# Patient Record
Sex: Male | Born: 1935 | Race: Black or African American | Hispanic: No | Smoking: Former smoker
Health system: Southern US, Community
[De-identification: ages and names within clinical notes are randomized; demographics above are authoritative.]

## PROBLEM LIST (undated history)

## (undated) DIAGNOSIS — K219 Gastro-esophageal reflux disease without esophagitis: Secondary | ICD-10-CM

## (undated) DIAGNOSIS — M109 Gout, unspecified: Secondary | ICD-10-CM

## (undated) DIAGNOSIS — H409 Unspecified glaucoma: Secondary | ICD-10-CM

## (undated) DIAGNOSIS — R55 Syncope and collapse: Secondary | ICD-10-CM

## (undated) DIAGNOSIS — J82 Pulmonary eosinophilia, not elsewhere classified: Secondary | ICD-10-CM

## (undated) DIAGNOSIS — I69398 Other sequelae of cerebral infarction: Secondary | ICD-10-CM

## (undated) DIAGNOSIS — E785 Hyperlipidemia, unspecified: Secondary | ICD-10-CM

## (undated) DIAGNOSIS — I2510 Atherosclerotic heart disease of native coronary artery without angina pectoris: Secondary | ICD-10-CM

## (undated) DIAGNOSIS — R269 Unspecified abnormalities of gait and mobility: Secondary | ICD-10-CM

## (undated) DIAGNOSIS — G5732 Lesion of lateral popliteal nerve, left lower limb: Secondary | ICD-10-CM

## (undated) DIAGNOSIS — I472 Ventricular tachycardia: Secondary | ICD-10-CM

## (undated) DIAGNOSIS — G5702 Lesion of sciatic nerve, left lower limb: Secondary | ICD-10-CM

## (undated) DIAGNOSIS — I10 Essential (primary) hypertension: Secondary | ICD-10-CM

## (undated) DIAGNOSIS — I69359 Hemiplegia and hemiparesis following cerebral infarction affecting unspecified side: Secondary | ICD-10-CM

## (undated) DIAGNOSIS — I426 Alcoholic cardiomyopathy: Secondary | ICD-10-CM

## (undated) DIAGNOSIS — I4891 Unspecified atrial fibrillation: Secondary | ICD-10-CM

## (undated) DIAGNOSIS — J449 Chronic obstructive pulmonary disease, unspecified: Secondary | ICD-10-CM

## (undated) DIAGNOSIS — I4729 Other ventricular tachycardia: Secondary | ICD-10-CM

## (undated) DIAGNOSIS — R001 Bradycardia, unspecified: Secondary | ICD-10-CM

## (undated) DIAGNOSIS — I480 Paroxysmal atrial fibrillation: Secondary | ICD-10-CM

## (undated) DIAGNOSIS — I509 Heart failure, unspecified: Secondary | ICD-10-CM

## (undated) DIAGNOSIS — I4821 Permanent atrial fibrillation: Secondary | ICD-10-CM

## (undated) DIAGNOSIS — I639 Cerebral infarction, unspecified: Secondary | ICD-10-CM

## (undated) DIAGNOSIS — I5022 Chronic systolic (congestive) heart failure: Secondary | ICD-10-CM

## (undated) DIAGNOSIS — J8289 Other pulmonary eosinophilia, not elsewhere classified: Secondary | ICD-10-CM

## (undated) DIAGNOSIS — M21372 Foot drop, left foot: Principal | ICD-10-CM

## (undated) DIAGNOSIS — Z7901 Long term (current) use of anticoagulants: Secondary | ICD-10-CM

## (undated) HISTORY — DX: Foot drop, left foot: M21.372

## (undated) HISTORY — DX: Gastro-esophageal reflux disease without esophagitis: K21.9

## (undated) HISTORY — DX: Heart failure, unspecified: I50.9

## (undated) HISTORY — DX: Atherosclerotic heart disease of native coronary artery without angina pectoris: I25.10

## (undated) HISTORY — DX: Unspecified glaucoma: H40.9

## (undated) HISTORY — DX: Essential (primary) hypertension: I10

## (undated) HISTORY — DX: Bradycardia, unspecified: R00.1

## (undated) HISTORY — DX: Other ventricular tachycardia: I47.29

## (undated) HISTORY — DX: Permanent atrial fibrillation: I48.21

## (undated) HISTORY — DX: Syncope and collapse: R55

## (undated) HISTORY — DX: Unspecified atrial fibrillation: I48.91

## (undated) HISTORY — DX: Ventricular tachycardia: I47.2

## (undated) HISTORY — DX: Other pulmonary eosinophilia, not elsewhere classified: J82.89

## (undated) HISTORY — DX: Lesion of lateral popliteal nerve, left lower limb: G57.32

## (undated) HISTORY — DX: Lesion of sciatic nerve, left lower limb: G57.02

## (undated) HISTORY — PX: COLONOSCOPY: SHX174

## (undated) HISTORY — DX: Long term (current) use of anticoagulants: Z79.01

## (undated) HISTORY — DX: Other sequelae of cerebral infarction: I69.398

## (undated) HISTORY — DX: Chronic systolic (congestive) heart failure: I50.22

## (undated) HISTORY — DX: Chronic obstructive pulmonary disease, unspecified: J44.9

## (undated) HISTORY — DX: Alcoholic cardiomyopathy: I42.6

## (undated) HISTORY — PX: KNEE SURGERY: SHX244

## (undated) HISTORY — DX: Hemiplegia and hemiparesis following cerebral infarction affecting unspecified side: I69.359

## (undated) HISTORY — DX: Cerebral infarction, unspecified: I63.9

## (undated) HISTORY — DX: Pulmonary eosinophilia, not elsewhere classified: J82

## (undated) HISTORY — DX: Unspecified abnormalities of gait and mobility: R26.9

## (undated) HISTORY — DX: Hyperlipidemia, unspecified: E78.5

---
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
## 2001-09-27 ENCOUNTER — Encounter: Payer: Self-pay | Admitting: Pulmonary Disease

## 2001-09-27 ENCOUNTER — Encounter: Payer: Self-pay | Admitting: Emergency Medicine

## 2001-09-27 ENCOUNTER — Encounter: Payer: Self-pay | Admitting: Neurology

## 2001-09-27 ENCOUNTER — Inpatient Hospital Stay (HOSPITAL_COMMUNITY): Admission: EM | Admit: 2001-09-27 | Discharge: 2001-10-10 | Payer: Self-pay | Admitting: Emergency Medicine

## 2001-09-28 ENCOUNTER — Encounter: Payer: Self-pay | Admitting: Neurology

## 2001-10-02 ENCOUNTER — Encounter: Payer: Self-pay | Admitting: Neurology

## 2001-10-08 ENCOUNTER — Encounter: Payer: Self-pay | Admitting: Neurology

## 2001-10-09 ENCOUNTER — Encounter: Payer: Self-pay | Admitting: Neurology

## 2001-10-10 ENCOUNTER — Inpatient Hospital Stay (HOSPITAL_COMMUNITY)
Admission: RE | Admit: 2001-10-10 | Discharge: 2001-10-26 | Payer: Self-pay | Admitting: Physical Medicine & Rehabilitation

## 2001-10-18 ENCOUNTER — Encounter: Payer: Self-pay | Admitting: Physical Medicine & Rehabilitation

## 2001-10-31 ENCOUNTER — Encounter
Admission: RE | Admit: 2001-10-31 | Discharge: 2001-11-26 | Payer: Self-pay | Admitting: Physical Medicine & Rehabilitation

## 2001-11-21 ENCOUNTER — Encounter: Payer: Self-pay | Admitting: *Deleted

## 2001-11-21 ENCOUNTER — Inpatient Hospital Stay (HOSPITAL_COMMUNITY): Admission: EM | Admit: 2001-11-21 | Discharge: 2001-11-28 | Payer: Self-pay | Admitting: *Deleted

## 2002-02-12 ENCOUNTER — Encounter: Payer: Self-pay | Admitting: Emergency Medicine

## 2002-02-12 ENCOUNTER — Inpatient Hospital Stay (HOSPITAL_COMMUNITY): Admission: EM | Admit: 2002-02-12 | Discharge: 2002-02-17 | Payer: Self-pay | Admitting: Emergency Medicine

## 2002-03-06 ENCOUNTER — Emergency Department (HOSPITAL_COMMUNITY): Admission: EM | Admit: 2002-03-06 | Discharge: 2002-03-06 | Payer: Self-pay | Admitting: Emergency Medicine

## 2002-03-06 ENCOUNTER — Encounter: Payer: Self-pay | Admitting: Emergency Medicine

## 2002-12-28 ENCOUNTER — Encounter: Payer: Self-pay | Admitting: Emergency Medicine

## 2002-12-28 ENCOUNTER — Inpatient Hospital Stay (HOSPITAL_COMMUNITY): Admission: EM | Admit: 2002-12-28 | Discharge: 2003-01-03 | Payer: Self-pay | Admitting: Emergency Medicine

## 2003-02-20 ENCOUNTER — Emergency Department (HOSPITAL_COMMUNITY): Admission: EM | Admit: 2003-02-20 | Discharge: 2003-02-20 | Payer: Self-pay | Admitting: Emergency Medicine

## 2003-02-20 ENCOUNTER — Encounter: Payer: Self-pay | Admitting: Emergency Medicine

## 2003-11-18 ENCOUNTER — Encounter: Admission: RE | Admit: 2003-11-18 | Discharge: 2003-11-18 | Payer: Self-pay | Admitting: Family Medicine

## 2003-11-20 ENCOUNTER — Emergency Department (HOSPITAL_COMMUNITY): Admission: EM | Admit: 2003-11-20 | Discharge: 2003-11-20 | Payer: Self-pay | Admitting: Emergency Medicine

## 2003-11-28 ENCOUNTER — Encounter: Admission: RE | Admit: 2003-11-28 | Discharge: 2003-12-11 | Payer: Self-pay | Admitting: Family Medicine

## 2003-12-05 ENCOUNTER — Emergency Department (HOSPITAL_COMMUNITY): Admission: EM | Admit: 2003-12-05 | Discharge: 2003-12-06 | Payer: Self-pay | Admitting: *Deleted

## 2004-01-23 ENCOUNTER — Inpatient Hospital Stay (HOSPITAL_COMMUNITY): Admission: EM | Admit: 2004-01-23 | Discharge: 2004-01-25 | Payer: Self-pay | Admitting: Emergency Medicine

## 2004-05-24 ENCOUNTER — Emergency Department (HOSPITAL_COMMUNITY): Admission: EM | Admit: 2004-05-24 | Discharge: 2004-05-24 | Payer: Self-pay | Admitting: Emergency Medicine

## 2004-07-07 ENCOUNTER — Ambulatory Visit: Payer: Self-pay | Admitting: Cardiology

## 2004-07-21 ENCOUNTER — Ambulatory Visit: Payer: Self-pay | Admitting: Cardiology

## 2004-07-27 ENCOUNTER — Ambulatory Visit: Payer: Self-pay | Admitting: Cardiology

## 2004-08-04 ENCOUNTER — Ambulatory Visit: Payer: Self-pay | Admitting: *Deleted

## 2004-08-25 ENCOUNTER — Ambulatory Visit: Payer: Self-pay | Admitting: Internal Medicine

## 2004-09-15 ENCOUNTER — Ambulatory Visit: Payer: Self-pay | Admitting: *Deleted

## 2004-10-13 ENCOUNTER — Ambulatory Visit: Payer: Self-pay | Admitting: Cardiovascular Disease

## 2004-10-27 ENCOUNTER — Ambulatory Visit: Payer: Self-pay | Admitting: Cardiology

## 2004-11-10 ENCOUNTER — Ambulatory Visit: Payer: Self-pay | Admitting: *Deleted

## 2004-11-17 ENCOUNTER — Ambulatory Visit: Payer: Self-pay | Admitting: Cardiology

## 2004-12-01 ENCOUNTER — Ambulatory Visit: Payer: Self-pay | Admitting: Cardiology

## 2004-12-15 ENCOUNTER — Ambulatory Visit: Payer: Self-pay | Admitting: Internal Medicine

## 2005-01-05 ENCOUNTER — Ambulatory Visit: Payer: Self-pay | Admitting: Cardiology

## 2005-02-02 ENCOUNTER — Ambulatory Visit: Payer: Self-pay | Admitting: Cardiovascular Disease

## 2005-02-23 ENCOUNTER — Ambulatory Visit: Payer: Self-pay | Admitting: Cardiology

## 2005-03-13 ENCOUNTER — Inpatient Hospital Stay (HOSPITAL_COMMUNITY): Admission: EM | Admit: 2005-03-13 | Discharge: 2005-03-17 | Payer: Self-pay | Admitting: Emergency Medicine

## 2005-03-13 ENCOUNTER — Ambulatory Visit: Payer: Self-pay | Admitting: Cardiology

## 2005-03-22 ENCOUNTER — Ambulatory Visit: Payer: Self-pay | Admitting: Cardiology

## 2005-03-29 ENCOUNTER — Ambulatory Visit: Payer: Self-pay | Admitting: Internal Medicine

## 2005-03-30 ENCOUNTER — Ambulatory Visit: Payer: Self-pay | Admitting: Cardiology

## 2005-04-13 ENCOUNTER — Ambulatory Visit: Payer: Self-pay | Admitting: Cardiology

## 2005-04-27 ENCOUNTER — Ambulatory Visit: Payer: Self-pay | Admitting: *Deleted

## 2005-05-11 ENCOUNTER — Ambulatory Visit: Payer: Self-pay | Admitting: Cardiology

## 2005-05-26 ENCOUNTER — Ambulatory Visit: Payer: Self-pay | Admitting: Cardiology

## 2005-06-23 ENCOUNTER — Ambulatory Visit: Payer: Self-pay | Admitting: Internal Medicine

## 2005-07-06 ENCOUNTER — Ambulatory Visit: Payer: Self-pay | Admitting: Cardiology

## 2005-07-20 ENCOUNTER — Ambulatory Visit: Payer: Self-pay | Admitting: Cardiology

## 2005-08-04 ENCOUNTER — Ambulatory Visit: Payer: Self-pay | Admitting: Cardiology

## 2005-08-25 ENCOUNTER — Ambulatory Visit: Payer: Self-pay | Admitting: *Deleted

## 2005-09-23 ENCOUNTER — Ambulatory Visit: Payer: Self-pay | Admitting: Cardiology

## 2005-09-29 ENCOUNTER — Ambulatory Visit: Payer: Self-pay | Admitting: Cardiology

## 2005-10-13 ENCOUNTER — Ambulatory Visit: Payer: Self-pay | Admitting: Internal Medicine

## 2005-10-25 ENCOUNTER — Ambulatory Visit: Payer: Self-pay | Admitting: Cardiology

## 2005-10-27 ENCOUNTER — Ambulatory Visit: Payer: Self-pay | Admitting: Internal Medicine

## 2005-11-11 ENCOUNTER — Ambulatory Visit: Payer: Self-pay | Admitting: Internal Medicine

## 2005-11-25 ENCOUNTER — Ambulatory Visit: Payer: Self-pay | Admitting: Cardiology

## 2005-12-09 ENCOUNTER — Ambulatory Visit: Payer: Self-pay | Admitting: Internal Medicine

## 2005-12-30 ENCOUNTER — Ambulatory Visit: Payer: Self-pay | Admitting: Cardiology

## 2006-01-27 ENCOUNTER — Ambulatory Visit: Payer: Self-pay | Admitting: Cardiology

## 2006-02-23 ENCOUNTER — Ambulatory Visit: Payer: Self-pay | Admitting: Cardiology

## 2006-03-16 ENCOUNTER — Ambulatory Visit: Payer: Self-pay | Admitting: Cardiology

## 2006-03-29 ENCOUNTER — Ambulatory Visit: Payer: Self-pay | Admitting: Cardiology

## 2006-04-13 ENCOUNTER — Ambulatory Visit: Payer: Self-pay | Admitting: Cardiology

## 2006-04-27 ENCOUNTER — Ambulatory Visit: Payer: Self-pay | Admitting: Cardiology

## 2006-05-11 ENCOUNTER — Ambulatory Visit: Payer: Self-pay | Admitting: Cardiology

## 2006-05-25 ENCOUNTER — Ambulatory Visit: Payer: Self-pay | Admitting: Cardiology

## 2006-06-15 ENCOUNTER — Ambulatory Visit: Payer: Self-pay | Admitting: Internal Medicine

## 2006-06-22 ENCOUNTER — Ambulatory Visit: Payer: Self-pay | Admitting: Cardiology

## 2006-07-05 ENCOUNTER — Ambulatory Visit: Payer: Self-pay | Admitting: Cardiology

## 2006-07-19 ENCOUNTER — Ambulatory Visit: Payer: Self-pay | Admitting: Cardiology

## 2006-08-04 ENCOUNTER — Ambulatory Visit: Payer: Self-pay | Admitting: Cardiology

## 2006-08-08 ENCOUNTER — Emergency Department (HOSPITAL_COMMUNITY): Admission: EM | Admit: 2006-08-08 | Discharge: 2006-08-08 | Payer: Self-pay | Admitting: Emergency Medicine

## 2006-08-18 ENCOUNTER — Ambulatory Visit: Payer: Self-pay | Admitting: Cardiovascular Disease

## 2006-09-11 ENCOUNTER — Ambulatory Visit: Payer: Self-pay | Admitting: Internal Medicine

## 2006-10-06 ENCOUNTER — Emergency Department (HOSPITAL_COMMUNITY): Admission: EM | Admit: 2006-10-06 | Discharge: 2006-10-07 | Payer: Self-pay | Admitting: Emergency Medicine

## 2006-10-10 ENCOUNTER — Ambulatory Visit: Payer: Self-pay | Admitting: Cardiology

## 2006-10-13 ENCOUNTER — Emergency Department (HOSPITAL_COMMUNITY): Admission: EM | Admit: 2006-10-13 | Discharge: 2006-10-13 | Payer: Self-pay | Admitting: Emergency Medicine

## 2006-11-07 ENCOUNTER — Ambulatory Visit: Payer: Self-pay | Admitting: Cardiology

## 2006-11-09 ENCOUNTER — Encounter: Admission: RE | Admit: 2006-11-09 | Discharge: 2006-11-09 | Payer: Self-pay | Admitting: Family Medicine

## 2006-11-14 ENCOUNTER — Encounter: Admission: RE | Admit: 2006-11-14 | Discharge: 2006-11-14 | Payer: Self-pay | Admitting: Family Medicine

## 2006-11-16 ENCOUNTER — Ambulatory Visit: Payer: Self-pay | Admitting: Internal Medicine

## 2006-12-05 ENCOUNTER — Ambulatory Visit: Payer: Self-pay | Admitting: Cardiology

## 2006-12-20 ENCOUNTER — Ambulatory Visit: Payer: Self-pay | Admitting: Internal Medicine

## 2007-01-02 ENCOUNTER — Ambulatory Visit: Payer: Self-pay | Admitting: Cardiology

## 2007-01-26 ENCOUNTER — Ambulatory Visit: Payer: Self-pay | Admitting: Cardiology

## 2007-02-23 ENCOUNTER — Ambulatory Visit: Payer: Self-pay | Admitting: Cardiology

## 2007-03-06 ENCOUNTER — Ambulatory Visit: Payer: Self-pay | Admitting: Cardiology

## 2007-03-20 ENCOUNTER — Ambulatory Visit: Payer: Self-pay | Admitting: Internal Medicine

## 2007-03-30 ENCOUNTER — Ambulatory Visit: Payer: Self-pay | Admitting: Cardiology

## 2007-04-04 ENCOUNTER — Ambulatory Visit: Payer: Self-pay | Admitting: Cardiology

## 2007-04-18 ENCOUNTER — Ambulatory Visit: Payer: Self-pay | Admitting: Cardiology

## 2007-05-04 ENCOUNTER — Ambulatory Visit: Payer: Self-pay

## 2007-05-04 ENCOUNTER — Ambulatory Visit: Payer: Self-pay | Admitting: Cardiology

## 2007-05-04 ENCOUNTER — Encounter: Payer: Self-pay | Admitting: Cardiology

## 2007-05-04 LAB — CONVERTED CEMR LAB
ALT: 13 units/L (ref 0–53)
AST: 17 units/L (ref 0–37)
Albumin: 3.8 g/dL (ref 3.5–5.2)
Alkaline Phosphatase: 59 units/L (ref 39–117)
BUN: 12 mg/dL (ref 6–23)
Bilirubin, Direct: 0.1 mg/dL (ref 0.0–0.3)
CO2: 30 meq/L (ref 19–32)
Calcium: 8.8 mg/dL (ref 8.4–10.5)
Chloride: 103 meq/L (ref 96–112)
Cholesterol: 155 mg/dL (ref 0–200)
Creatinine, Ser: 1.1 mg/dL (ref 0.4–1.5)
GFR calc Af Amer: 85 mL/min
GFR calc non Af Amer: 71 mL/min
Glucose, Bld: 110 mg/dL — ABNORMAL HIGH (ref 70–99)
HDL: 48.5 mg/dL (ref >39.0)
LDL Cholesterol: 90 mg/dL (ref 0–99)
Potassium: 4.3 meq/L (ref 3.5–5.1)
Sodium: 138 meq/L (ref 135–145)
Total Bilirubin: 1.2 mg/dL (ref 0.3–1.2)
Total CHOL/HDL Ratio: 3.2
Total Protein: 6.9 g/dL (ref 6.0–8.3)
Triglycerides: 84 mg/dL (ref 0–149)
VLDL: 17 mg/dL (ref 0–40)

## 2007-05-09 ENCOUNTER — Ambulatory Visit: Payer: Self-pay | Admitting: Cardiology

## 2007-05-17 ENCOUNTER — Ambulatory Visit: Payer: Self-pay | Admitting: Cardiology

## 2007-05-17 ENCOUNTER — Observation Stay (HOSPITAL_COMMUNITY): Admission: EM | Admit: 2007-05-17 | Discharge: 2007-05-18 | Payer: Self-pay | Admitting: Emergency Medicine

## 2007-05-23 ENCOUNTER — Ambulatory Visit: Payer: Self-pay | Admitting: Internal Medicine

## 2007-05-23 ENCOUNTER — Ambulatory Visit: Payer: Self-pay | Admitting: Cardiology

## 2007-06-08 ENCOUNTER — Ambulatory Visit: Payer: Self-pay | Admitting: Cardiology

## 2007-07-04 ENCOUNTER — Ambulatory Visit: Payer: Self-pay | Admitting: Cardiology

## 2007-07-18 ENCOUNTER — Ambulatory Visit: Payer: Self-pay | Admitting: Cardiology

## 2007-08-14 ENCOUNTER — Ambulatory Visit: Payer: Self-pay | Admitting: Internal Medicine

## 2007-09-11 ENCOUNTER — Ambulatory Visit: Payer: Self-pay | Admitting: Cardiology

## 2007-09-30 HISTORY — PX: OTHER SURGICAL HISTORY: SHX169

## 2007-10-09 ENCOUNTER — Ambulatory Visit: Payer: Self-pay | Admitting: Internal Medicine

## 2007-10-17 ENCOUNTER — Ambulatory Visit: Payer: Self-pay | Admitting: Cardiology

## 2007-10-17 ENCOUNTER — Inpatient Hospital Stay (HOSPITAL_COMMUNITY): Admission: EM | Admit: 2007-10-17 | Discharge: 2007-10-19 | Payer: Self-pay | Admitting: Emergency Medicine

## 2007-10-18 ENCOUNTER — Ambulatory Visit: Payer: Self-pay | Admitting: Vascular Surgery

## 2007-10-18 ENCOUNTER — Encounter: Payer: Self-pay | Admitting: Cardiology

## 2007-11-05 ENCOUNTER — Ambulatory Visit: Payer: Self-pay

## 2007-11-06 ENCOUNTER — Ambulatory Visit: Payer: Self-pay | Admitting: Cardiovascular Disease

## 2007-11-23 ENCOUNTER — Ambulatory Visit: Payer: Self-pay | Admitting: Cardiology

## 2007-11-27 ENCOUNTER — Ambulatory Visit: Payer: Self-pay | Admitting: Cardiovascular Disease

## 2007-12-11 ENCOUNTER — Ambulatory Visit: Payer: Self-pay | Admitting: Cardiology

## 2007-12-20 ENCOUNTER — Ambulatory Visit: Payer: Self-pay | Admitting: Internal Medicine

## 2007-12-26 ENCOUNTER — Telehealth: Payer: Self-pay | Admitting: Internal Medicine

## 2007-12-27 DIAGNOSIS — R05 Cough: Secondary | ICD-10-CM

## 2007-12-27 DIAGNOSIS — I2510 Atherosclerotic heart disease of native coronary artery without angina pectoris: Secondary | ICD-10-CM | POA: Insufficient documentation

## 2007-12-27 DIAGNOSIS — E785 Hyperlipidemia, unspecified: Secondary | ICD-10-CM | POA: Insufficient documentation

## 2007-12-27 DIAGNOSIS — I6350 Cerebral infarction due to unspecified occlusion or stenosis of unspecified cerebral artery: Secondary | ICD-10-CM | POA: Insufficient documentation

## 2007-12-27 DIAGNOSIS — I10 Essential (primary) hypertension: Secondary | ICD-10-CM | POA: Insufficient documentation

## 2007-12-27 DIAGNOSIS — I4819 Other persistent atrial fibrillation: Secondary | ICD-10-CM | POA: Insufficient documentation

## 2007-12-27 DIAGNOSIS — R059 Cough, unspecified: Secondary | ICD-10-CM | POA: Insufficient documentation

## 2007-12-27 DIAGNOSIS — I4891 Unspecified atrial fibrillation: Secondary | ICD-10-CM | POA: Insufficient documentation

## 2007-12-27 DIAGNOSIS — I426 Alcoholic cardiomyopathy: Secondary | ICD-10-CM | POA: Insufficient documentation

## 2007-12-28 ENCOUNTER — Ambulatory Visit: Payer: Self-pay | Admitting: Internal Medicine

## 2007-12-28 DIAGNOSIS — J8289 Other pulmonary eosinophilia, not elsewhere classified: Secondary | ICD-10-CM | POA: Insufficient documentation

## 2007-12-28 DIAGNOSIS — J82 Pulmonary eosinophilia, not elsewhere classified: Secondary | ICD-10-CM

## 2008-01-10 ENCOUNTER — Ambulatory Visit: Payer: Self-pay | Admitting: Cardiology

## 2008-02-05 ENCOUNTER — Ambulatory Visit: Payer: Self-pay | Admitting: Internal Medicine

## 2008-02-07 ENCOUNTER — Ambulatory Visit: Payer: Self-pay | Admitting: Cardiology

## 2008-02-21 ENCOUNTER — Ambulatory Visit: Payer: Self-pay | Admitting: Cardiology

## 2008-02-25 ENCOUNTER — Ambulatory Visit: Payer: Self-pay | Admitting: Cardiology

## 2008-02-28 ENCOUNTER — Ambulatory Visit: Payer: Self-pay | Admitting: Cardiology

## 2008-03-20 ENCOUNTER — Ambulatory Visit: Payer: Self-pay | Admitting: Cardiology

## 2008-04-10 ENCOUNTER — Ambulatory Visit: Payer: Self-pay | Admitting: Cardiology

## 2008-04-15 ENCOUNTER — Inpatient Hospital Stay (HOSPITAL_COMMUNITY): Admission: EM | Admit: 2008-04-15 | Discharge: 2008-04-21 | Payer: Self-pay | Admitting: Emergency Medicine

## 2008-04-15 ENCOUNTER — Ambulatory Visit: Payer: Self-pay | Admitting: Internal Medicine

## 2008-04-18 ENCOUNTER — Ambulatory Visit: Payer: Self-pay | Admitting: Physical Medicine & Rehabilitation

## 2008-04-21 ENCOUNTER — Ambulatory Visit: Payer: Self-pay | Admitting: Physical Medicine & Rehabilitation

## 2008-04-21 ENCOUNTER — Inpatient Hospital Stay (HOSPITAL_COMMUNITY)
Admission: RE | Admit: 2008-04-21 | Discharge: 2008-04-30 | Payer: Self-pay | Admitting: Physical Medicine & Rehabilitation

## 2008-05-19 ENCOUNTER — Ambulatory Visit: Payer: Self-pay | Admitting: Cardiovascular Disease

## 2008-05-21 ENCOUNTER — Ambulatory Visit: Payer: Self-pay

## 2008-05-21 ENCOUNTER — Ambulatory Visit: Payer: Self-pay | Admitting: Cardiology

## 2008-05-26 ENCOUNTER — Ambulatory Visit: Payer: Self-pay | Admitting: Cardiology

## 2008-05-26 LAB — CONVERTED CEMR LAB
BUN: 10 mg/dL (ref 6–23)
CO2: 28 meq/L (ref 19–32)
Calcium: 8.9 mg/dL (ref 8.4–10.5)
Chloride: 102 meq/L (ref 96–112)
Creatinine, Ser: 1 mg/dL (ref 0.4–1.5)
GFR calc Af Amer: 95 mL/min
GFR calc non Af Amer: 79 mL/min
Glucose, Bld: 90 mg/dL (ref 70–99)
Magnesium: 2.2 mg/dL (ref 1.5–2.5)
Potassium: 4 meq/L (ref 3.5–5.1)
Sodium: 141 meq/L (ref 135–145)
TSH: 1.05 microintl units/mL (ref 0.35–5.50)

## 2008-06-10 ENCOUNTER — Ambulatory Visit (HOSPITAL_COMMUNITY): Admission: RE | Admit: 2008-06-10 | Discharge: 2008-06-10 | Payer: Self-pay | Admitting: Cardiovascular Disease

## 2008-06-10 ENCOUNTER — Ambulatory Visit: Payer: Self-pay | Admitting: Cardiovascular Disease

## 2008-06-16 ENCOUNTER — Ambulatory Visit: Payer: Self-pay | Admitting: Internal Medicine

## 2008-06-25 ENCOUNTER — Ambulatory Visit: Payer: Self-pay | Admitting: Internal Medicine

## 2008-07-15 ENCOUNTER — Ambulatory Visit: Payer: Self-pay | Admitting: Cardiology

## 2008-07-17 ENCOUNTER — Encounter: Admission: RE | Admit: 2008-07-17 | Discharge: 2008-08-14 | Payer: Self-pay | Admitting: Orthopedic Surgery

## 2008-08-05 ENCOUNTER — Ambulatory Visit: Payer: Self-pay | Admitting: Internal Medicine

## 2008-08-19 ENCOUNTER — Ambulatory Visit: Payer: Self-pay | Admitting: Internal Medicine

## 2008-09-09 ENCOUNTER — Ambulatory Visit: Payer: Self-pay | Admitting: Cardiovascular Disease

## 2008-09-19 ENCOUNTER — Ambulatory Visit: Payer: Self-pay | Admitting: Internal Medicine

## 2008-09-22 IMAGING — CT CT HEAD W/O CM
1 of 2 series · 13 of 30 positions shown, 17 images · IV contrast (agent unspecified)
Comparison: 01/23/04

CLINICAL DATA: Vertigo and unsteady gait.  History of prior right cerebral infarction.
 HEAD CT WITHOUT CONTRAST:
TECHNIQUE: Contiguous axial images were obtained from the base of the skull through the vertex according to standard protocol without contrast.

[Series 2: brain · axial · 0.47mm/px · z∈[+124,+250]mm · 13 of 28 slices shown, 17 images]
[im 2/28  brain]
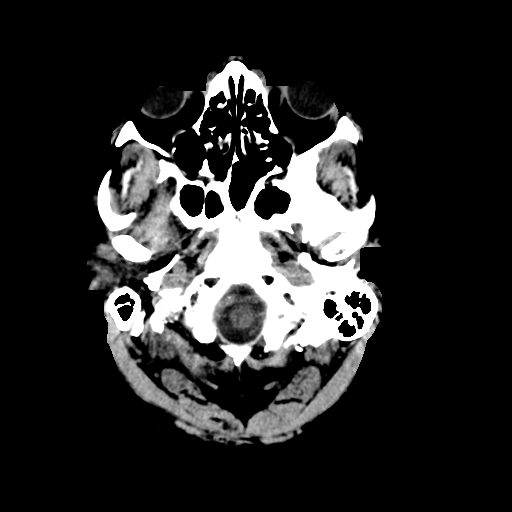
[im 2/28  bone]
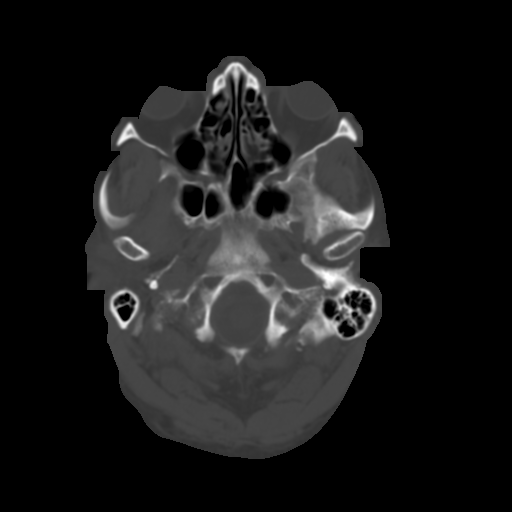
[im 4/28  brain]
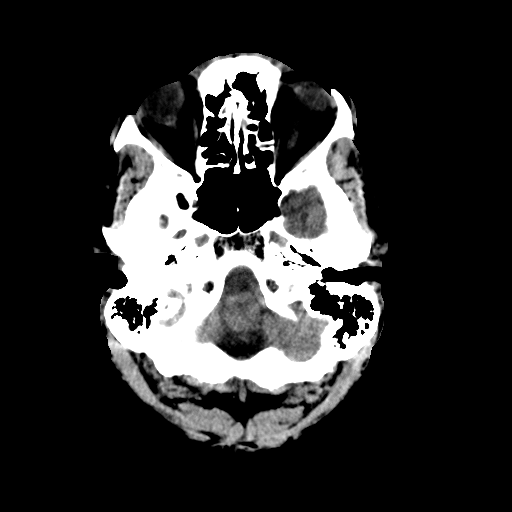
[im 6/28  brain]
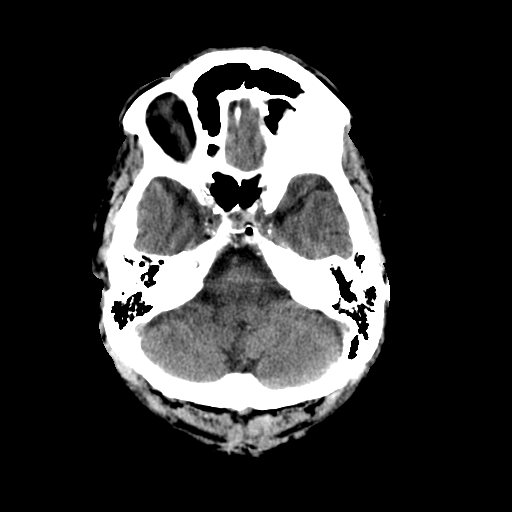
[im 8/28  brain]
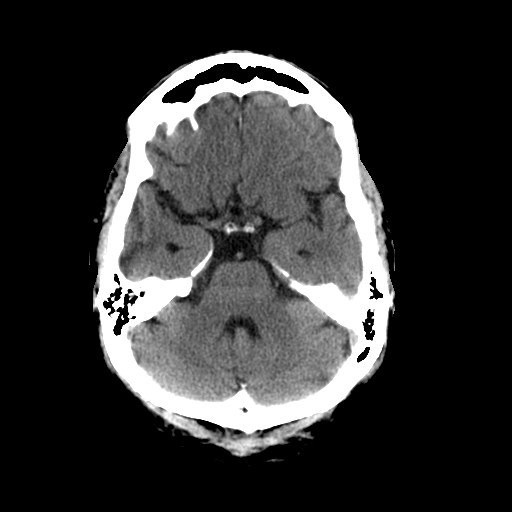
[im 10/28  brain]
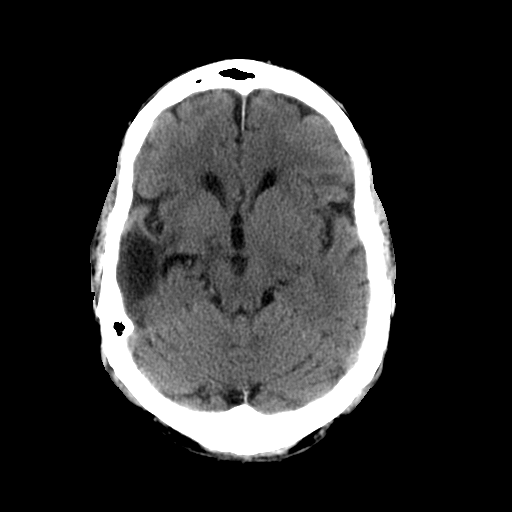
[im 10/28  bone]
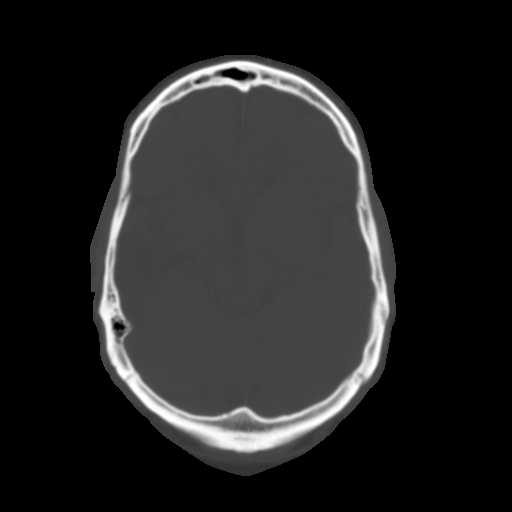
[im 12/28  brain]
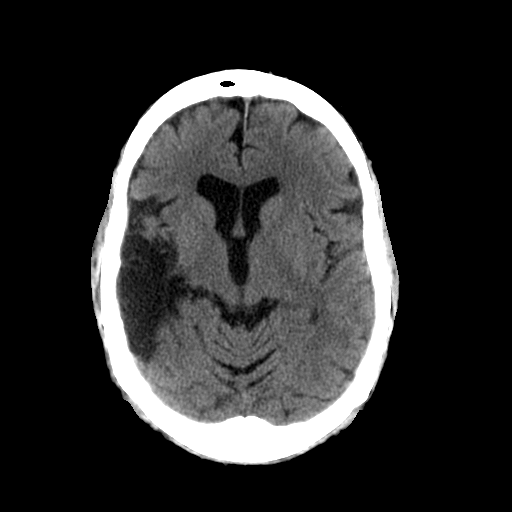
[im 14/28  brain]
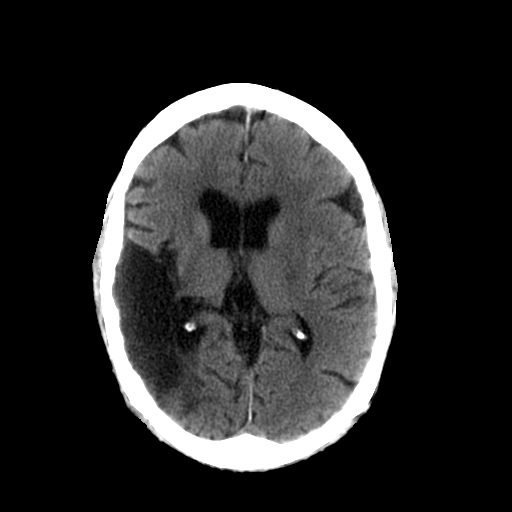
[im 16/28  brain]
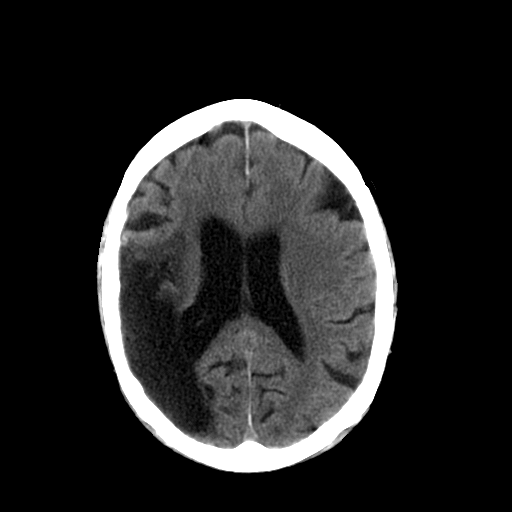
[im 18/28  brain]
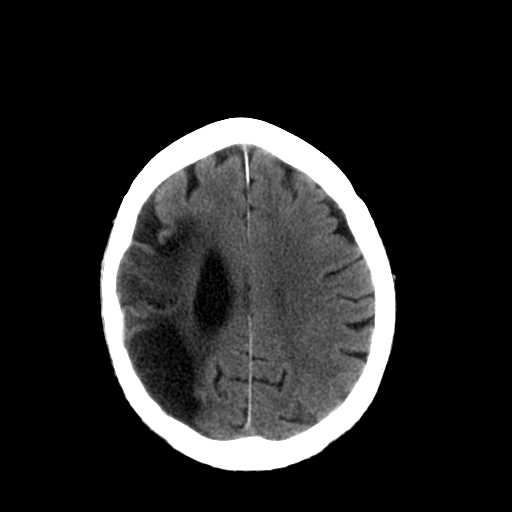
[im 18/28  bone]
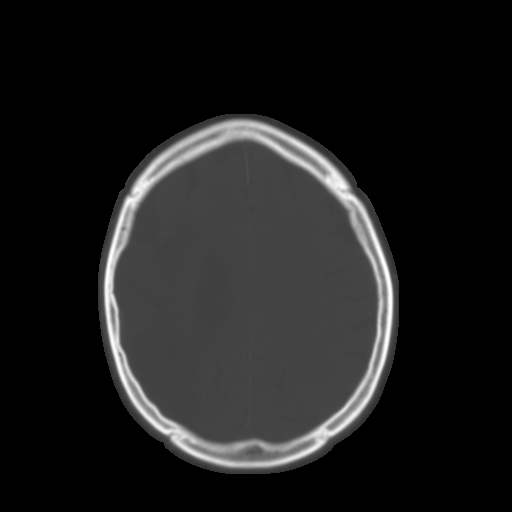
[im 20/28  brain]
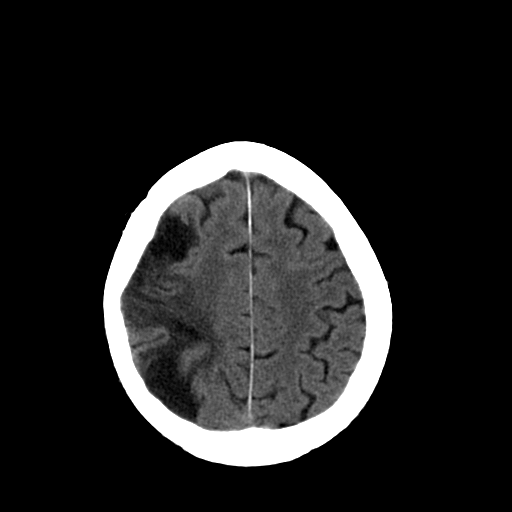
[im 22/28  brain]
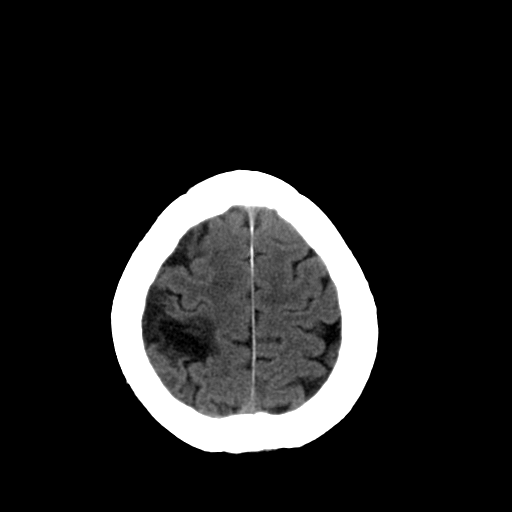
[im 24/28  brain]
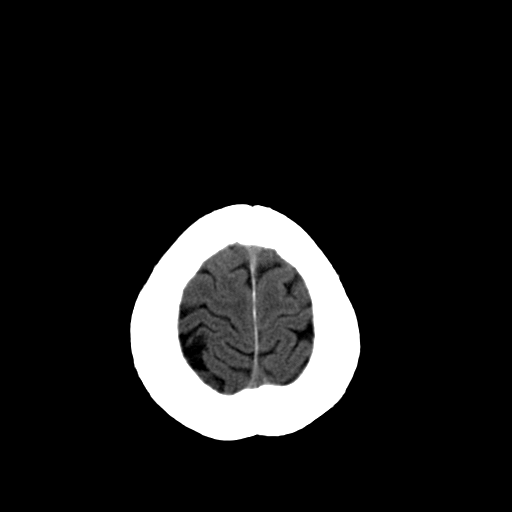
[im 26/28  brain]
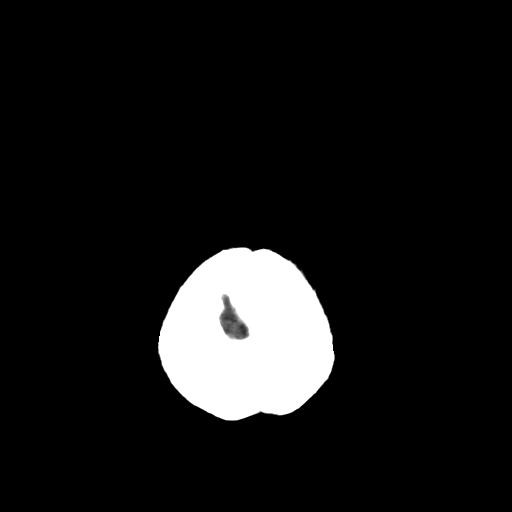
[im 26/28  bone]
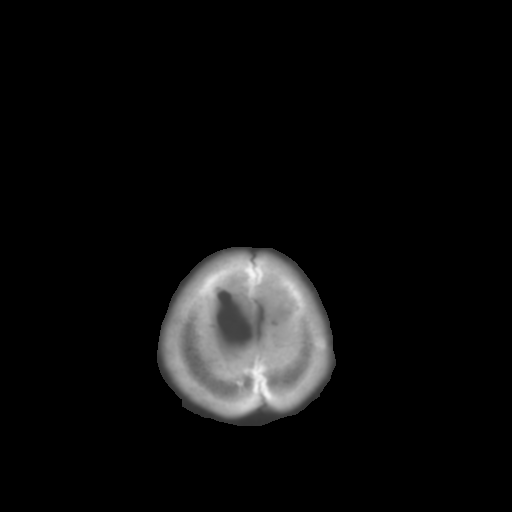

[13 of 30 positions shown; findings below may reference images not displayed]

FINDINGS: There is stable appearance to an old infarct with encephalomalacia seen throughout a large distribution of the right MCA territory involving frontal, parietal, and temporal cortex.  No new infarcts are seen.  No evidence of acute hemorrhage.  No mass effect.  No hydrocephalus.  No extra-axial fluid collections.  Bone windows show normal appearance to the skull.
IMPRESSION: Stable old large right MCA territory infarct.  No acute findings.

## 2008-09-29 ENCOUNTER — Ambulatory Visit: Payer: Self-pay | Admitting: Internal Medicine

## 2008-10-10 ENCOUNTER — Ambulatory Visit: Payer: Self-pay | Admitting: Internal Medicine

## 2008-10-31 ENCOUNTER — Ambulatory Visit: Payer: Self-pay | Admitting: Internal Medicine

## 2008-11-05 ENCOUNTER — Ambulatory Visit: Payer: Self-pay | Admitting: Cardiology

## 2008-11-19 ENCOUNTER — Ambulatory Visit: Payer: Self-pay | Admitting: Internal Medicine

## 2008-11-19 LAB — CONVERTED CEMR LAB
INR: 6.6 (ref 0.8–1.0)
Prothrombin Time: 64.5 s (ref 10.9–13.3)

## 2008-12-01 ENCOUNTER — Ambulatory Visit: Payer: Self-pay | Admitting: Cardiology

## 2008-12-09 ENCOUNTER — Encounter: Payer: Self-pay | Admitting: Cardiology

## 2008-12-09 ENCOUNTER — Ambulatory Visit: Payer: Self-pay | Admitting: Cardiology

## 2008-12-10 LAB — CONVERTED CEMR LAB
ALT: 16 units/L (ref 0–53)
AST: 22 units/L (ref 0–37)
Albumin: 4.1 g/dL (ref 3.5–5.2)
Alkaline Phosphatase: 70 units/L (ref 39–117)
BUN: 11 mg/dL (ref 6–23)
Bilirubin, Direct: 0.2 mg/dL (ref 0.0–0.3)
CO2: 30 meq/L (ref 19–32)
Calcium: 9 mg/dL (ref 8.4–10.5)
Chloride: 105 meq/L (ref 96–112)
Cholesterol: 183 mg/dL (ref 0–200)
Creatinine, Ser: 1.1 mg/dL (ref 0.4–1.5)
GFR calc non Af Amer: 84.92 mL/min (ref >60)
Glucose, Bld: 90 mg/dL (ref 70–99)
HDL: 52.4 mg/dL (ref >39.00)
LDL Cholesterol: 113 mg/dL — ABNORMAL HIGH (ref 0–99)
Potassium: 3.9 meq/L (ref 3.5–5.1)
Sodium: 141 meq/L (ref 135–145)
Total Bilirubin: 1.3 mg/dL — ABNORMAL HIGH (ref 0.3–1.2)
Total CHOL/HDL Ratio: 3
Total Protein: 7.4 g/dL (ref 6.0–8.3)
Triglycerides: 89 mg/dL (ref 0.0–149.0)
VLDL: 17.8 mg/dL (ref 0.0–40.0)

## 2008-12-30 ENCOUNTER — Ambulatory Visit: Payer: Self-pay | Admitting: Internal Medicine

## 2008-12-30 DIAGNOSIS — R55 Syncope and collapse: Secondary | ICD-10-CM | POA: Insufficient documentation

## 2008-12-30 DIAGNOSIS — I4729 Other ventricular tachycardia: Secondary | ICD-10-CM | POA: Insufficient documentation

## 2008-12-30 DIAGNOSIS — I472 Ventricular tachycardia: Secondary | ICD-10-CM | POA: Insufficient documentation

## 2009-01-20 ENCOUNTER — Ambulatory Visit: Payer: Self-pay | Admitting: Cardiology

## 2009-01-27 ENCOUNTER — Encounter: Payer: Self-pay | Admitting: *Deleted

## 2009-02-03 ENCOUNTER — Ambulatory Visit: Payer: Self-pay | Admitting: Cardiology

## 2009-02-03 LAB — CONVERTED CEMR LAB
POC INR: 3.1
Protime: 21.2

## 2009-02-17 ENCOUNTER — Ambulatory Visit: Payer: Self-pay | Admitting: Cardiovascular Disease

## 2009-02-17 LAB — CONVERTED CEMR LAB
POC INR: 3.1
Protime: 21.2

## 2009-03-04 ENCOUNTER — Encounter: Payer: Self-pay | Admitting: *Deleted

## 2009-03-10 ENCOUNTER — Encounter (INDEPENDENT_AMBULATORY_CARE_PROVIDER_SITE_OTHER): Payer: Self-pay | Admitting: Cardiology

## 2009-03-10 ENCOUNTER — Ambulatory Visit: Payer: Self-pay | Admitting: Cardiovascular Disease

## 2009-03-10 LAB — CONVERTED CEMR LAB
POC INR: 1.1
Prothrombin Time: 13.1 s

## 2009-03-19 ENCOUNTER — Ambulatory Visit: Payer: Self-pay | Admitting: Cardiovascular Disease

## 2009-03-19 LAB — CONVERTED CEMR LAB
POC INR: 1.2
Prothrombin Time: 13.8 s

## 2009-03-25 ENCOUNTER — Ambulatory Visit: Payer: Self-pay | Admitting: Cardiology

## 2009-03-25 LAB — CONVERTED CEMR LAB
POC INR: 2.3
Prothrombin Time: 18.5 s

## 2009-04-08 ENCOUNTER — Ambulatory Visit: Payer: Self-pay | Admitting: Cardiology

## 2009-04-08 LAB — CONVERTED CEMR LAB: POC INR: 3.2

## 2009-04-09 ENCOUNTER — Encounter (INDEPENDENT_AMBULATORY_CARE_PROVIDER_SITE_OTHER): Payer: Self-pay | Admitting: *Deleted

## 2009-04-22 ENCOUNTER — Ambulatory Visit: Payer: Self-pay | Admitting: Cardiovascular Disease

## 2009-05-01 ENCOUNTER — Telehealth (INDEPENDENT_AMBULATORY_CARE_PROVIDER_SITE_OTHER): Payer: Self-pay | Admitting: *Deleted

## 2009-05-13 ENCOUNTER — Ambulatory Visit: Payer: Self-pay | Admitting: Internal Medicine

## 2009-05-13 LAB — CONVERTED CEMR LAB: POC INR: 2.4

## 2009-06-09 ENCOUNTER — Ambulatory Visit: Payer: Self-pay | Admitting: Cardiology

## 2009-07-01 ENCOUNTER — Ambulatory Visit: Payer: Self-pay | Admitting: Cardiovascular Disease

## 2009-07-01 ENCOUNTER — Ambulatory Visit: Payer: Self-pay

## 2009-07-01 ENCOUNTER — Encounter: Payer: Self-pay | Admitting: Internal Medicine

## 2009-07-01 LAB — CONVERTED CEMR LAB: POC INR: 2.1

## 2009-07-22 ENCOUNTER — Ambulatory Visit: Payer: Self-pay | Admitting: Cardiology

## 2009-07-22 LAB — CONVERTED CEMR LAB: POC INR: 2.7

## 2009-08-18 ENCOUNTER — Ambulatory Visit: Payer: Self-pay | Admitting: Cardiology

## 2009-08-18 LAB — CONVERTED CEMR LAB: POC INR: 3.6

## 2009-09-02 ENCOUNTER — Ambulatory Visit: Payer: Self-pay | Admitting: Cardiovascular Disease

## 2009-09-02 LAB — CONVERTED CEMR LAB: POC INR: 1.7

## 2009-09-16 ENCOUNTER — Ambulatory Visit: Payer: Self-pay | Admitting: Cardiovascular Disease

## 2009-09-16 LAB — CONVERTED CEMR LAB: POC INR: 2.9

## 2009-10-06 ENCOUNTER — Ambulatory Visit: Payer: Self-pay | Admitting: Internal Medicine

## 2009-10-06 ENCOUNTER — Ambulatory Visit: Payer: Self-pay | Admitting: Cardiology

## 2009-10-06 LAB — CONVERTED CEMR LAB: POC INR: 2.3

## 2009-11-04 ENCOUNTER — Ambulatory Visit: Payer: Self-pay | Admitting: Cardiovascular Disease

## 2009-11-04 LAB — CONVERTED CEMR LAB: POC INR: 2.3

## 2009-11-27 ENCOUNTER — Telehealth: Payer: Self-pay | Admitting: Cardiology

## 2009-11-27 ENCOUNTER — Ambulatory Visit: Payer: Self-pay | Admitting: Cardiology

## 2009-12-02 ENCOUNTER — Ambulatory Visit: Payer: Self-pay | Admitting: Cardiology

## 2009-12-02 LAB — CONVERTED CEMR LAB: POC INR: 3.6

## 2009-12-08 ENCOUNTER — Encounter: Payer: Self-pay | Admitting: Internal Medicine

## 2009-12-08 LAB — CONVERTED CEMR LAB
ALT: 15 units/L (ref 0–53)
AST: 16 units/L (ref 0–37)
Albumin: 3.8 g/dL (ref 3.5–5.2)
Alkaline Phosphatase: 60 units/L (ref 39–117)
Bilirubin, Direct: 0 mg/dL (ref 0.0–0.3)
Cholesterol: 173 mg/dL (ref 0–200)
HDL: 51.7 mg/dL (ref >39.00)
LDL Cholesterol: 90 mg/dL (ref 0–99)
Total Bilirubin: 0.8 mg/dL (ref 0.3–1.2)
Total CHOL/HDL Ratio: 3
Total Protein: 7 g/dL (ref 6.0–8.3)
Triglycerides: 158 mg/dL — ABNORMAL HIGH (ref 0.0–149.0)
VLDL: 31.6 mg/dL (ref 0.0–40.0)

## 2009-12-15 ENCOUNTER — Ambulatory Visit: Payer: Self-pay | Admitting: Cardiovascular Disease

## 2009-12-17 ENCOUNTER — Telehealth (INDEPENDENT_AMBULATORY_CARE_PROVIDER_SITE_OTHER): Payer: Self-pay | Admitting: *Deleted

## 2009-12-28 ENCOUNTER — Ambulatory Visit: Payer: Self-pay

## 2009-12-28 ENCOUNTER — Encounter: Payer: Self-pay | Admitting: Internal Medicine

## 2010-01-12 ENCOUNTER — Ambulatory Visit: Payer: Self-pay | Admitting: Cardiology

## 2010-01-12 LAB — CONVERTED CEMR LAB: POC INR: 1.8

## 2010-01-20 ENCOUNTER — Telehealth: Payer: Self-pay | Admitting: Cardiology

## 2010-02-09 ENCOUNTER — Ambulatory Visit: Payer: Self-pay | Admitting: Internal Medicine

## 2010-02-09 LAB — CONVERTED CEMR LAB: POC INR: 2

## 2010-03-02 ENCOUNTER — Ambulatory Visit: Payer: Self-pay | Admitting: Cardiology

## 2010-03-02 ENCOUNTER — Ambulatory Visit: Payer: Self-pay | Admitting: Internal Medicine

## 2010-03-02 LAB — CONVERTED CEMR LAB: POC INR: 2.6

## 2010-03-05 ENCOUNTER — Ambulatory Visit: Payer: Self-pay | Admitting: Cardiology

## 2010-03-11 LAB — CONVERTED CEMR LAB
ALT: 12 units/L (ref 0–53)
AST: 17 units/L (ref 0–37)
Albumin: 4 g/dL (ref 3.5–5.2)
Alkaline Phosphatase: 61 units/L (ref 39–117)
Bilirubin, Direct: 0.2 mg/dL (ref 0.0–0.3)
Cholesterol: 199 mg/dL (ref 0–200)
HDL: 46.4 mg/dL (ref >39.00)
LDL Cholesterol: 130 mg/dL — ABNORMAL HIGH (ref 0–99)
Total Bilirubin: 1 mg/dL (ref 0.3–1.2)
Total CHOL/HDL Ratio: 4
Total Protein: 7.4 g/dL (ref 6.0–8.3)
Triglycerides: 113 mg/dL (ref 0.0–149.0)
VLDL: 22.6 mg/dL (ref 0.0–40.0)

## 2010-03-31 ENCOUNTER — Encounter: Payer: Self-pay | Admitting: Internal Medicine

## 2010-03-31 ENCOUNTER — Ambulatory Visit: Payer: Self-pay

## 2010-03-31 ENCOUNTER — Ambulatory Visit: Payer: Self-pay | Admitting: Cardiovascular Disease

## 2010-03-31 LAB — CONVERTED CEMR LAB: POC INR: 2.3

## 2010-04-13 ENCOUNTER — Ambulatory Visit: Payer: Self-pay | Admitting: Internal Medicine

## 2010-04-28 ENCOUNTER — Ambulatory Visit: Payer: Self-pay | Admitting: Cardiovascular Disease

## 2010-04-28 LAB — CONVERTED CEMR LAB: POC INR: 3.5

## 2010-04-29 ENCOUNTER — Telehealth: Payer: Self-pay | Admitting: Internal Medicine

## 2010-05-12 ENCOUNTER — Ambulatory Visit: Payer: Self-pay | Admitting: Internal Medicine

## 2010-05-12 LAB — CONVERTED CEMR LAB: POC INR: 2.3

## 2010-06-09 ENCOUNTER — Ambulatory Visit: Payer: Self-pay | Admitting: Cardiovascular Disease

## 2010-06-09 ENCOUNTER — Ambulatory Visit: Payer: Self-pay | Admitting: Cardiology

## 2010-06-09 DIAGNOSIS — I498 Other specified cardiac arrhythmias: Secondary | ICD-10-CM | POA: Insufficient documentation

## 2010-06-09 LAB — CONVERTED CEMR LAB: POC INR: 3.1

## 2010-06-30 ENCOUNTER — Ambulatory Visit: Payer: Self-pay | Admitting: Internal Medicine

## 2010-07-07 ENCOUNTER — Ambulatory Visit: Payer: Self-pay | Admitting: Cardiovascular Disease

## 2010-07-07 LAB — CONVERTED CEMR LAB: POC INR: 3.5

## 2010-07-26 ENCOUNTER — Ambulatory Visit: Payer: Self-pay | Admitting: Cardiology

## 2010-07-26 LAB — CONVERTED CEMR LAB: POC INR: 1.3

## 2010-08-02 ENCOUNTER — Ambulatory Visit: Payer: Self-pay | Admitting: Cardiovascular Disease

## 2010-08-02 LAB — CONVERTED CEMR LAB: POC INR: 2.5

## 2010-08-05 ENCOUNTER — Encounter (INDEPENDENT_AMBULATORY_CARE_PROVIDER_SITE_OTHER): Payer: Self-pay | Admitting: *Deleted

## 2010-08-17 ENCOUNTER — Ambulatory Visit: Payer: Self-pay | Admitting: Cardiology

## 2010-08-17 LAB — CONVERTED CEMR LAB: POC INR: 3

## 2010-09-13 ENCOUNTER — Ambulatory Visit: Admission: RE | Admit: 2010-09-13 | Discharge: 2010-09-13 | Payer: Self-pay | Source: Home / Self Care

## 2010-09-13 LAB — CONVERTED CEMR LAB: POC INR: 2.6

## 2010-09-17 ENCOUNTER — Encounter: Payer: Self-pay | Admitting: Cardiology

## 2010-09-17 ENCOUNTER — Ambulatory Visit
Admission: RE | Admit: 2010-09-17 | Discharge: 2010-09-17 | Payer: Self-pay | Source: Home / Self Care | Attending: Gastroenterology | Admitting: Gastroenterology

## 2010-09-17 ENCOUNTER — Encounter: Payer: Self-pay | Admitting: Gastroenterology

## 2010-09-17 DIAGNOSIS — H409 Unspecified glaucoma: Secondary | ICD-10-CM | POA: Insufficient documentation

## 2010-09-17 DIAGNOSIS — K625 Hemorrhage of anus and rectum: Secondary | ICD-10-CM | POA: Insufficient documentation

## 2010-09-17 DIAGNOSIS — K219 Gastro-esophageal reflux disease without esophagitis: Secondary | ICD-10-CM | POA: Insufficient documentation

## 2010-09-21 ENCOUNTER — Encounter: Payer: Self-pay | Admitting: Gastroenterology

## 2010-09-26 LAB — CONVERTED CEMR LAB: POC INR: 3.2

## 2010-09-28 NOTE — Progress Notes (Signed)
Summary: med question  lm to cb/pt rtn call  Phone Note Call from Patient Call back at Home Phone [phone]   Caller: Patient Reason for Call: Talk to Nurse Summary of Call: request call back about cholesterol meds Initial call taken by: Migdalia Dk,  Jan 20, 2010 3:56 PM  Follow-up for Phone Call        left message to call back  Sander Nephew, RN  pt rtn call from last week. pls call [phone] this is the number he will be at today Omer Jack  Jan 26, 2010 1:07 PM   Additional Follow-up for Phone Call Additional follow up Details #1::        talked with pt by telephone--he states he has started Simvastatin 01-19-10 and is calling to schedule fasting lab 6 weeks after starting Simvastatin--I will order fasting lipid/liver profile 03-02-10 per notes in EMR    New/Updated Medications: SIMVASTATIN 40 MG TABS (SIMVASTATIN) one in the evening

## 2010-09-28 NOTE — Procedures (Signed)
Summary: device check   Current Medications (verified): 1)  Cvs Loratadine 10 Mg  Tabs (Loratadine) .... Once Daily 2)  Pepcid 20 Mg  Tabs (Famotidine) .... Two Times A Day 3)  Adult Aspirin Low Strength 81 Mg  Tbdp (Aspirin) .... Once Daily 4)  Spironolactone 25 Mg  Tabs (Spironolactone) .... Once Daily 5)  Micardis 40 Mg  Tabs (Telmisartan) .... Once Daily 6)  Warfarin Sodium 2 Mg Tabs (Warfarin Sodium) .... Use As Directed By Anticoagulation Clinic 7)  Meclizine Hcl 25 Mg  Tabs (Meclizine Hcl) .... As Needed 8)  Nitrolingual 0.4 Mg/spray Soln (Nitroglycerin) .... One Spray Under Tongue Every 5 Minutes As Needed For Chest Pain---May Repeat Times Three 9)  Lumigan 0.03 % Soln (Bimatoprost) .Marland Kitchen.. 1 Drop Ou Every Evening 10)  Alphagan P 0.1 % Soln (Brimonidine Tartrate) .... Use 1 Drop in Right Eye Twice Daily. 11)  Simvastatin 40 Mg Tabs (Simvastatin) .... One in The Evening  Allergies (verified): No Known Drug Allergies   ILR Following MD Sherryl Manges, MD DOI:  10/19/2007 Vendor:  [employer] Jude     Model Number:  [id number]     Serial Number [id number]       Tachy Episodes:  31     Brady Episodes:  21 ILR Next Due 06/29/2010  Tech Comments:  31 TACHY EPISODES--ALL NOISE.  21 BRADY EPISODES--MOST EPISODES IN THE AM.  PT GETS UP AROUND 5AM MOST DAYS.  PT CAN FEEL SLOW HR SOME DAYS.  LOWEST HR 38bpm WITH THE LONGEST EPISODE BEING 1 MIN 9 SECONDS.  ROV IN 3 MTHS W/CLINIC. Vella Kohler  March 31, 2010 10:00 AM

## 2010-09-28 NOTE — Cardiovascular Report (Signed)
Summary: Office Visit   Office Visit   Imported By: Roderic Ovens 07/08/2010 14:34:20  _____________________________________________________________________  External Attachment:    Type:   Image     Comment:   External Document

## 2010-09-28 NOTE — Medication Information (Signed)
Summary: rov/cb  Anticoagulant Therapy  Managed by: Elaina Pattee, PharmD Referring MD: Valera Castle MD PCP: Dr. Reinaldo Raddle MD: Eden Emms MD, Theron Arista Indication 1: atrial fibrillation?? 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 no    Recent/future dental: no  Any missed doses?: no       Is patient compliant with meds? yes       Allergies: No Known Drug Allergies  Anticoagulation Management History:      The patient is taking warfarin and comes in today for a routine follow up visit.  Positive risk factors for bleeding include an age of 73 years or older and history of CVA/TIA.  The bleeding index is 'intermediate risk'.  Positive CHADS2 values include History of HTN and Prior Stroke/CVA/TIA.  Negative CHADS2 values include Age > 5 years old.  The start date was 10/22/2001.  His last INR was 6.6 ratio.  Anticoagulation responsible provider: Eden Emms MD, Theron Arista.  Cuvette Lot#: [id number].  Exp: 12/2010.    Anticoagulation Management Assessment/Plan:      The patient's current anticoagulation dose is Warfarin sodium 2 mg tabs: Use as directed by Anticoagulation Clinic.  The target INR is 2.0-3.0.  The next INR is due 01/12/2010.  Anticoagulation instructions were given to patient/sister.  Results were reviewed/authorized by Elaina Pattee, PharmD.  He was notified by Elaina Pattee, PharmD.         Prior Anticoagulation Instructions: INR 3.6. Hold Coumadin today, then continue to take 2 tablets daily except 1 tablet on Saturdays.  Recheck in 2 weeks.  Current Anticoagulation Instructions: INR 2.7. Take 2 tablets daily except 1 tablet on Saturdays.

## 2010-09-28 NOTE — Medication Information (Signed)
Summary: rov/ewj  Anticoagulant Therapy  Managed by: Bethena Midget, RN, BSN Referring MD: Valera Castle MD PCP: Dr. Reinaldo Raddle MD: Eden Emms MD, Theron Arista Indication 1: atrial fibrillation?? 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 no    Recent/future dental: no  Any missed doses?: no       Is patient compliant with meds? yes       Allergies: No Known Drug Allergies  Anticoagulation Management History:      The patient is taking warfarin and comes in today for a routine follow up visit.  Positive risk factors for bleeding include an age of 75 years or older and history of CVA/TIA.  The bleeding index is 'intermediate risk'.  Positive CHADS2 values include History of HTN and Prior Stroke/CVA/TIA.  Negative CHADS2 values include Age > 73 years old.  The start date was 10/22/2001.  His last INR was 6.6 ratio.  Anticoagulation responsible provider: Eden Emms MD, Theron Arista.  INR POC: 2.9.  Cuvette Lot#: [id number].  Exp: 11/2010.    Anticoagulation Management Assessment/Plan:      The patient's current anticoagulation dose is Warfarin sodium 2 mg tabs: Use as directed by Anticoagulation Clinic.  The target INR is 2.0-3.0.  The next INR is due 10/06/2009.  Anticoagulation instructions were given to patient/sister.  Results were reviewed/authorized by Bethena Midget, RN, BSN.  He was notified by Bethena Midget, RN, BSN.         Prior Anticoagulation Instructions: INR 1.7  Take 2 tablets today then resume same dosage 2 tablets daily except 1 tablet on Wednesdays.  Recheck in 2 weeks.    Current Anticoagulation Instructions: INR 2.9 Continue 4mg s everyday except 2mg s on Wednesdays. Recheck in 3 weeks.
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
## 2010-09-28 NOTE — Cardiovascular Report (Signed)
Summary: Office Visit   Office Visit   Imported By: Roderic Ovens 04/14/2010 11:33:47  _____________________________________________________________________  External Attachment:    Type:   Image     Comment:   External Document

## 2010-09-28 NOTE — Medication Information (Signed)
Summary: rov/tm  Anticoagulant Therapy  Managed by: Cloyde Reams, RN, BSN Referring MD: Valera Castle MD PCP: Dr. Reinaldo Raddle MD: Clifton James MD, Cristal Deer Indication 1: atrial fibrillation?? 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    Health status changes: no    Bleeding/hemorrhagic complications: no    Recent/future hospitalizations: no    Any changes in medication regimen? no    Recent/future dental: no  Any missed doses?: yes     Details: May have missed some coumadin, but tried to make up doses.    Is patient compliant with meds? yes       Allergies (verified): No Known Drug Allergies  Anticoagulation Management History:      The patient is taking warfarin and comes in today for a routine follow up visit.  Positive risk factors for bleeding include an age of 73 years or older and history of CVA/TIA.  The bleeding index is 'intermediate risk'.  Positive CHADS2 values include History of HTN and Prior Stroke/CVA/TIA.  Negative CHADS2 values include Age > 77 years old.  The start date was 10/22/2001.  His last INR was 6.6 ratio.  Anticoagulation responsible provider: Clifton James MD, Cristal Deer.  INR POC: 1.7.  Cuvette Lot#: [id number].  Exp: 09/2010.    Anticoagulation Management Assessment/Plan:      The patient's current anticoagulation dose is Warfarin sodium 2 mg tabs: Use as directed by Anticoagulation Clinic.  The target INR is 2.0-3.0.  The next INR is due 09/16/2009.  Anticoagulation instructions were given to patient.  Results were reviewed/authorized by Cloyde Reams, RN, BSN.  He was notified by Cloyde Reams RN.         Prior Anticoagulation Instructions: INR 3.6 Skip today then resume 4mg s daily except 2mg s on Wednesdays. Recheck in 2 weeks.   Current Anticoagulation Instructions: INR 1.7  Take 2 tablets  today then resume same dosage 2 tablets daily except 1 tablet on Wednesdays.  Recheck in 2 weeks.

## 2010-09-28 NOTE — Medication Information (Signed)
Summary: rov/sp  Anticoagulant Therapy  Managed by: Bethena Midget, RN, BSN Referring MD: Valera Castle MD PCP: Dr. Reinaldo Raddle MD: Gala Romney MD, Reuel Boom Indication 1: atrial fibrillation?? 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 no    Recent/future dental: no  Any missed doses?: no       Is patient compliant with meds? yes       Allergies: No Known Drug Allergies  Anticoagulation Management History:      The patient is taking warfarin and comes in today for a routine follow up visit.  Positive risk factors for bleeding include an age of 74 years or older and history of CVA/TIA.  The bleeding index is 'intermediate risk'.  Positive CHADS2 values include History of HTN and Prior Stroke/CVA/TIA.  Negative CHADS2 values include Age > 43 years old.  The start date was 10/22/2001.  His last INR was 6.6 ratio.  Anticoagulation responsible provider: Emelie Newsom MD, Reuel Boom.  INR POC: 2.6.  Cuvette Lot#: [id number].  Exp: 04/2011.    Anticoagulation Management Assessment/Plan:      The patient's current anticoagulation dose is Warfarin sodium 2 mg tabs: Use as directed by Anticoagulation Clinic.  The target INR is 2.0-3.0.  The next INR is due 03/31/2010.  Anticoagulation instructions were given to patient/sister.  Results were reviewed/authorized by Bethena Midget, RN, BSN.  He was notified by Bethena Midget, RN, BSN.         Prior Anticoagulation Instructions: INR 2.0  Continue same dose of 2 tablets every day except 1 tablet on Saturday.   Current Anticoagulation Instructions: INR 2.6 Continue 2 pills everyday except 1 pill on Saturdays. Recheck in 4 weeks.

## 2010-09-28 NOTE — Assessment & Plan Note (Signed)
Summary: per pt call/cb  Medications Added COUMADIN 4 MG  TABS (WARFARIN SODIUM) as directed MECLIZINE HCL 25 MG  TABS (MECLIZINE HCL) as needed        Visit Type:  Follow-up  Chief Complaint:  Pt here for followup.  Needs new rx for micardis.  He denies any complaints today.Marland Kitchen  History of Present Illness:  HISTORY:  This is a delightful 75 year old black male who states that he is much better since his previous visit when he was seen for an abnormal CT scan, which was done in turn for the evaluation of cough.  The cough is totally resolved off of ACE inhibitor and on Micardis 40 mg daily.  He denies any pleuritic or exertional chest pain, dyspnea, cough, fevers, chills, sweats, dyspnea, leg swelling, or unintended weight loss.     Current Allergies: No known allergies   Past Medical History:    Reviewed history from 12/27/2007 and no changes required:       Hyperlipidemia       Hypertension      Vital Signs:  Patient Profile:   75 Years Old Male Weight:      192 pounds O2 Sat:      97 % O2 treatment:    Room Air Temp:     98.3 degrees F rectal Pulse rate:   62 / minute BP sitting:   126 / 66  (left arm)  Vitals Entered By: Vernie Murders (Dec 28, 2007 4:18 PM)                 Physical Exam    Ambulatory healthy appearing in no acute distress. Afeb with normal vital signs HEENT: nl dentition, turbinates, and orophanx. Nl external ear canals without cough reflex Neck without JVD/Nodes/TM Lungs clear to A and P bilaterally without cough on insp or exp maneuvers RRR no s3 or murmur or increase in P2 Abd soft and benign with nl excursion in the supine position. No bruits or organomegaly Ext warm without calf tenderness, cyanosis clubbing or edema Skin warm and dry without lesions       Impression & Recommendations:  Problem # 1:  COUGH (ICD-786.2) Resolved off ace, continue micardis, refill per [facility]  Problem # 2:  PULMONARY INFILTRATE  INCLUDES (EOSINOPHILIA) (ICD-518.3) Cxr is nl, no further pulmonary fu needed Orders: Est. Patient Level III (16109)   Medications Added to Medication List This Visit: 1)  Coumadin 4 Mg Tabs (Warfarin sodium) .... As directed 2)  Meclizine Hcl 25 Mg Tabs (Meclizine hcl) .... As needed  Other Orders: T-2 View CXR, Same Day (71020.5TC)   Patient Instructions: 1)  Please schedule a follow-up appointment as needed - Dr Daleen Squibb or [name] can refill your Micardis and monitor your kidney function    ]

## 2010-09-28 NOTE — Medication Information (Signed)
Summary: rov/sl  Anticoagulant Therapy  Managed by: Weston Brass, PharmD Referring MD: Valera Castle MD PCP: Dr. Reinaldo Raddle MD: Tenny Craw MD, Gunnar Fusi Indication 1: atrial fibrillation?? 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 no    Recent/future dental: no  Any missed doses?: yes     Details: May have missed a few doses this past week  Is patient compliant with meds? yes       Allergies: No Known Drug Allergies  Anticoagulation Management History:      The patient is taking warfarin and comes in today for a routine follow up visit.  Positive risk factors for bleeding include an age of 23 years or older and history of CVA/TIA.  The bleeding index is 'intermediate risk'.  Positive CHADS2 values include History of HTN and Prior Stroke/CVA/TIA.  Negative CHADS2 values include Age > 76 years old.  The start date was 10/22/2001.  His last INR was 6.6 ratio.  Anticoagulation responsible provider: Tenny Craw MD, Gunnar Fusi.  INR POC: 1.3.  Cuvette Lot#: [id number].  Exp: 07/2011.    Anticoagulation Management Assessment/Plan:      The patient's current anticoagulation dose is Warfarin sodium 2 mg tabs: Use as directed by Anticoagulation Clinic.  The target INR is 2.0-3.0.  The next INR is due 08/02/2010.  Anticoagulation instructions were given to patient/sister.  Results were reviewed/authorized by Weston Brass, PharmD.  He was notified by Hoy Register, PharmD Candidate.         Prior Anticoagulation Instructions: INR 3.5  Take Coumadin 2 tabs (4 mg) on all days except Coumadin 1 tab (2 mg) on Wednesdays and Saturdays. Return to clinic in 2 weeks.    Current Anticoagulation Instructions: INR 1.3 Take 3 tablets today  and tomorrow then resume previous dose of 2 tablets everyday except 1 tablet on Wednesday and Saturday Recheck INR in 1 week

## 2010-09-28 NOTE — Cardiovascular Report (Signed)
Summary: Office Visit   Office Visit   Imported By: Roderic Ovens 01/12/2010 11:57:28  _____________________________________________________________________  External Attachment:    Type:   Image     Comment:   External Document

## 2010-09-28 NOTE — Assessment & Plan Note (Signed)
Summary: 6 mo f/u ./cy   Visit Type:  6 mo f/u Primary Provider:  Dr. [name]  CC:  chest pain...pt states he fell yesterday due to a dizzy spell said he chest started hurting  after the fal...no other complaints today.  History of Present Illness: [name] comes in today for evaluation of his coronary artery disease, history of atrial for relation, anticoagulation.  Yesterday morning, he got up and went to the bathroom to urinate. On the way back he got lightheaded and fell. He notes he has to strain with urination. He became nauseated and sweaty but denied any chest pain. He did not lose consciousness. This was not witnessed.  He did not see medical attention because he had an appointment today.  He's been feeling well otherwise. he specifically denies any chest discomfort consistent with angina or other ischemic equivalent symptoms. He denies any blood loss or melena. His appetite and weight have been stable.  He did not hit his head.  Current Medications (verified): 1)  Cvs Loratadine 10 Mg  Tabs (Loratadine) .... Once Daily 2)  Pepcid 20 Mg  Tabs (Famotidine) .... Two Times A Day 3)  Adult Aspirin Low Strength 81 Mg  Tbdp (Aspirin) .... Once Daily 4)  Spironolactone 25 Mg  Tabs (Spironolactone) .... Once Daily 5)  Micardis 40 Mg  Tabs (Telmisartan) .... Once Daily 6)  Warfarin Sodium 2 Mg Tabs (Warfarin Sodium) .... Use As Directed By Anticoagulation Clinic 7)  Meclizine Hcl 25 Mg  Tabs (Meclizine Hcl) .... As Needed 8)  Nitrolingual 0.4 Mg/spray Soln (Nitroglycerin) .... One Spray Under Tongue Every 5 Minutes As Needed For Chest Pain---May Repeat Times Three 9)  Lumigan 0.03 % Soln (Bimatoprost) .Marland Kitchen.. 1 Drop Ou Every Evening 10)  Alphagan P 0.1 % Soln (Brimonidine Tartrate) .... Use 1 Drop in Right Eye Twice Daily. 11)  Simvastatin 40 Mg Tabs (Simvastatin) .... One in The Evening 12)  Sudafed 30 Mg Tabs (Pseudoephedrine Hcl) .... As Needed  Allergies (verified): No Known Drug  Allergies  Past History:  Past Surgical History: Last updated: 12/02/2008 Drug-eluting stent May 2004 Loop recorder..10/19/07  Review of Systems       negative other than history of present illness  Vital Signs:  Patient profile:   75 year old male Height:      69 inches Weight:      191.12 pounds BMI:     28.33 Pulse rate:   56 / minute Pulse (ortho):   81 / minute Pulse rhythm:   irregular BP sitting:   110 / 60  (left arm) BP standing:   115 / 69 Cuff size:   large  Vitals Entered By: Danielle [name], CMA (June 09, 2010 10:14 AM)  Serial Vital Signs/Assessments:  Time      Position  BP       Pulse  Resp  Temp     By 10:58 AM  Lying LA  139/73   [address], [place] [address]      Sitting   119/72   71                    Lisabeth Devoid RN 105       Standing  115/69   81                    Lisabeth Devoid  RN 1058      Standing  131/71   86                    Lisabeth Devoid RN  Comments: 10:58 AM no sxms By: Danielle [name], CMA  1051 no complaints By: Lisabeth Devoid RN  [phone] c/o a little dizziness By: Lisabeth Devoid RN  [phone] c/o mild dizziness By: Lisabeth Devoid RN    Physical Exam  General:  obese.  no acute distress Head:  normocephalic and atraumatic Eyes:  PERRLA/EOM intact; conjunctiva and lids normal. Neck:  Neck supple, no JVD. No masses, thyromegaly or abnormal cervical nodes. Chest Wall:  no deformities or breast masses noted Lungs:  Clear bilaterally to auscultation and percussion. Heart:  PMI nondisplaced, regular rate and rhythm, normal S1-S2, no gallop or murmur. Carotids equal bilaterally without bruits. Abdomen:  soft, positive bowel sounds, no obvious tenderness Msk:  no apparent injury Pulses:  pulses normal in all 4 extremities Extremities:  trace left pedal edema and trace right pedal edema.   Neurologic:  Alert and oriented x 3. Skin:  Intact without lesions or rashes. Psych:  Normal affect.   EKG  Procedure date:  06/09/2010  Findings:       sinus bradycardia, T wave inversion inferiorly and laterally, no acute changes.   ILR Following MD Sherryl Manges, MD DOI:  10/19/2007 Vendor:  [employer]     Model Number:  [id number]     Serial Number [id number]        Impression & Recommendations:  Problem # 1:  CAD (ICD-414.00) Assessment Unchanged  His updated medication list for this problem includes:    Adult Aspirin Low Strength 81 Mg Tbdp (Aspirin) ..... Once daily    Warfarin Sodium 2 Mg Tabs (Warfarin sodium) ..... Use as directed by anticoagulation clinic    Nitrolingual 0.4 Mg/spray Soln (Nitroglycerin) ..... One spray under tongue every 5 minutes as needed for chest pain---may repeat times three  Problem # 2:  PAROXYSMAL ATRIAL FIBRILLATION (ICD-427.31) Assessment: Improved  His updated medication list for this problem includes:    Adult Aspirin Low Strength 81 Mg Tbdp (Aspirin) ..... Once daily    Warfarin Sodium 2 Mg Tabs (Warfarin sodium) ..... Use as directed by anticoagulation clinic  Problem # 3:  HYPERLIPIDEMIA (ICD-272.4) Assessment: Deteriorated Will continue his simvastatin per his request. He prefers generic. His updated medication list for this problem includes:    Simvastatin 40 Mg Tabs (Simvastatin) ..... One in the evening  Problem # 4:  HYPERTENSION (ICD-401.9) Assessment: Improved  His updated medication list for this problem includes:    Adult Aspirin Low Strength 81 Mg Tbdp (Aspirin) ..... Once daily    Spironolactone 25 Mg Tabs (Spironolactone) ..... Once daily    Micardis 40 Mg Tabs (Telmisartan) ..... Once daily  Problem # 5:  ALCOHOLIC CARDIOMYOPATHY (ICD-425.5) Assessment: Improved  His updated medication list for this problem includes:    Adult Aspirin Low Strength 81 Mg Tbdp (Aspirin) ..... Once daily    Spironolactone 25 Mg Tabs (Spironolactone) ..... Once daily    Micardis 40 Mg Tabs (Telmisartan) ..... Once daily    Warfarin Sodium 2 Mg Tabs (Warfarin sodium) ..... Use as directed by  anticoagulation clinic    Nitrolingual 0.4 Mg/spray Soln (Nitroglycerin) ..... One spray under tongue every 5 minutes as needed for chest pain---may repeat times three  Problem # 6:  COUMADIN THERAPY (ICD-V58.61) Assessment: Unchanged  Problem # 7:  CVA (ICD-434.91) Assessment:  Unchanged  His updated medication list for this problem includes:    Adult Aspirin Low Strength 81 Mg Tbdp (Aspirin) ..... Once daily    Warfarin Sodium 2 Mg Tabs (Warfarin sodium) ..... Use as directed by anticoagulation clinic  Problem # 8:  SYNCOPE (ICD-780.2) Assessment: New He is clearly orthostatic. He drops his blood pressure but increases his heart rate. He's had bradycardia in the past which was not exactly clear if this was symptomatic or not. He is not on AV nodal blocking drugs. I encouraged him to push fluids and have gone over orthostatic precautions. He's been advised to seek primary care about his straining with urination. His updated medication list for this problem includes:    Adult Aspirin Low Strength 81 Mg Tbdp (Aspirin) ..... Once daily    Warfarin Sodium 2 Mg Tabs (Warfarin sodium) ..... Use as directed by anticoagulation clinic    Nitrolingual 0.4 Mg/spray Soln (Nitroglycerin) ..... One spray under tongue every 5 minutes as needed for chest pain---may repeat times three  Problem # 9:  BRADYCARDIA (ICD-427.89) Assessment: Unchanged  His updated medication list for this problem includes:    Adult Aspirin Low Strength 81 Mg Tbdp (Aspirin) ..... Once daily    Warfarin Sodium 2 Mg Tabs (Warfarin sodium) ..... Use as directed by anticoagulation clinic    Nitrolingual 0.4 Mg/spray Soln (Nitroglycerin) ..... One spray under tongue every 5 minutes as needed for chest pain---may repeat times three  Clinical Reports Reviewed:  CXR:  04/16/2008: CXR Results:  Clinical Data: Follow up multiple fractures.    PORTABLE CHEST - 1 VIEW    Comparison: 1 day prior    Findings: Midline trachea.  Heart size upper limits of normal   accentuated by low lung volumes.  Tiny left-sided pleural effusion   cannot be excluded layering posteriorly. No pneumothorax.  Low lung   volumes with resultant pulmonary interstitial prominence.  Mild   left base atelectasis.    IMPRESSION:    1.  Development of left base atelectasis with possible layering   small left pleural effusion.    Read By:  Consuello Bossier,  M.D.   Released By:  Consuello Bossier,  M.D.  04/15/2008: CXR Results:     Clinical Data: Preop for facial fractures.    PORTABLE CHEST - 1 VIEW    Comparison: CT of  earlier today    Findings: Midline trachea. Normal heart size for level of   inspiration.  No pleural effusion or pneumothorax. Low lung volumes   with resultant pulmonary interstitial prominence.  No free   intraperitoneal air.  Sternal fracture is not apparent on frontal   view.    IMPRESSION:    1. No acute cardiopulmonary disease.   2.  The sternal fracture described on CT is not apparent on frontal   view.    Read By:  Consuello Bossier,  M.D.   Released By:  Consuello Bossier,  M.D.  Cardiac Cath:  03/14/2005: Cardiac Cath Findings:   CONCLUSION:  1.  Nonobstructive coronary artery disease with 40% narrowing of the      proximal left anterior descending artery, 40% narrowing in the distal      circumflex artery, 0% stenosis at the stent site in the mid-right      coronary artery, and 40% narrowing in the proximal to mid-right coronary      artery.  2.  Normal left ventricular function.   RECOMMENDATIONS:  The patient has only nonobstructive  coronary disease, and  his left ventricular function has normalized since 2004.  In retrospect, his  previous cardiomyopathy may have been a rate-related cardiomyopathy related  to his atrial fibrillation, although I do not have documentation of the past  rate.  I will plan to continue medical therapy and continue workup of his  fever.   BB/MEDQ  D:  03/14/2005  T:   03/15/2005  Job:  [id number]   cc:   Dr. Leanor Rubenstein C. Wall, M.D.  01/01/2003: Cardiac Cath Findings:   CONCLUSION:  1. Coronary artery disease with no significant obstruction in the left     anterior descending artery, 70% narrowing in the distal circumflex artery     and 90% narrowing in the mid right coronary artery.  2. Nonischemic cardiomyopathy with ejection fraction of 15-20% by prior     echocardiography.  3. Successful stenting of the lesion in the mid right coronary artery with a     drug-eluting stent with improvement in the percent diameter narrowing     from 90% to 0%.   DISPOSITION:  The patient was returned to the postangioplasty unit for  further observation.                                             Charlies Constable, M.D.   BB/MEDQ  D:  01/01/2003  T:  01/02/2003  Job:  [id number]   cc:   [name], M.D.   Patient Instructions: 1)  Your physician recommends that you schedule a follow-up appointment in: 6 months with Dr. Daleen Squibb 2)  Please remember to drink 6 glasses of water a day to keep yourself well hydrated.  3)  Follow up with your primary care doctor for urination problems. 4)  Remember when going from a lying position to get up slowly and let your body adjust.  Also when going from a sitting position to standing position take your time standing.  Do not just suddenly get up as this may increase your dizziness/lightheadedness.  Take your time.

## 2010-09-28 NOTE — Medication Information (Signed)
Summary: rov/tm  Anticoagulant Therapy  Managed by: Reina Fuse, PharmD Referring MD: Valera Castle MD PCP: Dr. Reinaldo Raddle MD: Eden Emms MD, Theron Arista Indication 1: atrial fibrillation?? (ICD-427.31) Indication 2: stroke-embolic (ICD-436.0) Lab Used: [facility] Site: [facility] INR POC 3.5 INR RANGE 2 - 3  Dietary changes: no    Health status changes: no    Bleeding/hemorrhagic complications: no    Recent/future hospitalizations: no    Any changes in medication regimen? no    Recent/future dental: no  Any missed doses?: no       Is patient compliant with meds? yes       Allergies: No Known Drug Allergies  Anticoagulation Management History:      The patient is taking warfarin and comes in today for a routine follow up visit.  Positive risk factors for bleeding include an age of 73 years or older and history of CVA/TIA.  The bleeding index is 'intermediate risk'.  Positive CHADS2 values include History of HTN and Prior Stroke/CVA/TIA.  Negative CHADS2 values include Age > 1 years old.  The start date was 10/22/2001.  His last INR was 6.6 ratio.  Anticoagulation responsible provider: Eden Emms MD, Theron Arista.  INR POC: 3.5.  Cuvette Lot#: [id number].  Exp: 06/2011.    Anticoagulation Management Assessment/Plan:      The patient's current anticoagulation dose is Warfarin sodium 2 mg tabs: Use as directed by Anticoagulation Clinic.  The target INR is 2.0-3.0.  The next INR is due 07/28/2010.  Anticoagulation instructions were given to patient/sister.  Results were reviewed/authorized by Reina Fuse, PharmD.  He was notified by Reina Fuse, PharmD.         Prior Anticoagulation Instructions: INR 3.1 Today take 1 tablet then resume 2 tablets everyday except 1 tablet on Saturdays. Recheck in 4 weeks.   Current Anticoagulation Instructions: INR 3.5  Take Coumadin 2 tabs (4 mg) on all days except Coumadin 1 tab (2 mg) on Wednesdays and Saturdays. Return to clinic in 2 weeks.

## 2010-09-28 NOTE — Letter (Signed)
Summary: New Patient letter  [facility]  [address] [place], [place] [address]   Phone: [phone]  Fax: [phone]       08/05/2010 MRN: [id number]    Vincent [name] [address] [place], [place]  [address]  Dear Mr. BLANSETT,  Welcome to the Gastroenterology Division at [facility].    You are scheduled to see Dr. Arlyce Dice on September 17, 2010 at 10:00 a.m. on the 3rd floor at [facility], [address]  We ask that you try to arrive at our office 15 minutes prior to your appointment time to allow for check-in.  We would like you to complete the enclosed self-administered evaluation form prior to your visit and bring it with you on the day of your appointment.  We will review it with you.  Also, please bring a complete list of all your medications or, if you prefer, bring the medication bottles and we will list them.  Please bring your insurance card so that we may make a copy of it.  If your insurance requires a referral to see a specialist, please bring your referral form from your primary care physician.  Co-payments are due at the time of your visit and may be paid by cash, check or credit card.     Your office visit will consist of a consult with your physician (includes a physical exam), any laboratory testing he/she may order, scheduling of any necessary diagnostic testing (e.g. x-ray, ultrasound, CT-scan), and scheduling of a procedure (e.g. Endoscopy, Colonoscopy) if required.  Please allow enough time on your schedule to allow for any/all of these possibilities.    If you cannot keep your appointment, please call [phone] to cancel or reschedule prior to your appointment date.  This allows [place] the opportunity to schedule an appointment for another patient in need of care.  If you do not cancel or reschedule by 5 p.m. the business day prior to your appointment date, you will be charged a $50.00 late cancellation/no-show fee.    Thank you  for choosing [facility] for your medical needs.  We appreciate the opportunity to care for you.  Please visit [place] at our website  to learn more about our practice.                     Sincerely,                                                             The Gastroenterology Division

## 2010-09-28 NOTE — Letter (Signed)
Summary: Custom - Lipid  [facility], Main Office  [address]   [place], [place] [address]   Phone: [phone]  Fax: [phone]     December 08, 2009 MRN: [id number]   Vincent [name] [address] [place], [place]  [address]   Dear [name],  We have reviewed your cholesterol results.  They are as follows:     Total Cholesterol:    173 (Desirable: less than 200)       HDL  Cholesterol:     51.70  (Desirable: greater than 40 for men and 50 for women)       LDL Cholesterol:       90  (Desirable: less than 100 for low risk and less than 70 for moderate to high risk)       Triglycerides:       158.0  (Desirable: less than 150)  Our recommendations include:   Call our office at the number listed above if you have any questions.  Lowering your LDL cholesterol is important, but it is only one of a large number of "risk factors" that may indicate that you are at risk for heart disease, stroke or other complications of hardening of the arteries.  Other risk factors include:   A.  Cigarette Smoking* B.  High Blood Pressure* C.  Obesity* D.   Low HDL Cholesterol (see yours above)* E.   Diabetes Mellitus (higher risk if your is uncontrolled) F.  Family history of premature heart disease G.  Previous history of stroke or cardiovascular disease    *These are risk factors YOU HAVE CONTROL OVER.  For more information, visit .  There is now evidence that lowering the TOTAL CHOLESTEROL AND LDL CHOLESTEROL can reduce the risk of heart disease.  The American Heart Association recommends the following guidelines for the treatment of elevated cholesterol:  1.  If there is now current heart disease and less than two risk factors, TOTAL CHOLESTEROL should be less than 200 and LDL CHOLESTEROL should be less than 100. 2.  If there is current heart disease or two or more risk factors, TOTAL CHOLESTEROL should be less than 200 and LDL CHOLESTEROL should be less than 70.  A  diet low in cholesterol, saturated fat, and calories is the cornerstone of treatment for elevated cholesterol.  Cessation of smoking and exercise are also important in the management of elevated cholesterol and preventing vascular disease.  Studies have shown that 30 to 60 minutes of physical activity most days can help lower blood pressure, lower cholesterol, and keep your weight at a healthy level.  Drug therapy is used when cholesterol levels do not respond to therapeutic lifestyle changes (smoking cessation, diet, and exercise) and remains unacceptably high.  If medication is started, it is important to have you levels checked periodically to evaluate the need for further treatment options.  Thank you,    [facility] Team

## 2010-09-28 NOTE — Medication Information (Signed)
Summary: rov/nb  Anticoagulant Therapy  Managed by: Weston Brass, PharmD Referring MD: Valera Castle MD PCP: Dr. Reinaldo Raddle MD: Eden Emms MD, Theron Arista Indication 1: atrial fibrillation?? 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 no    Recent/future dental: no  Any missed doses?: no       Is patient compliant with meds? yes       Allergies: No Known Drug Allergies  Anticoagulation Management History:      The patient is taking warfarin and comes in today for a routine follow up visit.  Positive risk factors for bleeding include an age of 73 years or older and history of CVA/TIA.  The bleeding index is 'intermediate risk'.  Positive CHADS2 values include History of HTN and Prior Stroke/CVA/TIA.  Negative CHADS2 values include Age > 73 years old.  The start date was 10/22/2001.  His last INR was 6.6 ratio.  Anticoagulation responsible Gladiola Madore: Eden Emms MD, Theron Arista.  INR POC: 2.5.  Cuvette Lot#: [id number].  Exp: 07/2011.    Anticoagulation Management Assessment/Plan:      The patient's current anticoagulation dose is Warfarin sodium 2 mg tabs: Use as directed by Anticoagulation Clinic.  The target INR is 2.0-3.0.  The next INR is due 08/17/2010.  Anticoagulation instructions were given to patient/sister.  Results were reviewed/authorized by Weston Brass, PharmD.  He was notified by Weston Brass PharmD.         Prior Anticoagulation Instructions: INR 1.3 Take 3 tablets today and tomorrow then resume previous dose of 2 tablets everyday except 1 tablet on Wednesday and Saturday Recheck INR in 1 week   Current Anticoagulation Instructions: INR 2.5  Continue same dose of 2 tablets every day except 1 tablet on Wednesday and Saturday.  Recheck  INR in 2 weeks.   Prescriptions: WARFARIN SODIUM 2 MG TABS (WARFARIN SODIUM) Use as directed by Anticoagulation Clinic  #180 x 0   Entered by:   Weston Brass PharmD   Authorized by:   Gaylord Shih, MD, [facility]   Signed by:   Weston Brass PharmD on 08/02/2010   Method used:   Electronically to        [facility] [id number] (retail)       [address] [place], [place]  [address]       Ph: [phone]       Fax: [phone]   RxID:   [id number]

## 2010-09-28 NOTE — Assessment & Plan Note (Signed)
Summary: per check out/sf   Visit Type:  Follow-up Primary Provider:  Dr. [name]  CC:  no complaints.  History of Present Illness: Mr Budai returns today for evaluation management of his multiple cardiac and vascular issues.  He has no angina, no ischemic symptoms, no orthopnea PND or peripheral edema. He's had no palpitations or syncope. He denies any bleeding.  He seems very compliant with his medications. His insurance is refusing to pay for Vytorin. We spent much of the visit today talking about switching him to simvastatin. I would only feel comfortable prescribing simvastatin 40 mg with his history of alcoholism.  Current Medications (verified): 1)  Cvs Loratadine 10 Mg  Tabs (Loratadine) .... Once Daily 2)  Pepcid 20 Mg  Tabs (Famotidine) .... Two Times A Day 3)  Adult Aspirin Low Strength 81 Mg  Tbdp (Aspirin) .... Once Daily 4)  Vytorin 10-40 Mg  Tabs (Ezetimibe-Simvastatin) .... Once Daily 5)  Spironolactone 25 Mg  Tabs (Spironolactone) .... Once Daily 6)  Micardis 40 Mg  Tabs (Telmisartan) .... Once Daily 7)  Warfarin Sodium 2 Mg Tabs (Warfarin Sodium) .... Use As Directed By Anticoagulation Clinic 8)  Meclizine Hcl 25 Mg  Tabs (Meclizine Hcl) .... As Needed 9)  Nitrolingual 0.4 Mg/spray Soln (Nitroglycerin) .... One Spray Under Tongue Every 5 Minutes As Needed For Chest Pain---May Repeat Times Three 10)  Lumigan 0.03 % Soln (Bimatoprost) .Marland Kitchen.. 1 Drop Ou Every Evening 11)  Alphagan P 0.1 % Soln (Brimonidine Tartrate) .... Use 1 Drop in Right Eye Twice Daily.  Allergies (verified): No Known Drug Allergies  Review of Systems       negative other than history of present illness  Vital Signs:  Patient profile:   75 year old male Height:      69 inches Weight:      201 pounds BMI:     29.79 Pulse rate:   65 / minute BP sitting:   136 / 70  (left arm) Cuff size:   regular  Vitals Entered By: Hardin Negus, RMA (November 27, 2009 9:48 AM)  Physical Exam  General:   Well developed, well nourished, in no acute distress. Head:  normocephalic and atraumatic Eyes:  PERRLA/EOM intact; conjunctiva and lids normal. Neck:  Neck supple, no JVD. No masses, thyromegaly or abnormal cervical nodes. Chest Soren Lazarz:  no deformities or breast masses noted Lungs:  Clear bilaterally to auscultation and percussion. Heart:  PMI displaced inferolaterally. Regular rate and rhythm normal S1-S2 no Msk:  Back normal, normal gait. Muscle strength and tone normal. Pulses:  pulses normal in all 4 extremities Extremities:  No clubbing or cyanosis. Neurologic:  Alert and oriented x 3. Skin:  Intact without lesions or rashes. Psych:  Normal affect.    ILR Following MD Sherryl Manges, MD Vendor:  [facility] Number:  [phone]     Serial Number [id number]        Impression & Recommendations:  Problem # 1:  CAD (ICD-414.00) Assessment Unchanged  His updated medication list for this problem includes:    Adult Aspirin Low Strength 81 Mg Tbdp (Aspirin) ..... Once daily    Warfarin Sodium 2 Mg Tabs (Warfarin sodium) ..... Use as directed by anticoagulation clinic    Nitrolingual 0.4 Mg/spray Soln (Nitroglycerin) ..... One spray under tongue every 5 minutes as needed for chest pain---may repeat times three  Problem # 2:  PAROXYSMAL ATRIAL FIBRILLATION (ICD-427.31) Assessment: Unchanged  His updated medication list for this  problem includes:    Adult Aspirin Low Strength 81 Mg Tbdp (Aspirin) ..... Once daily    Warfarin Sodium 2 Mg Tabs (Warfarin sodium) ..... Use as directed by anticoagulation clinic  Problem # 3:  HYPERLIPIDEMIA (ICD-272.4) See . There was side they were switched to just simvastatin. He'll need a lipid panel in 6 weeks regardless. His updated medication list for this problem includes:    Simvastatin 40 Mg Tabs (Simvastatin) .Marland Kitchen... 1 once daily  Problem # 4:  VENTRICULAR TACHYCARDIA (ICD-427.1) Assessment: Unchanged  His updated medication list for this  problem includes:    Adult Aspirin Low Strength 81 Mg Tbdp (Aspirin) ..... Once daily    Warfarin Sodium 2 Mg Tabs (Warfarin sodium) ..... Use as directed by anticoagulation clinic    Nitrolingual 0.4 Mg/spray Soln (Nitroglycerin) ..... One spray under tongue every 5 minutes as needed for chest pain---may repeat times three  Problem # 5:  HYPERTENSION (ICD-401.9) Assessment: Improved  His updated medication list for this problem includes:    Adult Aspirin Low Strength 81 Mg Tbdp (Aspirin) ..... Once daily    Spironolactone 25 Mg Tabs (Spironolactone) ..... Once daily    Micardis 40 Mg Tabs (Telmisartan) ..... Once daily  Problem # 6:  COUMADIN THERAPY (ICD-V58.61) Assessment: Unchanged  Problem # 7:  CVA (ICD-434.91) Assessment: Unchanged  His updated medication list for this problem includes:    Adult Aspirin Low Strength 81 Mg Tbdp (Aspirin) ..... Once daily    Warfarin Sodium 2 Mg Tabs (Warfarin sodium) ..... Use as directed by anticoagulation clinic  Problem # 8:  ALCOHOLIC CARDIOMYOPATHY (ICD-425.5) Assessment: Improved  His updated medication list for this problem includes:    Adult Aspirin Low Strength 81 Mg Tbdp (Aspirin) ..... Once daily    Spironolactone 25 Mg Tabs (Spironolactone) ..... Once daily    Micardis 40 Mg Tabs (Telmisartan) ..... Once daily    Warfarin Sodium 2 Mg Tabs (Warfarin sodium) ..... Use as directed by anticoagulation clinic    Nitrolingual 0.4 Mg/spray Soln (Nitroglycerin) ..... One spray under tongue every 5 minutes as needed for chest pain---may repeat times three  Patient Instructions: 1)  Your physician recommends that you schedule a follow-up appointment in: 6 MONTHS WITH DR Eames Dibiasio 2)  Your physician has recommended you make the following change in your medication: stop vytorin and start simvastatin 40 mg every day 3)  Your physician recommends that you return for lab work in:6 weeks fasting Prescriptions: SIMVASTATIN 40 MG TABS (SIMVASTATIN) 1  once daily  #30 x 11   Entered by:   Scherrie Bateman, LPN   Authorized by:   Gaylord Shih, MD, [facility]   Signed by:   Scherrie Bateman, LPN on 57/84/6962   Method used:   Electronically to        [facility] [id number] (retail)       [address] [place], [place]  [address]       Ph: [phone]       Fax: [phone]   RxID:   [id number]

## 2010-09-28 NOTE — Medication Information (Signed)
Summary: ccr/ gd  Anticoagulant Therapy  Managed by: Eda Keys, PharmD Referring MD: Valera Castle MD PCP: Dr. Reinaldo Raddle MD: Eden Emms MD, Theron Arista Indication 1: atrial fibrillation?? 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 no    Recent/future dental: no  Any missed doses?: no       Is patient compliant with meds? yes       Current Medications (verified): 1)  Cvs Loratadine 10 Mg  Tabs (Loratadine) .... Once Daily 2)  Pepcid 20 Mg  Tabs (Famotidine) .... Two Times A Day 3)  Adult Aspirin Low Strength 81 Mg  Tbdp (Aspirin) .... Once Daily 4)  Vytorin 10-40 Mg  Tabs (Ezetimibe-Simvastatin) .... Once Daily 5)  Spironolactone 25 Mg  Tabs (Spironolactone) .... Once Daily 6)  Micardis 40 Mg  Tabs (Telmisartan) .... Once Daily 7)  Warfarin Sodium 2 Mg Tabs (Warfarin Sodium) .... Use As Directed By Anticoagulation Clinic 8)  Meclizine Hcl 25 Mg  Tabs (Meclizine Hcl) .... As Needed 9)  Nitrolingual 0.4 Mg/spray Soln (Nitroglycerin) .... One Spray Under Tongue Every 5 Minutes As Needed For Chest Pain---May Repeat Times Three 10)  Lumigan 0.03 % Soln (Bimatoprost) .Marland Kitchen.. 1 Drop Ou Every Evening 11)  Alphagan P 0.1 % Soln (Brimonidine Tartrate) .... Use 1 Drop in Right Eye Twice Daily.  Allergies (verified): No Known Drug Allergies  Anticoagulation Management History:      The patient is taking warfarin and comes in today for a routine follow up visit.  Positive risk factors for bleeding include an age of 73 years or older and history of CVA/TIA.  The bleeding index is 'intermediate risk'.  Positive CHADS2 values include History of HTN and Prior Stroke/CVA/TIA.  Negative CHADS2 values include Age > 99 years old.  The start date  was 10/22/2001.  His last INR was 6.6 ratio.  Anticoagulation responsible provider: Eden Emms MD, Theron Arista.  INR POC: 2.3.  Cuvette Lot#: [id number].  Exp: 12/2010.    Anticoagulation Management Assessment/Plan:      The patient's current anticoagulation dose is Warfarin sodium 2 mg tabs: Use as directed by Anticoagulation Clinic.  The target INR is 2.0-3.0.  The next INR is due 12/02/2009.  Anticoagulation instructions were given to patient/sister.  Results were reviewed/authorized by Eda Keys, PharmD.  He was notified by Eda Keys.         Prior Anticoagulation Instructions: INR 2.3 Continue 4mg s everyday except 2mg s on Wednesdays. Recheck in 4 weeks.   Current Anticoagulation Instructions: INR 2.3  Continue taking 1 tablet on Wednesday and 2 tablets all other days.  Return to clinic in 4 weeks.

## 2010-09-28 NOTE — Medication Information (Signed)
Summary: rov/eac  Anticoagulant Therapy  Managed by: Weston Brass, PharmD Referring MD: Valera Castle MD PCP: Dr. Reinaldo Raddle MD: Gala Romney MD, Reuel Boom Indication 1: atrial fibrillation?? 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 no    Recent/future dental: no  Any missed doses?: yes     Details: missed 1 dose over weekend but doubled up the next day to make up for it  Is patient compliant with meds? yes       Allergies: No Known Drug Allergies  Anticoagulation Management History:      The patient is taking warfarin and comes in today for a routine follow up visit.  Positive risk factors for bleeding include an age of 75 years or older and history of CVA/TIA.  The bleeding index is 'intermediate risk'.  Positive CHADS2 values include History of HTN and Prior Stroke/CVA/TIA.  Negative CHADS2 values include Age > 73 years old.  The start date was 10/22/2001.  His last INR was 6.6 ratio.  Anticoagulation responsible provider: Jayven Naill MD, Reuel Boom.  INR POC: 2.0.  Cuvette Lot#: [id number].  Exp: 03/2011.    Anticoagulation Management Assessment/Plan:      The patient's current anticoagulation dose is Warfarin sodium 2 mg tabs: Use as directed by Anticoagulation Clinic.  The target INR is 2.0-3.0.  The next INR is due 03/02/2010.  Anticoagulation instructions were given to patient/sister.  Results were reviewed/authorized by Weston Brass, PharmD.  He was notified by Weston Brass PharmD.         Prior Anticoagulation Instructions: INR 1.8  Take 3 tablets today.  Then return to normal dosing schedule of 1 tablet on Saturday and 2 tablets all other days.  Return to clinic in 4 weeks.    Current Anticoagulation Instructions: INR  2.0  Continue same dose of 2 tablets every day except 1 tablet on Saturday.

## 2010-09-28 NOTE — Medication Information (Signed)
Summary: rov/sl  Anticoagulant Therapy  Managed by: Lyna Poser, PharmD Referring MD: Valera Castle MD PCP: Dr. Reinaldo Raddle MD: Allred MD,James Indication 1: atrial fibrillation?? 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 no    Recent/future dental: no  Any missed doses?: no       Is patient compliant with meds? yes       Allergies: No Known Drug Allergies  Anticoagulation Management History:      The patient is taking warfarin and comes in today for a routine follow up visit.  Positive risk factors for bleeding include an age of 75 years or older and history of CVA/TIA.  The bleeding index is 'intermediate risk'.  Positive CHADS2 values include History of HTN and Prior Stroke/CVA/TIA.  Negative CHADS2 values include Age > 32 years old.  The start date was 10/22/2001.  His last INR was 6.6 ratio.  Anticoagulation responsible Sion Thane: Allred MD,James.  INR POC: 2.3.  Cuvette Lot#: [id number].  Exp: 05/2011.    Anticoagulation Management Assessment/Plan:      The patient's current anticoagulation dose is Warfarin sodium 2 mg tabs: Use as directed by Anticoagulation Clinic.  The target INR is 2.0-3.0.  The next INR is due 06/09/2010.  Anticoagulation instructions were given to patient/sister.  Results were reviewed/authorized by Lyna Poser, PharmD.         Prior Anticoagulation Instructions: INR 3.5  Today, Wednesday, August 31st, do not take Coumadin Then, continue taking Coumadin 2 tabs (4 mg) all days except Coumadin 1 tab (2 mg) on Saturdays.  Return to clinic in 2 weeks.     Current Anticoagulation Instructions: INR 2.3 Continue taking 2 tablets everyday except take 1 tablet on saturday. See you in 4 weeks.

## 2010-09-28 NOTE — Medication Information (Signed)
Summary: rov/mw  Anticoagulant Therapy  Managed by: Bethena Midget, RN, BSN Referring MD: Valera Castle MD PCP: Dr. Reinaldo Raddle MD: Eden Emms MD, Theron Arista Indication 1: atrial fibrillation?? 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 no    Recent/future dental: no  Any missed doses?: no       Is patient compliant with meds? yes       Allergies: No Known Drug Allergies  Anticoagulation Management History:      The patient is taking warfarin and comes in today for a routine follow up visit.  Positive risk factors for bleeding include an age of 75 years or older and history of CVA/TIA.  The bleeding index is 'intermediate risk'.  Positive CHADS2 values include History of HTN and Prior Stroke/CVA/TIA.  Negative CHADS2 values include Age > 73 years old.  The start date was 10/22/2001.  His last INR was 6.6 ratio.  Anticoagulation responsible provider: Eden Emms MD, Theron Arista.  INR POC: 3.1.  Cuvette Lot#: [id number].  Exp: 06/2011.    Anticoagulation Management Assessment/Plan:      The patient's current anticoagulation dose is Warfarin sodium 2 mg tabs: Use as directed by Anticoagulation Clinic.  The target INR is 2.0-3.0.  The next INR is due 07/07/2010.  Anticoagulation instructions were given to patient/sister.  Results were reviewed/authorized by Bethena Midget, RN, BSN.  He was notified by Bethena Midget, RN, BSN.         Prior Anticoagulation Instructions: INR 2.3 Continue taking 2 tablets everyday except take 1 tablet on saturday. See you in 4 weeks.  Current Anticoagulation Instructions: INR 3.1 Today take 1 tablet then resume 2 tablets everyday except 1 tablet on Saturdays. Recheck in 4 weeks.

## 2010-09-28 NOTE — Medication Information (Signed)
Summary: rov/tm  Anticoagulant Therapy  Managed by: Bethena Midget, RN, BSN Referring MD: Valera Castle MD PCP: Dr. Reinaldo Raddle MD: Riley Kill MD, Maisie Fus Indication 1: atrial fibrillation?? 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 no    Recent/future dental: no  Any missed doses?: no       Is patient compliant with meds? yes       Allergies: No Known Drug Allergies  Anticoagulation Management History:      The patient is taking warfarin and comes in today for a routine follow up visit.  Positive risk factors for bleeding include an age of 75 years or older and history of CVA/TIA.  The bleeding index is 'intermediate risk'.  Positive CHADS2 values include History of HTN and Prior Stroke/CVA/TIA.  Negative CHADS2 values include Age > 40 years old.  The start date was 10/22/2001.  His last INR was 6.6 ratio.  Anticoagulation responsible provider: Riley Kill MD, Maisie Fus.  INR POC: 2.3.  Cuvette Lot#: [id number].  Exp: 11/2010.    Anticoagulation Management Assessment/Plan:      The patient's current anticoagulation dose is Warfarin sodium 2 mg tabs: Use as directed by Anticoagulation Clinic.  The target INR is 2.0-3.0.  The next INR is due 11/03/2009.  Anticoagulation instructions were given to patient/sister.  Results were reviewed/authorized by Bethena Midget, RN, BSN.  He was notified by Bethena Midget, RN, BSN.         Prior Anticoagulation Instructions: INR 2.9 Continue 4mg s everyday except 2mg s on Wednesdays. Recheck in 3 weeks.   Current Anticoagulation Instructions: INR 2.3 Continue 4mg s everyday except 2mg s on Wednesdays. Recheck in 4 weeks.

## 2010-09-28 NOTE — Procedures (Signed)
Summary: device/saf   Current Medications (verified): 1)  Cvs Loratadine 10 Mg  Tabs (Loratadine) .... Once Daily 2)  Pepcid 20 Mg  Tabs (Famotidine) .... Two Times A Day 3)  Adult Aspirin Low Strength 81 Mg  Tbdp (Aspirin) .... Once Daily 4)  Spironolactone 25 Mg  Tabs (Spironolactone) .... Once Daily 5)  Micardis 40 Mg  Tabs (Telmisartan) .... Once Daily 6)  Warfarin Sodium 2 Mg Tabs (Warfarin Sodium) .... Use As Directed By Anticoagulation Clinic 7)  Meclizine Hcl 25 Mg  Tabs (Meclizine Hcl) .... As Needed 8)  Nitrolingual 0.4 Mg/spray Soln (Nitroglycerin) .... One Spray Under Tongue Every 5 Minutes As Needed For Chest Pain---May Repeat Times Three 9)  Lumigan 0.03 % Soln (Bimatoprost) .Marland Kitchen.. 1 Drop Ou Every Evening 10)  Alphagan P 0.1 % Soln (Brimonidine Tartrate) .... Use 1 Drop in Right Eye Twice Daily. 11)  Vytorin 10-80 Mg Tabs (Ezetimibe-Simvastatin) .... One By Mouth Daily  Allergies (verified): No Known Drug Allergies   ILR Following MD Sherryl Manges, MD Vendor:  [employer]     Model Number:  [id number]     Serial Number [id number]       Tachy Episodes:  17     Brady Episodes:  1 ILR Next Due 03/29/2010  Tech Comments:  All tachy episodes were noise.  1 brady episode rate 38bpm@ 5:29am, the patient was asymptomatic.  ROV 3 months clinic. Altha Harm, LPN  Dec 28, 1189 9:40 AM    Patient Instructions: 1)  Follow up with the device clinic in 3 months. 2)  Altha Harm, LPN  Dec 28, 4780 9:41 AM

## 2010-09-28 NOTE — Progress Notes (Signed)
  Phone Note Outgoing Call   Call placed by: Scherrie Bateman, LPN,  December 17, 2009 11:15 AM Call placed to: Patient Summary of Call: SPOKE WITH PT HAS NOT STARTED SIMVASTATIN  AS OF YET. CONT TO TAKE  VYTORIN ONCE DONE WILL START SIMVASTATIN .INSTRUCTED TO CALL OFICE ONCE STARTED SO WE COULD SCHEDULE LAB WORK IN 6 WEEKS VERBALIZED UNDERSTANDING. Initial call taken by: Scherrie Bateman, LPN,  December 17, 2009 11:16 AM

## 2010-09-28 NOTE — Progress Notes (Signed)
Summary: med  Phone Note Call from Patient Call back at [phone]   Caller: SISTER Reason for Call: Talk to Nurse Summary of Call: has decided to continue taking vytorin Initial call taken by: Migdalia Dk,  November 27, 2009 2:34 PM  Follow-up for Phone Call        PT AFTER MUCH THOUGHT DECIDED TO CONT TO TAKE VYTORIN 10/40 MG once daily.LABS SCHEDULED FOR 12/02/09 AT 8:30 AM Follow-up by: Scherrie Bateman, LPN,  November 27, 2009 2:44 PM

## 2010-09-28 NOTE — Medication Information (Signed)
Summary: rov/cb  Anticoagulant Therapy  Managed by: Eda Keys, PharmD Referring MD: Valera Castle MD PCP: Dr. Reinaldo Raddle MD: Shirlee Latch MD, Freida Busman Indication 1: atrial fibrillation?? 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 no    Recent/future dental: no  Any missed doses?: no       Is patient compliant with meds? yes       Allergies: No Known Drug Allergies  Anticoagulation Management History:      The patient is taking warfarin and comes in today for a routine follow up visit.  Positive risk factors for bleeding include an age of 75 years or older and history of CVA/TIA.  The bleeding index is 'intermediate risk'.  Positive CHADS2 values include History of HTN and Prior Stroke/CVA/TIA.  Negative CHADS2 values include Age > 66 years old.  The start date was 10/22/2001.  His last INR was 6.6 ratio.  Anticoagulation responsible provider: Shirlee Latch MD, Emmett Arntz.  INR POC: 1.8.  Cuvette Lot#: [id number].  Exp: 03/2011.    Anticoagulation Management Assessment/Plan:      The patient's current anticoagulation dose is Warfarin sodium 2 mg tabs: Use as directed by Anticoagulation Clinic.  The target INR is 2.0-3.0.  The next INR is due 02/09/2010.  Anticoagulation instructions were given to patient/sister.  Results were reviewed/authorized by Eda Keys, PharmD.  He was notified by Eda Keys.         Prior Anticoagulation Instructions: INR 2.7. Take 2 tablets daily except 1 tablet on Saturdays.  Current Anticoagulation Instructions: INR 1.8  Take 3 tablets today.  Then return to normal dosing schedule of 1 tablet on Saturday and 2 tablets all other days.  Return to clinic in 4 weeks.

## 2010-09-28 NOTE — Progress Notes (Signed)
Summary: re device  Phone Note Call from Patient   Caller: Patient Reason for Call: Talk to Nurse Summary of Call: pt wants to talk to someone in the device clinic-battery needed replaced and got a new device-pls call [phone] Initial call taken by: Glynda Jaeger,  April 29, 2010 3:43 PM  Follow-up for Phone Call        Left message for patient to return call. Follow-up by: Altha Harm, LPN,  April 29, 2010 4:53 PM  Additional Follow-up for Phone Call Additional follow up Details #1::        Left message for patient to return call. Additional Follow-up by: Altha Harm, LPN,  April 30, 2010 4:14 PM    Additional Follow-up for Phone Call Additional follow up Details #2::    Left message for patient to rerutn call. Follow-up by: Altha Harm, LPN,  May 04, 2010 5:06 PM  Additional Follow-up for Phone Call Additional follow up Details #3:: Details for Additional Follow-up Action Taken: we need to send a letter to clarify the phone number Additional Follow-up by: Nathen May, MD, [facility],  May 04, 2010 6:45 PM

## 2010-09-28 NOTE — Medication Information (Signed)
Summary: rov/eac  Anticoagulant Therapy  Managed by: Elaina Pattee, PharmD Referring MD: Valera Castle MD PCP: Dr. Reinaldo Raddle MD: Gala Romney MD, Reuel Boom Indication 1: atrial fibrillation?? (ICD-427.31) Indication 2: stroke-embolic (ICD-436.0) Lab Used: [facility] Site: [facility] INR POC 3.6 INR RANGE 2 - 3  Dietary changes: yes       Details: May have eaten fewer greens.  Health status changes: no    Bleeding/hemorrhagic complications: no    Recent/future hospitalizations: no    Any changes in medication regimen? no    Recent/future dental: no  Any missed doses?: no       Is patient compliant with meds? yes       Allergies: No Known Drug Allergies  Anticoagulation Management History:      The patient is taking warfarin and comes in today for a routine follow up visit.  Positive risk factors for bleeding include an age of 73 years or older and history of CVA/TIA.  The bleeding index is 'intermediate risk'.  Positive CHADS2 values include History of HTN and Prior Stroke/CVA/TIA.  Negative CHADS2 values include Age > 18 years old.  The start date was 10/22/2001.  His last INR was 6.6 ratio.  Anticoagulation responsible provider: Bensimhon MD, Reuel Boom.  INR POC: 3.6.  Cuvette Lot#: [id number].  Exp: 12/2010.    Anticoagulation Management Assessment/Plan:      The patient's current anticoagulation dose is Warfarin sodium 2 mg tabs: Use as directed by Anticoagulation Clinic.  The target INR is 2.0-3.0.  The next INR is due 12/15/2009.  Anticoagulation instructions were given to patient/sister.  Results were reviewed/authorized by Elaina Pattee, PharmD.  He was notified by Elaina Pattee, PharmD.         Prior Anticoagulation Instructions: INR 2.3  Continue taking 1 tablet on Wednesday and 2 tablets all other days.  Return to clinic in 4 weeks.   Current Anticoagulation Instructions: INR 3.6. Hold Coumadin today, then continue to take 2 tablets daily except 1 tablet on  Saturdays.  Recheck in 2 weeks.

## 2010-09-28 NOTE — Assessment & Plan Note (Signed)
Summary: pc2 check st jude/sl   Primary Provider:  Dr. [name]   History of Present Illness:  [name] is seen in followup for a loop recorder implanted for recurrent presyncope.  he has had previously asymptomatic ventricular tachycardia identified on his monitor.  He has had no recurrent symptoms.  he denies chest pain shortness of breath or peripheral edema.  echo 2009 demonstrated normal left ventricular function MRI last year demonstrated no scar or hyperenhancement.  Current Medications (verified): 1)  Cvs Loratadine 10 Mg  Tabs (Loratadine) .... Once Daily 2)  Pepcid 20 Mg  Tabs (Famotidine) .... Two Times A Day 3)  Adult Aspirin Low Strength 81 Mg  Tbdp (Aspirin) .... Once Daily 4)  Vytorin 10-40 Mg  Tabs (Ezetimibe-Simvastatin) .... Once Daily 5)  Spironolactone 25 Mg  Tabs (Spironolactone) .... Once Daily 6)  Micardis 40 Mg  Tabs (Telmisartan) .... Once Daily 7)  Warfarin Sodium 2 Mg Tabs (Warfarin Sodium) .... Use As Directed By Anticoagulation Clinic 8)  Meclizine Hcl 25 Mg  Tabs (Meclizine Hcl) .... As Needed 9)  Nitrolingual 0.4 Mg/spray Soln (Nitroglycerin) .... One Spray Under Tongue Every 5 Minutes As Needed For Chest Pain---May Repeat Times Three 10)  Lumigan 0.03 % Soln (Bimatoprost) .Marland Kitchen.. 1 Drop Ou Every Evening  Allergies (verified): No Known Drug Allergies  Past History:  Past Medical History: Last updated: 12/02/2008 CAD (ICD-414.00) PAROXYSMAL ATRIAL FIBRILLATION (ICD-427.31) HYPERLIPIDEMIA (ICD-272.4) HYPERTENSION (ICD-401.9) ALCOHOLIC CARDIOMYOPATHY (ICD-425.5) COUMADIN THERAPY (ICD-V58.61) CVA (ICD-434.91) PULMONARY INFILTRATE INCLUDES (EOSINOPHILIA) (ICD-518.3) COUGH (ICD-786.2)  Past Surgical History: Last updated: 12/02/2008 Drug-eluting stent May 2004 Loop recorder..10/19/07  Family History: Last updated: 12/02/2008 Family History of CVA or Stroke:  Fx of MI  Social History: Last updated: 12/02/2008 Single  Tobacco Use - Former.   quit 14 yrs ago Alcohol Use - yes .Marland KitchenMarland KitchenHx of alcohol abuse, quit in 2003  Vital Signs:  Patient profile:   75 year old male Height:      69 inches Weight:      200 pounds BMI:     29.64 Pulse rate:   68 / minute Pulse rhythm:   regular BP sitting:   149 / 75  (left arm) Cuff size:   large  Vitals Entered By: Judithe Modest CMA (October 06, 2009 9:28 AM)  Physical Exam  General:  /The patient was alert and oriented in no acute distress. HEENT Normal.  Neck veins were flat, carotids were brisk. His glasses were askew Lungs were clear.  Heart sounds were regular without murmurs or gallops.  Abdomen was soft with active bowel sounds. There is no clubbing cyanosis or edema. Skin Warm and dry     ILR Following MD Sherryl Manges, MD Vendor:  [facility] Jude     Model Number:  [id number]     Serial Number [id number]       Tachy Episodes:  9     Brady Episodes:  13  Tech Comments:  Tachy episodes 75bpm.  Brady episodes lowest rate 35bpm all during sleeping hours.  The patient has been asymptomatic.  ROV 3 months clinic.  Checked by [employer]. Altha Harm, LPN  October 06, 2009 9:41 AM    Impression & Recommendations:  Problem # 1:  SYNCOPE (ICD-780.2) He's had no recurrent episodes. His updated medication list for this problem includes:    Adult Aspirin Low Strength 81 Mg Tbdp (Aspirin) ..... Once daily    Warfarin Sodium 2 Mg Tabs (Warfarin sodium) ..... Use as directed by anticoagulation  clinic    Nitrolingual 0.4 Mg/spray Soln (Nitroglycerin) ..... One spray under tongue every 5 minutes as needed for chest pain---may repeat times three  Problem # 2:  CAD (ICD-414.00) No chest pain; I will defer to Dr. wall the discontinuation of his aspirin as stated his ongoing use of Coumadin His updated medication list for this problem includes:    Adult Aspirin Low Strength 81 Mg Tbdp (Aspirin) ..... Once daily    Warfarin Sodium 2 Mg Tabs (Warfarin sodium) ..... Use as directed by anticoagulation  clinic    Nitrolingual 0.4 Mg/spray Soln (Nitroglycerin) ..... One spray under tongue every 5 minutes as needed for chest pain---may repeat times three  Problem # 3:  PAROXYSMAL ATRIAL FIBRILLATION (ICD-427.31) On Coumadin patient to stop his aspirin His updated medication list for this problem includes:    Adult Aspirin Low Strength 81 Mg Tbdp (Aspirin) ..... Once daily    Warfarin Sodium 2 Mg Tabs (Warfarin sodium) ..... Use as directed by anticoagulation clinic  Problem # 4:  LOOP RECORDER (ICD-V45.00) some nocturnal bradycardia was identified. This is likely nonclinical. He has had no symptoms and no further nonsustained ventricular tachycardia  Patient Instructions: 1)  Your physician recommends that you schedule a follow-up appointment in: 3 months with device clinic and 6 months with Dr. Graciela Husbands

## 2010-09-28 NOTE — Medication Information (Signed)
Summary: rov/tm  Anticoagulant Therapy  Managed by: Reina Fuse, PharmD Referring MD: Valera Castle MD PCP: Dr. Reinaldo Raddle MD: Eden Emms MD, Theron Arista Indication 1: atrial fibrillation?? 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 no    Recent/future dental: no  Any missed doses?: no       Is patient compliant with meds? yes       Allergies: No Known Drug Allergies  Anticoagulation Management History:      The patient is taking warfarin and comes in today for a routine follow up visit.  Positive risk factors for bleeding include an age of 30 years or older and history of CVA/TIA.  The bleeding index is 'intermediate risk'.  Positive CHADS2 values include History of HTN and Prior Stroke/CVA/TIA.  Negative CHADS2 values include Age > 2 years old.  The start date was 10/22/2001.  His last INR was 6.6 ratio.  Anticoagulation responsible provider: Eden Emms MD, Theron Arista.  INR POC: 3.5.  Cuvette Lot#: [id number].  Exp: 05/2011.    Anticoagulation Management Assessment/Plan:      The patient's current anticoagulation dose is Warfarin sodium 2 mg tabs: Use as directed by Anticoagulation Clinic.  The target INR is 2.0-3.0.  The next INR is due 05/12/2010.  Anticoagulation instructions were given to patient/sister.  Results were reviewed/authorized by Reina Fuse, PharmD.  He was notified by Reina Fuse, PharmD.         Prior Anticoagulation Instructions: INR 2.3 Continue 4mg s everyday except 2mg s on Saturday. Recheck in 4 weeks.   Current Anticoagulation Instructions: INR 3.5  Today, Wednesday, August 31st, do not take Coumadin Then, continue taking Coumadin 2 tabs (4 mg) all days except Coumadin 1 tab (2 mg) on Saturdays.  Return to clinic in 2  weeks.

## 2010-09-28 NOTE — Miscellaneous (Signed)
  Clinical Lists Changes  Observations: Added new observation of PI CARDIO: Your physician recommends that you schedule a follow-up appointment in: 3 MONTHS WITH  KRISTIN AND PAULA Your physician recommends that you continue on your current medications as directed. Please refer to the Current Medication list given to you today. (04/13/2010 12:20)      Patient Instructions: 1)  Your physician recommends that you schedule a follow-up appointment in: 3 MONTHS WITH  KRISTIN AND PAULA 2)  Your physician recommends that you continue on your current medications as directed. Please refer to the Current Medication list given to you today.

## 2010-09-28 NOTE — Cardiovascular Report (Signed)
Summary: Office Visit   Office Visit   Imported By: Roderic Ovens 04/14/2010 11:36:09  _____________________________________________________________________  External Attachment:    Type:   Image     Comment:   External Document

## 2010-09-28 NOTE — Medication Information (Signed)
Summary: rov/tm  Anticoagulant Therapy  Managed by: Bethena Midget, RN, BSN Referring MD: Valera Castle MD PCP: Dr. Reinaldo Raddle MD: Gala Romney MD, Reuel Boom Indication 1: atrial fibrillation?? 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 no    Recent/future dental: no  Any missed doses?: no       Is patient compliant with meds? yes       Allergies: No Known Drug Allergies  Anticoagulation Management History:      The patient is taking warfarin and comes in today for a routine follow up visit.  Positive risk factors for bleeding include an age of 73 years or older and history of CVA/TIA.  The bleeding index is 'intermediate risk'.  Positive CHADS2 values include History of HTN and Prior Stroke/CVA/TIA.  Negative CHADS2 values include Age > 80 years old.  The start date was 10/22/2001.  His last INR was 6.6 ratio.  Anticoagulation responsible Jessicamarie Amiri: Bensimhon MD, Reuel Boom.  INR POC: 2.3.  Cuvette Lot#: [id number].  Exp: 05/2011.    Anticoagulation Management Assessment/Plan:      The patient's current anticoagulation dose is Warfarin sodium 2 mg tabs: Use as directed by Anticoagulation Clinic.  The target INR is 2.0-3.0.  The next INR is due 04/28/2010.  Anticoagulation instructions were given to patient/sister.  Results were reviewed/authorized by Bethena Midget, RN, BSN.  He was notified by Bethena Midget, RN, BSN.         Prior Anticoagulation Instructions: INR 2.6 Continue 2 pills everyday except 1 pill on Saturdays. Recheck in 4 weeks.   Current Anticoagulation Instructions: INR 2.3 Continue 4mg s everyday except 2mg s on Saturday. Recheck in 4 weeks.

## 2010-09-28 NOTE — Cardiovascular Report (Signed)
Summary: Office Visit   Office Visit   Imported By: Roderic Ovens 10/16/2009 13:02:44  _____________________________________________________________________  External Attachment:    Type:   Image     Comment:   External Document

## 2010-09-28 NOTE — Procedures (Signed)
Summary: ilr/st. jude   Current Medications (verified): 1)  Cvs Loratadine 10 Mg  Tabs (Loratadine) .... Once Daily 2)  Pepcid 20 Mg  Tabs (Famotidine) .... Two Times A Day 3)  Adult Aspirin Low Strength 81 Mg  Tbdp (Aspirin) .... Once Daily 4)  Spironolactone 25 Mg  Tabs (Spironolactone) .... Once Daily 5)  Micardis 40 Mg  Tabs (Telmisartan) .... Once Daily 6)  Warfarin Sodium 2 Mg Tabs (Warfarin Sodium) .... Use As Directed By Anticoagulation Clinic 7)  Meclizine Hcl 25 Mg  Tabs (Meclizine Hcl) .... As Needed 8)  Nitrolingual 0.4 Mg/spray Soln (Nitroglycerin) .... One Spray Under Tongue Every 5 Minutes As Needed For Chest Pain---May Repeat Times Three 9)  Lumigan 0.03 % Soln (Bimatoprost) .Marland Kitchen.. 1 Drop Ou Every Evening 10)  Alphagan P 0.1 % Soln (Brimonidine Tartrate) .... Use 1 Drop in Right Eye Twice Daily. 11)  Simvastatin 40 Mg Tabs (Simvastatin) .... One in The Evening 12)  Sudafed 30 Mg Tabs (Pseudoephedrine Hcl) .... As Needed  Allergies (verified): No Known Drug Allergies   ILR Following MD Sherryl Manges, MD DOI:  10/19/2007 Vendor:  [employer] Jude     Model Number:  [id number]     Serial Number [id number]       Tachy Episodes:  4     Brady Episodes:  36 ILR Next Due 09/29/2010  Tech Comments:  4 tachy episodes--all noise.  36 brady episodes--longest was 59 seconds.  HR's in 40s.  Pt had no symptoms since last check. ROV IN 3 mths w/device clinic. Vella Kohler  June 30, 2010 4:45 PM

## 2010-09-30 ENCOUNTER — Encounter (INDEPENDENT_AMBULATORY_CARE_PROVIDER_SITE_OTHER): Payer: Medicare Other

## 2010-09-30 ENCOUNTER — Ambulatory Visit: Admit: 2010-09-30 | Payer: Self-pay

## 2010-09-30 ENCOUNTER — Encounter: Payer: Self-pay | Admitting: Internal Medicine

## 2010-09-30 DIAGNOSIS — R55 Syncope and collapse: Secondary | ICD-10-CM

## 2010-09-30 NOTE — Letter (Signed)
Summary: [facility] Instructions  [facility]  [address] [place], [place] [address]   Phone: [phone]  Fax: [phone]       Vincent [name]    01-Dec-1935    MRN: [id number]        Procedure Day /Date:TUESDAY 10/05/2010     Arrival Time:1:30PM      Procedure Time:2:30PM     Location of Procedure:                    X   [facility] (4th Floor)                        PREPARATION FOR COLONOSCOPY WITH MOVIPREP   Starting 5 days prior to your procedure 09/30/2010 do not eat nuts, seeds, popcorn, corn, beans, peas,  salads, or any raw vegetables.  Do not take any fiber supplements (e.g. Metamucil, Citrucel, and Benefiber).  THE DAY BEFORE YOUR PROCEDURE         DATE: 10/04/2010  DAY: MONDAY  1.  Drink clear liquids the entire day-NO SOLID FOOD  2.  Do not drink anything colored red or purple.  Avoid juices with pulp.  No orange juice.  3.  Drink at least 64 oz. (8 glasses) of fluid/clear liquids during the day to prevent dehydration and help the prep work efficiently.  CLEAR LIQUIDS INCLUDE: Water Jello Ice Popsicles Tea (sugar ok, no milk/cream) Powdered fruit flavored drinks Coffee (sugar ok, no milk/cream) Gatorade Juice: apple, white grape, white cranberry  Lemonade Clear bullion, consomm, broth Carbonated beverages (any kind) Strained chicken noodle soup Hard Candy                             4.  In the morning, mix first dose of MoviPrep solution:    Empty 1 Pouch A and 1 Pouch B into the disposable container    Add lukewarm drinking water to the top line of the container. Mix to dissolve    Refrigerate (mixed solution should be used within 24 hrs)  5.  Begin drinking the prep at 5:00 p.m. The MoviPrep container is divided by 4 marks.   Every 15 minutes drink the solution down to the next mark (approximately 8 oz) until the full liter is complete.   6.  Follow completed prep with 16 oz of clear liquid of your choice (Nothing red  or purple).  Continue to drink clear liquids until bedtime.  7.  Before going to bed, mix second dose of MoviPrep solution:    Empty 1 Pouch A and 1 Pouch B into the disposable container    Add lukewarm drinking water to the top line of the container. Mix to dissolve    Refrigerate  THE DAY OF YOUR PROCEDURE      DATE: 10/05/2010 DAY: TUES  Beginning at 9:30 a.m. (5 hours before procedure):         1. Every 15 minutes, drink the solution down to the next mark (approx 8 oz) until the full liter is complete.  2. Follow completed prep with 16 oz. of clear liquid of your choice.    3. You may drink clear liquids until 12:30 PM  (2 HOURS BEFORE PROCEDURE).   MEDICATION INSTRUCTIONS  Unless otherwise instructed, you should take regular prescription medications with a small sip of water   as early as possible the morning of your procedure.  Stop taking Coumadin on  _ _  (5 days before procedure). PENDING     You will be contaced by our office prior to your procedure for directions on holding your Coumadin/Warfarin.  If you do not hear from our office 1 week prior to your scheduled procedure, please call [phone] to discuss.    Additional medication instructions: _         OTHER INSTRUCTIONS  You will need a responsible adult at least 73 years of age to accompany you and drive you home.   This person must remain in the waiting room during your procedure.  Wear loose fitting clothing that is easily removed.  Leave jewelry and other valuables at home.  However, you may wish to bring a book to read or  an iPod/MP3 player to listen to music as you wait for your procedure to start.  Remove all body piercing jewelry and leave at home.  Total time from sign-in until discharge is approximately 2-3 hours.  You should go home directly after your procedure and rest.  You can resume normal activities the  day after your procedure.  The day of your procedure you should not:    Drive   Make legal decisions   Operate machinery   Drink alcohol   Return to work  You will receive specific instructions about eating, activities and medications before you leave.    The above instructions have been reviewed and explained to me by   _______________________    I fully understand and can verbalize these instructions _____________________________ Date _________

## 2010-09-30 NOTE — Assessment & Plan Note (Signed)
Summary: NEEDS COLON...LSW.   History of Present Illness Visit Type: Initial Consult Primary GI MD: Melvia Heaps MD [facility] Primary Provider: Amalia Hailey, MD  Requesting Provider: Amalia Hailey, MD  Chief Complaint: Consult colon. Pt denies any GI complaints  History of Present Illness:   [name] is a 75 year old Afro-American male referred at the request of Dr. [name] for evaluation of rectal bleeding.  The patient takes Coumadin because of paroxysmal atrial fibrillation.  On multiple occasions he has seen small amounts of blood mixed with the stools and discoloring the toilet water.  He denies rectal or abdominal pain.  Family history is pertinent for his mother who had colon cancer at about age 59.  The patient has coronary artery disease, alcoholic cardiomyopathy and history of CVA.   GI Review of Systems      Denies abdominal pain, acid reflux, belching, bloating, chest pain, dysphagia with liquids, dysphagia with solids, heartburn, loss of appetite, nausea, vomiting, vomiting blood, weight loss, and  weight gain.        Denies anal fissure, black tarry stools, change in bowel habit, constipation, diarrhea, diverticulosis, fecal incontinence, heme positive stool, hemorrhoids, irritable bowel syndrome, jaundice, light color stool, liver problems, rectal bleeding, and  rectal pain.    Current Medications (verified): 1)  Cvs Loratadine 10 Mg  Tabs (Loratadine) .... Once Daily 2)  Pepcid 20 Mg  Tabs (Famotidine) .... Two Times A Day 3)  Adult Aspirin Low Strength 81 Mg  Tbdp (Aspirin) .... Once Daily 4)  Spironolactone 25 Mg  Tabs (Spironolactone) .... Once Daily 5)  Micardis 40 Mg  Tabs (Telmisartan) .... Once Daily 6)  Warfarin Sodium 2 Mg Tabs (Warfarin Sodium) .... Use As Directed By Anticoagulation Clinic 7)  Meclizine Hcl 25 Mg  Tabs (Meclizine Hcl) .... As Needed 8)  Nitrolingual 0.4 Mg/spray Soln (Nitroglycerin) .... One Spray Under Tongue Every 5 Minutes As Needed For  Chest Pain---May Repeat Times Three 9)  Lumigan 0.03 % Soln (Bimatoprost) .Marland Kitchen.. 1 Drop Ou Every Evening 10)  Alphagan P 0.1 % Soln (Brimonidine Tartrate) .... Use 1 Drop in Right Eye Twice Daily. 11)  Simvastatin 40 Mg Tabs (Simvastatin) .... One in The Evening 12)  Sudafed 30 Mg Tabs (Pseudoephedrine Hcl) .... As Needed  Allergies (verified): No Known Drug Allergies  Past History:  Past Medical History: GLAUCOMA (ICD-365.9) GERD (ICD-530.81) BRADYCARDIA (ICD-427.89) ENCOUNTER FOR LONG-TERM USE OF OTHER MEDICATIONS (ICD-V58.69) LOOP RECORDER (ICD-V45.00) SYNCOPE (ICD-780.2) VENTRICULAR TACHYCARDIA (ICD-427.1) CAD (ICD-414.00) PAROXYSMAL ATRIAL FIBRILLATION (ICD-427.31) HYPERLIPIDEMIA (ICD-272.4) HYPERTENSION (ICD-401.9) ALCOHOLIC CARDIOMYOPATHY (ICD-425.5) COUMADIN THERAPY (ICD-V58.61) CVA (ICD-434.91) PULMONARY INFILTRATE INCLUDES (EOSINOPHILIA) (ICD-518.3) COUGH (ICD-786.2)  Past Surgical History: Drug-eluting stent May 2004 Loop recorder..10/19/07 Knee Surgery--Right   Family History: Family History of CVA or Stroke:  Fx of MI No FH of Colon Cancer: ? Mother   Social History: Retired Divorced No childern Tobacco Use - Former.  quit 14 yrs ago Alcohol Use - yes .Marland KitchenMarland KitchenHx of alcohol abuse, quit in 2003 Daily Caffeine Use: 2 daily  Illicit Drug Use - no Drug Use:  no  Review of Systems       The patient complains of allergy/sinus and arthritis/joint pain.  The patient denies anemia, anxiety-new, back pain, blood in urine, breast changes/lumps, change in vision, confusion, cough, coughing up blood, depression-new, fainting, fatigue, fever, headaches-new, hearing problems, heart murmur, heart rhythm changes, itching, menstrual pain, muscle pains/cramps, night sweats, nosebleeds, pregnancy symptoms, shortness of breath, skin rash, sleeping problems, sore throat, swelling of feet/legs,  swollen lymph glands, thirst - excessive , urination - excessive , urination changes/pain,  urine leakage, vision changes, and voice change.         All other systems were reviewed and were negative   Vital Signs:  Patient profile:   75 year old male Height:      69 inches Weight:      197 pounds BMI:     29.20 BSA:     2.05 Pulse rate:   58 / minute Pulse rhythm:   regular BP sitting:   132 / 60  (left arm) Cuff size:   regular  Vitals Entered By: Ok Anis CMA (September 17, 2010 10:08 AM)  Physical Exam  Additional Exam:  On physical exam he is a well-developed alert male  skin: anicteric HEENT: normocephalic; PEERLA; no nasal or pharyngeal abnormalities neck: supple nodes: no cervical lymphadenopathy chest: clear to ausculatation and percussion heart: no murmurs, gallops, or rubs abd: soft, nontender; BS normoactive; no abdominal masses, tenderness, organomegaly rectal: deferred ext: no cynanosis, clubbing, edema skeletal: no deformities neuro: oriented x 3; no focal abnormalities    Impression & Recommendations:  Problem # 1:  FM HX MALIGNANT NEOPLASM GASTROINTESTINAL TRACT (ICD-V16.0)  Plan screening colonoscopy.  Coumadin will be held in advance of the procedure provided this is approved by cardiology.  Risks, alternatives, and complications of the procedure, including bleeding, perforation, and possible need for surgery, were explained to the patient.  Patient's questions were answered.  Orders: Colonoscopy (Colon)  Problem # 2:  HEMORRHAGE OF RECTUM AND ANUS (ICD-569.3)  This likely is due to hemorrhoidal bleeding.  The more proximal colonic bleeding source will be ruled out by colonoscopy.  Orders: Colonoscopy (Colon)  Problem # 3:  COUMADIN THERAPY (ICD-V58.61) Assessment: Comment Only  Problem # 4:  GLAUCOMA (ICD-365.9) Assessment: Comment Only  Problem # 5:  PAROXYSMAL ATRIAL FIBRILLATION (ICD-427.31) Assessment: Comment Only  Problem # 6:  CAD (ICD-414.00) Assessment: Comment Only  Patient Instructions: 1)  Copy sent to :  Amalia Hailey, MD 2)  Your colonoscopy is scheduled on 10/05/2010 at 2:30pm 3)  You can pick up your MoviPrep today 4)  Colonoscopy and Flexible Sigmoidoscopy brochure given.  5)  Conscious Sedation brochure given.  6)  The medication list was reviewed and reconciled.  All changed / newly prescribed medications were explained.  A complete medication list was provided to the patient / caregiver. Prescriptions: MOVIPREP 100 GM  SOLR (PEG-KCL-NACL-NASULF-NA ASC-C) As per prep instructions.  #1 x 0   Entered by:   Merri Ray CMA (AAMA)   Authorized by:   Louis Meckel MD   Signed by:   Merri Ray CMA (AAMA) on 09/17/2010   Method used:   Electronically to        [facility] [id number] (retail)       [address] [place], [place]  [address]       Ph: [phone]       Fax: [phone]   RxID:   [id number]

## 2010-09-30 NOTE — Letter (Signed)
Summary: Results Letter  [facility]  [address] [place], [place] [address]   Phone: [phone]  Fax: [phone]        September 17, 2010 MRN: [id number]    Vincent [name] [address] [place], [place]  [address]    Dear [name],  It is my pleasure to have treated you recently as a new patient in my office. I appreciate your confidence and the opportunity to participate in your care.  Since I do have a busy inpatient endoscopy schedule and office schedule, my office hours vary weekly. I am, however, available for emergency calls everyday through my office. If I am not available for an urgent office appointment, another one of our gastroenterologist will be able to assist you.  My well-trained staff are prepared to help you at all times. For emergencies after office hours, a physician from our Gastroenterology section is always available through my 24 hour answering service  Once again I welcome you as a new patient and I look forward to a happy and healthy relationship             Sincerely,  Louis Meckel MD  This letter has been electronically signed by your physician.  Appended Document: Results Letter letter mailed

## 2010-09-30 NOTE — Letter (Addendum)
Summary: Anticoagulation Modification Letter [place] TO [facility]  [address] [place], [place] [address]   Phone: [phone]  Fax: [phone]    September 17, 2010  Re:    Vincent [name] DOB:    [birth year] MRN:    [id number]    Dear Vincent [name]  We have scheduled the above patient for an endoscopic procedure. Our records show that  he/she is on anticoagulation therapy. Please advise as to how long the patient may come off their therapy of Coumadin  prior to the scheduled procedure(s) on 10/05/2010.   Please fax back/or route the completed form to Vincent [name]  at [phone] .  Thank you for your help with this matter.  Sincerely,  Merri Ray CMA Duncan Dull)   Physician Recommendation:  Hold Plavix 7 days prior ________________  Hold Coumadin 5 days prior ____________  Other ______________________________     Appended Document: Anticoagulation Modification Letter PT APPROVED TO COME OFF COUMADIN 5 DAYS PRIOR, SENDING LETTER TO BE SCANNED IN. PT NOTIFIED

## 2010-09-30 NOTE — Medication Information (Signed)
Summary: rov/sp  Anticoagulant Therapy  Managed by: Weston Brass, PharmD Referring MD: Valera Castle MD PCP: Dr. Reinaldo Raddle MD: Gala Romney MD, Reuel Boom Indication 1: atrial fibrillation?? 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 no    Recent/future dental: no  Any missed doses?: no       Is patient compliant with meds? yes       Allergies: No Known Drug Allergies  Anticoagulation Management History:      The patient is taking warfarin and comes in today for a routine follow up visit.  Positive risk factors for bleeding include an age of 45 years or older and history of CVA/TIA.  The bleeding index is 'intermediate risk'.  Positive CHADS2 values include History of HTN and Prior Stroke/CVA/TIA.  Negative CHADS2 values include Age > 46 years old.  The start date was 10/22/2001.  His last INR was 6.6 ratio.  Anticoagulation responsible provider: Sheba Whaling MD, Reuel Boom.  INR POC: 2.6.  Cuvette Lot#: [id number].  Exp: 08/2011.    Anticoagulation Management Assessment/Plan:      The patient's current anticoagulation dose is Warfarin sodium 2 mg tabs: Use as directed by Anticoagulation Clinic.  The target INR is 2.0-3.0.  The next INR is due 10/12/2010.  Anticoagulation instructions were given to patient/sister.  Results were reviewed/authorized by Weston Brass, PharmD.  He was notified by Linward Headland, PharmD candidate.         Prior Anticoagulation Instructions: INR 3.0  Continue same dose of 2 tablets every day except 1 tablet on Wednesday and Saturday.  Recheck INR in 4 weeks.   Current Anticoagulation Instructions: INR 2.6 (goal INR: 2-3)  Take 2 tablets everyday except 1 tablet on Wednesdays and Saturdays.

## 2010-09-30 NOTE — Medication Information (Signed)
Summary: rov/sp  Anticoagulant Therapy  Managed by: Weston Brass, PharmD Referring MD: Valera Castle MD PCP: Dr. Reinaldo Raddle MD: Daleen Squibb MD, Maisie Fus Indication 1: atrial fibrillation?? 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 no    Recent/future dental: no  Any missed doses?: no       Is patient compliant with meds? yes       Allergies: No Known Drug Allergies  Anticoagulation Management History:      The patient is taking warfarin and comes in today for a routine follow up visit.  Positive risk factors for bleeding include an age of 30 years or older and history of CVA/TIA.  The bleeding index is 'intermediate risk'.  Positive CHADS2 values include History of HTN and Prior Stroke/CVA/TIA.  Negative CHADS2 values include Age > 72 years old.  The start date was 10/22/2001.  His last INR was 6.6 ratio.  Anticoagulation responsible Patton Rabinovich: Daleen Squibb MD, Maisie Fus.  INR POC: 3.0.  Cuvette Lot#: [id number].  Exp: 08/2011.    Anticoagulation Management Assessment/Plan:      The patient's current anticoagulation dose is Warfarin sodium 2 mg tabs: Use as directed by Anticoagulation Clinic.  The target INR is 2.0-3.0.  The next INR is due 09/14/2010.  Anticoagulation instructions were given to patient/sister.  Results were reviewed/authorized by Weston Brass, PharmD.  He was notified by Weston Brass PharmD.         Prior Anticoagulation Instructions: INR 2.5  Continue same dose of 2 tablets every day except 1 tablet on Wednesday and Saturday.  Recheck INR in 2 weeks.    Current Anticoagulation Instructions: INR 3.0  Continue same dose of 2 tablets every day except 1 tablet on Wednesday and Saturday.  Recheck INR in 4 weeks.

## 2010-10-05 ENCOUNTER — Other Ambulatory Visit (AMBULATORY_SURGERY_CENTER): Payer: Medicare Other | Admitting: Gastroenterology

## 2010-10-05 ENCOUNTER — Other Ambulatory Visit: Payer: Self-pay | Admitting: Gastroenterology

## 2010-10-05 DIAGNOSIS — D126 Benign neoplasm of colon, unspecified: Secondary | ICD-10-CM

## 2010-10-05 DIAGNOSIS — K5730 Diverticulosis of large intestine without perforation or abscess without bleeding: Secondary | ICD-10-CM

## 2010-10-05 DIAGNOSIS — Z1211 Encounter for screening for malignant neoplasm of colon: Secondary | ICD-10-CM

## 2010-10-06 NOTE — Letter (Signed)
Summary: Anticoagulation/[facility]  Anticoagulation/[facility]   Imported By: Sherian Rein 09/27/2010 12:09:21  _____________________________________________________________________  External Attachment:    Type:   Image     Comment:   External Document

## 2010-10-12 ENCOUNTER — Encounter: Payer: Self-pay | Admitting: Gastroenterology

## 2010-10-12 ENCOUNTER — Encounter: Payer: Self-pay | Admitting: Cardiology

## 2010-10-12 ENCOUNTER — Encounter (INDEPENDENT_AMBULATORY_CARE_PROVIDER_SITE_OTHER): Payer: Medicare Other

## 2010-10-12 DIAGNOSIS — Z7901 Long term (current) use of anticoagulants: Secondary | ICD-10-CM

## 2010-10-12 DIAGNOSIS — I4891 Unspecified atrial fibrillation: Secondary | ICD-10-CM

## 2010-10-12 DIAGNOSIS — I6350 Cerebral infarction due to unspecified occlusion or stenosis of unspecified cerebral artery: Secondary | ICD-10-CM

## 2010-10-12 LAB — CONVERTED CEMR LAB: POC INR: 1.4

## 2010-10-14 NOTE — Procedures (Signed)
Summary: Cardiology Device Clinic   Current Medications (verified): 1)  Cvs Loratadine 10 Mg  Tabs (Loratadine) .... Once Daily 2)  Pepcid 20 Mg  Tabs (Famotidine) .... Two Times A Day 3)  Adult Aspirin Low Strength 81 Mg  Tbdp (Aspirin) .... Once Daily 4)  Spironolactone 25 Mg  Tabs (Spironolactone) .... Once Daily 5)  Micardis 40 Mg  Tabs (Telmisartan) .... Once Daily 6)  Warfarin Sodium 2 Mg Tabs (Warfarin Sodium) .... Use As Directed By Anticoagulation Clinic 7)  Meclizine Hcl 25 Mg  Tabs (Meclizine Hcl) .... As Needed 8)  Nitrolingual 0.4 Mg/spray Soln (Nitroglycerin) .... One Spray Under Tongue Every 5 Minutes As Needed For Chest Pain---May Repeat Times Three 9)  Lumigan 0.03 % Soln (Bimatoprost) .Marland Kitchen.. 1 Drop Ou Every Evening 10)  Alphagan P 0.1 % Soln (Brimonidine Tartrate) .... Use 1 Drop in Right Eye Twice Daily. 11)  Simvastatin 40 Mg Tabs (Simvastatin) .... One in The Evening 12)  Sudafed 30 Mg Tabs (Pseudoephedrine Hcl) .... As Needed 13)  Moviprep 100 Gm  Solr (Peg-Kcl-Nacl-Nasulf-Na Asc-C) .... As Per Prep Instructions.  Allergies (verified): No Known Drug Allergies   ILR Following MD Sherryl Manges, MD DOI:  10/19/2007 Vendor:  [employer]     Model Number:  [id number]     Serial Number [id number]       Tachy Episodes:  66     Brady Episodes:  7 ILR Next Due 12/28/2010  Tech Comments:  66 TACHY EPISODES---NOISE AND 7 BRADY EPISODES--UNDERSENSING.  BATTERY LONGEVITY 12 MTHS. ROV IN 3 MTHS W/DEVICE CLINIC. Vella Kohler  October 03, 2010 12:32 PM

## 2010-10-14 NOTE — Cardiovascular Report (Signed)
Summary: Office Visit   Office Visit   Imported By: Roderic Ovens 10/05/2010 15:58:33  _____________________________________________________________________  External Attachment:    Type:   Image     Comment:   External Document

## 2010-10-20 NOTE — Medication Information (Signed)
Summary: Coumadin Clinic  Anticoagulant Therapy  Managed by: Cloyde Reams, RN, BSN Referring MD: Valera Castle MD PCP: Amalia Hailey, MD  Supervising MD: Jens Som MD, Arlys John Indication 1: atrial fibrillation?? 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 no    Recent/future dental: no  Any missed doses?: yes     Details: Held Coumadin prior to colonoscopy, resumed x 1 week ago.   Is patient compliant with meds? yes       Allergies: No Known Drug Allergies  Anticoagulation Management History:      The patient is taking warfarin and comes in today for a routine follow up visit.  Positive risk factors for bleeding include an age of 73 years or older and history of CVA/TIA.  The bleeding index is 'intermediate risk'.  Positive CHADS2 values include History of HTN and Prior Stroke/CVA/TIA.  Negative CHADS2 values include Age > 66 years old.  The start date was 10/22/2001.  His last INR was 6.6 ratio.  Anticoagulation responsible provider: Jens Som MD, Arlys John.  INR POC: 1.4.  Cuvette Lot#: [id number].  Exp: 08/2011.    Anticoagulation Management Assessment/Plan:      The patient's current anticoagulation dose is Warfarin sodium 2 mg tabs: Use as directed by Anticoagulation Clinic.  The target INR is 2.0-3.0.  The next INR is due 10/26/2010.  Anticoagulation instructions were given to patient/sister.  Results were reviewed/authorized by Cloyde Reams, RN, BSN.  He was notified by Cloyde Reams RN.         Prior Anticoagulation Instructions: INR 2.6 (goal INR: 2-3)  Take 2 tablets everyday except 1 tablet on Wednesdays and Saturdays.    Current Anticoagulation Instructions: INR 1.4  Take 3 tablets today, then resume 2 tablets daily except  1 tablet on Wednesdays and Saturdays.  Recheck in 2 weeks.

## 2010-10-20 NOTE — Procedures (Signed)
Summary: Colonoscopy   Colonoscopy  Procedure date:  10/05/2010  Findings:      Location:  [facility].   COLONOSCOPY PROCEDURE REPORT  PATIENT:  Vincent [name], Vincent [name]  MR#:  [id number] BIRTHDATE:   07-06-1936, 75 yrs. old   GENDER:   male  ENDOSCOPIST:   Barbette Hair. Arlyce Dice, MD Referred by: Clyda Greener, M.D.  PROCEDURE DATE:  10/05/2010 PROCEDURE:  Colonoscopy with snare polypectomy ASA CLASS:   Class II INDICATIONS: 1) Elevated Risk Screening  2) family history of colon cancer Mother age 95  MEDICATIONS:    Fentanyl 50 mcg IV, Versed 5 mg IV  DESCRIPTION OF PROCEDURE:   After the risks benefits and alternatives of the procedure were thoroughly explained, informed consent was obtained.  Digital rectal exam was performed and revealed no abnormalities.   The LB 180AL [id number] endoscope was introduced through the anus and advanced to the cecum, which was identified by both the appendix and ileocecal valve, without limitations.  The quality of the prep was excellent, using MoviPrep.  The instrument was then slowly withdrawn as the colon was fully examined. <<PROCEDUREIMAGES>>                  <<OLD IMAGES>>  FINDINGS:  A pedunculated polyp was found in the cecum. It was 1 cm in size. Polyp was snared, then cauterized with monopolar cautery. Retrieval was successful (see image4). snare polyp  A sessile polyp was found in the ascending colon. It was 3 mm in size. Polyp was snared without cautery. Retrieval was successful (see image6). snare polyp  A sessile polyp was found in the descending colon.. It was 10 mm in size. Polyp was snared without cautery. Retrieval was successful (see image9). snare polyp  A sessile polyp was found in the sigmoid.. It was 8 mm in size. It was found 20 cm from the point of entry. Polyp was snared without cautery. Retrieval was successful (see image10). snare polyp  Mild diverticulosis was found in the sigmoid to descending colon segments (see image12).  This  was otherwise a normal examination of the colon (see image1, image7, image8, image14, and image16).   Retroflexed views in the rectum revealed no abnormalities.    The time to cecum =  2.75  minutes. The scope was then withdrawn (time =  13.10  min) from the patient and the procedure completed.  COMPLICATIONS:   None  ENDOSCOPIC IMPRESSION:  1) 1 cm pedunculated polyp in the cecum  2) 3 mm sessile polyp in the ascending colon  3) 10 mm sessile polyp  4) 8 mm sessile polyp  5) Mild diverticulosis in the sigmoid to descending colon segments  6) Otherwise normal examination RECOMMENDATIONS:  1) Given your significant family history of colon cancer, you should have a repeat colonoscopy in 5 years 2) resume coumadin in 1 day  REPEAT EXAM:    5 year(s)   _______________________________ Barbette Hair. Arlyce Dice, MD    CC:     Appended Document: Colonoscopy     Procedures Next Due Date:    Colonoscopy: 09/2015

## 2010-10-20 NOTE — Letter (Signed)
Summary: Patient Notice- Polyp Results  [facility]  [address] [place], [place] [address]   Phone: [phone]  Fax: [phone]        October 12, 2010 MRN: [id number]    Vincent [name] [address] [place], [place]  [address]    Dear [name],  I am pleased to inform you that the colon polyp(s) removed during your recent colonoscopy was (were) found to be benign (no cancer detected) upon pathologic examination.  I recommend you have a repeat colonoscopy examination in 5_ years to look for recurrent polyps, as having colon polyps increases your risk for having recurrent polyps or even colon cancer in the future.  Should you develop new or worsening symptoms of abdominal pain, bowel habit changes or bleeding from the rectum or bowels, please schedule an evaluation with either your primary care physician or with me.  Additional information/recommendations:  __ No further action with gastroenterology is needed at this time. Please      follow-up with your primary care physician for your other healthcare      needs.  __ Please call [phone] to schedule a return visit to review your      situation.  __ Please keep your follow-up visit as already scheduled.  _x_ Continue treatment plan as outlined the day of your exam.  Please call us if you are having persistent problems or have questions about your condition that have not been fully answered at this time.  Sincerely,  Louis Meckel MD  This letter has been electronically signed by your physician.  Appended Document: Patient Notice- Polyp Results letter mailed

## 2010-10-21 ENCOUNTER — Emergency Department (HOSPITAL_COMMUNITY)
Admission: EM | Admit: 2010-10-21 | Discharge: 2010-10-22 | Disposition: A | Discharge status: Home / Self Care | Payer: Medicare Other | Attending: Emergency Medicine | Admitting: Emergency Medicine

## 2010-10-21 DIAGNOSIS — I509 Heart failure, unspecified: Secondary | ICD-10-CM | POA: Insufficient documentation

## 2010-10-21 DIAGNOSIS — I10 Essential (primary) hypertension: Secondary | ICD-10-CM | POA: Insufficient documentation

## 2010-10-21 DIAGNOSIS — L299 Pruritus, unspecified: Secondary | ICD-10-CM | POA: Insufficient documentation

## 2010-10-21 DIAGNOSIS — K219 Gastro-esophageal reflux disease without esophagitis: Secondary | ICD-10-CM | POA: Insufficient documentation

## 2010-10-21 DIAGNOSIS — Z7901 Long term (current) use of anticoagulants: Secondary | ICD-10-CM | POA: Insufficient documentation

## 2010-10-21 DIAGNOSIS — Z79899 Other long term (current) drug therapy: Secondary | ICD-10-CM | POA: Insufficient documentation

## 2010-10-21 DIAGNOSIS — Z8673 Personal history of transient ischemic attack (TIA), and cerebral infarction without residual deficits: Secondary | ICD-10-CM | POA: Insufficient documentation

## 2010-10-21 DIAGNOSIS — I2510 Atherosclerotic heart disease of native coronary artery without angina pectoris: Secondary | ICD-10-CM | POA: Insufficient documentation

## 2010-10-21 DIAGNOSIS — L259 Unspecified contact dermatitis, unspecified cause: Secondary | ICD-10-CM | POA: Insufficient documentation

## 2010-10-26 ENCOUNTER — Encounter: Payer: Self-pay | Admitting: Cardiology

## 2010-10-26 ENCOUNTER — Encounter (INDEPENDENT_AMBULATORY_CARE_PROVIDER_SITE_OTHER): Payer: Medicare Other

## 2010-10-26 DIAGNOSIS — Z7901 Long term (current) use of anticoagulants: Secondary | ICD-10-CM

## 2010-10-26 DIAGNOSIS — I4891 Unspecified atrial fibrillation: Secondary | ICD-10-CM

## 2010-10-26 LAB — CONVERTED CEMR LAB: POC INR: 3.3

## 2010-11-04 NOTE — Medication Information (Signed)
Summary: rov/kh  Anticoagulant Therapy  Managed by: Tammy Sours, PharmD  Referring MD: Valera Castle MD PCP: Amalia Hailey, MD  Supervising MD: Shirlee Latch MD, Tristyn Demarest Indication 1: atrial fibrillation?? 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 no    Recent/future dental: no  Any missed doses?: no       Is patient compliant with meds? yes       Allergies: No Known Drug Allergies  Anticoagulation Management History:      The patient is taking warfarin and comes in today for a routine follow up visit.  Positive risk factors for bleeding include an age of 73 years or older and history of CVA/TIA.  The bleeding index is 'intermediate risk'.  Positive CHADS2 values include History of HTN and Prior Stroke/CVA/TIA.  Negative CHADS2 values include Age > 73 years old.  The start date was 10/22/2001.  His last INR was 6.6 ratio.  Anticoagulation responsible provider: Shirlee Latch MD, Philopater Mucha.  INR POC: 3.3.  Cuvette Lot#: [id number].  Exp: 08/2011.    Anticoagulation Management Assessment/Plan:      The patient's current anticoagulation dose is Warfarin sodium 2 mg tabs: Use as directed by Anticoagulation Clinic.  The target INR is 2.0-3.0.  The next INR is due 11/09/2010.  Anticoagulation instructions were given to patient/sister.  Results were reviewed/authorized by Tammy Sours, PharmD .  He was notified by Tammy Sours PharmD.         Prior Anticoagulation Instructions: INR 1.4  Take 3 tablets today, then resume 2 tablets daily except 1 tablet on Wednesdays and Saturdays.  Recheck in 2 weeks.   Current Anticoagulation Instructions: INR 3.3  Take only 1 tablet today. Then resume taking 2 tablets daily except 1 tablet on Wednesdays and  Saturdays. Recheck INR in 2 weeks.

## 2010-11-04 NOTE — Procedures (Signed)
Summary: [facility]: Physician Recommendation  [facility]: Physician Recommendation   Imported By: Earl Many 10/28/2010 16:09:44  _____________________________________________________________________  External Attachment:    Type:   Image     Comment:   External Document

## 2010-11-09 ENCOUNTER — Encounter: Payer: Self-pay | Admitting: Cardiovascular Disease

## 2010-11-09 ENCOUNTER — Encounter (INDEPENDENT_AMBULATORY_CARE_PROVIDER_SITE_OTHER): Payer: Medicare Other

## 2010-11-09 DIAGNOSIS — I6789 Other cerebrovascular disease: Secondary | ICD-10-CM

## 2010-11-09 DIAGNOSIS — I4891 Unspecified atrial fibrillation: Secondary | ICD-10-CM

## 2010-11-09 DIAGNOSIS — Z7901 Long term (current) use of anticoagulants: Secondary | ICD-10-CM

## 2010-11-09 LAB — CONVERTED CEMR LAB: POC INR: 1.4

## 2010-11-16 NOTE — Medication Information (Signed)
Summary: rov/kh  Anticoagulant Therapy  Managed by: Bethena Midget, RN, BSN Referring MD: Valera Castle MD PCP: Amalia Hailey, MD  Supervising MD: Clifton James MD, Cristal Deer Indication 1: atrial fibrillation?? (ICD-427.31) Indication 2: stroke-embolic (ICD-436.0) Lab Used: [facility] Site: [facility] INR POC 1.4 INR RANGE 2 - 3  Dietary changes: yes       Details: Did eat some extra leafy veggies  Health status changes: no    Bleeding/hemorrhagic complications: yes       Details: Pt states he did have some blood in stool prior to his colonoscopy in Feb. but blood still remains and it was heavy last week, encouraged pt to call Dr Nita Sells office to make them aware.   Recent/future hospitalizations: no    Any changes in medication regimen? no    Recent/future dental: no  Any missed doses?: yes     Details: Pt states he did miss 2 doses last week.   Is patient compliant with meds? yes       Allergies: No Known Drug Allergies  Anticoagulation Management History:      The patient is taking warfarin and comes in today for a routine follow up visit.  Positive risk factors for bleeding include an age of 73 years or older and history of CVA/TIA.  The bleeding index is 'intermediate risk'.  Positive CHADS2 values include History of HTN and Prior Stroke/CVA/TIA.  Negative CHADS2 values include Age > 73 years old.  The start date was 10/22/2001.  His last INR was 6.6 ratio.  Anticoagulation responsible provider: Clifton James MD, Cristal Deer.  INR POC: 1.4.  Cuvette Lot#: [id number].  Exp: 09/2011.    Anticoagulation Management Assessment/Plan:      The patient's current anticoagulation dose is Warfarin sodium 2 mg tabs: Use as directed by Anticoagulation Clinic.  The target INR is 2.0-3.0.  The next INR is due 11/23/2010.  Anticoagulation instructions were given to patient/sister.  Results were reviewed/authorized by Bethena Midget, RN, BSN.  He was notified by Cloyde Reams RN.         Prior  Anticoagulation Instructions: INR 3.3  Take only 1 tablet today. Then resume taking 2 tablets daily except 1 tablet on Wednesdays and Saturdays. Recheck INR in 2 weeks.   Current Anticoagulation Instructions: INR 1.4 Today take 3 tablets then resume 2 tablets everyday except 1 tablet on Wednesdays and Saturdays. Recheck in 2 weeks.

## 2010-11-22 ENCOUNTER — Other Ambulatory Visit: Payer: Self-pay | Admitting: Cardiology

## 2010-11-23 ENCOUNTER — Ambulatory Visit (INDEPENDENT_AMBULATORY_CARE_PROVIDER_SITE_OTHER): Payer: Medicare Other | Admitting: *Deleted

## 2010-11-23 DIAGNOSIS — I4891 Unspecified atrial fibrillation: Secondary | ICD-10-CM

## 2010-11-23 DIAGNOSIS — I6350 Cerebral infarction due to unspecified occlusion or stenosis of unspecified cerebral artery: Secondary | ICD-10-CM

## 2010-11-23 DIAGNOSIS — Z7901 Long term (current) use of anticoagulants: Secondary | ICD-10-CM

## 2010-11-23 LAB — POCT INR: INR: 2.5

## 2010-11-23 NOTE — Patient Instructions (Signed)
INR 1.7 Continue 2 pills everyday except 1 pill on Wednesdays and Saturdays. Recheck on 12/14/10

## 2010-11-24 ENCOUNTER — Other Ambulatory Visit: Payer: Self-pay | Admitting: *Deleted

## 2010-11-24 MED ORDER — WARFARIN SODIUM 2 MG PO TABS: 2.0000 mg | ORAL_TABLET | ORAL | Status: DC

## 2010-11-25 NOTE — Telephone Encounter (Signed)
[address]

## 2010-11-26 NOTE — Telephone Encounter (Signed)
Pt already called in as per pharmacy today

## 2010-11-29 ENCOUNTER — Other Ambulatory Visit: Payer: Self-pay | Admitting: Cardiology

## 2010-12-14 ENCOUNTER — Ambulatory Visit (INDEPENDENT_AMBULATORY_CARE_PROVIDER_SITE_OTHER): Payer: Medicare Other | Admitting: *Deleted

## 2010-12-14 DIAGNOSIS — I4891 Unspecified atrial fibrillation: Secondary | ICD-10-CM

## 2010-12-14 DIAGNOSIS — I6350 Cerebral infarction due to unspecified occlusion or stenosis of unspecified cerebral artery: Secondary | ICD-10-CM

## 2010-12-14 DIAGNOSIS — Z7901 Long term (current) use of anticoagulants: Secondary | ICD-10-CM

## 2010-12-14 LAB — POCT INR: INR: 1.9

## 2010-12-16 ENCOUNTER — Other Ambulatory Visit (HOSPITAL_COMMUNITY): Payer: Self-pay | Admitting: Family Medicine

## 2010-12-16 ENCOUNTER — Ambulatory Visit (HOSPITAL_COMMUNITY)
Admission: RE | Admit: 2010-12-16 | Discharge: 2010-12-16 | Disposition: A | Discharge status: Home / Self Care | Payer: Medicare Other | Source: Ambulatory Visit | Attending: Family Medicine | Admitting: Family Medicine

## 2010-12-16 DIAGNOSIS — R030 Elevated blood-pressure reading, without diagnosis of hypertension: Secondary | ICD-10-CM | POA: Insufficient documentation

## 2010-12-16 DIAGNOSIS — I498 Other specified cardiac arrhythmias: Secondary | ICD-10-CM | POA: Insufficient documentation

## 2010-12-16 DIAGNOSIS — Z01812 Encounter for preprocedural laboratory examination: Secondary | ICD-10-CM | POA: Insufficient documentation

## 2010-12-16 DIAGNOSIS — I509 Heart failure, unspecified: Secondary | ICD-10-CM

## 2010-12-16 DIAGNOSIS — R079 Chest pain, unspecified: Secondary | ICD-10-CM | POA: Insufficient documentation

## 2010-12-16 LAB — COMPREHENSIVE METABOLIC PANEL
ALT: 12 U/L (ref 0–53)
AST: 16 U/L (ref 0–37)
Albumin: 3.7 g/dL (ref 3.5–5.2)
Alkaline Phosphatase: 72 U/L (ref 39–117)
BUN: 9 mg/dL (ref 6–23)
CO2: 29 mEq/L (ref 19–32)
Calcium: 9 mg/dL (ref 8.4–10.5)
Chloride: 105 mEq/L (ref 96–112)
Creatinine, Ser: 0.87 mg/dL (ref 0.4–1.5)
GFR calc Af Amer: >60 mL/min (ref >60)
GFR calc non Af Amer: >60 mL/min (ref >60)
Glucose, Bld: 115 mg/dL — ABNORMAL HIGH (ref 70–99)
Potassium: 4.3 mEq/L (ref 3.5–5.1)
Sodium: 139 mEq/L (ref 135–145)
Total Bilirubin: 0.8 mg/dL (ref 0.3–1.2)
Total Protein: 7.9 g/dL (ref 6.0–8.3)

## 2010-12-16 LAB — CBC
HCT: 36.9 % — ABNORMAL LOW (ref 39.0–52.0)
Hemoglobin: 11.6 g/dL — ABNORMAL LOW (ref 13.0–17.0)
MCH: 24.2 pg — ABNORMAL LOW (ref 26.0–34.0)
MCHC: 31.4 g/dL (ref 30.0–36.0)
MCV: 77 fL — ABNORMAL LOW (ref 78.0–100.0)
Platelets: 242 10*3/uL (ref 150–400)
RBC: 4.79 MIL/uL (ref 4.22–5.81)
RDW: 15.7 % — ABNORMAL HIGH (ref 11.5–15.5)
WBC: 4.1 10*3/uL (ref 4.0–10.5)

## 2010-12-16 LAB — LIPID PANEL
Cholesterol: 182 mg/dL (ref 0–200)
HDL: 49 mg/dL (ref >39)
LDL Cholesterol: 115 mg/dL — ABNORMAL HIGH (ref 0–99)
Total CHOL/HDL Ratio: 3.7 RATIO
Triglycerides: 92 mg/dL (ref <150)
VLDL: 18 mg/dL (ref 0–40)

## 2010-12-16 LAB — TSH: TSH: 1.073 u[IU]/mL (ref 0.350–4.500)

## 2010-12-16 LAB — PSA: PSA: 2.44 ng/mL (ref <=4.00)

## 2011-01-11 ENCOUNTER — Encounter: Payer: Medicare Other | Admitting: *Deleted
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
## 2011-01-11 NOTE — Assessment & Plan Note (Signed)
[facility]                            CARDIOLOGY OFFICE NOTE   NAME:Simcoe, KEIONTE SWICEGOOD                       MRN:          [id number]  DATE:02/25/2008                            DOB:          Jan 07, 1936    Mr. Kluger comes in today for followup of his loop recorder.  He had a  hypotensive, induced bradycardic partial seizure, which required  admission back in February 2009.  Please see my note from November 23, 2007.  He had an extensive neurological and cardiac workup at that time.   We released him back to drive on last visit.  He is having no symptoms  of presyncope, syncope, or chest pain.   He is on no rate-slowing drugs.   We interrogated his loop recorder today and he had some sinus  bradycardia at the rate of 39 beats per minute, which has actually been  recorded in the hospital.  This is asymptomatic.   His meds are unchanged since his last visit.   PHYSICAL EXAMINATION:  GENERAL:  Very pleasant.  VITAL SIGNS:  His blood pressure is 157/71.  His heart rate is 50 and  regular.  His weight is 193 and stable.  HEENT:  Unchanged.  NECK:  Carotids are full.  No JVD.  LUNGS:  Clear.  HEART:  Slow rate and rhythm.  No gallop.  ABDOMEN:  Soft.  Good bowel sounds.  EXTREMITIES:  No edema.  Pulses are intact.  NEUROLOGIC:  Grossly intact and baseline.   Mr. Wilbon is stable from our standpoint.  I see no indication at  present for permanent pacemaker implantation.  We will have him return  in 3 months for followup.  If he is still asymptomatic at that time and  having nothing more than sinus bradycardia, we will probably have Dr.  Graciela Husbands to remove his loop recorder.     Thomas C. Daleen Squibb, MD, [facility]  Electronically Signed    TCW/MedQ  DD: 02/25/2008  DT: 02/26/2008  Job #: [id number]

## 2011-01-11 NOTE — Assessment & Plan Note (Signed)
[facility]                         ELECTROPHYSIOLOGY OFFICE NOTE   NAME:Vincent [name], Vincent [name]                       MRN:          [id number]  DATE:02/05/2008                            DOB:          [birth year]    Vincent [name] is seen following implantation of a loop recorder in February  because of spells of dizziness described as presyncopal versus partial  seizures.  There is also bradycardia and he has a history of ischemic  and nonischemic cardiomyopathy with prior PCI and previously depressed  and now near-normal left ventricular function.   He has had no intercurrent spells.  He has no problems with chest pain  or shortness of breath.   Medications are unchanged and include:  1. Pepcid 20 mg.  2. Aspirin 81 mg.  3. Potassium.  4. Lasix.  5. Vytorin 10/40 mg.  6. Spironolactone 25 mg daily.  7. Coumadin.  8. Micardis 40 mg.   EXAMINATION:  His blood pressure today was 145/65 with a pulse of 69.  The weight was 192, which is stable.  LUNGS:  Clear.  The neck veins were flat.  The heart sounds were regular.  EXTREMITIES:  Without edema.   Interrogation of his [employer] Confirm recorder demonstrated a multitude  of episodes that were recorded as asystole which are, in fact,  nonsustained ventricular tachycardia at a rate of approximately 400 msec  or 130-150 beats per minute.  The longest of these was 10 beats.  There  are also some slow accelerated idioventricular rhythms bradycardias in  the 40s, which is not a surprise.   IMPRESSION:  1. Recurrent spells.  2. History of ischemic and nonischemic cardiomyopathy with previously      depressed and now normal left ventricular function and prior      percutaneous coronary intervention.  3. Paroxysmal atrial fibrillation.  4. Orthostatic intolerance.  5. Nonsustained ventricular tachycardia identified on the monitor.   Vincent [name] be is without recurrent symptoms.  He has a multitude of  arrhythmias.  The question is, what is the significance of them, and we  do not know yet.   We will plan to continue to follow him along.   He will see me in 3 months' time in the device clinic.  He needs to call  if he has intercurrent symptoms.     Duke Salvia, MD, [facility]  Electronically Signed    SCK/MedQ  DD: 02/05/2008  DT: 02/05/2008  Job #: [id number]

## 2011-01-11 NOTE — Assessment & Plan Note (Signed)
[facility]                            CARDIOLOGY OFFICE NOTE   NAME:Vincent [name], Vincent [name]                       MRN:          [id number]  DATE:11/23/2007                            DOB:          12-25-1935    [name] be returns today after being discharged from the hospital  several weeks ago for syncope and what appeared to be perhaps a  hypotensive-induced/bradycardic partial seizure.   Please see the extensive discharge summary and evaluation on October 19, 2007.   He was seen in consultation by Dr. Nash Shearer of neurology.  An EEG showed  slowing of the right temporal lobe probably secondary to his previous  right middle cerebral artery stroke.  He also had an MRI that showed no  acute changes.   During his stay he had some bradycardia, which he has had the past.  Dr.  Berton Mount saw him and placed a loop recorder.  He has a followup with  Dr. Graciela Husbands this summer.  He has had no further events, however.   He denies any orthopnea, PND or peripheral edema.  He has had no  palpitations.  He does have some dizziness when he bends over or stands  up too quickly.  This, I think, is expected.   He has a history of coronary disease, status post drug-eluting stent in  the right coronary artery in May of 2004.  His last cath showed an EF of  60%.  The LAD had 30-40% stenosis, 40% of distal circumflex, 40% in the  proximal right, 30% distal to the stent.  He had a history paroxysmal  atrial fibrillation, on chronic Coumadin.  He has a history of systolic  heart failure with LV dysfunction with near recovery of his LV function.  Most this was due to abstinence from previous alcohol abuse.  He has a  history of hypertension, dyslipidemia, which are being treated  aggressively.   He is back to driving.  He has had no events.   During his stay he also had a 2D echocardiogram that showed an EF of 60%  with no regional wall motion abnormalities.  Valvular  function was  normal.  There is no evidence of pulmonary retention.   His sister accompanies him today as she always does.   He offers no complaints except that he feels compressed by her and does  not at need to be interrupted on his computer and also does not need  her to explain to him how to avoid his dizziness.   This was quite a bit of tension in the room today.   His meds are loratadine 10 mg a day, Pepcid 20 mg p.o. b.i.d., aspirin  81 mg a day, potassium 20 mg b.i.d., Lasix 80 mg a day, Vytorin 10/40  daily, spironolactone 25 mg a day, Coumadin as directed, Micardis 40 mg  daily.   PHYSICAL EXAMINATION:  His blood pressure is 128/68, his pulse is 71.  His weight is 189 and stable.  HEENT:  No change.  NECK:  Carotids upstrokes were equal bilaterally without bruits.  No  JVD.  Thyroid is not enlarged.  Trachea is midline.  LUNGS:  Clear.  HEART:  Reveals a nondisplaced PMI.  Normal S1 and S2 without gallop.  ABDOMEN:  Soft, good bowel sounds.  No midline bruit.  No obvious  hepatomegaly.  EXTREMITIES:  No cyanosis or clubbing.  There is trace edema.  Pulses  are intact.  There is no sign of DVT.  NEURO:  Intact.  MUSCULOSKELETAL:  Shows some chronic arthritic changes.   ASSESSMENT/PLAN:  [name] seems to be stable from our standpoint.  He  has had no further episodes of presyncope and no sign of seizure  activity.  I suspect this was hypoperfusion/hypotension with or without  bradycardia in the office the day we admitted him.  He was actually  sitting up on the examining table when this happened.  The twitching and  the other things that we saw were probably related to the hypoperfusion  as well as the old scar tissue in his brain.   PLAN:  1. Continue current medications.  2. Follow up with Dr. Graciela Husbands with his loop recorder in several months.  3. Orthostatic precautions reviewed at length.  4. The patient may drive but obvious precautions need to be taken.      Thomas C. Daleen Squibb, MD, [facility]  Electronically Signed    TCW/MedQ  DD: 11/23/2007  DT: 11/24/2007  Job #: [id number]
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
## 2011-01-11 NOTE — Assessment & Plan Note (Signed)
[facility]                            CARDIOLOGY OFFICE NOTE   NAME:Thornton, ORESTE MAJEED                       MRN:          [id number]  DATE:03/30/2007                            DOB:          [birth year]    Mr. Haskin returns today for management of the following issues;  1. Coronary artery disease. He has non obstructive coronary disease by      heart catheterization 2006.  2. History of alcoholic cardiomyopathy now with near total recovery of      his LV function. He is having no symptoms of congestive heart      failure. He continues to obtain from alcohol.  3. Paroxysmal atrial fibrillation maintaining sinus rhythm.  4. History of thromboembolic cerebral vascular event, resolved.  5. Anticoagulation.  6. Hypertension.  7. Hyperlipidemia.   He has been evaluated recently by Dr. Sandrea Hughs for a cough. Dr. Sherene Sires  switched him from his [facility] to [facility] which has alleviated this  symptom.   He denies any orthopnea, PND, or peripheral edema. His functional  capacity is currently class 2. He is doing yard work, Catering manager.   His meds are;  1. Loratadine 10 mg a day.  2. Pepcid 20 mg b.i.d.  3. Aspirin 81 mg a day.  4. Potassium 20 mEq a day.  5. Lasix 80 mg a day.  6. Vytorin 10/40 daily.  7. Spironolactone 25 mg a day.  8. Coumadin as directed.  9. Micardis 40 mg a day.   PHYSICAL EXAMINATION:  Blood pressure is 122/68, his heart rate is 57  sinus brady, he has T wave changes inferiorly which are unchanged from  before. His weight is 187 which is stable.  HEENT: Normocephalic, atraumatic. PERRLA: Extraocular movements intact.  Sclera clear. Arcus senilis. Facial symmetry is normal. He has a beard.  He has appropriate graying of his hair and his beard.  NECK: Supple, carotid upstrokes are equal bilaterally without bruits. No  JVD. Thyroid is not enlarged. Trachea is midline.  LUNGS: Clear.  HEART: Reveals a non displaced PMI. He has no gallop.  Normal S1, S2.  ABDOMINAL EXAM: Soft with good bowel sounds. No midline bruits. There is  no hepatomegaly.  EXTREMITIES: Reveal no cyanosis, clubbing, or edema. Pulses are intact.  NEURO EXAM: Intact.  SKIN: Unremarkable.   ASSESSMENT/PLAN:  Mr. Fatzinger is doing well. I have made no change in his  program.   PLAN:  1. Fasting lipids, LFTs, and a chem 7 in September.  2. A 2D echocardiogram to follow up on his left ventricular function      and right-sided function.  3. See him back again in 6 months.     Thomas C. Daleen Squibb, MD, [facility]  Electronically Signed    TCW/MedQ  DD: 03/30/2007  DT: 03/30/2007  Job #: [id number]

## 2011-01-11 NOTE — Assessment & Plan Note (Signed)
[facility]                            CARDIOLOGY OFFICE NOTE   NAME:Blando, ODILON CASS                       MRN:          [id number]  DATE:06/08/2007                            DOB:          12-18-1935    SUBJECTIVE:  Mr. Kellogg returns today after presenting to [facility] with vertigo.  He was found to have sinus bradycardia in the  40's which increased into the 50's and 60's with ambulation.   We were consulted.  We felt that his vertigo was middle ear.  His sinus  bradycardia was asymptomatic.  He was on  no AV nodal blocking drugs.   MEDICATIONS:  He was sent home on Meclizine p.r.n. which has helped.  His vertigo has almost resolved.  His medications are unchanged  otherwise.   LABORATORY DATA:  Recent studies include a 2-D echocardiogram which  showed mild LV dilatation with EF of 50 to 55%.  No regional wall motion  abnormalities, mild mitral regurgitation.  His lipids were at goal.   PHYSICAL EXAMINATION:  VITAL SIGNS: His blood pressure today is 130/63,  pulse 52 and regular.  His electrocardiogram shows sinus bradycardia  with ST changes laterally and inferiorly which are stable.  Weight is  192.  HEENT: Unchanged.  NECK:  Carotids are full without bruits.  Thyroid is not enlarged.  Trachea is midline.  LUNGS:  Clear.  HEART:  Reveals a slow rate and rhythm.  There is no gallop.  ABDOMEN:  Soft.  EXTREMITIES:  Reveal no edema. Pulses are intact.   DISCUSSION:  I have reviewed with Mr. Bialy and his sister the symptoms  of symptomatic sinus bradycardia or AV block.  If those recur he will  let us know, otherwise we will see him back in six months.  I have made  no changes in his medical program.   Also note that his Chem-7 and liver function tests were normal as well.     Thomas C. Daleen Squibb, MD, [facility]  Electronically Signed    TCW/MedQ  DD: 06/08/2007  DT: 06/09/2007  Job #: [id number]

## 2011-01-11 NOTE — H&P (Signed)
Vincent [name], [name]:  [id number]   MEDICAL RECORD NO.:  [id number]          PATIENT TYPE:  OBV   LOCATION:  2024                         FACILITY:  [facility]   PHYSICIAN:  Rollene Rotunda, MD, FACCDATE OF BIRTH:  [birth year]   DATE OF ADMISSION:  05/17/2007  DATE OF DISCHARGE:  05/18/2007                              HISTORY & PHYSICAL   PRIMARY CARDIOLOGIST:  Dr. Juanito Doom   PRIMARY CARE PHYSICIAN:  Dr. Blunt   HISTORY OF PRESENT ILLNESS:  A 75 year old African-American male with  known history of CAD, stent to the right coronary artery, hypertension  and hypercholesterolemia with paroxysmal atrial fibrillation on Coumadin  therapy, who saw his primary care physician this a.m. secondary to  vertigo symptoms since 9:00 a.m. this morning.  The patient has had also  episodes of vertigo over the last week lasting several minutes with the  longest period approximately six days lasting half-a-day.  The patient  has had several episodes of vertigo since 2005 and did have a neuro  workup along with CT scans, etc. which were negative for CVA or bleed.  The patient is found to be bradycardic with ventricular rate in the 40s  and is not on any beta-blocker or digoxin, or any rate-reducing  medications.  The patient has been complaining of some slight nausea and  having feelings of no balance.  He had one episode of diaphoresis in his  MD's office and EKG revealed sinus bradycardia.  Secondary to this, Dr.  Clide Deutscher had him sent to the emergency room for further evaluation.  He  denies chest pain, shortness of breath, vomiting or diaphoresis.   REVIEW OF SYSTEMS:  As above, otherwise negative.   PAST MEDICAL HISTORY:  1. Nonobstructive coronary artery disease.        A:  Cardiac catheterization March 14, 2005 revealed a drug-eluting  stent to the mid-right coronary artery.  Left main:  No disease.  LAD:  40% proximal LAD.  Circumflex:  40% narrowing in the distant segment.  RCA:  40% proximal to the stent, 30% narrowing in the mid-vessel distal  to the stent.  1. Alcoholic cardiomyopathy by history.  2. Paroxysmal atrial fibrillation on Coumadin therapy.  3. History of thrombotic CVA.  4. History of hypertension.  5. History of hyperlipidemia.  6. History of nonischemic cardiomyopathy with an EF of 30% now      improved to an EF of 55 to 60% per recent echo in 2008 in      September.  7. History of GERD.  8. History of LV thrombus.  9. History of second-degree AV block, Mobitz I.   SOCIAL:  The patient lives in [place] with his wife.  He is a retired  Pensions consultant.  He stopped smoking 13 years ago and quit alcohol in 2003.   FAMILY HISTORY:  Mother died of a CVA, father died at 65 from an MI, and  he has three sisters with CAD.   CURRENT MEDICATIONS:  1. Loratadine 10 mg daily.  2. Pepcid 20 mg b.i.d.  3. Aspirin 81 mg once a  day.  4. Potassium 20 mEq once a day.  5. Lasix 80 mg once a day.  6. Vytorin 10/40 daily.  7. Spironolactone 25 mg daily.  8. Teveten per PT/INR.  9. Micardis 40 mg once a day.   ALLERGIES:  No known drug allergies.   CURRENT LABS:  Hemoglobin 11.7, hematocrit 35.5, white blood cells 4.8,  platelets 289.  Sodium 138, potassium 3.9, chloride 100, CO2 28, BUN 13,  creatinine 1.2, glucose 116.  PTT 34, PT 28.5, INR 2.6.  Point-of-care  markers:  Troponin less than 0.05, CK-MB 1.8, myoglobin 187.   EKG revealing sinus bradycardia with a ventricular rate of 40 beats per  minute.   VITAL SIGNS:  Blood pressure 132/68, heart rate 42, respirations 18,  temperature 98, O2 sat 96% on room air.  HEENT:  Head is normocephalic, atraumatic.  Eyes:  PERRLA.  Patient  wears glasses.  Mucous membranes and mouth:  Pink and moist.  Tongue is  midline.  NECK:  Supple.  There are no carotid bruits.  No JVD seen.  CARDIOVASCULAR:  Regular rate and rhythm, bradycardic with 1/6 systolic  murmur auscultated.  LUNGS:  Clear to auscultation  without wheezes, rales or rhonchi.  ABDOMEN:  Soft, nontender.  2+ bowel sounds.  EXTREMITIES:  Without clubbing, cyanosis or edema.  NEURO:  Cranial nerves II-XII are grossly intact.   IMPRESSION:  1. Questionable symptomatic bradycardia.  2. Coronary artery disease status post drug-eluting stent in the right      coronary artery with no nonobstructive coronary artery disease      elsewhere per cardiac catheterization in 2006.  3. History of hyperlipidemia.  4. History of paroxysmal atrial fibrillation on Coumadin therapy.   PLAN:  The patient has been seen and examined by Dr. Antoine Poche and myself  in the emergency room.  The patient complains of dizziness similar to  prior longstanding episodes.  He reports an extensive neuro workup in  the past.  Today, he was noted to have a severe episode of dizziness,  denies palpitations, chest pain.  Heart rate was found to be in the 40s.   The patient will be admitted and observed overnight on telemetry,  ambulate in the a.m. and evaluate heart rate.  Will treat with  meclizine.  If no brady arrhythmia is found and dizziness resolves, will  discharge to home in a.m. with outpatient Holter and possible ENT  evaluation.      Bettey Mare. Lyman Bishop, NP      Rollene Rotunda, MD, [facility]  Electronically Signed    KML/MEDQ  D:  05/17/2007  T:  05/18/2007  Job:  [id number]   cc:   Dr. Clide Deutscher
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
## 2011-01-11 NOTE — Discharge Summary (Signed)
Vincent [name], Vincent [name]:  [id number]   MEDICAL RECORD NO.:  [id number]          PATIENT TYPE:  OBV   LOCATION:  2024                         FACILITY:  [facility]   PHYSICIAN:  Jesse Sans. Wall, MD, FACCDATE OF BIRTH:  13-Dec-1935   DATE OF ADMISSION:  05/17/2007  DATE OF DISCHARGE:                               DISCHARGE SUMMARY   PRIMARY CARDIOLOGIST:  Dr. Juanito Doom   PRIMARY CARE:  Dr. [name]   PULMONARY:  Dr. Sandrea Hughs   DISCHARGING DIAGNOSES:  1. Vertigo, which is being treated with meclizine p.r.n.  2. Bradycardia, asymptomatic, with known history.   PAST MEDICAL HISTORY:  1. Coronary artery disease, nonobstructive, by catheterization in      2006.  2. Alcohol cardiomyopathy, now with near-total recovery of LV      function.  3. History of EtOH abuse.  4. Paroxysmal atrial fibrillation.  5. History of thromboembolic cerebrovascular event.  6. Chronic anticoagulation therapy.  7. Hypertension.  8. Hyperlipidemia.  9. ACE-inhibitor-induced cough.  10.Sinus bradycardia, asymptomatic.  11.History of second-degree AV block, type 1 Mobitz.  12.History of LV thrombus.  13.GERD.  14.History of congestive heart failure secondary to nonischemic      cardiomyopathy.   HOSPITAL COURSE:  [name] is a 75 year old gentleman followed by Dr.  Juanito Doom and Dr. [name] who presented to [facility] emergency room  complaining of vertigo with nausea and mild diaphoresis.  Apparently,  the patient presented to Dr. Daneil Dan office initially.  The patient was  noted to be bradycardic and sent to the emergency room for further  evaluation.  In the emergency room, the patient was seen by Dr.  Antoine Poche.  The patient noted to have heart rate of 40 by 12-lead EKG.  When the patient ambulated heart rate increased.  Vertigo treated with  meclizine.  The patient admitted for 23-hour observation.  Dr. Daleen Squibb in  to see the patient on day of discharge.  The patient without  further  episodes of vertigo.  Blood pressure stable at 110/49, heart rate  ranging from 51-71, sinus bradycardia, normal sinus rhythm.  Dr. Daleen Squibb  felt vertigo was most likely secondary to middle ear.  Bradycardia,  asymptomatic.  The patient on no AV nodal blocking agents.  The patient  being discharged home with instructions to use meclizine p.r.n.  Will  arrange for a 24-hour Holter monitor for further evaluation of  bradycardia.   MEDICATIONS AT DISCHARGE:  As previously prescribed including:  1. Micardis 40 mg daily.  2  Coumadin per Coumadin clinic.  1. Aspirin 81.  2. Furosemide 80 mg.  3. Vytorin 10/40.  4. Spironolactone 25 daily.  5. Pepcid 20 mg daily.  6. Loratadine 10 mg daily.  7. Meclizine 12.5 mg to 25 mg as needed.  Prescription has been given      to the patient.   The patient is scheduled to follow up with Dr. Daleen Squibb on October 10 at  3:30 p.m.  I have arranged for that appointment.  The patient has a  Coumadin clinic appointment September 24 at 9:30  a.m.  I have faxed the  patient's hospital PT/INR to the Coumadin clinic.  I have also arranged  for the patient to have a 24-hour Holter monitor.  Marcos Eke from our  clinic will call the patient at home to arrange placement of this  device.   Duration of discharge encounter greater than 30 minutes.      Dorian Pod, ACNP      Jesse Sans. Daleen Squibb, MD, [facility]  Electronically Signed    MB/MEDQ  D:  05/18/2007  T:  05/18/2007  Job:  [id number]   cc:   Clyda Greener, MD
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
## 2011-01-11 NOTE — Consult Note (Signed)
NAMEVERNICE, BOWKER NO.:  [id number]   MEDICAL RECORD NO.:  [id number]          PATIENT TYPE:  INP   LOCATION:  3713                         FACILITY:  [facility]   PHYSICIAN:  Duke Salvia, MD, FACCDATE OF BIRTH:  [birth year]   DATE OF CONSULTATION:  10/19/2007  DATE OF DISCHARGE:                                 CONSULTATION   REASON FOR CONSULTATION:  I was asked to see Everrett Coombe in  consultation because of recurrent spells.   HISTORY OF PRESENT ILLNESS:  Mr. Cartwright was admitted from Dr. Vern Claude  office on October 17, 2007, despite the fact that the date of admission  and date of discharge of that note are off by 6 months.  He was in the  office for routine followup when he suddenly began to complain of seeing  stars and feeling dizzy.  He had preferential nystagmus to the left.  There was ongoing conversation.  His symptoms abated.   He has had recurrent spells like this for which he has undergone  evaluation with nothing being clearly identified as the cause.  He has  been found to have significant bradycardia with heart rates in the 40s.  With the aforementioned episode, he was apparently quite hypotensive,  although I do not have that access to the specific numbers.   He also has a history of orthostatic intolerance generating symptoms  that are similar to the spells that he described prompting his  admission.   He has a history of a cardiomyopathy that has been felt to be ischemic  and nonischemic with PCI of his RCA in 2004, and his ejection fraction  having been in the 20s and 30s apparently and has now normalized.  Part  of this was attributed to alcohol from which he has abstained since  2002.   He has a history of paroxysmal atrial fibrillation and is status post  right brain stroke.  He has been maintained on Coumadin.   His other thromboembolic risk factors include hypertension.   His cardiac evaluation of late has included Holter monitor  in December  2008, with an average rate of 61, no significant pauses were noted.  An  echocardiogram during this hospitalization confirmed normal left  ventricular function.   Following hospitalization, he has been evaluated by neurology with a  nondiagnostic EEG with slowing which is consistent with his prior stroke  and the MR that demonstrates the above as well.   PAST MEDICAL HISTORY:  As above as well as dyslipidemia.   PAST SURGICAL HISTORY:  I do not have his past surgical history.   REVIEW OF SYSTEMS:  Notable for history of LV thrombus and more remotely  GE reflux disease.   SOCIAL HISTORY:  The patient is divorced.  He has no children.  He is a  retired [employer] consultant.  He does not use alcohol or recreational drugs or  cigarettes.   FAMILY HISTORY:  Noncontributory.   PHYSICAL EXAMINATION:  GENERAL:  He is an elderly, African American  gentleman appearing his stated age of 61.  VITAL SIGNS:  Blood pressure  is ranging in the 115/60 range with a pulse  of 40-60 heart rate into the 30s at 3 a.m., presumably while sleeping,  respirations 16, weight was 86 kg.  HEENT:  Demonstrated no icterus or xanthomata.  NECK:  His neck veins were flat.  Carotids are brisk and full  bilaterally without bruits.  BACK:  Without kyphosis or scoliosis.  LUNGS:  Clear.  HEART:  Regular without murmurs or gallops.  ABDOMEN:  Soft with active bowel sounds without midline pulsation or  hepatomegaly.  EXTREMITIES:  Femoral pulses were 2+.  Distal pulses were intact.  There  is no clubbing, cyanosis or edema.  NEUROLOGIC:  Grossly normal.  SKIN:  Warm and dry.   LABORATORY DATA AND X-RAY FINDINGS:  Electrocardiogram dated February  18, demonstrated sinus rhythm at 55 with intervals at 0.17/0.12/0.39.  Electrocardiogram was notable for inferolateral T-wave inversions.  Telemetry is as noted previously.   Other laboratories are essentially normal apart from an elevated INR  that was 3.6 on  admission is now down to 2.5.  Thyroid functions are  normal as well.  His blood sugar was mildly elevated.   IMPRESSION:  1. Recurrent spells, question presyncopal versus partial seizures.  2. Bradycardia, not clearly related to #1.  3. Healed ischemic and nonischemic cardiomyopathy.      a.     Prior history of alcohol use.      b.     Ejection fraction went from 30% to normal.      c.     Right coronary artery percutaneous coronary intervention.  4. Paroxysmal atrial fibrillation.  5. Thromboembolic risk factors.      a.     Hypertension.      b.     Prior stroke.  6. Orthostatic intolerance creating symptoms similar to #1.   DISCUSSION:  Mr. Cutler has undergone a negative neurological workup for  these spells, although it was Dr. Vern Claude initial impression and  neurology's ongoing impression, that these episodes are consistent with  a primary seizure disorder.  The fact that these symptoms are similar to  his orthostatic symptoms, suggest that they may, in fact, be a cerebral  hypoperfusion manifestation and as a consequence, the bradycardia  becomes of greater concern notwithstanding the previous Holter monitors,  etc.   Two strategies suggest themselves to me.  The first is to proceed with  pacemaker implantation with the likelihood that there may be some  benefit.  The alternative would be to use an implantable loop recorder.  This offers an opportunity to be more certain, although it depends upon  the next spell.  The fact that the neurologist remain concerned that  this is a seizure issue would also prompt me to pursue a diagnostic as  opposed to a therapeutic approach at this juncture.   I reviewed the above with Dr. Daleen Squibb as well who concurs.  We will plan to  proceed with loop recorder implantation later today if the schedule  permits or as an outpatient next week.   I have reviewed the potential benefits as well as potential risks  specifically related to infection.   The patient understands these things  and would like to proceed.      Duke Salvia, MD, [facility]  Electronically Signed     SCK/MEDQ  D:  10/19/2007  T:  10/20/2007  Job:  [id number]   cc:   [facility]
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
## 2011-01-11 NOTE — Procedures (Signed)
EEG NUMBER:  EEG number [id number].   ORDERING PHYSICIAN02:  Dr. Daleen Squibb and also Dr. Nash Shearer.   HISTORY OF PRESENT ILLNESS:  This is a 75 year old man who was admitted  on the 18th for seizures.  He has a history of stroke, CHF and  hypertension.  He saw flashing lights in his vision followed by  lightheadedness while he was in the doctor's office and had no loss of  consciousness.   MEDICATIONS:  Medications include Avapro, Lasix, Aldactone, Coumadin and  Claritin.   PROCEDURE:  This was a routine 17 channel EEG with 1 channel devoted to  the EKG utilizing the  International 10/20 lead placement system.  The patient described  clinically as being awake and asleep.  Electrographically he appeared to  be awake, drowsy and asleep.  While awake the background consisted of a  fairly well-modulated, well-organized, well-developed 10 Hz alpha  activity which is predominant in the posterior head regions on the left  and also to eye opening.  There was some mild asymmetry between  hemispheres with a somewhat suppressed background on the right compared  to the left.  We did not see well-organized, well-developed alpha.  It  looked more like some delta activity.  During drowsiness there was more  of what looked like theta activity.  During drowsiness there was more  diffuse attenuation in the background with decrease in frequency and  amplitude into the theta range.  While asleep there was the presence of  central sharp activity beta sleep spindles and K complexes.  During  sleep and drowsiness no clear interhemispheric asymmetry was identified.  No definite epileptiform discharges are seen although on 1 occasion  there were some small sharp waves coming from the left frontotemporal  region but this was never repeated or sustained and isolation is not  felt to have significance.  Activation procedures included  hyperventilation and photic stimulation.  Neither of them produced any  significant  change in the background activity.  EKG monitor reveals a  bradycardia, however, with a rate in the 40s, from 42 to 48.   ASSESSMENT/IMPRESSION:  Mildly abnormal electroencephalogram  demonstrating subtle right-sided slowing.  This may simply be some  decreased amplitude in the right hemisphere.  However, I cannot rule out  possibility of a structural abnormality in the right hemisphere causing  mild slowing.  No definite epileptiform activity was seen during the  course of today's recording however.  Clinical correlation is  recommended.      Catherine A. Orlin Hilding, M.D.  Electronically Signed     ZOX:WRUE  D:  10/18/2007 17:44:03  T:  10/19/2007 16:52:17  Job #:  [id number]

## 2011-01-11 NOTE — Op Note (Signed)
NAMEKHAREE, LESESNE NO.:  [id number]   MEDICAL RECORD NO.:  [id number]          PATIENT TYPE:  INP   LOCATION:  3713                         FACILITY:  [facility]   PHYSICIAN:  Duke Salvia, MD, FACCDATE OF BIRTH:  1936-02-11   DATE OF PROCEDURE:  10/19/2007  DATE OF DISCHARGE:  10/19/2007                               OPERATIVE REPORT   PREOPERATIVE DIAGNOSIS:  Spells   POSTOPERATIVE DIAGNOSIS:  Spells.   PROCEDURES:  Loop recorder insertion.   PROCEDURE IN DETAIL:  Following obtaining informed consent, the patient  was brought to the electrophysiology laboratory and placed on the  fluoroscopic table in supine position.  After routine prep and drape and  the external mapping, incision was made caudal to the clavicle and  lateral to the sternum where atrial and ventricular electrogram signals  were identified.  The incision was carried down all the way to the  prepectoral fascia and the subcutaneous prepectoral pocket was formed.  A [employer] confirm [id number] device, serial number [id number] was implanted  and secured with two 2-0 silk sutures.  The pocket was copiously  irrigated with antibiotic containing saline solution.  Hemostasis was  reassured.  The wound was closed in three layers in normal fashion.  The  wound was washed, dried and a benzoin Steri-Strip dressing was applied.  Needle counts, sponge counts, and instrument counts were correct at the  end of the procedure according to staff.  The patient tolerated the  procedure without apparent complication.      Duke Salvia, MD, [facility]  Electronically Signed     SCK/MEDQ  D:  01/16/2008  T:  01/17/2008  Job:  [id number]
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
## 2011-01-12 ENCOUNTER — Ambulatory Visit (INDEPENDENT_AMBULATORY_CARE_PROVIDER_SITE_OTHER): Payer: Medicare Other | Admitting: *Deleted

## 2011-01-12 DIAGNOSIS — I4891 Unspecified atrial fibrillation: Secondary | ICD-10-CM

## 2011-01-12 DIAGNOSIS — Z7901 Long term (current) use of anticoagulants: Secondary | ICD-10-CM

## 2011-01-12 DIAGNOSIS — I6350 Cerebral infarction due to unspecified occlusion or stenosis of unspecified cerebral artery: Secondary | ICD-10-CM

## 2011-01-12 LAB — POCT INR: INR: 3.4
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
## 2011-01-14 NOTE — H&P (Signed)
NAME:  Vincent [name], Vincent [name]                          ACCOUNT NO.:  [id number]   MEDICAL RECORD NO.:  [id number]                   PATIENT TYPE:  INP   LOCATION:  2902                                 FACILITY:  [facility]   PHYSICIAN:  Rollene Rotunda, M.D.                DATE OF BIRTH:  1936/02/07   DATE OF ADMISSION:  12/28/2002  DATE OF DISCHARGE:                                HISTORY & PHYSICAL   PRIMARY CARE PHYSICIAN:  [name]. [name], M.D.   REASON FOR ADMISSION:  Evaluate a patient with chest pain.   HISTORY OF PRESENT ILLNESS:  The patient is a pleasant 75 year old African  American gentleman with a history of cardiomyopathy. His previous evaluation  in 2003, took place after he had a CVA and was noted to have a  cardiomyopathy.  He had a cardiac thrombus felt to be the source of his  embolic CVA. At that time, he was noted to have an EF of about 20%. A  Cardiolite demonstrated an EF of approximately 13% with probable anterior  and inferior wall infarcts.  The patient was managed medically.  He has had  problems with atrial fibrillation and is currently maintained on amiodarone.  He has also had trouble with bradycardia and normal sinus rhythm  necessitating discontinuation of beta-blockers.   He awoke today with chest pain. This was a burning discomfort.  It was  substernal. He has had similar discomfort that has gone away drinking water;  however, this was more intense. It did not go away when he drank water.  There was no radiation to his neck or to his arms. It was not associated  with nausea, vomiting, or diaphoresis.  He presented to the emergency room,  where he was not found to have acute EKG changes. The pain did go away after  initiation of IV nitrates.  He is currently pain-free.   He says that he has, otherwise, been relatively active. He is able to do  things such as pushing a lawnmower without bringing on any discomfort and he  did this last week.  He seems to have  class I heart failure symptoms without  PND or orthopnea.  He does not notice any palpitations and has had no  presyncope or syncope.   PAST MEDICAL HISTORY:  Cardiomyopathy (etiology unclear), thought to  possibly be alcoholic.  Abnormal Cardiolite as described (there was also  mention of apical ischemia on some reports), left ventricular thrombus,  embolic CVA, paroxysmal atrial fibrillation, hypertension, hyperlipidemia.   PAST SURGICAL HISTORY:  None.   ALLERGIES:  No known drug allergies.   MEDICATIONS:  1. Amiodarone 200 mg daily.  2. Spironolactone 25 mg daily.  3. Pepcid 20 mg b.i.d.  4. Coumadin 4 mg Monday and Friday, otherwise 3 mg.  5. Aspirin 81 mg daily.  6. Mavik 4 mg daily.  7. K-Dur 40 mEq daily.  8.  Lasix 80 mg daily.  9. Clarinex.   SOCIAL HISTORY:  The patient lives in [place] with his sister.  He is a  Clinical research associate.  He does not smoke cigarettes. He currently does not drink alcohol.   FAMILY HISTORY:  Contributory for his father dying of myocardial infarction  at age 47.   REVIEW OF SYSTEMS:  As stated in the HPI, positive for depression  chronically after the CVA, occasional reflux unlike the current symptoms,  negative for all other systems.   PHYSICAL EXAMINATION:  GENERAL:  The patient is in no distress.  VITAL SIGNS:  Blood pressure 180/72, heart rate 60 and regular, respiratory  rate 18, afebrile.  HEENT:  Eyes unremarkable.  Pupils equal, round, and reactive to light.  Fundi not visualized.  NECK:  No jugular venous distention.  Waveform within normal limits.  Carotid upstroke brisk, symmetrical, with no bruits.  No thyromegaly.  LYMPHATICS: No cervical, axillary, or inguinal adenopathy.  LUNGS:  Clear to auscultation bilaterally.  BACK:  No costovertebral angle tenderness.  CHEST:  Unremarkable.  HEART:  Mild RV lift. PMI not displaced or sustained. S1 and S2 within  normal limits.  No S3, no S4, and no murmurs.  ABDOMEN:  Flat, positive bowel  sounds, normal in frequency and pitch. No  bruits.  No rebound, organomegaly, pulsatile mass, hepatomegaly, or  splenomegaly.  SKIN:  No rash, no nodules.  EXTREMITIES:  Pulses 2+ throughout. No edema.  NEUROLOGICAL:  Oriented to person, place, and time.  Cranial nerves II-XII  grossly intact.  Motor grossly intact.   LABORATORY DATA:  EKG:  Sinus bradycardia, axis within normal limits,  intervals within normal limits, T-wave inversions inferiorly and laterally,  unchanged from previous EKGs consistent with possible repolarization  changes.   WBC 3.7, hemoglobin 10.5, platelets 228, sodium 136, potassium 4.6, BUN 21,  creatinine 1.5.  CK 187, MB 1.7, Troponin 0.02, INR 2.2.   ASSESSMENT:  1. Chest pain.  The patient had chest pain that was responsive to nitrates.     He does have an abnormal Cardiolite in the past. He has a cardiomyopathy     which was felt to be nonischemic. However, this has not been definitely     clarified.  Given all of this above, I think the next step should be     cardiac catheterization.  He could have a cor only test given his     previous LV thrombus.  He can have his EF evaluated with an     echocardiogram. We will need to limit the dye with his mild renal     insufficiency.  We will rule out a myocardial infarction.  The Coumadin     will be held and heparin started when his INR is less than 2.  2. Cardiomyopathy.  He is currently in class I. He has had his medicines     titrated and cannot tolerate a beta-blocker because of bradycardia.  3. Cerebrovascular accident/left ventricular thrombus. The patient will     continue on Coumadin after his catheterization.  He will have bridging     heparin.  4.     Atrial fibrillation.  He maintains sinus rhythm with amiodarone and he will      continue on this. Followup of his thyroid status, pulmonary function    tests, and liver enzymes will be per Dr. Daleen Squibb in the office.  5. Renal insufficiency.  Will try to  keep his I&Os slightly even. We will  need to limit his dye load.                                               Rollene Rotunda, M.D.    JH/MEDQ  D:  12/28/2002  T:  12/28/2002  Job:  [id number]

## 2011-01-14 NOTE — Cardiovascular Report (Signed)
NAME:  Vincent [name], Vincent [name]                          ACCOUNT NO.:  [id number]   MEDICAL RECORD NO.:  [id number]                   PATIENT TYPE:  INP   LOCATION:  6531                                 FACILITY:  [facility]   PHYSICIAN:  Charlies Constable, M.D.                  DATE OF BIRTH:  15-Jul-1936   DATE OF PROCEDURE:  01/01/2003  DATE OF DISCHARGE:                              CARDIAC CATHETERIZATION   PROCEDURE:  Cardiac catheterization and percutaneous intervention.   CARDIOLOGIST:  Charlies Constable, M.D.   CLINICAL HISTORY:  The patient is 75 years old and has a known nonischemic  cardiomyopathy.  He also has had paroxysmal atrial fibrillation and has been  on Coumadin therapy.  His ejection fraction in the past has been 15-20% by  echocardiography.  He was admitted with chest pain suggestive of unstable  angina after his INR dropped below 1.6.  We brought him into the  catheterization lab today for angiography.   DESCRIPTION OF PROCEDURE:  The procedure was performed via the right femoral  artery using an arterial sheath and 6 French preformed coronary catheters.  A front wall arterial puncture was performed, and Omnipaque contrast was  used.  A left ventriculogram was not performed in order to save contrast.   After the completion of the diagnostic study, we made a decision to perform  intervention on the mid right coronary artery.  The patient was given  Angiomax bolus and infusion and 300 mg of Plavix.  There was marked  tortuosity in multiple bends prior to the lesion and so we chose to use an  Amplatz AL1 6 [place] guiding catheter with side holes and a Grafix PT wire.  We crossed the lesion with the wire without too much difficulty.  This  straightened the vessel quite a bit.  We were able to pass a 2.5 x 8 mm  Taxus stent down to the lesion and deploy this with one inflation of 16  atmospheres for 40 seconds.  Repeat diagnostic studies were then performed  with the guiding catheter.   The patient tolerated the procedure well and  left the laboratory is satisfactory condition.   RESULTS:  The left main coronary artery:  The left main coronary artery was  free of significant disease.   Left anterior descending:  The left anterior descending artery gave rise to  two diagonal branches and a septal perforator.  These and the LAD proper  were free of significant disease.   Circumflex artery:  The circumflex artery gave rise to two marginal branches  and two posterior lateral branches.  There was 70% narrowing in the distal  circumflex artery before the two posterior lateral branches.   Right coronary artery:  The right coronary is a moderate size vessel that  gave rise to a posterior descending branch and posterior lateral branch.  There was marked tortuosity in the proximal right  coronary artery with three  90 degree bends prior to the lesion and then three more 90 degree bends  after the lesion.   No left ventriculogram was performed.   Following stenting of the lesion in the mid right coronary artery, stenosis  decreased from 90% to 0%.   PRESSURES:  The aortic pressure was 135/74 with a mean of 98.    CONCLUSION:  1. Coronary artery disease with no significant obstruction in the left     anterior descending artery, 70% narrowing in the distal circumflex artery     and 90% narrowing in the mid right coronary artery.  2. Nonischemic cardiomyopathy with ejection fraction of 15-20% by prior     echocardiography.  3. Successful stenting of the lesion in the mid right coronary artery with a     drug-eluting stent with improvement in the percent diameter narrowing     from 90% to 0%.   DISPOSITION:  The patient was returned to the postangioplasty unit for  further observation.                                                Charlies Constable, M.D.    BB/MEDQ  D:  01/01/2003  T:  01/02/2003  Job:  [id number]   cc:   [name], M.D.   Cardiopulmonary Lab   Dr.  Romeo Apple

## 2011-01-14 NOTE — H&P (Signed)
NAMEAGOSTINO, GORIN NO.:  [id number]   MEDICAL RECORD NO.:  [id number]          PATIENT TYPE:  INP   LOCATION:  1829                         FACILITY:  [facility]   PHYSICIAN:  Learta Codding, M.D. LHCDATE OF BIRTH:  07/16/1936   DATE OF ADMISSION:  03/13/2005  DATE OF DISCHARGE:                                HISTORY & PHYSICAL   REFERRING PHYSICIAN:  1.  Dr. [name].  2.  Dr. Ignacia Palma in the Emergency Room.   CARDIOLOGIST:  Dr. Daleen Squibb.   DATE OF BIRTH:  09-May-1936.   REASON FOR ADMISSION:  Substernal chest pain in a patient with known  coronary artery disease and predominant nonischemic cardiomyopathy.   HISTORY OF PRESENT ILLNESS:  The patient is a 75 year old African-American  male with a history of single vessel coronary artery disease status post  cardiac catheterization in 2004 and stent placement to the RCA.  The patient  also has a predominantly nonischemic cardiomyopathy with previous ejection  fractions in the range of 15%.  More recently, on echo of 2004, his ejection  fraction was 40-45%.  The patient is now admitted with several episodes of  chest pain which occurred yesterday.  He states he had several episodes in  succession which typically lasted 5-10 minutes.  He felt a sharp pain across  the precordium, albeit with no radiation into the neck or shoulder or arm.  There was also no associated shortness of breath.  Did report, however, that  this pain felt like his usual angina that antedated his procedure in 2004.  Today, although the patient had no significant chest pain, he felt  extraordinarily weak and lethargic.  He then decided to come to the  emergency room for further evaluation.  In the ER, a 12-lead  electrocardiogram demonstrated large new ST segment and T-wave changes  inferior and lateral leads.  Again this was not associated with shortness of  breath.  He also complained of occasional shooting pains in both the right  leg and the  right arm which started yesterday.  He also reports some pain in  the right ankle.  The last several days he has had several episodes of  chills although he has no specifically documented fevers.  He reported that  he missed 5 doses of Lasix last week but took some yesterday and today he  does not appear to be in heart failure on admission, however.   ALLERGIES:  NO KNOWN DRUG ALLERGIES.   MEDICATIONS:  1.  Pepcid 20 mg p.o. b.i.d.  2.  Loratadine 10 mg 1/2 tablet daily.  3.  Aspirin.  4.  Mavik 4 mg a day.  5.  K-Dur supplement 20 mEq two a day.  6.  Lasix 80 mg daily.  7.  Vytorin 10/40 mg p.o. daily.  8.  Spironolactone 25 mg p.o. daily.  9.  Coumadin 2 mg two tablets daily and three tablets on Sunday.   PAST MEDICAL HISTORY:  1.  History of cardiac catheterization in 2004, see details by Dr. Juanda Chance.      The patient has  coronary artery disease with no significant obstruction      in the LAD, but with a 70% narrowing in the distal circumflex and a 90%      narrowing in the mid right coronary artery.  During this      catheterization, the patient underwent successful stenting of the lesion      in the mid right coronary artery with drug eluting stent.  Was also felt      to have a nonischemic cardiomyopathy with an ejection fraction of 15-20%      in the past and a little more recently 40-55% by echo.  2.  History of clinical congestive heart failure.  3.  History of paroxysmal atrial fibrillation.  4.  History of chest pain.  Seen in the emergency room in September, 2005,      treated for reflux.  5.  History of CVA May, 2005, with residual left sided weakness.  6.  History of LV thrombus on Coumadin.  7.  History of hypertension.  8.  History of dyslipidemia.  9.  History of second degree AV block, type I [place].   SURGICAL HISTORY INCLUDES:  Right heart cardiac catheterization.   SOCIAL HISTORY:  The patient lives in [place] with his sister.  He is a  Designer, jewellery.  He used to smoke but quit 13 months ago.  Has a remote  history of alcohol use but quit in 2003.  Denies any drugs.   FAMILY HISTORY:  Is notable for a mother who died from CVA and father died  at age of 102 from a myocardial infarction.  Also, has 3 sisters with  coronary artery disease.   REVIEW OF SYSTEMS:  Positive for chills yesterday.  HEENT:  No headache or  sore throat.  No visual or hearing losses.  SKIN:  No rashes.  Positive for  chest pain, as outlined above, but no shortness of breath, dyspnea on  exertion, orthopnea or PND.  No frequency or dysuria, no hematuria, no  weakness or numbness.  Positive for arthralgias in the right hand and right  ankle.  Positive for nausea but no reflux symptoms.   PHYSICAL EXAMINATION:  VITAL SIGNS:  Blood pressure 124/65, heart rate 88  bpm, respirations 20, temperature 98.6.  GENERAL:  An elderly African-American male in no apparent distress.  HEENT:  [place]/AT, PERRLA, EOMI.  NECK:  Supple.  JVD at 6 cm, no bruit, no thyromegaly.  HEART:  Regular rate and rhythm, normal S1, S2.  Soft systolic murmur at  left upper sternal border with no pathological murmurs and BMI is mildly  displaced.  There is no S3 or S4.  LUNGS:  Clear breath sounds bilaterally without wheezing.  SKIN:  No rash or lesions.  ABDOMEN:  Soft, nontender, no rebound or guarding.  GU AND RECTAL:  Deferred.  EXTREMITY EXAM:  No cyanosis, clubbing or edema.  NEURO:  Alert, oriented.  Cranial nerves II-XII grossly intact.  Residual  weakness left side.   Chest x-ray pending.  EKG:  Heart rate 58 bpm, rhythm is sinus bradycardia,  has ST segment depression and T-wave changes in the inferolateral leads.   LABS:  White count 16.9, hemoglobin 12.8, hematocrit 38.9, platelet count  207, neutrophils 89% with left shift, INR 2.2, sodium 135, potassium 4.4,  chloride 104, glucose 100, BUN 14, creatinine 1.5.  Point of care markers in the emergency room first set  negative.   PROBLEMS:  1.  Substernal chest pain,  rule out acute coronary syndrome.      1.  Marked electrocardiogram changes.      2.  History of coronary artery disease status post percutaneous coronary          intervention stent to the right coronary artery (multiple bends in          the right coronary artery).  2.  Nonischemic cardiomyopathy.      1.  Previous ejection fraction 10-15%, more recently 40-45%.  3.  History of paroxysmal atrial fibrillation, on anticoagulation.  4.  History of cerebrovascular accident with mild residual left sided      weakness.  5.  History of left ventricular thrombus.  6.  Anticoagulation therapy for numbers 4 and 5.  7.  History of recent chills.      1.  Elevated white count on admission.      2.  Rule out infection verses gout.   PLAN:  1.  The patient will be admitted and ruled out for myocardial infarction.      His chest pain is somewhat typical for angina.  He had a previously      complicated procedure which extended to a very tortuous right coronary      artery.  2.  The patient will be started on IV heparin as soon as INR below 2.0 and      will proceed with cardiac catheterization.  3.  In the interim, workup to rule out infection and blood cultures will be      obtained as well as a UA.  It could be that the patient's elevated white      count is secondary to gout although clinically, on inspection of his      right ankle, I could not identify that the patient truly has gout and      another source of infection may be possible.       GED/MEDQ  D:  03/13/2005  T:  03/13/2005  Job:  [id number]   cc:   Carleene Cooper III, M.D.  [address] ER  [place]  [place] [address]  Fax: [phone]   Jesse Sans. Wall, M.D.

## 2011-01-14 NOTE — Cardiovascular Report (Signed)
NAMEBRIAN, [name]   MEDICAL RECORD NO.:  [id number]          PATIENT TYPE:  INP   LOCATION:  2016                         FACILITY:  [facility]   PHYSICIAN:  Charlies Constable, M.D. 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  He  was hospitalized four years ago and at that time had a drug-eluting stent  placed in the mid-right coronary artery.  He had a nonischemic  cardiomyopathy with an ejection fraction of about 30% and also had  paroxysmal atrial fibrillation and was treated with Coumadin.  He was  readmitted recently with recurrent chest pain but with no shortness of  breath.  His Coumadin was held and he was scheduled for a catheterization  today.  He also had had a fever and an elevated white count, and the  etiology of this is not clear.  We elected to proceed with catheterization  feeling that we would not likely proceed with intervention if we found  critical disease in view of his fever until this was resolved.   PROCEDURE:  The procedure was performed via the right femoral artery using  an arterial sheath and 6 French preformed coronary catheters.  A front wall  arterial puncture was performed and Omnipaque contrast was used.  The  patient tolerated the procedure well and left the operating room in  satisfactory condition.   RESULTS:  The RV pressure was 121/59 with a mean of 83.  The left  ventricular pressure was 121/8.   Left main coronary artery:  The left main coronary artery was free of  significant disease.   Left anterior descending artery:  The left anterior descending artery gave  rise to a large diagonal branch, a small diagonal branch, a septal  perforator and a third diagonal branch.  There was 40% narrowing in the  proximal LAD before the second diagonal  branch and septal perforator.   Circumflex artery:  The circumflex artery was a fairly large vessel that  gave rise to a small and large marginal branch and three posterolateral  branches.  There was 40% narrowing in the distal segment starting after the  first posterolateral branch.   Right coronary artery:  The right coronary artery is a small to moderate-  size vessel that was very tortuous in its proximal and midportion.  There  was 0% stenosis at the stent site in the midportion.  There was 40%  narrowing just proximal to the stent, and there was 30% narrowing in the  midvessel distal to the stent.  The distal right coronary artery filled the  posterior descending and a small posterolateral branch, which were free of  major obstruction.   Left ventriculogram:  The left ventriculogram performed in the RAO  projection shows good wall motion with no areas of hypokinesis.  This was  markedly improved from the previous description.  The estimated ejection  fraction was 55-60%.  CONCLUSION:  1.  Nonobstructive coronary artery disease with 40% narrowing of the      proximal left anterior descending artery, 40% narrowing in the distal      circumflex artery, 0% stenosis at the stent site in the mid-right      coronary artery, and 40% narrowing in the proximal to mid-right coronary      artery.  2.  Normal left ventricular function.   RECOMMENDATIONS:  The patient has only nonobstructive coronary disease, and  his left ventricular function has normalized since 2004.  In retrospect, his  previous cardiomyopathy may have been a rate-related cardiomyopathy related  to his atrial fibrillation, although I do not have documentation of the past  rate.  I will plan to continue medical therapy and continue workup of his  fever.       BB/MEDQ  D:  03/14/2005  T:  03/15/2005  Job:  [id number]   cc:   Dr. Leanor Rubenstein C. Wall, M.D.   Cardiopulmonary Lab
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
## 2011-01-14 NOTE — Discharge Summary (Signed)
NAME:  Vincent [name], Vincent [name]                          ACCOUNT NO.:  [id number]   MEDICAL RECORD NO.:  [id number]                   PATIENT TYPE:  INP   LOCATION:  3040                                 FACILITY:  [facility]   PHYSICIAN:  Casimiro Needle L. Thad Ranger, M.D.           DATE OF BIRTH:  14-Nov-1935   DATE OF ADMISSION:  01/23/2004  DATE OF DISCHARGE:  01/25/2004                                 DISCHARGE SUMMARY   ADMISSION DIAGNOSES:  1. Acute vertigo, suspected brainstem stroke.  2. Hypertension.  3. History of coronary artery disease.  4. History of dyslipidemia.   DISCHARGE DIAGNOSES:  1. Acute vertigo presumably due to small (magnetic resonance imaging     negative) posterior circulation stroke, improved.  2. Significant but asymptomatic bradycardia with transient second degree     atrioventricular block.  3. Suspected glucose intolerance.  4. History of hypertension.  5. History of coronary artery disease.  6. History of dyslipidemia.   CONDITION ON DISCHARGE:  Improved.   DISCHARGE DIET:  Low-salt, low-fat, low-cholesterol.   ACTIVITY:  He is ad lib.   DISCHARGE MEDICATIONS:  1. Pepcid 20 mg b.i.d.  2. Baby aspirin 81 mg once daily.  3. Mavik 2 mg once daily.  4. Clarinex 5 mg once daily.  5. K-Dur 20 mEq b.i.d.  6. Aldactone 25 mg once daily.  7. Lasix 80 mg once daily.  8. Lipitor 40 mg once daily.  9. Coumadin 4 mg every Monday and Friday, 3 mg every other day.  10.      Hold amiodarone due to bradycardia.   STUDIES:  1. CT of the head without contrast Jan 23, 2004 demonstrating an old right     MCA distribution infarct without acute findings.  2. MRI of the brain performed with contrast on Jan 23, 2004 demonstrating an     old right MCA territory infarct without acute infarct, atrophy and small     vessel disease are present.  3. MRA of the intracranial circulation without contrast demonstrating mild     irregularity of the proximal basilar artery and high-grade focal  stenosis     in the A1 segment of the right anterior cerebral artery.  4. Carotid Doppler Jan 23, 2004 unremarkable.  5. Laboratory review:  CBC:  White count 5.1, hemoglobin 11.5, platelets     239,000 with a normal differential. Prothrombin time on admit 19.6     seconds with an INR of 2.1. CMET unremarkable except for an elevated     glucose of 115. Hemoglobin A1c Jan 24, 2004 elevated at 6.5. Fasting     homocysteine level normal at 13.83. Fasting lipid profile normal with HDL     56, LDL 79. TSH normal at 0.6.  6. Telemetry monitoring demonstrating sinus bradycardia initially with type     1 second degree which ultimately resolved, some marked sinus bradycardia     with rates in the  lower 40s was present although heart rates generally     ran in the 50 to 60 range.   HOSPITAL COURSE:  Please see admission H&P by Dr. Delia Heady for full  admission details. Briefly, this is a 75 year old male with the  aforementioned history who presented to the emergency room with sudden onset  of gait ataxia, nausea, and vertigo which had fluctuated over the previous  couple of days. He was admitted to the hospital for concern of an acute  cerebrovascular event. He underwent routine stroke work-up as outlined  above. He remained on his baby aspirin and Coumadin. He noted definite  improvement throughout his hospitalization being able to sit up much more  comfortably on Jan 24, 2004 and by the morning of Jan 25, 2004, was  ambulatory with minimal dizziness. He had no significant weakness,  dysarthria throughout the admission. He had no falls. Telemetry monitoring  did reveal a significant sinus bradycardia with AV block. He was seen by  cardiology. His amiodarone was held and his AV block resolved. He continued  to have significant bradycardia with EKG on Jan 25, 2004 demonstrating sinus  rhythm with a rate of 41 beats per minute. However, he was asymptomatic and  this was felt by the cardiology  consultants to be of no real consequence and  recommended only continuing to hold the amiodarone. He was otherwise noted  to be stable throughout his hospital stay and was deemed stable for  discharge on the morning of Jan 25, 2004.   FOLLOW UP:  He will follow up with his primary physician, Dr. [name], and  his cardiologist, Dr. Daleen Squibb as scheduled. He will follow up with [facility] as needed.                                                Vincent L. Thad Ranger, M.D.    MLR/MEDQ  D:  01/25/2004  T:  01/25/2004  Job:  [id number]   cc:   Clyda Greener, M.D.   [name], M.D.
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
## 2011-01-14 NOTE — Assessment & Plan Note (Signed)
[facility]                              CARDIOLOGY OFFICE NOTE   NAME:Kulaga, ENOS MUHL                       MRN:          [id number]  DATE:03/29/2006                            DOB:          1935/09/12    Mr. Comp returns today for management of the following issues:  1.  Coronary artery disease.  Catheterization one year ago showed no      obstructive anatomy.  2.  History of an alcoholic cardiomyopathy with near total recovery of left      ventricular systolic function, post alcohol abstinence.  3.  Paroxysmal atrial fibrillation.  4.  Thromboembolic cerebrovascular event.  5.  Anticoagulation.  6.  Hypertension.  7.  Hyperlipidemia.   He is doing remarkably well.  He lost about eight pounds, per my advice on  last visit.  He is having no symptoms of angina or ischemic symptoms.  He  has had no symptoms of TIAs or threatened stroke.  He is very compliant with  his meds.  He is walking about 2 miles a day.   His meds are unchanged since last visit except he is now on 81 mg of aspirin  instead of 325.  Please see the medication list.   PHYSICAL EXAMINATION:  VITAL SIGNS:  His blood pressure today is 152/67.  Pulse is 78 and regular.  His weight is 188.  He has not been hypertensive  in quite some time.  NECK:  His carotid upstrokes are equal without bruits.  No JVD.  Thyroid is  not enlarged.  Trachea is midline.  LUNGS:  Clear.  HEART:  Regular rate and rhythm without murmur, rub or gallop.  ABDOMEN:  Soft with no midline bruit.  There are good bowel sounds.  No  hepatomegaly.  EXTREMITIES:  No clubbing, cyanosis or edema.  Pulses are brisk.   Mr. Swiatek is doing well.  He is on an excellent medical program.  I have  asked him to watch his blood pressure.  We will check blood work today,  including a CBC, comprehensive metabolic panel, and lipids.  I will see him  back in six months.                               Thomas C. Daleen Squibb, MD,  [facility]    TCW/MedQ  DD:  03/29/2006  DT:  03/29/2006  Job #:  [id number]

## 2011-01-14 NOTE — Discharge Summary (Signed)
NAME:  Vincent [name], Vincent [name]                          ACCOUNT NO.:  [id number]   MEDICAL RECORD NO.:  [id number]                   PATIENT TYPE:  INP   LOCATION:  6531                                 FACILITY:  [facility]   PHYSICIAN:  Rollene Rotunda, M.D.                DATE OF BIRTH:  1936/06/12   DATE OF ADMISSION:  12/28/2002  DATE OF DISCHARGE:  01/03/2003                           DISCHARGE SUMMARY - REFERRING   PROCEDURES:  1. Cardiac catheterization.  2. Coronary arteriogram.  3. Left ventriculogram.  4. TAXUS II stent x1.   HOSPITAL COURSE:  The patient is a 75 year old male with no known history of  coronary artery disease.  He was awakened at 3 a.m. by substernal chest pain  described as indigestion.  It was epigastric and radiated up into his chest  bilaterally and was described as a 7/10.  He came to the emergency room and  was still complaining of tightness at a 2-3/10.  He drank water but did not  try any other medications.  He was unable to describe the symptoms further,  but he stated that he had had them before, and they went away with drinking  water so he thought they were indigestion.  He was admitted for further  evaluation and treatment.   His enzymes were negative for MI, and he was held while his INR trended  down.  He remained pain-free on therapy and was stable for catheterization  on Jan 01, 2003.  Cardiac catheterization showed a left normal main and the  circumflex with a 70% stenosis.  The LAD system had no significant disease.  The ICA had a 90% mid lesion that was reduced to 0 with TIMI-3 flow once a  TAXUS Express II stent was inserted and inflated.  He tolerated the  procedure well, and the sheath was removed without difficulty.   The patient had been on Coumadin prior to admission for his CVA and LV  thrombus.  It was felt that he needed complete overlap with heparin to  Coumadin.  He was started back on Coumadin, and this was managed by the  pharmacy  team.  Once he stabilized postprocedure from the intervention,  discussions were initiated regarding Lovenox.  The patient felt that on a  short-term basis he would be able to give himself Lovenox injections and  then come to the Coumadin clinic for follow-up.  This was arranged.   The patient has a history of iatrogenic bradycardia secondary to  medications.  He is currently on amiodarone for paroxysmal atrial  fibrillation.  He is maintaining sinus rhythm at the time.  His heart rate  was dropping into the 40s at times, and Dr. Daleen Squibb was to review his  medications and decide if the amiodarone is to be decreased.  He is not on  any other rate-lowering medications.   The patient has a history of renal insufficiency, and  his creatinine on  admission was 1.5.  He was followed closely over the course of his hospital  stay, and at discharge his creatinine was once again 1.5.  His BUN was 15.  It was felt that this is his baseline, and he had suffered no insult from  the catheterization.   The patient was anemic on admission with a hemoglobin of 10.5.  Postprocedure his hemoglobin changed very little and was 10.1 at discharge.  He is to follow up on this with his primary care physician.   The patient also had some hyponatremia postprocedure, but this resolved.  He  was hypokalemic but continued on his home dose of potassium, and this  resolved as well.   The patient was ambulating without chest pain or shortness of breath.  His  vital signs were stable, and his laboratory values were stable as well.  Pending review of his medications by Dr. Daleen Squibb, he is considered stable for  discharge on Jan 03, 2003, with outpatient follow-up arranged.   LABORATORY VALUES:  Sodium 141, potassium 4.5, chloride 104, CO2 29, BUN 15,  creatinine 1.5, glucose 92.  Hemoglobin 10.1, hematocrit 30.5, WBC 5.4,  platelets 238.  INR at discharge 1.5 with a PTT of 140.   Chest x-ray:  Heart size and mediastinal  contours are within normal limits,  with no active disease.   CONDITION ON DISCHARGE:  Stable.   DISCHARGE DIAGNOSES:  1. Unstable anginal pain, status post percutaneous transluminal coronary     angioplasty and stent to the right coronary artery this admission.  2. Residual coronary artery disease with 70% in the circumflex with an     ejection fraction of 55%.  3. Anemia.  4. Hyponatremia.  5. Hypokalemia.  6. History of paroxysmal atrial fibrillation.  7. Chronic anticoagulation.  8. Seasonal allergies.  9. Hypertension.  10.      Bradycardia secondary to medications.  11.      Gastroesophageal reflux disease symptoms.   DISCHARGE INSTRUCTIONS:  His activity level is to be as tolerated.  He is to  stick to a low-fat and -salt diet.  He is to call the office for problems  with the catheterization site.  He has an appointment on Monday, 11:15, at  the Coumadin clinic.  He is to follow up with Dr. [name] as scheduled.  He  has a PA appointment for Dr. Daleen Squibb on Jan 13, 2003, at 11:15, and he needs a  BMET at that office visit.   DISCHARGE MEDICATIONS:  1. Pepcid 20 mg b.i.d.  2. Amiodarone 200 mg, 1/2 tablet daily.  3. Coumadin 3 mg daily except for 4 mg on Monday and Friday.  4. Aspirin 81 mg daily.  5. Mavik 4 mg daily.  6. Clarinex 5 mg daily.  7. K-Dur 20 mEq, 2 tablets daily.  8. Lasix 80 mg daily.  9. Spironolactone 25 mg daily.  10.      Plavix 75 mg daily for three months.  11.     Lovenox 80 mg subcutaneous q.12h. until his INR is greater than 2.  12.      Crestor 20 mg daily.  13.      Nitroglycerin 0.4 mg sublingual p.r.n.     Lavella Hammock, P.A. [facility]                  Rollene Rotunda, M.D.    RG/MEDQ  D:  01/03/2003  T:  01/03/2003  Job:  [id number]   cc:  Dr. Leanor Rubenstein C. Wall, M.D.

## 2011-01-14 NOTE — Discharge Summary (Signed)
NAMEEDITH, LORD NO.:  [id number]   MEDICAL RECORD NO.:  [id number]          PATIENT TYPE:  INP   LOCATION:  2016                         FACILITY:  [facility]   PHYSICIAN:  Maisie Fus C. Wall, M.D.   DATE OF BIRTH:  [birth year]   DATE OF ADMISSION:  03/13/2005  DATE OF DISCHARGE:  03/17/2005                                 DISCHARGE SUMMARY   PROCEDURES:  1.  Cardiac catheterization.  2.  Coronary arteriogram.  3.  Left ventriculogram.   DISCHARGE DIAGNOSES:  1.  Chest pain, status post cardiac catheterization this admission with      nonobstructive coronary artery disease and no in-stent restenosis;      ejection fraction 55-60% medical, medical therapy recommended.  2.  Urinary tract infection, Cipro for five days.  3.  Remote history of nonischemic cardiomyopathy with an ejection fraction      of 10-15%, ejection fraction 55-60% at catheterization this admission.  4.  A history of congestive heart failure.  5.  History of paroxysmal atrial fibrillation.  6.  Status post stent to the right coronary artery in May 2004.  7.  History of an emergency room visit in September 2005 for chest pain,      proton pump inhibitor initiated.  8.  Status post cerebrovascular accident in 2005 with residual left-sided      weakness.  9.  History of left ventricular thrombus.  10. Hypertension.  11. Hyperlipidemia.  12. History of second degree Mobitz type 1 arteriovenous block.  13. Family history of coronary artery disease in his father died of an      myocardial infarction at age 88.  10. No known drug allergies.   HOSPITAL COURSE:  Mr. Coberly is a 75 year old male with a history of  coronary artery disease. He had chills the day prior to admission and then  developed substernal chest pain at a 5 or 6 out of 10. It started at rest.  He felt it was his usual angina and also complained of some lethargy. He  came to the emergency room and was admitted for further evaluation  and  treatment.   There was concern for an infectious cause of his illness. His white count on  admission was 16.9 thousand. A urinalysis and urine culture were ordered.  The urine culture came back as greater than 100,000 colonies of E coli. that  was sensitive to Cipro so he was started on Cipro. Initial dosage for 3 days  was 500 milligrams q.12h.   His cardiac enzymes were negative for MI and as part of his evaluation a  lipid profile was performed. Lipid profile showed a total cholesterol 134,  triglycerides 95, HDL 56, LDL 19. He is on Vytorin and is to continue this.  His further evaluation and cardiac catheterization was performed on March 14, 2005.   The cardiac catheterization showed no in-stent restenosis in the RCA. He had  between 30-40% lesions in the LAD, circumflex and RCA. His EF which  previously had been low at 10-15% was 55-60%. Dr. Juanda Chance evaluated him and  felt that his LV had recovered and he had nonobstructive coronary artery  disease.   Mr. Ozga symptoms improved with treatment of his UTI. Blood cultures  were also drawn and these are negative so far but have not yet been  completed. Again preliminary reports were negative. His INR on admission was  therapeutic at 2.2 but his Coumadin was held for cath. At discharge, his INR  is 1.4 and he is to continue his home dose Coumadin and follow up with the  Coumadin Clinic. Of note, hemoglobin was low post cath of 10.4 with a  hematocrit of 30.8. He is asymptomatic with this and is to follow up in the  office. He also had some mild elevation in his blood sugars at 127.  Hemoglobin A1c was not performed. He can follow up with Dr. [name] on this.  TSH was within normal limits. Chest x-ray showed no acute abnormalities and  no active disease.   On March 17, 2005, Mr. Grayer was ambulating without chest pain or shortness  of breath. His lethargy had improved. He had a fever of 101.2, as recently  as March 15, 2005  but had been afebrile for 24 hours. Mr. Gilkison was  considered improved and stable for discharge on March 17, 2005 with  outpatient follow-up arranged.   DISCHARGE INSTRUCTIONS:  His activity level and wound care are to be per the  activity sheet. He is to stick to a heart healthy diet. He has appointment  at the Coumadin Clinic on Tuesday, March 22, 2005 at 9:30 and is to follow up  with Dr. Daleen Squibb on March 30, 2005 at 4:15.   DISCHARGE MEDICATIONS:  1.  Lodine 20 milligrams b.i.d.  2.  Loratadine 10 milligrams 1/2 tablet q.d.  3.  Aspirin 325 milligrams q.d.  4.  Trandolapril Mavik q.d.  5.  K-Dur 20 mEq b.i.d.  6.  Lasix 80 milligrams q.d.  7.  Spironolactone 25 milligrams q.d.  8.  Coumadin 2 milligrams 2 tablets q.d. except for 3 tablets on Sunday.  9.  Vytorin 10/40 q.d.  10. Cipro 250 milligrams b.i.d. for two more days.   His Coumadin Clinic visit on Tuesday March 22, 2005. He can be decreased to  aspirin 81 milligrams q.d.       RB/MEDQ  D:  03/17/2005  T:  03/17/2005  Job:  [id number]   cc:   [name], M.D.   [name], M.D.

## 2011-01-14 NOTE — H&P (Signed)
. [facility]  Patient:    Vincent [name], Vincent [name] Visit Number: [id number] MRN: [id number]          Service Type: EMS Location: [place] Attending Physician:  Devoria Albe Dictated by:   Noralyn Pick. Eden Emms, M.D. [facility] Admit Date:  02/12/2002                           History and Physical  HISTORY OF PRESENT ILLNESS:  The patient is a 75 year old patient of Dr. Daleen Squibb.  He came to the emergency room feeling weak, with decreased energy.  He felt the exact same way when he was previously in atrial fibrillation.  The ER doctor did an EKG and found him to back in atrial fibrillation.  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  His EF by echocardiogram this year was 15-20%.  He has had a previous embolic CVA, with clot in his left ventricle in February. He was admitted in March 2003 for atrial fibrillation; cardioversion was not performed due to left atrial appendage thrombus on TEE (performed by Dr. Andee Lineman).  The patient apparently spontaneously converted to sinus rhythm at that time.  He has been seen earlier in the month of May by Dr. Daleen Squibb, and then again later in the month by Dr. Antoine Poche at the heart failure clinic. The patient has not had any increase in his dyspnea.  He does occasionally feel presyncopal, but there is no history of nonsustained VT or VT that I can see.  The patient has had his medications recently adjusted.  His Toprol was increased from 50 to 100 mg, one or two weeks ago.    His medications now include: 1. K-Dur 40 mEq. 2. ________ 125 q.d. 3. Mavic 2 mg q.d. 4. Toprol XL 100 mg q.d. 5. Aspirin q.d. 6. Lasix 80 MG two tablets q.d. 7. Pepcid 20 mg q.d. 8. Spironolactone 25 mg q.d. 9. Coumadin 6 m Mon, Wed, Friday.  ( 4 mg the rest the days).  He does follow up in the Coumadin clinic.  PHYSICAL EXAMINATION:  GENERAL:  He does not appear in any distress.  He has an  atrial fibrillation at the rate of 77.  LUNGS:  His lungs are clear.  NECK:  Carotids are normal.  JVP is mildly elevated.  There is an S3 and S2, with necessary gallop and an MRI murmur.  I administered 9 milkshakes, then checked pulse; no edema.  His electrocautery has no acute changes on it.  He is in atrial fibrillation, with occasional PVCs and nonspecific ST-T-wave changes in digits.  His Q-T Interval :  350. LABS ARE PENDING AT this time.  IMPRESSION:  Recurrent atrial fibrillation in the setting of cardiomyopathy. Clearly he would do better in regards to his poor cardiac output.  If he was in sinus rhythm, there is a note in the chart from March 2003 from Dr. Graciela Husbands, indicating that amiodarone may be initiated in the future.  I think it is reasonable to start.  We will have to cut back on his Toprol and digoxin to make sure he does not become too bradycardic.  The patient will also have to have his Coumadin checked closely, since amiodarone will increase his sensitivity to this.  We will start him on the amiodarone tonight, and further recommendations will be based on his response to this.  With his history of previous  stroke, both LV and left atrial appendage thrombus, then do not think that he would be a candidate for a cardioversions, once he has had a few doses of amiodarone ad if he is not in heart failure and his rates are not low, I think he could be discharged for outpatient follow-up.]  Then a possible repeat cardioversion used as an  outpatient. Dictated by:   Noralyn Pick Eden Emms, M.D. [facility] Attending Physician:  Devoria Albe DD:  02/12/02 TD:  02/12/02 Job: [id number]
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
## 2011-01-14 NOTE — Assessment & Plan Note (Signed)
[facility]                             PULMONARY OFFICE NOTE   NAME:Vincent [name], Vincent [name]                       MRN:          [id number]  DATE:12/20/2006                            DOB:          04-Dec-1935    HISTORY:  This is a delightful 75 year old black male who states that he  is much better since his previous visit when he was seen for an abnormal  CT scan, which was done in turn for the evaluation of cough.  The cough  is totally resolved off of ACE inhibitor and Mavik 40 mg daily.  He  denies any pleuritic or exertional chest pain, dyspnea, cough, fevers,  chills, sweats, dyspnea, leg swelling, or unintended weight loss.   PHYSICAL EXAMINATION:  He is a robust, pleasant, ambulatory black male  in no acute distress.  VITAL SIGNS:  Stable vital signs.  HEENT:  He is still somewhat hoarse, but says this is nothing new for  him for years.  Unremarkable.  Oropharynx is clear.  LUNGS:  Fields are completely clear bilaterally to auscultation and  percussion.  CARDIAC:  Regular rate and rhythm without murmur, gallop, or rub.  ABDOMEN:  Soft and benign.  EXTREMITIES:  Warm without calf tenderness, cyanosis, clubbing, or  edema.   CHEST X-RAY:  No convincing evidence of evolving mass.   IMPRESSION:  1. Cough is totally resolved off of angiotensin-converting enzyme      inhibitor.  I have recommended that he maintain off of angiotensin-      converting enzyme inhibitor and on Micardis 40 mg daily (I gave him      80s to cut in half and save money).  2. Abnormal chest CT scan.  This does not correlate with anything on      chest x-ray, and of course the CT scan was done for evaluation of      cough, which has now resolved.  He is, however, a remote smoker and      so we are not definitely off the hook in this regard.  I would      like to see him back in 3 months to follow up the nodule, sooner if      he develops any pulmonary symptoms.     Charlaine Dalton. Sherene Sires, MD, [facility]  Electronically Signed    MBW/MedQ  DD: 12/20/2006  DT: 12/20/2006  Job #: [id number]

## 2011-01-14 NOTE — H&P (Signed)
NAME:  Vincent [name], Vincent [name]                          ACCOUNT NO.:  [id number]   MEDICAL RECORD NO.:  [id number]                   PATIENT TYPE:  INP   LOCATION:  3040                                 FACILITY:  [facility]   PHYSICIAN:  Learta Codding, M.D. [facility]             DATE OF BIRTH:  1936-01-20   DATE OF ADMISSION:  01/23/2004  DATE OF DISCHARGE:                                HISTORY & PHYSICAL   CARDIOLOGIST:  Maisie Fus C. Wall, M.D.   NEUROLOGIST:  [name]. Love, M.D.   PRESENTING CIRCUMSTANCE:  I've been feeling dizzy for the past two days.   HISTORY OF PRESENT ILLNESS:  Vincent [name] is a 75 year old male.  He is not  having current chest pain or dyspnea.  He does have a history of coronary  artery disease with ischemic cardiomyopathy.  His last ejection fraction was  measured at 40 to 45% by echocardiogram and Cardiolite in September 2004.  He has been stented in the right coronary artery in May 2004, and has  residual disease in the left circumflex of 70%.  He has a history of a right  temporal parietal infarct in January 2003, with the finding at that time of  a left ventricular thrombus.  He has been on chronic Coumadin since that  time.  Over the past two days, the patient has complained of nausea,  dizziness, and gait disturbance.  It is thought on admission that the  patient has new onset posterior circulation CVA.  He underwent MRI today.  The patient was placed on telemetry on admission, and while there rhythm  shows at times a 2:1 nodal block consistent with Mobitz I rhythm.  However,  his perfusing rhythms are sinus bradycardia, and the patient is not  complaining of dyspnea or chest pain.  He seems to be tolerating this well.  Currently, the patient is on Amiodarone low dose for history of paroxysmal  atrial fibrillation.  As mentioned above, he is on Coumadin for finding of  left ventricular thrombus at the time of his CVA in January 2003, and for  continued paroxysmal atrial  fibrillation prophylaxis.   ALLERGIES:  No known drug allergies.   MEDICATIONS:  1. Amiodarone 100 mg daily.  2. Aspirin 81 mg daily.  3. Mavik 2 mg daily.  4. Lipitor 40 mg at bedtime.  5. Lasix 80 mg daily.  6. Aldactone 25 mg daily.  7. Coumadin 3 mg Monday, Wednesday, and Friday, 4 mg Tuesday, Thursday,     Saturday, and Sunday.  His sister mentioned that he had missed a couple     of doses in this past week.  It is unknown whether this is significant.   PAST MEDICAL HISTORY:  1. Coronary artery disease, congestive heart failure, ejection fraction 40     to 45% by 2-D echocardiogram and Cardiolite in September 2004.  2. Status post left heart catheterization in May  2004.  This study shows     left main okay, the LAD and diagonals are free of disease, left     circumflex with a 70% distal stenosis, both obtuse marginals okay, right     coronary artery 90% mid point reduced to 0 status post stenting.  3. Dyslipidemia.  4. Hypertension.  5. History of ethanol use which may have contributed to left ventricular     dysfunction.  6. History of cerebrovascular accident, a right temporal parietal infarct in     January 2003, in the right middle cerebral artery territory with finding     of left ventricular thrombus, placed on Coumadin chronically.  7. Paroxysmal atrial fibrillation.  Low dose Amiodarone.  8. Taken off Plavix in April 2005.  9. Family history of coronary artery disease.   SOCIAL HISTORY:  The patient lives in [place] with one sister.  He has no  children.  He is currently divorced.  He has been a Advertising copywriter, and  currently working since 1964.  He has a remote history of tobacco  habituation, quit 15 years ago.  Alcoholic beverage utilization is  negligible.   FAMILY HISTORY:  Mother died of a cerebrovascular accident.  She was a  double amputee.  The patient is unsure of whether she had diabetes, but he  does state that she does have circulation problems.   His father died at age  56 of a massive myocardial infarction.  He has three sisters, all of none  whom have exhibited coronary artery disease.   REVIEW OF SYSTEMS:  The patient is not having currently fever, chills, night  sweats.  He is not aware of any significant weight change in the last six  weeks.  HEENT:  No progressive hoarseness.  He does have feelings of vertigo  which had onset in the last 48 hours.  He has no history of epistaxis,  photophobia, or hearing loss.  He does have affect of left visual field  secondary to cerebrovascular accident.  INTEGUMENTARY:  No current rashes or  lesions.  CARDIOPULMONARY:  No current history of chest pain, shortness of  breath, dyspnea on exertion, orthopnea, paroxysmal nocturnal dyspnea.  No  current history of edema or palpitations.  He has had dizziness, but mostly  regard to balance problems.  This is not pre-syncopal or syncopal.  He does  not have any history of claudication or chronic coughing.  GENITOURINARY:  The patient mentions nocturia one to two times a night.  NEUROPSYCHIATRIC:  No depression or anxiety.  MUSCULOSKELETAL:  The patient does not complain  of arthralgias or joint swelling or deformity.  GASTROINTESTINAL:  No  chronic history of gastroesophageal reflux.  He does have bright red blood  per rectum secondary to hemorrhoids at times, but he has never had an  internal GI bleed.  The patient does not have heat or cold intolerance and  has never been diagnosed with hypo or hyperthyroidism.  Also, the patient  carries no prior history of diabetes, seizure disorder, deep vein  thrombosis, or pulmonary embolism.   PHYSICAL EXAMINATION:  VITAL SIGNS:  Temperature 97.4, pulse is 43 and  regular, respirations 20, blood pressure 149/70, oxygen saturation 97% on  room air.  GENERAL:  He is alert and oriented x3.  Conversant, very pleasant. HEENT:  Eyes:  Pupils equal, round, reactive to light.  Extraocular  movements were  intact.  He has a left blepharoptoses secondary to CVA.  He  does not wear  dentures.  NECK:  Supple.  No carotid bruits auscultated.  No jugular venous  distention.  No cervical lymphadenopathy.  HEART:  Slow, regular, grade 2/6 systolic ejection murmur.  LUNGS:  Clear to auscultation and percussion bilaterally.  INTEGUMENTARY:  No rashes or lesions.  ABDOMEN:  Soft, nontender, nondistended, no hepatosplenomegaly.  The  abdominal aorta is not pulsatile.  GENITOURINARY:  Deferred.  RECTAL:  Deferred.  EXTREMITIES:  No evidence of cyanosis, clubbing, edema, rashes, or lesions.  He has 4/4 radial pulses bilaterally, 4/4 dorsalis pedis pulses bilaterally.  MUSCULOSKELETAL:  No joint deformity or effusions.  No costovertebral angle  tenderness.  NEUROLOGIC:  Alert and oriented x3.  Cranial nerves II-XII grossly intact.  Grip strength 3/5 on the left, 5/5 on the right.  Slightly clumsy with left  hand on fine motor coordination.   LABORATORY DATA:  Chest x-ray not examined.  Electrocardiogram:  Rate 52,  sinus bradycardia, axis of 0 degrees.  Carotid ultrasound was done on Jan 23, 2004, no internal carotid artery stenosis bilaterally.  Urinalysis  negative.  Complete blood count:  White cells 5.1, hemoglobin 11.5,  hematocrit 34.7, platelets 239.  Serum electrolytes:  Sodium 137, potassium  4.2, chloride 104, bicarbonate 25, BUN 17, creatinine 1.4, glucose 115.  PTT  35, INR 2.1, PT 19.6.  He does have telemetry strips during this  hospitalization which show 2:1 Mobitz I blockage with a perfusing rhythm of  a sinus bradycardia at 44.   This patient was seen concurrently with Dr. Lewayne Bunting.  The rhythm strips  were reviewed showing sinus bradycardia, normal sinus rhythm, with second  degree AV block.  Likely, a Mobitz type I.  This could be possibly vagal  secondary to posterior circulation stroke.  Vital signs are stable, and the  dizziness appears to be neurologic in origin.  We agree  with holding  Amiodarone for now, and may restart it at some later time.  There is no  indication for permanent pacemaker at this point.  We will follow.  In  addition, we will get a TSH study and observe his rhythm.  With narrow QRS,  this is a stable supranodal rhythm.      Maple Mirza, P.A.                    Learta Codding, M.D. [facility]    GM/MEDQ  D:  01/23/2004  T:  01/25/2004  Job:  [id number]

## 2011-01-14 NOTE — Assessment & Plan Note (Signed)
[facility]                             PULMONARY OFFICE NOTE   NAME:Vincent [name], Vincent [name]                       MRN:          [id number]  DATE:11/16/2006                            DOB:          1935-12-20    PULMONARY CONSULTATION   CHIEF COMPLAINT:  Cough.   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 [name]  recently for a congested cough for the last several weeks.  Actually,  the patient's wife has noticed hoarseness off and on for over a year,  but definite worsening of the cough with hoarseness over the last  several weeks.  He has already been started on amoxicillin and describes  the cough as productive of minimum thick brown sputum, but no pleuritic  pain, fevers, chills, sweats, orthopnea, PND, leg swelling, or increased  dyspnea over baseline.   PAST MEDICAL HISTORY:  Significant for hypertension and heart failure,  for which he is on ACE inhibitors.   ALLERGIES:  None known.   MEDICATIONS:  Taken in detail on the worksheet, correct as listed in the  column dated November 16, 2006.   SOCIAL HISTORY:  The patient quit smoking over 20 years ago.  He is a  retired Pensions consultant with no unusual travel, pet, or hobby exposure.   FAMILY HISTORY:  Recorded in detail and significant for the presence of  heart disease in mother and father, negative for respiratory diseases or  lung cancer.   REVIEW OF SYSTEMS:  Taken in detail also on the worksheet and negative  for weight loss or any other significant systemic complaints.   PHYSICAL EXAMINATION:  This is a stoic ambulatory black male in no acute  distress.  VITAL SIGNS:  Stable vital signs.  HEENT:  Unremarkable.  Oropharynx is clear.  He is quite hoarse,  however.  NECK:  Supple without cervical adenopathy or tenderness. Trachea is  midline.  No thyromegaly.  LUNGS:  Fields are clear bilaterally to auscultation and percussion.  CARDIAC:  Regular rate and rhythm without  murmur, gallop, or rub.  ABDOMEN:  Soft and benign.  EXTREMITIES:  Warm without calf tenderness, cyanosis, clubbing, or  edema.   Chest x-ray dated November 10, 2006 was compared to a previous study dated  July 16 and demonstrates several abnormal densities, but especially of  concern a ill-defined nodular density in the left mid lung.  However, on  CT scan dated March 19, he not only has the left mid lung nodule, but  also multiple nonspecific AP nodes and right and left lower lobe nodular  changes.   IMPRESSION:  1. Intermittent hoarseness with acute tracheobronchitis and cough that      is disproportionate of objective findings, all suggestive of an      angiotensin-converting enzyme inhibitor intolerance.      Recommendation was to eliminate Mavik empirically for 6 weeks and      replace it with Micardis 40 one-half daily if his blood pressure      will tolerate it to see to what extent his chronic hoarseness and  recurrent cough can be eliminated.  2. I am not sure the wedge-shaped peripheral densities that are seen      on CT scan have anything to do with his cough, which is more of a      central airway type pattern, but are worrisome and need followup.      Since he has multiple lesions, he is not a candidate for any form      of biopsy at this point, but we will follow him carefully in the      office in 4 weeks with a chest x-ray and perhaps proceed to a PET      scan if the densities persist on plain film.  The best hope is that      these are inflammatory and will resolve back to baseline with      antibiotic therapy that has already been rendered by Dr. [name].     Charlaine Dalton. Sherene Sires, MD, [facility]  Electronically Signed    MBW/MedQ  DD: 11/16/2006  DT: 11/16/2006  Job #: [id number]   cc:   Dr. Clyda Greener

## 2011-01-21 ENCOUNTER — Other Ambulatory Visit: Payer: Self-pay | Admitting: Cardiology

## 2011-01-26 ENCOUNTER — Ambulatory Visit (INDEPENDENT_AMBULATORY_CARE_PROVIDER_SITE_OTHER): Payer: Medicare Other | Admitting: *Deleted

## 2011-01-26 DIAGNOSIS — Z7901 Long term (current) use of anticoagulants: Secondary | ICD-10-CM

## 2011-01-26 DIAGNOSIS — I4891 Unspecified atrial fibrillation: Secondary | ICD-10-CM

## 2011-01-26 DIAGNOSIS — I6350 Cerebral infarction due to unspecified occlusion or stenosis of unspecified cerebral artery: Secondary | ICD-10-CM

## 2011-01-26 LAB — POCT INR: INR: 3

## 2011-02-21 ENCOUNTER — Encounter: Payer: Medicare Other | Admitting: *Deleted

## 2011-03-03 ENCOUNTER — Ambulatory Visit (INDEPENDENT_AMBULATORY_CARE_PROVIDER_SITE_OTHER): Payer: Medicare Other | Admitting: *Deleted

## 2011-03-03 DIAGNOSIS — I4891 Unspecified atrial fibrillation: Secondary | ICD-10-CM

## 2011-03-03 DIAGNOSIS — Z7901 Long term (current) use of anticoagulants: Secondary | ICD-10-CM

## 2011-03-03 DIAGNOSIS — I6350 Cerebral infarction due to unspecified occlusion or stenosis of unspecified cerebral artery: Secondary | ICD-10-CM

## 2011-03-03 LAB — POCT INR: INR: 2.4

## 2011-03-04 ENCOUNTER — Encounter: Payer: Medicare Other | Admitting: *Deleted

## 2011-03-04 ENCOUNTER — Encounter: Payer: Self-pay | Admitting: Cardiology

## 2011-03-07 ENCOUNTER — Encounter: Payer: Self-pay | Admitting: Cardiology

## 2011-03-07 ENCOUNTER — Ambulatory Visit (INDEPENDENT_AMBULATORY_CARE_PROVIDER_SITE_OTHER): Payer: Medicare Other | Admitting: *Deleted

## 2011-03-07 ENCOUNTER — Ambulatory Visit: Payer: Medicare Other | Admitting: Cardiology

## 2011-03-07 ENCOUNTER — Ambulatory Visit (INDEPENDENT_AMBULATORY_CARE_PROVIDER_SITE_OTHER): Payer: Medicare Other | Admitting: Cardiology

## 2011-03-07 VITALS — BP 150/80 | HR 78 | Wt 194.0 lb

## 2011-03-07 DIAGNOSIS — I2510 Atherosclerotic heart disease of native coronary artery without angina pectoris: Secondary | ICD-10-CM

## 2011-03-07 DIAGNOSIS — I10 Essential (primary) hypertension: Secondary | ICD-10-CM

## 2011-03-07 DIAGNOSIS — Z7901 Long term (current) use of anticoagulants: Secondary | ICD-10-CM

## 2011-03-07 DIAGNOSIS — I4891 Unspecified atrial fibrillation: Secondary | ICD-10-CM

## 2011-03-07 DIAGNOSIS — E785 Hyperlipidemia, unspecified: Secondary | ICD-10-CM

## 2011-03-07 DIAGNOSIS — I6350 Cerebral infarction due to unspecified occlusion or stenosis of unspecified cerebral artery: Secondary | ICD-10-CM

## 2011-03-07 DIAGNOSIS — R55 Syncope and collapse: Secondary | ICD-10-CM

## 2011-03-07 MED ORDER — ATORVASTATIN CALCIUM 40 MG PO TABS: 40.0000 mg | ORAL_TABLET | Freq: Every day | ORAL | Status: DC

## 2011-03-07 MED ORDER — IRBESARTAN 300 MG PO TABS: 300.0000 mg | ORAL_TABLET | Freq: Every day | ORAL | Status: DC

## 2011-03-07 NOTE — Assessment & Plan Note (Signed)
Stable. No change in treatment. 

## 2011-03-07 NOTE — Patient Instructions (Signed)
Your physician has recommended you make the following change in your medication:   Stop Simvastatin and Micardis Start: Atorvastatin and Irbesartan ( Avapro)  Your physician recommends that you return for lab work in: August 14,2012 for fasting lab work.  Your physician recommends that you schedule a follow-up appointment in: 6 months with Dr. Daleen Squibb

## 2011-03-07 NOTE — Progress Notes (Signed)
HPI [name] returns today for evaluation and management of his multiple cardiac issues as well as his risk factors of hypertension and hyperlipidemia.  He seems to be doing well. He denies angina or chest pain. He is staying out of the heat. He denies any palpitations, syncope or edema. He has had no bleeding or melena.  He brings in an EKG, chest x-ray report and blood work from Dr. [name]. I reviewed this with him today and all of [place] good except for a slightly elevated blood sugar of 1:15 and LDL 1:15. EKG showed sinus bradycardia with no changes.  [facility] his SGOT is change his [employer] to [employer]. Past Medical History  Diagnosis Date  . Hypertension   . Stroke   . Syncope and collapse   . Hyperlipidemia   . Coronary artery disease   . Atrial fibrillation   . V-tach   . Bradycardia   . Alcoholic cardiomyopathy   . Hemorrhage of rectum and anus   . Cough   . Pulmonary eosinophilia   . Unspecified glaucoma   . GERD (gastroesophageal reflux disease)   . Encounter for long-term (current) use of anticoagulants     Past Surgical History  Procedure Date  . Loop recorder 09/2007  . Knee surgery     Family History  Problem Relation Age of Onset  . Heart attack Neg Hx   . Stroke Neg Hx     History   Social History  . Marital Status: Divorced    Spouse Name: N/A    Number of Children: N/A  . Years of Education: N/A   Occupational History  . Not on file.   Social History Main Topics  . Smoking status: Not on file  . Smokeless tobacco: Not on file  . Alcohol Use: No  . Drug Use: No  . Sexually Active:    Other Topics Concern  . Not on file   Social History Narrative  . No narrative on file    No Known Allergies  Current Outpatient Prescriptions  Medication Sig Dispense Refill  . aspirin 81 MG tablet Take 81 mg by mouth daily.        . bimatoprost (LUMIGAN) 0.03 % ophthalmic solution Place 1 drop into both eyes daily.        . brimonidine (ALPHAGAN P)  0.1 % SOLN Place 1 drop into the right eye 3 (three) times daily.        Marland Kitchen loratadine (CLARITIN) 10 MG tablet Take 10 mg by mouth daily.        . nitroGLYCERIN (NITROLINGUAL) 0.4 MG/SPRAY spray Place 1 spray under the tongue every 5 (five) minutes as needed.        . simvastatin (ZOCOR) 40 MG tablet Take one (1) tablet(s) once daily  30 tablet  11  . spironolactone (ALDACTONE) 25 MG tablet TAKE ONE (1) TABLET(S) ONCE DAILY  30 tablet  11  . warfarin (COUMADIN) 2 MG tablet Take 1 tablet (2 mg total) by mouth as directed.  180 tablet  1  . DISCONTD: telmisartan (MICARDIS) 40 MG tablet Take 40 mg by mouth daily.        Marland Kitchen atorvastatin (LIPITOR) 40 MG tablet Take 1 tablet (40 mg total) by mouth daily.  30 tablet  11  . irbesartan (AVAPRO) 300 MG tablet Take 1 tablet (300 mg total) by mouth daily.  30 tablet  11  . DISCONTD: simvastatin (ZOCOR) 40 MG tablet Take 40 mg by mouth at bedtime.  ROS Negative other than HPI.   PE General Appearance: well developed, well nourished in no acute distress HEENT: symmetrical face, PERRLA, good dentition  Neck: no JVD, thyromegaly, or adenopathy, trachea midline Chest: symmetric without deformity Cardiac: PMI non-displaced, slow regular rate, normal S1, S2, no gallop, soft systolic murmur Lung: clear to ausculation and percussion Vascular: all pulses full without bruits  Abdominal: nondistended, nontender, good bowel sounds, no HSM, no bruits Extremities: no cyanosis, clubbing or edema, no sign of DVT, no varicosities  Skin: normal color, no rashes Neuro: alert and oriented x 3, non-focal Pysch: normal affect Filed Vitals:   03/07/11 1121  BP: 150/80  Pulse: 78  Weight: 194 lb (87.998 kg)    EKG  Labs and Studies Reviewed.   Lab Results  Component Value Date   WBC 4.1 12/16/2010   HGB 11.6* 12/16/2010   HCT 36.9* 12/16/2010   MCV 77.0* 12/16/2010   PLT 242 12/16/2010      Chemistry      Component Value Date/Time   NA 139 12/16/2010  0816   K 4.3 12/16/2010 0816   CL 105 12/16/2010 0816   CO2 29 12/16/2010 0816   BUN 9 12/16/2010 0816   CREATININE 0.87 12/16/2010 0816      Component Value Date/Time   CALCIUM 9.0 12/16/2010 0816   ALKPHOS 72 12/16/2010 0816   AST 16 12/16/2010 0816   ALT 12 12/16/2010 0816   BILITOT 0.8 12/16/2010 0816       Lab Results  Component Value Date   CHOL  Value: 182        ATP III CLASSIFICATION:  <200     mg/dL   Desirable  045-409  mg/dL   Borderline High  >=811    mg/dL   High        05/12/7828   CHOL 199 03/05/2010   CHOL 173 12/02/2009   Lab Results  Component Value Date   HDL 49 12/16/2010   HDL 46.40 03/05/2010   HDL 51.70 12/02/2009   Lab Results  Component Value Date   LDLCALC  Value: 115        Total Cholesterol/HDL:CHD Risk Coronary Heart Disease Risk Table                     Men   Women  1/2 Average Risk   3.4   3.3  Average Risk       5.0   4.4  2 X Average Risk   9.6   7.1  3 X Average Risk  23.4   11.0        Use the calculated Patient Ratio above and the CHD Risk Table to determine the patient's CHD Risk.        ATP III CLASSIFICATION (LDL):  <100     mg/dL   Optimal  562-130  mg/dL   Near or Above                    Optimal  130-159  mg/dL   Borderline  865-784  mg/dL   High  >696     mg/dL   Very High* 2/95/2841   LDLCALC 130* 03/05/2010   LDLCALC 90 12/02/2009   Lab Results  Component Value Date   TRIG 92 12/16/2010   TRIG 113.0 03/05/2010   TRIG 158.0* 12/02/2009   Lab Results  Component Value Date   CHOLHDL 3.7 12/16/2010   CHOLHDL 4 03/05/2010   CHOLHDL 3 12/02/2009  No results found for this basename: HGBA1C   Lab Results  Component Value Date   ALT 12 12/16/2010   AST 16 12/16/2010   ALKPHOS 72 12/16/2010   BILITOT 0.8 12/16/2010   Lab Results  Component Value Date   TSH 1.073 12/16/2010

## 2011-03-07 NOTE — Progress Notes (Signed)
Loop recorder check in clinic  

## 2011-03-07 NOTE — Assessment & Plan Note (Signed)
Will change him to atorvastatin 40 mg each night with followup blood work in 6 weeks. Hopefully can achieve at least an LDL less than 100. I'm reluctant to use 80 mg because of side effects.

## 2011-03-07 NOTE — Assessment & Plan Note (Signed)
Will switch to generic ARB in the form of Irbesarten 300 mg a day. Blood pressure needs to be monitored.

## 2011-03-30 ENCOUNTER — Ambulatory Visit (INDEPENDENT_AMBULATORY_CARE_PROVIDER_SITE_OTHER): Payer: Medicare Other | Admitting: *Deleted

## 2011-03-30 DIAGNOSIS — Z7901 Long term (current) use of anticoagulants: Secondary | ICD-10-CM

## 2011-03-30 DIAGNOSIS — I6350 Cerebral infarction due to unspecified occlusion or stenosis of unspecified cerebral artery: Secondary | ICD-10-CM

## 2011-03-30 DIAGNOSIS — I4891 Unspecified atrial fibrillation: Secondary | ICD-10-CM

## 2011-03-30 LAB — POCT INR: INR: 1.8

## 2011-03-31 ENCOUNTER — Encounter: Payer: Medicare Other | Admitting: *Deleted

## 2011-04-12 ENCOUNTER — Other Ambulatory Visit (INDEPENDENT_AMBULATORY_CARE_PROVIDER_SITE_OTHER): Payer: Medicare Other | Admitting: *Deleted

## 2011-04-12 DIAGNOSIS — E785 Hyperlipidemia, unspecified: Secondary | ICD-10-CM

## 2011-04-12 LAB — LIPID PANEL
Cholesterol: 152 mg/dL (ref 0–200)
HDL: 55.4 mg/dL (ref >39.00)
LDL Cholesterol: 75 mg/dL (ref 0–99)
Total CHOL/HDL Ratio: 3
Triglycerides: 106 mg/dL (ref 0.0–149.0)
VLDL: 21.2 mg/dL (ref 0.0–40.0)

## 2011-04-12 LAB — HEPATIC FUNCTION PANEL
ALT: 17 U/L (ref 0–53)
AST: 16 U/L (ref 0–37)
Albumin: 4.2 g/dL (ref 3.5–5.2)
Alkaline Phosphatase: 73 U/L (ref 39–117)
Bilirubin, Direct: 0 mg/dL (ref 0.0–0.3)
Total Bilirubin: 0.7 mg/dL (ref 0.3–1.2)
Total Protein: 7.6 g/dL (ref 6.0–8.3)

## 2011-04-22 ENCOUNTER — Telehealth: Payer: Self-pay | Admitting: *Deleted

## 2011-04-22 NOTE — Telephone Encounter (Signed)
Pt aware of lab results. Debbie Savon Bordonaro RN  

## 2011-04-27 ENCOUNTER — Ambulatory Visit (INDEPENDENT_AMBULATORY_CARE_PROVIDER_SITE_OTHER): Payer: Medicare Other | Admitting: *Deleted

## 2011-04-27 DIAGNOSIS — I4891 Unspecified atrial fibrillation: Secondary | ICD-10-CM

## 2011-04-27 DIAGNOSIS — Z7901 Long term (current) use of anticoagulants: Secondary | ICD-10-CM

## 2011-04-27 DIAGNOSIS — I6350 Cerebral infarction due to unspecified occlusion or stenosis of unspecified cerebral artery: Secondary | ICD-10-CM

## 2011-04-27 LAB — POCT INR: INR: 2.8

## 2011-05-20 LAB — CARDIAC PANEL(CRET KIN+CKTOT+MB+TROPI)
CK, MB: 2
Relative Index: 0.6
Total CK: 311 — ABNORMAL HIGH
Troponin I: 0.02

## 2011-05-20 LAB — COMPREHENSIVE METABOLIC PANEL
ALT: 9
AST: 23
Albumin: 4.1
Alkaline Phosphatase: 65
BUN: 13
CO2: 28
Calcium: 9.1
Chloride: 99
Creatinine, Ser: 1.18
GFR calc Af Amer: >60
GFR calc non Af Amer: >60
Glucose, Bld: 122 — ABNORMAL HIGH
Potassium: 4.5
Sodium: 133 — ABNORMAL LOW
Total Bilirubin: 1.7 — ABNORMAL HIGH
Total Protein: 7.7

## 2011-05-20 LAB — CK TOTAL AND CKMB (NOT AT ARMC)
CK, MB: 2.2
CK, MB: 2.5
Relative Index: 0.7
Relative Index: 0.7
Total CK: 302 — ABNORMAL HIGH
Total CK: 368 — ABNORMAL HIGH

## 2011-05-20 LAB — TSH: TSH: 0.895

## 2011-05-20 LAB — CBC
HCT: 38.9 — ABNORMAL LOW
Hemoglobin: 12.6 — ABNORMAL LOW
MCHC: 32.4
MCV: 76.4 — ABNORMAL LOW
Platelets: 221
RBC: 5.09
RDW: 17.8 — ABNORMAL HIGH
WBC: 4.5

## 2011-05-20 LAB — TROPONIN I
Troponin I: 0.02
Troponin I: 0.02

## 2011-05-20 LAB — PROTIME-INR
INR: 2.5 — ABNORMAL HIGH
Prothrombin Time: 28.2 — ABNORMAL HIGH

## 2011-05-20 LAB — APTT: aPTT: 42 — ABNORMAL HIGH

## 2011-05-25 ENCOUNTER — Encounter: Payer: Medicare Other | Admitting: *Deleted

## 2011-05-31 ENCOUNTER — Ambulatory Visit (INDEPENDENT_AMBULATORY_CARE_PROVIDER_SITE_OTHER): Payer: Medicare Other | Admitting: *Deleted

## 2011-05-31 DIAGNOSIS — I6350 Cerebral infarction due to unspecified occlusion or stenosis of unspecified cerebral artery: Secondary | ICD-10-CM

## 2011-05-31 DIAGNOSIS — I4891 Unspecified atrial fibrillation: Secondary | ICD-10-CM

## 2011-05-31 DIAGNOSIS — Z7901 Long term (current) use of anticoagulants: Secondary | ICD-10-CM

## 2011-05-31 LAB — POCT INR: INR: 3.3

## 2011-06-01 LAB — BASIC METABOLIC PANEL
BUN: 32 — ABNORMAL HIGH
CO2: 25
Calcium: 8.7
Chloride: 99
Creatinine, Ser: 1.72 — ABNORMAL HIGH
GFR calc Af Amer: 48 — ABNORMAL LOW
GFR calc non Af Amer: 40 — ABNORMAL LOW
Glucose, Bld: 110 — ABNORMAL HIGH
Potassium: 4.8
Sodium: 132 — ABNORMAL LOW

## 2011-06-01 LAB — GLUCOSE, CAPILLARY: Glucose-Capillary: 115 — ABNORMAL HIGH

## 2011-06-01 LAB — PROTIME-INR
INR: 2.4 — ABNORMAL HIGH
INR: 2.6 — ABNORMAL HIGH
Prothrombin Time: 28.2 — ABNORMAL HIGH
Prothrombin Time: 29.5 — ABNORMAL HIGH

## 2011-06-01 LAB — HEMOGLOBIN AND HEMATOCRIT, BLOOD
HCT: 30.3 — ABNORMAL LOW
Hemoglobin: 10.2 — ABNORMAL LOW

## 2011-06-09 LAB — CBC
HCT: 33.6 — ABNORMAL LOW
HCT: 35.5 — ABNORMAL LOW
Hemoglobin: 10.9 — ABNORMAL LOW
Hemoglobin: 11.7 — ABNORMAL LOW
MCHC: 32.6
MCHC: 33
MCV: 76 — ABNORMAL LOW
MCV: 77.2 — ABNORMAL LOW
Platelets: 278
Platelets: 289
RBC: 4.36
RBC: 4.68
RDW: 17.7 — ABNORMAL HIGH
RDW: 17.7 — ABNORMAL HIGH
WBC: 4.8
WBC: 5.5

## 2011-06-09 LAB — TSH: TSH: 0.577

## 2011-06-09 LAB — COMPREHENSIVE METABOLIC PANEL
ALT: 13
AST: 19
Albumin: 4.3
Alkaline Phosphatase: 60
BUN: 13
CO2: 28
Calcium: 9.2
Chloride: 100
Creatinine, Ser: 1.26
GFR calc Af Amer: >60
GFR calc non Af Amer: 57 — ABNORMAL LOW
Glucose, Bld: 116 — ABNORMAL HIGH
Potassium: 3.9
Sodium: 138
Total Bilirubin: 1.2
Total Protein: 7.8

## 2011-06-09 LAB — I-STAT 8, (EC8 V) (CONVERTED LAB)
Acid-Base Excess: 2
BUN: 12
Bicarbonate: 28.4 — ABNORMAL HIGH
Chloride: 102
Glucose, Bld: 112 — ABNORMAL HIGH
HCT: 40
Hemoglobin: 13.6
Operator id: 270111
Potassium: 4
Sodium: 138
TCO2: 30
pCO2, Ven: 47.7
pH, Ven: 7.382 — ABNORMAL HIGH

## 2011-06-09 LAB — BASIC METABOLIC PANEL
BUN: 12
CO2: 28
Calcium: 9.2
Chloride: 101
Creatinine, Ser: 1.23
GFR calc Af Amer: >60
GFR calc non Af Amer: 58 — ABNORMAL LOW
Glucose, Bld: 95
Potassium: 3.9
Sodium: 139

## 2011-06-09 LAB — CARDIAC PANEL(CRET KIN+CKTOT+MB+TROPI)
CK, MB: 2
Relative Index: 0.8
Total CK: 242 — ABNORMAL HIGH
Troponin I: 0.02

## 2011-06-09 LAB — TROPONIN I: Troponin I: 0.02

## 2011-06-09 LAB — APTT
aPTT: 34
aPTT: 45 — ABNORMAL HIGH

## 2011-06-09 LAB — LIPID PANEL
Cholesterol: 177
HDL: 58
LDL Cholesterol: 99
Total CHOL/HDL Ratio: 3.1
Triglycerides: 102
VLDL: 20

## 2011-06-09 LAB — POCT CARDIAC MARKERS
CKMB, poc: 1.8
Myoglobin, poc: 187
Operator id: 270111
Troponin i, poc: <0.05

## 2011-06-09 LAB — CK TOTAL AND CKMB (NOT AT ARMC)
CK, MB: 2.4
Relative Index: 0.9
Total CK: 274 — ABNORMAL HIGH

## 2011-06-09 LAB — PROTIME-INR
INR: 2.6 — ABNORMAL HIGH
INR: 2.6 — ABNORMAL HIGH
Prothrombin Time: 28.3 — ABNORMAL HIGH
Prothrombin Time: 28.5 — ABNORMAL HIGH

## 2011-06-09 LAB — POCT I-STAT CREATININE
Creatinine, Ser: 1.3
Operator id: 270111

## 2011-06-21 ENCOUNTER — Telehealth: Payer: Self-pay | Admitting: Cardiology

## 2011-06-21 NOTE — Telephone Encounter (Signed)
Pharmacy will need to contact pt pcp for this order. Mylo Red RN

## 2011-06-21 NOTE — Telephone Encounter (Signed)
Patient at pharmacy now for flu shot, also wants a  shingle shot. Pharmacy need an order to do this. Fax # [phone]. Pharmacy is aware message will be sent around as urgent message

## 2011-06-28 ENCOUNTER — Other Ambulatory Visit: Payer: Self-pay | Admitting: Cardiology

## 2011-06-28 ENCOUNTER — Ambulatory Visit (INDEPENDENT_AMBULATORY_CARE_PROVIDER_SITE_OTHER): Payer: Medicare Other | Admitting: *Deleted

## 2011-06-28 DIAGNOSIS — I4891 Unspecified atrial fibrillation: Secondary | ICD-10-CM

## 2011-06-28 DIAGNOSIS — I6350 Cerebral infarction due to unspecified occlusion or stenosis of unspecified cerebral artery: Secondary | ICD-10-CM

## 2011-06-28 DIAGNOSIS — Z7901 Long term (current) use of anticoagulants: Secondary | ICD-10-CM

## 2011-06-28 LAB — POCT INR: INR: 2.3

## 2011-07-26 ENCOUNTER — Ambulatory Visit (INDEPENDENT_AMBULATORY_CARE_PROVIDER_SITE_OTHER): Payer: Medicare Other | Admitting: *Deleted

## 2011-07-26 DIAGNOSIS — I6350 Cerebral infarction due to unspecified occlusion or stenosis of unspecified cerebral artery: Secondary | ICD-10-CM

## 2011-07-26 DIAGNOSIS — Z7901 Long term (current) use of anticoagulants: Secondary | ICD-10-CM

## 2011-07-26 DIAGNOSIS — I4891 Unspecified atrial fibrillation: Secondary | ICD-10-CM

## 2011-07-26 LAB — POCT INR: INR: 2.9

## 2011-08-15 ENCOUNTER — Ambulatory Visit (INDEPENDENT_AMBULATORY_CARE_PROVIDER_SITE_OTHER): Payer: Medicare Other | Admitting: *Deleted

## 2011-08-15 DIAGNOSIS — I4891 Unspecified atrial fibrillation: Secondary | ICD-10-CM

## 2011-08-15 DIAGNOSIS — I6350 Cerebral infarction due to unspecified occlusion or stenosis of unspecified cerebral artery: Secondary | ICD-10-CM

## 2011-08-15 DIAGNOSIS — Z7901 Long term (current) use of anticoagulants: Secondary | ICD-10-CM

## 2011-08-15 LAB — POCT INR: INR: 1.7

## 2011-09-01 ENCOUNTER — Ambulatory Visit (INDEPENDENT_AMBULATORY_CARE_PROVIDER_SITE_OTHER): Payer: Medicare Other | Admitting: *Deleted

## 2011-09-01 DIAGNOSIS — I6350 Cerebral infarction due to unspecified occlusion or stenosis of unspecified cerebral artery: Secondary | ICD-10-CM

## 2011-09-01 DIAGNOSIS — I4891 Unspecified atrial fibrillation: Secondary | ICD-10-CM

## 2011-09-01 DIAGNOSIS — Z7901 Long term (current) use of anticoagulants: Secondary | ICD-10-CM

## 2011-09-01 LAB — POCT INR: INR: 2.9

## 2011-09-15 ENCOUNTER — Encounter: Payer: Medicare Other | Admitting: *Deleted

## 2011-09-20 ENCOUNTER — Ambulatory Visit (INDEPENDENT_AMBULATORY_CARE_PROVIDER_SITE_OTHER): Payer: Medicare Other | Admitting: *Deleted

## 2011-09-20 DIAGNOSIS — Z7901 Long term (current) use of anticoagulants: Secondary | ICD-10-CM

## 2011-09-20 DIAGNOSIS — I6350 Cerebral infarction due to unspecified occlusion or stenosis of unspecified cerebral artery: Secondary | ICD-10-CM

## 2011-09-20 DIAGNOSIS — I4891 Unspecified atrial fibrillation: Secondary | ICD-10-CM

## 2011-09-20 LAB — POCT INR: INR: 2.6

## 2011-09-29 ENCOUNTER — Ambulatory Visit (INDEPENDENT_AMBULATORY_CARE_PROVIDER_SITE_OTHER): Payer: Medicare Other | Admitting: Cardiology

## 2011-09-29 ENCOUNTER — Encounter: Payer: Self-pay | Admitting: Cardiology

## 2011-09-29 VITALS — BP 138/80 | HR 50 | Ht 68.0 in | Wt 201.0 lb

## 2011-09-29 DIAGNOSIS — I498 Other specified cardiac arrhythmias: Secondary | ICD-10-CM

## 2011-09-29 DIAGNOSIS — I472 Ventricular tachycardia: Secondary | ICD-10-CM

## 2011-09-29 DIAGNOSIS — Z7901 Long term (current) use of anticoagulants: Secondary | ICD-10-CM

## 2011-09-29 DIAGNOSIS — I10 Essential (primary) hypertension: Secondary | ICD-10-CM

## 2011-09-29 DIAGNOSIS — I2510 Atherosclerotic heart disease of native coronary artery without angina pectoris: Secondary | ICD-10-CM

## 2011-09-29 DIAGNOSIS — I4729 Other ventricular tachycardia: Secondary | ICD-10-CM

## 2011-09-29 DIAGNOSIS — I6350 Cerebral infarction due to unspecified occlusion or stenosis of unspecified cerebral artery: Secondary | ICD-10-CM

## 2011-09-29 DIAGNOSIS — E785 Hyperlipidemia, unspecified: Secondary | ICD-10-CM

## 2011-09-29 DIAGNOSIS — I4891 Unspecified atrial fibrillation: Secondary | ICD-10-CM

## 2011-09-29 NOTE — Assessment & Plan Note (Signed)
Asymptomatic.  Continue to monitor

## 2011-09-29 NOTE — Patient Instructions (Signed)
Your physician wants you to follow-up in:6 months with Dr Wall. You will receive a reminder letter in the mail two months in advance. If you don't receive a letter, please call our office to schedule the follow-up appointment.  Your physician recommends that you continue on your current medications as directed. Please refer to the Current Medication list given to you today.  

## 2011-09-29 NOTE — Assessment & Plan Note (Signed)
No recurrence clinically. In sinus bradycardia and asymptomatic. No change in treatment. Continue anticoagulation.

## 2011-09-29 NOTE — Assessment & Plan Note (Signed)
At goal. We'll recheck this fall

## 2011-09-29 NOTE — Progress Notes (Signed)
HPI Vincent [name] returns the TODAY for evaluation and management of his multiple cardiovascular issues.  He is doing remarkably well without complaint of angina, ischemic chest pain, palpitations, presyncope or syncope, dyspnea on exertion. He's had no bleeding or melena. His protimes have been largely therapeutic. He's very compliant with his program.  He denies orthopnea, PND or change in weight. He has had no significant edema.  Past Medical History  Diagnosis Date  . Hypertension   . Stroke   . Syncope and collapse   . Hyperlipidemia   . Coronary artery disease   . Atrial fibrillation   . V-tach   . Bradycardia   . Alcoholic cardiomyopathy   . Hemorrhage of rectum and anus   . Cough   . Pulmonary eosinophilia   . Unspecified glaucoma   . GERD (gastroesophageal reflux disease)   . Encounter for long-term (current) use of anticoagulants     Current Outpatient Prescriptions  Medication Sig Dispense Refill  . aspirin 81 MG tablet Take 81 mg by mouth daily.        Marland Kitchen atorvastatin (LIPITOR) 40 MG tablet Take 1 tablet (40 mg total) by mouth daily.  30 tablet  11  . bimatoprost (LUMIGAN) 0.03 % ophthalmic solution Place 1 drop into both eyes daily.        . brimonidine (ALPHAGAN P) 0.1 % SOLN Place 1 drop into the right eye 3 (three) times daily.        . colchicine 0.6 MG tablet Take 0.6 mg by mouth as needed.      . irbesartan (AVAPRO) 300 MG tablet Take 1 tablet (300 mg total) by mouth daily.  30 tablet  11  . loratadine (CLARITIN) 10 MG tablet Take 10 mg by mouth daily.        . nitroGLYCERIN (NITROLINGUAL) 0.4 MG/SPRAY spray Place 1 spray under the tongue every 5 (five) minutes as needed.        Marland Kitchen spironolactone (ALDACTONE) 25 MG tablet TAKE ONE (1) TABLET(S) ONCE DAILY  30 tablet  11  . warfarin (COUMADIN) 2 MG tablet Take as directed by anticoagulation clinic  180 tablet  1  . simvastatin (ZOCOR) 40 MG tablet Take one (1) tablet(s) once daily  30 tablet  11    No Known  Allergies  Family History  Problem Relation Age of Onset  . Heart attack Neg Hx   . Stroke Neg Hx     History   Social History  . Marital Status: Divorced    Spouse Name: N/A    Number of Children: N/A  . Years of Education: N/A   Occupational History  . Not on file.   Social History Main Topics  . Smoking status: Never Smoker   . Smokeless tobacco: Not on file  . Alcohol Use: No  . Drug Use: No  . Sexually Active: Not on file   Other Topics Concern  . Not on file   Social History Narrative  . No narrative on file    ROS ALL NEGATIVE EXCEPT THOSE NOTED IN HPI  PE  General Appearance: well developed, well nourished in no acute distress, overweight HEENT: symmetrical face, PERRLA, good dentition  Neck: no JVD, thyromegaly, or adenopathy, trachea midline Chest: symmetric without deformity Cardiac: PMI non-displaced, slow regular rate and rhythm, normal S1, S2, no gallop or murmur Lung: clear to ausculation and percussion Vascular: all pulses full without bruits  Abdominal: nondistended, nontender, good bowel sounds, no HSM, no bruits Extremities: no  cyanosis, clubbing or edema, no sign of DVT, no varicosities  Skin: normal color, no rashes Neuro: alert and oriented x 3, non-focal Pysch: normal affect  EKG Sinus bradycardia ST segment changes inferiorly and anterolaterally, no acute changes. BMET    Component Value Date/Time   NA 139 12/16/2010 0816   K 4.3 12/16/2010 0816   CL 105 12/16/2010 0816   CO2 29 12/16/2010 0816   GLUCOSE 115* 12/16/2010 0816   BUN 9 12/16/2010 0816   CREATININE 0.87 12/16/2010 0816   CALCIUM 9.0 12/16/2010 0816   GFRNONAA >60 12/16/2010 0816   GFRAA  Value: >60        The eGFR has been calculated using the MDRD equation. This calculation has not been validated in all clinical situations. eGFR's persistently <60 mL/min signify possible Chronic Kidney Disease. 12/16/2010 0816    Lipid Panel     Component Value Date/Time   CHOL 152  04/12/2011 0923   TRIG 106.0 04/12/2011 0923   HDL 55.40 04/12/2011 0923   CHOLHDL 3 04/12/2011 0923   VLDL 21.2 04/12/2011 0923   LDLCALC 75 04/12/2011 0923    CBC    Component Value Date/Time   WBC 4.1 12/16/2010 0816   RBC 4.79 12/16/2010 0816   HGB 11.6* 12/16/2010 0816   HCT 36.9* 12/16/2010 0816   PLT 242 12/16/2010 0816   MCV 77.0* 12/16/2010 0816   MCH 24.2* 12/16/2010 0816   MCHC 31.4 12/16/2010 0816   RDW 15.7* 12/16/2010 2536

## 2011-09-29 NOTE — Assessment & Plan Note (Signed)
Stable clinically. Continue aggressive secondary preventive therapy

## 2011-09-29 NOTE — Assessment & Plan Note (Signed)
Well controlled. Continue current medication.  

## 2011-09-29 NOTE — Assessment & Plan Note (Signed)
No clinical recurrence. Continue medical therapy. He has sustained left ventricular function.

## 2011-10-20 ENCOUNTER — Ambulatory Visit (INDEPENDENT_AMBULATORY_CARE_PROVIDER_SITE_OTHER): Payer: Medicare Other | Admitting: *Deleted

## 2011-10-20 DIAGNOSIS — I6350 Cerebral infarction due to unspecified occlusion or stenosis of unspecified cerebral artery: Secondary | ICD-10-CM

## 2011-10-20 DIAGNOSIS — Z7901 Long term (current) use of anticoagulants: Secondary | ICD-10-CM

## 2011-10-20 DIAGNOSIS — I4891 Unspecified atrial fibrillation: Secondary | ICD-10-CM

## 2011-10-20 LAB — POCT INR: INR: 3.6

## 2011-10-27 ENCOUNTER — Other Ambulatory Visit: Payer: Self-pay | Admitting: Cardiology

## 2011-11-14 ENCOUNTER — Ambulatory Visit (INDEPENDENT_AMBULATORY_CARE_PROVIDER_SITE_OTHER): Payer: Medicare Other | Admitting: Pharmacist

## 2011-11-14 DIAGNOSIS — I6350 Cerebral infarction due to unspecified occlusion or stenosis of unspecified cerebral artery: Secondary | ICD-10-CM

## 2011-11-14 DIAGNOSIS — Z7901 Long term (current) use of anticoagulants: Secondary | ICD-10-CM

## 2011-11-14 DIAGNOSIS — I4891 Unspecified atrial fibrillation: Secondary | ICD-10-CM

## 2011-11-14 LAB — POCT INR: INR: 3.8

## 2011-11-23 ENCOUNTER — Encounter: Payer: Self-pay | Admitting: *Deleted

## 2011-11-23 ENCOUNTER — Telehealth: Payer: Self-pay | Admitting: Internal Medicine

## 2011-11-23 NOTE — Telephone Encounter (Signed)
11-23-11 sent pt past due letter, to see klein/mt

## 2011-11-29 ENCOUNTER — Other Ambulatory Visit: Payer: Self-pay | Admitting: Cardiology

## 2011-12-01 ENCOUNTER — Ambulatory Visit (INDEPENDENT_AMBULATORY_CARE_PROVIDER_SITE_OTHER): Payer: Medicare Other | Admitting: *Deleted

## 2011-12-01 DIAGNOSIS — I4891 Unspecified atrial fibrillation: Secondary | ICD-10-CM

## 2011-12-01 DIAGNOSIS — I6350 Cerebral infarction due to unspecified occlusion or stenosis of unspecified cerebral artery: Secondary | ICD-10-CM

## 2011-12-01 DIAGNOSIS — Z7901 Long term (current) use of anticoagulants: Secondary | ICD-10-CM

## 2011-12-01 LAB — POCT INR: INR: 2.2

## 2011-12-08 ENCOUNTER — Encounter: Payer: Self-pay | Admitting: Internal Medicine

## 2011-12-08 ENCOUNTER — Ambulatory Visit (INDEPENDENT_AMBULATORY_CARE_PROVIDER_SITE_OTHER): Payer: Medicare Other | Admitting: *Deleted

## 2011-12-08 DIAGNOSIS — R55 Syncope and collapse: Secondary | ICD-10-CM

## 2011-12-08 LAB — MDC_IDC_ENUM_SESS_TYPE_INCLINIC: Implantable Pulse Generator Serial Number: 2027242

## 2011-12-08 LAB — PACEMAKER DEVICE OBSERVATION: DEVICE MODEL PM: 2027242

## 2011-12-08 NOTE — Progress Notes (Signed)
ILR interrogation 

## 2011-12-29 ENCOUNTER — Ambulatory Visit (INDEPENDENT_AMBULATORY_CARE_PROVIDER_SITE_OTHER): Payer: Medicare Other | Admitting: *Deleted

## 2011-12-29 DIAGNOSIS — I6350 Cerebral infarction due to unspecified occlusion or stenosis of unspecified cerebral artery: Secondary | ICD-10-CM

## 2011-12-29 DIAGNOSIS — Z7901 Long term (current) use of anticoagulants: Secondary | ICD-10-CM

## 2011-12-29 DIAGNOSIS — I4891 Unspecified atrial fibrillation: Secondary | ICD-10-CM

## 2011-12-29 LAB — POCT INR: INR: 2.9

## 2012-01-26 ENCOUNTER — Other Ambulatory Visit: Payer: Self-pay | Admitting: Cardiology

## 2012-01-26 ENCOUNTER — Ambulatory Visit (INDEPENDENT_AMBULATORY_CARE_PROVIDER_SITE_OTHER): Payer: Medicare Other | Admitting: *Deleted

## 2012-01-26 DIAGNOSIS — Z7901 Long term (current) use of anticoagulants: Secondary | ICD-10-CM

## 2012-01-26 DIAGNOSIS — I6350 Cerebral infarction due to unspecified occlusion or stenosis of unspecified cerebral artery: Secondary | ICD-10-CM

## 2012-01-26 DIAGNOSIS — I4891 Unspecified atrial fibrillation: Secondary | ICD-10-CM

## 2012-01-26 LAB — POCT INR: INR: 2

## 2012-02-12 ENCOUNTER — Other Ambulatory Visit: Payer: Self-pay | Admitting: Cardiology

## 2012-02-23 ENCOUNTER — Ambulatory Visit (INDEPENDENT_AMBULATORY_CARE_PROVIDER_SITE_OTHER): Payer: Medicare Other

## 2012-02-23 DIAGNOSIS — Z7901 Long term (current) use of anticoagulants: Secondary | ICD-10-CM

## 2012-02-23 DIAGNOSIS — I6350 Cerebral infarction due to unspecified occlusion or stenosis of unspecified cerebral artery: Secondary | ICD-10-CM

## 2012-02-23 DIAGNOSIS — I4891 Unspecified atrial fibrillation: Secondary | ICD-10-CM

## 2012-02-23 LAB — POCT INR: INR: 3.2

## 2012-03-22 ENCOUNTER — Ambulatory Visit (INDEPENDENT_AMBULATORY_CARE_PROVIDER_SITE_OTHER): Payer: Medicare Other | Admitting: Pharmacist

## 2012-03-22 DIAGNOSIS — I6350 Cerebral infarction due to unspecified occlusion or stenosis of unspecified cerebral artery: Secondary | ICD-10-CM

## 2012-03-22 DIAGNOSIS — I4891 Unspecified atrial fibrillation: Secondary | ICD-10-CM

## 2012-03-22 DIAGNOSIS — Z7901 Long term (current) use of anticoagulants: Secondary | ICD-10-CM

## 2012-03-22 LAB — POCT INR: INR: 2.2

## 2012-04-03 ENCOUNTER — Encounter: Payer: Self-pay | Admitting: Cardiology

## 2012-04-03 ENCOUNTER — Ambulatory Visit (INDEPENDENT_AMBULATORY_CARE_PROVIDER_SITE_OTHER): Payer: Medicare Other | Admitting: Cardiology

## 2012-04-03 ENCOUNTER — Other Ambulatory Visit: Payer: Self-pay | Admitting: Cardiology

## 2012-04-03 VITALS — BP 142/78 | HR 46 | Ht 68.0 in | Wt 198.0 lb

## 2012-04-03 DIAGNOSIS — Z7901 Long term (current) use of anticoagulants: Secondary | ICD-10-CM

## 2012-04-03 DIAGNOSIS — I10 Essential (primary) hypertension: Secondary | ICD-10-CM

## 2012-04-03 DIAGNOSIS — I4729 Other ventricular tachycardia: Secondary | ICD-10-CM

## 2012-04-03 DIAGNOSIS — I426 Alcoholic cardiomyopathy: Secondary | ICD-10-CM

## 2012-04-03 DIAGNOSIS — I2510 Atherosclerotic heart disease of native coronary artery without angina pectoris: Secondary | ICD-10-CM

## 2012-04-03 DIAGNOSIS — I472 Ventricular tachycardia: Secondary | ICD-10-CM

## 2012-04-03 DIAGNOSIS — I4891 Unspecified atrial fibrillation: Secondary | ICD-10-CM

## 2012-04-03 DIAGNOSIS — I498 Other specified cardiac arrhythmias: Secondary | ICD-10-CM

## 2012-04-03 MED ORDER — IRBESARTAN 300 MG PO TABS: 300.0000 mg | ORAL_TABLET | Freq: Every day | ORAL | Status: DC

## 2012-04-03 MED ORDER — ATORVASTATIN CALCIUM 40 MG PO TABS: 40.0000 mg | ORAL_TABLET | Freq: Every day | ORAL | Status: DC

## 2012-04-03 NOTE — Patient Instructions (Addendum)
Your physician recommends that you continue on your current medications as directed. Please refer to the Current Medication list given to you today.  Your physician wants you to follow-up in: 6 months with Dr. Wall. You will receive a reminder letter in the mail two months in advance. If you don't receive a letter, please call our office to schedule the follow-up appointment.  

## 2012-04-03 NOTE — Assessment & Plan Note (Signed)
Stable. Continue secondary preventative therapy. Return the office in 6 months. 

## 2012-04-03 NOTE — Assessment & Plan Note (Signed)
Currently in sinus bradycardia and asymptomatic. His bradycardia has been stable. Continue to monitor.

## 2012-04-03 NOTE — Progress Notes (Signed)
HPI Vincent [name] comes in today for followup of his complex cardiovascular history.  He denies any orthopnea, PND, edema, palpitations, presyncope, syncope or chest pain. He denies any bleeding problems with the Coumadin.    He is compliant with his medications.  Past Medical History  Diagnosis Date  . Hypertension   . Stroke   . Syncope and collapse   . Hyperlipidemia   . Coronary artery disease   . Atrial fibrillation   . V-tach   . Bradycardia   . Alcoholic cardiomyopathy   . Hemorrhage of rectum and anus   . Cough   . Pulmonary eosinophilia   . Unspecified glaucoma   . GERD (gastroesophageal reflux disease)   . Encounter for long-term (current) use of anticoagulants     Current Outpatient Prescriptions  Medication Sig Dispense Refill  . aspirin 81 MG tablet Take 81 mg by mouth daily.        Marland Kitchen atorvastatin (LIPITOR) 40 MG tablet Take 40 mg by mouth daily.      . bimatoprost (LUMIGAN) 0.03 % ophthalmic solution Place 1 drop into both eyes daily.        . brimonidine (ALPHAGAN P) 0.1 % SOLN Place 1 drop into the right eye 3 (three) times daily.        . colchicine 0.6 MG tablet Take 0.6 mg by mouth as needed.      . famotidine (PEPCID) 20 MG tablet TAKE ONE (1) TABLET(S) TWICE DAILY  60 tablet  5  . irbesartan (AVAPRO) 300 MG tablet Take 300 mg by mouth daily.      Marland Kitchen loratadine (CLARITIN) 10 MG tablet Take 10 mg by mouth daily.        . nitroGLYCERIN (NITROLINGUAL) 0.4 MG/SPRAY spray Place 1 spray under the tongue every 5 (five) minutes as needed.        . simvastatin (ZOCOR) 40 MG tablet Take one (1) tablet(s) once daily  30 tablet  11  . spironolactone (ALDACTONE) 25 MG tablet TAKE ONE (1) TABLET(S) ONCE DAILY  30 tablet  11  . warfarin (COUMADIN) 2 MG tablet TAKE AS DIRECTED BY ANTICOAGULATION  180 tablet  1  . DISCONTD: atorvastatin (LIPITOR) 40 MG tablet Take 1 tablet (40 mg total) by mouth daily.  30 tablet  11  . DISCONTD: irbesartan (AVAPRO) 300 MG tablet Take 1  tablet (300 mg total) by mouth daily.  30 tablet  11    No Known Allergies  Family History  Problem Relation Age of Onset  . Heart attack Neg Hx   . Stroke Neg Hx     History   Social History  . Marital Status: Divorced    Spouse Name: N/A    Number of Children: N/A  . Years of Education: N/A   Occupational History  . Not on file.   Social History Main Topics  . Smoking status: Never Smoker   . Smokeless tobacco: Not on file  . Alcohol Use: No  . Drug Use: No  . Sexually Active: Not on file   Other Topics Concern  . Not on file   Social History Narrative  . No narrative on file    ROS ALL NEGATIVE EXCEPT THOSE NOTED IN HPI  PE  General Appearance: well developed, well nourished in no acute distress HEENT: symmetrical face, PERRLA, good dentition  Neck: no JVD, thyromegaly, or adenopathy, trachea midline Chest: symmetric without deformity Cardiac: PMI non-displaced, slow irregular rate, normal S1, S2, no gallop or murmur  Lung: clear to ausculation and percussion Vascular: all pulses full without bruits  Abdominal: nondistended, nontender, good bowel sounds, no HSM, no bruits Extremities: no cyanosis, clubbing or edema, no sign of DVT, no varicosities  Skin: normal color, no rashes Neuro: alert and oriented x 3, non-focal Pysch: normal affect  EKG Sinus bradycardia, rate 46, interventricular conduction delay, ST segment changes inferior laterally, no acute changes     Component Value Date/Time   NA 139 12/16/2010 0816   K 4.3 12/16/2010 0816   CL 105 12/16/2010 0816   CO2 29 12/16/2010 0816   GLUCOSE 115* 12/16/2010 0816   BUN 9 12/16/2010 0816   CREATININE 0.87 12/16/2010 0816   CALCIUM 9.0 12/16/2010 0816   GFRNONAA >60 12/16/2010 0816   GFRAA  Value: >60        The eGFR has been calculated using the MDRD equation. This calculation has not been validated in all clinical situations. eGFR's persistently <60 mL/min signify possible Chronic Kidney Disease.  12/16/2010 0816    Lipid Panel     Component Value Date/Time   CHOL 152 04/12/2011 0923   TRIG 106.0 04/12/2011 0923   HDL 55.40 04/12/2011 0923   CHOLHDL 3 04/12/2011 0923   VLDL 21.2 04/12/2011 0923   LDLCALC 75 04/12/2011 0923    CBC    Component Value Date/Time   WBC 4.1 12/16/2010 0816   RBC 4.79 12/16/2010 0816   HGB 11.6* 12/16/2010 0816   HCT 36.9* 12/16/2010 0816   PLT 242 12/16/2010 0816   MCV 77.0* 12/16/2010 0816   MCH 24.2* 12/16/2010 0816   MCHC 31.4 12/16/2010 0816   RDW 15.7* 12/16/2010 4098

## 2012-04-19 ENCOUNTER — Ambulatory Visit (INDEPENDENT_AMBULATORY_CARE_PROVIDER_SITE_OTHER): Payer: Medicare Other | Admitting: *Deleted

## 2012-04-19 ENCOUNTER — Ambulatory Visit (INDEPENDENT_AMBULATORY_CARE_PROVIDER_SITE_OTHER): Payer: Medicare Other | Admitting: Pharmacist

## 2012-04-19 DIAGNOSIS — I4891 Unspecified atrial fibrillation: Secondary | ICD-10-CM

## 2012-04-19 DIAGNOSIS — R55 Syncope and collapse: Secondary | ICD-10-CM

## 2012-04-19 DIAGNOSIS — Z7901 Long term (current) use of anticoagulants: Secondary | ICD-10-CM

## 2012-04-19 DIAGNOSIS — I6350 Cerebral infarction due to unspecified occlusion or stenosis of unspecified cerebral artery: Secondary | ICD-10-CM

## 2012-04-19 LAB — POCT INR: INR: 2

## 2012-06-01 ENCOUNTER — Ambulatory Visit (INDEPENDENT_AMBULATORY_CARE_PROVIDER_SITE_OTHER): Payer: Medicare Other | Admitting: *Deleted

## 2012-06-01 DIAGNOSIS — Z7901 Long term (current) use of anticoagulants: Secondary | ICD-10-CM

## 2012-06-01 DIAGNOSIS — I6350 Cerebral infarction due to unspecified occlusion or stenosis of unspecified cerebral artery: Secondary | ICD-10-CM

## 2012-06-01 DIAGNOSIS — I4891 Unspecified atrial fibrillation: Secondary | ICD-10-CM

## 2012-06-01 LAB — POCT INR: INR: 2.4

## 2012-07-13 ENCOUNTER — Ambulatory Visit (INDEPENDENT_AMBULATORY_CARE_PROVIDER_SITE_OTHER): Payer: Medicare Other

## 2012-07-13 DIAGNOSIS — I6350 Cerebral infarction due to unspecified occlusion or stenosis of unspecified cerebral artery: Secondary | ICD-10-CM

## 2012-07-13 DIAGNOSIS — I4891 Unspecified atrial fibrillation: Secondary | ICD-10-CM

## 2012-07-13 DIAGNOSIS — Z7901 Long term (current) use of anticoagulants: Secondary | ICD-10-CM

## 2012-07-13 LAB — POCT INR: INR: 1.7

## 2012-08-10 ENCOUNTER — Ambulatory Visit (INDEPENDENT_AMBULATORY_CARE_PROVIDER_SITE_OTHER): Payer: Medicare Other | Admitting: *Deleted

## 2012-08-10 DIAGNOSIS — I6350 Cerebral infarction due to unspecified occlusion or stenosis of unspecified cerebral artery: Secondary | ICD-10-CM

## 2012-08-10 DIAGNOSIS — I4891 Unspecified atrial fibrillation: Secondary | ICD-10-CM

## 2012-08-10 DIAGNOSIS — Z7901 Long term (current) use of anticoagulants: Secondary | ICD-10-CM

## 2012-08-10 LAB — POCT INR: INR: 2.4

## 2012-09-07 ENCOUNTER — Ambulatory Visit (INDEPENDENT_AMBULATORY_CARE_PROVIDER_SITE_OTHER): Payer: Medicare Other | Admitting: *Deleted

## 2012-09-07 DIAGNOSIS — I6350 Cerebral infarction due to unspecified occlusion or stenosis of unspecified cerebral artery: Secondary | ICD-10-CM

## 2012-09-07 DIAGNOSIS — I4891 Unspecified atrial fibrillation: Secondary | ICD-10-CM

## 2012-09-07 DIAGNOSIS — Z7901 Long term (current) use of anticoagulants: Secondary | ICD-10-CM

## 2012-09-07 LAB — POCT INR: INR: 1.9

## 2012-09-11 ENCOUNTER — Other Ambulatory Visit: Payer: Self-pay | Admitting: *Deleted

## 2012-09-11 MED ORDER — WARFARIN SODIUM 2 MG PO TABS: ORAL_TABLET | ORAL | Status: DC

## 2012-10-05 ENCOUNTER — Ambulatory Visit (INDEPENDENT_AMBULATORY_CARE_PROVIDER_SITE_OTHER): Payer: Medicare Other | Admitting: *Deleted

## 2012-10-05 DIAGNOSIS — I6350 Cerebral infarction due to unspecified occlusion or stenosis of unspecified cerebral artery: Secondary | ICD-10-CM

## 2012-10-05 DIAGNOSIS — Z7901 Long term (current) use of anticoagulants: Secondary | ICD-10-CM

## 2012-10-05 DIAGNOSIS — I4891 Unspecified atrial fibrillation: Secondary | ICD-10-CM

## 2012-10-05 LAB — POCT INR: INR: 1.6

## 2012-10-10 ENCOUNTER — Ambulatory Visit (INDEPENDENT_AMBULATORY_CARE_PROVIDER_SITE_OTHER): Payer: Medicare Other | Admitting: *Deleted

## 2012-10-10 ENCOUNTER — Encounter: Payer: Self-pay | Admitting: Cardiology

## 2012-10-10 ENCOUNTER — Ambulatory Visit (INDEPENDENT_AMBULATORY_CARE_PROVIDER_SITE_OTHER): Payer: Medicare Other | Admitting: Cardiology

## 2012-10-10 VITALS — BP 142/82 | HR 67 | Ht 68.0 in | Wt 206.0 lb

## 2012-10-10 DIAGNOSIS — I2510 Atherosclerotic heart disease of native coronary artery without angina pectoris: Secondary | ICD-10-CM

## 2012-10-10 DIAGNOSIS — Z7901 Long term (current) use of anticoagulants: Secondary | ICD-10-CM

## 2012-10-10 DIAGNOSIS — I4891 Unspecified atrial fibrillation: Secondary | ICD-10-CM

## 2012-10-10 DIAGNOSIS — I6350 Cerebral infarction due to unspecified occlusion or stenosis of unspecified cerebral artery: Secondary | ICD-10-CM

## 2012-10-10 LAB — POCT INR: INR: 2.1

## 2012-10-10 NOTE — Assessment & Plan Note (Signed)
Maintaining sinus rhythm. Asymptomatic. Continue anticoagulation. Check pro time today.

## 2012-10-10 NOTE — Patient Instructions (Addendum)
Your physician recommends that you continue on your current medications as directed. Please refer to the Current Medication list given to you today.  Your physician wants you to follow-up in: 6 months with Norma Fredrickson NP You will receive a reminder letter in the mail two months in advance. If you don't receive a letter, please call our office to schedule the follow-up appointment.

## 2012-10-10 NOTE — Progress Notes (Signed)
HPI Vincent [name] turns today for evaluation and management of his complex cardiovascular history he is actually doing well and denies any chest pain, angina, palpitations, orthopnea, presyncope or syncope, change in weight, or edema. He remains on Coumadin and is for a check today. He denies any bleeding or melena.  Past Medical History  Diagnosis Date  . Hypertension   . Stroke   . Syncope and collapse   . Hyperlipidemia   . Coronary artery disease   . Atrial fibrillation   . V-tach   . Bradycardia   . Alcoholic cardiomyopathy   . Hemorrhage of rectum and anus   . Cough   . Pulmonary eosinophilia   . Unspecified glaucoma(365.9)   . GERD (gastroesophageal reflux disease)   . Long term (current) use of anticoagulants     Current Outpatient Prescriptions  Medication Sig Dispense Refill  . aspirin 81 MG tablet Take 81 mg by mouth daily.        Marland Kitchen atorvastatin (LIPITOR) 40 MG tablet Take 1 tablet (40 mg total) by mouth daily.  30 tablet  11  . bimatoprost (LUMIGAN) 0.03 % ophthalmic solution Place 1 drop into both eyes daily.        . brimonidine (ALPHAGAN P) 0.1 % SOLN Place 1 drop into the right eye 3 (three) times daily.        . colchicine 0.6 MG tablet Take 0.6 mg by mouth as needed.      . famotidine (PEPCID) 20 MG tablet TAKE ONE (1) TABLET(S) TWICE DAILY  60 tablet  5  . irbesartan (AVAPRO) 300 MG tablet Take 1 tablet (300 mg total) by mouth daily.  30 tablet  11  . loratadine (CLARITIN) 10 MG tablet Take 10 mg by mouth daily.        . nitroGLYCERIN (NITROLINGUAL) 0.4 MG/SPRAY spray Place 1 spray under the tongue every 5 (five) minutes as needed.        . simvastatin (ZOCOR) 40 MG tablet Take one (1) tablet(s) once daily  30 tablet  11  . spironolactone (ALDACTONE) 25 MG tablet TAKE ONE (1) TABLET(S) ONCE DAILY  30 tablet  11  . Tamsulosin HCl (FLOMAX) 0.4 MG CAPS Take 0.4 mg by mouth daily.      Marland Kitchen warfarin (COUMADIN) 2 MG tablet Take as directed by coumadin clinic  180 tablet  1    No current facility-administered medications for this visit.    No Known Allergies  Family History  Problem Relation Age of Onset  . Heart attack Neg Hx   . Stroke Neg Hx     History   Social History  . Marital Status: Divorced    Spouse Name: N/A    Number of Children: N/A  . Years of Education: N/A   Occupational History  . Not on file.   Social History Main Topics  . Smoking status: Never Smoker   . Smokeless tobacco: Not on file  . Alcohol Use: No  . Drug Use: No  . Sexually Active: Not on file   Other Topics Concern  . Not on file   Social History Narrative  . No narrative on file    ROS ALL NEGATIVE EXCEPT THOSE NOTED IN HPI  PE  General Appearance: well developed, well nourished in no acute distress, obese HEENT: symmetrical face, PERRLA,  Neck: no JVD, thyromegaly, or adenopathy, trachea midline Chest: symmetric without deformity Cardiac: PMI non-displaced, RRR, normal S1, S2, no gallop or murmur Lung: clear to ausculation  and percussion Vascular: all pulses full without bruits  Abdominal: nondistended, nontender, good bowel sounds, no HSM, no bruits Extremities: no cyanosis, clubbing or edema, no sign of DVT, no varicosities  Skin: normal color, no rashes Neuro: alert and oriented x 3, non-focal Pysch: normal affect  EKG Normal sinus rhythm, ST segment changes inferolaterally, no acute changes.  BMET    Component Value Date/Time   NA 139 12/16/2010 0816   K 4.3 12/16/2010 0816   CL 105 12/16/2010 0816   CO2 29 12/16/2010 0816   GLUCOSE 115* 12/16/2010 0816   BUN 9 12/16/2010 0816   CREATININE 0.87 12/16/2010 0816   CALCIUM 9.0 12/16/2010 0816   GFRNONAA >60 12/16/2010 0816   GFRAA  Value: >60        The eGFR has been calculated using the MDRD equation. This calculation has not been validated in all clinical situations. eGFR's persistently <60 mL/min signify possible Chronic Kidney Disease. 12/16/2010 0816    Lipid Panel     Component Value  Date/Time   CHOL 152 04/12/2011 0923   TRIG 106.0 04/12/2011 0923   HDL 55.40 04/12/2011 0923   CHOLHDL 3 04/12/2011 0923   VLDL 21.2 04/12/2011 0923   LDLCALC 75 04/12/2011 0923    CBC    Component Value Date/Time   WBC 4.1 12/16/2010 0816   RBC 4.79 12/16/2010 0816   HGB 11.6* 12/16/2010 0816   HCT 36.9* 12/16/2010 0816   PLT 242 12/16/2010 0816   MCV 77.0* 12/16/2010 0816   MCH 24.2* 12/16/2010 0816   MCHC 31.4 12/16/2010 0816   RDW 15.7* 12/16/2010 1610

## 2012-10-10 NOTE — Assessment & Plan Note (Signed)
Stable. Continue secondary preventative therapy. Return the office in 6 months. 

## 2012-10-18 ENCOUNTER — Telehealth: Payer: Self-pay | Admitting: Gastroenterology

## 2012-10-18 NOTE — Telephone Encounter (Signed)
Pt reports he has been having blood in his stool. States every time he has a BM there is BRB in his stool. Requesting pt be seen asap. Pt scheduled to see Doug Sou PA tomorrow at 9:15am. [place] to notify pt of appt date and time.

## 2012-10-19 ENCOUNTER — Encounter: Payer: Self-pay | Admitting: Gastroenterology

## 2012-10-19 ENCOUNTER — Other Ambulatory Visit (INDEPENDENT_AMBULATORY_CARE_PROVIDER_SITE_OTHER): Payer: Medicare Other

## 2012-10-19 ENCOUNTER — Ambulatory Visit (INDEPENDENT_AMBULATORY_CARE_PROVIDER_SITE_OTHER): Payer: Medicare Other | Admitting: Gastroenterology

## 2012-10-19 VITALS — BP 110/60 | HR 82 | Ht 69.0 in | Wt 206.0 lb

## 2012-10-19 DIAGNOSIS — K625 Hemorrhage of anus and rectum: Secondary | ICD-10-CM

## 2012-10-19 DIAGNOSIS — K644 Residual hemorrhoidal skin tags: Secondary | ICD-10-CM

## 2012-10-19 LAB — CBC WITH DIFFERENTIAL/PLATELET
Basophils Absolute: 0 10*3/uL (ref 0.0–0.1)
Basophils Relative: 0.8 % (ref 0.0–3.0)
Eosinophils Absolute: 0.2 10*3/uL (ref 0.0–0.7)
Eosinophils Relative: 3.7 % (ref 0.0–5.0)
HCT: 32.5 % — ABNORMAL LOW (ref 39.0–52.0)
Hemoglobin: 10.7 g/dL — ABNORMAL LOW (ref 13.0–17.0)
Lymphocytes Relative: 18.7 % (ref 12.0–46.0)
Lymphs Abs: 0.9 10*3/uL (ref 0.7–4.0)
MCHC: 32.9 g/dL (ref 30.0–36.0)
MCV: 79.3 fl (ref 78.0–100.0)
Monocytes Absolute: 0.6 10*3/uL (ref 0.1–1.0)
Monocytes Relative: 12 % (ref 3.0–12.0)
Neutro Abs: 3.3 10*3/uL (ref 1.4–7.7)
Neutrophils Relative %: 64.8 % (ref 43.0–77.0)
Platelets: 229 10*3/uL (ref 150.0–400.0)
RBC: 4.09 Mil/uL — ABNORMAL LOW (ref 4.22–5.81)
RDW: 16.1 % — ABNORMAL HIGH (ref 11.5–14.6)
WBC: 5.1 10*3/uL (ref 4.5–10.5)

## 2012-10-19 LAB — PROTIME-INR
INR: 1.4 ratio — ABNORMAL HIGH (ref 0.8–1.0)
Prothrombin Time: 14.7 s — ABNORMAL HIGH (ref 10.2–12.4)

## 2012-10-19 MED ORDER — HYDROCORTISONE 2.5 % RE CREA: TOPICAL_CREAM | Freq: Two times a day (BID) | RECTAL | Status: DC

## 2012-10-19 NOTE — Progress Notes (Signed)
10/19/2012 SIR MALLIS [id number] [birth year]   History of Present Illness:  Patient is a pleasant 77 year old male who is a patient of Dr. Marzetta Board.  He presents to our office today with a one month history of rectal bleeding with BM's; on stool and in bowl.  Small amount seen on liner in his underwear at times as well.  No abdominal or rectal pain.  BM's once daily, normal other than seeing the blood.  On coumadin.  Last INR 2/12 was 2.1.  Colonoscopy 09/2010 showed four polyps which were removed and were tubular adenomas.  Also had mild diverticulosis.  Current Medications, Allergies, Past Medical History, Past Surgical History, Family History and Social History were reviewed in [employer] record.   Physical Exam: BP 110/60  Pulse 82  Ht 5\' 9"  (1.753 m)  Wt 206 lb (93.441 kg)  BMI 30.41 kg/m2 General: Well developed, black male in no acute distress Head: Normocephalic and atraumatic Eyes:  sclerae anicteric, conjunctiva pink  Ears: Normal auditory acuity Lungs: Clear throughout to auscultation Heart: Regular rate but rhythm irregular; no M/R/G. Abdomen: Soft, non tender and non distended. No masses, no hepatomegaly. Normal bowel sounds Rectal: External hemorrhoid noted with spot of bleeding identified.   Musculoskeletal: Symmetrical with no gross deformities  Extremities: No edema  Neurological: Alert oriented x 4, grossly nonfocal Psychological:  Alert and cooperative. Normal mood and affect  Assessment and Recommendations: -Rectal bleeding from external hemorrhoid:  Will check CBC and PT/INR.  Anusol cream to be applied BID for the next 1-2 weeks. -Coumadin coagulopathy

## 2012-10-19 NOTE — Progress Notes (Signed)
Reviewed and agree with management. Jeremias Broyhill D. Angele Wiemann, M.D., FACG  

## 2012-10-19 NOTE — Patient Instructions (Addendum)
Please go to the basement lab for blood work today A prescription has been sent to your pharmacy for anusol suppositories Please call back for any continued rectal bleeding or symptoms

## 2012-10-24 ENCOUNTER — Ambulatory Visit (INDEPENDENT_AMBULATORY_CARE_PROVIDER_SITE_OTHER): Payer: Medicare Other | Admitting: *Deleted

## 2012-10-24 DIAGNOSIS — Z7901 Long term (current) use of anticoagulants: Secondary | ICD-10-CM

## 2012-10-24 DIAGNOSIS — I4891 Unspecified atrial fibrillation: Secondary | ICD-10-CM

## 2012-10-24 DIAGNOSIS — I6350 Cerebral infarction due to unspecified occlusion or stenosis of unspecified cerebral artery: Secondary | ICD-10-CM

## 2012-10-24 LAB — POCT INR: INR: 1.4

## 2012-10-31 ENCOUNTER — Ambulatory Visit (INDEPENDENT_AMBULATORY_CARE_PROVIDER_SITE_OTHER): Payer: Medicare Other | Admitting: *Deleted

## 2012-10-31 DIAGNOSIS — Z7901 Long term (current) use of anticoagulants: Secondary | ICD-10-CM

## 2012-10-31 DIAGNOSIS — I6350 Cerebral infarction due to unspecified occlusion or stenosis of unspecified cerebral artery: Secondary | ICD-10-CM

## 2012-10-31 DIAGNOSIS — I4891 Unspecified atrial fibrillation: Secondary | ICD-10-CM

## 2012-10-31 LAB — POCT INR: INR: 1.9

## 2012-11-14 ENCOUNTER — Ambulatory Visit (INDEPENDENT_AMBULATORY_CARE_PROVIDER_SITE_OTHER): Payer: Medicare Other | Admitting: *Deleted

## 2012-11-14 DIAGNOSIS — I6350 Cerebral infarction due to unspecified occlusion or stenosis of unspecified cerebral artery: Secondary | ICD-10-CM

## 2012-11-14 DIAGNOSIS — Z7901 Long term (current) use of anticoagulants: Secondary | ICD-10-CM

## 2012-11-14 DIAGNOSIS — I4891 Unspecified atrial fibrillation: Secondary | ICD-10-CM

## 2012-11-14 LAB — POCT INR: INR: 2

## 2012-11-28 ENCOUNTER — Ambulatory Visit (INDEPENDENT_AMBULATORY_CARE_PROVIDER_SITE_OTHER): Payer: Medicare Other | Admitting: *Deleted

## 2012-11-28 DIAGNOSIS — Z7901 Long term (current) use of anticoagulants: Secondary | ICD-10-CM

## 2012-11-28 DIAGNOSIS — I6350 Cerebral infarction due to unspecified occlusion or stenosis of unspecified cerebral artery: Secondary | ICD-10-CM

## 2012-11-28 DIAGNOSIS — I4891 Unspecified atrial fibrillation: Secondary | ICD-10-CM

## 2012-11-28 LAB — POCT INR: INR: 1.9

## 2012-12-12 ENCOUNTER — Ambulatory Visit (INDEPENDENT_AMBULATORY_CARE_PROVIDER_SITE_OTHER): Payer: Medicare Other | Admitting: Pharmacist

## 2012-12-12 DIAGNOSIS — I6350 Cerebral infarction due to unspecified occlusion or stenosis of unspecified cerebral artery: Secondary | ICD-10-CM

## 2012-12-12 DIAGNOSIS — Z7901 Long term (current) use of anticoagulants: Secondary | ICD-10-CM

## 2012-12-12 DIAGNOSIS — I4891 Unspecified atrial fibrillation: Secondary | ICD-10-CM

## 2012-12-12 LAB — POCT INR: INR: 2.2

## 2013-01-02 ENCOUNTER — Ambulatory Visit (INDEPENDENT_AMBULATORY_CARE_PROVIDER_SITE_OTHER): Payer: Medicare Other | Admitting: *Deleted

## 2013-01-02 DIAGNOSIS — Z7901 Long term (current) use of anticoagulants: Secondary | ICD-10-CM

## 2013-01-02 DIAGNOSIS — I6350 Cerebral infarction due to unspecified occlusion or stenosis of unspecified cerebral artery: Secondary | ICD-10-CM

## 2013-01-02 DIAGNOSIS — I4891 Unspecified atrial fibrillation: Secondary | ICD-10-CM

## 2013-01-02 LAB — POCT INR: INR: 2.9

## 2013-01-30 ENCOUNTER — Ambulatory Visit (INDEPENDENT_AMBULATORY_CARE_PROVIDER_SITE_OTHER): Payer: Medicare Other | Admitting: *Deleted

## 2013-01-30 DIAGNOSIS — I4891 Unspecified atrial fibrillation: Secondary | ICD-10-CM

## 2013-01-30 DIAGNOSIS — I6350 Cerebral infarction due to unspecified occlusion or stenosis of unspecified cerebral artery: Secondary | ICD-10-CM

## 2013-01-30 DIAGNOSIS — Z7901 Long term (current) use of anticoagulants: Secondary | ICD-10-CM

## 2013-01-30 LAB — POCT INR: INR: 2.8

## 2013-02-15 ENCOUNTER — Telehealth: Payer: Self-pay | Admitting: Internal Medicine

## 2013-02-15 NOTE — Telephone Encounter (Signed)
02-15-13 lmm @ 1013am to set up past due pacer ck with allred/brooke, last check 09-2010, or let us know if someone else checking it/mt

## 2013-03-12 ENCOUNTER — Ambulatory Visit (INDEPENDENT_AMBULATORY_CARE_PROVIDER_SITE_OTHER): Payer: Medicare Other | Admitting: *Deleted

## 2013-03-12 ENCOUNTER — Encounter: Payer: Self-pay | Admitting: Cardiology

## 2013-03-12 ENCOUNTER — Ambulatory Visit (INDEPENDENT_AMBULATORY_CARE_PROVIDER_SITE_OTHER): Payer: Medicare Other | Admitting: Cardiology

## 2013-03-12 VITALS — BP 134/68 | HR 64 | Ht 69.0 in | Wt 204.8 lb

## 2013-03-12 DIAGNOSIS — I4891 Unspecified atrial fibrillation: Secondary | ICD-10-CM

## 2013-03-12 DIAGNOSIS — Z4509 Encounter for adjustment and management of other cardiac device: Secondary | ICD-10-CM

## 2013-03-12 DIAGNOSIS — I6350 Cerebral infarction due to unspecified occlusion or stenosis of unspecified cerebral artery: Secondary | ICD-10-CM

## 2013-03-12 DIAGNOSIS — I2510 Atherosclerotic heart disease of native coronary artery without angina pectoris: Secondary | ICD-10-CM

## 2013-03-12 DIAGNOSIS — Z7901 Long term (current) use of anticoagulants: Secondary | ICD-10-CM

## 2013-03-12 DIAGNOSIS — R42 Dizziness and giddiness: Secondary | ICD-10-CM

## 2013-03-12 LAB — POCT INR: INR: 3.8

## 2013-03-12 NOTE — Progress Notes (Signed)
ELECTROPHYSIOLOGY OFFICE NOTE  Patient ID: Vincent [name] MRN: [id number], DOB/AGE: Mar 27, 1936   Date of Visit: 03/12/2013  Primary Physician: Burtis Junes, MD Primary Cardiologist / EP: Daleen Squibb, MD / Graciela Husbands, MD Reason for Visit: EP/device follow-up  History of Present Illness  Vincent [name] is a 77 y.o. male  is seen following implantation of a loop recorder in February 2009 due to spells of dizziness described as presyncopal versus partial  seizures. He has had recurrent episodes while his ILR was functioning and there were no arrhythmias documented correlating to his symptoms. This visit was scheduled due to a recall that was sent to him in the mail in error. His ILR was deemed non-functioning back in April 2013 due to battery depletion as it was implanted in 2009.   He is also followed by Dr. Daleen Squibb. He has history of ischemic and nonischemic cardiomyopathy with prior PCI and previously depressed but now normal left ventricular function by echo in 2009. He has no complaints today. He has had no intercurrent spells. He denies CP or SOB. He denies palpitations, dizziness, near syncope or syncope. He denies LE swelling, orthopnea, PND or recent weight gain. He reports compliance with his medications.  Past Medical History Past Medical History  Diagnosis Date  . Hypertension   . Stroke   . Syncope and collapse   . Hyperlipidemia   . Coronary artery disease   . Atrial fibrillation   . V-tach   . Bradycardia   . Alcoholic cardiomyopathy   . Hemorrhage of rectum and anus   . Cough   . Pulmonary eosinophilia   . Unspecified glaucoma(365.9)   . GERD (gastroesophageal reflux disease)   . Long term (current) use of anticoagulants     Past Surgical History Past Surgical History  Procedure Laterality Date  . Loop recorder  09/2007  . Knee surgery      Allergies/Intolerances No Known Allergies  Current Home Medications Current Outpatient Prescriptions  Medication Sig Dispense  Refill  . aspirin 81 MG tablet Take 81 mg by mouth daily.        Marland Kitchen atorvastatin (LIPITOR) 40 MG tablet Take 1 tablet (40 mg total) by mouth daily.  30 tablet  11  . bimatoprost (LUMIGAN) 0.03 % ophthalmic solution Place 1 drop into both eyes daily.        . brimonidine (ALPHAGAN P) 0.1 % SOLN Place 1 drop into the right eye 3 (three) times daily.        . colchicine 0.6 MG tablet Take 0.6 mg by mouth as needed.      . famotidine (PEPCID) 20 MG tablet TAKE ONE (1) TABLET(S) TWICE DAILY  60 tablet  5  . hydrocortisone (ANUSOL-HC) 2.5 % rectal cream Place rectally 2 (two) times daily.  30 g  1  . irbesartan (AVAPRO) 300 MG tablet Take 1 tablet (300 mg total) by mouth daily.  30 tablet  11  . loratadine (CLARITIN) 10 MG tablet Take 10 mg by mouth daily.        . nitroGLYCERIN (NITROLINGUAL) 0.4 MG/SPRAY spray Place 1 spray under the tongue every 5 (five) minutes as needed.        Marland Kitchen spironolactone (ALDACTONE) 25 MG tablet TAKE ONE (1) TABLET(S) ONCE DAILY  30 tablet  11  . Tamsulosin HCl (FLOMAX) 0.4 MG CAPS Take 0.4 mg by mouth daily.      Marland Kitchen warfarin (COUMADIN) 2 MG tablet Take as directed by coumadin clinic  180 tablet  1   No current facility-administered medications for this visit.   Social History History   Social History  . Marital Status: Divorced    Spouse Name: N/A    Number of Children: N/A  . Years of Education: N/A   Occupational History  . Not on file.   Social History Main Topics  . Smoking status: Former Smoker    Quit date: 08/22/1983  . Smokeless tobacco: Not on file  . Alcohol Use: No  . Drug Use: No  . Sexually Active: Not on file   Other Topics Concern  . Not on file   Social History Narrative  . No narrative on file    Review of Systems General: No chills, fever, night sweats or weight changes Cardiovascular: No chest pain, dyspnea on exertion, edema, orthopnea, palpitations, paroxysmal nocturnal dyspnea Dermatological: No rash, lesions or  masses Respiratory: No cough, dyspnea Urologic: No hematuria, dysuria Abdominal: No nausea, vomiting, diarrhea, bright red blood per rectum, melena, or hematemesis Neurologic: No visual changes, weakness, changes in mental status All other systems reviewed and are otherwise negative except as noted above.  Physical Exam Vitals: Blood pressure 134/68, pulse 64, height 5\' 9"  (1.753 m), weight 204 lb 12.8 oz (92.897 kg), SpO2 97.00%.  General: Well developed, well appearing 77 y.o. male in no acute distress. HEENT: Normocephalic, atraumatic. EOMs intact. Sclera nonicteric. Oropharynx clear.  Neck: Supple without bruits. No JVD. Lungs: Respirations regular and unlabored, CTA bilaterally. No wheezes, rales or rhonchi. Heart: RRR. S1, S2 present. No murmurs, rub, S3 or S4. Abdomen: Soft, non-distended.   Extremities: No clubbing, cyanosis or edema. PT/Radials 2+ and equal bilaterally. Psych: Normal affect. Neuro: Alert and oriented X 3. Moves all extremities spontaneously.   Assessment and Plan 1. Dizziness  - No intercurrent episodes  - ILR nonfunctioning due to age / battery depletion  - Vincent [name] reports he did have episodes while his ILR was functioning and there were no arrhythmias documented  - Discussed whether or not he would like to have the ILR removed; however, he does not wish to have it removed and states it is not a bother to him 2. CAD Stable without anginal symptoms Continue medical therapy Keep scheduled follow-up with Dr. Daleen Squibb in 6 months  Signed, Rick Duff, PA-C 03/12/2013, 9:39 PM

## 2013-03-12 NOTE — Patient Instructions (Signed)
We will cancel your appointment with Lawson Fiscal G.-PA and Rick Duff will do your office visit today while your here.   Your physician wants you to follow-up in: 6 MONTH OV with Dr. Dorinda Hill will receive a reminder letter in the mail two months in advance. If you don't receive a letter, please call our office to schedule the follow-up appointment.

## 2013-03-20 ENCOUNTER — Ambulatory Visit: Payer: Medicare Other | Admitting: Nurse Practitioner

## 2013-03-26 ENCOUNTER — Other Ambulatory Visit: Payer: Self-pay | Admitting: Gastroenterology

## 2013-04-02 ENCOUNTER — Ambulatory Visit (INDEPENDENT_AMBULATORY_CARE_PROVIDER_SITE_OTHER): Payer: Medicare Other | Admitting: *Deleted

## 2013-04-02 DIAGNOSIS — I6350 Cerebral infarction due to unspecified occlusion or stenosis of unspecified cerebral artery: Secondary | ICD-10-CM

## 2013-04-02 DIAGNOSIS — Z7901 Long term (current) use of anticoagulants: Secondary | ICD-10-CM

## 2013-04-02 DIAGNOSIS — I4891 Unspecified atrial fibrillation: Secondary | ICD-10-CM

## 2013-04-02 LAB — POCT INR: INR: 3.9

## 2013-04-08 ENCOUNTER — Other Ambulatory Visit: Payer: Self-pay | Admitting: Cardiology

## 2013-04-12 ENCOUNTER — Other Ambulatory Visit: Payer: Self-pay | Admitting: Cardiology

## 2013-04-16 ENCOUNTER — Ambulatory Visit (INDEPENDENT_AMBULATORY_CARE_PROVIDER_SITE_OTHER): Payer: Medicare Other | Admitting: Pharmacist

## 2013-04-16 DIAGNOSIS — Z7901 Long term (current) use of anticoagulants: Secondary | ICD-10-CM

## 2013-04-16 DIAGNOSIS — I6350 Cerebral infarction due to unspecified occlusion or stenosis of unspecified cerebral artery: Secondary | ICD-10-CM

## 2013-04-16 DIAGNOSIS — I4891 Unspecified atrial fibrillation: Secondary | ICD-10-CM

## 2013-04-16 LAB — POCT INR: INR: 1.5

## 2013-04-22 ENCOUNTER — Other Ambulatory Visit: Payer: Self-pay | Admitting: Cardiology

## 2013-04-30 ENCOUNTER — Ambulatory Visit (INDEPENDENT_AMBULATORY_CARE_PROVIDER_SITE_OTHER): Payer: Medicare Other | Admitting: *Deleted

## 2013-04-30 DIAGNOSIS — Z7901 Long term (current) use of anticoagulants: Secondary | ICD-10-CM

## 2013-04-30 DIAGNOSIS — I4891 Unspecified atrial fibrillation: Secondary | ICD-10-CM

## 2013-04-30 DIAGNOSIS — I6350 Cerebral infarction due to unspecified occlusion or stenosis of unspecified cerebral artery: Secondary | ICD-10-CM

## 2013-04-30 LAB — POCT INR: INR: 3

## 2013-05-21 ENCOUNTER — Ambulatory Visit (INDEPENDENT_AMBULATORY_CARE_PROVIDER_SITE_OTHER): Payer: Medicare Other

## 2013-05-21 DIAGNOSIS — I4891 Unspecified atrial fibrillation: Secondary | ICD-10-CM

## 2013-05-21 DIAGNOSIS — I6350 Cerebral infarction due to unspecified occlusion or stenosis of unspecified cerebral artery: Secondary | ICD-10-CM

## 2013-05-21 DIAGNOSIS — Z7901 Long term (current) use of anticoagulants: Secondary | ICD-10-CM

## 2013-05-21 LAB — POCT INR: INR: 1.7

## 2013-06-11 ENCOUNTER — Ambulatory Visit (INDEPENDENT_AMBULATORY_CARE_PROVIDER_SITE_OTHER): Payer: Medicare Other | Admitting: *Deleted

## 2013-06-11 DIAGNOSIS — Z7901 Long term (current) use of anticoagulants: Secondary | ICD-10-CM

## 2013-06-11 DIAGNOSIS — I6350 Cerebral infarction due to unspecified occlusion or stenosis of unspecified cerebral artery: Secondary | ICD-10-CM

## 2013-06-11 DIAGNOSIS — I4891 Unspecified atrial fibrillation: Secondary | ICD-10-CM

## 2013-06-11 LAB — POCT INR
INR: 2.6
INR: 2.6

## 2013-06-12 ENCOUNTER — Other Ambulatory Visit: Payer: Self-pay | Admitting: Cardiology

## 2013-07-05 ENCOUNTER — Ambulatory Visit (INDEPENDENT_AMBULATORY_CARE_PROVIDER_SITE_OTHER): Payer: Medicare Other | Admitting: Pharmacist

## 2013-07-05 DIAGNOSIS — I4891 Unspecified atrial fibrillation: Secondary | ICD-10-CM

## 2013-07-05 DIAGNOSIS — Z7901 Long term (current) use of anticoagulants: Secondary | ICD-10-CM

## 2013-07-05 DIAGNOSIS — I6350 Cerebral infarction due to unspecified occlusion or stenosis of unspecified cerebral artery: Secondary | ICD-10-CM

## 2013-07-05 LAB — POCT INR: INR: 1.4

## 2013-07-16 ENCOUNTER — Ambulatory Visit (INDEPENDENT_AMBULATORY_CARE_PROVIDER_SITE_OTHER): Payer: Medicare Other | Admitting: *Deleted

## 2013-07-16 DIAGNOSIS — I6350 Cerebral infarction due to unspecified occlusion or stenosis of unspecified cerebral artery: Secondary | ICD-10-CM

## 2013-07-16 DIAGNOSIS — Z7901 Long term (current) use of anticoagulants: Secondary | ICD-10-CM

## 2013-07-16 DIAGNOSIS — I4891 Unspecified atrial fibrillation: Secondary | ICD-10-CM

## 2013-07-16 LAB — POCT INR: INR: 3.4

## 2013-07-30 ENCOUNTER — Ambulatory Visit (INDEPENDENT_AMBULATORY_CARE_PROVIDER_SITE_OTHER): Payer: Medicare Other | Admitting: Pharmacist

## 2013-07-30 DIAGNOSIS — I4891 Unspecified atrial fibrillation: Secondary | ICD-10-CM

## 2013-07-30 DIAGNOSIS — Z7901 Long term (current) use of anticoagulants: Secondary | ICD-10-CM

## 2013-07-30 DIAGNOSIS — I6350 Cerebral infarction due to unspecified occlusion or stenosis of unspecified cerebral artery: Secondary | ICD-10-CM

## 2013-07-30 LAB — POCT INR: INR: 2.1

## 2013-08-20 ENCOUNTER — Ambulatory Visit (INDEPENDENT_AMBULATORY_CARE_PROVIDER_SITE_OTHER): Payer: Medicare Other | Admitting: *Deleted

## 2013-08-20 DIAGNOSIS — I6350 Cerebral infarction due to unspecified occlusion or stenosis of unspecified cerebral artery: Secondary | ICD-10-CM

## 2013-08-20 DIAGNOSIS — Z7901 Long term (current) use of anticoagulants: Secondary | ICD-10-CM

## 2013-08-20 DIAGNOSIS — I4891 Unspecified atrial fibrillation: Secondary | ICD-10-CM

## 2013-08-20 LAB — POCT INR: INR: 2.7

## 2013-09-03 ENCOUNTER — Ambulatory Visit
Admission: RE | Admit: 2013-09-03 | Discharge: 2013-09-03 | Disposition: A | Discharge status: Home / Self Care | Payer: Medicare Other | Source: Ambulatory Visit | Attending: Family Medicine | Admitting: Family Medicine

## 2013-09-03 ENCOUNTER — Other Ambulatory Visit: Payer: Self-pay | Admitting: Family Medicine

## 2013-09-03 DIAGNOSIS — M7989 Other specified soft tissue disorders: Secondary | ICD-10-CM

## 2013-09-09 ENCOUNTER — Ambulatory Visit: Payer: Medicare Other | Admitting: Cardiology

## 2013-09-10 ENCOUNTER — Ambulatory Visit (INDEPENDENT_AMBULATORY_CARE_PROVIDER_SITE_OTHER): Payer: Medicare Other | Admitting: Cardiology

## 2013-09-10 ENCOUNTER — Encounter: Payer: Self-pay | Admitting: Cardiology

## 2013-09-10 ENCOUNTER — Ambulatory Visit (INDEPENDENT_AMBULATORY_CARE_PROVIDER_SITE_OTHER): Payer: Medicare Other

## 2013-09-10 VITALS — BP 144/82 | HR 53 | Ht 69.0 in | Wt 205.0 lb

## 2013-09-10 DIAGNOSIS — I4891 Unspecified atrial fibrillation: Secondary | ICD-10-CM

## 2013-09-10 DIAGNOSIS — Z7901 Long term (current) use of anticoagulants: Secondary | ICD-10-CM

## 2013-09-10 DIAGNOSIS — I498 Other specified cardiac arrhythmias: Secondary | ICD-10-CM

## 2013-09-10 DIAGNOSIS — I6350 Cerebral infarction due to unspecified occlusion or stenosis of unspecified cerebral artery: Secondary | ICD-10-CM

## 2013-09-10 LAB — POCT INR: INR: 3.7

## 2013-09-10 MED ORDER — NITROGLYCERIN 0.4 MG/SPRAY TL SOLN: 1.0000 | Status: DC | PRN

## 2013-09-10 NOTE — Patient Instructions (Signed)
**Note De-identified  Obfuscation** Your physician recommends that you continue on your current medications as directed. Please refer to the Current Medication list given to you today.  Your physician wants you to follow-up in: 1 year. You will receive a reminder letter in the mail two months in advance. If you don't receive a letter, please call our office to schedule the follow-up appointment.  

## 2013-09-10 NOTE — Progress Notes (Signed)
Patient ID: JEMAR PAULSEN, male   DOB: 11-10-1935, 78 y.o.   MRN: [id number]    Patient Name: Vincent [name] Date of Encounter: 09/10/2013  Primary Care Provider:  [name], MD Primary Cardiologist:  Ena Dawley, H (prior Dr Verl Blalock)  Problem List   Past Medical History  Diagnosis Date  . Hypertension   . Stroke   . Syncope and collapse   . Hyperlipidemia   . Coronary artery disease   . Atrial fibrillation   . V-tach   . Bradycardia   . Alcoholic cardiomyopathy   . Hemorrhage of rectum and anus   . Cough   . Pulmonary eosinophilia   . Unspecified glaucoma   . GERD (gastroesophageal reflux disease)   . Long term (current) use of anticoagulants    Past Surgical History  Procedure Laterality Date  . Loop recorder  09/2007  . Knee surgery      Allergies  No Known Allergies  HPI  Mr Range returns today for evaluation and management of his complex cardiovascular history, ischemic and nonischemic cardiomyopathy with prior PCI and previously depressed but now normal left ventricular function by echo in 2009. He is actually doing well and denies any chest pain, angina, palpitations, orthopnea, presyncope or syncope, change in weight, or edema. He remains on Coumadin that is always therapeutic.  He denies any bleeding or melena.  He is also seen in the devise clinic for implantation of a loop recorder in February 2009 due to spells of dizziness described as presyncopal versus partial  seizures. He has had recurrent episodes while his ILR was functioning and there were no arrhythmias documented correlating to his symptoms. His ILR was deemed non-functioning back in April 2013 due to battery depletion as it was implanted in 2009.   He reports dizziness about twice a year but no syncope in the last few years. He reports compliance with his medications.  Home Medications  Prior to Admission medications   Medication Sig Start Date End Date Taking? Authorizing Provider   aspirin 81 MG tablet Take 81 mg by mouth daily.     Yes Historical Provider, MD  atorvastatin (LIPITOR) 40 MG tablet TAKE 1 TABLET BY MOUTH DAILY 04/22/13  Yes Renella Cunas, MD  bimatoprost (LUMIGAN) 0.03 % ophthalmic solution Place 1 drop into both eyes daily.     Yes Historical Provider, MD  brimonidine (ALPHAGAN P) 0.1 % SOLN Place 1 drop into the right eye 3 (three) times daily.     Yes Historical Provider, MD  colchicine 0.6 MG tablet Take 0.6 mg by mouth as needed.   Yes Historical Provider, MD  famotidine (PEPCID) 20 MG tablet TAKE ONE (1) TABLET(S) TWICE DAILY 11/29/11  Yes Renella Cunas, MD  irbesartan (AVAPRO) 300 MG tablet TAKE 1 TABLET BY MOUTH DAILY 06/12/13  Yes [name], MD  loratadine (CLARITIN) 10 MG tablet Take 10 mg by mouth daily.     Yes Historical Provider, MD  nitroGLYCERIN (NITROLINGUAL) 0.4 MG/SPRAY spray Place 1 spray under the tongue every 5 (five) minutes as needed.     Yes Historical Provider, MD  PROCTOZONE-HC 2.5 % rectal cream PLACE RECTALLY TWICE DAILY 03/26/13  Yes Jessica D. Zehr, PA-C  spironolactone (ALDACTONE) 25 MG tablet TAKE ONE TABLET BY MOUTH ONE TIME DAILY 04/12/13  Yes Renella Cunas, MD  Tamsulosin HCl (FLOMAX) 0.4 MG CAPS Take 0.4 mg by mouth daily. 12/28/11  Yes Historical Provider, MD  warfarin (COUMADIN) 2 MG tablet  TAKE AS DIRECTED BY CUMADIN CLINIC 04/08/13  Yes Renella Cunas, MD    Family History  Family History  Problem Relation Age of Onset  . Heart attack Neg Hx   . Stroke Neg Hx   . Colon cancer Mother   . Prostate cancer Brother   . Heart disease Father     Social History  History   Social History  . Marital Status: Divorced    Spouse Name: N/A    Number of Children: N/A  . Years of Education: N/A   Occupational History  . Not on file.   Social History Main Topics  . Smoking status: Former Smoker    Quit date: 08/22/1983  . Smokeless tobacco: Not on file  . Alcohol Use: No  . Drug Use: No  . Sexual Activity: Not  on file   Other Topics Concern  . Not on file   Social History Narrative  . No narrative on file     Review of Systems, as per HPI, otherwise negative General:  No chills, fever, night sweats or weight changes.  Cardiovascular:  No chest pain, dyspnea on exertion, edema, orthopnea, palpitations, paroxysmal nocturnal dyspnea. Dermatological: No rash, lesions/masses Respiratory: No cough, dyspnea Urologic: No hematuria, dysuria Abdominal:   No nausea, vomiting, diarrhea, bright red blood per rectum, melena, or hematemesis Neurologic:  No visual changes, wkns, changes in mental status. All other systems reviewed and are otherwise negative except as noted above.  Physical Exam  Blood pressure 144/82, pulse 53, height 5\' 9"  (1.753 m), weight 205 lb (92.987 kg).  General: Pleasant, NAD Psych: Normal affect. Neuro: Alert and oriented X 3. Moves all extremities spontaneously. HEENT: Normal  Neck: Supple without bruits or JVD. Lungs:  Resp regular and unlabored, CTA. Heart: RRR no s3, s4, or murmurs. Abdomen: Soft, non-tender, non-distended, BS + x 4.  Extremities: No clubbing, cyanosis or edema. DP/PT/Radials 2+ and equal bilaterally.  Labs:  No results found for this basename: CKTOTAL, CKMB, TROPONINI,  in the last 72 hours Lab Results  Component Value Date   WBC 5.1 10/19/2012   HGB 10.7* 10/19/2012   HCT 32.5* 10/19/2012   MCV 79.3 10/19/2012   PLT 229.0 10/19/2012   No results found for this basename: NA, K, CL, CO2, BUN, CREATININE, CALCIUM, LABALBU, PROT, BILITOT, ALKPHOS, ALT, AST, GLUCOSE,  in the last 168 hours Lab Results  Component Value Date   CHOL 152 04/12/2011   HDL 55.40 04/12/2011   LDLCALC 75 04/12/2011   TRIG 106.0 04/12/2011   No results found for this basename: DDIMER   No components found with this basename: POCBNP,   Accessory Clinical Findings  echocardiogram  ECG - SR, negative T waves in the inferior leads, unchanged from the prior ECG in  09/2012   Assessment & Plan  78 year old   1. H/o I-CMP and NICMP, depressed LVEF, but normal on scho in 2009, asymptomatic, no need to repeat  2. H/o presyncopal episodes - loop recording didn't correlate with episodes, he still has non-functioning loop recorder and has no desire to remove it  3. HTN - borderline - most probably white coat sy, normal at the PCP 2 weeks ago No change to meds  4. HLP - on statin, followed by PCP  Follow up in 1 year, refill for NTG given   [name], MD, [facility] 09/10/2013, 3:13 PM

## 2013-09-24 ENCOUNTER — Ambulatory Visit (INDEPENDENT_AMBULATORY_CARE_PROVIDER_SITE_OTHER): Payer: Medicare Other | Admitting: *Deleted

## 2013-09-24 DIAGNOSIS — Z5181 Encounter for therapeutic drug level monitoring: Secondary | ICD-10-CM | POA: Insufficient documentation

## 2013-09-24 DIAGNOSIS — I6350 Cerebral infarction due to unspecified occlusion or stenosis of unspecified cerebral artery: Secondary | ICD-10-CM

## 2013-09-24 DIAGNOSIS — I4891 Unspecified atrial fibrillation: Secondary | ICD-10-CM

## 2013-09-24 DIAGNOSIS — Z7901 Long term (current) use of anticoagulants: Secondary | ICD-10-CM

## 2013-09-24 LAB — POCT INR: INR: 2.4

## 2013-10-11 ENCOUNTER — Other Ambulatory Visit: Payer: Self-pay | Admitting: Cardiology

## 2013-10-17 ENCOUNTER — Ambulatory Visit (INDEPENDENT_AMBULATORY_CARE_PROVIDER_SITE_OTHER): Payer: Medicare Other

## 2013-10-17 DIAGNOSIS — Z5181 Encounter for therapeutic drug level monitoring: Secondary | ICD-10-CM

## 2013-10-17 DIAGNOSIS — I6350 Cerebral infarction due to unspecified occlusion or stenosis of unspecified cerebral artery: Secondary | ICD-10-CM

## 2013-10-17 DIAGNOSIS — I4891 Unspecified atrial fibrillation: Secondary | ICD-10-CM

## 2013-10-17 DIAGNOSIS — Z7901 Long term (current) use of anticoagulants: Secondary | ICD-10-CM

## 2013-10-17 LAB — POCT INR: INR: 2

## 2013-11-03 ENCOUNTER — Other Ambulatory Visit: Payer: Self-pay | Admitting: Cardiology

## 2013-11-06 ENCOUNTER — Telehealth: Payer: Self-pay | Admitting: Pharmacist

## 2013-11-14 ENCOUNTER — Ambulatory Visit (INDEPENDENT_AMBULATORY_CARE_PROVIDER_SITE_OTHER): Payer: Medicare Other | Admitting: *Deleted

## 2013-11-14 DIAGNOSIS — Z7901 Long term (current) use of anticoagulants: Secondary | ICD-10-CM

## 2013-11-14 DIAGNOSIS — I6350 Cerebral infarction due to unspecified occlusion or stenosis of unspecified cerebral artery: Secondary | ICD-10-CM

## 2013-11-14 DIAGNOSIS — I4891 Unspecified atrial fibrillation: Secondary | ICD-10-CM

## 2013-11-14 DIAGNOSIS — Z5181 Encounter for therapeutic drug level monitoring: Secondary | ICD-10-CM

## 2013-11-14 LAB — POCT INR: INR: 2

## 2013-12-12 ENCOUNTER — Ambulatory Visit (INDEPENDENT_AMBULATORY_CARE_PROVIDER_SITE_OTHER): Payer: Medicare Other | Admitting: Pharmacist Clinician (PhC)/ Clinical Pharmacy Specialist

## 2013-12-12 DIAGNOSIS — I4891 Unspecified atrial fibrillation: Secondary | ICD-10-CM

## 2013-12-12 DIAGNOSIS — I6350 Cerebral infarction due to unspecified occlusion or stenosis of unspecified cerebral artery: Secondary | ICD-10-CM

## 2013-12-12 DIAGNOSIS — Z7901 Long term (current) use of anticoagulants: Secondary | ICD-10-CM

## 2013-12-12 DIAGNOSIS — Z5181 Encounter for therapeutic drug level monitoring: Secondary | ICD-10-CM

## 2013-12-12 LAB — POCT INR: INR: 2.7

## 2013-12-20 ENCOUNTER — Other Ambulatory Visit: Payer: Self-pay

## 2013-12-20 ENCOUNTER — Other Ambulatory Visit: Payer: Self-pay | Admitting: Cardiology

## 2013-12-24 ENCOUNTER — Other Ambulatory Visit: Payer: Self-pay | Admitting: Cardiology

## 2014-01-09 ENCOUNTER — Ambulatory Visit (INDEPENDENT_AMBULATORY_CARE_PROVIDER_SITE_OTHER): Payer: Medicare Other | Admitting: *Deleted

## 2014-01-09 DIAGNOSIS — I4891 Unspecified atrial fibrillation: Secondary | ICD-10-CM

## 2014-01-09 DIAGNOSIS — Z7901 Long term (current) use of anticoagulants: Secondary | ICD-10-CM

## 2014-01-09 DIAGNOSIS — I6350 Cerebral infarction due to unspecified occlusion or stenosis of unspecified cerebral artery: Secondary | ICD-10-CM

## 2014-01-09 DIAGNOSIS — Z5181 Encounter for therapeutic drug level monitoring: Secondary | ICD-10-CM

## 2014-01-09 LAB — POCT INR: INR: 1.8

## 2014-01-23 ENCOUNTER — Ambulatory Visit (INDEPENDENT_AMBULATORY_CARE_PROVIDER_SITE_OTHER): Payer: Medicare Other | Admitting: *Deleted

## 2014-01-23 DIAGNOSIS — Z5181 Encounter for therapeutic drug level monitoring: Secondary | ICD-10-CM

## 2014-01-23 DIAGNOSIS — Z7901 Long term (current) use of anticoagulants: Secondary | ICD-10-CM

## 2014-01-23 DIAGNOSIS — I6350 Cerebral infarction due to unspecified occlusion or stenosis of unspecified cerebral artery: Secondary | ICD-10-CM

## 2014-01-23 DIAGNOSIS — I4891 Unspecified atrial fibrillation: Secondary | ICD-10-CM

## 2014-01-23 LAB — POCT INR: INR: 3.6

## 2014-02-06 ENCOUNTER — Ambulatory Visit (INDEPENDENT_AMBULATORY_CARE_PROVIDER_SITE_OTHER): Payer: Medicare Other

## 2014-02-06 DIAGNOSIS — Z5181 Encounter for therapeutic drug level monitoring: Secondary | ICD-10-CM

## 2014-02-06 DIAGNOSIS — Z7901 Long term (current) use of anticoagulants: Secondary | ICD-10-CM

## 2014-02-06 DIAGNOSIS — I6350 Cerebral infarction due to unspecified occlusion or stenosis of unspecified cerebral artery: Secondary | ICD-10-CM

## 2014-02-06 DIAGNOSIS — I4891 Unspecified atrial fibrillation: Secondary | ICD-10-CM

## 2014-02-06 LAB — POCT INR: INR: 2.9

## 2014-02-27 ENCOUNTER — Ambulatory Visit (INDEPENDENT_AMBULATORY_CARE_PROVIDER_SITE_OTHER): Payer: Medicare Other | Admitting: Pharmacist Clinician (PhC)/ Clinical Pharmacy Specialist

## 2014-02-27 DIAGNOSIS — Z7901 Long term (current) use of anticoagulants: Secondary | ICD-10-CM

## 2014-02-27 DIAGNOSIS — I4891 Unspecified atrial fibrillation: Secondary | ICD-10-CM

## 2014-02-27 DIAGNOSIS — Z5181 Encounter for therapeutic drug level monitoring: Secondary | ICD-10-CM

## 2014-02-27 DIAGNOSIS — I6350 Cerebral infarction due to unspecified occlusion or stenosis of unspecified cerebral artery: Secondary | ICD-10-CM

## 2014-02-27 LAB — POCT INR: INR: 3.6

## 2014-03-14 ENCOUNTER — Other Ambulatory Visit: Payer: Self-pay | Admitting: Gastroenterology

## 2014-03-14 NOTE — Telephone Encounter (Signed)
Please advise if ok to refill, thank you.

## 2014-03-14 NOTE — Telephone Encounter (Signed)
LM on patient's VM to call me back so I can see what symptoms he is currently having.

## 2014-03-14 NOTE — Telephone Encounter (Signed)
Please speak with patient and see if he is having symptoms, etc.  He has not been seen in over a year.  Thank you,  Jess

## 2014-03-17 ENCOUNTER — Emergency Department (HOSPITAL_COMMUNITY): Payer: Medicare Other

## 2014-03-17 ENCOUNTER — Observation Stay (HOSPITAL_COMMUNITY)
Admission: EM | Admit: 2014-03-17 | Discharge: 2014-03-20 | Disposition: A | Discharge status: Home Health | Payer: Medicare Other | Attending: Family Medicine | Admitting: Family Medicine

## 2014-03-17 ENCOUNTER — Inpatient Hospital Stay (HOSPITAL_COMMUNITY): Payer: Medicare Other

## 2014-03-17 ENCOUNTER — Encounter (HOSPITAL_COMMUNITY): Payer: Self-pay | Admitting: Emergency Medicine

## 2014-03-17 DIAGNOSIS — I498 Other specified cardiac arrhythmias: Secondary | ICD-10-CM | POA: Diagnosis present

## 2014-03-17 DIAGNOSIS — I4891 Unspecified atrial fibrillation: Secondary | ICD-10-CM | POA: Diagnosis not present

## 2014-03-17 DIAGNOSIS — R0989 Other specified symptoms and signs involving the circulatory and respiratory systems: Secondary | ICD-10-CM | POA: Diagnosis not present

## 2014-03-17 DIAGNOSIS — R0609 Other forms of dyspnea: Secondary | ICD-10-CM | POA: Insufficient documentation

## 2014-03-17 DIAGNOSIS — K219 Gastro-esophageal reflux disease without esophagitis: Secondary | ICD-10-CM | POA: Diagnosis not present

## 2014-03-17 DIAGNOSIS — H18419 Arcus senilis, unspecified eye: Secondary | ICD-10-CM | POA: Diagnosis not present

## 2014-03-17 DIAGNOSIS — I4819 Other persistent atrial fibrillation: Secondary | ICD-10-CM | POA: Diagnosis present

## 2014-03-17 DIAGNOSIS — I426 Alcoholic cardiomyopathy: Secondary | ICD-10-CM | POA: Diagnosis not present

## 2014-03-17 DIAGNOSIS — I10 Essential (primary) hypertension: Secondary | ICD-10-CM | POA: Diagnosis not present

## 2014-03-17 DIAGNOSIS — Z87891 Personal history of nicotine dependence: Secondary | ICD-10-CM | POA: Insufficient documentation

## 2014-03-17 DIAGNOSIS — I2510 Atherosclerotic heart disease of native coronary artery without angina pectoris: Secondary | ICD-10-CM | POA: Diagnosis not present

## 2014-03-17 DIAGNOSIS — Z7982 Long term (current) use of aspirin: Secondary | ICD-10-CM | POA: Diagnosis not present

## 2014-03-17 DIAGNOSIS — D649 Anemia, unspecified: Secondary | ICD-10-CM | POA: Insufficient documentation

## 2014-03-17 DIAGNOSIS — E785 Hyperlipidemia, unspecified: Secondary | ICD-10-CM | POA: Insufficient documentation

## 2014-03-17 DIAGNOSIS — I209 Angina pectoris, unspecified: Secondary | ICD-10-CM | POA: Insufficient documentation

## 2014-03-17 DIAGNOSIS — M109 Gout, unspecified: Secondary | ICD-10-CM | POA: Insufficient documentation

## 2014-03-17 DIAGNOSIS — Z7901 Long term (current) use of anticoagulants: Secondary | ICD-10-CM | POA: Insufficient documentation

## 2014-03-17 DIAGNOSIS — R072 Precordial pain: Secondary | ICD-10-CM | POA: Diagnosis present

## 2014-03-17 DIAGNOSIS — Z8673 Personal history of transient ischemic attack (TIA), and cerebral infarction without residual deficits: Secondary | ICD-10-CM | POA: Insufficient documentation

## 2014-03-17 DIAGNOSIS — R001 Bradycardia, unspecified: Secondary | ICD-10-CM

## 2014-03-17 DIAGNOSIS — I480 Paroxysmal atrial fibrillation: Secondary | ICD-10-CM

## 2014-03-17 DIAGNOSIS — R42 Dizziness and giddiness: Secondary | ICD-10-CM | POA: Diagnosis present

## 2014-03-17 DIAGNOSIS — R55 Syncope and collapse: Secondary | ICD-10-CM | POA: Diagnosis not present

## 2014-03-17 LAB — RAPID URINE DRUG SCREEN, HOSP PERFORMED
Amphetamines: NOT DETECTED
Barbiturates: NOT DETECTED
Benzodiazepines: NOT DETECTED
Cocaine: NOT DETECTED
Opiates: NOT DETECTED
Tetrahydrocannabinol: NOT DETECTED

## 2014-03-17 LAB — PROTIME-INR
INR: 2.32 — ABNORMAL HIGH (ref 0.00–1.49)
Prothrombin Time: 25.5 seconds — ABNORMAL HIGH (ref 11.6–15.2)

## 2014-03-17 LAB — I-STAT TROPONIN, ED: Troponin i, poc: 0 ng/mL (ref 0.00–0.08)

## 2014-03-17 LAB — CBC
HCT: 35.8 % — ABNORMAL LOW (ref 39.0–52.0)
Hemoglobin: 11.4 g/dL — ABNORMAL LOW (ref 13.0–17.0)
MCH: 26.6 pg (ref 26.0–34.0)
MCHC: 31.8 g/dL (ref 30.0–36.0)
MCV: 83.4 fL (ref 78.0–100.0)
Platelets: 224 10*3/uL (ref 150–400)
RBC: 4.29 MIL/uL (ref 4.22–5.81)
RDW: 15.7 % — ABNORMAL HIGH (ref 11.5–15.5)
WBC: 5.4 10*3/uL (ref 4.0–10.5)

## 2014-03-17 LAB — TSH: TSH: 1.59 u[IU]/mL (ref 0.350–4.500)

## 2014-03-17 LAB — BASIC METABOLIC PANEL
Anion gap: 15 (ref 5–15)
BUN: 7 mg/dL (ref 6–23)
CO2: 24 mEq/L (ref 19–32)
Calcium: 8.7 mg/dL (ref 8.4–10.5)
Chloride: 100 mEq/L (ref 96–112)
Creatinine, Ser: 0.86 mg/dL (ref 0.50–1.35)
GFR calc Af Amer: >90 mL/min (ref >90)
GFR calc non Af Amer: 82 mL/min — ABNORMAL LOW (ref >90)
Glucose, Bld: 122 mg/dL — ABNORMAL HIGH (ref 70–99)
Potassium: 3.8 mEq/L (ref 3.7–5.3)
Sodium: 139 mEq/L (ref 137–147)

## 2014-03-17 LAB — GLUCOSE, CAPILLARY: Glucose-Capillary: 192 mg/dL — ABNORMAL HIGH (ref 70–99)

## 2014-03-17 LAB — TROPONIN I: Troponin I: <0.3 ng/mL (ref <0.30)

## 2014-03-17 MED ORDER — MECLIZINE HCL 25 MG PO TABS
25.0000 mg | ORAL_TABLET | Freq: Three times a day (TID) | ORAL | Status: DC | PRN
  Filled 2014-03-17: qty 1

## 2014-03-17 MED ORDER — COLCHICINE 0.6 MG PO TABS
0.6000 mg | ORAL_TABLET | Freq: Every day | ORAL | Status: DC
  Administered 2014-03-18 – 2014-03-20 (×3): 0.6 mg via ORAL
  Filled 2014-03-17 (×3): qty 1

## 2014-03-17 MED ORDER — SPIRONOLACTONE 25 MG PO TABS
25.0000 mg | ORAL_TABLET | Freq: Every day | ORAL | Status: DC
  Administered 2014-03-18 – 2014-03-20 (×3): 25 mg via ORAL
  Filled 2014-03-17 (×3): qty 1

## 2014-03-17 MED ORDER — LORATADINE 10 MG PO TABS
10.0000 mg | ORAL_TABLET | Freq: Every day | ORAL | Status: DC
  Administered 2014-03-18 – 2014-03-20 (×3): 10 mg via ORAL
  Filled 2014-03-17 (×3): qty 1

## 2014-03-17 MED ORDER — HYDROCORTISONE 2.5 % RE CREA
1.0000 "application " | TOPICAL_CREAM | Freq: Two times a day (BID) | RECTAL | Status: DC | PRN
  Filled 2014-03-17: qty 28.35

## 2014-03-17 MED ORDER — BRIMONIDINE TARTRATE 0.15 % OP SOLN
1.0000 [drp] | Freq: Three times a day (TID) | OPHTHALMIC | Status: DC
Start: 2014-03-17 — End: 2014-03-17
  Filled 2014-03-17: qty 5

## 2014-03-17 MED ORDER — FISH OIL 1000 MG PO CAPS: 1000.0000 mg | ORAL_CAPSULE | Freq: Every day | ORAL | Status: DC

## 2014-03-17 MED ORDER — LATANOPROST 0.005 % OP SOLN
1.0000 [drp] | Freq: Every day | OPHTHALMIC | Status: DC
  Administered 2014-03-17 – 2014-03-19 (×3): 1 [drp] via OPHTHALMIC
  Filled 2014-03-17 (×2): qty 2.5

## 2014-03-17 MED ORDER — ACETAMINOPHEN 325 MG PO TABS: 650.0000 mg | ORAL_TABLET | ORAL | Status: DC | PRN

## 2014-03-17 MED ORDER — IRBESARTAN 300 MG PO TABS
300.0000 mg | ORAL_TABLET | Freq: Every day | ORAL | Status: DC
  Administered 2014-03-18 – 2014-03-20 (×3): 300 mg via ORAL
  Filled 2014-03-17 (×3): qty 1

## 2014-03-17 MED ORDER — NITROGLYCERIN 0.4 MG/SPRAY TL SOLN
1.0000 | Status: DC | PRN
  Filled 2014-03-17: qty 4.9

## 2014-03-17 MED ORDER — HYDROCORTISONE 1 % EX CREA
1.0000 "application " | TOPICAL_CREAM | Freq: Two times a day (BID) | CUTANEOUS | Status: DC | PRN
  Filled 2014-03-17: qty 28

## 2014-03-17 MED ORDER — FERROUS SULFATE 325 (65 FE) MG PO TABS
325.0000 mg | ORAL_TABLET | Freq: Every day | ORAL | Status: DC
  Administered 2014-03-18 – 2014-03-20 (×3): 325 mg via ORAL
  Filled 2014-03-17 (×4): qty 1

## 2014-03-17 MED ORDER — OMEGA-3-ACID ETHYL ESTERS 1 G PO CAPS
1.0000 g | ORAL_CAPSULE | Freq: Every day | ORAL | Status: DC
  Administered 2014-03-18 – 2014-03-20 (×3): 1 g via ORAL
  Filled 2014-03-17 (×4): qty 1

## 2014-03-17 MED ORDER — BRIMONIDINE TARTRATE 0.2 % OP SOLN
1.0000 [drp] | Freq: Three times a day (TID) | OPHTHALMIC | Status: DC
  Administered 2014-03-17 – 2014-03-20 (×9): 1 [drp] via OPHTHALMIC
  Filled 2014-03-17: qty 5

## 2014-03-17 MED ORDER — WARFARIN SODIUM 2 MG PO TABS
2.0000 mg | ORAL_TABLET | Freq: Once | ORAL | Status: AC
  Administered 2014-03-17: 2 mg via ORAL
  Filled 2014-03-17: qty 1

## 2014-03-17 MED ORDER — WARFARIN - PHARMACIST DOSING INPATIENT: Freq: Every day | Status: DC

## 2014-03-17 MED ORDER — FAMOTIDINE 20 MG PO TABS
20.0000 mg | ORAL_TABLET | Freq: Two times a day (BID) | ORAL | Status: DC
  Administered 2014-03-17 – 2014-03-20 (×6): 20 mg via ORAL
  Filled 2014-03-17 (×8): qty 1

## 2014-03-17 MED ORDER — ATORVASTATIN CALCIUM 40 MG PO TABS
40.0000 mg | ORAL_TABLET | Freq: Every day | ORAL | Status: DC
  Administered 2014-03-18 – 2014-03-20 (×3): 40 mg via ORAL
  Filled 2014-03-17 (×3): qty 1

## 2014-03-17 MED ORDER — ONDANSETRON HCL 4 MG/2ML IJ SOLN: 4.0000 mg | Freq: Three times a day (TID) | INTRAMUSCULAR | Status: AC | PRN

## 2014-03-17 MED ORDER — ASPIRIN 81 MG PO CHEW
81.0000 mg | CHEWABLE_TABLET | Freq: Every day | ORAL | Status: DC
  Filled 2014-03-17: qty 1

## 2014-03-17 MED ORDER — TAMSULOSIN HCL 0.4 MG PO CAPS
0.4000 mg | ORAL_CAPSULE | Freq: Every day | ORAL | Status: DC
  Administered 2014-03-18 – 2014-03-20 (×3): 0.4 mg via ORAL
  Filled 2014-03-17 (×3): qty 1

## 2014-03-17 NOTE — ED Provider Notes (Signed)
CSN: [id number]     Arrival date & time 03/17/14  50 History   First MD Initiated Contact with Patient 03/17/14 1754     Chief Complaint  Patient presents with  . Dizziness      HPI Patient presents with three-day history of unsteady gait without vertigo.  Denies chest pain or shortness of breath.  Contact his primary care Dr. who spoke with the cardiologist who recommended he come to the emergency department.  Patient has a history of vertigo in the past but he says this does not feel anything like that. Past Medical History  Diagnosis Date  . Hypertension   . Stroke   . Syncope and collapse   . Hyperlipidemia   . Coronary artery disease   . Atrial fibrillation   . V-tach   . Bradycardia   . Alcoholic cardiomyopathy   . Hemorrhage of rectum and anus   . Cough   . Pulmonary eosinophilia   . Unspecified glaucoma   . GERD (gastroesophageal reflux disease)   . Long term (current) use of anticoagulants    Past Surgical History  Procedure Laterality Date  . Loop recorder  09/2007  . Knee surgery     Family History  Problem Relation Age of Onset  . Heart attack Neg Hx   . Stroke Neg Hx   . Colon cancer Mother   . Prostate cancer Brother   . Heart disease Father    History  Substance Use Topics  . Smoking status: Former Smoker    Quit date: 08/22/1983  . Smokeless tobacco: Not on file  . Alcohol Use: No    Review of Systems  All other systems reviewed and are negative  Allergies  Review of patient's allergies indicates no known allergies.  Home Medications   Prior to Admission medications   Medication Sig Start Date End Date Taking? Authorizing Provider  aspirin 81 MG chewable tablet Chew 81 mg by mouth daily.   Yes Historical Provider, MD  atorvastatin (LIPITOR) 40 MG tablet Take 40 mg by mouth daily at 6 PM.   Yes Historical Provider, MD  bimatoprost (LUMIGAN) 0.03 % ophthalmic solution Place 1 drop into both eyes at bedtime.    Yes Historical Provider, MD   brimonidine (ALPHAGAN P) 0.1 % SOLN Place 1 drop into the right eye 2 (two) times daily.    Yes Historical Provider, MD  colchicine 0.6 MG tablet Take 0.6 mg by mouth daily.    Yes Historical Provider, MD  famotidine (PEPCID) 20 MG tablet Take 20 mg by mouth 2 (two) times daily.   Yes Historical Provider, MD  ferrous sulfate 325 (65 FE) MG tablet Take 325 mg by mouth daily with breakfast.   Yes Historical Provider, MD  hydrocortisone (ANUSOL-HC) 2.5 % rectal cream Place 1 application rectally 2 (two) times daily as needed for hemorrhoids or itching.   Yes Historical Provider, MD  hydrocortisone cream 1 % Apply 1 application topically 2 (two) times daily as needed (for eczmea).   Yes Historical Provider, MD  irbesartan (AVAPRO) 300 MG tablet Take 300 mg by mouth daily.   Yes Historical Provider, MD  loratadine (CLARITIN) 10 MG tablet Take 10 mg by mouth daily.     Yes Historical Provider, MD  meclizine (ANTIVERT) 25 MG tablet Take 25 mg by mouth 3 (three) times daily as needed for dizziness.   Yes Historical Provider, MD  nitroGLYCERIN (NITROLINGUAL) 0.4 MG/SPRAY spray Place 1 spray under the tongue every 5 (five)  minutes x 3 doses as needed for chest pain.   Yes Historical Provider, MD  Omega-3 Fatty Acids (FISH OIL) 1000 MG CAPS Take 1,000 mg by mouth daily.   Yes Historical Provider, MD  spironolactone (ALDACTONE) 25 MG tablet Take 25 mg by mouth daily.   Yes Historical Provider, MD  Tamsulosin HCl (FLOMAX) 0.4 MG CAPS Take 0.4 mg by mouth daily. 12/28/11  Yes Historical Provider, MD  warfarin (COUMADIN) 2 MG tablet Take 2-4 mg by mouth daily. *takes 4mg  every day except for 2mg  on mondays and fridays*   Yes Historical Provider, MD   BP 173/73  Pulse 41  Temp(Src) 98.1 F (36.7 C) (Oral)  Resp 21  SpO2 100% Physical Exam Physical Exam  Nursing note and vitals reviewed. Constitutional: He is oriented to person, place, and time. He appears well-developed and well-nourished. No distress.   HENT:  Head: Normocephalic and atraumatic.  Eyes: Pupils are equal, round, and reactive to light.  Neck: Normal range of motion.  Cardiovascular: Normal rate and intact distal pulses.   Pulmonary/Chest: No respiratory distress.  Abdominal: Normal appearance. He exhibits no distension.  Musculoskeletal: Normal range of motion.  Neurological: He is alert and oriented to person, place, and time. No cranial nerve deficit.  his gait is normal.  He has no cerebellar findings.  Has no nystagmus.    Skin: Skin is warm and dry. No rash noted.  Psychiatric: He has a normal mood and affect. His behavior is normal.   ED Course  Procedures (including critical care time) Labs Review Labs Reviewed  CBC - Abnormal; Notable for the following:    Hemoglobin 11.4 (*)    HCT 35.8 (*)    RDW 15.7 (*)    All other components within normal limits  BASIC METABOLIC PANEL - Abnormal; Notable for the following:    Glucose, Bld 122 (*)    GFR calc non Af Amer 82 (*)    All other components within normal limits  PROTIME-INR - Abnormal; Notable for the following:    Prothrombin Time 25.5 (*)    INR 2.32 (*)    All other components within normal limits  I-STAT TROPOININ, ED    Imaging Review Ct Head Wo Contrast  03/17/2014   CLINICAL DATA:  Dizziness. Unsteady gait. Symptoms for the past 3 days.  EXAM: CT HEAD WITHOUT CONTRAST  TECHNIQUE: Contiguous axial images were obtained from the base of the skull through the vertex without intravenous contrast.  COMPARISON:  Head CT 04/15/2008.  FINDINGS: Again noted is a large area of low attenuation in the right MCA distribution, compatible with encephalomalacia from prior infarction. No acute intracranial abnormalities. Specifically, no evidence of acute intracranial hemorrhage, no definite findings of acute/subacute cerebral ischemia, no mass, mass effect, hydrocephalus or abnormal intra or extra-axial fluid collections. Visualized paranasal sinuses and mastoids are  well pneumatized, with exception of some mild multifocal mucosal thickening in the frontal, maxillary and ethmoid sinuses bilaterally. No acute displaced skull fractures are identified.  IMPRESSION: 1. No acute intracranial abnormalities. 2. 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   Electronically Signed   By: Vinnie Langton M.D.   On: 03/17/2014 18:49     EKG Interpretation   Date/Time:  Monday March 17 2014 15:58:33 EDT Ventricular Rate:  60 PR Interval:  164 QRS Duration: 110 QT Interval:  398 QTC Calculation: 398 R Axis:   33 Text Interpretation:  Normal  sinus rhythm Incomplete left bundle branch  block Nonspecific T wave abnormality Abnormal ECG Confirmed by Khori Underberg  MD,  Rayen Palen ([id number]) on 03/17/2014 7:15:25 PM     I discussed the case with cardiology who recommended patient be admitted to a monitored floor. MDM   Final diagnoses:  Dizziness  Bradycardia        Dot Lanes, MD 03/17/14 2051

## 2014-03-17 NOTE — ED Notes (Signed)
Pt brought back to room with family in tow; pt undressed, in gown, on monitor, continuous pulse oximetry and blood pressure cuff; family at bedside; Yvetta Coder, NT aware of pt

## 2014-03-17 NOTE — Progress Notes (Signed)
ANTICOAGULATION CONSULT NOTE - Initial Consult  Pharmacy Consult for coumadin  Indication: atrial fibrillation  No Known Allergies  Patient Measurements:   Heparin Dosing Weight:   Vital Signs: Temp: 98.2 F (36.8 C) (07/20 2052) Temp src: Oral (07/20 1559) BP: 171/83 mmHg (07/20 2100) Pulse Rate: 45 (07/20 2100)  Labs:  Recent Labs  03/17/14 1604  HGB 11.4*  HCT 35.8*  PLT 224  LABPROT 25.5*  INR 2.32*  CREATININE 0.86    The CrCl is unknown because both a height and weight (above a minimum accepted value) are required for this calculation.   Medical History: Past Medical History  Diagnosis Date  . Hypertension   . Stroke   . Syncope and collapse   . Hyperlipidemia   . Coronary artery disease   . Atrial fibrillation   . V-tach   . Bradycardia   . Alcoholic cardiomyopathy   . Hemorrhage of rectum and anus   . Cough   . Pulmonary eosinophilia   . Unspecified glaucoma   . GERD (gastroesophageal reflux disease)   . Long term (current) use of anticoagulants     Medications:  Prescriptions prior to admission  Medication Sig Dispense Refill  . aspirin 81 MG chewable tablet Chew 81 mg by mouth daily.      Marland Kitchen atorvastatin (LIPITOR) 40 MG tablet Take 40 mg by mouth daily at 6 PM.      . bimatoprost (LUMIGAN) 0.03 % ophthalmic solution Place 1 drop into both eyes at bedtime.       . brimonidine (ALPHAGAN P) 0.1 % SOLN Place 1 drop into the right eye 2 (two) times daily.       . colchicine 0.6 MG tablet Take 0.6 mg by mouth daily.       . famotidine (PEPCID) 20 MG tablet Take 20 mg by mouth 2 (two) times daily.      . ferrous sulfate 325 (65 FE) MG tablet Take 325 mg by mouth daily with breakfast.      . hydrocortisone (ANUSOL-HC) 2.5 % rectal cream Place 1 application rectally 2 (two) times daily as needed for hemorrhoids or itching.      . hydrocortisone cream 1 % Apply 1 application topically 2 (two) times daily as needed (for eczmea).      . irbesartan  (AVAPRO) 300 MG tablet Take 300 mg by mouth daily.      Marland Kitchen loratadine (CLARITIN) 10 MG tablet Take 10 mg by mouth daily.        . meclizine (ANTIVERT) 25 MG tablet Take 25 mg by mouth 3 (three) times daily as needed for dizziness.      . nitroGLYCERIN (NITROLINGUAL) 0.4 MG/SPRAY spray Place 1 spray under the tongue every 5 (five) minutes x 3 doses as needed for chest pain.      . Omega-3 Fatty Acids (FISH OIL) 1000 MG CAPS Take 1,000 mg by mouth daily.      Marland Kitchen spironolactone (ALDACTONE) 25 MG tablet Take 25 mg by mouth daily.      . Tamsulosin HCl (FLOMAX) 0.4 MG CAPS Take 0.4 mg by mouth daily.      Marland Kitchen warfarin (COUMADIN) 2 MG tablet Take 2-4 mg by mouth daily. *takes 4mg  every day except for 2mg  on mondays and fridays*       Scheduled:  . aspirin  81 mg Oral Daily  . [START ON 03/18/2014] atorvastatin  40 mg Oral q1800  . brimonidine  1 drop Both Eyes TID  .  colchicine  0.6 mg Oral Daily  . famotidine  20 mg Oral BID  . [START ON 03/18/2014] ferrous sulfate  325 mg Oral Q breakfast  . Fish Oil  1,000 mg Oral Daily  . irbesartan  300 mg Oral Daily  . latanoprost  1 drop Both Eyes QHS  . loratadine  10 mg Oral Daily  . spironolactone  25 mg Oral Daily  . tamsulosin  0.4 mg Oral Daily   Infusions:    Assessment: 77 yo who was admitted for vertigo. He has been on coumadin for afib. INR on admission is therapeutic. Coumadin has been ordered to cont here.   PTA - 4mg  qday except 2mg  MF  Goal of Therapy:  INR 2-3 Monitor platelets by anticoagulation protocol: Yes   Plan:   Coumadin 2mg  PO x1 Daily INR for now

## 2014-03-17 NOTE — ED Notes (Signed)
Pt c/o dizziness and unsteady gait x 3 days; pt sts hx of same in past; pt denies CP or SOB

## 2014-03-17 NOTE — H&P (Signed)
[facility] History and Physical  Vincent [name] DUK:[id number] DOB: October 27, 1935 DOA: 03/17/2014  Referring physician: ER physician. PCP: [name], MD   Chief Complaint: Dizziness.  HPI: Vincent [name] is a 78 y.o. male with history of alcoholic cardiomyopathy, atrial fibrillation on Coumadin, CVA, hyperlipidemia and gout has been experiencing dizziness over the last 3 days. Patient states he feels dizzy when he tries to walk. Denies any headache or visual symptoms or any loss of function of upper extremities. Patient had gone to his PCP today and his PCP contacted cardiologist and was advised to come to the ER. In the ER patient was found to be nonfocal but on making patient walked patient is feeling intensely dizzy. CT head was negative for anything acute. Patient's EKG shows normal sinus rhythm with nonspecific diffuse T-wave changes. Patient's monitor shows sinus bradycardia with heart rates going down to the 40s. On-call cardiologist was consulted by the ED physician at this time they have recommended admission and further observation. Patient denies having taken any new medications recently. Patient's chart states that patient has history of chronic bradycardia. Has had previous Loop recorder placed.   Review of Systems: As presented in the history of presenting illness, rest negative.  Past Medical History  Diagnosis Date  . Hypertension   . Stroke   . Syncope and collapse   . Hyperlipidemia   . Coronary artery disease   . Atrial fibrillation   . V-tach   . Bradycardia   . Alcoholic cardiomyopathy   . Hemorrhage of rectum and anus   . Cough   . Pulmonary eosinophilia   . Unspecified glaucoma   . GERD (gastroesophageal reflux disease)   . Long term (current) use of anticoagulants    Past Surgical History  Procedure Laterality Date  . Loop recorder  09/2007  . Knee surgery     Social History:  reports that he quit smoking about 30 years ago. He does not have  any smokeless tobacco history on file. He reports that he does not drink alcohol or use illicit drugs. Where does patient live home. Can patient participate in ADLs? Yes.  No Known Allergies  Family History:  Family History  Problem Relation Age of Onset  . Heart attack Neg Hx   . Stroke Neg Hx   . Colon cancer Mother   . Prostate cancer Brother   . Heart disease Father       Prior to Admission medications   Medication Sig Start Date End Date Taking? Authorizing Provider  aspirin 81 MG chewable tablet Chew 81 mg by mouth daily.   Yes Historical Provider, MD  atorvastatin (LIPITOR) 40 MG tablet Take 40 mg by mouth daily at 6 PM.   Yes Historical Provider, MD  bimatoprost (LUMIGAN) 0.03 % ophthalmic solution Place 1 drop into both eyes at bedtime.    Yes Historical Provider, MD  brimonidine (ALPHAGAN P) 0.1 % SOLN Place 1 drop into the right eye 2 (two) times daily.    Yes Historical Provider, MD  colchicine 0.6 MG tablet Take 0.6 mg by mouth daily.    Yes Historical Provider, MD  famotidine (PEPCID) 20 MG tablet Take 20 mg by mouth 2 (two) times daily.   Yes Historical Provider, MD  ferrous sulfate 325 (65 FE) MG tablet Take 325 mg by mouth daily with breakfast.   Yes Historical Provider, MD  hydrocortisone (ANUSOL-HC) 2.5 % rectal cream Place 1 application rectally 2 (two) times daily as needed for hemorrhoids  or itching.   Yes Historical Provider, MD  hydrocortisone cream 1 % Apply 1 application topically 2 (two) times daily as needed (for eczmea).   Yes Historical Provider, MD  irbesartan (AVAPRO) 300 MG tablet Take 300 mg by mouth daily.   Yes Historical Provider, MD  loratadine (CLARITIN) 10 MG tablet Take 10 mg by mouth daily.     Yes Historical Provider, MD  meclizine (ANTIVERT) 25 MG tablet Take 25 mg by mouth 3 (three) times daily as needed for dizziness.   Yes Historical Provider, MD  nitroGLYCERIN (NITROLINGUAL) 0.4 MG/SPRAY spray Place 1 spray under the tongue every 5 (five)  minutes x 3 doses as needed for chest pain.   Yes Historical Provider, MD  Omega-3 Fatty Acids (FISH OIL) 1000 MG CAPS Take 1,000 mg by mouth daily.   Yes Historical Provider, MD  spironolactone (ALDACTONE) 25 MG tablet Take 25 mg by mouth daily.   Yes Historical Provider, MD  Tamsulosin HCl (FLOMAX) 0.4 MG CAPS Take 0.4 mg by mouth daily. 12/28/11  Yes Historical Provider, MD  warfarin (COUMADIN) 2 MG tablet Take 2-4 mg by mouth daily. *takes 4mg  every day except for 2mg  on mondays and fridays*   Yes Historical Provider, MD    Physical Exam: Filed Vitals:   03/17/14 2000 03/17/14 2030 03/17/14 2052 03/17/14 2100  BP: 138/87 173/73  171/83  Pulse: 42 41  45  Temp:   98.2 F (36.8 C)   TempSrc:      Resp: 16 21  21   SpO2: 100% 100%  100%     General:  Well-developed and nourished.  Eyes: Anicteric no pallor.  ENT: No discharge from the ears eyes nose mouth.  Neck: No mass felt.  Cardiovascular: S1-S2 heard.  Respiratory: No rhonchi or crepitations.  Abdomen: Soft nontender bowel sounds present. No guarding rigidity.  Skin: No rash.  Musculoskeletal: No edema.  Psychiatric: Appears normal.  Neurologic: Alert awake oriented to time place and person. Moves all extremities 5 x 5. No facial symmetry. Tongue is midline. PERRLA positive.  Labs on Admission:  Basic Metabolic Panel:  Recent Labs Lab 03/17/14 1604  NA 139  K 3.8  CL 100  CO2 24  GLUCOSE 122*  BUN 7  CREATININE 0.86  CALCIUM 8.7   Liver Function Tests: No results found for this basename: AST, ALT, ALKPHOS, BILITOT, PROT, ALBUMIN,  in the last 168 hours No results found for this basename: LIPASE, AMYLASE,  in the last 168 hours No results found for this basename: AMMONIA,  in the last 168 hours CBC:  Recent Labs Lab 03/17/14 1604  WBC 5.4  HGB 11.4*  HCT 35.8*  MCV 83.4  PLT 224   Cardiac Enzymes: No results found for this basename: CKTOTAL, CKMB, CKMBINDEX, TROPONINI,  in the last 168  hours  BNP (last 3 results) No results found for this basename: PROBNP,  in the last 8760 hours CBG: No results found for this basename: GLUCAP,  in the last 168 hours  Radiological Exams on Admission: Ct Head Wo Contrast  03/17/2014   CLINICAL DATA:  Dizziness. Unsteady gait. Symptoms for the past 3 days.  EXAM: CT HEAD WITHOUT CONTRAST  TECHNIQUE: Contiguous axial images were obtained from the base of the skull through the vertex without intravenous contrast.  COMPARISON:  Head CT 04/15/2008.  FINDINGS: Again noted is a large area of low attenuation in the right MCA distribution, compatible with encephalomalacia from prior infarction. No acute intracranial abnormalities. Specifically, no  evidence of acute intracranial hemorrhage, no definite findings of acute/subacute cerebral ischemia, no mass, mass effect, hydrocephalus or abnormal intra or extra-axial fluid collections. Visualized paranasal sinuses and mastoids are well pneumatized, with exception of some mild multifocal mucosal thickening in the frontal, maxillary and ethmoid sinuses bilaterally. No acute displaced skull fractures are identified.  IMPRESSION: 1. No acute intracranial abnormalities. 2. 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   Electronically Signed   By: Vinnie Langton M.D.   On: 03/17/2014 18:49    EKG: Independently reviewed. Normal sinus rhythm with incomplete LBBB and diffuse T-wave changes.  Assessment/Plan Principal Problem:   Dizziness Active Problems:   Alcoholic cardiomyopathy   PAROXYSMAL ATRIAL FIBRILLATION   BRADYCARDIA   Near syncope   1. Near-syncope - patient's symptoms are mostly when he tries to stand up. Monitor shows bradycardia. Closely monitor in telemetry. Since patient has atrial fibrillation we will check MRI brain (patient has loop recorder and has had MRI heart previously). Check orthostatics in a.m.  Since patient is bradycardic check TSH. Patient is not on any rate limiting medications. Check 2-D echo. PT consult. 2. Paroxysmal atrial fibrillation - Coumadin per pharmacy. Closely monitor in telemetry due to #1 and bradycardia. 3. History of alcoholic cardiomyopathy - patient has had alcohol for many years. Continue diuretics. Closely follow intake output. 4. Chronic anemia - follow CBC. 5. Hyperlipidemia - on statins. 6. CAD - denies chest pain. 7. Gout - continue present medication.    Code Status: Full code.  Family Communication: Family at the bedside.  Disposition Plan: Admit for observation.    Cherie Lasalle N. [facility] Pager [phone].  If 7PM-7AM, please contact night-coverage [email or web address] Password [facility] 03/17/2014, 9:36 PM

## 2014-03-18 ENCOUNTER — Encounter (HOSPITAL_COMMUNITY): Payer: Self-pay | Admitting: Cardiology

## 2014-03-18 DIAGNOSIS — R42 Dizziness and giddiness: Principal | ICD-10-CM

## 2014-03-18 DIAGNOSIS — I369 Nonrheumatic tricuspid valve disorder, unspecified: Secondary | ICD-10-CM

## 2014-03-18 DIAGNOSIS — I498 Other specified cardiac arrhythmias: Secondary | ICD-10-CM

## 2014-03-18 LAB — CBC WITH DIFFERENTIAL/PLATELET
Basophils Absolute: 0 10*3/uL (ref 0.0–0.1)
Basophils Relative: 1 % (ref 0–1)
Eosinophils Absolute: 0.3 10*3/uL (ref 0.0–0.7)
Eosinophils Relative: 5 % (ref 0–5)
HCT: 36.9 % — ABNORMAL LOW (ref 39.0–52.0)
Hemoglobin: 11.5 g/dL — ABNORMAL LOW (ref 13.0–17.0)
Lymphocytes Relative: 28 % (ref 12–46)
Lymphs Abs: 1.3 10*3/uL (ref 0.7–4.0)
MCH: 26.7 pg (ref 26.0–34.0)
MCHC: 31.2 g/dL (ref 30.0–36.0)
MCV: 85.8 fL (ref 78.0–100.0)
Monocytes Absolute: 0.5 10*3/uL (ref 0.1–1.0)
Monocytes Relative: 11 % (ref 3–12)
Neutro Abs: 2.7 10*3/uL (ref 1.7–7.7)
Neutrophils Relative %: 55 % (ref 43–77)
Platelets: 217 10*3/uL (ref 150–400)
RBC: 4.3 MIL/uL (ref 4.22–5.81)
RDW: 16 % — ABNORMAL HIGH (ref 11.5–15.5)
WBC: 4.8 10*3/uL (ref 4.0–10.5)

## 2014-03-18 LAB — PROTIME-INR
INR: 1.93 — ABNORMAL HIGH (ref 0.00–1.49)
Prothrombin Time: 22.1 seconds — ABNORMAL HIGH (ref 11.6–15.2)

## 2014-03-18 LAB — COMPREHENSIVE METABOLIC PANEL
ALT: 21 U/L (ref 0–53)
AST: 22 U/L (ref 0–37)
Albumin: 3.5 g/dL (ref 3.5–5.2)
Alkaline Phosphatase: 76 U/L (ref 39–117)
Anion gap: 10 (ref 5–15)
BUN: 8 mg/dL (ref 6–23)
CO2: 26 mEq/L (ref 19–32)
Calcium: 8.7 mg/dL (ref 8.4–10.5)
Chloride: 103 mEq/L (ref 96–112)
Creatinine, Ser: 0.98 mg/dL (ref 0.50–1.35)
GFR calc Af Amer: >90 mL/min (ref >90)
GFR calc non Af Amer: 78 mL/min — ABNORMAL LOW (ref >90)
Glucose, Bld: 122 mg/dL — ABNORMAL HIGH (ref 70–99)
Potassium: 4.2 mEq/L (ref 3.7–5.3)
Sodium: 139 mEq/L (ref 137–147)
Total Bilirubin: 0.8 mg/dL (ref 0.3–1.2)
Total Protein: 7 g/dL (ref 6.0–8.3)

## 2014-03-18 LAB — GLUCOSE, CAPILLARY
Glucose-Capillary: 133 mg/dL — ABNORMAL HIGH (ref 70–99)
Glucose-Capillary: 123 mg/dL — ABNORMAL HIGH (ref 70–99)
Glucose-Capillary: 106 mg/dL — ABNORMAL HIGH (ref 70–99)
Glucose-Capillary: 117 mg/dL — ABNORMAL HIGH (ref 70–99)

## 2014-03-18 MED ORDER — WARFARIN SODIUM 4 MG PO TABS
4.0000 mg | ORAL_TABLET | Freq: Once | ORAL | Status: AC
  Administered 2014-03-18: 4 mg via ORAL
  Filled 2014-03-18 (×2): qty 1

## 2014-03-18 NOTE — Evaluation (Addendum)
Physical Therapy Evaluation and D/C Patient Details Name: LOYS SHUGARS MRN: [id number] DOB: 20-Jan-1936 Today's Date: 03/18/2014   History of Present Illness  Pt admit with dizziness.    Clinical Impression  Pt admitted with above. Orthostatic BP's below.  Pt asymptomatic during entire treatment today.   Pt currently without significant functional limitations and is functioning at baseline per pt needing only supervision and no physical assist during evaluation.  Pt will not need skilled PT at this time.  Will sign off.      Follow Up Recommendations No PT follow up    Equipment Recommendations  None recommended by PT    Recommendations for Other Services       Precautions / Restrictions Precautions Precautions: Fall Restrictions Weight Bearing Restrictions: No      Mobility  Bed Mobility Overal bed mobility: Independent Bed Mobility: Supine to Sit     Supine to sit: Independent Did a vestibular assessment that was negative.        Transfers Overall transfer level: Independent                  Ambulation/Gait Ambulation/Gait assistance: Supervision Ambulation Distance (Feet): 350 Feet Assistive device: None Gait Pattern/deviations: Decreased stride length;Step-through pattern;Drifts right/left   Gait velocity interpretation: <1.8 ft/sec, indicative of risk for recurrent falls General Gait Details: Pt can withstand min challenges to balance and maintain balance.  Pt states he is close to baseline. At times, pt veered to his right but able to self correct balance.    Stairs            Wheelchair Mobility    Modified [name] (Stroke Patients Only)       Balance                                 Standardized Balance Assessment Standardized Balance Assessment : Dynamic Gait Index   Dynamic Gait Index Level Surface: Normal Change in Gait Speed: Normal Gait with Horizontal Head Turns: Normal Gait with Vertical Head Turns:  Normal Gait and Pivot Turn: Mild Impairment Step Over Obstacle: Mild Impairment Step Around Obstacles: Normal Steps: Mild Impairment Total Score: 21       Pertinent Vitals/Pain Orthostatic BPs  Supine 156/69, 52 bpm  Sitting 144/65, 55 bpm  Standing 146/99, 70 bpm  Standing after 3 min 150/72, 70 bpm      Home Living Family/patient expects to be discharged to:: Private residence Living Arrangements: Alone Available Help at Discharge: Family;Available 24 hours/day (sister could assist if needed perpt) Type of Home: House Home Access: Stairs to enter Entrance Stairs-Rails: None Entrance Stairs-Number of Steps: 3 Home Layout: One level Home Equipment: Walker - 2 wheels;Cane - single point;Shower seat;Bedside commode      Prior Function Level of Independence: Independent               Hand Dominance        Extremity/Trunk Assessment   Upper Extremity Assessment: Defer to OT evaluation           Lower Extremity Assessment: Generalized weakness      Cervical / Trunk Assessment: Normal  Communication   Communication: No difficulties  Cognition Arousal/Alertness: Awake/alert Behavior During Therapy: WFL for tasks assessed/performed Overall Cognitive Status: Within Functional Limits for tasks assessed                      General Comments General comments (  skin integrity, edema, etc.): Scored 21/24 on DGI showing low fall risk.   Did note a slight tremor in pts left hand today at times.  Pt states that is not new.     Exercises        Assessment/Plan    PT Assessment Patent does not need any further PT services  PT Diagnosis     PT Problem List    PT Treatment Interventions     PT Goals (Current goals can be found in the Care Plan section) Acute Rehab PT Goals PT Goal Formulation: No goals set, d/c therapy    Frequency     Barriers to discharge        Co-evaluation               End of Session Equipment Utilized During  Treatment: Gait belt Activity Tolerance: Patient limited by fatigue Patient left: in bed;with call bell/phone within reach;with bed alarm set Nurse Communication: Mobility status    Functional Assessment Tool Used: clinical judgement Functional Limitation: Mobility: Walking and moving around Mobility: Walking and Moving Around Current Status ([id number]): At least 1 percent but less than 20 percent impaired, limited or restricted Mobility: Walking and Moving Around Goal Status [phone]): At least 1 percent but less than 20 percent impaired, limited or restricted Mobility: Walking and Moving Around Discharge Status [phone]): At least 1 percent but less than 20 percent impaired, limited or restricted    Time: 0900-0925 PT Time Calculation (min): 25 min   Charges:   PT Evaluation $Initial PT Evaluation Tier I: 1 Procedure PT Treatments $Gait Training: 8-22 mins   PT G Codes:   Functional Assessment Tool Used: clinical judgement Functional Limitation: Mobility: Walking and moving around    INGOLD,Lakyla Biswas 03/18/2014, 9:36 AM [facility] [phone] (pager)

## 2014-03-18 NOTE — Progress Notes (Signed)
ANTICOAGULATION CONSULT NOTE - Follow Up Consult  Pharmacy Consult for coumadin Indication: atrial fibrillation  No Known Allergies  Vital Signs: Temp: 97.7 F (36.5 C) (07/21 0815) Temp src: Oral (07/21 0815) BP: 128/106 mmHg (07/21 0815) Pulse Rate: 63 (07/21 0815)  Labs:  Recent Labs  03/17/14 1604 03/17/14 2220 03/18/14 0415  HGB 11.4*  --  11.5*  HCT 35.8*  --  36.9*  PLT 224  --  217  LABPROT 25.5*  --  22.1*  INR 2.32*  --  1.93*  CREATININE 0.86  --  0.98  TROPONINI  --  <0.30  --     Estimated Creatinine Clearance: 73.2 ml/min (by C-G formula based on Cr of 0.98).  Assessment: 78 yo M who was admitted for vertigo. He has been on coumadin PTA for Afib. INR on admission was therapeutic, but now slightly subtherapeutic at 1.93 on 7/21. Home coumadin dose = 4mg  daily exc 2mg  MF.  Goal of Therapy:  INR 2-3 Monitor platelets by anticoagulation protocol: Yes   Plan:  1. Coumadin 4mg  PO x1 2. Daily INR  Dejon Jungman J 03/18/2014,10:39 AM

## 2014-03-18 NOTE — Consult Note (Signed)
Primary cardiologist: Meda Coffee  HPI: 78 year old male for evaluation of dizziness and bradycardia. Patient has a history of alcoholic cardiomyopathy improved by most recent echocardiogram. Also history of coronary disease and paroxysmal atrial fibrillation. He has had previous PCI of his right coronary artery. Last catheterization in July of 2006 revealed nonobstructive coronary disease. Normal ejection fraction. Last echocardiogram in 2009 showed normal LV function. Patient has had problems with dizziness chronically. He had a previous implantable loop that did not show significant correlation of dizziness with bradycardia. His loop recorder is not functioning because of battery depletion. Patient occasionally has mild dyspnea on exertion but no orthopnea, PND, pedal edema. In the past month he has had occasional chest pain. It is substernal without radiation and no associated symptoms. Not exertional or related to food. Lasts 30 minutes and resolves. Over the preceding 3 days he has noticed occasional dizziness. This occurs both with standing and in the sitting position. He feels "lightheaded". No associated palpitations, chest pain, nausea, dizziness or syncope. He has been noted to have some bradycardia on telemetry and cardiology asked to evaluate.  Medications Prior to Admission  Medication Sig Dispense Refill  . aspirin 81 MG chewable tablet Chew 81 mg by mouth daily.      Marland Kitchen atorvastatin (LIPITOR) 40 MG tablet Take 40 mg by mouth daily at 6 PM.      . bimatoprost (LUMIGAN) 0.03 % ophthalmic solution Place 1 drop into both eyes at bedtime.       . brimonidine (ALPHAGAN P) 0.1 % SOLN Place 1 drop into the right eye 2 (two) times daily.       . colchicine 0.6 MG tablet Take 0.6 mg by mouth daily.       . famotidine (PEPCID) 20 MG tablet Take 20 mg by mouth 2 (two) times daily.      . ferrous sulfate 325 (65 FE) MG tablet Take 325 mg by mouth daily with breakfast.      . hydrocortisone  (ANUSOL-HC) 2.5 % rectal cream Place 1 application rectally 2 (two) times daily as needed for hemorrhoids or itching.      . hydrocortisone cream 1 % Apply 1 application topically 2 (two) times daily as needed (for eczmea).      . irbesartan (AVAPRO) 300 MG tablet Take 300 mg by mouth daily.      Marland Kitchen loratadine (CLARITIN) 10 MG tablet Take 10 mg by mouth daily.        . meclizine (ANTIVERT) 25 MG tablet Take 25 mg by mouth 3 (three) times daily as needed for dizziness.      . nitroGLYCERIN (NITROLINGUAL) 0.4 MG/SPRAY spray Place 1 spray under the tongue every 5 (five) minutes x 3 doses as needed for chest pain.      . Omega-3 Fatty Acids (FISH OIL) 1000 MG CAPS Take 1,000 mg by mouth daily.      Marland Kitchen spironolactone (ALDACTONE) 25 MG tablet Take 25 mg by mouth daily.      . Tamsulosin HCl (FLOMAX) 0.4 MG CAPS Take 0.4 mg by mouth daily.      Marland Kitchen warfarin (COUMADIN) 2 MG tablet Take 2-4 mg by mouth daily. *takes 67m every day except for 21mon mondays and fridays*        No Known Allergies  Past Medical History  Diagnosis Date  . Hypertension   . Stroke   . Syncope and collapse   . Hyperlipidemia   . Coronary artery disease   .  Atrial fibrillation   . V-tach   . Bradycardia   . Alcoholic cardiomyopathy   . Pulmonary eosinophilia   . Unspecified glaucoma   . GERD (gastroesophageal reflux disease)   . Long term (current) use of anticoagulants     Past Surgical History  Procedure Laterality Date  . Loop recorder  09/2007  . Knee surgery      History   Social History  . Marital Status: Divorced    Spouse Name: N/A    Number of Children: N/A  . Years of Education: N/A   Occupational History  . Not on file.   Social History Main Topics  . Smoking status: Former Smoker    Quit date: 08/22/1983  . Smokeless tobacco: Not on file  . Alcohol Use: No  . Drug Use: No  . Sexual Activity: Not on file   Other Topics Concern  . Not on file   Social History Narrative  . No narrative  on file    Family History  Problem Relation Age of Onset  . Heart attack Neg Hx   . Stroke Neg Hx   . Colon cancer Mother   . Prostate cancer Brother   . Heart disease Father     ROS:  no fevers or chills, productive cough, hemoptysis, dysphasia, odynophagia, melena, hematochezia, dysuria, hematuria, rash, seizure activity, orthopnea, PND, pedal edema, claudication. Remaining systems are negative.  Physical Exam:   Blood pressure 128/106, pulse 63, temperature 97.7 F (36.5 C), temperature source Oral, resp. rate 18, height 5' 9.5" (1.765 m), weight 207 lb 1.6 oz (93.94 kg), SpO2 98.00%.  General:  Well developed/well nourished in NAD Skin warm/dry Patient not depressed No peripheral clubbing Back-normal HEENT-normal/normal eyelids Neck supple/normal carotid upstroke bilaterally; no bruits; no JVD; no thyromegaly chest - CTA/ normal expansion CV - RRR/normal S1 and S2; no murmurs, rubs or gallops;  PMI nondisplaced Abdomen -NT/ND, no HSM, no mass, + bowel sounds, no bruit 2+ femoral pulses, no bruits Ext-no edema, chords, 2+ DP Neuro-grossly nonfocal  ECG Sinus rhythm with inferior lateral T-wave inversion   Results for orders placed during the hospital encounter of 03/17/14 (from the past 48 hour(s))  CBC     Status: Abnormal   Collection Time    03/17/14  4:04 PM      Result Value Ref Range   WBC 5.4  4.0 - 10.5 K/uL   RBC 4.29  4.22 - 5.81 MIL/uL   Hemoglobin 11.4 (*) 13.0 - 17.0 g/dL   HCT 35.8 (*) 39.0 - 52.0 %   MCV 83.4  78.0 - 100.0 fL   MCH 26.6  26.0 - 34.0 pg   MCHC 31.8  30.0 - 36.0 g/dL   RDW 15.7 (*) 11.5 - 15.5 %   Platelets 224  150 - 400 K/uL  BASIC METABOLIC PANEL     Status: Abnormal   Collection Time    03/17/14  4:04 PM      Result Value Ref Range   Sodium 139  137 - 147 mEq/L   Potassium 3.8  3.7 - 5.3 mEq/L   Chloride 100  96 - 112 mEq/L   CO2 24  19 - 32 mEq/L   Glucose, Bld 122 (*) 70 - 99 mg/dL   BUN 7  6 - 23 mg/dL   Creatinine,  Ser 0.86  0.50 - 1.35 mg/dL   Calcium 8.7  8.4 - 10.5 mg/dL   GFR calc non Af Amer 82 (*) >90 mL/min  GFR calc Af Amer >90  >90 mL/min   Comment: (NOTE)     The eGFR has been calculated using the CKD EPI equation.     This calculation has not been validated in all clinical situations.     eGFR's persistently <90 mL/min signify possible Chronic Kidney     Disease.   Anion gap 15  5 - 15  PROTIME-INR     Status: Abnormal   Collection Time    03/17/14  4:04 PM      Result Value Ref Range   Prothrombin Time 25.5 (*) 11.6 - 15.2 seconds   INR 2.32 (*) 0.00 - 1.49  I-STAT TROPOININ, ED     Status: None   Collection Time    03/17/14  4:14 PM      Result Value Ref Range   Troponin i, poc 0.00  0.00 - 0.08 ng/mL   Comment 3            Comment: Due to the release kinetics of cTnI,     a negative result within the first hours     of the onset of symptoms does not rule out     myocardial infarction with certainty.     If myocardial infarction is still suspected,     repeat the test at appropriate intervals.  GLUCOSE, CAPILLARY     Status: Abnormal   Collection Time    03/17/14  9:55 PM      Result Value Ref Range   Glucose-Capillary 192 (*) 70 - 99 mg/dL   Comment 1 Notify RN    URINE RAPID DRUG SCREEN (HOSP PERFORMED)     Status: None   Collection Time    03/17/14 10:07 PM      Result Value Ref Range   Opiates NONE DETECTED  NONE DETECTED   Cocaine NONE DETECTED  NONE DETECTED   Benzodiazepines NONE DETECTED  NONE DETECTED   Amphetamines NONE DETECTED  NONE DETECTED   Tetrahydrocannabinol NONE DETECTED  NONE DETECTED   Barbiturates NONE DETECTED  NONE DETECTED   Comment:            DRUG SCREEN FOR MEDICAL PURPOSES     ONLY.  IF CONFIRMATION IS NEEDED     FOR ANY PURPOSE, NOTIFY LAB     WITHIN 5 DAYS.                LOWEST DETECTABLE LIMITS     FOR URINE DRUG SCREEN     Drug Class       Cutoff (ng/mL)     Amphetamine      1000     Barbiturate      200     Benzodiazepine    101     Tricyclics       751     Opiates          300     Cocaine          300     THC              50  TSH     Status: None   Collection Time    03/17/14 10:20 PM      Result Value Ref Range   TSH 1.590  0.350 - 4.500 uIU/mL  TROPONIN I     Status: None   Collection Time    03/17/14 10:20 PM      Result Value Ref Range   Troponin I <0.30  <  0.30 ng/mL   Comment:            Due to the release kinetics of cTnI,     a negative result within the first hours     of the onset of symptoms does not rule out     myocardial infarction with certainty.     If myocardial infarction is still suspected,     repeat the test at appropriate intervals.  COMPREHENSIVE METABOLIC PANEL     Status: Abnormal   Collection Time    03/18/14  4:15 AM      Result Value Ref Range   Sodium 139  137 - 147 mEq/L   Potassium 4.2  3.7 - 5.3 mEq/L   Chloride 103  96 - 112 mEq/L   CO2 26  19 - 32 mEq/L   Glucose, Bld 122 (*) 70 - 99 mg/dL   BUN 8  6 - 23 mg/dL   Creatinine, Ser 0.98  0.50 - 1.35 mg/dL   Calcium 8.7  8.4 - 10.5 mg/dL   Total Protein 7.0  6.0 - 8.3 g/dL   Albumin 3.5  3.5 - 5.2 g/dL   AST 22  0 - 37 U/L   ALT 21  0 - 53 U/L   Alkaline Phosphatase 76  39 - 117 U/L   Total Bilirubin 0.8  0.3 - 1.2 mg/dL   GFR calc non Af Amer 78 (*) >90 mL/min   GFR calc Af Amer >90  >90 mL/min   Comment: (NOTE)     The eGFR has been calculated using the CKD EPI equation.     This calculation has not been validated in all clinical situations.     eGFR's persistently <90 mL/min signify possible Chronic Kidney     Disease.   Anion gap 10  5 - 15  CBC WITH DIFFERENTIAL     Status: Abnormal   Collection Time    03/18/14  4:15 AM      Result Value Ref Range   WBC 4.8  4.0 - 10.5 K/uL   RBC 4.30  4.22 - 5.81 MIL/uL   Hemoglobin 11.5 (*) 13.0 - 17.0 g/dL   HCT 36.9 (*) 39.0 - 52.0 %   MCV 85.8  78.0 - 100.0 fL   MCH 26.7  26.0 - 34.0 pg   MCHC 31.2  30.0 - 36.0 g/dL   RDW 16.0 (*) 11.5 - 15.5 %    Platelets 217  150 - 400 K/uL   Neutrophils Relative % 55  43 - 77 %   Neutro Abs 2.7  1.7 - 7.7 K/uL   Lymphocytes Relative 28  12 - 46 %   Lymphs Abs 1.3  0.7 - 4.0 K/uL   Monocytes Relative 11  3 - 12 %   Monocytes Absolute 0.5  0.1 - 1.0 K/uL   Eosinophils Relative 5  0 - 5 %   Eosinophils Absolute 0.3  0.0 - 0.7 K/uL   Basophils Relative 1  0 - 1 %   Basophils Absolute 0.0  0.0 - 0.1 K/uL  PROTIME-INR     Status: Abnormal   Collection Time    03/18/14  4:15 AM      Result Value Ref Range   Prothrombin Time 22.1 (*) 11.6 - 15.2 seconds   INR 1.93 (*) 0.00 - 1.49  GLUCOSE, CAPILLARY     Status: Abnormal   Collection Time    03/18/14  6:38 AM      Result  Value Ref Range   Glucose-Capillary 133 (*) 70 - 99 mg/dL   Comment 1 Notify RN      Ct Head Wo Contrast  03/17/2014   CLINICAL DATA:  Dizziness. Unsteady gait. Symptoms for the past 3 days.  EXAM: CT HEAD WITHOUT CONTRAST  TECHNIQUE: Contiguous axial images were obtained from the base of the skull through the vertex without intravenous contrast.  COMPARISON:  Head CT 04/15/2008.  FINDINGS: Again noted is a large area of low attenuation in the right MCA distribution, compatible with encephalomalacia from prior infarction. No acute intracranial abnormalities. Specifically, no evidence of acute intracranial hemorrhage, no definite findings of acute/subacute cerebral ischemia, no mass, mass effect, hydrocephalus or abnormal intra or extra-axial fluid collections. Visualized paranasal sinuses and mastoids are well pneumatized, with exception of some mild multifocal mucosal thickening in the frontal, maxillary and ethmoid sinuses bilaterally. No acute displaced skull fractures are identified.  IMPRESSION: 1. No acute intracranial abnormalities. 2. 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   Electronically Signed   By: Vinnie Langton M.D.   On:  03/17/2014 18:49   Dg Chest Port 1 View  03/17/2014   CLINICAL DATA:  Shortness of breath, dizziness, weakness.  EXAM: PORTABLE CHEST - 1 VIEW  COMPARISON:  12/16/2010  FINDINGS: Low lung volumes with bibasilar atelectasis and accentuation of the heart size. No effusions. No acute bony abnormality. Loop recorder device again noted.  IMPRESSION: Low lung volumes, bibasilar atelectasis.   Electronically Signed   By: Rolm Baptise M.D.   On: 03/17/2014 21:47    Assessment/Plan 1 Dizziness/bradycardia-there is no clear correlation of symptoms with bradycardia. He has had intermittent dizziness for years and previous implantable loop show no correlation of dizziness with abnormal rhythm. I have reviewed telemetry here and this shows sinus rhythm with occasional sinus bradycardia with rates in the 40s. Given no correlation with symptoms there is no indication for pacemaker. Would avoid AV nodal blocking agents. 2 history of cardiomyopathy-felt to be related to alcohol. This has now improved after discontinuing his alcohol use. Repeat echocardiogram pending. 3 paroxysmal atrial fibrillation-continue Coumadin. Discontinue aspirin given need for Coumadin. 4 history of coronary artery disease-continue statin. 5 hypertension-continue present medications and follow blood pressure. 6 atypical chest pain-patient describes atypical chest pain. Enzymes are negative. Would arrange outpatient exercise nuclear study following discharge. This whould exclude ischemia and also help evaluate chronotropic competence.  Kirk Ruths MD 03/18/2014, 10:30 AM

## 2014-03-18 NOTE — Progress Notes (Addendum)
[facility] PROGRESS NOTE  Vincent [name] ZDG:[id number] DOB: June 30, 1936 DOA: 03/17/2014 PCP: [name], MD  Brief Narrative: HPI: Vincent [name] is a 78/M with history of alcoholic cardiomyopathy, Afib on Coumadin, CVA, hyperlipidemia and gout had been experiencing dizziness over the last 3 days, especially when he tried to walk.  He went to his PCP 7/20 and his PCP contacted cardiologist and was advised to come to the ER.  CT head was negative for anything acute, EKG shows NSR with nonspecific diffuse T-wave changes, tele sinus bradycardia. Undergoing workup, Cards consulted  Assessment/Plan: 1. Dizziness/Near Syncope - etiology unclear - had long term symptoms for several years until 1 year back - Loop recorder 2009 -Orthostatics - MRI pending, 2D ECHO - Pt eval -tele with some sinus bradycardia  2. P.Afib -rate controlled, continue Coumadin  3. H/o alcoholic cardiomyopathy -stable, FU ECHO  4. CHronic anemia -defer to PCP  5. HTN -continue Avapro/aldactone  DVT proph: Coumadin  Code Status: Full Code Family Communication: none at bedside Disposition Plan: Home pending workup   Consultants:  Cards  HPI/Subjective: Still with some dizziness  Objective: Filed Vitals:   03/18/14 0815  BP: 128/106  Pulse: 63  Temp: 97.7 F (36.5 C)  Resp: 18    Intake/Output Summary (Last 24 hours) at 03/18/14 1222 Last data filed at 03/18/14 0700  Gross per 24 hour  Intake    360 ml  Output    350 ml  Net     10 ml   Filed Weights   03/17/14 2148  Weight: 93.94 kg (207 lb 1.6 oz)    Exam:   General:  AAOx3  Cardiovascular: S1S2/RRR  Respiratory: CTAB  Abdomen: soft, NT, BS present  Musculoskeletal: no edema c/c  Neuro: non focal   Data Reviewed: Basic Metabolic Panel:  Recent Labs Lab 03/17/14 1604 03/18/14 0415  NA 139 139  K 3.8 4.2  CL 100 103  CO2 24 26  GLUCOSE 122* 122*  BUN 7 8  CREATININE 0.86 0.98  CALCIUM 8.7  8.7   Liver Function Tests:  Recent Labs Lab 03/18/14 0415  AST 22  ALT 21  ALKPHOS 76  BILITOT 0.8  PROT 7.0  ALBUMIN 3.5   No results found for this basename: LIPASE, AMYLASE,  in the last 168 hours No results found for this basename: AMMONIA,  in the last 168 hours CBC:  Recent Labs Lab 03/17/14 1604 03/18/14 0415  WBC 5.4 4.8  NEUTROABS  --  2.7  HGB 11.4* 11.5*  HCT 35.8* 36.9*  MCV 83.4 85.8  PLT 224 217   Cardiac Enzymes:  Recent Labs Lab 03/17/14 2220  TROPONINI <0.30   BNP (last 3 results) No results found for this basename: PROBNP,  in the last 8760 hours CBG:  Recent Labs Lab 03/17/14 2155 03/18/14 0638 03/18/14 1124  GLUCAP 192* 133* 123*    No results found for this or any previous visit (from the past 240 hour(s)).   Studies: Ct Head Wo Contrast  03/17/2014   CLINICAL DATA:  Dizziness. Unsteady gait. Symptoms for the past 3 days.  EXAM: CT HEAD WITHOUT CONTRAST  TECHNIQUE: Contiguous axial images were obtained from the base of the skull through the vertex without intravenous contrast.  COMPARISON:  Head CT 04/15/2008.  FINDINGS: Again noted is a large area of low attenuation in the right MCA distribution, compatible with encephalomalacia from prior infarction. No acute intracranial abnormalities. Specifically, no evidence of acute intracranial hemorrhage, no definite  findings of acute/subacute cerebral ischemia, no mass, mass effect, hydrocephalus or abnormal intra or extra-axial fluid collections. Visualized paranasal sinuses and mastoids are well pneumatized, with exception of some mild multifocal mucosal thickening in the frontal, maxillary and ethmoid sinuses bilaterally. No acute displaced skull fractures are identified.  IMPRESSION: 1. No acute intracranial abnormalities. 2. 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   Electronically Signed   By: Vinnie Langton M.D.   On: 03/17/2014 18:49   Dg Chest Port 1 View  03/17/2014   CLINICAL DATA:  Shortness of breath, dizziness, weakness.  EXAM: PORTABLE CHEST - 1 VIEW  COMPARISON:  12/16/2010  FINDINGS: Low lung volumes with bibasilar atelectasis and accentuation of the heart size. No effusions. No acute bony abnormality. Loop recorder device again noted.  IMPRESSION: Low lung volumes, bibasilar atelectasis.   Electronically Signed   By: Rolm Baptise M.D.   On: 03/17/2014 21:47    Scheduled Meds: . atorvastatin  40 mg Oral q1800  . brimonidine  1 drop Both Eyes TID  . colchicine  0.6 mg Oral Daily  . famotidine  20 mg Oral BID  . ferrous sulfate  325 mg Oral Q breakfast  . irbesartan  300 mg Oral Daily  . latanoprost  1 drop Both Eyes QHS  . loratadine  10 mg Oral Daily  . omega-3 acid ethyl esters  1 g Oral Daily  . spironolactone  25 mg Oral Daily  . tamsulosin  0.4 mg Oral Daily  . warfarin  4 mg Oral ONCE-1800  . Warfarin - Pharmacist Dosing Inpatient   Does not apply q1800   Continuous Infusions:  Antibiotics Given (last 72 hours)   None      Principal Problem:   Dizziness Active Problems:   Alcoholic cardiomyopathy   PAROXYSMAL ATRIAL FIBRILLATION   BRADYCARDIA   Near syncope    Time spent: 87min    [facility] Pager [phone]. If 7PM-7AM, please contact night-coverage at [email or web address], password [facility] 03/18/2014, 12:22 PM  LOS: 1 day

## 2014-03-18 NOTE — Progress Notes (Signed)
Echo Lab  2D Echocardiogram completed.  [place], RDCS 03/18/2014 3:36 PM

## 2014-03-18 NOTE — Telephone Encounter (Signed)
LM on patient's VM to call me back to discuss medicine refill.

## 2014-03-19 ENCOUNTER — Observation Stay (HOSPITAL_COMMUNITY): Payer: Medicare Other

## 2014-03-19 ENCOUNTER — Telehealth: Payer: Self-pay | Admitting: Gastroenterology

## 2014-03-19 DIAGNOSIS — R072 Precordial pain: Secondary | ICD-10-CM | POA: Diagnosis present

## 2014-03-19 DIAGNOSIS — I2510 Atherosclerotic heart disease of native coronary artery without angina pectoris: Secondary | ICD-10-CM

## 2014-03-19 LAB — GLUCOSE, CAPILLARY
Glucose-Capillary: 126 mg/dL — ABNORMAL HIGH (ref 70–99)
Glucose-Capillary: 102 mg/dL — ABNORMAL HIGH (ref 70–99)
Glucose-Capillary: 107 mg/dL — ABNORMAL HIGH (ref 70–99)
Glucose-Capillary: 139 mg/dL — ABNORMAL HIGH (ref 70–99)

## 2014-03-19 LAB — PROTIME-INR
INR: 1.73 — ABNORMAL HIGH (ref 0.00–1.49)
Prothrombin Time: 20.3 seconds — ABNORMAL HIGH (ref 11.6–15.2)

## 2014-03-19 MED ORDER — WARFARIN SODIUM 4 MG PO TABS
4.0000 mg | ORAL_TABLET | Freq: Once | ORAL | Status: AC
  Administered 2014-03-19: 4 mg via ORAL
  Filled 2014-03-19: qty 1

## 2014-03-19 NOTE — Progress Notes (Signed)
[facility] PROGRESS NOTE  Vincent [name]:[id number] DOB: 1935/10/31 DOA: 03/17/2014 PCP: [name], MD  Brief Narrative: HPI: Vincent [name] is a 78/M with history of alcoholic cardiomyopathy, Afib on Coumadin, CVA, hyperlipidemia and gout had been experiencing dizziness over the last 3 days, especially when he tried to walk.  He went to his PCP 7/20 and his PCP contacted cardiologist and was advised to come to the ER.  CT head was negative for anything acute, EKG shows NSR with nonspecific diffuse T-wave changes, tele sinus bradycardia. Undergoing workup, Cards consulted  Assessment/Plan: 1. Dizziness/Near Syncope - etiology unclear - had long term symptoms for several years until 1 year back--this to the rehabilitation today 7/22 revealed right vestibular hypofunction in addition patient had MRI 7/22 showing chronic right MCA infarct - Loop recorder 2009 -Orthostatics are not clearly demarcated -  2D ECHO done 7/21 = EF 40-45% with diffuse hypokinesis -tele with some sinus bradycardia  Angina - because of his complaint of some dyspnea with exertion, cardiology plans catheterization 7/23 am  2. P.Afib -rate controlled, continue Coumadin , INR 1.73   3. H/o alcoholic cardiomyopathy -stable  4. CHronic anemia -defer to PCP  5. HTN -continue Avapro/aldactone  DVT proph: Coumadin  Code Status: Full Code Family Communication: none at bedside Disposition Plan: Home pending workup   Consultants:  Cards  HPI/Subjective:  Still with some dizziness Does not feel it is appreciably improved since vestibular rehabilitation done today Tolerated full diet In addition having some shooting right arm pain which is since the needles in quality   Objective: Filed Vitals:   03/19/14 1545  BP: 119/57  Pulse: 57  Temp: 98.4 F (36.9 C)  Resp: 18    Intake/Output Summary (Last 24 hours) at 03/19/14 1804 Last data filed at 03/19/14 1500  Gross per 24 hour   Intake    720 ml  Output   1650 ml  Net   -930 ml   Filed Weights   03/17/14 2148  Weight: 93.94 kg (207 lb 1.6 oz)    Exam:   General:  AAOx3  Cardiovascular: S1S2/RRR  Respiratory: CTAB  Abdomen: soft, NT, BS present  Musculoskeletal: no edema c/c  Neuro: non focal   Data Reviewed: Basic Metabolic Panel:  Recent Labs Lab 03/17/14 1604 03/18/14 0415  NA 139 139  K 3.8 4.2  CL 100 103  CO2 24 26  GLUCOSE 122* 122*  BUN 7 8  CREATININE 0.86 0.98  CALCIUM 8.7 8.7   Liver Function Tests:  Recent Labs Lab 03/18/14 0415  AST 22  ALT 21  ALKPHOS 76  BILITOT 0.8  PROT 7.0  ALBUMIN 3.5   No results found for this basename: LIPASE, AMYLASE,  in the last 168 hours No results found for this basename: AMMONIA,  in the last 168 hours CBC:  Recent Labs Lab 03/17/14 1604 03/18/14 0415  WBC 5.4 4.8  NEUTROABS  --  2.7  HGB 11.4* 11.5*  HCT 35.8* 36.9*  MCV 83.4 85.8  PLT 224 217   Cardiac Enzymes:  Recent Labs Lab 03/17/14 2220  TROPONINI <0.30   BNP (last 3 results) No results found for this basename: PROBNP,  in the last 8760 hours CBG:  Recent Labs Lab 03/18/14 1652 03/18/14 2119 03/19/14 0639 03/19/14 1121 03/19/14 1616  GLUCAP 106* 117* 126* 102* 107*    No results found for this or any previous visit (from the past 240 hour(s)).   Studies: Ct Head Wo Contrast  03/17/2014   CLINICAL DATA:  Dizziness. Unsteady gait. Symptoms for the past 3 days.  EXAM: CT HEAD WITHOUT CONTRAST  TECHNIQUE: Contiguous axial images were obtained from the base of the skull through the vertex without intravenous contrast.  COMPARISON:  Head CT 04/15/2008.  FINDINGS: Again noted is a large area of low attenuation in the right MCA distribution, compatible with encephalomalacia from prior infarction. No acute intracranial abnormalities. Specifically, no evidence of acute intracranial hemorrhage, no definite findings of acute/subacute cerebral ischemia, no  mass, mass effect, hydrocephalus or abnormal intra or extra-axial fluid collections. Visualized paranasal sinuses and mastoids are well pneumatized, with exception of some mild multifocal mucosal thickening in the frontal, maxillary and ethmoid sinuses bilaterally. No acute displaced skull fractures are identified.  IMPRESSION: 1. No acute intracranial abnormalities. 2. 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   Electronically Signed   By: Vinnie Langton M.D.   On: 03/17/2014 18:49   Mr Brain Wo Contrast  03/19/2014   CLINICAL DATA:  Dizziness  EXAM: MRI HEAD WITHOUT CONTRAST  TECHNIQUE: Multiplanar, multiecho pulse sequences of the brain and surrounding structures were obtained without intravenous contrast.  COMPARISON:  CT head 03/17/2014  FINDINGS: Chronic right MCA infarct involving the right posterior temporal parietal lobe with a large area of encephalomalacia filled with CSF. Compensatory enlargement of the right lateral ventricle. Mild chronic microvascular ischemia in the white matter bilaterally.  Negative for acute infarct.  Negative for hemorrhage or mass.  Negative for hydrocephalus  Mild mucosal edema in the paranasal sinuses.  IMPRESSION: Large territory chronic right MCA infarct.  No acute abnormality.   Electronically Signed   By: Franchot Gallo M.D.   On: 03/19/2014 11:53   Dg Chest Port 1 View  03/17/2014   CLINICAL DATA:  Shortness of breath, dizziness, weakness.  EXAM: PORTABLE CHEST - 1 VIEW  COMPARISON:  12/16/2010  FINDINGS: Low lung volumes with bibasilar atelectasis and accentuation of the heart size. No effusions. No acute bony abnormality. Loop recorder device again noted.  IMPRESSION: Low lung volumes, bibasilar atelectasis.   Electronically Signed   By: Rolm Baptise M.D.   On: 03/17/2014 21:47    Scheduled Meds: . atorvastatin  40 mg Oral q1800  . brimonidine  1 drop Both Eyes TID  .  colchicine  0.6 mg Oral Daily  . famotidine  20 mg Oral BID  . ferrous sulfate  325 mg Oral Q breakfast  . irbesartan  300 mg Oral Daily  . latanoprost  1 drop Both Eyes QHS  . loratadine  10 mg Oral Daily  . omega-3 acid ethyl esters  1 g Oral Daily  . spironolactone  25 mg Oral Daily  . tamsulosin  0.4 mg Oral Daily  . Warfarin - Pharmacist Dosing Inpatient   Does not apply q1800   Continuous Infusions:  Antibiotics Given (last 72 hours)   None      Principal Problem:   Dizziness Active Problems:   Alcoholic cardiomyopathy   PAROXYSMAL ATRIAL FIBRILLATION   BRADYCARDIA   Near syncope   Precordial pain    Time spent: 15min    Nita Sells  [facility] Pager [phone]. If 7PM-7AM, please contact night-coverage at [email or web address], password [facility] 03/19/2014, 6:04 PM  LOS: 2 days

## 2014-03-19 NOTE — Evaluation (Addendum)
Physical Therapy Evaluation Patient Details Name: EVERT WENRICH MRN: [id number] DOB: 07/29/1936 Today's Date: 03/19/2014   History of Present Illness  Pt admit with dizziness.  Bradycardia.    Clinical Impression  Pt admitted with above. Pt currently with functional limitations due to the deficits listed below (see PT Problem List).  Pt will benefit from skilled PT to increase their independence and safety with mobility to allow discharge to the venue listed below.      Follow Up Recommendations Outpatient PT (for vestibular rehab)    Equipment Recommendations  None recommended by PT    Recommendations for Other Services Other (comment) (ENT consult)     Precautions / Restrictions Precautions Precautions: Fall Restrictions Weight Bearing Restrictions: No      Mobility  Bed Mobility Overal bed mobility: Independent Bed Mobility: Supine to Sit     Supine to sit: Independent     General bed mobility comments: MD asked for vestibular evaluation.  This PT did a quick vestibular assessment yesterday and could not elicit dizziness during evaluation.  Today, upon further testing, it does appear that pt with positive head thrust on right.  Pt has learned to compensate which is why PT did not find this yesterday.  Pt with hypofunction on right.  Rechecked BPPV and all tests negative.   Transfers Overall transfer level: Independent                  Ambulation/Gait Ambulation/Gait assistance: Independent Ambulation Distance (Feet): 350 Feet Assistive device: None Gait Pattern/deviations: Decreased stride length;Step-through pattern   Gait velocity interpretation: <1.8 ft/sec, indicative of risk for recurrent falls General Gait Details: Pt can withstand min challenges to balance and maintain balance.  Pt states he is close to baseline. At times, pt veered to his right but able to self correct balance.    Stairs            Wheelchair Mobility    Modified [name]  (Stroke Patients Only)       Balance                                             Pertinent Vitals/Pain VSS, no pain    Home Living Family/patient expects to be discharged to:: Private residence Living Arrangements: Alone Available Help at Discharge: Family;Available 24 hours/day (sister could assist if needed perpt) Type of Home: House Home Access: Stairs to enter Entrance Stairs-Rails: None Entrance Stairs-Number of Steps: 3 Home Layout: One level Home Equipment: Walker - 2 wheels;Cane - single point;Shower seat;Bedside commode      Prior Function Level of Independence: Independent               Hand Dominance        Extremity/Trunk Assessment   Upper Extremity Assessment: Defer to OT evaluation           Lower Extremity Assessment: Generalized weakness      Cervical / Trunk Assessment: Normal  Communication   Communication: No difficulties  Cognition Arousal/Alertness: Awake/alert Behavior During Therapy: WFL for tasks assessed/performed Overall Cognitive Status: Within Functional Limits for tasks assessed                      General Comments      Exercises Other Exercises Other Exercises: Initiated x1 exercises with pt.  Pt able to perform but needs cues  to keep eyes on target.  Gave progression of exercise to include sitting, standingf eet apart, standing feet togerther, and marching in place.  Discussed compensatory strategies with moving as well.        Assessment/Plan    PT Assessment Patient needs continued PT services  PT Diagnosis  (dizziness)   PT Problem List Decreased mobility;Decreased safety awareness;Other (comment) (dizziness)  PT Treatment Interventions Therapeutic exercise (gaze stability exercises)   PT Goals (Current goals can be found in the Care Plan section) Acute Rehab PT Goals Patient Stated Goal: to go home PT Goal Formulation: With patient Time For Goal Achievement: 03/26/14 Potential  to Achieve Goals: Good    Frequency Min 2X/week   Barriers to discharge        Co-evaluation               End of Session Equipment Utilized During Treatment: Gait belt Activity Tolerance: Patient limited by fatigue Patient left: in bed;with call bell/phone within reach;with bed alarm set Nurse Communication: Mobility status         Time: 1206-1228 PT Time Calculation (min): 22 min   Charges:   PT Evaluation $Initial PT Evaluation Tier I: 1 Procedure PT Treatments $Therapeutic Exercise: 8-22 mins   PT G Codes:          INGOLD,Nyree Applegate 03-25-2014, 1:37 PM  [facility] Acute Rehabilitation [phone] (pager)

## 2014-03-19 NOTE — Telephone Encounter (Signed)
LM on his VM to call me back.  He called earlier and we missed each other.

## 2014-03-19 NOTE — Progress Notes (Signed)
Subjective: The patient tells me he has been have exertional angina and dyspnea when mowing the grass.  This is new since seeing Dr. Meda Coffee last.  Objective: Vital signs in last 24 hours: Temp:  [97.3 F (36.3 C)-98 F (36.7 C)] 97.3 F (36.3 C) (07/22 0544) Pulse Rate:  [47-64] 47 (07/22 0544) Resp:  [18] 18 (07/22 0544) BP: (128-146)/(60-92) 132/67 mmHg (07/22 0544) SpO2:  [98 %-99 %] 98 % (07/22 0544) Last BM Date: 03/17/14  Intake/Output from previous day: 07/21 0701 - 07/22 0700 In: 600 [P.O.:600] Out: 1700 [Urine:1700] Intake/Output this shift:    Medications Current Facility-Administered Medications  Medication Dose Route Frequency Provider Last Rate Last Dose  . acetaminophen (TYLENOL) tablet 650 mg  650 mg Oral Q4H PRN Rise Patience, MD      . atorvastatin (LIPITOR) tablet 40 mg  40 mg Oral q1800 Rise Patience, MD   40 mg at 03/18/14 1754  . brimonidine (ALPHAGAN) 0.2 % ophthalmic solution 1 drop  1 drop Both Eyes TID Rise Patience, MD   1 drop at 03/19/14 1025  . colchicine tablet 0.6 mg  0.6 mg Oral Daily Rise Patience, MD   0.6 mg at 03/19/14 1023  . famotidine (PEPCID) tablet 20 mg  20 mg Oral BID Rise Patience, MD   20 mg at 03/19/14 1023  . ferrous sulfate tablet 325 mg  325 mg Oral Q breakfast Rise Patience, MD   325 mg at 03/19/14 0735  . hydrocortisone (ANUSOL-HC) 2.5 % rectal cream 1 application  1 application Rectal BID PRN Rise Patience, MD      . hydrocortisone cream 1 % 1 application  1 application Topical BID PRN Rise Patience, MD      . irbesartan (AVAPRO) tablet 300 mg  300 mg Oral Daily Rise Patience, MD   300 mg at 03/19/14 1024  . latanoprost (XALATAN) 0.005 % ophthalmic solution 1 drop  1 drop Both Eyes QHS Rise Patience, MD   1 drop at 03/18/14 2223  . loratadine (CLARITIN) tablet 10 mg  10 mg Oral Daily Rise Patience, MD   10 mg at 03/19/14 1023  . meclizine (ANTIVERT) tablet 25  mg  25 mg Oral TID PRN Rise Patience, MD      . nitroGLYCERIN (NITROLINGUAL) 0.4 MG/SPRAY spray 1 spray  1 spray Sublingual Q5 Min x 3 PRN Rise Patience, MD      . omega-3 acid ethyl esters (LOVAZA) capsule 1 g  1 g Oral Daily Rise Patience, MD   1 g at 03/18/14 1050  . spironolactone (ALDACTONE) tablet 25 mg  25 mg Oral Daily Rise Patience, MD   25 mg at 03/19/14 1023  . tamsulosin (FLOMAX) capsule 0.4 mg  0.4 mg Oral Daily Rise Patience, MD   0.4 mg at 03/19/14 1023  . warfarin (COUMADIN) tablet 4 mg  4 mg Oral ONCE-1800 Cecilio Asper Stover, RPH      . Warfarin - Pharmacist Dosing Inpatient   Does not apply q1800 Rise Patience, MD        PE: General appearance: alert, cooperative and no distress Lungs: clear to auscultation bilaterally Heart: regular rate and rhythm, S1, S2 normal, no murmur, click, rub or gallop Extremities: No LEE Pulses: 2+ and symmetric Skin: Warm and dry Neurologic: Grossly normal  Lab Results:   Recent Labs  03/17/14 1604 03/18/14 0415  WBC 5.4 4.8  HGB 11.4* 11.5*  HCT 35.8* 36.9*  PLT 224 217   BMET  Recent Labs  03/17/14 1604 03/18/14 0415  NA 139 139  K 3.8 4.2  CL 100 103  CO2 24 26  GLUCOSE 122* 122*  BUN 7 8  CREATININE 0.86 0.98  CALCIUM 8.7 8.7   PT/INR  Recent Labs  03/17/14 1604 03/18/14 0415 03/19/14 0354  LABPROT 25.5* 22.1* 20.3*  INR 2.32* 1.93* 1.73*    Assessment/Plan   Principal Problem:   Dizziness   Near syncope  Active Problems:   Alcoholic cardiomyopathy  See echo results below   PAROXYSMAL ATRIAL FIBRILLATION  On Coumadin   BRADYCARDIA Currently maintaining NSR in the 70's.  He goes down to the 40's at night.  Per Dr. Jacalyn Lefevre note there is no correlation with dizziness/presyncope and HR.  BP stable. Avoiding AVN blocking agents.  Echocardiogram:  EF 40-45% with diffuse hypokinesis. Nonobstructive CAD by cath in 2012-40% prox LAD, 40%distal cirx,  Mid RCA stent with  no stenosis, 40% proc RCA.  EF at the time of cath was 55-60%    Angina, stable The patient states he has DOE and angina when he mows the grass.  Reduced EF compared to prior echo. Will order [employer] tomorrow.    HTN  Stable.  Avapro 300mg , aldactone 25.   HLD  On statin     LOS: 2 days    HAGER, BRYAN PA-C 03/19/2014 10:33 AM  History and all data above reviewed.  Patient examined.  I agree with the findings as above.  The patient exam reveals COR:RRR  ,  Lungs: Clear  ,  Abd: Positive bowel sounds, no rebound no guarding, Ext No edema  .  All available labs, radiology testing, previous records reviewed. Agree with documented assessment and plan. Cardiomyopathy:   The EF is much better than it was sometime ago when it was 10%.  However, it does appear to be lower than the most recent echo.  Given this, previous nonobstructive disease and description of chest pain, we plan a nuclear study in the AM.   Bradycardia:  No indication for pacing.   Jeneen Rinks Michail Boyte  11:14 AM  03/19/2014

## 2014-03-19 NOTE — Progress Notes (Signed)
ANTICOAGULATION CONSULT NOTE - Follow Up Consult  Pharmacy Consult for coumadin Indication: atrial fibrillation  No Known Allergies  Vital Signs: Temp: 97.3 F (36.3 C) (07/22 0544) Temp src: Oral (07/22 0544) BP: 132/67 mmHg (07/22 0544) Pulse Rate: 47 (07/22 0544)  Labs:  Recent Labs  03/17/14 1604 03/17/14 2220 03/18/14 0415 03/19/14 0354  HGB 11.4*  --  11.5*  --   HCT 35.8*  --  36.9*  --   PLT 224  --  217  --   LABPROT 25.5*  --  22.1* 20.3*  INR 2.32*  --  1.93* 1.73*  CREATININE 0.86  --  0.98  --   TROPONINI  --  <0.30  --   --     Estimated Creatinine Clearance: 73.2 ml/min (by C-G formula based on Cr of 0.98).  Assessment: 78 yo who was admitted for vertigo. He has been on coumadin for afib. INR on admission is therapeutic. Coumadin has been ordered to cont here. INR down to 1.73 today. No documented bleeding.  Goal of Therapy:  INR 2-3 Monitor platelets by anticoagulation protocol: Yes   Plan:  - Coumadin 4mg  PO x1 - Daily INR  Daysen Gundrum J 03/19/2014,9:11 AM

## 2014-03-19 NOTE — Telephone Encounter (Signed)
LM for him to call me back

## 2014-03-20 ENCOUNTER — Observation Stay (HOSPITAL_COMMUNITY): Payer: Medicare Other

## 2014-03-20 ENCOUNTER — Encounter (HOSPITAL_COMMUNITY): Payer: Self-pay | Admitting: Physician Assistant

## 2014-03-20 DIAGNOSIS — R079 Chest pain, unspecified: Secondary | ICD-10-CM

## 2014-03-20 LAB — GLUCOSE, CAPILLARY
Glucose-Capillary: 128 mg/dL — ABNORMAL HIGH (ref 70–99)
Glucose-Capillary: 165 mg/dL — ABNORMAL HIGH (ref 70–99)

## 2014-03-20 LAB — COMPREHENSIVE METABOLIC PANEL
ALT: 22 U/L (ref 0–53)
AST: 23 U/L (ref 0–37)
Albumin: 3.4 g/dL — ABNORMAL LOW (ref 3.5–5.2)
Alkaline Phosphatase: 82 U/L (ref 39–117)
Anion gap: 10 (ref 5–15)
BUN: 15 mg/dL (ref 6–23)
CO2: 27 mEq/L (ref 19–32)
Calcium: 8.9 mg/dL (ref 8.4–10.5)
Chloride: 102 mEq/L (ref 96–112)
Creatinine, Ser: 0.98 mg/dL (ref 0.50–1.35)
GFR calc Af Amer: >90 mL/min (ref >90)
GFR calc non Af Amer: 78 mL/min — ABNORMAL LOW (ref >90)
Glucose, Bld: 138 mg/dL — ABNORMAL HIGH (ref 70–99)
Potassium: 4.4 mEq/L (ref 3.7–5.3)
Sodium: 139 mEq/L (ref 137–147)
Total Bilirubin: 0.6 mg/dL (ref 0.3–1.2)
Total Protein: 6.9 g/dL (ref 6.0–8.3)

## 2014-03-20 LAB — CBC WITH DIFFERENTIAL/PLATELET
Basophils Absolute: 0 10*3/uL (ref 0.0–0.1)
Basophils Relative: 0 % (ref 0–1)
Eosinophils Absolute: 0.3 10*3/uL (ref 0.0–0.7)
Eosinophils Relative: 6 % — ABNORMAL HIGH (ref 0–5)
HCT: 36 % — ABNORMAL LOW (ref 39.0–52.0)
Hemoglobin: 11.3 g/dL — ABNORMAL LOW (ref 13.0–17.0)
Lymphocytes Relative: 28 % (ref 12–46)
Lymphs Abs: 1.4 10*3/uL (ref 0.7–4.0)
MCH: 26.5 pg (ref 26.0–34.0)
MCHC: 31.4 g/dL (ref 30.0–36.0)
MCV: 84.3 fL (ref 78.0–100.0)
Monocytes Absolute: 0.6 10*3/uL (ref 0.1–1.0)
Monocytes Relative: 11 % (ref 3–12)
Neutro Abs: 2.8 10*3/uL (ref 1.7–7.7)
Neutrophils Relative %: 55 % (ref 43–77)
Platelets: 216 10*3/uL (ref 150–400)
RBC: 4.27 MIL/uL (ref 4.22–5.81)
RDW: 16 % — ABNORMAL HIGH (ref 11.5–15.5)
WBC: 5.1 10*3/uL (ref 4.0–10.5)

## 2014-03-20 LAB — PROTIME-INR
INR: 1.81 — ABNORMAL HIGH (ref 0.00–1.49)
Prothrombin Time: 21 seconds — ABNORMAL HIGH (ref 11.6–15.2)

## 2014-03-20 MED ORDER — TECHNETIUM TC 99M SESTAMIBI GENERIC - CARDIOLITE
10.0000 | Freq: Once | INTRAVENOUS | Status: AC | PRN
  Administered 2014-03-20: 10 via INTRAVENOUS

## 2014-03-20 MED ORDER — WARFARIN SODIUM 4 MG PO TABS
4.0000 mg | ORAL_TABLET | Freq: Once | ORAL | Status: AC
  Administered 2014-03-20: 4 mg via ORAL
  Filled 2014-03-20: qty 1

## 2014-03-20 MED ORDER — REGADENOSON 0.4 MG/5ML IV SOLN
INTRAVENOUS | Status: AC
  Administered 2014-03-20: 10:00:00
  Filled 2014-03-20: qty 5

## 2014-03-20 NOTE — Progress Notes (Signed)
Got a call from nuclear medicine that they will sent for pt for his stress test at about 0815 and will be down there for about 4hrs, same related back to pt, pt reassured, v/s stable, will continue to monitor. Obasogie-Asidi, Madoline Bhatt Efe

## 2014-03-20 NOTE — Progress Notes (Signed)
Physical Therapy Treatment Patient Details Name: FORDYCE LEPAK MRN: [id number] DOB: 30-Mar-1936 Today's Date: 03/29/14    History of Present Illness Pt admit with dizziness.  Bradycardia.      PT Comments    Pt admitted with above. Pt currently with functional limitations due to balance and endurance deficits.  Pt will benefit from skilled PT to increase their independence and safety with mobility to allow discharge to the venue listed below.   Follow Up Recommendations  Outpatient PT (for vestibular rehab)     Equipment Recommendations  None recommended by PT    Recommendations for Other Services Other (comment) (ENT consult)     Precautions / Restrictions Precautions Precautions: Fall Restrictions Weight Bearing Restrictions: No    Mobility  Bed Mobility Overal bed mobility: Independent Bed Mobility: Supine to Sit     Supine to sit: Independent        Transfers Overall transfer level: Independent                  Ambulation/Gait                 Stairs            Wheelchair Mobility    Modified [name] (Stroke Patients Only)       Balance                                    Cognition Arousal/Alertness: Awake/alert Behavior During Therapy: WFL for tasks assessed/performed Overall Cognitive Status: Within Functional Limits for tasks assessed                      Exercises Other Exercises Other Exercises: Performed x1 exercises with pt.  Pt able to perform but needs cues to keep eyes on target.  Today took incr time to get pt to rotate his head side to side.  Pt kept only moving head right to midline or left to midline.  Finally began doing it correctly side to side.  Pt performed the exercise in sitting and standing.  Guarded pt closely during standing exercises.  Gave progression of exercise to include sitting, standing feet apart, standing feet together, and marching in place.      General Comments         Pertinent Vitals/Pain VSS, no pain    Home Living                      Prior Function            PT Goals (current goals can now be found in the care plan section) Progress towards PT goals: Progressing toward goals    Frequency  Min 2X/week    PT Plan Current plan remains appropriate    Co-evaluation             End of Session Equipment Utilized During Treatment: Gait belt Activity Tolerance: Patient limited by fatigue Patient left: in bed;with call bell/phone within reach;with bed alarm set     Time: 4098-1191 PT Time Calculation (min): 14 min  Charges:  $Therapeutic Exercise: 8-22 mins                    G Codes:      INGOLD,Treshun Wold 29-Mar-2014, 12:59 PM [facility] Acute Rehabilitation [phone] (pager)

## 2014-03-20 NOTE — Discharge Summary (Signed)
Physician Discharge Summary  Vincent [name] ASN:[id number] DOB: 09-Jun-1936 DOA: 03/17/2014  PCP: [name], MD  Admit date: 03/17/2014 Discharge date: 03/20/2014  Time spent: 35 minutes  Recommendations for Outpatient Follow-up:  1. INR/CMet/CBC in about 2-5 days  2. Needs vestib rehab as OP 3. Consider OP work-up of anemia-he should have elective colonoopy as OP if this hasn't been completed recently  Discharge Diagnoses:  Principal Problem:   Dizziness Active Problems:   Alcoholic cardiomyopathy   PAROXYSMAL ATRIAL FIBRILLATION   BRADYCARDIA   Near syncope   Precordial pain   Discharge Condition: fair  Diet recommendation: heart healthy low salt  Filed Weights   03/17/14 2148  Weight: 93.94 kg (207 lb 1.6 oz)    History of present illness:   Vincent [name] is a 78/M with history of alcoholic cardiomyopathy, Afib on Coumadin, CVA, hyperlipidemia and gout had been experiencing dizziness over the last 3 days, especially when he tried to walk.  He went to his PCP 7/20 and his PCP contacted cardiologist and was advised to come to the ER.  CT head was negative for anything acute, EKG shows NSR with nonspecific diffuse T-wave changes, tele sinus bradycardia.  Undergoing workup, Cards consulted and recommended Myoview as some exertional CP ultimately Myoview was neg for acute insult and patient was deemed approp to d/c home by cardiology  Hospital Course: Dizziness/Near Syncope - had long term symptoms for several years until 1 year back--Vestib rehabilitation 7/22 revealed right vestibular hypofunction in addition patient had MRI 7/22 showing chronic right MCA infarct--needs Vestib Rehab as OP  - Loop recorder 2009  -Orthostatics are not clearly demarcated  - 2D ECHO done 7/21 = EF 40-45% with diffuse hypokinesis  -tele with some sinus bradycardia  -no further work-up -Ms Idolina Primer of cardiology has arranged INr re-check and [place] cardiology input as OP  Angina  -  because of his complaint of some dyspnea with exertion, myoview done 7/23 am. -results below were neg for acute insult  P.Afib  -rate controlled, continue Coumadin , INR 1.81 on d/c home -to resume home dosing with close INr re-checks   H/o alcoholic cardiomyopathy  -stable this admission -counselled cessation  Chonic anemia  -defer to PCP  -consider Colonoscopy as OP   HTN  -continue Avapro/aldactone as OP -labs 1 week     Procedures: myoview 03/20/14 reduced activity on stress and rest images,  favoring scar. No inducible ischemia identified. Apical thinning.  2. Mild to moderate hypokinesis and poor wall thickening in the  septum and inferior wall. Left ventricular ejection fraction 46%.    (i.e. Studies not automatically included, echos, thoracentesis, etc; not x-rays)  Consultations: Cardiology   Discharge Exam: Filed Vitals:   03/20/14 1353  BP: 144/54  Pulse: 50  Temp: 97.7 F (36.5 C)  Resp: 18    General: alert no further paisn in ches tor arms Respiratory: clear   Discharge Instructions You were cared for by a hospitalist during your hospital stay. If you have any questions about your discharge medications or the care you received while you were in the hospital after you are discharged, you can call the unit and asked to speak with the hospitalist on call if the hospitalist that took care of you is not available. Once you are discharged, your primary care physician will handle any further medical issues. Please note that NO REFILLS for any discharge medications will be authorized once you are discharged, as it is imperative that you return  to your primary care physician (or establish a relationship with a primary care physician if you do not have one) for your aftercare needs so that they can reassess your need for medications and monitor your lab values.  Discharge Instructions   Diet - low sodium heart healthy    Complete by:  As directed       Discharge instructions    Complete by:  As directed   Need lab work in about 1 week Cardiology will call you as an outpatient to discuss your care Continue salt restriction and other things that have been previously recommended to you     Increase activity slowly    Complete by:  As directed             Medication List         aspirin 81 MG chewable tablet  Chew 81 mg by mouth daily.     atorvastatin 40 MG tablet  Commonly known as:  LIPITOR  Take 40 mg by mouth daily at 6 PM.     bimatoprost 0.03 % ophthalmic solution  Commonly known as:  LUMIGAN  Place 1 drop into both eyes at bedtime.     brimonidine 0.1 % Soln  Commonly known as:  ALPHAGAN P  Place 1 drop into the right eye 2 (two) times daily.     colchicine 0.6 MG tablet  Take 0.6 mg by mouth daily.     famotidine 20 MG tablet  Commonly known as:  PEPCID  Take 20 mg by mouth 2 (two) times daily.     ferrous sulfate 325 (65 FE) MG tablet  Take 325 mg by mouth daily with breakfast.     Fish Oil 1000 MG Caps  Take 1,000 mg by mouth daily.     hydrocortisone 2.5 % rectal cream  Commonly known as:  ANUSOL-HC  Place 1 application rectally 2 (two) times daily as needed for hemorrhoids or itching.     hydrocortisone cream 1 %  Apply 1 application topically 2 (two) times daily as needed (for eczmea).     irbesartan 300 MG tablet  Commonly known as:  AVAPRO  Take 300 mg by mouth daily.     loratadine 10 MG tablet  Commonly known as:  CLARITIN  Take 10 mg by mouth daily.     meclizine 25 MG tablet  Commonly known as:  ANTIVERT  Take 25 mg by mouth 3 (three) times daily as needed for dizziness.     nitroGLYCERIN 0.4 MG/SPRAY spray  Commonly known as:  NITROLINGUAL  Place 1 spray under the tongue every 5 (five) minutes x 3 doses as needed for chest pain.     spironolactone 25 MG tablet  Commonly known as:  ALDACTONE  Take 25 mg by mouth daily.     tamsulosin 0.4 MG Caps capsule  Commonly known as:   FLOMAX  Take 0.4 mg by mouth daily.     warfarin 2 MG tablet  Commonly known as:  COUMADIN  Take 2-4 mg by mouth daily. *takes 4mg  every day except for 2mg  on mondays and fridays*       No Known Allergies     Follow-up Information   Follow up with [facility]. (Office will call you for a Coumadin check for Monday 7/27 or Tuesday 7/28)    Contact information:   [address] [place] [address]       Follow up with [name],  MD. (Office will call you for your followup appointment. Call office if you have not heard back in 3 business days.)    Specialty:  Cardiology   Contact information:   [place] [address] [phone]        The results of significant diagnostics from this hospitalization (including imaging, microbiology, ancillary and laboratory) are listed below for reference.    Significant Diagnostic Studies: Ct Head Wo Contrast  03/17/2014   CLINICAL DATA:  Dizziness. Unsteady gait. Symptoms for the past 3 days.  EXAM: CT HEAD WITHOUT CONTRAST  TECHNIQUE: Contiguous axial images were obtained from the base of the skull through the vertex without intravenous contrast.  COMPARISON:  Head CT 04/15/2008.  FINDINGS: Again noted is a large area of low attenuation in the right MCA distribution, compatible with encephalomalacia from prior infarction. No acute intracranial abnormalities. Specifically, no evidence of acute intracranial hemorrhage, no definite findings of acute/subacute cerebral ischemia, no mass, mass effect, hydrocephalus or abnormal intra or extra-axial fluid collections. Visualized paranasal sinuses and mastoids are well pneumatized, with exception of some mild multifocal mucosal thickening in the frontal, maxillary and ethmoid sinuses bilaterally. No acute displaced skull fractures are identified.  IMPRESSION: 1. No acute intracranial abnormalities. 2. Encephalomalacia throughout the right MCA  territory related to the remote right MCA territory infarction, similar to prior studies.   Electronically Signed   By: Vinnie Langton M.D.   On: 03/17/2014 18:49   Mr Brain Wo Contrast  03/19/2014   CLINICAL DATA:  Dizziness  EXAM: MRI HEAD WITHOUT CONTRAST  TECHNIQUE: Multiplanar, multiecho pulse sequences of the brain and surrounding structures were obtained without intravenous contrast.  COMPARISON:  CT head 03/17/2014  FINDINGS: Chronic right MCA infarct involving the right posterior temporal parietal lobe with a large area of encephalomalacia filled with CSF. Compensatory enlargement of the right lateral ventricle. Mild chronic microvascular ischemia in the white matter bilaterally.  Negative for acute infarct.  Negative for hemorrhage or mass.  Negative for hydrocephalus  Mild mucosal edema in the paranasal sinuses.  IMPRESSION: Large territory chronic right MCA infarct.  No acute abnormality.   Electronically Signed   By: Franchot Gallo M.D.   On: 03/19/2014 11:53   Nm Myocar Multi W/spect W/wall Motion / Ef  03/20/2014   CLINICAL DATA:  Chest pain  EXAM: MYOCARDIAL IMAGING WITH SPECT (REST AND PHARMACOLOGIC-STRESS - 2 DAY PROTOCOL)  GATED LEFT VENTRICULAR WALL MOTION STUDY  LEFT VENTRICULAR EJECTION FRACTION  TECHNIQUE: Standard myocardial SPECT imaging was performed after resting intravenous injection of 10 mCi Tc-76m sestamibi. Subsequently, on a second day, intravenous infusion of Lexiscan was performed under the supervision of the Cardiology staff. At peak effect of the drug, 30 mCi Tc-22m sestamibi was injected intravenously and standard myocardial SPECT imaging was performed. Quantitative gated imaging was also performed to evaluate left ventricular wall motion, and estimate left ventricular ejection fraction.  COMPARISON:  06/10/2008 cardiac MRI  FINDINGS: Myocardial perfusion study: Reduced activity in the inferior wall on stress and rest images, favoring scar over diaphragmatic attenuation.  Apical thinning.  Ejection fraction calculation: End-diastolic volume is 195 mL. End systolic volume is 78 mL. Derived left ventricular ejection fraction of 46%.  Wall motion analysis: Mild to moderate hypokinesis and poor wall thickening in the septum and inferior wall.  IMPRESSION: 1. Inferior wall reduced activity on stress and rest images, favoring scar. No inducible ischemia identified. Apical thinning. 2. Mild to moderate hypokinesis and poor wall thickening  in the septum and inferior wall. Left ventricular ejection fraction 46%.   Electronically Signed   By: Sherryl Barters M.D.   On: 03/20/2014 16:47   Dg Chest Port 1 View  03/17/2014   CLINICAL DATA:  Shortness of breath, dizziness, weakness.  EXAM: PORTABLE CHEST - 1 VIEW  COMPARISON:  12/16/2010  FINDINGS: Low lung volumes with bibasilar atelectasis and accentuation of the heart size. No effusions. No acute bony abnormality. Loop recorder device again noted.  IMPRESSION: Low lung volumes, bibasilar atelectasis.   Electronically Signed   By: Rolm Baptise M.D.   On: 03/17/2014 21:47    Microbiology: No results found for this or any previous visit (from the past 240 hour(s)).   Labs: Basic Metabolic Panel:  Recent Labs Lab 03/17/14 1604 03/18/14 0415 03/20/14 0220  NA 139 139 139  K 3.8 4.2 4.4  CL 100 103 102  CO2 24 26 27   GLUCOSE 122* 122* 138*  BUN 7 8 15   CREATININE 0.86 0.98 0.98  CALCIUM 8.7 8.7 8.9   Liver Function Tests:  Recent Labs Lab 03/18/14 0415 03/20/14 0220  AST 22 23  ALT 21 22  ALKPHOS 76 82  BILITOT 0.8 0.6  PROT 7.0 6.9  ALBUMIN 3.5 3.4*   No results found for this basename: LIPASE, AMYLASE,  in the last 168 hours No results found for this basename: AMMONIA,  in the last 168 hours CBC:  Recent Labs Lab 03/17/14 1604 03/18/14 0415 03/20/14 0220  WBC 5.4 4.8 5.1  NEUTROABS  --  2.7 2.8  HGB 11.4* 11.5* 11.3*  HCT 35.8* 36.9* 36.0*  MCV 83.4 85.8 84.3  PLT 224 217 216   Cardiac  Enzymes:  Recent Labs Lab 03/17/14 2220  TROPONINI <0.30   BNP: BNP (last 3 results) No results found for this basename: PROBNP,  in the last 8760 hours CBG:  Recent Labs Lab 03/19/14 1121 03/19/14 1616 03/19/14 2123 03/20/14 0601 03/20/14 1625  GLUCAP 102* 107* 139* 128* 165*       Signed:  Nita Sells  [facility] 03/20/2014, 5:55 PM

## 2014-03-20 NOTE — Progress Notes (Signed)
Patient: Vincent [name] / Admit Date: 03/17/2014 / Date of Encounter: 03/20/2014, 9:58 AM   Subjective: No complaints.  Tolerated Lexiscan stress test without complaint or complication.  Objective: Telemetry: NSR/SB. No heart block noted. Physical Exam: Blood pressure 159/65, pulse 42, temperature 97.9 F (36.6 C), temperature source Oral, resp. rate 18, height 5' 9.5" (1.765 m), weight 207 lb 1.6 oz (93.94 kg), SpO2 99.00%. General: Well developed, well nourished AAM, in no acute distress. Head: Normocephalic, atraumatic, sclera non-icteric, no xanthomas, nares are without discharge. Arcus senilis. Neck: Supple. Lungs: Clear anteriorly bilaterally to auscultation without wheezes or rales. Breathing is unlabored. Heart: RRR S1 S2 without murmurs, rubs, or gallops.  Abdomen: Soft, non-tender, non-distended with normoactive bowel sounds. No rebound/guarding. Extremities: No clubbing or cyanosis. No edema. Distal pedal pulses are 2+ and equal bilaterally. Neuro: Alert and oriented X 3. Moves all extremities spontaneously. Psych:  Responds to questions appropriately with a normal affect.   Intake/Output Summary (Last 24 hours) at 03/20/14 0958 Last data filed at 03/20/14 0837  Gross per 24 hour  Intake    360 ml  Output   1400 ml  Net  -1040 ml    Inpatient Medications:  . atorvastatin  40 mg Oral q1800  . brimonidine  1 drop Both Eyes TID  . colchicine  0.6 mg Oral Daily  . famotidine  20 mg Oral BID  . ferrous sulfate  325 mg Oral Q breakfast  . irbesartan  300 mg Oral Daily  . latanoprost  1 drop Both Eyes QHS  . loratadine  10 mg Oral Daily  . omega-3 acid ethyl esters  1 g Oral Daily  . regadenoson      . spironolactone  25 mg Oral Daily  . tamsulosin  0.4 mg Oral Daily  . Warfarin - Pharmacist Dosing Inpatient   Does not apply q1800   Infusions:    Labs:  Recent Labs  03/18/14 0415 03/20/14 0220  NA 139 139  K 4.2 4.4  CL 103 102  CO2 26 27  GLUCOSE 122*  138*  BUN 8 15  CREATININE 0.98 0.98  CALCIUM 8.7 8.9    Recent Labs  03/18/14 0415 03/20/14 0220  AST 22 23  ALT 21 22  ALKPHOS 76 82  BILITOT 0.8 0.6  PROT 7.0 6.9  ALBUMIN 3.5 3.4*    Recent Labs  03/18/14 0415 03/20/14 0220  WBC 4.8 5.1  NEUTROABS 2.7 2.8  HGB 11.5* 11.3*  HCT 36.9* 36.0*  MCV 85.8 84.3  PLT 217 216    Recent Labs  03/17/14 2220  TROPONINI <0.30   No components found with this basename: POCBNP,  No results found for this basename: HGBA1C,  in the last 72 hours   Radiology/Studies:  Ct Head Wo Contrast  03/17/2014   CLINICAL DATA:  Dizziness. Unsteady gait. Symptoms for the past 3 days.  EXAM: CT HEAD WITHOUT CONTRAST  TECHNIQUE: Contiguous axial images were obtained from the base of the skull through the vertex without intravenous contrast.  COMPARISON:  Head CT 04/15/2008.  FINDINGS: Again noted is a large area of low attenuation in the right MCA distribution, compatible with encephalomalacia from prior infarction. No acute intracranial abnormalities. Specifically, no evidence of acute intracranial hemorrhage, no definite findings of acute/subacute cerebral ischemia, no mass, mass effect, hydrocephalus or abnormal intra or extra-axial fluid collections. Visualized paranasal sinuses and mastoids are well pneumatized, with exception of some mild multifocal mucosal thickening in the frontal, maxillary and ethmoid  sinuses bilaterally. No acute displaced skull fractures are identified.  IMPRESSION: 1. No acute intracranial abnormalities. 2. 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   Electronically Signed   By: Vinnie Langton M.D.   On: 03/17/2014 18:49   Mr Brain Wo Contrast  03/19/2014   CLINICAL DATA:  Dizziness  EXAM: MRI HEAD WITHOUT CONTRAST  TECHNIQUE: Multiplanar, multiecho pulse sequences of the brain and surrounding structures were obtained without  intravenous contrast.  COMPARISON:  CT head 03/17/2014  FINDINGS: Chronic right MCA infarct involving the right posterior temporal parietal lobe with a large area of encephalomalacia filled with CSF. Compensatory enlargement of the right lateral ventricle. Mild chronic microvascular ischemia in the white matter bilaterally.  Negative for acute infarct.  Negative for hemorrhage or mass.  Negative for hydrocephalus  Mild mucosal edema in the paranasal sinuses.  IMPRESSION: Large territory chronic right MCA infarct.  No acute abnormality.   Electronically Signed   By: Franchot Gallo M.D.   On: 03/19/2014 11:53   Dg Chest Port 1 View  03/17/2014   CLINICAL DATA:  Shortness of breath, dizziness, weakness.  EXAM: PORTABLE CHEST - 1 VIEW  COMPARISON:  12/16/2010  FINDINGS: Low lung volumes with bibasilar atelectasis and accentuation of the heart size. No effusions. No acute bony abnormality. Loop recorder device again noted.  IMPRESSION: Low lung volumes, bibasilar atelectasis.   Electronically Signed   By: Rolm Baptise M.D.   On: 03/17/2014 21:47     Assessment and Plan  1. Dizziness/near syncope, not currently felt related to his chronic bradycardia - has had intermittent dizziness for years and previous implantable loop show no correlation of dizziness with abnormal rhythm. Given no correlation with symptoms there is no indication for pacemaker. Would avoid AV nodal blocking agents. 2. Alcoholic cardiomyopathy - EF 40-45%, reduced from most recent echo 2009 but still up from 2003 when it was 10-20%. Continue ARB, spironolactone. 3. CAD (prior stenting to RCA, nonobstructive by cath 2012) with chest pain/DOE when mowing the lawn - pending nuc results today. 4. HTN - follow BP after med administration. 5. Hyperlipidemia - continue statin. 6. Paroxysmal atrial fibrillation - on Coumadin, currently NSR, being managed by pharmacy.  Signed, Melina Copa PA-C  History and all data above reviewed.  Patient  examined.  I agree with the findings as above. No chest pain or SOB. The patient exam reveals COR:RRR  ,  Lungs: Clear  ,  Abd: Positive bowel sounds, no rebound no guarding, Ext No edema  .  All available labs, radiology testing, previous records reviewed. Agree with documented assessment and plan. Awaiting results of stress perfusion study.  Continue current meds.  No indication for pacing.    Jeneen Rinks Yomira Flitton  2:13 PM  03/20/2014

## 2014-03-20 NOTE — Telephone Encounter (Signed)
LM to call me back.

## 2014-03-20 NOTE — Progress Notes (Signed)
[facility] PROGRESS NOTE  Vincent [name] HFW:[id number] DOB: 1935-09-27 DOA: 03/17/2014 PCP: [name], MD  Brief Narrative: HPI: Vincent [name] is a 78/M with history of alcoholic cardiomyopathy, Afib on Coumadin, CVA, hyperlipidemia and gout had been experiencing dizziness over the last 3 days, especially when he tried to walk.  He went to his PCP 7/20 and his PCP contacted cardiologist and was advised to come to the ER.  CT head was negative for anything acute, EKG shows NSR with nonspecific diffuse T-wave changes, tele sinus bradycardia. Undergoing workup, Cards consulted and recommended Myoview as some exertional CP  Assessment/Plan: 1. Dizziness/Near Syncope - etiology unclear - had long term symptoms for several years until 1 year back--Vestib rehabilitation 7/22 revealed right vestibular hypofunction in addition patient had MRI 7/22 showing chronic right MCA infarct--needs Vestib Rehab as OP - Loop recorder 2009 -Orthostatics are not clearly demarcated -  2D ECHO done 7/21 = EF 40-45% with diffuse hypokinesis -tele with some sinus bradycardia  Angina - because of his complaint of some dyspnea with exertion, myoview done 7/23 am -results are pending  2. P.Afib -rate controlled, continue Coumadin , INR 1.81 Pharmacy dosing  3. H/o alcoholic cardiomyopathy -stable  4. CHronic anemia -defer to PCP  5. HTN -continue Avapro/aldactone  DVT proph: Coumadin  Code Status: Full Code Family Communication: sister present-he didn't wish her updated Disposition Plan: Home pending workup with myview   Consultants:  Cards  HPI/Subjective:  Better Ambulated steadily today NO acute issue  Objective: Filed Vitals:   03/20/14 1353  BP: 144/54  Pulse: 50  Temp: 97.7 F (36.5 C)  Resp: 18    Intake/Output Summary (Last 24 hours) at 03/20/14 1638 Last data filed at 03/20/14 1354  Gross per 24 hour  Intake    240 ml  Output   1100 ml  Net   -860 ml    Filed Weights   03/17/14 2148  Weight: 93.94 kg (207 lb 1.6 oz)    Exam:   General:  AAOx3  Cardiovascular: S1S2/RRR  Respiratory: CTAB  Abdomen: soft, NT, BS present  Musculoskeletal: no edema c/c  Neuro: non focal   Data Reviewed: Basic Metabolic Panel:  Recent Labs Lab 03/17/14 1604 03/18/14 0415 03/20/14 0220  NA 139 139 139  K 3.8 4.2 4.4  CL 100 103 102  CO2 24 26 27   GLUCOSE 122* 122* 138*  BUN 7 8 15   CREATININE 0.86 0.98 0.98  CALCIUM 8.7 8.7 8.9   Liver Function Tests:  Recent Labs Lab 03/18/14 0415 03/20/14 0220  AST 22 23  ALT 21 22  ALKPHOS 76 82  BILITOT 0.8 0.6  PROT 7.0 6.9  ALBUMIN 3.5 3.4*   No results found for this basename: LIPASE, AMYLASE,  in the last 168 hours No results found for this basename: AMMONIA,  in the last 168 hours CBC:  Recent Labs Lab 03/17/14 1604 03/18/14 0415 03/20/14 0220  WBC 5.4 4.8 5.1  NEUTROABS  --  2.7 2.8  HGB 11.4* 11.5* 11.3*  HCT 35.8* 36.9* 36.0*  MCV 83.4 85.8 84.3  PLT 224 217 216   Cardiac Enzymes:  Recent Labs Lab 03/17/14 2220  TROPONINI <0.30   BNP (last 3 results) No results found for this basename: PROBNP,  in the last 8760 hours CBG:  Recent Labs Lab 03/19/14 1121 03/19/14 1616 03/19/14 2123 03/20/14 0601 03/20/14 1625  GLUCAP 102* 107* 139* 128* 165*    No results found for this or any  previous visit (from the past 240 hour(s)).   Studies: Mr Brain Wo Contrast  03/19/2014   CLINICAL DATA:  Dizziness  EXAM: MRI HEAD WITHOUT CONTRAST  TECHNIQUE: Multiplanar, multiecho pulse sequences of the brain and surrounding structures were obtained without intravenous contrast.  COMPARISON:  CT head 03/17/2014  FINDINGS: Chronic right MCA infarct involving the right posterior temporal parietal lobe with a large area of encephalomalacia filled with CSF. Compensatory enlargement of the right lateral ventricle. Mild chronic microvascular ischemia in the white matter  bilaterally.  Negative for acute infarct.  Negative for hemorrhage or mass.  Negative for hydrocephalus  Mild mucosal edema in the paranasal sinuses.  IMPRESSION: Large territory chronic right MCA infarct.  No acute abnormality.   Electronically Signed   By: Franchot Gallo M.D.   On: 03/19/2014 11:53    Scheduled Meds: . atorvastatin  40 mg Oral q1800  . brimonidine  1 drop Both Eyes TID  . colchicine  0.6 mg Oral Daily  . famotidine  20 mg Oral BID  . ferrous sulfate  325 mg Oral Q breakfast  . irbesartan  300 mg Oral Daily  . latanoprost  1 drop Both Eyes QHS  . loratadine  10 mg Oral Daily  . omega-3 acid ethyl esters  1 g Oral Daily  . spironolactone  25 mg Oral Daily  . tamsulosin  0.4 mg Oral Daily  . warfarin  4 mg Oral ONCE-1800  . Warfarin - Pharmacist Dosing Inpatient   Does not apply q1800   Continuous Infusions:  Antibiotics Given (last 72 hours)   None      Principal Problem:   Dizziness Active Problems:   Alcoholic cardiomyopathy   PAROXYSMAL ATRIAL FIBRILLATION   BRADYCARDIA   Near syncope   Precordial pain    Time spent: 45min    Nita Sells  [facility] Pager [phone]. If 7PM-7AM, please contact night-coverage at [email or web address], password [facility] 03/20/2014, 4:38 PM  LOS: 3 days

## 2014-03-20 NOTE — Progress Notes (Signed)
Received call from [facility] on 2W concerning arranging OP PT. Patient is being discharge this evening, ED CM will forward information to Daytime CM to arrange with patient. Verified best contact number as [phone]. CM will follow up in the morning.

## 2014-03-20 NOTE — Progress Notes (Signed)
ANTICOAGULATION CONSULT NOTE - Follow Up Consult  Pharmacy Consult for coumadin  Indication: atrial fibrillation  No Known Allergies  Vital Signs: Temp: 97.9 F (36.6 C) (07/23 0413) Temp src: Oral (07/23 0413) BP: 159/65 mmHg (07/23 0923) Pulse Rate: 42 (07/23 0923)  Labs:  Recent Labs  03/17/14 1604 03/17/14 2220 03/18/14 0415 03/19/14 0354 03/20/14 0220  HGB 11.4*  --  11.5*  --  11.3*  HCT 35.8*  --  36.9*  --  36.0*  PLT 224  --  217  --  216  LABPROT 25.5*  --  22.1* 20.3* 21.0*  INR 2.32*  --  1.93* 1.73* 1.81*  CREATININE 0.86  --  0.98  --  0.98  TROPONINI  --  <0.30  --   --   --     Estimated Creatinine Clearance: 73.2 ml/min (by C-G formula based on Cr of 0.98).  Assessment: 78 yo who was admitted for vertigo. He has been on coumadin for afib. INR on admission was therapeutic. Coumadin has been ordered to cont here. INR up to 1.81 today. No documented bleeding.  Goal of Therapy:  INR 2-3 Monitor platelets by anticoagulation protocol: Yes   Plan:  1. Coumadin 4mg  PO x1 2. Daily INR  Donna Silverman J 03/20/2014,9:49 AM

## 2014-03-20 NOTE — Progress Notes (Signed)
Reviewed nuc results with Dr. Percival Spanish - felt to be low risk at this time - scar but no ischemia; EF correlates with echo. Spoke with Dr. Verlon Au who will relay result to patient. No further cardiac workup planned at present time. I left message on our after-hours voicemail to reschedule his Coumadin clinic appointment from today to Monday or Tuesday. I also requested a f/u with Dr. Meda Coffee - our office will call the patient with these appointments.  Dayna Dunn PA-C

## 2014-03-20 NOTE — Progress Notes (Signed)
OT Cancellation Note  Patient Details Name: Vincent [name] MRN: [id number] DOB: 1936-04-13   Cancelled Treatment:    Reason Eval/Treat Not Completed: OT screened, no needs identified, will sign off (Vestibular evaluation addressed by PT.)  Malka So 03/20/2014, 8:05 AM

## 2014-03-20 NOTE — Progress Notes (Signed)
Late entry for missed g code 03/19/14:  04-11-14 1300  PT G-Codes **NOT FOR INPATIENT CLASS**  Functional Assessment Tool Used clinical judgment  Functional Limitation Mobility: Walking and moving around  Mobility: Walking and Moving Around Current Status [phone]) CI  Mobility: Walking and Moving Around Goal Status [phone]) Elgin Ingold,PT Acute Rehabilitation [phone] (pager)

## 2014-03-20 NOTE — Progress Notes (Signed)
Went over discharge instructions with patient and follow up appointments. CM to follow up with patient over the phone about [facility]. Patient had no additional questions or concerns related to discharge. IV discontinued, patient taken off cardiac monitor. Discharged home. Roxan Hockey, RN

## 2014-03-21 NOTE — Telephone Encounter (Signed)
LM on his VM to call me back to discuss refill.

## 2014-03-21 NOTE — Care Management Note (Signed)
    Page 1 of 1   03/21/2014     8:04:29 AM CARE MANAGEMENT NOTE 03/21/2014  Patient:  Vincent [name], Vincent [name]   Account Number:  [id number]  Date Initiated:  03/21/2014  Documentation initiated by:  Elissa Hefty  Subjective/Objective Assessment:   adm w dizziness     Action/Plan:   lives alone, supp nephew   Anticipated DC Date:  03/20/2014   Anticipated DC Plan:  [place]  CM consult      Choice offered to / List presented to:          [facility] arranged  [place] - 11 Patient Refused      Status of service:   Medicare Important Message given?   (If response is "NO", the following Medicare IM given date fields will be blank) Date Medicare IM given:   Medicare IM given by:   Date Additional Medicare IM given:   Additional Medicare IM given by:    Discharge Disposition:  HOME/SELF CARE  Per UR Regulation:  Reviewed for med. necessity/level of care/duration of stay  If discussed at [place] of Stay Meetings, dates discussed:    Comments:  LATE ENTRY: 7/24 0802 debbie Micha Erck rn,bsn called pt's home. spoke w nephew. nephew states pt has gone to work. i spoke w fam about phy ther and they refused for hhc or for outpt phy ther.

## 2014-03-31 ENCOUNTER — Ambulatory Visit (INDEPENDENT_AMBULATORY_CARE_PROVIDER_SITE_OTHER): Payer: Medicare Other | Admitting: Pharmacist

## 2014-03-31 DIAGNOSIS — I4891 Unspecified atrial fibrillation: Secondary | ICD-10-CM

## 2014-03-31 DIAGNOSIS — Z7901 Long term (current) use of anticoagulants: Secondary | ICD-10-CM

## 2014-03-31 DIAGNOSIS — Z5181 Encounter for therapeutic drug level monitoring: Secondary | ICD-10-CM

## 2014-03-31 DIAGNOSIS — I6350 Cerebral infarction due to unspecified occlusion or stenosis of unspecified cerebral artery: Secondary | ICD-10-CM

## 2014-03-31 LAB — POCT INR: INR: 1.5

## 2014-04-09 ENCOUNTER — Ambulatory Visit (INDEPENDENT_AMBULATORY_CARE_PROVIDER_SITE_OTHER): Payer: Medicare Other | Admitting: Pharmacist

## 2014-04-09 DIAGNOSIS — I6350 Cerebral infarction due to unspecified occlusion or stenosis of unspecified cerebral artery: Secondary | ICD-10-CM

## 2014-04-09 DIAGNOSIS — I4891 Unspecified atrial fibrillation: Secondary | ICD-10-CM

## 2014-04-09 DIAGNOSIS — Z7901 Long term (current) use of anticoagulants: Secondary | ICD-10-CM

## 2014-04-09 DIAGNOSIS — Z5181 Encounter for therapeutic drug level monitoring: Secondary | ICD-10-CM

## 2014-04-09 LAB — POCT INR: INR: 2.8

## 2014-04-21 ENCOUNTER — Encounter: Payer: Self-pay | Admitting: Cardiology

## 2014-04-21 ENCOUNTER — Ambulatory Visit (INDEPENDENT_AMBULATORY_CARE_PROVIDER_SITE_OTHER): Payer: Medicare Other | Admitting: Cardiology

## 2014-04-21 VITALS — BP 154/80 | HR 72 | Ht 69.0 in | Wt 208.0 lb

## 2014-04-21 DIAGNOSIS — R42 Dizziness and giddiness: Secondary | ICD-10-CM

## 2014-04-21 MED ORDER — CARVEDILOL 6.25 MG PO TABS: 6.2500 mg | ORAL_TABLET | Freq: Two times a day (BID) | ORAL | Status: DC

## 2014-04-21 NOTE — Patient Instructions (Addendum)
Your physician has recommended you make the following change in your medication:  1. Start Coreg 6.25 mg 1 tablet by mouth twice a day.   You have been referred to see Dr. Thornell Mule at [facility] or Dr. Erik Obey at [facility].  Your physician recommends that you schedule a follow-up appointment in: 3 month with Dr. Meda Coffee.

## 2014-04-21 NOTE — Progress Notes (Signed)
Patient ID: TAAHIR GRISBY, male   DOB: 1935-11-18, 78 y.o.   MRN: [id number]    Patient Name: Vincent [name] Date of Encounter: 04/21/2014  Primary Care Provider:  [name], MD Primary Cardiologist:  [name]  Problem List   Past Medical History  Diagnosis Date  . Hypertension   . Stroke   . Syncope and collapse   . Hyperlipidemia   . Coronary artery disease   . Atrial fibrillation   . V-tach   . Bradycardia   . Alcoholic cardiomyopathy   . Pulmonary eosinophilia   . Unspecified glaucoma   . GERD (gastroesophageal reflux disease)   . Long term (current) use of anticoagulants    Past Surgical History  Procedure Laterality Date  . Loop recorder  09/2007  . Knee surgery      Allergies  No Known Allergies  HPI  Vincent [name] returns today for evaluation and management of his complex cardiovascular history, ischemic and nonischemic cardiomyopathy with prior PCI and previously depressed but now normal left ventricular function by echo in 2009.  He is also seen in the devise clinic for implantation of a loop recorder in February 2009 due to spells of dizziness described as presyncopal versus partial seizures. He has had recurrent episodes while his ILR was functioning and there were no arrhythmias documented correlating to his symptoms. His ILR was deemed non-functioning back in April 2013 due to battery depletion as it was implanted in 2009.   The patient was admitted in July 2015 for dizziness/near-syncope, etiology unclear.  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 He was not able to get ENT referral yet.  -  2D ECHO done 7/21 = EF 40-45% with diffuse hypokinesis. Nuclear stress test showed an old MI but no ischemia.  He reports compliance with his medications. No CP or SOB. His sister (who is also my patient) states that they recently lost  their sister, father and a niece and that the patient seems depressed. He denies and doesn't want to be treated for it.  Home Medications  Prior to Admission medications   Medication Sig Start Date End Date Taking? Authorizing Provider  aspirin 81 MG chewable tablet Chew 81 mg by mouth daily.   Yes Historical Provider, MD  atorvastatin (LIPITOR) 40 MG tablet Take 40 mg by mouth daily at 6 PM.   Yes Historical Provider, MD  bimatoprost (LUMIGAN) 0.03 % ophthalmic solution Place 1 drop into both eyes at bedtime.    Yes Historical Provider, MD  brimonidine (ALPHAGAN P) 0.1 % SOLN Place 1 drop into the right eye 2 (two) times daily.    Yes Historical Provider, MD  colchicine 0.6 MG tablet Take 0.6 mg by mouth daily.    Yes Historical Provider, MD  famotidine (PEPCID) 20 MG tablet Take 20 mg by mouth 2 (two) times daily.   Yes Historical Provider, MD  ferrous sulfate 325 (65 FE) MG tablet Take 325 mg by mouth daily with breakfast.   Yes Historical Provider, MD  hydrocortisone (ANUSOL-HC) 2.5 % rectal cream Place 1 application rectally 2 (two) times daily as needed for hemorrhoids or itching.   Yes Historical Provider, MD  hydrocortisone cream 1 % Apply 1 application topically 2 (two) times daily as needed (for eczmea).   Yes Historical Provider, MD  irbesartan (AVAPRO) 300 MG tablet Take 300 mg by mouth daily.   Yes Historical  Provider, MD  loratadine (CLARITIN) 10 MG tablet Take 10 mg by mouth daily.     Yes Historical Provider, MD  meclizine (ANTIVERT) 25 MG tablet Take 25 mg by mouth 3 (three) times daily as needed for dizziness.   Yes Historical Provider, MD  nitroGLYCERIN (NITROLINGUAL) 0.4 MG/SPRAY spray Place 1 spray under the tongue every 5 (five) minutes x 3 doses as needed for chest pain.   Yes Historical Provider, MD  Omega-3 Fatty Acids (FISH OIL) 1000 MG CAPS Take 1,000 mg by mouth daily.   Yes Historical Provider, MD  spironolactone (ALDACTONE) 25 MG tablet Take 25 mg by mouth daily.    Yes Historical Provider, MD  Tamsulosin HCl (FLOMAX) 0.4 MG CAPS Take 0.4 mg by mouth daily. 12/28/11  Yes Historical Provider, MD  warfarin (COUMADIN) 2 MG tablet Take 2-4 mg by mouth daily. *takes 4mg  every day except for 2mg  on mondays and fridays*   Yes Historical Provider, MD    Family History  Family History  Problem Relation Age of Onset  . Heart attack Neg Hx   . Stroke Neg Hx   . Colon cancer Mother   . Prostate cancer Brother   . Heart disease Father     Social History  History   Social History  . Marital Status: Divorced    Spouse Name: N/A    Number of Children: N/A  . Years of Education: N/A   Occupational History  . Not on file.   Social History Main Topics  . Smoking status: Former Smoker    Quit date: 08/22/1983  . Smokeless tobacco: Not on file  . Alcohol Use: No  . Drug Use: No  . Sexual Activity: Not on file   Other Topics Concern  . Not on file   Social History Narrative  . No narrative on file     Review of Systems, as per HPI, otherwise negative General:  No chills, fever, night sweats or weight changes.  Cardiovascular:  No chest pain, dyspnea on exertion, edema, orthopnea, palpitations, paroxysmal nocturnal dyspnea. Dermatological: No rash, lesions/masses Respiratory: No cough, dyspnea Urologic: No hematuria, dysuria Abdominal:   No nausea, vomiting, diarrhea, bright red blood per rectum, melena, or hematemesis Neurologic:  No visual changes, wkns, changes in mental status. All other systems reviewed and are otherwise negative except as noted above.  Physical Exam  Blood pressure 154/80, pulse 72, height 5\' 9"  (1.753 m), weight 208 lb (94.348 kg).  General: Pleasant, NAD Psych: Normal affect. Neuro: Alert and oriented X 3. Moves all extremities spontaneously. HEENT: Normal  Neck: Supple without bruits or JVD. Lungs:  Resp regular and unlabored, CTA. Heart: RRR no s3, s4, or murmurs. Abdomen: Soft, non-tender, non-distended, BS + x  4.  Extremities: No clubbing, cyanosis or edema. DP/PT/Radials 2+ and equal bilaterally.  Labs:  No results found for this basename: CKTOTAL, CKMB, TROPONINI,  in the last 72 hours Lab Results  Component Value Date   WBC 5.1 03/20/2014   HGB 11.3* 03/20/2014   HCT 36.0* 03/20/2014   MCV 84.3 03/20/2014   PLT 216 03/20/2014    No results found for this basename: DDIMER   No components found with this basename: POCBNP,     Component Value Date/Time   NA 139 03/20/2014 0220   K 4.4 03/20/2014 0220   CL 102 03/20/2014 0220   CO2 27 03/20/2014 0220   GLUCOSE 138* 03/20/2014 0220   BUN 15 03/20/2014 0220   CREATININE 0.98 03/20/2014 0220  CALCIUM 8.9 03/20/2014 0220   PROT 6.9 03/20/2014 0220   ALBUMIN 3.4* 03/20/2014 0220   AST 23 03/20/2014 0220   ALT 22 03/20/2014 0220   ALKPHOS 82 03/20/2014 0220   BILITOT 0.6 03/20/2014 0220   GFRNONAA 78* 03/20/2014 0220   GFRAA >90 03/20/2014 0220   Lab Results  Component Value Date   CHOL 152 04/12/2011   HDL 55.40 04/12/2011   LDLCALC 75 04/12/2011   TRIG 106.0 04/12/2011    Accessory Clinical Findings  Echocardiogram - 03/18/14 Left ventricle: The cavity size was normal. Wall thickness was increased in a pattern of mild LVH. Systolic function was mildly to moderately reduced. The estimated ejection fraction was in the range of 40% to 45%. Diffuse hypokinesis. The transmitral flow pattern was normal. The deceleration time of the early transmitral flow velocity was normal. The pulmonary vein flow pattern was normal. The tissue Doppler parameters were normal. Left ventricular diastolic function parameters were normal.  Lexiscan nuclear stress test 03/20/14 IMPRESSION:  1. Inferior wall reduced activity on stress and rest images,  favoring scar. No inducible ischemia identified. Apical thinning.  2. Mild to moderate hypokinesis and poor wall thickening in the  septum and inferior wall. Left ventricular ejection fraction 46%.     Assessment/Plan:  1. 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 Angina Negative stress test on 7/23  3. P.Afib -rate controlled, continue Coumadin , INR 1.81 Pharmacy dosing  4. H/o alcoholic cardiomyopathy -stable  5. HTN -continue Avapro/aldactone, add carvedilol 6.25 mg po bid  Follow up in 3 months     [name], MD, [facility] 04/21/2014, 9:41 AM

## 2014-04-21 NOTE — Addendum Note (Signed)
Addended byUlla Potash H on: 04/21/2014 10:14 AM   Modules accepted: Orders

## 2014-04-30 ENCOUNTER — Ambulatory Visit (INDEPENDENT_AMBULATORY_CARE_PROVIDER_SITE_OTHER): Payer: Medicare Other | Admitting: Pharmacist

## 2014-04-30 DIAGNOSIS — Z5181 Encounter for therapeutic drug level monitoring: Secondary | ICD-10-CM

## 2014-04-30 DIAGNOSIS — I4891 Unspecified atrial fibrillation: Secondary | ICD-10-CM

## 2014-04-30 DIAGNOSIS — I6350 Cerebral infarction due to unspecified occlusion or stenosis of unspecified cerebral artery: Secondary | ICD-10-CM

## 2014-04-30 DIAGNOSIS — Z7901 Long term (current) use of anticoagulants: Secondary | ICD-10-CM

## 2014-04-30 LAB — POCT INR: INR: 3.4

## 2014-05-21 ENCOUNTER — Ambulatory Visit (INDEPENDENT_AMBULATORY_CARE_PROVIDER_SITE_OTHER): Payer: Medicare Other | Admitting: Surgery

## 2014-05-21 DIAGNOSIS — I4891 Unspecified atrial fibrillation: Secondary | ICD-10-CM

## 2014-05-21 DIAGNOSIS — Z5181 Encounter for therapeutic drug level monitoring: Secondary | ICD-10-CM

## 2014-05-21 DIAGNOSIS — I6350 Cerebral infarction due to unspecified occlusion or stenosis of unspecified cerebral artery: Secondary | ICD-10-CM

## 2014-05-21 DIAGNOSIS — Z7901 Long term (current) use of anticoagulants: Secondary | ICD-10-CM

## 2014-05-21 LAB — POCT INR: INR: 2.5

## 2014-05-22 ENCOUNTER — Other Ambulatory Visit: Payer: Self-pay | Admitting: Cardiology

## 2014-06-18 ENCOUNTER — Ambulatory Visit (INDEPENDENT_AMBULATORY_CARE_PROVIDER_SITE_OTHER): Payer: Medicare Other | Admitting: *Deleted

## 2014-06-18 DIAGNOSIS — Z7901 Long term (current) use of anticoagulants: Secondary | ICD-10-CM

## 2014-06-18 DIAGNOSIS — I639 Cerebral infarction, unspecified: Secondary | ICD-10-CM

## 2014-06-18 DIAGNOSIS — I4891 Unspecified atrial fibrillation: Secondary | ICD-10-CM

## 2014-06-18 DIAGNOSIS — Z5181 Encounter for therapeutic drug level monitoring: Secondary | ICD-10-CM

## 2014-06-18 DIAGNOSIS — I6350 Cerebral infarction due to unspecified occlusion or stenosis of unspecified cerebral artery: Secondary | ICD-10-CM

## 2014-06-18 LAB — POCT INR: INR: 2

## 2014-06-22 ENCOUNTER — Other Ambulatory Visit: Payer: Self-pay | Admitting: Cardiology

## 2014-07-16 ENCOUNTER — Encounter: Payer: Self-pay | Admitting: Cardiology

## 2014-07-16 ENCOUNTER — Ambulatory Visit (INDEPENDENT_AMBULATORY_CARE_PROVIDER_SITE_OTHER): Payer: Medicare Other | Admitting: *Deleted

## 2014-07-16 ENCOUNTER — Ambulatory Visit (INDEPENDENT_AMBULATORY_CARE_PROVIDER_SITE_OTHER): Payer: Medicare Other | Admitting: Cardiology

## 2014-07-16 VITALS — BP 130/83 | HR 75 | Ht 69.0 in | Wt 208.0 lb

## 2014-07-16 DIAGNOSIS — Z5181 Encounter for therapeutic drug level monitoring: Secondary | ICD-10-CM

## 2014-07-16 DIAGNOSIS — I639 Cerebral infarction, unspecified: Secondary | ICD-10-CM

## 2014-07-16 DIAGNOSIS — Z7901 Long term (current) use of anticoagulants: Secondary | ICD-10-CM

## 2014-07-16 DIAGNOSIS — I4891 Unspecified atrial fibrillation: Secondary | ICD-10-CM

## 2014-07-16 DIAGNOSIS — I6350 Cerebral infarction due to unspecified occlusion or stenosis of unspecified cerebral artery: Secondary | ICD-10-CM

## 2014-07-16 LAB — POCT INR: INR: 3.5

## 2014-07-16 NOTE — Progress Notes (Signed)
Patient ID: [name], male   DOB: 28-Dec-1935, 78 y.o.   MRN: [id number]    Patient Name: Vincent [name] Date of Encounter: 07/16/2014  Primary Care Provider:  [name], MD Primary Cardiologist:  [name]  Problem List   Past Medical History  Diagnosis Date  . Hypertension   . Stroke   . Syncope and collapse   . Hyperlipidemia   . Coronary artery disease   . Atrial fibrillation   . V-tach   . Bradycardia   . Alcoholic cardiomyopathy   . Pulmonary eosinophilia   . Unspecified glaucoma   . GERD (gastroesophageal reflux disease)   . Long term (current) use of anticoagulants    Past Surgical History  Procedure Laterality Date  . Loop recorder  09/2007  . Knee surgery      Allergies  No Known Allergies  HPI  Vincent [name] returns today for evaluation and management of his complex cardiovascular history, ischemic and nonischemic cardiomyopathy with prior PCI and previously depressed but now normal left ventricular function by echo in 2009.  He is also seen in the devise clinic for implantation of a loop recorder in February 2009 due to spells of dizziness described as presyncopal versus partial seizures. He has had recurrent episodes while his ILR was functioning and there were no arrhythmias documented correlating to his symptoms. His ILR was deemed non-functioning back in April 2013 due to battery depletion as it was implanted in 2009.   The patient was admitted in July 2015 for dizziness/near-syncope, etiology unclear.  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 He was not able to get ENT referral yet.  -  2D ECHO done 7/21 = EF 40-45% with diffuse hypokinesis. Nuclear stress test showed an old MI but no ischemia.  He reports compliance with his medications. No CP or SOB. His sister (who is also my patient) states that they recently lost  their sister, father and a niece and that the patient seems depressed. He denies and doesn't want to be treated for it.  07/16/2014 - no chest pain or SOB, active, no DOE, no claudications. Has to use NTG spray every 2-3 months, stable. No PND, orthopnea or LE edema. Stable vertigo sec to Meniere's disease.   Home Medications  Prior to Admission medications   Medication Sig Start Date End Date Taking? Authorizing Provider  aspirin 81 MG chewable tablet Chew 81 mg by mouth daily.   Yes Historical Provider, MD  atorvastatin (LIPITOR) 40 MG tablet Take 40 mg by mouth daily at 6 PM.   Yes Historical Provider, MD  bimatoprost (LUMIGAN) 0.03 % ophthalmic solution Place 1 drop into both eyes at bedtime.    Yes Historical Provider, MD  brimonidine (ALPHAGAN P) 0.1 % SOLN Place 1 drop into the right eye 2 (two) times daily.    Yes Historical Provider, MD  colchicine 0.6 MG tablet Take 0.6 mg by mouth daily.    Yes Historical Provider, MD  famotidine (PEPCID) 20 MG tablet Take 20 mg by mouth 2 (two) times daily.   Yes Historical Provider, MD  ferrous sulfate 325 (65 FE) MG tablet Take 325 mg by mouth daily with breakfast.   Yes Historical Provider, MD  hydrocortisone (ANUSOL-HC) 2.5 % rectal cream Place 1 application rectally 2 (two) times daily as needed for hemorrhoids or itching.   Yes Historical Provider, MD  hydrocortisone cream 1 %  Apply 1 application topically 2 (two) times daily as needed (for eczmea).   Yes Historical Provider, MD  irbesartan (AVAPRO) 300 MG tablet Take 300 mg by mouth daily.   Yes Historical Provider, MD  loratadine (CLARITIN) 10 MG tablet Take 10 mg by mouth daily.     Yes Historical Provider, MD  meclizine (ANTIVERT) 25 MG tablet Take 25 mg by mouth 3 (three) times daily as needed for dizziness.   Yes Historical Provider, MD  nitroGLYCERIN (NITROLINGUAL) 0.4 MG/SPRAY spray Place 1 spray under the tongue every 5 (five) minutes x 3 doses as needed for chest pain.   Yes  Historical Provider, MD  Omega-3 Fatty Acids (FISH OIL) 1000 MG CAPS Take 1,000 mg by mouth daily.   Yes Historical Provider, MD  spironolactone (ALDACTONE) 25 MG tablet Take 25 mg by mouth daily.   Yes Historical Provider, MD  Tamsulosin HCl (FLOMAX) 0.4 MG CAPS Take 0.4 mg by mouth daily. 12/28/11  Yes Historical Provider, MD  warfarin (COUMADIN) 2 MG tablet Take 2-4 mg by mouth daily. *takes 4mg  every day except for 2mg  on mondays and fridays*   Yes Historical Provider, MD    Family History  Family History  Problem Relation Age of Onset  . Stroke Neg Hx   . Colon cancer Mother   . Prostate cancer Brother   . Heart disease Father   . Heart attack Father   . Heart attack Sister   . Hypertension Mother   . Hypertension Father   . Cancer Sister   . Cancer Brother   . Cancer Mother     Social History  History   Social History  . Marital Status: Divorced    Spouse Name: N/A    Number of Children: N/A  . Years of Education: N/A   Occupational History  . Not on file.   Social History Main Topics  . Smoking status: Former Smoker    Quit date: 08/22/1983  . Smokeless tobacco: Not on file  . Alcohol Use: No  . Drug Use: No  . Sexual Activity: Not on file   Other Topics Concern  . Not on file   Social History Narrative     Review of Systems, as per HPI, otherwise negative General:  No chills, fever, night sweats or weight changes.  Cardiovascular:  No chest pain, dyspnea on exertion, edema, orthopnea, palpitations, paroxysmal nocturnal dyspnea. Dermatological: No rash, lesions/masses Respiratory: No cough, dyspnea Urologic: No hematuria, dysuria Abdominal:   No nausea, vomiting, diarrhea, bright red blood per rectum, melena, or hematemesis Neurologic:  No visual changes, wkns, changes in mental status. All other systems reviewed and are otherwise negative except as noted above.  Physical Exam  Blood pressure 130/83, pulse 75, height 5\' 9"  (1.753 m), weight 208 lb  (94.348 kg).  General: Pleasant, NAD Psych: Normal affect. Neuro: Alert and oriented X 3. Moves all extremities spontaneously. HEENT: Normal  Neck: Supple without bruits or JVD. Lungs:  Resp regular and unlabored, CTA. Heart: RRR no s3, s4, or murmurs. Abdomen: Soft, non-tender, non-distended, BS + x 4.  Extremities: No clubbing, cyanosis or edema. DP/PT/Radials 2+ and equal bilaterally.  Labs:  No results for input(s): CKTOTAL, CKMB, TROPONINI in the last 72 hours. Lab Results  Component Value Date   WBC 5.1 03/20/2014   HGB 11.3* 03/20/2014   HCT 36.0* 03/20/2014   MCV 84.3 03/20/2014   PLT 216 03/20/2014    No results found for: DDIMER Invalid input(s): POCBNP  Component Value Date/Time   NA 139 03/20/2014 0220   K 4.4 03/20/2014 0220   CL 102 03/20/2014 0220   CO2 27 03/20/2014 0220   GLUCOSE 138* 03/20/2014 0220   BUN 15 03/20/2014 0220   CREATININE 0.98 03/20/2014 0220   CALCIUM 8.9 03/20/2014 0220   PROT 6.9 03/20/2014 0220   ALBUMIN 3.4* 03/20/2014 0220   AST 23 03/20/2014 0220   ALT 22 03/20/2014 0220   ALKPHOS 82 03/20/2014 0220   BILITOT 0.6 03/20/2014 0220   GFRNONAA 78* 03/20/2014 0220   GFRAA >90 03/20/2014 0220   Lab Results  Component Value Date   CHOL 152 04/12/2011   HDL 55.40 04/12/2011   LDLCALC 75 04/12/2011   TRIG 106.0 04/12/2011    Accessory Clinical Findings  Echocardiogram - 03/18/14 Left ventricle: The cavity size was normal. Wall thickness was increased in a pattern of mild LVH. Systolic function was mildly to moderately reduced. The estimated ejection fraction was in the range of 40% to 45%. Diffuse hypokinesis. The transmitral flow pattern was normal. The deceleration time of the early transmitral flow velocity was normal. The pulmonary vein flow pattern was normal. The tissue Doppler parameters were normal. Left ventricular diastolic function parameters were normal.  Lexiscan nuclear stress test 03/20/14 IMPRESSION:  1.  Inferior wall reduced activity on stress and rest images,  favoring scar. No inducible ischemia identified. Apical thinning.  2. Mild to moderate hypokinesis and poor wall thickening in the  septum and inferior wall. Left ventricular ejection fraction 46%.     Assessment/Plan:  1. Dizziness/Near Syncope -sec to Meniere's disease - had long term symptoms for several years until 1 year back--Vestib rehabilitation 7/22 revealed right vestibular hypofunction in addition patient had MRI 7/22 showing chronic right MCA infarct--needs Vestib Rehab as OP - Loop recorder 2009 -  2D ECHO done 7/21 = EF 40-45% with diffuse hypokinesis -tele with some sinus bradycardia - follows with ENT  2. Stable Angina Negative stress test on 7/23, stable symptoms relieved by NTG  3. P.Afib -rate controlled, continue Coumadin, Pharmacy dosing  4. H/o alcoholic cardiomyopathy -stable  5. HTN -continue Avapro/aldactone, add carvedilol 6.25 mg po bid  Follow up in 1 year   [name], MD, [facility] 07/16/2014, 10:58 AM

## 2014-07-16 NOTE — Patient Instructions (Signed)
Your physician recommends that you continue on your current medications as directed. Please refer to the Current Medication list given to you today.   Your physician wants you to follow-up in: ONE YEAR WITH DR NELSON You will receive a reminder letter in the mail two months in advance. If you don't receive a letter, please call our office to schedule the follow-up appointment.  

## 2014-07-29 ENCOUNTER — Other Ambulatory Visit: Payer: Self-pay | Admitting: Cardiology

## 2014-08-12 ENCOUNTER — Ambulatory Visit (INDEPENDENT_AMBULATORY_CARE_PROVIDER_SITE_OTHER): Payer: Medicare Other | Admitting: Pharmacist

## 2014-08-12 DIAGNOSIS — I4891 Unspecified atrial fibrillation: Secondary | ICD-10-CM

## 2014-08-12 DIAGNOSIS — Z5181 Encounter for therapeutic drug level monitoring: Secondary | ICD-10-CM

## 2014-08-12 DIAGNOSIS — I6350 Cerebral infarction due to unspecified occlusion or stenosis of unspecified cerebral artery: Secondary | ICD-10-CM

## 2014-08-12 DIAGNOSIS — Z7901 Long term (current) use of anticoagulants: Secondary | ICD-10-CM

## 2014-08-12 DIAGNOSIS — I639 Cerebral infarction, unspecified: Secondary | ICD-10-CM

## 2014-08-12 LAB — POCT INR: INR: 3.4

## 2014-08-14 ENCOUNTER — Other Ambulatory Visit: Payer: Self-pay | Admitting: Cardiology

## 2014-09-03 ENCOUNTER — Ambulatory Visit (INDEPENDENT_AMBULATORY_CARE_PROVIDER_SITE_OTHER): Payer: Medicare Other | Admitting: *Deleted

## 2014-09-03 DIAGNOSIS — I4891 Unspecified atrial fibrillation: Secondary | ICD-10-CM

## 2014-09-03 DIAGNOSIS — I639 Cerebral infarction, unspecified: Secondary | ICD-10-CM

## 2014-09-03 DIAGNOSIS — Z7901 Long term (current) use of anticoagulants: Secondary | ICD-10-CM

## 2014-09-03 DIAGNOSIS — I6350 Cerebral infarction due to unspecified occlusion or stenosis of unspecified cerebral artery: Secondary | ICD-10-CM

## 2014-09-03 DIAGNOSIS — Z5181 Encounter for therapeutic drug level monitoring: Secondary | ICD-10-CM

## 2014-09-03 LAB — POCT INR: INR: 3

## 2014-09-24 ENCOUNTER — Ambulatory Visit (INDEPENDENT_AMBULATORY_CARE_PROVIDER_SITE_OTHER): Payer: Medicare Other | Admitting: *Deleted

## 2014-09-24 DIAGNOSIS — Z7901 Long term (current) use of anticoagulants: Secondary | ICD-10-CM

## 2014-09-24 DIAGNOSIS — Z5181 Encounter for therapeutic drug level monitoring: Secondary | ICD-10-CM

## 2014-09-24 DIAGNOSIS — I6350 Cerebral infarction due to unspecified occlusion or stenosis of unspecified cerebral artery: Secondary | ICD-10-CM

## 2014-09-24 DIAGNOSIS — I639 Cerebral infarction, unspecified: Secondary | ICD-10-CM

## 2014-09-24 DIAGNOSIS — I4891 Unspecified atrial fibrillation: Secondary | ICD-10-CM

## 2014-09-24 LAB — POCT INR: INR: 1.6

## 2014-09-26 ENCOUNTER — Other Ambulatory Visit: Payer: Self-pay | Admitting: Cardiology

## 2014-10-16 ENCOUNTER — Ambulatory Visit (INDEPENDENT_AMBULATORY_CARE_PROVIDER_SITE_OTHER): Payer: Medicare Other | Admitting: *Deleted

## 2014-10-16 DIAGNOSIS — I639 Cerebral infarction, unspecified: Secondary | ICD-10-CM

## 2014-10-16 DIAGNOSIS — I4891 Unspecified atrial fibrillation: Secondary | ICD-10-CM

## 2014-10-16 DIAGNOSIS — Z5181 Encounter for therapeutic drug level monitoring: Secondary | ICD-10-CM

## 2014-10-16 DIAGNOSIS — I6350 Cerebral infarction due to unspecified occlusion or stenosis of unspecified cerebral artery: Secondary | ICD-10-CM

## 2014-10-16 DIAGNOSIS — Z7901 Long term (current) use of anticoagulants: Secondary | ICD-10-CM

## 2014-10-16 LAB — POCT INR: INR: 2.9

## 2014-11-06 ENCOUNTER — Ambulatory Visit (INDEPENDENT_AMBULATORY_CARE_PROVIDER_SITE_OTHER): Payer: Medicare Other | Admitting: *Deleted

## 2014-11-06 DIAGNOSIS — I4891 Unspecified atrial fibrillation: Secondary | ICD-10-CM

## 2014-11-06 DIAGNOSIS — I639 Cerebral infarction, unspecified: Secondary | ICD-10-CM

## 2014-11-06 DIAGNOSIS — Z5181 Encounter for therapeutic drug level monitoring: Secondary | ICD-10-CM

## 2014-11-06 DIAGNOSIS — Z7901 Long term (current) use of anticoagulants: Secondary | ICD-10-CM | POA: Diagnosis not present

## 2014-11-06 DIAGNOSIS — I6350 Cerebral infarction due to unspecified occlusion or stenosis of unspecified cerebral artery: Secondary | ICD-10-CM

## 2014-11-06 LAB — POCT INR: INR: 2.9

## 2014-12-05 ENCOUNTER — Ambulatory Visit (INDEPENDENT_AMBULATORY_CARE_PROVIDER_SITE_OTHER): Payer: Medicare Other

## 2014-12-05 DIAGNOSIS — Z5181 Encounter for therapeutic drug level monitoring: Secondary | ICD-10-CM

## 2014-12-05 DIAGNOSIS — Z7901 Long term (current) use of anticoagulants: Secondary | ICD-10-CM

## 2014-12-05 DIAGNOSIS — I639 Cerebral infarction, unspecified: Secondary | ICD-10-CM

## 2014-12-05 DIAGNOSIS — I6350 Cerebral infarction due to unspecified occlusion or stenosis of unspecified cerebral artery: Secondary | ICD-10-CM

## 2014-12-05 DIAGNOSIS — I4891 Unspecified atrial fibrillation: Secondary | ICD-10-CM | POA: Diagnosis not present

## 2014-12-05 LAB — POCT INR: INR: 2.5

## 2014-12-12 ENCOUNTER — Other Ambulatory Visit: Payer: Self-pay | Admitting: Cardiology

## 2014-12-15 ENCOUNTER — Other Ambulatory Visit: Payer: Self-pay | Admitting: Cardiology

## 2014-12-17 ENCOUNTER — Telehealth: Payer: Self-pay | Admitting: *Deleted

## 2014-12-17 NOTE — Telephone Encounter (Signed)
S/w Juzell at [employer] about pt's prior auth for nitro spray.  Formed needed to be filled out in it's entirety and re-faxed.  Sent today waiting for transmission.

## 2014-12-17 NOTE — Telephone Encounter (Signed)
Contacted pts insurance [employer] and spoke with a representative to provide additional information required to have pts nirtroglycerin 0.4 mg spray prior authorized and completed.  Per [employer] representative they will contact the pt and our office with denial or approval of this drug.

## 2014-12-17 NOTE — Telephone Encounter (Signed)
Representative from [facility] called and stated that there was one more question that needed to be answered to complete prior authorization for nitro spray. They needs to know if the patient has tried and failed any other medications related to the diagnosis. Please return call to [phone]. There was no reference number provided. Thanks, MI

## 2014-12-18 ENCOUNTER — Telehealth: Payer: Self-pay

## 2014-12-18 NOTE — Telephone Encounter (Signed)
[employer] called to let us know that patient's nitro was approval from 12/17/14 until 12/17/15

## 2015-01-08 ENCOUNTER — Encounter: Payer: Self-pay | Admitting: Gastroenterology

## 2015-01-16 ENCOUNTER — Ambulatory Visit (INDEPENDENT_AMBULATORY_CARE_PROVIDER_SITE_OTHER): Payer: Medicare Other | Admitting: *Deleted

## 2015-01-16 DIAGNOSIS — Z5181 Encounter for therapeutic drug level monitoring: Secondary | ICD-10-CM

## 2015-01-16 DIAGNOSIS — I4891 Unspecified atrial fibrillation: Secondary | ICD-10-CM

## 2015-01-16 DIAGNOSIS — I639 Cerebral infarction, unspecified: Secondary | ICD-10-CM

## 2015-01-16 DIAGNOSIS — I6350 Cerebral infarction due to unspecified occlusion or stenosis of unspecified cerebral artery: Secondary | ICD-10-CM

## 2015-01-16 DIAGNOSIS — Z7901 Long term (current) use of anticoagulants: Secondary | ICD-10-CM

## 2015-01-16 LAB — POCT INR: INR: 2.6

## 2015-02-15 ENCOUNTER — Other Ambulatory Visit: Payer: Self-pay | Admitting: Cardiology

## 2015-02-26 ENCOUNTER — Ambulatory Visit (INDEPENDENT_AMBULATORY_CARE_PROVIDER_SITE_OTHER): Payer: Medicare Other | Admitting: Surgery

## 2015-02-26 DIAGNOSIS — I639 Cerebral infarction, unspecified: Secondary | ICD-10-CM | POA: Diagnosis not present

## 2015-02-26 DIAGNOSIS — Z7901 Long term (current) use of anticoagulants: Secondary | ICD-10-CM | POA: Diagnosis not present

## 2015-02-26 DIAGNOSIS — Z5181 Encounter for therapeutic drug level monitoring: Secondary | ICD-10-CM | POA: Diagnosis not present

## 2015-02-26 DIAGNOSIS — I4891 Unspecified atrial fibrillation: Secondary | ICD-10-CM

## 2015-02-26 DIAGNOSIS — I6350 Cerebral infarction due to unspecified occlusion or stenosis of unspecified cerebral artery: Secondary | ICD-10-CM

## 2015-02-26 LAB — POCT INR: INR: 3.6

## 2015-03-05 ENCOUNTER — Emergency Department (HOSPITAL_COMMUNITY)
Admission: EM | Admit: 2015-03-05 | Discharge: 2015-03-05 | Disposition: A | Discharge status: Home / Self Care | Payer: Medicare Other | Attending: Emergency Medicine | Admitting: Emergency Medicine

## 2015-03-05 ENCOUNTER — Encounter (HOSPITAL_COMMUNITY): Payer: Self-pay | Admitting: *Deleted

## 2015-03-05 ENCOUNTER — Emergency Department (HOSPITAL_COMMUNITY): Payer: Medicare Other

## 2015-03-05 DIAGNOSIS — Z87891 Personal history of nicotine dependence: Secondary | ICD-10-CM | POA: Diagnosis not present

## 2015-03-05 DIAGNOSIS — I4891 Unspecified atrial fibrillation: Secondary | ICD-10-CM | POA: Diagnosis not present

## 2015-03-05 DIAGNOSIS — Z8249 Family history of ischemic heart disease and other diseases of the circulatory system: Secondary | ICD-10-CM | POA: Diagnosis not present

## 2015-03-05 DIAGNOSIS — Z800 Family history of malignant neoplasm of digestive organs: Secondary | ICD-10-CM | POA: Insufficient documentation

## 2015-03-05 DIAGNOSIS — K219 Gastro-esophageal reflux disease without esophagitis: Secondary | ICD-10-CM | POA: Insufficient documentation

## 2015-03-05 DIAGNOSIS — M21372 Foot drop, left foot: Secondary | ICD-10-CM

## 2015-03-05 DIAGNOSIS — Z7901 Long term (current) use of anticoagulants: Secondary | ICD-10-CM | POA: Diagnosis not present

## 2015-03-05 DIAGNOSIS — I2510 Atherosclerotic heart disease of native coronary artery without angina pectoris: Secondary | ICD-10-CM | POA: Diagnosis not present

## 2015-03-05 DIAGNOSIS — E785 Hyperlipidemia, unspecified: Secondary | ICD-10-CM | POA: Insufficient documentation

## 2015-03-05 DIAGNOSIS — G5732 Lesion of lateral popliteal nerve, left lower limb: Secondary | ICD-10-CM

## 2015-03-05 DIAGNOSIS — I10 Essential (primary) hypertension: Secondary | ICD-10-CM | POA: Diagnosis not present

## 2015-03-05 DIAGNOSIS — Z8042 Family history of malignant neoplasm of prostate: Secondary | ICD-10-CM | POA: Diagnosis not present

## 2015-03-05 DIAGNOSIS — Z8673 Personal history of transient ischemic attack (TIA), and cerebral infarction without residual deficits: Secondary | ICD-10-CM | POA: Diagnosis not present

## 2015-03-05 DIAGNOSIS — G629 Polyneuropathy, unspecified: Secondary | ICD-10-CM | POA: Diagnosis present

## 2015-03-05 DIAGNOSIS — Z823 Family history of stroke: Secondary | ICD-10-CM | POA: Diagnosis not present

## 2015-03-05 HISTORY — DX: Gout, unspecified: M10.9

## 2015-03-05 LAB — DIFFERENTIAL
Basophils Absolute: 0 10*3/uL (ref 0.0–0.1)
Basophils Relative: 1 % (ref 0–1)
Eosinophils Absolute: 0.2 10*3/uL (ref 0.0–0.7)
Eosinophils Relative: 8 % — ABNORMAL HIGH (ref 0–5)
Lymphocytes Relative: 34 % (ref 12–46)
Lymphs Abs: 0.9 10*3/uL (ref 0.7–4.0)
Monocytes Absolute: 0.4 10*3/uL (ref 0.1–1.0)
Monocytes Relative: 14 % — ABNORMAL HIGH (ref 3–12)
Neutro Abs: 1.2 10*3/uL — ABNORMAL LOW (ref 1.7–7.7)
Neutrophils Relative %: 43 % (ref 43–77)

## 2015-03-05 LAB — COMPREHENSIVE METABOLIC PANEL
ALT: 25 U/L (ref 17–63)
AST: 29 U/L (ref 15–41)
Albumin: 3.4 g/dL — ABNORMAL LOW (ref 3.5–5.0)
Alkaline Phosphatase: 64 U/L (ref 38–126)
Anion gap: 8 (ref 5–15)
BUN: 10 mg/dL (ref 6–20)
CO2: 25 mmol/L (ref 22–32)
Calcium: 8.9 mg/dL (ref 8.9–10.3)
Chloride: 106 mmol/L (ref 101–111)
Creatinine, Ser: 1.11 mg/dL (ref 0.61–1.24)
GFR calc Af Amer: >60 mL/min (ref >60)
GFR calc non Af Amer: >60 mL/min (ref >60)
Glucose, Bld: 185 mg/dL — ABNORMAL HIGH (ref 65–99)
Potassium: 3.5 mmol/L (ref 3.5–5.1)
Sodium: 139 mmol/L (ref 135–145)
Total Bilirubin: 1.4 mg/dL — ABNORMAL HIGH (ref 0.3–1.2)
Total Protein: 6.9 g/dL (ref 6.5–8.1)

## 2015-03-05 LAB — I-STAT TROPONIN, ED: Troponin i, poc: 0.01 ng/mL (ref 0.00–0.08)

## 2015-03-05 LAB — URINALYSIS, ROUTINE W REFLEX MICROSCOPIC
Bilirubin Urine: NEGATIVE
Glucose, UA: NEGATIVE mg/dL
Hgb urine dipstick: NEGATIVE
Ketones, ur: NEGATIVE mg/dL
Leukocytes, UA: NEGATIVE
Nitrite: NEGATIVE
Protein, ur: NEGATIVE mg/dL
Specific Gravity, Urine: 1.008 (ref 1.005–1.030)
Urobilinogen, UA: 0.2 mg/dL (ref 0.0–1.0)
pH: 5.5 (ref 5.0–8.0)

## 2015-03-05 LAB — PROTIME-INR
INR: 2 — ABNORMAL HIGH (ref 0.00–1.49)
Prothrombin Time: 22.5 seconds — ABNORMAL HIGH (ref 11.6–15.2)

## 2015-03-05 LAB — RAPID URINE DRUG SCREEN, HOSP PERFORMED
Amphetamines: NOT DETECTED
Barbiturates: NOT DETECTED
Benzodiazepines: NOT DETECTED
Cocaine: NOT DETECTED
Opiates: NOT DETECTED
Tetrahydrocannabinol: NOT DETECTED

## 2015-03-05 LAB — CBC
HCT: 42.1 % (ref 39.0–52.0)
Hemoglobin: 13.9 g/dL (ref 13.0–17.0)
MCH: 27.1 pg (ref 26.0–34.0)
MCHC: 33 g/dL (ref 30.0–36.0)
MCV: 82.2 fL (ref 78.0–100.0)
Platelets: 165 10*3/uL (ref 150–400)
RBC: 5.12 MIL/uL (ref 4.22–5.81)
RDW: 14.6 % (ref 11.5–15.5)
WBC: 2.8 10*3/uL — ABNORMAL LOW (ref 4.0–10.5)

## 2015-03-05 LAB — ETHANOL: Alcohol, Ethyl (B): 5 mg/dL — ABNORMAL HIGH (ref <5)

## 2015-03-05 LAB — APTT: aPTT: 36 seconds (ref 24–37)

## 2015-03-05 NOTE — ED Notes (Signed)
Spoke to [employer] for [employer] does not have one presently but there is possibility to get one with shipment at 2pm, and he will call other places for one. Alternative plan is to get Dr.s order and it can be delivered to home. RN will speak to patient and CM to find solution.

## 2015-03-05 NOTE — ED Provider Notes (Signed)
CSN: [id number]     Arrival date & time 03/05/15  0909 History   First MD Initiated Contact with Patient 03/05/15 0913     Chief Complaint  Patient presents with  . Numbness   Patient is a 79 y.o. male presenting with neurologic complaint. The history is provided by the patient.  Neurologic Problem This is a recurrent (Pt has history of an old stroke but normally does not have the symptoms he noticed this am) problem. The current episode started 6 to 12 hours ago (he last felt normal when he went to bed last night, maybe 11 pm to 12 am.  he noticed the sx when he woke up this am). The problem occurs constantly. The problem has not changed since onset.Pertinent negatives include no chest pain, no abdominal pain, no headaches and no shortness of breath. Nothing aggravates the symptoms. Nothing relieves the symptoms. He has tried nothing for the symptoms.  Pt woke up feeling that his left leg was not moving properly.  It is not completely weak but he is having trouble walking.  He denies any trouble with speech difficulties or visual changes.  He has a history of an old stroke with some residual left sided weakness but his symptoms today are new.  Past Medical History  Diagnosis Date  . Hypertension   . Stroke   . Syncope and collapse   . Hyperlipidemia   . Coronary artery disease   . Atrial fibrillation   . V-tach   . Bradycardia   . Alcoholic cardiomyopathy   . Pulmonary eosinophilia   . Unspecified glaucoma   . GERD (gastroesophageal reflux disease)   . Long term (current) use of anticoagulants   . Gout    Past Surgical History  Procedure Laterality Date  . Loop recorder  09/2007  . Knee surgery     Family History  Problem Relation Age of Onset  . Stroke Neg Hx   . Colon cancer Mother   . Prostate cancer Brother   . Heart disease Father   . Heart attack Father   . Heart attack Sister   . Hypertension Mother   . Hypertension Father   . Cancer Sister   . Cancer Brother   .  Cancer Mother    History  Substance Use Topics  . Smoking status: Former Smoker    Quit date: 08/22/1983  . Smokeless tobacco: Not on file  . Alcohol Use: No    Review of Systems  Respiratory: Negative for shortness of breath.   Cardiovascular: Negative for chest pain.  Gastrointestinal: Negative for abdominal pain.  Neurological: Negative for headaches.  All other systems reviewed and are negative.     Allergies  Review of patient's allergies indicates no known allergies.  Home Medications   Prior to Admission medications   Medication Sig Start Date End Date Taking? Authorizing Provider  aspirin 81 MG chewable tablet Chew 81 mg by mouth daily.    Historical Provider, MD  atorvastatin (LIPITOR) 40 MG tablet TAKE 1 TABLET BY MOUTH EVERY DAY 08/15/14   [name], MD  bimatoprost (LUMIGAN) 0.03 % ophthalmic solution Place 1 drop into both eyes at bedtime.     Historical Provider, MD  brimonidine (ALPHAGAN P) 0.1 % SOLN Place 1 drop into both eyes 2 (two) times daily.     Historical Provider, MD  carvedilol (COREG) 6.25 MG tablet TAKE 1 TABLET BY MOUTH TWICE DAILY 12/17/14   [name], MD  colchicine 0.6  MG tablet Take 0.6 mg by mouth daily.     Historical Provider, MD  famotidine (PEPCID) 20 MG tablet Take 20 mg by mouth 2 (two) times daily.    Historical Provider, MD  ferrous sulfate 325 (65 FE) MG tablet Take 325 mg by mouth daily with breakfast.    Historical Provider, MD  hydrocortisone (ANUSOL-HC) 2.5 % rectal cream Place 1 application rectally 2 (two) times daily as needed for hemorrhoids or itching.    Historical Provider, MD  hydrocortisone cream 1 % Apply 1 application topically 2 (two) times daily as needed (for eczmea).    Historical Provider, MD  irbesartan (AVAPRO) 300 MG tablet Take 300 mg by mouth at bedtime.     Historical Provider, MD  irbesartan (AVAPRO) 300 MG tablet TAKE 1 TABLET BY MOUTH EVERY DAY 07/30/14   [name], MD  latanoprost  (XALATAN) 0.005 % ophthalmic solution Place 1 drop into both eyes at bedtime.    Historical Provider, MD  loratadine (CLARITIN) 10 MG tablet Take 10 mg by mouth daily.      Historical Provider, MD  meclizine (ANTIVERT) 25 MG tablet Take 25 mg by mouth 3 (three) times daily as needed for dizziness.    Historical Provider, MD  nitroGLYCERIN (NITROLINGUAL) 0.4 MG/SPRAY spray Place 1 spray under the tongue every 5 (five) minutes x 3 doses as needed for chest pain.    Historical Provider, MD  nitroGLYCERIN (NITROLINGUAL) 0.4 MG/SPRAY spray PLACE 1 SPRAY UNDER THE TONGUE EVERY 5 MINUTES AS NEEDED 12/15/14   [name], MD  Omega-3 Fatty Acids (FISH OIL) 1000 MG CAPS Take 1,000 mg by mouth daily.    Historical Provider, MD  spironolactone (ALDACTONE) 25 MG tablet Take 25 mg by mouth daily.    Historical Provider, MD  spironolactone (ALDACTONE) 25 MG tablet TAKE 1 TABLET EVERY DAY 07/30/14   [name], MD  Tamsulosin HCl (FLOMAX) 0.4 MG CAPS Take 0.4 mg by mouth daily. 12/28/11   Historical Provider, MD  warfarin (COUMADIN) 2 MG tablet TAKE AS DIRECTED BY COUMADIN CLINIC 02/16/15   [name], MD   BP 142/94 mmHg  Pulse 48  Temp(Src) 98 F (36.7 C) (Oral)  Resp 19  Ht 5' 9.5" (1.765 m)  Wt 208 lb (94.348 kg)  BMI 30.29 kg/m2  SpO2 98% Physical Exam  Constitutional: He is oriented to person, place, and time. He appears well-developed and well-nourished. No distress.  HENT:  Head: Normocephalic and atraumatic.  Right Ear: External ear normal.  Left Ear: External ear normal.  Mouth/Throat: Oropharynx is clear and moist.  Eyes: Conjunctivae are normal. Right eye exhibits no discharge. Left eye exhibits no discharge. No scleral icterus.  Neck: Neck supple. No tracheal deviation present.  Cardiovascular: Normal rate and intact distal pulses.  An irregularly irregular rhythm present.  Pulmonary/Chest: Effort normal and breath sounds normal. No stridor. No respiratory distress. He has  no wheezes. He has no rales.  Abdominal: Soft. Bowel sounds are normal. He exhibits no distension. There is no tenderness. There is no rebound and no guarding.  Musculoskeletal: He exhibits no edema or tenderness.  Neurological: He is alert and oriented to person, place, and time. He has normal strength. No cranial nerve deficit (No facial droop, extraocular movements intact, tongue midline ) or sensory deficit. He exhibits normal muscle tone. He displays no seizure activity. Coordination normal.  No pronator drift bilateral upper extrem, unable to hold left legs off bed for 5 seconds, sensation  intact in all extremities but altered in left upper extremity, temporal visual field cuts left eye (old per patient), no left or right sided neglect,, no nystagmus noted   Skin: Skin is warm and dry. No rash noted.  Psychiatric: He has a normal mood and affect.  Nursing note and vitals reviewed.   ED Course  Procedures (including critical care time) Labs Review Labs Reviewed  PROTIME-INR - Abnormal; Notable for the following:    Prothrombin Time 22.5 (*)    INR 2.00 (*)    All other components within normal limits  CBC - Abnormal; Notable for the following:    WBC 2.8 (*)    All other components within normal limits  DIFFERENTIAL - Abnormal; Notable for the following:    Neutro Abs 1.2 (*)    Monocytes Relative 14 (*)    Eosinophils Relative 8 (*)    All other components within normal limits  COMPREHENSIVE METABOLIC PANEL - Abnormal; Notable for the following:    Glucose, Bld 185 (*)    Albumin 3.4 (*)    Total Bilirubin 1.4 (*)    All other components within normal limits  ETHANOL - Abnormal; Notable for the following:    Alcohol, Ethyl (B) 5 (*)    All other components within normal limits  APTT  URINE RAPID DRUG SCREEN, HOSP PERFORMED  URINALYSIS, ROUTINE W REFLEX MICROSCOPIC (NOT AT [facility])  I-STAT TROPOININ, ED    Imaging Review Ct Head Wo Contrast  03/05/2015   CLINICAL DATA:   79 year old hypertensive male with history of prior stroke and left-sided weakness presenting with numbness left arm and leg which is different than prior symptoms with onset 6-7 o'clock this morning. Subsequent encounter.  EXAM: CT HEAD WITHOUT CONTRAST  TECHNIQUE: Contiguous axial images were obtained from the base of the skull through the vertex without intravenous contrast.  COMPARISON:  03/19/2014 MR. 03/17/2014 CT.  FINDINGS: Large remote right middle cerebral artery distribution infarct with large area of encephalomalacia right temporal lobe, right frontal lobe and right parietal lobe with subsequent dilation of the right lateral ventricle.  No CT evidence of large acute infarct.  Global atrophy without hydrocephalus.  No intracranial hemorrhage.  No intracranial mass lesion noted on this unenhanced exam.  Vascular calcifications.  Minimal mucosal thickening ethmoid sinus air cells, frontal sinus and right sphenoid sinus.  IMPRESSION: Large remote right middle cerebral artery distribution infarct.  No CT evidence of large acute infarct.  Global atrophy without hydrocephalus.  No intracranial hemorrhage.   Electronically Signed   By: Genia Del M.D.   On: 03/05/2015 10:23     EKG Interpretation   Date/Time:  Thursday March 05 2015 09:22:33 EDT Ventricular Rate:  79 PR Interval:    QRS Duration: 118 QT Interval:  380 QTC Calculation: 436 R Axis:   3 Text Interpretation:  Atrial fibrillation Nonspecific intraventricular  conduction delay Borderline repolarization abnormality atrial fibrillation  has replaced sinus rhythm compared to last tracing Confirmed by Lovely Kerins   MD-J, Rechelle Niebla ([id number]) on 03/05/2015 9:31:47 AM      MDM   Final diagnoses:  Neuropathy, peroneal nerve, left    Pt was seen by the neurology team including Dr Nicole Kindred.  Feels that sx are related to a peroneal nerve neuropathy and not stroke fortunately.  Pt will need outpatient neruology follow up, PT.  PT will not require  inpatient admission, evaluation.    Dorie Rank, MD 03/05/15 [phone]

## 2015-03-05 NOTE — ED Notes (Signed)
Patient given water to attempt to provide urine sample.

## 2015-03-05 NOTE — Consult Note (Signed)
NEURO HOSPITALIST CONSULT NOTE   Referring physician: Tomi Bamberger   Reason for Consult: left foot drop  HPI:                                                                                                                                          Vincent [name] is an 79 y.o. male *with previous right MCA stroke with minimal residual symptoms, Afib on coumadin and ASA. INR 2.0.  He went to sleep at 2300 hoursl last night and felt normal.  HE notes he slept "in a funny position " last night. HE woke up and noted his left foot felt funny and he was having a hard time ambulating.  His sister noted he almost tripped walking out or the house. Due to having a previous stroke he came to the ED. Neurology was asked to evaluate.   Past Medical History  Diagnosis Date  . Hypertension   . Stroke   . Syncope and collapse   . Hyperlipidemia   . Coronary artery disease   . Atrial fibrillation   . V-tach   . Bradycardia   . Alcoholic cardiomyopathy   . Pulmonary eosinophilia   . Unspecified glaucoma   . GERD (gastroesophageal reflux disease)   . Long term (current) use of anticoagulants   . Gout     Past Surgical History  Procedure Laterality Date  . Loop recorder  09/2007  . Knee surgery      Family History  Problem Relation Age of Onset  . Stroke Neg Hx   . Colon cancer Mother   . Prostate cancer Brother   . Heart disease Father   . Heart attack Father   . Heart attack Sister   . Hypertension Mother   . Hypertension Father   . Cancer Sister   . Cancer Brother   . Cancer Mother      Social History:  reports that he quit smoking about 31 years ago. He does not have any smokeless tobacco history on file. He reports that he does not drink alcohol or use illicit drugs.  No Known Allergies  MEDICATIONS:  No current facility-administered medications for  this encounter.   Current Outpatient Prescriptions  Medication Sig Dispense Refill  . aspirin 81 MG chewable tablet Chew 81 mg by mouth daily.    Marland Kitchen atorvastatin (LIPITOR) 40 MG tablet TAKE 1 TABLET BY MOUTH EVERY DAY 30 tablet 6  . bimatoprost (LUMIGAN) 0.03 % ophthalmic solution Place 1 drop into both eyes at bedtime.     . brimonidine (ALPHAGAN P) 0.1 % SOLN Place 1 drop into both eyes 2 (two) times daily.     . carvedilol (COREG) 6.25 MG tablet TAKE 1 TABLET BY MOUTH TWICE DAILY 60 tablet 6  . colchicine 0.6 MG tablet Take 0.6 mg by mouth daily.     . famotidine (PEPCID) 20 MG tablet Take 20 mg by mouth 2 (two) times daily.    . ferrous sulfate 325 (65 FE) MG tablet Take 325 mg by mouth daily with breakfast.    . hydrocortisone (ANUSOL-HC) 2.5 % rectal cream Place 1 application rectally 2 (two) times daily as needed for hemorrhoids or itching.    . hydrocortisone cream 1 % Apply 1 application topically 2 (two) times daily as needed (for eczmea).    . irbesartan (AVAPRO) 300 MG tablet Take 300 mg by mouth at bedtime.     . irbesartan (AVAPRO) 300 MG tablet TAKE 1 TABLET BY MOUTH EVERY DAY 30 tablet 11  . latanoprost (XALATAN) 0.005 % ophthalmic solution Place 1 drop into both eyes at bedtime.    Marland Kitchen loratadine (CLARITIN) 10 MG tablet Take 10 mg by mouth daily.      . meclizine (ANTIVERT) 25 MG tablet Take 25 mg by mouth 3 (three) times daily as needed for dizziness.    . nitroGLYCERIN (NITROLINGUAL) 0.4 MG/SPRAY spray Place 1 spray under the tongue every 5 (five) minutes x 3 doses as needed for chest pain.    . nitroGLYCERIN (NITROLINGUAL) 0.4 MG/SPRAY spray PLACE 1 SPRAY UNDER THE TONGUE EVERY 5 MINUTES AS NEEDED 0.4 g 3  . Omega-3 Fatty Acids (FISH OIL) 1000 MG CAPS Take 1,000 mg by mouth daily.    Marland Kitchen spironolactone (ALDACTONE) 25 MG tablet Take 25 mg by mouth daily.    Marland Kitchen spironolactone (ALDACTONE) 25 MG tablet TAKE 1 TABLET EVERY DAY 30 tablet 11  . Tamsulosin HCl (FLOMAX) 0.4 MG CAPS Take  0.4 mg by mouth daily.    Marland Kitchen warfarin (COUMADIN) 2 MG tablet TAKE AS DIRECTED BY COUMADIN CLINIC 160 tablet 0      ROS:                                                                                                                                       History obtained from the patient  General ROS: negative for - chills, fatigue, fever, night sweats, weight gain or weight loss Psychological ROS: negative for - behavioral disorder, hallucinations, memory difficulties, mood swings or suicidal ideation Ophthalmic  ROS: negative for - blurry vision, double vision, eye pain or loss of vision ENT ROS: negative for - epistaxis, nasal discharge, oral lesions, sore throat, tinnitus or vertigo Allergy and Immunology ROS: negative for - hives or itchy/watery eyes Hematological and Lymphatic ROS: negative for - bleeding problems, bruising or swollen lymph nodes Endocrine ROS: negative for - galactorrhea, hair pattern changes, polydipsia/polyuria or temperature intolerance Respiratory ROS: negative for - cough, hemoptysis, shortness of breath or wheezing Cardiovascular ROS: negative for - chest pain, dyspnea on exertion, edema or irregular heartbeat Gastrointestinal ROS: negative for - abdominal pain, diarrhea, hematemesis, nausea/vomiting or stool incontinence Genito-Urinary ROS: negative for - dysuria, hematuria, incontinence or urinary frequency/urgency Musculoskeletal ROS: negative for - joint swelling or muscular weakness Neurological ROS: as noted in HPI Dermatological ROS: negative for rash and skin lesion changes   Blood pressure 156/93, pulse 56, temperature 98 F (36.7 C), temperature source Oral, resp. rate 18, height 5' 9.5" (1.765 m), weight 94.348 kg (208 lb), SpO2 100 %.   Neurologic Examination:                                                                                                      HEENT-  Normocephalic, no lesions, without obvious abnormality.  Normal external eye and  conjunctiva.  Normal TM's bilaterally.  Normal auditory canals and external ears. Normal external nose, mucus membranes and septum.  Normal pharynx. Cardiovascular- irregularly irregular rhythm, pulses palpable throughout   Lungs- no tachypnea, retractions or cyanosis Abdomen- normal findings: bowel sounds normal Extremities- no edema Lymph-no adenopathy palpable Musculoskeletal-no joint tenderness, deformity or swelling Skin-warm and dry, no hyperpigmentation, vitiligo, or suspicious lesions  Neurological Examination Mental Status: Alert, oriented, thought content appropriate.  Speech fluent without evidence of aphasia.  Able to follow 3 step commands without difficulty. Cranial Nerves: II: Discs flat bilaterally; Visual fields show a left lower quadrant field cut, pupils equal, round, reactive to light and accommodation III,IV, VI: ptosis not present, extra-ocular motions intact bilaterally V,VII: smile asymmetric on the left (old), facial light touch sensation normal bilaterally VIII: hearing normal bilaterally IX,X: uvula rises symmetrically XI: bilateral shoulder shrug XII: midline tongue extension Motor: Right : Upper extremity   5/5    Left:     Upper extremity   5/5  Lower extremity   5/5     Lower extremity   5/5 --he shows inability to dorsiflex and evert his left foot.  Tone and bulk:normal tone throughout; no atrophy noted Sensory: Pinprick and light touch intact throughout, bilaterally with decreased sensation in stocking distribution bilaterally Deep Tendon Reflexes: 1+ and symmetric throughout UE and 1+ bilateral KJ no AJ Plantars: Right: downgoing   Left: downgoing Cerebellar: normal finger-to-nose, and normal heel-to-shin test Gait: not tested for patient safety      Lab Results: Basic Metabolic Panel:  Recent Labs Lab 03/05/15 0939  NA 139  K 3.5  CL 106  CO2 25  GLUCOSE 185*  BUN 10  CREATININE 1.11  CALCIUM 8.9    Liver Function Tests:  Recent  Labs Lab 03/05/15 [phone]  AST 29  ALT 25  ALKPHOS 64  BILITOT 1.4*  PROT 6.9  ALBUMIN 3.4*   No results for input(s): LIPASE, AMYLASE in the last 168 hours. No results for input(s): AMMONIA in the last 168 hours.  CBC:  Recent Labs Lab 03/05/15 0939  WBC 2.8*  NEUTROABS 1.2*  HGB 13.9  HCT 42.1  MCV 82.2  PLT 165    Cardiac Enzymes: No results for input(s): CKTOTAL, CKMB, CKMBINDEX, TROPONINI in the last 168 hours.  Lipid Panel: No results for input(s): CHOL, TRIG, HDL, CHOLHDL, VLDL, LDLCALC in the last 168 hours.  CBG: No results for input(s): GLUCAP in the last 168 hours.  Microbiology: No results found for this or any previous visit.  Coagulation Studies:  Recent Labs  03/05/15 0939  LABPROT 22.5*  INR 2.00*    Imaging: Ct Head Wo Contrast  03/05/2015   CLINICAL DATA:  79 year old hypertensive male with history of prior stroke and left-sided weakness presenting with numbness left arm and leg which is different than prior symptoms with onset 6-7 o'clock this morning. Subsequent encounter.  EXAM: CT HEAD WITHOUT CONTRAST  TECHNIQUE: Contiguous axial images were obtained from the base of the skull through the vertex without intravenous contrast.  COMPARISON:  03/19/2014 MR. 03/17/2014 CT.  FINDINGS: Large remote right middle cerebral artery distribution infarct with large area of encephalomalacia right temporal lobe, right frontal lobe and right parietal lobe with subsequent dilation of the right lateral ventricle.  No CT evidence of large acute infarct.  Global atrophy without hydrocephalus.  No intracranial hemorrhage.  No intracranial mass lesion noted on this unenhanced exam.  Vascular calcifications.  Minimal mucosal thickening ethmoid sinus air cells, frontal sinus and right sphenoid sinus.  IMPRESSION: Large remote right middle cerebral artery distribution infarct.  No CT evidence of large acute infarct.  Global atrophy without hydrocephalus.  No intracranial  hemorrhage.   Electronically Signed   By: Genia Del M.D.   On: 03/05/2015 10:23    Hammond Obeirne PA-C [facility] [phone]  03/05/2015, 12:30 PM   Assessment/Plan: 79 YO male with inability to evert and dorsiflex his left foot.  Likely represents a left peroneal neuropathy.   Recommend: 1) AFO for his left foot drop 2) out patient EMG/NCV test in 4 weeks if no improvement with out patient neurology.  3) PT for gait and rehab as out patient  I personally participated in this patient's evaluation and management, including clinical examination, as well as formulating above clinical impression and management recommendations.  Rush Farmer M.D. [facility] [phone]

## 2015-03-05 NOTE — ED Notes (Addendum)
Neuro PA at bedside.  

## 2015-03-05 NOTE — ED Notes (Signed)
PT states when he woke up the am he had numbness and difficulty using L arm and leg.  LSN 11 pm last night.

## 2015-03-05 NOTE — ED Notes (Signed)
Attempted to call [phone] for outside vendor brance hanger/advanced foot up without success. No voicemail set up.

## 2015-03-05 NOTE — ED Notes (Signed)
Patient and Vendor updated- pt will call vendor to receive brace. Prescription written by Dr. Tomi Bamberger at given to patient.

## 2015-03-05 NOTE — ED Notes (Signed)
Pt states the numbness to his left arm and left leg started sometime this morning between 6 and 7 am. Has hx of prior stroke with deficits to left side and sensation is slightly different. No facial droop. Grips equal on both sides. Dr. Tomi Bamberger made aware of pt.

## 2015-03-05 NOTE — Discharge Instructions (Signed)

## 2015-03-11 ENCOUNTER — Ambulatory Visit (INDEPENDENT_AMBULATORY_CARE_PROVIDER_SITE_OTHER): Payer: Medicare Other | Admitting: Neurology

## 2015-03-11 ENCOUNTER — Encounter: Payer: Self-pay | Admitting: Neurology

## 2015-03-11 VITALS — BP 123/74 | HR 95 | Ht 69.0 in | Wt 211.8 lb

## 2015-03-11 DIAGNOSIS — M21372 Foot drop, left foot: Secondary | ICD-10-CM

## 2015-03-11 DIAGNOSIS — R269 Unspecified abnormalities of gait and mobility: Secondary | ICD-10-CM

## 2015-03-11 HISTORY — DX: Unspecified abnormalities of gait and mobility: R26.9

## 2015-03-11 HISTORY — DX: Foot drop, left foot: M21.372

## 2015-03-11 NOTE — Progress Notes (Signed)
Reason for visit: Left foot drop  Referring physician: [place]  KEEYON [name] is a 79 y.o. male  History of present illness:  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 The patient was seen and followed by Dr. Erling Cruz in the past. The patient has had surprisingly little deficit from the prior stroke, he was unable to operate a motor vehicle secondary to some mild peripheral visual changes, but otherwise he has been ambulatory without assistive devices. He awakened on the morning of the 03/05/2015 to find that the left leg was not working properly. The patient went to the emergency room, and a CT scan of the brain showed no acute changes. A neurologic evaluation suggested that the left leg weakness was related to a peroneal neuropathy, and the patient was referred to this office. The patient indicates that he has no pain associated with the left foot drop, he denies any significant numbness. He denies any back pain or pain down the leg. He does not report any other weakness with the arms or face. He denies any headache, new visual changes, or speech changes. He denies issues controlling the bowels or the bladder. He comes to this office for an evaluation.  Past Medical History  Diagnosis Date  . Hypertension   . Stroke   . Syncope and collapse   . Hyperlipidemia   . Coronary artery disease   . Atrial fibrillation   . V-tach   . Bradycardia   . Alcoholic cardiomyopathy   . Pulmonary eosinophilia   . Unspecified glaucoma   . GERD (gastroesophageal reflux disease)   . Long term (current) use of anticoagulants   . Gout   . Foot drop, left 03/11/2015  . Gait disorder 03/11/2015    Past Surgical History  Procedure Laterality Date  . Loop recorder  09/2007  . Knee surgery      Family History  Problem Relation Age of Onset  . Stroke Neg Hx   . Colon cancer Mother   .  Hypertension Mother   . Cancer Mother   . Prostate cancer Brother   . Cancer Brother   . Heart disease Father   . Heart attack Father   . Hypertension Father   . Heart attack Sister   . Cancer Sister     Social history:  reports that he quit smoking about 31 years ago. He does not have any smokeless tobacco history on file. He reports that he does not drink alcohol or use illicit drugs.  Medications:  Prior to Admission medications   Medication Sig Start Date End Date Taking? Authorizing Provider  aspirin 81 MG chewable tablet Chew 81 mg by mouth daily.   Yes Historical Provider, MD  atorvastatin (LIPITOR) 40 MG tablet TAKE 1 TABLET BY MOUTH EVERY DAY 08/15/14  Yes [name], MD  brimonidine (ALPHAGAN P) 0.1 % SOLN Place 1 drop into both eyes 2 (two) times daily.    Yes Historical Provider, MD  carvedilol (COREG) 6.25 MG tablet TAKE 1 TABLET BY MOUTH TWICE DAILY 12/17/14  Yes [name], MD  colchicine 0.6 MG tablet Take 0.6 mg by mouth daily.    Yes Historical Provider, MD  famotidine (PEPCID) 20 MG tablet Take 20 mg by mouth 2 (two) times daily.   Yes Historical Provider, MD  ferrous sulfate 325 (65 FE) MG tablet Take 325 mg by  mouth daily with breakfast.   Yes Historical Provider, MD  fluocinonide cream (LIDEX) 0.05 % Apply topically. 01/13/15  Yes Historical Provider, MD  gabapentin (NEURONTIN) 300 MG capsule Take 300 mg by mouth 2 (two) times daily.   Yes Historical Provider, MD  hydrocortisone (ANUSOL-HC) 2.5 % rectal cream Place 1 application rectally 2 (two) times daily as needed for hemorrhoids or itching.   Yes Historical Provider, MD  hydrocortisone (PROCTOZONE-HC) 2.5 % rectal cream Apply topically. 03/14/14  Yes Historical Provider, MD  hydrocortisone cream 1 % Apply 1 application topically 2 (two) times daily as needed (for eczmea).   Yes Historical Provider, MD  irbesartan (AVAPRO) 300 MG tablet Take 300 mg by mouth at bedtime.    Yes Historical Provider, MD    latanoprost (XALATAN) 0.005 % ophthalmic solution Place 1 drop into both eyes at bedtime.   Yes Historical Provider, MD  loratadine (CLARITIN) 10 MG tablet Take 10 mg by mouth daily.     Yes Historical Provider, MD  meclizine (ANTIVERT) 25 MG tablet Take 25 mg by mouth 3 (three) times daily as needed for dizziness.   Yes Historical Provider, MD  nitroGLYCERIN (NITROLINGUAL) 0.4 MG/SPRAY spray Place 1 spray under the tongue every 5 (five) minutes x 3 doses as needed for chest pain.   Yes Historical Provider, MD  Omega-3 Fatty Acids (FISH OIL) 1000 MG CAPS Take 1,000 mg by mouth daily.   Yes Historical Provider, MD  spironolactone (ALDACTONE) 25 MG tablet Take 25 mg by mouth daily.   Yes Historical Provider, MD  Tamsulosin HCl (FLOMAX) 0.4 MG CAPS Take 0.4 mg by mouth daily. 12/28/11  Yes Historical Provider, MD  triamcinolone cream (KENALOG) 0.1 % Apply topically. 01/20/15  Yes Historical Provider, MD  warfarin (COUMADIN) 2 MG tablet TAKE AS DIRECTED BY COUMADIN CLINIC 02/16/15  Yes [name], MD     No Known Allergies  ROS:  Out of a complete 14 system review of symptoms, the patient complains only of the following symptoms, and all other reviewed systems are negative.  Left leg weakness  Blood pressure 123/74, pulse 95, height 5\' 9"  (1.753 m), weight 211 lb 12.8 oz (96.072 kg).  Physical Exam  General: The patient is alert and cooperative at the time of the examination. The patient is moderately obese.  Eyes: Pupils are equal, round, and reactive to light. Discs are flat bilaterally.  Neck: The neck is supple, no carotid bruits are noted.  Respiratory: The respiratory examination is clear.  Cardiovascular: The cardiovascular examination reveals a regular rate and rhythm, no obvious murmurs or rubs are noted.  Skin: Extremities are with 2+ edema below the knees bilaterally.  Neurologic Exam  Mental status: The patient is alert and oriented x 3 at the time of the  examination. The patient has apparent normal recent and remote memory, with an apparently normal attention span and concentration ability.  Cranial nerves: Facial symmetry is present. There is good sensation of the face to pinprick and soft touch bilaterally. The strength of the facial muscles and the muscles to head turning and shoulder shrug are normal bilaterally. Speech is well enunciated, no aphasia or dysarthria is noted. Extraocular movements are full. Visual fields are full, with exception of minimal left homonymous visual field constriction. The tongue is midline, and the patient has symmetric elevation of the soft palate. No obvious hearing deficits are noted.  Motor: The motor testing reveals 5 over 5 strength of all 4 extremities, with exception of a  prominent left footdrop with dorsiflexion and eversion of the foot. Inversion of the foot is of normal strength. Good symmetric motor tone is noted throughout.  Sensory: Sensory testing is intact to pinprick, soft touch, vibration sensation, and position sense on all 4 extremities, with exception of some decrease in pinprick sensation on the dorsum of the left foot. Extinction is noted on the left side.  Coordination: Cerebellar testing reveals good finger-nose-finger and heel-to-shin bilaterally.  Gait and station: Gait is slightly wide-based, the patient has a steppage gait on the left. Tandem gait is slightly unsteady. Romberg is negative.  Reflexes: Deep tendon reflexes are symmetric, but are depressed bilaterally. Toes are downgoing bilaterally.   CT head 03/05/15:  IMPRESSION: Large remote right middle cerebral artery distribution infarct.  No CT evidence of large acute infarct.  Global atrophy without hydrocephalus.  No intracranial hemorrhage.  * CT scan images were reviewed online. I agree with the written report.    Assessment/Plan:  1. History of right middle cerebral stroke  2. Left foot drop, new onset  The  distribution of weakness of the left leg is consistent with a severe left peroneal neuropathy. Cerebrovascular disease does still need to be considered, and for this reason EMG and nerve conduction study evaluation is important. If the studies are normal, the patient should be set up for MRI evaluation of the brain. The patient will have nerve conductions on both legs, EMG on the left leg. The patient may require an AFO brace in the future. The patient will follow-up for the EMG study. Blood work in the emergency room at 9:30 AM showed alcohol in his system, and a random blood sugar was in the 185 range. Further blood work in the future may be required. The patient has no prior history of diabetes.  Jill Alexanders MD 03/11/2015 8:56 AM  [facility] [address] [place], [place] [address]  Phone [phone] Fax [phone]

## 2015-03-11 NOTE — Patient Instructions (Signed)

## 2015-03-12 ENCOUNTER — Ambulatory Visit (INDEPENDENT_AMBULATORY_CARE_PROVIDER_SITE_OTHER): Payer: Medicare Other | Admitting: *Deleted

## 2015-03-12 DIAGNOSIS — Z7901 Long term (current) use of anticoagulants: Secondary | ICD-10-CM | POA: Diagnosis not present

## 2015-03-12 DIAGNOSIS — Z5181 Encounter for therapeutic drug level monitoring: Secondary | ICD-10-CM | POA: Diagnosis not present

## 2015-03-12 DIAGNOSIS — I4891 Unspecified atrial fibrillation: Secondary | ICD-10-CM | POA: Diagnosis not present

## 2015-03-12 DIAGNOSIS — I639 Cerebral infarction, unspecified: Secondary | ICD-10-CM | POA: Diagnosis not present

## 2015-03-12 DIAGNOSIS — I6350 Cerebral infarction due to unspecified occlusion or stenosis of unspecified cerebral artery: Secondary | ICD-10-CM

## 2015-03-12 LAB — POCT INR: INR: 3.6

## 2015-04-02 ENCOUNTER — Ambulatory Visit (INDEPENDENT_AMBULATORY_CARE_PROVIDER_SITE_OTHER): Payer: Medicare Other | Admitting: Neurology

## 2015-04-02 ENCOUNTER — Encounter: Payer: Self-pay | Admitting: Neurology

## 2015-04-02 ENCOUNTER — Ambulatory Visit (INDEPENDENT_AMBULATORY_CARE_PROVIDER_SITE_OTHER): Payer: Self-pay | Admitting: Neurology

## 2015-04-02 DIAGNOSIS — G5732 Lesion of lateral popliteal nerve, left lower limb: Secondary | ICD-10-CM | POA: Diagnosis not present

## 2015-04-02 DIAGNOSIS — M21372 Foot drop, left foot: Secondary | ICD-10-CM | POA: Diagnosis not present

## 2015-04-02 DIAGNOSIS — R269 Unspecified abnormalities of gait and mobility: Secondary | ICD-10-CM

## 2015-04-02 NOTE — Progress Notes (Signed)
Please refer to EMG and nerve conduction study procedure note. 

## 2015-04-02 NOTE — Procedures (Signed)
     HISTORY:   Frederich Montilla is a 79 year old gentleman with onset of a severe left sided footdrop. He denies any back pain or pain down the leg. He has a history of cerebrovascular disease, he is being evaluated for a possible peroneal neuropathy.  NERVE CONDUCTION STUDIES:  Nerve conduction studies were performed on both lower extremities. The distal motor latencies for the peroneal and posterior tibial nerves were within normal limits bilaterally with normal motor amplitudes for these nerves bilaterally. There is a drop-off in motor amplitudes, however on the left peroneal nerve above and below the knee. The H reflex latencies were symmetric and normal bilaterally. The nerve conduction velocities for the peroneal nerves were slowed below the knee on the left, normal on the right, and normal for the posterior tibial nerves laterally. The peroneal sensory latencies were normal on the right, prolonged on the left.  EMG STUDIES:  EMG study was performed on the left lower extremity:  The tibialis anterior muscle reveals 2 to 3K motor units with markedly reduced recruitment. 2+ fibrillations and positive waves were seen. The peroneus tertius muscle reveals 1 to 2K motor units with markedly reduced recruitment. 2+ fibrillations and positive waves were seen. The medial gastrocnemius muscle reveals 1 to 3K motor units with full recruitment. No fibrillations or positive waves were seen. The vastus lateralis muscle reveals 2 to 4K motor units with full recruitment. No fibrillations or positive waves were seen. The iliopsoas muscle reveals 2 to 4K motor units with full recruitment. No fibrillations or positive waves were seen. The biceps femoris muscle (long head) reveals 2 to 4K motor units with full recruitment. No fibrillations or positive waves were seen. The lumbosacral paraspinal muscles were tested at 3 levels, and revealed 2+ positive waves at all 3 levels tested. There was good  relaxation.   IMPRESSION:  Nerve conduction studies done on both lower extremities reveals evidence of a left peroneal neuropathy. No evidence of a generalized peripheral neuropathy is seen. EMG evaluation of the left lower extremity shows findings consistent with a peroneal neuropathy, but there appears to be some denervation in the lumbar paraspinal muscles as well, the possibility of an overlying L5 radiculopathy needs to be considered, possibly associated with a "double crush syndrome". Clinical correlation is required.  Jill Alexanders MD 04/02/2015 1:53 PM  [facility] [address] [place], [place] [address]  Phone [phone] Fax [phone]

## 2015-04-03 ENCOUNTER — Ambulatory Visit (INDEPENDENT_AMBULATORY_CARE_PROVIDER_SITE_OTHER): Payer: Medicare Other | Admitting: *Deleted

## 2015-04-03 DIAGNOSIS — Z7901 Long term (current) use of anticoagulants: Secondary | ICD-10-CM | POA: Diagnosis not present

## 2015-04-03 DIAGNOSIS — Z5181 Encounter for therapeutic drug level monitoring: Secondary | ICD-10-CM

## 2015-04-03 DIAGNOSIS — I4891 Unspecified atrial fibrillation: Secondary | ICD-10-CM

## 2015-04-03 DIAGNOSIS — I639 Cerebral infarction, unspecified: Secondary | ICD-10-CM | POA: Diagnosis not present

## 2015-04-03 DIAGNOSIS — I6350 Cerebral infarction due to unspecified occlusion or stenosis of unspecified cerebral artery: Secondary | ICD-10-CM

## 2015-04-03 LAB — POCT INR: INR: 1.7

## 2015-04-16 ENCOUNTER — Ambulatory Visit (INDEPENDENT_AMBULATORY_CARE_PROVIDER_SITE_OTHER): Payer: Medicare Other | Admitting: *Deleted

## 2015-04-16 DIAGNOSIS — I4891 Unspecified atrial fibrillation: Secondary | ICD-10-CM

## 2015-04-16 DIAGNOSIS — I639 Cerebral infarction, unspecified: Secondary | ICD-10-CM

## 2015-04-16 DIAGNOSIS — I6350 Cerebral infarction due to unspecified occlusion or stenosis of unspecified cerebral artery: Secondary | ICD-10-CM

## 2015-04-16 DIAGNOSIS — Z5181 Encounter for therapeutic drug level monitoring: Secondary | ICD-10-CM | POA: Diagnosis not present

## 2015-04-16 DIAGNOSIS — Z7901 Long term (current) use of anticoagulants: Secondary | ICD-10-CM

## 2015-04-16 LAB — POCT INR: INR: 2.8

## 2015-05-06 ENCOUNTER — Ambulatory Visit (INDEPENDENT_AMBULATORY_CARE_PROVIDER_SITE_OTHER): Payer: Medicare Other | Admitting: *Deleted

## 2015-05-06 DIAGNOSIS — Z7901 Long term (current) use of anticoagulants: Secondary | ICD-10-CM | POA: Diagnosis not present

## 2015-05-06 DIAGNOSIS — I639 Cerebral infarction, unspecified: Secondary | ICD-10-CM | POA: Diagnosis not present

## 2015-05-06 DIAGNOSIS — Z5181 Encounter for therapeutic drug level monitoring: Secondary | ICD-10-CM | POA: Diagnosis not present

## 2015-05-06 DIAGNOSIS — I4891 Unspecified atrial fibrillation: Secondary | ICD-10-CM

## 2015-05-06 DIAGNOSIS — I6350 Cerebral infarction due to unspecified occlusion or stenosis of unspecified cerebral artery: Secondary | ICD-10-CM

## 2015-05-06 LAB — POCT INR: INR: 2.3

## 2015-05-24 ENCOUNTER — Other Ambulatory Visit: Payer: Self-pay | Admitting: Cardiology

## 2015-05-25 ENCOUNTER — Other Ambulatory Visit: Payer: Self-pay | Admitting: Cardiology

## 2015-05-25 NOTE — Telephone Encounter (Signed)
[name], MD at 07/16/2014 10:58 AM  atorvastatin (LIPITOR) 40 MG tabletTake 40 mg by mouth daily at 6 PM Patient Instructions     Your physician recommends that you continue on your current medications as directed. Please refer to the Current Medication list given to you today.

## 2015-06-03 ENCOUNTER — Ambulatory Visit (INDEPENDENT_AMBULATORY_CARE_PROVIDER_SITE_OTHER): Payer: Medicare Other | Admitting: *Deleted

## 2015-06-03 DIAGNOSIS — Z7901 Long term (current) use of anticoagulants: Secondary | ICD-10-CM

## 2015-06-03 DIAGNOSIS — I6350 Cerebral infarction due to unspecified occlusion or stenosis of unspecified cerebral artery: Secondary | ICD-10-CM | POA: Diagnosis not present

## 2015-06-03 DIAGNOSIS — Z5181 Encounter for therapeutic drug level monitoring: Secondary | ICD-10-CM

## 2015-06-03 DIAGNOSIS — I4891 Unspecified atrial fibrillation: Secondary | ICD-10-CM | POA: Diagnosis not present

## 2015-06-03 LAB — POCT INR: INR: 2.1

## 2015-06-07 ENCOUNTER — Other Ambulatory Visit: Payer: Self-pay | Admitting: Cardiology

## 2015-06-08 NOTE — Telephone Encounter (Signed)
Pt requesting a refill

## 2015-07-01 ENCOUNTER — Ambulatory Visit (INDEPENDENT_AMBULATORY_CARE_PROVIDER_SITE_OTHER): Payer: Medicare Other | Admitting: *Deleted

## 2015-07-01 DIAGNOSIS — Z5181 Encounter for therapeutic drug level monitoring: Secondary | ICD-10-CM

## 2015-07-01 DIAGNOSIS — I6350 Cerebral infarction due to unspecified occlusion or stenosis of unspecified cerebral artery: Secondary | ICD-10-CM | POA: Diagnosis not present

## 2015-07-01 DIAGNOSIS — I4891 Unspecified atrial fibrillation: Secondary | ICD-10-CM

## 2015-07-01 DIAGNOSIS — Z7901 Long term (current) use of anticoagulants: Secondary | ICD-10-CM

## 2015-07-01 LAB — POCT INR: INR: 1.4

## 2015-07-07 ENCOUNTER — Ambulatory Visit (INDEPENDENT_AMBULATORY_CARE_PROVIDER_SITE_OTHER): Payer: Medicare Other | Admitting: *Deleted

## 2015-07-07 DIAGNOSIS — I6350 Cerebral infarction due to unspecified occlusion or stenosis of unspecified cerebral artery: Secondary | ICD-10-CM | POA: Diagnosis not present

## 2015-07-07 DIAGNOSIS — Z5181 Encounter for therapeutic drug level monitoring: Secondary | ICD-10-CM | POA: Diagnosis not present

## 2015-07-07 DIAGNOSIS — Z7901 Long term (current) use of anticoagulants: Secondary | ICD-10-CM

## 2015-07-07 DIAGNOSIS — I4891 Unspecified atrial fibrillation: Secondary | ICD-10-CM

## 2015-07-07 LAB — POCT INR: INR: 2.1

## 2015-07-21 ENCOUNTER — Encounter: Payer: Self-pay | Admitting: Cardiology

## 2015-07-21 ENCOUNTER — Encounter: Payer: Self-pay | Admitting: *Deleted

## 2015-07-21 ENCOUNTER — Ambulatory Visit (INDEPENDENT_AMBULATORY_CARE_PROVIDER_SITE_OTHER): Payer: Medicare Other | Admitting: Cardiology

## 2015-07-21 ENCOUNTER — Ambulatory Visit (INDEPENDENT_AMBULATORY_CARE_PROVIDER_SITE_OTHER): Payer: Medicare Other | Admitting: *Deleted

## 2015-07-21 VITALS — BP 128/70 | HR 68 | Ht 69.0 in | Wt 211.0 lb

## 2015-07-21 DIAGNOSIS — I2510 Atherosclerotic heart disease of native coronary artery without angina pectoris: Secondary | ICD-10-CM

## 2015-07-21 DIAGNOSIS — Z01812 Encounter for preprocedural laboratory examination: Secondary | ICD-10-CM

## 2015-07-21 DIAGNOSIS — I2583 Coronary atherosclerosis due to lipid rich plaque: Secondary | ICD-10-CM

## 2015-07-21 DIAGNOSIS — I482 Chronic atrial fibrillation: Secondary | ICD-10-CM

## 2015-07-21 DIAGNOSIS — I209 Angina pectoris, unspecified: Secondary | ICD-10-CM

## 2015-07-21 DIAGNOSIS — Z7901 Long term (current) use of anticoagulants: Secondary | ICD-10-CM | POA: Diagnosis not present

## 2015-07-21 DIAGNOSIS — I10 Essential (primary) hypertension: Secondary | ICD-10-CM | POA: Diagnosis not present

## 2015-07-21 DIAGNOSIS — R9439 Abnormal result of other cardiovascular function study: Secondary | ICD-10-CM

## 2015-07-21 DIAGNOSIS — I5023 Acute on chronic systolic (congestive) heart failure: Secondary | ICD-10-CM | POA: Diagnosis not present

## 2015-07-21 DIAGNOSIS — I6350 Cerebral infarction due to unspecified occlusion or stenosis of unspecified cerebral artery: Secondary | ICD-10-CM | POA: Diagnosis not present

## 2015-07-21 DIAGNOSIS — Z5181 Encounter for therapeutic drug level monitoring: Secondary | ICD-10-CM

## 2015-07-21 DIAGNOSIS — I4891 Unspecified atrial fibrillation: Secondary | ICD-10-CM

## 2015-07-21 DIAGNOSIS — I4821 Permanent atrial fibrillation: Secondary | ICD-10-CM

## 2015-07-21 LAB — COMPREHENSIVE METABOLIC PANEL
ALT: 18 U/L (ref 9–46)
AST: 19 U/L (ref 10–35)
Albumin: 3.8 g/dL (ref 3.6–5.1)
Alkaline Phosphatase: 78 U/L (ref 40–115)
BUN: 11 mg/dL (ref 7–25)
CO2: 28 mmol/L (ref 20–31)
Calcium: 8.4 mg/dL — ABNORMAL LOW (ref 8.6–10.3)
Chloride: 104 mmol/L (ref 98–110)
Creat: 0.97 mg/dL (ref 0.70–1.18)
Glucose, Bld: 111 mg/dL — ABNORMAL HIGH (ref 65–99)
Potassium: 4.1 mmol/L (ref 3.5–5.3)
Sodium: 140 mmol/L (ref 135–146)
Total Bilirubin: 1.3 mg/dL — ABNORMAL HIGH (ref 0.2–1.2)
Total Protein: 6.8 g/dL (ref 6.1–8.1)

## 2015-07-21 LAB — CBC WITH DIFFERENTIAL/PLATELET
Basophils Absolute: 0 10*3/uL (ref 0.0–0.1)
Basophils Relative: 1 % (ref 0–1)
Eosinophils Absolute: 0.2 10*3/uL (ref 0.0–0.7)
Eosinophils Relative: 5 % (ref 0–5)
HCT: 39.4 % (ref 39.0–52.0)
Hemoglobin: 13.5 g/dL (ref 13.0–17.0)
Lymphocytes Relative: 29 % (ref 12–46)
Lymphs Abs: 1.3 10*3/uL (ref 0.7–4.0)
MCH: 27.8 pg (ref 26.0–34.0)
MCHC: 34.3 g/dL (ref 30.0–36.0)
MCV: 81.2 fL (ref 78.0–100.0)
MPV: 10.9 fL (ref 8.6–12.4)
Monocytes Absolute: 0.6 10*3/uL (ref 0.1–1.0)
Monocytes Relative: 13 % — ABNORMAL HIGH (ref 3–12)
Neutro Abs: 2.3 10*3/uL (ref 1.7–7.7)
Neutrophils Relative %: 52 % (ref 43–77)
Platelets: 216 10*3/uL (ref 150–400)
RBC: 4.85 MIL/uL (ref 4.22–5.81)
RDW: 14.7 % (ref 11.5–15.5)
WBC: 4.4 10*3/uL (ref 4.0–10.5)

## 2015-07-21 LAB — POCT INR: INR: 2.1

## 2015-07-21 MED ORDER — FUROSEMIDE 20 MG PO TABS: 20.0000 mg | ORAL_TABLET | Freq: Every day | ORAL | Status: DC

## 2015-07-21 MED ORDER — ENOXAPARIN SODIUM 100 MG/ML ~~LOC~~ SOLN: 100.0000 mg | Freq: Two times a day (BID) | SUBCUTANEOUS | Status: DC

## 2015-07-21 MED ORDER — IRBESARTAN 300 MG PO TABS: 300.0000 mg | ORAL_TABLET | Freq: Every day | ORAL | Status: DC

## 2015-07-21 NOTE — Addendum Note (Signed)
Addended byZenovia Jarred on: 07/21/2015 03:02 PM   Modules accepted: Orders

## 2015-07-21 NOTE — Patient Instructions (Addendum)
07/21/15-Take 2 tablets of Coumadin tonight  07/22/15-Take 1 tablet of Coumadin   07/23/15-Take 2 tablets of Coumadin   07/24/15- Take last dosage of Coumadin, 1 tablet.   07/25/15- Do not take any Coumadin or Lovenox injection  07/26/15- Start Lovenox 100mg s injection at 8am and 8pm, give injection into fatty tissue of the stomach. Give injection 2 inches away from navel, rotate sites.  07/27/15- Continue Lovenox 100mg s injections at 8am and 8pm,, give injection into fatty tissue of the stomach. Give injection 2 inches away from navel, rotate sites.  07/28/15- Continue Lovenox 100mg s injections at 8am and 8pm,, give injection into fatty tissue of the stomach. Give injection 2 inches away from navel, rotate sites.  07/29/15- Take only one Lovenox 100mg s injection this day at 8am.    Do not take an injection at 8pm.   07/30/15- Day of Heart Cath- do not do any injections. After Cath, restart Coumadin when it is okay with Dr [name]. Usually once you get home but check with Dr [name] on when to restart Coumadin and when to restart Lovenox. When you do restart coumadin take an extra 1/2 pill along with your normal days dosage for 2 days only, then resume your normal daily dosage.  Usually, restart Lovenox injections 1 day after your procedure, when you restart Lovenox injections go back to an every 12 hour schedule at 8am and 8pm. Will take both Coumadin and Lovenox together after procedure until your Coumadin clinic appt.

## 2015-07-21 NOTE — Progress Notes (Signed)
Patient ID: Vincent [name], male   DOB: 17-May-1936, 79 y.o.   MRN: [id number] Patient ID: Vincent [name], male   DOB: [birth year], 79 y.o.   MRN: [id number]    Patient Name: Vincent [name] Date of Encounter: 07/21/2015  Primary Care Provider:  [name], MD Primary Cardiologist:  [name]  Problem List   Past Medical History  Diagnosis Date  . Hypertension   . Stroke ([place])   . Syncope and collapse   . Hyperlipidemia   . Coronary artery disease   . Atrial fibrillation ([place])   . V-tach ([place])   . Bradycardia   . Alcoholic cardiomyopathy ([place])   . Pulmonary eosinophilia ([place])   . Unspecified glaucoma   . GERD (gastroesophageal reflux disease)   . Long term (current) use of anticoagulants   . Gout   . Foot drop, left 03/11/2015  . Gait disorder 03/11/2015  . 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 Loop recorder  09/2007  . Knee surgery      Allergies  No Known Allergies  HPI  Vincent [name] returns today for evaluation and management of his complex cardiovascular history, ischemic and nonischemic cardiomyopathy with prior PCI and previously depressed but now normal left ventricular function by echo in 2009.  He is also seen in the devise clinic for implantation of a loop recorder in February 2009 due to spells of dizziness described as presyncopal versus partial seizures. He has had recurrent episodes while his ILR was functioning and there were no arrhythmias documented correlating to his symptoms. His ILR was deemed non-functioning back in April 2013 due to battery depletion as it was implanted in 2009.   The patient was admitted in July 2015 for dizziness/near-syncope, etiology unclear.  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 He was not able to get ENT referral yet.  -  2D ECHO  done 7/21 = EF 40-45% with diffuse hypokinesis. Nuclear stress test showed an old MI but no ischemia.  He reports compliance with his medications. No CP or SOB. His sister (who is also my patient) states that they recently lost their sister, father and a niece and that the patient seems depressed. He denies and doesn't want to be treated for it.  07/21/2015 - the patient is coming after 1 year, he has been compliant with his meds, but has been experiencing worsening chest pain on exertion like walking uphill. His stress test in 02/2014 showed inferior scar with mild inferolateral ischemia, but he was minimally symptomatic at that time. He has also noticed mild LE edema, no orthopnea, PND, no palpitations or syncope.    Home Medications  Prior to Admission medications   Medication Sig Start Date End Date Taking? Authorizing Provider  aspirin 81 MG chewable tablet Chew 81 mg by mouth daily.   Yes Historical Provider, MD  atorvastatin (LIPITOR) 40 MG tablet Take 40 mg by mouth daily at 6 PM.   Yes Historical Provider, MD  bimatoprost (LUMIGAN) 0.03 % ophthalmic solution Place 1 drop into both eyes at bedtime.    Yes Historical Provider, MD  brimonidine (ALPHAGAN P) 0.1 % SOLN Place 1 drop into the right eye 2 (two) times daily.    Yes Historical Provider, MD  colchicine 0.6 MG tablet Take 0.6 mg by mouth daily.    Yes Historical  Provider, MD  famotidine (PEPCID) 20 MG tablet Take 20 mg by mouth 2 (two) times daily.   Yes Historical Provider, MD  ferrous sulfate 325 (65 FE) MG tablet Take 325 mg by mouth daily with breakfast.   Yes Historical Provider, MD  hydrocortisone (ANUSOL-HC) 2.5 % rectal cream Place 1 application rectally 2 (two) times daily as needed for hemorrhoids or itching.   Yes Historical Provider, MD  hydrocortisone cream 1 % Apply 1 application topically 2 (two) times daily as needed (for eczmea).   Yes Historical Provider, MD  irbesartan (AVAPRO) 300 MG tablet Take 300 mg by mouth  daily.   Yes Historical Provider, MD  loratadine (CLARITIN) 10 MG tablet Take 10 mg by mouth daily.     Yes Historical Provider, MD  meclizine (ANTIVERT) 25 MG tablet Take 25 mg by mouth 3 (three) times daily as needed for dizziness.   Yes Historical Provider, MD  nitroGLYCERIN (NITROLINGUAL) 0.4 MG/SPRAY spray Place 1 spray under the tongue every 5 (five) minutes x 3 doses as needed for chest pain.   Yes Historical Provider, MD  Omega-3 Fatty Acids (FISH OIL) 1000 MG CAPS Take 1,000 mg by mouth daily.   Yes Historical Provider, MD  spironolactone (ALDACTONE) 25 MG tablet Take 25 mg by mouth daily.   Yes Historical Provider, MD  Tamsulosin HCl (FLOMAX) 0.4 MG CAPS Take 0.4 mg by mouth daily. 12/28/11  Yes Historical Provider, MD  warfarin (COUMADIN) 2 MG tablet Take 2-4 mg by mouth daily. *takes 4mg  every day except for 2mg  on mondays and fridays*   Yes Historical Provider, MD    Family History  Family History  Problem Relation Age of Onset  . Stroke Neg Hx   . Colon cancer Mother   . Hypertension Mother   . Cancer Mother   . Prostate cancer Brother   . Cancer Brother   . Heart disease Father   . Heart attack Father   . Hypertension Father   . Heart attack Sister   . Cancer Sister     Social History  Social History   Social History  . Marital Status: Divorced    Spouse Name: N/A  . Number of Children: 0  . Years of Education: master's   Occupational History  . semi-retired    Social History Main Topics  . Smoking status: Former Smoker    Quit date: 08/22/1983  . Smokeless tobacco: Not on file  . Alcohol Use: No  . Drug Use: No  . Sexual Activity: Not on file   Other Topics Concern  . Not on file   Social History Narrative   Patient drinks 1-2 cups of caffeine daily.   Patient is right handed.     Review of Systems, as per HPI, otherwise negative General:  No chills, fever, night sweats or weight changes.  Cardiovascular:  No chest pain, dyspnea on exertion,  edema, orthopnea, palpitations, paroxysmal nocturnal dyspnea. Dermatological: No rash, lesions/masses Respiratory: No cough, dyspnea Urologic: No hematuria, dysuria Abdominal:   No nausea, vomiting, diarrhea, bright red blood per rectum, melena, or hematemesis Neurologic:  No visual changes, wkns, changes in mental status. All other systems reviewed and are otherwise negative except as noted above.  Physical Exam  Blood pressure 128/70, pulse 68, height 5\' 9"  (1.753 m), weight 211 lb (95.709 kg).  General: Pleasant, NAD Psych: Normal affect. Neuro: Alert and oriented X 3. Moves all extremities spontaneously. HEENT: Normal  Neck: Supple without bruits or JVD. Lungs:  Resp regular and unlabored, CTA. Heart: RRR no s3, s4, or murmurs. Abdomen: Soft, non-tender, non-distended, BS + x 4.  Extremities: No clubbing, cyanosis or edema. DP/PT/Radials 2+ and equal bilaterally.  Labs:  No results for input(s): CKTOTAL, CKMB, TROPONINI in the last 72 hours. Lab Results  Component Value Date   WBC 2.8* 03/05/2015   HGB 13.9 03/05/2015   HCT 42.1 03/05/2015   MCV 82.2 03/05/2015   PLT 165 03/05/2015    No results found for: DDIMER Invalid input(s): POCBNP    Component Value Date/Time   NA 139 03/05/2015 0939   K 3.5 03/05/2015 0939   CL 106 03/05/2015 0939   CO2 25 03/05/2015 0939   GLUCOSE 185* 03/05/2015 0939   BUN 10 03/05/2015 0939   CREATININE 1.11 03/05/2015 0939   CALCIUM 8.9 03/05/2015 0939   PROT 6.9 03/05/2015 0939   ALBUMIN 3.4* 03/05/2015 0939   AST 29 03/05/2015 0939   ALT 25 03/05/2015 0939   ALKPHOS 64 03/05/2015 0939   BILITOT 1.4* 03/05/2015 0939   GFRNONAA >60 03/05/2015 0939   GFRAA >60 03/05/2015 0939   Lab Results  Component Value Date   CHOL 152 04/12/2011   HDL 55.40 04/12/2011   LDLCALC 75 04/12/2011   TRIG 106.0 04/12/2011    Accessory Clinical Findings  Echocardiogram - 03/18/14 Left ventricle: The cavity size was normal. Wall thickness  was increased in a pattern of mild LVH. Systolic function was mildly to moderately reduced. The estimated ejection fraction was in the range of 40% to 45%. Diffuse hypokinesis. The transmitral flow pattern was normal. The deceleration time of the early transmitral flow velocity was normal. The pulmonary vein flow pattern was normal. The tissue Doppler parameters were normal. Left ventricular diastolic function parameters were normal.  Lexiscan nuclear stress test 03/20/14 IMPRESSION:  1. Inferior wall reduced activity on stress and rest images,  favoring scar. No inducible ischemia identified. Apical thinning.  2. Mild to moderate hypokinesis and poor wall thickening in the  septum and inferior wall. Left ventricular ejection fraction 46%.   Cath in 2006 03/14/2005: Cardiac Cath Findings: CONCLUSION: 1. Nonobstructive coronary artery disease with 40% narrowing of the  proximal left anterior descending artery, 40% narrowing in the distal  circumflex artery, 0% stenosis at the stent site in the mid-right  coronary artery, and 40% narrowing in the proximal to mid-right coronary  artery. 2. Normal left ventricular function.  RECOMMENDATIONS: The patient has only nonobstructive coronary disease, and his left ventricular function has normalized since 2004. In retrospect, his previous cardiomyopathy may have been a rate-related cardiomyopathy related to his atrial fibrillation, although I do not have documentation of the past rate. I will plan to continue medical therapy and continue workup of his fever.   Assessment/Plan:  1. CAD, cath in 2006 showed non-obstructive CAD with 40% stenosis in all 3 coronary arteries, now abnormal stress test and worsening exertional chest pain. We will schedule him for a left cardiac cath on 07/29/2015. Labs today. Coumadin clinic to bridge.  2. Acute on chronic systolic CHF - start lasix 20 mg po daily.   3.  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 P.Afib -rate controlled, continue Coumadin, Pharmacy dosing  5. H/o alcoholic cardiomyopathy -stable  6. HTN -continue Avapro/aldactone, add carvedilol 6.25 mg po bid  Follow up in 2 months.   [name], MD, [facility] 07/21/2015, 1:47 PM

## 2015-07-21 NOTE — Patient Instructions (Signed)
Medication Instructions:   START TAKING LASIX 20 MG ONCE DAILY   Labwork:  TODAY FOR PRE-CATH LABS--PT/INR (BRIDGING WITH SALLY EARL IN COUMADIN), CMET AND CBC W DIFF   Testing/Procedures:  Your physician has requested that you have a cardiac catheterization. Cardiac catheterization is used to diagnose and/or treat various heart conditions. Doctors may recommend this procedure for a number of different reasons. The most common reason is to evaluate chest pain. Chest pain can be a symptom of coronary artery disease (CAD), and cardiac catheterization can show whether plaque is narrowing or blocking your heart's arteries. This procedure is also used to evaluate the valves, as well as measure the blood flow and oxygen levels in different parts of your heart. For further information please visit [email or web address]. Please follow instruction sheet, as given.  YOUR CATH IS SCHEDULED FOR THURSDAY 07/30/15 AT 5:30 AM [facility] (SHORT STAY) AT [place].    Follow-Up:  2 MONTHS WITH DR Meda Coffee    If you need a refill on your cardiac medications before your next appointment, please call your pharmacy.

## 2015-07-29 ENCOUNTER — Other Ambulatory Visit: Payer: Self-pay | Admitting: Interventional Cardiology

## 2015-07-29 DIAGNOSIS — I208 Other forms of angina pectoris: Secondary | ICD-10-CM

## 2015-07-29 NOTE — Interval H&P Note (Signed)
History and Physical Interval Note:  07/29/2015 6:21 PM  Vincent [name]  has presented today for surgery, with the diagnosis of Abnormal Stress Test  The various methods of treatment have been discussed with the patient and family. After consideration of risks, benefits and other options for treatment, the patient has consented to  Procedure(s): Left Heart Cath and Coronary Angiography (N/A) as a surgical intervention .  The patient's history has been reviewed, patient examined, no change in status, stable for surgery.  I have reviewed the patient's chart and labs.  Questions were answered to the patient's satisfaction.     Sinclair Grooms

## 2015-07-29 NOTE — H&P (View-Only) (Signed)
Patient ID: DAMEER LAMON, male   DOB: 1936/02/25, 79 y.o.   MRN: [id number] Patient ID: BURNEST BRESS, male   DOB: 1935/10/27, 79 y.o.   MRN: [id number]    Patient Name: Vincent [name] Date of Encounter: 07/21/2015  Primary Care Provider:  [name], MD Primary Cardiologist:  [name]  Problem List   Past Medical History  Diagnosis Date  . Hypertension   . Stroke ([place])   . Syncope and collapse   . Hyperlipidemia   . Coronary artery disease   . Atrial fibrillation ([place])   . V-tach ([place])   . Bradycardia   . Alcoholic cardiomyopathy ([place])   . Pulmonary eosinophilia ([place])   . Unspecified glaucoma   . GERD (gastroesophageal reflux disease)   . Long term (current) use of anticoagulants   . Gout   . Foot drop, left 03/11/2015  . Gait disorder 03/11/2015  . 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 Loop recorder  09/2007  . Knee surgery      Allergies  No Known Allergies  HPI  Mr Engebretsen returns today for evaluation and management of his complex cardiovascular history, ischemic and nonischemic cardiomyopathy with prior PCI and previously depressed but now normal left ventricular function by echo in 2009.  He is also seen in the devise clinic for implantation of a loop recorder in February 2009 due to spells of dizziness described as presyncopal versus partial seizures. He has had recurrent episodes while his ILR was functioning and there were no arrhythmias documented correlating to his symptoms. His ILR was deemed non-functioning back in April 2013 due to battery depletion as it was implanted in 2009.   The patient was admitted in July 2015 for dizziness/near-syncope, etiology unclear.  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 He was not able to get ENT referral yet.  -  2D ECHO  done 7/21 = EF 40-45% with diffuse hypokinesis. Nuclear stress test showed an old MI but no ischemia.  He reports compliance with his medications. No CP or SOB. His sister (who is also my patient) states that they recently lost their sister, father and a niece and that the patient seems depressed. He denies and doesn't want to be treated for it.  07/21/2015 - the patient is coming after 1 year, he has been compliant with his meds, but has been experiencing worsening chest pain on exertion like walking uphill. His stress test in 02/2014 showed inferior scar with mild inferolateral ischemia, but he was minimally symptomatic at that time. He has also noticed mild LE edema, no orthopnea, PND, no palpitations or syncope.    Home Medications  Prior to Admission medications   Medication Sig Start Date End Date Taking? Authorizing Provider  aspirin 81 MG chewable tablet Chew 81 mg by mouth daily.   Yes Historical Provider, MD  atorvastatin (LIPITOR) 40 MG tablet Take 40 mg by mouth daily at 6 PM.   Yes Historical Provider, MD  bimatoprost (LUMIGAN) 0.03 % ophthalmic solution Place 1 drop into both eyes at bedtime.    Yes Historical Provider, MD  brimonidine (ALPHAGAN P) 0.1 % SOLN Place 1 drop into the right eye 2 (two) times daily.    Yes Historical Provider, MD  colchicine 0.6 MG tablet Take 0.6 mg by mouth daily.    Yes Historical  Provider, MD  famotidine (PEPCID) 20 MG tablet Take 20 mg by mouth 2 (two) times daily.   Yes Historical Provider, MD  ferrous sulfate 325 (65 FE) MG tablet Take 325 mg by mouth daily with breakfast.   Yes Historical Provider, MD  hydrocortisone (ANUSOL-HC) 2.5 % rectal cream Place 1 application rectally 2 (two) times daily as needed for hemorrhoids or itching.   Yes Historical Provider, MD  hydrocortisone cream 1 % Apply 1 application topically 2 (two) times daily as needed (for eczmea).   Yes Historical Provider, MD  irbesartan (AVAPRO) 300 MG tablet Take 300 mg by mouth  daily.   Yes Historical Provider, MD  loratadine (CLARITIN) 10 MG tablet Take 10 mg by mouth daily.     Yes Historical Provider, MD  meclizine (ANTIVERT) 25 MG tablet Take 25 mg by mouth 3 (three) times daily as needed for dizziness.   Yes Historical Provider, MD  nitroGLYCERIN (NITROLINGUAL) 0.4 MG/SPRAY spray Place 1 spray under the tongue every 5 (five) minutes x 3 doses as needed for chest pain.   Yes Historical Provider, MD  Omega-3 Fatty Acids (FISH OIL) 1000 MG CAPS Take 1,000 mg by mouth daily.   Yes Historical Provider, MD  spironolactone (ALDACTONE) 25 MG tablet Take 25 mg by mouth daily.   Yes Historical Provider, MD  Tamsulosin HCl (FLOMAX) 0.4 MG CAPS Take 0.4 mg by mouth daily. 12/28/11  Yes Historical Provider, MD  warfarin (COUMADIN) 2 MG tablet Take 2-4 mg by mouth daily. *takes 4mg  every day except for 2mg  on mondays and fridays*   Yes Historical Provider, MD    Family History  Family History  Problem Relation Age of Onset  . Stroke Neg Hx   . Colon cancer Mother   . Hypertension Mother   . Cancer Mother   . Prostate cancer Brother   . Cancer Brother   . Heart disease Father   . Heart attack Father   . Hypertension Father   . Heart attack Sister   . Cancer Sister     Social History  Social History   Social History  . Marital Status: Divorced    Spouse Name: N/A  . Number of Children: 0  . Years of Education: master's   Occupational History  . semi-retired    Social History Main Topics  . Smoking status: Former Smoker    Quit date: 08/22/1983  . Smokeless tobacco: Not on file  . Alcohol Use: No  . Drug Use: No  . Sexual Activity: Not on file   Other Topics Concern  . Not on file   Social History Narrative   Patient drinks 1-2 cups of caffeine daily.   Patient is right handed.     Review of Systems, as per HPI, otherwise negative General:  No chills, fever, night sweats or weight changes.  Cardiovascular:  No chest pain, dyspnea on exertion,  edema, orthopnea, palpitations, paroxysmal nocturnal dyspnea. Dermatological: No rash, lesions/masses Respiratory: No cough, dyspnea Urologic: No hematuria, dysuria Abdominal:   No nausea, vomiting, diarrhea, bright red blood per rectum, melena, or hematemesis Neurologic:  No visual changes, wkns, changes in mental status. All other systems reviewed and are otherwise negative except as noted above.  Physical Exam  Blood pressure 128/70, pulse 68, height 5\' 9"  (1.753 m), weight 211 lb (95.709 kg).  General: Pleasant, NAD Psych: Normal affect. Neuro: Alert and oriented X 3. Moves all extremities spontaneously. HEENT: Normal  Neck: Supple without bruits or JVD. Lungs:  Resp regular and unlabored, CTA. Heart: RRR no s3, s4, or murmurs. Abdomen: Soft, non-tender, non-distended, BS + x 4.  Extremities: No clubbing, cyanosis or edema. DP/PT/Radials 2+ and equal bilaterally.  Labs:  No results for input(s): CKTOTAL, CKMB, TROPONINI in the last 72 hours. Lab Results  Component Value Date   WBC 2.8* 03/05/2015   HGB 13.9 03/05/2015   HCT 42.1 03/05/2015   MCV 82.2 03/05/2015   PLT 165 03/05/2015    No results found for: DDIMER Invalid input(s): POCBNP    Component Value Date/Time   NA 139 03/05/2015 0939   K 3.5 03/05/2015 0939   CL 106 03/05/2015 0939   CO2 25 03/05/2015 0939   GLUCOSE 185* 03/05/2015 0939   BUN 10 03/05/2015 0939   CREATININE 1.11 03/05/2015 0939   CALCIUM 8.9 03/05/2015 0939   PROT 6.9 03/05/2015 0939   ALBUMIN 3.4* 03/05/2015 0939   AST 29 03/05/2015 0939   ALT 25 03/05/2015 0939   ALKPHOS 64 03/05/2015 0939   BILITOT 1.4* 03/05/2015 0939   GFRNONAA >60 03/05/2015 0939   GFRAA >60 03/05/2015 0939   Lab Results  Component Value Date   CHOL 152 04/12/2011   HDL 55.40 04/12/2011   LDLCALC 75 04/12/2011   TRIG 106.0 04/12/2011    Accessory Clinical Findings  Echocardiogram - 03/18/14 Left ventricle: The cavity size was normal. Wall thickness  was increased in a pattern of mild LVH. Systolic function was mildly to moderately reduced. The estimated ejection fraction was in the range of 40% to 45%. Diffuse hypokinesis. The transmitral flow pattern was normal. The deceleration time of the early transmitral flow velocity was normal. The pulmonary vein flow pattern was normal. The tissue Doppler parameters were normal. Left ventricular diastolic function parameters were normal.  Lexiscan nuclear stress test 03/20/14 IMPRESSION:  1. Inferior wall reduced activity on stress and rest images,  favoring scar. No inducible ischemia identified. Apical thinning.  2. Mild to moderate hypokinesis and poor wall thickening in the  septum and inferior wall. Left ventricular ejection fraction 46%.   Cath in 2006 03/14/2005: Cardiac Cath Findings: CONCLUSION: 1. Nonobstructive coronary artery disease with 40% narrowing of the  proximal left anterior descending artery, 40% narrowing in the distal  circumflex artery, 0% stenosis at the stent site in the mid-right  coronary artery, and 40% narrowing in the proximal to mid-right coronary  artery. 2. Normal left ventricular function.  RECOMMENDATIONS: The patient has only nonobstructive coronary disease, and his left ventricular function has normalized since 2004. In retrospect, his previous cardiomyopathy may have been a rate-related cardiomyopathy related to his atrial fibrillation, although I do not have documentation of the past rate. I will plan to continue medical therapy and continue workup of his fever.   Assessment/Plan:  1. CAD, cath in 2006 showed non-obstructive CAD with 40% stenosis in all 3 coronary arteries, now abnormal stress test and worsening exertional chest pain. We will schedule him for a left cardiac cath on 07/29/2015. Labs today. Coumadin clinic to bridge.  2. Acute on chronic systolic CHF - start lasix 20 mg po daily.   3.  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 P.Afib -rate controlled, continue Coumadin, Pharmacy dosing  5. H/o alcoholic cardiomyopathy -stable  6. HTN -continue Avapro/aldactone, add carvedilol 6.25 mg po bid  Follow up in 2 months.   [name], MD, [facility] 07/21/2015, 1:47 PM

## 2015-07-30 ENCOUNTER — Ambulatory Visit (HOSPITAL_COMMUNITY)
Admission: RE | Admit: 2015-07-30 | Discharge: 2015-07-30 | Disposition: A | Discharge status: Home / Self Care | Payer: Medicare Other | Source: Ambulatory Visit | Attending: Interventional Cardiology | Admitting: Interventional Cardiology

## 2015-07-30 ENCOUNTER — Encounter (HOSPITAL_COMMUNITY): Payer: Self-pay | Admitting: Interventional Cardiology

## 2015-07-30 ENCOUNTER — Encounter (HOSPITAL_COMMUNITY)
Admission: RE | Disposition: A | Discharge status: Home / Self Care | Payer: Self-pay | Source: Ambulatory Visit | Attending: Interventional Cardiology

## 2015-07-30 DIAGNOSIS — I10 Essential (primary) hypertension: Secondary | ICD-10-CM | POA: Diagnosis present

## 2015-07-30 DIAGNOSIS — Z79899 Other long term (current) drug therapy: Secondary | ICD-10-CM | POA: Diagnosis not present

## 2015-07-30 DIAGNOSIS — I208 Other forms of angina pectoris: Secondary | ICD-10-CM

## 2015-07-30 DIAGNOSIS — Z8673 Personal history of transient ischemic attack (TIA), and cerebral infarction without residual deficits: Secondary | ICD-10-CM | POA: Diagnosis not present

## 2015-07-30 DIAGNOSIS — I472 Ventricular tachycardia, unspecified: Secondary | ICD-10-CM

## 2015-07-30 DIAGNOSIS — Z87891 Personal history of nicotine dependence: Secondary | ICD-10-CM | POA: Diagnosis not present

## 2015-07-30 DIAGNOSIS — I6350 Cerebral infarction due to unspecified occlusion or stenosis of unspecified cerebral artery: Secondary | ICD-10-CM | POA: Diagnosis present

## 2015-07-30 DIAGNOSIS — Z7901 Long term (current) use of anticoagulants: Secondary | ICD-10-CM

## 2015-07-30 DIAGNOSIS — I482 Chronic atrial fibrillation, unspecified: Secondary | ICD-10-CM

## 2015-07-30 DIAGNOSIS — R55 Syncope and collapse: Secondary | ICD-10-CM | POA: Diagnosis present

## 2015-07-30 DIAGNOSIS — Z955 Presence of coronary angioplasty implant and graft: Secondary | ICD-10-CM | POA: Diagnosis not present

## 2015-07-30 DIAGNOSIS — E785 Hyperlipidemia, unspecified: Secondary | ICD-10-CM | POA: Diagnosis not present

## 2015-07-30 DIAGNOSIS — I4729 Other ventricular tachycardia: Secondary | ICD-10-CM

## 2015-07-30 DIAGNOSIS — Z7982 Long term (current) use of aspirin: Secondary | ICD-10-CM | POA: Insufficient documentation

## 2015-07-30 DIAGNOSIS — I25118 Atherosclerotic heart disease of native coronary artery with other forms of angina pectoris: Secondary | ICD-10-CM | POA: Diagnosis not present

## 2015-07-30 DIAGNOSIS — R9439 Abnormal result of other cardiovascular function study: Secondary | ICD-10-CM | POA: Diagnosis present

## 2015-07-30 DIAGNOSIS — I5023 Acute on chronic systolic (congestive) heart failure: Secondary | ICD-10-CM | POA: Insufficient documentation

## 2015-07-30 DIAGNOSIS — I4891 Unspecified atrial fibrillation: Secondary | ICD-10-CM | POA: Diagnosis not present

## 2015-07-30 DIAGNOSIS — I426 Alcoholic cardiomyopathy: Secondary | ICD-10-CM | POA: Insufficient documentation

## 2015-07-30 DIAGNOSIS — I4819 Other persistent atrial fibrillation: Secondary | ICD-10-CM | POA: Diagnosis present

## 2015-07-30 DIAGNOSIS — I2510 Atherosclerotic heart disease of native coronary artery without angina pectoris: Secondary | ICD-10-CM | POA: Diagnosis present

## 2015-07-30 HISTORY — PX: CARDIAC CATHETERIZATION: SHX172

## 2015-07-30 LAB — PROTIME-INR
INR: 1.14 (ref 0.00–1.49)
Prothrombin Time: 14.8 seconds (ref 11.6–15.2)

## 2015-07-30 SURGERY — LEFT HEART CATH AND CORONARY ANGIOGRAPHY
Anesthesia: LOCAL

## 2015-07-30 MED ORDER — VERAPAMIL HCL 2.5 MG/ML IV SOLN
INTRAVENOUS | Status: AC
  Filled 2015-07-30: qty 2

## 2015-07-30 MED ORDER — FENTANYL CITRATE (PF) 100 MCG/2ML IJ SOLN
INTRAMUSCULAR | Status: AC
  Filled 2015-07-30: qty 2

## 2015-07-30 MED ORDER — SODIUM CHLORIDE 0.9 % WEIGHT BASED INFUSION
3.0000 mL/kg/h | INTRAVENOUS | Status: AC
  Administered 2015-07-30: 3 mL/kg/h via INTRAVENOUS

## 2015-07-30 MED ORDER — SODIUM CHLORIDE 0.9 % IV SOLN: 250.0000 mL | INTRAVENOUS | Status: DC | PRN

## 2015-07-30 MED ORDER — FENTANYL CITRATE (PF) 100 MCG/2ML IJ SOLN
INTRAMUSCULAR | Status: DC | PRN
  Administered 2015-07-30: 50 ug via INTRAVENOUS

## 2015-07-30 MED ORDER — IOHEXOL 350 MG/ML SOLN
INTRAVENOUS | Status: DC | PRN
  Administered 2015-07-30: 100 mL via INTRA_ARTERIAL

## 2015-07-30 MED ORDER — LIDOCAINE HCL (PF) 1 % IJ SOLN
INTRAMUSCULAR | Status: DC | PRN
  Administered 2015-07-30: 08:00:00

## 2015-07-30 MED ORDER — SODIUM CHLORIDE 0.9 % IJ SOLN: 3.0000 mL | INTRAMUSCULAR | Status: DC | PRN

## 2015-07-30 MED ORDER — ONDANSETRON HCL 4 MG/2ML IJ SOLN: 4.0000 mg | Freq: Four times a day (QID) | INTRAMUSCULAR | Status: DC | PRN

## 2015-07-30 MED ORDER — SODIUM CHLORIDE 0.9 % IJ SOLN: 3.0000 mL | Freq: Two times a day (BID) | INTRAMUSCULAR | Status: DC

## 2015-07-30 MED ORDER — HEPARIN SODIUM (PORCINE) 1000 UNIT/ML IJ SOLN
INTRAMUSCULAR | Status: DC | PRN
  Administered 2015-07-30: 5000 [IU] via INTRAVENOUS

## 2015-07-30 MED ORDER — LIDOCAINE HCL (PF) 1 % IJ SOLN
INTRAMUSCULAR | Status: AC
  Filled 2015-07-30: qty 30

## 2015-07-30 MED ORDER — SODIUM CHLORIDE 0.9 % WEIGHT BASED INFUSION: 1.0000 mL/kg/h | INTRAVENOUS | Status: DC

## 2015-07-30 MED ORDER — ASPIRIN 81 MG PO CHEW
81.0000 mg | CHEWABLE_TABLET | ORAL | Status: AC
  Administered 2015-07-30: 81 mg via ORAL

## 2015-07-30 MED ORDER — ASPIRIN 81 MG PO CHEW
CHEWABLE_TABLET | ORAL | Status: AC
  Administered 2015-07-30: 81 mg via ORAL
  Filled 2015-07-30: qty 1

## 2015-07-30 MED ORDER — MIDAZOLAM HCL 2 MG/2ML IJ SOLN
INTRAMUSCULAR | Status: DC | PRN
  Administered 2015-07-30: 1 mg via INTRAVENOUS

## 2015-07-30 MED ORDER — HEPARIN SODIUM (PORCINE) 1000 UNIT/ML IJ SOLN
INTRAMUSCULAR | Status: AC
  Filled 2015-07-30: qty 1

## 2015-07-30 MED ORDER — HEPARIN (PORCINE) IN NACL 2-0.9 UNIT/ML-% IJ SOLN
INTRAMUSCULAR | Status: AC
  Filled 2015-07-30: qty 1000

## 2015-07-30 MED ORDER — WARFARIN SODIUM 2.5 MG PO TABS: 2.5000 mg | ORAL_TABLET | Freq: Once | ORAL | Status: DC

## 2015-07-30 MED ORDER — ACETAMINOPHEN 325 MG PO TABS: 650.0000 mg | ORAL_TABLET | ORAL | Status: DC | PRN

## 2015-07-30 MED ORDER — MIDAZOLAM HCL 2 MG/2ML IJ SOLN
INTRAMUSCULAR | Status: AC
  Filled 2015-07-30: qty 2

## 2015-07-30 MED ORDER — VERAPAMIL HCL 2.5 MG/ML IV SOLN
INTRAVENOUS | Status: DC | PRN
  Administered 2015-07-30: 08:00:00 via INTRA_ARTERIAL

## 2015-07-30 SURGICAL SUPPLY — 9 items
CATH INFINITI 5 FR JL3.5 (CATHETERS) ×2 IMPLANT
CATH INFINITI JR4 5F (CATHETERS) ×2 IMPLANT
DEVICE RAD COMP TR BAND LRG (VASCULAR PRODUCTS) ×2 IMPLANT
GLIDESHEATH SLEND A-KIT 6F 22G (SHEATH) ×2 IMPLANT
KIT HEART LEFT (KITS) ×2 IMPLANT
PACK CARDIAC CATHETERIZATION (CUSTOM PROCEDURE TRAY) ×2 IMPLANT
TRANSDUCER W/STOPCOCK (MISCELLANEOUS) ×2 IMPLANT
TUBING CIL FLEX 10 FLL-RA (TUBING) ×2 IMPLANT
WIRE SAFE-T 1.5MM-J .035X260CM (WIRE) ×2 IMPLANT

## 2015-07-30 NOTE — Progress Notes (Signed)
TR BAND REMOVAL  LOCATION:    Right radial   DEFLATED PER PROTOCOL:    Yes.    TIME BAND OFF / DRESSING APPLIED:    1030am   SITE UPON ARRIVAL:    Level 0  SITE AFTER BAND REMOVAL:    Level 0  CIRCULATION SENSATION AND MOVEMENT:    Within Normal Limits   Yes.    COMMENTS:   VS remain stable.  No problems at right radial site at this time.

## 2015-07-30 NOTE — Discharge Instructions (Addendum)
Radial Site Care °Refer to this sheet in the next few weeks. These instructions provide you with information about caring for yourself after your procedure. Your health care provider may also give you more specific instructions. Your treatment has been planned according to current medical practices, but problems sometimes occur. Call your health care provider if you have any problems or questions after your procedure. °WHAT TO EXPECT AFTER THE PROCEDURE °After your procedure, it is typical to have the following: °· Bruising at the radial site that usually fades within 1-2 weeks. °· Blood collecting in the tissue (hematoma) that may be painful to the touch. It should usually decrease in size and tenderness within 1-2 weeks. °HOME CARE INSTRUCTIONS °· Take medicines only as directed by your health care provider. °· You may shower 24-48 hours after the procedure or as directed by your health care provider. Remove the bandage (dressing) and gently wash the site with plain soap and water. Pat the area dry with a clean towel. Do not rub the site, because this may cause bleeding. °· Do not take baths, swim, or use a hot tub until your health care provider approves. °· Check your insertion site every day for redness, swelling, or drainage. °· Do not apply powder or lotion to the site. °· Do not flex or bend the affected arm for 24 hours or as directed by your health care provider. °· Do not push or pull heavy objects with the affected arm for 24 hours or as directed by your health care provider. °· Do not lift over 10 lb (4.5 kg) for 5 days after your procedure or as directed by your health care provider. °· Ask your health care provider when it is okay to: °¨ Return to work or school. °¨ Resume usual physical activities or sports. °¨ Resume sexual activity. °· Do not drive home if you are discharged the same day as the procedure. Have someone else drive you. °· You may drive 24 hours after the procedure unless otherwise  instructed by your health care provider. °· Do not operate machinery or power tools for 24 hours after the procedure. °· If your procedure was done as an outpatient procedure, which means that you went home the same day as your procedure, a responsible adult should be with you for the first 24 hours after you arrive home. °· Keep all follow-up visits as directed by your health care provider. This is important. °SEEK MEDICAL CARE IF: °· You have a fever. °· You have chills. °· You have increased bleeding from the radial site. Hold pressure on the site. °SEEK IMMEDIATE MEDICAL CARE IF: °· You have unusual pain at the radial site. °· You have redness, warmth, or swelling at the radial site. °· You have drainage (other than a small amount of blood on the dressing) from the radial site. °· The radial site is bleeding, and the bleeding does not stop after 30 minutes of holding steady pressure on the site. °· Your arm or hand becomes pale, cool, tingly, or numb. °  °This information is not intended to replace advice given to you by your health care provider. Make sure you discuss any questions you have with your health care provider. °  °Document Released: 09/17/2010 Document Revised: 09/05/2014 Document Reviewed: 03/03/2014 °Elsevier Interactive Patient Education ©2016 [employer] ° °

## 2015-08-04 ENCOUNTER — Ambulatory Visit (INDEPENDENT_AMBULATORY_CARE_PROVIDER_SITE_OTHER): Payer: Medicare Other | Admitting: Pharmacist

## 2015-08-04 ENCOUNTER — Other Ambulatory Visit: Payer: Self-pay | Admitting: Cardiology

## 2015-08-04 DIAGNOSIS — I6350 Cerebral infarction due to unspecified occlusion or stenosis of unspecified cerebral artery: Secondary | ICD-10-CM | POA: Diagnosis not present

## 2015-08-04 DIAGNOSIS — I482 Chronic atrial fibrillation, unspecified: Secondary | ICD-10-CM

## 2015-08-04 DIAGNOSIS — I4891 Unspecified atrial fibrillation: Secondary | ICD-10-CM

## 2015-08-04 DIAGNOSIS — Z5181 Encounter for therapeutic drug level monitoring: Secondary | ICD-10-CM | POA: Diagnosis not present

## 2015-08-04 DIAGNOSIS — Z7901 Long term (current) use of anticoagulants: Secondary | ICD-10-CM | POA: Diagnosis not present

## 2015-08-04 LAB — POCT INR: INR: 1.1

## 2015-08-10 ENCOUNTER — Ambulatory Visit (INDEPENDENT_AMBULATORY_CARE_PROVIDER_SITE_OTHER): Payer: Medicare Other | Admitting: *Deleted

## 2015-08-10 DIAGNOSIS — I482 Chronic atrial fibrillation, unspecified: Secondary | ICD-10-CM

## 2015-08-10 DIAGNOSIS — Z5181 Encounter for therapeutic drug level monitoring: Secondary | ICD-10-CM

## 2015-08-10 DIAGNOSIS — I6350 Cerebral infarction due to unspecified occlusion or stenosis of unspecified cerebral artery: Secondary | ICD-10-CM | POA: Diagnosis not present

## 2015-08-10 LAB — POCT INR: INR: 1.5

## 2015-08-10 NOTE — Patient Instructions (Addendum)
08/10/15- Take 2 Coumadin tablets- Lovenox given in office. Lovenox 150mg s SQm given into Rt upper abd quad. Lot # [id number], Exp 01/2016  08/11/15- Take 2 1/2  Coumadin tablets- Lovenox 100mg s at 8am and 8pm  08/12/15- Take 1 tablet of Coumadin- Lovenox 100mg s at 8am and 8pm  08/13/15- Take 2 tablets of Coumadin - Take 150mg s of Lovenox at 8am, No more that day.   08/14/15- Coumadin appointment.

## 2015-08-11 ENCOUNTER — Other Ambulatory Visit: Payer: Self-pay | Admitting: *Deleted

## 2015-08-14 ENCOUNTER — Ambulatory Visit (INDEPENDENT_AMBULATORY_CARE_PROVIDER_SITE_OTHER): Payer: Medicare Other | Admitting: *Deleted

## 2015-08-14 DIAGNOSIS — I482 Chronic atrial fibrillation, unspecified: Secondary | ICD-10-CM

## 2015-08-14 DIAGNOSIS — Z5181 Encounter for therapeutic drug level monitoring: Secondary | ICD-10-CM

## 2015-08-14 DIAGNOSIS — Z7901 Long term (current) use of anticoagulants: Secondary | ICD-10-CM

## 2015-08-14 DIAGNOSIS — I6350 Cerebral infarction due to unspecified occlusion or stenosis of unspecified cerebral artery: Secondary | ICD-10-CM

## 2015-08-14 DIAGNOSIS — I4891 Unspecified atrial fibrillation: Secondary | ICD-10-CM

## 2015-08-14 LAB — POCT INR: INR: 1.8

## 2015-08-20 ENCOUNTER — Ambulatory Visit (INDEPENDENT_AMBULATORY_CARE_PROVIDER_SITE_OTHER): Payer: Medicare Other | Admitting: *Deleted

## 2015-08-20 ENCOUNTER — Ambulatory Visit: Payer: Medicare Other | Admitting: Neurology

## 2015-08-20 DIAGNOSIS — I4891 Unspecified atrial fibrillation: Secondary | ICD-10-CM

## 2015-08-20 DIAGNOSIS — Z7901 Long term (current) use of anticoagulants: Secondary | ICD-10-CM

## 2015-08-20 DIAGNOSIS — I482 Chronic atrial fibrillation, unspecified: Secondary | ICD-10-CM

## 2015-08-20 DIAGNOSIS — I6350 Cerebral infarction due to unspecified occlusion or stenosis of unspecified cerebral artery: Secondary | ICD-10-CM | POA: Diagnosis not present

## 2015-08-20 DIAGNOSIS — Z5181 Encounter for therapeutic drug level monitoring: Secondary | ICD-10-CM | POA: Diagnosis not present

## 2015-08-20 LAB — POCT INR: INR: 1.7

## 2015-08-27 ENCOUNTER — Ambulatory Visit (INDEPENDENT_AMBULATORY_CARE_PROVIDER_SITE_OTHER): Payer: Medicare Other | Admitting: *Deleted

## 2015-08-27 DIAGNOSIS — I6350 Cerebral infarction due to unspecified occlusion or stenosis of unspecified cerebral artery: Secondary | ICD-10-CM | POA: Diagnosis not present

## 2015-08-27 DIAGNOSIS — I4891 Unspecified atrial fibrillation: Secondary | ICD-10-CM

## 2015-08-27 DIAGNOSIS — Z5181 Encounter for therapeutic drug level monitoring: Secondary | ICD-10-CM

## 2015-08-27 DIAGNOSIS — I482 Chronic atrial fibrillation, unspecified: Secondary | ICD-10-CM

## 2015-08-27 DIAGNOSIS — Z7901 Long term (current) use of anticoagulants: Secondary | ICD-10-CM | POA: Diagnosis not present

## 2015-08-27 LAB — POCT INR: INR: 2

## 2015-09-10 ENCOUNTER — Ambulatory Visit (INDEPENDENT_AMBULATORY_CARE_PROVIDER_SITE_OTHER): Payer: Medicare Other | Admitting: Pharmacist

## 2015-09-10 DIAGNOSIS — I4891 Unspecified atrial fibrillation: Secondary | ICD-10-CM

## 2015-09-10 DIAGNOSIS — Z5181 Encounter for therapeutic drug level monitoring: Secondary | ICD-10-CM | POA: Diagnosis not present

## 2015-09-10 DIAGNOSIS — Z7901 Long term (current) use of anticoagulants: Secondary | ICD-10-CM | POA: Diagnosis not present

## 2015-09-10 DIAGNOSIS — I482 Chronic atrial fibrillation, unspecified: Secondary | ICD-10-CM

## 2015-09-10 DIAGNOSIS — I6350 Cerebral infarction due to unspecified occlusion or stenosis of unspecified cerebral artery: Secondary | ICD-10-CM | POA: Diagnosis not present

## 2015-09-10 LAB — POCT INR: INR: 2.4

## 2015-09-25 ENCOUNTER — Ambulatory Visit (INDEPENDENT_AMBULATORY_CARE_PROVIDER_SITE_OTHER): Payer: Medicare Other | Admitting: Neurology

## 2015-09-25 ENCOUNTER — Encounter: Payer: Self-pay | Admitting: Neurology

## 2015-09-25 VITALS — BP 135/68 | HR 90 | Ht 69.0 in | Wt 215.0 lb

## 2015-09-25 DIAGNOSIS — R269 Unspecified abnormalities of gait and mobility: Secondary | ICD-10-CM

## 2015-09-25 DIAGNOSIS — M21372 Foot drop, left foot: Secondary | ICD-10-CM | POA: Diagnosis not present

## 2015-09-25 DIAGNOSIS — G5732 Lesion of lateral popliteal nerve, left lower limb: Secondary | ICD-10-CM

## 2015-09-25 DIAGNOSIS — I69359 Hemiplegia and hemiparesis following cerebral infarction affecting unspecified side: Secondary | ICD-10-CM | POA: Diagnosis not present

## 2015-09-25 DIAGNOSIS — I69398 Other sequelae of cerebral infarction: Secondary | ICD-10-CM

## 2015-09-25 HISTORY — DX: Hemiplegia and hemiparesis following cerebral infarction affecting unspecified side: I69.359

## 2015-09-25 HISTORY — DX: Lesion of lateral popliteal nerve, left lower limb: G57.32

## 2015-09-25 HISTORY — DX: Hemiplegia and hemiparesis following cerebral infarction affecting unspecified side: I69.398

## 2015-09-25 NOTE — Progress Notes (Signed)
Reason for visit: Left foot drop  KAITLIN POORMAN is an 80 y.o. male  History of present illness:   Mr. Rahming is a 80 year old right-handed black male with a history of cerebrovascular disease with a large right brain stroke in the past with minimal deficit. The patient does have a residual left homonymous visual field deficit. The patient has developed a foot drop in the summer of 2016. The foot drop was determined to be a left peroneal neuropathy that was fairly severe at that time. The patient has gotten an AFO brace on the left side in December 2016. He finds that he walks fairly well with this. He denies discomfort in the back or pain down the leg. He denies any numbness in the foot. He returns for an evaluation. He has not had any recent falls.  Past Medical History  Diagnosis Date  . Hypertension   . Stroke ([place])   . Syncope and collapse   . Hyperlipidemia   . Coronary artery disease   . Atrial fibrillation ([place])   . V-tach ([place])   . Bradycardia   . Alcoholic cardiomyopathy ([place])   . Pulmonary eosinophilia ([place])   . Unspecified glaucoma   . GERD (gastroesophageal reflux disease)   . Long term (current) use of anticoagulants   . Gout   . Foot drop, left 03/11/2015  . Gait disorder 03/11/2015  . 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 Hemiparesis and alteration of sensations as late effects of stroke ([place]) 09/25/2015    Past Surgical History  Procedure Laterality Date  . Loop recorder  09/2007  . Knee surgery    . Cardiac catheterization N/A 07/30/2015    Procedure: Left Heart Cath and Coronary Angiography;  Surgeon: Belva Crome, MD;  Location: [place] [facility];  Service: Cardiovascular;  Laterality: N/A;    Family History  Problem Relation Age of Onset  . Stroke Neg Hx   . Colon cancer Mother   . Hypertension Mother   . Cancer Mother   . Prostate cancer Brother   . Cancer Brother   . Heart  disease Father   . Heart attack Father   . Hypertension Father   . Heart attack Sister   . Cancer Sister     Social history:  reports that he quit smoking about 32 years ago. He has never used smokeless tobacco. He reports that he does not drink alcohol or use illicit drugs.   No Known Allergies  Medications:  Prior to Admission medications   Medication Sig Start Date End Date Taking? Authorizing Provider  aspirin 81 MG chewable tablet Chew 81 mg by mouth daily.    Historical Provider, MD  atorvastatin (LIPITOR) 40 MG tablet TAKE 1 TABLET BY MOUTH DAILY 05/27/15   [name], MD  brimonidine (ALPHAGAN P) 0.1 % SOLN Place 1 drop into both eyes 2 (two) times daily.     Historical Provider, MD  carvedilol (COREG) 6.25 MG tablet TAKE 1 TABLET BY MOUTH TWICE DAILY 12/17/14   [name], MD  colchicine 0.6 MG tablet Take 0.6 mg by mouth daily.     Historical Provider, MD  enoxaparin (LOVENOX) 100 MG/ML injection Inject 1 mL (100 mg total) into the skin every 12 (twelve) hours. 07/21/15   [name], MD  famotidine (PEPCID) 20 MG tablet Take 20 mg by mouth 2 (two) times daily.  Historical Provider, MD  ferrous sulfate 325 (65 FE) MG tablet Take 325 mg by mouth daily with breakfast.    Historical Provider, MD  fluocinonide cream (LIDEX) [id number] % Apply 1 application topically daily as needed (itching).  01/13/15   Historical Provider, MD  furosemide (LASIX) 20 MG tablet Take 1 tablet (20 mg total) by mouth daily. 07/21/15   [name], MD  gabapentin (NEURONTIN) 300 MG capsule Take 300 mg by mouth 2 (two) times daily.    Historical Provider, MD  hydrocortisone (ANUSOL-HC) 2.5 % rectal cream Place 1 application rectally 2 (two) times daily as needed for hemorrhoids or itching.    Historical Provider, MD  hydrocortisone (PROCTOZONE-HC) 2.5 % rectal cream 1 application 2 (two) times daily as needed for hemorrhoids or itching.  03/14/14   Historical Provider, MD  hydrocortisone  cream 1 % Apply 1 application topically 2 (two) times daily as needed (for eczmea).    Historical Provider, MD  irbesartan (AVAPRO) 300 MG tablet Take 1 tablet (300 mg total) by mouth at bedtime. 07/21/15   [name], MD  latanoprost (XALATAN) 0.005 % ophthalmic solution Place 1 drop into both eyes at bedtime.    Historical Provider, MD  loratadine (CLARITIN) 10 MG tablet Take 10 mg by mouth daily.      Historical Provider, MD  meclizine (ANTIVERT) 25 MG tablet Take 25 mg by mouth 3 (three) times daily as needed for dizziness.    Historical Provider, MD  nitroGLYCERIN (NITROLINGUAL) 0.4 MG/SPRAY spray Place 1 spray under the tongue every 5 (five) minutes x 3 doses as needed for chest pain.    Historical Provider, MD  Omega-3 Fatty Acids (FISH OIL) 1000 MG CAPS Take 1,000 mg by mouth 2 (two) times daily.     Historical Provider, MD  spironolactone (ALDACTONE) 25 MG tablet Take 25 mg by mouth daily.    Historical Provider, MD  spironolactone (ALDACTONE) 25 MG tablet TAKE 1 TABLET BY MOUTH EVERY DAY 08/05/15   [name], MD  Tamsulosin HCl (FLOMAX) 0.4 MG CAPS Take 0.4 mg by mouth daily. 12/28/11   Historical Provider, MD  triamcinolone cream (KENALOG) 0.1 % Apply 1 application topically 2 (two) times daily. Eczema 01/20/15   Historical Provider, MD  warfarin (COUMADIN) 2 MG tablet TAKE AS DIRECTED BY COUMADIN CLINIC 06/08/15   [name], MD    ROS:  Out of a complete 14 system review of symptoms, the patient complains only of the following symptoms, and all other reviewed systems are negative.   Runny nose  Leg swelling  Daytime sleepiness  Skin rash, itching  Blood pressure 135/68, pulse 90, height 5\' 9"  (1.753 m), weight 215 lb (97.523 kg).  Physical Exam  General: The patient is alert and cooperative at the time of the examination.  Skin:  1+ edema below the knees is seen bilaterally.   Neurologic Exam  Mental status: The patient is alert and oriented x 3 at the  time of the examination. The patient has apparent normal recent and remote memory, with an apparently normal attention span and concentration ability.   Cranial nerves: Facial symmetry is present. Speech is normal, no aphasia or dysarthria is noted. Extraocular movements are full. Visual fields are notable for a left homonymous visual field deficit.  Motor: The patient has good strength in all 4 extremities, with exception of some mild weakness of the left foot with dorsiflexion, the patient is able to flex and extend the toes fairly  well, good inversion and eversion strength.  Sensory examination: Soft touch sensation is symmetric on the face, arms, and legs.  Coordination: The patient has good finger-nose-finger and heel-to-shin bilaterally.  Gait and station: The patient has a normal gait. Tandem gait could not be performed secondary to apraxia. Romberg is negative. No drift is seen.  Reflexes: Deep tendon reflexes are symmetric, but are depressed.   Assessment/Plan:   1. History of cerebrovascular disease, prior right brain stroke   2. Left footdrop, peroneal neuropathy   The clinical examination today shows good improvement in the left peroneal neuropathy. The patient does have an AFO brace on the left foot, but he likely will not require the brace long-term. I suspect within the next several months the strength of the left drop will return to normal. The patient will follow-up through this office on an as-needed basis.  Jill Alexanders MD 09/25/2015 4:49 PM  [facility] [address] [place], [place] [address]  Phone [phone] Fax [phone]

## 2015-09-25 NOTE — Patient Instructions (Signed)
Fall Prevention in the Home  Falls can cause injuries and can affect people from all age groups. There are many simple things that you can do to make your home safe and to help prevent falls. WHAT CAN I DO ON THE OUTSIDE OF MY HOME?  Regularly repair the edges of walkways and driveways and fix any cracks.  Remove high doorway thresholds.  Trim any shrubbery on the main path into your home.  Use bright outdoor lighting.  Clear walkways of debris and clutter, including tools and rocks.  Regularly check that handrails are securely fastened and in good repair. Both sides of any steps should have handrails.  Install guardrails along the edges of any raised decks or porches.  Have leaves, snow, and ice cleared regularly.  Use sand or salt on walkways during winter months.  In the garage, clean up any spills right away, including grease or oil spills. WHAT CAN I DO IN THE BATHROOM?  Use night lights.  Install grab bars by the toilet and in the tub and shower. Do not use towel bars as grab bars.  Use non-skid mats or decals on the floor of the tub or shower.  If you need to sit down while you are in the shower, use a plastic, non-slip stool..  Keep the floor dry. Immediately clean up any water that spills on the floor.  Remove soap buildup in the tub or shower on a regular basis.  Attach bath mats securely with double-sided non-slip rug tape.  Remove throw rugs and other tripping hazards from the floor. WHAT CAN I DO IN THE BEDROOM?  Use night lights.  Make sure that a bedside light is easy to reach.  Do not use oversized bedding that drapes onto the floor.  Have a firm chair that has side arms to use for getting dressed.  Remove throw rugs and other tripping hazards from the floor. WHAT CAN I DO IN THE KITCHEN?   Clean up any spills right away.  Avoid walking on wet floors.  Place frequently used items in easy-to-reach places.  If you need to reach for something  above you, use a sturdy step stool that has a grab bar.  Keep electrical cables out of the way.  Do not use floor polish or wax that makes floors slippery. If you have to use wax, make sure that it is non-skid floor wax.  Remove throw rugs and other tripping hazards from the floor. WHAT CAN I DO IN THE STAIRWAYS?  Do not leave any items on the stairs.  Make sure that there are handrails on both sides of the stairs. Fix handrails that are broken or loose. Make sure that handrails are as long as the stairways.  Check any carpeting to make sure that it is firmly attached to the stairs. Fix any carpet that is loose or worn.  Avoid having throw rugs at the top or bottom of stairways, or secure the rugs with carpet tape to prevent them from moving.  Make sure that you have a light switch at the top of the stairs and the bottom of the stairs. If you do not have them, have them installed. WHAT ARE SOME OTHER FALL PREVENTION TIPS?  Wear closed-toe shoes that fit well and support your feet. Wear shoes that have rubber soles or low heels.  When you use a stepladder, make sure that it is completely opened and that the sides are firmly locked. Have someone hold the ladder while you   are using it. Do not climb a closed stepladder.  Add color or contrast paint or tape to grab bars and handrails in your home. Place contrasting color strips on the first and last steps.  Use mobility aids as needed, such as canes, walkers, scooters, and crutches.  Turn on lights if it is dark. Replace any light bulbs that burn out.  Set up furniture so that there are clear paths. Keep the furniture in the same spot.  Fix any uneven floor surfaces.  Choose a carpet design that does not hide the edge of steps of a stairway.  Be aware of any and all pets.  Review your medicines with your healthcare provider. Some medicines can cause dizziness or changes in blood pressure, which increase your risk of falling. Talk  with your health care provider about other ways that you can decrease your risk of falls. This may include working with a physical therapist or trainer to improve your strength, balance, and endurance.   This information is not intended to replace advice given to you by your health care provider. Make sure you discuss any questions you have with your health care provider.   Document Released: 08/05/2002 Document Revised: 12/30/2014 Document Reviewed: 09/19/2014 Elsevier Interactive Patient Education 2016 [employer]  

## 2015-09-28 ENCOUNTER — Ambulatory Visit (INDEPENDENT_AMBULATORY_CARE_PROVIDER_SITE_OTHER): Payer: Medicare Other | Admitting: Cardiology

## 2015-09-28 ENCOUNTER — Encounter: Payer: Self-pay | Admitting: Cardiology

## 2015-09-28 VITALS — BP 134/72 | HR 71 | Ht 69.0 in | Wt 214.0 lb

## 2015-09-28 DIAGNOSIS — I5043 Acute on chronic combined systolic (congestive) and diastolic (congestive) heart failure: Secondary | ICD-10-CM

## 2015-09-28 DIAGNOSIS — I255 Ischemic cardiomyopathy: Secondary | ICD-10-CM

## 2015-09-28 DIAGNOSIS — E785 Hyperlipidemia, unspecified: Secondary | ICD-10-CM

## 2015-09-28 DIAGNOSIS — I10 Essential (primary) hypertension: Secondary | ICD-10-CM

## 2015-09-28 DIAGNOSIS — I2510 Atherosclerotic heart disease of native coronary artery without angina pectoris: Secondary | ICD-10-CM

## 2015-09-28 DIAGNOSIS — I2583 Coronary atherosclerosis due to lipid rich plaque: Secondary | ICD-10-CM

## 2015-09-28 MED ORDER — ISOSORBIDE MONONITRATE ER 30 MG PO TB24: 15.0000 mg | ORAL_TABLET | Freq: Every day | ORAL | Status: DC

## 2015-09-28 MED ORDER — ATORVASTATIN CALCIUM 40 MG PO TABS: 40.0000 mg | ORAL_TABLET | Freq: Every day | ORAL | Status: DC

## 2015-09-28 NOTE — Progress Notes (Signed)
Patient ID: REED DIDOMENICO, male   DOB: 1935-11-29, 80 y.o.   MRN: [id number]    Patient Name: Vincent [name] Date of Encounter: 09/28/2015  Primary Care Provider:  [name], MD Primary Cardiologist:  [name]  Problem List   Past Medical History  Diagnosis Date  . Hypertension   . Stroke ([place])   . Syncope and collapse   . Hyperlipidemia   . Coronary artery disease   . Atrial fibrillation ([place])   . V-tach ([place])   . Bradycardia   . Alcoholic cardiomyopathy ([place])   . Pulmonary eosinophilia ([place])   . Unspecified glaucoma   . GERD (gastroesophageal reflux disease)   . Long term (current) use of anticoagulants   . Gout   . Foot drop, left 03/11/2015  . Gait disorder 03/11/2015  . 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 Hemiparesis and alteration of sensations as late effects of stroke ([place]) 09/25/2015   Past Surgical History  Procedure Laterality Date  . Loop recorder  09/2007  . Knee surgery    . Cardiac catheterization N/A 07/30/2015    Procedure: Left Heart Cath and Coronary Angiography;  Surgeon: Belva Crome, MD;  Location: [facility];  Service: Cardiovascular;  Laterality: N/A;    Allergies  No Known Allergies  HPI  Vincent [name] returns today for evaluation and management of his complex cardiovascular history, ischemic and nonischemic cardiomyopathy with prior PCI and previously depressed but now normal left ventricular function by echo in 2009.  He is also seen in the devise clinic for implantation of a loop recorder in February 2009 due to spells of dizziness described as presyncopal versus partial seizures. He has had recurrent episodes while his ILR was functioning and there were no arrhythmias documented correlating to his symptoms. His ILR was deemed non-functioning back in April 2013 due to battery depletion as it was implanted in 2009.   The patient was admitted in July  2015 for dizziness/near-syncope, etiology unclear.  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 He was not able to get ENT referral yet.  -  2D ECHO done 7/21 = EF 40-45% with diffuse hypokinesis. Nuclear stress test showed an old MI but no ischemia.  He reports compliance with his medications. No CP or SOB. His sister (who is also my patient) states that they recently lost their sister, father and a niece and that the patient seems depressed. He denies and doesn't want to be treated for it.  07/21/2015 - the patient is coming after 1 year, he has been compliant with his meds, but has been experiencing worsening chest pain on exertion like walking uphill. His stress test in 02/2014 showed inferior scar with mild inferolateral ischemia, but he was minimally symptomatic at that time. He has also noticed mild LE edema, no orthopnea, PND, no palpitations or syncope.   09/28/2015 - the patient was referred for left cardiac catheterization that he underwent on 07/30/2015 and was found to have progression of the distal left circumflex artery currently 70-75% stenosis. Dr. [name] felt that because this is a distal vessel that supplies small portion of the myocardium and if his stents he would require anticoagulation and dual antiplatelet therapy that would put him at high risk of bleeding. The patient comes today he states that he is having on  ongoing chest pain with moderate exertion like carrying heavy things or shoveling snow. He denies any palpitations or syncope no bleeding no lower extremity edema orthopnea or proximal nocturnal dyspnea.   Home Medications  Prior to Admission medications   Medication Sig Start Date End Date Taking? Authorizing Provider  aspirin 81 MG chewable tablet Chew 81 mg by mouth daily.   Yes Historical Provider, MD  atorvastatin (LIPITOR) 40 MG tablet Take 40 mg by  mouth daily at 6 PM.   Yes Historical Provider, MD  bimatoprost (LUMIGAN) 0.03 % ophthalmic solution Place 1 drop into both eyes at bedtime.    Yes Historical Provider, MD  brimonidine (ALPHAGAN P) 0.1 % SOLN Place 1 drop into the right eye 2 (two) times daily.    Yes Historical Provider, MD  colchicine 0.6 MG tablet Take 0.6 mg by mouth daily.    Yes Historical Provider, MD  famotidine (PEPCID) 20 MG tablet Take 20 mg by mouth 2 (two) times daily.   Yes Historical Provider, MD  ferrous sulfate 325 (65 FE) MG tablet Take 325 mg by mouth daily with breakfast.   Yes Historical Provider, MD  hydrocortisone (ANUSOL-HC) 2.5 % rectal cream Place 1 application rectally 2 (two) times daily as needed for hemorrhoids or itching.   Yes Historical Provider, MD  hydrocortisone cream 1 % Apply 1 application topically 2 (two) times daily as needed (for eczmea).   Yes Historical Provider, MD  irbesartan (AVAPRO) 300 MG tablet Take 300 mg by mouth daily.   Yes Historical Provider, MD  loratadine (CLARITIN) 10 MG tablet Take 10 mg by mouth daily.     Yes Historical Provider, MD  meclizine (ANTIVERT) 25 MG tablet Take 25 mg by mouth 3 (three) times daily as needed for dizziness.   Yes Historical Provider, MD  nitroGLYCERIN (NITROLINGUAL) 0.4 MG/SPRAY spray Place 1 spray under the tongue every 5 (five) minutes x 3 doses as needed for chest pain.   Yes Historical Provider, MD  Omega-3 Fatty Acids (FISH OIL) 1000 MG CAPS Take 1,000 mg by mouth daily.   Yes Historical Provider, MD  spironolactone (ALDACTONE) 25 MG tablet Take 25 mg by mouth daily.   Yes Historical Provider, MD  Tamsulosin HCl (FLOMAX) 0.4 MG CAPS Take 0.4 mg by mouth daily. 12/28/11  Yes Historical Provider, MD  warfarin (COUMADIN) 2 MG tablet Take 2-4 mg by mouth daily. *takes 4mg  every day except for 2mg  on mondays and fridays*   Yes Historical Provider, MD    Family History  Family History  Problem Relation Age of Onset  . Stroke Neg Hx   . Colon  cancer Mother   . Hypertension Mother   . Cancer Mother   . Prostate cancer Brother   . Cancer Brother   . Heart disease Father   . Heart attack Father   . Hypertension Father   . Heart attack Sister   . Cancer Sister     Social History  Social History   Social History  . Marital Status: Divorced    Spouse Name: N/A  . Number of Children: 0  . Years of Education: master's   Occupational History  . semi-retired    Social History Main Topics  . Smoking status: Former Smoker    Quit date: 08/22/1983  . Smokeless tobacco: Never Used  . Alcohol Use: No  . Drug Use: No  . Sexual Activity: Not on file   Other Topics Concern  . Not on file  Social History Narrative   Patient drinks 1-2 cups of caffeine daily.   Patient is right handed.     Review of Systems, as per HPI, otherwise negative General:  No chills, fever, night sweats or weight changes.  Cardiovascular:  No chest pain, dyspnea on exertion, edema, orthopnea, palpitations, paroxysmal nocturnal dyspnea. Dermatological: No rash, lesions/masses Respiratory: No cough, dyspnea Urologic: No hematuria, dysuria Abdominal:   No nausea, vomiting, diarrhea, bright red blood per rectum, melena, or hematemesis Neurologic:  No visual changes, wkns, changes in mental status. All other systems reviewed and are otherwise negative except as noted above.  Physical Exam  Blood pressure 134/72, pulse 71, height 5\' 9"  (1.753 m), weight 214 lb (97.07 kg).  General: Pleasant, NAD Psych: Normal affect. Neuro: Alert and oriented X 3. Moves all extremities spontaneously. HEENT: Normal  Neck: Supple without bruits or JVD. Lungs:  Resp regular and unlabored, CTA. Heart: RRR no s3, s4, or murmurs. Abdomen: Soft, non-tender, non-distended, BS + x 4.  Extremities: No clubbing, cyanosis or edema. DP/PT/Radials 2+ and equal bilaterally.  Labs:  No results for input(s): CKTOTAL, CKMB, TROPONINI in the last 72 hours. Lab Results    Component Value Date   WBC 4.4 07/21/2015   HGB 13.5 07/21/2015   HCT 39.4 07/21/2015   MCV 81.2 07/21/2015   PLT 216 07/21/2015    No results found for: DDIMER Invalid input(s): POCBNP    Component Value Date/Time   NA 140 07/21/2015 1548   K 4.1 07/21/2015 1548   CL 104 07/21/2015 1548   CO2 28 07/21/2015 1548   GLUCOSE 111* 07/21/2015 1548   BUN 11 07/21/2015 1548   CREATININE 0.97 07/21/2015 1548   CREATININE 1.11 03/05/2015 0939   CALCIUM 8.4* 07/21/2015 1548   PROT 6.8 07/21/2015 1548   ALBUMIN 3.8 07/21/2015 1548   AST 19 07/21/2015 1548   ALT 18 07/21/2015 1548   ALKPHOS 78 07/21/2015 1548   BILITOT 1.3* 07/21/2015 1548   GFRNONAA >60 03/05/2015 0939   GFRAA >60 03/05/2015 0939   Lab Results  Component Value Date   CHOL 152 04/12/2011   HDL 55.40 04/12/2011   LDLCALC 75 04/12/2011   TRIG 106.0 04/12/2011    Accessory Clinical Findings  Echocardiogram - 03/18/14 Left ventricle: The cavity size was normal. Wall thickness was increased in a pattern of mild LVH. Systolic function was mildly to moderately reduced. The estimated ejection fraction was in the range of 40% to 45%. Diffuse hypokinesis. The transmitral flow pattern was normal. The deceleration time of the early transmitral flow velocity was normal. The pulmonary vein flow pattern was normal. The tissue Doppler parameters were normal. Left ventricular diastolic function parameters were normal.  Lexiscan nuclear stress test 03/20/14 IMPRESSION:  1. Inferior wall reduced activity on stress and rest images,  favoring scar. No inducible ischemia identified. Apical thinning.  2. Mild to moderate hypokinesis and poor wall thickening in the  septum and inferior wall. Left ventricular ejection fraction 46%.   Cath in 2006 03/14/2005: Cardiac Cath Findings: CONCLUSION: 1. Nonobstructive coronary artery disease with 40% narrowing of the  proximal left anterior descending artery, 40% narrowing in  the distal  circumflex artery, 0% stenosis at the stent site in the mid-right  coronary artery, and 40% narrowing in the proximal to mid-right coronary  artery. 2. Normal left ventricular function.  RECOMMENDATIONS: The patient has only nonobstructive coronary disease, and his left ventricular function has normalized since 2004. In retrospect, his previous cardiomyopathy may have  been a rate-related cardiomyopathy related to his atrial fibrillation, although I do not have documentation of the past rate. I will plan to continue medical therapy and continue workup of his fever.  Left cardiac cath: 07/30/2015 1. Mid RCA-1 lesion, 20% stenosed. The lesion was previously treated with a stent (bare metal). 2. Mid RCA-2 lesion, 60% stenosed. 3. Prox RCA lesion, 60% stenosed. 4. Mid Cx to Dist Cx lesion, 75% stenosed. 5. Ost LAD to [facility] LAD lesion, 25% stenosed.  The right coronary artery contains a patent stent in the midsegment. Proximal and distal to the stent or eccentric regions of 50-60% narrowing. These areas are unchanged compared to 2006.  Codominant left coronary system with luminal irregularities in LAD and distal circumflex 70-75% stenosis before the origin of a small branching obtuse marginal. The distal circumflex disease has progressed since 2006. The territory supplied beyond the stenosis is small  Dilated and globally hypocontractile left ventricle with ejection fraction 35%. Left ventricular end-diastolic pressure is normal. RECOMMENDATIONS:  The right coronary has not changed since the last angiogram. There is moderate progression in the distal circumflex. The circumflex could be causing angina. This territory is most appropriately treated with medical therapy given the need for triple drug therapy if we stent. The risk of bleeding would be greater than the benefit achieved by stenting in this relatively limited territory.    Assessment/Plan:  1.  CAD, cath in December 2016 showed 50-60% stenosis in the RCA, and 7075% stenosis in the distal circumflex artery. The patient remains symptomatic on moderate exertion we will add Imdur 50 mg daily and follow-up in 2 months.  2. Acute on chronic systolic CHF - now euvolemic after starting lasix 20 mg po daily.   3. 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 P.Afib -rate controlled, continue Coumadin, Pharmacy dosing  5. H/o alcoholic cardiomyopathy -stable  6. HTN -continue Avapro/aldactone, add carvedilol 6.25 mg po bid  Follow up in 2 months.   [name], MD, [facility] 09/28/2015, 3:58 PM

## 2015-09-28 NOTE — Patient Instructions (Signed)
**Note De-Identified  Obfuscation** Medication Instructions:  Start taking Imdur 15mg  daily. All other medications remain the same.  Labwork: None  Testing/Procedures: None  Follow-Up: Your physician recommends that you schedule a follow-up appointment in: 2 MONTHS   Any Other Special Instructions Will Be Listed Below (If Applicable).     If you need a refill on your cardiac medications before your next appointment, please call your pharmacy.

## 2015-10-08 ENCOUNTER — Ambulatory Visit (INDEPENDENT_AMBULATORY_CARE_PROVIDER_SITE_OTHER): Payer: Medicare Other | Admitting: *Deleted

## 2015-10-08 DIAGNOSIS — I482 Chronic atrial fibrillation, unspecified: Secondary | ICD-10-CM

## 2015-10-08 DIAGNOSIS — I6350 Cerebral infarction due to unspecified occlusion or stenosis of unspecified cerebral artery: Secondary | ICD-10-CM

## 2015-10-08 DIAGNOSIS — Z5181 Encounter for therapeutic drug level monitoring: Secondary | ICD-10-CM

## 2015-10-08 DIAGNOSIS — Z7901 Long term (current) use of anticoagulants: Secondary | ICD-10-CM | POA: Diagnosis not present

## 2015-10-08 DIAGNOSIS — I4891 Unspecified atrial fibrillation: Secondary | ICD-10-CM

## 2015-10-08 LAB — POCT INR: INR: 2.3

## 2015-10-23 ENCOUNTER — Other Ambulatory Visit: Payer: Self-pay | Admitting: *Deleted

## 2015-10-23 MED ORDER — CARVEDILOL 6.25 MG PO TABS: 6.2500 mg | ORAL_TABLET | Freq: Two times a day (BID) | ORAL | Status: DC

## 2015-10-27 DIAGNOSIS — H401134 Primary open-angle glaucoma, bilateral, indeterminate stage: Secondary | ICD-10-CM | POA: Diagnosis not present

## 2015-10-27 DIAGNOSIS — H53453 Other localized visual field defect, bilateral: Secondary | ICD-10-CM | POA: Diagnosis not present

## 2015-10-27 DIAGNOSIS — I639 Cerebral infarction, unspecified: Secondary | ICD-10-CM | POA: Diagnosis not present

## 2015-10-27 DIAGNOSIS — H2513 Age-related nuclear cataract, bilateral: Secondary | ICD-10-CM | POA: Diagnosis not present

## 2015-11-05 ENCOUNTER — Encounter: Payer: Self-pay | Admitting: Gastroenterology

## 2015-11-24 DIAGNOSIS — H401132 Primary open-angle glaucoma, bilateral, moderate stage: Secondary | ICD-10-CM | POA: Diagnosis not present

## 2015-11-24 DIAGNOSIS — H2513 Age-related nuclear cataract, bilateral: Secondary | ICD-10-CM | POA: Diagnosis not present

## 2015-11-24 DIAGNOSIS — H43813 Vitreous degeneration, bilateral: Secondary | ICD-10-CM | POA: Diagnosis not present

## 2015-11-25 ENCOUNTER — Ambulatory Visit (INDEPENDENT_AMBULATORY_CARE_PROVIDER_SITE_OTHER): Payer: Medicare Other | Admitting: Pharmacist

## 2015-11-25 ENCOUNTER — Ambulatory Visit (INDEPENDENT_AMBULATORY_CARE_PROVIDER_SITE_OTHER): Payer: Medicare Other | Admitting: Cardiology

## 2015-11-25 ENCOUNTER — Encounter: Payer: Self-pay | Admitting: Cardiology

## 2015-11-25 VITALS — BP 130/82 | HR 68 | Ht 69.0 in | Wt 212.8 lb

## 2015-11-25 DIAGNOSIS — I482 Chronic atrial fibrillation, unspecified: Secondary | ICD-10-CM

## 2015-11-25 DIAGNOSIS — I4891 Unspecified atrial fibrillation: Secondary | ICD-10-CM | POA: Diagnosis not present

## 2015-11-25 DIAGNOSIS — Z5181 Encounter for therapeutic drug level monitoring: Secondary | ICD-10-CM | POA: Diagnosis not present

## 2015-11-25 DIAGNOSIS — Z7901 Long term (current) use of anticoagulants: Secondary | ICD-10-CM | POA: Diagnosis not present

## 2015-11-25 DIAGNOSIS — I426 Alcoholic cardiomyopathy: Secondary | ICD-10-CM

## 2015-11-25 DIAGNOSIS — I480 Paroxysmal atrial fibrillation: Secondary | ICD-10-CM | POA: Diagnosis not present

## 2015-11-25 DIAGNOSIS — I209 Angina pectoris, unspecified: Secondary | ICD-10-CM

## 2015-11-25 DIAGNOSIS — I25118 Atherosclerotic heart disease of native coronary artery with other forms of angina pectoris: Secondary | ICD-10-CM

## 2015-11-25 DIAGNOSIS — I10 Essential (primary) hypertension: Secondary | ICD-10-CM

## 2015-11-25 DIAGNOSIS — I6350 Cerebral infarction due to unspecified occlusion or stenosis of unspecified cerebral artery: Secondary | ICD-10-CM | POA: Diagnosis not present

## 2015-11-25 DIAGNOSIS — R079 Chest pain, unspecified: Secondary | ICD-10-CM | POA: Insufficient documentation

## 2015-11-25 LAB — POCT INR: INR: 1.7

## 2015-11-25 NOTE — Patient Instructions (Signed)
Your physician recommends that you continue on your current medications as directed. Please refer to the Current Medication list given to you today. Your physician wants you to follow-up in: 3 months with Dr. Meda Coffee.   You will receive a reminder letter in the mail two months in advance. If you don't receive a letter, please call our office to schedule the follow-up appointment.

## 2015-11-25 NOTE — Progress Notes (Signed)
Patient ID: TOMIO LAMIA, male   DOB: [birth year], 80 y.o.   MRN: [id number]    Patient Name: Vincent [name] Date of Encounter: 11/25/2015  Primary Care Provider:  [name], MD Primary Cardiologist:  [name]  Problem List   Past Medical History  Diagnosis Date  . Hypertension   . Stroke ([place])   . Syncope and collapse   . Hyperlipidemia   . Coronary artery disease   . Atrial fibrillation ([place])   . V-tach ([place])   . Bradycardia   . Alcoholic cardiomyopathy ([place])   . Pulmonary eosinophilia ([place])   . Unspecified glaucoma   . GERD (gastroesophageal reflux disease)   . Long term (current) use of anticoagulants   . Gout   . Foot drop, left 03/11/2015  . Gait disorder 03/11/2015  . 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 Hemiparesis and alteration of sensations as late effects of stroke ([place]) 09/25/2015   Past Surgical History  Procedure Laterality Date  . Loop recorder  09/2007  . Knee surgery    . Cardiac catheterization N/A 07/30/2015    Procedure: Left Heart Cath and Coronary Angiography;  Surgeon: Belva Crome, MD;  Location: [facility];  Service: Cardiovascular;  Laterality: N/A;    Allergies  No Known Allergies  HPI  Mr Elliston returns today for evaluation and management of his complex cardiovascular history, ischemic and nonischemic cardiomyopathy with prior PCI and previously depressed but now normal left ventricular function by echo in 2009.  He is also seen in the devise clinic for implantation of a loop recorder in February 2009 due to spells of dizziness described as presyncopal versus partial seizures. He has had recurrent episodes while his ILR was functioning and there were no arrhythmias documented correlating to his symptoms. His ILR was deemed non-functioning back in April 2013 due to battery depletion as it was implanted in 2009.   The patient was admitted in July  2015 for dizziness/near-syncope, etiology unclear.  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 He was not able to get ENT referral yet.  -  2D ECHO done 7/21 = EF 40-45% with diffuse hypokinesis. Nuclear stress test showed an old MI but no ischemia.  He reports compliance with his medications. No CP or SOB. His sister (who is also my patient) states that they recently lost their sister, father and a niece and that the patient seems depressed. He denies and doesn't want to be treated for it.  07/21/2015 - the patient is coming after 1 year, he has been compliant with his meds, but has been experiencing worsening chest pain on exertion like walking uphill. His stress test in 02/2014 showed inferior scar with mild inferolateral ischemia, but he was minimally symptomatic at that time. He has also noticed mild LE edema, no orthopnea, PND, no palpitations or syncope.   09/28/2015 - the patient was referred for left cardiac catheterization that he underwent on 07/30/2015 and was found to have progression of the distal left circumflex artery currently 70-75% stenosis. Dr. [name] felt that because this is a distal vessel that supplies small portion of the myocardium and if his stents he would require anticoagulation and dual antiplatelet therapy that would put him at high risk of bleeding. The patient comes today he states that he is having on  ongoing chest pain with moderate exertion like carrying heavy things or shoveling snow. He denies any palpitations or syncope no bleeding no lower extremity edema orthopnea or proximal nocturnal dyspnea.  11/25/2015 - patient is coming after 2 months he states that he feels quite limited with activities as walking uphill just few steps using chest pain. Its a better when he walks on a flat surface like to know more but limitation is quite significant. He has  been compliant to his medications and has no side effects. He denies any orthopnea, palpitations, syncope he has stable chronic mild lower extremity edema. He has been using Imdur daily but was not sure how to use sublingual nitroglycerin spray.   Home Medications  Prior to Admission medications   Medication Sig Start Date End Date Taking? Authorizing Provider  aspirin 81 MG chewable tablet Chew 81 mg by mouth daily.   Yes Historical Provider, MD  atorvastatin (LIPITOR) 40 MG tablet Take 40 mg by mouth daily at 6 PM.   Yes Historical Provider, MD  bimatoprost (LUMIGAN) 0.03 % ophthalmic solution Place 1 drop into both eyes at bedtime.    Yes Historical Provider, MD  brimonidine (ALPHAGAN P) 0.1 % SOLN Place 1 drop into the right eye 2 (two) times daily.    Yes Historical Provider, MD  colchicine 0.6 MG tablet Take 0.6 mg by mouth daily.    Yes Historical Provider, MD  famotidine (PEPCID) 20 MG tablet Take 20 mg by mouth 2 (two) times daily.   Yes Historical Provider, MD  ferrous sulfate 325 (65 FE) MG tablet Take 325 mg by mouth daily with breakfast.   Yes Historical Provider, MD  hydrocortisone (ANUSOL-HC) 2.5 % rectal cream Place 1 application rectally 2 (two) times daily as needed for hemorrhoids or itching.   Yes Historical Provider, MD  hydrocortisone cream 1 % Apply 1 application topically 2 (two) times daily as needed (for eczmea).   Yes Historical Provider, MD  irbesartan (AVAPRO) 300 MG tablet Take 300 mg by mouth daily.   Yes Historical Provider, MD  loratadine (CLARITIN) 10 MG tablet Take 10 mg by mouth daily.     Yes Historical Provider, MD  meclizine (ANTIVERT) 25 MG tablet Take 25 mg by mouth 3 (three) times daily as needed for dizziness.   Yes Historical Provider, MD  nitroGLYCERIN (NITROLINGUAL) 0.4 MG/SPRAY spray Place 1 spray under the tongue every 5 (five) minutes x 3 doses as needed for chest pain.   Yes Historical Provider, MD  Omega-3 Fatty Acids (FISH OIL) 1000 MG CAPS Take  1,000 mg by mouth daily.   Yes Historical Provider, MD  spironolactone (ALDACTONE) 25 MG tablet Take 25 mg by mouth daily.   Yes Historical Provider, MD  Tamsulosin HCl (FLOMAX) 0.4 MG CAPS Take 0.4 mg by mouth daily. 12/28/11  Yes Historical Provider, MD  warfarin (COUMADIN) 2 MG tablet Take 2-4 mg by mouth daily. *takes 4mg  every day except for 2mg  on mondays and fridays*   Yes Historical Provider, MD    Family History  Family History  Problem Relation Age of Onset  . Stroke Neg Hx   . Colon cancer Mother   . Hypertension Mother   . Cancer Mother   . Prostate cancer Brother   . Cancer Brother   . Heart disease Father   . Heart attack Father   . Hypertension Father   . Heart attack Sister   . Cancer Sister     Social History  Social History  Social History  . Marital Status: Divorced    Spouse Name: N/A  . Number of Children: 0  . Years of Education: master's   Occupational History  . semi-retired    Social History Main Topics  . Smoking status: Former Smoker    Quit date: 08/22/1983  . Smokeless tobacco: Never Used  . Alcohol Use: No  . Drug Use: No  . Sexual Activity: Not on file   Other Topics Concern  . Not on file   Social History Narrative   Patient drinks 1-2 cups of caffeine daily.   Patient is right handed.     Review of Systems, as per HPI, otherwise negative General:  No chills, fever, night sweats or weight changes.  Cardiovascular:  No chest pain, dyspnea on exertion, edema, orthopnea, palpitations, paroxysmal nocturnal dyspnea. Dermatological: No rash, lesions/masses Respiratory: No cough, dyspnea Urologic: No hematuria, dysuria Abdominal:   No nausea, vomiting, diarrhea, bright red blood per rectum, melena, or hematemesis Neurologic:  No visual changes, wkns, changes in mental status. All other systems reviewed and are otherwise negative except as noted above.  Physical Exam  Blood pressure 130/82, pulse 68, height 5\' 9"  (1.753 m), weight  212 lb 12.8 oz (96.525 kg).  General: Pleasant, NAD Psych: Normal affect. Neuro: Alert and oriented X 3. Moves all extremities spontaneously. HEENT: Normal  Neck: Supple without bruits or JVD. Lungs:  Resp regular and unlabored, CTA. Heart: RRR no s3, s4, or murmurs. Abdomen: Soft, non-tender, non-distended, BS + x 4.  Extremities: No clubbing, cyanosis or edema. DP/PT/Radials 2+ and equal bilaterally.  Labs:  No results for input(s): CKTOTAL, CKMB, TROPONINI in the last 72 hours. Lab Results  Component Value Date   WBC 4.4 07/21/2015   HGB 13.5 07/21/2015   HCT 39.4 07/21/2015   MCV 81.2 07/21/2015   PLT 216 07/21/2015    No results found for: DDIMER Invalid input(s): POCBNP    Component Value Date/Time   NA 140 07/21/2015 1548   K 4.1 07/21/2015 1548   CL 104 07/21/2015 1548   CO2 28 07/21/2015 1548   GLUCOSE 111* 07/21/2015 1548   BUN 11 07/21/2015 1548   CREATININE 0.97 07/21/2015 1548   CREATININE 1.11 03/05/2015 0939   CALCIUM 8.4* 07/21/2015 1548   PROT 6.8 07/21/2015 1548   ALBUMIN 3.8 07/21/2015 1548   AST 19 07/21/2015 1548   ALT 18 07/21/2015 1548   ALKPHOS 78 07/21/2015 1548   BILITOT 1.3* 07/21/2015 1548   GFRNONAA >60 03/05/2015 0939   GFRAA >60 03/05/2015 0939   Lab Results  Component Value Date   CHOL 152 04/12/2011   HDL 55.40 04/12/2011   LDLCALC 75 04/12/2011   TRIG 106.0 04/12/2011    Accessory Clinical Findings  Echocardiogram - 03/18/14 Left ventricle: The cavity size was normal. Wall thickness was increased in a pattern of mild LVH. Systolic function was mildly to moderately reduced. The estimated ejection fraction was in the range of 40% to 45%. Diffuse hypokinesis. The transmitral flow pattern was normal. The deceleration time of the early transmitral flow velocity was normal. The pulmonary vein flow pattern was normal. The tissue Doppler parameters were normal. Left ventricular diastolic function parameters were  normal.  Lexiscan nuclear stress test 03/20/14 IMPRESSION:  1. Inferior wall reduced activity on stress and rest images,  favoring scar. No inducible ischemia identified. Apical thinning.  2. Mild to moderate hypokinesis and poor wall thickening in the  septum and inferior wall. Left ventricular ejection fraction 46%.  Cath in 2006 03/14/2005: Cardiac Cath Findings: CONCLUSION: 1. Nonobstructive coronary artery disease with 40% narrowing of the  proximal left anterior descending artery, 40% narrowing in the distal  circumflex artery, 0% stenosis at the stent site in the mid-right  coronary artery, and 40% narrowing in the proximal to mid-right coronary  artery. 2. Normal left ventricular function.  RECOMMENDATIONS: The patient has only nonobstructive coronary disease, and his left ventricular function has normalized since 2004. In retrospect, his previous cardiomyopathy may have been a rate-related cardiomyopathy related to his atrial fibrillation, although I do not have documentation of the past rate. I will plan to continue medical therapy and continue workup of his fever.  Left cardiac cath: 07/30/2015 1. Mid RCA-1 lesion, 20% stenosed. The lesion was previously treated with a stent (bare metal). 2. Mid RCA-2 lesion, 60% stenosed. 3. Prox RCA lesion, 60% stenosed. 4. Mid Cx to Dist Cx lesion, 75% stenosed. 5. Ost LAD to [facility] LAD lesion, 25% stenosed.  The right coronary artery contains a patent stent in the midsegment. Proximal and distal to the stent or eccentric regions of 50-60% narrowing. These areas are unchanged compared to 2006.  Codominant left coronary system with luminal irregularities in LAD and distal circumflex 70-75% stenosis before the origin of a small branching obtuse marginal. The distal circumflex disease has progressed since 2006. The territory supplied beyond the stenosis is small  Dilated and globally hypocontractile left  ventricle with ejection fraction 35%. Left ventricular end-diastolic pressure is normal. RECOMMENDATIONS:  The right coronary has not changed since the last angiogram. There is moderate progression in the distal circumflex. The circumflex could be causing angina. This territory is most appropriately treated with medical therapy given the need for triple drug therapy if we stent. The risk of bleeding would be greater than the benefit achieved by stenting in this relatively limited territory.    Assessment/Plan:  1. CAD, cath in December 2016 showed 50-60% stenosis in the RCA, and 70-75% stenosis in the distal circumflex artery. The patient remains symptomatic despite adding Imdur. He is advised to exercise on a daily basis. Patient is very anxious as 2 of his sisters die secondary to heart attack. I will see him in 2 months and if his symptoms remain the same I will rediscuss with Dr. Thompson Caul potential stenting of her circumflex artery.  2. Acute on chronic systolic CHF - now euvolemic after starting lasix 20 mg po daily. He is advised to take an extra Lasix on days when his lower extremity edema is worse. -  2D ECHO done 03/18/14 = EF 40-45% with diffuse hypokinesis  3. Dizziness/Near Syncope -sec to Meniere's disease - follows with ENT  4. P.Afib -rate controlled, continue Coumadin, Pharmacy dosing  5. H/o alcoholic cardiomyopathy -stable  6. HTN Controlled.  Follow up in 2 months.   [name], MD, [facility] 11/25/2015, 1:58 PM

## 2015-12-07 DIAGNOSIS — H2513 Age-related nuclear cataract, bilateral: Secondary | ICD-10-CM | POA: Diagnosis not present

## 2015-12-07 DIAGNOSIS — H25811 Combined forms of age-related cataract, right eye: Secondary | ICD-10-CM | POA: Diagnosis not present

## 2015-12-07 DIAGNOSIS — H2511 Age-related nuclear cataract, right eye: Secondary | ICD-10-CM | POA: Diagnosis not present

## 2015-12-10 ENCOUNTER — Ambulatory Visit (HOSPITAL_COMMUNITY)
Admission: EM | Admit: 2015-12-10 | Discharge: 2015-12-10 | Disposition: A | Discharge status: Home / Self Care | Payer: Medicare Other | Attending: Family Medicine | Admitting: Family Medicine

## 2015-12-10 ENCOUNTER — Encounter (HOSPITAL_COMMUNITY): Payer: Self-pay | Admitting: Emergency Medicine

## 2015-12-10 DIAGNOSIS — J302 Other seasonal allergic rhinitis: Secondary | ICD-10-CM

## 2015-12-10 MED ORDER — IPRATROPIUM BROMIDE 0.03 % NA SOLN: 2.0000 | Freq: Two times a day (BID) | NASAL | Status: DC

## 2015-12-10 NOTE — ED Provider Notes (Signed)
CSN: [id number]     Arrival date & time 12/10/15  1723 History   First MD Initiated Contact with Patient 12/10/15 1853     Chief Complaint  Patient presents with  . URI   (Consider location/radiation/quality/duration/timing/severity/associated sxs/prior Treatment) HPI Comments: 80 year old male with a history of seasonal allergies has developed sneezing this morning as well as a nasal stuffiness and runny nose. He has not taken any medications.  Review of systems: No fever, chills, shortness of breath, cough, neck pain, chest pain or abdominal pain.  Patient is a 79 y.o. male presenting with URI.  URI   Past Medical History  Diagnosis Date  . Hypertension   . Stroke ([place])   . Syncope and collapse   . Hyperlipidemia   . Coronary artery disease   . Atrial fibrillation ([place])   . V-tach ([place])   . Bradycardia   . Alcoholic cardiomyopathy ([place])   . Pulmonary eosinophilia ([facility])   . Unspecified glaucoma   . GERD (gastroesophageal reflux disease)   . Long term (current) use of anticoagulants   . Gout   . Foot drop, left 03/11/2015  . Gait disorder 03/11/2015  . 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 Hemiparesis and alteration of sensations as late effects of stroke ([place]) 09/25/2015   Past Surgical History  Procedure Laterality Date  . Loop recorder  09/2007  . Knee surgery    . Cardiac catheterization N/A 07/30/2015    Procedure: Left Heart Cath and Coronary Angiography;  Surgeon: Belva Crome, MD;  Location: [facility];  Service: Cardiovascular;  Laterality: N/A;   Family History  Problem Relation Age of Onset  . Stroke Neg Hx   . Colon cancer Mother   . Hypertension Mother   . Cancer Mother   . Prostate cancer Brother   . Cancer Brother   . Heart disease Father   . Heart attack Father   . Hypertension Father   . Heart attack Sister   . Cancer Sister    Social History  Substance Use Topics   . Smoking status: Former Smoker    Quit date: 08/22/1983  . Smokeless tobacco: Never Used  . Alcohol Use: No    Review of Systems  All other systems reviewed and are negative.   Allergies  Review of patient's allergies indicates no known allergies.  Home Medications   Prior to Admission medications   Medication Sig Start Date End Date Taking? Authorizing Provider  aspirin 81 MG chewable tablet Chew 81 mg by mouth daily.    Historical Provider, MD  atorvastatin (LIPITOR) 40 MG tablet Take 1 tablet (40 mg total) by mouth daily. 09/28/15   [name], MD  brimonidine (ALPHAGAN P) 0.1 % SOLN Place 1 drop into both eyes 2 (two) times daily.     Historical Provider, MD  carvedilol (COREG) 6.25 MG tablet Take 1 tablet (6.25 mg total) by mouth 2 (two) times daily. 10/23/15   [name], MD  colchicine 0.6 MG tablet Take 0.6 mg by mouth daily.     Historical Provider, MD  famotidine (PEPCID) 20 MG tablet Take 20 mg by mouth 2 (two) times daily.    Historical Provider, MD  ferrous sulfate 325 (65 FE) MG tablet Take 325 mg by mouth daily with breakfast.    Historical Provider, MD  fluocinonide cream (LIDEX) [id number] % Apply 1 application topically daily as needed (  itching).  01/13/15   Historical Provider, MD  furosemide (LASIX) 20 MG tablet Take 1 tablet (20 mg total) by mouth daily. 07/21/15   [name], MD  gabapentin (NEURONTIN) 300 MG capsule Take 300 mg by mouth 2 (two) times daily.    Historical Provider, MD  hydrocortisone (ANUSOL-HC) 2.5 % rectal cream Place 1 application rectally 2 (two) times daily as needed for hemorrhoids or itching.    Historical Provider, MD  hydrocortisone cream 1 % Apply 1 application topically 2 (two) times daily as needed (for eczmea).    Historical Provider, MD  ipratropium (ATROVENT) 0.03 % nasal spray Place 2 sprays into both nostrils every 12 (twelve) hours. 12/10/15   Janne Napoleon, NP  irbesartan (AVAPRO) 300 MG tablet Take 1 tablet (300 mg  total) by mouth at bedtime. 07/21/15   [name], MD  isosorbide mononitrate (IMDUR) 30 MG 24 hr tablet Take 0.5 tablets (15 mg total) by mouth daily. 09/28/15   [name], MD  latanoprost (XALATAN) 0.005 % ophthalmic solution Place 1 drop into both eyes at bedtime.    Historical Provider, MD  meclizine (ANTIVERT) 25 MG tablet Take 25 mg by mouth 3 (three) times daily as needed for dizziness.    Historical Provider, MD  nitroGLYCERIN (NITROLINGUAL) 0.4 MG/SPRAY spray Place 1 spray under the tongue every 5 (five) minutes x 3 doses as needed for chest pain.    Historical Provider, MD  Omega-3 Fatty Acids (FISH OIL) 1000 MG CAPS Take 1,000 mg by mouth 2 (two) times daily.     Historical Provider, MD  pseudoephedrine (SUDAFED) 60 MG tablet Take 60 mg by mouth every 4 (four) hours as needed for congestion.    Historical Provider, MD  spironolactone (ALDACTONE) 25 MG tablet Take 25 mg by mouth daily.    Historical Provider, MD  Tamsulosin HCl (FLOMAX) 0.4 MG CAPS Take 0.4 mg by mouth daily. 12/28/11   Historical Provider, MD  triamcinolone cream (KENALOG) 0.1 % Apply 1 application topically 2 (two) times daily. Eczema 01/20/15   Historical Provider, MD  warfarin (COUMADIN) 2 MG tablet TAKE AS DIRECTED BY COUMADIN CLINIC 06/08/15   [name], MD   Meds Ordered and Administered this Visit  Medications - No data to display  BP 135/66 mmHg  Pulse 67  Temp(Src) 98.8 F (37.1 C) (Oral)  Resp 16  SpO2 100% No data found.   Physical Exam  Constitutional: He is oriented to person, place, and time. He appears well-developed and well-nourished. No distress.  HENT:  Head: Normocephalic and atraumatic.  Right Ear: External ear normal.  Left Ear: External ear normal.  Mouth/Throat: Oropharynx is clear and moist. No oropharyngeal exudate.  Small amount of clear PND otherwise normal  Eyes: Conjunctivae and EOM are normal.  Neck: Normal range of motion. Neck supple.  Cardiovascular:  Normal rate, regular rhythm and normal heart sounds.   Pulmonary/Chest: Effort normal and breath sounds normal. No respiratory distress. He has no wheezes. He has no rales.  Musculoskeletal: He exhibits no edema.  Lymphadenopathy:    He has no cervical adenopathy.  Neurological: He is alert and oriented to person, place, and time. He exhibits normal muscle tone.  Skin: Skin is warm and dry. No rash noted. He is not diaphoretic.  Psychiatric: He has a normal mood and affect.  Nursing note and vitals reviewed.   ED Course  Procedures (including critical care time)  Labs Review Labs Reviewed - No data to display  Imaging Review No results found.   Visual Acuity Review  Right Eye Distance:   Left Eye Distance:   Bilateral Distance:    Right Eye Near:   Left Eye Near:    Bilateral Near:         MDM   1. Other seasonal allergic rhinitis    Sneezing and drainage recommend taking either Zyrtec, Claritin or Allegra. Due to your heart conditions do not take decongestants. You may use Neo-Synephrine or Afrin nasal spray for 3 days if needed for nasal stuffiness. Use the Atrovent nasal spray as directed Saline nasal spray frequently. Meds ordered this encounter  Medications  . ipratropium (ATROVENT) 0.03 % nasal spray    Sig: Place 2 sprays into both nostrils every 12 (twelve) hours.    Dispense:  30 mL    Refill:  12    Order Specific Question:  Supervising Provider    Answer:  Billy Fischer [[id number]]       Janne Napoleon, NP 12/10/15 1912

## 2015-12-10 NOTE — ED Notes (Signed)
Complains of sneezing, head congestion.  Denies cough, denies fever.  Patient had cataract surgery this past Monday.

## 2015-12-10 NOTE — Discharge Instructions (Signed)
Allergic Rhinitis Sneezing and drainage recommend taking either Zyrtec, Claritin or Allegra. Due to your heart conditions do not take decongestants. You may use Neo-Synephrine or Afrin nasal spray for 3 days if needed for nasal stuffiness. Use the Atrovent nasal spray as directed Saline nasal spray frequently. Allergic rhinitis is when the mucous membranes in the nose respond to allergens. Allergens are particles in the air that cause your body to have an allergic reaction. This causes you to release allergic antibodies. Through a chain of events, these eventually cause you to release histamine into the blood stream. Although meant to protect the body, it is this release of histamine that causes your discomfort, such as frequent sneezing, congestion, and an itchy, runny nose.  CAUSES Seasonal allergic rhinitis (hay fever) is caused by pollen allergens that may come from grasses, trees, and weeds. Year-round allergic rhinitis (perennial allergic rhinitis) is caused by allergens such as house dust mites, pet dander, and mold spores. SYMPTOMS  Nasal stuffiness (congestion).  Itchy, runny nose with sneezing and tearing of the eyes. DIAGNOSIS Your health care provider can help you determine the allergen or allergens that trigger your symptoms. If you and your health care provider are unable to determine the allergen, skin or blood testing may be used. Your health care provider will diagnose your condition after taking your health history and performing a physical exam. Your health care provider may assess you for other related conditions, such as asthma, pink eye, or an ear infection. TREATMENT Allergic rhinitis does not have a cure, but it can be controlled by:  Medicines that block allergy symptoms. These may include allergy shots, nasal sprays, and oral antihistamines.  Avoiding the allergen. Hay fever may often be treated with antihistamines in pill or nasal spray forms. Antihistamines block the  effects of histamine. There are over-the-counter medicines that may help with nasal congestion and swelling around the eyes. Check with your health care provider before taking or giving this medicine. If avoiding the allergen or the medicine prescribed do not work, there are many new medicines your health care provider can prescribe. Stronger medicine may be used if initial measures are ineffective. Desensitizing injections can be used if medicine and avoidance does not work. Desensitization is when a patient is given ongoing shots until the body becomes less sensitive to the allergen. Make sure you follow up with your health care provider if problems continue. HOME CARE INSTRUCTIONS It is not possible to completely avoid allergens, but you can reduce your symptoms by taking steps to limit your exposure to them. It helps to know exactly what you are allergic to so that you can avoid your specific triggers. SEEK MEDICAL CARE IF:  You have a fever.  You develop a cough that does not stop easily (persistent).  You have shortness of breath.  You start wheezing.  Symptoms interfere with normal daily activities.   This information is not intended to replace advice given to you by your health care provider. Make sure you discuss any questions you have with your health care provider.   Document Released: 05/10/2001 Document Revised: 09/05/2014 Document Reviewed: 04/22/2013 Elsevier Interactive Patient Education [employer].

## 2015-12-23 ENCOUNTER — Ambulatory Visit (INDEPENDENT_AMBULATORY_CARE_PROVIDER_SITE_OTHER): Payer: Medicare Other | Admitting: *Deleted

## 2015-12-23 DIAGNOSIS — Z7901 Long term (current) use of anticoagulants: Secondary | ICD-10-CM

## 2015-12-23 DIAGNOSIS — I6350 Cerebral infarction due to unspecified occlusion or stenosis of unspecified cerebral artery: Secondary | ICD-10-CM | POA: Diagnosis not present

## 2015-12-23 DIAGNOSIS — I482 Chronic atrial fibrillation, unspecified: Secondary | ICD-10-CM

## 2015-12-23 DIAGNOSIS — Z5181 Encounter for therapeutic drug level monitoring: Secondary | ICD-10-CM

## 2015-12-23 DIAGNOSIS — I4891 Unspecified atrial fibrillation: Secondary | ICD-10-CM | POA: Diagnosis not present

## 2015-12-23 LAB — POCT INR: INR: 3.4

## 2015-12-29 ENCOUNTER — Other Ambulatory Visit: Payer: Self-pay | Admitting: Cardiology

## 2016-01-06 ENCOUNTER — Ambulatory Visit (INDEPENDENT_AMBULATORY_CARE_PROVIDER_SITE_OTHER): Payer: Medicare Other | Admitting: *Deleted

## 2016-01-06 DIAGNOSIS — I4891 Unspecified atrial fibrillation: Secondary | ICD-10-CM

## 2016-01-06 DIAGNOSIS — Z5181 Encounter for therapeutic drug level monitoring: Secondary | ICD-10-CM

## 2016-01-06 DIAGNOSIS — I482 Chronic atrial fibrillation, unspecified: Secondary | ICD-10-CM

## 2016-01-06 DIAGNOSIS — Z7901 Long term (current) use of anticoagulants: Secondary | ICD-10-CM | POA: Diagnosis not present

## 2016-01-06 DIAGNOSIS — I6350 Cerebral infarction due to unspecified occlusion or stenosis of unspecified cerebral artery: Secondary | ICD-10-CM | POA: Diagnosis not present

## 2016-01-06 LAB — POCT INR: INR: 2.7

## 2016-01-18 ENCOUNTER — Ambulatory Visit (INDEPENDENT_AMBULATORY_CARE_PROVIDER_SITE_OTHER): Payer: Medicare Other | Admitting: Cardiology

## 2016-01-18 ENCOUNTER — Encounter: Payer: Self-pay | Admitting: *Deleted

## 2016-01-18 ENCOUNTER — Ambulatory Visit (INDEPENDENT_AMBULATORY_CARE_PROVIDER_SITE_OTHER): Payer: Medicare Other | Admitting: Pharmacist

## 2016-01-18 ENCOUNTER — Encounter: Payer: Self-pay | Admitting: Cardiology

## 2016-01-18 VITALS — BP 130/80 | HR 60 | Ht 69.0 in | Wt 214.0 lb

## 2016-01-18 DIAGNOSIS — I6350 Cerebral infarction due to unspecified occlusion or stenosis of unspecified cerebral artery: Secondary | ICD-10-CM | POA: Diagnosis not present

## 2016-01-18 DIAGNOSIS — I482 Chronic atrial fibrillation, unspecified: Secondary | ICD-10-CM

## 2016-01-18 DIAGNOSIS — I208 Other forms of angina pectoris: Secondary | ICD-10-CM

## 2016-01-18 DIAGNOSIS — I4891 Unspecified atrial fibrillation: Secondary | ICD-10-CM

## 2016-01-18 DIAGNOSIS — Z7901 Long term (current) use of anticoagulants: Secondary | ICD-10-CM | POA: Diagnosis not present

## 2016-01-18 DIAGNOSIS — Z5181 Encounter for therapeutic drug level monitoring: Secondary | ICD-10-CM | POA: Diagnosis not present

## 2016-01-18 LAB — POCT INR: INR: 3

## 2016-01-18 MED ORDER — ISOSORBIDE MONONITRATE ER 30 MG PO TB24: 30.0000 mg | ORAL_TABLET | Freq: Every day | ORAL | Status: DC

## 2016-01-18 NOTE — Patient Instructions (Addendum)
Medication Instructions:  Your physician has recommended you make the following change in your medication:  1.  INCREASE the Imdur to 30 mg taking 1 whole tablet daily   Labwork: None ordered  Testing/Procedures: None ordered  Follow-Up: Your physician recommends that you schedule a follow-up appointment in: 2-3 [place]   Any Other Special Instructions Will Be Listed Below (If Applicable).  You are cleared for your colonoscopy.  Once you get it scheduled, you will need to check with our Coumadin Clinic to see if you will need to have the [place] before stopping the Coumadin.   If you need a refill on your cardiac medications before your next appointment, please call your pharmacy.

## 2016-01-18 NOTE — Progress Notes (Signed)
01/18/2016 Vincent [name]   February 08, 1936  AI:[id number]  Primary Physician [name], MD Primary Cardiologist: Dr. Meda Coffee   Reason for Visit/CC: F/u for CAD  HPI:  80 y/o male, followed by Dr. Meda Coffee, who presents to clinic for f/u. He has h/o CAD, chronic systolic HF/ alcoholic cardiomyopathy, PAF that is rate conrolled on chronic coumadin as well as h/o HTN and prior h/o CVA. In 06/2015 he underwent a NST for CP evaluation. This was an abnormal study. Subsequent LHC per Dr. [name] 12/16 demonstrated patent previously placed mid RCA stent with 50-60% narrowing distal to the stent, unchanged from prior cath in 2006. There was also a codominant left coronary system with luminal irregularities in LAD and distal circumflex 70-75% stenosis before the origin of a small branching obtuse marginal. The distal circumflex disease has progressed since 2006. The territory supplied beyond the stenosis is small. EF was 35%. Dr. [name] felt that the circumflex would be best managed medically, given the territory supplied by this vessel is small and stenting would have required triple therapy and risk of bleeding would be greater than the benefit achieved by stenting this relatively limited territory. He was treated medically.    He was seen by Dr. Meda Coffee 10/2015. At that time, he seemed very concern regarding medical management of his circumflex lesion.  He was very anxious given 2 of his sisters died secondary to heart attacks. Imdur was added. Dr. Meda Coffee instructed him to f/u in 2 months to reassess. If still symptomatic, she would reconsider PCI of the circumflex.   He presents back for f/u. He continues to have exertional CP with moderate physical activity. This occurs ~2-3 times a week and resolves with rest. It is improved some with addition of Imdur. He is only taking a low dose, 15 mg daily. He is tolerating well w/o HA or other side effects. BP is stable. No resting chest pain and no dyspnea.   He also  notes that he is overdue for a routine colonoscopy that he gets every 5 years (h/o benign polyps). He reports full compliance with meds. No abnormal bleeding with Coumadin. His INR was checked today in our coumadin clinic. Dosing instructions were given.   Current Outpatient Prescriptions  Medication Sig Dispense Refill  . aspirin 81 MG chewable tablet Chew 81 mg by mouth daily.    Marland Kitchen atorvastatin (LIPITOR) 40 MG tablet Take 1 tablet (40 mg total) by mouth daily. 90 tablet 0  . brimonidine (ALPHAGAN P) 0.1 % SOLN Place 1 drop into both eyes 2 (two) times daily.     . carvedilol (COREG) 6.25 MG tablet Take 1 tablet (6.25 mg total) by mouth 2 (two) times daily. 60 tablet 10  . colchicine 0.6 MG tablet Take 0.6 mg by mouth daily.     . famotidine (PEPCID) 20 MG tablet Take 20 mg by mouth 2 (two) times daily.    . ferrous sulfate 325 (65 FE) MG tablet Take 325 mg by mouth daily with breakfast.    . fluocinonide cream (LIDEX) [id number] % Apply 1 application topically daily as needed (itching).   2  . furosemide (LASIX) 20 MG tablet Take 1 tablet (20 mg total) by mouth daily. 30 tablet 3  . gabapentin (NEURONTIN) 300 MG capsule Take 300 mg by mouth 2 (two) times daily.    . hydrocortisone (ANUSOL-HC) 2.5 % rectal cream Place 1 application rectally 2 (two) times daily as needed for hemorrhoids or itching.    . hydrocortisone  cream 1 % Apply 1 application topically 2 (two) times daily as needed (for eczmea).    Marland Kitchen ipratropium (ATROVENT) 0.03 % nasal spray Place 2 sprays into both nostrils every 12 (twelve) hours. 30 mL 12  . irbesartan (AVAPRO) 300 MG tablet Take 1 tablet (300 mg total) by mouth at bedtime. 90 tablet 3  . isosorbide mononitrate (IMDUR) 30 MG 24 hr tablet Take 0.5 tablets (15 mg total) by mouth daily. 15 tablet 3  . latanoprost (XALATAN) 0.005 % ophthalmic solution Place 1 drop into both eyes at bedtime.    Marland Kitchen loratadine (CLARITIN) 10 MG tablet Take 10 mg by mouth daily.    . meclizine  (ANTIVERT) 25 MG tablet Take 25 mg by mouth 3 (three) times daily as needed for dizziness.    . nitroGLYCERIN (NITROLINGUAL) 0.4 MG/SPRAY spray Place 1 spray under the tongue every 5 (five) minutes x 3 doses as needed for chest pain.    . Omega-3 Fatty Acids (FISH OIL) 1000 MG CAPS Take 1,000 mg by mouth 2 (two) times daily.     . pseudoephedrine (SUDAFED) 60 MG tablet Take 60 mg by mouth every 4 (four) hours as needed for congestion.    Marland Kitchen spironolactone (ALDACTONE) 25 MG tablet Take 25 mg by mouth daily.    . Tamsulosin HCl (FLOMAX) 0.4 MG CAPS Take 0.4 mg by mouth daily.    Marland Kitchen triamcinolone cream (KENALOG) 0.1 % Apply 1 application topically 2 (two) times daily. Eczema  3  . warfarin (COUMADIN) 2 MG tablet TAKE AS DIRECTED BY COUMADIN CLINIC 160 tablet 1   No current facility-administered medications for this visit.    No Known Allergies  Social History   Social History  . Marital Status: Divorced    Spouse Name: N/A  . Number of Children: 0  . Years of Education: master's   Occupational History  . semi-retired    Social History Main Topics  . Smoking status: Former Smoker    Quit date: 08/22/1983  . Smokeless tobacco: Never Used  . Alcohol Use: No  . Drug Use: No  . Sexual Activity: Not on file   Other Topics Concern  . Not on file   Social History Narrative   Patient drinks 1-2 cups of caffeine daily.   Patient is right handed.     Review of Systems: General: negative for chills, fever, night sweats or weight changes.  Cardiovascular: negative for chest pain, dyspnea on exertion, edema, orthopnea, palpitations, paroxysmal nocturnal dyspnea or shortness of breath Dermatological: negative for rash Respiratory: negative for cough or wheezing Urologic: negative for hematuria Abdominal: negative for nausea, vomiting, diarrhea, bright red blood per rectum, melena, or hematemesis Neurologic: negative for visual changes, syncope, or dizziness All other systems reviewed and  are otherwise negative except as noted above.    Blood pressure 130/80, pulse 60, height 5\' 9"  (1.753 m), weight 214 lb (97.07 kg).  General appearance: alert, cooperative and no distress Neck: no carotid bruit and no JVD Lungs: clear to auscultation bilaterally Heart: regular rate and rhythm, S1, S2 normal, no murmur, click, rub or gallop Extremities: no LEE Pulses: 2+ and symmetric Skin: warm and dry Neurologic: Grossly normal  EKG not performed.   ASSESSMENT AND PLAN:   1. CAD: s/p recent LHC <6 months ago revealing change in circumflex anatomy with 50-60% stenosis in the RCA, and 70-75% stenosis in the distal circumflex artery. Recommendations were to manage medically as risk for triple therapy with PCI would be greater  than benefit, given the territory supplied, distal to the area is very small. He continues to have exertional CP with moderate activity. No resting CP. We will further increase his Imdur to 30 mg daily (SBP is stable in the 130s). I've discussed plan with Dr. Meda Coffee, DOD, who agrees with plan. F/u with Dr. Meda Coffee in 3 months, if still with recurrent exertional symptoms can revisit idea of PCI with Dr. [name].  2. PAF: rate is well controlled. Asymptomatic. On a/c with Coumadin. INRs followed in our coumadin clinic. He denies any abnormal bleeding.  3. Chronic Systolic HF: volume stable. He denies dyspnea. Continue lasix and low sodium diet.   4. H/o Benign Colonic Polyps: patient is overdue for routine colonoscopy. I've discussed case with Dr. Meda Coffee today, his primary cardiologist. She has cleared him for his colonoscopy. He is on Coumadin will need to arrange Lovenox bridge with pharmacy given his h/o CVA.   5. HTN: BP is well controlled.   PLAN  F/u with Dr. Meda Coffee in 2-3 months.   Lyda Jester PA-C 01/18/2016 1:45 PM

## 2016-01-30 ENCOUNTER — Other Ambulatory Visit: Payer: Self-pay | Admitting: Cardiology

## 2016-02-02 DIAGNOSIS — R3912 Poor urinary stream: Secondary | ICD-10-CM | POA: Diagnosis not present

## 2016-02-02 DIAGNOSIS — N401 Enlarged prostate with lower urinary tract symptoms: Secondary | ICD-10-CM | POA: Diagnosis not present

## 2016-02-16 ENCOUNTER — Ambulatory Visit: Payer: Medicare Other | Admitting: Cardiology

## 2016-02-16 ENCOUNTER — Ambulatory Visit (INDEPENDENT_AMBULATORY_CARE_PROVIDER_SITE_OTHER): Payer: Medicare Other | Admitting: *Deleted

## 2016-02-16 DIAGNOSIS — I4891 Unspecified atrial fibrillation: Secondary | ICD-10-CM | POA: Diagnosis not present

## 2016-02-16 DIAGNOSIS — Z7901 Long term (current) use of anticoagulants: Secondary | ICD-10-CM | POA: Diagnosis not present

## 2016-02-16 DIAGNOSIS — I482 Chronic atrial fibrillation, unspecified: Secondary | ICD-10-CM

## 2016-02-16 DIAGNOSIS — Z5181 Encounter for therapeutic drug level monitoring: Secondary | ICD-10-CM

## 2016-02-16 DIAGNOSIS — I6350 Cerebral infarction due to unspecified occlusion or stenosis of unspecified cerebral artery: Secondary | ICD-10-CM | POA: Diagnosis not present

## 2016-02-16 LAB — POCT INR: INR: 2.6

## 2016-03-11 ENCOUNTER — Ambulatory Visit (HOSPITAL_COMMUNITY)
Admission: EM | Admit: 2016-03-11 | Discharge: 2016-03-11 | Disposition: A | Discharge status: Home / Self Care | Payer: Medicare Other | Attending: Internal Medicine | Admitting: Internal Medicine

## 2016-03-11 ENCOUNTER — Encounter (HOSPITAL_COMMUNITY): Payer: Self-pay | Admitting: Emergency Medicine

## 2016-03-11 DIAGNOSIS — S335XXA Sprain of ligaments of lumbar spine, initial encounter: Secondary | ICD-10-CM

## 2016-03-11 DIAGNOSIS — M545 Low back pain, unspecified: Secondary | ICD-10-CM

## 2016-03-11 MED ORDER — CYCLOBENZAPRINE HCL 5 MG PO TABS: ORAL_TABLET | ORAL | Status: DC

## 2016-03-11 MED ORDER — PREDNISONE 5 MG (48) PO TBPK: 5.0000 mg | ORAL_TABLET | Freq: Every day | ORAL | Status: DC

## 2016-03-11 NOTE — Discharge Instructions (Signed)
You have a sprain to your back. Hopefully this will improve with use of Prednisone pack (take all of this) to help with inflammation. Use the muscle relaxer every 8 hours as needed. Heating pain will be helpful, just do night fall alseep with it. Gentle stretches will help also, see above for instruction. If you are not improving then please f/u with Orthopedics.    Musculoskeletal Pain Musculoskeletal pain is muscle and boney aches and pains. These pains can occur in any part of the body. Your caregiver may treat you without knowing the cause of the pain. They may treat you if blood or urine tests, X-rays, and other tests were normal.  CAUSES There is often not a definite cause or reason for these pains. These pains may be caused by a type of germ (virus). The discomfort may also come from overuse. Overuse includes working out too hard when your body is not fit. Boney aches also come from weather changes. Bone is sensitive to atmospheric pressure changes. HOME CARE INSTRUCTIONS   Ask when your test results will be ready. Make sure you get your test results.  Only take over-the-counter or prescription medicines for pain, discomfort, or fever as directed by your caregiver. If you were given medications for your condition, do not drive, operate machinery or power tools, or sign legal documents for 24 hours. Do not drink alcohol. Do not take sleeping pills or other medications that may interfere with treatment.  Continue all activities unless the activities cause more pain. When the pain lessens, slowly resume normal activities. Gradually increase the intensity and duration of the activities or exercise.  During periods of severe pain, bed rest may be helpful. Lay or sit in any position that is comfortable.  Putting ice on the injured area.  Put ice in a bag.  Place a towel between your skin and the bag.  Leave the ice on for 15 to 20 minutes, 3 to 4 times a day.  Follow up with your caregiver  for continued problems and no reason can be found for the pain. If the pain becomes worse or does not go away, it may be necessary to repeat tests or do additional testing. Your caregiver may need to look further for a possible cause. SEEK IMMEDIATE MEDICAL CARE IF:  You have pain that is getting worse and is not relieved by medications.  You develop chest pain that is associated with shortness or breath, sweating, feeling sick to your stomach (nauseous), or throw up (vomit).  Your pain becomes localized to the abdomen.  You develop any new symptoms that seem different or that concern you. MAKE SURE YOU:   Understand these instructions.  Will watch your condition.  Will get help right away if you are not doing well or get worse.   This information is not intended to replace advice given to you by your health care provider. Make sure you discuss any questions you have with your health care provider.   Document Released: 08/15/2005 Document Revised: 11/07/2011 Document Reviewed: 04/19/2013 [employer] Interactive Patient Education 2016 [place] [facility] A sprain is an injury in which a ligament is torn. The ligaments of the lower back are vulnerable to sprains. However, they are strong and require great force to be injured. These ligaments are important for stabilizing the spinal column. Sprains are classified into three categories. Grade 1 sprains cause pain, but the tendon is not lengthened. Grade 2 sprains include a lengthened ligament,  due to the ligament being stretched or partially ruptured. With grade 2 sprains there is still function, although the function may be decreased. Grade 3 sprains involve a complete tear of the tendon or muscle, and function is usually impaired. SYMPTOMS   Severe pain in the lower back.  Sometimes, a feeling of a "pop," "snap," or tear, at the time of injury.  Tenderness and sometimes swelling at the injury site.  Uncommonly,  bruising (contusion) within 48 hours of injury.  Muscle spasms in the back. CAUSES  Low back sprains occur when a force is placed on the ligaments that is greater than they can handle. Common causes of injury include:  Performing a stressful act while off-balance.  Repetitive stressful activities that involve movement of the lower back.  Direct hit (trauma) to the lower back. RISK INCREASES WITH:  Contact sports (football, wrestling).  Collisions (major skiing accidents).  Sports that require throwing or lifting (baseball, weightlifting).  Sports involving twisting of the spine (gymnastics, diving, tennis, golf).  Poor strength and flexibility.  Inadequate protection.  Previous back injury or surgery (especially fusion). PREVENTION  Wear properly fitted and padded protective equipment.  Warm up and stretch properly before activity.  Allow for adequate recovery between workouts.  Maintain physical fitness:  Strength, flexibility, and endurance.  Cardiovascular fitness.  Maintain a healthy body weight. PROGNOSIS  If treated properly, low back sprains usually heal with non-surgical treatment. The length of time for healing depends on the severity of the injury.  RELATED COMPLICATIONS   Recurring symptoms, resulting in a chronic problem.  Chronic inflammation and pain in the low back.  Delayed healing or resolution of symptoms, especially if activity is resumed too soon.  Prolonged impairment.  Unstable or arthritic joints of the low back. TREATMENT  Treatment first involves the use of ice and medicine, to reduce pain and inflammation. The use of strengthening and stretching exercises may help reduce pain with activity. These exercises may be performed at home or with a therapist. Severe injuries may require referral to a therapist for further evaluation and treatment, such as ultrasound. Your caregiver may advise that you wear a back brace or corset, to help reduce  pain and discomfort. Often, prolonged bed rest results in greater harm then benefit. Corticosteroid injections may be recommended. However, these should be reserved for the most serious cases. It is important to avoid using your back when lifting objects. At night, sleep on your back on a firm mattress, with a pillow placed under your knees. If non-surgical treatment is unsuccessful, surgery may be needed.  MEDICATION   If pain medicine is needed, nonsteroidal anti-inflammatory medicines (aspirin and ibuprofen), or other minor pain relievers (acetaminophen), are often advised.  Do not take pain medicine for 7 days before surgery.  Prescription pain relievers may be given, if your caregiver thinks they are needed. Use only as directed and only as much as you need.  Ointments applied to the skin may be helpful.  Corticosteroid injections may be given by your caregiver. These injections should be reserved for the most serious cases, because they may only be given a certain number of times. HEAT AND COLD  Cold treatment (icing) should be applied for 10 to 15 minutes every 2 to 3 hours for inflammation and pain, and immediately after activity that aggravates your symptoms. Use ice packs or an ice massage.  Heat treatment may be used before performing stretching and strengthening activities prescribed by your caregiver, physical therapist, or athletic  trainer. Use a heat pack or a warm water soak. SEEK MEDICAL CARE IF:   Symptoms get worse or do not improve in 2 to 4 weeks, despite treatment.  You develop numbness or weakness in either leg.  You lose bowel or bladder function.  Any of the following occur after surgery: fever, increased pain, swelling, redness, drainage of fluids, or bleeding in the affected area.  New, unexplained symptoms develop. (Drugs used in treatment may produce side effects.) EXERCISES  RANGE OF MOTION (ROM) AND STRETCHING EXERCISES - Low Back Sprain Most people with  lower back pain will find that their symptoms get worse with excessive bending forward (flexion) or arching at the lower back (extension). The exercises that will help resolve your symptoms will focus on the opposite motion.  Your physician, physical therapist or athletic trainer will help you determine which exercises will be most helpful to resolve your lower back pain. Do not complete any exercises without first consulting with your caregiver. Discontinue any exercises which make your symptoms worse, until you speak to your caregiver. If you have pain, numbness or tingling which travels down into your buttocks, leg or foot, the goal of the therapy is for these symptoms to move closer to your back and eventually resolve. Sometimes, these leg symptoms will get better, but your lower back pain may worsen. This is often an indication of progress in your rehabilitation. Be very alert to any changes in your symptoms and the activities in which you participated in the 24 hours prior to the change. Sharing this information with your caregiver will allow him or her to most efficiently treat your condition. These exercises may help you when beginning to rehabilitate your injury. Your symptoms may resolve with or without further involvement from your physician, physical therapist or athletic trainer. While completing these exercises, remember:   Restoring tissue flexibility helps normal motion to return to the joints. This allows healthier, less painful movement and activity.  An effective stretch should be held for at least 30 seconds.  A stretch should never be painful. You should only feel a gentle lengthening or release in the stretched tissue. FLEXION RANGE OF MOTION AND STRETCHING EXERCISES: STRETCH - Flexion, Single Knee to Chest   Lie on a firm bed or floor with both legs extended in front of you.  Keeping one leg in contact with the floor, bring your opposite knee to your chest. Hold your leg in  place by either grabbing behind your thigh or at your knee.  Pull until you feel a gentle stretch in your low back. Hold __________ seconds.  Slowly release your grasp and repeat the exercise with the opposite side. Repeat __________ times. Complete this exercise __________ times per day.  STRETCH - Flexion, Double Knee to Chest  Lie on a firm bed or floor with both legs extended in front of you.  Keeping one leg in contact with the floor, bring your opposite knee to your chest.  Tense your stomach muscles to support your back and then lift your other knee to your chest. Hold your legs in place by either grabbing behind your thighs or at your knees.  Pull both knees toward your chest until you feel a gentle stretch in your low back. Hold __________ seconds.  Tense your stomach muscles and slowly return one leg at a time to the floor. Repeat __________ times. Complete this exercise __________ times per day.  STRETCH - Low Trunk Rotation  Lie on a firm bed  or floor. Keeping your legs in front of you, bend your knees so they are both pointed toward the ceiling and your feet are flat on the floor.  Extend your arms out to the side. This will stabilize your upper body by keeping your shoulders in contact with the floor.  Gently and slowly drop both knees together to one side until you feel a gentle stretch in your low back. Hold for __________ seconds.  Tense your stomach muscles to support your lower back as you bring your knees back to the starting position. Repeat the exercise to the other side. Repeat __________ times. Complete this exercise __________ times per day  EXTENSION RANGE OF MOTION AND FLEXIBILITY EXERCISES: STRETCH - Extension, Prone on Elbows   Lie on your stomach on the floor, a bed will be too soft. Place your palms about shoulder width apart and at the height of your head.  Place your elbows under your shoulders. If this is too painful, stack pillows under your  chest.  Allow your body to relax so that your hips drop lower and make contact more completely with the floor.  Hold this position for __________ seconds.  Slowly return to lying flat on the floor. Repeat __________ times. Complete this exercise __________ times per day.  RANGE OF MOTION - Extension, Prone Press Ups  Lie on your stomach on the floor, a bed will be too soft. Place your palms about shoulder width apart and at the height of your head.  Keeping your back as relaxed as possible, slowly straighten your elbows while keeping your hips on the floor. You may adjust the placement of your hands to maximize your comfort. As you gain motion, your hands will come more underneath your shoulders.  Hold this position __________ seconds.  Slowly return to lying flat on the floor. Repeat __________ times. Complete this exercise __________ times per day.  RANGE OF MOTION- Quadruped, Neutral Spine   Assume a hands and knees position on a firm surface. Keep your hands under your shoulders and your knees under your hips. You may place padding under your knees for comfort.  Drop your head and point your tailbone toward the ground below you. This will round out your lower back like an angry cat. Hold this position for __________ seconds.  Slowly lift your head and release your tail bone so that your back sags into a large arch, like an old horse.  Hold this position for __________ seconds.  Repeat this until you feel limber in your low back.  Now, find your "sweet spot." This will be the most comfortable position somewhere between the two previous positions. This is your neutral spine. Once you have found this position, tense your stomach muscles to support your low back.  Hold this position for __________ seconds. Repeat __________ times. Complete this exercise __________ times per day.  STRENGTHENING EXERCISES - Low Back Sprain These exercises may help you when beginning to rehabilitate  your injury. These exercises should be done near your "sweet spot." This is the neutral, low-back arch, somewhere between fully rounded and fully arched, that is your least painful position. When performed in this safe range of motion, these exercises can be used for people who have either a flexion or extension based injury. These exercises may resolve your symptoms with or without further involvement from your physician, physical therapist or athletic trainer. While completing these exercises, remember:   Muscles can gain both the endurance and the strength needed for  everyday activities through controlled exercises.  Complete these exercises as instructed by your physician, physical therapist or athletic trainer. Increase the resistance and repetitions only as guided.  You may experience muscle soreness or fatigue, but the pain or discomfort you are trying to eliminate should never worsen during these exercises. If this pain does worsen, stop and make certain you are following the directions exactly. If the pain is still present after adjustments, discontinue the exercise until you can discuss the trouble with your caregiver. STRENGTHENING - Deep Abdominals, Pelvic Tilt   Lie on a firm bed or floor. Keeping your legs in front of you, bend your knees so they are both pointed toward the ceiling and your feet are flat on the floor.  Tense your lower abdominal muscles to press your low back into the floor. This motion will rotate your pelvis so that your tail bone is scooping upwards rather than pointing at your feet or into the floor. With a gentle tension and even breathing, hold this position for __________ seconds. Repeat __________ times. Complete this exercise __________ times per day.  STRENGTHENING - Abdominals, Crunches   Lie on a firm bed or floor. Keeping your legs in front of you, bend your knees so they are both pointed toward the ceiling and your feet are flat on the floor. Cross your  arms over your chest.  Slightly tip your chin down without bending your neck.  Tense your abdominals and slowly lift your trunk high enough to just clear your shoulder blades. Lifting higher can put excessive stress on the lower back and does not further strengthen your abdominal muscles.  Control your return to the starting position. Repeat __________ times. Complete this exercise __________ times per day.  STRENGTHENING - Quadruped, Opposite UE/LE Lift   Assume a hands and knees position on a firm surface. Keep your hands under your shoulders and your knees under your hips. You may place padding under your knees for comfort.  Find your neutral spine and gently tense your abdominal muscles so that you can maintain this position. Your shoulders and hips should form a rectangle that is parallel with the floor and is not twisted.  Keeping your trunk steady, lift your right hand no higher than your shoulder and then your left leg no higher than your hip. Make sure you are not holding your breath. Hold this position for __________ seconds.  Continuing to keep your abdominal muscles tense and your back steady, slowly return to your starting position. Repeat with the opposite arm and leg. Repeat __________ times. Complete this exercise __________ times per day.  STRENGTHENING - Abdominals and Quadriceps, Straight Leg Raise   Lie on a firm bed or floor with both legs extended in front of you.  Keeping one leg in contact with the floor, bend the other knee so that your foot can rest flat on the floor.  Find your neutral spine, and tense your abdominal muscles to maintain your spinal position throughout the exercise.  Slowly lift your straight leg off the floor about 6 inches for a count of 15, making sure to not hold your breath.  Still keeping your neutral spine, slowly lower your leg all the way to the floor. Repeat this exercise with each leg __________ times. Complete this exercise  __________ times per day. POSTURE AND BODY MECHANICS CONSIDERATIONS - Low Back Sprain Keeping correct posture when sitting, standing or completing your activities will reduce the stress put on different body tissues, allowing injured  tissues a chance to heal and limiting painful experiences. The following are general guidelines for improved posture. Your physician or physical therapist will provide you with any instructions specific to your needs. While reading these guidelines, remember:  The exercises prescribed by your provider will help you have the flexibility and strength to maintain correct postures.  The correct posture provides the best environment for your joints to work. All of your joints have less wear and tear when properly supported by a spine with good posture. This means you will experience a healthier, less painful body.  Correct posture must be practiced with all of your activities, especially prolonged sitting and standing. Correct posture is as important when doing repetitive low-stress activities (typing) as it is when doing a single heavy-load activity (lifting). RESTING POSITIONS Consider which positions are most painful for you when choosing a resting position. If you have pain with flexion-based activities (sitting, bending, stooping, squatting), choose a position that allows you to rest in a less flexed posture. You would want to avoid curling into a fetal position on your side. If your pain worsens with extension-based activities (prolonged standing, working overhead), avoid resting in an extended position such as sleeping on your stomach. Most people will find more comfort when they rest with their spine in a more neutral position, neither too rounded nor too arched. Lying on a non-sagging bed on your side with a pillow between your knees, or on your back with a pillow under your knees will often provide some relief. Keep in mind, being in any one position for a prolonged  period of time, no matter how correct your posture, can still lead to stiffness. PROPER SITTING POSTURE In order to minimize stress and discomfort on your spine, you must sit with correct posture. Sitting with good posture should be effortless for a healthy body. Returning to good posture is a gradual process. Many people can work toward this most comfortably by using various supports until they have the flexibility and strength to maintain this posture on their own. When sitting with proper posture, your ears will fall over your shoulders and your shoulders will fall over your hips. You should use the back of the chair to support your upper back. Your lower back will be in a neutral position, just slightly arched. You may place a small pillow or folded towel at the base of your lower back for  support.  When working at a desk, create an environment that supports good, upright posture. Without extra support, muscles tire, which leads to excessive strain on joints and other tissues. Keep these recommendations in mind: CHAIR:  A chair should be able to slide under your desk when your back makes contact with the back of the chair. This allows you to work closely.  The chair's height should allow your eyes to be level with the upper part of your monitor and your hands to be slightly lower than your elbows. BODY POSITION  Your feet should make contact with the floor. If this is not possible, use a foot rest.  Keep your ears over your shoulders. This will reduce stress on your neck and low back. INCORRECT SITTING POSTURES  If you are feeling tired and unable to assume a healthy sitting posture, do not slouch or slump. This puts excessive strain on your back tissues, causing more damage and pain. Healthier options include:  Using more support, like a lumbar pillow.  Switching tasks to something that requires you to be upright  or walking.  Talking a brief walk.  Lying down to rest in a neutral-spine  position. PROLONGED STANDING WHILE SLIGHTLY LEANING FORWARD  When completing a task that requires you to lean forward while standing in one place for a long time, place either foot up on a stationary 2-4 inch high object to help maintain the best posture. When both feet are on the ground, the lower back tends to lose its slight inward curve. If this curve flattens (or becomes too large), then the back and your other joints will experience too much stress, tire more quickly, and can cause pain. CORRECT STANDING POSTURES Proper standing posture should be assumed with all daily activities, even if they only take a few moments, like when brushing your teeth. As in sitting, your ears should fall over your shoulders and your shoulders should fall over your hips. You should keep a slight tension in your abdominal muscles to brace your spine. Your tailbone should point down to the ground, not behind your body, resulting in an over-extended swayback posture.  INCORRECT STANDING POSTURES  Common incorrect standing postures include a forward head, locked knees and/or an excessive swayback. WALKING Walk with an upright posture. Your ears, shoulders and hips should all line-up. PROLONGED ACTIVITY IN A FLEXED POSITION When completing a task that requires you to bend forward at your waist or lean over a low surface, try to find a way to stabilize 3 out of 4 of your limbs. You can place a hand or elbow on your thigh or rest a knee on the surface you are reaching across. This will provide you more stability, so that your muscles do not tire as quickly. By keeping your knees relaxed, or slightly bent, you will also reduce stress across your lower back. CORRECT LIFTING TECHNIQUES DO :  Assume a wide stance. This will provide you more stability and the opportunity to get as close as possible to the object which you are lifting.  Tense your abdominals to brace your spine. Bend at the knees and hips. Keeping your back  locked in a neutral-spine position, lift using your leg muscles. Lift with your legs, keeping your back straight.  Test the weight of unknown objects before attempting to lift them.  Try to keep your elbows locked down at your sides in order get the best strength from your shoulders when carrying an object.  Always ask for help when lifting heavy or awkward objects. INCORRECT LIFTING TECHNIQUES DO NOT:   Lock your knees when lifting, even if it is a small object.  Bend and twist. Pivot at your feet or move your feet when needing to change directions.  Assume that you can safely pick up even a paperclip without proper posture.   This information is not intended to replace advice given to you by your health care provider. Make sure you discuss any questions you have with your health care provider.   Document Released: 08/15/2005 Document Revised: 09/05/2014 Document Reviewed: 11/27/2008 [employer] Patient Education [employer].

## 2016-03-11 NOTE — ED Provider Notes (Signed)
CSN: [id number]     Arrival date & time 03/11/16  1205 History   None    Chief Complaint  Patient presents with  . Back Pain   (Consider location/radiation/quality/duration/timing/severity/associated sxs/prior Treatment) HPI Comments: Vincent [name] is a very pleasant 80 yo male who presents with an injury to the lower back. He reports that last Thursday (8 days ago), he was moving a TV, and could not hold it and felt a "strain" to his back. He let the TV go at that point. Since that time he has felt pain along the low back, both sides. No radicular pain or numbness. No weakness is noted. No loss of bowel of bladder control. Pain with ROM, improves with rest.   Patient is a 80 y.o. male presenting with back pain. The history is provided by the patient.  Back Pain Associated symptoms: no dysuria, no numbness and no weakness     Past Medical History  Diagnosis Date  . Hypertension   . Stroke ([place])   . Syncope and collapse   . Hyperlipidemia   . Coronary artery disease   . Atrial fibrillation ([place])   . V-tach ([place])   . Bradycardia   . Alcoholic cardiomyopathy ([place])   . Pulmonary eosinophilia ([place])   . Unspecified glaucoma   . GERD (gastroesophageal reflux disease)   . Long term (current) use of anticoagulants   . Gout   . Foot drop, left 03/11/2015  . Gait disorder 03/11/2015  . 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 Hemiparesis and alteration of sensations as late effects of stroke ([place]) 09/25/2015   Past Surgical History  Procedure Laterality Date  . Loop recorder  09/2007  . Knee surgery    . Cardiac catheterization N/A 07/30/2015    Procedure: Left Heart Cath and Coronary Angiography;  Surgeon: Belva Crome, MD;  Location: [facility];  Service: Cardiovascular;  Laterality: N/A;   Family History  Problem Relation Age of Onset  . Stroke Neg Hx   . Colon cancer Mother   . Hypertension Mother   .  Cancer Mother   . Prostate cancer Brother   . Cancer Brother   . Heart disease Father   . Heart attack Father   . Hypertension Father   . Heart attack Sister   . Cancer Sister    Social History  Substance Use Topics  . Smoking status: Former Smoker    Quit date: 08/22/1983  . Smokeless tobacco: Never Used  . Alcohol Use: No    Review of Systems  Genitourinary: Negative for dysuria and difficulty urinating.  Musculoskeletal: Positive for myalgias and back pain. Negative for gait problem.  Skin: Negative for rash.  Neurological: Negative for weakness and numbness.    Allergies  Review of patient's allergies indicates no known allergies.  Home Medications   Prior to Admission medications   Medication Sig Start Date End Date Taking? Authorizing Provider  aspirin 81 MG chewable tablet Chew 81 mg by mouth daily.    Historical Provider, MD  atorvastatin (LIPITOR) 40 MG tablet TAKE 1 TABLET BY MOUTH EVERY DAY 02/01/16   [name], MD  brimonidine (ALPHAGAN P) 0.1 % SOLN Place 1 drop into both eyes 2 (two) times daily.     Historical Provider, MD  carvedilol (COREG) 6.25 MG tablet Take 1 tablet (6.25 mg total) by mouth 2 (two) times daily. 10/23/15   Houston Siren  Hazel Sams, MD  colchicine 0.6 MG tablet Take 0.6 mg by mouth daily.     Historical Provider, MD  cyclobenzaprine (FLEXERIL) 5 MG tablet 1/2 to 1 tablet po q 8 hours prn muscle spasm and pain 03/11/16   Bjorn Pippin, PA-C  famotidine (PEPCID) 20 MG tablet Take 20 mg by mouth 2 (two) times daily.    Historical Provider, MD  ferrous sulfate 325 (65 FE) MG tablet Take 325 mg by mouth daily with breakfast.    Historical Provider, MD  fluocinonide cream (LIDEX) [id number] % Apply 1 application topically daily as needed (itching).  01/13/15   Historical Provider, MD  furosemide (LASIX) 20 MG tablet Take 1 tablet (20 mg total) by mouth daily. 07/21/15   [name], MD  gabapentin (NEURONTIN) 300 MG capsule Take 300 mg by mouth 2  (two) times daily.    Historical Provider, MD  hydrocortisone (ANUSOL-HC) 2.5 % rectal cream Place 1 application rectally 2 (two) times daily as needed for hemorrhoids or itching.    Historical Provider, MD  hydrocortisone cream 1 % Apply 1 application topically 2 (two) times daily as needed (for eczmea).    Historical Provider, MD  ipratropium (ATROVENT) 0.03 % nasal spray Place 2 sprays into both nostrils every 12 (twelve) hours. 12/10/15   Janne Napoleon, NP  irbesartan (AVAPRO) 300 MG tablet Take 1 tablet (300 mg total) by mouth at bedtime. 07/21/15   [name], MD  isosorbide mononitrate (IMDUR) 30 MG 24 hr tablet Take 1 tablet (30 mg total) by mouth daily. 01/18/16   Brittainy M Simmons, PA-C  latanoprost (XALATAN) 0.005 % ophthalmic solution Place 1 drop into both eyes at bedtime.    Historical Provider, MD  loratadine (CLARITIN) 10 MG tablet Take 10 mg by mouth daily.    Historical Provider, MD  meclizine (ANTIVERT) 25 MG tablet Take 25 mg by mouth 3 (three) times daily as needed for dizziness.    Historical Provider, MD  nitroGLYCERIN (NITROLINGUAL) 0.4 MG/SPRAY spray Place 1 spray under the tongue every 5 (five) minutes x 3 doses as needed for chest pain.    Historical Provider, MD  Omega-3 Fatty Acids (FISH OIL) 1000 MG CAPS Take 1,000 mg by mouth 2 (two) times daily.     Historical Provider, MD  predniSONE (STERAPRED UNI-PAK 48 TAB) 5 MG (48) TBPK tablet Take 1 tablet (5 mg total) by mouth daily. 03/11/16   Bjorn Pippin, PA-C  pseudoephedrine (SUDAFED) 60 MG tablet Take 60 mg by mouth every 4 (four) hours as needed for congestion.    Historical Provider, MD  spironolactone (ALDACTONE) 25 MG tablet Take 25 mg by mouth daily.    Historical Provider, MD  Tamsulosin HCl (FLOMAX) 0.4 MG CAPS Take 0.4 mg by mouth daily. 12/28/11   Historical Provider, MD  triamcinolone cream (KENALOG) 0.1 % Apply 1 application topically 2 (two) times daily. Eczema 01/20/15   Historical Provider, MD  warfarin  (COUMADIN) 2 MG tablet TAKE AS DIRECTED BY COUMADIN CLINIC 12/30/15   [name], MD   Meds Ordered and Administered this Visit  Medications - No data to display  BP 161/96 mmHg  Pulse 83  Temp(Src) 98 F (36.7 C) (Oral)  Resp 16  Ht 5\' 9"  (1.753 m)  Wt 198 lb (89.812 kg)  BMI 29.23 kg/m2  SpO2 95% No data found.   Physical Exam  Constitutional: He is oriented to person, place, and time. He appears well-developed and well-nourished. No distress.  Pulmonary/Chest: Effort normal.  Musculoskeletal:  No trauma to low back. No rashes. Pain with palpation along the para lumbar region. Spine is non-tender to palpation. Full ROM with pain to flexion and extension.   Neurological: He is alert and oriented to person, place, and time. He displays normal reflexes. He exhibits normal muscle tone. Coordination normal.  Motor 5/5 bilateral lower extremities  Skin: Skin is warm and dry. He is not diaphoretic.  Psychiatric: His behavior is normal.  Nursing note and vitals reviewed.   ED Course  Procedures (including critical care time)  Labs Review Labs Reviewed - No data to display  Imaging Review No results found.   Visual Acuity Review  Right Eye Distance:   Left Eye Distance:   Bilateral Distance:    Right Eye Near:   Left Eye Near:    Bilateral Near:         MDM   1. Lumbar sprain, initial encounter   2. Bilateral low back pain without sciatica    No neuro deficit. Exam c/w Musculoskeletal injury. Treat conservatively with prednisone pack and flexeril. Use local heat and gentle stretching. If worsens then he will follow up with his orthopedic.      Bjorn Pippin, PA-C 03/11/16 1329

## 2016-03-11 NOTE — ED Notes (Signed)
PT reports he was lifting something last Thursday and believes he injured his back. Pain has been persistent since then.

## 2016-03-15 ENCOUNTER — Ambulatory Visit (INDEPENDENT_AMBULATORY_CARE_PROVIDER_SITE_OTHER): Payer: Medicare Other | Admitting: *Deleted

## 2016-03-15 DIAGNOSIS — Z5181 Encounter for therapeutic drug level monitoring: Secondary | ICD-10-CM

## 2016-03-15 DIAGNOSIS — I4891 Unspecified atrial fibrillation: Secondary | ICD-10-CM | POA: Diagnosis not present

## 2016-03-15 DIAGNOSIS — Z7901 Long term (current) use of anticoagulants: Secondary | ICD-10-CM

## 2016-03-15 DIAGNOSIS — I6350 Cerebral infarction due to unspecified occlusion or stenosis of unspecified cerebral artery: Secondary | ICD-10-CM | POA: Diagnosis not present

## 2016-03-15 DIAGNOSIS — I482 Chronic atrial fibrillation, unspecified: Secondary | ICD-10-CM

## 2016-03-15 LAB — POCT INR: INR: 3.1

## 2016-04-05 ENCOUNTER — Ambulatory Visit (INDEPENDENT_AMBULATORY_CARE_PROVIDER_SITE_OTHER): Payer: Medicare Other | Admitting: *Deleted

## 2016-04-05 DIAGNOSIS — I4891 Unspecified atrial fibrillation: Secondary | ICD-10-CM

## 2016-04-05 DIAGNOSIS — I482 Chronic atrial fibrillation, unspecified: Secondary | ICD-10-CM

## 2016-04-05 DIAGNOSIS — Z5181 Encounter for therapeutic drug level monitoring: Secondary | ICD-10-CM

## 2016-04-05 DIAGNOSIS — I6350 Cerebral infarction due to unspecified occlusion or stenosis of unspecified cerebral artery: Secondary | ICD-10-CM

## 2016-04-05 DIAGNOSIS — Z7901 Long term (current) use of anticoagulants: Secondary | ICD-10-CM | POA: Diagnosis not present

## 2016-04-05 LAB — POCT INR: INR: 3.3

## 2016-04-14 ENCOUNTER — Encounter: Payer: Self-pay | Admitting: Cardiology

## 2016-04-27 ENCOUNTER — Encounter: Payer: Self-pay | Admitting: Cardiology

## 2016-04-27 ENCOUNTER — Encounter (INDEPENDENT_AMBULATORY_CARE_PROVIDER_SITE_OTHER): Payer: Self-pay

## 2016-04-27 ENCOUNTER — Ambulatory Visit (INDEPENDENT_AMBULATORY_CARE_PROVIDER_SITE_OTHER): Payer: Medicare Other | Admitting: Cardiology

## 2016-04-27 ENCOUNTER — Ambulatory Visit (INDEPENDENT_AMBULATORY_CARE_PROVIDER_SITE_OTHER): Payer: Medicare Other | Admitting: *Deleted

## 2016-04-27 VITALS — BP 134/68 | HR 64 | Ht 69.0 in | Wt 205.0 lb

## 2016-04-27 DIAGNOSIS — E785 Hyperlipidemia, unspecified: Secondary | ICD-10-CM | POA: Diagnosis not present

## 2016-04-27 DIAGNOSIS — I10 Essential (primary) hypertension: Secondary | ICD-10-CM

## 2016-04-27 DIAGNOSIS — I4891 Unspecified atrial fibrillation: Secondary | ICD-10-CM | POA: Diagnosis not present

## 2016-04-27 DIAGNOSIS — I6350 Cerebral infarction due to unspecified occlusion or stenosis of unspecified cerebral artery: Secondary | ICD-10-CM | POA: Diagnosis not present

## 2016-04-27 DIAGNOSIS — I482 Chronic atrial fibrillation, unspecified: Secondary | ICD-10-CM

## 2016-04-27 DIAGNOSIS — Z5181 Encounter for therapeutic drug level monitoring: Secondary | ICD-10-CM

## 2016-04-27 DIAGNOSIS — Z7901 Long term (current) use of anticoagulants: Secondary | ICD-10-CM

## 2016-04-27 LAB — COMPREHENSIVE METABOLIC PANEL
ALT: 19 U/L (ref 9–46)
AST: 20 U/L (ref 10–35)
Albumin: 4 g/dL (ref 3.6–5.1)
Alkaline Phosphatase: 87 U/L (ref 40–115)
BUN: 9 mg/dL (ref 7–25)
CO2: 27 mmol/L (ref 20–31)
Calcium: 9 mg/dL (ref 8.6–10.3)
Chloride: 100 mmol/L (ref 98–110)
Creat: 0.98 mg/dL (ref 0.70–1.18)
Glucose, Bld: 188 mg/dL — ABNORMAL HIGH (ref 65–99)
Potassium: 4.2 mmol/L (ref 3.5–5.3)
Sodium: 138 mmol/L (ref 135–146)
Total Bilirubin: 0.8 mg/dL (ref 0.2–1.2)
Total Protein: 7.1 g/dL (ref 6.1–8.1)

## 2016-04-27 LAB — CBC WITH DIFFERENTIAL/PLATELET
Basophils Absolute: 37 cells/uL (ref 0–200)
Basophils Relative: 1 %
Eosinophils Absolute: 185 cells/uL (ref 15–500)
Eosinophils Relative: 5 %
HCT: 39.2 % (ref 38.5–50.0)
Hemoglobin: 12.9 g/dL — ABNORMAL LOW (ref 13.2–17.1)
Lymphocytes Relative: 28 %
Lymphs Abs: 1036 cells/uL (ref 850–3900)
MCH: 26.9 pg — ABNORMAL LOW (ref 27.0–33.0)
MCHC: 32.9 g/dL (ref 32.0–36.0)
MCV: 81.7 fL (ref 80.0–100.0)
MPV: 11.2 fL (ref 7.5–12.5)
Monocytes Absolute: 444 cells/uL (ref 200–950)
Monocytes Relative: 12 %
Neutro Abs: 1998 cells/uL (ref 1500–7800)
Neutrophils Relative %: 54 %
Platelets: 214 10*3/uL (ref 140–400)
RBC: 4.8 MIL/uL (ref 4.20–5.80)
RDW: 15 % (ref 11.0–15.0)
WBC: 3.7 10*3/uL — ABNORMAL LOW (ref 3.8–10.8)

## 2016-04-27 LAB — LIPID PANEL
Cholesterol: 146 mg/dL (ref 125–200)
HDL: 57 mg/dL (ref >=40)
LDL Cholesterol: 64 mg/dL (ref <130)
Total CHOL/HDL Ratio: 2.6 Ratio (ref <=5.0)
Triglycerides: 126 mg/dL (ref <150)
VLDL: 25 mg/dL (ref <30)

## 2016-04-27 LAB — POCT INR: INR: 3

## 2016-04-27 LAB — TSH: TSH: 1.03 mIU/L (ref 0.40–4.50)

## 2016-04-27 MED ORDER — SPIRONOLACTONE 25 MG PO TABS: 25.0000 mg | ORAL_TABLET | Freq: Every day | ORAL | 6 refills | Status: DC

## 2016-04-27 MED ORDER — CARVEDILOL 6.25 MG PO TABS: 6.2500 mg | ORAL_TABLET | Freq: Two times a day (BID) | ORAL | 6 refills | Status: DC

## 2016-04-27 NOTE — Patient Instructions (Signed)
Medication Instructions:   Your physician recommends that you continue on your current medications as directed. Please refer to the Current Medication list given to you today.   Labwork:  TODAY-CMET, TSH, CBC W DIFF, AND LIPIDS    Follow-Up:  Your physician wants you to follow-up in: [place] will receive a reminder letter in the mail two months in advance. If you don't receive a letter, please call our office to schedule the follow-up appointment.       If you need a refill on your cardiac medications before your next appointment, please call your pharmacy.

## 2016-04-27 NOTE — Progress Notes (Signed)
04/27/2016 Vincent [name]   09/14/1935  [id number]  Primary Physician No PCP Per Patient Primary Cardiologist: Dr. Meda [name]  Reason for Visit/CC: F/u for CAD  HPI:  80 y/o male, followed by Dr. Meda [name], who presents to clinic for f/u. He has h/o CAD, chronic systolic HF/ alcoholic cardiomyopathy, PAF that is rate conrolled on chronic coumadin as well as h/o HTN and prior h/o CVA. In 06/2015 he underwent a NST for CP evaluation. This was an abnormal study. Subsequent LHC per Dr. Tamala [name] 12/16 demonstrated patent previously placed mid RCA stent with 50-60% narrowing distal to the stent, unchanged from prior cath in 2006. There was also a codominant left coronary system with luminal irregularities in LAD and distal circumflex 70-75% stenosis before the origin of a small branching obtuse marginal. The distal circumflex disease has progressed since 2006. The territory supplied beyond the stenosis is small. EF was 35%. Dr. Tamala [name] felt that the circumflex would be best managed medically, given the territory supplied by this vessel is small and stenting would have required triple therapy and risk of bleeding would be greater than the benefit achieved by stenting this relatively limited territory. He was treated medically.    He was seen by Dr. Meda [name] 10/2015. At that time, he seemed very concern regarding medical management of his circumflex lesion.  He was very anxious given 2 of his sisters died secondary to heart attacks. Imdur was added. Dr. Meda [name] instructed him to f/u in 2 months to reassess. If still symptomatic, she would reconsider PCI of the circumflex.   He presents back for f/u. He continues to have exertional CP with moderate physical activity. This occurs ~2-3 times a week and resolves with rest. It is improved some with addition of Imdur. He is only taking a low dose, 15 mg daily. He is tolerating well w/o HA or other side effects. BP is stable. No resting chest pain and no dyspnea.   04/27/16 - today  he states that he feels well, no recent episodes of chest pain, no SOB, palpitations, syncope, no LE edema.  He has been complaint with his meds, no bleeding with coumadin. He hasn't had his labs checked in a while..   Current Outpatient Prescriptions  Medication Sig Dispense Refill  . aspirin 81 MG chewable tablet Chew 81 mg by mouth daily.    Marland Kitchen atorvastatin (LIPITOR) 40 MG tablet TAKE 1 TABLET BY MOUTH EVERY DAY 90 tablet 3  . brimonidine (ALPHAGAN P) 0.1 % SOLN Place 1 drop into both eyes 2 (two) times daily.     . carvedilol (COREG) 6.25 MG tablet Take 1 tablet (6.25 mg total) by mouth 2 (two) times daily. 60 tablet 10  . colchicine 0.6 MG tablet Take 0.6 mg by mouth daily.     . cyclobenzaprine (FLEXERIL) 5 MG tablet 1/2 to 1 tablet po q 8 hours prn muscle spasm and pain 20 tablet 0  . famotidine (PEPCID) 20 MG tablet Take 20 mg by mouth 2 (two) times daily.    . ferrous sulfate 325 (65 FE) MG tablet Take 325 mg by mouth daily with breakfast.    . fluocinonide cream (LIDEX) [id number] % Apply 1 application topically daily as needed (itching).   2  . furosemide (LASIX) 20 MG tablet Take 1 tablet (20 mg total) by mouth daily. 30 tablet 3  . gabapentin (NEURONTIN) 300 MG capsule Take 300 mg by mouth 2 (two) times daily.    . hydrocortisone (ANUSOL-HC) 2.5 % rectal  cream Place 1 application rectally 2 (two) times daily as needed for hemorrhoids or itching.    . hydrocortisone cream 1 % Apply 1 application topically 2 (two) times daily as needed (for eczmea).    Marland Kitchen ipratropium (ATROVENT) 0.03 % nasal spray Place 2 sprays into both nostrils every 12 (twelve) hours. 30 mL 12  . irbesartan (AVAPRO) 300 MG tablet Take 1 tablet (300 mg total) by mouth at bedtime. 90 tablet 3  . isosorbide mononitrate (IMDUR) 30 MG 24 hr tablet Take 1 tablet (30 mg total) by mouth daily. 90 tablet 3  . latanoprost (XALATAN) 0.005 % ophthalmic solution Place 1 drop into both eyes at bedtime.    Marland Kitchen loratadine (CLARITIN) 10 MG  tablet Take 10 mg by mouth daily.    . meclizine (ANTIVERT) 25 MG tablet Take 25 mg by mouth 3 (three) times daily as needed for dizziness.    . nitroGLYCERIN (NITROLINGUAL) 0.4 MG/SPRAY spray Place 1 spray under the tongue every 5 (five) minutes x 3 doses as needed for chest pain.    . Omega-3 Fatty Acids (FISH OIL) 1000 MG CAPS Take 1,000 mg by mouth 2 (two) times daily.     . predniSONE (STERAPRED UNI-PAK 48 TAB) 5 MG (48) TBPK tablet Take 1 tablet (5 mg total) by mouth daily. 48 tablet 0  . pseudoephedrine (SUDAFED) 60 MG tablet Take 60 mg by mouth every 4 (four) hours as needed for congestion.    Marland Kitchen spironolactone (ALDACTONE) 25 MG tablet Take 25 mg by mouth daily.    . Tamsulosin HCl (FLOMAX) 0.4 MG CAPS Take 0.4 mg by mouth daily.    Marland Kitchen triamcinolone cream (KENALOG) 0.1 % Apply 1 application topically 2 (two) times daily. Eczema  3  . warfarin (COUMADIN) 2 MG tablet TAKE AS DIRECTED BY COUMADIN CLINIC 160 tablet 1   No current facility-administered medications for this visit.     No Known Allergies  Social History   Social History  . Marital status: Divorced    Spouse name: N/A  . Number of children: 0  . Years of education: master's   Occupational History  . semi-retired    Social History Main Topics  . Smoking status: Former Smoker    Quit date: 08/22/1983  . Smokeless tobacco: Never Used  . Alcohol use No  . Drug use: No  . Sexual activity: Not on file   Other Topics Concern  . Not on file   Social History Narrative   Patient drinks 1-2 cups of caffeine daily.   Patient is right handed.     Review of Systems: General: negative for chills, fever, night sweats or weight changes.  Cardiovascular: negative for chest pain, dyspnea on exertion, edema, orthopnea, palpitations, paroxysmal nocturnal dyspnea or shortness of breath Dermatological: negative for rash Respiratory: negative for cough or wheezing Urologic: negative for hematuria Abdominal: negative for  nausea, vomiting, diarrhea, bright red blood per rectum, melena, or hematemesis Neurologic: negative for visual changes, syncope, or dizziness All other systems reviewed and are otherwise negative except as noted above.  Blood pressure 134/68, pulse 64, height 5\' 9"  (1.753 m), weight 205 lb (93 kg).  General appearance: alert, cooperative and no distress Neck: no carotid bruit and no JVD Lungs: clear to auscultation bilaterally Heart: regular rate and rhythm, S1, S2 normal, no murmur, click, rub or gallop Extremities: no LEE Pulses: 2+ and symmetric Skin: warm and dry Neurologic: Grossly normal  EKG not performed.     ASSESSMENT AND  PLAN:   1. CAD: s/p recent LHC in 07/2015 with circumflex anatomy with 50-60% stenosis in the RCA, and 70-75% stenosis in the distal circumflex artery. Recommendations were to manage medically as risk for triple therapy with PCI would be greater than benefit, given the territory supplied, distal to the area is very small. He continues to have exertional CP with moderate activity. His symptoms have improved. We will continue current regimen.  2. PAF: rate is well controlled. Asymptomatic. On a/c with Coumadin. INRs followed in our coumadin clinic. He denies any abnormal bleeding.  3. Chronic Systolic HF: volume stable. He denies dyspnea. Continue lasix and low sodium diet.   4. HTN: BP is well controlled.   PLAN  F/u with Dr. Meda [name] in 1 year, labs - CMP, lipids, CBC today.   Ena Dawley, MD 04/27/2016 11:35 AM

## 2016-05-16 DIAGNOSIS — H539 Unspecified visual disturbance: Secondary | ICD-10-CM | POA: Diagnosis not present

## 2016-05-16 DIAGNOSIS — H401132 Primary open-angle glaucoma, bilateral, moderate stage: Secondary | ICD-10-CM | POA: Diagnosis not present

## 2016-05-16 DIAGNOSIS — I639 Cerebral infarction, unspecified: Secondary | ICD-10-CM | POA: Diagnosis not present

## 2016-05-25 ENCOUNTER — Encounter (INDEPENDENT_AMBULATORY_CARE_PROVIDER_SITE_OTHER): Payer: Self-pay

## 2016-05-25 ENCOUNTER — Ambulatory Visit (INDEPENDENT_AMBULATORY_CARE_PROVIDER_SITE_OTHER): Payer: Medicare Other | Admitting: *Deleted

## 2016-05-25 DIAGNOSIS — Z7901 Long term (current) use of anticoagulants: Secondary | ICD-10-CM

## 2016-05-25 DIAGNOSIS — I6350 Cerebral infarction due to unspecified occlusion or stenosis of unspecified cerebral artery: Secondary | ICD-10-CM

## 2016-05-25 DIAGNOSIS — I4891 Unspecified atrial fibrillation: Secondary | ICD-10-CM

## 2016-05-25 DIAGNOSIS — I482 Chronic atrial fibrillation, unspecified: Secondary | ICD-10-CM

## 2016-05-25 DIAGNOSIS — Z5181 Encounter for therapeutic drug level monitoring: Secondary | ICD-10-CM | POA: Diagnosis not present

## 2016-05-25 LAB — POCT INR: INR: 1.8

## 2016-06-08 ENCOUNTER — Ambulatory Visit (INDEPENDENT_AMBULATORY_CARE_PROVIDER_SITE_OTHER): Payer: Medicare Other | Admitting: Pharmacist

## 2016-06-08 DIAGNOSIS — Z7901 Long term (current) use of anticoagulants: Secondary | ICD-10-CM | POA: Diagnosis not present

## 2016-06-08 DIAGNOSIS — Z5181 Encounter for therapeutic drug level monitoring: Secondary | ICD-10-CM | POA: Diagnosis not present

## 2016-06-08 DIAGNOSIS — I6350 Cerebral infarction due to unspecified occlusion or stenosis of unspecified cerebral artery: Secondary | ICD-10-CM

## 2016-06-08 DIAGNOSIS — I482 Chronic atrial fibrillation, unspecified: Secondary | ICD-10-CM

## 2016-06-08 DIAGNOSIS — I4891 Unspecified atrial fibrillation: Secondary | ICD-10-CM

## 2016-06-08 LAB — POCT INR: INR: 2.2

## 2016-06-23 ENCOUNTER — Other Ambulatory Visit: Payer: Self-pay | Admitting: Cardiology

## 2016-06-25 DIAGNOSIS — Z23 Encounter for immunization: Secondary | ICD-10-CM | POA: Diagnosis not present

## 2016-06-27 ENCOUNTER — Other Ambulatory Visit: Payer: Self-pay | Admitting: Cardiology

## 2016-07-06 ENCOUNTER — Ambulatory Visit (INDEPENDENT_AMBULATORY_CARE_PROVIDER_SITE_OTHER): Payer: Medicare Other | Admitting: *Deleted

## 2016-07-06 DIAGNOSIS — Z7901 Long term (current) use of anticoagulants: Secondary | ICD-10-CM | POA: Diagnosis not present

## 2016-07-06 DIAGNOSIS — I4891 Unspecified atrial fibrillation: Secondary | ICD-10-CM | POA: Diagnosis not present

## 2016-07-06 DIAGNOSIS — I482 Chronic atrial fibrillation, unspecified: Secondary | ICD-10-CM

## 2016-07-06 DIAGNOSIS — Z5181 Encounter for therapeutic drug level monitoring: Secondary | ICD-10-CM | POA: Diagnosis not present

## 2016-07-06 DIAGNOSIS — I6350 Cerebral infarction due to unspecified occlusion or stenosis of unspecified cerebral artery: Secondary | ICD-10-CM

## 2016-07-06 LAB — POCT INR: INR: 2.4

## 2016-07-22 ENCOUNTER — Other Ambulatory Visit: Payer: Self-pay | Admitting: Cardiology

## 2016-07-22 DIAGNOSIS — R9439 Abnormal result of other cardiovascular function study: Secondary | ICD-10-CM

## 2016-07-22 DIAGNOSIS — I2510 Atherosclerotic heart disease of native coronary artery without angina pectoris: Secondary | ICD-10-CM

## 2016-07-22 DIAGNOSIS — I10 Essential (primary) hypertension: Secondary | ICD-10-CM

## 2016-07-22 DIAGNOSIS — I5023 Acute on chronic systolic (congestive) heart failure: Secondary | ICD-10-CM

## 2016-07-22 DIAGNOSIS — I2583 Coronary atherosclerosis due to lipid rich plaque: Secondary | ICD-10-CM

## 2016-07-22 DIAGNOSIS — Z01812 Encounter for preprocedural laboratory examination: Secondary | ICD-10-CM

## 2016-07-22 DIAGNOSIS — I209 Angina pectoris, unspecified: Secondary | ICD-10-CM

## 2016-07-22 DIAGNOSIS — I4821 Permanent atrial fibrillation: Secondary | ICD-10-CM

## 2016-07-27 ENCOUNTER — Ambulatory Visit (INDEPENDENT_AMBULATORY_CARE_PROVIDER_SITE_OTHER): Payer: Medicare Other | Admitting: Physician Assistant

## 2016-07-27 ENCOUNTER — Encounter: Payer: Self-pay | Admitting: Physician Assistant

## 2016-07-27 ENCOUNTER — Encounter (INDEPENDENT_AMBULATORY_CARE_PROVIDER_SITE_OTHER): Payer: Self-pay

## 2016-07-27 ENCOUNTER — Telehealth: Payer: Self-pay | Admitting: Emergency Medicine

## 2016-07-27 VITALS — BP 128/68 | HR 63 | Ht 69.0 in | Wt 206.0 lb

## 2016-07-27 DIAGNOSIS — Z7901 Long term (current) use of anticoagulants: Secondary | ICD-10-CM

## 2016-07-27 DIAGNOSIS — Z800 Family history of malignant neoplasm of digestive organs: Secondary | ICD-10-CM

## 2016-07-27 DIAGNOSIS — K649 Unspecified hemorrhoids: Secondary | ICD-10-CM | POA: Diagnosis not present

## 2016-07-27 DIAGNOSIS — Z1211 Encounter for screening for malignant neoplasm of colon: Secondary | ICD-10-CM

## 2016-07-27 DIAGNOSIS — Z1212 Encounter for screening for malignant neoplasm of rectum: Secondary | ICD-10-CM

## 2016-07-27 MED ORDER — NA SULFATE-K SULFATE-MG SULF 17.5-3.13-1.6 GM/177ML PO SOLN: 1.0000 | ORAL | 0 refills | Status: DC

## 2016-07-27 MED ORDER — HYDROCORTISONE 2.5 % RE CREA: 1.0000 "application " | TOPICAL_CREAM | Freq: Two times a day (BID) | RECTAL | 1 refills | Status: DC | PRN

## 2016-07-27 NOTE — Patient Instructions (Addendum)
You have been scheduled for a colonoscopy. Please follow written instructions given to you at your visit today.  Please pick up your prep supplies at the pharmacy within the next 1-3 days. If you use inhalers (even only as needed), please bring them with you on the day of your procedure. Your physician has requested that you go to www.startemmi.com and enter the access code given to you at your visit today. This web site gives a general overview about your procedure. However, you should still follow specific instructions given to you by our office regarding your preparation for the procedure.  We have sent the following medications to your pharmacy for you to pick up at your convenience: Hydrocortisone Anusol Cream.

## 2016-07-27 NOTE — Telephone Encounter (Signed)
Vincent [name], would you help with the coumadin management? Thank you, K

## 2016-07-27 NOTE — Telephone Encounter (Signed)
07/27/2016   RE: Vincent [name] DOB: [birth year] MRN: [id number]   Dear Dr. Meda Coffee,    We have scheduled the above patient for an endoscopic procedure. Our records show that he is on anticoagulation therapy.   Please advise as to how long the patient may come off his therapy of Coumadin prior to the procedure, which is scheduled for 08/12/16. Also please advise a Lovenox bridge if needed.  Please fax back/ or route the completed form to [employer].   Sincerely,    Tinnie Gens, [place]

## 2016-07-27 NOTE — Progress Notes (Addendum)
Chief Complaint: Screening colorectal cancer, family history of colon cancer, chronic anticoagulation, hemorrhoids  HPI:   Vincent [name] is a 80 year old African American male with past medical history of A. fib, CAD, GERD, hyperlipidemia, hypertension and stroke, who previously followed in our clinic with Dr. Deatra Ina for his screening colonoscopies, who presents to clinic today with a chief complaint of need for a screening colonoscopy, family history of colon cancer, on chronic anticoagulation and hemorrhoids.    According to our records patient's last colonoscopy was performed 10/07/10 by Dr. Deatra Ina, review of that report shows 1 cm pedunculated polyp in the cecum, 3 mm sessile polyp, 10 mm sessile polyp, 8 mm sessile polyp and mild diverticulosis from the sigmoid to descending colon. It was recommend patient have repeat colonoscopy in 5 years due to his family history of colon cancer.    Patient's last echo was performed 03/18/14 and revealed a left ventricular ejection fraction of 40-45%.    Today, the patient tells me that a couple of years ago he was seen in our office for rectal bleeding. At that time he was given hydrocortisone cream for his hemorrhoids. He tells me that he is not having bleeding currently but is having some itching and discomfort. This started a couple of weeks ago. This cream seemed to help these symptoms in the past and he would like a refill.   Patient also tells me that he is due for screening colonoscopy. He describes that he is on Coumadin for A. fib as well as aspirin for a history of CAD. He does follow with Dr. Meda Coffee regarding this and has been doing well recently. He tells me he has been taken off of his Coumadin before and has done fine.   Patient's family history is significant for a history of colon cancer diagnosed in his mother in her 50s.   Patient denies fever, chills, blood in his stool, melena, change in bowel habits, weight loss, fatigue, anorexia, nausea,  vomiting, heartburn, reflux or abdominal pain.  Past Medical History:  Diagnosis Date  . Alcoholic cardiomyopathy ([place])   . Atrial fibrillation ([place])   . Bradycardia   . 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 Coronary artery disease   . Foot drop, left 03/11/2015  . Gait disorder 03/11/2015  . GERD (gastroesophageal reflux disease)   . Gout   . 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 Hyperlipidemia   . Hypertension   . Long term (current) use of anticoagulants   . 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 Pulmonary eosinophilia ([place])   . Stroke ([place])   . Syncope and collapse   . Unspecified glaucoma(365.9)   . V-tach [facility])     Past Surgical History:  Procedure Laterality Date  . CARDIAC CATHETERIZATION N/A 07/30/2015   Procedure: Left Heart Cath and Coronary Angiography;  Surgeon: Belva Crome, MD;  Location: [facility];  Service: Cardiovascular;  Laterality: N/A;  . KNEE SURGERY    . loop recorder  09/2007    Current Outpatient Prescriptions  Medication Sig Dispense Refill  . aspirin 81 MG chewable tablet Chew 81 mg by mouth daily.    Marland Kitchen atorvastatin (LIPITOR) 40 MG tablet TAKE 1 TABLET BY MOUTH EVERY DAY 90 tablet 3  . brimonidine (ALPHAGAN P) 0.1 % SOLN Place 1 drop into both eyes 2 (two) times daily.     . carvedilol (COREG) 6.25 MG tablet  Take 1 tablet (6.25 mg total) by mouth 2 (two) times daily. 180 tablet 6  . colchicine 0.6 MG tablet Take 0.6 mg by mouth daily.     . famotidine (PEPCID) 20 MG tablet Take 20 mg by mouth 2 (two) times daily.    . ferrous sulfate 325 (65 FE) MG tablet Take 325 mg by mouth daily with breakfast.    . fluocinonide cream (LIDEX) [id number] % Apply 1 application topically daily as needed (itching).   2  . furosemide (LASIX) 20 MG tablet Take 1 tablet (20 mg total) by mouth daily. 30 tablet 3  . gabapentin (NEURONTIN) 300 MG capsule Take 300 mg by mouth 2 (two) times  daily.    . hydrocortisone (ANUSOL-HC) 2.5 % rectal cream Place 1 application rectally 2 (two) times daily as needed for hemorrhoids or itching.    . hydrocortisone cream 1 % Apply 1 application topically 2 (two) times daily as needed (for eczmea).    Marland Kitchen ipratropium (ATROVENT) 0.03 % nasal spray Place 2 sprays into both nostrils every 12 (twelve) hours. 30 mL 12  . irbesartan (AVAPRO) 300 MG tablet TAKE 1 TABLET(300 MG) BY MOUTH AT BEDTIME 90 tablet 2  . isosorbide mononitrate (IMDUR) 30 MG 24 hr tablet Take 1 tablet (30 mg total) by mouth daily. 90 tablet 3  . latanoprost (XALATAN) 0.005 % ophthalmic solution Place 1 drop into both eyes at bedtime.    Marland Kitchen loratadine (CLARITIN) 10 MG tablet Take 10 mg by mouth daily.    . meclizine (ANTIVERT) 25 MG tablet Take 25 mg by mouth 3 (three) times daily as needed for dizziness.    . nitroGLYCERIN (NITROLINGUAL) 0.4 MG/SPRAY spray Place 1 spray under the tongue every 5 (five) minutes x 3 doses as needed for chest pain.    . Omega-3 Fatty Acids (FISH OIL) 1000 MG CAPS Take 1,000 mg by mouth 2 (two) times daily.     . predniSONE (STERAPRED UNI-PAK 48 TAB) 5 MG (48) TBPK tablet Take 1 tablet (5 mg total) by mouth daily. 48 tablet 0  . pseudoephedrine (SUDAFED) 60 MG tablet Take 60 mg by mouth every 4 (four) hours as needed for congestion.    Marland Kitchen spironolactone (ALDACTONE) 25 MG tablet Take 1 tablet (25 mg total) by mouth daily. 90 tablet 6  . Tamsulosin HCl (FLOMAX) 0.4 MG CAPS Take 0.4 mg by mouth daily.    Marland Kitchen triamcinolone cream (KENALOG) 0.1 % Apply 1 application topically 2 (two) times daily. Eczema  3  . warfarin (COUMADIN) 2 MG tablet TAKE AS DIRECTED BY COUMADIN CLINIC 150 tablet 1   No current facility-administered medications for this visit.     Allergies as of 07/27/2016  . (No Known Allergies)    Family History  Problem Relation Age of Onset  . Colon cancer Mother   . Hypertension Mother   . Cancer Mother   . Prostate cancer Brother   .  Cancer Brother   . Heart disease Father   . Heart attack Father   . Hypertension Father   . Heart attack Sister   . Cancer Sister   . Stroke Neg Hx     Social History   Social History  . Marital status: Divorced    Spouse name: N/A  . Number of children: 0  . Years of education: master's   Occupational History  . semi-retired    Social History Main Topics  . Smoking status: Former Smoker    Quit date: 08/22/1983  .  Smokeless tobacco: Never Used  . Alcohol use No  . Drug use: No  . Sexual activity: Not on file   Other Topics Concern  . Not on file   Social History Narrative   Patient drinks 1-2 cups of caffeine daily.   Patient is right handed.    Review of Systems:     Constitutional: No weight loss, fever, chills, weakness or fatigue Cardiovascular: No chest pain Respiratory: No SOB  Gastrointestinal: See HPI and otherwise negative   Physical Exam:  Vital signs: BP 128/68   Pulse 63   Ht 5\' 9"  (1.753 m)   Wt 206 lb (93.4 kg)   SpO2 98%   BMI 30.42 kg/m   Constitutional:   Pleasant African American male appears to be in NAD, Well developed, Well nourished, alert and cooperative  Respiratory: Respirations even and unlabored. Lungs clear to auscultation bilaterally.   No wheezes, crackles, or rhonchi.  Cardiovascular: Normal S1, S2. No MRG. Regular rate and rhythm. No peripheral edema, cyanosis or pallor.  Gastrointestinal:  Soft, nondistended, nontender. No rebound or guarding. Normal bowel sounds. No appreciable masses or hepatomegaly. Psychiatric: . Demonstrates good judgement and reason without abnormal affect or behaviors.  MOST RECENT LABS: CBC    Component Value Date/Time   WBC 3.7 (L) 04/27/2016 1208   RBC 4.80 04/27/2016 1208   HGB 12.9 (L) 04/27/2016 1208   HCT 39.2 04/27/2016 1208   PLT 214 04/27/2016 1208   MCV 81.7 04/27/2016 1208   MCH 26.9 (L) 04/27/2016 1208   MCHC 32.9 04/27/2016 1208   RDW 15.0 04/27/2016 1208   LYMPHSABS 1,036  04/27/2016 1208   MONOABS 444 04/27/2016 1208   EOSABS 185 04/27/2016 1208   BASOSABS 37 04/27/2016 1208    CMP     Component Value Date/Time   NA 138 04/27/2016 1208   K 4.2 04/27/2016 1208   CL 100 04/27/2016 1208   CO2 27 04/27/2016 1208   GLUCOSE 188 (H) 04/27/2016 1208   BUN 9 04/27/2016 1208   CREATININE 0.98 04/27/2016 1208   CALCIUM 9.0 04/27/2016 1208   PROT 7.1 04/27/2016 1208   ALBUMIN 4.0 04/27/2016 1208   AST 20 04/27/2016 1208   ALT 19 04/27/2016 1208   ALKPHOS 87 04/27/2016 1208   BILITOT 0.8 04/27/2016 1208   GFRNONAA >60 03/05/2015 0939   GFRAA >60 03/05/2015 0939    Assessment: 1. Preprocedural examination: Prior to screening colonoscopy due to chronic anticoagulation 2. Screening colorectal cancer: Patient's last colonoscopy was 5 years ago with finding of multiple polyps, he also has a family history of colon cancer and repeat was recommended in 5 years, he is due now 3. Chronic and acute relation: With Coumadin, patient will need to hold this for at least 4 days, will discuss with his cardiologist in regards to need for bridging therapy 4. Hemorrhoids: Patient was diagnosed a couple of years ago when he had rectal bleeding, now he just has itching and requests more hydrocortisone cream  Plan: 1. Refill hydrocortisone cream to be used twice a day , 7 days at a time 2. Patient was scheduled for a colonoscopy with Dr. Loletha Carrow as he is the supervising physician this morning, in the [facility]. Risks, benefits, limitations and alternatives were discussed and the patient agrees to proceed. 3. Patient was advised to hold his Coumadin for 4 days before time of his colonoscopy. We will contact his cardiologist Dr. Meda Coffee to ensure that holding his Coumadin is safe. Respect their recommendations in  regards to need for bridging therapy. 4. Patient to follow in clinic per Dr. Corena Pilgrim recommendations in the future.  Ellouise Newer, PA-C [facility] 07/27/2016,  11:06 AM  Cc: No ref. provider found   Thank you for sending this case to me and discussing patient with me in clinic today. I have reviewed the entire note, and the outlined plan seems appropriate.

## 2016-07-28 NOTE — Telephone Encounter (Signed)
Yes, pt has an appt with [place] in Coumadin clinic on 12/6 so we will coordinate Lovenox bridging instructions with pt at that time.

## 2016-07-28 NOTE — Telephone Encounter (Signed)
Patient informed to keep his appointment for instructions. He verbalized understanding.

## 2016-07-28 NOTE — Telephone Encounter (Signed)
Pt will need a Lovenox bridge. He takes Coumadin for afib with CHADS2 score of 5 (HTN, HF, age, and prior stroke). He has used Lovenox in the past for prior procedures. Will coordinate in Coumadin clinic. Clearance routed to [employer].

## 2016-07-28 NOTE — Telephone Encounter (Signed)
Thank you for your help! So Coumadin clinic will contact patient with instructions?

## 2016-07-29 ENCOUNTER — Other Ambulatory Visit: Payer: Self-pay | Admitting: Cardiology

## 2016-07-29 ENCOUNTER — Encounter: Payer: Self-pay | Admitting: Gastroenterology

## 2016-08-02 ENCOUNTER — Other Ambulatory Visit: Payer: Self-pay | Admitting: Cardiology

## 2016-08-02 MED ORDER — ATORVASTATIN CALCIUM 40 MG PO TABS: 40.0000 mg | ORAL_TABLET | Freq: Every day | ORAL | 2 refills | Status: DC

## 2016-08-05 ENCOUNTER — Other Ambulatory Visit: Payer: Self-pay | Admitting: Cardiology

## 2016-08-05 ENCOUNTER — Ambulatory Visit (INDEPENDENT_AMBULATORY_CARE_PROVIDER_SITE_OTHER): Payer: Medicare Other

## 2016-08-05 ENCOUNTER — Telehealth: Payer: Self-pay | Admitting: Gastroenterology

## 2016-08-05 DIAGNOSIS — I4821 Permanent atrial fibrillation: Secondary | ICD-10-CM

## 2016-08-05 DIAGNOSIS — Z01812 Encounter for preprocedural laboratory examination: Secondary | ICD-10-CM

## 2016-08-05 DIAGNOSIS — R9439 Abnormal result of other cardiovascular function study: Secondary | ICD-10-CM

## 2016-08-05 DIAGNOSIS — Z5181 Encounter for therapeutic drug level monitoring: Secondary | ICD-10-CM | POA: Diagnosis not present

## 2016-08-05 DIAGNOSIS — I5023 Acute on chronic systolic (congestive) heart failure: Secondary | ICD-10-CM

## 2016-08-05 DIAGNOSIS — I6350 Cerebral infarction due to unspecified occlusion or stenosis of unspecified cerebral artery: Secondary | ICD-10-CM

## 2016-08-05 DIAGNOSIS — I482 Chronic atrial fibrillation, unspecified: Secondary | ICD-10-CM

## 2016-08-05 DIAGNOSIS — I10 Essential (primary) hypertension: Secondary | ICD-10-CM

## 2016-08-05 DIAGNOSIS — I2583 Coronary atherosclerosis due to lipid rich plaque: Secondary | ICD-10-CM

## 2016-08-05 DIAGNOSIS — I209 Angina pectoris, unspecified: Secondary | ICD-10-CM

## 2016-08-05 DIAGNOSIS — I2510 Atherosclerotic heart disease of native coronary artery without angina pectoris: Secondary | ICD-10-CM

## 2016-08-05 LAB — POCT INR: INR: 2.2

## 2016-08-05 MED ORDER — ENOXAPARIN SODIUM 100 MG/ML ~~LOC~~ SOLN: 100.0000 mg | Freq: Two times a day (BID) | SUBCUTANEOUS | 0 refills | Status: DC

## 2016-08-05 NOTE — Patient Instructions (Signed)
08/06/16: Last dose of Coumadin.  08/07/16: No Coumadin or Lovenox.  08/08/16: Inject Lovenox 100mg  in the fatty abdominal tissue at least 2 inches from the belly button twice a day about 12 hours apart, 8am and 8pm rotate sites. No Coumadin.  08/09/16: Inject Lovenox in the fatty tissue every 12 hours, 8am and 8pm. No Coumadin.  08/10/16: Inject Lovenox in the fatty tissue every 12 hours, 8am and 8pm. No Coumadin.  08/11/16: Inject Lovenox in the fatty tissue in the morning at 8 am (No PM dose). No Coumadin.  08/12/16: Procedure Day - No Lovenox - Resume Coumadin in the evening or as directed by doctor (take an extra half tablet with usual dose for 2 days then resume normal dose).  08/13/16: Resume Lovenox inject in the fatty tissue every 12 hours and take Coumadin.  08/14/16: Inject Lovenox in the fatty tissue every 12 hours and take Coumadin.  08/15/16: Inject Lovenox in the fatty tissue every 12 hours and take Coumadin.  08/16/16: Inject Lovenox in the fatty tissue every 12 hours and take Coumadin.  08/17/16: Inject Lovenox in the fatty tissue every 12 hours and take Coumadin.  08/18/16: Coumadin appt to check INR

## 2016-08-08 NOTE — Telephone Encounter (Signed)
A pay no more than 50 dollars coupon faxed to [facility].

## 2016-08-12 ENCOUNTER — Ambulatory Visit (AMBULATORY_SURGERY_CENTER): Payer: Medicare Other | Admitting: Gastroenterology

## 2016-08-12 ENCOUNTER — Encounter: Payer: Self-pay | Admitting: Gastroenterology

## 2016-08-12 VITALS — BP 121/74 | HR 74 | Temp 95.3°F | Resp 10 | Ht 69.0 in | Wt 206.0 lb

## 2016-08-12 DIAGNOSIS — D123 Benign neoplasm of transverse colon: Secondary | ICD-10-CM | POA: Diagnosis not present

## 2016-08-12 DIAGNOSIS — D120 Benign neoplasm of cecum: Secondary | ICD-10-CM

## 2016-08-12 DIAGNOSIS — Z1211 Encounter for screening for malignant neoplasm of colon: Secondary | ICD-10-CM | POA: Diagnosis not present

## 2016-08-12 DIAGNOSIS — D122 Benign neoplasm of ascending colon: Secondary | ICD-10-CM

## 2016-08-12 DIAGNOSIS — Z86010 Personal history of colonic polyps: Secondary | ICD-10-CM | POA: Diagnosis not present

## 2016-08-12 MED ORDER — SODIUM CHLORIDE 0.9 % IV SOLN: 500.0000 mL | INTRAVENOUS | Status: DC

## 2016-08-12 NOTE — Op Note (Signed)
[place] Patient Name: Vincent [name] Procedure Date: 08/12/2016 3:04 PM MRN: [id number] Endoscopist: Mallie Mussel L. Loletha Carrow , MD Age: 80 Referring MD:  Date of Birth: 09/04/1935 Gender: Male Account #: [id number] Procedure:                Colonoscopy Indications:              High risk colon cancer surveillance: Personal                            history of multiple (3 or more, each < 1cm)                            adenomas in 2012 Medicines:                Monitored Anesthesia Care Procedure:                Pre-Anesthesia Assessment:                           - Prior to the procedure, a History and Physical                            was performed, and patient medications and                            allergies were reviewed. The patient's tolerance of                            previous anesthesia was also reviewed. The risks                            and benefits of the procedure and the sedation                            options and risks were discussed with the patient.                            All questions were answered, and informed consent                            was obtained. Prior Anticoagulants: The patient has                            taken Coumadin (warfarin), last dose was 4 days                            prior to procedure. ASA Grade Assessment: III - A                            patient with severe systemic disease. After                            reviewing the risks and benefits, the patient was  deemed in satisfactory condition to undergo the                            procedure.                           - Prior to the procedure, a History and Physical                            was performed, and patient medications and                            allergies were reviewed. The patient's tolerance of                            previous anesthesia was also reviewed. The risks                            and benefits of the  procedure and the sedation                            options and risks were discussed with the patient.                            All questions were answered, and informed consent                            was obtained. Prior Anticoagulants: The patient has                            taken Lovenox (enoxaparin), last dose was 1 day                            prior to procedure. After reviewing the risks and                            benefits, the patient was deemed in satisfactory                            condition to undergo the procedure.                           After obtaining informed consent, the colonoscope                            was passed under direct vision. Throughout the                            procedure, the patient's blood pressure, pulse, and                            oxygen saturations were monitored continuously. The  Model CF-H180AL [id number]) scope was introduced                            through the anus and advanced to the the cecum,                            identified by appendiceal orifice and ileocecal                            valve. The colonoscopy was performed without                            difficulty. The patient tolerated the procedure                            well. The quality of the bowel preparation was                            excellent. The ileocecal valve, appendiceal                            orifice, and rectum were photographed. The quality                            of the bowel preparation was evaluated using the                            BBPS [facility] Bowel Preparation Scale) with scores                            of: Right Colon = 3, Transverse Colon = 3 and Left                            Colon = 3 (entire mucosa seen well with no residual                            staining, small fragments of stool or opaque                            liquid). The total BBPS score equals 9. The bowel                             preparation used was SUPREP. Scope In: 3:12:12 PM Scope Out: 3:29:39 PM Scope Withdrawal Time: 0 hours 11 minutes 21 seconds  Total Procedure Duration: 0 hours 17 minutes 27 seconds  Findings:                 The digital rectal exam findings include decreased                            sphincter tone, non-thrombosed internal hemorrhoids                            and  internal hemorrhoids that prolapse with                            straining, but spontaneously regress to the resting                            position (Grade II).                           A 4 mm polyp was found in the hepatic flexure. The                            polyp was sessile. The polyp was removed with a                            cold snare. Resection and retrieval were complete.                           Five sessile polyps were found in the proximal                            transverse colon, ascending colon and cecum. The                            polyps were 2 mm in size. These polyps were removed                            with a cold biopsy forceps. Resection and retrieval                            were complete.                           Multiple diverticula were found in the left colon.                           Internal hemorrhoids were found during retroflexion                            and during anoscopy. The hemorrhoids were large and                            Grade II (internal hemorrhoids that prolapse but                            reduce spontaneously).                           The exam was otherwise without abnormality. Complications:            No immediate complications. Estimated Blood Loss:     Estimated blood loss: none. Impression:               - Decreased sphincter tone, non-thrombosed internal  hemorrhoids and internal hemorrhoids that prolapse                            with straining, but spontaneously regress to the                             resting position (Grade II) found on digital rectal                            exam.                           - One 4 mm polyp at the hepatic flexure, removed                            with a cold snare. Resected and retrieved.                           - Five 2 mm polyps in the proximal transverse                            colon, in the ascending colon and in the cecum,                            removed with a cold biopsy forceps. Resected and                            retrieved.                           - Diverticulosis in the left colon.                           - Internal hemorrhoids.                           - The examination was otherwise normal. Recommendation:           - Patient has a contact number available for                            emergencies. The signs and symptoms of potential                            delayed complications were discussed with the                            patient. Return to normal activities tomorrow.                            Written discharge instructions were provided to the                            patient.                           -  Resume previous diet.                           - Resume Coumadin (warfarin) tomorrow and Lovenox                            (enoxaparin) tomorrow at prior doses.                           - Await pathology results.                           - No repeat colonoscopy due to age. Salvatrice Morandi L. Loletha Carrow, MD 08/12/2016 3:39:26 PM This report has been signed electronically.

## 2016-08-12 NOTE — Progress Notes (Signed)
A and O x3. Report to RN. Tolerated MAC anesthesia well. 

## 2016-08-12 NOTE — Patient Instructions (Signed)
Impression/recommendations:  Polyps (handout given) Diverticulosis (handout given) High Fiber diet (handout given) Hemorrhoids (handout given)  Resume Coumadin and Lovenox tomorrow at prior doses.  YOU HAD AN ENDOSCOPIC PROCEDURE TODAY AT [facility]:   Refer to the procedure report that was given to you for any specific questions about what was found during the examination.  If the procedure report does not answer your questions, please call your gastroenterologist to clarify.  If you requested that your care partner not be given the details of your procedure findings, then the procedure report has been included in a sealed envelope for you to review at your convenience later.  YOU SHOULD EXPECT: Some feelings of bloating in the abdomen. Passage of more gas than usual.  Walking can help get rid of the air that was put into your GI tract during the procedure and reduce the bloating. If you had a lower endoscopy (such as a colonoscopy or flexible sigmoidoscopy) you may notice spotting of blood in your stool or on the toilet paper. If you underwent a bowel prep for your procedure, you may not have a normal bowel movement for a few days.  Please Note:  You might notice some irritation and congestion in your nose or some drainage.  This is from the oxygen used during your procedure.  There is no need for concern and it should clear up in a day or so.  SYMPTOMS TO REPORT IMMEDIATELY:   Following lower endoscopy (colonoscopy or flexible sigmoidoscopy):  Excessive amounts of blood in the stool  Significant tenderness or worsening of abdominal pains  Swelling of the abdomen that is new, acute  Fever of 100F or higher   For urgent or emergent issues, a gastroenterologist can be reached at any hour by calling [phone].   DIET:  We do recommend a small meal at first, but then you may proceed to your regular diet.  Drink plenty of fluids but you should avoid alcoholic  beverages for 24 hours.  ACTIVITY:  You should plan to take it easy for the rest of today and you should NOT DRIVE or use heavy machinery until tomorrow (because of the sedation medicines used during the test).    FOLLOW UP: Our staff will call the number listed on your records the next business day following your procedure to check on you and address any questions or concerns that you may have regarding the information given to you following your procedure. If we do not reach you, we will leave a message.  However, if you are feeling well and you are not experiencing any problems, there is no need to return our call.  We will assume that you have returned to your regular daily activities without incident.  If any biopsies were taken you will be contacted by phone or by letter within the next 1-3 weeks.  Please call us at [phone] if you have not heard about the biopsies in 3 weeks.    SIGNATURES/CONFIDENTIALITY: You and/or your care partner have signed paperwork which will be entered into your electronic medical record.  These signatures attest to the fact that that the information above on your After Visit Summary has been reviewed and is understood.  Full responsibility of the confidentiality of this discharge information lies with you and/or your care-partner.

## 2016-08-15 ENCOUNTER — Telehealth: Payer: Self-pay

## 2016-08-15 NOTE — Telephone Encounter (Signed)
  Follow up Call-  Call back number 08/12/2016  Post procedure Call Back phone  # [phone]  Permission to leave phone message Yes  Some recent data might be hidden     Patient questions:  Do you have a fever, pain , or abdominal swelling? No. Pain Score  0 *  Have you tolerated food without any problems? Yes.    Have you been able to return to your normal activities? Yes.    Do you have any questions about your discharge instructions: Diet   No. Medications  No. Follow up visit  No.  Do you have questions or concerns about your Care? No.  Actions: * If pain score is 4 or above: No action needed, pain <4.

## 2016-08-18 ENCOUNTER — Telehealth: Payer: Self-pay | Admitting: Cardiology

## 2016-08-18 ENCOUNTER — Ambulatory Visit (INDEPENDENT_AMBULATORY_CARE_PROVIDER_SITE_OTHER): Payer: Medicare Other | Admitting: *Deleted

## 2016-08-18 DIAGNOSIS — I6350 Cerebral infarction due to unspecified occlusion or stenosis of unspecified cerebral artery: Secondary | ICD-10-CM | POA: Diagnosis not present

## 2016-08-18 DIAGNOSIS — Z5181 Encounter for therapeutic drug level monitoring: Secondary | ICD-10-CM | POA: Diagnosis not present

## 2016-08-18 DIAGNOSIS — I482 Chronic atrial fibrillation, unspecified: Secondary | ICD-10-CM

## 2016-08-18 LAB — POCT INR: INR: 1.3

## 2016-08-18 NOTE — Telephone Encounter (Signed)
Walk in Pt Form -Disabilty Parking Pla-Card needs completed-placed in Dr.Nelson box

## 2016-08-18 NOTE — Patient Instructions (Addendum)
08/18/16- Today take 6mg s on Coumadin, Lovenox 100mg   given in office, do an injection tonight at  9PM  08/19/16- Tomorrow take 4mg s on Coumadin, resume Lovenox 100mg  injections every 12 hours at 9am and 9pm  08/20/16- Resume normal dose of Coumadin and Continue Lovenox 100mg s injections every 12 hours at 9am and 9pm until Coumadin Clinic appt on 08/23/16.

## 2016-08-23 ENCOUNTER — Encounter: Payer: Self-pay | Admitting: Gastroenterology

## 2016-08-23 ENCOUNTER — Ambulatory Visit (INDEPENDENT_AMBULATORY_CARE_PROVIDER_SITE_OTHER): Payer: Medicare Other | Admitting: *Deleted

## 2016-08-23 DIAGNOSIS — I6350 Cerebral infarction due to unspecified occlusion or stenosis of unspecified cerebral artery: Secondary | ICD-10-CM | POA: Diagnosis not present

## 2016-08-23 DIAGNOSIS — Z5181 Encounter for therapeutic drug level monitoring: Secondary | ICD-10-CM

## 2016-08-23 DIAGNOSIS — I482 Chronic atrial fibrillation, unspecified: Secondary | ICD-10-CM

## 2016-08-23 LAB — POCT INR: INR: 1.5

## 2016-08-23 MED ORDER — ENOXAPARIN SODIUM 100 MG/ML ~~LOC~~ SOLN: 100.0000 mg | Freq: Two times a day (BID) | SUBCUTANEOUS | 0 refills | Status: DC

## 2016-08-26 ENCOUNTER — Ambulatory Visit (INDEPENDENT_AMBULATORY_CARE_PROVIDER_SITE_OTHER): Payer: Medicare Other

## 2016-08-26 DIAGNOSIS — Z5181 Encounter for therapeutic drug level monitoring: Secondary | ICD-10-CM

## 2016-08-26 DIAGNOSIS — I6350 Cerebral infarction due to unspecified occlusion or stenosis of unspecified cerebral artery: Secondary | ICD-10-CM

## 2016-08-26 DIAGNOSIS — I482 Chronic atrial fibrillation, unspecified: Secondary | ICD-10-CM

## 2016-08-26 LAB — POCT INR: INR: 2.1

## 2016-09-16 ENCOUNTER — Ambulatory Visit (INDEPENDENT_AMBULATORY_CARE_PROVIDER_SITE_OTHER): Payer: Medicare Other | Admitting: *Deleted

## 2016-09-16 DIAGNOSIS — I482 Chronic atrial fibrillation, unspecified: Secondary | ICD-10-CM

## 2016-09-16 DIAGNOSIS — Z5181 Encounter for therapeutic drug level monitoring: Secondary | ICD-10-CM

## 2016-09-16 DIAGNOSIS — I6350 Cerebral infarction due to unspecified occlusion or stenosis of unspecified cerebral artery: Secondary | ICD-10-CM

## 2016-09-16 LAB — POCT INR: INR: 1.7

## 2016-09-28 ENCOUNTER — Ambulatory Visit (INDEPENDENT_AMBULATORY_CARE_PROVIDER_SITE_OTHER): Payer: Medicare Other | Admitting: *Deleted

## 2016-09-28 ENCOUNTER — Encounter (INDEPENDENT_AMBULATORY_CARE_PROVIDER_SITE_OTHER): Payer: Self-pay

## 2016-09-28 DIAGNOSIS — I6350 Cerebral infarction due to unspecified occlusion or stenosis of unspecified cerebral artery: Secondary | ICD-10-CM

## 2016-09-28 DIAGNOSIS — I482 Chronic atrial fibrillation, unspecified: Secondary | ICD-10-CM

## 2016-09-28 DIAGNOSIS — Z5181 Encounter for therapeutic drug level monitoring: Secondary | ICD-10-CM | POA: Diagnosis not present

## 2016-09-28 LAB — POCT INR: INR: 1.6

## 2016-10-03 DIAGNOSIS — J018 Other acute sinusitis: Secondary | ICD-10-CM | POA: Diagnosis not present

## 2016-10-12 ENCOUNTER — Ambulatory Visit (INDEPENDENT_AMBULATORY_CARE_PROVIDER_SITE_OTHER): Payer: Medicare Other | Admitting: *Deleted

## 2016-10-12 DIAGNOSIS — I6350 Cerebral infarction due to unspecified occlusion or stenosis of unspecified cerebral artery: Secondary | ICD-10-CM

## 2016-10-12 DIAGNOSIS — I482 Chronic atrial fibrillation, unspecified: Secondary | ICD-10-CM

## 2016-10-12 DIAGNOSIS — Z5181 Encounter for therapeutic drug level monitoring: Secondary | ICD-10-CM | POA: Diagnosis not present

## 2016-10-12 LAB — POCT INR: INR: 2.1

## 2016-10-18 ENCOUNTER — Ambulatory Visit (INDEPENDENT_AMBULATORY_CARE_PROVIDER_SITE_OTHER): Payer: Medicare Other | Admitting: Cardiology

## 2016-10-18 ENCOUNTER — Encounter (INDEPENDENT_AMBULATORY_CARE_PROVIDER_SITE_OTHER): Payer: Self-pay

## 2016-10-18 ENCOUNTER — Encounter: Payer: Self-pay | Admitting: Cardiology

## 2016-10-18 VITALS — BP 132/72 | HR 69 | Ht 69.0 in | Wt 208.0 lb

## 2016-10-18 DIAGNOSIS — E785 Hyperlipidemia, unspecified: Secondary | ICD-10-CM | POA: Diagnosis not present

## 2016-10-18 DIAGNOSIS — I482 Chronic atrial fibrillation, unspecified: Secondary | ICD-10-CM

## 2016-10-18 DIAGNOSIS — I2510 Atherosclerotic heart disease of native coronary artery without angina pectoris: Secondary | ICD-10-CM

## 2016-10-18 DIAGNOSIS — I10 Essential (primary) hypertension: Secondary | ICD-10-CM

## 2016-10-18 DIAGNOSIS — Z5181 Encounter for therapeutic drug level monitoring: Secondary | ICD-10-CM

## 2016-10-18 DIAGNOSIS — Z7901 Long term (current) use of anticoagulants: Secondary | ICD-10-CM | POA: Diagnosis not present

## 2016-10-18 DIAGNOSIS — I208 Other forms of angina pectoris: Secondary | ICD-10-CM

## 2016-10-18 MED ORDER — NITROGLYCERIN 0.4 MG/SPRAY TL SOLN: 1.0000 | 3 refills | Status: DC | PRN

## 2016-10-18 NOTE — Patient Instructions (Signed)
Medication Instructions:   Your physician recommends that you continue on your current medications as directed. Please refer to the Current Medication list given to you today.   Labwork:  PRIOR TOO YOUR 4 MONTH FOLLOW-UP APPOINTMENT WITH DR NELSON TO CHECK---CMET, CBC W DIFF, TSH, AND LIPIDS---PLEASE COME FASTING TO THIS LAB APPOINTMENT     Follow-Up:  4 MONTHS WITH DR NELSON---PLEASE HAVE YOUR LABS DONE A WEEK PRIOR TO THIS FOLLOW-UP APPOINTMENT       If you need a refill on your cardiac medications before your next appointment, please call your pharmacy.

## 2016-10-18 NOTE — Progress Notes (Signed)
10/18/2016 Vincent [name]   1936/04/23  [id number]  Primary Physician No PCP Per Patient Primary Cardiologist: Dr. Meda Coffee  Reason for Visit/CC: F/u for CAD  HPI:  81 y/o male, followed by Dr. Meda Coffee, who presents to clinic for f/u. He has h/o CAD, chronic systolic HF/ alcoholic cardiomyopathy, PAF that is rate conrolled on chronic coumadin as well as h/o HTN and prior h/o CVA. In 06/2015 he underwent a NST for CP evaluation. This was an abnormal study. Subsequent LHC per Dr. [name] 12/16 demonstrated patent previously placed mid RCA stent with 50-60% narrowing distal to the stent, unchanged from prior cath in 2006. There was also a codominant left coronary system with luminal irregularities in LAD and distal circumflex 70-75% stenosis before the origin of a small branching obtuse marginal. The distal circumflex disease has progressed since 2006. The territory supplied beyond the stenosis is small. EF was 35%. Dr. [name] felt that the circumflex would be best managed medically, given the territory supplied by this vessel is small and stenting would have required triple therapy and risk of bleeding would be greater than the benefit achieved by stenting this relatively limited territory. He was treated medically.    He was seen by Dr. Meda Coffee 10/2015. At that time, he seemed very concern regarding medical management of his circumflex lesion.  He was very anxious given 2 of his sisters died secondary to heart attacks. Imdur was added. Dr. Meda Coffee instructed him to f/u in 2 months to reassess. If still symptomatic, she would reconsider PCI of the circumflex.   He presents back for f/u. He continues to have exertional CP with moderate physical activity. This occurs ~2-3 times a week and resolves with rest. It is improved some with addition of Imdur. He is only taking a low dose, 15 mg daily. He is tolerating well w/o HA or other side effects. BP is stable. No resting chest pain and no dyspnea.   10/18/2016 - is  a 6 months follow-up, the patient states that he has been feeling about the same, he gets chest pain on moderate exertion such as walking up the hill but not more often than once a months. He denies any worsening dyspnea on exertion, he is being compliant with his medication denies any muscle pain or lower extremity edema. He also denies any paroxysmal nocturnal dyspnea falls or syncope. He needs refills for sublingual nitroglycerin.  Current Outpatient Prescriptions  Medication Sig Dispense Refill  . aspirin 81 MG chewable tablet Chew 81 mg by mouth daily.    Marland Kitchen atorvastatin (LIPITOR) 40 MG tablet Take 1 tablet (40 mg total) by mouth daily. 90 tablet 2  . brimonidine (ALPHAGAN) 0.2 % ophthalmic solution Place 1 drop into both eyes 2 (two) times daily.  5  . carvedilol (COREG) 6.25 MG tablet Take 1 tablet (6.25 mg total) by mouth 2 (two) times daily. 180 tablet 6  . colchicine 0.6 MG tablet Take 0.6 mg by mouth daily.     . famotidine (PEPCID) 20 MG tablet Take 20 mg by mouth 2 (two) times daily.    . ferrous sulfate 325 (65 FE) MG tablet Take 325 mg by mouth daily with breakfast.    . fluocinonide cream (LIDEX) [id number] % Apply 1 application topically daily as needed (itching).   2  . furosemide (LASIX) 20 MG tablet Take 1 tablet (20 mg total) by mouth daily. 30 tablet 3  . hydrocortisone (ANUSOL-HC) 2.5 % rectal cream Place 1 application rectally 2 (two)  times daily as needed for hemorrhoids or itching. 30 g 1  . hydrocortisone cream 1 % Apply 1 application topically 2 (two) times daily as needed (for eczmea).    Marland Kitchen ipratropium (ATROVENT) 0.03 % nasal spray Place 2 sprays into both nostrils every 12 (twelve) hours. 30 mL 12  . irbesartan (AVAPRO) 300 MG tablet TAKE 1 TABLET(300 MG) BY MOUTH AT BEDTIME 90 tablet 2  . isosorbide mononitrate (IMDUR) 30 MG 24 hr tablet Take 1 tablet (30 mg total) by mouth daily. 90 tablet 3  . latanoprost (XALATAN) 0.005 % ophthalmic solution Place 1 drop into both eyes at  bedtime.    Marland Kitchen loratadine (CLARITIN) 10 MG tablet Take 10 mg by mouth daily.    . meclizine (ANTIVERT) 25 MG tablet Take 25 mg by mouth 3 (three) times daily as needed for dizziness.    . nitroGLYCERIN (NITROLINGUAL) 0.4 MG/SPRAY spray Place 1 spray under the tongue every 5 (five) minutes x 3 doses as needed for chest pain.    . Omega-3 Fatty Acids (FISH OIL) 1000 MG CAPS Take 1,000 mg by mouth 2 (two) times daily.     . pseudoephedrine (SUDAFED) 60 MG tablet Take 60 mg by mouth every 4 (four) hours as needed for congestion.    Marland Kitchen spironolactone (ALDACTONE) 25 MG tablet Take 1 tablet (25 mg total) by mouth daily. 90 tablet 6  . Tamsulosin HCl (FLOMAX) 0.4 MG CAPS Take 0.4 mg by mouth daily.    Marland Kitchen triamcinolone cream (KENALOG) 0.1 % Apply 1 application topically 2 (two) times daily. Eczema  3  . warfarin (COUMADIN) 2 MG tablet TAKE AS DIRECTED BY COUMADIN CLINIC 150 tablet 1   Current Facility-Administered Medications  Medication Dose Route Frequency Provider Last Rate Last Dose  . 0.9 %  sodium chloride infusion  500 mL Intravenous Continuous Nelida Meuse III, MD        No Known Allergies  Social History   Social History  . Marital status: Divorced    Spouse name: N/A  . Number of children: 0  . Years of education: master's   Occupational History  . semi-retired    Social History Main Topics  . Smoking status: Former Smoker    Quit date: 08/22/1983  . Smokeless tobacco: Never Used  . Alcohol use No  . Drug use: No  . Sexual activity: Not on file   Other Topics Concern  . Not on file   Social History Narrative   Patient drinks 1-2 cups of caffeine daily.   Patient is right handed.     Review of Systems: General: negative for chills, fever, night sweats or weight changes.  Cardiovascular: negative for chest pain, dyspnea on exertion, edema, orthopnea, palpitations, paroxysmal nocturnal dyspnea or shortness of breath Dermatological: negative for rash Respiratory: negative  for cough or wheezing Urologic: negative for hematuria Abdominal: negative for nausea, vomiting, diarrhea, bright red blood per rectum, melena, or hematemesis Neurologic: negative for visual changes, syncope, or dizziness All other systems reviewed and are otherwise negative except as noted above.  Blood pressure 132/72, pulse 69, height 5\' 9"  (1.753 m), weight 208 lb (94.3 kg).  General appearance: alert, cooperative and no distress Neck: no carotid bruit and no JVD Lungs: clear to auscultation bilaterally Heart: regular rate and rhythm, S1, S2 normal, no murmur, click, rub or gallop Extremities: no LEE Pulses: 2+ and symmetric Skin: warm and dry Neurologic: Grossly normal  EKG not performed.     ASSESSMENT AND PLAN:  1. CAD: s/p recent LHC in 07/2015 with circumflex anatomy with 50-60% stenosis in the RCA, and 70-75% stenosis in the distal circumflex artery. Recommendations were to manage medically as risk for triple therapy with PCI would be greater than benefit, given the territory supplied, distal to the area is very small. He continues to have stable angina with moderate activity. His symptoms have improved in the last year. His EKG today is different with new negative T waves in anterolateral leads however considering clinical symptoms will continue the same medical management. Follow-up in 4 months.  2. PAF: rate is well controlled. Asymptomatic. On a/c with Coumadin. INRs followed in our coumadin clinic. He denies any abnormal bleeding.  3. Chronic Systolic HF: volume stable. He denies dyspnea. Continue lasix and low sodium diet.   4. HTN: BP is well controlled.    Ena Dawley, MD 10/18/2016 12:06 PM

## 2016-10-21 ENCOUNTER — Telehealth: Payer: Self-pay | Admitting: Cardiology

## 2016-10-21 ENCOUNTER — Telehealth: Payer: Self-pay

## 2016-10-21 NOTE — Telephone Encounter (Signed)
Noted  

## 2016-10-21 NOTE — Telephone Encounter (Signed)
Prior Auth for NTG 0.4 mg spray has been sent to [employer]. Awaiting response.

## 2016-10-21 NOTE — Telephone Encounter (Signed)
Will forward to [place] as an [place].

## 2016-10-21 NOTE — Telephone Encounter (Signed)
°  New Prob   Calling to notify office that Nitroglycerin 0.4 mg medication has been approved for 1 year for this patient. Will send [place] a letter in the mail.

## 2016-10-27 NOTE — Telephone Encounter (Signed)
**Note De-Identified  Obfuscation** Vincent [name]      10/21/16 12:37 PM  Note     New Prob   Calling to notify office that Nitroglycerin 0.4 mg medication has been approved for 1 year for this patient. Will send [place] a letter in the mail.          10/21/16 12:36 PM    Bryan contacted Vincent [name]

## 2016-11-02 ENCOUNTER — Ambulatory Visit (INDEPENDENT_AMBULATORY_CARE_PROVIDER_SITE_OTHER): Payer: Medicare Other | Admitting: *Deleted

## 2016-11-02 DIAGNOSIS — I482 Chronic atrial fibrillation, unspecified: Secondary | ICD-10-CM

## 2016-11-02 DIAGNOSIS — Z5181 Encounter for therapeutic drug level monitoring: Secondary | ICD-10-CM | POA: Diagnosis not present

## 2016-11-02 DIAGNOSIS — I6350 Cerebral infarction due to unspecified occlusion or stenosis of unspecified cerebral artery: Secondary | ICD-10-CM | POA: Diagnosis not present

## 2016-11-02 LAB — POCT INR: INR: 2.5

## 2016-11-21 ENCOUNTER — Ambulatory Visit (HOSPITAL_COMMUNITY)
Admission: EM | Admit: 2016-11-21 | Discharge: 2016-11-21 | Disposition: A | Discharge status: Home / Self Care | Payer: Medicare Other | Attending: Internal Medicine | Admitting: Internal Medicine

## 2016-11-21 ENCOUNTER — Encounter (HOSPITAL_COMMUNITY): Payer: Self-pay | Admitting: *Deleted

## 2016-11-21 DIAGNOSIS — J309 Allergic rhinitis, unspecified: Secondary | ICD-10-CM

## 2016-11-21 MED ORDER — BENZONATATE 100 MG PO CAPS: 100.0000 mg | ORAL_CAPSULE | Freq: Three times a day (TID) | ORAL | 0 refills | Status: DC

## 2016-11-21 MED ORDER — IPRATROPIUM BROMIDE 0.06 % NA SOLN: 2.0000 | Freq: Four times a day (QID) | NASAL | 3 refills | Status: DC

## 2016-11-21 NOTE — ED Triage Notes (Signed)
Pt  Has   Several  Days  Of   Cough   /  Congestion         Had   Some  Nasal    Congestion  /   Fatigue    ;ast  Month  And  Was   Seen  At a  Walk  In  Clinic  For  Same   At that  Time

## 2016-11-21 NOTE — Discharge Instructions (Signed)
Your symptoms appear to be consistent with allergic rhinitis. I recommend you continue to take your loratadine every day. I've also prescribed ipratropium nasal spray, due to sprays each nostril 4 times a day as needed, and for cough Tessalon take one tablet every 8 hours as needed. Should your symptoms fail to resolve, follow up with her primary care provider or return to clinic.

## 2016-11-21 NOTE — ED Provider Notes (Signed)
CSN: [id number]     Arrival date & time 11/21/16  1000 History   None    Chief Complaint  Patient presents with  . Cough   (Consider location/radiation/quality/duration/timing/severity/associated sxs/prior Treatment) 81 year old male presents to clinic with a 10 day to 2 week history of cough and runny nose. [facility], reports he had improvement in those symptoms, however rhinorrhea, and cough remaining.   The history is provided by the patient.  Cough  Cough characteristics:  Non-productive, dry and hacking Sputum characteristics:  Clear Severity:  Mild Onset quality:  Gradual Duration:  2 weeks Timing:  Intermittent Progression:  Waxing and waning Chronicity:  New Smoker: no   Context: upper respiratory infection   Relieved by:  Ipratropium inhaler Worsened by:  Nothing Associated symptoms: rhinorrhea and sinus congestion   Associated symptoms: no chest pain, no chills, no ear fullness, no ear pain, no eye discharge, no fever, no headaches, no myalgias, no shortness of breath, no sore throat and no wheezing   Rhinorrhea:    Quality:  Clear   Severity:  Moderate   Duration:  2 weeks   Timing:  Constant   Progression:  Unchanged   Past Medical History:  Diagnosis Date  . Alcoholic cardiomyopathy ([place])   . Atrial fibrillation ([place])   . Bradycardia   . CHF (congestive heart failure) ([place])   . 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 COPD (chronic obstructive pulmonary disease) ([place])   . Coronary artery disease   . Foot drop, left 03/11/2015  . Gait disorder 03/11/2015  . GERD (gastroesophageal reflux disease)   . Glaucoma   . Gout   . Hemiparesis and alteration of sensations as late effects of stroke ([place]) 09/25/2015  . Hyperlipidemia   . Hypertension   . Long term (current) use of anticoagulants   . 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 Pulmonary eosinophilia ([place])   . Stroke ([place])   .  Syncope and collapse   . Unspecified glaucoma(365.9)   . V-tach [facility])    Past Surgical History:  Procedure Laterality Date  . CARDIAC CATHETERIZATION N/A 07/30/2015   Procedure: Left Heart Cath and Coronary Angiography;  Surgeon: Belva Crome, MD;  Location: [place] [facility] [facility];  Service: Cardiovascular;  Laterality: N/A;  . COLONOSCOPY    . KNEE SURGERY    . loop recorder  09/2007   Family History  Problem Relation Age of Onset  . Colon cancer Mother   . Hypertension Mother   . Cancer Mother   . Prostate cancer Brother   . Cancer Brother   . Heart disease Father   . Heart attack Father   . Hypertension Father   . Heart attack Sister   . Cancer Sister   . Stroke Neg Hx   . Colon polyps Neg Hx   . Esophageal cancer Neg Hx   . Stomach cancer Neg Hx   . Rectal cancer Neg Hx   . Pancreatic cancer Neg Hx    Social History  Substance Use Topics  . Smoking status: Former Smoker    Quit date: 08/22/1983  . Smokeless tobacco: Never Used  . Alcohol use No    Review of Systems  Constitutional: Negative for chills and fever.  HENT: Positive for rhinorrhea. Negative for ear pain and sore throat.   Eyes: Negative for discharge.  Respiratory: Positive for cough. Negative for shortness of breath and wheezing.  Cardiovascular: Negative for chest pain and leg swelling.  Gastrointestinal: Negative for constipation, diarrhea, nausea and vomiting.  Genitourinary: Negative.   Musculoskeletal: Negative for myalgias, neck pain and neck stiffness.  Neurological: Negative for dizziness, weakness and headaches.  All other systems reviewed and are negative.   Allergies  Patient has no known allergies.  Home Medications   Prior to Admission medications   Medication Sig Start Date End Date Taking? Authorizing Provider  aspirin 81 MG chewable tablet Chew 81 mg by mouth daily.    Historical Provider, MD  atorvastatin (LIPITOR) 40 MG tablet Take 1 tablet (40 mg total) by mouth daily.  08/02/16   [name], MD  benzonatate (TESSALON) 100 MG capsule Take 1 capsule (100 mg total) by mouth every 8 (eight) hours. 11/21/16   [name], NP  brimonidine (ALPHAGAN) 0.2 % ophthalmic solution Place 1 drop into both eyes 2 (two) times daily. 07/30/16   Historical Provider, MD  carvedilol (COREG) 6.25 MG tablet Take 1 tablet (6.25 mg total) by mouth 2 (two) times daily. 04/27/16   [name], MD  colchicine 0.6 MG tablet Take 0.6 mg by mouth daily.     Historical Provider, MD  famotidine (PEPCID) 20 MG tablet Take 20 mg by mouth 2 (two) times daily.    Historical Provider, MD  ferrous sulfate 325 (65 FE) MG tablet Take 325 mg by mouth daily with breakfast.    Historical Provider, MD  fluocinonide cream (LIDEX) 4.48 % Apply 1 application topically daily as needed (itching).  01/13/15   Historical Provider, MD  furosemide (LASIX) 20 MG tablet Take 1 tablet (20 mg total) by mouth daily. 07/21/15   [name], MD  hydrocortisone (ANUSOL-HC) 2.5 % rectal cream Place 1 application rectally 2 (two) times daily as needed for hemorrhoids or itching. 07/27/16   Levin Erp, PA  hydrocortisone cream 1 % Apply 1 application topically 2 (two) times daily as needed (for eczmea).    Historical Provider, MD  ipratropium (ATROVENT) 0.06 % nasal spray Place 2 sprays into both nostrils 4 (four) times daily. 11/21/16   [name], NP  irbesartan (AVAPRO) 300 MG tablet TAKE 1 TABLET(300 MG) BY MOUTH AT BEDTIME 07/25/16   [name], MD  isosorbide mononitrate (IMDUR) 30 MG 24 hr tablet Take 1 tablet (30 mg total) by mouth daily. 01/18/16   Brittainy M Simmons, PA-C  latanoprost (XALATAN) 0.005 % ophthalmic solution Place 1 drop into both eyes at bedtime.    Historical Provider, MD  loratadine (CLARITIN) 10 MG tablet Take 10 mg by mouth daily.    Historical Provider, MD  meclizine (ANTIVERT) 25 MG tablet Take 25 mg by mouth 3 (three) times daily as needed for dizziness.     Historical Provider, MD  nitroGLYCERIN (NITROLINGUAL) 0.4 MG/SPRAY spray Place 1 spray under the tongue every 5 (five) minutes x 3 doses as needed for chest pain. 10/18/16   [name], MD  Omega-3 Fatty Acids (FISH OIL) 1000 MG CAPS Take 1,000 mg by mouth 2 (two) times daily.     Historical Provider, MD  pseudoephedrine (SUDAFED) 60 MG tablet Take 60 mg by mouth every 4 (four) hours as needed for congestion.    Historical Provider, MD  spironolactone (ALDACTONE) 25 MG tablet Take 1 tablet (25 mg total) by mouth daily. 04/27/16   [name], MD  Tamsulosin HCl (FLOMAX) 0.4 MG CAPS Take 0.4 mg by mouth daily. 12/28/11   Historical Provider, MD  triamcinolone cream (KENALOG) 0.1 % Apply 1 application topically 2 (two) times daily. Eczema 01/20/15   Historical Provider, MD  warfarin (COUMADIN) 2 MG tablet TAKE AS DIRECTED BY COUMADIN CLINIC 06/27/16   [name], MD   Meds Ordered and Administered this Visit  Medications - No data to display  BP 134/72 (BP Location: Right Arm)   Pulse 78   Temp 98.6 F (37 C) (Oral)   Resp 18   SpO2 99%  No data found.   Physical Exam  Constitutional: He is oriented to person, place, and time. He appears well-developed and well-nourished. No distress.  HENT:  Head: Normocephalic and atraumatic.  Right Ear: Tympanic membrane and external ear normal.  Left Ear: Tympanic membrane and external ear normal.  Nose: Rhinorrhea (clear nasal discharge) present. Right sinus exhibits no maxillary sinus tenderness and no frontal sinus tenderness. Left sinus exhibits no maxillary sinus tenderness and no frontal sinus tenderness.  Mouth/Throat: Uvula is midline and oropharynx is clear and moist. No oropharyngeal exudate.  Eyes: Pupils are equal, round, and reactive to light.  Neck: Normal range of motion. Neck supple. No JVD present.  Cardiovascular: Normal rate and regular rhythm.   Pulmonary/Chest: Effort normal and breath sounds normal. No  respiratory distress. He has no wheezes.  Abdominal: Soft. Bowel sounds are normal.  Musculoskeletal: He exhibits no edema.  Lymphadenopathy:       Head (right side): No submental, no submandibular, no tonsillar and no preauricular adenopathy present.       Head (left side): No submental, no submandibular, no tonsillar and no preauricular adenopathy present.    He has no cervical adenopathy.  Neurological: He is alert and oriented to person, place, and time.  Skin: Skin is warm and dry. Capillary refill takes less than 2 seconds. No rash noted. He is not diaphoretic. No erythema.  Psychiatric: He has a normal mood and affect. His behavior is normal.  Nursing note and vitals reviewed.   Urgent Care Course     Procedures (including critical care time)  Labs Review Labs Reviewed - No data to display  Imaging Review No results found. Visual       MDM   1. Acute allergic rhinitis, unspecified seasonality, unspecified trigger     Treating for allergic rhinitis. Encouraged over-the-counter loratadine use, given prescriptions for Tessalon for cough, and ipratropium nasal spray for rhinorrhea. Follow-up with primary care provider if symptoms persist    [name], NP 11/21/16 1048

## 2016-11-30 ENCOUNTER — Ambulatory Visit (INDEPENDENT_AMBULATORY_CARE_PROVIDER_SITE_OTHER): Payer: Medicare Other | Admitting: Pharmacist

## 2016-11-30 DIAGNOSIS — Z5181 Encounter for therapeutic drug level monitoring: Secondary | ICD-10-CM | POA: Diagnosis not present

## 2016-11-30 DIAGNOSIS — I6350 Cerebral infarction due to unspecified occlusion or stenosis of unspecified cerebral artery: Secondary | ICD-10-CM

## 2016-11-30 DIAGNOSIS — I482 Chronic atrial fibrillation, unspecified: Secondary | ICD-10-CM

## 2016-11-30 LAB — POCT INR: INR: 2.5

## 2016-12-13 ENCOUNTER — Other Ambulatory Visit: Payer: Self-pay | Admitting: Cardiology

## 2017-01-05 ENCOUNTER — Ambulatory Visit (INDEPENDENT_AMBULATORY_CARE_PROVIDER_SITE_OTHER): Payer: Medicare Other | Admitting: *Deleted

## 2017-01-05 DIAGNOSIS — I6350 Cerebral infarction due to unspecified occlusion or stenosis of unspecified cerebral artery: Secondary | ICD-10-CM | POA: Diagnosis not present

## 2017-01-05 DIAGNOSIS — Z5181 Encounter for therapeutic drug level monitoring: Secondary | ICD-10-CM

## 2017-01-05 DIAGNOSIS — I482 Chronic atrial fibrillation, unspecified: Secondary | ICD-10-CM

## 2017-01-05 LAB — POCT INR: INR: 2.6

## 2017-02-11 ENCOUNTER — Other Ambulatory Visit: Payer: Self-pay | Admitting: Cardiology

## 2017-02-18 IMAGING — CT CT HEAD W/O CM
1 series · 15 of 29 positions shown, 19 images · non-contrast
Comparison: 03/19/2014 MR. 03/17/2014 CT.

CLINICAL DATA: 79-year-old hypertensive male with history of prior
stroke and left-sided weakness presenting with numbness left arm and
leg which is different than prior symptoms with onset 6-7 o'clock
this morning. Subsequent encounter.

EXAM:
CT HEAD WITHOUT CONTRAST
TECHNIQUE: Contiguous axial images were obtained from the base of the skull
through the vertex without intravenous contrast.

[Series 2: head 5.0 h30s · axial · 0.41mm/px · z∈[-109,+21]mm · 15 of 29 slices shown, 19 images]
[im 2/29  brain]
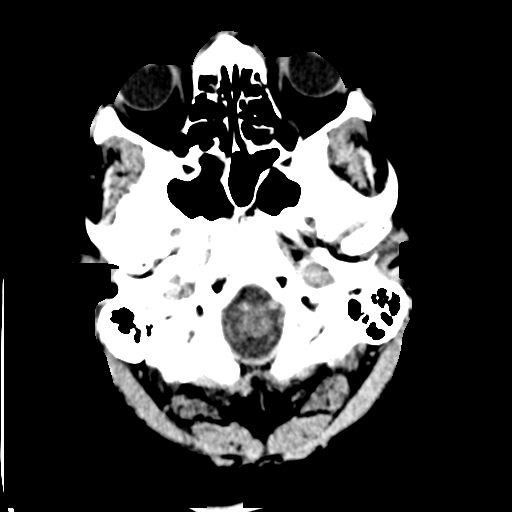
[im 2/29  bone]
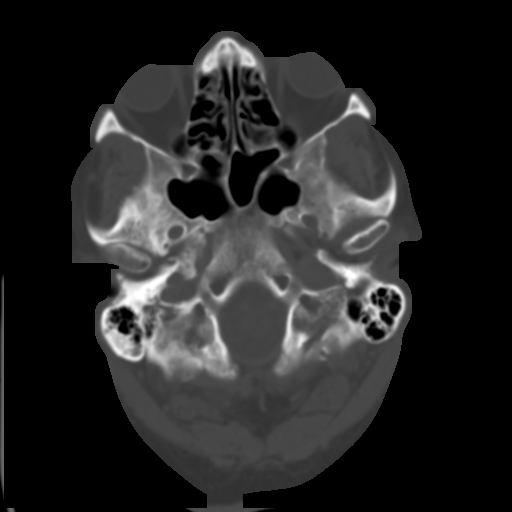
[im 4/29  brain]
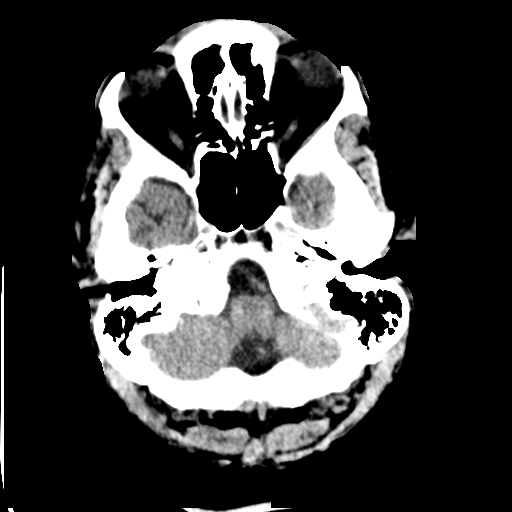
[im 6/29  brain]
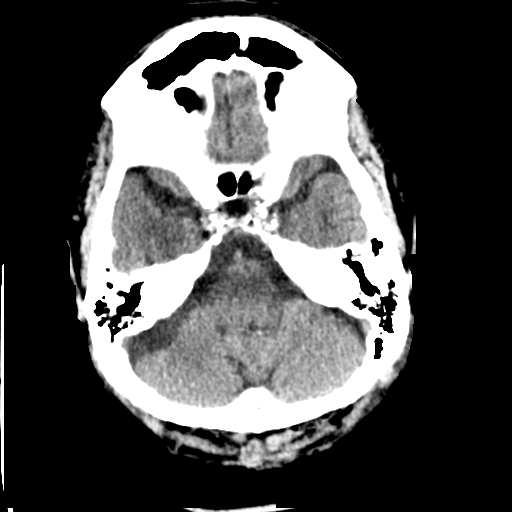
[im 8/29  brain]
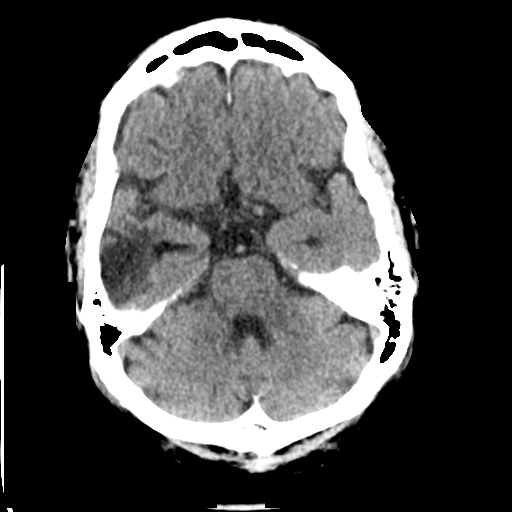
[im 10/29  brain]
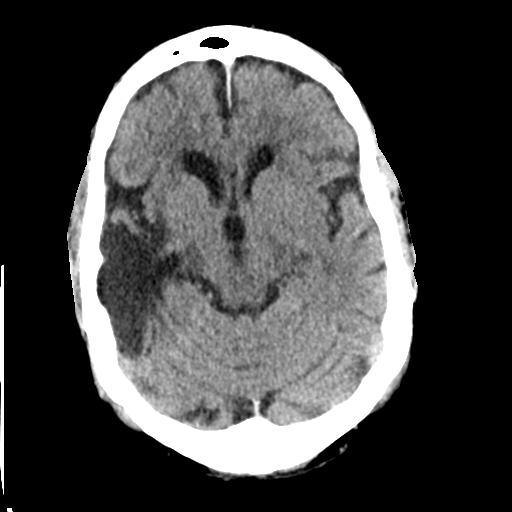
[im 10/29  bone]
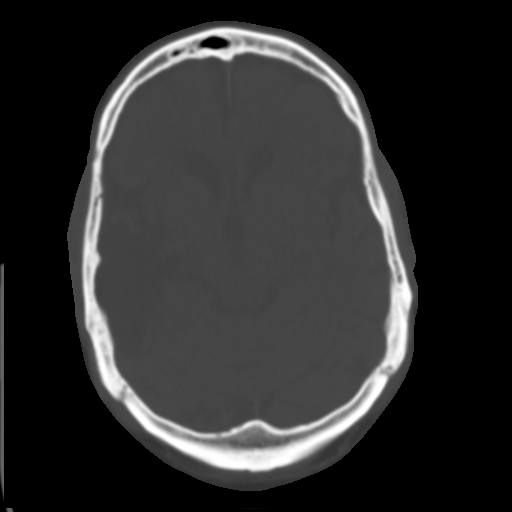
[im 11/29  brain]
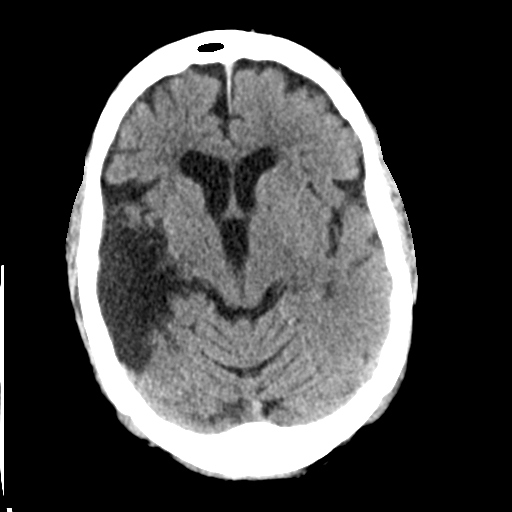
[im 13/29  brain]
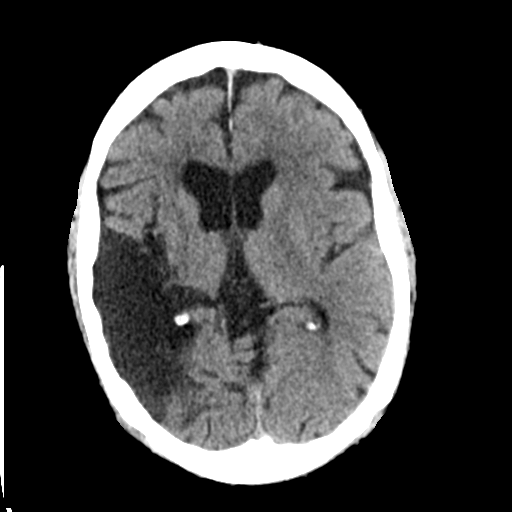
[im 15/29  brain]
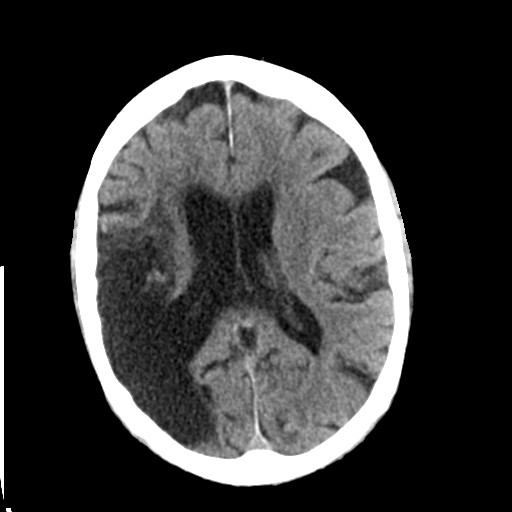
[im 17/29  brain]
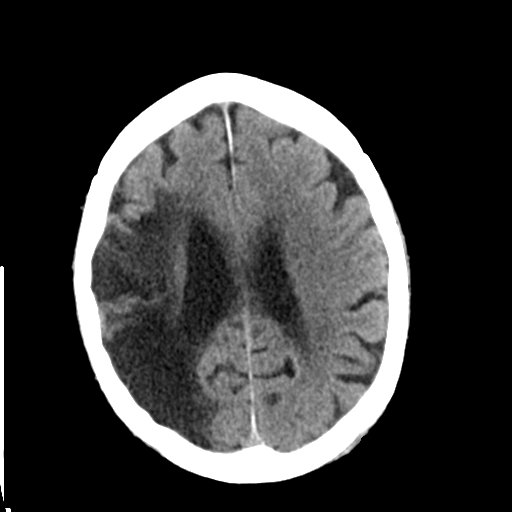
[im 17/29  bone]
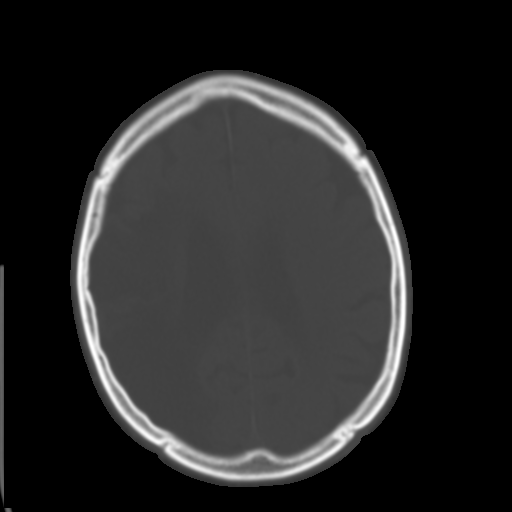
[im 19/29  brain]
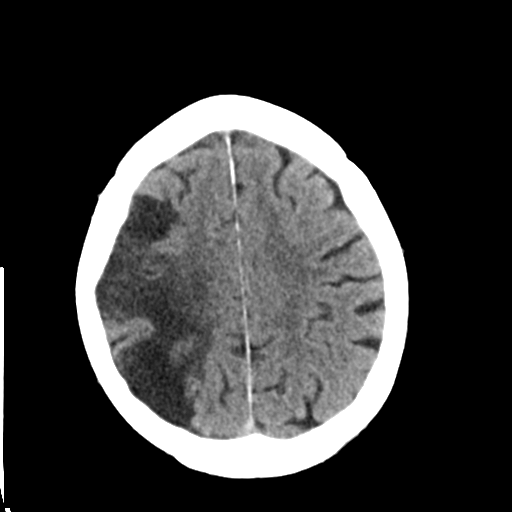
[im 20/29  brain]
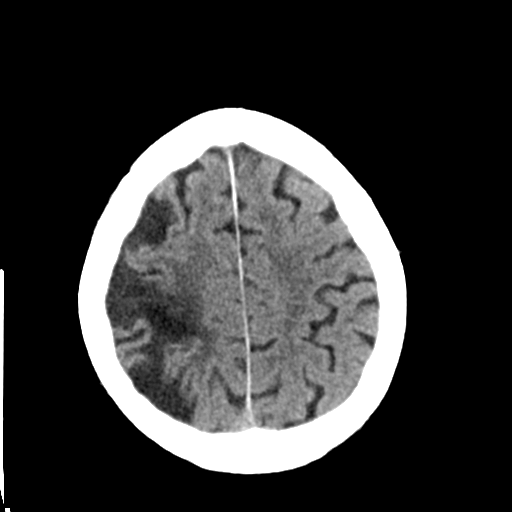
[im 22/29  brain]
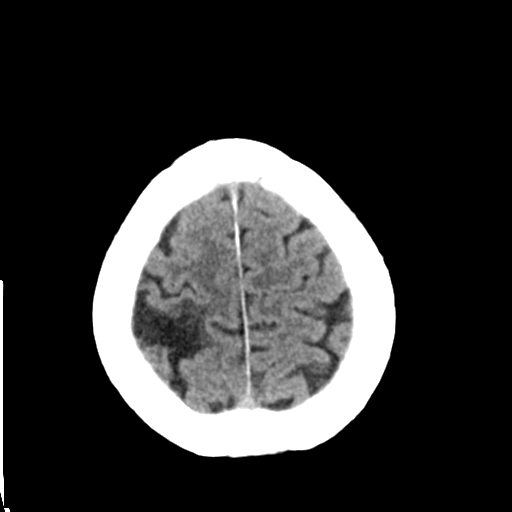
[im 24/29  brain]
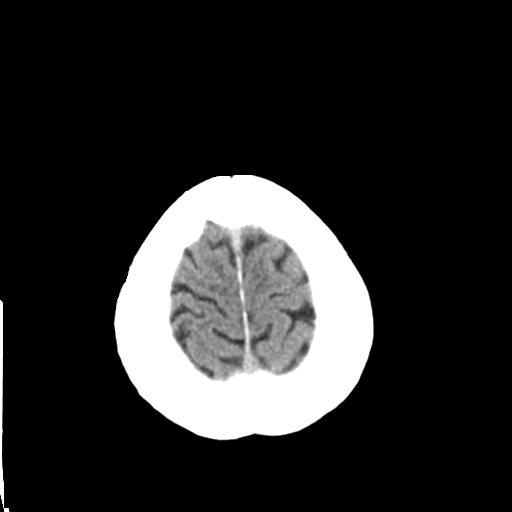
[im 24/29  bone]
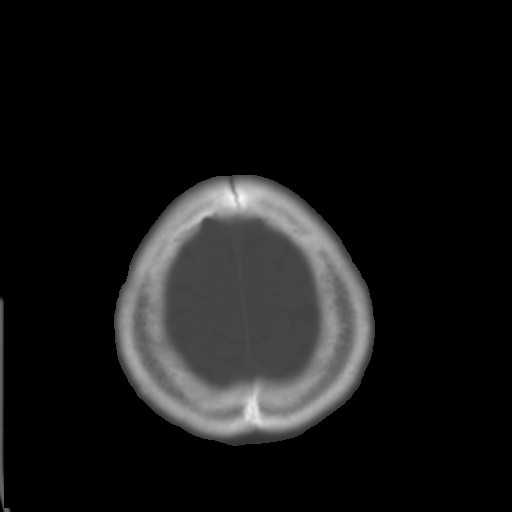
[im 26/29  brain]
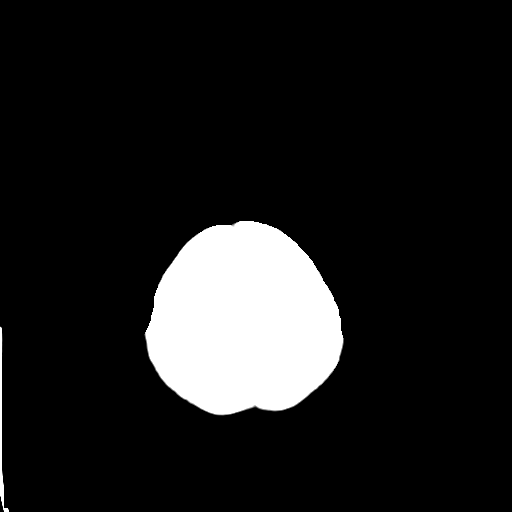
[im 28/29  brain]
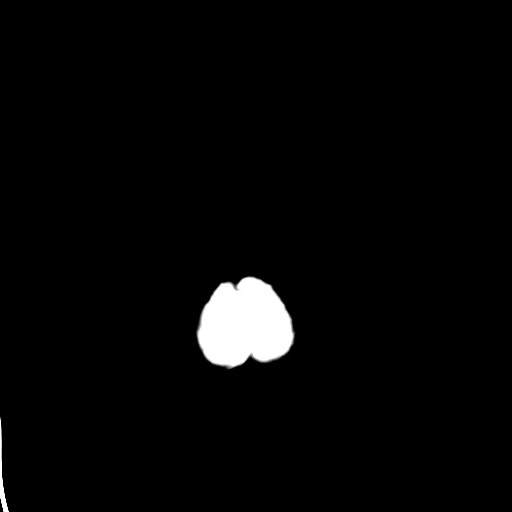

[15 of 29 positions shown; findings below may reference images not displayed]

FINDINGS: Large remote right middle cerebral artery distribution infarct with
large area of encephalomalacia right temporal lobe, right frontal
lobe and right parietal lobe with subsequent dilation of the right
lateral ventricle.

No CT evidence of large acute infarct.

Global atrophy without hydrocephalus.

No intracranial hemorrhage.

No intracranial mass lesion noted on this unenhanced exam.

Vascular calcifications.

Minimal mucosal thickening ethmoid sinus air cells, frontal sinus
and right sphenoid sinus.
IMPRESSION: Large remote right middle cerebral artery distribution infarct.

No CT evidence of large acute infarct.

Global atrophy without hydrocephalus.

No intracranial hemorrhage.

## 2017-02-20 ENCOUNTER — Other Ambulatory Visit: Payer: Medicare Other | Admitting: *Deleted

## 2017-02-20 ENCOUNTER — Ambulatory Visit (INDEPENDENT_AMBULATORY_CARE_PROVIDER_SITE_OTHER): Payer: Medicare Other | Admitting: *Deleted

## 2017-02-20 DIAGNOSIS — I10 Essential (primary) hypertension: Secondary | ICD-10-CM | POA: Diagnosis not present

## 2017-02-20 DIAGNOSIS — I6350 Cerebral infarction due to unspecified occlusion or stenosis of unspecified cerebral artery: Secondary | ICD-10-CM | POA: Diagnosis not present

## 2017-02-20 DIAGNOSIS — Z5181 Encounter for therapeutic drug level monitoring: Secondary | ICD-10-CM | POA: Diagnosis not present

## 2017-02-20 DIAGNOSIS — Z7901 Long term (current) use of anticoagulants: Secondary | ICD-10-CM

## 2017-02-20 DIAGNOSIS — E785 Hyperlipidemia, unspecified: Secondary | ICD-10-CM | POA: Diagnosis not present

## 2017-02-20 DIAGNOSIS — I482 Chronic atrial fibrillation, unspecified: Secondary | ICD-10-CM

## 2017-02-20 LAB — CBC WITH DIFFERENTIAL/PLATELET
Basophils Absolute: 0 10*3/uL (ref 0.0–0.2)
Basos: 0 %
EOS (ABSOLUTE): 0.2 10*3/uL (ref 0.0–0.4)
Eos: 6 %
Hematocrit: 39.1 % (ref 37.5–51.0)
Hemoglobin: 12.9 g/dL — ABNORMAL LOW (ref 13.0–17.7)
Immature Grans (Abs): 0 10*3/uL (ref 0.0–0.1)
Immature Granulocytes: 0 %
Lymphocytes Absolute: 0.7 10*3/uL (ref 0.7–3.1)
Lymphs: 25 %
MCH: 26.8 pg (ref 26.6–33.0)
MCHC: 33 g/dL (ref 31.5–35.7)
MCV: 81 fL (ref 79–97)
Monocytes Absolute: 0.3 10*3/uL (ref 0.1–0.9)
Monocytes: 12 %
Neutrophils Absolute: 1.7 10*3/uL (ref 1.4–7.0)
Neutrophils: 57 %
Platelets: 185 10*3/uL (ref 150–379)
RBC: 4.82 x10E6/uL (ref 4.14–5.80)
RDW: 14.8 % (ref 12.3–15.4)
WBC: 2.9 10*3/uL — ABNORMAL LOW (ref 3.4–10.8)

## 2017-02-20 LAB — TSH: TSH: 0.908 u[IU]/mL (ref 0.450–4.500)

## 2017-02-20 LAB — COMPREHENSIVE METABOLIC PANEL
ALT: 25 IU/L (ref 0–44)
AST: 29 IU/L (ref 0–40)
Albumin/Globulin Ratio: 1.4 (ref 1.2–2.2)
Albumin: 3.9 g/dL (ref 3.5–4.8)
Alkaline Phosphatase: 83 IU/L (ref 39–117)
BUN/Creatinine Ratio: 8 — ABNORMAL LOW (ref 10–24)
BUN: 8 mg/dL (ref 8–27)
Bilirubin Total: 1.1 mg/dL (ref 0.0–1.2)
CO2: 25 mmol/L (ref 20–29)
Calcium: 8.9 mg/dL (ref 8.6–10.2)
Chloride: 100 mmol/L (ref 96–106)
Creatinine, Ser: 1.01 mg/dL (ref 0.76–1.27)
GFR calc Af Amer: 82 mL/min/{1.73_m2} (ref >59)
GFR calc non Af Amer: 71 mL/min/{1.73_m2} (ref >59)
Globulin, Total: 2.8 g/dL (ref 1.5–4.5)
Glucose: 223 mg/dL — ABNORMAL HIGH (ref 65–99)
Potassium: 4.4 mmol/L (ref 3.5–5.2)
Sodium: 138 mmol/L (ref 134–144)
Total Protein: 6.7 g/dL (ref 6.0–8.5)

## 2017-02-20 LAB — POCT INR: INR: 2.4

## 2017-02-20 LAB — LIPID PANEL
Chol/HDL Ratio: 3 ratio (ref 0.0–5.0)
Cholesterol, Total: 164 mg/dL (ref 100–199)
HDL: 54 mg/dL (ref >39)
LDL Calculated: 92 mg/dL (ref 0–99)
Triglycerides: 92 mg/dL (ref 0–149)
VLDL Cholesterol Cal: 18 mg/dL (ref 5–40)

## 2017-02-22 ENCOUNTER — Encounter: Payer: Self-pay | Admitting: Cardiology

## 2017-02-22 ENCOUNTER — Ambulatory Visit (INDEPENDENT_AMBULATORY_CARE_PROVIDER_SITE_OTHER): Payer: Medicare Other | Admitting: Cardiology

## 2017-02-22 VITALS — BP 150/72 | HR 90 | Ht 69.0 in | Wt 204.0 lb

## 2017-02-22 DIAGNOSIS — I2510 Atherosclerotic heart disease of native coronary artery without angina pectoris: Secondary | ICD-10-CM

## 2017-02-22 DIAGNOSIS — E7849 Other hyperlipidemia: Secondary | ICD-10-CM

## 2017-02-22 DIAGNOSIS — I5023 Acute on chronic systolic (congestive) heart failure: Secondary | ICD-10-CM | POA: Diagnosis not present

## 2017-02-22 DIAGNOSIS — I10 Essential (primary) hypertension: Secondary | ICD-10-CM | POA: Diagnosis not present

## 2017-02-22 DIAGNOSIS — I482 Chronic atrial fibrillation: Secondary | ICD-10-CM | POA: Diagnosis not present

## 2017-02-22 DIAGNOSIS — I2583 Coronary atherosclerosis due to lipid rich plaque: Secondary | ICD-10-CM

## 2017-02-22 DIAGNOSIS — I4821 Permanent atrial fibrillation: Secondary | ICD-10-CM

## 2017-02-22 DIAGNOSIS — R9439 Abnormal result of other cardiovascular function study: Secondary | ICD-10-CM

## 2017-02-22 DIAGNOSIS — Z01812 Encounter for preprocedural laboratory examination: Secondary | ICD-10-CM

## 2017-02-22 DIAGNOSIS — E784 Other hyperlipidemia: Secondary | ICD-10-CM

## 2017-02-22 MED ORDER — FUROSEMIDE 20 MG PO TABS: 20.0000 mg | ORAL_TABLET | Freq: Every day | ORAL | 2 refills | Status: DC

## 2017-02-22 NOTE — Patient Instructions (Signed)
Medication Instructions:   Your physician recommends that you continue on your current medications as directed. Please refer to the Current Medication list given to you today.    Follow-Up:  4 MONTHS WITH DR NELSON       If you need a refill on your cardiac medications before your next appointment, please call your pharmacy.   

## 2017-02-22 NOTE — Progress Notes (Signed)
02/22/2017 Vincent [name]   01-Feb-1936  [id number]  Primary Physician Patient, No Pcp Per Primary Cardiologist: Dr. Meda Coffee  Reason for Visit/CC: F/u for CAD  HPI:  81 y/o male, followed by Dr. Meda Coffee, who presents to clinic for f/u. He has h/o CAD, chronic systolic HF/ alcoholic cardiomyopathy, PAF that is rate conrolled on chronic coumadin as well as h/o HTN and prior h/o CVA. In 06/2015 he underwent a NST for CP evaluation. This was an abnormal study. Subsequent LHC per Dr. [name] 12/16 demonstrated patent previously placed mid RCA stent with 50-60% narrowing distal to the stent, unchanged from prior cath in 2006. There was also a codominant left coronary system with luminal irregularities in LAD and distal circumflex 70-75% stenosis before the origin of a small branching obtuse marginal. The distal circumflex disease has progressed since 2006. The territory supplied beyond the stenosis is small. EF was 35%. Dr. [name] felt that the circumflex would be best managed medically, given the territory supplied by this vessel is small and stenting would have required triple therapy and risk of bleeding would be greater than the benefit achieved by stenting this relatively limited territory. He was treated medically.    He was seen by Dr. Meda Coffee 10/2015. At that time, he seemed very concern regarding medical management of his circumflex lesion.  He was very anxious given 2 of his sisters died secondary to heart attacks. Imdur was added. Dr. Meda Coffee instructed him to f/u in 2 months to reassess. If still symptomatic, she would reconsider PCI of the circumflex.   He presents back for f/u. He continues to have exertional CP with moderate physical activity. This occurs ~2-3 times a week and resolves with rest. It is improved some with addition of Imdur. He is only taking a low dose, 15 mg daily. He is tolerating well w/o HA or other side effects. BP is stable. No resting chest pain and no dyspnea.   10/18/2016 - is  a 6 months follow-up, the patient states that he has been feeling about the same, he gets chest pain on moderate exertion such as walking up the hill but not more often than once a months. He denies any worsening dyspnea on exertion, he is being compliant with his medication denies any muscle pain or lower extremity edema. He also denies any paroxysmal nocturnal dyspnea falls or syncope. He needs refills for sublingual nitroglycerin.  02/22/2017, the patient states that he is feeling about the same, he gets chest pain approximately twice a week when he walks up the hill, this resolve at rest sitting 5-10 minutes. This has been stable over the last 2 years. He has been compliant with his medications, denies any muscle pain with atorvastatin and no bleeding with warfarin.  Current Outpatient Prescriptions  Medication Sig Dispense Refill  . aspirin 81 MG chewable tablet Chew 81 mg by mouth daily.    Marland Kitchen atorvastatin (LIPITOR) 40 MG tablet Take 1 tablet (40 mg total) by mouth daily. 90 tablet 2  . brimonidine (ALPHAGAN) 0.2 % ophthalmic solution Place 1 drop into both eyes 2 (two) times daily.  5  . carvedilol (COREG) 6.25 MG tablet Take 1 tablet (6.25 mg total) by mouth 2 (two) times daily. 180 tablet 6  . colchicine 0.6 MG tablet Take 0.6 mg by mouth daily.     . famotidine (PEPCID) 20 MG tablet Take 20 mg by mouth 2 (two) times daily.    . ferrous sulfate 325 (65 FE) MG tablet Take  325 mg by mouth daily with breakfast.    . fluocinonide cream (LIDEX) 8.93 % Apply 1 application topically daily as needed (itching).   2  . furosemide (LASIX) 20 MG tablet Take 1 tablet (20 mg total) by mouth daily. 30 tablet 3  . hydrocortisone (ANUSOL-HC) 2.5 % rectal cream Place 1 application rectally 2 (two) times daily as needed for hemorrhoids or itching. 30 g 1  . hydrocortisone cream 1 % Apply 1 application topically 2 (two) times daily as needed (for eczmea).    Marland Kitchen ipratropium (ATROVENT) 0.06 % nasal spray Place 2  sprays into both nostrils 4 (four) times daily. 15 mL 3  . irbesartan (AVAPRO) 300 MG tablet TAKE 1 TABLET(300 MG) BY MOUTH AT BEDTIME 90 tablet 2  . isosorbide mononitrate (IMDUR) 30 MG 24 hr tablet TAKE 1/2 TABLET(15 MG) BY MOUTH DAILY 30 tablet 6  . latanoprost (XALATAN) 0.005 % ophthalmic solution Place 1 drop into both eyes at bedtime.    Marland Kitchen loratadine (CLARITIN) 10 MG tablet Take 10 mg by mouth daily.    . meclizine (ANTIVERT) 25 MG tablet Take 25 mg by mouth 3 (three) times daily as needed for dizziness.    . nitroGLYCERIN (NITROLINGUAL) 0.4 MG/SPRAY spray Place 1 spray under the tongue every 5 (five) minutes x 3 doses as needed for chest pain. 12 g 3  . Omega-3 Fatty Acids (FISH OIL) 1000 MG CAPS Take 1,000 mg by mouth 2 (two) times daily.     . pseudoephedrine (SUDAFED) 60 MG tablet Take 60 mg by mouth every 4 (four) hours as needed for congestion.    Marland Kitchen spironolactone (ALDACTONE) 25 MG tablet Take 1 tablet (25 mg total) by mouth daily. 90 tablet 6  . Tamsulosin HCl (FLOMAX) 0.4 MG CAPS Take 0.4 mg by mouth daily.    Marland Kitchen triamcinolone cream (KENALOG) 0.1 % Apply 1 application topically 2 (two) times daily. Eczema  3  . warfarin (COUMADIN) 2 MG tablet TAKE AS DIRECTED BY COUMADIN CLINIC 150 tablet 0   Current Facility-Administered Medications  Medication Dose Route Frequency Provider Last Rate Last Dose  . 0.9 %  sodium chloride infusion  500 mL Intravenous Continuous Danis, Kirke Corin, MD        No Known Allergies  Social History   Social History  . Marital status: Divorced    Spouse name: N/A  . Number of children: 0  . Years of education: master's   Occupational History  . semi-retired    Social History Main Topics  . Smoking status: Former Smoker    Quit date: 08/22/1983  . Smokeless tobacco: Never Used  . Alcohol use No  . Drug use: No  . Sexual activity: Not on file   Other Topics Concern  . Not on file   Social History Narrative   Patient drinks 1-2 cups of  caffeine daily.   Patient is right handed.     Review of Systems: General: negative for chills, fever, night sweats or weight changes.  Cardiovascular: negative for chest pain, dyspnea on exertion, edema, orthopnea, palpitations, paroxysmal nocturnal dyspnea or shortness of breath Dermatological: negative for rash Respiratory: negative for cough or wheezing Urologic: negative for hematuria Abdominal: negative for nausea, vomiting, diarrhea, bright red blood per rectum, melena, or hematemesis Neurologic: negative for visual changes, syncope, or dizziness All other systems reviewed and are otherwise negative except as noted above.  Blood pressure (!) 150/72, pulse 90, height 5\' 9"  (1.753 m), weight 204 lb (92.5  kg).  General appearance: alert, cooperative and no distress Neck: no carotid bruit and no JVD Lungs: clear to auscultation bilaterally Heart: regular rate and rhythm, S1, S2 normal, no murmur, click, rub or gallop Extremities: Mild bilateral pitting LEE Pulses: 2+ and symmetric Skin: warm and dry Neurologic: Grossly normal  EKG performed today 02/22/2017 was personally reviewed and it shows atrial fibrillation with negative T waves in the inferolateral leads they're unchanged from prior.     ASSESSMENT AND PLAN:   1. CAD: s/p recent LHC in 07/2015 with circumflex anatomy with 50-60% stenosis in the RCA, and 70-75% stenosis in the distal circumflex artery. Recommendations were to manage medically as risk for triple therapy with PCI would be greater than benefit, given the territory supplied, distal to the area is very small. He continues to have stable angina with moderate activity. His symptoms have improved in the last year. EKG is unchanged, his able to perform her activities of daily living, we'll continue the same medical management.  2. PAF: rate is well controlled. Asymptomatic. On a/c with Coumadin. INRs followed in our coumadin clinic. He denies any abnormal bleeding.  Hemoglobin check these week 12.9.  3. Chronic Systolic HF: He has mild lower extremity edema, he ran out of Lasix we will restart 20 mg daily.   4. HTN: BP is elevated, rechecked 138/86, we'll add Lasix 20 mg daily.  5. Hyperlipidemia, on atorvastatin and omega-3 acids, all lipids at goal.  Labs were reviewed, he has normal electrolytes, kidney and liver function, all lipids are at goal, normal TSH normal hemoglobin. Elevated blood glucose.   Follow-up in 4 months.  Ena Dawley, MD 02/22/2017 11:45 AM

## 2017-03-05 ENCOUNTER — Emergency Department (HOSPITAL_COMMUNITY)
Admission: EM | Admit: 2017-03-05 | Discharge: 2017-03-05 | Disposition: A | Discharge status: Home / Self Care | Payer: Medicare Other | Attending: Emergency Medicine | Admitting: Emergency Medicine

## 2017-03-05 ENCOUNTER — Encounter (HOSPITAL_COMMUNITY): Payer: Self-pay | Admitting: Emergency Medicine

## 2017-03-05 DIAGNOSIS — Z7901 Long term (current) use of anticoagulants: Secondary | ICD-10-CM | POA: Insufficient documentation

## 2017-03-05 DIAGNOSIS — R300 Dysuria: Secondary | ICD-10-CM

## 2017-03-05 DIAGNOSIS — J449 Chronic obstructive pulmonary disease, unspecified: Secondary | ICD-10-CM | POA: Insufficient documentation

## 2017-03-05 DIAGNOSIS — I509 Heart failure, unspecified: Secondary | ICD-10-CM | POA: Diagnosis not present

## 2017-03-05 DIAGNOSIS — Z87891 Personal history of nicotine dependence: Secondary | ICD-10-CM | POA: Insufficient documentation

## 2017-03-05 DIAGNOSIS — Z79899 Other long term (current) drug therapy: Secondary | ICD-10-CM | POA: Diagnosis not present

## 2017-03-05 DIAGNOSIS — I2510 Atherosclerotic heart disease of native coronary artery without angina pectoris: Secondary | ICD-10-CM | POA: Diagnosis not present

## 2017-03-05 DIAGNOSIS — Z7982 Long term (current) use of aspirin: Secondary | ICD-10-CM | POA: Insufficient documentation

## 2017-03-05 DIAGNOSIS — I110 Hypertensive heart disease with heart failure: Secondary | ICD-10-CM | POA: Diagnosis not present

## 2017-03-05 DIAGNOSIS — Z8673 Personal history of transient ischemic attack (TIA), and cerebral infarction without residual deficits: Secondary | ICD-10-CM | POA: Diagnosis not present

## 2017-03-05 LAB — URINALYSIS, ROUTINE W REFLEX MICROSCOPIC
Bilirubin Urine: NEGATIVE
Glucose, UA: NEGATIVE mg/dL
Hgb urine dipstick: NEGATIVE
Ketones, ur: NEGATIVE mg/dL
Leukocytes, UA: NEGATIVE
Nitrite: NEGATIVE
Protein, ur: NEGATIVE mg/dL
Specific Gravity, Urine: 1.005 (ref 1.005–1.030)
pH: 6 (ref 5.0–8.0)

## 2017-03-05 NOTE — ED Provider Notes (Signed)
[facility] DEPT Provider Note   CSN: [id number] Arrival date & time: 03/05/17  1001     History   Chief Complaint Chief Complaint  Patient presents with  . Urinary Tract Infection    Patient has been having pressure and pain in his penis when he urinates.  Denies blood.    HPI Vincent [name] is a 81 y.o. male.  HPI Patient presents with pain with urination. States the pain is sort of at the end of his penis. Began a couple days ago. No blood in the urine. No fevers. No flank pain. States he does not feel bad except for the penile pain. No testicular pain. No penile discharge. Patient states there is no chance for an STD.  Past Medical History:  Diagnosis Date  . Alcoholic cardiomyopathy ([place])   . Atrial fibrillation ([place])   . Bradycardia   . CHF (congestive heart failure) ([place])   . 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 COPD (chronic obstructive pulmonary disease) ([place])   . Coronary artery disease   . Foot drop, left 03/11/2015  . Gait disorder 03/11/2015  . GERD (gastroesophageal reflux disease)   . Glaucoma   . Gout   . Hemiparesis and alteration of sensations as late effects of stroke ([place]) 09/25/2015  . Hyperlipidemia   . Hypertension   . Long term (current) use of anticoagulants   . 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 Pulmonary eosinophilia ([place])   . Stroke ([place])   . Syncope and collapse   . Unspecified glaucoma(365.9)   . V-[facility]ach [facility])     Patient Active Problem List   Diagnosis Date Noted  . Chest pain 11/25/2015  . 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 Hemiparesis and alteration of sensations as late effects of stroke ([place]) 09/25/2015  . Abnormal stress test 07/21/2015  . Foot drop, left 03/11/2015  . Gait disorder 03/11/2015  . Precordial pain 03/19/2014  . Dizziness 03/17/2014  . Near syncope 03/17/2014  . Encounter for therapeutic drug monitoring 09/24/2013  . Rectal bleeding 10/19/2012  .  External hemorrhoid, bleeding 10/19/2012  . Long term current use of anticoagulant 10/12/2010  . GLAUCOMA 09/17/2010  . GERD 09/17/2010  . HEMORRHAGE OF RECTUM AND ANUS 09/17/2010  . BRADYCARDIA 06/09/2010  . VENTRICULAR TACHYCARDIA 12/30/2008  . SYNCOPE 12/30/2008  . PULMONARY INFILTRATE INCLUDES (EOSINOPHILIA) 12/28/2007  . HLD (hyperlipidemia) 12/27/2007  . Essential hypertension 12/27/2007  . Coronary atherosclerosis 12/27/2007  . Alcoholic cardiomyopathy ([place]) 12/27/2007  . PAROXYSMAL ATRIAL FIBRILLATION 12/27/2007  . Cerebral artery occlusion with cerebral infarction ([place]) 12/27/2007  . COUGH 12/27/2007    Past Surgical History:  Procedure Laterality Date  . CARDIAC CATHETERIZATION N/A 07/30/2015   Procedure: Left Heart Cath and Coronary Angiography;  Surgeon: Belva Crome, MD;  Location: [facility];  Service: Cardiovascular;  Laterality: N/A;  . COLONOSCOPY    . KNEE SURGERY    . loop recorder  09/2007       Home Medications    Prior to Admission medications   Medication Sig Start Date End Date Taking? Authorizing Provider  aspirin 81 MG chewable tablet Chew 81 mg by mouth daily.    [provider]  atorvastatin (LIPITOR) 40 MG tablet Take 1 tablet (40 mg total) by mouth daily. 08/02/16   [name], MD  brimonidine (ALPHAGAN) 0.2 % ophthalmic solution Place 1 drop into both eyes 2 (two) times daily. 07/30/16  [provider]  carvedilol (COREG) 6.25 MG tablet Take 1 tablet (6.25 mg total) by mouth 2 (two) times daily. 04/27/16   [name], MD  colchicine 0.6 MG tablet Take 0.6 mg by mouth daily.     [provider]  famotidine (PEPCID) 20 MG tablet Take 20 mg by mouth 2 (two) times daily.    [provider]  ferrous sulfate 325 (65 FE) MG tablet Take 325 mg by mouth daily with breakfast.    [provider]  fluocinonide cream (LIDEX) 7.12 % Apply 1 application topically daily as needed (itching).   01/13/15   [provider]  furosemide (LASIX) 20 MG tablet Take 1 tablet (20 mg total) by mouth daily. 02/22/17   [name], MD  hydrocortisone (ANUSOL-HC) 2.5 % rectal cream Place 1 application rectally 2 (two) times daily as needed for hemorrhoids or itching. 07/27/16   Levin Erp, PA  hydrocortisone cream 1 % Apply 1 application topically 2 (two) times daily as needed (for eczmea).    [provider]  ipratropium (ATROVENT) 0.06 % nasal spray Place 2 sprays into both nostrils 4 (four) times daily. 11/21/16   [name], NP  irbesartan (AVAPRO) 300 MG tablet TAKE 1 TABLET(300 MG) BY MOUTH AT BEDTIME 07/25/16   [name], MD  isosorbide mononitrate (IMDUR) 30 MG 24 hr tablet TAKE 1/2 TABLET(15 MG) BY MOUTH DAILY 02/13/17   [name], MD  latanoprost (XALATAN) 0.005 % ophthalmic solution Place 1 drop into both eyes at bedtime.    [provider]  loratadine (CLARITIN) 10 MG tablet Take 10 mg by mouth daily.    [provider]  meclizine (ANTIVERT) 25 MG tablet Take 25 mg by mouth 3 (three) times daily as needed for dizziness.    [provider]  nitroGLYCERIN (NITROLINGUAL) 0.4 MG/SPRAY spray Place 1 spray under the tongue every 5 (five) minutes x 3 doses as needed for chest pain. 10/18/16   [name], MD  Omega-3 Fatty Acids (FISH OIL) 1000 MG CAPS Take 1,000 mg by mouth 2 (two) times daily.     [provider]  pseudoephedrine (SUDAFED) 60 MG tablet Take 60 mg by mouth every 4 (four) hours as needed for congestion.    [provider]  spironolactone (ALDACTONE) 25 MG tablet Take 1 tablet (25 mg total) by mouth daily. 04/27/16   [name], MD  Tamsulosin HCl (FLOMAX) 0.4 MG CAPS Take 0.4 mg by mouth daily. 12/28/11   [provider]  triamcinolone cream (KENALOG) 0.1 % Apply 1 application topically 2 (two) times daily. Eczema 01/20/15   [provider]  warfarin  (COUMADIN) 2 MG tablet TAKE AS DIRECTED BY COUMADIN CLINIC 12/13/16   [name], MD    Family History Family History  Problem Relation Age of Onset  . Colon cancer Mother   . Hypertension Mother   . Cancer Mother   . Prostate cancer Brother   . Cancer Brother   . Heart disease Father   . Heart attack Father   . Hypertension Father   . Heart attack Sister   . Cancer Sister   . Stroke Neg Hx   . Colon polyps Neg Hx   . Esophageal cancer Neg Hx   . Stomach cancer Neg Hx   . Rectal cancer Neg Hx   . Pancreatic cancer Neg Hx     Social History Social History  Substance Use Topics  .  Smoking status: Former Smoker    Quit date: 08/22/1983  . Smokeless tobacco: Never Used  . Alcohol use No     Allergies   Patient has no known allergies.   Review of Systems Review of Systems  Constitutional: Negative for appetite change.  Respiratory: Negative for shortness of breath.   Cardiovascular: Positive for chest pain.  Gastrointestinal: Negative for anal bleeding.  Endocrine: Negative for polyuria.  Genitourinary: Positive for dysuria. Negative for difficulty urinating, discharge, frequency, genital sores, penile pain, penile swelling and testicular pain.  Musculoskeletal: Negative for back pain.  Hematological: Bruises/bleeds easily.     Physical Exam Updated Vital Signs BP 122/90   Pulse 63   Temp 98 F (36.7 C) (Oral)   Resp 19   SpO2 99%   Physical Exam  Constitutional: He appears well-developed.  HENT:  Head: Atraumatic.  Cardiovascular: Normal rate.   Abdominal: There is no tenderness.  Genitourinary:  Genitourinary Comments: No CVA tenderness. No penile discharge. Patient is circumcised. No testicular tenderness or mass.  Musculoskeletal: He exhibits no edema.  Skin: Skin is warm.     ED Treatments / Results  Labs (all labs ordered are listed, but only abnormal results are displayed) Labs Reviewed  URINALYSIS, ROUTINE W REFLEX MICROSCOPIC -  Abnormal; Notable for the following:       Result Value   Color, Urine STRAW (*)    All other components within normal limits    EKG  EKG Interpretation None       Radiology No results found.  Procedures Procedures (including critical care time)  Medications Ordered in ED Medications - No data to display   Initial Impression / Assessment and Plan / ED Course  I have reviewed the triage vital signs and the nursing notes.  Pertinent labs & imaging results that were available during my care of the patient were reviewed by me and considered in my medical decision making (see chart for details).     Patient presents with dysuria. Had for last couple days. Urine does not show infection. Rectal exam attempted but has large hemorrhoid surgery painful. Had had no perineal tenderness. No abdominal tenderness. Discharge home to follow-up with urology as needed. Doubt prostatitis. Denies discharge or risky sexual behaviors.  Final Clinical Impressions(s) / ED Diagnoses   Final diagnoses:  Dysuria    New Prescriptions New Prescriptions   No medications on file     Davonna Belling, MD 03/05/17 1242

## 2017-03-05 NOTE — ED Triage Notes (Signed)
Patient has been having pain, burning and pressure in his penis upon urination.  He denies blood in urine or any other symptoms.  Rates his pain 6/10.

## 2017-03-05 NOTE — ED Notes (Signed)
Pt informed a urine sample is needed. Pt given a urinal and encouraged to inform staff when he can urinate.

## 2017-04-06 ENCOUNTER — Ambulatory Visit (INDEPENDENT_AMBULATORY_CARE_PROVIDER_SITE_OTHER): Payer: Medicare Other

## 2017-04-06 DIAGNOSIS — I482 Chronic atrial fibrillation, unspecified: Secondary | ICD-10-CM

## 2017-04-06 DIAGNOSIS — Z5181 Encounter for therapeutic drug level monitoring: Secondary | ICD-10-CM

## 2017-04-06 DIAGNOSIS — I6350 Cerebral infarction due to unspecified occlusion or stenosis of unspecified cerebral artery: Secondary | ICD-10-CM | POA: Diagnosis not present

## 2017-04-06 LAB — POCT INR: INR: 1.6

## 2017-04-14 ENCOUNTER — Telehealth: Payer: Self-pay | Admitting: *Deleted

## 2017-04-14 NOTE — Telephone Encounter (Signed)
Patient called and requested a refill on isosorbide. He stated that he has been running out to soon as his rx was sent in for one-half tablet qd on 02/13/17. Patient states that he has been taking a whole tablet for one year or more as he was instructed to do at his office visit with Lyda Jester on 01/18/16. His recent office visit with Dr Meda Coffee on 02/22/17 reflects the one-half tablet qd. Please advise. Thanks, MI

## 2017-04-14 NOTE — Telephone Encounter (Signed)
Dr Meda Coffee do you want this pt to take Imdur 30 mg once daily, or take Imdur 15 mg once daily.  Pt has been taking 30 mg daily, but has been taking 1 whole pill vs 1/2 pill.  In his med list this was decreased by our CMA at the last OV from 30 mg to 15 mg, and I see no notation that you ever ordered for this to be decreased.  Pt wants to take Imdur 30 mg PO daily.  Fill for 30 mg daily?

## 2017-04-18 NOTE — Telephone Encounter (Signed)
Vincent [name], Dr. Meda Coffee wants the pt to take Imdur 15 mg po daily.  Can you please call this in for the pt? Thanks!

## 2017-04-18 NOTE — Telephone Encounter (Signed)
Left msg on vm for patient to call the office. 

## 2017-04-18 NOTE — Telephone Encounter (Signed)
Imdur 15 mg po daily

## 2017-04-19 NOTE — Telephone Encounter (Signed)
Patient returned my call and I have made him aware that per Dr Meda Coffee, he should only be taking 15 mg of imdur. Patient has refills remaining at his pharmacy for this dose. He verbalized his understanding and will notify us if he has any other problems or concerns.

## 2017-04-20 ENCOUNTER — Ambulatory Visit (INDEPENDENT_AMBULATORY_CARE_PROVIDER_SITE_OTHER): Payer: Medicare Other | Admitting: *Deleted

## 2017-04-20 DIAGNOSIS — I6350 Cerebral infarction due to unspecified occlusion or stenosis of unspecified cerebral artery: Secondary | ICD-10-CM | POA: Diagnosis not present

## 2017-04-20 DIAGNOSIS — I482 Chronic atrial fibrillation, unspecified: Secondary | ICD-10-CM

## 2017-04-20 DIAGNOSIS — Z5181 Encounter for therapeutic drug level monitoring: Secondary | ICD-10-CM | POA: Diagnosis not present

## 2017-04-20 LAB — POCT INR: INR: 2

## 2017-05-11 ENCOUNTER — Ambulatory Visit (INDEPENDENT_AMBULATORY_CARE_PROVIDER_SITE_OTHER): Payer: Medicare Other | Admitting: *Deleted

## 2017-05-11 DIAGNOSIS — I6350 Cerebral infarction due to unspecified occlusion or stenosis of unspecified cerebral artery: Secondary | ICD-10-CM | POA: Diagnosis not present

## 2017-05-11 DIAGNOSIS — Z5181 Encounter for therapeutic drug level monitoring: Secondary | ICD-10-CM | POA: Diagnosis not present

## 2017-05-11 DIAGNOSIS — I482 Chronic atrial fibrillation, unspecified: Secondary | ICD-10-CM

## 2017-05-11 LAB — POCT INR: INR: 2.8

## 2017-05-19 ENCOUNTER — Other Ambulatory Visit: Payer: Self-pay | Admitting: Cardiology

## 2017-05-19 DIAGNOSIS — I10 Essential (primary) hypertension: Secondary | ICD-10-CM

## 2017-05-19 DIAGNOSIS — I2510 Atherosclerotic heart disease of native coronary artery without angina pectoris: Secondary | ICD-10-CM

## 2017-05-19 DIAGNOSIS — I5023 Acute on chronic systolic (congestive) heart failure: Secondary | ICD-10-CM

## 2017-05-19 DIAGNOSIS — I4821 Permanent atrial fibrillation: Secondary | ICD-10-CM

## 2017-05-19 DIAGNOSIS — I209 Angina pectoris, unspecified: Secondary | ICD-10-CM

## 2017-05-19 DIAGNOSIS — I2583 Coronary atherosclerosis due to lipid rich plaque: Secondary | ICD-10-CM

## 2017-05-19 DIAGNOSIS — Z01812 Encounter for preprocedural laboratory examination: Secondary | ICD-10-CM

## 2017-05-19 DIAGNOSIS — R9439 Abnormal result of other cardiovascular function study: Secondary | ICD-10-CM

## 2017-05-19 NOTE — Telephone Encounter (Deleted)
Medication Detail    Disp Refills Start End   isosorbide mononitrate (IMDUR) 30 MG 24 hr tablet 30 tablet 6 02/13/2017    Sig: TAKE 1/2 TABLET(15 MG) BY MOUTH DAILY   Sent to pharmacy as: isosorbide mononitrate (IMDUR) 30 MG 24 hr tablet   E-Prescribing Status: Receipt confirmed by pharmacy (02/13/2017 2:39 PM EDT)   Pharmacy   [id number] - [place], [place] [address] AT [address]

## 2017-06-12 DIAGNOSIS — N138 Other obstructive and reflux uropathy: Secondary | ICD-10-CM | POA: Diagnosis not present

## 2017-06-12 DIAGNOSIS — N5201 Erectile dysfunction due to arterial insufficiency: Secondary | ICD-10-CM | POA: Diagnosis not present

## 2017-06-14 ENCOUNTER — Encounter: Payer: Self-pay | Admitting: Cardiology

## 2017-06-15 DIAGNOSIS — Z23 Encounter for immunization: Secondary | ICD-10-CM | POA: Diagnosis not present

## 2017-06-22 ENCOUNTER — Ambulatory Visit (INDEPENDENT_AMBULATORY_CARE_PROVIDER_SITE_OTHER): Payer: Medicare Other | Admitting: *Deleted

## 2017-06-22 DIAGNOSIS — I482 Chronic atrial fibrillation, unspecified: Secondary | ICD-10-CM

## 2017-06-22 DIAGNOSIS — Z5181 Encounter for therapeutic drug level monitoring: Secondary | ICD-10-CM

## 2017-06-22 DIAGNOSIS — I6350 Cerebral infarction due to unspecified occlusion or stenosis of unspecified cerebral artery: Secondary | ICD-10-CM

## 2017-06-22 LAB — POCT INR: INR: 2.1

## 2017-06-28 ENCOUNTER — Ambulatory Visit (INDEPENDENT_AMBULATORY_CARE_PROVIDER_SITE_OTHER): Payer: Medicare Other | Admitting: Cardiology

## 2017-06-28 ENCOUNTER — Encounter: Payer: Self-pay | Admitting: Cardiology

## 2017-06-28 VITALS — BP 130/64 | HR 57 | Ht 69.0 in | Wt 209.0 lb

## 2017-06-28 DIAGNOSIS — I10 Essential (primary) hypertension: Secondary | ICD-10-CM

## 2017-06-28 DIAGNOSIS — I480 Paroxysmal atrial fibrillation: Secondary | ICD-10-CM

## 2017-06-28 DIAGNOSIS — I5022 Chronic systolic (congestive) heart failure: Secondary | ICD-10-CM

## 2017-06-28 DIAGNOSIS — I2510 Atherosclerotic heart disease of native coronary artery without angina pectoris: Secondary | ICD-10-CM | POA: Diagnosis not present

## 2017-06-28 DIAGNOSIS — E7849 Other hyperlipidemia: Secondary | ICD-10-CM

## 2017-06-28 MED ORDER — ISOSORBIDE MONONITRATE ER 30 MG PO TB24: 30.0000 mg | ORAL_TABLET | Freq: Every day | ORAL | 3 refills | Status: DC

## 2017-06-28 NOTE — Patient Instructions (Signed)
Medication Instructions:  Your physician has recommended you make the following change in your medication:   INCREASE: your Imdur to 30 mg daily   Labwork: None ordered   Testing/Procedures: None ordered   Follow-Up: Your physician wants you to follow-up in: 6 months with Dr. Meda Coffee. You will receive a reminder letter in the mail two months in advance. If you don't receive a letter, please call our office to schedule the follow-up appointment.  Any Other Special Instructions Will Be Listed Below (If Applicable).     If you need a refill on your cardiac medications before your next appointment, please call your pharmacy.

## 2017-06-28 NOTE — Progress Notes (Signed)
06/28/2017 Vincent [name]   1936-02-25  [id number]  Primary Physician Patient, No Pcp Per Primary Cardiologist: Dr. Meda Coffee  Reason for Visit/CC: F/u for CAD  HPI:  81 y/o male, followed by Dr. Meda Coffee, who presents to clinic for f/u. He has h/o CAD, chronic systolic HF/ alcoholic cardiomyopathy, PAF that is rate conrolled on chronic coumadin as well as h/o HTN and prior h/o CVA. In 06/2015 he underwent a NST for CP evaluation. This was an abnormal study. Subsequent LHC per Dr. [name] 12/16 demonstrated patent previously placed mid RCA stent with 50-60% narrowing distal to the stent, unchanged from prior cath in 2006. There was also a codominant left coronary system with luminal irregularities in LAD and distal circumflex 70-75% stenosis before the origin of a small branching obtuse marginal. The distal circumflex disease has progressed since 2006. The territory supplied beyond the stenosis is small. EF was 35%. Dr. [name] felt that the circumflex would be best managed medically, given the territory supplied by this vessel is small and stenting would have required triple therapy and risk of bleeding would be greater than the benefit achieved by stenting this relatively limited territory. He was treated medically.    He was seen by Dr. Meda Coffee 10/2015. At that time, he seemed very concern regarding medical management of his circumflex lesion.  He was very anxious given 2 of his sisters died secondary to heart attacks. Imdur was added. Dr. Meda Coffee instructed him to f/u in 2 months to reassess. If still symptomatic, she would reconsider PCI of the circumflex.   He presents back for f/u. He continues to have exertional CP with moderate physical activity. This occurs ~2-3 times a week and resolves with rest. It is improved some with addition of Imdur. He is only taking a low dose, 15 mg daily. He is tolerating well w/o HA or other side effects. BP is stable. No resting chest pain and no dyspnea.   10/18/2016 -  is a 6 months follow-up, the patient states that he has been feeling about the same, he gets chest pain on moderate exertion such as walking up the hill but not more often than once a months. He denies any worsening dyspnea on exertion, he is being compliant with his medication denies any muscle pain or lower extremity edema. He also denies any paroxysmal nocturnal dyspnea falls or syncope. He needs refills for sublingual nitroglycerin.  02/22/2017, the patient states that he is feeling about the same, he gets chest pain approximately twice a week when he walks up the hill, this resolve at rest sitting 5-10 minutes. This has been stable over the last 2 years. He has been compliant with his medications, denies any muscle pain with atorvastatin and no bleeding with warfarin.  06/28/2017 - 6 months follow up, the patient has stable symptoms, very occasional angina, no palpitations, no dizziness, no syncope. No bleeding with warfarin, he has hard time splitting Imdur in half. No dizziness.   Current Outpatient Prescriptions  Medication Sig Dispense Refill  . aspirin 81 MG chewable tablet Chew 81 mg by mouth daily.    Marland Kitchen atorvastatin (LIPITOR) 40 MG tablet Take 1 tablet (40 mg total) by mouth daily. 90 tablet 2  . brimonidine (ALPHAGAN) 0.2 % ophthalmic solution Place 1 drop into both eyes 2 (two) times daily.  5  . carvedilol (COREG) 6.25 MG tablet Take 1 tablet (6.25 mg total) by mouth 2 (two) times daily. 180 tablet 6  . colchicine 0.6 MG tablet Take  0.6 mg by mouth daily.     . famotidine (PEPCID) 20 MG tablet Take 20 mg by mouth 2 (two) times daily.    . ferrous sulfate 325 (65 FE) MG tablet Take 325 mg by mouth daily with breakfast.    . fluocinonide cream (LIDEX) 4.13 % Apply 1 application topically daily as needed (itching).   2  . furosemide (LASIX) 20 MG tablet Take 1 tablet (20 mg total) by mouth daily. 90 tablet 2  . hydrocortisone (ANUSOL-HC) 2.5 % rectal cream Place 1 application rectally 2  (two) times daily as needed for hemorrhoids or itching. 30 g 1  . hydrocortisone cream 1 % Apply 1 application topically 2 (two) times daily as needed (for eczmea).    Marland Kitchen ipratropium (ATROVENT) 0.06 % nasal spray Place 2 sprays into both nostrils 4 (four) times daily. 15 mL 3  . irbesartan (AVAPRO) 300 MG tablet TAKE 1 TABLET BY MOUTH EVERY NIGHT AT BEDTIME 90 tablet 2  . isosorbide mononitrate (IMDUR) 30 MG 24 hr tablet TAKE 1/2 TABLET(15 MG) BY MOUTH DAILY 30 tablet 6  . latanoprost (XALATAN) 0.005 % ophthalmic solution Place 1 drop into both eyes at bedtime.    Marland Kitchen loratadine (CLARITIN) 10 MG tablet Take 10 mg by mouth daily.    . meclizine (ANTIVERT) 25 MG tablet Take 25 mg by mouth 3 (three) times daily as needed for dizziness.    . nitroGLYCERIN (NITROLINGUAL) 0.4 MG/SPRAY spray Place 1 spray under the tongue every 5 (five) minutes x 3 doses as needed for chest pain. 12 g 3  . Omega-3 Fatty Acids (FISH OIL) 1000 MG CAPS Take 1,000 mg by mouth 2 (two) times daily.     . pseudoephedrine (SUDAFED) 60 MG tablet Take 60 mg by mouth every 4 (four) hours as needed for congestion.    Marland Kitchen spironolactone (ALDACTONE) 25 MG tablet Take 1 tablet (25 mg total) by mouth daily. 90 tablet 6  . Tamsulosin HCl (FLOMAX) 0.4 MG CAPS Take 0.4 mg by mouth daily.    Marland Kitchen triamcinolone cream (KENALOG) 0.1 % Apply 1 application topically 2 (two) times daily. Eczema  3  . warfarin (COUMADIN) 2 MG tablet TAKE AS DIRECTED BY COUMADIN CLINIC 150 tablet 0   Current Facility-Administered Medications  Medication Dose Route Frequency Provider Last Rate Last Dose  . 0.9 %  sodium chloride infusion  500 mL Intravenous Continuous Danis, Kirke Corin, MD       No Known Allergies  Social History   Social History  . Marital status: Divorced    Spouse name: N/A  . Number of children: 0  . Years of education: master's   Occupational History  . semi-retired    Social History Main Topics  . Smoking status: Former Smoker    Quit  date: 08/22/1983  . Smokeless tobacco: Never Used  . Alcohol use No  . Drug use: No  . Sexual activity: Not on file   Other Topics Concern  . Not on file   Social History Narrative   Patient drinks 1-2 cups of caffeine daily.   Patient is right handed.     Review of Systems: General: negative for chills, fever, night sweats or weight changes.  Cardiovascular: negative for chest pain, dyspnea on exertion, edema, orthopnea, palpitations, paroxysmal nocturnal dyspnea or shortness of breath Dermatological: negative for rash Respiratory: negative for cough or wheezing Urologic: negative for hematuria Abdominal: negative for nausea, vomiting, diarrhea, bright red blood per rectum, melena, or hematemesis  Neurologic: negative for visual changes, syncope, or dizziness All other systems reviewed and are otherwise negative except as noted above.  Blood pressure 130/64, pulse (!) 57, height 5\' 9"  (1.753 m), weight 209 lb (94.8 kg), SpO2 98 %.  General appearance: alert, cooperative and no distress Neck: no carotid bruit and no JVD Lungs: clear to auscultation bilaterally Heart: regular rate and rhythm, S1, S2 normal, no murmur, click, rub or gallop Extremities: Mild bilateral pitting LEE Pulses: 2+ and symmetric Skin: warm and dry Neurologic: Grossly normal  EKG performed today 02/22/2017 was personally reviewed and it shows atrial fibrillation with negative T waves in the inferolateral leads they're unchanged from prior.     ASSESSMENT AND PLAN:   1. CAD: s/p recent LHC in 07/2015 with circumflex anatomy with 50-60% stenosis in the RCA, and 70-75% stenosis in the distal circumflex artery. Recommendations were to manage medically as risk for triple therapy with PCI would be greater than benefit, given the territory supplied, distal to the area is very small.  His symptoms are stable, I will increase imdur to 30 mg po daily.   2. PAF: rate is well controlled. Asymptomatic. On a/c with  Coumadin. INRs followed in our coumadin clinic.Normal Hb, no bleeding.  3. Chronic Systolic HF: He has mild lower extremity edema, continue 20 mg daily. Stable normal Crea.  4. HTN: controlled.  5. Hyperlipidemia, on atorvastatin 40 mg po daily and omega-3 acids, all lipids at goal. Tolerating well, no memory problems.  Follow-up in 6 months.  Ena Dawley, MD 06/28/2017 11:04 AM

## 2017-07-23 ENCOUNTER — Other Ambulatory Visit: Payer: Self-pay | Admitting: Cardiology

## 2017-07-23 DIAGNOSIS — I10 Essential (primary) hypertension: Secondary | ICD-10-CM

## 2017-07-23 DIAGNOSIS — E785 Hyperlipidemia, unspecified: Secondary | ICD-10-CM

## 2017-07-23 DIAGNOSIS — Z5181 Encounter for therapeutic drug level monitoring: Secondary | ICD-10-CM

## 2017-07-23 DIAGNOSIS — I482 Chronic atrial fibrillation, unspecified: Secondary | ICD-10-CM

## 2017-07-27 ENCOUNTER — Ambulatory Visit: Payer: Medicare Other | Admitting: *Deleted

## 2017-07-27 DIAGNOSIS — I482 Chronic atrial fibrillation, unspecified: Secondary | ICD-10-CM

## 2017-07-27 DIAGNOSIS — I6350 Cerebral infarction due to unspecified occlusion or stenosis of unspecified cerebral artery: Secondary | ICD-10-CM | POA: Diagnosis not present

## 2017-07-27 DIAGNOSIS — Z5181 Encounter for therapeutic drug level monitoring: Secondary | ICD-10-CM | POA: Diagnosis not present

## 2017-07-27 LAB — POCT INR: INR: 2.3

## 2017-07-27 NOTE — Patient Instructions (Signed)
Continue taking 2 tablets everyday except 1 tablet on Mondays, Wednesdays, and Fridays.  Recheck in 6 weeks. Call our office if you have any concerns [phone].

## 2017-09-07 ENCOUNTER — Ambulatory Visit: Payer: Medicare Other | Admitting: *Deleted

## 2017-09-07 DIAGNOSIS — Z5181 Encounter for therapeutic drug level monitoring: Secondary | ICD-10-CM | POA: Diagnosis not present

## 2017-09-07 DIAGNOSIS — I482 Chronic atrial fibrillation, unspecified: Secondary | ICD-10-CM

## 2017-09-07 DIAGNOSIS — I6350 Cerebral infarction due to unspecified occlusion or stenosis of unspecified cerebral artery: Secondary | ICD-10-CM

## 2017-09-07 LAB — POCT INR: INR: 2

## 2017-09-07 NOTE — Patient Instructions (Signed)
Description   Continue taking 2 tablets every day except 1 tablet on Mondays, Wednesdays, and Fridays.  Recheck in 6 weeks. Call our office if you have any concerns [phone].     

## 2017-09-11 ENCOUNTER — Other Ambulatory Visit: Payer: Self-pay | Admitting: Cardiology

## 2017-09-19 ENCOUNTER — Other Ambulatory Visit: Payer: Self-pay | Admitting: Cardiology

## 2017-09-27 DIAGNOSIS — H2512 Age-related nuclear cataract, left eye: Secondary | ICD-10-CM | POA: Diagnosis not present

## 2017-09-27 DIAGNOSIS — H401133 Primary open-angle glaucoma, bilateral, severe stage: Secondary | ICD-10-CM | POA: Diagnosis not present

## 2017-10-07 ENCOUNTER — Other Ambulatory Visit: Payer: Self-pay | Admitting: Cardiology

## 2017-10-07 DIAGNOSIS — I10 Essential (primary) hypertension: Secondary | ICD-10-CM

## 2017-10-07 DIAGNOSIS — E785 Hyperlipidemia, unspecified: Secondary | ICD-10-CM

## 2017-10-07 DIAGNOSIS — I482 Chronic atrial fibrillation, unspecified: Secondary | ICD-10-CM

## 2017-10-07 DIAGNOSIS — Z5181 Encounter for therapeutic drug level monitoring: Secondary | ICD-10-CM

## 2017-10-19 ENCOUNTER — Ambulatory Visit: Payer: Medicare Other | Admitting: *Deleted

## 2017-10-19 DIAGNOSIS — I482 Chronic atrial fibrillation, unspecified: Secondary | ICD-10-CM

## 2017-10-19 DIAGNOSIS — Z5181 Encounter for therapeutic drug level monitoring: Secondary | ICD-10-CM | POA: Diagnosis not present

## 2017-10-19 DIAGNOSIS — I6350 Cerebral infarction due to unspecified occlusion or stenosis of unspecified cerebral artery: Secondary | ICD-10-CM | POA: Diagnosis not present

## 2017-10-19 LAB — POCT INR: INR: 2

## 2017-10-19 NOTE — Patient Instructions (Signed)
Description   Today take 2.5 tablets then continue taking 2 tablets everyday except 1 tablet on Mondays, Wednesdays, and Fridays.  Recheck in 6 weeks. Call our office if you have any concerns [phone].

## 2017-11-28 ENCOUNTER — Ambulatory Visit (INDEPENDENT_AMBULATORY_CARE_PROVIDER_SITE_OTHER): Payer: Medicare Other | Admitting: Pharmacist

## 2017-11-28 ENCOUNTER — Ambulatory Visit: Payer: Medicare Other | Admitting: Physician Assistant

## 2017-11-28 ENCOUNTER — Encounter: Payer: Self-pay | Admitting: Physician Assistant

## 2017-11-28 VITALS — BP 122/68 | HR 51 | Ht 69.0 in | Wt 196.4 lb

## 2017-11-28 DIAGNOSIS — I482 Chronic atrial fibrillation, unspecified: Secondary | ICD-10-CM

## 2017-11-28 DIAGNOSIS — E782 Mixed hyperlipidemia: Secondary | ICD-10-CM | POA: Diagnosis not present

## 2017-11-28 DIAGNOSIS — I2510 Atherosclerotic heart disease of native coronary artery without angina pectoris: Secondary | ICD-10-CM | POA: Diagnosis not present

## 2017-11-28 DIAGNOSIS — Z5181 Encounter for therapeutic drug level monitoring: Secondary | ICD-10-CM

## 2017-11-28 DIAGNOSIS — I480 Paroxysmal atrial fibrillation: Secondary | ICD-10-CM | POA: Diagnosis not present

## 2017-11-28 DIAGNOSIS — I426 Alcoholic cardiomyopathy: Secondary | ICD-10-CM

## 2017-11-28 DIAGNOSIS — I6350 Cerebral infarction due to unspecified occlusion or stenosis of unspecified cerebral artery: Secondary | ICD-10-CM

## 2017-11-28 DIAGNOSIS — I10 Essential (primary) hypertension: Secondary | ICD-10-CM

## 2017-11-28 LAB — POCT INR: INR: 1.9

## 2017-11-28 NOTE — Patient Instructions (Signed)
Medication Instructions: Your physician recommends that you continue on your current medications as directed. Please refer to the Current Medication list given to you today.  Labwork: Your physician has recommended that you have lab work today: CMET, CBC, Magnesium Level, Fasting Lipid Panel, TSH, and HgB A1C.  Procedures/Testing: None Ordered  Follow-Up: Your physician wants you to follow-up in: 6 MONTHS with Dr. Meda Coffee.  You will receive a reminder letter in the mail two months in advance. If you don't receive a letter, please call our office to schedule the follow-up appointment.   If you need a refill on your cardiac medications before your next appointment, please call your pharmacy.     -- IT IS HIGHLY ENCOURAGED AND RECOMMENDED THAT YOU FIND A PRIMARY CARE PHYSICIAN AS SOON AS POSSIBLE. YOU MAY CONTACT [facility] IF YOU NEED ASSISTANCE --

## 2017-11-28 NOTE — Patient Instructions (Signed)
Description   Today take 2.5 tablets then continue taking 2 tablets every day except 1 tablet on Mondays, Wednesdays, and Fridays.  Recheck in 5 weeks. Call our office if you have any concerns [phone].

## 2017-11-28 NOTE — Progress Notes (Signed)
Cardiology Office Note    Date:  11/28/2017   ID:  Vincent [name] Vincent [name], DOB 09-24-1935, MRN [id number]  PCP:  Patient, No Pcp Per  Cardiologist: Ena Dawley, MD  Chief Complaint  Patient presents with  . Follow-up    History of Present Illness:  Vincent [name] is a 82 y.o. male with history of CAD status post stent to the RCA in 2006 follow-up cath 07/2015 50-60% narrowing  distal to the RCA stent unchanged, luminal irregularities in the LAD and distal circumflex 70-75% stenosed.  Territories supplied the on the stenosis with small LVEF 35-45%.  Medical management recommended.  Patient also has history of alcoholic cardiomyopathy, PAF on Coumadin, hypertension, prior CVA.  Patient last saw Dr. Meda [name] 05/2017 and was having occasional angina but overall doing well.  Patient comes in today for follow-up.  He no longer has a primary care doctor.  Complains of shooting pains in chest and right scapula that he thinks is secondary to rheumatoid arthritis, although he is never been diagnosed with this. Relieved with tylenol extra strength. Occasional  chest tightness if going up a hill, but much better on Imdur. Hasn't happened in awhile and doesn't slow him down.  Denies dyspnea, edema, dizziness or presyncope.    Past Medical History:  Diagnosis Date  . Alcoholic cardiomyopathy (Vincent [name])   . Atrial fibrillation (Vincent [name])   . Bradycardia   . CHF (congestive heart failure) (Vincent [name])   . 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 COPD (chronic obstructive pulmonary disease) (Vincent [name])   . Coronary artery disease   . Foot drop, left 03/11/2015  . Gait disorder 03/11/2015  . GERD (gastroesophageal reflux disease)   . Glaucoma   . Gout   . 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 Hyperlipidemia   . Hypertension   . Long term (current) use of anticoagulants   . 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 Pulmonary eosinophilia (Vincent [name])   .  Stroke (Vincent [name])   . Syncope and collapse   . Unspecified glaucoma(365.9)   . V-tach [facility])     Past Surgical History:  Procedure Laterality Date  . CARDIAC CATHETERIZATION N/A 07/30/2015   Procedure: Left Heart Cath and Coronary Angiography;  Surgeon: Vincent Crome, MD;  Location: [facility];  Service: Cardiovascular;  Laterality: N/A;  . COLONOSCOPY    . KNEE SURGERY    . loop recorder  09/2007    Current Medications: Current Meds  Medication Sig  . aspirin 81 MG chewable tablet Chew 81 mg by mouth daily.  Vincent [name] atorvastatin (LIPITOR) 40 MG tablet TAKE 1 TABLET(40 MG) BY MOUTH DAILY  . brimonidine (ALPHAGAN) 0.2 % ophthalmic solution Place 1 drop into both eyes 2 (two) times daily.  . carvedilol (COREG) 6.25 MG tablet TAKE 1 TABLET(6.25 MG) BY MOUTH TWICE DAILY  . colchicine 0.6 MG tablet Take 0.6 mg by mouth daily.   . famotidine (PEPCID) 20 MG tablet Take 20 mg by mouth 2 (two) times daily.  . ferrous sulfate 325 (65 FE) MG tablet Take 325 mg by mouth daily with breakfast.  . fluocinonide cream (LIDEX) 5.99 % Apply 1 application topically daily as needed (itching).   . furosemide (LASIX) 20 MG tablet Take 1 tablet (20 mg total) by mouth daily.  . hydrocortisone (ANUSOL-HC) 2.5 % rectal cream Place 1 application rectally 2 (two) times daily as needed for hemorrhoids or itching.  Vincent [name]  hydrocortisone cream 1 % Apply 1 application topically 2 (two) times daily as needed (for eczmea).  Vincent [name] ipratropium (ATROVENT) 0.06 % nasal spray Place 2 sprays into both nostrils 4 (four) times daily.  . irbesartan (AVAPRO) 300 MG tablet TAKE 1 TABLET BY MOUTH EVERY NIGHT AT BEDTIME  . isosorbide mononitrate (IMDUR) 30 MG 24 hr tablet Take 1 tablet (30 mg total) by mouth daily.  Vincent [name] latanoprost (XALATAN) 0.005 % ophthalmic solution Place 1 drop into both eyes at bedtime.  Vincent [name] loratadine (CLARITIN) 10 MG tablet Take 10 mg by mouth daily.  . meclizine (ANTIVERT) 25 MG tablet Take 25 mg by mouth 3 (three) times  daily as needed for dizziness.  . nitroGLYCERIN (NITROLINGUAL) 0.4 MG/SPRAY spray Place 1 spray under the tongue every 5 (five) minutes x 3 doses as needed for chest pain.  . Omega-3 Fatty Acids (FISH OIL) 1000 MG CAPS Take 1,000 mg by mouth 2 (two) times daily.   . pseudoephedrine (SUDAFED) 60 MG tablet Take 60 mg by mouth every 4 (four) hours as needed for congestion.  Vincent [name] spironolactone (ALDACTONE) 25 MG tablet TAKE 1 TABLET(25 MG) BY MOUTH DAILY  . Tamsulosin HCl (FLOMAX) 0.4 MG CAPS Take 0.4 mg by mouth daily.  Vincent [name] triamcinolone cream (KENALOG) 0.1 % Apply 1 application topically 2 (two) times daily. Eczema  . warfarin (COUMADIN) 2 MG tablet TAKE AS DIRECTED BY COUMADIN CLINIC  . warfarin (COUMADIN) 2 MG tablet TAKE AS DIRECTED BY COUMADIN CLINIC   Current Facility-Administered Medications for the 11/28/17 encounter (Office Visit) with Imogene Burn, PA-C  Medication  . 0.9 %  sodium chloride infusion     Allergies:   Patient has no known allergies.   Social History   Socioeconomic History  . Marital status: Divorced    Spouse name: Not on file  . Number of children: 0  . Years of education: master's  . Highest education level: Not on file  Occupational History  . Occupation: semi-retired  Social Needs  . Financial resource strain: Not on file  . Food insecurity:    Worry: Not on file    Inability: Not on file  . Transportation needs:    Medical: Not on file    Non-medical: Not on file  Tobacco Use  . Smoking status: Former Smoker    Last attempt to quit: 08/22/1983    Years since quitting: 34.2  . Smokeless tobacco: Never Used  Substance and Sexual Activity  . Alcohol use: No  . Drug use: No  . Sexual activity: Not on file  Lifestyle  . Physical activity:    Days per week: Not on file    Minutes per session: Not on file  . Stress: Not on file  Relationships  . Social connections:    Talks on phone: Not on file    Gets together: Not on file    Attends religious  service: Not on file    Active member of club or organization: Not on file    Attends meetings of clubs or organizations: Not on file    Relationship status: Not on file  Other Topics Concern  . Not on file  Social History Narrative   Patient drinks 1-2 cups of caffeine daily.   Patient is right handed.     Family History:  The patient's family history includes Cancer in his brother, mother, and sister; Colon cancer in his mother; Heart attack in his father and sister; Heart disease in his father; Hypertension in  his father and mother; Prostate cancer in his brother.   ROS:   Please see the history of present illness.    Review of Systems  Constitution: Negative.  HENT: Negative.   Cardiovascular: Positive for chest pain.  Respiratory: Negative.   Endocrine: Negative.   Hematologic/Lymphatic: Negative.   Musculoskeletal: Negative.   Gastrointestinal: Negative.   Genitourinary: Negative.   Neurological: Negative.    All other systems reviewed and are negative.   PHYSICAL EXAM:   VS:  BP 122/68   Pulse (!) 51   Ht '5\' 9"'$  (1.753 m)   Wt 196 lb 6.4 oz (89.1 kg)   SpO2 98%   BMI 29.00 kg/m   Physical Exam  GEN: Well nourished, well developed, in no acute distress  Neck: no JVD, carotid bruits, or masses Cardiac:RRR; distant heart sounds, 1/6 systolic murmur at the left sternal border Respiratory:  clear to auscultation bilaterally, normal work of breathing GI: soft, nontender, nondistended, + BS Ext: without cyanosis, clubbing, or edema, Good distal pulses bilaterally Neuro:  Alert and Oriented x 3,  Psych: euthymic mood, full affect  Wt Readings from Last 3 Encounters:  11/28/17 196 lb 6.4 oz (89.1 kg)  06/28/17 209 lb (94.8 kg)  02/22/17 204 lb (92.5 kg)      Studies/Labs Reviewed:   EKG:  EKG is not ordered today.  Recent Labs: 02/20/2017: ALT 25; BUN 8; Creatinine, Ser 1.01; Hemoglobin 12.9; Platelets 185; Potassium 4.4; Sodium 138; TSH 0.908   Lipid Panel      Component Value Date/Time   CHOL 164 02/20/2017 1007   TRIG 92 02/20/2017 1007   HDL 54 02/20/2017 1007   CHOLHDL 3.0 02/20/2017 1007   CHOLHDL 2.6 04/27/2016 1208   VLDL 25 04/27/2016 1208   LDLCALC 92 02/20/2017 1007    Additional studies/ records that were reviewed today include:   Cardiac  catheterization 07/30/15 Procedures   Left Heart Cath and Coronary Angiography  Conclusion   1. Mid RCA-1 lesion, 20% stenosed. The lesion was previously treated with a stent (bare metal). 2. Mid RCA-2 lesion, 60% stenosed. 3. Prox RCA lesion, 60% stenosed. 4. Mid Cx to Dist Cx lesion, 75% stenosed. 5. Ost LAD to [facility] LAD lesion, 25% stenosed.    The right coronary artery contains a patent stent in the midsegment. Proximal and distal to the stent or eccentric regions of 50-60% narrowing. These areas are unchanged compared to 2006.  Codominant left coronary system with luminal irregularities in LAD and distal circumflex 70-75% stenosis before the origin of a small branching obtuse marginal. The distal circumflex disease has progressed since 2006. The territory supplied beyond the stenosis is small  Dilated and globally hypocontractile left ventricle with ejection fraction 35%. Left ventricular end-diastolic pressure is normal.       RECOMMENDATIONS:    The right coronary has not changed since the last angiogram. There is moderate progression in the distal circumflex. The circumflex could be causing angina. This territory is most appropriately treated with medical therapy given the need for triple drug therapy if we stent. The risk of bleeding would be greater than the benefit achieved by stenting in this relatively limited territory.     Echo 2015Study Conclusions  - Left ventricle: The cavity size was normal. Wall thickness was   increased in a pattern of mild LVH. Systolic function was mildly   to moderately reduced. The estimated ejection fraction was in the   range of 40% to 45%.  Diffuse hypokinesis. Left  ventricular   diastolic function parameters were normal. - Pulmonary arteries: PA peak pressure: 38 mm Hg (S).    ASSESSMENT:    1. Atherosclerosis of native coronary artery of native heart without angina pectoris   2. Alcoholic cardiomyopathy ([place])   3. Essential hypertension   4. Paroxysmal atrial fibrillation (HCC)   5. Mixed hyperlipidemia      PLAN:  In order of problems listed above:  CAD status post stent to the RCA in 2006 follow-up cath 07/2015 50-60% narrowing  distal to the RCA stent unchanged, luminal irregularities in the LAD and distal circumflex 70-75% stenosed.  Territories supplied the on the stenosis with small LVEF 35-45%.  Medical management recommended.  Patient has a very occasional angina that has improved with the addition of Imdur.  No further changes.  Alcoholic cardiomyopathy LVEF 35-45% no evidence of heart failure on exam.  Well compensated  Essential hypertension blood pressure well controlled.  Will check full labs including CBC C met TSH hemoglobin A1c and magnesium.  Patient needs primary care.  His blood sugars have been up on several occasions he has never been diagnosed with diabetes.  Paroxysmal atrial fibrillation on Coumadin rate well controlled  Mixed hyperlipidemia continue Lipitor.  Checking fasting lipid panel.  Medication Adjustments/Labs and Tests Ordered: Current medicines are reviewed at length with the patient today.  Concerns regarding medicines are outlined above.  Medication changes, Labs and Tests ordered today are listed in the Patient Instructions below. There are no Patient Instructions on file for this visit.   Sumner Boast, PA-C  11/28/2017 11:36 AM    [facility] [place], [address], [place]  [address] Phone: [phone]; Fax: [phone]

## 2017-11-29 LAB — COMPREHENSIVE METABOLIC PANEL
ALT: 18 IU/L (ref 0–44)
AST: 18 IU/L (ref 0–40)
Albumin/Globulin Ratio: 1.4 (ref 1.2–2.2)
Albumin: 3.9 g/dL (ref 3.5–4.8)
Alkaline Phosphatase: 89 IU/L (ref 39–117)
BUN/Creatinine Ratio: 12 (ref 10–24)
BUN: 13 mg/dL (ref 8–27)
Bilirubin Total: 1 mg/dL (ref 0.0–1.2)
CO2: 25 mmol/L (ref 20–29)
Calcium: 8.9 mg/dL (ref 8.6–10.2)
Chloride: 96 mmol/L (ref 96–106)
Creatinine, Ser: 1.07 mg/dL (ref 0.76–1.27)
GFR calc Af Amer: 76 mL/min/{1.73_m2} (ref >59)
GFR calc non Af Amer: 66 mL/min/{1.73_m2} (ref >59)
Globulin, Total: 2.8 g/dL (ref 1.5–4.5)
Glucose: 374 mg/dL — ABNORMAL HIGH (ref 65–99)
Potassium: 4.5 mmol/L (ref 3.5–5.2)
Sodium: 137 mmol/L (ref 134–144)
Total Protein: 6.7 g/dL (ref 6.0–8.5)

## 2017-11-29 LAB — CBC WITH DIFFERENTIAL/PLATELET
Basophils Absolute: 0 10*3/uL (ref 0.0–0.2)
Basos: 0 %
EOS (ABSOLUTE): 0.2 10*3/uL (ref 0.0–0.4)
Eos: 5 %
Hematocrit: 41 % (ref 37.5–51.0)
Hemoglobin: 13 g/dL (ref 13.0–17.7)
Immature Grans (Abs): 0 10*3/uL (ref 0.0–0.1)
Immature Granulocytes: 0 %
Lymphocytes Absolute: 0.7 10*3/uL (ref 0.7–3.1)
Lymphs: 22 %
MCH: 26.8 pg (ref 26.6–33.0)
MCHC: 31.7 g/dL (ref 31.5–35.7)
MCV: 85 fL (ref 79–97)
Monocytes Absolute: 0.4 10*3/uL (ref 0.1–0.9)
Monocytes: 11 %
Neutrophils Absolute: 2.1 10*3/uL (ref 1.4–7.0)
Neutrophils: 62 %
Platelets: 186 10*3/uL (ref 150–379)
RBC: 4.85 x10E6/uL (ref 4.14–5.80)
RDW: 14.4 % (ref 12.3–15.4)
WBC: 3.4 10*3/uL (ref 3.4–10.8)

## 2017-11-29 LAB — MAGNESIUM: Magnesium: 1.7 mg/dL (ref 1.6–2.3)

## 2017-11-29 LAB — HEMOGLOBIN A1C
Est. average glucose Bld gHb Est-mCnc: 364 mg/dL
Hgb A1c MFr Bld: 14.3 % — ABNORMAL HIGH (ref 4.8–5.6)

## 2017-11-29 LAB — LIPID PANEL
Chol/HDL Ratio: 3 ratio (ref 0.0–5.0)
Cholesterol, Total: 156 mg/dL (ref 100–199)
HDL: 52 mg/dL (ref >39)
LDL Calculated: 72 mg/dL (ref 0–99)
Triglycerides: 160 mg/dL — ABNORMAL HIGH (ref 0–149)
VLDL Cholesterol Cal: 32 mg/dL (ref 5–40)

## 2017-11-29 LAB — TSH: TSH: 0.715 u[IU]/mL (ref 0.450–4.500)

## 2017-12-08 ENCOUNTER — Other Ambulatory Visit: Payer: Self-pay | Admitting: Cardiology

## 2017-12-08 DIAGNOSIS — R9439 Abnormal result of other cardiovascular function study: Secondary | ICD-10-CM

## 2017-12-08 DIAGNOSIS — I2583 Coronary atherosclerosis due to lipid rich plaque: Secondary | ICD-10-CM

## 2017-12-08 DIAGNOSIS — I10 Essential (primary) hypertension: Secondary | ICD-10-CM

## 2017-12-08 DIAGNOSIS — Z01812 Encounter for preprocedural laboratory examination: Secondary | ICD-10-CM

## 2017-12-08 DIAGNOSIS — I2510 Atherosclerotic heart disease of native coronary artery without angina pectoris: Secondary | ICD-10-CM

## 2017-12-08 DIAGNOSIS — I4821 Permanent atrial fibrillation: Secondary | ICD-10-CM

## 2017-12-08 DIAGNOSIS — I209 Angina pectoris, unspecified: Secondary | ICD-10-CM

## 2017-12-08 DIAGNOSIS — I5023 Acute on chronic systolic (congestive) heart failure: Secondary | ICD-10-CM

## 2018-01-02 ENCOUNTER — Ambulatory Visit (INDEPENDENT_AMBULATORY_CARE_PROVIDER_SITE_OTHER): Payer: Medicare Other | Admitting: Pharmacist

## 2018-01-02 DIAGNOSIS — I6350 Cerebral infarction due to unspecified occlusion or stenosis of unspecified cerebral artery: Secondary | ICD-10-CM

## 2018-01-02 DIAGNOSIS — Z5181 Encounter for therapeutic drug level monitoring: Secondary | ICD-10-CM | POA: Diagnosis not present

## 2018-01-02 DIAGNOSIS — I482 Chronic atrial fibrillation, unspecified: Secondary | ICD-10-CM

## 2018-01-02 LAB — POCT INR: INR: 2.2

## 2018-01-02 NOTE — Patient Instructions (Signed)
Description   Continue taking 2 tablets every day except 1 tablet on Mondays, Wednesdays, and Fridays.  Recheck in 6 weeks. Call our office if you have any concerns [phone].     

## 2018-01-25 DIAGNOSIS — H2512 Age-related nuclear cataract, left eye: Secondary | ICD-10-CM | POA: Diagnosis not present

## 2018-01-25 DIAGNOSIS — H401133 Primary open-angle glaucoma, bilateral, severe stage: Secondary | ICD-10-CM | POA: Diagnosis not present

## 2018-02-13 ENCOUNTER — Encounter (INDEPENDENT_AMBULATORY_CARE_PROVIDER_SITE_OTHER): Payer: Self-pay

## 2018-02-13 ENCOUNTER — Ambulatory Visit: Payer: Medicare Other | Admitting: *Deleted

## 2018-02-13 DIAGNOSIS — I482 Chronic atrial fibrillation, unspecified: Secondary | ICD-10-CM

## 2018-02-13 DIAGNOSIS — I6350 Cerebral infarction due to unspecified occlusion or stenosis of unspecified cerebral artery: Secondary | ICD-10-CM

## 2018-02-13 DIAGNOSIS — Z5181 Encounter for therapeutic drug level monitoring: Secondary | ICD-10-CM | POA: Diagnosis not present

## 2018-02-13 LAB — POCT INR: INR: 2.2 (ref 2.0–3.0)

## 2018-02-13 NOTE — Patient Instructions (Signed)
Description   Continue taking 2 tablets every day except 1 tablet on Mondays, Wednesdays, and Fridays.  Recheck in 6 weeks. Call our office if you have any concerns [phone].     

## 2018-02-23 ENCOUNTER — Encounter

## 2018-03-13 ENCOUNTER — Other Ambulatory Visit: Payer: Self-pay | Admitting: Cardiology

## 2018-03-27 ENCOUNTER — Ambulatory Visit: Payer: Medicare Other | Admitting: *Deleted

## 2018-03-27 DIAGNOSIS — Z5181 Encounter for therapeutic drug level monitoring: Secondary | ICD-10-CM

## 2018-03-27 DIAGNOSIS — I482 Chronic atrial fibrillation, unspecified: Secondary | ICD-10-CM

## 2018-03-27 DIAGNOSIS — I6350 Cerebral infarction due to unspecified occlusion or stenosis of unspecified cerebral artery: Secondary | ICD-10-CM

## 2018-03-27 LAB — POCT INR: INR: 2.4 (ref 2.0–3.0)

## 2018-03-27 NOTE — Patient Instructions (Signed)
Description   Continue taking 2 tablets every day except 1 tablet on Mondays, Wednesdays, and Fridays.  Recheck in 6 weeks. Call our office if you have any concerns [phone].

## 2018-05-07 DIAGNOSIS — I10 Essential (primary) hypertension: Secondary | ICD-10-CM | POA: Diagnosis not present

## 2018-05-07 DIAGNOSIS — M109 Gout, unspecified: Secondary | ICD-10-CM | POA: Diagnosis not present

## 2018-05-07 DIAGNOSIS — E1165 Type 2 diabetes mellitus with hyperglycemia: Secondary | ICD-10-CM | POA: Diagnosis not present

## 2018-05-07 DIAGNOSIS — I509 Heart failure, unspecified: Secondary | ICD-10-CM | POA: Diagnosis not present

## 2018-05-08 ENCOUNTER — Ambulatory Visit: Payer: Medicare Other

## 2018-05-08 DIAGNOSIS — Z5181 Encounter for therapeutic drug level monitoring: Secondary | ICD-10-CM | POA: Diagnosis not present

## 2018-05-08 DIAGNOSIS — I6350 Cerebral infarction due to unspecified occlusion or stenosis of unspecified cerebral artery: Secondary | ICD-10-CM

## 2018-05-08 DIAGNOSIS — I482 Chronic atrial fibrillation, unspecified: Secondary | ICD-10-CM

## 2018-05-08 LAB — POCT INR: INR: 1.9 — AB (ref 2.0–3.0)

## 2018-05-08 NOTE — Patient Instructions (Signed)
Please take extra 1/2 pill tonight, then continue taking 2 tablets every day except 1 tablet on Mondays, Wednesdays, and Fridays.  Recheck in 6 weeks.  Call our office if you have any concerns [phone].

## 2018-05-12 ENCOUNTER — Other Ambulatory Visit: Payer: Self-pay | Admitting: Cardiology

## 2018-05-12 DIAGNOSIS — I4821 Permanent atrial fibrillation: Secondary | ICD-10-CM

## 2018-05-12 DIAGNOSIS — R9439 Abnormal result of other cardiovascular function study: Secondary | ICD-10-CM

## 2018-05-12 DIAGNOSIS — E7849 Other hyperlipidemia: Secondary | ICD-10-CM

## 2018-05-12 DIAGNOSIS — I2583 Coronary atherosclerosis due to lipid rich plaque: Principal | ICD-10-CM

## 2018-05-12 DIAGNOSIS — I10 Essential (primary) hypertension: Secondary | ICD-10-CM

## 2018-05-12 DIAGNOSIS — Z01812 Encounter for preprocedural laboratory examination: Secondary | ICD-10-CM

## 2018-05-12 DIAGNOSIS — I5023 Acute on chronic systolic (congestive) heart failure: Secondary | ICD-10-CM

## 2018-05-12 DIAGNOSIS — I2510 Atherosclerotic heart disease of native coronary artery without angina pectoris: Secondary | ICD-10-CM

## 2018-06-05 DIAGNOSIS — E1142 Type 2 diabetes mellitus with diabetic polyneuropathy: Secondary | ICD-10-CM | POA: Diagnosis not present

## 2018-06-05 DIAGNOSIS — E1165 Type 2 diabetes mellitus with hyperglycemia: Secondary | ICD-10-CM | POA: Diagnosis not present

## 2018-06-05 DIAGNOSIS — I10 Essential (primary) hypertension: Secondary | ICD-10-CM | POA: Diagnosis not present

## 2018-06-05 DIAGNOSIS — Z23 Encounter for immunization: Secondary | ICD-10-CM | POA: Diagnosis not present

## 2018-06-14 DIAGNOSIS — R351 Nocturia: Secondary | ICD-10-CM | POA: Diagnosis not present

## 2018-06-14 DIAGNOSIS — N401 Enlarged prostate with lower urinary tract symptoms: Secondary | ICD-10-CM | POA: Diagnosis not present

## 2018-06-15 DIAGNOSIS — E1165 Type 2 diabetes mellitus with hyperglycemia: Secondary | ICD-10-CM | POA: Diagnosis not present

## 2018-06-19 ENCOUNTER — Ambulatory Visit: Payer: Medicare Other

## 2018-06-19 DIAGNOSIS — Z5181 Encounter for therapeutic drug level monitoring: Secondary | ICD-10-CM

## 2018-06-19 DIAGNOSIS — I6350 Cerebral infarction due to unspecified occlusion or stenosis of unspecified cerebral artery: Secondary | ICD-10-CM

## 2018-06-19 DIAGNOSIS — I482 Chronic atrial fibrillation, unspecified: Secondary | ICD-10-CM

## 2018-06-19 LAB — POCT INR: INR: 1.3 — AB (ref 2.0–3.0)

## 2018-06-19 NOTE — Patient Instructions (Signed)
Please take 3 tablet tonight, 2 tablets tomorrow, then continue taking 2 tablets every day except 1 tablet on Mondays, Wednesdays, and Fridays.  Recheck in 4 weeks.  Call our office if you have any concerns [phone].

## 2018-06-27 DIAGNOSIS — E119 Type 2 diabetes mellitus without complications: Secondary | ICD-10-CM | POA: Diagnosis not present

## 2018-06-27 DIAGNOSIS — H401133 Primary open-angle glaucoma, bilateral, severe stage: Secondary | ICD-10-CM | POA: Diagnosis not present

## 2018-06-27 DIAGNOSIS — G464 Cerebellar stroke syndrome: Secondary | ICD-10-CM | POA: Diagnosis not present

## 2018-06-27 DIAGNOSIS — H35033 Hypertensive retinopathy, bilateral: Secondary | ICD-10-CM | POA: Diagnosis not present

## 2018-07-13 DIAGNOSIS — E1165 Type 2 diabetes mellitus with hyperglycemia: Secondary | ICD-10-CM | POA: Diagnosis not present

## 2018-07-19 ENCOUNTER — Ambulatory Visit: Payer: Medicare Other | Admitting: *Deleted

## 2018-07-19 DIAGNOSIS — Z5181 Encounter for therapeutic drug level monitoring: Secondary | ICD-10-CM

## 2018-07-19 DIAGNOSIS — I482 Chronic atrial fibrillation, unspecified: Secondary | ICD-10-CM

## 2018-07-19 DIAGNOSIS — I6350 Cerebral infarction due to unspecified occlusion or stenosis of unspecified cerebral artery: Secondary | ICD-10-CM

## 2018-07-19 LAB — POCT INR: INR: 1.9 — AB (ref 2.0–3.0)

## 2018-07-19 NOTE — Patient Instructions (Signed)
Description   Today take 3 tablets, then change your dose to 2 tablets everyday except 1 tablet on Mondays and fridays. Recheck in 2 weeks.  Call our office if you have any concerns [phone].

## 2018-08-03 ENCOUNTER — Ambulatory Visit: Payer: Medicare Other | Admitting: *Deleted

## 2018-08-03 ENCOUNTER — Ambulatory Visit (INDEPENDENT_AMBULATORY_CARE_PROVIDER_SITE_OTHER): Payer: Medicare Other | Admitting: Podiatry

## 2018-08-03 ENCOUNTER — Encounter: Payer: Self-pay | Admitting: Podiatry

## 2018-08-03 VITALS — BP 197/106 | HR 64

## 2018-08-03 DIAGNOSIS — M79674 Pain in right toe(s): Secondary | ICD-10-CM

## 2018-08-03 DIAGNOSIS — I482 Chronic atrial fibrillation, unspecified: Secondary | ICD-10-CM | POA: Diagnosis not present

## 2018-08-03 DIAGNOSIS — I6350 Cerebral infarction due to unspecified occlusion or stenosis of unspecified cerebral artery: Secondary | ICD-10-CM | POA: Diagnosis not present

## 2018-08-03 DIAGNOSIS — Z5181 Encounter for therapeutic drug level monitoring: Secondary | ICD-10-CM | POA: Diagnosis not present

## 2018-08-03 DIAGNOSIS — B351 Tinea unguium: Secondary | ICD-10-CM

## 2018-08-03 DIAGNOSIS — M79675 Pain in left toe(s): Secondary | ICD-10-CM | POA: Diagnosis not present

## 2018-08-03 DIAGNOSIS — E1159 Type 2 diabetes mellitus with other circulatory complications: Secondary | ICD-10-CM | POA: Diagnosis not present

## 2018-08-03 DIAGNOSIS — D689 Coagulation defect, unspecified: Secondary | ICD-10-CM

## 2018-08-03 DIAGNOSIS — E1142 Type 2 diabetes mellitus with diabetic polyneuropathy: Secondary | ICD-10-CM

## 2018-08-03 LAB — POCT INR: INR: 2.6 (ref 2.0–3.0)

## 2018-08-03 NOTE — Progress Notes (Signed)
This patient presents the office for an evaluation of his recently diagnosed diabetic feet.  He states he has pain and discomfort in his toenails walking and wearing his shoes.  He is unable to self treat.  He has a history of a stroke for which she is taking Coumadin.  He is referred to this office for a diabetic foot exam and treatment.  General Appearance  Alert, conversant and in no acute stress.  Vascular  Dorsalis pedis and posterior tibial  pulses are weakly  palpable  bilaterally.  Capillary return is within normal limits  bilaterally. Temperature is within normal limits  bilaterally.  Neurologic  Senn-Weinstein monofilament wire test diminished   bilaterally. Muscle power within normal limits bilaterally.  Nails Thick disfigured discolored nails with subungual debris  from hallux to fifth toes bilaterally. No evidence of bacterial infection or drainage bilaterally.  Orthopedic  No limitations of motion  feet .  No crepitus or effusions noted.  HAV  B/L.  Skin  normotropic skin with no porokeratosis noted bilaterally.  No signs of infections or ulcers noted.    Onychomycosis  Diabetes with angiopathy and neuropathy.  IE.  Diabetic foot exam reveals weak pulses and absent L OPS.  Patient does qualify for diabetic shoes.  He is to return to the office in 3 months for continued preventative foot care services   Gardiner Barefoot DPM

## 2018-08-03 NOTE — Patient Instructions (Signed)
Description   Continue taking 2 tablets everyday except 1 tablet on Mondays and Fridays. Recheck in 3 weeks.  Call our office if you have any concerns [phone].

## 2018-08-04 ENCOUNTER — Other Ambulatory Visit: Payer: Self-pay | Admitting: Cardiology

## 2018-08-07 DIAGNOSIS — E1165 Type 2 diabetes mellitus with hyperglycemia: Secondary | ICD-10-CM | POA: Diagnosis not present

## 2018-08-15 DIAGNOSIS — E1165 Type 2 diabetes mellitus with hyperglycemia: Secondary | ICD-10-CM | POA: Diagnosis not present

## 2018-08-24 ENCOUNTER — Ambulatory Visit: Payer: Medicare Other | Admitting: *Deleted

## 2018-08-24 ENCOUNTER — Other Ambulatory Visit: Payer: Self-pay | Admitting: Cardiology

## 2018-08-24 DIAGNOSIS — I482 Chronic atrial fibrillation, unspecified: Secondary | ICD-10-CM

## 2018-08-24 DIAGNOSIS — Z5181 Encounter for therapeutic drug level monitoring: Secondary | ICD-10-CM

## 2018-08-24 DIAGNOSIS — I6350 Cerebral infarction due to unspecified occlusion or stenosis of unspecified cerebral artery: Secondary | ICD-10-CM | POA: Diagnosis not present

## 2018-08-24 LAB — POCT INR: INR: 2.7 (ref 2.0–3.0)

## 2018-08-24 NOTE — Patient Instructions (Signed)
Description   Continue taking 2 tablets everyday except 1 tablet on Mondays and Fridays. Recheck in 4 weeks.  Call our office if you have any concerns [phone].

## 2018-09-12 ENCOUNTER — Other Ambulatory Visit: Payer: Self-pay | Admitting: Cardiology

## 2018-09-13 ENCOUNTER — Other Ambulatory Visit: Payer: Self-pay | Admitting: Cardiology

## 2018-09-13 DIAGNOSIS — I509 Heart failure, unspecified: Secondary | ICD-10-CM | POA: Diagnosis not present

## 2018-09-13 DIAGNOSIS — I69359 Hemiplegia and hemiparesis following cerebral infarction affecting unspecified side: Secondary | ICD-10-CM | POA: Diagnosis not present

## 2018-09-13 DIAGNOSIS — E1165 Type 2 diabetes mellitus with hyperglycemia: Secondary | ICD-10-CM | POA: Diagnosis not present

## 2018-09-13 DIAGNOSIS — I10 Essential (primary) hypertension: Secondary | ICD-10-CM | POA: Diagnosis not present

## 2018-09-19 DIAGNOSIS — E119 Type 2 diabetes mellitus without complications: Secondary | ICD-10-CM | POA: Diagnosis not present

## 2018-09-21 ENCOUNTER — Ambulatory Visit: Payer: Medicare Other | Admitting: *Deleted

## 2018-09-21 DIAGNOSIS — Z5181 Encounter for therapeutic drug level monitoring: Secondary | ICD-10-CM

## 2018-09-21 DIAGNOSIS — I6350 Cerebral infarction due to unspecified occlusion or stenosis of unspecified cerebral artery: Secondary | ICD-10-CM

## 2018-09-21 DIAGNOSIS — I482 Chronic atrial fibrillation, unspecified: Secondary | ICD-10-CM

## 2018-09-21 LAB — POCT INR: INR: 2.3 (ref 2.0–3.0)

## 2018-09-21 NOTE — Patient Instructions (Signed)
Description   Continue taking 2 tablets everyday except 1 tablet on Mondays and Fridays. Recheck in 6 weeks.  Call our office if you have any concerns [phone].

## 2018-09-27 DIAGNOSIS — H401133 Primary open-angle glaucoma, bilateral, severe stage: Secondary | ICD-10-CM | POA: Diagnosis not present

## 2018-09-27 DIAGNOSIS — E119 Type 2 diabetes mellitus without complications: Secondary | ICD-10-CM | POA: Diagnosis not present

## 2018-09-27 DIAGNOSIS — H2512 Age-related nuclear cataract, left eye: Secondary | ICD-10-CM | POA: Diagnosis not present

## 2018-10-14 DIAGNOSIS — I509 Heart failure, unspecified: Secondary | ICD-10-CM | POA: Diagnosis not present

## 2018-10-14 DIAGNOSIS — N400 Enlarged prostate without lower urinary tract symptoms: Secondary | ICD-10-CM | POA: Diagnosis not present

## 2018-10-14 DIAGNOSIS — I2510 Atherosclerotic heart disease of native coronary artery without angina pectoris: Secondary | ICD-10-CM | POA: Diagnosis not present

## 2018-10-14 DIAGNOSIS — I10 Essential (primary) hypertension: Secondary | ICD-10-CM | POA: Diagnosis not present

## 2018-10-23 ENCOUNTER — Ambulatory Visit (HOSPITAL_COMMUNITY)
Admission: EM | Admit: 2018-10-23 | Discharge: 2018-10-23 | Disposition: A | Discharge status: Home / Self Care | Payer: Medicare Other | Attending: Family Medicine | Admitting: Family Medicine

## 2018-10-23 ENCOUNTER — Other Ambulatory Visit: Payer: Self-pay

## 2018-10-23 ENCOUNTER — Encounter (HOSPITAL_COMMUNITY): Payer: Self-pay | Admitting: Emergency Medicine

## 2018-10-23 ENCOUNTER — Ambulatory Visit (INDEPENDENT_AMBULATORY_CARE_PROVIDER_SITE_OTHER): Payer: Medicare Other

## 2018-10-23 DIAGNOSIS — R079 Chest pain, unspecified: Secondary | ICD-10-CM

## 2018-10-23 NOTE — Discharge Instructions (Addendum)
Your EKG was similar to previous EKG with no acute or new changes. Your x-ray did not show any pneumonia I believe that your pain may be muscle pain based on the fact that it is worse when you bend over or sneeze. You can keep taking the Tylenol extra strength every 6-8 hours for pain If your chest pain worsens or you start developing any shortness of breath, dizziness, increased swelling in the lower extremities please go to the ER

## 2018-10-23 NOTE — ED Provider Notes (Addendum)
[place]    CSN: [id number] Arrival date & time: 10/23/18  1430     History   Chief Complaint Chief Complaint  Patient presents with  . Cough    HPI Vincent [name] is a 83 y.o. male.   Pt is an 83 year old male with PMH of alcoholic cardiomyopathy, A. fib, bradycardia, CHF, COPD, CAD, GERD, gout, hyperlipidemia, hypertension, CVA.  He presents with left sided chest pain, mild cough and congestion x 4 days. Symptoms have been waxing and waning. The pain is worse with moving over, bending and sneezing.Describes the pain as sharp at times and aching at others.  He has been taking tylenol with some relief of the pain. He denies any associated fevers, SOB, increased sputum production or increased LE edema. No recent traveling or sick contacts. No falls or injuries.   ROS per HPI      Past Medical History:  Diagnosis Date  . Alcoholic cardiomyopathy ([place])   . Atrial fibrillation ([place])   . Bradycardia   . CHF (congestive heart failure) ([place])   . 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 COPD (chronic obstructive pulmonary disease) ([place])   . Coronary artery disease   . Foot drop, left 03/11/2015  . Gait disorder 03/11/2015  . GERD (gastroesophageal reflux disease)   . Glaucoma   . Gout   . Hemiparesis and alteration of sensations as late effects of stroke ([place]) 09/25/2015  . Hyperlipidemia   . Hypertension   . Long term (current) use of anticoagulants   . 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 Pulmonary eosinophilia ([place])   . Stroke ([place])   . Syncope and collapse   . Unspecified glaucoma(365.9)   . V-tach [facility])     Patient Active Problem List   Diagnosis Date Noted  . Chest pain 11/25/2015  . 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 Hemiparesis and alteration of sensations as late effects of stroke ([place]) 09/25/2015  . Abnormal stress test 07/21/2015  . Foot drop, left 03/11/2015  . Gait disorder 03/11/2015  .  Precordial pain 03/19/2014  . Dizziness 03/17/2014  . Near syncope 03/17/2014  . Encounter for therapeutic drug monitoring 09/24/2013  . Rectal bleeding 10/19/2012  . External hemorrhoid, bleeding 10/19/2012  . Long term current use of anticoagulant 10/12/2010  . GLAUCOMA 09/17/2010  . GERD 09/17/2010  . HEMORRHAGE OF RECTUM AND ANUS 09/17/2010  . BRADYCARDIA 06/09/2010  . VENTRICULAR TACHYCARDIA 12/30/2008  . SYNCOPE 12/30/2008  . PULMONARY INFILTRATE INCLUDES (EOSINOPHILIA) 12/28/2007  . HLD (hyperlipidemia) 12/27/2007  . Essential hypertension 12/27/2007  . Coronary atherosclerosis 12/27/2007  . Alcoholic cardiomyopathy ([place]) 12/27/2007  . PAROXYSMAL ATRIAL FIBRILLATION 12/27/2007  . Cerebral artery occlusion with cerebral infarction ([place]) 12/27/2007  . COUGH 12/27/2007    Past Surgical History:  Procedure Laterality Date  . CARDIAC CATHETERIZATION N/A 07/30/2015   Procedure: Left Heart Cath and Coronary Angiography;  Surgeon: Belva Crome, MD;  Location: [facility];  Service: Cardiovascular;  Laterality: N/A;  . COLONOSCOPY    . KNEE SURGERY    . loop recorder  09/2007       Home Medications    Prior to Admission medications   Medication Sig Start Date End Date Taking? Authorizing Provider  ACCU-CHEK FASTCLIX LANCETS MISC USE TO CHECK BLOOD SUGAR QD 06/05/18   [provider]  ACCU-CHEK GUIDE test strip USE TO CHECK BLOOD SUGAR QD 06/07/18  [provider]  aspirin 81 MG chewable tablet Chew 81 mg by mouth daily.    [provider]  atorvastatin (LIPITOR) 40 MG tablet TAKE 1 TABLET(40 MG) BY MOUTH DAILY 08/24/18   [name], MD  brimonidine (ALPHAGAN) 0.2 % ophthalmic solution Place 1 drop into both eyes 2 (two) times daily. 07/30/16   [provider]  carvedilol (COREG) 6.25 MG tablet TAKE 1 TABLET(6.25 MG) BY MOUTH TWICE DAILY 10/09/17   [name], MD  colchicine 0.6 MG tablet Take 0.6 mg by mouth daily.      [provider]  famotidine (PEPCID) 20 MG tablet Take 20 mg by mouth 2 (two) times daily.    [provider]  ferrous sulfate 325 (65 FE) MG tablet Take 325 mg by mouth daily with breakfast.    [provider]  fluocinonide cream (LIDEX) 3.29 % Apply 1 application topically daily as needed (itching).  01/13/15   [provider]  furosemide (LASIX) 20 MG tablet TAKE 1 TABLET(20 MG) BY MOUTH DAILY 05/14/18   [name], MD  hydrocortisone (ANUSOL-HC) 2.5 % rectal cream Place 1 application rectally 2 (two) times daily as needed for hemorrhoids or itching. 07/27/16   Levin Erp, PA  hydrocortisone cream 1 % Apply 1 application topically 2 (two) times daily as needed (for eczmea).    [provider]  ipratropium (ATROVENT) 0.06 % nasal spray Place 2 sprays into both nostrils 4 (four) times daily. 11/21/16   [name], NP  irbesartan (AVAPRO) 300 MG tablet TAKE 1 TABLET BY MOUTH EVERY NIGHT AT BEDTIME 12/11/17   [name], MD  isosorbide mononitrate (IMDUR) 30 MG 24 hr tablet TAKE 1 TABLET(30 MG) BY MOUTH DAILY 09/12/18   [name], MD  latanoprost (XALATAN) 0.005 % ophthalmic solution Place 1 drop into both eyes at bedtime.    [provider]  loratadine (CLARITIN) 10 MG tablet Take 10 mg by mouth daily.    [provider]  meclizine (ANTIVERT) 25 MG tablet Take 25 mg by mouth 3 (three) times daily as needed for dizziness.    [provider]  metFORMIN (GLUCOPHAGE) 500 MG tablet TK 1 T PO BID [facility] 05/09/18   [provider]  nitroGLYCERIN (NITROLINGUAL) 0.4 MG/SPRAY spray Place 1 spray under the tongue every 5 (five) minutes x 3 doses as needed for chest pain. 10/18/16   [name], MD  Omega-3 Fatty Acids (FISH OIL) 1000 MG CAPS Take 1,000 mg by mouth 2 (two) times daily.     [provider]  pseudoephedrine (SUDAFED) 60 MG tablet Take 60 mg by mouth every 4 (four)  hours as needed for congestion.    [provider]  spironolactone (ALDACTONE) 25 MG tablet TAKE 1 TABLET(25 MG) BY MOUTH DAILY 07/24/17   [name], MD  Tamsulosin HCl (FLOMAX) 0.4 MG CAPS Take 0.4 mg by mouth daily. 12/28/11   [provider]  triamcinolone cream (KENALOG) 0.1 % Apply 1 application topically 2 (two) times daily. Eczema 01/20/15   [provider]  warfarin (COUMADIN) 2 MG tablet TAKE AS DIRECTED BY COUMADIN CLINIC...(150 TABLETS PER 90 DAYS) 09/13/18   [name], MD    Family History Family History  Problem Relation Age of Onset  . Colon cancer Mother   . Hypertension Mother   . Cancer Mother   . Prostate cancer Brother   . Cancer Brother   . Heart disease Father   .  Heart attack Father   . Hypertension Father   . Heart attack Sister   . Cancer Sister   . Stroke Neg Hx   . Colon polyps Neg Hx   . Esophageal cancer Neg Hx   . Stomach cancer Neg Hx   . Rectal cancer Neg Hx   . Pancreatic cancer Neg Hx     Social History Social History   Tobacco Use  . Smoking status: Former Smoker    Last attempt to quit: 08/22/1983    Years since quitting: 35.1  . Smokeless tobacco: Never Used  Substance Use Topics  . Alcohol use: No  . Drug use: No     Allergies   Patient has no known allergies.   Review of Systems Review of Systems  HENT: Positive for congestion.   Respiratory: Positive for cough. Negative for apnea, choking, chest tightness, shortness of breath, wheezing and stridor.   Cardiovascular: Positive for chest pain. Negative for palpitations and leg swelling.  Gastrointestinal: Negative for abdominal distention, abdominal pain, anal bleeding, blood in stool, constipation, diarrhea, nausea, rectal pain and vomiting.  Skin: Negative for color change, pallor, rash and wound.     Physical Exam Triage Vital Signs ED Triage Vitals  Enc Vitals Group     BP 10/23/18 1501 139/72     Pulse Rate 10/23/18 1501 80       Resp 10/23/18 1501 18     Temp 10/23/18 1501 (!) 97.2 F (36.2 C)     Temp Source 10/23/18 1501 Oral     SpO2 10/23/18 1501 97 %     Weight --      Height --      Head Circumference --      Peak Flow --      Pain Score 10/23/18 1459 9     Pain Loc --      Pain Edu? --      Excl. in [place]? --    No data found.  Updated Vital Signs BP 139/72 (BP Location: Left Arm)   Pulse 80   Temp (!) 97.2 F (36.2 C) (Oral)   Resp 18   SpO2 97%   Visual Acuity Right Eye Distance:   Left Eye Distance:   Bilateral Distance:    Right Eye Near:   Left Eye Near:    Bilateral Near:     Physical Exam Vitals signs and nursing note reviewed.  Constitutional:      General: He is not in acute distress.    Appearance: Normal appearance. He is well-developed. He is not ill-appearing, toxic-appearing or diaphoretic.  HENT:     Head: Normocephalic and atraumatic.     Right Ear: Tympanic membrane and ear canal normal.     Left Ear: Tympanic membrane and ear canal normal.     Nose: Nose normal.     Mouth/Throat:     Pharynx: Oropharynx is clear.  Eyes:     Conjunctiva/sclera: Conjunctivae normal.  Neck:     Musculoskeletal: Normal range of motion and neck supple.  Cardiovascular:     Rate and Rhythm: Normal rate and regular rhythm.     Heart sounds: No murmur.  Pulmonary:     Effort: Pulmonary effort is normal. No respiratory distress.     Breath sounds: Normal breath sounds.  Abdominal:     Palpations: Abdomen is soft.     Tenderness: There is no abdominal tenderness.  Musculoskeletal: Normal range of motion.  General: No tenderness.  Skin:    General: Skin is warm and dry.  Neurological:     Mental Status: He is alert.  Psychiatric:        Mood and Affect: Mood normal.      UC Treatments / Results  Labs (all labs ordered are listed, but only abnormal results are displayed) Labs Reviewed - No data to display  EKG None  Radiology Dg Chest 2 View  Result Date:  10/23/2018 CLINICAL DATA:  Chest pain. EXAM: CHEST - 2 VIEW COMPARISON:  03/17/2014 FINDINGS: The heart is mildly enlarged. There is tortuosity of the thoracic aorta. The pulmonary hila appear normal. A loop recorder is noted. Streaky bibasilar subsegmental atelectasis but no infiltrates, edema or effusions. No pneumothorax. The bony thorax is intact. IMPRESSION: Streaky bibasilar atelectasis but no infiltrates or effusions. Mild cardiac enlargement. Electronically Signed   By: Marijo Sanes M.D.   On: 10/23/2018 15:56    Procedures Procedures (including critical care time)  Medications Ordered in UC Medications - No data to display  Initial Impression / Assessment and Plan / UC Course  I have reviewed the triage vital signs and the nursing notes.  Pertinent labs & imaging results that were available during my care of the patient were reviewed by me and considered in my medical decision making (see chart for details).     Pt is an 83 year old male with significant PMH.  He is having 4 days of intermittent left sided chest pain, worse with certain movements.  His exam is benign and he is non toxic or ill appearing. The pain is somewhat reproducible. His EKG showed no change from prior EKGs Chest x-ray was normal There is no high concern for ACS, PE or CHF exacerbation.  I feel it is safe to send patient home and have him take extra strength Tylenol and monitor his symptoms Strict precautions and instructions that if his symptoms worsen to include more severe chest pain, shortness of breath he needs to go the ER immediately. Patient understanding agree to plan Final Clinical Impressions(s) / UC Diagnoses   Final diagnoses:  Chest pain, unspecified type     Discharge Instructions     Your EKG was similar to previous EKG with no acute or new changes. Your x-ray did not show any pneumonia I believe that your pain may be muscle pain based on the fact that it is worse when you bend over  or sneeze. You can keep taking the Tylenol extra strength every 6-8 hours for pain If your chest pain worsens or you start developing any shortness of breath, dizziness, increased swelling in the lower extremities please go to the ER    ED Prescriptions    None     Controlled Substance Prescriptions [place] Controlled Substance Registry consulted? Not Applicable   Orvan July, NP 10/24/18 1009    Loura Halt A, NP 10/24/18 1010

## 2018-10-23 NOTE — ED Triage Notes (Signed)
Chest a congestion for 4 days. Pain in chest with bending forward, coughing, sneezing, or blowing nose or lifting left arm.  Non-productive cough.  Unknown if running fever

## 2018-11-02 ENCOUNTER — Ambulatory Visit: Payer: Medicare Other

## 2018-11-02 ENCOUNTER — Encounter: Payer: Self-pay | Admitting: Podiatry

## 2018-11-02 ENCOUNTER — Ambulatory Visit (INDEPENDENT_AMBULATORY_CARE_PROVIDER_SITE_OTHER): Payer: Medicare Other | Admitting: Podiatry

## 2018-11-02 DIAGNOSIS — M79674 Pain in right toe(s): Secondary | ICD-10-CM

## 2018-11-02 DIAGNOSIS — I6350 Cerebral infarction due to unspecified occlusion or stenosis of unspecified cerebral artery: Secondary | ICD-10-CM | POA: Diagnosis not present

## 2018-11-02 DIAGNOSIS — Z5181 Encounter for therapeutic drug level monitoring: Secondary | ICD-10-CM

## 2018-11-02 DIAGNOSIS — M79675 Pain in left toe(s): Secondary | ICD-10-CM | POA: Diagnosis not present

## 2018-11-02 DIAGNOSIS — D689 Coagulation defect, unspecified: Secondary | ICD-10-CM

## 2018-11-02 DIAGNOSIS — I482 Chronic atrial fibrillation, unspecified: Secondary | ICD-10-CM

## 2018-11-02 DIAGNOSIS — B351 Tinea unguium: Secondary | ICD-10-CM | POA: Diagnosis not present

## 2018-11-02 DIAGNOSIS — E1142 Type 2 diabetes mellitus with diabetic polyneuropathy: Secondary | ICD-10-CM

## 2018-11-02 LAB — POCT INR: INR: 2.4 (ref 2.0–3.0)

## 2018-11-02 NOTE — Patient Instructions (Signed)
Description   Continue on same dosage 2 tablets everyday except 1 tablet on Mondays and Fridays. Recheck in 6 weeks. Call our office if you have any concerns [phone].

## 2018-11-02 NOTE — Progress Notes (Signed)
Complaint:  Visit Type: Patient returns to my office for continued preventative foot care services. Complaint: Patient states" my nails have grown long and thick and become painful to walk and wear shoes" Patient has been diagnosed with DM with no foot complications. The patient presents for preventative foot care services. No changes to ROS  Podiatric Exam: Vascular: dorsalis pedis and posterior tibial pulses are weakly  palpable bilateral. Capillary return is immediate. Temperature gradient is WNL. Skin turgor WNL  Sensorium: Diminished  Semmes Weinstein monofilament test. Normal tactile sensation bilaterally. Nail Exam: Pt has thick disfigured discolored nails with subungual debris noted bilateral entire nail hallux through fifth toenails Ulcer Exam: There is no evidence of ulcer or pre-ulcerative changes or infection. Orthopedic Exam: Muscle tone and strength are WNL. No limitations in general ROM. No crepitus or effusions noted. Foot type and digits show no abnormalities. HAV  B/L. Skin: No Porokeratosis. No infection or ulcers  Diagnosis:  Onychomycosis, , Pain in right toe, pain in left toes  Treatment & Plan Procedures and Treatment: Consent by patient was obtained for treatment procedures.   Debridement of mycotic and hypertrophic toenails, 1 through 5 bilateral and clearing of subungual debris. No ulceration, no infection noted.  Return Visit-Office Procedure: Patient instructed to return to the office for a follow up visit 3 months for continued evaluation and treatment. To make an appointment with Liliane Channel if he desires shoes.    Gardiner Barefoot DPM

## 2018-11-07 DIAGNOSIS — E119 Type 2 diabetes mellitus without complications: Secondary | ICD-10-CM | POA: Diagnosis not present

## 2018-11-20 ENCOUNTER — Other Ambulatory Visit: Payer: Self-pay | Admitting: Cardiology

## 2018-11-26 DIAGNOSIS — I509 Heart failure, unspecified: Secondary | ICD-10-CM | POA: Diagnosis not present

## 2018-11-26 DIAGNOSIS — I10 Essential (primary) hypertension: Secondary | ICD-10-CM | POA: Diagnosis not present

## 2018-11-26 DIAGNOSIS — I2510 Atherosclerotic heart disease of native coronary artery without angina pectoris: Secondary | ICD-10-CM | POA: Diagnosis not present

## 2018-11-26 DIAGNOSIS — N400 Enlarged prostate without lower urinary tract symptoms: Secondary | ICD-10-CM | POA: Diagnosis not present

## 2018-11-28 ENCOUNTER — Other Ambulatory Visit: Payer: Self-pay | Admitting: Cardiology

## 2018-11-28 DIAGNOSIS — E119 Type 2 diabetes mellitus without complications: Secondary | ICD-10-CM | POA: Diagnosis not present

## 2018-11-28 DIAGNOSIS — I10 Essential (primary) hypertension: Secondary | ICD-10-CM

## 2018-11-28 DIAGNOSIS — E785 Hyperlipidemia, unspecified: Secondary | ICD-10-CM

## 2018-11-28 DIAGNOSIS — Z5181 Encounter for therapeutic drug level monitoring: Secondary | ICD-10-CM

## 2018-11-28 DIAGNOSIS — I482 Chronic atrial fibrillation, unspecified: Secondary | ICD-10-CM

## 2018-12-08 ENCOUNTER — Other Ambulatory Visit: Payer: Self-pay | Admitting: Cardiology

## 2018-12-13 ENCOUNTER — Telehealth: Payer: Self-pay

## 2018-12-13 DIAGNOSIS — I10 Essential (primary) hypertension: Secondary | ICD-10-CM | POA: Diagnosis not present

## 2018-12-13 DIAGNOSIS — I69359 Hemiplegia and hemiparesis following cerebral infarction affecting unspecified side: Secondary | ICD-10-CM | POA: Diagnosis not present

## 2018-12-13 DIAGNOSIS — E1142 Type 2 diabetes mellitus with diabetic polyneuropathy: Secondary | ICD-10-CM | POA: Diagnosis not present

## 2018-12-13 DIAGNOSIS — I509 Heart failure, unspecified: Secondary | ICD-10-CM | POA: Diagnosis not present

## 2018-12-13 NOTE — Telephone Encounter (Signed)

## 2018-12-14 ENCOUNTER — Ambulatory Visit (INDEPENDENT_AMBULATORY_CARE_PROVIDER_SITE_OTHER): Payer: Medicare Other

## 2018-12-14 ENCOUNTER — Other Ambulatory Visit: Payer: Self-pay

## 2018-12-14 DIAGNOSIS — I6350 Cerebral infarction due to unspecified occlusion or stenosis of unspecified cerebral artery: Secondary | ICD-10-CM | POA: Diagnosis not present

## 2018-12-14 DIAGNOSIS — I482 Chronic atrial fibrillation, unspecified: Secondary | ICD-10-CM

## 2018-12-14 DIAGNOSIS — Z5181 Encounter for therapeutic drug level monitoring: Secondary | ICD-10-CM

## 2018-12-14 DIAGNOSIS — I480 Paroxysmal atrial fibrillation: Secondary | ICD-10-CM

## 2018-12-14 LAB — POCT INR: INR: 1.7 — AB (ref 2.0–3.0)

## 2018-12-18 DIAGNOSIS — Z7984 Long term (current) use of oral hypoglycemic drugs: Secondary | ICD-10-CM | POA: Diagnosis not present

## 2018-12-18 DIAGNOSIS — E1165 Type 2 diabetes mellitus with hyperglycemia: Secondary | ICD-10-CM | POA: Diagnosis not present

## 2018-12-18 DIAGNOSIS — E1142 Type 2 diabetes mellitus with diabetic polyneuropathy: Secondary | ICD-10-CM | POA: Diagnosis not present

## 2018-12-19 ENCOUNTER — Other Ambulatory Visit: Payer: Self-pay | Admitting: Cardiology

## 2018-12-20 ENCOUNTER — Other Ambulatory Visit: Payer: Self-pay | Admitting: Cardiology

## 2018-12-20 DIAGNOSIS — I482 Chronic atrial fibrillation, unspecified: Secondary | ICD-10-CM

## 2018-12-20 DIAGNOSIS — Z5181 Encounter for therapeutic drug level monitoring: Secondary | ICD-10-CM

## 2018-12-20 DIAGNOSIS — E785 Hyperlipidemia, unspecified: Secondary | ICD-10-CM

## 2018-12-20 DIAGNOSIS — I2583 Coronary atherosclerosis due to lipid rich plaque: Secondary | ICD-10-CM

## 2018-12-20 DIAGNOSIS — I2510 Atherosclerotic heart disease of native coronary artery without angina pectoris: Secondary | ICD-10-CM

## 2018-12-20 DIAGNOSIS — I4821 Permanent atrial fibrillation: Secondary | ICD-10-CM

## 2018-12-20 DIAGNOSIS — I209 Angina pectoris, unspecified: Secondary | ICD-10-CM

## 2018-12-20 DIAGNOSIS — I5023 Acute on chronic systolic (congestive) heart failure: Secondary | ICD-10-CM

## 2018-12-20 DIAGNOSIS — Z01812 Encounter for preprocedural laboratory examination: Secondary | ICD-10-CM

## 2018-12-20 DIAGNOSIS — R9439 Abnormal result of other cardiovascular function study: Secondary | ICD-10-CM

## 2018-12-20 DIAGNOSIS — I10 Essential (primary) hypertension: Secondary | ICD-10-CM

## 2019-01-07 DIAGNOSIS — N400 Enlarged prostate without lower urinary tract symptoms: Secondary | ICD-10-CM | POA: Diagnosis not present

## 2019-01-07 DIAGNOSIS — E1165 Type 2 diabetes mellitus with hyperglycemia: Secondary | ICD-10-CM | POA: Diagnosis not present

## 2019-01-07 DIAGNOSIS — I2510 Atherosclerotic heart disease of native coronary artery without angina pectoris: Secondary | ICD-10-CM | POA: Diagnosis not present

## 2019-01-07 DIAGNOSIS — I10 Essential (primary) hypertension: Secondary | ICD-10-CM | POA: Diagnosis not present

## 2019-01-10 ENCOUNTER — Telehealth: Payer: Self-pay

## 2019-01-10 NOTE — Telephone Encounter (Signed)

## 2019-01-11 ENCOUNTER — Other Ambulatory Visit: Payer: Self-pay

## 2019-01-11 ENCOUNTER — Ambulatory Visit (INDEPENDENT_AMBULATORY_CARE_PROVIDER_SITE_OTHER): Payer: Medicare Other | Admitting: Pharmacist

## 2019-01-11 DIAGNOSIS — Z5181 Encounter for therapeutic drug level monitoring: Secondary | ICD-10-CM

## 2019-01-11 DIAGNOSIS — I480 Paroxysmal atrial fibrillation: Secondary | ICD-10-CM | POA: Diagnosis not present

## 2019-01-11 DIAGNOSIS — I6350 Cerebral infarction due to unspecified occlusion or stenosis of unspecified cerebral artery: Secondary | ICD-10-CM | POA: Diagnosis not present

## 2019-01-11 DIAGNOSIS — I482 Chronic atrial fibrillation, unspecified: Secondary | ICD-10-CM | POA: Diagnosis not present

## 2019-01-11 LAB — POCT INR: INR: 1.3 — AB (ref 2.0–3.0)

## 2019-01-24 ENCOUNTER — Telehealth: Payer: Self-pay

## 2019-01-24 NOTE — Telephone Encounter (Signed)
lmom for prescreen  

## 2019-01-24 NOTE — Telephone Encounter (Signed)
1. COVID-19 Pre-Screening Questions:  . In the past 7 to 10 days have you had a cough,  shortness of breath, headache, congestion, fever (100 or greater) body aches, chills, sore throat, or sudden loss of taste or sense of smell?c/o SOB not new for pt . Have you been around anyone with known Covid 19.NO . Have you been around anyone who is awaiting Covid 19 test results in the past 7 to 10 days?NO . Have you been around anyone who has been exposed to Covid 19, or has mentioned symptoms of Covid 19 within the past 7 to 10 days?NO   2. Pt advised of visitor restrictions (no visitors allowed except if needed to conduct the visit). Also advised to arrive at appointment time and wear a mask.

## 2019-01-30 ENCOUNTER — Encounter: Payer: Self-pay | Admitting: Podiatry

## 2019-01-30 ENCOUNTER — Other Ambulatory Visit: Payer: Self-pay

## 2019-01-30 ENCOUNTER — Telehealth: Payer: Self-pay

## 2019-01-30 ENCOUNTER — Ambulatory Visit (INDEPENDENT_AMBULATORY_CARE_PROVIDER_SITE_OTHER): Payer: Medicare Other | Admitting: Podiatry

## 2019-01-30 VITALS — Temp 96.6°F

## 2019-01-30 DIAGNOSIS — M79674 Pain in right toe(s): Secondary | ICD-10-CM

## 2019-01-30 DIAGNOSIS — M79675 Pain in left toe(s): Secondary | ICD-10-CM | POA: Diagnosis not present

## 2019-01-30 DIAGNOSIS — E1142 Type 2 diabetes mellitus with diabetic polyneuropathy: Secondary | ICD-10-CM

## 2019-01-30 DIAGNOSIS — D689 Coagulation defect, unspecified: Secondary | ICD-10-CM

## 2019-01-30 DIAGNOSIS — B351 Tinea unguium: Secondary | ICD-10-CM | POA: Diagnosis not present

## 2019-01-30 NOTE — Progress Notes (Signed)
Complaint:  Visit Type: Patient returns to my office for continued preventative foot care services. Complaint: Patient states" my nails have grown long and thick and become painful to walk and wear shoes" Patient has been diagnosed with DM with no foot complications. The patient presents for preventative foot care services. No changes to ROS  Podiatric Exam: Vascular: dorsalis pedis and posterior tibial pulses are weakly  palpable bilateral. Capillary return is immediate. Temperature gradient is WNL. Skin turgor WNL  Sensorium: Diminished  Semmes Weinstein monofilament test. Normal tactile sensation bilaterally. Nail Exam: Pt has thick disfigured discolored nails with subungual debris noted bilateral entire nail hallux through fifth toenails Ulcer Exam: There is no evidence of ulcer or pre-ulcerative changes or infection. Orthopedic Exam: Muscle tone and strength are WNL. No limitations in general ROM. No crepitus or effusions noted. Foot type and digits show no abnormalities. HAV  B/L. Skin: No Porokeratosis. No infection or ulcers  Diagnosis:  Onychomycosis, , Pain in right toe, pain in left toes  Treatment & Plan Procedures and Treatment: Consent by patient was obtained for treatment procedures.   Debridement of mycotic and hypertrophic toenails, 1 through 5 bilateral and clearing of subungual debris. No ulceration, no infection noted.  Return Visit-Office Procedure: Patient instructed to return to the office for a follow up visit 3 months for continued evaluation and treatment.    Gardiner Barefoot DPM

## 2019-01-30 NOTE — Telephone Encounter (Signed)
lmom for prescreen  

## 2019-02-04 ENCOUNTER — Ambulatory Visit (INDEPENDENT_AMBULATORY_CARE_PROVIDER_SITE_OTHER): Payer: Medicare Other | Admitting: *Deleted

## 2019-02-04 ENCOUNTER — Other Ambulatory Visit: Payer: Self-pay

## 2019-02-04 DIAGNOSIS — I482 Chronic atrial fibrillation, unspecified: Secondary | ICD-10-CM

## 2019-02-04 DIAGNOSIS — I480 Paroxysmal atrial fibrillation: Secondary | ICD-10-CM

## 2019-02-04 DIAGNOSIS — I6350 Cerebral infarction due to unspecified occlusion or stenosis of unspecified cerebral artery: Secondary | ICD-10-CM | POA: Diagnosis not present

## 2019-02-04 DIAGNOSIS — Z5181 Encounter for therapeutic drug level monitoring: Secondary | ICD-10-CM | POA: Diagnosis not present

## 2019-02-04 LAB — POCT INR: INR: 1.8 — AB (ref 2.0–3.0)

## 2019-02-04 NOTE — Patient Instructions (Signed)
Description   Take 2 tablets today and then start taking 2 tablets daily. Call our office if you have any concerns [phone].

## 2019-02-11 ENCOUNTER — Telehealth: Payer: Self-pay

## 2019-02-11 NOTE — Telephone Encounter (Signed)

## 2019-02-14 ENCOUNTER — Telehealth: Payer: Self-pay | Admitting: *Deleted

## 2019-02-14 NOTE — Telephone Encounter (Signed)

## 2019-02-18 ENCOUNTER — Other Ambulatory Visit: Payer: Self-pay

## 2019-02-18 ENCOUNTER — Ambulatory Visit: Payer: Medicare Other

## 2019-02-18 DIAGNOSIS — I482 Chronic atrial fibrillation, unspecified: Secondary | ICD-10-CM

## 2019-02-18 DIAGNOSIS — I6350 Cerebral infarction due to unspecified occlusion or stenosis of unspecified cerebral artery: Secondary | ICD-10-CM | POA: Diagnosis not present

## 2019-02-18 DIAGNOSIS — Z5181 Encounter for therapeutic drug level monitoring: Secondary | ICD-10-CM | POA: Diagnosis not present

## 2019-02-18 LAB — POCT INR: INR: 2.2 (ref 2.0–3.0)

## 2019-02-18 NOTE — Patient Instructions (Signed)
Description   Continue on same dosage 2 tablets daily. Call our office if you have any concerns [phone].

## 2019-02-22 ENCOUNTER — Other Ambulatory Visit: Payer: Self-pay | Admitting: Cardiology

## 2019-02-25 ENCOUNTER — Other Ambulatory Visit: Payer: Self-pay | Admitting: Cardiology

## 2019-02-25 MED ORDER — ATORVASTATIN CALCIUM 40 MG PO TABS: 40.0000 mg | ORAL_TABLET | Freq: Every day | ORAL | 0 refills | Status: DC

## 2019-03-09 ENCOUNTER — Other Ambulatory Visit: Payer: Self-pay | Admitting: Cardiology

## 2019-03-14 ENCOUNTER — Telehealth: Payer: Self-pay

## 2019-03-14 NOTE — Telephone Encounter (Signed)

## 2019-03-18 ENCOUNTER — Other Ambulatory Visit: Payer: Self-pay

## 2019-03-18 ENCOUNTER — Ambulatory Visit (INDEPENDENT_AMBULATORY_CARE_PROVIDER_SITE_OTHER): Payer: Medicare Other | Admitting: Pharmacist

## 2019-03-18 DIAGNOSIS — I6350 Cerebral infarction due to unspecified occlusion or stenosis of unspecified cerebral artery: Secondary | ICD-10-CM

## 2019-03-18 DIAGNOSIS — Z5181 Encounter for therapeutic drug level monitoring: Secondary | ICD-10-CM

## 2019-03-18 DIAGNOSIS — I482 Chronic atrial fibrillation, unspecified: Secondary | ICD-10-CM | POA: Diagnosis not present

## 2019-03-18 LAB — POCT INR: INR: 2.3 (ref 2.0–3.0)

## 2019-03-18 NOTE — Patient Instructions (Signed)
Description   Continue on same dosage 2 tablets daily. Recheck INR in 4 weeks. Call our office if you have any concerns [phone].

## 2019-03-24 ENCOUNTER — Other Ambulatory Visit: Payer: Self-pay | Admitting: Cardiology

## 2019-03-25 ENCOUNTER — Other Ambulatory Visit: Payer: Self-pay | Admitting: Cardiology

## 2019-03-31 ENCOUNTER — Other Ambulatory Visit: Payer: Self-pay | Admitting: Cardiology

## 2019-04-17 DIAGNOSIS — G464 Cerebellar stroke syndrome: Secondary | ICD-10-CM | POA: Diagnosis not present

## 2019-04-17 DIAGNOSIS — H401133 Primary open-angle glaucoma, bilateral, severe stage: Secondary | ICD-10-CM | POA: Diagnosis not present

## 2019-04-17 DIAGNOSIS — H2512 Age-related nuclear cataract, left eye: Secondary | ICD-10-CM | POA: Diagnosis not present

## 2019-04-17 DIAGNOSIS — H35033 Hypertensive retinopathy, bilateral: Secondary | ICD-10-CM | POA: Diagnosis not present

## 2019-04-18 ENCOUNTER — Encounter (INDEPENDENT_AMBULATORY_CARE_PROVIDER_SITE_OTHER): Payer: Self-pay

## 2019-04-18 ENCOUNTER — Ambulatory Visit (INDEPENDENT_AMBULATORY_CARE_PROVIDER_SITE_OTHER): Payer: Medicare Other | Admitting: *Deleted

## 2019-04-18 ENCOUNTER — Other Ambulatory Visit: Payer: Self-pay

## 2019-04-18 DIAGNOSIS — Z5181 Encounter for therapeutic drug level monitoring: Secondary | ICD-10-CM

## 2019-04-18 DIAGNOSIS — I6350 Cerebral infarction due to unspecified occlusion or stenosis of unspecified cerebral artery: Secondary | ICD-10-CM | POA: Diagnosis not present

## 2019-04-18 DIAGNOSIS — I482 Chronic atrial fibrillation, unspecified: Secondary | ICD-10-CM

## 2019-04-18 LAB — POCT INR: INR: 2.3 (ref 2.0–3.0)

## 2019-04-18 NOTE — Patient Instructions (Addendum)
Description   Continue on same dosage 2 tablets daily. Recheck INR in 5 weeks. Call our office if you have any concerns [phone].

## 2019-04-23 DIAGNOSIS — I10 Essential (primary) hypertension: Secondary | ICD-10-CM | POA: Diagnosis not present

## 2019-04-23 DIAGNOSIS — Z23 Encounter for immunization: Secondary | ICD-10-CM | POA: Diagnosis not present

## 2019-04-23 DIAGNOSIS — E1142 Type 2 diabetes mellitus with diabetic polyneuropathy: Secondary | ICD-10-CM | POA: Diagnosis not present

## 2019-04-23 DIAGNOSIS — I2510 Atherosclerotic heart disease of native coronary artery without angina pectoris: Secondary | ICD-10-CM | POA: Diagnosis not present

## 2019-04-24 DIAGNOSIS — N400 Enlarged prostate without lower urinary tract symptoms: Secondary | ICD-10-CM | POA: Diagnosis not present

## 2019-04-24 DIAGNOSIS — I10 Essential (primary) hypertension: Secondary | ICD-10-CM | POA: Diagnosis not present

## 2019-04-24 DIAGNOSIS — I509 Heart failure, unspecified: Secondary | ICD-10-CM | POA: Diagnosis not present

## 2019-04-24 DIAGNOSIS — E785 Hyperlipidemia, unspecified: Secondary | ICD-10-CM | POA: Diagnosis not present

## 2019-05-08 ENCOUNTER — Encounter: Payer: Self-pay | Admitting: Podiatry

## 2019-05-08 ENCOUNTER — Ambulatory Visit (INDEPENDENT_AMBULATORY_CARE_PROVIDER_SITE_OTHER): Payer: Medicare Other | Admitting: Podiatry

## 2019-05-08 ENCOUNTER — Other Ambulatory Visit: Payer: Self-pay

## 2019-05-08 DIAGNOSIS — D689 Coagulation defect, unspecified: Secondary | ICD-10-CM

## 2019-05-08 DIAGNOSIS — M79675 Pain in left toe(s): Secondary | ICD-10-CM

## 2019-05-08 DIAGNOSIS — E1142 Type 2 diabetes mellitus with diabetic polyneuropathy: Secondary | ICD-10-CM

## 2019-05-08 DIAGNOSIS — B351 Tinea unguium: Secondary | ICD-10-CM

## 2019-05-08 DIAGNOSIS — M79674 Pain in right toe(s): Secondary | ICD-10-CM

## 2019-05-08 NOTE — Progress Notes (Signed)
Complaint:  Visit Type: Patient returns to my office for continued preventative foot care services. Complaint: Patient states" my nails have grown long and thick and become painful to walk and wear shoes" Patient has been diagnosed with DM with no foot complications. The patient presents for preventative foot care services. No changes to ROS  Podiatric Exam: Vascular: dorsalis pedis and posterior tibial pulses are weakly  palpable bilateral. Capillary return is immediate. Temperature gradient is WNL. Skin turgor WNL  Sensorium: Diminished  Semmes Weinstein monofilament test. Normal tactile sensation bilaterally. Nail Exam: Pt has thick disfigured discolored nails with subungual debris noted bilateral entire nail hallux through fifth toenails Ulcer Exam: There is no evidence of ulcer or pre-ulcerative changes or infection. Orthopedic Exam: Muscle tone and strength are WNL. No limitations in general ROM. No crepitus or effusions noted. Foot type and digits show no abnormalities. HAV  B/L. Skin: No Porokeratosis. No infection or ulcers  Diagnosis:  Onychomycosis, , Pain in right toe, pain in left toes  Treatment & Plan Procedures and Treatment: Consent by patient was obtained for treatment procedures.   Debridement of mycotic and hypertrophic toenails, 1 through 5 bilateral and clearing of subungual debris. No ulceration, no infection noted.  Return Visit-Office Procedure: Patient instructed to return to the office for a follow up visit 3 months for continued evaluation and treatment.    Dov Dill DPM 

## 2019-05-17 ENCOUNTER — Other Ambulatory Visit: Payer: Self-pay | Admitting: Cardiology

## 2019-05-23 ENCOUNTER — Encounter (INDEPENDENT_AMBULATORY_CARE_PROVIDER_SITE_OTHER): Payer: Self-pay

## 2019-05-23 ENCOUNTER — Ambulatory Visit: Payer: Medicare Other

## 2019-05-23 ENCOUNTER — Other Ambulatory Visit: Payer: Self-pay

## 2019-05-23 DIAGNOSIS — I6350 Cerebral infarction due to unspecified occlusion or stenosis of unspecified cerebral artery: Secondary | ICD-10-CM

## 2019-05-23 DIAGNOSIS — Z5181 Encounter for therapeutic drug level monitoring: Secondary | ICD-10-CM

## 2019-05-23 DIAGNOSIS — I482 Chronic atrial fibrillation, unspecified: Secondary | ICD-10-CM

## 2019-05-23 LAB — POCT INR: INR: 2 (ref 2.0–3.0)

## 2019-05-23 NOTE — Patient Instructions (Signed)
Description   Take 3 tablets today, then resume same dosage 2 tablets daily. Recheck INR in 6 weeks. Call our office if you have any concerns [phone].

## 2019-05-29 DIAGNOSIS — N400 Enlarged prostate without lower urinary tract symptoms: Secondary | ICD-10-CM | POA: Diagnosis not present

## 2019-05-29 DIAGNOSIS — E785 Hyperlipidemia, unspecified: Secondary | ICD-10-CM | POA: Diagnosis not present

## 2019-05-29 DIAGNOSIS — I10 Essential (primary) hypertension: Secondary | ICD-10-CM | POA: Diagnosis not present

## 2019-05-29 DIAGNOSIS — I509 Heart failure, unspecified: Secondary | ICD-10-CM | POA: Diagnosis not present

## 2019-06-01 ENCOUNTER — Other Ambulatory Visit: Payer: Self-pay | Admitting: Cardiology

## 2019-06-01 DIAGNOSIS — I10 Essential (primary) hypertension: Secondary | ICD-10-CM

## 2019-06-01 DIAGNOSIS — E785 Hyperlipidemia, unspecified: Secondary | ICD-10-CM

## 2019-06-01 DIAGNOSIS — I482 Chronic atrial fibrillation, unspecified: Secondary | ICD-10-CM

## 2019-06-01 DIAGNOSIS — Z5181 Encounter for therapeutic drug level monitoring: Secondary | ICD-10-CM

## 2019-06-09 ENCOUNTER — Other Ambulatory Visit: Payer: Self-pay | Admitting: Cardiology

## 2019-06-13 ENCOUNTER — Other Ambulatory Visit: Payer: Self-pay | Admitting: Cardiology

## 2019-06-13 DIAGNOSIS — I10 Essential (primary) hypertension: Secondary | ICD-10-CM

## 2019-06-13 DIAGNOSIS — Z5181 Encounter for therapeutic drug level monitoring: Secondary | ICD-10-CM

## 2019-06-13 DIAGNOSIS — E785 Hyperlipidemia, unspecified: Secondary | ICD-10-CM

## 2019-06-13 DIAGNOSIS — I482 Chronic atrial fibrillation, unspecified: Secondary | ICD-10-CM

## 2019-06-17 DIAGNOSIS — Z125 Encounter for screening for malignant neoplasm of prostate: Secondary | ICD-10-CM | POA: Diagnosis not present

## 2019-06-17 DIAGNOSIS — N400 Enlarged prostate without lower urinary tract symptoms: Secondary | ICD-10-CM | POA: Diagnosis not present

## 2019-06-17 DIAGNOSIS — E785 Hyperlipidemia, unspecified: Secondary | ICD-10-CM | POA: Diagnosis not present

## 2019-06-17 DIAGNOSIS — R3915 Urgency of urination: Secondary | ICD-10-CM | POA: Diagnosis not present

## 2019-06-17 DIAGNOSIS — I509 Heart failure, unspecified: Secondary | ICD-10-CM | POA: Diagnosis not present

## 2019-06-17 DIAGNOSIS — I10 Essential (primary) hypertension: Secondary | ICD-10-CM | POA: Diagnosis not present

## 2019-06-17 DIAGNOSIS — N401 Enlarged prostate with lower urinary tract symptoms: Secondary | ICD-10-CM | POA: Diagnosis not present

## 2019-06-17 DIAGNOSIS — R351 Nocturia: Secondary | ICD-10-CM | POA: Diagnosis not present

## 2019-06-22 ENCOUNTER — Other Ambulatory Visit: Payer: Self-pay | Admitting: Cardiology

## 2019-06-22 DIAGNOSIS — Z01812 Encounter for preprocedural laboratory examination: Secondary | ICD-10-CM

## 2019-06-22 DIAGNOSIS — I209 Angina pectoris, unspecified: Secondary | ICD-10-CM

## 2019-06-22 DIAGNOSIS — I2510 Atherosclerotic heart disease of native coronary artery without angina pectoris: Secondary | ICD-10-CM

## 2019-06-22 DIAGNOSIS — I5023 Acute on chronic systolic (congestive) heart failure: Secondary | ICD-10-CM

## 2019-06-22 DIAGNOSIS — I10 Essential (primary) hypertension: Secondary | ICD-10-CM

## 2019-06-22 DIAGNOSIS — R9439 Abnormal result of other cardiovascular function study: Secondary | ICD-10-CM

## 2019-06-22 DIAGNOSIS — I2583 Coronary atherosclerosis due to lipid rich plaque: Secondary | ICD-10-CM

## 2019-06-22 DIAGNOSIS — I4821 Permanent atrial fibrillation: Secondary | ICD-10-CM

## 2019-06-23 ENCOUNTER — Other Ambulatory Visit: Payer: Self-pay | Admitting: Cardiology

## 2019-06-23 ENCOUNTER — Encounter: Payer: Self-pay | Admitting: Physician Assistant
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
## 2019-06-25 ENCOUNTER — Encounter: Payer: Self-pay | Admitting: Physician Assistant

## 2019-06-25 ENCOUNTER — Ambulatory Visit: Payer: Medicare Other | Admitting: Physician Assistant

## 2019-06-25 ENCOUNTER — Other Ambulatory Visit: Payer: Self-pay

## 2019-06-25 VITALS — BP 118/68 | HR 70 | Ht 69.0 in | Wt 191.4 lb

## 2019-06-25 DIAGNOSIS — I472 Ventricular tachycardia: Secondary | ICD-10-CM

## 2019-06-25 DIAGNOSIS — I2510 Atherosclerotic heart disease of native coronary artery without angina pectoris: Secondary | ICD-10-CM | POA: Diagnosis not present

## 2019-06-25 DIAGNOSIS — I5022 Chronic systolic (congestive) heart failure: Secondary | ICD-10-CM

## 2019-06-25 DIAGNOSIS — I4821 Permanent atrial fibrillation: Secondary | ICD-10-CM

## 2019-06-25 DIAGNOSIS — I4729 Other ventricular tachycardia: Secondary | ICD-10-CM

## 2019-06-25 DIAGNOSIS — R001 Bradycardia, unspecified: Secondary | ICD-10-CM

## 2019-06-25 LAB — BASIC METABOLIC PANEL
BUN/Creatinine Ratio: 14 (ref 10–24)
BUN: 14 mg/dL (ref 8–27)
CO2: 25 mmol/L (ref 20–29)
Calcium: 9.1 mg/dL (ref 8.6–10.2)
Chloride: 102 mmol/L (ref 96–106)
Creatinine, Ser: 1.03 mg/dL (ref 0.76–1.27)
GFR calc Af Amer: 78 mL/min/{1.73_m2} (ref >59)
GFR calc non Af Amer: 68 mL/min/{1.73_m2} (ref >59)
Glucose: 186 mg/dL — ABNORMAL HIGH (ref 65–99)
Potassium: 4.2 mmol/L (ref 3.5–5.2)
Sodium: 138 mmol/L (ref 134–144)

## 2019-06-25 LAB — MAGNESIUM: Magnesium: 1.8 mg/dL (ref 1.6–2.3)

## 2019-06-25 NOTE — Patient Instructions (Signed)
Medication Instructions:  Your physician recommends that you continue on your current medications as directed. Please refer to the Current Medication list given to you today.  *If you need a refill on your cardiac medications before your next appointment, please call your pharmacy*  Lab Work: TODAY:  BMET & MAG  If you have labs (blood work) drawn today and your tests are completely normal, you will receive your results only by: Marland Kitchen MyChart Message (if you have MyChart) OR . A paper copy in the mail If you have any lab test that is abnormal or we need to change your treatment, we will call you to review the results.  Testing/Procedures: None ordered  Follow-Up: At [facility], you and your health needs are our priority.  As part of our continuing mission to provide you with exceptional heart care, we have created designated Provider Care Teams.  These Care Teams include your primary Cardiologist (physician) and Advanced Practice Providers (APPs -  Physician Assistants and Nurse Practitioners) who all work together to provide you with the care you need, when you need it.  Your next appointment:   6 months  The format for your next appointment:   In Person  Provider:   You may see Ena Dawley, MD or one of the following Advanced Practice Providers on your designated Care Team:    Melina Copa, PA-C  Ermalinda Barrios, PA-C   Other Instructions

## 2019-06-26 ENCOUNTER — Telehealth: Payer: Self-pay | Admitting: *Deleted

## 2019-06-26 DIAGNOSIS — R790 Abnormal level of blood mineral: Secondary | ICD-10-CM

## 2019-06-26 NOTE — Telephone Encounter (Signed)
-----   Message from Charlie Pitter, [place] sent at 06/25/2019  5:30 PM EDT ----- Please let pt know labs are stable except blood sugar is elevated. Magnesium is slightly below goal of 2.0 given his history of rhythm abnormalities. If he thinks there is room to work on dietary increases can start there and recheck Mg in 2 weeks (increase dietary intake of healthy sources of magnesium including leafy greens, nuts, seeds, fish, beans, whole grains, avocados, yogurt, and bananas). However, if he thinks he will not likely change his diet would start MagOx 400mg  daily and rehcheck in 1-2 weeks. Dayna Dunn PA-C

## 2019-06-26 NOTE — Telephone Encounter (Signed)
Call placed to pt re: lab results, left a message for pt to call back.  

## 2019-06-28 DIAGNOSIS — D708 Other neutropenia: Secondary | ICD-10-CM | POA: Diagnosis not present

## 2019-07-02 NOTE — Telephone Encounter (Signed)
2nd attempt to reach pt re: lab results.  Left another message for pt to call back.

## 2019-07-04 ENCOUNTER — Ambulatory Visit: Payer: Medicare Other

## 2019-07-04 ENCOUNTER — Other Ambulatory Visit: Payer: Self-pay | Admitting: Cardiology

## 2019-07-04 ENCOUNTER — Other Ambulatory Visit: Payer: Self-pay

## 2019-07-04 DIAGNOSIS — I482 Chronic atrial fibrillation, unspecified: Secondary | ICD-10-CM

## 2019-07-04 DIAGNOSIS — I10 Essential (primary) hypertension: Secondary | ICD-10-CM

## 2019-07-04 DIAGNOSIS — Z01812 Encounter for preprocedural laboratory examination: Secondary | ICD-10-CM

## 2019-07-04 DIAGNOSIS — Z5181 Encounter for therapeutic drug level monitoring: Secondary | ICD-10-CM

## 2019-07-04 DIAGNOSIS — R9439 Abnormal result of other cardiovascular function study: Secondary | ICD-10-CM

## 2019-07-04 DIAGNOSIS — I6350 Cerebral infarction due to unspecified occlusion or stenosis of unspecified cerebral artery: Secondary | ICD-10-CM | POA: Diagnosis not present

## 2019-07-04 DIAGNOSIS — I5023 Acute on chronic systolic (congestive) heart failure: Secondary | ICD-10-CM

## 2019-07-04 DIAGNOSIS — E7849 Other hyperlipidemia: Secondary | ICD-10-CM

## 2019-07-04 DIAGNOSIS — I4821 Permanent atrial fibrillation: Secondary | ICD-10-CM

## 2019-07-04 DIAGNOSIS — I2510 Atherosclerotic heart disease of native coronary artery without angina pectoris: Secondary | ICD-10-CM

## 2019-07-04 LAB — POCT INR: INR: 2.5 (ref 2.0–3.0)

## 2019-07-04 NOTE — Patient Instructions (Signed)
Description   Continue on same dosage 2 tablets daily. Recheck INR in 6 weeks. Call our office if you have any concerns [phone].

## 2019-07-09 ENCOUNTER — Encounter: Payer: Self-pay | Admitting: *Deleted

## 2019-07-09 NOTE — Telephone Encounter (Signed)
I have been unable to reach this patient by phone.  A letter is being sent to the last known home address.

## 2019-07-17 MED ORDER — MAGNESIUM 400 MG PO TABS: 400.0000 mg | ORAL_TABLET | Freq: Every day | ORAL | Status: DC

## 2019-07-17 NOTE — Telephone Encounter (Signed)
lpmtcb 11/18

## 2019-07-17 NOTE — Telephone Encounter (Signed)
The patient has been notified of the result and verbalized understanding.  All questions (if any) were answered. Frederik Schmidt, RN 07/17/2019 9:20 AM    The patient will start Magnesium 400 mg Daily and recheck Magnesium 12/2.

## 2019-07-17 NOTE — Telephone Encounter (Signed)
Follow up    Please return call with lab results

## 2019-07-17 NOTE — Addendum Note (Signed)
Addended by: Frederik Schmidt on: 07/17/2019 09:26 AM   Modules accepted: Orders

## 2019-07-23 DIAGNOSIS — R3915 Urgency of urination: Secondary | ICD-10-CM | POA: Diagnosis not present

## 2019-07-23 DIAGNOSIS — N401 Enlarged prostate with lower urinary tract symptoms: Secondary | ICD-10-CM | POA: Diagnosis not present

## 2019-07-26 DIAGNOSIS — E785 Hyperlipidemia, unspecified: Secondary | ICD-10-CM | POA: Diagnosis not present

## 2019-07-26 DIAGNOSIS — I10 Essential (primary) hypertension: Secondary | ICD-10-CM | POA: Diagnosis not present

## 2019-07-26 DIAGNOSIS — I509 Heart failure, unspecified: Secondary | ICD-10-CM | POA: Diagnosis not present

## 2019-07-26 DIAGNOSIS — N400 Enlarged prostate without lower urinary tract symptoms: Secondary | ICD-10-CM | POA: Diagnosis not present

## 2019-07-31 ENCOUNTER — Other Ambulatory Visit: Payer: Medicare Other | Admitting: *Deleted

## 2019-07-31 ENCOUNTER — Other Ambulatory Visit: Payer: Self-pay

## 2019-07-31 DIAGNOSIS — R790 Abnormal level of blood mineral: Secondary | ICD-10-CM

## 2019-07-31 LAB — MAGNESIUM: Magnesium: 1.7 mg/dL (ref 1.6–2.3)

## 2019-08-01 ENCOUNTER — Telehealth: Payer: Self-pay | Admitting: *Deleted

## 2019-08-01 DIAGNOSIS — Z79899 Other long term (current) drug therapy: Secondary | ICD-10-CM

## 2019-08-01 NOTE — Telephone Encounter (Signed)
Call placed to pt re: lab results, left a message for pt to call back.  

## 2019-08-01 NOTE — Telephone Encounter (Signed)
-----   Message from Charlie Pitter, [place] sent at 08/01/2019 12:35 PM EST ----- Mg level is actually below where it was before. Please verify pt has been taking Mag Ox 400mg  once daily. If not, start and get mag level when he returns for next coumadin visit. Please make sure he's tolerating it OK. If he has been taking the magnesium faithfully, would increase to BID and get mag level at next coumadin visit. Dayna Dunn PA-C

## 2019-08-02 NOTE — Telephone Encounter (Signed)
Follow Up  Pt is calling back for lab results  Please call

## 2019-08-02 NOTE — Telephone Encounter (Signed)
Returned call to pt.  He will pick up rx for Mag Ox today and will repeat Mag level 08/15/2019.

## 2019-08-07 ENCOUNTER — Ambulatory Visit: Payer: Medicare Other | Admitting: Podiatry

## 2019-08-07 ENCOUNTER — Encounter: Payer: Self-pay | Admitting: Podiatry

## 2019-08-07 ENCOUNTER — Other Ambulatory Visit: Payer: Self-pay

## 2019-08-07 DIAGNOSIS — E1159 Type 2 diabetes mellitus with other circulatory complications: Secondary | ICD-10-CM

## 2019-08-07 DIAGNOSIS — D689 Coagulation defect, unspecified: Secondary | ICD-10-CM | POA: Diagnosis not present

## 2019-08-07 DIAGNOSIS — B351 Tinea unguium: Secondary | ICD-10-CM | POA: Diagnosis not present

## 2019-08-07 DIAGNOSIS — E1142 Type 2 diabetes mellitus with diabetic polyneuropathy: Secondary | ICD-10-CM

## 2019-08-07 DIAGNOSIS — M79674 Pain in right toe(s): Secondary | ICD-10-CM

## 2019-08-07 DIAGNOSIS — M79675 Pain in left toe(s): Secondary | ICD-10-CM

## 2019-08-07 NOTE — Progress Notes (Signed)
Complaint:  Visit Type: Patient returns to my office for continued preventative foot care services. Complaint: Patient states" my nails have grown long and thick and become painful to walk and wear shoes" Patient has been diagnosed with DM with no foot complications. The patient presents for preventative foot care services. No changes to ROS  Podiatric Exam: Vascular: dorsalis pedis and posterior tibial pulses are weakly  palpable bilateral. Capillary return is immediate. Temperature gradient is WNL. Skin turgor WNL  Sensorium: Diminished  Semmes Weinstein monofilament test. Normal tactile sensation bilaterally. Nail Exam: Pt has thick disfigured discolored nails with subungual debris noted bilateral entire nail hallux through fifth toenails Ulcer Exam: There is no evidence of ulcer or pre-ulcerative changes or infection. Orthopedic Exam: Muscle tone and strength are WNL. No limitations in general ROM. No crepitus or effusions noted. Foot type and digits show no abnormalities. HAV  B/L. Skin: No Porokeratosis. No infection or ulcers  Diagnosis:  Onychomycosis, , Pain in right toe, pain in left toes  Treatment & Plan Procedures and Treatment: Consent by patient was obtained for treatment procedures.   Debridement of mycotic and hypertrophic toenails, 1 through 5 bilateral and clearing of subungual debris. No ulceration, no infection noted.  Return Visit-Office Procedure: Patient instructed to return to the office for a follow up visit 3 months for continued evaluation and treatment.    Gardiner Barefoot DPM

## 2019-08-15 ENCOUNTER — Ambulatory Visit: Payer: Medicare Other | Admitting: *Deleted

## 2019-08-15 ENCOUNTER — Other Ambulatory Visit: Payer: Self-pay

## 2019-08-15 ENCOUNTER — Other Ambulatory Visit: Payer: Medicare Other | Admitting: *Deleted

## 2019-08-15 DIAGNOSIS — I6350 Cerebral infarction due to unspecified occlusion or stenosis of unspecified cerebral artery: Secondary | ICD-10-CM | POA: Diagnosis not present

## 2019-08-15 DIAGNOSIS — I482 Chronic atrial fibrillation, unspecified: Secondary | ICD-10-CM | POA: Diagnosis not present

## 2019-08-15 DIAGNOSIS — Z79899 Other long term (current) drug therapy: Secondary | ICD-10-CM

## 2019-08-15 DIAGNOSIS — Z5181 Encounter for therapeutic drug level monitoring: Secondary | ICD-10-CM | POA: Diagnosis not present

## 2019-08-15 LAB — POCT INR: INR: 4.5 — AB (ref 2.0–3.0)

## 2019-08-15 NOTE — Patient Instructions (Signed)
Description   Do not take any Warfarin today and No Warfarin tomorrow then continue on same dosage 2 tablets daily. Recheck INR in 3 weeks (usually 6 weeks). Call our office if you have any concerns [phone].

## 2019-08-16 LAB — MAGNESIUM: Magnesium: 1.8 mg/dL (ref 1.6–2.3)

## 2019-08-19 ENCOUNTER — Other Ambulatory Visit: Payer: Self-pay | Admitting: Cardiology

## 2019-08-19 ENCOUNTER — Telehealth: Payer: Self-pay | Admitting: *Deleted

## 2019-08-19 DIAGNOSIS — Z79899 Other long term (current) drug therapy: Secondary | ICD-10-CM

## 2019-08-19 NOTE — Telephone Encounter (Signed)
-----   Message from Charlie Pitter, [place] sent at 08/19/2019 11:23 AM EST ----- Please let pt know his magnesium level is only marginally better than the last one. Still below goal given his history. Can you verify he got the rx? Do not see it was actually sent in on 07/17/19, Legrand Como added as OTC. When we talked to him on 07/31/19, he had not picked it up yet. Please find out if he did, since level hasn't changed much. If he did, increase to 400mg  BID and get repeat mag level when he comes in for next coumadin check on 09/05/19 since he will be here anyway.

## 2019-08-19 NOTE — Telephone Encounter (Signed)
Called pt re: lab results and left a message for pt to call back.

## 2019-08-20 ENCOUNTER — Other Ambulatory Visit: Payer: Self-pay | Admitting: Cardiology

## 2019-08-20 MED ORDER — MAGNESIUM OXIDE 400 MG PO TABS: 400.0000 mg | ORAL_TABLET | Freq: Every day | ORAL | 1 refills | Status: DC

## 2019-08-20 NOTE — Telephone Encounter (Signed)
Patient returning call in regards to lab results. He says if he does not answer [phone] then you can try to reach him at  [phone].

## 2019-08-21 DIAGNOSIS — H2512 Age-related nuclear cataract, left eye: Secondary | ICD-10-CM | POA: Diagnosis not present

## 2019-08-21 DIAGNOSIS — H35033 Hypertensive retinopathy, bilateral: Secondary | ICD-10-CM | POA: Diagnosis not present

## 2019-08-21 DIAGNOSIS — G464 Cerebellar stroke syndrome: Secondary | ICD-10-CM | POA: Diagnosis not present

## 2019-08-21 DIAGNOSIS — H401133 Primary open-angle glaucoma, bilateral, severe stage: Secondary | ICD-10-CM | POA: Diagnosis not present

## 2019-08-26 DIAGNOSIS — I509 Heart failure, unspecified: Secondary | ICD-10-CM | POA: Diagnosis not present

## 2019-08-26 DIAGNOSIS — I10 Essential (primary) hypertension: Secondary | ICD-10-CM | POA: Diagnosis not present

## 2019-08-26 DIAGNOSIS — E785 Hyperlipidemia, unspecified: Secondary | ICD-10-CM | POA: Diagnosis not present

## 2019-08-26 DIAGNOSIS — N400 Enlarged prostate without lower urinary tract symptoms: Secondary | ICD-10-CM | POA: Diagnosis not present

## 2019-09-03 ENCOUNTER — Other Ambulatory Visit: Payer: Medicare Other

## 2019-09-05 ENCOUNTER — Other Ambulatory Visit: Payer: Self-pay

## 2019-09-05 ENCOUNTER — Other Ambulatory Visit: Payer: Medicare Other | Admitting: *Deleted

## 2019-09-05 ENCOUNTER — Ambulatory Visit (INDEPENDENT_AMBULATORY_CARE_PROVIDER_SITE_OTHER): Payer: Medicare Other | Admitting: *Deleted

## 2019-09-05 DIAGNOSIS — Z79899 Other long term (current) drug therapy: Secondary | ICD-10-CM

## 2019-09-05 DIAGNOSIS — I482 Chronic atrial fibrillation, unspecified: Secondary | ICD-10-CM | POA: Diagnosis not present

## 2019-09-05 DIAGNOSIS — I6350 Cerebral infarction due to unspecified occlusion or stenosis of unspecified cerebral artery: Secondary | ICD-10-CM

## 2019-09-05 DIAGNOSIS — Z5181 Encounter for therapeutic drug level monitoring: Secondary | ICD-10-CM | POA: Diagnosis not present

## 2019-09-05 LAB — MAGNESIUM: Magnesium: 1.7 mg/dL (ref 1.6–2.3)

## 2019-09-05 LAB — POCT INR: INR: 4.3 — AB (ref 2.0–3.0)

## 2019-09-05 NOTE — Patient Instructions (Signed)
Description   Do not take any Warfarin today and No Warfarin tomorrow then start taking 2 tablets daily except 1 tablet on Tuesdays. Recheck INR in 3 weeks (usually 6 weeks). Call our office if you have any concerns [phone].

## 2019-09-06 ENCOUNTER — Telehealth: Payer: Self-pay | Admitting: Physician Assistant

## 2019-09-06 MED ORDER — MAGNESIUM OXIDE 400 MG PO TABS: 400.0000 mg | ORAL_TABLET | Freq: Two times a day (BID) | ORAL | 3 refills | Status: DC

## 2019-09-06 NOTE — Telephone Encounter (Signed)
Returned call to Pt.  Pt states he is currently taking magox daily.  Advised to increase to twice a day.  Advised will recheck labs at next f/u.  Pt indicates understanding. Advised would send his new script to his pharmacy.

## 2019-09-06 NOTE — Telephone Encounter (Signed)
Pt is calling back to get results.

## 2019-09-07 ENCOUNTER — Other Ambulatory Visit: Payer: Self-pay | Admitting: Cardiology

## 2019-09-07 DIAGNOSIS — I10 Essential (primary) hypertension: Secondary | ICD-10-CM

## 2019-09-07 DIAGNOSIS — I482 Chronic atrial fibrillation, unspecified: Secondary | ICD-10-CM

## 2019-09-07 DIAGNOSIS — E785 Hyperlipidemia, unspecified: Secondary | ICD-10-CM

## 2019-09-07 DIAGNOSIS — Z5181 Encounter for therapeutic drug level monitoring: Secondary | ICD-10-CM

## 2019-09-14 ENCOUNTER — Other Ambulatory Visit: Payer: Self-pay | Admitting: Cardiology

## 2019-09-15 ENCOUNTER — Ambulatory Visit (HOSPITAL_COMMUNITY)
Admission: EM | Admit: 2019-09-15 | Discharge: 2019-09-15 | Disposition: A | Discharge status: Home / Self Care | Payer: Medicare Other | Attending: Family Medicine | Admitting: Family Medicine

## 2019-09-15 ENCOUNTER — Other Ambulatory Visit: Payer: Self-pay

## 2019-09-15 ENCOUNTER — Encounter (HOSPITAL_COMMUNITY): Payer: Self-pay

## 2019-09-15 DIAGNOSIS — M5441 Lumbago with sciatica, right side: Secondary | ICD-10-CM

## 2019-09-15 MED ORDER — TIZANIDINE HCL 2 MG PO TABS: 2.0000 mg | ORAL_TABLET | Freq: Three times a day (TID) | ORAL | 0 refills | Status: DC | PRN

## 2019-09-15 MED ORDER — ACETAMINOPHEN 500 MG PO TABS: 500.0000 mg | ORAL_TABLET | Freq: Three times a day (TID) | ORAL | 0 refills | Status: DC | PRN

## 2019-09-15 NOTE — ED Triage Notes (Signed)
Pt states he woke up with lower back pain x 1 week ago. Pt states she has not had any injuries to his back or anything.

## 2019-09-15 NOTE — ED Provider Notes (Signed)
[place]    CSN: [id number] Arrival date & time: 09/15/19  1204      History   Chief Complaint Chief Complaint  Patient presents with  . Back Pain    HPI Vincent [name] is a 84 y.o. male.   Vincent [name] presents with complaints of bilateral low back pain which started a few weeks ago and hasn't improved. Worse with transitioning of positions. Hasn't worsened but hasn't gotten better. No known trigger or known injury. Denies any previous similar, on chart review was seen 02/2016 with lumbar strain. He notes that sometimes he feels the pain in both buttocks, although he feels that the left is greater than the right. Denies any numbness tingling or weakness to extremities. No new bowel or bladder incontinence (follows with urology historically). No saddle symptoms. Hasn't taken any medications for pain. History  Of CHF, COPD, CAD, gerd, htn, afib, stroke, chronic coumadin.      ROS per HPI, negative if not otherwise mentioned.      Past Medical History:  Diagnosis Date  . Alcoholic cardiomyopathy ([place])   . Bradycardia   . Chronic systolic CHF (congestive heart failure) ([place])   . 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 COPD (chronic obstructive pulmonary disease) ([place])   . Coronary artery disease    a. s/p remote stent to Cx 2004 with chronic stable angina in context of residual circumflex disease.  . Foot drop, left 03/11/2015  . Gait disorder 03/11/2015  . GERD (gastroesophageal reflux disease)   . Glaucoma   . Gout   . Hemiparesis and alteration of sensations as late effects of stroke ([place]) 09/25/2015  . Hyperlipidemia   . Hypertension   . Long term (current) use of anticoagulants   . 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 NSVT (nonsustained ventricular tachycardia) ([place])   . Permanent atrial fibrillation ([place])   . Pulmonary eosinophilia   . Sinus bradycardia   . Stroke ([place])   . Syncope and collapse   . Unspecified  glaucoma(365.9)     Patient Active Problem List   Diagnosis Date Noted  . Chest pain 11/25/2015  . 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 Hemiparesis and alteration of sensations as late effects of stroke ([place]) 09/25/2015  . Abnormal stress test 07/21/2015  . Foot drop, left 03/11/2015  . Gait disorder 03/11/2015  . Precordial pain 03/19/2014  . Dizziness 03/17/2014  . Near syncope 03/17/2014  . Encounter for therapeutic drug monitoring 09/24/2013  . Rectal bleeding 10/19/2012  . External hemorrhoid, bleeding 10/19/2012  . Long term current use of anticoagulant 10/12/2010  . GLAUCOMA 09/17/2010  . GERD 09/17/2010  . HEMORRHAGE OF RECTUM AND ANUS 09/17/2010  . BRADYCARDIA 06/09/2010  . VENTRICULAR TACHYCARDIA 12/30/2008  . SYNCOPE 12/30/2008  . PULMONARY INFILTRATE INCLUDES (EOSINOPHILIA) 12/28/2007  . HLD (hyperlipidemia) 12/27/2007  . Essential hypertension 12/27/2007  . Coronary atherosclerosis 12/27/2007  . Alcoholic cardiomyopathy ([place]) 12/27/2007  . PAROXYSMAL ATRIAL FIBRILLATION 12/27/2007  . Cerebral artery occlusion with cerebral infarction ([place]) 12/27/2007  . COUGH 12/27/2007    Past Surgical History:  Procedure Laterality Date  . CARDIAC CATHETERIZATION N/A 07/30/2015   Procedure: Left Heart Cath and Coronary Angiography;  Surgeon: Belva Crome, MD;  Location: [facility];  Service: Cardiovascular;  Laterality: N/A;  . COLONOSCOPY    . KNEE SURGERY    . loop recorder  09/2007  Home Medications    Prior to Admission medications   Medication Sig Start Date End Date Taking? Authorizing Provider  ACCU-CHEK FASTCLIX LANCETS MISC USE TO CHECK BLOOD SUGAR QD 06/05/18   [provider]  ACCU-CHEK GUIDE test strip USE TO CHECK BLOOD SUGAR QD 06/07/18   [provider]  acetaminophen (TYLENOL) 500 MG tablet Take 1 tablet (500 mg total) by mouth every 8 (eight) hours as needed for mild pain or moderate pain. 09/15/19    Zigmund Gottron, NP  aspirin 81 MG chewable tablet Chew 81 mg by mouth daily.    [provider]  atorvastatin (LIPITOR) 40 MG tablet Take 1 tablet (40 mg total) by mouth daily at 6 PM. Please make annual appt with Dr. Meda Coffee for refills. [phone]. 1st attempt. 08/20/19   [name], MD  brimonidine (ALPHAGAN) 0.2 % ophthalmic solution Place 1 drop into both eyes 2 (two) times daily. 07/30/16   [provider]  carvedilol (COREG) 6.25 MG tablet Take 1 tablet (6.25 mg total) by mouth 2 (two) times daily with a meal. 09/09/19   [name], MD  colchicine 0.6 MG tablet Take 0.6 mg by mouth as needed.     [provider]  Continuous Blood Gluc Sensor (FREESTYLE LIBRE 14 DAY SENSOR) MISC USE AS DIRECTED EVERY 14 DAYS. 03/05/19   [provider]  famotidine (PEPCID) 20 MG tablet Take 20 mg by mouth 2 (two) times daily.    [provider]  ferrous sulfate 325 (65 FE) MG tablet Take 325 mg by mouth daily with breakfast.    [provider]  fluocinonide cream (LIDEX) [id number] % Apply 1 application topically daily as needed (itching).  01/13/15   [provider]  furosemide (LASIX) 20 MG tablet TAKE 1 TABLET(20 MG) BY MOUTH DAILY 07/05/19   [name], MD  hydrocortisone (ANUSOL-HC) 2.5 % rectal cream Place 1 application rectally 2 (two) times daily as needed for hemorrhoids or itching. 07/27/16   Levin Erp, PA  hydrocortisone cream 1 % Apply 1 application topically 2 (two) times daily as needed (for eczmea).    [provider]  ipratropium (ATROVENT) 0.06 % nasal spray Place 2 sprays into both nostrils 4 (four) times daily. Patient taking differently: Place 2 sprays into both nostrils as needed.  11/21/16   [name], NP  irbesartan (AVAPRO) 300 MG tablet TAKE 1 TABLET BY MOUTH EVERY NIGHT AT BEDTIME 06/26/19   [name], MD  isosorbide mononitrate (IMDUR) 30 MG 24 hr tablet TAKE 1 TABLET(30  MG) BY MOUTH DAILY 06/26/19   [name], MD  latanoprost (XALATAN) 0.005 % ophthalmic solution Place 1 drop into both eyes at bedtime.    [provider]  loratadine (CLARITIN) 10 MG tablet Take 10 mg by mouth daily.    [provider]  magnesium oxide (MAG-OX) 400 MG tablet Take 1 tablet (400 mg total) by mouth 2 (two) times daily. 09/06/19   Dunn, Nedra Hai, PA-C  meclizine (ANTIVERT) 25 MG tablet Take 25 mg by mouth 3 (three) times daily as needed for dizziness.    [provider]  metFORMIN (GLUCOPHAGE) 500 MG tablet Take 500 mg by mouth 2 (two) times daily. 06/16/19   [provider]  nitrofurantoin, macrocrystal-monohydrate, (MACROBID) 100 MG capsule Take 100 mg by mouth at bedtime.    [provider]  nitroGLYCERIN (NITROLINGUAL) 0.4 MG/SPRAY spray Place 1 spray under the tongue every 5 (five) minutes x  3 doses as needed for chest pain. 10/18/16   [name], MD  Omega-3 Fatty Acids (FISH OIL) 1000 MG CAPS Take 1,000 mg by mouth 2 (two) times daily.     [provider]  pseudoephedrine (SUDAFED) 60 MG tablet Take 60 mg by mouth every 4 (four) hours as needed for congestion.    [provider]  spironolactone (ALDACTONE) 25 MG tablet TAKE 1 TABLET(25 MG) BY MOUTH DAILY 06/14/19   [name], MD  Tamsulosin HCl (FLOMAX) 0.4 MG CAPS Take 0.4 mg by mouth daily. 12/28/11   [provider]  tiZANidine (ZANAFLEX) 2 MG tablet Take 1 tablet (2 mg total) by mouth every 8 (eight) hours as needed for muscle spasms (and low back pain). 09/15/19   Augusto Gamble B, NP  triamcinolone cream (KENALOG) 0.1 % Apply 1 application topically 2 (two) times daily. Eczema 01/20/15   [provider]  warfarin (COUMADIN) 2 MG tablet Take as directed by [facility] 06/10/19   [name], MD    Family History Family History  Problem Relation Age of Onset  . Colon cancer Mother   . Hypertension Mother   .  Cancer Mother   . Prostate cancer Brother   . Cancer Brother   . Heart disease Father   . Heart attack Father   . Hypertension Father   . Heart attack Sister   . Cancer Sister   . Stroke Neg Hx   . Colon polyps Neg Hx   . Esophageal cancer Neg Hx   . Stomach cancer Neg Hx   . Rectal cancer Neg Hx   . Pancreatic cancer Neg Hx     Social History Social History   Tobacco Use  . Smoking status: Former Smoker    Quit date: 08/22/1983    Years since quitting: 36.0  . Smokeless tobacco: Never Used  Substance Use Topics  . Alcohol use: No  . Drug use: No     Allergies   Patient has no known allergies.   Review of Systems Review of Systems   Physical Exam Triage Vital Signs ED Triage Vitals  Enc Vitals Group     BP 09/15/19 1246 135/71     Pulse Rate 09/15/19 1246 68     Resp 09/15/19 1246 16     Temp 09/15/19 1246 99.4 F (37.4 C)     Temp Source 09/15/19 1246 Oral     SpO2 09/15/19 1246 99 %     Weight 09/15/19 1245 192 lb (87.1 kg)     Height --      Head Circumference --      Peak Flow --      Pain Score 09/15/19 1245 10     Pain Loc --      Pain Edu? --      Excl. in [place]? --    No data found.  Updated Vital Signs BP 135/71 (BP Location: Right Arm)   Pulse 68   Temp 99.4 F (37.4 C) (Oral)   Resp 16   Wt 192 lb (87.1 kg)   SpO2 99%   BMI 28.35 kg/m    Physical Exam Constitutional:      Appearance: He is well-developed.  Cardiovascular:     Rate and Rhythm: Normal rate.  Pulmonary:     Effort: Pulmonary effort is normal.  Musculoskeletal:     Lumbar back: Tenderness present. No bony tenderness. Positive right straight leg raise test.  Back:     Comments: Bilateral low back musculature with tenderness, no spinous process tenderness on palpation; noted significant pain with transition from sit to stand; strength equal bilaterally; gross sensation intact to lower extremities; pain to Right back with right hip flexion and with right  straight leg raise  Skin:    General: Skin is warm and dry.  Neurological:     Mental Status: He is alert and oriented to person, place, and time.      UC Treatments / Results  Labs (all labs ordered are listed, but only abnormal results are displayed) Labs Reviewed - No data to display  EKG   Radiology No results found.  Procedures Procedures (including critical care time)  Medications Ordered in UC Medications - No data to display  Initial Impression / Assessment and Plan / UC Course  I have reviewed the triage vital signs and the nursing notes.  Pertinent labs & imaging results that were available during my care of the patient were reviewed by me and considered in my medical decision making (see chart for details).     No known injury. Bilateral musculature with tenderness bilaterally without spinous process tenderness. Pain management discussed, limited by comorbidities and coumadin use, unfortunately. Encourage follow up with PCP for continued management. Patient verbalized understanding and agreeable to plan.   Final Clinical Impressions(s) / UC Diagnoses   Final diagnoses:  Acute bilateral low back pain with right-sided sciatica     Discharge Instructions     Light and regular activity as tolerated.  Heat application while active can help with muscle spasms.  Sleep with pillow under your knees.   Tylenol 1000mg  every 8 hours to help with pain.  Muscle relaxer, tizanidine, before bed, or every 8 hours as needed to help with pain and spasm. Be mindful that this may cause some drowsiness.  Please follow up with your primary care provider for recheck in the next couple of weeks.    ED Prescriptions    Medication Sig Dispense Auth. Provider   acetaminophen (TYLENOL) 500 MG tablet Take 1 tablet (500 mg total) by mouth every 8 (eight) hours as needed for mild pain or moderate pain. 30 tablet Augusto Gamble B, NP   tiZANidine (ZANAFLEX) 2 MG tablet Take 1 tablet  (2 mg total) by mouth every 8 (eight) hours as needed for muscle spasms (and low back pain). 20 tablet Zigmund Gottron, NP     PDMP not reviewed this encounter.   Zigmund Gottron, NP 09/15/19 1438

## 2019-09-15 NOTE — Discharge Instructions (Signed)
Light and regular activity as tolerated.  Heat application while active can help with muscle spasms.  Sleep with pillow under your knees.   Tylenol 1000mg  every 8 hours to help with pain.  Muscle relaxer, tizanidine, before bed, or every 8 hours as needed to help with pain and spasm. Be mindful that this may cause some drowsiness.  Please follow up with your primary care provider for recheck in the next couple of weeks.

## 2019-09-17 ENCOUNTER — Ambulatory Visit
Admission: RE | Admit: 2019-09-17 | Discharge: 2019-09-17 | Disposition: A | Discharge status: Home / Self Care | Payer: Medicare Other | Source: Ambulatory Visit | Attending: Internal Medicine | Admitting: Internal Medicine

## 2019-09-17 ENCOUNTER — Other Ambulatory Visit: Payer: Self-pay | Admitting: Internal Medicine

## 2019-09-17 DIAGNOSIS — M545 Low back pain, unspecified: Secondary | ICD-10-CM

## 2019-09-24 DIAGNOSIS — E785 Hyperlipidemia, unspecified: Secondary | ICD-10-CM | POA: Diagnosis not present

## 2019-09-24 DIAGNOSIS — I10 Essential (primary) hypertension: Secondary | ICD-10-CM | POA: Diagnosis not present

## 2019-09-24 DIAGNOSIS — N400 Enlarged prostate without lower urinary tract symptoms: Secondary | ICD-10-CM | POA: Diagnosis not present

## 2019-09-24 DIAGNOSIS — I509 Heart failure, unspecified: Secondary | ICD-10-CM | POA: Diagnosis not present

## 2019-09-26 ENCOUNTER — Other Ambulatory Visit: Payer: Self-pay

## 2019-09-26 ENCOUNTER — Ambulatory Visit: Payer: Medicare Other | Admitting: *Deleted

## 2019-09-26 DIAGNOSIS — I6350 Cerebral infarction due to unspecified occlusion or stenosis of unspecified cerebral artery: Secondary | ICD-10-CM | POA: Diagnosis not present

## 2019-09-26 DIAGNOSIS — I482 Chronic atrial fibrillation, unspecified: Secondary | ICD-10-CM | POA: Diagnosis not present

## 2019-09-26 DIAGNOSIS — Z5181 Encounter for therapeutic drug level monitoring: Secondary | ICD-10-CM | POA: Diagnosis not present

## 2019-09-26 LAB — POCT INR: INR: 3.2 — AB (ref 2.0–3.0)

## 2019-09-26 NOTE — Patient Instructions (Addendum)
Description   Hold today and then continue taking 2 tablets daily except 1 tablet on Tuesdays. Recheck INR in 3 weeks (usually 6 weeks). Call our office if you have any concerns [phone].

## 2019-10-01 ENCOUNTER — Ambulatory Visit: Payer: Medicare Other | Admitting: Podiatry

## 2019-10-11 DIAGNOSIS — H2512 Age-related nuclear cataract, left eye: Secondary | ICD-10-CM | POA: Diagnosis not present

## 2019-10-11 DIAGNOSIS — H401133 Primary open-angle glaucoma, bilateral, severe stage: Secondary | ICD-10-CM | POA: Diagnosis not present

## 2019-10-11 DIAGNOSIS — E119 Type 2 diabetes mellitus without complications: Secondary | ICD-10-CM | POA: Diagnosis not present

## 2019-10-11 DIAGNOSIS — H35033 Hypertensive retinopathy, bilateral: Secondary | ICD-10-CM | POA: Diagnosis not present

## 2019-10-14 ENCOUNTER — Other Ambulatory Visit: Payer: Self-pay | Admitting: Cardiology

## 2019-10-18 ENCOUNTER — Other Ambulatory Visit: Payer: Self-pay

## 2019-10-18 ENCOUNTER — Ambulatory Visit (INDEPENDENT_AMBULATORY_CARE_PROVIDER_SITE_OTHER): Payer: Medicare Other | Admitting: *Deleted

## 2019-10-18 DIAGNOSIS — I482 Chronic atrial fibrillation, unspecified: Secondary | ICD-10-CM

## 2019-10-18 DIAGNOSIS — Z5181 Encounter for therapeutic drug level monitoring: Secondary | ICD-10-CM | POA: Diagnosis not present

## 2019-10-18 DIAGNOSIS — I6350 Cerebral infarction due to unspecified occlusion or stenosis of unspecified cerebral artery: Secondary | ICD-10-CM | POA: Diagnosis not present

## 2019-10-18 LAB — POCT INR: INR: 2.6 (ref 2.0–3.0)

## 2019-10-18 NOTE — Patient Instructions (Signed)
Description   Continue taking 2 tablets daily except 1 tablet on Tuesdays. Recheck INR in 6 weeks. Call our office if you have any concerns [phone].

## 2019-10-27 DIAGNOSIS — E785 Hyperlipidemia, unspecified: Secondary | ICD-10-CM | POA: Diagnosis not present

## 2019-10-27 DIAGNOSIS — I10 Essential (primary) hypertension: Secondary | ICD-10-CM | POA: Diagnosis not present

## 2019-10-27 DIAGNOSIS — N400 Enlarged prostate without lower urinary tract symptoms: Secondary | ICD-10-CM | POA: Diagnosis not present

## 2019-10-27 DIAGNOSIS — I509 Heart failure, unspecified: Secondary | ICD-10-CM | POA: Diagnosis not present

## 2019-10-30 DIAGNOSIS — I509 Heart failure, unspecified: Secondary | ICD-10-CM | POA: Diagnosis not present

## 2019-10-30 DIAGNOSIS — M109 Gout, unspecified: Secondary | ICD-10-CM | POA: Diagnosis not present

## 2019-10-30 DIAGNOSIS — I10 Essential (primary) hypertension: Secondary | ICD-10-CM | POA: Diagnosis not present

## 2019-10-30 DIAGNOSIS — E1142 Type 2 diabetes mellitus with diabetic polyneuropathy: Secondary | ICD-10-CM | POA: Diagnosis not present

## 2019-11-06 ENCOUNTER — Ambulatory Visit (INDEPENDENT_AMBULATORY_CARE_PROVIDER_SITE_OTHER): Payer: Medicare Other | Admitting: Podiatry

## 2019-11-06 ENCOUNTER — Other Ambulatory Visit: Payer: Self-pay

## 2019-11-06 ENCOUNTER — Encounter: Payer: Self-pay | Admitting: Podiatry

## 2019-11-06 DIAGNOSIS — E1142 Type 2 diabetes mellitus with diabetic polyneuropathy: Secondary | ICD-10-CM

## 2019-11-06 DIAGNOSIS — B351 Tinea unguium: Secondary | ICD-10-CM | POA: Diagnosis not present

## 2019-11-06 DIAGNOSIS — E1159 Type 2 diabetes mellitus with other circulatory complications: Secondary | ICD-10-CM

## 2019-11-06 DIAGNOSIS — D689 Coagulation defect, unspecified: Secondary | ICD-10-CM

## 2019-11-06 DIAGNOSIS — M79675 Pain in left toe(s): Secondary | ICD-10-CM

## 2019-11-06 DIAGNOSIS — M79674 Pain in right toe(s): Secondary | ICD-10-CM

## 2019-11-06 NOTE — Progress Notes (Signed)
This patient returns to my office for at risk foot care.  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 This patient is unable to cut nails themselves since the patient cannot reach their nails.These nails are painful walking and wearing shoes.  This patient presents for at risk foot care today.  General Appearance  Alert, conversant and in no acute stress.  Vascular  Dorsalis pedis and posterior tibial  pulses are weakly/absent palpable  bilaterally.  Capillary return is within normal limits  bilaterally. Temperature is within normal limits  bilaterally.  Neurologic  Senn-Weinstein monofilament wire test within normal limits  bilaterally. Muscle power within normal limits bilaterally.  Nails Thick disfigured discolored nails with subungual debris  from hallux to fifth toes bilaterally. No evidence of bacterial infection or drainage bilaterally.  Orthopedic  No limitations of motion  feet .  No crepitus or effusions noted.  No bony pathology or digital deformities noted.  Skin  normotropic skin with no porokeratosis noted bilaterally.  No signs of infections or ulcers noted.     Onychomycosis  Pain in right toe  Pain in left toe.  Consent was obtained for treatment procedures.  Debridement and grinding of long thick nails with clearing of subungual debris.  No infection or ulcer.     Return office visit   3 months       Told patient to return for periodic foot care and evaluation due to potential at risk complications.   Gardiner Barefoot DPM

## 2019-11-16 ENCOUNTER — Other Ambulatory Visit: Payer: Self-pay | Admitting: Cardiology

## 2019-11-25 DIAGNOSIS — I10 Essential (primary) hypertension: Secondary | ICD-10-CM | POA: Diagnosis not present

## 2019-11-25 DIAGNOSIS — I509 Heart failure, unspecified: Secondary | ICD-10-CM | POA: Diagnosis not present

## 2019-11-25 DIAGNOSIS — N400 Enlarged prostate without lower urinary tract symptoms: Secondary | ICD-10-CM | POA: Diagnosis not present

## 2019-11-25 DIAGNOSIS — E785 Hyperlipidemia, unspecified: Secondary | ICD-10-CM | POA: Diagnosis not present

## 2019-11-28 ENCOUNTER — Other Ambulatory Visit: Payer: Self-pay

## 2019-11-28 ENCOUNTER — Ambulatory Visit: Payer: Medicare Other | Admitting: *Deleted

## 2019-11-28 DIAGNOSIS — Z5181 Encounter for therapeutic drug level monitoring: Secondary | ICD-10-CM | POA: Diagnosis not present

## 2019-11-28 DIAGNOSIS — I482 Chronic atrial fibrillation, unspecified: Secondary | ICD-10-CM | POA: Diagnosis not present

## 2019-11-28 DIAGNOSIS — I6350 Cerebral infarction due to unspecified occlusion or stenosis of unspecified cerebral artery: Secondary | ICD-10-CM

## 2019-11-28 LAB — POCT INR: INR: 2.3 (ref 2.0–3.0)

## 2019-11-28 NOTE — Patient Instructions (Signed)
Description   Continue taking 2 tablets daily except 1 tablet on Tuesdays. Recheck INR in 6 weeks. Call our office if you have any concerns [phone].

## 2019-12-05 ENCOUNTER — Other Ambulatory Visit: Payer: Self-pay | Admitting: Cardiology

## 2019-12-05 DIAGNOSIS — E785 Hyperlipidemia, unspecified: Secondary | ICD-10-CM

## 2019-12-05 DIAGNOSIS — I482 Chronic atrial fibrillation, unspecified: Secondary | ICD-10-CM

## 2019-12-05 DIAGNOSIS — Z5181 Encounter for therapeutic drug level monitoring: Secondary | ICD-10-CM

## 2019-12-05 DIAGNOSIS — I10 Essential (primary) hypertension: Secondary | ICD-10-CM

## 2019-12-11 ENCOUNTER — Other Ambulatory Visit: Payer: Self-pay

## 2019-12-11 DIAGNOSIS — I10 Essential (primary) hypertension: Secondary | ICD-10-CM

## 2019-12-11 DIAGNOSIS — Z7901 Long term (current) use of anticoagulants: Secondary | ICD-10-CM

## 2019-12-11 DIAGNOSIS — Z5181 Encounter for therapeutic drug level monitoring: Secondary | ICD-10-CM

## 2019-12-11 DIAGNOSIS — E785 Hyperlipidemia, unspecified: Secondary | ICD-10-CM

## 2019-12-11 MED ORDER — NITROGLYCERIN 0.4 MG/SPRAY TL SOLN: 1.0000 | 3 refills | Status: DC | PRN

## 2019-12-12 ENCOUNTER — Telehealth: Payer: Self-pay

## 2019-12-12 NOTE — Telephone Encounter (Signed)
**Note De-Identified  Obfuscation** I started a NTG 0.4 mg Spray PA through covermymeds. Key: [id number]

## 2019-12-16 DIAGNOSIS — N400 Enlarged prostate without lower urinary tract symptoms: Secondary | ICD-10-CM | POA: Diagnosis not present

## 2019-12-16 DIAGNOSIS — I509 Heart failure, unspecified: Secondary | ICD-10-CM | POA: Diagnosis not present

## 2019-12-16 DIAGNOSIS — I10 Essential (primary) hypertension: Secondary | ICD-10-CM | POA: Diagnosis not present

## 2019-12-16 DIAGNOSIS — E785 Hyperlipidemia, unspecified: Secondary | ICD-10-CM | POA: Diagnosis not present

## 2019-12-20 NOTE — Telephone Encounter (Signed)
**Note De-Identified  Obfuscation** Letter received from [facility] stating that they approved the pts spray form of NTG 0.4 mg. Approval is good until 04/15/202

## 2019-12-30 DIAGNOSIS — N400 Enlarged prostate without lower urinary tract symptoms: Secondary | ICD-10-CM | POA: Diagnosis not present

## 2019-12-30 DIAGNOSIS — E785 Hyperlipidemia, unspecified: Secondary | ICD-10-CM | POA: Diagnosis not present

## 2019-12-30 DIAGNOSIS — I509 Heart failure, unspecified: Secondary | ICD-10-CM | POA: Diagnosis not present

## 2019-12-30 DIAGNOSIS — I10 Essential (primary) hypertension: Secondary | ICD-10-CM | POA: Diagnosis not present

## 2020-01-08 ENCOUNTER — Ambulatory Visit: Payer: Medicare Other | Admitting: *Deleted

## 2020-01-08 ENCOUNTER — Other Ambulatory Visit: Payer: Self-pay

## 2020-01-08 DIAGNOSIS — I482 Chronic atrial fibrillation, unspecified: Secondary | ICD-10-CM | POA: Diagnosis not present

## 2020-01-08 DIAGNOSIS — I6350 Cerebral infarction due to unspecified occlusion or stenosis of unspecified cerebral artery: Secondary | ICD-10-CM | POA: Diagnosis not present

## 2020-01-08 DIAGNOSIS — Z5181 Encounter for therapeutic drug level monitoring: Secondary | ICD-10-CM

## 2020-01-08 LAB — POCT INR: INR: 2 (ref 2.0–3.0)

## 2020-01-08 NOTE — Patient Instructions (Signed)
Description   Continue taking 2 tablets daily except 1 tablet on Tuesdays. Recheck INR in 6 weeks. Call our office if you have any concerns [phone].

## 2020-01-28 DIAGNOSIS — N400 Enlarged prostate without lower urinary tract symptoms: Secondary | ICD-10-CM | POA: Diagnosis not present

## 2020-01-28 DIAGNOSIS — I10 Essential (primary) hypertension: Secondary | ICD-10-CM | POA: Diagnosis not present

## 2020-01-28 DIAGNOSIS — I509 Heart failure, unspecified: Secondary | ICD-10-CM | POA: Diagnosis not present

## 2020-01-28 DIAGNOSIS — E1165 Type 2 diabetes mellitus with hyperglycemia: Secondary | ICD-10-CM | POA: Diagnosis not present

## 2020-02-11 ENCOUNTER — Ambulatory Visit (INDEPENDENT_AMBULATORY_CARE_PROVIDER_SITE_OTHER): Payer: Medicare Other | Admitting: Podiatry

## 2020-02-11 ENCOUNTER — Encounter: Payer: Self-pay | Admitting: Podiatry

## 2020-02-11 ENCOUNTER — Other Ambulatory Visit: Payer: Self-pay

## 2020-02-11 DIAGNOSIS — D689 Coagulation defect, unspecified: Secondary | ICD-10-CM | POA: Diagnosis not present

## 2020-02-11 DIAGNOSIS — M79675 Pain in left toe(s): Secondary | ICD-10-CM

## 2020-02-11 DIAGNOSIS — B351 Tinea unguium: Secondary | ICD-10-CM

## 2020-02-11 DIAGNOSIS — M79674 Pain in right toe(s): Secondary | ICD-10-CM

## 2020-02-11 DIAGNOSIS — E1142 Type 2 diabetes mellitus with diabetic polyneuropathy: Secondary | ICD-10-CM | POA: Diagnosis not present

## 2020-02-11 NOTE — Progress Notes (Signed)
This patient returns to my office for at risk foot care.  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  This patient is unable to cut nails himself since the patient cannot reach his nails.These nails are painful walking and wearing shoes.  This patient presents for at risk foot care today.  General Appearance  Alert, conversant and in no acute stress.  Vascular  Dorsalis pedis and posterior tibial  pulses are weakly/absent palpable  bilaterally.  Capillary return is within normal limits  bilaterally. Temperature is within normal limits  bilaterally.  Neurologic  Senn-Weinstein monofilament wire test diminished   bilaterally. Muscle power within normal limits bilaterally.  Nails Thick disfigured discolored nails with subungual debris  from hallux to fifth toes bilaterally. No evidence of bacterial infection or drainage bilaterally.  Orthopedic  No limitations of motion  feet .  No crepitus or effusions noted.  No bony pathology or digital deformities noted.  Skin  normotropic skin with no porokeratosis noted bilaterally.  No signs of infections or ulcers noted.     Onychomycosis  Pain in right toe  Pain in left toe.  Consent was obtained for treatment procedures.  Debridement and grinding of long thick nails with clearing of subungual debris.  No infection or ulcer.     Return office visit   3 months       Told patient to return for periodic foot care and evaluation due to potential at risk complications.   Gardiner Barefoot DPM

## 2020-03-03 DIAGNOSIS — N400 Enlarged prostate without lower urinary tract symptoms: Secondary | ICD-10-CM | POA: Diagnosis not present

## 2020-03-03 DIAGNOSIS — E1165 Type 2 diabetes mellitus with hyperglycemia: Secondary | ICD-10-CM | POA: Diagnosis not present

## 2020-03-03 DIAGNOSIS — I509 Heart failure, unspecified: Secondary | ICD-10-CM | POA: Diagnosis not present

## 2020-03-03 DIAGNOSIS — I10 Essential (primary) hypertension: Secondary | ICD-10-CM | POA: Diagnosis not present

## 2020-03-18 ENCOUNTER — Other Ambulatory Visit: Payer: Self-pay

## 2020-03-18 ENCOUNTER — Ambulatory Visit: Payer: Medicare Other | Admitting: *Deleted

## 2020-03-18 DIAGNOSIS — Z5181 Encounter for therapeutic drug level monitoring: Secondary | ICD-10-CM

## 2020-03-18 DIAGNOSIS — I6350 Cerebral infarction due to unspecified occlusion or stenosis of unspecified cerebral artery: Secondary | ICD-10-CM

## 2020-03-18 DIAGNOSIS — I482 Chronic atrial fibrillation, unspecified: Secondary | ICD-10-CM | POA: Diagnosis not present

## 2020-03-18 LAB — POCT INR: INR: 2.1 (ref 2.0–3.0)

## 2020-03-18 NOTE — Patient Instructions (Signed)
Description   Continue taking Warfarin 2 tablets daily except 1 tablet on Tuesdays. Recheck INR in 8 weeks. Call our office if you have any concerns [phone].

## 2020-03-22 ENCOUNTER — Other Ambulatory Visit: Payer: Self-pay | Admitting: Cardiology

## 2020-03-22 DIAGNOSIS — I209 Angina pectoris, unspecified: Secondary | ICD-10-CM

## 2020-03-22 DIAGNOSIS — R9439 Abnormal result of other cardiovascular function study: Secondary | ICD-10-CM

## 2020-03-22 DIAGNOSIS — I5023 Acute on chronic systolic (congestive) heart failure: Secondary | ICD-10-CM

## 2020-03-22 DIAGNOSIS — Z01812 Encounter for preprocedural laboratory examination: Secondary | ICD-10-CM

## 2020-03-22 DIAGNOSIS — I4821 Permanent atrial fibrillation: Secondary | ICD-10-CM

## 2020-03-22 DIAGNOSIS — I10 Essential (primary) hypertension: Secondary | ICD-10-CM

## 2020-03-22 DIAGNOSIS — I2510 Atherosclerotic heart disease of native coronary artery without angina pectoris: Secondary | ICD-10-CM

## 2020-03-31 ENCOUNTER — Ambulatory Visit (INDEPENDENT_AMBULATORY_CARE_PROVIDER_SITE_OTHER): Payer: Medicare Other

## 2020-03-31 ENCOUNTER — Ambulatory Visit (HOSPITAL_COMMUNITY)
Admission: EM | Admit: 2020-03-31 | Discharge: 2020-03-31 | Disposition: A | Discharge status: Home / Self Care | Payer: Medicare Other | Attending: Family Medicine | Admitting: Family Medicine

## 2020-03-31 ENCOUNTER — Encounter (HOSPITAL_COMMUNITY): Payer: Self-pay | Admitting: Emergency Medicine

## 2020-03-31 ENCOUNTER — Other Ambulatory Visit: Payer: Self-pay

## 2020-03-31 DIAGNOSIS — M545 Low back pain, unspecified: Secondary | ICD-10-CM

## 2020-03-31 DIAGNOSIS — M546 Pain in thoracic spine: Secondary | ICD-10-CM | POA: Diagnosis not present

## 2020-03-31 NOTE — Discharge Instructions (Signed)
Please try tylenol  Please follow up with your primary doctor about a bone medicine  Please follow up with your heart doctor.

## 2020-03-31 NOTE — ED Provider Notes (Signed)
[place]    CSN: [id number] Arrival date & time: 03/31/20  1807      History   Chief Complaint Chief Complaint  Patient presents with   Back Pain    HPI Vincent [name] is a 84 y.o. male.   He is presenting with low to mid to upper back pain.  It is paraspinal in nature.  It is worse with transitioning from sitting to standing.  Is gotten worse over the past month.  Has a history of similar back pain.  No specific inciting event or falls.  No numbness or tingling.  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 s/p remote stent to Cx 2004 with chronic stable angina in context of residual circumflex disease.   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/25/2015   NSVT (nonsustained ventricular tachycardia) (HCC)    Permanent atrial fibrillation (HCC)    Pulmonary eosinophilia    Sinus bradycardia    Stroke [facility])    Syncope and collapse    Unspecified glaucoma(365.9)     Patient Active Problem List   Diagnosis Date Noted   Chest pain 11/25/2015   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/25/2015   Hemiparesis and alteration of sensations as late effects of stroke ([place]) 09/25/2015   Abnormal stress test 07/21/2015   Foot drop, left 03/11/2015   Gait disorder 03/11/2015   Precordial pain 03/19/2014   Dizziness 03/17/2014   Near syncope 03/17/2014   Encounter for therapeutic drug monitoring 09/24/2013     Rectal bleeding 10/19/2012   External hemorrhoid, bleeding 10/19/2012   Long term current use of anticoagulant 10/12/2010   GLAUCOMA 09/17/2010   GERD 09/17/2010   HEMORRHAGE OF RECTUM AND ANUS 09/17/2010   BRADYCARDIA 06/09/2010   VENTRICULAR TACHYCARDIA 12/30/2008   SYNCOPE 12/30/2008   PULMONARY INFILTRATE INCLUDES (EOSINOPHILIA) 12/28/2007   HLD (hyperlipidemia) 12/27/2007   Essential hypertension 12/27/2007   Coronary atherosclerosis 08/21/8249   Alcoholic cardiomyopathy ([place]) 12/27/2007   PAROXYSMAL ATRIAL FIBRILLATION 12/27/2007   Cerebral artery occlusion with cerebral infarction ([place]) 12/27/2007   COUGH 12/27/2007    Past Surgical History:  Procedure Laterality Date   CARDIAC CATHETERIZATION N/A 07/30/2015   Procedure: Left Heart Cath and Coronary Angiography;  Surgeon: Belva Crome, MD;  Location: [facility];  Service: Cardiovascular;  Laterality: N/A;   COLONOSCOPY     KNEE SURGERY     loop recorder  09/2007       Home Medications    Prior to Admission medications   Medication Sig Start Date End Date Taking? Authorizing Provider  aspirin 81 MG chewable tablet Chew 81 mg by mouth daily.   Yes [provider]  atorvastatin (LIPITOR) 40 MG tablet TAKE 1 TABLET BY MOUTH DAILY AT 6 PM. MAKE APPT WITH DOCTOR NELSON FOR MORE REFILLS 11/18/19  Yes Dunn, Dayna N, PA-C  carvedilol (  COREG) 6.25 MG tablet Take 1 tablet (6.25 mg total) by mouth 2 (two) times daily with a meal. 09/09/19  Yes [name], MD  famotidine (PEPCID) 20 MG tablet Take 20 mg by mouth 2 (two) times daily.   Yes [provider]  furosemide (LASIX) 20 MG tablet TAKE 1 TABLET(20 MG) BY MOUTH DAILY 07/05/19  Yes [name], MD  irbesartan (AVAPRO) 300 MG tablet TAKE 1 TABLET BY MOUTH EVERY NIGHT AT BEDTIME 03/23/20  Yes [name], MD  isosorbide mononitrate (IMDUR) 30 MG 24 hr tablet TAKE 1 TABLET(30 MG) BY MOUTH DAILY 06/26/19  Yes  [name], MD  JARDIANCE 25 MG TABS tablet Take 25 mg by mouth daily. 02/04/20  Yes [provider]  loratadine (CLARITIN) 10 MG tablet Take 10 mg by mouth daily.   Yes [provider]  magnesium oxide (MAG-OX) 400 MG tablet Take 1 tablet (400 mg total) by mouth 2 (two) times daily. 09/06/19  Yes Dunn, Dayna N, PA-C  metFORMIN (GLUCOPHAGE) 500 MG tablet Take 500 mg by mouth 2 (two) times daily. 06/16/19  Yes [provider]  MYRBETRIQ 25 MG TB24 tablet Take 25 mg by mouth daily. 02/02/20  Yes [provider]  nitrofurantoin, macrocrystal-monohydrate, (MACROBID) 100 MG capsule Take 100 mg by mouth at bedtime.   Yes [provider]  Omega-3 Fatty Acids (FISH OIL) 1000 MG CAPS Take 1,000 mg by mouth 2 (two) times daily.    Yes [provider]  spironolactone (ALDACTONE) 25 MG tablet TAKE 1 TABLET(25 MG) BY MOUTH DAILY 12/05/19  Yes [name], MD  warfarin (COUMADIN) 2 MG tablet TAKE AS DIRECTED BY COUMADIN CLINIC. 12/06/19  Yes [name], MD  ACCU-CHEK FASTCLIX LANCETS MISC USE TO CHECK BLOOD SUGAR QD 06/05/18   [provider]  ACCU-CHEK GUIDE test strip USE TO CHECK BLOOD SUGAR QD 06/07/18   [provider]  acetaminophen (TYLENOL) 500 MG tablet Take 1 tablet (500 mg total) by mouth every 8 (eight) hours as needed for mild pain or moderate pain. 09/15/19   Zigmund Gottron, NP  brimonidine (ALPHAGAN) 0.2 % ophthalmic solution Place 1 drop into both eyes 2 (two) times daily. 07/30/16   [provider]  colchicine 0.6 MG tablet Take 0.6 mg by mouth as needed.     [provider]  Continuous Blood Gluc Sensor (FREESTYLE LIBRE 14 DAY SENSOR) MISC USE AS DIRECTED EVERY 14 DAYS. 03/05/19   [provider]  ferrous sulfate 325 (65 FE) MG tablet Take 325 mg by mouth daily with breakfast.    [provider]  fluocinonide cream (LIDEX) 9.41 % Apply 1 application topically daily as needed  (itching).  01/13/15   [provider]  hydrocortisone (ANUSOL-HC) 2.5 % rectal cream Place 1 application rectally 2 (two) times daily as needed for hemorrhoids or itching. 07/27/16   Levin Erp, PA  hydrocortisone cream 1 % Apply 1 application topically 2 (two) times daily as needed (for eczmea).    [provider]  ipratropium (ATROVENT) 0.06 % nasal spray Place 2 sprays into both nostrils 4 (four) times daily. Patient taking differently: Place 2 sprays into both nostrils as needed.  11/21/16   [name], NP  latanoprost (XALATAN) 0.005 % ophthalmic solution Place 1 drop into both eyes at bedtime.    [provider]  meclizine (ANTIVERT) 25 MG tablet Take 25 mg by mouth 3 (three) times daily as needed for dizziness.  [provider]  nitroGLYCERIN (NITROLINGUAL) 0.4 MG/SPRAY spray Place 1 spray under the tongue every 5 (five) minutes x 3 doses as needed for chest pain. 12/11/19   [name], MD  pseudoephedrine (SUDAFED) 60 MG tablet Take 60 mg by mouth every 4 (four) hours as needed for congestion.    [provider]  ROCKLATAN 0.02-0.005 % SOLN Apply 1 drop to eye at bedtime. 02/03/20   [provider]  Tamsulosin HCl (FLOMAX) 0.4 MG CAPS Take 0.4 mg by mouth daily. 12/28/11   [provider]  tiZANidine (ZANAFLEX) 2 MG tablet Take 1 tablet (2 mg total) by mouth every 8 (eight) hours as needed for muscle spasms (and low back pain). 09/15/19   Zigmund Gottron, NP  traMADol (ULTRAM) 50 MG tablet Take 50 mg by mouth 3 (three) times daily as needed. 09/17/19   [provider]  triamcinolone cream (KENALOG) 0.1 % Apply 1 application topically 2 (two) times daily. Eczema 01/20/15   [provider]    Family History Family History  Problem Relation Age of Onset   Colon cancer Mother    Hypertension Mother    Cancer Mother    Prostate cancer Brother    Cancer Brother    Heart disease Father     Heart attack Father    Hypertension Father    Heart attack Sister    Cancer Sister    Stroke Neg Hx    Colon polyps Neg Hx    Esophageal cancer Neg Hx    Stomach cancer Neg Hx    Rectal cancer Neg Hx    Pancreatic cancer Neg Hx     Social History Social History   Tobacco Use   Smoking status: Former Smoker    Quit date: 08/22/1983    Years since quitting: 36.6   Smokeless tobacco: Never Used  Vaping Use   Vaping Use: Never used  Substance Use Topics   Alcohol use: No   Drug use: No     Allergies   Patient has no known allergies.   Review of Systems Review of Systems  See HPI  Physical Exam Triage Vital Signs ED Triage Vitals  Enc Vitals Group     BP 03/31/20 1924 (!) 148/93     Pulse Rate 03/31/20 1924 (!) 106     Resp 03/31/20 1924 20     Temp 03/31/20 1924 98.7 F (37.1 C)     Temp Source 03/31/20 1924 Oral     SpO2 --      Weight --      Height --      Head Circumference --      Peak Flow --      Pain Score 03/31/20 1918 8     Pain Loc --      Pain Edu? --      Excl. in [place]? --    No data found.  Updated Vital Signs BP (!) 148/93 (BP Location: Left Arm)    Pulse (!) 106    Temp 98.7 F (37.1 C) (Oral)    Resp (!) 34   Visual Acuity Right Eye Distance:   Left Eye Distance:   Bilateral Distance:    Right Eye Near:   Left Eye Near:    Bilateral Near:     Physical Exam Gen: NAD, alert, cooperative with exam, well-appearing ENT: normal lips, normal nasal mucosa,  Eye: normal EOM, normal conjunctiva and lids CV: Irregularly irregular Resp: no accessory muscle  use, non-labored,  Psych:  normal insight, alert and oriented MSK:  Back: No specific area of tenderness along the midline of the thoracic or lumbar spine, Pain with transitioning from sitting to standing. Neurovascular intact   UC Treatments / Results  Labs (all labs ordered are listed, but only abnormal results are displayed) Labs Reviewed - No data to  display  EKG   Radiology DG Thoracic Spine 2 View  Result Date: 03/31/2020 CLINICAL DATA:  84 year old male with low back pain. EXAM: THORACIC SPINE 2 VIEWS; LUMBAR SPINE - COMPLETE 4+ VIEW COMPARISON:  Chest radiograph dated 10/23/2018 FINDINGS: There is an age indeterminate compression fracture of L1 with approximately 25% loss of vertebral body height. Correlation with clinical exam and point tenderness recommended. No other acute fracture identified. The bones are osteopenic. Degenerative changes of the lower lumbar spine with grade 1 L4-L5 anterolisthesis and multilevel facet arthropathy. There is mild cardiomegaly. Atherosclerotic calcification of the aorta. Gallstones noted. IMPRESSION: 1. Age indeterminate compression fracture of L1. Correlation with clinical exam and point tenderness recommended. 2. Osteopenia, degenerative changes, and grade 1 L4-L5 anterolisthesis. Electronically Signed   By: Anner Crete M.D.   On: 03/31/2020 21:05   DG Lumbar Spine Complete  Result Date: 03/31/2020 CLINICAL DATA:  84 year old male with low back pain. EXAM: THORACIC SPINE 2 VIEWS; LUMBAR SPINE - COMPLETE 4+ VIEW COMPARISON:  Chest radiograph dated 10/23/2018 FINDINGS: There is an age indeterminate compression fracture of L1 with approximately 25% loss of vertebral body height. Correlation with clinical exam and point tenderness recommended. No other acute fracture identified. The bones are osteopenic. Degenerative changes of the lower lumbar spine with grade 1 L4-L5 anterolisthesis and multilevel facet arthropathy. There is mild cardiomegaly. Atherosclerotic calcification of the aorta. Gallstones noted. IMPRESSION: 1. Age indeterminate compression fracture of L1. Correlation with clinical exam and point tenderness recommended. 2. Osteopenia, degenerative changes, and grade 1 L4-L5 anterolisthesis. Electronically Signed   By: Anner Crete M.D.   On: 03/31/2020 21:05    Procedures Procedures  (including critical care time)  Medications Ordered in UC Medications - No data to display  Initial Impression / Assessment and Plan / UC Course  I have reviewed the triage vital signs and the nursing notes.  Pertinent labs & imaging results that were available during my care of the patient were reviewed by me and considered in my medical decision making (see chart for details).     Mr. Atallah is an 84 year old male that is presenting with back pain.  X-ray is not showing acute compression fracture.  His symptoms are likely muscle spasm related to degenerative changes seen throughout the back.  Advised following up with his primary care for the consideration of a bone density.  Advise close follow-up with his cardiologist for his A. fib.  Given indications to seek immediate care.  Final Clinical Impressions(s) / UC Diagnoses   Final diagnoses:  Acute left-sided low back pain without sciatica     Discharge Instructions     Please try tylenol  Please follow up with your primary doctor about a bone medicine  Please follow up with your heart doctor.     ED Prescriptions    None     PDMP not reviewed this encounter.   Rosemarie Ax, MD 03/31/20 2115

## 2020-03-31 NOTE — ED Triage Notes (Signed)
back pain for 2 months, getting worse.  Patient thinks he fell 2 months ago.  Pain is mid to lower back.

## 2020-03-31 NOTE — ED Notes (Signed)
Notified dr schmitz that having difficulty obtaining pulse ox on patient,  Respiratory rate is 36, provider notified.

## 2020-04-20 DIAGNOSIS — E1165 Type 2 diabetes mellitus with hyperglycemia: Secondary | ICD-10-CM | POA: Diagnosis not present

## 2020-04-20 DIAGNOSIS — N400 Enlarged prostate without lower urinary tract symptoms: Secondary | ICD-10-CM | POA: Diagnosis not present

## 2020-04-20 DIAGNOSIS — I10 Essential (primary) hypertension: Secondary | ICD-10-CM | POA: Diagnosis not present

## 2020-04-20 DIAGNOSIS — I509 Heart failure, unspecified: Secondary | ICD-10-CM | POA: Diagnosis not present

## 2020-05-12 DIAGNOSIS — I10 Essential (primary) hypertension: Secondary | ICD-10-CM | POA: Diagnosis not present

## 2020-05-12 DIAGNOSIS — E1142 Type 2 diabetes mellitus with diabetic polyneuropathy: Secondary | ICD-10-CM | POA: Diagnosis not present

## 2020-05-12 DIAGNOSIS — Z0000 Encounter for general adult medical examination without abnormal findings: Secondary | ICD-10-CM | POA: Diagnosis not present

## 2020-05-12 DIAGNOSIS — I509 Heart failure, unspecified: Secondary | ICD-10-CM | POA: Diagnosis not present

## 2020-05-12 DIAGNOSIS — Z23 Encounter for immunization: Secondary | ICD-10-CM | POA: Diagnosis not present

## 2020-05-12 DIAGNOSIS — M109 Gout, unspecified: Secondary | ICD-10-CM | POA: Diagnosis not present

## 2020-05-12 DIAGNOSIS — Z1389 Encounter for screening for other disorder: Secondary | ICD-10-CM | POA: Diagnosis not present

## 2020-05-14 ENCOUNTER — Telehealth: Payer: Self-pay

## 2020-05-14 NOTE — Telephone Encounter (Signed)
Called and lmomed missed appt

## 2020-05-18 ENCOUNTER — Telehealth: Payer: Self-pay | Admitting: *Deleted

## 2020-05-18 NOTE — Telephone Encounter (Signed)
Pt is overdue and needs appt; left message for pt to call back to reschedule.

## 2020-05-19 ENCOUNTER — Ambulatory Visit: Payer: Medicare Other | Admitting: Podiatry

## 2020-05-21 ENCOUNTER — Telehealth: Payer: Self-pay | Admitting: *Deleted

## 2020-05-21 NOTE — Telephone Encounter (Signed)
Called pt and left a message for the pt to call back to reschedule his Anticoagulation Appt.

## 2020-05-22 DIAGNOSIS — E1165 Type 2 diabetes mellitus with hyperglycemia: Secondary | ICD-10-CM | POA: Diagnosis not present

## 2020-05-22 DIAGNOSIS — I509 Heart failure, unspecified: Secondary | ICD-10-CM | POA: Diagnosis not present

## 2020-05-22 DIAGNOSIS — I10 Essential (primary) hypertension: Secondary | ICD-10-CM | POA: Diagnosis not present

## 2020-05-22 DIAGNOSIS — N400 Enlarged prostate without lower urinary tract symptoms: Secondary | ICD-10-CM | POA: Diagnosis not present

## 2020-05-27 ENCOUNTER — Other Ambulatory Visit: Payer: Self-pay | Admitting: Physician Assistant

## 2020-05-27 ENCOUNTER — Other Ambulatory Visit: Payer: Self-pay | Admitting: Cardiology

## 2020-05-28 ENCOUNTER — Other Ambulatory Visit: Payer: Self-pay

## 2020-05-28 ENCOUNTER — Ambulatory Visit (INDEPENDENT_AMBULATORY_CARE_PROVIDER_SITE_OTHER): Payer: Medicare Other

## 2020-05-28 DIAGNOSIS — I6350 Cerebral infarction due to unspecified occlusion or stenosis of unspecified cerebral artery: Secondary | ICD-10-CM | POA: Diagnosis not present

## 2020-05-28 DIAGNOSIS — I482 Chronic atrial fibrillation, unspecified: Secondary | ICD-10-CM

## 2020-05-28 DIAGNOSIS — Z5181 Encounter for therapeutic drug level monitoring: Secondary | ICD-10-CM

## 2020-05-28 LAB — POCT INR: INR: 1.1 — AB (ref 2.0–3.0)

## 2020-05-28 NOTE — Patient Instructions (Signed)
Description   Take 3 tablets today, tomorrow, and on Saturdays, then resume same dosage of Warfarin 2 tablets daily except 1 tablet on Tuesdays. Recheck INR in 1 week. Call our office if you have any concerns [phone].

## 2020-06-01 ENCOUNTER — Other Ambulatory Visit: Payer: Self-pay

## 2020-06-01 ENCOUNTER — Ambulatory Visit (HOSPITAL_COMMUNITY)
Admission: EM | Admit: 2020-06-01 | Discharge: 2020-06-01 | Disposition: A | Discharge status: Home / Self Care | Payer: Medicare Other | Attending: Family Medicine | Admitting: Family Medicine

## 2020-06-01 ENCOUNTER — Encounter (HOSPITAL_COMMUNITY): Payer: Self-pay

## 2020-06-01 ENCOUNTER — Ambulatory Visit (INDEPENDENT_AMBULATORY_CARE_PROVIDER_SITE_OTHER): Payer: Medicare Other

## 2020-06-01 DIAGNOSIS — M79672 Pain in left foot: Secondary | ICD-10-CM | POA: Diagnosis not present

## 2020-06-01 DIAGNOSIS — M7989 Other specified soft tissue disorders: Secondary | ICD-10-CM | POA: Diagnosis not present

## 2020-06-01 DIAGNOSIS — M79602 Pain in left arm: Secondary | ICD-10-CM

## 2020-06-01 DIAGNOSIS — M2012 Hallux valgus (acquired), left foot: Secondary | ICD-10-CM | POA: Diagnosis not present

## 2020-06-01 DIAGNOSIS — M19072 Primary osteoarthritis, left ankle and foot: Secondary | ICD-10-CM | POA: Diagnosis not present

## 2020-06-01 MED ORDER — TRAMADOL HCL 50 MG PO TABS: 50.0000 mg | ORAL_TABLET | Freq: Three times a day (TID) | ORAL | 0 refills | Status: DC | PRN

## 2020-06-01 NOTE — ED Provider Notes (Signed)
[place]    CSN: [id number] Arrival date & time: 06/01/20  1119      History   Chief Complaint Chief Complaint  Patient presents with  . Foot Pain  . Hand Blisters/Bleeding    HPI Vincent [name] is a 84 y.o. male.   HPI  Patient is a pleasant 84 year old gentleman who is here for evaluation of a couple of problems.  First he states that he has pain in both of his feet.  Left greater than right.  His left second third and fourth toes are swollen and discolored.  He does not remember trauma.  He does have nerve impairment in his left leg with neuropathy.  He is also had a stroke in the past. He has been on insulin and diabetic with history of poor control.  He cannot tell me what his sugars are running, or what his most recent A1c ER. He also has vascular disease, hypertension, hyperlipidemia, coronary artery disease with stent. His second complaint is hand pain.  He states his fingertips hurt.  He states that it hurts when he tries to button his shirts or do anything with his fingertips. He denies any problems with his circulation in the past He denies any history of neuropathy in the past I am uncertain whether patient is a poor historian due to memory impairment or whether he is being evasive at times Past Medical History:  Diagnosis Date  . Alcoholic cardiomyopathy ([place])   . Bradycardia   . Chronic systolic CHF (congestive heart failure) ([place])   . 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 COPD (chronic obstructive pulmonary disease) ([place])   . Coronary artery disease    a. s/p remote stent to Cx 2004 with chronic stable angina in context of residual circumflex disease.  . Foot drop, left 03/11/2015  . Gait disorder 03/11/2015  . GERD (gastroesophageal reflux disease)   . Glaucoma   . Gout   . Hemiparesis and alteration of sensations as late effects of stroke ([place]) 09/25/2015  . Hyperlipidemia   . Hypertension   . Long term (current) use of  anticoagulants   . 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 NSVT (nonsustained ventricular tachycardia) ([place])   . Permanent atrial fibrillation ([place])   . Pulmonary eosinophilia ([place])   . Sinus bradycardia   . Stroke ([place])   . Syncope and collapse   . Unspecified glaucoma(365.9)     Patient Active Problem List   Diagnosis Date Noted  . Chest pain 11/25/2015  . 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 Hemiparesis and alteration of sensations as late effects of stroke ([place]) 09/25/2015  . Abnormal stress test 07/21/2015  . Foot drop, left 03/11/2015  . Gait disorder 03/11/2015  . Precordial pain 03/19/2014  . Dizziness 03/17/2014  . Near syncope 03/17/2014  . Encounter for therapeutic drug monitoring 09/24/2013  . Rectal bleeding 10/19/2012  . External hemorrhoid, bleeding 10/19/2012  . Long term current use of anticoagulant 10/12/2010  . GLAUCOMA 09/17/2010  . GERD 09/17/2010  . HEMORRHAGE OF RECTUM AND ANUS 09/17/2010  . BRADYCARDIA 06/09/2010  . VENTRICULAR TACHYCARDIA 12/30/2008  . SYNCOPE 12/30/2008  . PULMONARY INFILTRATE INCLUDES (EOSINOPHILIA) 12/28/2007  . HLD (hyperlipidemia) 12/27/2007  . Essential hypertension 12/27/2007  . Coronary atherosclerosis 12/27/2007  . Alcoholic cardiomyopathy ([place]) 12/27/2007  . PAROXYSMAL ATRIAL FIBRILLATION 12/27/2007  . Cerebral artery occlusion with cerebral infarction ([place]) 12/27/2007  . COUGH 12/27/2007  Past Surgical History:  Procedure Laterality Date  . CARDIAC CATHETERIZATION N/A 07/30/2015   Procedure: Left Heart Cath and Coronary Angiography;  Surgeon: Belva Crome, MD;  Location: [facility];  Service: Cardiovascular;  Laterality: N/A;  . COLONOSCOPY    . KNEE SURGERY    . loop recorder  09/2007       Home Medications    Prior to Admission medications   Medication Sig Start Date End Date Taking? Authorizing Provider  ACCU-CHEK FASTCLIX LANCETS MISC USE TO CHECK BLOOD SUGAR  QD 06/05/18   [provider]  ACCU-CHEK GUIDE test strip USE TO CHECK BLOOD SUGAR QD 06/07/18   [provider]  acetaminophen (TYLENOL) 500 MG tablet Take 1 tablet (500 mg total) by mouth every 8 (eight) hours as needed for mild pain or moderate pain. 09/15/19   Zigmund Gottron, NP  aspirin 81 MG chewable tablet Chew 81 mg by mouth daily.    [provider]  atorvastatin (LIPITOR) 40 MG tablet Take 1 tablet (40 mg total) by mouth daily at 6 PM. Please make yearly appt with Dr. Meda Coffee for October before anymore refills. 1st attempt 05/27/20   [name], MD  brimonidine [facility]) 0.2 % ophthalmic solution Place 1 drop into both eyes 2 (two) times daily. 07/30/16   [provider]  carvedilol (COREG) 6.25 MG tablet Take 1 tablet (6.25 mg total) by mouth 2 (two) times daily with a meal. 09/09/19   [name], MD  colchicine 0.6 MG tablet Take 0.6 mg by mouth as needed.     [provider]  Continuous Blood Gluc Sensor (FREESTYLE LIBRE 14 DAY SENSOR) MISC USE AS DIRECTED EVERY 14 DAYS. 03/05/19   [provider]  famotidine (PEPCID) 20 MG tablet Take 20 mg by mouth 2 (two) times daily.    [provider]  ferrous sulfate 325 (65 FE) MG tablet Take 325 mg by mouth daily with breakfast.    [provider]  furosemide (LASIX) 20 MG tablet TAKE 1 TABLET(20 MG) BY MOUTH DAILY 07/05/19   [name], MD  hydrocortisone cream 1 % Apply 1 application topically 2 (two) times daily as needed (for eczmea).    [provider]  ipratropium (ATROVENT) 0.06 % nasal spray Place 2 sprays into both nostrils 4 (four) times daily. Patient taking differently: Place 2 sprays into both nostrils as needed.  11/21/16   [name], NP  irbesartan (AVAPRO) 300 MG tablet TAKE 1 TABLET BY MOUTH EVERY NIGHT AT BEDTIME 03/23/20   [name], MD  isosorbide mononitrate (IMDUR) 30 MG 24 hr tablet TAKE 1 TABLET(30 MG) BY MOUTH  DAILY 06/26/19   [name], MD  JARDIANCE 25 MG TABS tablet Take 25 mg by mouth daily. 02/04/20   [provider]  latanoprost (XALATAN) 0.005 % ophthalmic solution Place 1 drop into both eyes at bedtime.    [provider]  loratadine (CLARITIN) 10 MG tablet Take 10 mg by mouth daily.    [provider]  magnesium oxide (MAG-OX) 400 MG tablet Take 1 tablet (400 mg total) by mouth 2 (two) times daily. 09/06/19   Dunn, Nedra Hai, PA-C  meclizine (ANTIVERT) 25 MG tablet Take 25 mg by mouth 3 (three) times daily as needed for dizziness.    [provider]  metFORMIN (GLUCOPHAGE) 500 MG tablet Take 500 mg by mouth 2 (two) times daily. 06/16/19   [provider]  MYRBETRIQ 25  MG TB24 tablet Take 25 mg by mouth daily. 02/02/20   [provider]  nitrofurantoin, macrocrystal-monohydrate, (MACROBID) 100 MG capsule Take 100 mg by mouth at bedtime.    [provider]  nitroGLYCERIN (NITROLINGUAL) 0.4 MG/SPRAY spray Place 1 spray under the tongue every 5 (five) minutes x 3 doses as needed for chest pain. 12/11/19   [name], MD  Omega-3 Fatty Acids (FISH OIL) 1000 MG CAPS Take 1,000 mg by mouth 2 (two) times daily.     [provider]  ROCKLATAN 0.02-0.005 % SOLN Apply 1 drop to eye at bedtime. 02/03/20   [provider]  spironolactone (ALDACTONE) 25 MG tablet TAKE 1 TABLET(25 MG) BY MOUTH DAILY 12/05/19   [name], MD  Tamsulosin HCl (FLOMAX) 0.4 MG CAPS Take 0.4 mg by mouth daily. 12/28/11   [provider]  traMADol (ULTRAM) 50 MG tablet Take 1 tablet (50 mg total) by mouth 3 (three) times daily as needed. 06/01/20   Volney American, PA-C  warfarin (COUMADIN) 2 MG tablet TAKE AS DIRECTED BY COUMADIN CLINIC. 12/06/19   [name], MD    Family History Family History  Problem Relation Age of Onset  . Colon cancer Mother   . Hypertension Mother   . Cancer Mother   . Prostate cancer  Brother   . Cancer Brother   . Heart disease Father   . Heart attack Father   . Hypertension Father   . Heart attack Sister   . Cancer Sister   . Stroke Neg Hx   . Colon polyps Neg Hx   . Esophageal cancer Neg Hx   . Stomach cancer Neg Hx   . Rectal cancer Neg Hx   . Pancreatic cancer Neg Hx     Social History Social History   Tobacco Use  . Smoking status: Former Smoker    Quit date: 08/22/1983    Years since quitting: 36.8  . Smokeless tobacco: Never Used  Vaping Use  . Vaping Use: Never used  Substance Use Topics  . Alcohol use: No  . Drug use: No     Allergies   Patient has no known allergies.   Review of Systems Review of Systems See HPI  Physical Exam Triage Vital Signs ED Triage Vitals  Enc Vitals Group     BP 06/01/20 1327 120/72     Pulse Rate 06/01/20 1327 91     Resp 06/01/20 1327 17     Temp 06/01/20 1327 98 F (36.7 C)     Temp Source 06/01/20 1327 Oral     SpO2 06/01/20 1327 100 %     Weight --      Height --      Head Circumference --      Peak Flow --      Pain Score 06/01/20 1328 2     Pain Loc --      Pain Edu? --      Excl. in [place]? --    No data found.  Updated Vital Signs BP 120/72 (BP Location: Right Arm)   Pulse 91   Temp 98 F (36.7 C) (Oral)   Resp 17   SpO2 100%      Physical Exam Constitutional:      General: He is not in acute distress.    Appearance: He is well-developed.     Comments: Lean.  Slow to answer questions  HENT:     Head: Normocephalic and atraumatic.  Eyes:     Conjunctiva/sclera: Conjunctivae normal.     Pupils: Pupils are equal, round, and reactive to light.  Cardiovascular:     Rate and Rhythm: Normal rate and regular rhythm.     Heart sounds: Normal heart sounds.  Pulmonary:     Effort: Pulmonary effort is normal. No respiratory distress.     Breath sounds: Normal breath sounds.  Abdominal:     Palpations: Abdomen is soft.  Musculoskeletal:        General: Normal range of motion.      Cervical back: Normal range of motion.     Right lower leg: Edema present.     Left lower leg: Edema present.     Comments: Both feet with swelling, 2+ edema.  Mult excoriations of shins, superficial.  Unable to palpate pulses.  Cap refill about 4 sec.  Toes 2-3-4 on the left foot are discolored/dusky/ tender.  Not cool.  Pain with ROM  Skin:    General: Skin is warm and dry.     Coloration: Skin is pale.     Comments: Hands are both pale and cool with long cap refill , over 8 sec.  Blister present on the right fourth fingertip, healing.  No ulcerations.    Neurological:     Mental Status: He is alert.     Sensory: Sensory deficit present.  Psychiatric:        Mood and Affect: Mood normal.        Behavior: Behavior normal.     Comments: Slow responses.  Some evasive answers      UC Treatments / Results  Labs (all labs ordered are listed, but only abnormal results are displayed) Labs Reviewed - No data to display  EKG   Radiology DG Foot Complete Left  Result Date: 06/01/2020 CLINICAL DATA:  Foot swelling.  No injury. EXAM: LEFT FOOT - COMPLETE 3+ VIEW COMPARISON:  None. FINDINGS: No acute fracture or dislocation. Mild hallux valgus deformity with metatarsal-sesamoidal degenerative changes. Remaining joint spaces are preserved. Os navicular. Curvilinear dystrophic calcification along the medial aspect of the first metatarsal head. Tiny plantar enthesophyte. Vascular calcifications. IMPRESSION: 1. No acute osseous abnormality. 2. Mild hallux valgus deformity with metatarsal-sesamoidal osteoarthritis. Electronically Signed   By: Titus Dubin M.D.   On: 06/01/2020 15:00    Procedures Procedures (including critical care time)  Medications Ordered in UC Medications - No data to display  Initial Impression / Assessment and Plan / UC Course  I have reviewed the triage vital signs and the nursing notes.  Pertinent labs & imaging results that were available during my care of the  patient were reviewed by me and considered in my medical decision making (see chart for details).     My biggest concern is the impaired circulation to the UE.  On re exam the left index finger remained white, but both hands were cool but slightly pinker.  He needs vascular exam.  states it takes some time to get an appt with his PCP so will place referral. Final Clinical Impressions(s) / UC Diagnoses   Final diagnoses:  Pain of left upper extremity  Left foot pain     Discharge Instructions     I have referred you to vascular surgery, they are specialists in circulation Let us know if you have not heard from then within 3 days Take the tramadol as needed for pain not relieved by tylenol May take gabapentin also for nerve pain if needed Follow  up with your regular doctors     ED Prescriptions    Medication Sig Dispense Auth. Provider   traMADol (ULTRAM) 50 MG tablet Take 1 tablet (50 mg total) by mouth 3 (three) times daily as needed. 15 tablet Volney American, [place]     I have reviewed the PDMP during this encounter.   Raylene Everts, MD 06/01/20 1540

## 2020-06-01 NOTE — Discharge Instructions (Signed)
I have referred you to vascular surgery, they are specialists in circulation Let us know if you have not heard from then within 3 days Take the tramadol as needed for pain not relieved by tylenol May take gabapentin also for nerve pain if needed Follow up with your regular doctors

## 2020-06-01 NOTE — ED Triage Notes (Signed)
Pt presents with bilateral foot pain for over a week; Pt also had bilateral hand blisters & bleeding from unknown source.  Pt is diabetic

## 2020-06-02 ENCOUNTER — Other Ambulatory Visit: Payer: Self-pay | Admitting: Cardiology

## 2020-06-02 DIAGNOSIS — I482 Chronic atrial fibrillation, unspecified: Secondary | ICD-10-CM

## 2020-06-02 DIAGNOSIS — Z5181 Encounter for therapeutic drug level monitoring: Secondary | ICD-10-CM

## 2020-06-02 DIAGNOSIS — E785 Hyperlipidemia, unspecified: Secondary | ICD-10-CM

## 2020-06-02 DIAGNOSIS — I10 Essential (primary) hypertension: Secondary | ICD-10-CM

## 2020-06-03 ENCOUNTER — Other Ambulatory Visit: Payer: Self-pay | Admitting: Cardiology

## 2020-06-03 DIAGNOSIS — I482 Chronic atrial fibrillation, unspecified: Secondary | ICD-10-CM

## 2020-06-03 DIAGNOSIS — Z5181 Encounter for therapeutic drug level monitoring: Secondary | ICD-10-CM

## 2020-06-03 DIAGNOSIS — E785 Hyperlipidemia, unspecified: Secondary | ICD-10-CM

## 2020-06-03 DIAGNOSIS — I10 Essential (primary) hypertension: Secondary | ICD-10-CM

## 2020-06-04 ENCOUNTER — Other Ambulatory Visit: Payer: Self-pay | Admitting: Cardiology

## 2020-06-04 ENCOUNTER — Other Ambulatory Visit: Payer: Self-pay

## 2020-06-04 ENCOUNTER — Ambulatory Visit (INDEPENDENT_AMBULATORY_CARE_PROVIDER_SITE_OTHER): Payer: Medicare Other | Admitting: *Deleted

## 2020-06-04 DIAGNOSIS — I482 Chronic atrial fibrillation, unspecified: Secondary | ICD-10-CM

## 2020-06-04 DIAGNOSIS — I4891 Unspecified atrial fibrillation: Secondary | ICD-10-CM | POA: Diagnosis not present

## 2020-06-04 DIAGNOSIS — Z5181 Encounter for therapeutic drug level monitoring: Secondary | ICD-10-CM

## 2020-06-04 DIAGNOSIS — I6350 Cerebral infarction due to unspecified occlusion or stenosis of unspecified cerebral artery: Secondary | ICD-10-CM | POA: Diagnosis not present

## 2020-06-04 DIAGNOSIS — E785 Hyperlipidemia, unspecified: Secondary | ICD-10-CM

## 2020-06-04 DIAGNOSIS — I10 Essential (primary) hypertension: Secondary | ICD-10-CM

## 2020-06-04 LAB — POCT INR: INR: 1.6 — AB (ref 2.0–3.0)

## 2020-06-04 NOTE — Patient Instructions (Signed)
Description   Take 3 tablets today and tomorrow then start taking Warfarin 2 tablets daily. Recheck INR in 2 weeks. Call our office if you have any concerns [phone].

## 2020-06-05 ENCOUNTER — Ambulatory Visit: Payer: Medicare Other | Admitting: Podiatry

## 2020-06-05 ENCOUNTER — Encounter: Payer: Self-pay | Admitting: Podiatry

## 2020-06-05 DIAGNOSIS — B351 Tinea unguium: Secondary | ICD-10-CM | POA: Diagnosis not present

## 2020-06-05 DIAGNOSIS — D689 Coagulation defect, unspecified: Secondary | ICD-10-CM | POA: Diagnosis not present

## 2020-06-05 DIAGNOSIS — M79674 Pain in right toe(s): Secondary | ICD-10-CM

## 2020-06-05 DIAGNOSIS — E1142 Type 2 diabetes mellitus with diabetic polyneuropathy: Secondary | ICD-10-CM

## 2020-06-05 DIAGNOSIS — M79675 Pain in left toe(s): Secondary | ICD-10-CM

## 2020-06-06 NOTE — Progress Notes (Signed)
Subjective:   Patient ID: Vincent [name], male   DOB: 84 y.o.   MRN: [id number]   HPI Patient presents with nail disease they have become increasingly thickened 1-5 both feet that are bothersome.  Patient states that he needs them taken care of and he does have blood thinner that he takes no   ROS      Objective:  Physical Exam  Neurovascular status intact with patient found to have thick yellow brittle nailbeds 1-5 both feet that are dystrophic     Assessment:  Mycotic nail infection with pain 1-5 both feet     Plan:  Debridement nailbeds 1-5 both feet with no iatrogenic bleeding noted

## 2020-06-19 ENCOUNTER — Other Ambulatory Visit: Payer: Self-pay

## 2020-06-19 ENCOUNTER — Ambulatory Visit: Payer: Medicare Other | Admitting: Pharmacist

## 2020-06-19 ENCOUNTER — Encounter: Payer: Self-pay | Admitting: General Practice

## 2020-06-19 DIAGNOSIS — I482 Chronic atrial fibrillation, unspecified: Secondary | ICD-10-CM

## 2020-06-19 DIAGNOSIS — I6350 Cerebral infarction due to unspecified occlusion or stenosis of unspecified cerebral artery: Secondary | ICD-10-CM

## 2020-06-19 DIAGNOSIS — Z5181 Encounter for therapeutic drug level monitoring: Secondary | ICD-10-CM

## 2020-06-19 LAB — POCT INR: INR: 5.1 — AB (ref 2.0–3.0)

## 2020-06-19 NOTE — Patient Instructions (Signed)
Description   Hold warfarin today, Saturday and Sunday.  Then go back to previous dose of 2 tablets every day except for 1 tablet on Tuesdays. Recheck INR in 2 weeks. Call our office if you have any concerns [phone].

## 2020-06-25 ENCOUNTER — Ambulatory Visit: Payer: Medicare Other | Attending: Internal Medicine

## 2020-06-25 DIAGNOSIS — Z23 Encounter for immunization: Secondary | ICD-10-CM

## 2020-06-25 NOTE — Progress Notes (Signed)
   Covid-19 Vaccination Clinic  Name:  Vincent [name]    MRN: [id number] DOB: July 20, 1936  06/25/2020  Vincent [name] was observed post Covid-19 immunization for 15 minutes without incident. He was provided with Vaccine Information Sheet and instruction to access the V-Safe system.   Vincent [name] was instructed to call 911 with any severe reactions post vaccine: Marland Kitchen Difficulty breathing  . Swelling of face and throat  . A fast heartbeat  . A bad rash all over body  . Dizziness and weakness

## 2020-06-29 ENCOUNTER — Other Ambulatory Visit: Payer: Self-pay | Admitting: Cardiology

## 2020-07-08 ENCOUNTER — Telehealth: Payer: Self-pay | Admitting: *Deleted

## 2020-07-08 NOTE — Telephone Encounter (Signed)
Called pt since he is overdue for Anticoagulation Appt; the voicemail is full so unable to leave a message. Will try back.

## 2020-07-12 ENCOUNTER — Other Ambulatory Visit: Payer: Self-pay | Admitting: Cardiology

## 2020-07-12 DIAGNOSIS — I482 Chronic atrial fibrillation, unspecified: Secondary | ICD-10-CM

## 2020-07-12 DIAGNOSIS — E785 Hyperlipidemia, unspecified: Secondary | ICD-10-CM

## 2020-07-12 DIAGNOSIS — I10 Essential (primary) hypertension: Secondary | ICD-10-CM

## 2020-07-12 DIAGNOSIS — Z5181 Encounter for therapeutic drug level monitoring: Secondary | ICD-10-CM

## 2020-07-13 ENCOUNTER — Other Ambulatory Visit: Payer: Self-pay | Admitting: Cardiology

## 2020-07-13 DIAGNOSIS — I482 Chronic atrial fibrillation, unspecified: Secondary | ICD-10-CM

## 2020-07-13 DIAGNOSIS — E785 Hyperlipidemia, unspecified: Secondary | ICD-10-CM

## 2020-07-13 DIAGNOSIS — I10 Essential (primary) hypertension: Secondary | ICD-10-CM

## 2020-07-13 DIAGNOSIS — Z5181 Encounter for therapeutic drug level monitoring: Secondary | ICD-10-CM

## 2020-07-14 ENCOUNTER — Other Ambulatory Visit: Payer: Self-pay

## 2020-07-14 ENCOUNTER — Ambulatory Visit: Payer: Medicare Other | Admitting: *Deleted

## 2020-07-14 DIAGNOSIS — I6350 Cerebral infarction due to unspecified occlusion or stenosis of unspecified cerebral artery: Secondary | ICD-10-CM | POA: Diagnosis not present

## 2020-07-14 DIAGNOSIS — Z5181 Encounter for therapeutic drug level monitoring: Secondary | ICD-10-CM

## 2020-07-14 DIAGNOSIS — I482 Chronic atrial fibrillation, unspecified: Secondary | ICD-10-CM

## 2020-07-14 LAB — POCT INR: INR: 3.2 — AB (ref 2.0–3.0)

## 2020-07-14 NOTE — Patient Instructions (Signed)
Description   Tomorrow take 1 tablet the start taking 2 tablets every day except for 1 tablet on Tuesdays and Saturdays. Recheck INR in 3 weeks. Call our office if you have any concerns [phone].

## 2020-07-21 DIAGNOSIS — E1165 Type 2 diabetes mellitus with hyperglycemia: Secondary | ICD-10-CM | POA: Diagnosis not present

## 2020-07-21 DIAGNOSIS — I509 Heart failure, unspecified: Secondary | ICD-10-CM | POA: Diagnosis not present

## 2020-07-21 DIAGNOSIS — I10 Essential (primary) hypertension: Secondary | ICD-10-CM | POA: Diagnosis not present

## 2020-07-21 DIAGNOSIS — N400 Enlarged prostate without lower urinary tract symptoms: Secondary | ICD-10-CM | POA: Diagnosis not present

## 2020-08-04 ENCOUNTER — Other Ambulatory Visit: Payer: Self-pay

## 2020-08-04 DIAGNOSIS — I739 Peripheral vascular disease, unspecified: Secondary | ICD-10-CM

## 2020-08-04 DIAGNOSIS — M79642 Pain in left hand: Secondary | ICD-10-CM

## 2020-08-04 NOTE — Progress Notes (Signed)
Cardiology Office Note    Date:  08/05/2020   ID:  Vincent [name], DOB May 16, 1936, MRN [id number]  PCP:  [name], MD  Cardiologist: Ena Dawley, MD EPS: Vincent Axe, MD  Chief Complaint  Patient presents with  . Follow-up    History of Present Illness:  Vincent [name] is a 84 y.o. male with history of CAD s/p remote stent to Cx 2004 with chronic stable angina in context of residual circumflex disease, chronic systolic CHF felt due to alcoholic cardiomyopathy, former ETOH use, permanent atrial fibrillation on Coumadin, DM, COPD, left foot drop, glaucoma, GERD, prior stroke, pulmonary eosinophilia, sinus bradycardia, remote NSVT   Patient was last seen in our office 05/2019 which time he was doing well.  Not interested in switching to a DOAC.  Patient comes in for yearly f/u. Is very sedentary, likes to read books. Walks in [place] and when out shopping. Very seldom has a chest tightness when coming up inclined driveway. Goes away with rest. Occasionally uses NTG spray just as a "precaution". Says it hasn't gotten worse since he was last here. No shortness of breath, palpitations. Occasional ankle edema. Skips lasix if he's going out because of leakage. No bleeding problems on coumadin.   Past Medical History:  Diagnosis Date  . Alcoholic cardiomyopathy ([place])   . Bradycardia   . Chronic systolic CHF (congestive heart failure) ([place])   . 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 COPD (chronic obstructive pulmonary disease) ([place])   . Coronary artery disease    a. s/p remote stent to Cx 2004 with chronic stable angina in context of residual circumflex disease.  . Foot drop, left 03/11/2015  . Gait disorder 03/11/2015  . GERD (gastroesophageal reflux disease)   . Glaucoma   . Gout   . Hemiparesis and alteration of sensations as late effects of stroke ([place]) 09/25/2015  . Hyperlipidemia   . Hypertension   . Long term (current) use of anticoagulants   .  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 NSVT (nonsustained ventricular tachycardia) ([place])   . Permanent atrial fibrillation ([place])   . Pulmonary eosinophilia ([place])   . Sinus bradycardia   . Stroke ([place])   . Syncope and collapse   . Unspecified glaucoma(365.9)     Past Surgical History:  Procedure Laterality Date  . CARDIAC CATHETERIZATION N/A 07/30/2015   Procedure: Left Heart Cath and Coronary Angiography;  Surgeon: Belva Crome, MD;  Location: [facility];  Service: Cardiovascular;  Laterality: N/A;  . COLONOSCOPY    . KNEE SURGERY    . loop recorder  09/2007    Current Medications: Current Meds  Medication Sig  . ACCU-CHEK FASTCLIX LANCETS MISC USE TO CHECK BLOOD SUGAR QD  . ACCU-CHEK GUIDE test strip USE TO CHECK BLOOD SUGAR QD  . acetaminophen (TYLENOL) 500 MG tablet Take 1 tablet (500 mg total) by mouth every 8 (eight) hours as needed for mild pain or moderate pain.  Marland Kitchen aspirin 81 MG chewable tablet Chew 81 mg by mouth daily.  Marland Kitchen atorvastatin (LIPITOR) 40 MG tablet Take 1 tablet (40 mg total) by mouth daily at 6 PM. Please make yearly appt with Dr. Meda Coffee for October before anymore refills. 1st attempt  . brimonidine (ALPHAGAN) 0.2 % ophthalmic solution Place 1 drop into both eyes 2 (two) times daily.  . calcium-vitamin D (OSCAL WITH D) 250-125 MG-UNIT tablet Take 1 tablet by mouth daily.  . carvedilol (COREG)  6.25 MG tablet TAKE 1 TABLET(6.25 MG) BY MOUTH TWICE DAILY WITH A MEAL  . colchicine 0.6 MG tablet Take 0.6 mg by mouth as needed.   . Continuous Blood Gluc Sensor (FREESTYLE LIBRE 14 DAY SENSOR) MISC USE AS DIRECTED EVERY 14 DAYS.  . famotidine (PEPCID) 20 MG tablet Take 20 mg by mouth 2 (two) times daily.  . ferrous sulfate 325 (65 FE) MG tablet Take 325 mg by mouth daily with breakfast.  . furosemide (LASIX) 20 MG tablet TAKE 1 TABLET(20 MG) BY MOUTH DAILY  . hydrocortisone cream 1 % Apply 1 application topically 2 (two) times daily as needed (for  eczmea).  Marland Kitchen ipratropium (ATROVENT) 0.06 % nasal spray Place 2 sprays into both nostrils 4 (four) times daily. (Patient taking differently: Place 2 sprays into both nostrils as needed. )  . irbesartan (AVAPRO) 300 MG tablet TAKE 1 TABLET BY MOUTH EVERY NIGHT AT BEDTIME  . isosorbide mononitrate (IMDUR) 30 MG 24 hr tablet Take 1 tablet (30 mg total) by mouth daily. Please keep upcoming appt in December before anymore refills. Thank you  . JARDIANCE 25 MG TABS tablet Take 25 mg by mouth daily.  Marland Kitchen latanoprost (XALATAN) 0.005 % ophthalmic solution Place 1 drop into both eyes at bedtime.  Marland Kitchen loratadine (CLARITIN) 10 MG tablet Take 10 mg by mouth daily.  . magnesium oxide (MAG-OX) 400 MG tablet Take 1 tablet (400 mg total) by mouth 2 (two) times daily.  . meclizine (ANTIVERT) 25 MG tablet Take 25 mg by mouth 3 (three) times daily as needed for dizziness.  . metFORMIN (GLUCOPHAGE) 500 MG tablet Take 500 mg by mouth 2 (two) times daily.  . Multiple Vitamins-Minerals (MULTIVITAMIN WITH MINERALS) tablet Take 1 tablet by mouth in the morning and at bedtime.  Marland Kitchen MYRBETRIQ 25 MG TB24 tablet Take 25 mg by mouth daily.  . nitrofurantoin, macrocrystal-monohydrate, (MACROBID) 100 MG capsule Take 100 mg by mouth at bedtime.  . nitroGLYCERIN (NITROLINGUAL) 0.4 MG/SPRAY spray Place 1 spray under the tongue every 5 (five) minutes x 3 doses as needed for chest pain.  . Omega-3 Fatty Acids (FISH OIL) 1000 MG CAPS Take 1,000 mg by mouth 2 (two) times daily.   Marland Kitchen ROCKLATAN 0.02-0.005 % SOLN Apply 1 drop to eye at bedtime.  Marland Kitchen spironolactone (ALDACTONE) 25 MG tablet TAKE 1 TABLET(25 MG) BY MOUTH DAILY. NEED APPOINTMENT FOR FUTURE FILLS  . Tamsulosin HCl (FLOMAX) 0.4 MG CAPS Take 0.4 mg by mouth daily.  . traMADol (ULTRAM) 50 MG tablet Take 1 tablet (50 mg total) by mouth 3 (three) times daily as needed.  . warfarin (COUMADIN) 2 MG tablet TAKE AS DIRECTED BY COUMADIN CLINIC     Allergies:   Patient has no known allergies.    Social History   Socioeconomic History  . Marital status: Divorced    Spouse name: Not on file  . Number of children: 0  . Years of education: master's  . Highest education level: Not on file  Occupational History  . Occupation: semi-retired  Tobacco Use  . Smoking status: Former Smoker    Quit date: 08/22/1983    Years since quitting: 36.9  . Smokeless tobacco: Never Used  Vaping Use  . Vaping Use: Never used  Substance and Sexual Activity  . Alcohol use: No  . Drug use: No  . Sexual activity: Not on file  Other Topics Concern  . Not on file  Social History Narrative   Patient drinks 1-2 cups of caffeine daily.  Patient is right handed.   Social Determinants of Health   Financial Resource Strain:   . Difficulty of Paying Living Expenses: Not on file  Food Insecurity:   . Worried About Charity fundraiser in the Last Year: Not on file  . Ran Out of Food in the Last Year: Not on file  Transportation Needs:   . Lack of Transportation (Medical): Not on file  . Lack of Transportation (Non-Medical): Not on file  Physical Activity:   . Days of Exercise per Week: Not on file  . Minutes of Exercise per Session: Not on file  Stress:   . Feeling of Stress : Not on file  Social Connections:   . Frequency of Communication with Friends and Family: Not on file  . Frequency of Social Gatherings with Friends and Family: Not on file  . Attends Religious Services: Not on file  . Active Member of Clubs or Organizations: Not on file  . Attends Archivist Meetings: Not on file  . Marital Status: Not on file     Family History:  The patient's family history includes Cancer in his brother, mother, and sister; Colon cancer in his mother; Heart attack in his father and sister; Heart disease in his father; Hypertension in his father and mother; Prostate cancer in his brother.   ROS:   Please see the history of present illness.    ROS All other systems reviewed and are  negative.   PHYSICAL EXAM:   VS:  BP 140/70   Pulse 65   Ht 5\' 9"  (1.753 m)   Wt 179 lb (81.2 kg)   SpO2 98%   BMI 26.43 kg/m   Physical Exam  GEN: Well nourished, well developed, in no acute distress  Neck: no JVD, carotid bruits, or masses Cardiac: irreg irreg 2/6 systolic murmur LSB Respiratory:  clear to auscultation bilaterally, normal work of breathing GI: soft, nontender, nondistended, + BS Ext: trace right ankle edema,without cyanosis, clubbing Good distal pulses bilaterally Neuro:  Alert and Oriented x 3 Psych: euthymic mood, full affect  Wt Readings from Last 3 Encounters:  08/05/20 179 lb (81.2 kg)  09/15/19 192 lb (87.1 kg)  06/25/19 191 lb 6.4 oz (86.8 kg)      Studies/Labs Reviewed:   EKG:  EKG is Atrial fib with TWI inf, antlateral unchanged.        Component Value Date/Time   CHOL 156 11/28/2017 1201   TRIG 160 (H) 11/28/2017 1201   HDL 52 11/28/2017 1201   CHOLHDL 3.0 11/28/2017 1201   CHOLHDL 2.6 04/27/2016 1208   VLDL 25 04/27/2016 1208   LDLCALC 72 11/28/2017 1201    Additional studies/ records that were reviewed today include:  Cardiac cath 07/30/2015 1. Mid RCA-1 lesion, 20% stenosed. The lesion was previously treated with a stent (bare metal). 2. Mid RCA-2 lesion, 60% stenosed. 3. Prox RCA lesion, 60% stenosed. 4. Mid Cx to Dist Cx lesion, 75% stenosed. 5. Ost LAD to [facility] LAD lesion, 25% stenosed.    The right coronary artery contains a patent stent in the midsegment. Proximal and distal to the stent or eccentric regions of 50-60% narrowing. These areas are unchanged compared to 2006.  Codominant left coronary system with luminal irregularities in LAD and distal circumflex 70-75% stenosis before the origin of a small branching obtuse marginal. The distal circumflex disease has progressed since 2006. The territory supplied beyond the stenosis is small  Dilated and globally hypocontractile left ventricle with ejection  fraction 35%. Left  ventricular end-diastolic pressure is normal.       RECOMMENDATIONS:    The right coronary has not changed since the last angiogram. There is moderate progression in the distal circumflex. The circumflex could be causing angina. This territory is most appropriately treated with medical therapy given the need for triple drug therapy if we stent. The risk of bleeding would be greater than the benefit achieved by stenting in this relatively limited territory.      Risk Assessment/Calculations:     CHA2DS2-VASc Score = 6  This indicates a 9.7% annual risk of stroke. The patient's score is based upon: CHF History: 1 HTN History: 1 Diabetes History: 1 Stroke History: 0 Vascular Disease History: 1 Age Score: 2 Gender Score: 0       ASSESSMENT:    1. Coronary artery disease involving native coronary artery of native heart without angina pectoris   2. NICM (nonischemic cardiomyopathy) ([place])   3. Permanent atrial fibrillation (HCC)      PLAN:  In order of problems listed above:  CAD BMS to the RCA last cath in 2016 patent but proximal and distal to the stent eccentric narrowing unchanged 70 to 75% distal circumflex stable exertional angina is unchanged and usually relieved with rest.  Will notify us if it becomes more frequent.  Continue Imdur, aspirin, Lipitor, Coreg   nonischemic cardiomyopathy felt to be alcoholic last echo 3151 LVEF 40 to 45%. Drinks 1/2 beer every 2-3 months.  Continue Coreg, Avapro and spironolactone  Permanent atrial fibrillation on Coumadin no bleeding problems.  Labs checked in September were all stable.  DM A1C 7.9 in Sept on metformin and jardiance. Managed by PCP  Hyperlipidemia LDL 64 04/2020 on Lipitor  Weight loss 13 lbs this year. He thinks he's eating the same amount       Medication Adjustments/Labs and Tests Ordered: Current medicines are reviewed at length with the patient today.  Concerns regarding medicines are outlined above.   Medication changes, Labs and Tests ordered today are listed in the Patient Instructions below. Patient Instructions  Medication Instructions:  Your physician recommends that you continue on your current medications as directed. Please refer to the Current Medication list given to you today.  *If you need a refill on your cardiac medications before your next appointment, please call your pharmacy*   Lab Work: None If you have labs (blood work) drawn today and your tests are completely normal, you will receive your results only by: Marland Kitchen MyChart Message (if you have MyChart) OR . A paper copy in the mail If you have any lab test that is abnormal or we need to change your treatment, we will call you to review the results.   Follow-Up: At [facility], you and your health needs are our priority.  As part of our continuing mission to provide you with exceptional heart care, we have created designated Provider Care Teams.  These Care Teams include your primary Cardiologist (physician) and Advanced Practice Providers (APPs -  Physician Assistants and Nurse Practitioners) who all work together to provide you with the care you need, when you need it.  We recommend signing up for the patient portal called "MyChart".  Sign up information is provided on this After Visit Summary.  MyChart is used to connect with patients for Virtual Visits (Telemedicine).  Patients are able to view lab/test results, encounter notes, upcoming appointments, etc.  Non-urgent messages can be sent to your provider as well.  To learn more about what you can do with MyChart, go to [email or web address].    Your next appointment:   1 year(s)  The format for your next appointment:   In Person  Provider:   You may see Ena Dawley, MD or one of the following Advanced Practice Providers on your designated Care Team:    Melina Copa, PA-C  Ermalinda Barrios, PA-C    Other Instructions Follow up with your PCP regarding  your weight loss     Signed, Ermalinda Barrios, PA-C  08/05/2020 1:50 PM    [facility] [address], [place], [place]  [address] Phone: [phone]; Fax: [phone]

## 2020-08-05 ENCOUNTER — Ambulatory Visit: Payer: Medicare Other | Admitting: Physician Assistant

## 2020-08-05 ENCOUNTER — Ambulatory Visit (INDEPENDENT_AMBULATORY_CARE_PROVIDER_SITE_OTHER): Payer: Medicare Other | Admitting: *Deleted

## 2020-08-05 ENCOUNTER — Encounter (INDEPENDENT_AMBULATORY_CARE_PROVIDER_SITE_OTHER): Payer: Self-pay

## 2020-08-05 ENCOUNTER — Encounter: Payer: Self-pay | Admitting: Physician Assistant

## 2020-08-05 ENCOUNTER — Other Ambulatory Visit: Payer: Self-pay

## 2020-08-05 VITALS — BP 140/70 | HR 65 | Ht 69.0 in | Wt 179.0 lb

## 2020-08-05 DIAGNOSIS — I482 Chronic atrial fibrillation, unspecified: Secondary | ICD-10-CM

## 2020-08-05 DIAGNOSIS — Z5181 Encounter for therapeutic drug level monitoring: Secondary | ICD-10-CM

## 2020-08-05 DIAGNOSIS — I428 Other cardiomyopathies: Secondary | ICD-10-CM

## 2020-08-05 DIAGNOSIS — I6350 Cerebral infarction due to unspecified occlusion or stenosis of unspecified cerebral artery: Secondary | ICD-10-CM

## 2020-08-05 DIAGNOSIS — I2510 Atherosclerotic heart disease of native coronary artery without angina pectoris: Secondary | ICD-10-CM | POA: Diagnosis not present

## 2020-08-05 DIAGNOSIS — I4821 Permanent atrial fibrillation: Secondary | ICD-10-CM | POA: Diagnosis not present

## 2020-08-05 LAB — POCT INR: INR: 2.4 (ref 2.0–3.0)

## 2020-08-05 NOTE — Patient Instructions (Signed)
Medication Instructions:  Your physician recommends that you continue on your current medications as directed. Please refer to the Current Medication list given to you today.  *If you need a refill on your cardiac medications before your next appointment, please call your pharmacy*   Lab Work: None If you have labs (blood work) drawn today and your tests are completely normal, you will receive your results only by: Marland Kitchen MyChart Message (if you have MyChart) OR . A paper copy in the mail If you have any lab test that is abnormal or we need to change your treatment, we will call you to review the results.   Follow-Up: At [facility], you and your health needs are our priority.  As part of our continuing mission to provide you with exceptional heart care, we have created designated Provider Care Teams.  These Care Teams include your primary Cardiologist (physician) and Advanced Practice Providers (APPs -  Physician Assistants and Nurse Practitioners) who all work together to provide you with the care you need, when you need it.  We recommend signing up for the patient portal called "MyChart".  Sign up information is provided on this After Visit Summary.  MyChart is used to connect with patients for Virtual Visits (Telemedicine).  Patients are able to view lab/test results, encounter notes, upcoming appointments, etc.  Non-urgent messages can be sent to your provider as well.   To learn more about what you can do with MyChart, go to [email or web address].    Your next appointment:   1 year(s)  The format for your next appointment:   In Person  Provider:   You may see Ena Dawley, MD or one of the following Advanced Practice Providers on your designated Care Team:    Melina Copa, PA-C  Ermalinda Barrios, PA-C    Other Instructions Follow up with your PCP regarding your weight loss

## 2020-08-05 NOTE — Patient Instructions (Signed)
Description   Continue taking the dose you have been taking taking 2 tablets every day except for 1 tablet on Tuesdays. Recheck INR in 3 weeks. Call our office if you have any concerns [phone].

## 2020-08-06 ENCOUNTER — Other Ambulatory Visit: Payer: Self-pay | Admitting: Cardiology

## 2020-08-06 DIAGNOSIS — I4821 Permanent atrial fibrillation: Secondary | ICD-10-CM

## 2020-08-06 DIAGNOSIS — Z01812 Encounter for preprocedural laboratory examination: Secondary | ICD-10-CM

## 2020-08-06 DIAGNOSIS — R9439 Abnormal result of other cardiovascular function study: Secondary | ICD-10-CM

## 2020-08-06 DIAGNOSIS — I10 Essential (primary) hypertension: Secondary | ICD-10-CM

## 2020-08-06 DIAGNOSIS — E7849 Other hyperlipidemia: Secondary | ICD-10-CM

## 2020-08-06 DIAGNOSIS — I5023 Acute on chronic systolic (congestive) heart failure: Secondary | ICD-10-CM

## 2020-08-06 DIAGNOSIS — I2583 Coronary atherosclerosis due to lipid rich plaque: Secondary | ICD-10-CM

## 2020-08-06 DIAGNOSIS — I2510 Atherosclerotic heart disease of native coronary artery without angina pectoris: Secondary | ICD-10-CM

## 2020-08-11 ENCOUNTER — Other Ambulatory Visit: Payer: Self-pay

## 2020-08-11 ENCOUNTER — Ambulatory Visit: Payer: Medicare Other | Admitting: Vascular Surgery

## 2020-08-11 ENCOUNTER — Ambulatory Visit (HOSPITAL_COMMUNITY)
Admission: RE | Admit: 2020-08-11 | Discharge: 2020-08-11 | Disposition: A | Discharge status: Home / Self Care | Payer: Medicare Other | Source: Ambulatory Visit | Attending: Vascular Surgery | Admitting: Vascular Surgery

## 2020-08-11 ENCOUNTER — Encounter: Payer: Self-pay | Admitting: Vascular Surgery

## 2020-08-11 VITALS — BP 141/65 | HR 80 | Temp 97.9°F | Resp 18 | Ht 69.0 in | Wt 179.9 lb

## 2020-08-11 DIAGNOSIS — I509 Heart failure, unspecified: Secondary | ICD-10-CM | POA: Diagnosis not present

## 2020-08-11 DIAGNOSIS — M79642 Pain in left hand: Secondary | ICD-10-CM

## 2020-08-11 DIAGNOSIS — N400 Enlarged prostate without lower urinary tract symptoms: Secondary | ICD-10-CM | POA: Diagnosis not present

## 2020-08-11 DIAGNOSIS — I739 Peripheral vascular disease, unspecified: Secondary | ICD-10-CM

## 2020-08-11 DIAGNOSIS — I10 Essential (primary) hypertension: Secondary | ICD-10-CM | POA: Diagnosis not present

## 2020-08-11 DIAGNOSIS — E1165 Type 2 diabetes mellitus with hyperglycemia: Secondary | ICD-10-CM | POA: Diagnosis not present

## 2020-08-11 NOTE — Progress Notes (Signed)
ASSESSMENT & PLAN:  84 y.o. male with resolved upper and lower extremity digital pain. Suspect raynaud's phenomenon. No evidence of symptomatic or significant peripheral arterial disease. Encouraged patient to keep hands and feet warm. Follow up PRN.  CHIEF COMPLAINT:   Finger / toe pain.  HISTORY:  HISTORY OF PRESENT ILLNESS: Vincent [name] is a 84 y.o. male referred to clinic for evaluation of bilateral foot pain and finger pain.  He was seen at an urgent care facility for the same 06/01/2020.  Today in my evaluation, the patient reports no pain whatsoever.  He denies history of intermittent claudication.  He denies history of ischemic rest pain.  He denies history of ischemic ulceration.  Past Medical History:  Diagnosis Date  . Alcoholic cardiomyopathy ([place])   . Bradycardia   . Chronic systolic CHF (congestive heart failure) ([place])   . 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 COPD (chronic obstructive pulmonary disease) ([place])   . Coronary artery disease    a. s/p remote stent to Cx 2004 with chronic stable angina in context of residual circumflex disease.  . Foot drop, left 03/11/2015  . Gait disorder 03/11/2015  . GERD (gastroesophageal reflux disease)   . Glaucoma   . Gout   . Hemiparesis and alteration of sensations as late effects of stroke ([place]) 09/25/2015  . Hyperlipidemia   . Hypertension   . Long term (current) use of anticoagulants   . 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 NSVT (nonsustained ventricular tachycardia) ([place])   . Permanent atrial fibrillation ([place])   . Pulmonary eosinophilia ([place])   . Sinus bradycardia   . Stroke ([place])   . Syncope and collapse   . Unspecified glaucoma(365.9)     Past Surgical History:  Procedure Laterality Date  . CARDIAC CATHETERIZATION N/A 07/30/2015   Procedure: Left Heart Cath and Coronary Angiography;  Surgeon: Belva Crome, MD;  Location: [facility];  Service: Cardiovascular;  Laterality:  N/A;  . COLONOSCOPY    . KNEE SURGERY    . loop recorder  09/2007    Family History  Problem Relation Age of Onset  . Colon cancer Mother   . Hypertension Mother   . Cancer Mother   . Prostate cancer Brother   . Cancer Brother   . Heart disease Father   . Heart attack Father   . Hypertension Father   . Heart attack Sister   . Cancer Sister   . Stroke Neg Hx   . Colon polyps Neg Hx   . Esophageal cancer Neg Hx   . Stomach cancer Neg Hx   . Rectal cancer Neg Hx   . Pancreatic cancer Neg Hx     Social History   Socioeconomic History  . Marital status: Divorced    Spouse name: Not on file  . Number of children: 0  . Years of education: master's  . Highest education level: Not on file  Occupational History  . Occupation: semi-retired  Tobacco Use  . Smoking status: Former Smoker    Quit date: 08/22/1983    Years since quitting: 36.9  . Smokeless tobacco: Never Used  Vaping Use  . Vaping Use: Never used  Substance and Sexual Activity  . Alcohol use: No  . Drug use: No  . Sexual activity: Not on file  Other Topics Concern  . Not on file  Social History Narrative   Patient drinks 1-2 cups of caffeine daily.  Patient is right handed.   Social Determinants of Health   Financial Resource Strain: Not on file  Food Insecurity: Not on file  Transportation Needs: Not on file  Physical Activity: Not on file  Stress: Not on file  Social Connections: Not on file  Intimate Partner Violence: Not on file    No Known Allergies  Current Outpatient Medications  Medication Sig Dispense Refill  . ACCU-CHEK FASTCLIX LANCETS MISC USE TO CHECK BLOOD SUGAR QD  6  . ACCU-CHEK GUIDE test strip USE TO CHECK BLOOD SUGAR QD  6  . acetaminophen (TYLENOL) 500 MG tablet Take 1 tablet (500 mg total) by mouth every 8 (eight) hours as needed for mild pain or moderate pain. 30 tablet 0  . aspirin 81 MG chewable tablet Chew 81 mg by mouth daily.    Marland Kitchen atorvastatin (LIPITOR) 40 MG tablet  Take 1 tablet (40 mg total) by mouth daily at 6 PM. Please make yearly appt with Dr. Meda Coffee for October before anymore refills. 1st attempt 30 tablet 0  . brimonidine (ALPHAGAN) 0.2 % ophthalmic solution Place 1 drop into both eyes 2 (two) times daily.  5  . calcium-vitamin D (OSCAL WITH D) 250-125 MG-UNIT tablet Take 1 tablet by mouth daily.    . carvedilol (COREG) 6.25 MG tablet TAKE 1 TABLET(6.25 MG) BY MOUTH TWICE DAILY WITH A MEAL 60 tablet 1  . colchicine 0.6 MG tablet Take 0.6 mg by mouth as needed.     . Continuous Blood Gluc Sensor (FREESTYLE LIBRE 14 DAY SENSOR) MISC USE AS DIRECTED EVERY 14 DAYS.    . famotidine (PEPCID) 20 MG tablet Take 20 mg by mouth 2 (two) times daily.    . ferrous sulfate 325 (65 FE) MG tablet Take 325 mg by mouth daily with breakfast.    . furosemide (LASIX) 20 MG tablet TAKE 1 TABLET(20 MG) BY MOUTH DAILY 90 tablet 3  . hydrocortisone cream 1 % Apply 1 application topically 2 (two) times daily as needed (for eczmea).    Marland Kitchen ipratropium (ATROVENT) 0.06 % nasal spray Place 2 sprays into both nostrils 4 (four) times daily. (Patient taking differently: Place 2 sprays into both nostrils as needed.) 15 mL 3  . irbesartan (AVAPRO) 300 MG tablet TAKE 1 TABLET BY MOUTH EVERY NIGHT AT BEDTIME 90 tablet 2  . isosorbide mononitrate (IMDUR) 30 MG 24 hr tablet Take 1 tablet (30 mg total) by mouth daily. Please keep upcoming appt in December before anymore refills. Thank you 90 tablet 0  . JARDIANCE 25 MG TABS tablet Take 25 mg by mouth daily.    Marland Kitchen latanoprost (XALATAN) 0.005 % ophthalmic solution Place 1 drop into both eyes at bedtime.    Marland Kitchen loratadine (CLARITIN) 10 MG tablet Take 10 mg by mouth daily.    . magnesium oxide (MAG-OX) 400 MG tablet Take 1 tablet (400 mg total) by mouth 2 (two) times daily. 180 tablet 3  . meclizine (ANTIVERT) 25 MG tablet Take 25 mg by mouth 3 (three) times daily as needed for dizziness.    . metFORMIN (GLUCOPHAGE) 500 MG tablet Take 500 mg by mouth  2 (two) times daily.    . Multiple Vitamins-Minerals (MULTIVITAMIN WITH MINERALS) tablet Take 1 tablet by mouth in the morning and at bedtime.    Marland Kitchen MYRBETRIQ 25 MG TB24 tablet Take 25 mg by mouth daily.    . nitrofurantoin, macrocrystal-monohydrate, (MACROBID) 100 MG capsule Take 100 mg by mouth at bedtime.    . nitroGLYCERIN (  NITROLINGUAL) 0.4 MG/SPRAY spray Place 1 spray under the tongue every 5 (five) minutes x 3 doses as needed for chest pain. 12 g 3  . Omega-3 Fatty Acids (FISH OIL) 1000 MG CAPS Take 1,000 mg by mouth 2 (two) times daily.     Marland Kitchen ROCKLATAN 0.02-0.005 % SOLN Apply 1 drop to eye at bedtime.    Marland Kitchen spironolactone (ALDACTONE) 25 MG tablet TAKE 1 TABLET(25 MG) BY MOUTH DAILY. NEED APPOINTMENT FOR FUTURE FILLS 30 tablet 1  . Tamsulosin HCl (FLOMAX) 0.4 MG CAPS Take 0.4 mg by mouth daily.    . traMADol (ULTRAM) 50 MG tablet Take 1 tablet (50 mg total) by mouth 3 (three) times daily as needed. 15 tablet 0  . warfarin (COUMADIN) 2 MG tablet TAKE AS DIRECTED BY COUMADIN CLINIC 180 tablet 0   No current facility-administered medications for this visit.    REVIEW OF SYSTEMS:  [X]  denotes positive finding, [ ]  denotes negative finding Cardiac  Comments:  Chest pain or chest pressure:    Shortness of breath upon exertion:    Short of breath when lying flat:    Irregular heart rhythm:        Vascular    Pain in calf, thigh, or hip brought on by ambulation:    Pain in feet at night that wakes you up from your sleep:     Blood clot in your veins:    Leg swelling:         Pulmonary    Oxygen at home:    Productive cough:     Wheezing:         Neurologic    Sudden weakness in arms or legs:     Sudden numbness in arms or legs:     Sudden onset of difficulty speaking or slurred speech:    Temporary loss of vision in one eye:     Problems with dizziness:         Gastrointestinal    Blood in stool:     Vomited blood:         Genitourinary    Burning when urinating:     Blood  in urine:        Psychiatric    Major depression:         Hematologic    Bleeding problems:    Problems with blood clotting too easily:        Skin    Rashes or ulcers:        Constitutional    Fever or chills:     PHYSICAL EXAM:   Vitals:   08/11/20 1404  BP: (!) 141/65  Pulse: 80  Resp: 18  Temp: 97.9 F (36.6 C)  SpO2: 96%  Weight: 179 lb 14.4 oz (81.6 kg)  Height: 5\' 9"  (1.753 m)    Constitutional: Well appearing in no distress. Appears well nourished.  Neurologic: Normal gait and station. CN intact. No weakness. No sensory loss. Psychiatric: Mood and affect symmetric and appropriate. Eyes: No icterus. No conjunctival pallor. Ears, nose, throat: mucous membranes moist. Midline trachea. No carotid bruit. Cardiac: regular rate and rhythm.  Respiratory: unlabored. Abdominal: soft, non-tender, non-distended. No palpable pulsatile abdominal mass. Peripheral vascular:  Radial pulse: L 2+ / R 2+  Dorsalis pedis pulse: L 1+ / R 1+ Extremity: No edema. No cyanosis. No pallor.  Skin: No gangrene. No ulceration.  Lymphatic: No Stemmer's sign. No palpable lymphadenopathy.   DATA REVIEW:    Most recent CBC CBC  Latest Ref Rng & Units 11/28/2017 02/20/2017 04/27/2016  WBC 3.4 - 10.8 x10E3/uL 3.4 2.9(L) 3.7(L)  Hemoglobin 13.0 - 17.7 g/dL 13.0 12.9(L) 12.9(L)  Hematocrit 37.5 - 51.0 % 41.0 39.1 39.2  Platelets 150 - 379 x10E3/uL 186 185 214     Most recent CMP CMP Latest Ref Rng & Units 06/25/2019 11/28/2017 02/20/2017  Glucose 65 - 99 mg/dL 186(H) 374(H) 223(H)  BUN 8 - 27 mg/dL 14 13 8   Creatinine 0.76 - 1.27 mg/dL 1.03 1.07 1.01  Sodium 134 - 144 mmol/L 138 137 138  Potassium 3.5 - 5.2 mmol/L 4.2 4.5 4.4  Chloride 96 - 106 mmol/L 102 96 100  CO2 20 - 29 mmol/L 25 25 25   Calcium 8.6 - 10.2 mg/dL 9.1 8.9 8.9  Total Protein 6.0 - 8.5 g/dL - 6.7 6.7  Total Bilirubin 0.0 - 1.2 mg/dL - 1.0 1.1  Alkaline Phos 39 - 117 IU/L - 89 83  AST 0 - 40 IU/L - 18 29  ALT 0 - 44  IU/L - 18 25    Renal function CrCl cannot be calculated (Patient's most recent lab result is older than the maximum 21 days allowed.).  Hgb A1c MFr Bld (%)  Date Value  11/28/2017 14.3 (H)    LDL Calculated  Date Value Ref Range Status  11/28/2017 72 0 - 99 mg/dL Final     Vascular Imaging: ABI 08/11/20  LOWER EXTREMITY DOPPLER STUDY   Indications: Peripheral artery disease.   High Risk Factors: Diabetes.   Other Factors: Hx left index finger pain and cool hand. Bilateral foot  pain.  Comparison Study: none   Performing Technologist: June Leap RDMS, RVT     Examination Guidelines: A complete evaluation includes at minimum, Doppler  waveform signals and systolic blood pressure reading at the level of  bilateral  brachial, anterior tibial, and posterior tibial arteries, when vessel  segments  are accessible. Bilateral testing is considered an integral part of a  complete  examination. Photoelectric Plethysmograph (PPG) waveforms and toe systolic  pressure readings are included as required and additional duplex testing  as  needed. Limited examinations for reoccurring indications may be performed  as  noted.     ABI Findings:  +---------+------------------+-----+--------+--------+  Right  Rt Pressure (mmHg)IndexWaveformComment   +---------+------------------+-----+--------+--------+  Brachial 129                     +---------+------------------+-----+--------+--------+  ATA   152        1.10 biphasic      +---------+------------------+-----+--------+--------+  PTA   198        1.43 biphasic      +---------+------------------+-----+--------+--------+  Great Toe54        0.39 Abnormal      +---------+------------------+-----+--------+--------+   +---------+------------------+-----+--------+-------+  Left   Lt Pressure (mmHg)IndexWaveformComment   +---------+------------------+-----+--------+-------+  Brachial 138                     +---------+------------------+-----+--------+-------+  ATA   133        0.96 biphasic      +---------+------------------+-----+--------+-------+  PTA   255        1.85 biphasic      +---------+------------------+-----+--------+-------+  Great Toe83        0.60 Abnormal      +---------+------------------+-----+--------+-------+      Bilateral wrist waveforms within normal limits (multiphasic).    Summary:  Right: Resting right ankle-brachial index indicates noncompressible right  lower extremity arteries.  The right toe-brachial index is abnormal.   Left: Resting left ankle-brachial index indicates noncompressible left  lower extremity arteries. The left toe-brachial index is abnormal.   Yevonne Aline. Stanford Breed, MD [facility] Phone Number: [phone] 08/11/2020 4:47 PM

## 2020-08-12 ENCOUNTER — Ambulatory Visit: Payer: Medicare Other | Admitting: Physician Assistant

## 2020-08-18 ENCOUNTER — Ambulatory Visit: Payer: Medicare Other | Admitting: Podiatry

## 2020-08-18 DIAGNOSIS — R3915 Urgency of urination: Secondary | ICD-10-CM | POA: Diagnosis not present

## 2020-08-18 DIAGNOSIS — N401 Enlarged prostate with lower urinary tract symptoms: Secondary | ICD-10-CM | POA: Diagnosis not present

## 2020-08-25 ENCOUNTER — Encounter (HOSPITAL_COMMUNITY): Payer: Self-pay | Admitting: *Deleted

## 2020-08-25 ENCOUNTER — Telehealth: Payer: Self-pay | Admitting: *Deleted

## 2020-08-25 ENCOUNTER — Other Ambulatory Visit: Payer: Self-pay

## 2020-08-25 ENCOUNTER — Ambulatory Visit (HOSPITAL_COMMUNITY)
Admission: EM | Admit: 2020-08-25 | Discharge: 2020-08-25 | Disposition: A | Discharge status: Home / Self Care | Payer: Medicare Other | Attending: Internal Medicine | Admitting: Internal Medicine

## 2020-08-25 DIAGNOSIS — M79672 Pain in left foot: Secondary | ICD-10-CM | POA: Insufficient documentation

## 2020-08-25 LAB — CBC WITH DIFFERENTIAL/PLATELET
Abs Immature Granulocytes: 0.03 10*3/uL (ref 0.00–0.07)
Basophils Absolute: 0 10*3/uL (ref 0.0–0.1)
Basophils Relative: 1 %
Eosinophils Absolute: 0.1 10*3/uL (ref 0.0–0.5)
Eosinophils Relative: 2 %
HCT: 46.1 % (ref 39.0–52.0)
Hemoglobin: 14.2 g/dL (ref 13.0–17.0)
Immature Granulocytes: 1 %
Lymphocytes Relative: 14 %
Lymphs Abs: 0.9 10*3/uL (ref 0.7–4.0)
MCH: 26.5 pg (ref 26.0–34.0)
MCHC: 30.8 g/dL (ref 30.0–36.0)
MCV: 86 fL (ref 80.0–100.0)
Monocytes Absolute: 0.6 10*3/uL (ref 0.1–1.0)
Monocytes Relative: 11 %
Neutro Abs: 4.4 10*3/uL (ref 1.7–7.7)
Neutrophils Relative %: 71 %
Platelets: 220 10*3/uL (ref 150–400)
RBC: 5.36 MIL/uL (ref 4.22–5.81)
RDW: 15.2 % (ref 11.5–15.5)
WBC: 6 10*3/uL (ref 4.0–10.5)
nRBC: 0 % (ref 0.0–0.2)

## 2020-08-25 LAB — URIC ACID: Uric Acid, Serum: 5.3 mg/dL (ref 3.7–8.6)

## 2020-08-25 MED ORDER — DICLOFENAC SODIUM 1 % EX GEL: 2.0000 g | Freq: Four times a day (QID) | CUTANEOUS | 0 refills | Status: DC

## 2020-08-25 NOTE — Discharge Instructions (Addendum)
I will call you when your blood work is back. I am sorry you had to wait this long and there are no results ready.

## 2020-08-25 NOTE — ED Triage Notes (Signed)
Pt reports 3 days of rt toe pain. Pt reports pain increases when walking.

## 2020-08-25 NOTE — ED Provider Notes (Signed)
[facility]    CSN: [id number] Arrival date & time: 08/25/20  1642      History   Chief Complaint Chief Complaint  Patient presents with  . Foot Pain    left    HPI Vincent [name] is a 84 y.o. male who is here due to unresolved L foot pain since seen here in October this year. Has seen podiatry and vascular specialist. He does not really know what is going on. His vascular records has abnormal right and left ABI's.  I also reviewed the foot xray he had this year and showed some OA.  He denies any new recent injury to his L foot. Has had gout in the past, and the last time was 3 years ago.    Past Medical History:  Diagnosis Date  . Alcoholic cardiomyopathy ([place])   . Bradycardia   . Chronic systolic CHF (congestive heart failure) ([place])   . 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 COPD (chronic obstructive pulmonary disease) ([place])   . Coronary artery disease    a. s/p remote stent to Cx 2004 with chronic stable angina in context of residual circumflex disease.  . Foot drop, left 03/11/2015  . Gait disorder 03/11/2015  . GERD (gastroesophageal reflux disease)   . Glaucoma   . Gout   . Hemiparesis and alteration of sensations as late effects of stroke ([place]) 09/25/2015  . Hyperlipidemia   . Hypertension   . Long term (current) use of anticoagulants   . 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 NSVT (nonsustained ventricular tachycardia) ([place])   . Permanent atrial fibrillation ([place])   . Pulmonary eosinophilia ([place])   . Sinus bradycardia   . Stroke ([place])   . Syncope and collapse   . Unspecified glaucoma(365.9)     Patient Active Problem List   Diagnosis Date Noted  . Chest pain 11/25/2015  . 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 Hemiparesis and alteration of sensations as late effects of stroke ([place]) 09/25/2015  . Abnormal stress test 07/21/2015  . Foot drop, left 03/11/2015  . Gait disorder 03/11/2015  .  Precordial pain 03/19/2014  . Dizziness 03/17/2014  . Near syncope 03/17/2014  . Encounter for therapeutic drug monitoring 09/24/2013  . Rectal bleeding 10/19/2012  . External hemorrhoid, bleeding 10/19/2012  . Long term current use of anticoagulant 10/12/2010  . GLAUCOMA 09/17/2010  . GERD 09/17/2010  . HEMORRHAGE OF RECTUM AND ANUS 09/17/2010  . BRADYCARDIA 06/09/2010  . VENTRICULAR TACHYCARDIA 12/30/2008  . SYNCOPE 12/30/2008  . PULMONARY INFILTRATE INCLUDES (EOSINOPHILIA) 12/28/2007  . HLD (hyperlipidemia) 12/27/2007  . Essential hypertension 12/27/2007  . Coronary atherosclerosis 12/27/2007  . Alcoholic cardiomyopathy ([place]) 12/27/2007  . PAROXYSMAL ATRIAL FIBRILLATION 12/27/2007  . Cerebral artery occlusion with cerebral infarction ([place]) 12/27/2007  . COUGH 12/27/2007    Past Surgical History:  Procedure Laterality Date  . CARDIAC CATHETERIZATION N/A 07/30/2015   Procedure: Left Heart Cath and Coronary Angiography;  Surgeon: Belva Crome, MD;  Location: [facility];  Service: Cardiovascular;  Laterality: N/A;  . COLONOSCOPY    . KNEE SURGERY    . loop recorder  09/2007       Home Medications    Prior to Admission medications   Medication Sig Start Date End Date Taking? Authorizing Provider  ACCU-CHEK FASTCLIX LANCETS MISC USE TO CHECK BLOOD SUGAR QD 06/05/18  Yes [provider]  ACCU-CHEK GUIDE test strip USE  TO CHECK BLOOD SUGAR QD 06/07/18  Yes [provider]  acetaminophen (TYLENOL) 500 MG tablet Take 1 tablet (500 mg total) by mouth every 8 (eight) hours as needed for mild pain or moderate pain. 09/15/19  Yes Augusto Gamble B, NP  aspirin 81 MG chewable tablet Chew 81 mg by mouth daily.   Yes [provider]  atorvastatin (LIPITOR) 40 MG tablet Take 1 tablet (40 mg total) by mouth daily at 6 PM. Please make yearly appt with Dr. Meda Coffee for October before anymore refills. 1st attempt 05/27/20  Yes [name], MD  brimonidine  [facility]) 0.2 % ophthalmic solution Place 1 drop into both eyes 2 (two) times daily. 07/30/16  Yes [provider]  calcium-vitamin D (OSCAL WITH D) 250-125 MG-UNIT tablet Take 1 tablet by mouth daily.   Yes [provider]  carvedilol (COREG) 6.25 MG tablet TAKE 1 TABLET(6.25 MG) BY MOUTH TWICE DAILY WITH A MEAL 07/13/20  Yes [name], MD  Continuous Blood Gluc Sensor (FREESTYLE LIBRE 14 DAY SENSOR) MISC USE AS DIRECTED EVERY 14 DAYS. 03/05/19  Yes [provider]  diclofenac Sodium (VOLTAREN) 1 % GEL Apply 2 g topically 4 (four) times daily. As needed on painful areas of left foot 08/25/20  Yes [name], [name], PA-C  famotidine (PEPCID) 20 MG tablet Take 20 mg by mouth 2 (two) times daily.   Yes [provider]  ferrous sulfate 325 (65 FE) MG tablet Take 325 mg by mouth daily with breakfast.   Yes [provider]  furosemide (LASIX) 20 MG tablet TAKE 1 TABLET(20 MG) BY MOUTH DAILY 08/07/20  Yes [name], MD  irbesartan (AVAPRO) 300 MG tablet TAKE 1 TABLET BY MOUTH EVERY NIGHT AT BEDTIME 03/23/20  Yes [name], MD  JARDIANCE 25 MG TABS tablet Take 25 mg by mouth daily. 02/04/20  Yes [provider]  latanoprost (XALATAN) 0.005 % ophthalmic solution Place 1 drop into both eyes at bedtime.   Yes [provider]  loratadine (CLARITIN) 10 MG tablet Take 10 mg by mouth daily.   Yes [provider]  magnesium oxide (MAG-OX) 400 MG tablet Take 1 tablet (400 mg total) by mouth 2 (two) times daily. 09/06/19  Yes Dunn, Nedra Hai, PA-C  meclizine (ANTIVERT) 25 MG tablet Take 25 mg by mouth 3 (three) times daily as needed for dizziness.   Yes [provider]  metFORMIN (GLUCOPHAGE) 500 MG tablet Take 500 mg by mouth 2 (two) times daily. 06/16/19  Yes [provider]  Multiple Vitamins-Minerals (MULTIVITAMIN WITH MINERALS) tablet Take 1 tablet by mouth in the morning and at bedtime.   Yes  [provider]  Omega-3 Fatty Acids (FISH OIL) 1000 MG CAPS Take 1,000 mg by mouth 2 (two) times daily.    Yes [provider]  ROCKLATAN 0.02-0.005 % SOLN Apply 1 drop to eye at bedtime. 02/03/20  Yes [provider]  spironolactone (ALDACTONE) 25 MG tablet TAKE 1 TABLET(25 MG) BY MOUTH DAILY. NEED APPOINTMENT FOR FUTURE FILLS 07/13/20  Yes [name], MD  Tamsulosin HCl (FLOMAX) 0.4 MG CAPS Take 0.4 mg by mouth daily. 12/28/11  Yes [provider]  traMADol (ULTRAM) 50 MG tablet Take 1 tablet (50 mg total) by mouth 3 (three) times daily as needed. 06/01/20  Yes Volney American, PA-C  warfarin (COUMADIN) 2 MG tablet TAKE AS DIRECTED BY COUMADIN CLINIC 06/04/20  Yes [name], MD  colchicine 0.6 MG tablet Take  0.6 mg by mouth as needed.     [provider]  hydrocortisone cream 1 % Apply 1 application topically 2 (two) times daily as needed (for eczmea).    [provider]  ipratropium (ATROVENT) 0.06 % nasal spray Place 2 sprays into both nostrils 4 (four) times daily. Patient taking differently: Place 2 sprays into both nostrils as needed. 11/21/16   Dorena Bodo, NP  isosorbide mononitrate (IMDUR) 30 MG 24 hr tablet Take 1 tablet (30 mg total) by mouth daily. Please keep upcoming appt in December before anymore refills. Thank you 06/30/20   Lars Masson, MD  MYRBETRIQ 25 MG TB24 tablet Take 25 mg by mouth daily. 02/02/20   [provider]  nitrofurantoin, macrocrystal-monohydrate, (MACROBID) 100 MG capsule Take 100 mg by mouth at bedtime.    [provider]  nitroGLYCERIN (NITROLINGUAL) 0.4 MG/SPRAY spray Place 1 spray under the tongue every 5 (five) minutes x 3 doses as needed for chest pain. 12/11/19   Lars Masson, MD    Family History Family History  Problem Relation Age of Onset  . Colon cancer Mother   . Hypertension Mother   . Cancer Mother   . Prostate cancer Brother   . Cancer  Brother   . Heart disease Father   . Heart attack Father   . Hypertension Father   . Heart attack Sister   . Cancer Sister   . Stroke Neg Hx   . Colon polyps Neg Hx   . Esophageal cancer Neg Hx   . Stomach cancer Neg Hx   . Rectal cancer Neg Hx   . Pancreatic cancer Neg Hx     Social History Social History   Tobacco Use  . Smoking status: Former Smoker    Quit date: 08/22/1983    Years since quitting: 37.0  . Smokeless tobacco: Never Used  Vaping Use  . Vaping Use: Never used  Substance Use Topics  . Alcohol use: No  . Drug use: No     Allergies   Patient has no known allergies.   Review of Systems Review of Systems  Musculoskeletal: Positive for arthralgias, gait problem and joint swelling.  Skin: Negative for rash and wound.  Neurological: Positive for numbness.       Has neuropathy     Physical Exam Triage Vital Signs ED Triage Vitals  Enc Vitals Group     BP 08/25/20 1656 (!) 154/82     Pulse Rate 08/25/20 1656 85     Resp 08/25/20 1656 18     Temp 08/25/20 1656 98.1 F (36.7 C)     Temp Source 08/25/20 1656 Oral     SpO2 08/25/20 1656 98 %     Weight --      Height --      Head Circumference --      Peak Flow --      Pain Score 08/25/20 1701 10     Pain Loc --      Pain Edu? --      Excl. in GC? --    No data found.  Updated Vital Signs BP (!) 154/82 (BP Location: Left Arm)   Pulse 85   Temp 98.1 F (36.7 C) (Oral)   Resp 18   SpO2 98%   Visual Acuity Right Eye Distance:   Left Eye Distance:   Bilateral Distance:    Right Eye Near:   Left Eye Near:    Bilateral Near:  Physical Exam Vitals and nursing note reviewed.  Constitutional:      General: He is not in acute distress.    Appearance: He is not toxic-appearing.     Comments: Sitting on a wheelchair  HENT:     Right Ear: External ear normal.     Left Ear: External ear normal.  Eyes:     General: No scleral icterus.    Conjunctiva/sclera: Conjunctivae normal.   Pulmonary:     Effort: Pulmonary effort is normal.  Musculoskeletal:     Cervical back: Neck supple.     Comments: L FOOT- with severe pain upon moving his toes 5th to 2nd. There is mild swelling on the dorsal foot, and mild tenderness on the metatarsal region, but not guarded as when I examined his toes. He does not have any wound or open skin between toes or under his foot. There is no erythema or warmth.   Neurological:     Mental Status: He is alert and oriented to person, place, and time.  Psychiatric:        Mood and Affect: Mood normal.        Behavior: Behavior normal.        Thought Content: Thought content normal.      UC Treatments / Results  Labs (all labs ordered are listed, but only abnormal results are displayed) Labs Reviewed  CBC WITH DIFFERENTIAL/PLATELET  URIC ACID  CBC and uric acid are normal.  EKG   Radiology No results found.  Procedures Procedures (including critical care time)  Medications Ordered in UC Medications - No data to display  Initial Impression / Assessment and Plan / UC Course  I have reviewed the triage vital signs and the nursing notes. Pt seems to be having a flair if his arthritis and I sent Voltaren gel to his pharmacy.  I left a voice mail with his results.  Pertinent labs  results that were available during my care of the patient were reviewed by me and considered in my medical decision making (see chart for details).   Final Clinical Impressions(s) / UC Diagnoses   Final diagnoses:  Foot pain, left     Discharge Instructions     I will call you when your blood work is back. I am sorry you had to wait this long and there are no results ready.     ED Prescriptions    Medication Sig Dispense Auth. Provider   diclofenac Sodium (VOLTAREN) 1 % GEL Apply 2 g topically 4 (four) times daily. As needed on painful areas of left foot 350 g [name], [name], PA-C     PDMP not reviewed this encounter.    Shelby Mattocks, [place] 08/25/20 1933

## 2020-08-25 NOTE — Telephone Encounter (Signed)
Called pt since he missed his Anticoagulation Appt. Left a message for the pt to call back. Will await a response from the pt.

## 2020-08-31 ENCOUNTER — Encounter: Payer: Self-pay | Admitting: Podiatry

## 2020-08-31 ENCOUNTER — Ambulatory Visit: Payer: Medicare Other | Admitting: Podiatry

## 2020-08-31 ENCOUNTER — Other Ambulatory Visit: Payer: Self-pay

## 2020-08-31 ENCOUNTER — Ambulatory Visit (INDEPENDENT_AMBULATORY_CARE_PROVIDER_SITE_OTHER): Payer: Medicare Other

## 2020-08-31 DIAGNOSIS — M109 Gout, unspecified: Secondary | ICD-10-CM

## 2020-08-31 DIAGNOSIS — D689 Coagulation defect, unspecified: Secondary | ICD-10-CM | POA: Diagnosis not present

## 2020-08-31 DIAGNOSIS — M79675 Pain in left toe(s): Secondary | ICD-10-CM

## 2020-08-31 DIAGNOSIS — B351 Tinea unguium: Secondary | ICD-10-CM

## 2020-08-31 DIAGNOSIS — M779 Enthesopathy, unspecified: Secondary | ICD-10-CM | POA: Diagnosis not present

## 2020-08-31 DIAGNOSIS — M79674 Pain in right toe(s): Secondary | ICD-10-CM

## 2020-08-31 DIAGNOSIS — E1142 Type 2 diabetes mellitus with diabetic polyneuropathy: Secondary | ICD-10-CM

## 2020-09-01 ENCOUNTER — Other Ambulatory Visit: Payer: Self-pay | Admitting: Cardiology

## 2020-09-01 ENCOUNTER — Ambulatory Visit: Payer: Medicare Other | Admitting: *Deleted

## 2020-09-01 DIAGNOSIS — Z5181 Encounter for therapeutic drug level monitoring: Secondary | ICD-10-CM | POA: Diagnosis not present

## 2020-09-01 DIAGNOSIS — I6350 Cerebral infarction due to unspecified occlusion or stenosis of unspecified cerebral artery: Secondary | ICD-10-CM | POA: Diagnosis not present

## 2020-09-01 DIAGNOSIS — I482 Chronic atrial fibrillation, unspecified: Secondary | ICD-10-CM

## 2020-09-01 LAB — POCT INR: INR: 1.1 — AB (ref 2.0–3.0)

## 2020-09-01 NOTE — Progress Notes (Signed)
Subjective:   Patient ID: Vincent [name], male   DOB: 85 y.o.   MRN: [id number]   HPI Patient states he has less pain around his big toe joint but the second joint has become very inflamed he has nail disease that he cannot cut and they are getting sore making wearing shoe gear difficult   ROS      Objective:  Physical Exam  Neurovascular status unchanged with structural bunion deformity left still present but no pain around it anymore but quite a bit of inflammation around the second MPJ with elongated incurvated thickened nailbeds 1-5 both feet     Assessment:  Inflammatory capsulitis second MPJ left with pain with bunion deformity left no indications of capsulitis of the first MPJ with thick yellow brittle nailbeds bilateral     Plan:  H&P reviewed all conditions and for the second MPJ I did a sterile block of the forefoot I aspirated the joint getting out a small amount of clear fluid injected quarter cc dexamethasone Kenalog and advised on rigid bottom shoes.  I debrided nailbeds 1-5 both feet I discussed gout food modifications and patient will be seen back depending on response

## 2020-09-01 NOTE — Patient Instructions (Signed)
Description    Take 2 tablets today and 2.5 tablets tomorrow, then continue taking 2 tablets daily except for 1 tablet on Tuesdays. Recheck INR in 1 week. Call our office if you have any concerns [phone].

## 2020-09-02 ENCOUNTER — Other Ambulatory Visit: Payer: Self-pay | Admitting: Pharmacist

## 2020-09-02 MED ORDER — WARFARIN SODIUM 2 MG PO TABS: ORAL_TABLET | ORAL | 0 refills | Status: DC

## 2020-09-02 NOTE — Telephone Encounter (Signed)
Pt is noncompliant with visits, unable to send in 90 day supply at this time. 30 day supply sent 09/01/2020.

## 2020-09-04 ENCOUNTER — Other Ambulatory Visit: Payer: Self-pay | Admitting: Cardiology

## 2020-09-04 NOTE — Telephone Encounter (Signed)
Pt has been noncompliant with appts so unable to send in 90 day supply.

## 2020-09-09 DIAGNOSIS — I509 Heart failure, unspecified: Secondary | ICD-10-CM | POA: Diagnosis not present

## 2020-09-09 DIAGNOSIS — N400 Enlarged prostate without lower urinary tract symptoms: Secondary | ICD-10-CM | POA: Diagnosis not present

## 2020-09-09 DIAGNOSIS — I10 Essential (primary) hypertension: Secondary | ICD-10-CM | POA: Diagnosis not present

## 2020-09-09 DIAGNOSIS — E1165 Type 2 diabetes mellitus with hyperglycemia: Secondary | ICD-10-CM | POA: Diagnosis not present

## 2020-09-21 ENCOUNTER — Other Ambulatory Visit: Payer: Self-pay | Admitting: Cardiology

## 2020-09-21 DIAGNOSIS — I10 Essential (primary) hypertension: Secondary | ICD-10-CM

## 2020-09-21 DIAGNOSIS — E785 Hyperlipidemia, unspecified: Secondary | ICD-10-CM

## 2020-09-21 DIAGNOSIS — Z5181 Encounter for therapeutic drug level monitoring: Secondary | ICD-10-CM

## 2020-09-21 DIAGNOSIS — I482 Chronic atrial fibrillation, unspecified: Secondary | ICD-10-CM

## 2020-09-25 ENCOUNTER — Other Ambulatory Visit: Payer: Self-pay | Admitting: Cardiology

## 2020-09-29 ENCOUNTER — Telehealth: Payer: Self-pay | Admitting: *Deleted

## 2020-09-29 DIAGNOSIS — N3281 Overactive bladder: Secondary | ICD-10-CM | POA: Diagnosis not present

## 2020-09-29 DIAGNOSIS — N401 Enlarged prostate with lower urinary tract symptoms: Secondary | ICD-10-CM | POA: Diagnosis not present

## 2020-09-29 DIAGNOSIS — R3915 Urgency of urination: Secondary | ICD-10-CM | POA: Diagnosis not present

## 2020-09-29 NOTE — Telephone Encounter (Signed)
Called pt since he is overdue for Anticoagulation Appt; left a message for the pt to call back to reschedule.

## 2020-10-05 ENCOUNTER — Ambulatory Visit (INDEPENDENT_AMBULATORY_CARE_PROVIDER_SITE_OTHER): Payer: Medicare Other

## 2020-10-05 ENCOUNTER — Other Ambulatory Visit: Payer: Self-pay

## 2020-10-05 DIAGNOSIS — I482 Chronic atrial fibrillation, unspecified: Secondary | ICD-10-CM

## 2020-10-05 DIAGNOSIS — I4891 Unspecified atrial fibrillation: Secondary | ICD-10-CM

## 2020-10-05 DIAGNOSIS — I6350 Cerebral infarction due to unspecified occlusion or stenosis of unspecified cerebral artery: Secondary | ICD-10-CM | POA: Diagnosis not present

## 2020-10-05 DIAGNOSIS — Z5181 Encounter for therapeutic drug level monitoring: Secondary | ICD-10-CM

## 2020-10-05 LAB — POCT INR: INR: 1.7 — AB (ref 2.0–3.0)

## 2020-10-05 NOTE — Patient Instructions (Signed)
-   Take extra 1/2 tablet today, then continue taking 2 tablets daily except for 1 tablet on Tuesdays.  - Recheck INR next week. Call our office if you have any concerns [phone].

## 2020-10-06 DIAGNOSIS — N400 Enlarged prostate without lower urinary tract symptoms: Secondary | ICD-10-CM | POA: Diagnosis not present

## 2020-10-06 DIAGNOSIS — I10 Essential (primary) hypertension: Secondary | ICD-10-CM | POA: Diagnosis not present

## 2020-10-06 DIAGNOSIS — I509 Heart failure, unspecified: Secondary | ICD-10-CM | POA: Diagnosis not present

## 2020-10-06 DIAGNOSIS — E1165 Type 2 diabetes mellitus with hyperglycemia: Secondary | ICD-10-CM | POA: Diagnosis not present

## 2020-10-28 ENCOUNTER — Telehealth: Payer: Self-pay | Admitting: *Deleted

## 2020-10-28 NOTE — Telephone Encounter (Signed)
Attempted to call pt to make him aware that he is overdue to have his INR checked. Unable to leave message, mailbox is full.

## 2020-11-03 ENCOUNTER — Other Ambulatory Visit: Payer: Self-pay

## 2020-11-03 ENCOUNTER — Ambulatory Visit (INDEPENDENT_AMBULATORY_CARE_PROVIDER_SITE_OTHER): Payer: Medicare Other

## 2020-11-03 DIAGNOSIS — Z5181 Encounter for therapeutic drug level monitoring: Secondary | ICD-10-CM | POA: Diagnosis not present

## 2020-11-03 DIAGNOSIS — I482 Chronic atrial fibrillation, unspecified: Secondary | ICD-10-CM

## 2020-11-03 DIAGNOSIS — I4891 Unspecified atrial fibrillation: Secondary | ICD-10-CM | POA: Diagnosis not present

## 2020-11-03 DIAGNOSIS — I6350 Cerebral infarction due to unspecified occlusion or stenosis of unspecified cerebral artery: Secondary | ICD-10-CM

## 2020-11-03 LAB — POCT INR: INR: 1.9 — AB (ref 2.0–3.0)

## 2020-11-03 NOTE — Patient Instructions (Signed)
-   Take 2 tablets warfarin today, then continue taking 2 tablets daily except for 1 tablet on Tuesdays.  - Recheck INR in 2 weeks Call our office if you have any concerns [phone].

## 2020-11-08 ENCOUNTER — Other Ambulatory Visit: Payer: Self-pay | Admitting: Physician Assistant

## 2020-11-09 DIAGNOSIS — E1142 Type 2 diabetes mellitus with diabetic polyneuropathy: Secondary | ICD-10-CM | POA: Diagnosis not present

## 2020-11-09 DIAGNOSIS — I10 Essential (primary) hypertension: Secondary | ICD-10-CM | POA: Diagnosis not present

## 2020-11-09 DIAGNOSIS — M109 Gout, unspecified: Secondary | ICD-10-CM | POA: Diagnosis not present

## 2020-11-09 DIAGNOSIS — I509 Heart failure, unspecified: Secondary | ICD-10-CM | POA: Diagnosis not present

## 2020-11-10 DIAGNOSIS — E1165 Type 2 diabetes mellitus with hyperglycemia: Secondary | ICD-10-CM | POA: Diagnosis not present

## 2020-11-10 DIAGNOSIS — I10 Essential (primary) hypertension: Secondary | ICD-10-CM | POA: Diagnosis not present

## 2020-11-10 DIAGNOSIS — E785 Hyperlipidemia, unspecified: Secondary | ICD-10-CM | POA: Diagnosis not present

## 2020-11-10 DIAGNOSIS — N400 Enlarged prostate without lower urinary tract symptoms: Secondary | ICD-10-CM | POA: Diagnosis not present

## 2020-11-14 ENCOUNTER — Other Ambulatory Visit: Payer: Self-pay | Admitting: Cardiology

## 2020-11-19 ENCOUNTER — Ambulatory Visit (INDEPENDENT_AMBULATORY_CARE_PROVIDER_SITE_OTHER): Payer: Medicare Other | Admitting: *Deleted

## 2020-11-19 ENCOUNTER — Other Ambulatory Visit: Payer: Self-pay

## 2020-11-19 DIAGNOSIS — I6350 Cerebral infarction due to unspecified occlusion or stenosis of unspecified cerebral artery: Secondary | ICD-10-CM

## 2020-11-19 DIAGNOSIS — Z5181 Encounter for therapeutic drug level monitoring: Secondary | ICD-10-CM

## 2020-11-19 DIAGNOSIS — I482 Chronic atrial fibrillation, unspecified: Secondary | ICD-10-CM

## 2020-11-19 LAB — POCT INR: INR: 1.7 — AB (ref 2.0–3.0)

## 2020-11-19 NOTE — Patient Instructions (Signed)
Description   Today take 3 tablets warfarin today, then start taking 2 tablets daily. Recheck INR in 2 weeks. Call our office if you have any concerns [phone].

## 2020-12-02 DIAGNOSIS — I10 Essential (primary) hypertension: Secondary | ICD-10-CM | POA: Diagnosis not present

## 2020-12-02 DIAGNOSIS — I509 Heart failure, unspecified: Secondary | ICD-10-CM | POA: Diagnosis not present

## 2020-12-02 DIAGNOSIS — N400 Enlarged prostate without lower urinary tract symptoms: Secondary | ICD-10-CM | POA: Diagnosis not present

## 2020-12-02 DIAGNOSIS — E1165 Type 2 diabetes mellitus with hyperglycemia: Secondary | ICD-10-CM | POA: Diagnosis not present

## 2020-12-03 ENCOUNTER — Ambulatory Visit (INDEPENDENT_AMBULATORY_CARE_PROVIDER_SITE_OTHER): Payer: Medicare Other

## 2020-12-03 ENCOUNTER — Other Ambulatory Visit: Payer: Self-pay

## 2020-12-03 DIAGNOSIS — Z5181 Encounter for therapeutic drug level monitoring: Secondary | ICD-10-CM

## 2020-12-03 DIAGNOSIS — I6350 Cerebral infarction due to unspecified occlusion or stenosis of unspecified cerebral artery: Secondary | ICD-10-CM | POA: Diagnosis not present

## 2020-12-03 DIAGNOSIS — I482 Chronic atrial fibrillation, unspecified: Secondary | ICD-10-CM | POA: Diagnosis not present

## 2020-12-03 LAB — POCT INR: INR: 1.6 — AB (ref 2.0–3.0)

## 2020-12-03 NOTE — Patient Instructions (Signed)
Description   Today take 3 tablets warfarin today, then resume same dosage 2 tablets daily. Recheck INR in 2 weeks. Call our office if you have any concerns [phone].

## 2020-12-24 ENCOUNTER — Other Ambulatory Visit: Payer: Self-pay

## 2020-12-24 ENCOUNTER — Ambulatory Visit (INDEPENDENT_AMBULATORY_CARE_PROVIDER_SITE_OTHER): Payer: Medicare Other | Admitting: *Deleted

## 2020-12-24 DIAGNOSIS — I482 Chronic atrial fibrillation, unspecified: Secondary | ICD-10-CM

## 2020-12-24 DIAGNOSIS — Z5181 Encounter for therapeutic drug level monitoring: Secondary | ICD-10-CM | POA: Diagnosis not present

## 2020-12-24 DIAGNOSIS — I6350 Cerebral infarction due to unspecified occlusion or stenosis of unspecified cerebral artery: Secondary | ICD-10-CM

## 2020-12-24 LAB — POCT INR: INR: 1.4 — AB (ref 2.0–3.0)

## 2020-12-24 NOTE — Patient Instructions (Signed)
Description   Since you took 8mg  yesterday, then continue taking 2 tablets daily. Recheck INR in 1 week. Call our office if you have any concerns [phone].

## 2020-12-31 ENCOUNTER — Other Ambulatory Visit: Payer: Self-pay

## 2020-12-31 ENCOUNTER — Ambulatory Visit: Payer: Medicare Other | Admitting: *Deleted

## 2020-12-31 DIAGNOSIS — Z5181 Encounter for therapeutic drug level monitoring: Secondary | ICD-10-CM | POA: Diagnosis not present

## 2020-12-31 DIAGNOSIS — I482 Chronic atrial fibrillation, unspecified: Secondary | ICD-10-CM | POA: Diagnosis not present

## 2020-12-31 DIAGNOSIS — I6350 Cerebral infarction due to unspecified occlusion or stenosis of unspecified cerebral artery: Secondary | ICD-10-CM

## 2020-12-31 LAB — POCT INR: INR: 1.6 — AB (ref 2.0–3.0)

## 2020-12-31 NOTE — Patient Instructions (Addendum)
Description   Instructed for pt to take 3 tablets (6mg ) of warfarin today, then continue to take warfarin 2 tablets (4mg ) daily. Recheck INR in 1 week. Coumadin Clinic [phone].

## 2021-01-08 ENCOUNTER — Ambulatory Visit (INDEPENDENT_AMBULATORY_CARE_PROVIDER_SITE_OTHER): Payer: Medicare Other | Admitting: *Deleted

## 2021-01-08 ENCOUNTER — Other Ambulatory Visit: Payer: Self-pay

## 2021-01-08 DIAGNOSIS — I482 Chronic atrial fibrillation, unspecified: Secondary | ICD-10-CM | POA: Diagnosis not present

## 2021-01-08 DIAGNOSIS — Z5181 Encounter for therapeutic drug level monitoring: Secondary | ICD-10-CM | POA: Diagnosis not present

## 2021-01-08 DIAGNOSIS — I6350 Cerebral infarction due to unspecified occlusion or stenosis of unspecified cerebral artery: Secondary | ICD-10-CM | POA: Diagnosis not present

## 2021-01-08 LAB — POCT INR: INR: 1.5 — AB (ref 2.0–3.0)

## 2021-01-08 NOTE — Patient Instructions (Signed)
Description   Today take 3 tablets (6mg ) of warfarin then start taking warfarin 2 tablets (4mg ) daily except 3 tablets on Mondays and Fridays. Recheck INR in 1 week. [facility]in Clinic [phone].

## 2021-01-14 ENCOUNTER — Ambulatory Visit: Payer: Medicare Other

## 2021-01-14 ENCOUNTER — Other Ambulatory Visit: Payer: Self-pay

## 2021-01-14 DIAGNOSIS — Z5181 Encounter for therapeutic drug level monitoring: Secondary | ICD-10-CM | POA: Diagnosis not present

## 2021-01-14 DIAGNOSIS — I6350 Cerebral infarction due to unspecified occlusion or stenosis of unspecified cerebral artery: Secondary | ICD-10-CM | POA: Diagnosis not present

## 2021-01-14 DIAGNOSIS — I482 Chronic atrial fibrillation, unspecified: Secondary | ICD-10-CM | POA: Diagnosis not present

## 2021-01-14 LAB — POCT INR: INR: 1.5 — AB (ref 2.0–3.0)

## 2021-01-14 NOTE — Patient Instructions (Signed)
Description   Today take 3 tablets (6mg ) of warfarin then start taking warfarin 2 tablets (4mg ) daily except 3 tablets (6mg ) on Mondays and Fridays. Recheck INR in 1 week. [facility] [phone].

## 2021-01-22 ENCOUNTER — Other Ambulatory Visit: Payer: Self-pay

## 2021-01-22 ENCOUNTER — Ambulatory Visit: Payer: Medicare Other | Admitting: *Deleted

## 2021-01-22 DIAGNOSIS — I6350 Cerebral infarction due to unspecified occlusion or stenosis of unspecified cerebral artery: Secondary | ICD-10-CM | POA: Diagnosis not present

## 2021-01-22 DIAGNOSIS — I482 Chronic atrial fibrillation, unspecified: Secondary | ICD-10-CM | POA: Diagnosis not present

## 2021-01-22 DIAGNOSIS — Z5181 Encounter for therapeutic drug level monitoring: Secondary | ICD-10-CM

## 2021-01-22 LAB — POCT INR: INR: 3.2 — AB (ref 2.0–3.0)

## 2021-01-22 NOTE — Patient Instructions (Signed)
Description   Today take 2 tablets (4mg ) of warfarin then continue taking warfarin 2 tablets (4mg ) daily except 3 tablets (6mg ) on Mondays and Fridays. Recheck INR in 2 weeks. [facility] [phone].

## 2021-01-27 ENCOUNTER — Other Ambulatory Visit: Payer: Self-pay

## 2021-01-27 MED ORDER — WARFARIN SODIUM 2 MG PO TABS: ORAL_TABLET | ORAL | 3 refills | Status: DC

## 2021-02-01 ENCOUNTER — Telehealth: Payer: Self-pay | Admitting: Pharmacist

## 2021-02-01 NOTE — Telephone Encounter (Signed)
Patient called, upset that pharmacy only gave him 12 pills of warfarin.  Explained I do not know why they gave 12 pills when Rx was sent on 01/27/21 for 70 tablets with 3 refills. Patient says he is now out of warfarin.  Advised patient needs to call pharmacy and request refill.

## 2021-02-04 ENCOUNTER — Other Ambulatory Visit: Payer: Self-pay

## 2021-02-04 ENCOUNTER — Ambulatory Visit (INDEPENDENT_AMBULATORY_CARE_PROVIDER_SITE_OTHER): Payer: Medicare Other | Admitting: *Deleted

## 2021-02-04 DIAGNOSIS — Z5181 Encounter for therapeutic drug level monitoring: Secondary | ICD-10-CM | POA: Diagnosis not present

## 2021-02-04 DIAGNOSIS — I482 Chronic atrial fibrillation, unspecified: Secondary | ICD-10-CM | POA: Diagnosis not present

## 2021-02-04 DIAGNOSIS — I6350 Cerebral infarction due to unspecified occlusion or stenosis of unspecified cerebral artery: Secondary | ICD-10-CM | POA: Diagnosis not present

## 2021-02-04 LAB — POCT INR: INR: 1.6 — AB (ref 2.0–3.0)

## 2021-02-04 NOTE — Patient Instructions (Signed)
Description   Today take 3 tablets (6mg ) of warfarin then continue taking warfarin 2 tablets (4mg ) daily except 3 tablets (6mg ) on Mondays and Fridays. Recheck INR in 2 weeks. [facility]in Clinic [phone].

## 2021-02-09 DIAGNOSIS — I509 Heart failure, unspecified: Secondary | ICD-10-CM | POA: Diagnosis not present

## 2021-02-09 DIAGNOSIS — E1165 Type 2 diabetes mellitus with hyperglycemia: Secondary | ICD-10-CM | POA: Diagnosis not present

## 2021-02-09 DIAGNOSIS — I10 Essential (primary) hypertension: Secondary | ICD-10-CM | POA: Diagnosis not present

## 2021-02-09 DIAGNOSIS — N400 Enlarged prostate without lower urinary tract symptoms: Secondary | ICD-10-CM | POA: Diagnosis not present

## 2021-02-19 ENCOUNTER — Ambulatory Visit: Payer: Medicare Other | Admitting: Pharmacist

## 2021-02-19 ENCOUNTER — Other Ambulatory Visit: Payer: Self-pay

## 2021-02-19 DIAGNOSIS — I6350 Cerebral infarction due to unspecified occlusion or stenosis of unspecified cerebral artery: Secondary | ICD-10-CM

## 2021-02-19 DIAGNOSIS — Z5181 Encounter for therapeutic drug level monitoring: Secondary | ICD-10-CM

## 2021-02-19 DIAGNOSIS — I482 Chronic atrial fibrillation, unspecified: Secondary | ICD-10-CM | POA: Diagnosis not present

## 2021-02-19 LAB — POCT INR: INR: 4.4 — AB (ref 2.0–3.0)

## 2021-02-19 NOTE — Patient Instructions (Signed)
Hold warfarin today and tomorrow then continue taking warfarin 2 tablets (4mg ) daily except 3 tablets (6mg ) on Mondays and Fridays. Recheck INR in 2 weeks. [facility]madin Clinic [phone].

## 2021-02-21 ENCOUNTER — Other Ambulatory Visit: Payer: Self-pay

## 2021-02-21 ENCOUNTER — Ambulatory Visit (HOSPITAL_COMMUNITY): Admission: EM | Admit: 2021-02-21 | Discharge: 2021-02-21 | Payer: Medicare Other

## 2021-02-21 ENCOUNTER — Encounter (HOSPITAL_COMMUNITY): Payer: Self-pay | Admitting: Emergency Medicine

## 2021-02-21 ENCOUNTER — Encounter (HOSPITAL_COMMUNITY): Payer: Self-pay

## 2021-02-21 ENCOUNTER — Emergency Department (HOSPITAL_COMMUNITY)
Admission: EM | Admit: 2021-02-21 | Discharge: 2021-02-21 | Disposition: A | Discharge status: Home / Self Care | Payer: Medicare Other | Attending: Emergency Medicine | Admitting: Emergency Medicine

## 2021-02-21 ENCOUNTER — Emergency Department (HOSPITAL_BASED_OUTPATIENT_CLINIC_OR_DEPARTMENT_OTHER): Payer: Medicare Other

## 2021-02-21 ENCOUNTER — Emergency Department (HOSPITAL_COMMUNITY): Payer: Medicare Other

## 2021-02-21 DIAGNOSIS — R21 Rash and other nonspecific skin eruption: Secondary | ICD-10-CM

## 2021-02-21 DIAGNOSIS — R609 Edema, unspecified: Secondary | ICD-10-CM

## 2021-02-21 DIAGNOSIS — I110 Hypertensive heart disease with heart failure: Secondary | ICD-10-CM | POA: Insufficient documentation

## 2021-02-21 DIAGNOSIS — Z79899 Other long term (current) drug therapy: Secondary | ICD-10-CM | POA: Diagnosis not present

## 2021-02-21 DIAGNOSIS — R001 Bradycardia, unspecified: Secondary | ICD-10-CM | POA: Diagnosis not present

## 2021-02-21 DIAGNOSIS — I639 Cerebral infarction, unspecified: Secondary | ICD-10-CM | POA: Diagnosis not present

## 2021-02-21 DIAGNOSIS — Z7901 Long term (current) use of anticoagulants: Secondary | ICD-10-CM | POA: Insufficient documentation

## 2021-02-21 DIAGNOSIS — M7989 Other specified soft tissue disorders: Secondary | ICD-10-CM

## 2021-02-21 DIAGNOSIS — I4891 Unspecified atrial fibrillation: Secondary | ICD-10-CM | POA: Insufficient documentation

## 2021-02-21 DIAGNOSIS — I2510 Atherosclerotic heart disease of native coronary artery without angina pectoris: Secondary | ICD-10-CM | POA: Insufficient documentation

## 2021-02-21 DIAGNOSIS — I480 Paroxysmal atrial fibrillation: Secondary | ICD-10-CM | POA: Diagnosis not present

## 2021-02-21 DIAGNOSIS — L03115 Cellulitis of right lower limb: Secondary | ICD-10-CM | POA: Diagnosis not present

## 2021-02-21 DIAGNOSIS — Z7982 Long term (current) use of aspirin: Secondary | ICD-10-CM | POA: Insufficient documentation

## 2021-02-21 DIAGNOSIS — Z87891 Personal history of nicotine dependence: Secondary | ICD-10-CM | POA: Diagnosis not present

## 2021-02-21 DIAGNOSIS — J449 Chronic obstructive pulmonary disease, unspecified: Secondary | ICD-10-CM | POA: Insufficient documentation

## 2021-02-21 DIAGNOSIS — I5022 Chronic systolic (congestive) heart failure: Secondary | ICD-10-CM | POA: Diagnosis not present

## 2021-02-21 LAB — BASIC METABOLIC PANEL
Anion gap: 8 (ref 5–15)
BUN: 12 mg/dL (ref 8–23)
CO2: 25 mmol/L (ref 22–32)
Calcium: 8.9 mg/dL (ref 8.9–10.3)
Chloride: 106 mmol/L (ref 98–111)
Creatinine, Ser: 1.06 mg/dL (ref 0.61–1.24)
GFR, Estimated: >60 mL/min (ref >60)
Glucose, Bld: 118 mg/dL — ABNORMAL HIGH (ref 70–99)
Potassium: 4.2 mmol/L (ref 3.5–5.1)
Sodium: 139 mmol/L (ref 135–145)

## 2021-02-21 LAB — CBC
HCT: 43.9 % (ref 39.0–52.0)
Hemoglobin: 13.2 g/dL (ref 13.0–17.0)
MCH: 26.1 pg (ref 26.0–34.0)
MCHC: 30.1 g/dL (ref 30.0–36.0)
MCV: 86.9 fL (ref 80.0–100.0)
Platelets: 308 10*3/uL (ref 150–400)
RBC: 5.05 MIL/uL (ref 4.22–5.81)
RDW: 15 % (ref 11.5–15.5)
WBC: 3.5 10*3/uL — ABNORMAL LOW (ref 4.0–10.5)
nRBC: 0 % (ref 0.0–0.2)

## 2021-02-21 LAB — PROTIME-INR
INR: 2.1 — ABNORMAL HIGH (ref 0.8–1.2)
Prothrombin Time: 23.7 seconds — ABNORMAL HIGH (ref 11.4–15.2)

## 2021-02-21 LAB — BRAIN NATRIURETIC PEPTIDE: B Natriuretic Peptide: 640 pg/mL — ABNORMAL HIGH (ref 0.0–100.0)

## 2021-02-21 MED ORDER — ACETAMINOPHEN 500 MG PO TABS: 1000.0000 mg | ORAL_TABLET | Freq: Four times a day (QID) | ORAL | 0 refills | Status: AC | PRN

## 2021-02-21 MED ORDER — ACETAMINOPHEN 500 MG PO TABS
1000.0000 mg | ORAL_TABLET | Freq: Once | ORAL | Status: AC
  Administered 2021-02-21: 1000 mg via ORAL
  Filled 2021-02-21: qty 2

## 2021-02-21 MED ORDER — DOXYCYCLINE HYCLATE 100 MG PO CAPS: 100.0000 mg | ORAL_CAPSULE | Freq: Two times a day (BID) | ORAL | 0 refills | Status: DC

## 2021-02-21 MED ORDER — DOXYCYCLINE HYCLATE 100 MG PO TABS
100.0000 mg | ORAL_TABLET | Freq: Once | ORAL | Status: AC
  Administered 2021-02-21: 100 mg via ORAL
  Filled 2021-02-21: qty 1

## 2021-02-21 MED ORDER — SODIUM CHLORIDE 0.9 % IV SOLN
1.0000 g | Freq: Once | INTRAVENOUS | Status: AC
  Administered 2021-02-21: 1 g via INTRAVENOUS
  Filled 2021-02-21: qty 10

## 2021-02-21 MED ORDER — MUPIROCIN CALCIUM 2 % EX CREA: 1.0000 "application " | TOPICAL_CREAM | Freq: Two times a day (BID) | CUTANEOUS | 1 refills | Status: DC

## 2021-02-21 NOTE — Progress Notes (Signed)
Bilateral lower extremity venous duplex completed. Refer to "CV Proc" under chart review to view preliminary results.  02/21/2021 4:12 PM Kelby Aline., MHA, RVT, RDCS, RDMS

## 2021-02-21 NOTE — ED Triage Notes (Addendum)
Pt states he was sent from [facility].  Reports weeping lesions to bilateral lower legs and lower leg swelling x 2-3 weeks.  Increased pain and swelling to R leg over the past few days.  [facility] also noted pt to be in uncontrolled afib.  Denies fever and chills.

## 2021-02-21 NOTE — ED Provider Notes (Signed)
[facility] EMERGENCY DEPARTMENT Provider Note   CSN: [id number] Arrival date & time: 02/21/21  1146     History No chief complaint on file.   Vincent [name] is a 85 y.o. male.  HPI Patient is sent from urgent care for concern of possible DVT with recommendation for lower extremity ultrasound.  Patient presented to urgent care because he has chronic rash and lesions on his lower legs.Marland Kitchen  He reports this is been very longstanding.  He reports sometimes the swelling seems to decrease if he picks open some of the blisters that occur.  He reports he did that on the right and then some of the wounds were more painful.  He reports that he is having some pain with walking on the leg.  The right leg is notably more swollen but he denies that he has really noticed a difference.  He denies any fever, chills, nausea or vomiting.  No generalized body aches chest pain or shortness of breath.  Patient is compliant with Coumadin for atrial fibrillation.    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 s/p remote stent to Cx 2004 with chronic stable angina in context of residual circumflex disease.   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/25/2015   NSVT (nonsustained ventricular tachycardia) (HCC)    Permanent atrial fibrillation (HCC)    Pulmonary eosinophilia (HCC)    Sinus bradycardia    Stroke [facility])    Syncope and collapse    Unspecified glaucoma(365.9)      Patient Active Problem List   Diagnosis Date Noted   Chest pain 11/25/2015   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/25/2015   Hemiparesis and alteration of sensations as late effects of stroke ([place]) 09/25/2015   Abnormal stress test 07/21/2015   Foot drop, left 03/11/2015   Gait disorder 03/11/2015   Precordial pain 03/19/2014   Dizziness 03/17/2014   Near syncope 03/17/2014   Encounter for therapeutic drug monitoring 09/24/2013   Rectal bleeding 10/19/2012   External hemorrhoid, bleeding 10/19/2012   Long term current use of anticoagulant 10/12/2010   GLAUCOMA 09/17/2010   GERD 09/17/2010   HEMORRHAGE OF RECTUM AND ANUS 09/17/2010   BRADYCARDIA 06/09/2010   VENTRICULAR TACHYCARDIA 12/30/2008   SYNCOPE 12/30/2008   PULMONARY INFILTRATE INCLUDES (EOSINOPHILIA) 12/28/2007   HLD (hyperlipidemia) 12/27/2007   Essential hypertension 12/27/2007   Coronary atherosclerosis 71/01/2693   Alcoholic cardiomyopathy ([place]) 12/27/2007   PAROXYSMAL ATRIAL FIBRILLATION 12/27/2007   Cerebral artery occlusion with cerebral infarction ([place]) 12/27/2007   COUGH 12/27/2007    Past Surgical History:  Procedure Laterality Date   CARDIAC CATHETERIZATION N/A 07/30/2015   Procedure: Left Heart Cath and Coronary Angiography;  Surgeon: Belva Crome, MD;  Location: [facility] CV [facility];  Service: Cardiovascular;  Laterality: N/A;   COLONOSCOPY     KNEE SURGERY     loop recorder  09/2007       Family History  Problem Relation Age of Onset   Colon cancer Mother    Hypertension Mother    Cancer Mother    Prostate cancer Brother    Cancer Brother    Heart disease Father    Heart attack Father    Hypertension Father    Heart attack Sister    Cancer Sister    Stroke Neg Hx    Colon polyps Neg Hx    Esophageal cancer Neg Hx    Stomach cancer Neg Hx    Rectal cancer Neg Hx    Pancreatic cancer Neg Hx     Social History   Tobacco Use   Smoking status: Former    Pack years: 0.00     Types: Cigarettes    Quit date: 08/22/1983    Years since quitting: 37.5   Smokeless tobacco: Never  Vaping Use   Vaping Use: Never used  Substance Use Topics   Alcohol use: No   Drug use: No    Home Medications Prior to Admission medications   Medication Sig Start Date End Date Taking? Authorizing Provider  acetaminophen (TYLENOL) 500 MG tablet Take 2 tablets (1,000 mg total) by mouth every 6 (six) hours as needed. 02/21/21  Yes [name], MD  doxycycline (VIBRAMYCIN) 100 MG capsule Take 1 capsule (100 mg total) by mouth 2 (two) times daily. One po bid x 7 days 02/21/21  Yes Garvey Westcott, Jeannie Done, MD  mupirocin cream (BACTROBAN) 2 % Apply 1 application topically 2 (two) times daily. Apply ointment twice daily to both interactive wounds on your leg. 02/21/21  Yes Ashawn Rinehart, Jeannie Done, MD  ACCU-CHEK FASTCLIX LANCETS MISC USE TO CHECK BLOOD SUGAR QD 06/05/18   [provider]  ACCU-CHEK GUIDE test strip USE TO CHECK BLOOD SUGAR QD 06/07/18   [provider]  acetaminophen (TYLENOL) 500 MG tablet Take 1 tablet (500 mg total) by mouth every 8 (eight) hours as needed for mild pain or moderate pain. 09/15/19   Zigmund Gottron, NP  aspirin 81 MG chewable tablet Chew 81 mg by mouth daily.    [provider]  atorvastatin (LIPITOR) 40 MG tablet TAKE 1 TABLET(40 MG) BY MOUTH DAILY 11/16/20   [name], MD  brimonidine (ALPHAGAN) 0.2 % ophthalmic solution Place 1 drop into both eyes 2 (two) times daily. 07/30/16   [provider]  calcium-vitamin D (OSCAL WITH D) 250-125 MG-UNIT tablet Take 1 tablet by mouth daily.    [provider]  carvedilol (COREG) 6.25 MG tablet TAKE 1 TABLET(6.25 MG) BY MOUTH TWICE DAILY WITH A MEAL 07/13/20   [name], MD  colchicine 0.6 MG tablet Take 0.6 mg by mouth as needed.     [provider]  Continuous Blood Gluc Sensor (FREESTYLE LIBRE 14 DAY SENSOR) MISC USE AS DIRECTED EVERY 14 DAYS. 03/05/19   [provider]  diclofenac Sodium (VOLTAREN) 1 % GEL Apply 2 g topically 4 (four) times daily. As needed on painful areas of left foot 08/25/20   [name], [name], PA-C  famotidine (PEPCID) 20 MG tablet Take 20 mg by mouth 2 (two) times daily.    [provider]  ferrous sulfate 325 (65 FE) MG tablet Take 325 mg by mouth daily with breakfast.    [provider]  furosemide (LASIX) 20 MG tablet TAKE 1 TABLET(20 MG) BY MOUTH DAILY 08/07/20   [name], MD  hydrocortisone cream 1 % Apply 1  application topically 2 (two) times daily as needed (for eczmea).    [provider]  ipratropium (ATROVENT) 0.06 % nasal spray Place 2 sprays into both nostrils 4 (four) times daily. Patient taking differently: Place 2 sprays into both nostrils as needed. 11/21/16   [name], NP  irbesartan (AVAPRO) 300 MG tablet TAKE 1 TABLET BY MOUTH EVERY NIGHT AT BEDTIME 03/23/20   [name], MD  isosorbide mononitrate (IMDUR) 30 MG 24 hr tablet Take 1 tablet (30 mg total) by mouth daily. 09/25/20   [name], MD  JARDIANCE 25 MG TABS tablet Take 25 mg by mouth daily. 02/04/20   [provider]  latanoprost (XALATAN) 0.005 % ophthalmic solution Place 1 drop into both eyes at bedtime.    [provider]  loratadine (CLARITIN) 10 MG tablet Take 10 mg by mouth daily.    [provider]  magnesium oxide (MAG-OX) 400 MG tablet TAKE 1 TABLET(400 MG) BY MOUTH TWICE DAILY 11/10/20   [name], MD  meclizine (ANTIVERT) 25 MG tablet Take 25 mg by mouth 3 (three) times daily as needed for dizziness.    [provider]  metFORMIN (GLUCOPHAGE) 500 MG tablet Take 500 mg by mouth 2 (two) times daily. 06/16/19   [provider]  Multiple Vitamins-Minerals (MULTIVITAMIN WITH MINERALS) tablet Take 1 tablet by mouth in the morning and at bedtime.    [provider]  nitroGLYCERIN (NITROLINGUAL) 0.4 MG/SPRAY spray  Place 1 spray under the tongue every 5 (five) minutes x 3 doses as needed for chest pain. 12/11/19   [name], MD  Omega-3 Fatty Acids (FISH OIL) 1000 MG CAPS Take 1,000 mg by mouth 2 (two) times daily.     [provider]  ROCKLATAN 0.02-0.005 % SOLN Apply 1 drop to eye at bedtime. 02/03/20   [provider]  spironolactone (ALDACTONE) 25 MG tablet TAKE 1 TABLET(25 MG) BY MOUTH DAILY 09/22/20   [name], MD  Tamsulosin HCl (FLOMAX) 0.4 MG CAPS Take 0.4 mg by mouth daily. 12/28/11   [provider]  traMADol (ULTRAM) 50 MG tablet Take 1 tablet (50 mg total) by mouth 3 (three) times daily as needed. 06/01/20   Volney American, [place] Name: pt reports using gold bond as needed and multiple vitamin supplements.    [provider]  Vibegron (GEMTESA) 75 MG TABS Take by mouth.    [provider]  warfarin (COUMADIN) 2 MG tablet TAKE 2 TO 3 TABLETS BY MOUTH EVERY DAY AS DIRECTED BY COUMADIN CLINIC 01/27/21   Imogene Burn, PA-C    Allergies    Patient has no known allergies.  Review of Systems   Review of Systems 10 systems reviewed and negative except as per HPI Physical Exam Updated Vital Signs BP (!) 145/71 (BP Location: Left Arm)   Pulse 63   Temp 97.8 F (36.6 C)   Resp 20   SpO2 97%   Physical Exam Constitutional:      Comments: Patient is alert in no acute distress.  No respiratory distress.  Mental status clear.  HENT:     Head: Normocephalic and atraumatic.     Mouth/Throat:     Pharynx: Oropharynx is clear.  Cardiovascular:     Comments: Irregularly irregular. Pulmonary:     Effort: Pulmonary effort is normal.     Breath sounds: Normal breath sounds.  Abdominal:     General: There is no  distension.     Palpations: Abdomen is soft.     Tenderness: There is no abdominal tenderness.  Musculoskeletal:     Comments: 2-3+ edema of the right lower extremity.  Diffuse erythema of the pretibial  and lower leg.  Left lower leg does not have significant edema.  Both legs have thinning skin and bullous lesions of various ages.  Several of the lesions on the right lower extremity have crusted eschar is on them.  See attached images.  Skin:    General: Skin is warm and dry.  Neurological:     General: No focal deficit present.     Mental Status: He is oriented to person, place, and time.     Coordination: Coordination normal.  Psychiatric:        Mood and Affect: Mood normal.           ED Results / Procedures / Treatments   Labs (all labs ordered are listed, but only abnormal results are displayed) Labs Reviewed  BASIC METABOLIC PANEL - Abnormal; Notable for the following components:      Result Value   Glucose, Bld 118 (*)    All other components within normal limits  CBC - Abnormal; Notable for the following components:   WBC 3.5 (*)    All other components within normal limits  PROTIME-INR - Abnormal; Notable for the following components:   Prothrombin Time 23.7 (*)    INR 2.1 (*)    All other components within normal limits  BRAIN NATRIURETIC PEPTIDE - Abnormal; Notable for the following components:   B Natriuretic Peptide 640.0 (*)    All other components within normal limits    EKG None  Radiology DG Chest 2 View  Result Date: 02/21/2021 CLINICAL DATA:  Afib and leg swelling x 1 day. Hx of bradycardia, CHF, COPD, CAD, GERD, HTN, stroke, and loop recorder placement. Pt is a former smoker. EXAM: CHEST - 2 VIEW COMPARISON:  10/03/2018 FINDINGS: Cardiac silhouette is mildly enlarged. No mediastinal or hilar masses. No evidence of adenopathy. Lungs are clear.  No pleural effusion or pneumothorax. Skeletal structures are intact. IMPRESSION: No acute cardiopulmonary disease. Electronically Signed   By: Lajean Manes M.D.   On: 02/21/2021 12:33   VAS [place] LOWER EXTREMITY VENOUS (DVT) (ONLY [facility] & [facility] 7a-7p)  Result Date: 02/21/2021  Lower Venous DVT Study Patient Name:   Vincent [name]  Date of Exam:   02/21/2021 Medical Rec #: [id number]       Accession #:    [id number] Date of Birth: Dec 13, 1935        Patient Gender: M Patient Age:   84Y Exam Location:  [facility] Procedure:      VAS [place] LOWER EXTREMITY VENOUS (DVT) Referring Phys: [id number] [facility] Jammal Sarr --------------------------------------------------------------------------------  Indications: Edema.  Limitations: Poor ultrasound/tissue interface. Comparison Study: No prior study Performing Technologist: Maudry Mayhew MHA, RDMS, RVT, RDCS  Examination Guidelines: A complete evaluation includes B-mode imaging, spectral Doppler, color Doppler, and power Doppler as needed of all accessible portions of each vessel. Bilateral testing is considered an integral part of a complete examination. Limited examinations for reoccurring indications may be performed as noted. The reflux portion of the exam is performed with the patient in reverse Trendelenburg.  +---------+---------------+---------+-----------+----------+--------------+ RIGHT    CompressibilityPhasicitySpontaneityPropertiesThrombus Aging +---------+---------------+---------+-----------+----------+--------------+ CFV      Full           Yes      Yes                                 +---------+---------------+---------+-----------+----------+--------------+  SFJ      Full                                                        +---------+---------------+---------+-----------+----------+--------------+ FV Prox  Full                                                        +---------+---------------+---------+-----------+----------+--------------+ FV Mid   Full                                                        +---------+---------------+---------+-----------+----------+--------------+ FV DistalFull                                                        +---------+---------------+---------+-----------+----------+--------------+  PFV      Full                                                        +---------+---------------+---------+-----------+----------+--------------+ POP      Full           Yes      Yes                                 +---------+---------------+---------+-----------+----------+--------------+ PTV      Full                                                        +---------+---------------+---------+-----------+----------+--------------+ PERO     Full                                                        +---------+---------------+---------+-----------+----------+--------------+   +---------+---------------+---------+-----------+----------+--------------+ LEFT     CompressibilityPhasicitySpontaneityPropertiesThrombus Aging +---------+---------------+---------+-----------+----------+--------------+ CFV      Full           Yes      Yes                                 +---------+---------------+---------+-----------+----------+--------------+ SFJ      Full                                                        +---------+---------------+---------+-----------+----------+--------------+  FV Prox  Full                                                        +---------+---------------+---------+-----------+----------+--------------+ FV Mid   Full                                                        +---------+---------------+---------+-----------+----------+--------------+ FV DistalFull                                                        +---------+---------------+---------+-----------+----------+--------------+ PFV      Full                                                        +---------+---------------+---------+-----------+----------+--------------+ POP      Full           Yes      Yes                                 +---------+---------------+---------+-----------+----------+--------------+ PTV      Full                                                         +---------+---------------+---------+-----------+----------+--------------+ PERO     Full                                                        +---------+---------------+---------+-----------+----------+--------------+     Summary: RIGHT: - There is no evidence of deep vein thrombosis in the lower extremity.  - No cystic structure found in the popliteal fossa.  LEFT: - There is no evidence of deep vein thrombosis in the lower extremity.  - No cystic structure found in the popliteal fossa.  *See table(s) above for measurements and observations.    Preliminary     Procedures Procedures   Medications Ordered in ED Medications  cefTRIAXone (ROCEPHIN) 1 g in sodium chloride 0.9 % 100 mL IVPB (0 g Intravenous Stopped 02/21/21 1605)  doxycycline (VIBRA-TABS) tablet 100 mg (100 mg Oral Given 02/21/21 1512)  acetaminophen (TYLENOL) tablet 1,000 mg (1,000 mg Oral Given 02/21/21 1512)    ED Course  I have reviewed the triage vital signs and the nursing notes.  Pertinent labs & imaging results that were available during my care of the patient were reviewed by me and considered in my medical decision making (see chart for details).  Clinical Course as of 02/21/21  [place] Feb 21, 2021  1556 Vascular starting ultrasound now [MP]    Clinical Course User Index [MP] [name], MD   MDM Rules/Calculators/A&P                          Patient is anticoagulated on Coumadin.  He does not have chest pain or shortness of breath.  He is therapeutic on Coumadin.  We will proceed with DVT study for asymmetric lower extremity swelling.  Findings are suggestive of cellulitis with erythema and asymmetric swelling.  There is crusting over some of the lesions.  Apparently he has tried to unroof some of the lesions to help with resolution.  Patient reports chronically been on Keflex for chronic UTI.  Will administer 1 IV dose of Rocephin and start patient on doxycycline and topical  mupirocin.  He does not express significant pain.  Will recommend acetaminophen every 6 hours for pain control and elevating is much as possible.  Patient has mild elevation in BNP but does not appear symptomatic.  Medication list includes 20 mg daily of Lasix. Final Clinical Impression(s) / ED Diagnoses Final diagnoses:  Cellulitis of right lower extremity    Rx / DC Orders ED Discharge Orders          Ordered    doxycycline (VIBRAMYCIN) 100 MG capsule  2 times daily        02/21/21 1750    acetaminophen (TYLENOL) 500 MG tablet  Every 6 hours PRN        02/21/21 1750    mupirocin cream (BACTROBAN) 2 %  2 times daily        02/21/21 1750             [name], MD 02/21/21 1755

## 2021-02-21 NOTE — Discharge Instructions (Addendum)
1.  Start doxycycline twice a day as prescribed.  Many medications interact with your Coumadin.  You need the additional antibiotic right now.  Call your doctor tomorrow to let them know you are started on this medication and you will need very close monitoring of your Coumadin levels. 2.  Apply the antibiotic ointment prescribed to any areas on your leg that have scabbed or open wounds.  Try to elevate your leg is much as possible. 3.  You may take extra strength Tylenol every 6 hours for additional pain control. 4.  Return to the emergency department immediately if you get a fever or feel generally sick or getting increasing redness or other concerning symptoms.

## 2021-02-21 NOTE — ED Notes (Signed)
Patient is being discharged from the Urgent Care and sent to the Emergency Department via personal vehicle . Per Provider Nelwyn Salisbury, patient is in need of higher level of care due to possible DVT. Patient is aware and verbalizes understanding of plan of care.   Vitals:   02/21/21 1101  BP: 99/65  Pulse: 84  Resp: 18  Temp: 97.9 F (36.6 C)  SpO2: 95%

## 2021-02-21 NOTE — Discharge Instructions (Addendum)
Please go directly to the emergency room 

## 2021-02-21 NOTE — ED Provider Notes (Signed)
[name]    CSN: [id number] Arrival date & time: 02/21/21  1015      History   Chief Complaint Chief Complaint  Patient presents with   Rash   Leg Swelling    HPI Vincent [name] is a 85 y.o. male.   HPI  Leg Pain and swelling: Patient states that he has had weeping lesions of his leg for quite a few weeks.  No known cause.  He states that recently over the past few days his right leg has become increasingly swollen and painful.  He is having some difficulty with walking due to the pain.  He is having calf pain but denies any chest pain, shortness of breath, fevers, history of MRSA, dizziness.  He has not tried anything for symptoms.  Of note he does have atrial fibrillation which patient states is normally well controlled.    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 s/p remote stent to Cx 2004 with chronic stable angina in context of residual circumflex disease.   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 (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   Hemiparesis and alteration of sensations as late effects of stroke ([place])  09/25/2015   Abnormal stress test 07/21/2015   Foot drop, left 03/11/2015   Gait disorder 03/11/2015   Precordial pain 03/19/2014   Dizziness 03/17/2014   Near syncope 03/17/2014   Encounter for therapeutic drug monitoring 09/24/2013   Rectal bleeding 10/19/2012   External hemorrhoid, bleeding 10/19/2012   Long term current use of anticoagulant 10/12/2010   GLAUCOMA 09/17/2010   GERD 09/17/2010   HEMORRHAGE OF RECTUM AND ANUS 09/17/2010   BRADYCARDIA 06/09/2010   VENTRICULAR TACHYCARDIA 12/30/2008   SYNCOPE 12/30/2008   PULMONARY INFILTRATE INCLUDES (EOSINOPHILIA) 12/28/2007   HLD (hyperlipidemia) 12/27/2007   Essential hypertension 12/27/2007   Coronary atherosclerosis 15/40/0867   Alcoholic cardiomyopathy ([place]) 12/27/2007   PAROXYSMAL ATRIAL FIBRILLATION 12/27/2007   Cerebral artery occlusion with cerebral infarction ([place]) 12/27/2007   COUGH 12/27/2007    Past Surgical History:  Procedure Laterality Date   CARDIAC CATHETERIZATION N/A 07/30/2015   Procedure: Left Heart Cath and Coronary Angiography;  Surgeon: Belva Crome, MD;  Location: [facility];  Service: Cardiovascular;  Laterality: N/A;   COLONOSCOPY     KNEE SURGERY     loop recorder  09/2007       Home Medications    Prior to Admission medications   Medication Sig Start Date End Date Taking? Authorizing Provider  acetaminophen (TYLENOL)  500 MG tablet Take 1 tablet (500 mg total) by mouth every 8 (eight) hours as needed for mild pain or moderate pain. 09/15/19  Yes Augusto Gamble B, NP  aspirin 81 MG chewable tablet Chew 81 mg by mouth daily.   Yes [provider]  atorvastatin (LIPITOR) 40 MG tablet TAKE 1 TABLET(40 MG) BY MOUTH DAILY 11/16/20  Yes [name], MD  brimonidine (ALPHAGAN) 0.2 % ophthalmic solution Place 1 drop into both eyes 2 (two) times daily. 07/30/16  Yes [provider]  calcium-vitamin D (OSCAL WITH D) 250-125 MG-UNIT tablet Take 1 tablet by mouth daily.   Yes  [provider]  carvedilol (COREG) 6.25 MG tablet TAKE 1 TABLET(6.25 MG) BY MOUTH TWICE DAILY WITH A MEAL 07/13/20  Yes [name], MD  colchicine 0.6 MG tablet Take 0.6 mg by mouth as needed.    Yes [provider]  diclofenac Sodium (VOLTAREN) 1 % GEL Apply 2 g topically 4 (four) times daily. As needed on painful areas of left foot 08/25/20  Yes [name], [name], PA-C  famotidine (PEPCID) 20 MG tablet Take 20 mg by mouth 2 (two) times daily.   Yes [provider]  ferrous sulfate 325 (65 FE) MG tablet Take 325 mg by mouth daily with breakfast.   Yes [provider]  furosemide (LASIX) 20 MG tablet TAKE 1 TABLET(20 MG) BY MOUTH DAILY 08/07/20  Yes [name], MD  ipratropium (ATROVENT) 0.06 % nasal spray Place 2 sprays into both nostrils 4 (four) times daily. Patient taking differently: Place 2 sprays into both nostrils as needed. 11/21/16  Yes [name], NP  irbesartan (AVAPRO) 300 MG tablet TAKE 1 TABLET BY MOUTH EVERY NIGHT AT BEDTIME 03/23/20  Yes [name], MD  isosorbide mononitrate (IMDUR) 30 MG 24 hr tablet Take 1 tablet (30 mg total) by mouth daily. 09/25/20  Yes [name], MD  JARDIANCE 25 MG TABS tablet Take 25 mg by mouth daily. 02/04/20  Yes [provider]  loratadine (CLARITIN) 10 MG tablet Take 10 mg by mouth daily.   Yes [provider]  magnesium oxide (MAG-OX) 400 MG tablet TAKE 1 TABLET(400 MG) BY MOUTH TWICE DAILY 11/10/20  Yes [name], MD  meclizine (ANTIVERT) 25 MG tablet Take 25 mg by mouth 3 (three) times daily as needed for dizziness.   Yes [provider]  metFORMIN (GLUCOPHAGE) 500 MG tablet Take 500 mg by mouth 2 (two) times daily. 06/16/19  Yes [provider]  nitroGLYCERIN (NITROLINGUAL) 0.4 MG/SPRAY spray Place 1 spray under the tongue every 5 (five) minutes x 3 doses as needed for chest pain. 12/11/19  Yes [name], MD   Omega-3 Fatty Acids (FISH OIL) 1000 MG CAPS Take 1,000 mg by mouth 2 (two) times daily.    Yes [provider]  ROCKLATAN 0.02-0.005 % SOLN Apply 1 drop to eye at bedtime. 02/03/20  Yes [provider]  spironolactone (ALDACTONE) 25 MG tablet TAKE 1 TABLET(25 MG) BY MOUTH DAILY 09/22/20  Yes [name], MD  traMADol (ULTRAM) 50 MG tablet Take 1 tablet (50 mg total) by mouth 3 (three) times daily as needed. 06/01/20  Yes Volney American, [place] Name: pt reports using gold bond as needed and multiple vitamin supplements.   Yes [provider]  warfarin (COUMADIN) 2 MG tablet TAKE 2 TO 3 TABLETS BY MOUTH EVERY DAY AS DIRECTED BY COUMADIN CLINIC 01/27/21  Yes Imogene Burn, PA-C  ACCU-CHEK FASTCLIX LANCETS MISC USE TO CHECK BLOOD SUGAR QD 06/05/18   [provider]  ACCU-CHEK GUIDE test strip USE TO CHECK BLOOD SUGAR QD 06/07/18   [provider]  Continuous Blood Gluc Sensor (FREESTYLE LIBRE 14 DAY SENSOR) MISC USE AS DIRECTED EVERY 14 DAYS. 03/05/19   [provider]  hydrocortisone cream 1 % Apply 1 application topically 2 (two) times daily as needed (for eczmea).    [provider]  latanoprost (XALATAN) 0.005 % ophthalmic solution Place 1 drop into both eyes at bedtime.    [provider]  Multiple Vitamins-Minerals (MULTIVITAMIN WITH MINERALS) tablet Take 1 tablet by mouth in the morning and at bedtime.    [provider]  Tamsulosin HCl (FLOMAX) 0.4 MG CAPS Take 0.4 mg by mouth daily. 12/28/11   [provider]  Vibegron (GEMTESA) 75 MG TABS Take by mouth.    [provider]    Family History Family History  Problem Relation Age of Onset   Colon cancer Mother    Hypertension Mother    Cancer Mother    Prostate cancer Brother    Cancer Brother    Heart disease Father    Heart attack Father    Hypertension Father    Heart attack Sister    Cancer Sister    Stroke  Neg Hx    Colon polyps Neg Hx    Esophageal cancer Neg Hx    Stomach cancer Neg Hx    Rectal cancer Neg Hx    Pancreatic cancer Neg Hx     Social History Social History   Tobacco Use   Smoking status: Former    Pack years: 0.00    Types: Cigarettes    Quit date: 08/22/1983    Years since quitting: 37.5   Smokeless tobacco: Never  Vaping Use   Vaping Use: Never used  Substance Use Topics   Alcohol use: No   Drug use: No     Allergies   Patient has no known allergies.   Review of Systems Review of Systems  As stated above in HPI Physical Exam Triage Vital Signs ED Triage Vitals  Enc Vitals Group     BP 02/21/21 1101 99/65     Pulse Rate 02/21/21 1101 84     Resp 02/21/21 1101 18     Temp 02/21/21 1101 97.9 F (36.6 C)     Temp src --      SpO2 02/21/21 1101 95 %     Weight --      Height --      Head Circumference --      Peak Flow --      Pain Score 02/21/21 1055 0     Pain Loc --      Pain Edu? --      Excl. in [place]? --    No data found.  Updated Vital Signs BP 99/65   Pulse 84 Comment: variable heart rate 40s-90s, Jarquez Mestre PA notified  Temp 97.9 F (36.6 C)   Resp 18   SpO2 95%    Physical Exam Vitals and nursing note reviewed.  Constitutional:      General: He is not in acute distress.    Appearance: Normal appearance. He is not ill-appearing, toxic-appearing or diaphoretic.  HENT:     Head: Normocephalic and atraumatic.  Cardiovascular:     Rate and Rhythm: Normal rate. Rhythm irregular.     Pulses: Normal  pulses.     Heart sounds: Normal heart sounds.  Pulmonary:     Effort: Pulmonary effort is normal.     Breath sounds: Normal breath sounds.  Musculoskeletal:     Cervical back: Neck supple.     Comments: Positive Homans sign of the right leg  Skin:    Findings: Erythema and rash present.  Neurological:     General: No focal deficit present.     Mental Status: He is alert.            UC Treatments / Results   Labs (all labs ordered are listed, but only abnormal results are displayed) Labs Reviewed - No data to display  EKG   Radiology No results found.  Procedures Procedures (including critical care time)  Medications Ordered in UC Medications - No data to display  Initial Impression / Assessment and Plan / UC Course  I have reviewed the triage vital signs and the nursing notes.  Pertinent labs & imaging results that were available during my care of the patient were reviewed by me and considered in my medical decision making (see chart for details).     New.  I discussed with patient that this very well could represent cellulitis however given his history, symptoms and physical exam findings I am concerned for a potential DVT and this will need to be ruled out before starting on antibiotics.  In addition he does not seem to be rate controlled today which is abnormal for patient.  He was brought in via private vehicle by a friend and he asked to have the friend take him over to the hospital which I am okay with as he is stable appearing. Final Clinical Impressions(s) / UC Diagnoses   Final diagnoses:  Leg swelling  Rash  Paroxysmal atrial fibrillation [facility])   Discharge Instructions   None    ED Prescriptions   None    PDMP not reviewed this encounter.   Vincent [name], [place] 02/21/21 1139

## 2021-02-21 NOTE — ED Triage Notes (Signed)
Pt reports eczema flare to bilateral lower legs for 2 months with more open areas and increased pain for a couple weeks per pt.   Weeping noted from lower legs with some swelling noted to right lower leg. Reports doesn't always take his lasix if going out.

## 2021-02-21 NOTE — ED Notes (Signed)
US at bedside

## 2021-02-25 DIAGNOSIS — R21 Rash and other nonspecific skin eruption: Secondary | ICD-10-CM | POA: Diagnosis not present

## 2021-02-25 DIAGNOSIS — L0390 Cellulitis, unspecified: Secondary | ICD-10-CM | POA: Diagnosis not present

## 2021-02-25 DIAGNOSIS — L989 Disorder of the skin and subcutaneous tissue, unspecified: Secondary | ICD-10-CM | POA: Diagnosis not present

## 2021-03-02 DIAGNOSIS — I83813 Varicose veins of bilateral lower extremities with pain: Secondary | ICD-10-CM | POA: Diagnosis not present

## 2021-03-03 DIAGNOSIS — N400 Enlarged prostate without lower urinary tract symptoms: Secondary | ICD-10-CM | POA: Diagnosis not present

## 2021-03-03 DIAGNOSIS — E1165 Type 2 diabetes mellitus with hyperglycemia: Secondary | ICD-10-CM | POA: Diagnosis not present

## 2021-03-03 DIAGNOSIS — I10 Essential (primary) hypertension: Secondary | ICD-10-CM | POA: Diagnosis not present

## 2021-03-03 DIAGNOSIS — I509 Heart failure, unspecified: Secondary | ICD-10-CM | POA: Diagnosis not present

## 2021-03-09 ENCOUNTER — Ambulatory Visit: Payer: Medicare Other

## 2021-03-09 ENCOUNTER — Other Ambulatory Visit: Payer: Self-pay

## 2021-03-09 DIAGNOSIS — I6350 Cerebral infarction due to unspecified occlusion or stenosis of unspecified cerebral artery: Secondary | ICD-10-CM | POA: Diagnosis not present

## 2021-03-09 DIAGNOSIS — Z5181 Encounter for therapeutic drug level monitoring: Secondary | ICD-10-CM

## 2021-03-09 DIAGNOSIS — I482 Chronic atrial fibrillation, unspecified: Secondary | ICD-10-CM

## 2021-03-09 LAB — POCT INR: INR: 3.3 — AB (ref 2.0–3.0)

## 2021-03-09 NOTE — Patient Instructions (Signed)
Description   Hold warfarin today, then start taking warfarin 2 tablets (4mg ) daily except 3 tablets (6mg ) on Mondays. Recheck INR in 2 weeks. Coumadin Clinic [phone].

## 2021-03-23 ENCOUNTER — Other Ambulatory Visit: Payer: Self-pay

## 2021-03-23 ENCOUNTER — Ambulatory Visit (INDEPENDENT_AMBULATORY_CARE_PROVIDER_SITE_OTHER): Payer: Medicare Other | Admitting: *Deleted

## 2021-03-23 DIAGNOSIS — Z5181 Encounter for therapeutic drug level monitoring: Secondary | ICD-10-CM

## 2021-03-23 DIAGNOSIS — I6350 Cerebral infarction due to unspecified occlusion or stenosis of unspecified cerebral artery: Secondary | ICD-10-CM

## 2021-03-23 DIAGNOSIS — I482 Chronic atrial fibrillation, unspecified: Secondary | ICD-10-CM | POA: Diagnosis not present

## 2021-03-23 LAB — POCT INR: INR: 2.5 (ref 2.0–3.0)

## 2021-03-23 NOTE — Patient Instructions (Signed)
Description   Continue taking warfarin 2 tablets ('4mg'$ ) daily except 3 tablets ('6mg'$ ) on Mondays. Recheck INR in 4 weeks. [facility]in Clinic [phone].

## 2021-03-31 DIAGNOSIS — I8311 Varicose veins of right lower extremity with inflammation: Secondary | ICD-10-CM | POA: Diagnosis not present

## 2021-04-01 DIAGNOSIS — I10 Essential (primary) hypertension: Secondary | ICD-10-CM | POA: Diagnosis not present

## 2021-04-01 DIAGNOSIS — I509 Heart failure, unspecified: Secondary | ICD-10-CM | POA: Diagnosis not present

## 2021-04-01 DIAGNOSIS — E1165 Type 2 diabetes mellitus with hyperglycemia: Secondary | ICD-10-CM | POA: Diagnosis not present

## 2021-04-01 DIAGNOSIS — N400 Enlarged prostate without lower urinary tract symptoms: Secondary | ICD-10-CM | POA: Diagnosis not present

## 2021-04-07 DIAGNOSIS — I83891 Varicose veins of right lower extremities with other complications: Secondary | ICD-10-CM | POA: Diagnosis not present

## 2021-04-14 DIAGNOSIS — I8311 Varicose veins of right lower extremity with inflammation: Secondary | ICD-10-CM | POA: Diagnosis not present

## 2021-04-20 ENCOUNTER — Other Ambulatory Visit: Payer: Self-pay | Admitting: *Deleted

## 2021-04-20 ENCOUNTER — Other Ambulatory Visit: Payer: Self-pay

## 2021-04-20 ENCOUNTER — Ambulatory Visit: Payer: Medicare Other | Admitting: Pharmacist

## 2021-04-20 DIAGNOSIS — I10 Essential (primary) hypertension: Secondary | ICD-10-CM

## 2021-04-20 DIAGNOSIS — Z5181 Encounter for therapeutic drug level monitoring: Secondary | ICD-10-CM | POA: Diagnosis not present

## 2021-04-20 DIAGNOSIS — I482 Chronic atrial fibrillation, unspecified: Secondary | ICD-10-CM

## 2021-04-20 DIAGNOSIS — E785 Hyperlipidemia, unspecified: Secondary | ICD-10-CM

## 2021-04-20 DIAGNOSIS — Z7901 Long term (current) use of anticoagulants: Secondary | ICD-10-CM

## 2021-04-20 DIAGNOSIS — I6350 Cerebral infarction due to unspecified occlusion or stenosis of unspecified cerebral artery: Secondary | ICD-10-CM

## 2021-04-20 LAB — POCT INR: INR: 2.5 (ref 2.0–3.0)

## 2021-04-20 MED ORDER — NITROGLYCERIN 0.4 MG/SPRAY TL SOLN: 1.0000 | 0 refills | Status: DC | PRN

## 2021-04-20 NOTE — Addendum Note (Signed)
Addended by: Juventino Slovak on: 04/20/2021 02:57 PM   Modules accepted: Orders

## 2021-04-20 NOTE — Patient Instructions (Signed)
Description   Continue taking warfarin 2 tablets ('4mg'$ ) daily except 3 tablets ('6mg'$ ) on Mondays. Recheck INR in 4 weeks. Coumadin Clinic [phone].

## 2021-04-21 DIAGNOSIS — I83813 Varicose veins of bilateral lower extremities with pain: Secondary | ICD-10-CM | POA: Diagnosis not present

## 2021-05-07 DIAGNOSIS — E785 Hyperlipidemia, unspecified: Secondary | ICD-10-CM | POA: Diagnosis not present

## 2021-05-07 DIAGNOSIS — I10 Essential (primary) hypertension: Secondary | ICD-10-CM | POA: Diagnosis not present

## 2021-05-07 DIAGNOSIS — I509 Heart failure, unspecified: Secondary | ICD-10-CM | POA: Diagnosis not present

## 2021-05-07 DIAGNOSIS — E1165 Type 2 diabetes mellitus with hyperglycemia: Secondary | ICD-10-CM | POA: Diagnosis not present

## 2021-05-20 ENCOUNTER — Ambulatory Visit: Payer: Medicare Other | Admitting: *Deleted

## 2021-05-20 ENCOUNTER — Other Ambulatory Visit: Payer: Self-pay

## 2021-05-20 DIAGNOSIS — I482 Chronic atrial fibrillation, unspecified: Secondary | ICD-10-CM | POA: Diagnosis not present

## 2021-05-20 DIAGNOSIS — Z5181 Encounter for therapeutic drug level monitoring: Secondary | ICD-10-CM | POA: Diagnosis not present

## 2021-05-20 DIAGNOSIS — I6350 Cerebral infarction due to unspecified occlusion or stenosis of unspecified cerebral artery: Secondary | ICD-10-CM

## 2021-05-20 LAB — POCT INR: INR: 3.4 — AB (ref 2.0–3.0)

## 2021-05-20 NOTE — Patient Instructions (Signed)
Description   Do not take any Warfarin today then continue taking warfarin 2 tablets (4mg ) daily except 3 tablets (6mg ) on Mondays. Recheck INR in 3 weeks. [facility]in Clinic [phone].

## 2021-05-24 DIAGNOSIS — L97819 Non-pressure chronic ulcer of other part of right lower leg with unspecified severity: Secondary | ICD-10-CM | POA: Diagnosis not present

## 2021-05-24 DIAGNOSIS — I83028 Varicose veins of left lower extremity with ulcer other part of lower leg: Secondary | ICD-10-CM | POA: Diagnosis not present

## 2021-05-24 DIAGNOSIS — I83018 Varicose veins of right lower extremity with ulcer other part of lower leg: Secondary | ICD-10-CM | POA: Diagnosis not present

## 2021-05-24 DIAGNOSIS — L97829 Non-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 leg with unspecified severity: Secondary | ICD-10-CM | POA: Diagnosis not present

## 2021-06-08 DIAGNOSIS — E1165 Type 2 diabetes mellitus with hyperglycemia: Secondary | ICD-10-CM | POA: Diagnosis not present

## 2021-06-08 DIAGNOSIS — E1142 Type 2 diabetes mellitus with diabetic polyneuropathy: Secondary | ICD-10-CM | POA: Diagnosis not present

## 2021-06-08 DIAGNOSIS — I10 Essential (primary) hypertension: Secondary | ICD-10-CM | POA: Diagnosis not present

## 2021-06-08 DIAGNOSIS — E785 Hyperlipidemia, unspecified: Secondary | ICD-10-CM | POA: Diagnosis not present

## 2021-06-09 DIAGNOSIS — I2510 Atherosclerotic heart disease of native coronary artery without angina pectoris: Secondary | ICD-10-CM | POA: Diagnosis not present

## 2021-06-09 DIAGNOSIS — Z0000 Encounter for general adult medical examination without abnormal findings: Secondary | ICD-10-CM | POA: Diagnosis not present

## 2021-06-09 DIAGNOSIS — I509 Heart failure, unspecified: Secondary | ICD-10-CM | POA: Diagnosis not present

## 2021-06-09 DIAGNOSIS — Z1389 Encounter for screening for other disorder: Secondary | ICD-10-CM | POA: Diagnosis not present

## 2021-06-09 DIAGNOSIS — M109 Gout, unspecified: Secondary | ICD-10-CM | POA: Diagnosis not present

## 2021-06-09 DIAGNOSIS — E1142 Type 2 diabetes mellitus with diabetic polyneuropathy: Secondary | ICD-10-CM | POA: Diagnosis not present

## 2021-06-09 DIAGNOSIS — I482 Chronic atrial fibrillation, unspecified: Secondary | ICD-10-CM | POA: Diagnosis not present

## 2021-06-09 DIAGNOSIS — I10 Essential (primary) hypertension: Secondary | ICD-10-CM | POA: Diagnosis not present

## 2021-06-10 ENCOUNTER — Other Ambulatory Visit: Payer: Self-pay

## 2021-06-10 ENCOUNTER — Ambulatory Visit: Payer: Medicare Other | Admitting: *Deleted

## 2021-06-10 DIAGNOSIS — I6350 Cerebral infarction due to unspecified occlusion or stenosis of unspecified cerebral artery: Secondary | ICD-10-CM

## 2021-06-10 DIAGNOSIS — Z5181 Encounter for therapeutic drug level monitoring: Secondary | ICD-10-CM | POA: Diagnosis not present

## 2021-06-10 DIAGNOSIS — I482 Chronic atrial fibrillation, unspecified: Secondary | ICD-10-CM

## 2021-06-10 LAB — POCT INR: INR: 1.7 — AB (ref 2.0–3.0)

## 2021-06-10 NOTE — Patient Instructions (Signed)
Description   Today take 3 tablets then continue taking warfarin 2 tablets (4mg ) daily except 3 tablets (6mg ) on Mondays. Recheck INR in 3 weeks. Coumadin Clinic [phone].

## 2021-06-20 ENCOUNTER — Other Ambulatory Visit: Payer: Self-pay | Admitting: Physician Assistant

## 2021-06-21 NOTE — Telephone Encounter (Signed)
Prescription refill request received for warfarin Lov: 08/05/20 Vincent [name]) Next INR check: 07/01/21 Warfarin tablet strength: 2mg   Appropriate dose and refill sent to requested pharmacy.

## 2021-06-30 ENCOUNTER — Other Ambulatory Visit: Payer: Self-pay

## 2021-06-30 ENCOUNTER — Ambulatory Visit: Payer: Medicare Other

## 2021-06-30 DIAGNOSIS — Z5181 Encounter for therapeutic drug level monitoring: Secondary | ICD-10-CM | POA: Diagnosis not present

## 2021-06-30 DIAGNOSIS — I6350 Cerebral infarction due to unspecified occlusion or stenosis of unspecified cerebral artery: Secondary | ICD-10-CM | POA: Diagnosis not present

## 2021-06-30 DIAGNOSIS — I482 Chronic atrial fibrillation, unspecified: Secondary | ICD-10-CM

## 2021-06-30 LAB — POCT INR: INR: 3.3 — AB (ref 2.0–3.0)

## 2021-06-30 NOTE — Patient Instructions (Signed)
Description   Skip today's dosage of Warfarin, then resume same dosage 2 tablets (4mg ) daily except 3 tablets (6mg ) on Mondays. Recheck INR in 3 weeks. Coumadin Clinic [phone].

## 2021-07-01 DIAGNOSIS — E119 Type 2 diabetes mellitus without complications: Secondary | ICD-10-CM | POA: Diagnosis not present

## 2021-07-01 DIAGNOSIS — H25812 Combined forms of age-related cataract, left eye: Secondary | ICD-10-CM | POA: Diagnosis not present

## 2021-07-01 DIAGNOSIS — H401133 Primary open-angle glaucoma, bilateral, severe stage: Secondary | ICD-10-CM | POA: Diagnosis not present

## 2021-07-01 DIAGNOSIS — H35033 Hypertensive retinopathy, bilateral: Secondary | ICD-10-CM | POA: Diagnosis not present

## 2021-07-09 DIAGNOSIS — E785 Hyperlipidemia, unspecified: Secondary | ICD-10-CM | POA: Diagnosis not present

## 2021-07-09 DIAGNOSIS — I10 Essential (primary) hypertension: Secondary | ICD-10-CM | POA: Diagnosis not present

## 2021-07-09 DIAGNOSIS — E1165 Type 2 diabetes mellitus with hyperglycemia: Secondary | ICD-10-CM | POA: Diagnosis not present

## 2021-07-09 DIAGNOSIS — E1142 Type 2 diabetes mellitus with diabetic polyneuropathy: Secondary | ICD-10-CM | POA: Diagnosis not present

## 2021-07-15 ENCOUNTER — Encounter (HOSPITAL_BASED_OUTPATIENT_CLINIC_OR_DEPARTMENT_OTHER): Payer: Medicare Other | Attending: Internal Medicine | Admitting: Internal Medicine

## 2021-07-15 ENCOUNTER — Other Ambulatory Visit: Payer: Self-pay

## 2021-07-15 DIAGNOSIS — I87333 Chronic venous hypertension (idiopathic) with ulcer and inflammation of bilateral lower extremity: Secondary | ICD-10-CM | POA: Insufficient documentation

## 2021-07-15 DIAGNOSIS — E11622 Type 2 diabetes mellitus with other skin ulcer: Secondary | ICD-10-CM | POA: Insufficient documentation

## 2021-07-15 DIAGNOSIS — L97828 Non-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 leg with other specified severity: Secondary | ICD-10-CM | POA: Diagnosis not present

## 2021-07-15 DIAGNOSIS — L97822 Non-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 leg with fat layer exposed: Secondary | ICD-10-CM | POA: Diagnosis not present

## 2021-07-15 DIAGNOSIS — I4819 Other persistent atrial fibrillation: Secondary | ICD-10-CM | POA: Diagnosis not present

## 2021-07-15 DIAGNOSIS — I110 Hypertensive heart disease with heart failure: Secondary | ICD-10-CM | POA: Insufficient documentation

## 2021-07-15 DIAGNOSIS — L97812 Non-pressure chronic ulcer of other part of right lower leg with fat layer exposed: Secondary | ICD-10-CM | POA: Diagnosis not present

## 2021-07-15 DIAGNOSIS — L97818 Non-pressure chronic ulcer of other part of right lower leg with other specified severity: Secondary | ICD-10-CM | POA: Insufficient documentation

## 2021-07-15 DIAGNOSIS — Z950 Presence of cardiac pacemaker: Secondary | ICD-10-CM | POA: Insufficient documentation

## 2021-07-15 DIAGNOSIS — E1140 Type 2 diabetes mellitus with diabetic neuropathy, unspecified: Secondary | ICD-10-CM | POA: Insufficient documentation

## 2021-07-15 DIAGNOSIS — I509 Heart failure, unspecified: Secondary | ICD-10-CM | POA: Insufficient documentation

## 2021-07-15 DIAGNOSIS — E1151 Type 2 diabetes mellitus with diabetic peripheral angiopathy without gangrene: Secondary | ICD-10-CM | POA: Diagnosis not present

## 2021-07-15 DIAGNOSIS — E1136 Type 2 diabetes mellitus with diabetic cataract: Secondary | ICD-10-CM | POA: Insufficient documentation

## 2021-07-15 DIAGNOSIS — Z7901 Long term (current) use of anticoagulants: Secondary | ICD-10-CM | POA: Insufficient documentation

## 2021-07-15 DIAGNOSIS — Z8673 Personal history of transient ischemic attack (TIA), and cerebral infarction without residual deficits: Secondary | ICD-10-CM | POA: Insufficient documentation

## 2021-07-15 NOTE — Progress Notes (Signed)
Roggenkamp, Kingsly M. ([id number]) Visit Report for 07/15/2021 Abuse/Suicide Risk Screen Details Patient Name: Date of Service: [name] ID M. 07/15/2021 1:15 PM Medical Record Number: [id number] Patient Account Number: [id number] Date of Birth/Sex: Treating RN: [birth year] (85 y.o. [name] Primary Care Caitlinn Klinker: [name] Other Clinician: Referring Deepika Decatur: Treating Geryl Dohn/Extender: Bryon Lions in Treatment: 0 Abuse/Suicide Risk Screen Items Answer ABUSE RISK SCREEN: Has anyone close to you tried to hurt or harm you recentlyo No Do you feel uncomfortable with anyone in your familyo No Has anyone forced you do things that you didnt want to doo No Electronic Signature(s) Signed: 07/15/2021 5:41:09 PM By: Baruch Gouty RN, BSN Entered By: Baruch Gouty on 07/15/2021 14:25:59 -------------------------------------------------------------------------------- Activities of Daily Living Details Patient Name: Date of Service: Larey Days ID M. 07/15/2021 1:15 PM Medical Record Number: [id number] Patient Account Number: [id number] Date of Birth/Sex: Treating RN: [birth year] (85 y.o. [name] Primary Care Riyansh Gerstner: [name] Other Clinician: Referring Jahdai Padovano: Treating Allesandra Huebsch/Extender: Bryon Lions in Treatment: 0 Activities of Daily Living Items Answer Activities of Daily Living (Please select one for each item) Drive Automobile Not Able T Medications ake Completely Able Use T elephone Completely Able Care for Appearance Completely Able Use T oilet Completely Able Bath / Shower Completely Able Dress Self Completely Able Feed Self Completely Able Walk Completely Able Get In / Out Bed Completely Able Housework Completely Able Prepare Meals Completely Able Handle Money Completely Able Shop for Self Completely Able Electronic Signature(s) Signed: 07/15/2021 5:41:09 PM By: Baruch Gouty RN,  BSN Entered By: Baruch Gouty on 07/15/2021 14:26:25 -------------------------------------------------------------------------------- Education Screening Details Patient Name: Date of Service: [name], [name] 07/15/2021 1:15 PM Medical Record Number: [id number] Patient Account Number: [id number] Date of Birth/Sex: Treating RN: [birth year] (85 y.o. [name] Primary Care Kaija Kovacevic: [name] Other Clinician: Referring Temprance Wyre: Treating Jamecia Lerman/Extender: Bryon Lions in Treatment: 0 Primary Learner Assessed: Patient Learning Preferences/Education Level/Primary Language Learning Preference: Explanation, Demonstration, Printed Material Highest Education Level: College or Above Preferred Language: English Cognitive Barrier Language Barrier: No Translator Needed: No Memory Deficit: No Emotional Barrier: No Cultural/Religious Beliefs Affecting Medical Care: No Physical Barrier Impaired Vision: Yes Glasses Impaired Hearing: No Decreased Hand dexterity: No Knowledge/Comprehension Knowledge Level: High Comprehension Level: High Ability to understand written instructions: High Ability to understand verbal instructions: High Motivation Anxiety Level: Calm Cooperation: Cooperative Education Importance: Acknowledges Need Interest in Health Problems: Asks Questions Perception: Coherent Willingness to Engage in Self-Management High Activities: Readiness to Engage in Self-Management High Activities: Electronic Signature(s) Signed: 07/15/2021 5:41:09 PM By: Baruch Gouty RN, BSN Entered By: Baruch Gouty on 07/15/2021 14:27:02 -------------------------------------------------------------------------------- Fall Risk Assessment Details Patient Name: Date of Service: [name], [name] 07/15/2021 1:15 PM Medical Record Number: [id number] Patient Account Number: [id number] Date of Birth/Sex: Treating RN: Dec 04, 1935 (85 y.o. [name] Primary Care Tuana Hoheisel: [name] Other Clinician: Referring Akire Rennert: Treating Annalese Stiner/Extender: Bryon Lions in Treatment: 0 Fall Risk Assessment Items Have you had 2 or more falls in the last 12 monthso 0 No Have you had any fall that resulted in injury in the last 12 monthso 0 No FALLS RISK SCREEN History of falling - immediate or within 3 months 0 No Secondary diagnosis (Do you have 2 or more medical diagnoseso) 0 No Ambulatory aid None/bed rest/wheelchair/nurse 0 Yes Crutches/cane/walker 0 No Furniture 0 No Intravenous therapy Access/Saline/Heparin Lock 0 No Gait/Transferring  Normal/ bed rest/ wheelchair 0 Yes Weak (short steps with or without shuffle, stooped but able to lift head while walking, may seek 0 No support from furniture) Impaired (short steps with shuffle, may have difficulty arising from chair, head down, impaired 0 No balance) Mental Status Oriented to own ability 0 Yes Electronic Signature(s) Signed: 07/15/2021 5:41:09 PM By: Baruch Gouty RN, BSN Entered By: Baruch Gouty on 07/15/2021 14:27:16 -------------------------------------------------------------------------------- Foot Assessment Details Patient Name: Date of Service: [name], [name] 07/15/2021 1:15 PM Medical Record Number: [id number] Patient Account Number: [id number] Date of Birth/Sex: Treating RN: Mar 19, 1936 (85 y.o. [name] Primary Care Dinara Lupu: [name] Other Clinician: Referring Avni Traore: Treating Brynne Doane/Extender: Bryon Lions in Treatment: 0 Foot Assessment Items Site Locations + = Sensation present, - = Sensation absent, C = Callus, U = Ulcer R = Redness, W = Warmth, M = Maceration, PU = Pre-ulcerative lesion F = Fissure, S = Swelling, D = Dryness Assessment Right: Left: Other Deformity: No No Prior Foot Ulcer: No No Prior Amputation: No No Charcot Joint: No No Ambulatory Status:  Ambulatory Without Help Gait: Steady Electronic Signature(s) Signed: 07/15/2021 5:41:09 PM By: Baruch Gouty RN, BSN Entered By: Baruch Gouty on 07/15/2021 14:30:03 -------------------------------------------------------------------------------- Nutrition Risk Screening Details Patient Name: Date of Service: [name], Shaune Pascal ID M. 07/15/2021 1:15 PM Medical Record Number: [id number] Patient Account Number: [id number] Date of Birth/Sex: Treating RN: Dec 02, 1935 (85 y.o. [name] Primary Care Khalib Fendley: [name] Other Clinician: Referring Thaddius Manes: Treating Taijon Vink/Extender: Lajean Silvius, Fransisca Connors in Treatment: 0 Height (in): 69 Weight (lbs): 185 Body Mass Index (BMI): 27.3 Nutrition Risk Screening Items Score Screening NUTRITION RISK SCREEN: I have an illness or condition that made me change the kind and/or amount of food I eat 0 No I eat fewer than two meals per day 0 No I eat few fruits and vegetables, or milk products 0 No I have three or more drinks of beer, liquor or wine almost every day 0 No I have tooth or mouth problems that make it hard for me to eat 0 No I don't always have enough money to buy the food I need 0 No I eat alone most of the time 1 Yes I take three or more different prescribed or over-the-counter drugs a day 1 Yes Without wanting to, I have lost or gained 10 pounds in the last six months 0 No I am not always physically able to shop, cook and/or feed myself 0 No Nutrition Protocols Good Risk Protocol 0 No interventions needed Moderate Risk Protocol High Risk Proctocol Risk Level: Good Risk Score: 2 Electronic Signature(s) Signed: 07/15/2021 5:41:09 PM By: Baruch Gouty RN, BSN Entered By: Baruch Gouty on 07/15/2021 14:27:56

## 2021-07-15 NOTE — Progress Notes (Signed)
Kobayashi, Wayburn M. ([id number]) Visit Report for 07/15/2021 Allergy List Details Patient Name: Date of Service: [name] ID M. 07/15/2021 1:15 PM Medical Record Number: [id number] Patient Account Number: [id number] Date of Birth/Sex: Treating RN: June 29, 1936 (85 y.o. [name] Primary Care Alyra Patty: [name] Other Clinician: Referring Elisse Pennick: Treating Alysen Smylie/Extender: Bryon Lions in Treatment: 0 Allergies Active Allergies No Known Allergies Allergy Notes Electronic Signature(s) Signed: 07/15/2021 5:41:09 PM By: Baruch Gouty RN, BSN Entered By: Baruch Gouty on 07/15/2021 14:15:54 -------------------------------------------------------------------------------- Arrival Information Details Patient Name: Date of Service: [name], [name] 07/15/2021 1:15 PM Medical Record Number: [id number] Patient Account Number: [id number] Date of Birth/Sex: Treating RN: Nov 26, 1935 (85 y.o. [name] Primary Care Lorik Guo: [name] Other Clinician: Referring Refugia Laneve: Treating Aloni Chuang/Extender: Bryon Lions in Treatment: 0 Visit Information Patient Arrived: Ambulatory Arrival Time: 14:04 Accompanied By: self Transfer Assistance: None Patient Identification Verified: Yes Secondary Verification Process Completed: Yes Patient Requires Transmission-Based Precautions: No Patient Has Alerts: Yes Patient Alerts: Patient on Blood Thinner R ABI = 1.43 TBI= .39 L ABI = 1.85 TBI= .6 Electronic Signature(s) Signed: 07/15/2021 5:41:09 PM By: Baruch Gouty RN, BSN Entered By: Baruch Gouty on 07/15/2021 14:12:12 -------------------------------------------------------------------------------- Clinic Level of Care Assessment Details Patient Name: Date of Service: [name], Shaune Pascal ID M. 07/15/2021 1:15 PM Medical Record Number: [id number] Patient Account Number: [id number] Date of Birth/Sex: Treating RN: 1936/02/08  (85 y.o. [name] Primary Care Kennon Encinas: [name] Other Clinician: Referring Irini Leet: Treating Keeshawn Fakhouri/Extender: Bryon Lions in Treatment: 0 Clinic Level of Care Assessment Items TOOL 2 Quantity Score []  - 0 Use when only an EandM is performed on the INITIAL visit ASSESSMENTS - Nursing Assessment / Reassessment X- 1 20 General Physical Exam (combine w/ comprehensive assessment (listed just below) when performed on new pt. evals) X- 1 25 Comprehensive Assessment (HX, ROS, Risk Assessments, Wounds Hx, etc.) ASSESSMENTS - Wound and Skin A ssessment / Reassessment []  - 0 Simple Wound Assessment / Reassessment - one wound X- 5 5 Complex Wound Assessment / Reassessment - multiple wounds []  - 0 Dermatologic / Skin Assessment (not related to wound area) ASSESSMENTS - Ostomy and/or Continence Assessment and Care []  - 0 Incontinence Assessment and Management []  - 0 Ostomy Care Assessment and Management (repouching, etc.) PROCESS - Coordination of Care X - Simple Patient / Family Education for ongoing care 1 15 []  - 0 Complex (extensive) Patient / Family Education for ongoing care X- 1 10 Staff obtains Programmer, systems, Records, T Results / Process Orders est []  - 0 Staff telephones HHA, Nursing Homes / Clarify orders / etc []  - 0 Routine Transfer to another Facility (non-emergent condition) []  - 0 Routine Hospital Admission (non-emergent condition) X- 1 15 New Admissions / Biomedical engineer / Ordering NPWT Apligraf, etc. , []  - 0 Emergency Hospital Admission (emergent condition) X- 1 10 Simple Discharge Coordination []  - 0 Complex (extensive) Discharge Coordination PROCESS - Special Needs []  - 0 Pediatric / Minor Patient Management []  - 0 Isolation Patient Management []  - 0 Hearing / Language / Visual special needs []  - 0 Assessment of Community assistance (transportation, D/C planning, etc.) []  - 0 Additional assistance /  Altered mentation []  - 0 Support Surface(s) Assessment (bed, cushion, seat, etc.) INTERVENTIONS - Wound Cleansing / Measurement X- 1 5 Wound Imaging (photographs - any number of wounds) []  - 0 Wound Tracing (instead of photographs) X- 1 5 Simple Wound Measurement -  one wound []  - 0 Complex Wound Measurement - multiple wounds X- 1 5 Simple Wound Cleansing - one wound []  - 0 Complex Wound Cleansing - multiple wounds INTERVENTIONS - Wound Dressings X - Small Wound Dressing one or multiple wounds 1 10 []  - 0 Medium Wound Dressing one or multiple wounds []  - 0 Large Wound Dressing one or multiple wounds []  - 0 Application of Medications - injection INTERVENTIONS - Miscellaneous []  - 0 External ear exam []  - 0 Specimen Collection (cultures, biopsies, blood, body fluids, etc.) []  - 0 Specimen(s) / Culture(s) sent or taken to Lab for analysis []  - 0 Patient Transfer (multiple staff / Civil Service fast streamer / Similar devices) []  - 0 Simple Staple / Suture removal (25 or less) []  - 0 Complex Staple / Suture removal (26 or more) []  - 0 Hypo / Hyperglycemic Management (close monitor of Blood Glucose) []  - 0 Ankle / Brachial Index (ABI) - do not check if billed separately Has the patient been seen at the hospital within the last three years: Yes Total Score: 145 Level Of Care: New/Established - Level 4 Electronic Signature(s) Signed: 07/15/2021 5:41:09 PM By: Baruch Gouty RN, BSN Entered By: Baruch Gouty on 07/15/2021 17:22:55 -------------------------------------------------------------------------------- Encounter Discharge Information Details Patient Name: Date of Service: [name], [name] 07/15/2021 1:15 PM Medical Record Number: [id number] Patient Account Number: [id number] Date of Birth/Sex: Treating RN: [birth year] (85 y.o. [name] Primary Care Antawn Sison: [name] Other Clinician: Referring Shavawn Stobaugh: Treating Bernabe Dorce/Extender: Bryon Lions in Treatment: 0 Encounter Discharge Information Items Discharge Condition: Stable Ambulatory Status: Ambulatory Discharge Destination: Home Transportation: Private Auto Accompanied By: sister Schedule Follow-up Appointment: Yes Clinical Summary of Care: Patient Declined Electronic Signature(s) Signed: 07/15/2021 5:41:09 PM By: Baruch Gouty RN, BSN Entered By: Baruch Gouty on 07/15/2021 15:29:48 -------------------------------------------------------------------------------- Lower Extremity Assessment Details Patient Name: Date of Service: [name], [name] 07/15/2021 1:15 PM Medical Record Number: [id number] Patient Account Number: [id number] Date of Birth/Sex: Treating RN: 1935-09-20 (85 y.o. [name] Primary Care Arie Gable: [name] Other Clinician: Referring Jamarco Zaldivar: Treating Francenia Chimenti/Extender: Bryon Lions in Treatment: 0 Edema Assessment Assessed: Shirlyn Goltz: No] [Right: No] E[Left: dema] [Right: :] Calf Left: Right: Point of Measurement: From Medial Instep 38.5 cm 38.8 cm Ankle Left: Right: Point of Measurement: From Medial Instep 25 cm 23.5 cm Vascular Assessment Pulses: Dorsalis Pedis Palpable: [Left:Yes] [Right:No] Electronic Signature(s) Signed: 07/15/2021 5:41:09 PM By: Baruch Gouty RN, BSN Entered By: Baruch Gouty on 07/15/2021 14:32:51 -------------------------------------------------------------------------------- Multi Wound Chart Details Patient Name: Date of Service: [name], [name] 07/15/2021 1:15 PM Medical Record Number: [id number] Patient Account Number: [id number] Date of Birth/Sex: Treating RN: [birth year] (85 y.o. [name], [name] Primary Care Reyne Falconi: [name] Other Clinician: Referring Kalista Laguardia: Treating Maleiah Dula/Extender: Lajean Silvius, Fransisca Connors in Treatment: 0 Vital Signs Height(in): 53 Capillary Blood Glucose(mg/dl): 120 Weight(lbs): 185 Pulse(bpm):  42 Body Mass Index(BMI): 27 Blood Pressure(mmHg): 124/75 Temperature(F): 98.3 Respiratory Rate(breaths/min): 18 Photos: Right, Anterior Lower Leg Right, Proximal, Anterior Lower Leg Right, Lateral Lower Leg Wound Location: Gradually Appeared Blister Blister Wounding Event: Diabetic Wound/Ulcer of the Lower Diabetic Wound/Ulcer of the Lower Diabetic Wound/Ulcer of the Lower Primary Etiology: Extremity Extremity Extremity Venous Leg Ulcer Venous Leg Ulcer Venous Leg Ulcer Secondary Etiology: Cataracts, Glaucoma, Chronic Cataracts, Glaucoma, Chronic Cataracts, Glaucoma, Chronic Comorbid History: Obstructive Pulmonary Disease Obstructive Pulmonary Disease Obstructive Pulmonary Disease (COPD), Arrhythmia, Congestive (COPD), Arrhythmia, Congestive (COPD), Arrhythmia, Congestive Heart Failure,  Coronary Artery Heart Failure, Coronary Artery Heart Failure, Coronary Artery Disease, Hypertension, Type II Disease, Hypertension, Type II Disease, Hypertension, Type II Diabetes, Gout, Neuropathy Diabetes, Gout, Neuropathy Diabetes, Gout, Neuropathy 04/29/2021 04/29/2021 04/29/2021 Date Acquired: 0 0 0 Weeks of Treatment: Open Open Open Wound Status: 3.5x2.4x0.1 0.6x0.6x0.1 1.3x1.3x0.1 Measurements L x W x D (cm) 6.597 0.283 1.327 A (cm) : rea 0.66 0.028 0.133 Volume (cm) : 0.00% 0.00% 0.00% % Reduction in A rea: 0.00% 0.00% 0.00% % Reduction in Volume: Grade 1 Grade 1 Grade 1 Classification: Medium Medium Medium Exudate A mount: Serosanguineous Serous Serous Exudate Type: red, brown amber amber Exudate Color: Flat and Intact Flat and Intact Flat and Intact Wound Margin: Medium (34-66%) Large (67-100%) Large (67-100%) Granulation A mount: Red Red Red Granulation Quality: Medium (34-66%) None Present (0%) None Present (0%) Necrotic A mount: Fat Layer (Subcutaneous Tissue): Yes Fat Layer (Subcutaneous Tissue): Yes Fat Layer (Subcutaneous Tissue): Yes Exposed Structures: Fascia:  No Fascia: No Fascia: No Tendon: No Tendon: No Tendon: No Muscle: No Muscle: No Muscle: No Joint: No Joint: No Joint: No Bone: No Bone: No Bone: No Small (1-33%) Small (1-33%) Small (1-33%) Epithelialization: Wound Number: 4 5 N/A Photos: N/A Left, Anterior Lower Leg Left, Lateral Lower Leg N/A Wound Location: Blister Blister N/A Wounding Event: Diabetic Wound/Ulcer of the Lower Diabetic Wound/Ulcer of the Lower N/A Primary Etiology: Extremity Extremity Venous Leg Ulcer Venous Leg Ulcer N/A Secondary Etiology: Cataracts, Glaucoma, Chronic Cataracts, Glaucoma, Chronic N/A Comorbid History: Obstructive Pulmonary Disease Obstructive Pulmonary Disease (COPD), Arrhythmia, Congestive (COPD), Arrhythmia, Congestive Heart Failure, Coronary Artery Heart Failure, Coronary Artery Disease, Hypertension, Type II Disease, Hypertension, Type II Diabetes, Gout, Neuropathy Diabetes, Gout, Neuropathy 04/29/2021 04/29/2021 N/A Date Acquired: 0 0 N/A Weeks of Treatment: Open Open N/A Wound Status: 1x0.8x0.1 0.9x1.4x0.1 N/A Measurements L x W x D (cm) 0.628 0.99 N/A A (cm) : rea 0.063 0.099 N/A Volume (cm) : 0.00% 0.00% N/A % Reduction in A rea: 0.00% 0.00% N/A % Reduction in Volume: Grade 1 Grade 1 N/A Classification: Medium Medium N/A Exudate A mount: Serous Serosanguineous N/A Exudate Type: amber red, brown N/A Exudate Color: Flat and Intact Flat and Intact N/A Wound Margin: Large (67-100%) Large (67-100%) N/A Granulation A mount: Red Red N/A Granulation Quality: None Present (0%) Small (1-33%) N/A Necrotic A mount: Fat Layer (Subcutaneous Tissue): Yes Fat Layer (Subcutaneous Tissue): Yes N/A Exposed Structures: Fascia: No Fascia: No Tendon: No Tendon: No Muscle: No Muscle: No Joint: No Joint: No Bone: No Bone: No Medium (34-66%) Small (1-33%) N/A Epithelialization: Treatment Notes Electronic Signature(s) Signed: 07/15/2021 5:16:34 PM By: Linton Ham MD Signed: 07/15/2021 5:18:51 PM By: Deon Pilling RN, BSN Entered By: Linton Ham on 07/15/2021 15:19:48 -------------------------------------------------------------------------------- Multi-Disciplinary Care Plan Details Patient Name: Date of Service: [name], [name] 07/15/2021 1:15 PM Medical Record Number: [id number] Patient Account Number: [id number] Date of Birth/Sex: Treating RN: 10-Sep-1935 (85 y.o. [name] Primary Care Roux Brandy: [name] Other Clinician: Referring Koralyn Prestage: Treating Campbell Agramonte/Extender: Bryon Lions in Treatment: 0 Multidisciplinary Care Plan reviewed with physician Active Inactive Nutrition Nursing Diagnoses: Impaired glucose control: actual or potential Potential for alteratiion in Nutrition/Potential for imbalanced nutrition Goals: Patient/caregiver will maintain therapeutic glucose control Date Initiated: 07/15/2021 Target Resolution Date: 08/12/2021 Goal Status: Active Interventions: Assess HgA1c results as ordered upon admission and as needed Assess patient nutrition upon admission and as needed per policy Provide education on elevated blood sugars and impact on wound healing Treatment Activities: Dietary  management education, guidance and counseling : 07/15/2021 Giving encouragement to exercise : 07/15/2021 Patient referred to Primary Care Physician for further nutritional evaluation : 07/15/2021 Notes: Venous Leg Ulcer Nursing Diagnoses: Knowledge deficit related to disease process and management Potential for venous Insuffiency (use before diagnosis confirmed) Goals: Patient will maintain optimal edema control Date Initiated: 07/15/2021 Target Resolution Date: 08/12/2021 Goal Status: Active Interventions: Assess peripheral edema status every visit. Compression as ordered Provide education on venous insufficiency Treatment Activities: Therapeutic compression applied :  07/15/2021 Notes: Wound/Skin Impairment Nursing Diagnoses: Impaired tissue integrity Knowledge deficit related to ulceration/compromised skin integrity Goals: Patient/caregiver will verbalize understanding of skin care regimen Date Initiated: 07/15/2021 Target Resolution Date: 08/12/2021 Goal Status: Active Ulcer/skin breakdown will have a volume reduction of 30% by week 4 Date Initiated: 07/15/2021 Target Resolution Date: 08/12/2021 Goal Status: Active Interventions: Assess patient/caregiver ability to obtain necessary supplies Assess patient/caregiver ability to perform ulcer/skin care regimen upon admission and as needed Assess ulceration(s) every visit Provide education on ulcer and skin care Treatment Activities: Skin care regimen initiated : 07/15/2021 Topical wound management initiated : 07/15/2021 Notes: Electronic Signature(s) Signed: 07/15/2021 5:41:09 PM By: Baruch Gouty RN, BSN Entered By: Baruch Gouty on 07/15/2021 14:58:31 -------------------------------------------------------------------------------- Pain Assessment Details Patient Name: Date of Service: [name], [name] 07/15/2021 1:15 PM Medical Record Number: [id number] Patient Account Number: [id number] Date of Birth/Sex: Treating RN: May 24, 1936 (85 y.o. [name] Primary Care Kiannah Grunow: [name] Other Clinician: Referring Nicco Reaume: Treating Venissa Nappi/Extender: Bryon Lions in Treatment: 0 Active Problems Location of Pain Severity and Description of Pain Patient Has Paino Yes Site Locations Pain Location: Pain in Ulcers With Dressing Change: No Duration of the Pain. Constant / Intermittento Intermittent Rate the pain. Current Pain Level: 0 Worst Pain Level: 6 Least Pain Level: 0 Character of Pain Describe the Pain: Other: stinging Pain Management and Medication Current Pain Management: Other: tolerable Is the Current Pain Management Adequate:  Adequate How does your wound impact your activities of daily livingo Sleep: No Bathing: No Appetite: No Relationship With Others: No Bladder Continence: No Emotions: No Bowel Continence: No Work: No Toileting: No Drive: No Dressing: No Hobbies: No Electronic Signature(s) Signed: 07/15/2021 5:41:09 PM By: Baruch Gouty RN, BSN Entered By: Baruch Gouty on 07/15/2021 14:48:42 -------------------------------------------------------------------------------- Patient/Caregiver Education Details Patient Name: Date of Service: [name] ID M. 11/17/2022andnbsp1:15 PM Medical Record Number: [id number] Patient Account Number: [id number] Date of Birth/Gender: Treating RN: [birth year] (85 y.o. [name] Primary Care Physician: [name] Other Clinician: Referring Physician: Treating Physician/Extender: Bryon Lions in Treatment: 0 Education Assessment Education Provided To: Patient Education Topics Provided Elevated Blood Sugar/ Impact on Healing: Methods: Explain/Verbal Responses: Reinforcements needed, State content correctly Venous: Methods: Explain/Verbal Responses: Reinforcements needed, State content correctly Wound/Skin Impairment: Methods: Explain/Verbal Responses: Reinforcements needed, State content correctly Electronic Signature(s) Signed: 07/15/2021 5:41:09 PM By: Baruch Gouty RN, BSN Entered By: Baruch Gouty on 07/15/2021 17:20:21 -------------------------------------------------------------------------------- Wound Assessment Details Patient Name: Date of Service: [name], [name] 07/15/2021 1:15 PM Medical Record Number: [id number] Patient Account Number: [id number] Date of Birth/Sex: Treating RN: [birth year] (85 y.o. [name] Primary Care Sammantha Mehlhaff: [name] Other Clinician: Referring Jacobo Moncrief: Treating Nely Dedmon/Extender: Bryon Lions in Treatment: 0 Wound  Status Wound Number: 1 Primary Diabetic Wound/Ulcer of the Lower Extremity Etiology: Wound Location: Right, Anterior Lower Leg Secondary Venous Leg Ulcer Wounding Event: Gradually Appeared Etiology: Date Acquired: 04/29/2021 Wound Open Weeks Of  Treatment: 0 Status: Clustered Wound: No Comorbid Cataracts, Glaucoma, Chronic Obstructive Pulmonary Disease History: (COPD), Arrhythmia, Congestive Heart Failure, Coronary Artery Disease, Hypertension, Type II Diabetes, Gout, Neuropathy Photos Wound Measurements Length: (cm) 3.5 Width: (cm) 2.4 Depth: (cm) 0.1 Area: (cm) 6.597 Volume: (cm) 0.66 % Reduction in Area: 0% % Reduction in Volume: 0% Epithelialization: Small (1-33%) Tunneling: No Undermining: No Wound Description Classification: Grade 1 Wound Margin: Flat and Intact Exudate Amount: Medium Exudate Type: Serosanguineous Exudate Color: red, brown Foul Odor After Cleansing: No Slough/Fibrino Yes Wound Bed Granulation Amount: Medium (34-66%) Exposed Structure Granulation Quality: Red Fascia Exposed: No Necrotic Amount: Medium (34-66%) Fat Layer (Subcutaneous Tissue) Exposed: Yes Necrotic Quality: Adherent Slough Tendon Exposed: No Muscle Exposed: No Joint Exposed: No Bone Exposed: No Treatment Notes Wound #1 (Lower Leg) Wound Laterality: Right, Anterior Cleanser Peri-Wound Care Triamcinolone 15 (g) Discharge Instruction: Use triamcinolone 15 (g) mixed with lotion to lower legs Sween Lotion (Moisturizing lotion) Discharge Instruction: Apply moisturizing lotion as directed Topical Primary Dressing KerraCel Ag Gelling Fiber Dressing, 2x2 in (silver alginate) Discharge Instruction: Apply silver alginate to wound bed as instructed Secondary Dressing Woven Gauze Sponge, Non-Sterile 4x4 in Discharge Instruction: Apply over primary dressing as directed. Secured With Compression Wrap Kerlix Roll 4.5x3.1 (in/yd) Discharge Instruction: Apply Kerlix and Coban  compression as directed. Coban Self-Adherent Wrap 4x5 (in/yd) Discharge Instruction: Apply over Kerlix as directed. Compression Stockings Add-Ons Electronic Signature(s) Signed: 07/15/2021 4:04:47 PM By: Sandre Kitty Signed: 07/15/2021 5:41:09 PM By: Baruch Gouty RN, BSN Entered By: Sandre Kitty on 07/15/2021 14:48:15 -------------------------------------------------------------------------------- Wound Assessment Details Patient Name: Date of Service: [name], [name] 07/15/2021 1:15 PM Medical Record Number: [id number] Patient Account Number: [id number] Date of Birth/Sex: Treating RN: 1936/06/06 (85 y.o. [name] Primary Care Marlane Hirschmann: [name] Other Clinician: Referring Qiana Landgrebe: Treating Joyelle Siedlecki/Extender: Bryon Lions in Treatment: 0 Wound Status Wound Number: 2 Primary Diabetic Wound/Ulcer of the Lower Extremity Etiology: Wound Location: Right, Proximal, Anterior Lower Leg Secondary Venous Leg Ulcer Wounding Event: Blister Etiology: Date Acquired: 04/29/2021 Wound Open Weeks Of Treatment: 0 Status: Clustered Wound: No Comorbid Cataracts, Glaucoma, Chronic Obstructive Pulmonary Disease History: (COPD), Arrhythmia, Congestive Heart Failure, Coronary Artery Disease, Hypertension, Type II Diabetes, Gout, Neuropathy Photos Wound Measurements Length: (cm) 0.6 Width: (cm) 0.6 Depth: (cm) 0.1 Area: (cm) 0.283 Volume: (cm) 0.028 % Reduction in Area: 0% % Reduction in Volume: 0% Epithelialization: Small (1-33%) Tunneling: No Undermining: No Wound Description Classification: Grade 1 Wound Margin: Flat and Intact Exudate Amount: Medium Exudate Type: Serous Exudate Color: amber Foul Odor After Cleansing: No Slough/Fibrino No Wound Bed Granulation Amount: Large (67-100%) Exposed Structure Granulation Quality: Red Fascia Exposed: No Necrotic Amount: None Present (0%) Fat Layer (Subcutaneous Tissue) Exposed:  Yes Tendon Exposed: No Muscle Exposed: No Joint Exposed: No Bone Exposed: No Treatment Notes Wound #2 (Lower Leg) Wound Laterality: Right, Anterior, Proximal Cleanser Peri-Wound Care Triamcinolone 15 (g) Discharge Instruction: Use triamcinolone 15 (g) mixed with lotion to lower legs Sween Lotion (Moisturizing lotion) Discharge Instruction: Apply moisturizing lotion as directed Topical Primary Dressing KerraCel Ag Gelling Fiber Dressing, 2x2 in (silver alginate) Discharge Instruction: Apply silver alginate to wound bed as instructed Secondary Dressing Woven Gauze Sponge, Non-Sterile 4x4 in Discharge Instruction: Apply over primary dressing as directed. Secured With Compression Wrap Kerlix Roll 4.5x3.1 (in/yd) Discharge Instruction: Apply Kerlix and Coban compression as directed. Coban Self-Adherent Wrap 4x5 (in/yd) Discharge Instruction: Apply over Kerlix as directed. Compression Stockings Add-Ons Electronic Signature(s) Signed: 07/15/2021 4:04:47 PM By:  Sandre Kitty Signed: 07/15/2021 5:41:09 PM By: Baruch Gouty RN, BSN Entered By: Sandre Kitty on 07/15/2021 14:49:14 -------------------------------------------------------------------------------- Wound Assessment Details Patient Name: Date of Service: [name], [name] 07/15/2021 1:15 PM Medical Record Number: [id number] Patient Account Number: [id number] Date of Birth/Sex: Treating RN: [birth year] (85 y.o. [name] Primary Care Delmas Faucett: [name] Other Clinician: Referring Alvenia Treese: Treating Winter Trefz/Extender: Bryon Lions in Treatment: 0 Wound Status Wound Number: 3 Primary Diabetic Wound/Ulcer of the Lower Extremity Etiology: Wound Location: Right, Lateral Lower Leg Secondary Venous Leg Ulcer Wounding Event: Blister Etiology: Date Acquired: 04/29/2021 Wound Open Weeks Of Treatment: 0 Status: Clustered Wound: No Comorbid Cataracts, Glaucoma, Chronic  Obstructive Pulmonary Disease History: (COPD), Arrhythmia, Congestive Heart Failure, Coronary Artery Disease, Hypertension, Type II Diabetes, Gout, Neuropathy Photos Wound Measurements Length: (cm) 1.3 Width: (cm) 1.3 Depth: (cm) 0.1 Area: (cm) 1.327 Volume: (cm) 0.133 % Reduction in Area: 0% % Reduction in Volume: 0% Epithelialization: Small (1-33%) Tunneling: No Undermining: No Wound Description Classification: Grade 1 Wound Margin: Flat and Intact Exudate Amount: Medium Exudate Type: Serous Exudate Color: amber Foul Odor After Cleansing: No Slough/Fibrino No Wound Bed Granulation Amount: Large (67-100%) Exposed Structure Granulation Quality: Red Fascia Exposed: No Necrotic Amount: None Present (0%) Fat Layer (Subcutaneous Tissue) Exposed: Yes Tendon Exposed: No Muscle Exposed: No Joint Exposed: No Bone Exposed: No Treatment Notes Wound #3 (Lower Leg) Wound Laterality: Right, Lateral Cleanser Peri-Wound Care Triamcinolone 15 (g) Discharge Instruction: Use triamcinolone 15 (g) mixed with lotion to lower legs Sween Lotion (Moisturizing lotion) Discharge Instruction: Apply moisturizing lotion as directed Topical Primary Dressing KerraCel Ag Gelling Fiber Dressing, 2x2 in (silver alginate) Discharge Instruction: Apply silver alginate to wound bed as instructed Secondary Dressing Woven Gauze Sponge, Non-Sterile 4x4 in Discharge Instruction: Apply over primary dressing as directed. Secured With Compression Wrap Kerlix Roll 4.5x3.1 (in/yd) Discharge Instruction: Apply Kerlix and Coban compression as directed. Coban Self-Adherent Wrap 4x5 (in/yd) Discharge Instruction: Apply over Kerlix as directed. Compression Stockings Add-Ons Electronic Signature(s) Signed: 07/15/2021 4:04:47 PM By: Sandre Kitty Signed: 07/15/2021 5:41:09 PM By: Baruch Gouty RN, BSN Entered By: Sandre Kitty on 07/15/2021  14:49:50 -------------------------------------------------------------------------------- Wound Assessment Details Patient Name: Date of Service: [name], [name] 07/15/2021 1:15 PM Medical Record Number: [id number] Patient Account Number: [id number] Date of Birth/Sex: Treating RN: [birth year] (85 y.o. [name] Primary Care Sadarius Norman: [name] Other Clinician: Referring [place] Sarin[place]: Treating Lylie Blacklock/Extender: Bryon Lions in Treatment: 0 Wound Status Wound Number: 4 Primary Diabetic Wound/Ulcer of the Lower Extremity Etiology: Wound Location: Left, Anterior Lower Leg Secondary Venous Leg Ulcer Wounding Event: Blister Etiology: Date Acquired: 04/29/2021 Wound Open Weeks Of Treatment: 0 Status: Clustered Wound: No Comorbid Cataracts, Glaucoma, Chronic Obstructive Pulmonary Disease History: (COPD), Arrhythmia, Congestive Heart Failure, Coronary Artery Disease, Hypertension, Type II Diabetes, Gout, Neuropathy Photos Wound Measurements Length: (cm) 1 Width: (cm) 0.8 Depth: (cm) 0.1 Area: (cm) 0.628 Volume: (cm) 0.063 % Reduction in Area: 0% % Reduction in Volume: 0% Epithelialization: Medium (34-66%) Tunneling: No Undermining: No Wound Description Classification: Grade 1 Wound Margin: Flat and Intact Exudate Amount: Medium Exudate Type: Serous Exudate Color: amber Foul Odor After Cleansing: No Slough/Fibrino No Wound Bed Granulation Amount: Large (67-100%) Exposed Structure Granulation Quality: Red Fascia Exposed: No Necrotic Amount: None Present (0%) Fat Layer (Subcutaneous Tissue) Exposed: Yes Tendon Exposed: No Muscle Exposed: No Joint Exposed: No Bone Exposed: No Treatment Notes Wound #4 (Lower Leg) Wound Laterality: Left, Anterior Cleanser Peri-Wound Care Triamcinolone  15 (g) Discharge Instruction: Use triamcinolone 15 (g) mixed with lotion to lower legs Sween Lotion (Moisturizing lotion) Discharge Instruction:  Apply moisturizing lotion as directed Topical Primary Dressing KerraCel Ag Gelling Fiber Dressing, 2x2 in (silver alginate) Discharge Instruction: Apply silver alginate to wound bed as instructed Secondary Dressing Woven Gauze Sponge, Non-Sterile 4x4 in Discharge Instruction: Apply over primary dressing as directed. Secured With Compression Wrap Kerlix Roll 4.5x3.1 (in/yd) Discharge Instruction: Apply Kerlix and Coban compression as directed. Coban Self-Adherent Wrap 4x5 (in/yd) Discharge Instruction: Apply over Kerlix as directed. Compression Stockings Add-Ons Electronic Signature(s) Signed: 07/15/2021 4:04:47 PM By: Sandre Kitty Signed: 07/15/2021 5:41:09 PM By: Baruch Gouty RN, BSN Entered By: Sandre Kitty on 07/15/2021 14:51:17 -------------------------------------------------------------------------------- Wound Assessment Details Patient Name: Date of Service: [name], [name] 07/15/2021 1:15 PM Medical Record Number: [id number] Patient Account Number: [id number] Date of Birth/Sex: Treating RN: 1935/09/20 (85 y.o. [name] Primary Care Kaya Klausing: [name] Other Clinician: Referring [place]: Treating Lavine Hargrove/Extender: Bryon Lions in Treatment: 0 Wound Status Wound Number: 5 Primary Diabetic Wound/Ulcer of the Lower Extremity Etiology: Wound Location: Left, Lateral Lower Leg Secondary Venous Leg Ulcer Wounding Event: Blister Etiology: Date Acquired: 04/29/2021 Wound Open Weeks Of Treatment: 0 Status: Clustered Wound: No Comorbid Cataracts, Glaucoma, Chronic Obstructive Pulmonary Disease History: (COPD), Arrhythmia, Congestive Heart Failure, Coronary Artery Disease, Hypertension, Type II Diabetes, Gout, Neuropathy Photos Wound Measurements Length: (cm) 0.9 Width: (cm) 1.4 Depth: (cm) 0.1 Area: (cm) 0.99 Volume: (cm) 0.099 % Reduction in Area: 0% % Reduction in Volume: 0% Epithelialization: Small  (1-33%) Tunneling: No Undermining: No Wound Description Classification: Grade 1 Wound Margin: Flat and Intact Exudate Amount: Medium Exudate Type: Serosanguineous Exudate Color: red, brown Foul Odor After Cleansing: No Slough/Fibrino Yes Wound Bed Granulation Amount: Large (67-100%) Exposed Structure Granulation Quality: Red Fascia Exposed: No Necrotic Amount: Small (1-33%) Fat Layer (Subcutaneous Tissue) Exposed: Yes Necrotic Quality: Adherent Slough Tendon Exposed: No Muscle Exposed: No Joint Exposed: No Bone Exposed: No Treatment Notes Wound #5 (Lower Leg) Wound Laterality: Left, Lateral Cleanser Peri-Wound Care Triamcinolone 15 (g) Discharge Instruction: Use triamcinolone 15 (g) mixed with lotion to lower legs Sween Lotion (Moisturizing lotion) Discharge Instruction: Apply moisturizing lotion as directed Topical Primary Dressing KerraCel Ag Gelling Fiber Dressing, 2x2 in (silver alginate) Discharge Instruction: Apply silver alginate to wound bed as instructed Secondary Dressing Woven Gauze Sponge, Non-Sterile 4x4 in Discharge Instruction: Apply over primary dressing as directed. Secured With Compression Wrap Kerlix Roll 4.5x3.1 (in/yd) Discharge Instruction: Apply Kerlix and Coban compression as directed. Coban Self-Adherent Wrap 4x5 (in/yd) Discharge Instruction: Apply over Kerlix as directed. Compression Stockings Add-Ons Electronic Signature(s) Signed: 07/15/2021 4:04:47 PM By: Sandre Kitty Signed: 07/15/2021 5:41:09 PM By: Baruch Gouty RN, BSN Entered By: Sandre Kitty on 07/15/2021 14:52:11 -------------------------------------------------------------------------------- Vitals Details Patient Name: Date of Service: [name], [name] 07/15/2021 1:15 PM Medical Record Number: [id number] Patient Account Number: [id number] Date of Birth/Sex: Treating RN: Aug 24, 1936 (85 y.o. [name] Primary Care Dejanae Helser: [name] Other  Clinician: Referring Anahita Cua: Treating Johathan Province/Extender: Bryon Lions in Treatment: 0 Vital Signs Time Taken: 14:12 Temperature (F): 98.3 Height (in): 69 Pulse (bpm): 72 Source: Stated Respiratory Rate (breaths/min): 18 Weight (lbs): 185 Blood Pressure (mmHg): 124/75 Body Mass Index (BMI): 27.3 Capillary Blood Glucose (mg/dl): 120 Reference Range: 80 - 120 mg / dl Notes glucose per pt report this am Electronic Signature(s) Signed: 07/15/2021 5:41:09 PM By: Baruch Gouty RN, BSN Entered By: Baruch Gouty on  07/15/2021 14:15:04 

## 2021-07-20 DIAGNOSIS — I872 Venous insufficiency (chronic) (peripheral): Secondary | ICD-10-CM | POA: Diagnosis not present

## 2021-07-21 ENCOUNTER — Other Ambulatory Visit: Payer: Self-pay

## 2021-07-21 ENCOUNTER — Encounter (HOSPITAL_BASED_OUTPATIENT_CLINIC_OR_DEPARTMENT_OTHER): Payer: Medicare Other | Admitting: Physician Assistant

## 2021-07-21 ENCOUNTER — Ambulatory Visit: Payer: Medicare Other

## 2021-07-21 DIAGNOSIS — L97818 Non-pressure chronic ulcer of other part of right lower leg with other specified severity: Secondary | ICD-10-CM | POA: Diagnosis not present

## 2021-07-21 DIAGNOSIS — I482 Chronic atrial fibrillation, unspecified: Secondary | ICD-10-CM | POA: Diagnosis not present

## 2021-07-21 DIAGNOSIS — L97811 Non-pressure chronic ulcer of other part of right lower leg limited to breakdown of skin: Secondary | ICD-10-CM | POA: Diagnosis not present

## 2021-07-21 DIAGNOSIS — I6350 Cerebral infarction due to unspecified occlusion or stenosis of unspecified cerebral artery: Secondary | ICD-10-CM | POA: Diagnosis not present

## 2021-07-21 DIAGNOSIS — E1136 Type 2 diabetes mellitus with diabetic cataract: Secondary | ICD-10-CM | POA: Diagnosis not present

## 2021-07-21 DIAGNOSIS — I4819 Other persistent atrial fibrillation: Secondary | ICD-10-CM | POA: Diagnosis not present

## 2021-07-21 DIAGNOSIS — Z7901 Long term (current) use of anticoagulants: Secondary | ICD-10-CM | POA: Diagnosis not present

## 2021-07-21 DIAGNOSIS — E1140 Type 2 diabetes mellitus with diabetic neuropathy, unspecified: Secondary | ICD-10-CM | POA: Diagnosis not present

## 2021-07-21 DIAGNOSIS — Z8673 Personal history of transient ischemic attack (TIA), and cerebral infarction without residual deficits: Secondary | ICD-10-CM | POA: Diagnosis not present

## 2021-07-21 DIAGNOSIS — I87333 Chronic venous hypertension (idiopathic) with ulcer and inflammation of bilateral lower extremity: Secondary | ICD-10-CM | POA: Diagnosis not present

## 2021-07-21 DIAGNOSIS — L97821 Non-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 leg limited to breakdown of skin: Secondary | ICD-10-CM | POA: Diagnosis not present

## 2021-07-21 DIAGNOSIS — I110 Hypertensive heart disease with heart failure: Secondary | ICD-10-CM | POA: Diagnosis not present

## 2021-07-21 DIAGNOSIS — L97828 Non-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 leg with other specified severity: Secondary | ICD-10-CM | POA: Diagnosis not present

## 2021-07-21 DIAGNOSIS — Z5181 Encounter for therapeutic drug level monitoring: Secondary | ICD-10-CM

## 2021-07-21 DIAGNOSIS — E1151 Type 2 diabetes mellitus with diabetic peripheral angiopathy without gangrene: Secondary | ICD-10-CM | POA: Diagnosis not present

## 2021-07-21 DIAGNOSIS — E11622 Type 2 diabetes mellitus with other skin ulcer: Secondary | ICD-10-CM | POA: Diagnosis not present

## 2021-07-21 DIAGNOSIS — Z950 Presence of cardiac pacemaker: Secondary | ICD-10-CM | POA: Diagnosis not present

## 2021-07-21 DIAGNOSIS — I509 Heart failure, unspecified: Secondary | ICD-10-CM | POA: Diagnosis not present

## 2021-07-21 LAB — POCT INR: INR: 2.7 (ref 2.0–3.0)

## 2021-07-21 NOTE — Progress Notes (Addendum)
[name], [name] ([id number]) Visit Report for 07/21/2021 Chief Complaint Document Details Patient Name: Date of Service: [name] ID M. 07/21/2021 2:00 PM Medical Record Number: [id number] Patient Account Number: [id number] Date of Birth/Sex: Treating RN: 1936/05/21 (85 y.o. Vincent [name] Primary Care Provider: [name] Other Clinician: Referring Provider: Treating Provider/Extender: Guadalupe Maple, Karrar Weeks in Treatment: 0 Information Obtained from: Patient Chief Complaint 07/15/2021 patient is here for wounds on his bilateral lower legs Electronic Signature(s) Signed: 07/21/2021 2:05:40 PM By: Worthy Keeler PA-C Entered By: Worthy Keeler on 07/21/2021 14:05:40 -------------------------------------------------------------------------------- Debridement Details Patient Name: Date of Service: [name], [name] 07/21/2021 2:00 PM Medical Record Number: [id number] Patient Account Number: [id number] Date of Birth/Sex: Treating RN: [birth year] (85 y.o. Vincent [name], [name] Primary Care Provider: [name] Other Clinician: Referring Provider: Treating Provider/Extender: Guadalupe Maple, Karrar Weeks in Treatment: 0 Debridement Performed for Assessment: Wound #8 Right,Medial Lower Leg Performed By: Physician Worthy Keeler, PA Debridement Type: Debridement Severity of Tissue Pre Debridement: Limited to breakdown of skin Level of Consciousness (Pre-procedure): Awake and Alert Pre-procedure Verification/Time Out Yes - 14:40 Taken: Start Time: 14:41 Pain Control: Other : Benzocaine 20% T Area Debrided (L x W): otal 0.5 (cm) x 0.3 (cm) = 0.15 (cm) Tissue and other material debrided: Viable, Skin: Dermis , Skin: Epidermis Level: Skin/Epidermis Debridement Description: Selective/Open Wound Instrument: Forceps, Scissors Bleeding: Minimum Hemostasis Achieved: Pressure End Time: 14:50 Procedural Pain: 0 Post Procedural Pain: 0 Response to  Treatment: Procedure was tolerated well Level of Consciousness (Post- Awake and Alert procedure): Post Debridement Measurements of Total Wound Length: (cm) 0.5 Width: (cm) 0.3 Depth: (cm) 0.2 Volume: (cm) 0.024 Character of Wound/Ulcer Post Debridement: Improved Severity of Tissue Post Debridement: Limited to breakdown of skin Post Procedure Diagnosis Same as Pre-procedure Electronic Signature(s) Signed: 07/21/2021 5:33:12 PM By: Worthy Keeler PA-C Signed: 07/21/2021 5:48:49 PM By: Deon Pilling RN, BSN Entered By: Deon Pilling on 07/21/2021 14:53:47 -------------------------------------------------------------------------------- Debridement Details Patient Name: Date of Service: [name], [name] 07/21/2021 2:00 PM Medical Record Number: [id number] Patient Account Number: [id number] Date of Birth/Sex: Treating RN: 1936/05/06 (85 y.o. Vincent [name], [name] Primary Care Provider: [name] Other Clinician: Referring Provider: Treating Provider/Extender: Guadalupe Maple, Karrar Weeks in Treatment: 0 Debridement Performed for Assessment: Wound #1 Right,Distal,Anterior Lower Leg Performed By: Physician Worthy Keeler, PA Debridement Type: Debridement Severity of Tissue Pre Debridement: Limited to breakdown of skin Level of Consciousness (Pre-procedure): Awake and Alert Pre-procedure Verification/Time Out Yes - 14:40 Taken: Start Time: 14:41 Pain Control: Other : Benzocaine 20% T Area Debrided (L x W): otal 2.5 (cm) x 2 (cm) = 5 (cm) Tissue and other material debrided: Viable, Skin: Dermis , Skin: Epidermis Level: Skin/Epidermis Debridement Description: Selective/Open Wound Instrument: Forceps, Scissors Bleeding: Minimum Hemostasis Achieved: Pressure End Time: 14:50 Procedural Pain: 0 Post Procedural Pain: 0 Response to Treatment: Procedure was tolerated well Level of Consciousness (Post- Awake and Alert procedure): Post Debridement Measurements of Total  Wound Length: (cm) 2.5 Width: (cm) 2 Depth: (cm) 0.1 Volume: (cm) 0.393 Character of Wound/Ulcer Post Debridement: Improved Severity of Tissue Post Debridement: Limited to breakdown of skin Post Procedure Diagnosis Same as Pre-procedure Electronic Signature(s) Signed: 07/21/2021 5:33:12 PM By: Worthy Keeler PA-C Signed: 07/21/2021 5:48:49 PM By: Deon Pilling RN, BSN Entered By: Deon Pilling on 07/21/2021 14:54:18 -------------------------------------------------------------------------------- Debridement Details Patient Name: Date of Service: [name], [name] 07/21/2021 2:00 PM  Medical Record Number: [id number] Patient Account Number: [id number] Date of Birth/Sex: Treating RN: [birth year] (85 y.o. Vincent [name], [name] Primary Care Provider: [name] Other Clinician: Referring Provider: Treating Provider/Extender: Guadalupe Maple, Karrar Weeks in Treatment: 0 Debridement Performed for Assessment: Wound #5 Left,Lateral Lower Leg Performed By: Physician Worthy Keeler, PA Debridement Type: Debridement Severity of Tissue Pre Debridement: Limited to breakdown of skin Level of Consciousness (Pre-procedure): Awake and Alert Pre-procedure Verification/Time Out Yes - 14:40 Taken: Start Time: 14:41 Pain Control: Other : Benzocaine 20% T Area Debrided (L x W): otal 0.5 (cm) x 1 (cm) = 0.5 (cm) Tissue and other material debrided: Viable, Skin: Dermis , Skin: Epidermis Level: Skin/Epidermis Debridement Description: Selective/Open Wound Instrument: Forceps, Scissors Bleeding: Minimum Hemostasis Achieved: Pressure End Time: 14:50 Procedural Pain: 0 Post Procedural Pain: 0 Response to Treatment: Procedure was tolerated well Level of Consciousness (Post- Awake and Alert procedure): Post Debridement Measurements of Total Wound Length: (cm) 0.5 Width: (cm) 1 Depth: (cm) 0.1 Volume: (cm) 0.039 Character of Wound/Ulcer Post Debridement: Improved Severity of  Tissue Post Debridement: Limited to breakdown of skin Post Procedure Diagnosis Same as Pre-procedure Electronic Signature(s) Signed: 07/21/2021 5:33:12 PM By: Worthy Keeler PA-C Signed: 07/21/2021 5:48:49 PM By: Deon Pilling RN, BSN Entered By: Deon Pilling on 07/21/2021 14:54:42 -------------------------------------------------------------------------------- Debridement Details Patient Name: Date of Service: [name], [name] 07/21/2021 2:00 PM Medical Record Number: [id number] Patient Account Number: [id number] Date of Birth/Sex: Treating RN: Sep 28, 1935 (85 y.o. Vincent [name], [name] Primary Care Provider: [name] Other Clinician: Referring Provider: Treating Provider/Extender: Guadalupe Maple, Karrar Weeks in Treatment: 0 Debridement Performed for Assessment: Wound #6 Left,Distal,Anterior Lower Leg Performed By: Physician Worthy Keeler, PA Debridement Type: Debridement Severity of Tissue Pre Debridement: Limited to breakdown of skin Level of Consciousness (Pre-procedure): Awake and Alert Pre-procedure Verification/Time Out Yes - 14:40 Taken: Start Time: 14:41 Pain Control: Other : Benzocaine 20% T Area Debrided (L x W): otal 2 (cm) x 2.5 (cm) = 5 (cm) Tissue and other material debrided: Viable, Skin: Dermis , Skin: Epidermis Level: Skin/Epidermis Debridement Description: Selective/Open Wound Instrument: Forceps, Scissors Bleeding: Minimum Hemostasis Achieved: Pressure End Time: 14:50 Procedural Pain: 0 Post Procedural Pain: 0 Response to Treatment: Procedure was tolerated well Level of Consciousness (Post- Awake and Alert procedure): Post Debridement Measurements of Total Wound Length: (cm) 2 Width: (cm) 2.5 Depth: (cm) 0.1 Volume: (cm) 0.393 Character of Wound/Ulcer Post Debridement: Improved Severity of Tissue Post Debridement: Limited to breakdown of skin Post Procedure Diagnosis Same as Pre-procedure Electronic Signature(s) Signed:  07/21/2021 5:33:12 PM By: Worthy Keeler PA-C Signed: 07/21/2021 5:48:49 PM By: Deon Pilling RN, BSN Entered By: Deon Pilling on 07/21/2021 14:55:14 -------------------------------------------------------------------------------- HPI Details Patient Name: Date of Service: [name], [name] 07/21/2021 2:00 PM Medical Record Number: [id number] Patient Account Number: [id number] Date of Birth/Sex: Treating RN: Jul 24, 1936 (85 y.o. Vincent [name] Primary Care Provider: [name] Other Clinician: Referring Provider: Treating Provider/Extender: Guadalupe Maple, Karrar Weeks in Treatment: 0 History of Present Illness HPI Description: ADMISSION 07/15/2021 This is an 85 year old man who came here for evaluation of wounds on his bilateral predominantly anterior lower legs mid aspect of the tibia. He seems to have been dealing with this for most of this year. Currently with 3 wounds on the right and 2 on the left. At least some of these seem to start off as blisters probably the majority. He has been following with Dr. Anabel Bene of  [facility] according the patient back he has an appointment Dr. Anabel Bene tomorrow he was given a prescription for triamcinolone which she has been applying daily in fact it was renewed yesterday. Patient is a diabetic no recent hemoglobin A1c. He is on Coumadin for persistent atrial fibrillation. Vein and vascular in December 2021 at which time he had bilateral foot pain and finger pain. ABIs bilaterally were noncompressible however the TBI on the right was only 0.39. There were biphasic waveforms on the left his ABI was noncompressible with biphasic waveforms but with a TBI of 0.6. He was not felt to have a primary vascular issue. There is a [name] of Raynaud's phenomenon as well. Past medical history includes cellulitis of the right leg in June 2022, type 2 diabetes, persistent lower extremity edema, bilateral foot and ankle pain,  Raynaud's phenomenon, coronary artery disease, congestive heart failure, pacemaker, none sustained ventricular tachycardia, remote CVA paroxysmal atrial fib on Coumadin. We did not reattempt ABIs in our clinic 07/22/2019 patient appears to be doing decently well in regard to his wounds as compared to last week with the wounds that were present last week. Fortunately I do not see any signs of active infection which is great news. No fevers, chills, nausea, vomiting, or diarrhea. With that being said he has several new blisters that are getting need to be addressed today. Electronic Signature(s) Signed: 07/21/2021 2:54:36 PM By: Worthy Keeler PA-C Entered By: Worthy Keeler on 07/21/2021 14:54:36 -------------------------------------------------------------------------------- Physical Exam Details Patient Name: Date of Service: [name], [name] 07/21/2021 2:00 PM Medical Record Number: [id number] Patient Account Number: [id number] Date of Birth/Sex: Treating RN: [birth year] (85 y.o. Vincent [name] Primary Care Provider: [name] Other Clinician: Referring Provider: Treating Provider/Extender: Guadalupe Maple, Karrar Weeks in Treatment: 0 Constitutional Well-nourished and well-hydrated in no acute distress. Respiratory normal breathing without difficulty. Psychiatric this patient is able to make decisions and demonstrates good insight into disease process. Alert and Oriented x 3. pleasant and cooperative. Notes Patient's wounds currently again showed a lot of epithelial growth from the interim from last week to this week. With that being said that in regard to the original wounds although some of the new blisters did require some light debridement using scissors and forceps to clear away the blister tissue so we can try to get new tissue growing. I think he did well with the Curlex and Coban wraps. Electronic Signature(s) Signed: 07/21/2021 2:55:15 PM By: Worthy Keeler PA-C Entered By: Worthy Keeler on 07/21/2021 14:55:15 -------------------------------------------------------------------------------- Physician Orders Details Patient Name: Date of Service: [name], [name] 07/21/2021 2:00 PM Medical Record Number: [id number] Patient Account Number: [id number] Date of Birth/Sex: Treating RN: September 15, 1935 (85 y.o. Vincent [name], [name] Primary Care Provider: [name] Other Clinician: Referring Provider: Treating Provider/Extender: Guadalupe Maple, Karrar Weeks in Treatment: 0 Verbal / Phone Orders: No Diagnosis Coding ICD-10 Coding Code Description [phone] Non-pressure chronic ulcer of other part of right lower leg with other specified severity L97.828 Non-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 leg with other specified severity I87.333 Chronic venous hypertension (idiopathic) with ulcer and inflammation of bilateral lower extremity E11.51 Type 2 diabetes mellitus with diabetic peripheral angiopathy without gangrene Follow-up Appointments ppointment in 1 week. - Dr. Dellia Nims Wednesday *****extra time 75 minutes ***** Return A Bathing/ Shower/ Hygiene May shower with protection but do not get wound dressing(s) wet. Edema Control - Lymphedema / SCD / Other Bilateral  Lower Extremities Elevate legs to the level of the heart or above for 30 minutes daily and/or when sitting, a frequency of: - whenever sitting Avoid standing for long periods of time. Exercise regularly Wound Treatment Wound #1 - Lower Leg Wound Laterality: Right, Anterior, Distal Peri-Wound Care: Triamcinolone 15 (g) 1 x Per Week/15 Days Discharge Instructions: Use triamcinolone 15 (g) mixed with lotion to lower legs Peri-Wound Care: Sween Lotion (Moisturizing lotion) 1 x Per Week/15 Days Discharge Instructions: Apply moisturizing lotion as directed Prim Dressing: KerraCel Ag Gelling Fiber Dressing, 2x2 in (silver alginate) 1 x Per Week/15 Days ary Discharge  Instructions: Apply silver alginate to wound bed as instructed Secondary Dressing: ABD Pad, 8x10 1 x Per Week/15 Days Discharge Instructions: Apply over primary dressing as directed. Compression Wrap: Kerlix Roll 4.5x3.1 (in/yd) 1 x Per Week/15 Days Discharge Instructions: Apply Kerlix and Coban compression as directed. Compression Wrap: Coban Self-Adherent Wrap 4x5 (in/yd) 1 x Per Week/15 Days Discharge Instructions: Apply over Kerlix as directed. Wound #2 - Lower Leg Wound Laterality: Right, Anterior, Proximal Peri-Wound Care: Triamcinolone 15 (g) 1 x Per Week/15 Days Discharge Instructions: Use triamcinolone 15 (g) mixed with lotion to lower legs Peri-Wound Care: Sween Lotion (Moisturizing lotion) 1 x Per Week/15 Days Discharge Instructions: Apply moisturizing lotion as directed Prim Dressing: KerraCel Ag Gelling Fiber Dressing, 2x2 in (silver alginate) 1 x Per Week/15 Days ary Discharge Instructions: Apply silver alginate to wound bed as instructed Secondary Dressing: ABD Pad, 8x10 1 x Per Week/15 Days Discharge Instructions: Apply over primary dressing as directed. Compression Wrap: Kerlix Roll 4.5x3.1 (in/yd) 1 x Per Week/15 Days Discharge Instructions: Apply Kerlix and Coban compression as directed. Compression Wrap: Coban Self-Adherent Wrap 4x5 (in/yd) 1 x Per Week/15 Days Discharge Instructions: Apply over Kerlix as directed. Wound #3 - Lower Leg Wound Laterality: Right, Lateral Peri-Wound Care: Triamcinolone 15 (g) 1 x Per Week/15 Days Discharge Instructions: Use triamcinolone 15 (g) mixed with lotion to lower legs Peri-Wound Care: Sween Lotion (Moisturizing lotion) 1 x Per Week/15 Days Discharge Instructions: Apply moisturizing lotion as directed Prim Dressing: KerraCel Ag Gelling Fiber Dressing, 2x2 in (silver alginate) 1 x Per Week/15 Days ary Discharge Instructions: Apply silver alginate to wound bed as instructed Secondary Dressing: ABD Pad, 8x10 1 x Per Week/15  Days Discharge Instructions: Apply over primary dressing as directed. Compression Wrap: Kerlix Roll 4.5x3.1 (in/yd) 1 x Per Week/15 Days Discharge Instructions: Apply Kerlix and Coban compression as directed. Compression Wrap: Coban Self-Adherent Wrap 4x5 (in/yd) 1 x Per Week/15 Days Discharge Instructions: Apply over Kerlix as directed. Wound #4 - Lower Leg Wound Laterality: Left, Anterior Peri-Wound Care: Triamcinolone 15 (g) 1 x Per Week/15 Days Discharge Instructions: Use triamcinolone 15 (g) mixed with lotion to lower legs Peri-Wound Care: Sween Lotion (Moisturizing lotion) 1 x Per Week/15 Days Discharge Instructions: Apply moisturizing lotion as directed Prim Dressing: KerraCel Ag Gelling Fiber Dressing, 2x2 in (silver alginate) 1 x Per Week/15 Days ary Discharge Instructions: Apply silver alginate to wound bed as instructed Secondary Dressing: ABD Pad, 8x10 1 x Per Week/15 Days Discharge Instructions: Apply over primary dressing as directed. Compression Wrap: Kerlix Roll 4.5x3.1 (in/yd) 1 x Per Week/15 Days Discharge Instructions: Apply Kerlix and Coban compression as directed. Compression Wrap: Coban Self-Adherent Wrap 4x5 (in/yd) 1 x Per Week/15 Days Discharge Instructions: Apply over Kerlix as directed. Wound #5 - Lower Leg Wound Laterality: Left, Lateral Peri-Wound Care: Triamcinolone 15 (g) 1 x Per Week/15 Days Discharge Instructions: Use triamcinolone 15 (g) mixed with lotion  to lower legs Peri-Wound Care: Sween Lotion (Moisturizing lotion) 1 x Per Week/15 Days Discharge Instructions: Apply moisturizing lotion as directed Prim Dressing: KerraCel Ag Gelling Fiber Dressing, 2x2 in (silver alginate) 1 x Per Week/15 Days ary Discharge Instructions: Apply silver alginate to wound bed as instructed Secondary Dressing: ABD Pad, 8x10 1 x Per Week/15 Days Discharge Instructions: Apply over primary dressing as directed. Compression Wrap: Kerlix Roll 4.5x3.1 (in/yd) 1 x Per Week/15  Days Discharge Instructions: Apply Kerlix and Coban compression as directed. Compression Wrap: Coban Self-Adherent Wrap 4x5 (in/yd) 1 x Per Week/15 Days Discharge Instructions: Apply over Kerlix as directed. Wound #6 - Lower Leg Wound Laterality: Left, Anterior, Distal Peri-Wound Care: Triamcinolone 15 (g) 1 x Per Week/15 Days Discharge Instructions: Use triamcinolone 15 (g) mixed with lotion to lower legs Peri-Wound Care: Sween Lotion (Moisturizing lotion) 1 x Per Week/15 Days Discharge Instructions: Apply moisturizing lotion as directed Prim Dressing: KerraCel Ag Gelling Fiber Dressing, 2x2 in (silver alginate) 1 x Per Week/15 Days ary Discharge Instructions: Apply silver alginate to wound bed as instructed Secondary Dressing: ABD Pad, 8x10 1 x Per Week/15 Days Discharge Instructions: Apply over primary dressing as directed. Compression Wrap: Kerlix Roll 4.5x3.1 (in/yd) 1 x Per Week/15 Days Discharge Instructions: Apply Kerlix and Coban compression as directed. Compression Wrap: Coban Self-Adherent Wrap 4x5 (in/yd) 1 x Per Week/15 Days Discharge Instructions: Apply over Kerlix as directed. Wound #7 - Lower Leg Wound Laterality: Right, Anterior Peri-Wound Care: Triamcinolone 15 (g) 1 x Per Week/15 Days Discharge Instructions: Use triamcinolone 15 (g) mixed with lotion to lower legs Peri-Wound Care: Sween Lotion (Moisturizing lotion) 1 x Per Week/15 Days Discharge Instructions: Apply moisturizing lotion as directed Prim Dressing: KerraCel Ag Gelling Fiber Dressing, 2x2 in (silver alginate) 1 x Per Week/15 Days ary Discharge Instructions: Apply silver alginate to wound bed as instructed Secondary Dressing: ABD Pad, 8x10 1 x Per Week/15 Days Discharge Instructions: Apply over primary dressing as directed. Compression Wrap: Kerlix Roll 4.5x3.1 (in/yd) 1 x Per Week/15 Days Discharge Instructions: Apply Kerlix and Coban compression as directed. Compression Wrap: Coban Self-Adherent Wrap 4x5  (in/yd) 1 x Per Week/15 Days Discharge Instructions: Apply over Kerlix as directed. Wound #8 - Lower Leg Wound Laterality: Right, Medial Peri-Wound Care: Triamcinolone 15 (g) 1 x Per Week/15 Days Discharge Instructions: Use triamcinolone 15 (g) mixed with lotion to lower legs Peri-Wound Care: Sween Lotion (Moisturizing lotion) 1 x Per Week/15 Days Discharge Instructions: Apply moisturizing lotion as directed Prim Dressing: KerraCel Ag Gelling Fiber Dressing, 2x2 in (silver alginate) 1 x Per Week/15 Days ary Discharge Instructions: Apply silver alginate to wound bed as instructed Secondary Dressing: ABD Pad, 8x10 1 x Per Week/15 Days Discharge Instructions: Apply over primary dressing as directed. Compression Wrap: Kerlix Roll 4.5x3.1 (in/yd) 1 x Per Week/15 Days Discharge Instructions: Apply Kerlix and Coban compression as directed. Compression Wrap: Coban Self-Adherent Wrap 4x5 (in/yd) 1 x Per Week/15 Days Discharge Instructions: Apply over Kerlix as directed. Electronic Signature(s) Signed: 07/21/2021 5:33:12 PM By: Worthy Keeler PA-C Signed: 07/21/2021 5:48:49 PM By: Deon Pilling RN, BSN Entered By: Deon Pilling on 07/21/2021 14:56:17 -------------------------------------------------------------------------------- Problem List Details Patient Name: Date of Service: [name], [name] 07/21/2021 2:00 PM Medical Record Number: [id number] Patient Account Number: [id number] Date of Birth/Sex: Treating RN: 1935-11-04 (85 y.o. Vincent [name] Primary Care Provider: [name] Other Clinician: Referring Provider: Treating Provider/Extender: Guadalupe Maple, Karrar Weeks in Treatment: 0 Active Problems ICD-10 Encounter Code Description Active Date MDM  Diagnosis L97.818 Non-pressure chronic ulcer of other part of right lower leg with other specified 07/15/2021 No Yes severity L97.828 Non-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 leg with other specified  07/15/2021 No Yes severity I87.333 Chronic venous hypertension (idiopathic) with ulcer and inflammation of 07/15/2021 No Yes bilateral lower extremity E11.51 Type 2 diabetes mellitus with diabetic peripheral angiopathy without gangrene 07/15/2021 No Yes Inactive Problems Resolved Problems Electronic Signature(s) Signed: 07/21/2021 2:05:31 PM By: Worthy Keeler PA-C Entered By: Worthy Keeler on 07/21/2021 14:05:30 -------------------------------------------------------------------------------- Progress Note Details Patient Name: Date of Service: [name], [name] 07/21/2021 2:00 PM Medical Record Number: [id number] Patient Account Number: [id number] Date of Birth/Sex: Treating RN: 12-21-1935 (85 y.o. Vincent [name] Primary Care Provider: [name] Other Clinician: Referring Provider: Treating Provider/Extender: Guadalupe Maple, Karrar Weeks in Treatment: 0 Subjective Chief Complaint Information obtained from Patient 07/15/2021 patient is here for wounds on his bilateral lower legs History of Present Illness (HPI) ADMISSION 07/15/2021 This is an 85 year old man who came here for evaluation of wounds on his bilateral predominantly anterior lower legs mid aspect of the tibia. He seems to have been dealing with this for most of this year. Currently with 3 wounds on the right and 2 on the left. At least some of these seem to start off as blisters probably the majority. He has been following with Dr. Anabel Bene of [facility] according the patient back he has an appointment Dr. Anabel Bene tomorrow he was given a prescription for triamcinolone which she has been applying daily in fact it was renewed yesterday. Patient is a diabetic no recent hemoglobin A1c. He is on Coumadin for persistent atrial fibrillation. Vein and vascular in December 2021 at which time he had bilateral foot pain and finger pain. ABIs bilaterally were noncompressible however the TBI on the  right was only 0.39. There were biphasic waveforms on the left his ABI was noncompressible with biphasic waveforms but with a TBI of 0.6. He was not felt to have a primary vascular issue. There is a [name] of Raynaud's phenomenon as well. Past medical history includes cellulitis of the right leg in June 2022, type 2 diabetes, persistent lower extremity edema, bilateral foot and ankle pain, Raynaud's phenomenon, coronary artery disease, congestive heart failure, pacemaker, none sustained ventricular tachycardia, remote CVA paroxysmal atrial fib on Coumadin. We did not reattempt ABIs in our clinic 07/22/2019 patient appears to be doing decently well in regard to his wounds as compared to last week with the wounds that were present last week. Fortunately I do not see any signs of active infection which is great news. No fevers, chills, nausea, vomiting, or diarrhea. With that being said he has several new blisters that are getting need to be addressed today. Objective Constitutional Well-nourished and well-hydrated in no acute distress. Vitals Time Taken: 2:03 PM, Height: 69 in, Weight: 185 lbs, BMI: 27.3, Temperature: 98.4 F, Pulse: 97 bpm, Respiratory Rate: 18 breaths/min, Blood Pressure: 145/84 mmHg, Capillary Blood Glucose: 126 mg/dl. Respiratory normal breathing without difficulty. Psychiatric this patient is able to make decisions and demonstrates good insight into disease process. Alert and Oriented x 3. pleasant and cooperative. General Notes: Patient's wounds currently again showed a lot of epithelial growth from the interim from last week to this week. With that being said that in regard to the original wounds although some of the new blisters did require some light debridement using scissors and forceps to clear away the blister tissue  so we can try to get new tissue growing. I think he did well with the Curlex and Coban wraps. Integumentary (Hair, Skin) Wound #1 status is Open.  Original cause of wound was Gradually Appeared. The date acquired was: 04/29/2021. The wound is located on the Right,Distal,Anterior Lower Leg. The wound measures 2.5cm length x 1cm width x 0.1cm depth; 1.963cm^2 area and 0.196cm^3 volume. There is Fat Layer (Subcutaneous Tissue) exposed. There is no tunneling or undermining noted. There is a none present amount of drainage noted. The wound margin is flat and intact. There is no granulation within the wound bed. There is a large (67-100%) amount of necrotic tissue within the wound bed including Eschar and Adherent Slough. Wound #2 status is Open. Original cause of wound was Blister. The date acquired was: 04/29/2021. The wound is located on the Right,Proximal,Anterior Lower Leg. The wound measures 0.1cm length x 0.1cm width x 0.1cm depth; 0.008cm^2 area and 0.001cm^3 volume. There is no tunneling or undermining noted. There is a none present amount of drainage noted. The wound margin is flat and intact. There is no granulation within the wound bed. There is no necrotic tissue within the wound bed. Wound #3 status is Open. Original cause of wound was Blister. The date acquired was: 04/29/2021. The wound is located on the Right,Lateral Lower Leg. The wound measures 2cm length x 1.5cm width x 0.1cm depth; 2.356cm^2 area and 0.236cm^3 volume. There is Fat Layer (Subcutaneous Tissue) exposed. There is no tunneling or undermining noted. There is a medium amount of serous drainage noted. The wound margin is flat and intact. There is large (67-100%) red granulation within the wound bed. There is no necrotic tissue within the wound bed. Wound #4 status is Open. Original cause of wound was Blister. The date acquired was: 04/29/2021. The wound is located on the Left,Anterior Lower Leg. The wound measures 3cm length x 4.2cm width x 0.1cm depth; 9.896cm^2 area and 0.99cm^3 volume. There is Fat Layer (Subcutaneous Tissue) exposed. There is no tunneling or undermining noted.  There is a medium amount of serous drainage noted. The wound margin is flat and intact. There is large (67-100%) red granulation within the wound bed. There is no necrotic tissue within the wound bed. Wound #5 status is Open. Original cause of wound was Blister. The date acquired was: 04/29/2021. The wound is located on the Left,Lateral Lower Leg. The wound measures 0.5cm length x 1cm width x 0.1cm depth; 0.393cm^2 area and 0.039cm^3 volume. There is Fat Layer (Subcutaneous Tissue) exposed. There is no tunneling or undermining noted. There is a medium amount of serosanguineous drainage noted. The wound margin is flat and intact. There is large (67- 100%) red granulation within the wound bed. There is a small (1-33%) amount of necrotic tissue within the wound bed including Adherent Slough. Wound #6 status is Open. Original cause of wound was Blister. The date acquired was: 07/21/2021. The wound is located on the [facility] Lower Leg. The wound measures 1.7cm length x 2.5cm width x 0.1cm depth; 3.338cm^2 area and 0.334cm^3 volume. There is Fat Layer (Subcutaneous Tissue) exposed. There is no tunneling or undermining noted. There is a medium amount of serosanguineous drainage noted. The wound margin is distinct with the outline attached to the wound base. There is large (67-100%) red, pink granulation within the wound bed. There is a small (1-33%) amount of necrotic tissue within the wound bed including Adherent Slough. Wound #7 status is Open. Original cause of wound was Blister. The date acquired  was: 07/21/2021. The wound is located on the Right,Anterior Lower Leg. The wound measures 1.5cm length x 1.2cm width x 0.1cm depth; 1.414cm^2 area and 0.141cm^3 volume. There is Fat Layer (Subcutaneous Tissue) exposed. There is no tunneling or undermining noted. There is a medium amount of serosanguineous drainage noted. The wound margin is distinct with the outline attached to the wound base. There is  large (67-100%) red granulation within the wound bed. There is no necrotic tissue within the wound bed. Wound #8 status is Open. Original cause of wound was Blister. The date acquired was: 07/21/2021. The wound is located on the Right,Medial Lower Leg. The wound measures 0.5cm length x 0.3cm width x 0.2cm depth; 0.118cm^2 area and 0.024cm^3 volume. There is Fat Layer (Subcutaneous Tissue) exposed. There is no tunneling or undermining noted. There is a medium amount of serosanguineous drainage noted. The wound margin is distinct with the outline attached to the wound base. There is small (1-33%) pink, pale granulation within the wound bed. There is a large (67-100%) amount of necrotic tissue within the wound bed including Adherent Slough. 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 There was a Selective/Open Wound Skin/Epidermis Debridement with a total area of 5 sq cm performed by Worthy Keeler, PA. With the following instrument(s): Forceps, and Scissors to remove Viable tissue/material. Material removed includes Skin: Dermis and Skin: Epidermis and after achieving pain control using Other (Benzocaine 20%). A time out was conducted at 14:40, prior to the start of the procedure. A Minimum amount of bleeding was controlled with Pressure. The procedure was tolerated well with a pain level of 0 throughout and a pain level of 0 following  the procedure. Post Debridement Measurements: 2.5cm length x 2cm width x 0.1cm depth; 0.393cm^3 volume. Character of Wound/Ulcer Post Debridement is improved. Severity of Tissue Post Debridement is: Limited to breakdown of skin. Post procedure Diagnosis Wound #1: Same as Pre-Procedure Wound #5 Pre-procedure diagnosis of Wound #5 is a Diabetic Wound/Ulcer of the Lower Extremity located on the Left,Lateral Lower Leg .Severity of Tissue Pre Debridement is: Limited to breakdown of skin. There was a Selective/Open Wound Skin/Epidermis Debridement with a total area of 0.5 sq cm performed by Worthy Keeler, PA. With the following instrument(s): Forceps, and Scissors to remove Viable tissue/material. Material removed includes Skin: Dermis and Skin: Epidermis and after achieving pain control using Other (Benzocaine 20%). A time out was conducted at 14:40, prior to the start of the procedure. A Minimum amount of bleeding was controlled with Pressure. The procedure was tolerated well with a pain level of 0 throughout and a pain level of 0 following the procedure. Post Debridement Measurements: 0.5cm length x 1cm width x 0.1cm depth; 0.039cm^3 volume. Character of Wound/Ulcer Post Debridement is improved. Severity of Tissue Post Debridement is: Limited to breakdown of skin. Post procedure Diagnosis Wound #5: Same as Pre-Procedure Wound #6 Pre-procedure diagnosis of Wound #6 is a Diabetic Wound/Ulcer of the Lower Extremity located on the Left,Distal,Anterior Lower Leg .Severity of Tissue Pre Debridement is: Limited to  breakdown of skin. There was a Selective/Open Wound Skin/Epidermis Debridement with a total area of 5 sq cm performed by Worthy Keeler, PA. With the following instrument(s): Forceps, and Scissors to remove Viable tissue/material. Material removed includes Skin: Dermis and Skin: Epidermis and after achieving pain control using Other (Benzocaine 20%). A time out was conducted at 14:40, prior to  the start of the procedure. A Minimum amount of bleeding was controlled with Pressure. The procedure was tolerated well with a pain level of 0 throughout and a pain level of 0 following the procedure. Post Debridement Measurements: 2cm length x 2.5cm width x 0.1cm depth; 0.393cm^3 volume. Character of Wound/Ulcer Post Debridement is improved. Severity of Tissue Post Debridement is: Limited to breakdown of skin. Post procedure Diagnosis Wound #6: Same as Pre-Procedure Wound #8 Pre-procedure diagnosis of Wound #8 is a Diabetic Wound/Ulcer of the Lower Extremity located on the Right,Medial Lower Leg .Severity of Tissue Pre Debridement is: Limited to breakdown of skin. There was a Selective/Open Wound Skin/Epidermis Debridement with a total area of 0.15 sq cm performed by Worthy Keeler, PA. With the following instrument(s): Forceps, and Scissors to remove Viable tissue/material. Material removed includes Skin: Dermis and Skin: Epidermis and after achieving pain control using Other (Benzocaine 20%). A time out was conducted at 14:40, prior to the start of the procedure. A Minimum amount of bleeding was controlled with Pressure. The procedure was tolerated well with a pain level of 0 throughout and a pain level of 0 following the procedure. Post Debridement Measurements: 0.5cm length x 0.3cm width x 0.2cm depth; 0.024cm^3 volume. Character of Wound/Ulcer Post Debridement is improved. Severity of Tissue Post Debridement is: Limited to breakdown of skin. Post procedure Diagnosis Wound #8: Same as Pre-Procedure Plan 1. Would recommend currently that we go ahead and continue with the wound care measures as before and the patient is in agreement with the plan. This includes the use of the silver alginate to the open wound locations. 2. We will also get a continue with the Curlex and Coban wrap which I think is probably still the best option at this point. 3. I am also can recommend we have the patient  continue to monitor for any signs of infection such as increased pain if he has any issues he should let me know soon as possible. We will see patient back for reevaluation in 1 week here in the clinic. If anything worsens or changes patient will contact our office for additional recommendations. Electronic Signature(s) Signed: 07/21/2021 2:55:45 PM By: Worthy Keeler PA-C Entered By: Worthy Keeler on 07/21/2021 14:55:45 -------------------------------------------------------------------------------- SuperBill Details Patient Name: Date of Service: [name], [name] 07/21/2021 Medical Record Number: [id number] Patient Account Number: [id number] Date of Birth/Sex: Treating RN: Dec 09, 1935 (85 y.o. [name] Primary Care Provider: [name] Other Clinician: Referring Provider: Treating Provider/Extender: Guadalupe Maple, Karrar Weeks in Treatment: 0 Diagnosis Coding ICD-10 Codes Code Description [phone] Non-pressure chronic ulcer of other part of right lower leg with other specified severity L97.828 Non-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 leg with other specified severity I87.333 Chronic venous hypertension (idiopathic) with ulcer and inflammation of bilateral lower extremity E11.51 Type 2 diabetes mellitus with diabetic peripheral angiopathy without gangrene Facility Procedures CPT4 Code: [id number] Description: [phone] - DEBRIDE WOUND 1ST 20 SQ CM OR < ICD-10 Diagnosis Description L97.818 Non-pressure chronic ulcer of other part of right lower leg with other specified se L97.828 Non-pressure chronic ulcer of  other part of left lower leg with other  specified sev Modifier: verity erity Quantity: 1 Physician Procedures : CPT4 Code Description Modifier [id number] - WC PHYS DEBR WO ANESTH 20 SQ CM ICD-10 Diagnosis Description L97.818 Non-pressure chronic ulcer of other part of right lower leg with other specified severity L97.828 Non-pressure chronic  ulcer of other  part of left lower leg with other specified severity Quantity: 1 Electronic Signature(s) Signed: 07/21/2021 2:57:37 PM By: Worthy Keeler PA-C Entered By: Worthy Keeler on 07/21/2021 14:57:37

## 2021-07-21 NOTE — Progress Notes (Signed)
Stanczyk, Collins M. ([id number]) Visit Report for 07/21/2021 Arrival Information Details Patient Name: Date of Service: [name] ID M. 07/21/2021 2:00 PM Medical Record Number: [id number] Patient Account Number: [id number] Date of Birth/Sex: Treating RN: 1936/06/11 (85 y.o. [name] Primary Care Biff Rutigliano: [name] Other Clinician: Referring Arynn Armand: Treating Tank Difiore/Extender: Guadalupe Maple, Karrar Weeks in Treatment: 0 Visit Information History Since Last Visit Added or deleted any medications: No Patient Arrived: Ambulatory Any new allergies or adverse reactions: No Arrival Time: 14:01 Had a fall or experienced change in No Transfer Assistance: None activities of daily living that may affect Patient Identification Verified: Yes risk of falls: Secondary Verification Process Completed: Yes Signs or symptoms of abuse/neglect since last visito No Patient Requires Transmission-Based Precautions: No Hospitalized since last visit: No Patient Has Alerts: Yes Implantable device outside of the clinic excluding No Patient Alerts: Patient on Blood Thinner cellular tissue based products placed in the center R ABI = 1.43 TBI= .39 since last visit: L ABI = 1.85 TBI= .6 Has Dressing in Place as Prescribed: Yes Has Compression in Place as Prescribed: Yes Pain Present Now: No Electronic Signature(s) Signed: 07/21/2021 4:59:20 PM By: Lorrin Jackson Entered By: Lorrin Jackson on 07/21/2021 14:01:37 -------------------------------------------------------------------------------- Encounter Discharge Information Details Patient Name: Date of Service: [name], [name] 07/21/2021 2:00 PM Medical Record Number: [id number] Patient Account Number: [id number] Date of Birth/Sex: Treating RN: 1936-08-24 (85 y.o. [name] Primary Care Alieah Brinton: [name] Other Clinician: Referring Mansoor Hillyard: Treating Cosby Proby/Extender: Guadalupe Maple, Karrar Weeks in  Treatment: 0 Encounter Discharge Information Items Post Procedure Vitals Discharge Condition: Stable Temperature (F): 98.4 Ambulatory Status: Ambulatory Pulse (bpm): 74 Discharge Destination: Home Respiratory Rate (breaths/min): 20 Transportation: Private Auto Blood Pressure (mmHg): 145/84 Accompanied By: self Schedule Follow-up Appointment: Yes Clinical Summary of Care: Electronic Signature(s) Signed: 07/21/2021 5:48:49 PM By: Deon Pilling RN, BSN Entered By: Deon Pilling on 07/21/2021 14:59:05 -------------------------------------------------------------------------------- Lower Extremity Assessment Details Patient Name: Date of Service: [name], [name] 07/21/2021 2:00 PM Medical Record Number: [id number] Patient Account Number: [id number] Date of Birth/Sex: Treating RN: Mar 21, 1936 (85 y.o. [name] Primary Care Andre Swander: [name] Other Clinician: Referring Tarnisha Kachmar: Treating Danashia Landers/Extender: Guadalupe Maple, Karrar Weeks in Treatment: 0 Edema Assessment Assessed: Shirlyn Goltz: Yes] Patrice Paradise: Yes] Edema: [Left: Yes] [Right: Yes] Calf Left: Right: Point of Measurement: From Medial Instep 34 cm 35 cm Ankle Left: Right: Point of Measurement: From Medial Instep 22.5 cm 22 cm Vascular Assessment Pulses: Dorsalis Pedis Palpable: [Left:Yes] [Right:Yes] Electronic Signature(s) Signed: 07/21/2021 5:48:49 PM By: Deon Pilling RN, BSN Entered By: Deon Pilling on 07/21/2021 14:28:03 -------------------------------------------------------------------------------- [place] Details Patient Name: Date of Service: [name], [name] 07/21/2021 2:00 PM Medical Record Number: [id number] Patient Account Number: [id number] Date of Birth/Sex: Treating RN: 12-27-1935 (85 y.o. [name] Primary Care Jamarr Treinen: [name] Other Clinician: Referring Cola Highfill: Treating Cord Wilczynski/Extender: Guadalupe Maple, Fransisca Connors in  Treatment: [place] reviewed with physician Active Inactive Nutrition Nursing Diagnoses: Impaired glucose control: actual or potential Potential for alteratiion in Nutrition/Potential for imbalanced nutrition Goals: Patient/caregiver will maintain therapeutic glucose control Date Initiated: 07/15/2021 Target Resolution Date: 08/12/2021 Goal Status: Active Interventions: Assess HgA1c results as ordered upon admission and as needed Assess patient nutrition upon admission and as needed per policy Provide education on elevated blood sugars and impact on wound healing Treatment Activities: Dietary management education, guidance and counseling : 07/15/2021 Giving  encouragement to exercise : 07/15/2021 Patient referred to Primary Care Physician for further nutritional evaluation : 07/15/2021 Notes: Venous Leg Ulcer Nursing Diagnoses: Knowledge deficit related to disease process and management Potential for venous Insuffiency (use before diagnosis confirmed) Goals: Patient will maintain optimal edema control Date Initiated: 07/15/2021 Target Resolution Date: 08/12/2021 Goal Status: Active Interventions: Assess peripheral edema status every visit. Compression as ordered Provide education on venous insufficiency Treatment Activities: Therapeutic compression applied : 07/15/2021 Notes: Wound/Skin Impairment Nursing Diagnoses: Impaired tissue integrity Knowledge deficit related to ulceration/compromised skin integrity Goals: Patient/caregiver will verbalize understanding of skin care regimen Date Initiated: 07/15/2021 Target Resolution Date: 08/12/2021 Goal Status: Active Ulcer/skin breakdown will have a volume reduction of 30% by week 4 Date Initiated: 07/15/2021 Target Resolution Date: 08/12/2021 Goal Status: Active Interventions: Assess patient/caregiver ability to obtain necessary supplies Assess patient/caregiver ability to perform ulcer/skin care  regimen upon admission and as needed Assess ulceration(s) every visit Provide education on ulcer and skin care Treatment Activities: Skin care regimen initiated : 07/15/2021 Topical wound management initiated : 07/15/2021 Notes: Electronic Signature(s) Signed: 07/21/2021 5:48:49 PM By: Deon Pilling RN, BSN Entered By: Deon Pilling on 07/21/2021 14:34:57 -------------------------------------------------------------------------------- Pain Assessment Details Patient Name: Date of Service: [name], [name] 07/21/2021 2:00 PM Medical Record Number: [id number] Patient Account Number: [id number] Date of Birth/Sex: Treating RN: [birth year] (85 y.o. [name] Primary Care Manolo Bosket: [name] Other Clinician: Referring Jakevious Hollister: Treating Yvette Roark/Extender: Guadalupe Maple, Karrar Weeks in Treatment: 0 Active Problems Location of Pain Severity and Description of Pain Patient Has Paino No Site Locations Rate the pain. Current Pain Level: 0 Pain Management and Medication Current Pain Management: Medication: No Cold Application: No Rest: No Massage: No Activity: No T.E.N.S.: No Heat Application: No Leg drop or elevation: No Is the Current Pain Management Adequate: Adequate How does your wound impact your activities of daily livingo Sleep: No Bathing: No Appetite: No Relationship With Others: No Bladder Continence: No Emotions: No Bowel Continence: No Work: No Toileting: No Drive: No Dressing: No Hobbies: No Engineer, maintenance) Signed: 07/21/2021 5:48:49 PM By: Deon Pilling RN, BSN Entered By: Deon Pilling on 07/21/2021 14:27:39 -------------------------------------------------------------------------------- Patient/Caregiver Education Details Patient Name: Date of Service: [name] ID M. 11/23/2022andnbsp2:00 PM Medical Record Number: [id number] Patient Account Number: [id number] Date of Birth/Gender: Treating RN: 1936-02-11 (85 y.o. [name] Primary Care Physician: [name] Other Clinician: Referring Physician: Treating Physician/Extender: Guadalupe Maple, Karrar Weeks in Treatment: 0 Education Assessment Education Provided To: Patient Education Topics Provided Wound/Skin Impairment: Handouts: Skin Care Do's and Dont's Methods: Explain/Verbal Responses: Reinforcements needed Electronic Signature(s) Signed: 07/21/2021 5:48:49 PM By: Deon Pilling RN, BSN Entered By: Deon Pilling on 07/21/2021 14:35:13 -------------------------------------------------------------------------------- Wound Assessment Details Patient Name: Date of Service: [name], [name] 07/21/2021 2:00 PM Medical Record Number: [id number] Patient Account Number: [id number] Date of Birth/Sex: Treating RN: 30-Jun-1936 (85 y.o. [name] Primary Care Tikia Skilton: [name] Other Clinician: Referring Richardo Popoff: Treating Taziah Difatta/Extender: Guadalupe Maple, Karrar Weeks in Treatment: 0 Wound Status Wound Number: 1 Primary Diabetic Wound/Ulcer of the Lower Extremity Etiology: Wound Location: Right, Distal, Anterior Lower Leg Secondary Venous Leg Ulcer Wounding Event: Gradually Appeared Etiology: Date Acquired: 04/29/2021 Wound Open Weeks Of Treatment: 0 Status: Clustered Wound: No Comorbid Cataracts, Glaucoma, Chronic Obstructive Pulmonary Disease History: (COPD), Arrhythmia, Congestive Heart Failure, Coronary Artery Disease, Hypertension, Type II Diabetes, Gout, Neuropathy Photos Wound Measurements Length: (cm) 2.5 Width: (cm) 1 Depth: (cm)  0.1 Area: (cm) 1.963 Volume: (cm) 0.196 % Reduction in Area: 70.2% % Reduction in Volume: 70.3% Epithelialization: None Tunneling: No Undermining: No Wound Description Classification: Grade 1 Wound Margin: Flat and Intact Exudate Amount: None Present Foul Odor After Cleansing: No Slough/Fibrino Yes Wound Bed Granulation Amount: None Present (0%)  Exposed Structure Necrotic Amount: Large (67-100%) Fascia Exposed: No Necrotic Quality: Eschar, Adherent Slough Fat Layer (Subcutaneous Tissue) Exposed: Yes Tendon Exposed: No Muscle Exposed: No Joint Exposed: No Bone Exposed: No Treatment Notes Wound #1 (Lower Leg) Wound Laterality: Right, Anterior, Distal Cleanser Peri-Wound Care Triamcinolone 15 (g) Discharge Instruction: Use triamcinolone 15 (g) mixed with lotion to lower legs Sween Lotion (Moisturizing lotion) Discharge Instruction: Apply moisturizing lotion as directed Topical Primary Dressing KerraCel Ag Gelling Fiber Dressing, 2x2 in (silver alginate) Discharge Instruction: Apply silver alginate to wound bed as instructed Secondary Dressing ABD Pad, 8x10 Discharge Instruction: Apply over primary dressing as directed. Secured With Compression Wrap Kerlix Roll 4.5x3.1 (in/yd) Discharge Instruction: Apply Kerlix and Coban compression as directed. Coban Self-Adherent Wrap 4x5 (in/yd) Discharge Instruction: Apply over Kerlix as directed. Compression Stockings Add-Ons Electronic Signature(s) Signed: 07/21/2021 4:59:20 PM By: Lorrin Jackson Signed: 07/21/2021 5:48:49 PM By: Deon Pilling RN, BSN Entered By: Deon Pilling on 07/21/2021 14:26:34 -------------------------------------------------------------------------------- Wound Assessment Details Patient Name: Date of Service: [name], [name] 07/21/2021 2:00 PM Medical Record Number: [id number] Patient Account Number: [id number] Date of Birth/Sex: Treating RN: 05-11-1936 (85 y.o. [name] Primary Care Rekia Kujala: [name] Other Clinician: Referring Joellyn Grandt: Treating Azalyn Sliwa/Extender: Guadalupe Maple, Karrar Weeks in Treatment: 0 Wound Status Wound Number: 2 Primary Diabetic Wound/Ulcer of the Lower Extremity Etiology: Wound Location: Right, Proximal, Anterior Lower Leg Secondary Venous Leg Ulcer Wounding Event: Blister Etiology: Date  Acquired: 04/29/2021 Wound Open Weeks Of Treatment: 0 Status: Clustered Wound: No Comorbid Cataracts, Glaucoma, Chronic Obstructive Pulmonary Disease History: (COPD), Arrhythmia, Congestive Heart Failure, Coronary Artery Disease, Hypertension, Type II Diabetes, Gout, Neuropathy Photos Wound Measurements Length: (cm) 0.1 Width: (cm) 0.1 Depth: (cm) 0.1 Area: (cm) 0.008 Volume: (cm) 0.001 % Reduction in Area: 97.2% % Reduction in Volume: 96.4% Epithelialization: Large (67-100%) Tunneling: No Undermining: No Wound Description Classification: Grade 1 Wound Margin: Flat and Intact Exudate Amount: None Present Foul Odor After Cleansing: No Slough/Fibrino No Wound Bed Granulation Amount: None Present (0%) Exposed Structure Necrotic Amount: None Present (0%) Fascia Exposed: No Fat Layer (Subcutaneous Tissue) Exposed: No Tendon Exposed: No Muscle Exposed: No Joint Exposed: No Bone Exposed: No Treatment Notes Wound #2 (Lower Leg) Wound Laterality: Right, Anterior, Proximal Cleanser Peri-Wound Care Triamcinolone 15 (g) Discharge Instruction: Use triamcinolone 15 (g) mixed with lotion to lower legs Sween Lotion (Moisturizing lotion) Discharge Instruction: Apply moisturizing lotion as directed Topical Primary Dressing KerraCel Ag Gelling Fiber Dressing, 2x2 in (silver alginate) Discharge Instruction: Apply silver alginate to wound bed as instructed Secondary Dressing ABD Pad, 8x10 Discharge Instruction: Apply over primary dressing as directed. Secured With Compression Wrap Kerlix Roll 4.5x3.1 (in/yd) Discharge Instruction: Apply Kerlix and Coban compression as directed. Coban Self-Adherent Wrap 4x5 (in/yd) Discharge Instruction: Apply over Kerlix as directed. Compression Stockings Add-Ons Electronic Signature(s) Signed: 07/21/2021 4:59:20 PM By: Lorrin Jackson Signed: 07/21/2021 5:48:49 PM By: Deon Pilling RN, BSN Entered By: Deon Pilling on 07/21/2021  14:27:23 -------------------------------------------------------------------------------- Wound Assessment Details Patient Name: Date of Service: [name], [name] 07/21/2021 2:00 PM Medical Record Number: [id number] Patient Account Number: [id number] Date of Birth/Sex: Treating RN: 1936-03-16 (85 y.o. [name] Primary Care  Madigan Rosensteel: [name] Other Clinician: Referring Mystery Schrupp: Treating Samera Macy/Extender: Guadalupe Maple, Karrar Weeks in Treatment: 0 Wound Status Wound Number: 3 Primary Diabetic Wound/Ulcer of the Lower Extremity Etiology: Wound Location: Right, Lateral Lower Leg Secondary Venous Leg Ulcer Wounding Event: Blister Etiology: Date Acquired: 04/29/2021 Wound Open Weeks Of Treatment: 0 Status: Clustered Wound: No Comorbid Cataracts, Glaucoma, Chronic Obstructive Pulmonary Disease History: (COPD), Arrhythmia, Congestive Heart Failure, Coronary Artery Disease, Hypertension, Type II Diabetes, Gout, Neuropathy Photos Wound Measurements Length: (cm) 2 Width: (cm) 1.5 Depth: (cm) 0.1 Area: (cm) 2.356 Volume: (cm) 0.236 % Reduction in Area: -77.5% % Reduction in Volume: -77.4% Epithelialization: Medium (34-66%) Tunneling: No Undermining: No Wound Description Classification: Grade 1 Wound Margin: Flat and Intact Exudate Amount: Medium Exudate Type: Serous Exudate Color: amber Foul Odor After Cleansing: No Slough/Fibrino No Wound Bed Granulation Amount: Large (67-100%) Exposed Structure Granulation Quality: Red Fascia Exposed: No Necrotic Amount: None Present (0%) Fat Layer (Subcutaneous Tissue) Exposed: Yes Tendon Exposed: No Muscle Exposed: No Joint Exposed: No Bone Exposed: No Treatment Notes Wound #3 (Lower Leg) Wound Laterality: Right, Lateral Cleanser Peri-Wound Care Triamcinolone 15 (g) Discharge Instruction: Use triamcinolone 15 (g) mixed with lotion to lower legs Sween Lotion (Moisturizing lotion) Discharge  Instruction: Apply moisturizing lotion as directed Topical Primary Dressing KerraCel Ag Gelling Fiber Dressing, 2x2 in (silver alginate) Discharge Instruction: Apply silver alginate to wound bed as instructed Secondary Dressing ABD Pad, 8x10 Discharge Instruction: Apply over primary dressing as directed. Secured With Compression Wrap Kerlix Roll 4.5x3.1 (in/yd) Discharge Instruction: Apply Kerlix and Coban compression as directed. Coban Self-Adherent Wrap 4x5 (in/yd) Discharge Instruction: Apply over Kerlix as directed. Compression Stockings Add-Ons Electronic Signature(s) Signed: 07/21/2021 4:59:20 PM By: Lorrin Jackson Signed: 07/21/2021 5:48:49 PM By: Deon Pilling RN, BSN Entered By: Deon Pilling on 07/21/2021 14:28:23 -------------------------------------------------------------------------------- Wound Assessment Details Patient Name: Date of Service: [name], [name] 07/21/2021 2:00 PM Medical Record Number: [id number] Patient Account Number: [id number] Date of Birth/Sex: Treating RN: 02-15-1936 (85 y.o. [name] Primary Care Irina Okelly: [name] Other Clinician: Referring Chevi Lim: Treating Daveion Robar/Extender: Guadalupe Maple, Karrar Weeks in Treatment: 0 Wound Status Wound Number: 4 Primary Diabetic Wound/Ulcer of the Lower Extremity Etiology: Wound Location: Left, Anterior Lower Leg Secondary Venous Leg Ulcer Wounding Event: Blister Etiology: Date Acquired: 04/29/2021 Wound Open Weeks Of Treatment: 0 Status: Clustered Wound: No Comorbid Cataracts, Glaucoma, Chronic Obstructive Pulmonary Disease History: (COPD), Arrhythmia, Congestive Heart Failure, Coronary Artery Disease, Hypertension, Type II Diabetes, Gout, Neuropathy Photos Wound Measurements Length: (cm) 3 Width: (cm) 4.2 Depth: (cm) 0.1 Area: (cm) 9.896 Volume: (cm) 0.99 % Reduction in Area: -1475.8% % Reduction in Volume: -1471.4% Epithelialization: Medium  (34-66%) Tunneling: No Undermining: No Wound Description Classification: Grade 1 Wound Margin: Flat and Intact Exudate Amount: Medium Exudate Type: Serous Exudate Color: amber Foul Odor After Cleansing: No Slough/Fibrino No Wound Bed Granulation Amount: Large (67-100%) Exposed Structure Granulation Quality: Red Fascia Exposed: No Necrotic Amount: None Present (0%) Fat Layer (Subcutaneous Tissue) Exposed: Yes Tendon Exposed: No Muscle Exposed: No Joint Exposed: No Bone Exposed: No Treatment Notes Wound #4 (Lower Leg) Wound Laterality: Left, Anterior Cleanser Peri-Wound Care Triamcinolone 15 (g) Discharge Instruction: Use triamcinolone 15 (g) mixed with lotion to lower legs Sween Lotion (Moisturizing lotion) Discharge Instruction: Apply moisturizing lotion as directed Topical Primary Dressing KerraCel Ag Gelling Fiber Dressing, 2x2 in (silver alginate) Discharge Instruction: Apply silver alginate to wound bed as instructed Secondary Dressing ABD Pad, 8x10 Discharge Instruction: Apply over primary dressing  as directed. Secured With Compression Wrap Kerlix Roll 4.5x3.1 (in/yd) Discharge Instruction: Apply Kerlix and Coban compression as directed. Coban Self-Adherent Wrap 4x5 (in/yd) Discharge Instruction: Apply over Kerlix as directed. Compression Stockings Add-Ons Electronic Signature(s) Signed: 07/21/2021 4:59:20 PM By: Lorrin Jackson Signed: 07/21/2021 5:48:49 PM By: Deon Pilling RN, BSN Entered By: Deon Pilling on 07/21/2021 14:29:44 -------------------------------------------------------------------------------- Wound Assessment Details Patient Name: Date of Service: [name], [name] 07/21/2021 2:00 PM Medical Record Number: [id number] Patient Account Number: [id number] Date of Birth/Sex: Treating RN: 12-11-1935 (85 y.o. [name] Primary Care Chiamaka Latka: [name] Other Clinician: Referring Marajade Lei: Treating Marque Bango/Extender: Guadalupe Maple, Karrar Weeks in Treatment: 0 Wound Status Wound Number: 5 Primary Diabetic Wound/Ulcer of the Lower Extremity Etiology: Wound Location: Left, Lateral Lower Leg Secondary Venous Leg Ulcer Wounding Event: Blister Etiology: Date Acquired: 04/29/2021 Wound Open Weeks Of Treatment: 0 Status: Clustered Wound: No Comorbid Cataracts, Glaucoma, Chronic Obstructive Pulmonary Disease History: (COPD), Arrhythmia, Congestive Heart Failure, Coronary Artery Disease, Hypertension, Type II Diabetes, Gout, Neuropathy Photos Wound Measurements Length: (cm) 0.5 Width: (cm) 1 Depth: (cm) 0.1 Area: (cm) 0.393 Volume: (cm) 0.039 % Reduction in Area: 60.3% % Reduction in Volume: 60.6% Epithelialization: Small (1-33%) Tunneling: No Undermining: No Wound Description Classification: Grade 1 Wound Margin: Flat and Intact Exudate Amount: Medium Exudate Type: Serosanguineous Exudate Color: red, brown Foul Odor After Cleansing: No Slough/Fibrino Yes Wound Bed Granulation Amount: Large (67-100%) Exposed Structure Granulation Quality: Red Fascia Exposed: No Necrotic Amount: Small (1-33%) Fat Layer (Subcutaneous Tissue) Exposed: Yes Necrotic Quality: Adherent Slough Tendon Exposed: No Muscle Exposed: No Joint Exposed: No Bone Exposed: No Treatment Notes Wound #5 (Lower Leg) Wound Laterality: Left, Lateral Cleanser Peri-Wound Care Triamcinolone 15 (g) Discharge Instruction: Use triamcinolone 15 (g) mixed with lotion to lower legs Sween Lotion (Moisturizing lotion) Discharge Instruction: Apply moisturizing lotion as directed Topical Primary Dressing KerraCel Ag Gelling Fiber Dressing, 2x2 in (silver alginate) Discharge Instruction: Apply silver alginate to wound bed as instructed Secondary Dressing ABD Pad, 8x10 Discharge Instruction: Apply over primary dressing as directed. Secured With Compression Wrap Kerlix Roll 4.5x3.1 (in/yd) Discharge Instruction: Apply Kerlix and  Coban compression as directed. Coban Self-Adherent Wrap 4x5 (in/yd) Discharge Instruction: Apply over Kerlix as directed. Compression Stockings Add-Ons Electronic Signature(s) Signed: 07/21/2021 4:59:20 PM By: Lorrin Jackson Signed: 07/21/2021 5:48:49 PM By: Deon Pilling RN, BSN Entered By: Deon Pilling on 07/21/2021 14:30:47 -------------------------------------------------------------------------------- Wound Assessment Details Patient Name: Date of Service: [name], [name] 07/21/2021 2:00 PM Medical Record Number: [id number] Patient Account Number: [id number] Date of Birth/Sex: Treating RN: 1936/03/07 (85 y.o. [name] Primary Care Myrah Strawderman: [name] Other Clinician: Referring Eulalio Reamy: Treating Aveon Colquhoun/Extender: Guadalupe Maple, Karrar Weeks in Treatment: 0 Wound Status Wound Number: 6 Primary Diabetic Wound/Ulcer of the Lower Extremity Etiology: Wound Location: Left, Distal, Anterior Lower Leg Secondary Lymphedema Wounding Event: Blister Etiology: Date Acquired: 07/21/2021 Wound Open Weeks Of Treatment: 0 Status: Clustered Wound: No Comorbid Cataracts, Glaucoma, Chronic Obstructive Pulmonary Disease History: (COPD), Arrhythmia, Congestive Heart Failure, Coronary Artery Disease, Hypertension, Type II Diabetes, Gout, Neuropathy Wound Measurements Length: (cm) 1.7 Width: (cm) 2.5 Depth: (cm) 0.1 Area: (cm) 3.338 Volume: (cm) 0.334 % Reduction in Area: % Reduction in Volume: Epithelialization: None Tunneling: No Undermining: No Wound Description Classification: Grade 2 Wound Margin: Distinct, outline attached Exudate Amount: Medium Exudate Type: Serosanguineous Exudate Color: red, brown Foul Odor After Cleansing: No Slough/Fibrino Yes Wound Bed Granulation Amount: Large (67-100%) Exposed Structure Granulation Quality: Red,  Pink Fascia Exposed: No Necrotic Amount: Small (1-33%) Fat Layer (Subcutaneous Tissue) Exposed:  Yes Necrotic Quality: Adherent Slough Tendon Exposed: No Muscle Exposed: No Joint Exposed: No Bone Exposed: No Treatment Notes Wound #6 (Lower Leg) Wound Laterality: Left, Anterior, Distal Cleanser Peri-Wound Care Triamcinolone 15 (g) Discharge Instruction: Use triamcinolone 15 (g) mixed with lotion to lower legs Sween Lotion (Moisturizing lotion) Discharge Instruction: Apply moisturizing lotion as directed Topical Primary Dressing KerraCel Ag Gelling Fiber Dressing, 2x2 in (silver alginate) Discharge Instruction: Apply silver alginate to wound bed as instructed Secondary Dressing ABD Pad, 8x10 Discharge Instruction: Apply over primary dressing as directed. Secured With Compression Wrap Kerlix Roll 4.5x3.1 (in/yd) Discharge Instruction: Apply Kerlix and Coban compression as directed. Coban Self-Adherent Wrap 4x5 (in/yd) Discharge Instruction: Apply over Kerlix as directed. Compression Stockings Add-Ons Electronic Signature(s) Signed: 07/21/2021 4:59:20 PM By: Lorrin Jackson Entered By: Lorrin Jackson on 07/21/2021 14:21:31 -------------------------------------------------------------------------------- Wound Assessment Details Patient Name: Date of Service: [name], [name] 07/21/2021 2:00 PM Medical Record Number: [id number] Patient Account Number: [id number] Date of Birth/Sex: Treating RN: 1936/08/21 (85 y.o. [name] Primary Care Lateef Juncaj: [name] Other Clinician: Referring Allexa Acoff: Treating Davanee Klinkner/Extender: Guadalupe Maple, Karrar Weeks in Treatment: 0 Wound Status Wound Number: 7 Primary Diabetic Wound/Ulcer of the Lower Extremity Etiology: Wound Location: Right, Anterior Lower Leg Secondary Lymphedema Wounding Event: Blister Etiology: Date Acquired: 07/21/2021 Wound Open Weeks Of Treatment: 0 Status: Clustered Wound: No Comorbid Cataracts, Glaucoma, Chronic Obstructive Pulmonary Disease History: (COPD), Arrhythmia, Congestive  Heart Failure, Coronary Artery Disease, Hypertension, Type II Diabetes, Gout, Neuropathy Wound Measurements Length: (cm) 1.5 Width: (cm) 1.2 Depth: (cm) 0.1 Area: (cm) 1.414 Volume: (cm) 0.141 % Reduction in Area: % Reduction in Volume: Epithelialization: Small (1-33%) Tunneling: No Undermining: No Wound Description Classification: Grade 2 Wound Margin: Distinct, outline attached Exudate Amount: Medium Exudate Type: Serosanguineous Exudate Color: red, brown Foul Odor After Cleansing: No Slough/Fibrino No Wound Bed Granulation Amount: Large (67-100%) Exposed Structure Granulation Quality: Red Fascia Exposed: No Necrotic Amount: None Present (0%) Fat Layer (Subcutaneous Tissue) Exposed: Yes Tendon Exposed: No Muscle Exposed: No Joint Exposed: No Bone Exposed: No Treatment Notes Wound #7 (Lower Leg) Wound Laterality: Right, Anterior Cleanser Peri-Wound Care Triamcinolone 15 (g) Discharge Instruction: Use triamcinolone 15 (g) mixed with lotion to lower legs Sween Lotion (Moisturizing lotion) Discharge Instruction: Apply moisturizing lotion as directed Topical Primary Dressing KerraCel Ag Gelling Fiber Dressing, 2x2 in (silver alginate) Discharge Instruction: Apply silver alginate to wound bed as instructed Secondary Dressing ABD Pad, 8x10 Discharge Instruction: Apply over primary dressing as directed. Secured With Compression Wrap Kerlix Roll 4.5x3.1 (in/yd) Discharge Instruction: Apply Kerlix and Coban compression as directed. Coban Self-Adherent Wrap 4x5 (in/yd) Discharge Instruction: Apply over Kerlix as directed. Compression Stockings Add-Ons Electronic Signature(s) Signed: 07/21/2021 4:59:20 PM By: Lorrin Jackson Entered By: Lorrin Jackson on 07/21/2021 14:22:28 -------------------------------------------------------------------------------- Wound Assessment Details Patient Name: Date of Service: [name], [name] 07/21/2021 2:00 PM Medical Record  Number: [id number] Patient Account Number: [id number] Date of Birth/Sex: Treating RN: [birth year] (85 y.o. [name] Primary Care Darnette Lampron: [name] Other Clinician: Referring Cenia Zaragosa: Treating Meigan Pates/Extender: Guadalupe Maple, Karrar Weeks in Treatment: 0 Wound Status Wound Number: 8 Primary Diabetic Wound/Ulcer of the Lower Extremity Etiology: Wound Location: Right, Medial Lower Leg Secondary Lymphedema Wounding Event: Blister Etiology: Date Acquired: 07/21/2021 Wound Open Weeks Of Treatment: 0 Status: Clustered Wound: No Comorbid Cataracts, Glaucoma, Chronic Obstructive Pulmonary Disease History: (COPD), Arrhythmia, Congestive Heart Failure, Coronary  Artery Disease, Hypertension, Type II Diabetes, Gout, Neuropathy Photos Wound Measurements Length: (cm) 0.5 Width: (cm) 0.3 Depth: (cm) 0.2 Area: (cm) 0.118 Volume: (cm) 0.024 % Reduction in Area: 0% % Reduction in Volume: 0% Epithelialization: None Tunneling: No Undermining: No Wound Description Classification: Grade 2 Wound Margin: Distinct, outline attached Exudate Amount: Medium Exudate Type: Serosanguineous Exudate Color: red, brown Foul Odor After Cleansing: No Slough/Fibrino Yes Wound Bed Granulation Amount: Small (1-33%) Exposed Structure Granulation Quality: Pink, Pale Fascia Exposed: No Necrotic Amount: Large (67-100%) Fat Layer (Subcutaneous Tissue) Exposed: Yes Necrotic Quality: Adherent Slough Tendon Exposed: No Muscle Exposed: No Joint Exposed: No Bone Exposed: No Treatment Notes Wound #8 (Lower Leg) Wound Laterality: Right, Medial Cleanser Peri-Wound Care Triamcinolone 15 (g) Discharge Instruction: Use triamcinolone 15 (g) mixed with lotion to lower legs Sween Lotion (Moisturizing lotion) Discharge Instruction: Apply moisturizing lotion as directed Topical Primary Dressing KerraCel Ag Gelling Fiber Dressing, 2x2 in (silver alginate) Discharge Instruction: Apply  silver alginate to wound bed as instructed Secondary Dressing ABD Pad, 8x10 Discharge Instruction: Apply over primary dressing as directed. Secured With Compression Wrap Kerlix Roll 4.5x3.1 (in/yd) Discharge Instruction: Apply Kerlix and Coban compression as directed. Coban Self-Adherent Wrap 4x5 (in/yd) Discharge Instruction: Apply over Kerlix as directed. Compression Stockings Add-Ons Electronic Signature(s) Signed: 07/21/2021 3:00:15 PM By: Rhae Hammock RN Signed: 07/21/2021 5:48:49 PM By: Deon Pilling RN, BSN Entered By: Rhae Hammock on 07/21/2021 14:51:17 -------------------------------------------------------------------------------- Vitals Details Patient Name: Date of Service: [name], [name] 07/21/2021 2:00 PM Medical Record Number: [id number] Patient Account Number: [id number] Date of Birth/Sex: Treating RN: October 15, 1935 (85 y.o. [name] Primary Care Jolyne Laye: [name] Other Clinician: Referring Esaiah Wanless: Treating Asjia Berrios/Extender: Guadalupe Maple, Karrar Weeks in Treatment: 0 Vital Signs Time Taken: 14:03 Temperature (F): 98.4 Height (in): 69 Pulse (bpm): 97 Weight (lbs): 185 Respiratory Rate (breaths/min): 18 Body Mass Index (BMI): 27.3 Blood Pressure (mmHg): 145/84 Capillary Blood Glucose (mg/dl): 126 Reference Range: 80 - 120 mg / dl Electronic Signature(s) Signed: 07/21/2021 4:59:20 PM By: Lorrin Jackson Entered By: Lorrin Jackson on 07/21/2021 14:03:31

## 2021-07-21 NOTE — Patient Instructions (Signed)
Description   Continue same dosage 2 tablets (4mg ) daily except 3 tablets (6mg ) on Mondays. Recheck INR in 5 weeks. [facility] [phone].

## 2021-07-28 ENCOUNTER — Encounter (HOSPITAL_BASED_OUTPATIENT_CLINIC_OR_DEPARTMENT_OTHER): Payer: Medicare Other | Admitting: Physician Assistant

## 2021-07-28 ENCOUNTER — Other Ambulatory Visit: Payer: Self-pay

## 2021-07-28 DIAGNOSIS — L97818 Non-pressure chronic ulcer of other part of right lower leg with other specified severity: Secondary | ICD-10-CM | POA: Diagnosis not present

## 2021-07-28 DIAGNOSIS — E1140 Type 2 diabetes mellitus with diabetic neuropathy, unspecified: Secondary | ICD-10-CM | POA: Diagnosis not present

## 2021-07-28 DIAGNOSIS — L97828 Non-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 leg with other specified severity: Secondary | ICD-10-CM | POA: Diagnosis not present

## 2021-07-28 DIAGNOSIS — I87333 Chronic venous hypertension (idiopathic) with ulcer and inflammation of bilateral lower extremity: Secondary | ICD-10-CM | POA: Diagnosis not present

## 2021-07-28 DIAGNOSIS — I110 Hypertensive heart disease with heart failure: Secondary | ICD-10-CM | POA: Diagnosis not present

## 2021-07-28 DIAGNOSIS — I509 Heart failure, unspecified: Secondary | ICD-10-CM | POA: Diagnosis not present

## 2021-07-28 DIAGNOSIS — E1151 Type 2 diabetes mellitus with diabetic peripheral angiopathy without gangrene: Secondary | ICD-10-CM | POA: Diagnosis not present

## 2021-07-28 DIAGNOSIS — E1136 Type 2 diabetes mellitus with diabetic cataract: Secondary | ICD-10-CM | POA: Diagnosis not present

## 2021-07-28 DIAGNOSIS — I4819 Other persistent atrial fibrillation: Secondary | ICD-10-CM | POA: Diagnosis not present

## 2021-07-28 DIAGNOSIS — L97812 Non-pressure chronic ulcer of other part of right lower leg with fat layer exposed: Secondary | ICD-10-CM | POA: Diagnosis not present

## 2021-07-28 DIAGNOSIS — Z7901 Long term (current) use of anticoagulants: Secondary | ICD-10-CM | POA: Diagnosis not present

## 2021-07-28 DIAGNOSIS — Z8673 Personal history of transient ischemic attack (TIA), and cerebral infarction without residual deficits: Secondary | ICD-10-CM | POA: Diagnosis not present

## 2021-07-28 DIAGNOSIS — L97822 Non-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 leg with fat layer exposed: Secondary | ICD-10-CM | POA: Diagnosis not present

## 2021-07-28 DIAGNOSIS — E11622 Type 2 diabetes mellitus with other skin ulcer: Secondary | ICD-10-CM | POA: Diagnosis not present

## 2021-07-28 DIAGNOSIS — Z950 Presence of cardiac pacemaker: Secondary | ICD-10-CM | POA: Diagnosis not present

## 2021-07-28 NOTE — Progress Notes (Signed)
Marzan, Telvin M. ([id number]) Visit Report for 07/28/2021 Arrival Information Details Patient Name: Date of Service: [name] ID M. 07/28/2021 3:30 PM Medical Record Number: [id number] Patient Account Number: [id number] Date of Birth/Sex: Treating RN: 04/14/1936 (85 y.o. [name] Primary Care Avalynn Bowe: [name] Other Clinician: Referring Melana Hingle: Treating Jakeia Carreras/Extender: Guadalupe Maple, Karrar Weeks in Treatment: 1 Visit Information History Since Last Visit Added or deleted any medications: No Patient Arrived: Ambulatory Any new allergies or adverse reactions: No Arrival Time: 15:41 Had a fall or experienced change in No Accompanied By: self activities of daily living that may affect Transfer Assistance: None risk of falls: Patient Identification Verified: Yes Signs or symptoms of abuse/neglect since last visito No Secondary Verification Process Completed: Yes Hospitalized since last visit: No Patient Requires Transmission-Based Precautions: No Implantable device outside of the clinic excluding No Patient Has Alerts: Yes cellular tissue based products placed in the center Patient Alerts: Patient on Blood Thinner since last visit: R ABI = 1.43 TBI= .39 Has Dressing in Place as Prescribed: Yes L ABI = 1.85 TBI= .6 Has Compression in Place as Prescribed: Yes Pain Present Now: Yes Electronic Signature(s) Signed: 07/28/2021 4:52:58 PM By: Baruch Gouty RN, BSN Entered By: Baruch Gouty on 07/28/2021 15:42:16 -------------------------------------------------------------------------------- Clinic Level of Care Assessment Details Patient Name: Date of Service: [name], [name] 07/28/2021 3:30 PM Medical Record Number: [id number] Patient Account Number: [id number] Date of Birth/Sex: Treating RN: July 29, 1936 (85 y.o. [name] Primary Care Kecia Swoboda: [name] Other Clinician: Referring Tyona Nilsen: Treating Gevork Ayyad/Extender: Guadalupe Maple, Karrar Weeks in Treatment: 1 Clinic Level of Care Assessment Items TOOL 4 Quantity Score []  - 0 Use when only an EandM is performed on FOLLOW-UP visit ASSESSMENTS - Nursing Assessment / Reassessment X- 1 10 Reassessment of Co-morbidities (includes updates in patient status) X- 1 5 Reassessment of Adherence to Treatment Plan ASSESSMENTS - Wound and Skin A ssessment / Reassessment []  - 0 Simple Wound Assessment / Reassessment - one wound X- 8 5 Complex Wound Assessment / Reassessment - multiple wounds []  - 0 Dermatologic / Skin Assessment (not related to wound area) ASSESSMENTS - Focused Assessment X- 2 5 Circumferential Edema Measurements - multi extremities []  - 0 Nutritional Assessment / Counseling / Intervention X- 1 5 Lower Extremity Assessment (monofilament, tuning fork, pulses) []  - 0 Peripheral Arterial Disease Assessment (using hand held doppler) ASSESSMENTS - Ostomy and/or Continence Assessment and Care []  - 0 Incontinence Assessment and Management []  - 0 Ostomy Care Assessment and Management (repouching, etc.) PROCESS - Coordination of Care X - Simple Patient / Family Education for ongoing care 1 15 []  - 0 Complex (extensive) Patient / Family Education for ongoing care X- 1 10 Staff obtains Programmer, systems, Records, T Results / Process Orders est []  - 0 Staff telephones HHA, Nursing Homes / Clarify orders / etc []  - 0 Routine Transfer to another Facility (non-emergent condition) []  - 0 Routine Hospital Admission (non-emergent condition) []  - 0 New Admissions / Biomedical engineer / Ordering NPWT Apligraf, etc. , []  - 0 Emergency Hospital Admission (emergent condition) X- 1 10 Simple Discharge Coordination []  - 0 Complex (extensive) Discharge Coordination PROCESS - Special Needs []  - 0 Pediatric / Minor Patient Management []  - 0 Isolation Patient Management []  - 0 Hearing / Language / Visual special needs []  - 0 Assessment of Community  assistance (transportation, D/C planning, etc.) []  - 0 Additional assistance / Altered mentation []  - 0 Support Surface(s)  Assessment (bed, cushion, seat, etc.) INTERVENTIONS - Wound Cleansing / Measurement []  - 0 Simple Wound Cleansing - one wound X- 8 5 Complex Wound Cleansing - multiple wounds X- 1 5 Wound Imaging (photographs - any number of wounds) []  - 0 Wound Tracing (instead of photographs) []  - 0 Simple Wound Measurement - one wound X- 8 5 Complex Wound Measurement - multiple wounds INTERVENTIONS - Wound Dressings []  - 0 Small Wound Dressing one or multiple wounds X- 2 15 Medium Wound Dressing one or multiple wounds []  - 0 Large Wound Dressing one or multiple wounds []  - 0 Application of Medications - topical X- 1 10 Application of Medications - injection INTERVENTIONS - Miscellaneous []  - 0 External ear exam []  - 0 Specimen Collection (cultures, biopsies, blood, body fluids, etc.) []  - 0 Specimen(s) / Culture(s) sent or taken to Lab for analysis []  - 0 Patient Transfer (multiple staff / Civil Service fast streamer / Similar devices) []  - 0 Simple Staple / Suture removal (25 or less) []  - 0 Complex Staple / Suture removal (26 or more) []  - 0 Hypo / Hyperglycemic Management (close monitor of Blood Glucose) []  - 0 Ankle / Brachial Index (ABI) - do not check if billed separately X- 1 5 Vital Signs Has the patient been seen at the hospital within the last three years: Yes Total Score: 235 Level Of Care: New/Established - Level 5 Electronic Signature(s) Signed: 07/28/2021 4:52:58 PM By: Baruch Gouty RN, BSN Entered By: Baruch Gouty on 07/28/2021 16:31:14 -------------------------------------------------------------------------------- Encounter Discharge Information Details Patient Name: Date of Service: [name], [name] 07/28/2021 3:30 PM Medical Record Number: [id number] Patient Account Number: [id number] Date of Birth/Sex: Treating RN: Apr 10, 1936 (85 y.o. [name] Primary Care Leopoldo Mazzie: [name] Other Clinician: Referring Tamara Kenyon: Treating Shaneen Reeser/Extender: Guadalupe Maple, Karrar Weeks in Treatment: 1 Encounter Discharge Information Items Discharge Condition: Stable Ambulatory Status: Ambulatory Discharge Destination: Home Transportation: Private Auto Accompanied By: self Schedule Follow-up Appointment: Yes Clinical Summary of Care: Patient Declined Electronic Signature(s) Signed: 07/28/2021 4:52:58 PM By: Baruch Gouty RN, BSN Entered By: Baruch Gouty on 07/28/2021 16:30:28 -------------------------------------------------------------------------------- Lower Extremity Assessment Details Patient Name: Date of Service: [name], [name] 07/28/2021 3:30 PM Medical Record Number: [id number] Patient Account Number: [id number] Date of Birth/Sex: Treating RN: 21-May-1936 (85 y.o. [name] Primary Care Joelynn Dust: [name] Other Clinician: Referring Sony Schlarb: Treating Ruari Duggan/Extender: Guadalupe Maple, Karrar Weeks in Treatment: 1 Edema Assessment Assessed: [Left: No] [Right: No] Edema: [Left: Yes] [Right: Yes] Calf Left: Right: Point of Measurement: From Medial Instep 33.8 cm 36 cm Ankle Left: Right: Point of Measurement: From Medial Instep 24 cm 23 cm Vascular Assessment Pulses: Dorsalis Pedis Palpable: [Left:No] [Right:No] Electronic Signature(s) Signed: 07/28/2021 4:52:58 PM By: Baruch Gouty RN, BSN Entered By: Baruch Gouty on 07/28/2021 15:55:06 -------------------------------------------------------------------------------- [place] Details Patient Name: Date of Service: [name], [name] 07/28/2021 3:30 PM Medical Record Number: [id number] Patient Account Number: [id number] Date of Birth/Sex: Treating RN: 1935/10/16 (85 y.o. [name] Primary Care Marge Vandermeulen: [name] Other Clinician: Referring Kadince Boxley: Treating  Icie Kuznicki/Extender: Veatrice Bourbon in Treatment: 1 Power reviewed with physician Active Inactive Nutrition Nursing Diagnoses: Impaired glucose control: actual or potential Potential for alteratiion in Nutrition/Potential for imbalanced nutrition Goals: Patient/caregiver will maintain therapeutic glucose control Date Initiated: 07/15/2021 Target Resolution Date: 08/12/2021 Goal Status: Active Interventions: Assess HgA1c results as ordered upon admission and as needed Assess patient  nutrition upon admission and as needed per policy Provide education on elevated blood sugars and impact on wound healing Treatment Activities: Dietary management education, guidance and counseling : 07/15/2021 Giving encouragement to exercise : 07/15/2021 Patient referred to Primary Care Physician for further nutritional evaluation : 07/15/2021 Notes: Venous Leg Ulcer Nursing Diagnoses: Knowledge deficit related to disease process and management Potential for venous Insuffiency (use before diagnosis confirmed) Goals: Patient will maintain optimal edema control Date Initiated: 07/15/2021 Target Resolution Date: 08/12/2021 Goal Status: Active Interventions: Assess peripheral edema status every visit. Compression as ordered Provide education on venous insufficiency Treatment Activities: Therapeutic compression applied : 07/15/2021 Notes: Wound/Skin Impairment Nursing Diagnoses: Impaired tissue integrity Knowledge deficit related to ulceration/compromised skin integrity Goals: Patient/caregiver will verbalize understanding of skin care regimen Date Initiated: 07/15/2021 Target Resolution Date: 08/12/2021 Goal Status: Active Ulcer/skin breakdown will have a volume reduction of 30% by week 4 Date Initiated: 07/15/2021 Target Resolution Date: 08/12/2021 Goal Status: Active Interventions: Assess patient/caregiver ability to obtain necessary  supplies Assess patient/caregiver ability to perform ulcer/skin care regimen upon admission and as needed Assess ulceration(s) every visit Provide education on ulcer and skin care Treatment Activities: Skin care regimen initiated : 07/15/2021 Topical wound management initiated : 07/15/2021 Notes: Electronic Signature(s) Signed: 07/28/2021 4:52:58 PM By: Baruch Gouty RN, BSN Entered By: Baruch Gouty on 07/28/2021 16:06:33 -------------------------------------------------------------------------------- Pain Assessment Details Patient Name: Date of Service: [name], Shaune Pascal ID M. 07/28/2021 3:30 PM Medical Record Number: [id number] Patient Account Number: [id number] Date of Birth/Sex: Treating RN: 17-Apr-1936 (85 y.o. [name] Primary Care Mc Hollen: [name] Other Clinician: Referring Shamir Sedlar: Treating Riccardo Holeman/Extender: Guadalupe Maple, Karrar Weeks in Treatment: 1 Active Problems Location of Pain Severity and Description of Pain Patient Has Paino Yes Site Locations Pain Location: Pain in Ulcers With Dressing Change: No Duration of the Pain. Constant / Intermittento Intermittent Rate the pain. Current Pain Level: 0 Worst Pain Level: 4 Least Pain Level: 0 Character of Pain Describe the Pain: Other: stinging Pain Management and Medication Current Pain Management: Medication: No Cold Application: No Rest: No Massage: No Activity: No T.E.N.S.: No Heat Application: No Leg drop or elevation: No Other: time Is the Current Pain Management Adequate: Adequate How does your wound impact your activities of daily livingo Sleep: No Bathing: No Appetite: No Relationship With Others: No Bladder Continence: No Emotions: No Bowel Continence: No Work: No Toileting: No Drive: No Dressing: No Hobbies: No Engineer, maintenance) Signed: 07/28/2021 4:52:58 PM By: Baruch Gouty RN, BSN Entered By: Baruch Gouty on 07/28/2021  15:53:50 -------------------------------------------------------------------------------- Patient/Caregiver Education Details Patient Name: Date of Service: DA Burgess Estelle 11/30/2022andnbsp3:30 PM Medical Record Number: [id number] Patient Account Number: [id number] Date of Birth/Gender: Treating RN: [birth year] (85 y.o. [name] Primary Care Physician: [name] Other Clinician: Referring Physician: Treating Physician/Extender: Guadalupe Maple, Fransisca Connors in Treatment: 1 Education Assessment Education Provided To: Patient Education Topics Provided Venous: Methods: Explain/Verbal Responses: Reinforcements needed, State content correctly Wound/Skin Impairment: Methods: Explain/Verbal Responses: Reinforcements needed, State content correctly Electronic Signature(s) Signed: 07/28/2021 4:52:58 PM By: Baruch Gouty RN, BSN Entered By: Baruch Gouty on 07/28/2021 16:07:30 -------------------------------------------------------------------------------- Wound Assessment Details Patient Name: Date of Service: [name], Shaune Pascal ID M. 07/28/2021 3:30 PM Medical Record Number: [id number] Patient Account Number: [id number] Date of Birth/Sex: Treating RN: June 02, 1936 (85 y.o. [name] Primary Care Sebastin Perlmutter: [name] Other Clinician: Referring Madeline Bebout: Treating Azahel Belcastro/Extender: Guadalupe Maple, Karrar Weeks in Treatment: 1 Wound Status Wound Number:  1 Primary Diabetic Wound/Ulcer of the Lower Extremity Etiology: Wound Location: Right, Distal, Anterior Lower Leg Secondary Venous Leg Ulcer Wounding Event: Gradually Appeared Etiology: Date Acquired: 04/29/2021 Wound Open Weeks Of Treatment: 1 Status: Clustered Wound: No Comorbid Cataracts, Glaucoma, Chronic Obstructive Pulmonary Disease History: (COPD), Arrhythmia, Congestive Heart Failure, Coronary Artery Disease, Hypertension, Type II Diabetes, Gout, Neuropathy Photos Wound  Measurements Length: (cm) 1.7 Width: (cm) 1 Depth: (cm) 0.2 Area: (cm) 1.335 Volume: (cm) 0.267 % Reduction in Area: 79.8% % Reduction in Volume: 59.5% Epithelialization: None Tunneling: No Undermining: No Wound Description Classification: Grade 1 Wound Margin: Flat and Intact Exudate Amount: Medium Exudate Type: Serosanguineous Exudate Color: red, brown Foul Odor After Cleansing: No Slough/Fibrino Yes Wound Bed Granulation Amount: Medium (34-66%) Exposed Structure Granulation Quality: Red Fascia Exposed: No Necrotic Amount: Medium (34-66%) Fat Layer (Subcutaneous Tissue) Exposed: Yes Necrotic Quality: Adherent Slough Tendon Exposed: No Muscle Exposed: No Joint Exposed: No Bone Exposed: No Treatment Notes Wound #1 (Lower Leg) Wound Laterality: Right, Anterior, Distal Cleanser Peri-Wound Care Triamcinolone 15 (g) Discharge Instruction: Use triamcinolone 15 (g) mixed with lotion to lower legs Sween Lotion (Moisturizing lotion) Discharge Instruction: Apply moisturizing lotion as directed Topical Primary Dressing KerraCel Ag Gelling Fiber Dressing, 2x2 in (silver alginate) Discharge Instruction: Apply silver alginate to wound bed as instructed Secondary Dressing Woven Gauze Sponge, Non-Sterile 4x4 in Discharge Instruction: Apply over primary dressing as directed. Secured With Compression Wrap Kerlix Roll 4.5x3.1 (in/yd) Discharge Instruction: Apply Kerlix and Coban compression as directed. Coban Self-Adherent Wrap 4x5 (in/yd) Discharge Instruction: Apply over Kerlix as directed. Compression Stockings Add-Ons Electronic Signature(s) Signed: 07/28/2021 4:52:58 PM By: Baruch Gouty RN, BSN Entered By: Baruch Gouty on 07/28/2021 16:03:23 -------------------------------------------------------------------------------- Wound Assessment Details Patient Name: Date of Service: [name], [name] 07/28/2021 3:30 PM Medical Record Number: [id number] Patient  Account Number: [id number] Date of Birth/Sex: Treating RN: 12-05-1935 (85 y.o. [name] Primary Care Adren Dollins: [name] Other Clinician: Referring Ammi Hutt: Treating Pernie Grosso/Extender: Guadalupe Maple, Karrar Weeks in Treatment: 1 Wound Status Wound Number: 2 Primary Diabetic Wound/Ulcer of the Lower Extremity Etiology: Wound Location: Right, Proximal, Anterior Lower Leg Secondary Venous Leg Ulcer Wounding Event: Blister Etiology: Date Acquired: 04/29/2021 Wound Open Weeks Of Treatment: 1 Status: Clustered Wound: No Comorbid Cataracts, Glaucoma, Chronic Obstructive Pulmonary Disease History: (COPD), Arrhythmia, Congestive Heart Failure, Coronary Artery Disease, Hypertension, Type II Diabetes, Gout, Neuropathy Photos Wound Measurements Length: (cm) Width: (cm) Depth: (cm) Area: (cm) Volume: (cm) 0 % Reduction in Area: 100% 0 % Reduction in Volume: 100% 0 Epithelialization: Large (67-100%) 0 Tunneling: No 0 Undermining: No Wound Description Classification: Grade 1 Exudate Amount: None Present Foul Odor After Cleansing: No Slough/Fibrino No Wound Bed Granulation Amount: None Present (0%) Exposed Structure Necrotic Amount: None Present (0%) Fascia Exposed: No Fat Layer (Subcutaneous Tissue) Exposed: No Tendon Exposed: No Muscle Exposed: No Joint Exposed: No Bone Exposed: No Electronic Signature(s) Signed: 07/28/2021 4:52:58 PM By: Baruch Gouty RN, BSN Entered By: Baruch Gouty on 07/28/2021 16:04:20 -------------------------------------------------------------------------------- Wound Assessment Details Patient Name: Date of Service: [name], Shaune Pascal ID M. 07/28/2021 3:30 PM Medical Record Number: [id number] Patient Account Number: [id number] Date of Birth/Sex: Treating RN: 09-Feb-1936 (85 y.o. [name] Primary Care Svea Pusch: [name] Other Clinician: Referring Sharmel Ballantine: Treating Abbagail Scaff/Extender: Guadalupe Maple, Karrar Weeks in Treatment: 1 Wound Status Wound Number: 3 Primary Diabetic Wound/Ulcer of the Lower Extremity Etiology: Wound Location: Right, Lateral Lower Leg Secondary Venous Leg Ulcer Wounding Event: Blister Etiology:  Date Acquired: 04/29/2021 Wound Open Weeks Of Treatment: 1 Status: Clustered Wound: No Comorbid Cataracts, Glaucoma, Chronic Obstructive Pulmonary Disease History: (COPD), Arrhythmia, Congestive Heart Failure, Coronary Artery Disease, Hypertension, Type II Diabetes, Gout, Neuropathy Photos Wound Measurements Length: (cm) 1.7 Width: (cm) 1.2 Depth: (cm) 0.1 Area: (cm) 1.602 Volume: (cm) 0.16 % Reduction in Area: -20.7% % Reduction in Volume: -20.3% Epithelialization: Large (67-100%) Tunneling: No Undermining: No Wound Description Classification: Grade 1 Wound Margin: Flat and Intact Exudate Amount: Medium Exudate Type: Serous Exudate Color: amber Foul Odor After Cleansing: No Slough/Fibrino No Wound Bed Granulation Amount: Large (67-100%) Exposed Structure Granulation Quality: Red Fascia Exposed: No Necrotic Amount: None Present (0%) Fat Layer (Subcutaneous Tissue) Exposed: Yes Tendon Exposed: No Muscle Exposed: No Joint Exposed: No Bone Exposed: No Treatment Notes Wound #3 (Lower Leg) Wound Laterality: Right, Lateral Cleanser Peri-Wound Care Triamcinolone 15 (g) Discharge Instruction: Use triamcinolone 15 (g) mixed with lotion to lower legs Sween Lotion (Moisturizing lotion) Discharge Instruction: Apply moisturizing lotion as directed Topical Primary Dressing KerraCel Ag Gelling Fiber Dressing, 2x2 in (silver alginate) Discharge Instruction: Apply silver alginate to wound bed as instructed Secondary Dressing Woven Gauze Sponge, Non-Sterile 4x4 in Discharge Instruction: Apply over primary dressing as directed. Secured With Compression Wrap Kerlix Roll 4.5x3.1 (in/yd) Discharge Instruction: Apply Kerlix and Coban  compression as directed. Coban Self-Adherent Wrap 4x5 (in/yd) Discharge Instruction: Apply over Kerlix as directed. Compression Stockings Add-Ons Electronic Signature(s) Signed: 07/28/2021 4:52:58 PM By: Baruch Gouty RN, BSN Entered By: Baruch Gouty on 07/28/2021 16:05:05 -------------------------------------------------------------------------------- Wound Assessment Details Patient Name: Date of Service: [name], [name] 07/28/2021 3:30 PM Medical Record Number: [id number] Patient Account Number: [id number] Date of Birth/Sex: Treating RN: 26-Mar-1936 (85 y.o. [name] Primary Care Ivette Castronova: [name] Other Clinician: Referring Riane Rung: Treating Joron Velis/Extender: Guadalupe Maple, Karrar Weeks in Treatment: 1 Wound Status Wound Number: 4 Primary Diabetic Wound/Ulcer of the Lower Extremity Etiology: Wound Location: Left, Anterior Lower Leg Secondary Venous Leg Ulcer Wounding Event: Blister Etiology: Date Acquired: 04/29/2021 Wound Open Weeks Of Treatment: 1 Status: Clustered Wound: No Comorbid Cataracts, Glaucoma, Chronic Obstructive Pulmonary Disease History: (COPD), Arrhythmia, Congestive Heart Failure, Coronary Artery Disease, Hypertension, Type II Diabetes, Gout, Neuropathy Photos Wound Measurements Length: (cm) 1.6 Width: (cm) 1.7 Depth: (cm) 0.1 Area: (cm) 2.136 Volume: (cm) 0.214 % Reduction in Area: -240.1% % Reduction in Volume: -239.7% Epithelialization: Small (1-33%) Tunneling: No Undermining: No Wound Description Classification: Grade 1 Wound Margin: Flat and Intact Exudate Amount: Medium Exudate Type: Serous Exudate Color: amber Foul Odor After Cleansing: No Slough/Fibrino No Wound Bed Granulation Amount: Large (67-100%) Exposed Structure Granulation Quality: Red Fascia Exposed: No Necrotic Amount: None Present (0%) Fat Layer (Subcutaneous Tissue) Exposed: Yes Tendon Exposed: No Muscle Exposed: No Joint  Exposed: No Bone Exposed: No Treatment Notes Wound #4 (Lower Leg) Wound Laterality: Left, Anterior Cleanser Peri-Wound Care Triamcinolone 15 (g) Discharge Instruction: Use triamcinolone 15 (g) mixed with lotion to lower legs Sween Lotion (Moisturizing lotion) Discharge Instruction: Apply moisturizing lotion as directed Topical Primary Dressing KerraCel Ag Gelling Fiber Dressing, 2x2 in (silver alginate) Discharge Instruction: Apply silver alginate to wound bed as instructed Secondary Dressing Woven Gauze Sponge, Non-Sterile 4x4 in Discharge Instruction: Apply over primary dressing as directed. Secured With Compression Wrap Kerlix Roll 4.5x3.1 (in/yd) Discharge Instruction: Apply Kerlix and Coban compression as directed. Coban Self-Adherent Wrap 4x5 (in/yd) Discharge Instruction: Apply over Kerlix as directed. Compression Stockings Add-Ons Electronic Signature(s) Signed: 07/28/2021 4:52:58 PM By: Baruch Gouty RN, BSN Entered  By: Baruch Gouty on 07/28/2021 16:06:19 -------------------------------------------------------------------------------- Wound Assessment Details Patient Name: Date of Service: [name] ID M. 07/28/2021 3:30 PM Medical Record Number: [id number] Patient Account Number: [id number] Date of Birth/Sex: Treating RN: 08/16/1936 (85 y.o. [name] Primary Care Giovani Neumeister: [name] Other Clinician: Referring Rashauna Tep: Treating Chuckie Mccathern/Extender: Guadalupe Maple, Karrar Weeks in Treatment: 1 Wound Status Wound Number: 5 Primary Diabetic Wound/Ulcer of the Lower Extremity Etiology: Wound Location: Left, Lateral Lower Leg Secondary Venous Leg Ulcer Wounding Event: Blister Etiology: Date Acquired: 04/29/2021 Wound Healed - Epithelialized Weeks Of Treatment: 1 Status: Clustered Wound: No Comorbid Cataracts, Glaucoma, Chronic Obstructive Pulmonary Disease History: (COPD), Arrhythmia, Congestive Heart Failure, Coronary  Artery Disease, Hypertension, Type II Diabetes, Gout, Neuropathy Photos Wound Measurements Length: (cm) Width: (cm) Depth: (cm) Area: (cm) Volume: (cm) 0 % Reduction in Area: 100% 0 % Reduction in Volume: 100% 0 Epithelialization: Large (67-100%) 0 Tunneling: No 0 Undermining: No Wound Description Classification: Grade 1 Exudate Amount: None Present Foul Odor After Cleansing: No Slough/Fibrino No Wound Bed Granulation Amount: None Present (0%) Exposed Structure Necrotic Amount: None Present (0%) Fascia Exposed: No Fat Layer (Subcutaneous Tissue) Exposed: No Tendon Exposed: No Muscle Exposed: No Joint Exposed: No Bone Exposed: No Electronic Signature(s) Signed: 07/28/2021 4:52:58 PM By: Baruch Gouty RN, BSN Entered By: Baruch Gouty on 07/28/2021 16:07:17 -------------------------------------------------------------------------------- Wound Assessment Details Patient Name: Date of Service: [name], Shaune Pascal ID M. 07/28/2021 3:30 PM Medical Record Number: [id number] Patient Account Number: [id number] Date of Birth/Sex: Treating RN: 07/18/1936 (85 y.o. [name] Primary Care Mavis Fichera: [name] Other Clinician: Referring Maybelle Depaoli: Treating Joseguadalupe Stan/Extender: Guadalupe Maple, Karrar Weeks in Treatment: 1 Wound Status Wound Number: 6 Primary Diabetic Wound/Ulcer of the Lower Extremity Etiology: Wound Location: Left, Distal, Anterior Lower Leg Secondary Lymphedema Wounding Event: Blister Etiology: Date Acquired: 07/21/2021 Date Acquired: 07/21/2021 Wound Open Weeks Of Treatment: 1 Status: Clustered Wound: No Comorbid Cataracts, Glaucoma, Chronic Obstructive Pulmonary Disease History: (COPD), Arrhythmia, Congestive Heart Failure, Coronary Artery Disease, Hypertension, Type II Diabetes, Gout, Neuropathy Photos Wound Measurements Length: (cm) 2 Width: (cm) 2.5 Depth: (cm) 0.1 Area: (cm) 3.927 Volume: (cm) 0.393 % Reduction in Area:  -17.6% % Reduction in Volume: -17.7% Epithelialization: Small (1-33%) Tunneling: No Undermining: No Wound Description Classification: Grade 2 Wound Margin: Distinct, outline attached Exudate Amount: Medium Exudate Type: Serosanguineous Exudate Color: red, brown Foul Odor After Cleansing: No Slough/Fibrino Yes Wound Bed Granulation Amount: Large (67-100%) Exposed Structure Granulation Quality: Red Fascia Exposed: No Necrotic Amount: None Present (0%) Fat Layer (Subcutaneous Tissue) Exposed: Yes Tendon Exposed: No Muscle Exposed: No Joint Exposed: No Bone Exposed: No Treatment Notes Wound #6 (Lower Leg) Wound Laterality: Left, Anterior, Distal Cleanser Peri-Wound Care Triamcinolone 15 (g) Discharge Instruction: Use triamcinolone 15 (g) mixed with lotion to lower legs Sween Lotion (Moisturizing lotion) Discharge Instruction: Apply moisturizing lotion as directed Topical Primary Dressing KerraCel Ag Gelling Fiber Dressing, 2x2 in (silver alginate) Discharge Instruction: Apply silver alginate to wound bed as instructed Secondary Dressing Woven Gauze Sponge, Non-Sterile 4x4 in Discharge Instruction: Apply over primary dressing as directed. Secured With Compression Wrap Kerlix Roll 4.5x3.1 (in/yd) Discharge Instruction: Apply Kerlix and Coban compression as directed. Coban Self-Adherent Wrap 4x5 (in/yd) Discharge Instruction: Apply over Kerlix as directed. Compression Stockings Add-Ons Electronic Signature(s) Signed: 07/28/2021 4:52:58 PM By: Baruch Gouty RN, BSN Entered By: Baruch Gouty on 07/28/2021 16:08:04 -------------------------------------------------------------------------------- Wound Assessment Details Patient Name: Date of Service: [name], [name] 07/28/2021 3:30 PM Medical  Record Number: [id number] Patient Account Number: [id number] Date of Birth/Sex: Treating RN: Jan 02, 1936 (85 y.o. [name] Primary Care Raquel Sayres: [name] Other Clinician: Referring Orville Mena: Treating Ory Elting/Extender: Guadalupe Maple, Karrar Weeks in Treatment: 1 Wound Status Wound Number: 7 Primary Diabetic Wound/Ulcer of the Lower Extremity Etiology: Wound Location: Right, Anterior Lower Leg Secondary Lymphedema Wounding Event: Blister Etiology: Date Acquired: 07/21/2021 Wound Open Weeks Of Treatment: 1 Status: Clustered Wound: No Comorbid Cataracts, Glaucoma, Chronic Obstructive Pulmonary Disease History: (COPD), Arrhythmia, Congestive Heart Failure, Coronary Artery Disease, Hypertension, Type II Diabetes, Gout, Neuropathy Photos Wound Measurements Length: (cm) 1.2 Width: (cm) 1 Depth: (cm) 0.1 Area: (cm) 0.942 Volume: (cm) 0.094 % Reduction in Area: 33.4% % Reduction in Volume: 33.3% Epithelialization: Medium (34-66%) Tunneling: No Undermining: No Wound Description Classification: Grade 2 Wound Margin: Distinct, outline attached Exudate Amount: Medium Exudate Type: Serosanguineous Exudate Color: red, brown Foul Odor After Cleansing: No Slough/Fibrino No Wound Bed Granulation Amount: Large (67-100%) Exposed Structure Granulation Quality: Red Fascia Exposed: No Necrotic Amount: None Present (0%) Fat Layer (Subcutaneous Tissue) Exposed: Yes Tendon Exposed: No Muscle Exposed: No Joint Exposed: No Bone Exposed: No Treatment Notes Wound #7 (Lower Leg) Wound Laterality: Right, Anterior Cleanser Peri-Wound Care Triamcinolone 15 (g) Discharge Instruction: Use triamcinolone 15 (g) mixed with lotion to lower legs Sween Lotion (Moisturizing lotion) Discharge Instruction: Apply moisturizing lotion as directed Topical Primary Dressing KerraCel Ag Gelling Fiber Dressing, 2x2 in (silver alginate) Discharge Instruction: Apply silver alginate to wound bed as instructed Secondary Dressing Woven Gauze Sponge, Non-Sterile 4x4 in Discharge Instruction: Apply over primary dressing as directed. Secured  With Compression Wrap Kerlix Roll 4.5x3.1 (in/yd) Discharge Instruction: Apply Kerlix and Coban compression as directed. Coban Self-Adherent Wrap 4x5 (in/yd) Discharge Instruction: Apply over Kerlix as directed. Compression Stockings Add-Ons Electronic Signature(s) Signed: 07/28/2021 4:52:58 PM By: Baruch Gouty RN, BSN Entered By: Baruch Gouty on 07/28/2021 16:08:56 -------------------------------------------------------------------------------- Wound Assessment Details Patient Name: Date of Service: [name], [name] 07/28/2021 3:30 PM Medical Record Number: [id number] Patient Account Number: [id number] Date of Birth/Sex: Treating RN: [birth year] (85 y.o. [name] Primary Care Samantha Ragen: [name] Other Clinician: Referring Corderro Koloski: Treating Clemie General/Extender: Guadalupe Maple, Karrar Weeks in Treatment: 1 Wound Status Wound Number: 8 Primary Diabetic Wound/Ulcer of the Lower Extremity Etiology: Wound Location: Right, Medial Lower Leg Secondary Lymphedema Wounding Event: Blister Etiology: Date Acquired: 07/21/2021 Wound Healed - Epithelialized Weeks Of Treatment: 1 Status: Clustered Wound: No Comorbid Cataracts, Glaucoma, Chronic Obstructive Pulmonary Disease History: (COPD), Arrhythmia, Congestive Heart Failure, Coronary Artery Disease, Hypertension, Type II Diabetes, Gout, Neuropathy Photos Wound Measurements Length: (cm) Width: (cm) Depth: (cm) Area: (cm) Volume: (cm) 0 % Reduction in Area: 100% 0 % Reduction in Volume: 100% 0 Epithelialization: Large (67-100%) 0 Tunneling: No 0 Undermining: No Wound Description Classification: Grade 2 Exudate Amount: None Present Foul Odor After Cleansing: No Slough/Fibrino No Wound Bed Granulation Amount: None Present (0%) Exposed Structure Necrotic Amount: None Present (0%) Fascia Exposed: No Fat Layer (Subcutaneous Tissue) Exposed: No Tendon Exposed: No Muscle Exposed: No Joint  Exposed: No Bone Exposed: No Electronic Signature(s) Signed: 07/28/2021 4:52:58 PM By: Baruch Gouty RN, BSN Entered By: Baruch Gouty on 07/28/2021 16:09:46 -------------------------------------------------------------------------------- [place] Details Patient Name: Date of Service: [name], [name] 07/28/2021 3:30 PM Medical Record Number: [id number] Patient Account Number: [id number] Date of Birth/Sex: Treating RN: [birth year] (85 y.o. [name] Primary Care Kenijah [place]: [name] Other Clinician: Referring Jareth Pardee: Treating Shelitha Magley/Extender: Worthy Keeler  [name] Weeks in Treatment: 1 Vital Signs Time Taken: 15:43 Temperature (F): 98.2 Height (in): 69 Pulse (bpm): 83 Source: Stated Respiratory Rate (breaths/min): 18 Weight (lbs): 185 Blood Pressure (mmHg): 126/82 Source: Stated Capillary Blood Glucose (mg/dl): 120 Body Mass Index (BMI): 27.3 Reference Range: 80 - 120 mg / dl Notes glucose per pt report this am Electronic Signature(s) Signed: 07/28/2021 4:52:58 PM By: Baruch Gouty RN, BSN Entered By: Baruch Gouty on 07/28/2021 15:44:27

## 2021-07-28 NOTE — Progress Notes (Addendum)
Vincent [name], Vincent M. ([id number]) Visit Report for 07/28/2021 Chief Complaint Document Details Patient Name: Date of Service: [name] ID M. 07/28/2021 3:30 PM Medical Record Number: [id number] Patient Account Number: [id number] Date of Birth/Sex: Treating RN: 07/13/1936 (85 y.o. M) Primary Care Provider: Wenda [name] Other Clinician: Referring Provider: Treating Provider/Extender: Vincent [name], Vincent [name] in Treatment: 1 Information Obtained from: Patient Chief Complaint 07/15/2021 patient is here for wounds on his bilateral lower legs Electronic Signature(s) Signed: 07/28/2021 3:34:11 PM By: Vincent Keeler PA-C Entered By: Vincent [name] on 07/28/2021 15:34:11 -------------------------------------------------------------------------------- HPI Details Patient Name: Date of Service: [name], [name] 07/28/2021 3:30 PM Medical Record Number: [id number] Patient Account Number: [id number] Date of Birth/Sex: Treating RN: 01-19-1936 (85 y.o. M) Primary Care Provider: Wenda [name] Other Clinician: Referring Provider: Treating Provider/Extender: Vincent [name], Vincent [name] in Treatment: 1 History of Present Illness HPI Description: ADMISSION 07/15/2021 This is an 85 year old man who came here for evaluation of wounds on his bilateral predominantly anterior lower legs mid aspect of the tibia. He seems to have been dealing with this for most of this year. Currently with 3 wounds on the right and 2 on the left. At least some of these seem to start off as blisters probably the majority. He has been following with Dr. Anabel [name] of [facility] dermatology according the patient back he has an appointment Dr. Anabel [name] tomorrow he was given a prescription for triamcinolone which she has been applying daily in fact it was renewed yesterday. Patient is a diabetic no recent hemoglobin A1c. He is on Coumadin for persistent atrial fibrillation. Vein and vascular in December  2021 at which time he had bilateral foot pain and finger pain. ABIs bilaterally were noncompressible however the TBI on the right was only 0.39. There were biphasic waveforms on the left his ABI was noncompressible with biphasic waveforms but with a TBI of 0.6. He was not felt to have a primary vascular issue. There is a mention of Raynaud's phenomenon as well. Past medical history includes cellulitis of the right leg in June 2022, type 2 diabetes, persistent lower extremity edema, bilateral foot and ankle pain, Raynaud's phenomenon, coronary artery disease, congestive heart failure, pacemaker, none sustained ventricular tachycardia, remote CVA paroxysmal atrial fib on Coumadin. We did not reattempt ABIs in our clinic 07/22/2019 patient appears to be doing decently well in regard to his wounds as compared to last week with the wounds that were present last week. Fortunately I do not see any signs of active infection which is great news. No fevers, chills, nausea, vomiting, or diarrhea. With that being said he has several new blisters that are getting need to be addressed today. 07/28/2021 upon evaluation today patient actually appears to be making excellent progress. I am extremely pleased with where we stand today. I think that his wounds are significantly improved compared to where they were previous. Electronic Signature(s) Signed: 07/28/2021 4:26:15 PM By: Vincent Keeler PA-C Signed: 07/28/2021 4:26:15 PM By: Vincent Keeler PA-C Entered By: Vincent [name] on 07/28/2021 16:26:14 -------------------------------------------------------------------------------- Physical Exam Details Patient Name: Date of Service: [name], [name] 07/28/2021 3:30 PM Medical Record Number: [id number] Patient Account Number: [id number] Date of Birth/Sex: Treating RN: 06/03/1936 (85 y.o. M) Primary Care Provider: Wenda [name] Other Clinician: Referring Provider: Treating Provider/Extender: Vincent [name], Vincent [name] in Treatment: 1 Constitutional Well-nourished and well-hydrated in no acute distress. Respiratory normal breathing without difficulty. Psychiatric this  patient is able to make decisions and demonstrates good insight into disease process. Alert and Oriented x 3. pleasant and cooperative. Notes Patient's wound bed showed signs of good granulation epithelization at this point. Fortunately it also any signs of active infection which is great news and overall I am extremely happy with where we stand at this point. No fevers, chills, nausea, vomiting, or diarrhea. Electronic Signature(s) Signed: 07/28/2021 4:26:34 PM By: Vincent Keeler PA-C Entered By: Vincent [name] on 07/28/2021 16:26:33 -------------------------------------------------------------------------------- Physician Orders Details Patient Name: Date of Service: [name], [name] 07/28/2021 3:30 PM Medical Record Number: [id number] Patient Account Number: [id number] Date of Birth/Sex: Treating RN: 05-Dec-1935 (85 y.o. [name] Primary Care Provider: Wenda [name] Other Clinician: Referring Provider: Treating Provider/Extender: Vincent [name], Vincent [name] in Treatment: 1 Verbal / Phone Orders: No Diagnosis Coding ICD-10 Coding Code Description [id number] Non-pressure chronic ulcer of other part of right lower leg with other specified severity L97.828 Non-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 leg with other specified severity I87.333 Chronic venous hypertension (idiopathic) with ulcer and inflammation of bilateral lower extremity E11.51 Type 2 diabetes mellitus with diabetic peripheral angiopathy without gangrene Follow-up Appointments ppointment in 1 week. Vincent [name] Wednesday *****extra time 60 minutes ***** Return A Bathing/ Shower/ Hygiene May shower with protection but do not get wound dressing(s) wet. Edema Control - Lymphedema / SCD / Other Bilateral Lower  Extremities Elevate legs to the level of the heart or above for 30 minutes daily and/or when sitting, a frequency of: - whenever sitting Avoid standing for long periods of time. Exercise regularly Wound Treatment Wound #1 - Lower Leg Wound Laterality: Right, Anterior, Distal Peri-Wound Care: Triamcinolone 15 (g) 1 x Per Week/15 Days Discharge Instructions: Use triamcinolone 15 (g) mixed with lotion to lower legs Peri-Wound Care: Sween Lotion (Moisturizing lotion) 1 x Per Week/15 Days Discharge Instructions: Apply moisturizing lotion as directed Prim Dressing: KerraCel Ag Gelling Fiber Dressing, 2x2 in (silver alginate) 1 x Per Week/15 Days ary Discharge Instructions: Apply silver alginate to wound bed as instructed Secondary Dressing: Woven Gauze Sponge, Non-Sterile 4x4 in 1 x Per Week/15 Days Discharge Instructions: Apply over primary dressing as directed. Compression Wrap: Kerlix Roll 4.5x3.1 (in/yd) 1 x Per Week/15 Days Discharge Instructions: Apply Kerlix and Coban compression as directed. Compression Wrap: Coban Self-Adherent Wrap 4x5 (in/yd) 1 x Per Week/15 Days Discharge Instructions: Apply over Kerlix as directed. Wound #3 - Lower Leg Wound Laterality: Right, Lateral Peri-Wound Care: Triamcinolone 15 (g) 1 x Per Week/15 Days Discharge Instructions: Use triamcinolone 15 (g) mixed with lotion to lower legs Peri-Wound Care: Sween Lotion (Moisturizing lotion) 1 x Per Week/15 Days Discharge Instructions: Apply moisturizing lotion as directed Prim Dressing: KerraCel Ag Gelling Fiber Dressing, 2x2 in (silver alginate) 1 x Per Week/15 Days ary Discharge Instructions: Apply silver alginate to wound bed as instructed Secondary Dressing: Woven Gauze Sponge, Non-Sterile 4x4 in 1 x Per Week/15 Days Discharge Instructions: Apply over primary dressing as directed. Compression Wrap: Kerlix Roll 4.5x3.1 (in/yd) 1 x Per Week/15 Days Discharge Instructions: Apply Kerlix and Coban compression as  directed. Compression Wrap: Coban Self-Adherent Wrap 4x5 (in/yd) 1 x Per Week/15 Days Discharge Instructions: Apply over Kerlix as directed. Wound #4 - Lower Leg Wound Laterality: Left, Anterior Peri-Wound Care: Triamcinolone 15 (g) 1 x Per Week/15 Days Discharge Instructions: Use triamcinolone 15 (g) mixed with lotion to lower legs Peri-Wound Care: Sween Lotion (Moisturizing lotion) 1 x Per Week/15 Days Discharge  Instructions: Apply moisturizing lotion as directed Prim Dressing: KerraCel Ag Gelling Fiber Dressing, 2x2 in (silver alginate) 1 x Per Week/15 Days ary Discharge Instructions: Apply silver alginate to wound bed as instructed Secondary Dressing: Woven Gauze Sponge, Non-Sterile 4x4 in 1 x Per Week/15 Days Discharge Instructions: Apply over primary dressing as directed. Compression Wrap: Kerlix Roll 4.5x3.1 (in/yd) 1 x Per Week/15 Days Discharge Instructions: Apply Kerlix and Coban compression as directed. Compression Wrap: Coban Self-Adherent Wrap 4x5 (in/yd) 1 x Per Week/15 Days Discharge Instructions: Apply over Kerlix as directed. Wound #6 - Lower Leg Wound Laterality: Left, Anterior, Distal Peri-Wound Care: Triamcinolone 15 (g) 1 x Per Week/15 Days Discharge Instructions: Use triamcinolone 15 (g) mixed with lotion to lower legs Peri-Wound Care: Sween Lotion (Moisturizing lotion) 1 x Per Week/15 Days Discharge Instructions: Apply moisturizing lotion as directed Prim Dressing: KerraCel Ag Gelling Fiber Dressing, 2x2 in (silver alginate) 1 x Per Week/15 Days ary Discharge Instructions: Apply silver alginate to wound bed as instructed Secondary Dressing: Woven Gauze Sponge, Non-Sterile 4x4 in 1 x Per Week/15 Days Discharge Instructions: Apply over primary dressing as directed. Compression Wrap: Kerlix Roll 4.5x3.1 (in/yd) 1 x Per Week/15 Days Discharge Instructions: Apply Kerlix and Coban compression as directed. Compression Wrap: Coban Self-Adherent Wrap 4x5 (in/yd) 1 x Per  Week/15 Days Discharge Instructions: Apply over Kerlix as directed. Wound #7 - Lower Leg Wound Laterality: Right, Anterior Peri-Wound Care: Triamcinolone 15 (g) 1 x Per Week/15 Days Discharge Instructions: Use triamcinolone 15 (g) mixed with lotion to lower legs Peri-Wound Care: Sween Lotion (Moisturizing lotion) 1 x Per Week/15 Days Discharge Instructions: Apply moisturizing lotion as directed Prim Dressing: KerraCel Ag Gelling Fiber Dressing, 2x2 in (silver alginate) 1 x Per Week/15 Days ary Discharge Instructions: Apply silver alginate to wound bed as instructed Secondary Dressing: Woven Gauze Sponge, Non-Sterile 4x4 in 1 x Per Week/15 Days Discharge Instructions: Apply over primary dressing as directed. Compression Wrap: Kerlix Roll 4.5x3.1 (in/yd) 1 x Per Week/15 Days Discharge Instructions: Apply Kerlix and Coban compression as directed. Compression Wrap: Coban Self-Adherent Wrap 4x5 (in/yd) 1 x Per Week/15 Days Discharge Instructions: Apply over Kerlix as directed. Electronic Signature(s) Signed: 07/28/2021 4:52:58 PM By: Baruch Gouty RN, BSN Signed: 07/28/2021 6:11:30 PM By: Vincent Keeler PA-C Entered By: Baruch Gouty on 07/28/2021 16:18:07 -------------------------------------------------------------------------------- Problem List Details Patient Name: Date of Service: [name], [name] 07/28/2021 3:30 PM Medical Record Number: [id number] Patient Account Number: [id number] Date of Birth/Sex: Treating RN: 11-Sep-1935 (85 y.o. M) Primary Care Provider: Wenda [name] Other Clinician: Referring Provider: Treating Provider/Extender: Vincent [name], Vincent [name] in Treatment: 1 Active Problems ICD-10 Encounter Code Description Active Date MDM Diagnosis L97.818 Non-pressure chronic ulcer of other part of right lower leg with other specified 07/15/2021 No Yes severity L97.828 Non-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 leg with other specified  07/15/2021 No Yes severity I87.333 Chronic venous hypertension (idiopathic) with ulcer and inflammation of 07/15/2021 No Yes bilateral lower extremity E11.51 Type 2 diabetes mellitus with diabetic peripheral angiopathy without gangrene 07/15/2021 No Yes Inactive Problems Resolved Problems Electronic Signature(s) Signed: 07/28/2021 3:34:05 PM By: Vincent Keeler PA-C Entered By: Vincent [name] on 07/28/2021 15:34:04 -------------------------------------------------------------------------------- Progress Note Details Patient Name: Date of Service: [name], [name] 07/28/2021 3:30 PM Medical Record Number: [id number] Patient Account Number: [id number] Date of Birth/Sex: Treating RN: 04-Dec-1935 (85 y.o. M) Primary Care Provider: Wenda [name] Other Clinician: Referring Provider: Treating Provider/Extender: Vincent [name], Vincent  [name] in Treatment: 1 Subjective Chief Complaint Information obtained from Patient 07/15/2021 patient is here for wounds on his bilateral lower legs History of Present Illness (HPI) ADMISSION 07/15/2021 This is an 85 year old man who came here for evaluation of wounds on his bilateral predominantly anterior lower legs mid aspect of the tibia. He seems to have been dealing with this for most of this year. Currently with 3 wounds on the right and 2 on the left. At least some of these seem to start off as blisters probably the majority. He has been following with Dr. Anabel [name] of [facility] dermatology according the patient back he has an appointment Dr. Anabel [name] tomorrow he was given a prescription for triamcinolone which she has been applying daily in fact it was renewed yesterday. Patient is a diabetic no recent hemoglobin A1c. He is on Coumadin for persistent atrial fibrillation. Vein and vascular in December 2021 at which time he had bilateral foot pain and finger pain. ABIs bilaterally were noncompressible however the TBI on the right was only 0.39.  There were biphasic waveforms on the left his ABI was noncompressible with biphasic waveforms but with a TBI of 0.6. He was not felt to have a primary vascular issue. There is a mention of Raynaud's phenomenon as well. Past medical history includes cellulitis of the right leg in June 2022, type 2 diabetes, persistent lower extremity edema, bilateral foot and ankle pain, Raynaud's phenomenon, coronary artery disease, congestive heart failure, pacemaker, none sustained ventricular tachycardia, remote CVA paroxysmal atrial fib on Coumadin. We did not reattempt ABIs in our clinic 07/22/2019 patient appears to be doing decently well in regard to his wounds as compared to last week with the wounds that were present last week. Fortunately I do not see any signs of active infection which is great news. No fevers, chills, nausea, vomiting, or diarrhea. With that being said he has several new blisters that are getting need to be addressed today. 07/28/2021 upon evaluation today patient actually appears to be making excellent progress. I am extremely pleased with where we stand today. I think that his wounds are significantly improved compared to where they were previous. Objective Constitutional Well-nourished and well-hydrated in no acute distress. Vitals Time Taken: 3:43 PM, Height: 69 in, Source: Stated, Weight: 185 lbs, Source: Stated, BMI: 27.3, Temperature: 98.2 F, Pulse: 83 bpm, Respiratory Rate: 18 breaths/min, Blood Pressure: 126/82 mmHg, Capillary Blood Glucose: 120 mg/dl. General Notes: glucose per pt report this am Respiratory normal breathing without difficulty. Psychiatric this patient is able to make decisions and demonstrates good insight into disease process. Alert and Oriented x 3. pleasant and cooperative. General Notes: Patient's wound bed showed signs of good granulation epithelization at this point. Fortunately it also any signs of active infection which is great news and overall  I am extremely happy with where we stand at this point. No fevers, chills, nausea, vomiting, or diarrhea. Integumentary (Hair, Skin) Wound #1 status is Open. Original cause of wound was Gradually Appeared. The date acquired was: 04/29/2021. The wound has been in treatment 1 [name]. The wound is located on the Right,Distal,Anterior Lower Leg. The wound measures 1.7cm length x 1cm width x 0.2cm depth; 1.335cm^2 area and 0.267cm^3 volume. There is Fat Layer (Subcutaneous Tissue) exposed. There is no tunneling or undermining noted. There is a medium amount of serosanguineous drainage noted. The wound margin is flat and intact. There is medium (34-66%) red granulation within the wound bed. There is a medium (34-66%) amount of necrotic tissue within  the wound bed including Adherent Slough. Wound #2 status is Open. Original cause of wound was Blister. The date acquired was: 04/29/2021. The wound has been in treatment 1 [name]. The wound is located on the Right,Proximal,Anterior Lower Leg. The wound measures 0cm length x 0cm width x 0cm depth; 0cm^2 area and 0cm^3 volume. There is no tunneling or undermining noted. There is a none present amount of drainage noted. There is no granulation within the wound bed. There is no necrotic tissue within the wound bed. Wound #3 status is Open. Original cause of wound was Blister. The date acquired was: 04/29/2021. The wound has been in treatment 1 [name]. The wound is located on the Right,Lateral Lower Leg. The wound measures 1.7cm length x 1.2cm width x 0.1cm depth; 1.602cm^2 area and 0.16cm^3 volume. There is Fat Layer (Subcutaneous Tissue) exposed. There is no tunneling or undermining noted. There is a medium amount of serous drainage noted. The wound margin is flat and intact. There is large (67-100%) red granulation within the wound bed. There is no necrotic tissue within the wound bed. Wound #4 status is Open. Original cause of wound was Blister. The date acquired was:  04/29/2021. The wound has been in treatment 1 [name]. The wound is located on the Left,Anterior Lower Leg. The wound measures 1.6cm length x 1.7cm width x 0.1cm depth; 2.136cm^2 area and 0.214cm^3 volume. There is Fat Layer (Subcutaneous Tissue) exposed. There is no tunneling or undermining noted. There is a medium amount of serous drainage noted. The wound margin is flat and intact. There is large (67-100%) red granulation within the wound bed. There is no necrotic tissue within the wound bed. Wound #5 status is Healed - Epithelialized. Original cause of wound was Blister. The date acquired was: 04/29/2021. The wound has been in treatment 1 [name]. The wound is located on the Left,Lateral Lower Leg. The wound measures 0cm length x 0cm width x 0cm depth; 0cm^2 area and 0cm^3 volume. There is no tunneling or undermining noted. There is a none present amount of drainage noted. There is no granulation within the wound bed. There is no necrotic tissue within the wound bed. Wound #6 status is Open. Original cause of wound was Blister. The date acquired was: 07/21/2021. The wound has been in treatment 1 [name]. The wound is located on the [facility] Lower Leg. The wound measures 2cm length x 2.5cm width x 0.1cm depth; 3.927cm^2 area and 0.393cm^3 volume. There is Fat Layer (Subcutaneous Tissue) exposed. There is no tunneling or undermining noted. There is a medium amount of serosanguineous drainage noted. The wound margin is distinct with the outline attached to the wound base. There is large (67-100%) red granulation within the wound bed. There is no necrotic tissue within the wound bed. Wound #7 status is Open. Original cause of wound was Blister. The date acquired was: 07/21/2021. The wound has been in treatment 1 [name]. The wound is located on the Right,Anterior Lower Leg. The wound measures 1.2cm length x 1cm width x 0.1cm depth; 0.942cm^2 area and 0.094cm^3 volume. There is Fat Layer (Subcutaneous  Tissue) exposed. There is no tunneling or undermining noted. There is a medium amount of serosanguineous drainage noted. The wound margin is distinct with the outline attached to the wound base. There is large (67-100%) red granulation within the wound bed. There is no necrotic tissue within the wound bed. Wound #8 status is Healed - Epithelialized. Original cause of wound was Blister. The date acquired was: 07/21/2021. The wound has been  in treatment 1 [name]. The wound is located on the Right,Medial Lower Leg. The wound measures 0cm length x 0cm width x 0cm depth; 0cm^2 area and 0cm^3 volume. There is no tunneling or undermining noted. There is a none present amount of drainage noted. There is no granulation within the wound bed. There is no necrotic tissue within the wound bed. 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 Vincent [name] Wednesday *****extra time 60 minutes ***** Bathing/ Shower/ Hygiene: May shower with protection but do not get wound dressing(s) wet. Edema Control - Lymphedema / SCD / Other: Elevate legs to the level of the heart or above for 30 minutes daily and/or when sitting, a frequency of: - whenever sitting Avoid standing for long periods of time. Exercise regularly WOUND #1: - Lower Leg Wound Laterality: Right, Anterior, Distal Peri-Wound Care: Triamcinolone 15 (g) 1 x Per Week/15 Days Discharge Instructions: Use triamcinolone 15 (g) mixed with lotion to lower legs Peri-Wound Care: Sween Lotion (Moisturizing lotion) 1 x Per Week/15 Days Discharge Instructions: Apply  moisturizing lotion as directed Prim Dressing: KerraCel Ag Gelling Fiber Dressing, 2x2 in (silver alginate) 1 x Per Week/15 Days ary Discharge Instructions: Apply silver alginate to wound bed as instructed Secondary Dressing: Woven Gauze Sponge, Non-Sterile 4x4 in 1 x Per Week/15 Days Discharge Instructions: Apply over primary dressing as directed. Com pression Wrap: Kerlix Roll 4.5x3.1 (in/yd) 1 x Per Week/15 Days Discharge Instructions: Apply Kerlix and Coban compression as directed. Com pression Wrap: Coban Self-Adherent Wrap 4x5 (in/yd) 1 x Per Week/15 Days Discharge Instructions: Apply over Kerlix as directed. WOUND #3: - Lower Leg Wound Laterality: Right, Lateral Peri-Wound Care: Triamcinolone 15 (g) 1 x Per Week/15 Days Discharge Instructions: Use triamcinolone 15 (g) mixed with lotion to lower legs Peri-Wound Care: Sween Lotion (Moisturizing lotion) 1 x Per Week/15 Days Discharge Instructions: Apply moisturizing lotion as directed Prim Dressing: KerraCel Ag Gelling Fiber Dressing, 2x2 in (silver alginate) 1 x Per Week/15 Days ary Discharge Instructions: Apply silver alginate to wound bed as instructed Secondary Dressing: Woven Gauze Sponge, Non-Sterile 4x4 in 1 x Per Week/15 Days Discharge Instructions: Apply over primary dressing as directed. Com pression Wrap: Kerlix Roll 4.5x3.1 (in/yd) 1 x Per Week/15 Days Discharge Instructions: Apply Kerlix and Coban compression as directed. Com pression Wrap: Coban Self-Adherent Wrap 4x5 (in/yd) 1 x Per Week/15 Days Discharge Instructions: Apply over Kerlix as directed. WOUND #4: - Lower Leg Wound Laterality: Left, Anterior Peri-Wound Care: Triamcinolone 15 (g) 1 x Per Week/15 Days Discharge Instructions: Use triamcinolone 15 (g) mixed with lotion to lower legs Peri-Wound Care: Sween Lotion (Moisturizing lotion) 1 x Per Week/15 Days Discharge Instructions: Apply moisturizing lotion as directed Prim Dressing: KerraCel Ag Gelling Fiber  Dressing, 2x2 in (silver alginate) 1 x Per Week/15 Days ary Discharge Instructions: Apply silver alginate to wound bed as instructed Secondary Dressing: Woven Gauze Sponge, Non-Sterile 4x4 in 1 x Per Week/15 Days Discharge Instructions: Apply over primary dressing as directed. Com pression Wrap: Kerlix Roll 4.5x3.1 (in/yd) 1 x Per Week/15 Days Discharge Instructions: Apply Kerlix and Coban compression as directed. Com pression Wrap: Coban Self-Adherent Wrap 4x5 (in/yd) 1 x Per Week/15 Days Discharge Instructions: Apply over  Kerlix as directed. WOUND #6: - Lower Leg Wound Laterality: Left, Anterior, Distal Peri-Wound Care: Triamcinolone 15 (g) 1 x Per Week/15 Days Discharge Instructions: Use triamcinolone 15 (g) mixed with lotion to lower legs Peri-Wound Care: Sween Lotion (Moisturizing lotion) 1 x Per Week/15 Days Discharge Instructions: Apply moisturizing lotion as directed Prim Dressing: Kerra[employer]el [employer] Gelling Fiber Dressing, 2x2 in (silver alginate) 1 x Per Week/15 Days ary Discharge Instructions: Apply silver alginate to wound bed as instructed Secondary Dressing: Woven Gauze Sponge, Non-Sterile 4x4 in 1 x Per Week/15 Days Discharge Instructions: Apply over primary dressing as directed. Com pression Wrap: Kerlix Roll 4.5x3.1 (in/yd) 1 x Per Week/15 Days Discharge Instructions: Apply Kerlix and Coban compression as directed. Com pression Wrap: Coban Self-Adherent Wrap 4x5 (in/yd) 1 x Per Week/15 Days Discharge Instructions: Apply over Kerlix as directed. WOUND #7: - Lower Leg Wound Laterality: Right, Anterior Peri-Wound Care: Triamcinolone 15 (g) 1 x Per Week/15 Days Discharge Instructions: Use triamcinolone 15 (g) mixed with lotion to lower legs Peri-Wound Care: Sween Lotion (Moisturizing lotion) 1 x Per Week/15 Days Discharge Instructions: Apply moisturizing lotion as directed Prim Dressing: KerraCel Ag Gelling Fiber Dressing, 2x2 in (silver alginate) 1 x Per Week/15  Days ary Discharge Instructions: Apply silver alginate to wound bed as instructed Secondary Dressing: Woven Gauze Sponge, Non-Sterile 4x4 in 1 x Per Week/15 Days Discharge Instructions: Apply over primary dressing as directed. Com pression Wrap: Kerlix Roll 4.5x3.1 (in/yd) 1 x Per Week/15 Days Discharge Instructions: Apply Kerlix and Coban compression as directed. Com pression Wrap: Coban Self-Adherent Wrap 4x5 (in/yd) 1 x Per Week/15 Days Discharge Instructions: Apply over Kerlix as directed. 1. Would recommend that we go ahead and continue with the wound care measures as before and the patient is in agreement with the plan. This includes the use of the silver alginate dressing which I think is still doing a great job. 2. I am also can recommend that we have the patient continue with the Curlex and Coban wraps bilaterally which seem to be doing quite well for him as well. I am extremely pleased with how is responded currently. We will see patient back for reevaluation in 1 week here in the clinic. If anything worsens or changes patient will contact our office for additional recommendations. Electronic Signature(s) Signed: 07/28/2021 4:32:22 PM By: Vincent Keeler PA-C Entered By: Vincent [name] on 07/28/2021 16:32:22 -------------------------------------------------------------------------------- SuperBill Details Patient Name: Date of Service: [name], [name] 07/28/2021 Medical Record Number: [id number] Patient Account Number: [id number] Date of Birth/Sex: Treating RN: [birth year] (85 y.o. 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 Facility Procedures CPT4 Code: [id number] Description: [phone] - WOUND CARE VISIT-LEV 5 EST PT Modifier: Quantity: 1 Physician Procedures : CPT4 Code Description Modifier [id number] - WC PHYS LEVEL 3 - EST PT ICD-10 Diagnosis Description L97.818 Non-pressure chronic ulcer of other part of right lower leg with other specified severity L97.828 Non-pressure chronic ulcer of other part of  left lower leg with other specified severity I87.333 Chronic venous hypertension (idiopathic) with ulcer and  inflammation of bilateral lower extremity E11.51 Type 2 diabetes mellitus with diabetic peripheral angiopathy without gangrene Quantity: 1 Electronic Signature(s) Signed: 07/28/2021 4:32:33 PM By: Vincent Keeler PA-C Entered By: Vincent [name] on 07/28/2021 16:32:32

## 2021-07-28 NOTE — Progress Notes (Signed)
[name], Vincent M. ([id number]) Visit Report for 07/15/2021 Chief Complaint Document Details Patient Name: Date of Service: [name] ID M. 07/15/2021 1:15 PM Medical Record Number: [id number] Patient Account Number: [id number] Date of Birth/Sex: Treating RN: [birth year] (84 y.o. Vincent [name] Primary Care Provider: [name] Other Clinician: Referring Provider: Treating Provider/Extender: Lajean Silvius, Fransisca Connors in Treatment: 0 Information Obtained from: Patient Chief Complaint 07/15/2021 patient is here for wounds on his bilateral lower legs Electronic Signature(s) Signed: 07/15/2021 5:16:34 PM By: Linton Ham MD Entered By: Linton Ham on 07/15/2021 15:20:37 -------------------------------------------------------------------------------- HPI Details Patient Name: Date of Service: DA Hassie [name], [name] 07/15/2021 1:15 PM Medical Record Number: [id number] Patient Account Number: [id number] Date of Birth/Sex: Treating RN: 1935-11-23 (85 y.o. Vincent [name] Primary Care Provider: [name] Other Clinician: Referring Provider: Treating Provider/Extender: Lajean Silvius, Fransisca Connors in Treatment: 0 History of Present Illness HPI Description: ADMISSION 07/15/2021 This is an 85 year old man who came here for evaluation of wounds on his bilateral predominantly anterior lower legs mid aspect of the tibia. He seems to have been dealing with this for most of this year. Currently with 3 wounds on the right and 2 on the left. At least some of these seem to start off as blisters probably the majority. He has been following with Dr. Anabel Bene of [facility] dermatology according the patient back he has an appointment Dr. Anabel Bene tomorrow he was given a prescription for triamcinolone which she has been applying daily in fact it was renewed yesterday. Patient is a diabetic no recent hemoglobin A1c. He is on Coumadin for persistent atrial fibrillation. Vein  and vascular in December 2021 at which time he had bilateral foot pain and finger pain. ABIs bilaterally were noncompressible however the TBI on the right was only 0.39. There were biphasic waveforms on the left his ABI was noncompressible with biphasic waveforms but with a TBI of 0.6. He was not felt to have a primary vascular issue. There is a [name] of Raynaud's phenomenon as well. Past medical history includes cellulitis of the right leg in June 2022, type 2 diabetes, persistent lower extremity edema, bilateral foot and ankle pain, Raynaud's phenomenon, coronary artery disease, congestive heart failure, pacemaker, none sustained ventricular tachycardia, remote CVA paroxysmal atrial fib on Coumadin. We did not reattempt ABIs in our clinic Electronic Signature(s) Signed: 07/15/2021 5:16:34 PM By: Linton Ham MD Entered By: Linton Ham on 07/15/2021 15:25:18 -------------------------------------------------------------------------------- Physical Exam Details Patient Name: Date of Service: [name], [name] 07/15/2021 1:15 PM Medical Record Number: [id number] Patient Account Number: [id number] Date of Birth/Sex: Treating RN: 08/29/1936 (85 y.o. Vincent [name] Primary Care Provider: [name] Other Clinician: Referring Provider: Treating Provider/Extender: Bryon Lions in Treatment: 0 Constitutional Sitting or standing Blood Pressure is within target range for patient.. Pulse regular and within target range for patient.Marland Kitchen Respirations regular, non-labored and within target range.. Temperature is normal and within the target range for the patient.Marland Kitchen Appears in no distress. Cardiovascular Pedal pulses absent bilaterally. I do not feel popliteal pulses bilaterally. Integumentary (Hair, Skin) No evidence of systemic dermatologic disease. Notes Wound exam; patient has 3 wounds to anteriorly and 1 laterally. These are superficial similar areas x2 on the  left anteriorly. The patient has poorly controlled edema which is nonpitting bilaterally. This does not extend into the thigh. The skin anteriorly on the lower legs looks damaged chronically inflamed likely chronic stasis dermatitis. He has some nodular areas which I  suspect are some component of lymphedema. Electronic Signature(s) Signed: 07/15/2021 5:16:34 PM By: Linton Ham MD Entered By: Linton Ham on 07/15/2021 15:26:53 -------------------------------------------------------------------------------- Physician Orders Details Patient Name: Date of Service: [name], [name] 07/15/2021 1:15 PM Medical Record Number: [id number] Patient Account Number: [id number] Date of Birth/Sex: Treating RN: November 15, 1935 (85 y.o. Vincent [name] Primary Care Provider: [name] Other Clinician: Referring Provider: Treating Provider/Extender: Bryon Lions in Treatment: 0 Verbal / Phone Orders: No Diagnosis Coding Follow-up Appointments Return Appointment in 1 week. Bathing/ Shower/ Hygiene May shower with protection but do not get wound dressing(s) wet. Edema Control - Lymphedema / SCD / Other Bilateral Lower Extremities Elevate legs to the level of the heart or above for 30 minutes daily and/or when sitting, a frequency of: - whenever sitting Avoid standing for long periods of time. Exercise regularly Wound Treatment Wound #1 - Lower Leg Wound Laterality: Right, Anterior Peri-Wound Care: Triamcinolone 15 (g) 1 x Per Week/15 Days Discharge Instructions: Use triamcinolone 15 (g) mixed with lotion to lower legs Peri-Wound Care: Sween Lotion (Moisturizing lotion) 1 x Per Week/15 Days Discharge Instructions: Apply moisturizing lotion as directed Prim Dressing: KerraCel Ag Gelling Fiber Dressing, 2x2 in (silver alginate) 1 x Per Week/15 Days ary Discharge Instructions: Apply silver alginate to wound bed as instructed Secondary Dressing: Woven Gauze Sponge,  Non-Sterile 4x4 in 1 x Per Week/15 Days Discharge Instructions: Apply over primary dressing as directed. Compression Wrap: Kerlix Roll 4.5x3.1 (in/yd) 1 x Per Week/15 Days Discharge Instructions: Apply Kerlix and Coban compression as directed. Compression Wrap: Coban Self-Adherent Wrap 4x5 (in/yd) 1 x Per Week/15 Days Discharge Instructions: Apply over Kerlix as directed. Wound #2 - Lower Leg Wound Laterality: Right, Anterior, Proximal Peri-Wound Care: Triamcinolone 15 (g) 1 x Per Week/15 Days Discharge Instructions: Use triamcinolone 15 (g) mixed with lotion to lower legs Peri-Wound Care: Sween Lotion (Moisturizing lotion) 1 x Per Week/15 Days Discharge Instructions: Apply moisturizing lotion as directed Prim Dressing: KerraCel Ag Gelling Fiber Dressing, 2x2 in (silver alginate) 1 x Per Week/15 Days ary Discharge Instructions: Apply silver alginate to wound bed as instructed Secondary Dressing: Woven Gauze Sponge, Non-Sterile 4x4 in 1 x Per Week/15 Days Discharge Instructions: Apply over primary dressing as directed. Compression Wrap: Kerlix Roll 4.5x3.1 (in/yd) 1 x Per Week/15 Days Discharge Instructions: Apply Kerlix and Coban compression as directed. Compression Wrap: Coban Self-Adherent Wrap 4x5 (in/yd) 1 x Per Week/15 Days Discharge Instructions: Apply over Kerlix as directed. Wound #3 - Lower Leg Wound Laterality: Right, Lateral Peri-Wound Care: Triamcinolone 15 (g) 1 x Per Week/15 Days Discharge Instructions: Use triamcinolone 15 (g) mixed with lotion to lower legs Peri-Wound Care: Sween Lotion (Moisturizing lotion) 1 x Per Week/15 Days Discharge Instructions: Apply moisturizing lotion as directed Prim Dressing: KerraCel Ag Gelling Fiber Dressing, 2x2 in (silver alginate) 1 x Per Week/15 Days ary Discharge Instructions: Apply silver alginate to wound bed as instructed Secondary Dressing: Woven Gauze Sponge, Non-Sterile 4x4 in 1 x Per Week/15 Days Discharge Instructions: Apply  over primary dressing as directed. Compression Wrap: Kerlix Roll 4.5x3.1 (in/yd) 1 x Per Week/15 Days Discharge Instructions: Apply Kerlix and Coban compression as directed. Compression Wrap: Coban Self-Adherent Wrap 4x5 (in/yd) 1 x Per Week/15 Days Discharge Instructions: Apply over Kerlix as directed. Wound #4 - Lower Leg Wound Laterality: Left, Anterior Peri-Wound Care: Triamcinolone 15 (g) 1 x Per Week/15 Days Discharge Instructions: Use triamcinolone 15 (g) mixed with lotion to lower legs Peri-Wound Care: Sween Lotion (Moisturizing lotion)  1 x Per Week/15 Days Discharge Instructions: Apply moisturizing lotion as directed Prim Dressing: KerraCel Ag Gelling Fiber Dressing, 2x2 in (silver alginate) 1 x Per Week/15 Days ary Discharge Instructions: Apply silver alginate to wound bed as instructed Secondary Dressing: Woven Gauze Sponge, Non-Sterile 4x4 in 1 x Per Week/15 Days Discharge Instructions: Apply over primary dressing as directed. Compression Wrap: Kerlix Roll 4.5x3.1 (in/yd) 1 x Per Week/15 Days Discharge Instructions: Apply Kerlix and Coban compression as directed. Compression Wrap: Coban Self-Adherent Wrap 4x5 (in/yd) 1 x Per Week/15 Days Discharge Instructions: Apply over Kerlix as directed. Wound #5 - Lower Leg Wound Laterality: Left, Lateral Peri-Wound Care: Triamcinolone 15 (g) 1 x Per Week/15 Days Discharge Instructions: Use triamcinolone 15 (g) mixed with lotion to lower legs Peri-Wound Care: Sween Lotion (Moisturizing lotion) 1 x Per Week/15 Days Discharge Instructions: Apply moisturizing lotion as directed Prim Dressing: KerraCel Ag Gelling Fiber Dressing, 2x2 in (silver alginate) ary 1 x Per Week/15 Days Discharge Instructions: Apply silver alginate to wound bed as instructed Secondary Dressing: Woven Gauze Sponge, Non-Sterile 4x4 in 1 x Per Week/15 Days Discharge Instructions: Apply over primary dressing as directed. Compression Wrap: Kerlix Roll 4.5x3.1 (in/yd) 1 x  Per Week/15 Days Discharge Instructions: Apply Kerlix and Coban compression as directed. Compression Wrap: Coban Self-Adherent Wrap 4x5 (in/yd) 1 x Per Week/15 Days Discharge Instructions: Apply over Kerlix as directed. Electronic Signature(s) Signed: 07/15/2021 5:16:34 PM By: Linton Ham MD Signed: 07/15/2021 5:41:09 PM By: Baruch Gouty RN, BSN Entered By: Baruch Gouty on 07/15/2021 15:13:42 -------------------------------------------------------------------------------- Problem List Details Patient Name: Date of Service: DA Hassie [name], [name] 07/15/2021 1:15 PM Medical Record Number: [id number] Patient Account Number: [id number] Date of Birth/Sex: Treating RN: 1936-03-26 (85 y.o. Vincent [name] Primary Care Provider: [name] Other Clinician: Referring Provider: Treating Provider/Extender: Bryon Lions in Treatment: 0 Active Problems ICD-10 Encounter Code Description Active Date MDM Diagnosis L97.818 Non-pressure chronic ulcer of other part of right lower leg with other specified 07/15/2021 No Yes severity L97.828 Non-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 leg with other specified 07/15/2021 No Yes severity I87.333 Chronic venous hypertension (idiopathic) with ulcer and inflammation of 07/15/2021 No Yes bilateral lower extremity E11.51 Type 2 diabetes mellitus with diabetic peripheral angiopathy without gangrene 07/15/2021 No Yes Inactive Problems Resolved Problems Electronic Signature(s) Signed: 07/15/2021 5:16:34 PM By: Linton Ham MD Entered By: Linton Ham on 07/15/2021 15:15:44 -------------------------------------------------------------------------------- Progress Note Details Patient Name: Date of Service: DA Hassie [name], [name] 07/15/2021 1:15 PM Medical Record Number: [id number] Patient Account Number: [id number] Date of Birth/Sex: Treating RN: 24-Aug-1936 (85 y.o. Vincent [name] Primary Care Provider:  [name] Other Clinician: Referring Provider: Treating Provider/Extender: Lajean Silvius, Fransisca Connors in Treatment: 0 Subjective Chief Complaint Information obtained from Patient 07/15/2021 patient is here for wounds on his bilateral lower legs History of Present Illness (HPI) ADMISSION 07/15/2021 This is an 85 year old man who came here for evaluation of wounds on his bilateral predominantly anterior lower legs mid aspect of the tibia. He seems to have been dealing with this for most of this year. Currently with 3 wounds on the right and 2 on the left. At least some of these seem to start off as blisters probably the majority. He has been following with Dr. Anabel Bene of [facility] dermatology according the patient back he has an appointment Dr. Anabel Bene tomorrow he was given a prescription for triamcinolone which she has been applying daily in fact it was renewed yesterday.  Patient is a diabetic no recent hemoglobin A1c. He is on Coumadin for persistent atrial fibrillation. Vein and vascular in December 2021 at which time he had bilateral foot pain and finger pain. ABIs bilaterally were noncompressible however the TBI on the right was only 0.39. There were biphasic waveforms on the left his ABI was noncompressible with biphasic waveforms but with a TBI of 0.6. He was not felt to have a primary vascular issue. There is a [name] of Raynaud's phenomenon as well. Past medical history includes cellulitis of the right leg in June 2022, type 2 diabetes, persistent lower extremity edema, bilateral foot and ankle pain, Raynaud's phenomenon, coronary artery disease, congestive heart failure, pacemaker, none sustained ventricular tachycardia, remote CVA paroxysmal atrial fib on Coumadin. We did not reattempt ABIs in our clinic Patient History Information obtained from Patient, Chart. Allergies No Known Allergies Family History Cancer - Mother,Siblings, Heart Disease - Father,  Hypertension - Mother,Father, Thyroid Problems - Siblings, No family history of Diabetes, Hereditary Spherocytosis, Kidney Disease, Lung Disease, Seizures, Stroke, Tuberculosis. Social History Former smoker, Marital Status - Divorced, Alcohol Use - Never, Drug Use - No History, Caffeine Use - Moderate - coffee. Medical History Eyes Patient has history of Cataracts - left eye, Glaucoma Denies history of Optic Neuritis Respiratory Patient has history of Chronic Obstructive Pulmonary Disease (COPD) Cardiovascular Patient has history of Arrhythmia - afib, Congestive Heart Failure, Coronary Artery Disease, Hypertension Endocrine Patient has history of Type II Diabetes Denies history of Type I Diabetes Genitourinary Denies history of End Stage Renal Disease Integumentary (Skin) Denies history of History of Burn Musculoskeletal Patient has history of Gout Neurologic Patient has history of Neuropathy Oncologic Denies history of Received Chemotherapy, Received Radiation Psychiatric Denies history of Anorexia/bulimia, Confinement Anxiety Patient is treated with Oral Agents. Blood sugar is tested. Hospitalization/Surgery History - cardiac cath with stents. - colonoscopy. - knee surgery. Medical A Surgical History Notes nd Respiratory pulmonary eosinophilia Cardiovascular hyperlipidemia, alcoholic cardiomyopathy Gastrointestinal GERD Genitourinary incontinence, enlarged prostate Neurologic left foot drop Review of Systems (ROS) Constitutional Symptoms (General Health) Denies complaints or symptoms of Fatigue, Fever, Chills, Marked Weight Change. Eyes Complains or has symptoms of Glasses / Contacts. Denies complaints or symptoms of Dry Eyes, Vision Changes. Ear/Nose/Mouth/Throat Denies complaints or symptoms of Chronic sinus problems or rhinitis. Respiratory Complains or has symptoms of Shortness of Breath. Denies complaints or symptoms of Chronic or frequent  coughs. Cardiovascular Denies complaints or symptoms of Chest pain. Gastrointestinal Denies complaints or symptoms of Frequent diarrhea, Nausea, Vomiting. Genitourinary Complains or has symptoms of Frequent urination. Integumentary (Skin) Complains or has symptoms of Wounds - bil lower legs. Musculoskeletal Denies complaints or symptoms of Muscle Pain, Muscle Weakness. Neurologic Denies complaints or symptoms of Numbness/parasthesias. Psychiatric Denies complaints or symptoms of Claustrophobia, Suicidal. Objective Constitutional Sitting or standing Blood Pressure is within target range for patient.. Pulse regular and within target range for patient.Marland Kitchen Respirations regular, non-labored and within target range.. Temperature is normal and within the target range for the patient.Marland Kitchen Appears in no distress. Vitals Time Taken: 2:12 PM, Height: 69 in, Source: Stated, Weight: 185 lbs, BMI: 27.3, Temperature: 98.3 F, Pulse: 72 bpm, Respiratory Rate: 18 breaths/min, Blood Pressure: 124/75 mmHg, Capillary Blood Glucose: 120 mg/dl. General Notes: glucose per pt report this am Cardiovascular Pedal pulses absent bilaterally. I do not feel popliteal pulses bilaterally. General Notes: Wound exam; patient has 3 wounds to anteriorly and 1 laterally. These are superficial similar areas x2 on the left anteriorly. The patient has poorly  controlled edema which is nonpitting bilaterally. This does not extend into the thigh. The skin anteriorly on the lower legs looks damaged chronically inflamed likely chronic stasis dermatitis. He has some nodular areas which I suspect are some component of lymphedema. Integumentary (Hair, Skin) No evidence of systemic dermatologic disease. Wound #1 status is Open. Original cause of wound was Gradually Appeared. The date acquired was: 04/29/2021. The wound is located on the Right,Anterior Lower Leg. The wound measures 3.5cm length x 2.4cm width x 0.1cm depth; 6.597cm^2 area and  0.66cm^3 volume. There is Fat Layer (Subcutaneous Tissue) exposed. There is no tunneling or undermining noted. There is a medium amount of serosanguineous drainage noted. The wound margin is flat and intact. There is medium (34-66%) red granulation within the wound bed. There is a medium (34-66%) amount of necrotic tissue within the wound bed including Adherent Slough. Wound #2 status is Open. Original cause of wound was Blister. The date acquired was: 04/29/2021. The wound is located on the Right,Proximal,Anterior Lower Leg. The wound measures 0.6cm length x 0.6cm width x 0.1cm depth; 0.283cm^2 area and 0.028cm^3 volume. There is Fat Layer (Subcutaneous Tissue) exposed. There is no tunneling or undermining noted. There is a medium amount of serous drainage noted. The wound margin is flat and intact. There is large (67-100%) red granulation within the wound bed. There is no necrotic tissue within the wound bed. Wound #3 status is Open. Original cause of wound was Blister. The date acquired was: 04/29/2021. The wound is located on the Right,Lateral Lower Leg. The wound measures 1.3cm length x 1.3cm width x 0.1cm depth; 1.327cm^2 area and 0.133cm^3 volume. There is Fat Layer (Subcutaneous Tissue) exposed. There is no tunneling or undermining noted. There is a medium amount of serous drainage noted. The wound margin is flat and intact. There is large (67-100%) red granulation within the wound bed. There is no necrotic tissue within the wound bed. Wound #4 status is Open. Original cause of wound was Blister. The date acquired was: 04/29/2021. The wound is located on the Left,Anterior Lower Leg. The wound measures 1cm length x 0.8cm width x 0.1cm depth; 0.628cm^2 area and 0.063cm^3 volume. There is Fat Layer (Subcutaneous Tissue) exposed. There is no tunneling or undermining noted. There is a medium amount of serous drainage noted. The wound margin is flat and intact. There is large (67-100%) red granulation  within the wound bed. There is no necrotic tissue within the wound bed. Wound #5 status is Open. Original cause of wound was Blister. The date acquired was: 04/29/2021. The wound is located on the Left,Lateral Lower Leg. The wound measures 0.9cm length x 1.4cm width x 0.1cm depth; 0.99cm^2 area and 0.099cm^3 volume. There is Fat Layer (Subcutaneous Tissue) exposed. There is no tunneling or undermining noted. There is a medium amount of serosanguineous drainage noted. The wound margin is flat and intact. There is large (67- 100%) red granulation within the wound bed. There is a small (1-33%) amount of necrotic tissue within the wound bed including Adherent Slough. 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 Bathing/ Shower/ Hygiene: May shower with protection but do not get wound dressing(s) wet. Edema Control - Lymphedema / SCD /  Other: Elevate legs to the level of the heart or above for 30 minutes daily and/or when sitting, a frequency of: - whenever sitting Avoid standing for long periods of time. Exercise regularly WOUND #1: - Lower Leg Wound Laterality: Right, Anterior Peri-Wound Care: Triamcinolone 15 (g) 1 x Per Week/15 Days Discharge Instructions: Use triamcinolone 15 (g) mixed with lotion to lower legs Peri-Wound Care: Sween Lotion (Moisturizing lotion) 1 x Per Week/15 Days Discharge Instructions: Apply moisturizing lotion as directed Prim Dressing: KerraCel Ag Gelling Fiber Dressing, 2x2 in (silver alginate) 1 x Per Week/15 Days ary Discharge Instructions: Apply silver alginate to  wound bed as instructed Secondary Dressing: Woven Gauze Sponge, Non-Sterile 4x4 in 1 x Per Week/15 Days Discharge Instructions: Apply over primary dressing as directed. Com pression Wrap: Kerlix Roll 4.5x3.1 (in/yd) 1 x Per Week/15 Days Discharge Instructions: Apply Kerlix and Coban compression as directed. Com pression Wrap: Coban Self-Adherent Wrap 4x5 (in/yd) 1 x Per Week/15 Days Discharge Instructions: Apply over Kerlix as directed. WOUND #2: - Lower Leg Wound Laterality: Right, Anterior, Proximal Peri-Wound Care: Triamcinolone 15 (g) 1 x Per Week/15 Days Discharge Instructions: Use triamcinolone 15 (g) mixed with lotion to lower legs Peri-Wound Care: Sween Lotion (Moisturizing lotion) 1 x Per Week/15 Days Discharge Instructions: Apply moisturizing lotion as directed Prim Dressing: KerraCel Ag Gelling Fiber Dressing, 2x2 in (silver alginate) 1 x Per Week/15 Days ary Discharge Instructions: Apply silver alginate to wound bed as instructed Secondary Dressing: Woven Gauze Sponge, Non-Sterile 4x4 in 1 x Per Week/15 Days Discharge Instructions: Apply over primary dressing as directed. Com pression Wrap: Kerlix Roll 4.5x3.1 (in/yd) 1 x Per Week/15 Days Discharge Instructions: Apply Kerlix and Coban compression as directed. Com pression Wrap: Coban Self-Adherent Wrap 4x5 (in/yd) 1 x Per Week/15 Days Discharge Instructions: Apply over Kerlix as directed. WOUND #3: - Lower Leg Wound Laterality: Right, Lateral Peri-Wound Care: Triamcinolone 15 (g) 1 x Per Week/15 Days Discharge Instructions: Use triamcinolone 15 (g) mixed with lotion to lower legs Peri-Wound Care: Sween Lotion (Moisturizing lotion) 1 x Per Week/15 Days Discharge Instructions: Apply moisturizing lotion as directed Prim Dressing: KerraCel Ag Gelling Fiber Dressing, 2x2 in (silver alginate) 1 x Per Week/15 Days ary Discharge Instructions: Apply silver alginate to wound bed as instructed Secondary Dressing: Woven Gauze Sponge,  Non-Sterile 4x4 in 1 x Per Week/15 Days Discharge Instructions: Apply over primary dressing as directed. Com pression Wrap: Kerlix Roll 4.5x3.1 (in/yd) 1 x Per Week/15 Days Discharge Instructions: Apply Kerlix and Coban compression as directed. Com pression Wrap: Coban Self-Adherent Wrap 4x5 (in/yd) 1 x Per Week/15 Days Discharge Instructions: Apply over Kerlix as directed. WOUND #4: - Lower Leg Wound Laterality: Left, Anterior Peri-Wound Care: Triamcinolone 15 (g) 1 x Per Week/15 Days Discharge Instructions: Use triamcinolone 15 (g) mixed with lotion to lower legs Peri-Wound Care: Sween Lotion (Moisturizing lotion) 1 x Per Week/15 Days Discharge Instructions: Apply moisturizing lotion as directed Prim Dressing: KerraCel Ag Gelling Fiber Dressing, 2x2 in (silver alginate) 1 x Per Week/15 Days ary Discharge Instructions: Apply silver alginate to wound bed as instructed Secondary Dressing: Woven Gauze Sponge, Non-Sterile 4x4 in 1 x Per Week/15 Days Discharge Instructions: Apply over primary dressing as directed. Com pression Wrap: Kerlix Roll 4.5x3.1 (in/yd) 1 x Per Week/15 Days Discharge Instructions: Apply Kerlix and Coban compression as directed. Com pression Wrap: Coban Self-Adherent Wrap 4x5 (in/yd) 1 x Per Week/15 Days Discharge Instructions: Apply over Kerlix as directed. WOUND #5: - Lower Leg Wound Laterality: Left, Lateral Peri-Wound Care: Triamcinolone  15 (g) 1 x Per Week/15 Days Discharge Instructions: Use triamcinolone 15 (g) mixed with lotion to lower legs Peri-Wound Care: Sween Lotion (Moisturizing lotion) 1 x Per Week/15 Days Discharge Instructions: Apply moisturizing lotion as directed Prim Dressing: KerraCel Ag Gelling Fiber Dressing, 2x2 in (silver alginate) 1 x Per Week/15 Days ary Discharge Instructions: Apply silver alginate to wound bed as instructed Secondary Dressing: Woven Gauze Sponge, Non-Sterile 4x4 in 1 x Per Week/15 Days Discharge Instructions: Apply over  primary dressing as directed. Com pression Wrap: Kerlix Roll 4.5x3.1 (in/yd) 1 x Per Week/15 Days Discharge Instructions: Apply Kerlix and Coban compression as directed. Compression Wrap: Coban Self-Adherent Wrap 4x5 (in/yd) 1 x Per Week/15 Days Discharge Instructions: Apply over Kerlix as directed. 1. I think this patient probably has chronic stasis dermatitis with some degree of secondary lymphedema. 2. He also probably has some degree of PAD, I will need to more thoroughly look at Dr. Mora Appl note from late last year. Apparently the term Raynaud's phenomenon was put out there. 3. We will use liberal TCA, silver alginate kerlix Coban. I am not sure how comfortable I will be increasing his compression more liberally 4. We will see how well the kerlix Coban does with the swelling. We could consider an Haematologist I suppose 5. I do not think this is a blistering skin disease but I would like records from the dermatologist he is seeing Dr. Anabel Bene 6. The patient has type 2 diabetes at 1 point poorly controlled. I am not sure where the control is currently. I suspect he has secondary angiopathy related to this. I did not get a history of claudication although I get the sense he is a minimal ambulator I spent 45 minutes in reviewing this patient's past medical history, face-to-face evaluation and preparation of this record Electronic Signature(s) Signed: 07/15/2021 5:16:34 PM By: Linton Ham MD Entered By: Linton Ham on 07/15/2021 15:29:11 -------------------------------------------------------------------------------- HxROS Details Patient Name: Date of Service: [name], [name] 07/15/2021 1:15 PM Medical Record Number: [id number] Patient Account Number: [id number] Date of Birth/Sex: Treating RN: May 03, 1936 (85 y.o. Vincent [name] Primary Care Provider: [name] Other Clinician: Referring Provider: Treating Provider/Extender: Bryon Lions in  Treatment: 0 Information Obtained From Patient Chart Constitutional Symptoms (General Health) Complaints and Symptoms: Negative for: Fatigue; Fever; Chills; Marked Weight Change Eyes Complaints and Symptoms: Positive for: Glasses / Contacts Negative for: Dry Eyes; Vision Changes Medical History: Positive for: Cataracts - left eye; Glaucoma Negative for: Optic Neuritis Ear/Nose/Mouth/Throat Complaints and Symptoms: Negative for: Chronic sinus problems or rhinitis Respiratory Complaints and Symptoms: Positive for: Shortness of Breath Negative for: Chronic or frequent coughs Medical History: Positive for: Chronic Obstructive Pulmonary Disease (COPD) Past Medical History Notes: pulmonary eosinophilia Cardiovascular Complaints and Symptoms: Negative for: Chest pain Medical History: Positive for: Arrhythmia - afib; Congestive Heart Failure; Coronary Artery Disease; Hypertension Past Medical History Notes: hyperlipidemia, alcoholic cardiomyopathy Gastrointestinal Complaints and Symptoms: Negative for: Frequent diarrhea; Nausea; Vomiting Medical History: Past Medical History Notes: GERD Genitourinary Complaints and Symptoms: Positive for: Frequent urination Medical History: Negative for: End Stage Renal Disease Past Medical History Notes: incontinence, enlarged prostate Integumentary (Skin) Complaints and Symptoms: Positive for: Wounds - bil lower legs Medical History: Negative for: History of Burn Musculoskeletal Complaints and Symptoms: Negative for: Muscle Pain; Muscle Weakness Medical History: Positive for: Gout Neurologic Complaints and Symptoms: Negative for: Numbness/parasthesias Medical History: Positive for: Neuropathy Past Medical History Notes: left foot drop Psychiatric Complaints and Symptoms: Negative for:  Claustrophobia; Suicidal Medical History: Negative for: Anorexia/bulimia; Confinement Anxiety Hematologic/Lymphatic Endocrine Medical  History: Positive for: Type II Diabetes Negative for: Type I Diabetes Time with diabetes: 2 yrs Treated with: Oral agents Blood sugar tested every day: Yes Tested : Immunological Oncologic Medical History: Negative for: Received Chemotherapy; Received Radiation HBO Extended History Items Eyes: Eyes: Cataracts Glaucoma Immunizations Pneumococcal Vaccine: Received Pneumococcal Vaccination: Yes Received Pneumococcal Vaccination On or After 60th Birthday: Yes Implantable Devices No devices added Hospitalization / Surgery History Type of Hospitalization/Surgery cardiac cath with stents colonoscopy knee surgery Family and Social History Cancer: Yes - Mother,Siblings; Diabetes: No; Heart Disease: Yes - Father; Hereditary Spherocytosis: No; Hypertension: Yes - Mother,Father; Kidney Disease: No; Lung Disease: No; Seizures: No; Stroke: No; Thyroid Problems: Yes - Siblings; Tuberculosis: No; Former smoker; Marital Status - Divorced; Alcohol Use: Never; Drug Use: No History; Caffeine Use: Moderate - coffee; Financial Concerns: No; Food, Clothing or Shelter Needs: No; Support System Lacking: No; Transportation Concerns: No Electronic Signature(s) Signed: 07/15/2021 5:16:34 PM By: Linton Ham MD Signed: 07/15/2021 5:41:09 PM By: Baruch Gouty RN, BSN Entered By: Baruch Gouty on 07/15/2021 14:25:45 -------------------------------------------------------------------------------- SuperBill Details Patient Name: Date of Service: [name], [name] 07/15/2021 Medical Record Number: [id number] Patient Account Number: [id number] Date of Birth/Sex: Treating RN: [birth year] (85 y.o. Vincent [name] Primary Care Provider: [name] Other Clinician: Referring Provider: Treating Provider/Extender: Lajean Silvius, Fransisca Connors in Treatment: 0 Diagnosis Coding ICD-10 Codes Code Description [phone] Non-pressure chronic ulcer of other part of right lower leg with other  specified severity L97.828 Non-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 leg with other specified severity I87.333 Chronic venous hypertension (idiopathic) with ulcer and inflammation of bilateral lower extremity E11.51 Type 2 diabetes mellitus with diabetic peripheral angiopathy without gangrene Facility Procedures CPT4 Code: 29021115 Description: 99214 - WOUND CARE VISIT-LEV 4 EST PT Modifier: Quantity: 1 Physician Procedures : CPT4 Code Description Modifier [id number] - WC PHYS LEVEL 4 - NEW PT ICD-10 Diagnosis Description L97.818 Non-pressure chronic ulcer of other part of right lower leg with other specified severity L97.828 Non-pressure chronic ulcer of other part of  left lower leg with other specified severity I87.333 Chronic venous hypertension (idiopathic) with ulcer and inflammation of bilateral lower extremity E11.51 Type 2 diabetes mellitus with diabetic peripheral angiopathy without gangrene Quantity: 1 Electronic Signature(s) Signed: 07/15/2021 5:41:09 PM By: Baruch Gouty RN, BSN Signed: 07/28/2021 4:53:46 PM By: Linton Ham MD Previous Signature: 07/15/2021 5:16:34 PM Version By: Linton Ham MD Entered By: Baruch Gouty on 07/15/2021 17:23:08

## 2021-08-04 ENCOUNTER — Encounter (HOSPITAL_BASED_OUTPATIENT_CLINIC_OR_DEPARTMENT_OTHER): Payer: Medicare Other | Attending: Physician Assistant | Admitting: Physician Assistant

## 2021-08-04 ENCOUNTER — Other Ambulatory Visit: Payer: Self-pay

## 2021-08-04 DIAGNOSIS — L97811 Non-pressure chronic ulcer of other part of right lower leg limited to breakdown of skin: Secondary | ICD-10-CM | POA: Diagnosis not present

## 2021-08-04 DIAGNOSIS — I87333 Chronic venous hypertension (idiopathic) with ulcer and inflammation of bilateral lower extremity: Secondary | ICD-10-CM | POA: Diagnosis not present

## 2021-08-04 DIAGNOSIS — E1151 Type 2 diabetes mellitus with diabetic peripheral angiopathy without gangrene: Secondary | ICD-10-CM | POA: Diagnosis not present

## 2021-08-04 DIAGNOSIS — L97522 Non-pressure chronic ulcer of other part of left foot with fat layer exposed: Secondary | ICD-10-CM | POA: Diagnosis not present

## 2021-08-04 DIAGNOSIS — L97828 Non-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 leg with other specified severity: Secondary | ICD-10-CM | POA: Insufficient documentation

## 2021-08-04 DIAGNOSIS — L97818 Non-pressure chronic ulcer of other part of right lower leg with other specified severity: Secondary | ICD-10-CM | POA: Diagnosis not present

## 2021-08-04 DIAGNOSIS — E11622 Type 2 diabetes mellitus with other skin ulcer: Secondary | ICD-10-CM | POA: Diagnosis not present

## 2021-08-04 DIAGNOSIS — L97821 Non-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 leg limited to breakdown of skin: Secondary | ICD-10-CM | POA: Diagnosis not present

## 2021-08-04 NOTE — Progress Notes (Addendum)
Vincent [name], Vincent M. ([id number]) Visit Report for 08/04/2021 Chief Complaint Document Details Patient Name: Date of Service: [name] ID M. 08/04/2021 1:45 PM Medical Record Number: [id number] Patient Account Number: [id number] Date of Birth/Sex: Treating RN: [birth year] (85 y.o. Vincent [name] Primary Care Provider: [name] Other Clinician: Referring Provider: Treating Provider/Extender: Guadalupe Maple, Karrar Weeks in Treatment: 2 Information Obtained from: Patient Chief Complaint 07/15/2021 patient is here for wounds on his bilateral lower legs Electronic Signature(s) Signed: 08/04/2021 1:46:12 PM By: Worthy Keeler PA-C Entered By: Worthy Keeler on 08/04/2021 13:46:12 -------------------------------------------------------------------------------- Debridement Details Patient Name: Date of Service: [name], [name] 08/04/2021 1:45 PM Medical Record Number: [id number] Patient Account Number: [id number] Date of Birth/Sex: Treating RN: 03/11/1936 (85 y.o. Vincent [name], Vincent [name] Primary Care Provider: [name] Other Clinician: Referring Provider: Treating Provider/Extender: Guadalupe Maple, Karrar Weeks in Treatment: 2 Debridement Performed for Assessment: Wound #1 Right,Distal,Anterior Lower Leg Performed By: Physician Worthy Keeler, PA Debridement Type: Debridement Severity of Tissue Pre Debridement: Limited to breakdown of skin Level of Consciousness (Pre-procedure): Awake and Alert Pre-procedure Verification/Time Out Yes - 14:22 Taken: Start Time: 14:23 Pain Control: Lidocaine 5% topical ointment T Area Debrided (L x W): otal 1 (cm) x 1 (cm) = 1 (cm) Tissue and other material debrided: Viable, Non-Viable, Skin: Dermis , Skin: Epidermis, Fibrin/Exudate Level: Skin/Epidermis Debridement Description: Selective/Open Wound Instrument: Curette Bleeding: Minimum Hemostasis Achieved: Pressure End Time: 14:29 Procedural Pain: 0 Post Procedural  Pain: 0 Response to Treatment: Procedure was tolerated well Level of Consciousness (Post- Awake and Alert procedure): Post Debridement Measurements of Total Wound Length: (cm) 0.2 Width: (cm) 0.2 Depth: (cm) 0.1 Volume: (cm) 0.003 Character of Wound/Ulcer Post Debridement: Improved Severity of Tissue Post Debridement: Limited to breakdown of skin Post Procedure Diagnosis Same as Pre-procedure Electronic Signature(s) Signed: 08/04/2021 4:36:51 PM By: Deon Pilling RN, BSN Signed: 08/04/2021 4:51:36 PM By: Worthy Keeler PA-C Entered By: Deon Pilling on 08/04/2021 14:30:22 -------------------------------------------------------------------------------- Debridement Details Patient Name: Date of Service: [name], [name] 08/04/2021 1:45 PM Medical Record Number: [id number] Patient Account Number: [id number] Date of Birth/Sex: Treating RN: [birth year] (85 y.o. Vincent [name], Vincent [name] Primary Care Provider: [name] Other Clinician: Referring Provider: Treating Provider/Extender: Guadalupe Maple, Karrar Weeks in Treatment: 2 Debridement Performed for Assessment: Wound #4 Left,Anterior Lower Leg Performed By: Physician Worthy Keeler, PA Debridement Type: Debridement Severity of Tissue Pre Debridement: Limited to breakdown of skin Level of Consciousness (Pre-procedure): Awake and Alert Pre-procedure Verification/Time Out Yes - 14:22 Taken: Start Time: 14:23 Pain Control: Lidocaine 5% topical ointment T Area Debrided (L x W): otal 1.5 (cm) x 1 (cm) = 1.5 (cm) Tissue and other material debrided: Viable, Non-Viable, Skin: Dermis , Skin: Epidermis, Fibrin/Exudate Level: Skin/Epidermis Debridement Description: Selective/Open Wound Instrument: Curette Bleeding: Minimum Hemostasis Achieved: Pressure End Time: 14:29 Procedural Pain: 0 Post Procedural Pain: 0 Response to Treatment: Procedure was tolerated well Level of Consciousness (Post- Awake and  Alert procedure): Post Debridement Measurements of Total Wound Length: (cm) 1.1 Width: (cm) 0.7 Depth: (cm) 0.1 Volume: (cm) 0.06 Character of Wound/Ulcer Post Debridement: Improved Severity of Tissue Post Debridement: Limited to breakdown of skin Post Procedure Diagnosis Same as Pre-procedure Electronic Signature(s) Signed: 08/04/2021 4:36:51 PM By: Deon Pilling RN, BSN Signed: 08/04/2021 4:51:36 PM By: Worthy Keeler PA-C Entered By: Deon Pilling on 08/04/2021 14:31:58 -------------------------------------------------------------------------------- Debridement Details Patient Name: Date of Service: [name], [name] 08/04/2021  1:45 PM Medical Record Number: [id number] Patient Account Number: [id number] Date of Birth/Sex: Treating RN: August 24, 1936 (85 y.o. Vincent [name], Vincent [name] Primary Care Provider: [name] Other Clinician: Referring Provider: Treating Provider/Extender: Guadalupe Maple, Karrar Weeks in Treatment: 2 Debridement Performed for Assessment: Wound #6 Left,Distal,Anterior Lower Leg Performed By: Physician Worthy Keeler, PA Debridement Type: Debridement Severity of Tissue Pre Debridement: Limited to breakdown of skin Level of Consciousness (Pre-procedure): Awake and Alert Pre-procedure Verification/Time Out Yes - 14:22 Taken: Start Time: 14:23 Pain Control: Lidocaine 5% topical ointment T Area Debrided (L x W): otal 1.3 (cm) x 1 (cm) = 1.3 (cm) Tissue and other material debrided: Viable, Non-Viable, Skin: Dermis , Skin: Epidermis, Fibrin/Exudate Level: Skin/Epidermis Debridement Description: Selective/Open Wound Instrument: Curette Bleeding: Minimum Hemostasis Achieved: Pressure End Time: 14:29 Procedural Pain: 0 Post Procedural Pain: 0 Response to Treatment: Procedure was tolerated well Level of Consciousness (Post- Awake and Alert procedure): Post Debridement Measurements of Total Wound Length: (cm) 1.3 Width: (cm) 1 Depth: (cm)  0.1 Volume: (cm) 0.102 Character of Wound/Ulcer Post Debridement: Improved Severity of Tissue Post Debridement: Limited to breakdown of skin Post Procedure Diagnosis Same as Pre-procedure Electronic Signature(s) Signed: 08/04/2021 4:36:51 PM By: Deon Pilling RN, BSN Signed: 08/04/2021 4:51:36 PM By: Worthy Keeler PA-C Entered By: Deon Pilling on 08/04/2021 14:32:56 -------------------------------------------------------------------------------- Debridement Details Patient Name: Date of Service: [name], [name] 08/04/2021 1:45 PM Medical Record Number: [id number] Patient Account Number: [id number] Date of Birth/Sex: Treating RN: Jan 15, 1936 (85 y.o. Vincent [name], Vincent [name] Primary Care Provider: [name] Other Clinician: Referring Provider: Treating Provider/Extender: Guadalupe Maple, Karrar Weeks in Treatment: 2 Debridement Performed for Assessment: Wound #9 Left,Dorsal Foot Performed By: Physician Worthy Keeler, PA Debridement Type: Debridement Severity of Tissue Pre Debridement: Fat layer exposed Level of Consciousness (Pre-procedure): Awake and Alert Pre-procedure Verification/Time Out Yes - 14:22 Taken: Start Time: 14:23 Pain Control: Lidocaine 5% topical ointment T Area Debrided (L x W): otal 0.4 (cm) x 0.3 (cm) = 0.12 (cm) Tissue and other material debrided: Viable, Non-Viable, Eschar, Slough, Subcutaneous, Skin: Dermis , Skin: Epidermis, Fibrin/Exudate, Slough Level: Skin/Subcutaneous Tissue Debridement Description: Excisional Instrument: Curette Bleeding: Minimum Hemostasis Achieved: Pressure End Time: 14:29 Procedural Pain: 0 Post Procedural Pain: 0 Response to Treatment: Procedure was tolerated well Level of Consciousness (Post- Awake and Alert procedure): Post Debridement Measurements of Total Wound Length: (cm) 0.4 Width: (cm) 0.3 Depth: (cm) 0.1 Volume: (cm) 0.009 Character of Wound/Ulcer Post Debridement: Improved Severity of Tissue  Post Debridement: Fat layer exposed Post Procedure Diagnosis Same as Pre-procedure Electronic Signature(s) Signed: 08/04/2021 4:36:51 PM By: Deon Pilling RN, BSN Signed: 08/04/2021 4:51:36 PM By: Worthy Keeler PA-C Entered By: Deon Pilling on 08/04/2021 14:33:27 -------------------------------------------------------------------------------- HPI Details Patient Name: Date of Service: [name], [name] 08/04/2021 1:45 PM Medical Record Number: [id number] Patient Account Number: [id number] Date of Birth/Sex: Treating RN: 03/08/1936 (85 y.o. Vincent [name] Primary Care Provider: [name] Other Clinician: Referring Provider: Treating Provider/Extender: Guadalupe Maple, Karrar Weeks in Treatment: 2 History of Present Illness HPI Description: ADMISSION 07/15/2021 This is an 85 year old man who came here for evaluation of wounds on his bilateral predominantly anterior lower legs mid aspect of the tibia. He seems to have been dealing with this for most of this year. Currently with 3 wounds on the right and 2 on the left. At least some of these seem to start off as blisters probably the majority. He has been following with  Dr. Anabel Bene of [facility] dermatology according the patient back he has an appointment Dr. Anabel Bene tomorrow he was given a prescription for triamcinolone which she has been applying daily in fact it was renewed yesterday. Patient is a diabetic no recent hemoglobin A1c. He is on Coumadin for persistent atrial fibrillation. Vein and vascular in December 2021 at which time he had bilateral foot pain and finger pain. ABIs bilaterally were noncompressible however the TBI on the right was only 0.39. There were biphasic waveforms on the left his ABI was noncompressible with biphasic waveforms but with a TBI of 0.6. He was not felt to have a primary vascular issue. There is a [name] of Raynaud's phenomenon as well. Past medical history includes cellulitis of the  right leg in June 2022, type 2 diabetes, persistent lower extremity edema, bilateral foot and ankle pain, Raynaud's phenomenon, coronary artery disease, congestive heart failure, pacemaker, none sustained ventricular tachycardia, remote CVA paroxysmal atrial fib on Coumadin. We did not reattempt ABIs in our clinic 07/22/2019 patient appears to be doing decently well in regard to his wounds as compared to last week with the wounds that were present last week. Fortunately I do not see any signs of active infection which is great news. No fevers, chills, nausea, vomiting, or diarrhea. With that being said he has several new blisters that are getting need to be addressed today. 07/28/2021 upon evaluation today patient actually appears to be making excellent progress. I am extremely pleased with where we stand today. I think that his wounds are significantly improved compared to where they were previous. 08/04/2021 upon evaluation today patient appears to be doing much better in regard to his wounds in general. I feel like across the board he is showing signs of significant improvement. Fortunately there is no evidence of infection which is great news. No fevers, chills, nausea, vomiting, or diarrhea. Electronic Signature(s) Signed: 08/04/2021 2:37:20 PM By: Worthy Keeler PA-C Entered By: Worthy Keeler on 08/04/2021 14:37:20 -------------------------------------------------------------------------------- Physical Exam Details Patient Name: Date of Service: [name], [name] 08/04/2021 1:45 PM Medical Record Number: [id number] Patient Account Number: [id number] Date of Birth/Sex: Treating RN: Mar 21, 1936 (85 y.o. Vincent [name] Primary Care Provider: [name] Other Clinician: Referring Provider: Treating Provider/Extender: Guadalupe Maple, Karrar Weeks in Treatment: 2 Constitutional Well-nourished and well-hydrated in no acute distress. Respiratory normal breathing without  difficulty. Psychiatric this patient is able to make decisions and demonstrates good insight into disease process. Alert and Oriented x 3. pleasant and cooperative. Notes Patient's wounds did require sharp debridement in several locations most of this was very superficial I did have to go a little deeper on the foot to clear away some of this necrotic debris and eschar he tolerated that with minimal discomfort that was the one area that did bother him a little bit. Postdebridement everything appears to be doing better today. Electronic Signature(s) Signed: 08/04/2021 2:37:37 PM By: Worthy Keeler PA-C Entered By: Worthy Keeler on 08/04/2021 14:37:37 -------------------------------------------------------------------------------- Physician Orders Details Patient Name: Date of Service: [name], [name] 08/04/2021 1:45 PM Medical Record Number: [id number] Patient Account Number: [id number] Date of Birth/Sex: Treating RN: 1936-03-10 (85 y.o. [name] Primary Care Provider: [name] Other Clinician: Referring Provider: Treating Provider/Extender: Guadalupe Maple, Karrar Weeks in Treatment: 2 Verbal / Phone Orders: No Diagnosis Coding ICD-10 Coding Code Description [id number] Non-pressure chronic ulcer of other part of right lower leg with other specified severity L97.828  Non-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 leg with other specified severity I87.333 Chronic venous hypertension (idiopathic) with ulcer and inflammation of bilateral lower extremity E11.51 Type 2 diabetes mellitus with diabetic peripheral angiopathy without gangrene Follow-up Appointments ppointment in 1 week. Margarita Grizzle Wednesday *****extra time 60 minutes ***** Return A Bathing/ Shower/ Hygiene May shower with protection but do not get wound dressing(s) wet. Edema Control - Lymphedema / SCD / Other Bilateral Lower Extremities Elevate legs to the level of the heart or above for 30 minutes  daily and/or when sitting, a frequency of: - whenever sitting Avoid standing for long periods of time. Exercise regularly Wound Treatment Wound #1 - Lower Leg Wound Laterality: Right, Anterior, Distal Peri-Wound Care: Triamcinolone 15 (g) 1 x Per Week/15 Days Discharge Instructions: Use triamcinolone 15 (g) mixed with lotion to lower legs Peri-Wound Care: Sween Lotion (Moisturizing lotion) 1 x Per Week/15 Days Discharge Instructions: Apply moisturizing lotion as directed Prim Dressing: KerraCel Ag Gelling Fiber Dressing, 2x2 in (silver alginate) 1 x Per Week/15 Days ary Discharge Instructions: Apply silver alginate to wound bed as instructed Secondary Dressing: Woven Gauze Sponge, Non-Sterile 4x4 in 1 x Per Week/15 Days Discharge Instructions: Apply over primary dressing as directed. Compression Wrap: Kerlix Roll 4.5x3.1 (in/yd) 1 x Per Week/15 Days Discharge Instructions: Apply Kerlix and Coban compression as directed. Compression Wrap: Coban Self-Adherent Wrap 4x5 (in/yd) 1 x Per Week/15 Days Discharge Instructions: Apply over Kerlix as directed. Wound #4 - Lower Leg Wound Laterality: Left, Anterior Peri-Wound Care: Triamcinolone 15 (g) 1 x Per Week/15 Days Discharge Instructions: Use triamcinolone 15 (g) mixed with lotion to lower legs Peri-Wound Care: Sween Lotion (Moisturizing lotion) 1 x Per Week/15 Days Discharge Instructions: Apply moisturizing lotion as directed Prim Dressing: KerraCel Ag Gelling Fiber Dressing, 2x2 in (silver alginate) 1 x Per Week/15 Days ary Discharge Instructions: Apply silver alginate to wound bed as instructed Secondary Dressing: Woven Gauze Sponge, Non-Sterile 4x4 in 1 x Per Week/15 Days Discharge Instructions: Apply over primary dressing as directed. Compression Wrap: Kerlix Roll 4.5x3.1 (in/yd) 1 x Per Week/15 Days Discharge Instructions: Apply Kerlix and Coban compression as directed. Compression Wrap: Coban Self-Adherent Wrap 4x5 (in/yd) 1 x Per  Week/15 Days Discharge Instructions: Apply over Kerlix as directed. Wound #6 - Lower Leg Wound Laterality: Left, Anterior, Distal Peri-Wound Care: Triamcinolone 15 (g) 1 x Per Week/15 Days Discharge Instructions: Use triamcinolone 15 (g) mixed with lotion to lower legs Peri-Wound Care: Sween Lotion (Moisturizing lotion) 1 x Per Week/15 Days Discharge Instructions: Apply moisturizing lotion as directed Prim Dressing: KerraCel Ag Gelling Fiber Dressing, 2x2 in (silver alginate) 1 x Per Week/15 Days ary Discharge Instructions: Apply silver alginate to wound bed as instructed Secondary Dressing: Woven Gauze Sponge, Non-Sterile 4x4 in 1 x Per Week/15 Days Discharge Instructions: Apply over primary dressing as directed. Compression Wrap: Kerlix Roll 4.5x3.1 (in/yd) 1 x Per Week/15 Days Discharge Instructions: Apply Kerlix and Coban compression as directed. Compression Wrap: Coban Self-Adherent Wrap 4x5 (in/yd) 1 x Per Week/15 Days Discharge Instructions: Apply over Kerlix as directed. Wound #9 - Foot Wound Laterality: Dorsal, Left Peri-Wound Care: Triamcinolone 15 (g) 1 x Per Week/15 Days Discharge Instructions: Use triamcinolone 15 (g) mixed with lotion to lower legs Peri-Wound Care: Sween Lotion (Moisturizing lotion) 1 x Per Week/15 Days Discharge Instructions: Apply moisturizing lotion as directed Prim Dressing: KerraCel Ag Gelling Fiber Dressing, 2x2 in (silver alginate) 1 x Per Week/15 Days ary Discharge Instructions: Apply silver alginate to wound bed as instructed  Secondary Dressing: Woven Gauze Sponge, Non-Sterile 4x4 in 1 x Per Week/15 Days Discharge Instructions: Apply over primary dressing as directed. Compression Wrap: Kerlix Roll 4.5x3.1 (in/yd) 1 x Per Week/15 Days Discharge Instructions: Apply Kerlix and Coban compression as directed. Compression Wrap: Coban Self-Adherent Wrap 4x5 (in/yd) 1 x Per Week/15 Days Discharge Instructions: Apply over Kerlix as directed. Electronic  Signature(s) Signed: 08/04/2021 4:36:51 PM By: Deon Pilling RN, BSN Signed: 08/04/2021 4:51:36 PM By: Worthy Keeler PA-C Entered By: Deon Pilling on 08/04/2021 14:35:02 -------------------------------------------------------------------------------- Problem List Details Patient Name: Date of Service: [name], [name] 08/04/2021 1:45 PM Medical Record Number: [id number] Patient Account Number: [id number] Date of Birth/Sex: Treating RN: 1936-01-18 (85 y.o. Vincent [name] Primary Care Provider: [name] Other Clinician: Referring Provider: Treating Provider/Extender: Guadalupe Maple, Karrar Weeks in Treatment: 2 Active Problems ICD-10 Encounter Code Description Active Date MDM Diagnosis L97.818 Non-pressure chronic ulcer of other part of right lower leg with other specified 07/15/2021 No Yes severity L97.828 Non-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 leg with other specified 07/15/2021 No Yes severity L97.522 Non-pressure chronic ulcer of other part of left foot with fat layer exposed 08/04/2021 No Yes I87.333 Chronic venous hypertension (idiopathic) with ulcer and inflammation of 07/15/2021 No Yes bilateral lower extremity E11.51 Type 2 diabetes mellitus with diabetic peripheral angiopathy without gangrene 07/15/2021 No Yes Inactive Problems Resolved Problems Electronic Signature(s) Signed: 08/04/2021 2:39:01 PM By: Worthy Keeler PA-C Previous Signature: 08/04/2021 1:46:03 PM Version By: Worthy Keeler PA-C Entered By: Worthy Keeler on 08/04/2021 14:39:01 -------------------------------------------------------------------------------- Progress Note Details Patient Name: Date of Service: [name], [name] 08/04/2021 1:45 PM Medical Record Number: [id number] Patient Account Number: [id number] Date of Birth/Sex: Treating RN: 02/23/1936 (85 y.o. Vincent [name] Primary Care Provider: [name] Other Clinician: Referring Provider: Treating  Provider/Extender: Guadalupe Maple, Karrar Weeks in Treatment: 2 Subjective Chief Complaint Information obtained from Patient 07/15/2021 patient is here for wounds on his bilateral lower legs History of Present Illness (HPI) ADMISSION 07/15/2021 This is an 85 year old man who came here for evaluation of wounds on his bilateral predominantly anterior lower legs mid aspect of the tibia. He seems to have been dealing with this for most of this year. Currently with 3 wounds on the right and 2 on the left. At least some of these seem to start off as blisters probably the majority. He has been following with Dr. Anabel Bene of [facility] dermatology according the patient back he has an appointment Dr. Anabel Bene tomorrow he was given a prescription for triamcinolone which she has been applying daily in fact it was renewed yesterday. Patient is a diabetic no recent hemoglobin A1c. He is on Coumadin for persistent atrial fibrillation. Vein and vascular in December 2021 at which time he had bilateral foot pain and finger pain. ABIs bilaterally were noncompressible however the TBI on the right was only 0.39. There were biphasic waveforms on the left his ABI was noncompressible with biphasic waveforms but with a TBI of 0.6. He was not felt to have a primary vascular issue. There is a [name] of Raynaud's phenomenon as well. Past medical history includes cellulitis of the right leg in June 2022, type 2 diabetes, persistent lower extremity edema, bilateral foot and ankle pain, Raynaud's phenomenon, coronary artery disease, congestive heart failure, pacemaker, none sustained ventricular tachycardia, remote CVA paroxysmal atrial fib on Coumadin. We did not reattempt ABIs in our clinic 07/22/2019 patient appears to be  doing decently well in regard to his wounds as compared to last week with the wounds that were present last week. Fortunately I do not see any signs of active infection which is great news. No  fevers, chills, nausea, vomiting, or diarrhea. With that being said he has several new blisters that are getting need to be addressed today. 07/28/2021 upon evaluation today patient actually appears to be making excellent progress. I am extremely pleased with where we stand today. I think that his wounds are significantly improved compared to where they were previous. 08/04/2021 upon evaluation today patient appears to be doing much better in regard to his wounds in general. I feel like across the board he is showing signs of significant improvement. Fortunately there is no evidence of infection which is great news. No fevers, chills, nausea, vomiting, or diarrhea. Objective Constitutional Well-nourished and well-hydrated in no acute distress. Vitals Time Taken: 1:50 PM, Height: 69 in, Weight: 185 lbs, BMI: 27.3, Temperature: 97.5 F, Pulse: 94 bpm, Respiratory Rate: 18 breaths/min, Blood Pressure: 109/76 mmHg, Capillary Blood Glucose: 120 mg/dl. Respiratory normal breathing without difficulty. Psychiatric this patient is able to make decisions and demonstrates good insight into disease process. Alert and Oriented x 3. pleasant and cooperative. General Notes: Patient's wounds did require sharp debridement in several locations most of this was very superficial I did have to go a little deeper on the foot to clear away some of this necrotic debris and eschar he tolerated that with minimal discomfort that was the one area that did bother him a little bit. Postdebridement everything appears to be doing better today. Integumentary (Hair, Skin) Wound #1 status is Open. Original cause of wound was Gradually Appeared. The date acquired was: 04/29/2021. The wound has been in treatment 2 weeks. The wound is located on the Right,Distal,Anterior Lower Leg. The wound measures 0.2cm length x 0.2cm width x 0.2cm depth; 0.031cm^2 area and 0.006cm^3 volume. There is Fat Layer (Subcutaneous Tissue) exposed. There  is no tunneling or undermining noted. There is a medium amount of serosanguineous drainage noted. The wound margin is flat and intact. There is large (67-100%) red granulation within the wound bed. There is a small (1-33%) amount of necrotic tissue within the wound bed including Adherent Slough. Wound #3 status is Open. Original cause of wound was Blister. The date acquired was: 04/29/2021. The wound has been in treatment 2 weeks. The wound is located on the Right,Lateral Lower Leg. The wound measures 0cm length x 0cm width x 0cm depth; 0cm^2 area and 0cm^3 volume. There is Fat Layer (Subcutaneous Tissue) exposed. There is a medium amount of serous drainage noted. The wound margin is flat and intact. There is large (67-100%) red granulation within the wound bed. There is no necrotic tissue within the wound bed. Wound #4 status is Open. Original cause of wound was Blister. The date acquired was: 04/29/2021. The wound has been in treatment 2 weeks. The wound is located on the Left,Anterior Lower Leg. The wound measures 1.1cm length x 0.7cm width x 0.1cm depth; 0.605cm^2 area and 0.06cm^3 volume. There is Fat Layer (Subcutaneous Tissue) exposed. There is no tunneling or undermining noted. There is a medium amount of serous drainage noted. The wound margin is flat and intact. There is large (67-100%) red granulation within the wound bed. There is no necrotic tissue within the wound bed. Wound #6 status is Open. Original cause of wound was Blister. The date acquired was: 07/21/2021. The wound has been in treatment 2 weeks.  The wound is located on the [facility] Lower Leg. The wound measures 1.3cm length x 1cm width x 0.1cm depth; 1.021cm^2 area and 0.102cm^3 volume. There is Fat Layer (Subcutaneous Tissue) exposed. There is no tunneling or undermining noted. There is a medium amount of serosanguineous drainage noted. The wound margin is distinct with the outline attached to the wound base. There is  large (67-100%) red granulation within the wound bed. There is no necrotic tissue within the wound bed. Wound #7 status is Healed - Epithelialized. Original cause of wound was Blister. The date acquired was: 07/21/2021. The wound has been in treatment 2 weeks. The wound is located on the Right,Anterior Lower Leg. The wound measures 0cm length x 0cm width x 0cm depth; 0cm^2 area and 0cm^3 volume. There is Fat Layer (Subcutaneous Tissue) exposed. There is no tunneling or undermining noted. There is a medium amount of serosanguineous drainage noted. The wound margin is distinct with the outline attached to the wound base. There is no granulation within the wound bed. There is a large (67-100%) amount of necrotic tissue within the wound bed including Eschar. Wound #9 status is Open. Original cause of wound was Gradually Appeared. The date acquired was: 08/04/2021. The wound is located on the Left,Dorsal Foot. The wound measures 0.4cm length x 0.3cm width x 0.1cm depth; 0.094cm^2 area and 0.009cm^3 volume. There is Fat Layer (Subcutaneous Tissue) exposed. There is no tunneling or undermining noted. There is a medium amount of serosanguineous drainage noted. The wound margin is distinct with the outline attached to the wound base. There is no granulation within the wound bed. There is a large (67-100%) amount of necrotic tissue within the wound bed including Eschar and Adherent Slough. 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 There was a Selective/Open Wound Skin/Epidermis Debridement with a total area of 1 sq cm performed by Worthy Keeler, PA. With the following instrument(s): Curette to remove Viable and Non-Viable tissue/material. Material removed includes Skin: Dermis, Skin: Epidermis, and Fibrin/Exudate after achieving pain control using Lidocaine 5% topical ointment. A time out was conducted at 14:22, prior to the start of the procedure. A Minimum amount of bleeding was controlled with Pressure. The procedure was tolerated well with a pain level of 0 throughout and a pain level of 0 following the procedure. Post Debridement Measurements: 0.2cm length x 0.2cm width x 0.1cm depth; 0.003cm^3 volume. Character of Wound/Ulcer Post Debridement is improved. Severity of Tissue Post Debridement is: Limited to breakdown of skin. Post procedure Diagnosis Wound #1: Same as Pre-Procedure Wound #4 Pre-procedure diagnosis of Wound #4 is a Diabetic Wound/Ulcer of the Lower Extremity located on the Left,Anterior Lower Leg .Severity of Tissue Pre Debridement is: Limited to breakdown of skin. There was a Selective/Open Wound Skin/Epidermis Debridement with a total area of 1.5 sq cm performed by Worthy Keeler, PA. With the following instrument(s): Curette to remove Viable and Non-Viable tissue/material. Material removed includes Skin: Dermis, Skin: Epidermis, and  Fibrin/Exudate after achieving pain control using Lidocaine 5% topical ointment. A time out was conducted at 14:22, prior to the start of the procedure. A Minimum amount of bleeding was controlled with Pressure. The procedure was tolerated well with a pain level of 0 throughout and a pain level of 0 following the procedure. Post Debridement Measurements: 1.1cm length x 0.7cm width x 0.1cm depth; 0.06cm^3  volume. Character of Wound/Ulcer Post Debridement is improved. Severity of Tissue Post Debridement is: Limited to breakdown of skin. Post procedure Diagnosis Wound #4: Same as Pre-Procedure Wound #6 Pre-procedure diagnosis of Wound #6 is a Diabetic Wound/Ulcer of the Lower Extremity located on the Left,Distal,Anterior Lower Leg .Severity of Tissue Pre Debridement is: Limited to breakdown of skin. There was a Selective/Open Wound Skin/Epidermis Debridement with a total area of 1.3 sq cm performed by Worthy Keeler, PA. With the following instrument(s): Curette to remove Viable and Non-Viable tissue/material. Material removed includes Skin: Dermis, Skin: Epidermis, and Fibrin/Exudate after achieving pain control using Lidocaine 5% topical ointment. A time out was conducted at 14:22, prior to the start of the procedure. A Minimum amount of bleeding was controlled with Pressure. The procedure was tolerated well with a pain level of 0 throughout and a pain level of 0 following the procedure. Post Debridement Measurements: 1.3cm length x 1cm width x 0.1cm depth; 0.102cm^3 volume. Character of Wound/Ulcer Post Debridement is improved. Severity of Tissue Post Debridement is: Limited to breakdown of skin. Post procedure Diagnosis Wound #6: Same as Pre-Procedure Wound #9 Pre-procedure diagnosis of Wound #9 is a Diabetic Wound/Ulcer of the Lower Extremity located on the Left,Dorsal Foot .Severity of Tissue Pre Debridement is: Fat layer exposed. There was a Excisional Skin/Subcutaneous Tissue Debridement with a total area of 0.12 sq cm performed by Worthy Keeler, PA. With the following instrument(s): Curette to remove Viable and Non-Viable tissue/material. Material removed includes Eschar, Subcutaneous Tissue, Slough, Skin: Dermis, Skin: Epidermis, and Fibrin/Exudate after achieving pain control using Lidocaine 5% topical ointment. A time out was conducted at 14:22, prior to the start of the procedure. A  Minimum amount of bleeding was controlled with Pressure. The procedure was tolerated well with a pain level of 0 throughout and a pain level of 0 following the procedure. Post Debridement Measurements: 0.4cm length x 0.3cm width x 0.1cm depth; 0.009cm^3 volume. Character of Wound/Ulcer Post Debridement is improved. Severity of Tissue Post Debridement is: Fat layer exposed. Post procedure Diagnosis Wound #9: Same as Pre-Procedure Plan Follow-up Appointments: Return Appointment in 1 week. Margarita Grizzle Wednesday *****extra time 60 minutes ***** Bathing/ Shower/ Hygiene: May shower with protection but do not get wound dressing(s) wet. Edema Control - Lymphedema / SCD / Other: Elevate legs to the level of the heart or above for 30 minutes daily and/or when sitting, a frequency of: - whenever sitting Avoid standing for long periods of time. Exercise regularly WOUND #1: - Lower Leg Wound Laterality: Right, Anterior, Distal Peri-Wound Care: Triamcinolone 15 (g) 1 x Per Week/15 Days Discharge Instructions: Use triamcinolone 15 (g) mixed with lotion to lower legs Peri-Wound Care: Sween Lotion (Moisturizing lotion) 1 x Per Week/15 Days Discharge Instructions: Apply moisturizing lotion as directed Prim Dressing: KerraCel Ag Gelling Fiber Dressing, 2x2 in (silver alginate) 1 x Per Week/15 Days ary Discharge Instructions: Apply silver alginate to wound bed as instructed Secondary Dressing: Woven Gauze Sponge, Non-Sterile 4x4 in 1 x Per Week/15 Days Discharge Instructions: Apply over primary dressing as directed. Com pression Wrap: Kerlix Roll 4.5x3.1 (in/yd) 1  x Per Week/15 Days Discharge Instructions: Apply Kerlix and Coban compression as directed. Com pression Wrap: Coban Self-Adherent Wrap 4x5 (in/yd) 1 x Per Week/15 Days Discharge Instructions: Apply over Kerlix as directed. WOUND #4: - Lower Leg Wound Laterality: Left, Anterior Peri-Wound Care: Triamcinolone 15 (g) 1 x Per Week/15 Days Discharge  Instructions: Use triamcinolone 15 (g) mixed with lotion to lower legs Peri-Wound Care: Sween Lotion (Moisturizing lotion) 1 x Per Week/15 Days Discharge Instructions: Apply moisturizing lotion as directed Prim Dressing: KerraCel Ag Gelling Fiber Dressing, 2x2 in (silver alginate) 1 x Per Week/15 Days ary Discharge Instructions: Apply silver alginate to wound bed as instructed Secondary Dressing: Woven Gauze Sponge, Non-Sterile 4x4 in 1 x Per Week/15 Days Discharge Instructions: Apply over primary dressing as directed. Com pression Wrap: Kerlix Roll 4.5x3.1 (in/yd) 1 x Per Week/15 Days Discharge Instructions: Apply Kerlix and Coban compression as directed. Com pression Wrap: Coban Self-Adherent Wrap 4x5 (in/yd) 1 x Per Week/15 Days Discharge Instructions: Apply over Kerlix as directed. WOUND #6: - Lower Leg Wound Laterality: Left, Anterior, Distal Peri-Wound Care: Triamcinolone 15 (g) 1 x Per Week/15 Days Discharge Instructions: Use triamcinolone 15 (g) mixed with lotion to lower legs Peri-Wound Care: Sween Lotion (Moisturizing lotion) 1 x Per Week/15 Days Discharge Instructions: Apply moisturizing lotion as directed Prim Dressing: KerraCel Ag Gelling Fiber Dressing, 2x2 in (silver alginate) 1 x Per Week/15 Days ary Discharge Instructions: Apply silver alginate to wound bed as instructed Secondary Dressing: Woven Gauze Sponge, Non-Sterile 4x4 in 1 x Per Week/15 Days Discharge Instructions: Apply over primary dressing as directed. Com pression Wrap: Kerlix Roll 4.5x3.1 (in/yd) 1 x Per Week/15 Days Discharge Instructions: Apply Kerlix and Coban compression as directed. Com pression Wrap: Coban Self-Adherent Wrap 4x5 (in/yd) 1 x Per Week/15 Days Discharge Instructions: Apply over Kerlix as directed. WOUND #9: - Foot Wound Laterality: Dorsal, Left Peri-Wound Care: Triamcinolone 15 (g) 1 x Per Week/15 Days Discharge Instructions: Use triamcinolone 15 (g) mixed with lotion to lower  legs Peri-Wound Care: Sween Lotion (Moisturizing lotion) 1 x Per Week/15 Days Discharge Instructions: Apply moisturizing lotion as directed Prim Dressing: KerraCel Ag Gelling Fiber Dressing, 2x2 in (silver alginate) 1 x Per Week/15 Days ary Discharge Instructions: Apply silver alginate to wound bed as instructed Secondary Dressing: Woven Gauze Sponge, Non-Sterile 4x4 in 1 x Per Week/15 Days Discharge Instructions: Apply over primary dressing as directed. Com pression Wrap: Kerlix Roll 4.5x3.1 (in/yd) 1 x Per Week/15 Days Discharge Instructions: Apply Kerlix and Coban compression as directed. Com pression Wrap: Coban Self-Adherent Wrap 4x5 (in/yd) 1 x Per Week/15 Days Discharge Instructions: Apply over Kerlix as directed. 1. Would recommend that we going to continue with the wound care measures as before and the patient is in agreement with plan this includes the use of the silver alginate dressings which I think are doing quite well for him. 2. I am also can recommend that we have the patient continue with the Curlex and Coban wrap which seems to have done well up to this point. 3. I would also suggest that we continue to monitor for any signs of worsening or infection if anything changes he should let me know soon as possible. Mainly he will be concerned about increased pain. We will see patient back for reevaluation in 1 week here in the clinic. If anything worsens or changes patient will contact our office for additional recommendations. Electronic Signature(s) Signed: 08/04/2021 2:40:29 PM By: Worthy Keeler PA-C Previous Signature: 08/04/2021 2:38:23 PM Version By: Joaquim Lai  III, Ninfa Giannelli PA-C Entered By: Worthy Keeler on 08/04/2021 14:40:28 -------------------------------------------------------------------------------- SuperBill Details Patient Name: Date of Service: [name], [place] ID M. 08/04/2021 Medical Record Number: [id number] Patient Account Number: [id number] Date of  Birth/Sex: Treating RN: 20-Mar-1936 (85 y.o. Vincent [name] Primary Care Provider: [name] Other Clinician: Referring Provider: Treating Provider/Extender: Guadalupe Maple, Karrar Weeks in Treatment: 2 Diagnosis Coding ICD-10 Codes Code Description [phone] Non-pressure chronic ulcer of other part of right lower leg with other specified severity L97.828 Non-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 leg with other specified severity L97.522 Non-pressure chronic ulcer of other part of left foot with fat layer exposed I87.333 Chronic venous hypertension (idiopathic) with ulcer and inflammation of bilateral lower extremity E11.51 Type 2 diabetes mellitus with diabetic peripheral angiopathy without gangrene Facility Procedures CPT4 Code: [id number] Description: 38184 - DEB SUBQ TISSUE 20 SQ CM/< ICD-10 Diagnosis Description L97.522 Non-pressure chronic ulcer of other part of left foot with fat layer exposed Modifier: Quantity: 1 CPT4 Code: [id number] Description: 67703 - DEBRIDE WOUND 1ST 20 SQ CM OR < ICD-10 Diagnosis Description L97.818 Non-pressure chronic ulcer of other part of right lower leg with other specified s L97.828 Non-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 leg with other  specified se Modifier: everity verity Quantity: 1 Physician Procedures : CPT4 Code Description Modifier [id number] 11042 - WC PHYS SUBQ TISS 20 SQ CM ICD-10 Diagnosis Description L97.522 Non-pressure chronic ulcer of other part of left foot with fat layer exposed Quantity: 1 : [id number] 11216 - WC PHYS DEBR WO ANESTH 20 SQ CM ICD-10 Diagnosis Description L97.818 Non-pressure chronic ulcer of other part of right lower leg with other specified severity L97.828 Non-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 leg with other  specified severity Quantity: 1 Electronic Signature(s) Signed: 08/04/2021 2:41:12 PM By: Worthy Keeler PA-C Entered By: Worthy Keeler on 08/04/2021  14:41:10

## 2021-08-09 DIAGNOSIS — E1142 Type 2 diabetes mellitus with diabetic polyneuropathy: Secondary | ICD-10-CM | POA: Diagnosis not present

## 2021-08-09 DIAGNOSIS — I2510 Atherosclerotic heart disease of native coronary artery without angina pectoris: Secondary | ICD-10-CM | POA: Diagnosis not present

## 2021-08-09 DIAGNOSIS — E785 Hyperlipidemia, unspecified: Secondary | ICD-10-CM | POA: Diagnosis not present

## 2021-08-09 DIAGNOSIS — I10 Essential (primary) hypertension: Secondary | ICD-10-CM | POA: Diagnosis not present

## 2021-08-09 NOTE — Progress Notes (Signed)
Bayly, Jakob M. ([id number]) Visit Report for 08/04/2021 Arrival Information Details Patient Name: Date of Service: [name] ID M. 08/04/2021 1:45 PM Medical Record Number: [id number] Patient Account Number: [id number] Date of Birth/Sex: Treating RN: Apr 06, 1936 (85 y.o. [name] Primary Care Kingsly Kloepfer: [name] Other Clinician: Referring Obie Kallenbach: Treating Geniya Fulgham/Extender: Guadalupe Maple, Karrar Weeks in Treatment: 2 Visit Information History Since Last Visit Added or deleted any medications: No Patient Arrived: Ambulatory Any new allergies or adverse reactions: No Arrival Time: 13:49 Had a fall or experienced change in No Accompanied By: self activities of daily living that may affect Transfer Assistance: None risk of falls: Patient Identification Verified: Yes Signs or symptoms of abuse/neglect since last visito No Secondary Verification Process Completed: Yes Hospitalized since last visit: No Patient Requires Transmission-Based Precautions: No Implantable device outside of the clinic excluding No Patient Has Alerts: Yes cellular tissue based products placed in the center Patient Alerts: Patient on Blood Thinner since last visit: R ABI = 1.43 TBI= .39 Has Dressing in Place as Prescribed: Yes L ABI = 1.85 TBI= .6 Pain Present Now: No Electronic Signature(s) Signed: 08/09/2021 2:35:48 PM By: Sandre Kitty Entered By: Sandre Kitty on 08/04/2021 13:50:02 -------------------------------------------------------------------------------- Encounter Discharge Information Details Patient Name: Date of Service: [name], [name] 08/04/2021 1:45 PM Medical Record Number: [id number] Patient Account Number: [id number] Date of Birth/Sex: Treating RN: 09/24/1935 (85 y.o. [name] Primary Care Christinia Lambeth: [name] Other Clinician: Referring Alliah Boulanger: Treating Audel Coakley/Extender: Guadalupe Maple, Karrar Weeks in Treatment: 2 Encounter  Discharge Information Items Post Procedure Vitals Discharge Condition: Stable Temperature (F): 97.5 Ambulatory Status: Ambulatory Pulse (bpm): 94 Discharge Destination: Home Respiratory Rate (breaths/min): 18 Transportation: Private Auto Blood Pressure (mmHg): 109/76 Accompanied By: self Schedule Follow-up Appointment: Yes Clinical Summary of Care: Electronic Signature(s) Signed: 08/04/2021 4:36:51 PM By: Deon Pilling RN, BSN Entered By: Deon Pilling on 08/04/2021 14:36:50 -------------------------------------------------------------------------------- Lower Extremity Assessment Details Patient Name: Date of Service: [name], Shaune Pascal ID M. 08/04/2021 1:45 PM Medical Record Number: [id number] Patient Account Number: [id number] Date of Birth/Sex: Treating RN: 12-17-1935 (85 y.o. [name] Primary Care Sandor Arboleda: [name] Other Clinician: Referring Bianney Rockwood: Treating Evianna Chandran/Extender: Guadalupe Maple, Karrar Weeks in Treatment: 2 Edema Assessment Assessed: [Left: Yes] Patrice Paradise: Yes] Edema: [Left: Yes] [Right: Yes] Calf Left: Right: Point of Measurement: From Medial Instep 33 cm 35 cm Ankle Left: Right: Point of Measurement: From Medial Instep 22 cm 23 cm Vascular Assessment Pulses: Dorsalis Pedis Palpable: [Left:Yes] [Right:Yes] Electronic Signature(s) Signed: 08/04/2021 4:36:51 PM By: Deon Pilling RN, BSN Entered By: Deon Pilling on 08/04/2021 14:18:25 -------------------------------------------------------------------------------- [place] Details Patient Name: Date of Service: [name], [name] 08/04/2021 1:45 PM Medical Record Number: [id number] Patient Account Number: [id number] Date of Birth/Sex: Treating RN: 08-Dec-1935 (85 y.o. [name] Primary Care Dangelo Guzzetta: [name] Other Clinician: Referring Solan Vosler: Treating Kieran Arreguin/Extender: Veatrice Bourbon in Treatment: 2 [place] reviewed with physician Active Inactive Nutrition Nursing Diagnoses: Impaired glucose control: actual or potential Potential for alteratiion in Nutrition/Potential for imbalanced nutrition Goals: Patient/caregiver will maintain therapeutic glucose control Date Initiated: 07/15/2021 Target Resolution Date: 08/12/2021 Goal Status: Active Interventions: Assess HgA1c results as ordered upon admission and as needed Assess patient nutrition upon admission and as needed per policy Provide education on elevated blood sugars and impact on wound healing Treatment Activities: Dietary management education, guidance and counseling : 07/15/2021 Giving encouragement to exercise :  07/15/2021 Patient referred to Primary Care Physician for further nutritional evaluation : 07/15/2021 Notes: Venous Leg Ulcer Nursing Diagnoses: Knowledge deficit related to disease process and management Potential for venous Insuffiency (use before diagnosis confirmed) Goals: Patient will maintain optimal edema control Date Initiated: 07/15/2021 Target Resolution Date: 08/12/2021 Goal Status: Active Interventions: Assess peripheral edema status every visit. Compression as ordered Provide education on venous insufficiency Treatment Activities: Therapeutic compression applied : 07/15/2021 Notes: Wound/Skin Impairment Nursing Diagnoses: Impaired tissue integrity Knowledge deficit related to ulceration/compromised skin integrity Goals: Patient/caregiver will verbalize understanding of skin care regimen Date Initiated: 07/15/2021 Target Resolution Date: 08/12/2021 Goal Status: Active Ulcer/skin breakdown will have a volume reduction of 30% by week 4 Date Initiated: 07/15/2021 Target Resolution Date: 08/12/2021 Goal Status: Active Interventions: Assess patient/caregiver ability to obtain necessary supplies Assess patient/caregiver ability to perform ulcer/skin care regimen upon admission and as  needed Assess ulceration(s) every visit Provide education on ulcer and skin care Treatment Activities: Skin care regimen initiated : 07/15/2021 Topical wound management initiated : 07/15/2021 Notes: Electronic Signature(s) Signed: 08/04/2021 4:36:51 PM By: Deon Pilling RN, BSN Entered By: Deon Pilling on 08/04/2021 14:21:31 -------------------------------------------------------------------------------- Pain Assessment Details Patient Name: Date of Service: [name], Shaune Pascal ID M. 08/04/2021 1:45 PM Medical Record Number: [id number] Patient Account Number: [id number] Date of Birth/Sex: Treating RN: [birth year] (85 y.o. [name] Primary Care Adetokunbo Mccadden: [name] Other Clinician: Referring Yazmyne Sara: Treating Lynze Reddy/Extender: Guadalupe Maple, Karrar Weeks in Treatment: 2 Active Problems Location of Pain Severity and Description of Pain Patient Has Paino No Site Locations Pain Management and Medication Current Pain Management: Electronic Signature(s) Signed: 08/04/2021 4:49:37 PM By: Baruch Gouty RN, BSN Signed: 08/09/2021 2:35:48 PM By: Sandre Kitty Entered By: Sandre Kitty on 08/04/2021 13:50:37 -------------------------------------------------------------------------------- Patient/Caregiver Education Details Patient Name: Date of Service: DA Burgess Estelle 12/7/2022andnbsp1:45 PM Medical Record Number: [id number] Patient Account Number: [id number] Date of Birth/Gender: Treating RN: 03/13/1936 (85 y.o. [name] Primary Care Physician: [name] Other Clinician: Referring Physician: Treating Physician/Extender: Guadalupe Maple, Fransisca Connors in Treatment: 2 Education Assessment Education Provided To: Patient Education Topics Provided Wound/Skin Impairment: Handouts: Skin Care Do's and Dont's Methods: Explain/Verbal Responses: Reinforcements needed Electronic Signature(s) Signed: 08/04/2021 4:36:51 PM By: Deon Pilling  RN, BSN Entered By: Deon Pilling on 08/04/2021 14:21:57 -------------------------------------------------------------------------------- Wound Assessment Details Patient Name: Date of Service: [name], Shaune Pascal ID M. 08/04/2021 1:45 PM Medical Record Number: [id number] Patient Account Number: [id number] Date of Birth/Sex: Treating RN: 1935-09-26 (85 y.o. [name] Primary Care Aritzel Krusemark: [name] Other Clinician: Referring Mirakle Tomlin: Treating Yuma Blucher/Extender: Guadalupe Maple, Karrar Weeks in Treatment: 2 Wound Status Wound Number: 1 Primary Diabetic Wound/Ulcer of the Lower Extremity Etiology: Wound Location: Right, Distal, Anterior Lower Leg Secondary Venous Leg Ulcer Wounding Event: Gradually Appeared Etiology: Date Acquired: 04/29/2021 Wound Open Weeks Of Treatment: 2 Status: Clustered Wound: No Comorbid Cataracts, Glaucoma, Chronic Obstructive Pulmonary Disease History: (COPD), Arrhythmia, Congestive Heart Failure, Coronary Artery Disease, Hypertension, Type II Diabetes, Gout, Neuropathy Photos Wound Measurements Length: (cm) 0.2 Width: (cm) 0.2 Depth: (cm) 0.2 Area: (cm) 0.031 Volume: (cm) 0.006 % Reduction in Area: 99.5% % Reduction in Volume: 99.1% Epithelialization: Medium (34-66%) Tunneling: No Undermining: No Wound Description Classification: Grade 1 Wound Margin: Flat and Intact Exudate Amount: Medium Exudate Type: Serosanguineous Exudate Color: red, brown Foul Odor After Cleansing: No Slough/Fibrino Yes Wound Bed Granulation Amount: Large (67-100%) Exposed Structure Granulation Quality: Red Fascia Exposed: No Necrotic Amount: Small (1-33%) Fat  Layer (Subcutaneous Tissue) Exposed: Yes Necrotic Quality: Adherent Slough Tendon Exposed: No Muscle Exposed: No Joint Exposed: No Bone Exposed: No Treatment Notes Wound #1 (Lower Leg) Wound Laterality: Right, Anterior, Distal Cleanser Peri-Wound Care Triamcinolone 15 (g) Discharge  Instruction: Use triamcinolone 15 (g) mixed with lotion to lower legs Sween Lotion (Moisturizing lotion) Discharge Instruction: Apply moisturizing lotion as directed Topical Primary Dressing KerraCel Ag Gelling Fiber Dressing, 2x2 in (silver alginate) Discharge Instruction: Apply silver alginate to wound bed as instructed Secondary Dressing Woven Gauze Sponge, Non-Sterile 4x4 in Discharge Instruction: Apply over primary dressing as directed. Secured With Compression Wrap Kerlix Roll 4.5x3.1 (in/yd) Discharge Instruction: Apply Kerlix and Coban compression as directed. Coban Self-Adherent Wrap 4x5 (in/yd) Discharge Instruction: Apply over Kerlix as directed. Compression Stockings Add-Ons Electronic Signature(s) Signed: 08/04/2021 4:36:51 PM By: Deon Pilling RN, BSN Signed: 08/04/2021 4:49:37 PM By: Baruch Gouty RN, BSN Entered By: Deon Pilling on 08/04/2021 14:19:17 -------------------------------------------------------------------------------- Wound Assessment Details Patient Name: Date of Service: [name], [name] 08/04/2021 1:45 PM Medical Record Number: [id number] Patient Account Number: [id number] Date of Birth/Sex: Treating RN: [birth year] (85 y.o. [name] Primary Care Nusaybah Ivie: [name] Other Clinician: Referring Rakisha Pincock: Treating Aleria Maheu/Extender: Guadalupe Maple, Karrar Weeks in Treatment: 2 Wound Status Wound Number: 3 Primary Diabetic Wound/Ulcer of the Lower Extremity Etiology: Wound Location: Right, Lateral Lower Leg Secondary Venous Leg Ulcer Wounding Event: Blister Etiology: Date Acquired: 04/29/2021 Wound Open Weeks Of Treatment: 2 Status: Clustered Wound: No Comorbid Cataracts, Glaucoma, Chronic Obstructive Pulmonary Disease History: (COPD), Arrhythmia, Congestive Heart Failure, Coronary Artery Disease, Hypertension, Type II Diabetes, Gout, Neuropathy Photos Wound Measurements Length: (cm) Width: (cm) Depth: (cm) Area:  (cm) Volume: (cm) Wound Description Classification: Grade 1 Wound Margin: Flat and Intact Exudate Amount: Medium Exudate Type: Serous Exudate Color: amber Foul Odor After Cleansing: Slough/Fibrino 0 % Reduction in Area: 100% 0 % Reduction in Volume: 100% 0 Epithelialization: Large (67-100%) 0 0 No No Wound Bed Granulation Amount: Large (67-100%) Exposed Structure Granulation Quality: Red Fascia Exposed: No Necrotic Amount: None Present (0%) Fat Layer (Subcutaneous Tissue) Exposed: Yes Tendon Exposed: No Muscle Exposed: No Joint Exposed: No Bone Exposed: No Electronic Signature(s) Signed: 08/04/2021 4:49:37 PM By: Baruch Gouty RN, BSN Signed: 08/09/2021 2:35:48 PM By: Sandre Kitty Entered By: Sandre Kitty on 08/04/2021 14:07:16 -------------------------------------------------------------------------------- Wound Assessment Details Patient Name: Date of Service: [name], Shaune Pascal ID M. 08/04/2021 1:45 PM Medical Record Number: [id number] Patient Account Number: [id number] Date of Birth/Sex: Treating RN: 05-17-1936 (85 y.o. [name] Primary Care Aldora Perman: [name] Other Clinician: Referring Gillian Kluever: Treating Joelle Roswell/Extender: Guadalupe Maple, Karrar Weeks in Treatment: 2 Wound Status Wound Number: 4 Primary Diabetic Wound/Ulcer of the Lower Extremity Etiology: Wound Location: Left, Anterior Lower Leg Secondary Venous Leg Ulcer Wounding Event: Blister Etiology: Date Acquired: 04/29/2021 Wound Open Weeks Of Treatment: 2 Status: Clustered Wound: No Comorbid Cataracts, Glaucoma, Chronic Obstructive Pulmonary Disease History: (COPD), Arrhythmia, Congestive Heart Failure, Coronary Artery Disease, Hypertension, Type II Diabetes, Gout, Neuropathy Photos Wound Measurements Length: (cm) 1.1 Width: (cm) 0.7 Depth: (cm) 0.1 Area: (cm) 0.605 Volume: (cm) 0.06 % Reduction in Area: 3.7% % Reduction in Volume: 4.8% Epithelialization:  Medium (34-66%) Tunneling: No Undermining: No Wound Description Classification: Grade 1 Wound Margin: Flat and Intact Exudate Amount: Medium Exudate Type: Serous Exudate Color: amber Foul Odor After Cleansing: No Slough/Fibrino No Wound Bed Granulation Amount: Large (67-100%) Exposed Structure Granulation Quality: Red Fascia Exposed: No Necrotic Amount: None Present (0%) Fat Layer (  Subcutaneous Tissue) Exposed: Yes Tendon Exposed: No Muscle Exposed: No Joint Exposed: No Bone Exposed: No Treatment Notes Wound #4 (Lower Leg) Wound Laterality: Left, Anterior Cleanser Peri-Wound Care Triamcinolone 15 (g) Discharge Instruction: Use triamcinolone 15 (g) mixed with lotion to lower legs Sween Lotion (Moisturizing lotion) Discharge Instruction: Apply moisturizing lotion as directed Topical Primary Dressing KerraCel Ag Gelling Fiber Dressing, 2x2 in (silver alginate) Discharge Instruction: Apply silver alginate to wound bed as instructed Secondary Dressing Woven Gauze Sponge, Non-Sterile 4x4 in Discharge Instruction: Apply over primary dressing as directed. Secured With Compression Wrap Kerlix Roll 4.5x3.1 (in/yd) Discharge Instruction: Apply Kerlix and Coban compression as directed. Coban Self-Adherent Wrap 4x5 (in/yd) Discharge Instruction: Apply over Kerlix as directed. Compression Stockings Add-Ons Electronic Signature(s) Signed: 08/04/2021 4:36:51 PM By: Deon Pilling RN, BSN Signed: 08/04/2021 4:49:37 PM By: Baruch Gouty RN, BSN Entered By: Deon Pilling on 08/04/2021 14:21:22 -------------------------------------------------------------------------------- Wound Assessment Details Patient Name: Date of Service: [name], [name] 08/04/2021 1:45 PM Medical Record Number: [id number] Patient Account Number: [id number] Date of Birth/Sex: Treating RN: 06/08/1936 (85 y.o. [name] Primary Care Warnie Belair: [name] Other Clinician: Referring  Waniya Hoglund: Treating Kayde Atkerson/Extender: Guadalupe Maple, Karrar Weeks in Treatment: 2 Wound Status Wound Number: 6 Primary Diabetic Wound/Ulcer of the Lower Extremity Etiology: Wound Location: Left, Distal, Anterior Lower Leg Secondary Lymphedema Wounding Event: Blister Etiology: Date Acquired: 07/21/2021 Wound Open Weeks Of Treatment: 2 Status: Clustered Wound: No Clustered Wound: No Comorbid Cataracts, Glaucoma, Chronic Obstructive Pulmonary Disease History: (COPD), Arrhythmia, Congestive Heart Failure, Coronary Artery Disease, Hypertension, Type II Diabetes, Gout, Neuropathy Photos Wound Measurements Length: (cm) 1.3 Width: (cm) 1 Depth: (cm) 0.1 Area: (cm) 1.021 Volume: (cm) 0.102 % Reduction in Area: 69.4% % Reduction in Volume: 69.5% Epithelialization: Medium (34-66%) Tunneling: No Undermining: No Wound Description Classification: Grade 2 Wound Margin: Distinct, outline attached Exudate Amount: Medium Exudate Type: Serosanguineous Exudate Color: red, brown Foul Odor After Cleansing: No Slough/Fibrino Yes Wound Bed Granulation Amount: Large (67-100%) Exposed Structure Granulation Quality: Red Fascia Exposed: No Necrotic Amount: None Present (0%) Fat Layer (Subcutaneous Tissue) Exposed: Yes Tendon Exposed: No Muscle Exposed: No Joint Exposed: No Bone Exposed: No Treatment Notes Wound #6 (Lower Leg) Wound Laterality: Left, Anterior, Distal Cleanser Peri-Wound Care Triamcinolone 15 (g) Discharge Instruction: Use triamcinolone 15 (g) mixed with lotion to lower legs Sween Lotion (Moisturizing lotion) Discharge Instruction: Apply moisturizing lotion as directed Topical Primary Dressing KerraCel Ag Gelling Fiber Dressing, 2x2 in (silver alginate) Discharge Instruction: Apply silver alginate to wound bed as instructed Secondary Dressing Woven Gauze Sponge, Non-Sterile 4x4 in Discharge Instruction: Apply over primary dressing as directed. Secured  With Compression Wrap Kerlix Roll 4.5x3.1 (in/yd) Discharge Instruction: Apply Kerlix and Coban compression as directed. Coban Self-Adherent Wrap 4x5 (in/yd) Discharge Instruction: Apply over Kerlix as directed. Compression Stockings Add-Ons Electronic Signature(s) Signed: 08/04/2021 4:36:51 PM By: Deon Pilling RN, BSN Signed: 08/04/2021 4:49:37 PM By: Baruch Gouty RN, BSN Entered By: Deon Pilling on 08/04/2021 14:21:53 -------------------------------------------------------------------------------- Wound Assessment Details Patient Name: Date of Service: [name], [name] 08/04/2021 1:45 PM Medical Record Number: [id number] Patient Account Number: [id number] Date of Birth/Sex: Treating RN: 1935/10/16 (85 y.o. [name] Primary Care Zell Hylton: [name] Other Clinician: Referring Izsak Meir: Treating Marlaya Turck/Extender: Guadalupe Maple, Karrar Weeks in Treatment: 2 Wound Status Wound Number: 7 Primary Diabetic Wound/Ulcer of the Lower Extremity Etiology: Wound Location: Right, Anterior Lower Leg Secondary Lymphedema Wounding Event: Blister Etiology: Date Acquired: 07/21/2021 Wound Healed - Epithelialized  Weeks Of Treatment: 2 Status: Clustered Wound: No Comorbid Cataracts, Glaucoma, Chronic Obstructive Pulmonary Disease History: (COPD), Arrhythmia, Congestive Heart Failure, Coronary Artery Disease, Hypertension, Type II Diabetes, Gout, Neuropathy Photos Wound Measurements Length: (cm) Width: (cm) Depth: (cm) Area: (cm) Volume: (cm) 0 % Reduction in Area: 100% 0 % Reduction in Volume: 100% 0 Epithelialization: Large (67-100%) 0 Tunneling: No 0 Undermining: No Wound Description Classification: Grade 2 Wound Margin: Distinct, outline attached Exudate Amount: Medium Exudate Type: Serosanguineous Exudate Color: red, brown Foul Odor After Cleansing: No Slough/Fibrino No Wound Bed Granulation Amount: None Present (0%) Exposed  Structure Necrotic Amount: Large (67-100%) Fascia Exposed: No Necrotic Quality: Eschar Fat Layer (Subcutaneous Tissue) Exposed: Yes Tendon Exposed: No Muscle Exposed: No Joint Exposed: No Bone Exposed: No Treatment Notes Wound #7 (Lower Leg) Wound Laterality: Right, Anterior Cleanser Peri-Wound Care Topical Primary Dressing Secondary Dressing Secured With Compression Wrap Compression Stockings Add-Ons Electronic Signature(s) Signed: 08/04/2021 4:36:51 PM By: Deon Pilling RN, BSN Entered By: Deon Pilling on 08/04/2021 14:30:51 -------------------------------------------------------------------------------- Wound Assessment Details Patient Name: Date of Service: [name], Shaune Pascal ID M. 08/04/2021 1:45 PM Medical Record Number: [id number] Patient Account Number: [id number] Date of Birth/Sex: Treating RN: October 28, 1935 (84 y.o. [name] Primary Care Glendine Swetz: [name] Other Clinician: Referring Kross Swallows: Treating Mliss Wedin/Extender: Guadalupe Maple, Karrar Weeks in Treatment: 2 Wound Status Wound Number: 9 Primary Diabetic Wound/Ulcer of the Lower Extremity Etiology: Wound Location: Left, Dorsal Foot Secondary Lymphedema Wounding Event: Gradually Appeared Etiology: Date Acquired: 08/04/2021 Wound Open Weeks Of Treatment: 0 Status: Clustered Wound: No Comorbid Cataracts, Glaucoma, Chronic Obstructive Pulmonary Disease History: (COPD), Arrhythmia, Congestive Heart Failure, Coronary Artery Disease, Hypertension, Type II Diabetes, Gout, Neuropathy Photos Wound Measurements Length: (cm) 0.4 Width: (cm) 0.3 Depth: (cm) 0.1 Area: (cm) 0.094 Volume: (cm) 0.009 % Reduction in Area: 0% % Reduction in Volume: 0% Epithelialization: None Tunneling: No Undermining: No Wound Description Classification: Unable to visualize wound bed Wound Margin: Distinct, outline attached Exudate Amount: Medium Exudate Type: Serosanguineous Exudate Color: red,  brown Wound Bed Granulation Amount: None Present (0%) Necrotic Amount: Large (67-100%) Necrotic Quality: Eschar, Adherent Slough Foul Odor After Cleansing: No Slough/Fibrino Yes Exposed Structure Fascia Exposed: No Fat Layer (Subcutaneous Tissue) Exposed: Yes Tendon Exposed: No Muscle Exposed: No Joint Exposed: No Bone Exposed: No Treatment Notes Wound #9 (Foot) Wound Laterality: Dorsal, Left Cleanser Peri-Wound Care Triamcinolone 15 (g) Discharge Instruction: Use triamcinolone 15 (g) mixed with lotion to lower legs Sween Lotion (Moisturizing lotion) Discharge Instruction: Apply moisturizing lotion as directed Topical Primary Dressing KerraCel Ag Gelling Fiber Dressing, 2x2 in (silver alginate) Discharge Instruction: Apply silver alginate to wound bed as instructed Secondary Dressing Woven Gauze Sponge, Non-Sterile 4x4 in Discharge Instruction: Apply over primary dressing as directed. Secured With Compression Wrap Kerlix Roll 4.5x3.1 (in/yd) Discharge Instruction: Apply Kerlix and Coban compression as directed. Coban Self-Adherent Wrap 4x5 (in/yd) Discharge Instruction: Apply over Kerlix as directed. Compression Stockings Add-Ons Electronic Signature(s) Signed: 08/04/2021 4:36:51 PM By: Deon Pilling RN, BSN Signed: 08/04/2021 4:49:37 PM By: Baruch Gouty RN, BSN Entered By: Deon Pilling on 08/04/2021 14:22:52 -------------------------------------------------------------------------------- Vitals Details Patient Name: Date of Service: [name], [name] 08/04/2021 1:45 PM Medical Record Number: [id number] Patient Account Number: [id number] Date of Birth/Sex: Treating RN: 1936/05/14 (85 y.o. [name] Primary Care Maryclare Nydam: [name] Other Clinician: Referring Fortunata Betty: Treating Annabeth Tortora/Extender: Guadalupe Maple, Karrar Weeks in Treatment: 2 Vital Signs Time Taken: 13:50 Temperature (F): 97.5 Height (in): 69 Pulse (bpm): 94 Weight  (  lbs): 185 Respiratory Rate (breaths/min): 18 Body Mass Index (BMI): 27.3 Blood Pressure (mmHg): 109/76 Capillary Blood Glucose (mg/dl): 120 Reference Range: 80 - 120 mg / dl Electronic Signature(s) Signed: 08/09/2021 2:35:48 PM By: Sandre Kitty Entered By: Sandre Kitty on 08/04/2021 13:50:30

## 2021-08-11 ENCOUNTER — Encounter (HOSPITAL_BASED_OUTPATIENT_CLINIC_OR_DEPARTMENT_OTHER): Payer: Medicare Other | Admitting: Physician Assistant

## 2021-08-11 ENCOUNTER — Other Ambulatory Visit: Payer: Self-pay

## 2021-08-11 DIAGNOSIS — I87333 Chronic venous hypertension (idiopathic) with ulcer and inflammation of bilateral lower extremity: Secondary | ICD-10-CM | POA: Diagnosis not present

## 2021-08-11 DIAGNOSIS — L97522 Non-pressure chronic ulcer of other part of left foot with fat layer exposed: Secondary | ICD-10-CM | POA: Diagnosis not present

## 2021-08-11 DIAGNOSIS — L97818 Non-pressure chronic ulcer of other part of right lower leg with other specified severity: Secondary | ICD-10-CM | POA: Diagnosis not present

## 2021-08-11 DIAGNOSIS — L97812 Non-pressure chronic ulcer of other part of right lower leg with fat layer exposed: Secondary | ICD-10-CM | POA: Diagnosis not present

## 2021-08-11 DIAGNOSIS — L97828 Non-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 leg with other specified severity: Secondary | ICD-10-CM | POA: Diagnosis not present

## 2021-08-11 DIAGNOSIS — E1151 Type 2 diabetes mellitus with diabetic peripheral angiopathy without gangrene: Secondary | ICD-10-CM | POA: Diagnosis not present

## 2021-08-11 NOTE — Progress Notes (Signed)
Kadrmas, Fredis M. ([id number]) Visit Report for 08/11/2021 Arrival Information Details Patient Name: Date of Service: [name] ID M. 08/11/2021 1:45 PM Medical Record Number: [id number] Patient Account Number: [id number] Date of Birth/Sex: Treating RN: 04-13-1936 (85 y.o. [name] Primary Care Kemauri Musa: [name] Other Clinician: Referring Ainslie Mazurek: Treating Karyl Sharrar/Extender: Guadalupe Maple, Karrar Weeks in Treatment: 3 Visit Information History Since Last Visit Added or deleted any medications: No Patient Arrived: Ambulatory Any new allergies or adverse reactions: No Arrival Time: 13:54 Had a fall or experienced change in No Accompanied By: self activities of daily living that may affect Transfer Assistance: Manual risk of falls: Patient Identification Verified: Yes Signs or symptoms of abuse/neglect since last visito No Secondary Verification Process Completed: Yes Hospitalized since last visit: No Patient Requires Transmission-Based Precautions: No Implantable device outside of the clinic excluding No Patient Has Alerts: Yes cellular tissue based products placed in the center Patient Alerts: Patient on Blood Thinner since last visit: R ABI = 1.43 TBI= .39 Has Dressing in Place as Prescribed: Yes L ABI = 1.85 TBI= .6 Pain Present Now: No Electronic Signature(s) Signed: 08/11/2021 3:58:17 PM By: Sandre Kitty Entered By: Sandre Kitty on 08/11/2021 13:55:01 -------------------------------------------------------------------------------- Clinic Level of Care Assessment Details Patient Name: Date of Service: [name] ID M. 08/11/2021 1:45 PM Medical Record Number: [id number] Patient Account Number: [id number] Date of Birth/Sex: Treating RN: Mar 04, 1936 (85 y.o. [name] Primary Care Terrence Pizana: [name] Other Clinician: Referring Brietta Manso: Treating Tyus Kallam/Extender: Guadalupe Maple, Karrar Weeks in Treatment:  3 Clinic Level of Care Assessment Items TOOL 4 Quantity Score X- 1 0 Use when only an EandM is performed on FOLLOW-UP visit ASSESSMENTS - Nursing Assessment / Reassessment X- 1 10 Reassessment of Co-morbidities (includes updates in patient status) X- 1 5 Reassessment of Adherence to Treatment Plan ASSESSMENTS - Wound and Skin A ssessment / Reassessment '[]'  - 0 Simple Wound Assessment / Reassessment - one wound X- 2 5 Complex Wound Assessment / Reassessment - multiple wounds '[]'  - 0 Dermatologic / Skin Assessment (not related to wound area) ASSESSMENTS - Focused Assessment '[]'  - 0 Circumferential Edema Measurements - multi extremities '[]'  - 0 Nutritional Assessment / Counseling / Intervention '[]'  - 0 Lower Extremity Assessment (monofilament, tuning fork, pulses) '[]'  - 0 Peripheral Arterial Disease Assessment (using hand held doppler) ASSESSMENTS - Ostomy and/or Continence Assessment and Care '[]'  - 0 Incontinence Assessment and Management '[]'  - 0 Ostomy Care Assessment and Management (repouching, etc.) PROCESS - Coordination of Care '[]'  - 0 Simple Patient / Family Education for ongoing care X- 1 20 Complex (extensive) Patient / Family Education for ongoing care '[]'  - 0 Staff obtains Programmer, systems, Records, T Results / Process Orders est '[]'  - 0 Staff telephones HHA, Nursing Homes / Clarify orders / etc '[]'  - 0 Routine Transfer to another Facility (non-emergent condition) '[]'  - 0 Routine Hospital Admission (non-emergent condition) '[]'  - 0 New Admissions / Biomedical engineer / Ordering NPWT Apligraf, etc. , '[]'  - 0 Emergency Hospital Admission (emergent condition) '[]'  - 0 Simple Discharge Coordination '[]'  - 0 Complex (extensive) Discharge Coordination PROCESS - Special Needs '[]'  - 0 Pediatric / Minor Patient Management '[]'  - 0 Isolation Patient Management '[]'  - 0 Hearing / Language / Visual special needs '[]'  - 0 Assessment of Community assistance (transportation, D/C planning,  etc.) '[]'  - 0 Additional assistance / Altered mentation '[]'  - 0 Support Surface(s) Assessment (bed, cushion, seat, etc.) INTERVENTIONS - Wound Cleansing /  Measurement '[]'  - 0 Simple Wound Cleansing - one wound X- 2 5 Complex Wound Cleansing - multiple wounds X- 1 5 Wound Imaging (photographs - any number of wounds) '[]'  - 0 Wound Tracing (instead of photographs) '[]'  - 0 Simple Wound Measurement - one wound X- 2 5 Complex Wound Measurement - multiple wounds INTERVENTIONS - Wound Dressings '[]'  - 0 Small Wound Dressing one or multiple wounds '[]'  - 0 Medium Wound Dressing one or multiple wounds X- 2 20 Large Wound Dressing one or multiple wounds '[]'  - 0 Application of Medications - topical '[]'  - 0 Application of Medications - injection INTERVENTIONS - Miscellaneous '[]'  - 0 External ear exam '[]'  - 0 Specimen Collection (cultures, biopsies, blood, body fluids, etc.) '[]'  - 0 Specimen(s) / Culture(s) sent or taken to Lab for analysis '[]'  - 0 Patient Transfer (multiple staff / Civil Service fast streamer / Similar devices) '[]'  - 0 Simple Staple / Suture removal (25 or less) '[]'  - 0 Complex Staple / Suture removal (26 or more) '[]'  - 0 Hypo / Hyperglycemic Management (close monitor of Blood Glucose) '[]'  - 0 Ankle / Brachial Index (ABI) - do not check if billed separately X- 1 5 Vital Signs Has the patient been seen at the hospital within the last three years: Yes Total Score: 115 Level Of Care: New/Established - Level 3 Electronic Signature(s) Signed: 08/11/2021 5:19:40 PM By: Lorrin Jackson Entered By: Lorrin Jackson on 08/11/2021 14:28:08 -------------------------------------------------------------------------------- Encounter Discharge Information Details Patient Name: Date of Service: [name], [name] 08/11/2021 1:45 PM Medical Record Number: [id number] Patient Account Number: [id number] Date of Birth/Sex: Treating RN: 01-15-1936 (85 y.o. [name] Primary Care Javarie Crisp: [name]  Other Clinician: Referring Nalda Shackleford: Treating Cherryl Babin/Extender: Guadalupe Maple, Karrar Weeks in Treatment: 3 Encounter Discharge Information Items Discharge Condition: Stable Ambulatory Status: Ambulatory Discharge Destination: Home Transportation: Private Auto Schedule Follow-up Appointment: Yes Clinical Summary of Care: Provided on 08/11/2021 Form Type Recipient Paper Patient Patient Electronic Signature(s) Signed: 08/11/2021 5:19:40 PM By: Lorrin Jackson Entered By: Lorrin Jackson on 08/11/2021 14:47:23 -------------------------------------------------------------------------------- Lower Extremity Assessment Details Patient Name: Date of Service: [name], Shaune Pascal ID M. 08/11/2021 1:45 PM Medical Record Number: [id number] Patient Account Number: [id number] Date of Birth/Sex: Treating RN: December 25, 1935 (85 y.o. [name] Primary Care Lyndsi Altic: [name] Other Clinician: Referring Ardell Aaronson: Treating Dahmir Epperly/Extender: Guadalupe Maple, Karrar Weeks in Treatment: 3 Edema Assessment Assessed: Shirlyn Goltz: Yes] Patrice Paradise: Yes] Edema: [Left: Yes] [Right: Yes] Calf Left: Right: Point of Measurement: From Medial Instep 35 cm 35.5 cm Ankle Left: Right: Point of Measurement: From Medial Instep 22.5 cm 21 cm Vascular Assessment Pulses: Dorsalis Pedis Palpable: [Left:Yes] [Right:Yes] Electronic Signature(s) Signed: 08/11/2021 5:19:40 PM By: Lorrin Jackson Entered By: Lorrin Jackson on 08/11/2021 14:16:20 -------------------------------------------------------------------------------- Multi-Disciplinary Care Plan Details Patient Name: Date of Service: [name], [name] 08/11/2021 1:45 PM Medical Record Number: [id number] Patient Account Number: [id number] Date of Birth/Sex: Treating RN: 12/20/1935 (85 y.o. [name] Primary Care Tahjai Schetter: [name] Other Clinician: Referring Carlous Olivares: Treating Nasean Zapf/Extender: Veatrice Bourbon in Treatment: 3 Multidisciplinary Care Plan reviewed with physician Active Inactive Nutrition Nursing Diagnoses: Impaired glucose control: actual or potential Potential for alteratiion in Nutrition/Potential for imbalanced nutrition Goals: Patient/caregiver will maintain therapeutic glucose control Date Initiated: 07/15/2021 Target Resolution Date: 09/15/2021 Goal Status: Active Interventions: Assess HgA1c results as ordered upon admission and as needed Assess patient nutrition upon admission and as needed per policy Provide education on elevated  blood sugars and impact on wound healing Treatment Activities: Dietary management education, guidance and counseling : 07/15/2021 Giving encouragement to exercise : 07/15/2021 Patient referred to Primary Care Physician for further nutritional evaluation : 07/15/2021 Notes: 08/11/21: Glucose control ongoing Venous Leg Ulcer Nursing Diagnoses: Knowledge deficit related to disease process and management Potential for venous Insuffiency (use before diagnosis confirmed) Goals: Patient will maintain optimal edema control Date Initiated: 07/15/2021 Target Resolution Date: 09/15/2021 Goal Status: Active Interventions: Assess peripheral edema status every visit. Compression as ordered Provide education on venous insufficiency Treatment Activities: Therapeutic compression applied : 07/15/2021 Notes: 08/11/21: Edema control ongoing Wound/Skin Impairment Nursing Diagnoses: Impaired tissue integrity Knowledge deficit related to ulceration/compromised skin integrity Goals: Patient/caregiver will verbalize understanding of skin care regimen Date Initiated: 07/15/2021 Target Resolution Date: 09/15/2021 Goal Status: Active Ulcer/skin breakdown will have a volume reduction of 30% by week 4 Date Initiated: 07/15/2021 Date Inactivated: 08/11/2021 Target Resolution Date: 08/12/2021 Goal Status: Met Ulcer/skin breakdown will have a  volume reduction of 50% by week 8 Date Initiated: 08/11/2021 Target Resolution Date: 09/15/2021 Goal Status: Active Interventions: Assess patient/caregiver ability to obtain necessary supplies Assess patient/caregiver ability to perform ulcer/skin care regimen upon admission and as needed Assess ulceration(s) every visit Provide education on ulcer and skin care Treatment Activities: Skin care regimen initiated : 07/15/2021 Topical wound management initiated : 07/15/2021 Notes: Electronic Signature(s) Signed: 08/11/2021 2:22:13 PM By: Lorrin Jackson Entered By: Lorrin Jackson on 08/11/2021 14:22:12 -------------------------------------------------------------------------------- Pain Assessment Details Patient Name: Date of Service: [name], Shaune Pascal ID M. 08/11/2021 1:45 PM Medical Record Number: [id number] Patient Account Number: [id number] Date of Birth/Sex: Treating RN: 02-22-1936 (85 y.o. [name] Primary Care Arraya Buck: [name] Other Clinician: Referring Vendetta Pittinger: Treating Faiz Weber/Extender: Guadalupe Maple, Karrar Weeks in Treatment: 3 Active Problems Location of Pain Severity and Description of Pain Patient Has Paino No Site Locations Pain Management and Medication Current Pain Management: Electronic Signature(s) Signed: 08/11/2021 3:58:17 PM By: Sandre Kitty Signed: 08/11/2021 4:24:09 PM By: Baruch Gouty RN, BSN Entered By: Sandre Kitty on 08/11/2021 13:55:29 -------------------------------------------------------------------------------- Patient/Caregiver Education Details Patient Name: Date of Service: DA Burgess Estelle 12/14/2022andnbsp1:45 PM Medical Record Number: [id number] Patient Account Number: [id number] Date of Birth/Gender: Treating RN: 09-Sep-1935 (85 y.o. [name] Primary Care Physician: [name] Other Clinician: Referring Physician: Treating Physician/Extender: Guadalupe Maple, Fransisca Connors in  Treatment: 3 Education Assessment Education Provided To: Patient Education Topics Provided Elevated Blood Sugar/ Impact on Healing: Methods: Explain/Verbal, Printed Responses: State content correctly Venous: Methods: Explain/Verbal, Printed Responses: State content correctly Wound/Skin Impairment: Methods: Explain/Verbal, Printed Responses: State content correctly Electronic Signature(s) Signed: 08/11/2021 5:19:40 PM By: Lorrin Jackson Entered By: Lorrin Jackson on 08/11/2021 14:22:35 -------------------------------------------------------------------------------- Wound Assessment Details Patient Name: Date of Service: [name], Shaune Pascal ID M. 08/11/2021 1:45 PM Medical Record Number: [id number] Patient Account Number: [id number] Date of Birth/Sex: Treating RN: [birth year] (85 y.o. [name] Primary Care Kodee Ravert: [name] Other Clinician: Referring Alexyss Balzarini: Treating Lyssa Hackley/Extender: Guadalupe Maple, Karrar Weeks in Treatment: 3 Wound Status Wound Number: 1 Primary Diabetic Wound/Ulcer of the Lower Extremity Etiology: Wound Location: Right, Distal, Anterior Lower Leg Secondary Venous Leg Ulcer Wounding Event: Gradually Appeared Etiology: Date Acquired: 04/29/2021 Wound Open Weeks Of Treatment: 3 Status: Status: Clustered Wound: No Comorbid Cataracts, Glaucoma, Chronic Obstructive Pulmonary Disease History: (COPD), Arrhythmia, Congestive Heart Failure, Coronary Artery Disease, Hypertension, Type II Diabetes, Gout, Neuropathy Wound Measurements Length: (cm) 1 Width: (cm) 0.7 Depth: (cm) 0.1 Area: (  cm) 0.55 Volume: (cm) 0.055 % Reduction in Area: 91.7% % Reduction in Volume: 91.7% Epithelialization: Large (67-100%) Tunneling: No Undermining: No Wound Description Classification: Grade 1 Wound Margin: Distinct, outline attached Exudate Amount: Medium Exudate Type: Serosanguineous Exudate Color: red, brown Foul Odor After Cleansing:  No Slough/Fibrino Yes Wound Bed Granulation Amount: Large (67-100%) Exposed Structure Granulation Quality: Red Fascia Exposed: No Necrotic Amount: Small (1-33%) Fat Layer (Subcutaneous Tissue) Exposed: Yes Necrotic Quality: Adherent Slough Tendon Exposed: No Muscle Exposed: No Joint Exposed: No Bone Exposed: No Treatment Notes Wound #1 (Lower Leg) Wound Laterality: Right, Anterior, Distal Cleanser Peri-Wound Care Triamcinolone 15 (g) Discharge Instruction: Use triamcinolone 15 (g) mixed with lotion to lower legs Sween Lotion (Moisturizing lotion) Discharge Instruction: Apply moisturizing lotion as directed Topical Primary Dressing KerraCel Ag Gelling Fiber Dressing, 2x2 in (silver alginate) Discharge Instruction: Apply silver alginate to wound bed as instructed Secondary Dressing Woven Gauze Sponge, Non-Sterile 4x4 in Discharge Instruction: Apply over primary dressing as directed. Secured With Compression Wrap Kerlix Roll 4.5x3.1 (in/yd) Discharge Instruction: Apply Kerlix and Coban compression as directed. Coban Self-Adherent Wrap 4x5 (in/yd) Discharge Instruction: Apply over Kerlix as directed. Compression Stockings Add-Ons Electronic Signature(s) Signed: 08/11/2021 4:24:09 PM By: Baruch Gouty RN, BSN Signed: 08/11/2021 5:19:40 PM By: Lorrin Jackson Entered By: Lorrin Jackson on 08/11/2021 14:17:38 -------------------------------------------------------------------------------- Wound Assessment Details Patient Name: Date of Service: [name], [name] 08/11/2021 1:45 PM Medical Record Number: [id number] Patient Account Number: [id number] Date of Birth/Sex: Treating RN: 08/21/1936 (85 y.o. [name] Primary Care Alise Calais: [name] Other Clinician: Referring Danalee Flath: Treating Quisha Mabie/Extender: Guadalupe Maple, Karrar Weeks in Treatment: 3 Wound Status Wound Number: 4 Primary Diabetic Wound/Ulcer of the Lower Extremity Etiology: Wound  Location: Left, Anterior Lower Leg Secondary Venous Leg Ulcer Wounding Event: Blister Etiology: Date Acquired: 04/29/2021 Wound Healed - Epithelialized Weeks Of Treatment: 3 Status: Clustered Wound: No Comorbid Cataracts, Glaucoma, Chronic Obstructive Pulmonary Disease History: (COPD), Arrhythmia, Congestive Heart Failure, Coronary Artery Disease, Hypertension, Type II Diabetes, Gout, Neuropathy Wound Measurements Length: (cm) Width: (cm) Depth: (cm) Area: (cm) Volume: (cm) 0 % Reduction in Area: 100% 0 % Reduction in Volume: 100% 0 0 0 Wound Description Classification: Grade 1 Electronic Signature(s) Signed: 08/11/2021 4:24:09 PM By: Baruch Gouty RN, BSN Signed: 08/11/2021 5:19:40 PM By: Lorrin Jackson Entered By: Lorrin Jackson on 08/11/2021 14:16:48 -------------------------------------------------------------------------------- Wound Assessment Details Patient Name: Date of Service: [name], [name] 08/11/2021 1:45 PM Medical Record Number: [id number] Patient Account Number: [id number] Date of Birth/Sex: Treating RN: [birth year] (85 y.o. [name] Primary Care Theresea Trautmann: [name] Other Clinician: Referring Waunita Sandstrom: Treating Jerlene Rockers/Extender: Guadalupe Maple, Karrar Weeks in Treatment: 3 Wound Status Wound Number: 6 Primary Diabetic Wound/Ulcer of the Lower Extremity Etiology: Wound Location: Left, Distal, Anterior Lower Leg Secondary Lymphedema Wounding Event: Blister Etiology: Date Acquired: 07/21/2021 Wound Healed - Epithelialized Weeks Of Treatment: 3 Status: Clustered Wound: No Comorbid Cataracts, Glaucoma, Chronic Obstructive Pulmonary Disease History: (COPD), Arrhythmia, Congestive Heart Failure, Coronary Artery Disease, Hypertension, Type II Diabetes, Gout, Neuropathy Wound Measurements Length: (cm) Width: (cm) Depth: (cm) Area: (cm) Volume: (cm) 0 % Reduction in Area: 100% 0 % Reduction in Volume:  100% 0 0 0 Wound Description Classification: Grade 2 Electronic Signature(s) Signed: 08/11/2021 4:24:09 PM By: Baruch Gouty RN, BSN Signed: 08/11/2021 5:19:40 PM By: Lorrin Jackson Entered By: Joya San on 08/11/2021 14:17:09 -------------------------------------------------------------------------------- Wound Assessment Details Patient Name: Date of Service: [name], [name] 08/11/2021 1:45  PM Medical Record Number: [id number] Patient Account Number: [id number] Date of Birth/Sex: Treating RN: 1936/08/21 (85 y.o. [name] Primary Care Rayn Enderson: [name] Other Clinician: Referring Leesha Veno: Treating Jasmine Maceachern/Extender: Guadalupe Maple, Karrar Weeks in Treatment: 3 Wound Status Wound Number: 9 Primary Diabetic Wound/Ulcer of the Lower Extremity Etiology: Wound Location: Left, Dorsal Foot Secondary Lymphedema Wounding Event: Gradually Appeared Etiology: Date Acquired: 08/04/2021 Wound Open Weeks Of Treatment: 1 Status: Clustered Wound: No Comorbid Cataracts, Glaucoma, Chronic Obstructive Pulmonary Disease History: (COPD), Arrhythmia, Congestive Heart Failure, Coronary Artery Disease, Hypertension, Type II Diabetes, Gout, Neuropathy Wound Measurements Length: (cm) 0.4 Width: (cm) 0.3 Depth: (cm) 0.1 Area: (cm) 0.094 Volume: (cm) 0.009 % Reduction in Area: 0% % Reduction in Volume: 0% Epithelialization: None Tunneling: No Undermining: No Wound Description Classification: Unable to visualize wound bed Wound Margin: Distinct, outline attached Exudate Amount: Medium Exudate Type: Serosanguineous Exudate Color: red, brown Foul Odor After Cleansing: No Slough/Fibrino Yes Wound Bed Granulation Amount: Small (1-33%) Exposed Structure Necrotic Amount: Large (67-100%) Fascia Exposed: No Necrotic Quality: Adherent Slough Fat Layer (Subcutaneous Tissue) Exposed: Yes Tendon Exposed: No Muscle Exposed: No Joint Exposed: No Bone  Exposed: No Treatment Notes Wound #9 (Foot) Wound Laterality: Dorsal, Left Cleanser Peri-Wound Care Triamcinolone 15 (g) Discharge Instruction: Use triamcinolone 15 (g) mixed with lotion to lower legs Sween Lotion (Moisturizing lotion) Discharge Instruction: Apply moisturizing lotion as directed Topical Primary Dressing KerraCel Ag Gelling Fiber Dressing, 2x2 in (silver alginate) Discharge Instruction: Apply silver alginate to wound bed as instructed Secondary Dressing Woven Gauze Sponge, Non-Sterile 4x4 in Discharge Instruction: Apply over primary dressing as directed. Secured With Compression Wrap Kerlix Roll 4.5x3.1 (in/yd) Discharge Instruction: Apply Kerlix and Coban compression as directed. Coban Self-Adherent Wrap 4x5 (in/yd) Discharge Instruction: Apply over Kerlix as directed. Compression Stockings Add-Ons Electronic Signature(s) Signed: 08/11/2021 4:24:09 PM By: Baruch Gouty RN, BSN Signed: 08/11/2021 5:19:40 PM By: Lorrin Jackson Entered By: Lorrin Jackson on 08/11/2021 14:18:05 -------------------------------------------------------------------------------- Vitals Details Patient Name: Date of Service: [name], [name] 08/11/2021 1:45 PM Medical Record Number: [id number] Patient Account Number: [id number] Date of Birth/Sex: Treating RN: Sep 18, 1935 (85 y.o. [name] Primary Care Rene Gonsoulin: [name] Other Clinician: Referring Johnross Nabozny: Treating Amalee Olsen/Extender: Guadalupe Maple, Karrar Weeks in Treatment: 3 Vital Signs Time Taken: 13:55 Temperature (F): 97.5 Height (in): 69 Pulse (bpm): 65 Weight (lbs): 185 Respiratory Rate (breaths/min): 18 Body Mass Index (BMI): 27.3 Blood Pressure (mmHg): 119/71 Capillary Blood Glucose (mg/dl): 130 Reference Range: 80 - 120 mg / dl Electronic Signature(s) Signed: 08/11/2021 3:58:17 PM By: Sandre Kitty Entered By: Sandre Kitty on 08/11/2021 13:55:23

## 2021-08-11 NOTE — Progress Notes (Addendum)
Vincent [name], Vincent M. ([id number]) Visit Report for 08/11/2021 Chief Complaint Document Details Patient Name: Date of Service: Vincent Vincent [name] ID M. 08/11/2021 1:45 PM Medical Record Number: [id number] Patient Account Number: [id number] Date of Birth/Sex: Treating RN: Aug 16, 1936 (85 y.o. Vincent [name] Primary Care Provider: Wenda [name] Other Clinician: Referring Provider: Treating Provider/Extender: Guadalupe Maple, Karrar Weeks in Treatment: 3 Information Obtained from: Patient Chief Complaint 07/15/2021 patient is here for wounds on his bilateral lower legs Electronic Signature(s) Signed: 08/11/2021 2:11:18 PM By: Worthy Keeler PA-C Entered By: Worthy Keeler on 08/11/2021 14:11:17 -------------------------------------------------------------------------------- HPI Details Patient Name: Date of Service: Vincent [name], Vincent [name] ID M. 08/11/2021 1:45 PM Medical Record Number: [id number] Patient Account Number: [id number] Date of Birth/Sex: Treating RN: 04-19-1936 (85 y.o. Vincent [name] Primary Care Provider: Wenda [name] Other Clinician: Referring Provider: Treating Provider/Extender: Guadalupe Maple, Karrar Weeks in Treatment: 3 History of Present Illness HPI Description: ADMISSION 07/15/2021 This is an 85 year old man who came here for evaluation of wounds on his bilateral predominantly anterior lower legs mid aspect of the tibia. He seems to have been dealing with this for most of this year. Currently with 3 wounds on the right and 2 on the left. At least some of these seem to start off as blisters probably the majority. He has been following with Dr. Anabel Bene of [facility] dermatology according the patient back he has an appointment Dr. Anabel Bene tomorrow he was given a prescription for triamcinolone which she has been applying daily in fact it was renewed yesterday. Patient is a diabetic no recent hemoglobin A1c. He is on Coumadin for persistent atrial  fibrillation. Vein and vascular in December 2021 at which time he had bilateral foot pain and finger pain. ABIs bilaterally were noncompressible however the TBI on the right was only 0.39. There were biphasic waveforms on the left his ABI was noncompressible with biphasic waveforms but with a TBI of 0.6. He was not felt to have a primary vascular issue. There is a [name] of Raynaud's phenomenon as well. Past medical history includes cellulitis of the right leg in June 2022, type 2 diabetes, persistent lower extremity edema, bilateral foot and ankle pain, Raynaud's phenomenon, coronary artery disease, congestive heart failure, pacemaker, none sustained ventricular tachycardia, remote CVA paroxysmal atrial fib on Coumadin. We did not reattempt ABIs in our clinic 07/22/2019 patient appears to be doing decently well in regard to his wounds as compared to last week with the wounds that were present last week. Fortunately I do not see any signs of active infection which is great news. No fevers, chills, nausea, vomiting, or diarrhea. With that being said he has several new blisters that are getting need to be addressed today. 07/28/2021 upon evaluation today patient actually appears to be making excellent progress. I am extremely pleased with where we stand today. I think that his wounds are significantly improved compared to where they were previous. 08/04/2021 upon evaluation today patient appears to be doing much better in regard to his wounds in general. I feel like across the board he is showing signs of significant improvement. Fortunately there is no evidence of infection which is great news. No fevers, chills, nausea, vomiting, or diarrhea. 08/11/2021 upon evaluation today patient actually appears to be doing quite well in regard to his wound. Has been tolerating the dressing changes without complication. Fortunately I do not see any signs of infection currently which is great news as well. No  fevers, chills, nausea,  vomiting, or diarrhea. Electronic Signature(s) Signed: 08/11/2021 2:27:54 PM By: Worthy Keeler PA-C Entered By: Worthy Keeler on 08/11/2021 14:27:54 -------------------------------------------------------------------------------- Physical Exam Details Patient Name: Date of Service: Vincent [name], Vincent [name] ID M. 08/11/2021 1:45 PM Medical Record Number: [id number] Patient Account Number: [id number] Date of Birth/Sex: Treating RN: Jan 25, 1936 (85 y.o. Vincent [name] Primary Care Provider: Wenda [name] Other Clinician: Referring Provider: Treating Provider/Extender: Guadalupe Maple, Karrar Weeks in Treatment: 3 Constitutional Well-nourished and well-hydrated in no acute distress. Respiratory normal breathing without difficulty. Psychiatric this patient is able to make decisions and demonstrates good insight into disease process. Alert and Oriented x 3. pleasant and cooperative. Notes Upon inspection patient's wound bed showed signs of good granulation and epithelization at this point. He only has 2 wounds remaining and both are measuring smaller overall I am extremely pleased with where we stand today. Electronic Signature(s) Signed: 08/11/2021 2:28:09 PM By: Worthy Keeler PA-C Entered By: Worthy Keeler on 08/11/2021 14:28:08 -------------------------------------------------------------------------------- Physician Orders Details Patient Name: Date of Service: Vincent [name], Vincent [name] ID M. 08/11/2021 1:45 PM Medical Record Number: [id number] Patient Account Number: [id number] Date of Birth/Sex: Treating RN: 1936-05-04 (85 y.o. Vincent [name] Primary Care Provider: Wenda [name] Other Clinician: Referring Provider: Treating Provider/Extender: Guadalupe Maple, Karrar Weeks in Treatment: 3 Verbal / Phone Orders: No Diagnosis Coding ICD-10 Coding Code Description [phone] Non-pressure chronic ulcer of other part of right lower leg with other specified  severity L97.828 Non-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 leg with other specified severity L97.522 Non-pressure chronic ulcer of other part of left foot with fat layer exposed I87.333 Chronic venous hypertension (idiopathic) with ulcer and inflammation of bilateral lower extremity E11.51 Type 2 diabetes mellitus with diabetic peripheral angiopathy without gangrene Follow-up Appointments ppointment in 1 week. - Mandie Crabbe-Wednesday Return A Bathing/ Shower/ Hygiene May shower with protection but do not get wound dressing(s) wet. Edema Control - Lymphedema / SCD / Other Bilateral Lower Extremities Elevate legs to the level of the heart or above for 30 minutes daily and/or when sitting, a frequency of: - whenever sitting Avoid standing for long periods of time. Exercise regularly Additional Orders / Instructions Follow Nutritious Diet - Monitor blood sugars Wound Treatment Wound #1 - Lower Leg Wound Laterality: Right, Anterior, Distal Peri-Wound Care: Triamcinolone 15 (g) 1 x Per Week/15 Days Discharge Instructions: Use triamcinolone 15 (g) mixed with lotion to lower legs Peri-Wound Care: Sween Lotion (Moisturizing lotion) 1 x Per Week/15 Days Discharge Instructions: Apply moisturizing lotion as directed Prim Dressing: KerraCel Ag Gelling Fiber Dressing, 2x2 in (silver alginate) 1 x Per Week/15 Days ary Discharge Instructions: Apply silver alginate to wound bed as instructed Secondary Dressing: Woven Gauze Sponge, Non-Sterile 4x4 in 1 x Per Week/15 Days Discharge Instructions: Apply over primary dressing as directed. Compression Wrap: Kerlix Roll 4.5x3.1 (in/yd) 1 x Per Week/15 Days Discharge Instructions: Apply Kerlix and Coban compression as directed. Compression Wrap: Coban Self-Adherent Wrap 4x5 (in/yd) 1 x Per Week/15 Days Discharge Instructions: Apply over Kerlix as directed. Wound #9 - Foot Wound Laterality: Dorsal, Left Peri-Wound Care: Triamcinolone 15 (g) 1 x Per  Week/15 Days Discharge Instructions: Use triamcinolone 15 (g) mixed with lotion to lower legs Peri-Wound Care: Sween Lotion (Moisturizing lotion) 1 x Per Week/15 Days Discharge Instructions: Apply moisturizing lotion as directed Prim Dressing: KerraCel Ag Gelling Fiber Dressing, 2x2 in (silver alginate) 1 x Per Week/15 Days ary Discharge Instructions: Apply silver alginate to wound bed as  instructed Secondary Dressing: Woven Gauze Sponge, Non-Sterile 4x4 in 1 x Per Week/15 Days Discharge Instructions: Apply over primary dressing as directed. Compression Wrap: Kerlix Roll 4.5x3.1 (in/yd) 1 x Per Week/15 Days Discharge Instructions: Apply Kerlix and Coban compression as directed. Compression Wrap: Coban Self-Adherent Wrap 4x5 (in/yd) 1 x Per Week/15 Days Discharge Instructions: Apply over Kerlix as directed. Electronic Signature(s) Signed: 08/11/2021 3:33:06 PM By: Worthy Keeler PA-C Signed: 08/11/2021 5:19:40 PM By: Lorrin Jackson Entered By: Lorrin Jackson on 08/11/2021 14:27:03 -------------------------------------------------------------------------------- Problem List Details Patient Name: Date of Service: Vincent [name], Vincent [name] ID M. 08/11/2021 1:45 PM Medical Record Number: [id number] Patient Account Number: [id number] Date of Birth/Sex: Treating RN: September 09, 1935 (85 y.o. Vincent [name] Primary Care Provider: Wenda [name] Other Clinician: Referring Provider: Treating Provider/Extender: Guadalupe Maple, Karrar Weeks in Treatment: 3 Active Problems ICD-10 Encounter Code Description Active Date MDM Diagnosis L97.818 Non-pressure chronic ulcer of other part of right lower leg with other specified 07/15/2021 No Yes severity L97.828 Non-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 leg with other specified 07/15/2021 No Yes severity L97.522 Non-pressure chronic ulcer of other part of left foot with fat layer exposed 08/04/2021 No Yes I87.333 Chronic venous hypertension  (idiopathic) with ulcer and inflammation of 07/15/2021 No Yes bilateral lower extremity E11.51 Type 2 diabetes mellitus with diabetic peripheral angiopathy without gangrene 07/15/2021 No Yes Inactive Problems Resolved Problems Electronic Signature(s) Signed: 08/11/2021 2:11:05 PM By: Worthy Keeler PA-C Entered By: Worthy Keeler on 08/11/2021 14:11:04 -------------------------------------------------------------------------------- Progress Note Details Patient Name: Date of Service: Vincent [name], Vincent [name] ID M. 08/11/2021 1:45 PM Medical Record Number: [id number] Patient Account Number: [id number] Date of Birth/Sex: Treating RN: 12/22/1935 (85 y.o. Vincent [name] Primary Care Provider: Wenda [name] Other Clinician: Referring Provider: Treating Provider/Extender: Guadalupe Maple, Karrar Weeks in Treatment: 3 Subjective Chief Complaint Information obtained from Patient 07/15/2021 patient is here for wounds on his bilateral lower legs History of Present Illness (HPI) ADMISSION 07/15/2021 This is an 85 year old man who came here for evaluation of wounds on his bilateral predominantly anterior lower legs mid aspect of the tibia. He seems to have been dealing with this for most of this year. Currently with 3 wounds on the right and 2 on the left. At least some of these seem to start off as blisters probably the majority. He has been following with Dr. Anabel Bene of [facility] dermatology according the patient back he has an appointment Dr. Anabel Bene tomorrow he was given a prescription for triamcinolone which she has been applying daily in fact it was renewed yesterday. Patient is a diabetic no recent hemoglobin A1c. He is on Coumadin for persistent atrial fibrillation. Vein and vascular in December 2021 at which time he had bilateral foot pain and finger pain. ABIs bilaterally were noncompressible however the TBI on the right was only 0.39. There were biphasic waveforms on the left his  ABI was noncompressible with biphasic waveforms but with a TBI of 0.6. He was not felt to have a primary vascular issue. There is a [name] of Raynaud's phenomenon as well. Past medical history includes cellulitis of the right leg in June 2022, type 2 diabetes, persistent lower extremity edema, bilateral foot and ankle pain, Raynaud's phenomenon, coronary artery disease, congestive heart failure, pacemaker, none sustained ventricular tachycardia, remote CVA paroxysmal atrial fib on Coumadin. We did not reattempt ABIs in our clinic 07/22/2019 patient appears to be doing decently well in regard to his wounds as compared to last  week with the wounds that were present last week. Fortunately I do not see any signs of active infection which is great news. No fevers, chills, nausea, vomiting, or diarrhea. With that being said he has several new blisters that are getting need to be addressed today. 07/28/2021 upon evaluation today patient actually appears to be making excellent progress. I am extremely pleased with where we stand today. I think that his wounds are significantly improved compared to where they were previous. 08/04/2021 upon evaluation today patient appears to be doing much better in regard to his wounds in general. I feel like across the board he is showing signs of significant improvement. Fortunately there is no evidence of infection which is great news. No fevers, chills, nausea, vomiting, or diarrhea. 08/11/2021 upon evaluation today patient actually appears to be doing quite well in regard to his wound. Has been tolerating the dressing changes without complication. Fortunately I do not see any signs of infection currently which is great news as well. No fevers, chills, nausea, vomiting, or diarrhea. Objective Constitutional Well-nourished and well-hydrated in no acute distress. Vitals Time Taken: 1:55 PM, Height: 69 in, Weight: 185 lbs, BMI: 27.3, Temperature: 97.5 F, Pulse: 65 bpm,  Respiratory Rate: 18 breaths/min, Blood Pressure: 119/71 mmHg, Capillary Blood Glucose: 130 mg/dl. Respiratory normal breathing without difficulty. Psychiatric this patient is able to make decisions and demonstrates good insight into disease process. Alert and Oriented x 3. pleasant and cooperative. General Notes: Upon inspection patient's wound bed showed signs of good granulation and epithelization at this point. He only has 2 wounds remaining and both are measuring smaller overall I am extremely pleased with where we stand today. Integumentary (Hair, Skin) Wound #1 status is Open. Original cause of wound was Gradually Appeared. The date acquired was: 04/29/2021. The wound has been in treatment 3 weeks. The wound is located on the Right,Distal,Anterior Lower Leg. The wound measures 1cm length x 0.7cm width x 0.1cm depth; 0.55cm^2 area and 0.055cm^3 volume. There is Fat Layer (Subcutaneous Tissue) exposed. There is no tunneling or undermining noted. There is a medium amount of serosanguineous drainage noted. The wound margin is distinct with the outline attached to the wound base. There is large (67-100%) red granulation within the wound bed. There is a small (1-33%) amount of necrotic tissue within the wound bed including Adherent Slough. Wound #4 status is Healed - Epithelialized. Original cause of wound was Blister. The date acquired was: 04/29/2021. The wound has been in treatment 3 weeks. The wound is located on the Left,Anterior Lower Leg. The wound measures 0cm length x 0cm width x 0cm depth; 0cm^2 area and 0cm^3 volume. Wound #6 status is Healed - Epithelialized. Original cause of wound was Blister. The date acquired was: 07/21/2021. The wound has been in treatment 3 weeks. The wound is located on the [facility] Lower Leg. The wound measures 0cm length x 0cm width x 0cm depth; 0cm^2 area and 0cm^3 volume. Wound #9 status is Open. Original cause of wound was Gradually Appeared. The  date acquired was: 08/04/2021. The wound has been in treatment 1 weeks. The wound is located on the Left,Dorsal Foot. The wound measures 0.4cm length x 0.3cm width x 0.1cm depth; 0.094cm^2 area and 0.009cm^3 volume. There is Fat Layer (Subcutaneous Tissue) exposed. There is no tunneling or undermining noted. There is a medium amount of serosanguineous drainage noted. The wound margin is distinct with the outline attached to the wound base. There is small (1-33%) granulation within the wound bed. There  is a large (67-100%) amount of necrotic tissue within the wound bed including Adherent Slough. Assessment Active Problems ICD-10 Non-pressure chronic ulcer of other part of right lower leg with other specified severity Non-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 leg with other specified severity Non-pressure chronic ulcer of other part of left foot with fat layer exposed Chronic venous hypertension (idiopathic) with ulcer and inflammation of bilateral lower extremity Type 2 diabetes mellitus with diabetic peripheral angiopathy without gangrene Plan Follow-up Appointments: Return Appointment in 1 week. - Jillann Charette-Wednesday Bathing/ Shower/ Hygiene: May shower with protection but do not get wound dressing(s) wet. Edema Control - Lymphedema / SCD / Other: Elevate legs to the level of the heart or above for 30 minutes daily and/or when sitting, a frequency of: - whenever sitting Avoid standing for long periods of time. Exercise regularly Additional Orders / Instructions: Follow Nutritious Diet - Monitor blood sugars WOUND #1: - Lower Leg Wound Laterality: Right, Anterior, Distal Peri-Wound Care: Triamcinolone 15 (g) 1 x Per Week/15 Days Discharge Instructions: Use triamcinolone 15 (g) mixed with lotion to lower legs Peri-Wound Care: Sween Lotion (Moisturizing lotion) 1 x Per Week/15 Days Discharge Instructions: Apply moisturizing lotion as directed Prim Dressing: KerraCel Ag Gelling Fiber  Dressing, 2x2 in (silver alginate) 1 x Per Week/15 Days ary Discharge Instructions: Apply silver alginate to wound bed as instructed Secondary Dressing: Woven Gauze Sponge, Non-Sterile 4x4 in 1 x Per Week/15 Days Discharge Instructions: Apply over primary dressing as directed. Com pression Wrap: Kerlix Roll 4.5x3.1 (in/yd) 1 x Per Week/15 Days Discharge Instructions: Apply Kerlix and Coban compression as directed. Com pression Wrap: Coban Self-Adherent Wrap 4x5 (in/yd) 1 x Per Week/15 Days Discharge Instructions: Apply over Kerlix as directed. WOUND #9: - Foot Wound Laterality: Dorsal, Left Peri-Wound Care: Triamcinolone 15 (g) 1 x Per Week/15 Days Discharge Instructions: Use triamcinolone 15 (g) mixed with lotion to lower legs Peri-Wound Care: Sween Lotion (Moisturizing lotion) 1 x Per Week/15 Days Discharge Instructions: Apply moisturizing lotion as directed Prim Dressing: KerraCel Ag Gelling Fiber Dressing, 2x2 in (silver alginate) 1 x Per Week/15 Days ary Discharge Instructions: Apply silver alginate to wound bed as instructed Secondary Dressing: Woven Gauze Sponge, Non-Sterile 4x4 in 1 x Per Week/15 Days Discharge Instructions: Apply over primary dressing as directed. Com pression Wrap: Kerlix Roll 4.5x3.1 (in/yd) 1 x Per Week/15 Days Discharge Instructions: Apply Kerlix and Coban compression as directed. Com pression Wrap: Coban Self-Adherent Wrap 4x5 (in/yd) 1 x Per Week/15 Days Discharge Instructions: Apply over Kerlix as directed. 1. I would recommend that we continue as before with the wound care measures. I do think he is making good progress and overall I am extremely pleased with where things stand today. This includes the use of the silver alginate dressing to the open wound locations. 2. We will also get a use roll gauze to secure in place followed by Coban. 3. I am also can recommend he should be elevating his legs much as possible along with the Curlex and Coban wraps this  is keeping the edema under good control. We will see patient back for reevaluation in 1 week here in the clinic. If anything worsens or changes patient will contact our office for additional recommendations. Electronic Signature(s) Signed: 08/11/2021 2:28:48 PM By: Worthy Keeler PA-C Entered By: Worthy Keeler on 08/11/2021 14:28:47 -------------------------------------------------------------------------------- SuperBill Details Patient Name: Date of Service: Vincent [name], Vincent [name] ID M. 08/11/2021 Medical Record Number: [id number] Patient Account Number: [id number] Date of Birth/Sex:  Treating RN: 09-30-1935 (85 y.o. Vincent [name] Primary Care Provider: Wenda [name] Other Clinician: Referring Provider: Treating Provider/Extender: Guadalupe Maple, Karrar Weeks in Treatment: 3 Diagnosis Coding ICD-10 Codes Code Description [id number] Non-pressure chronic ulcer of other part of right lower leg with other specified severity L97.828 Non-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 leg with other specified severity L97.522 Non-pressure chronic ulcer of other part of left foot with fat layer exposed I87.333 Chronic venous hypertension (idiopathic) with ulcer and inflammation of bilateral lower extremity E11.51 Type 2 diabetes mellitus with diabetic peripheral angiopathy without gangrene Facility Procedures CPT4 Code: [id number] Description: 99213 - WOUND CARE VISIT-LEV 3 EST PT Modifier: Quantity: 1 Physician Procedures : CPT4 Code Description Modifier [id number] - WC PHYS LEVEL 3 - EST PT ICD-10 Diagnosis Description L97.818 Non-pressure chronic ulcer of other part of right lower leg with other specified severity L97.828 Non-pressure chronic ulcer of other part of  left lower leg with other specified severity L97.522 Non-pressure chronic ulcer of other part of left foot with fat layer exposed I87.333 Chronic venous hypertension (idiopathic) with ulcer and inflammation of  bilateral lower extremity Quantity: 1 Electronic Signature(s) Signed: 08/11/2021 2:29:03 PM By: Worthy Keeler PA-C Entered By: Worthy Keeler on 08/11/2021 14:29:03

## 2021-08-18 ENCOUNTER — Encounter (HOSPITAL_BASED_OUTPATIENT_CLINIC_OR_DEPARTMENT_OTHER): Payer: Medicare Other | Admitting: Physician Assistant

## 2021-08-18 ENCOUNTER — Other Ambulatory Visit: Payer: Self-pay

## 2021-08-18 DIAGNOSIS — E1151 Type 2 diabetes mellitus with diabetic peripheral angiopathy without gangrene: Secondary | ICD-10-CM | POA: Diagnosis not present

## 2021-08-18 DIAGNOSIS — L97828 Non-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 leg with other specified severity: Secondary | ICD-10-CM | POA: Diagnosis not present

## 2021-08-18 DIAGNOSIS — I87333 Chronic venous hypertension (idiopathic) with ulcer and inflammation of bilateral lower extremity: Secondary | ICD-10-CM | POA: Diagnosis not present

## 2021-08-18 DIAGNOSIS — L97522 Non-pressure chronic ulcer of other part of left foot with fat layer exposed: Secondary | ICD-10-CM | POA: Diagnosis not present

## 2021-08-18 DIAGNOSIS — L97812 Non-pressure chronic ulcer of other part of right lower leg with fat layer exposed: Secondary | ICD-10-CM | POA: Diagnosis not present

## 2021-08-18 DIAGNOSIS — L97818 Non-pressure chronic ulcer of other part of right lower leg with other specified severity: Secondary | ICD-10-CM | POA: Diagnosis not present

## 2021-08-19 NOTE — Progress Notes (Addendum)
Vincent [name], Vincent M. ([id number]) Visit Report for 08/18/2021 Chief Complaint Document Details Patient Name: Date of Service: Vincent [name] ID M. 08/18/2021 1:45 PM Medical Record Number: [id number] Patient Account Number: [id number] Date of Birth/Sex: Treating RN: Nov 01, 1935 (85 y.o. [name] Primary Care Provider: [name] Other Clinician: Referring Provider: Treating Provider/Extender: Guadalupe Maple, Karrar Weeks in Treatment: 4 Information Obtained from: Patient Chief Complaint 07/15/2021 patient is here for wounds on his bilateral lower legs Electronic Signature(s) Signed: 08/18/2021 1:54:09 PM By: Worthy Keeler PA-C Entered By: Worthy Keeler on 08/18/2021 13:54:08 -------------------------------------------------------------------------------- HPI Details Patient Name: Date of Service: Vincent Vincent [name], Vincent [name] ID M. 08/18/2021 1:45 PM Medical Record Number: [id number] Patient Account Number: [id number] Date of Birth/Sex: Treating RN: 1936/01/17 (85 y.o. [name] Primary Care Provider: [name] Other Clinician: Referring Provider: Treating Provider/Extender: Guadalupe Maple, Karrar Weeks in Treatment: 4 History of Present Illness HPI Description: ADMISSION 07/15/2021 This is an 85 year old man who came here for evaluation of wounds on his bilateral predominantly anterior lower legs mid aspect of the tibia. He seems to have been dealing with this for most of this year. Currently with 3 wounds on the right and 2 on the left. At least some of these seem to start off as blisters probably the majority. He has been following with Dr. Anabel Bene of [facility] dermatology according the patient back he has an appointment Dr. Anabel Bene tomorrow he was given a prescription for triamcinolone which she has been applying daily in fact it was renewed yesterday. Patient is a diabetic no recent hemoglobin A1c. He is on Coumadin for persistent atrial  fibrillation. Vein and vascular in December 2021 at which time he had bilateral foot pain and finger pain. ABIs bilaterally were noncompressible however the TBI on the right was only 0.39. There were biphasic waveforms on the left his ABI was noncompressible with biphasic waveforms but with a TBI of 0.6. He was not felt to have a primary vascular issue. There is a mention of Raynaud's phenomenon as well. Past medical history includes cellulitis of the right leg in June 2022, type 2 diabetes, persistent lower extremity edema, bilateral foot and ankle pain, Raynaud's phenomenon, coronary artery disease, congestive heart failure, pacemaker, none sustained ventricular tachycardia, remote CVA paroxysmal atrial fib on Coumadin. We did not reattempt ABIs in our clinic 07/22/2019 patient appears to be doing decently well in regard to his wounds as compared to last week with the wounds that were present last week. Fortunately I do not see any signs of active infection which is great news. No fevers, chills, nausea, vomiting, or diarrhea. With that being said he has several new blisters that are getting need to be addressed today. 07/28/2021 upon evaluation today patient actually appears to be making excellent progress. I am extremely pleased with where we stand today. I think that his wounds are significantly improved compared to where they were previous. 08/04/2021 upon evaluation today patient appears to be doing much better in regard to his wounds in general. I feel like across the board he is showing signs of significant improvement. Fortunately there is no evidence of infection which is great news. No fevers, chills, nausea, vomiting, or diarrhea. 08/11/2021 upon evaluation today patient actually appears to be doing quite well in regard to his wound. Has been tolerating the dressing changes without complication. Fortunately I do not see any signs of infection currently which is great news as well. No  fevers, chills, nausea,  vomiting, or diarrhea. 08/18/2021 on evaluation today patient appears to be doing excellent he is making good progress and overall I am extremely pleased with where we stand today. There does not appear to be any signs of active infection at this time which is great news. Electronic Signature(s) Signed: 08/18/2021 2:53:05 PM By: Worthy Keeler PA-C Entered By: Worthy Keeler on 08/18/2021 14:53:05 -------------------------------------------------------------------------------- Physical Exam Details Patient Name: Date of Service: Vincent Vincent [name], Shaune Pascal ID M. 08/18/2021 1:45 PM Medical Record Number: [id number] Patient Account Number: [id number] Date of Birth/Sex: Treating RN: [birth year] (84 y.o. [name] Primary Care Provider: [name] Other Clinician: Referring Provider: Treating Provider/Extender: Guadalupe Maple, Karrar Weeks in Treatment: 4 Constitutional Well-nourished and well-hydrated in no acute distress. Respiratory normal breathing without difficulty. Psychiatric this patient is able to make decisions and demonstrates good insight into disease process. Alert and Oriented x 3. pleasant and cooperative. Notes Upon inspection patient's wound bed showed signs of good granulation epithelization at this point. Fortunately I do not see any evidence of active infection currently which is great and overall I think that the patient is making excellent progress. Electronic Signature(s) Signed: 08/18/2021 2:53:22 PM By: Worthy Keeler PA-C Entered By: Worthy Keeler on 08/18/2021 14:53:22 -------------------------------------------------------------------------------- Physician Orders Details Patient Name: Date of Service: Vincent Vincent [name], Vincent [name] ID M. 08/18/2021 1:45 PM Medical Record Number: [id number] Patient Account Number: [id number] Date of Birth/Sex: Treating RN: 04-Nov-1935 (85 y.o. [name] Primary Care Provider: [name] Other  Clinician: Referring Provider: Treating Provider/Extender: Guadalupe Maple, Karrar Weeks in Treatment: 4 Verbal / Phone Orders: No Diagnosis Coding ICD-10 Coding Code Description [phone] Non-pressure chronic ulcer of other part of right lower leg with other specified severity L97.828 Non-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 leg with other specified severity L97.522 Non-pressure chronic ulcer of other part of left foot with fat layer exposed I87.333 Chronic venous hypertension (idiopathic) with ulcer and inflammation of bilateral lower extremity E11.51 Type 2 diabetes mellitus with diabetic peripheral angiopathy without gangrene Follow-up Appointments ppointment in 2 weeks. Margarita Grizzle Wednesday 09/01/2020 Return A ****Bring in stockings.**** Nurse Visit: - Wednesday 08/25/2021 Bathing/ Shower/ Hygiene May shower with protection but do not get wound dressing(s) wet. Edema Control - Lymphedema / SCD / Other Bilateral Lower Extremities Elevate legs to the level of the heart or above for 30 minutes daily and/or when sitting, a frequency of: - whenever sitting Avoid standing for long periods of time. Exercise regularly Additional Orders / Instructions Follow Nutritious Diet - Monitor blood sugars Wound Treatment Wound #1 - Lower Leg Wound Laterality: Right, Anterior, Distal Peri-Wound Care: Sween Lotion (Moisturizing lotion) 1 x Per Week/15 Days Discharge Instructions: Apply moisturizing lotion as directed Prim Dressing: KerraCel Ag Gelling Fiber Dressing, 2x2 in (silver alginate) 1 x Per Week/15 Days ary Discharge Instructions: Apply silver alginate to wound bed as instructed Secondary Dressing: Woven Gauze Sponge, Non-Sterile 4x4 in 1 x Per Week/15 Days Discharge Instructions: Apply over primary dressing as directed. Compression Wrap: Kerlix Roll 4.5x3.1 (in/yd) 1 x Per Week/15 Days Discharge Instructions: Apply Kerlix and Coban compression as directed. Compression  Wrap: Coban Self-Adherent Wrap 4x5 (in/yd) 1 x Per Week/15 Days Discharge Instructions: Apply over Kerlix as directed. Wound #9 - Foot Wound Laterality: Dorsal, Left Peri-Wound Care: Sween Lotion (Moisturizing lotion) 1 x Per Week/15 Days Discharge Instructions: Apply moisturizing lotion as directed Prim Dressing: KerraCel Ag Gelling Fiber Dressing, 2x2 in (silver alginate) 1 x  Per Week/15 Days ary Discharge Instructions: Apply silver alginate to wound bed as instructed Secondary Dressing: Woven Gauze Sponge, Non-Sterile 4x4 in 1 x Per Week/15 Days Discharge Instructions: Apply over primary dressing as directed. Compression Wrap: Kerlix Roll 4.5x3.1 (in/yd) 1 x Per Week/15 Days Discharge Instructions: Apply Kerlix and Coban compression as directed. Compression Wrap: Coban Self-Adherent Wrap 4x5 (in/yd) 1 x Per Week/15 Days Discharge Instructions: Apply over Kerlix as directed. Electronic Signature(s) Signed: 08/18/2021 5:18:31 PM By: Worthy Keeler PA-C Signed: 08/18/2021 6:10:28 PM By: Deon Pilling RN, BSN Entered By: Deon Pilling on 08/18/2021 14:52:44 -------------------------------------------------------------------------------- Problem List Details Patient Name: Date of Service: Vincent [name], Vincent [name] ID M. 08/18/2021 1:45 PM Medical Record Number: [id number] Patient Account Number: [id number] Date of Birth/Sex: Treating RN: 01/29/1936 (85 y.o. [name] Primary Care Provider: [name] Other Clinician: Referring Provider: Treating Provider/Extender: Guadalupe Maple, Karrar Weeks in Treatment: 4 Active Problems ICD-10 Encounter Code Description Active Date MDM Diagnosis L97.818 Non-pressure chronic ulcer of other part of right lower leg with other specified 07/15/2021 No Yes severity L97.828 Non-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 leg with other specified 07/15/2021 No Yes severity L97.522 Non-pressure chronic ulcer of other part of left foot  with fat layer exposed 08/04/2021 No Yes I87.333 Chronic venous hypertension (idiopathic) with ulcer and inflammation of 07/15/2021 No Yes bilateral lower extremity E11.51 Type 2 diabetes mellitus with diabetic peripheral angiopathy without gangrene 07/15/2021 No Yes Inactive Problems Resolved Problems Electronic Signature(s) Signed: 08/18/2021 1:53:30 PM By: Worthy Keeler PA-C Entered By: Worthy Keeler on 08/18/2021 13:53:30 -------------------------------------------------------------------------------- Progress Note Details Patient Name: Date of Service: Vincent Vincent [name], Vincent [name] ID M. 08/18/2021 1:45 PM Medical Record Number: [id number] Patient Account Number: [id number] Date of Birth/Sex: Treating RN: June 17, 1936 (85 y.o. [name] Primary Care Provider: [name] Other Clinician: Referring Provider: Treating Provider/Extender: Guadalupe Maple, Karrar Weeks in Treatment: 4 Subjective Chief Complaint Information obtained from Patient 07/15/2021 patient is here for wounds on his bilateral lower legs History of Present Illness (HPI) ADMISSION 07/15/2021 This is an 85 year old man who came here for evaluation of wounds on his bilateral predominantly anterior lower legs mid aspect of the tibia. He seems to have been dealing with this for most of this year. Currently with 3 wounds on the right and 2 on the left. At least some of these seem to start off as blisters probably the majority. He has been following with Dr. Anabel Bene of [facility] dermatology according the patient back he has an appointment Dr. Anabel Bene tomorrow he was given a prescription for triamcinolone which she has been applying daily in fact it was renewed yesterday. Patient is a diabetic no recent hemoglobin A1c. He is on Coumadin for persistent atrial fibrillation. Vein and vascular in December 2021 at which time he had bilateral foot pain and finger pain. ABIs bilaterally were noncompressible however the TBI  on the right was only 0.39. There were biphasic waveforms on the left his ABI was noncompressible with biphasic waveforms but with a TBI of 0.6. He was not felt to have a primary vascular issue. There is a mention of Raynaud's phenomenon as well. Past medical history includes cellulitis of the right leg in June 2022, type 2 diabetes, persistent lower extremity edema, bilateral foot and ankle pain, Raynaud's phenomenon, coronary artery disease, congestive heart failure, pacemaker, none sustained ventricular tachycardia, remote CVA paroxysmal atrial fib on Coumadin. We did not reattempt ABIs in our clinic 07/22/2019 patient  appears to be doing decently well in regard to his wounds as compared to last week with the wounds that were present last week. Fortunately I do not see any signs of active infection which is great news. No fevers, chills, nausea, vomiting, or diarrhea. With that being said he has several new blisters that are getting need to be addressed today. 07/28/2021 upon evaluation today patient actually appears to be making excellent progress. I am extremely pleased with where we stand today. I think that his wounds are significantly improved compared to where they were previous. 08/04/2021 upon evaluation today patient appears to be doing much better in regard to his wounds in general. I feel like across the board he is showing signs of significant improvement. Fortunately there is no evidence of infection which is great news. No fevers, chills, nausea, vomiting, or diarrhea. 08/11/2021 upon evaluation today patient actually appears to be doing quite well in regard to his wound. Has been tolerating the dressing changes without complication. Fortunately I do not see any signs of infection currently which is great news as well. No fevers, chills, nausea, vomiting, or diarrhea. 08/18/2021 on evaluation today patient appears to be doing excellent he is making good progress and overall I am  extremely pleased with where we stand today. There does not appear to be any signs of active infection at this time which is great news. Objective Constitutional Well-nourished and well-hydrated in no acute distress. Vitals Time Taken: 2:08 PM, Height: 69 in, Weight: 185 lbs, BMI: 27.3, Temperature: 97.6 F, Pulse: 94 bpm, Respiratory Rate: 18 breaths/min, Blood Pressure: 113/78 mmHg, Capillary Blood Glucose: 130 mg/dl. Respiratory normal breathing without difficulty. Psychiatric this patient is able to make decisions and demonstrates good insight into disease process. Alert and Oriented x 3. pleasant and cooperative. General Notes: Upon inspection patient's wound bed showed signs of good granulation epithelization at this point. Fortunately I do not see any evidence of active infection currently which is great and overall I think that the patient is making excellent progress. Integumentary (Hair, Skin) Wound #1 status is Open. Original cause of wound was Gradually Appeared. The date acquired was: 04/29/2021. The wound has been in treatment 4 weeks. The wound is located on the Right,Distal,Anterior Lower Leg. The wound measures 0.7cm length x 0.4cm width x 0.1cm depth; 0.22cm^2 area and 0.022cm^3 volume. There is Fat Layer (Subcutaneous Tissue) exposed. There is no tunneling or undermining noted. There is a medium amount of serosanguineous drainage noted. The wound margin is distinct with the outline attached to the wound base. There is large (67-100%) red granulation within the wound bed. There is a small (1-33%) amount of necrotic tissue within the wound bed including Adherent Slough. Wound #9 status is Open. Original cause of wound was Gradually Appeared. The date acquired was: 08/04/2021. The wound has been in treatment 2 weeks. The wound is located on the Left,Dorsal Foot. The wound measures 0.1cm length x 0.1cm width x 0.1cm depth; 0.008cm^2 area and 0.001cm^3 volume. There is Fat Layer  (Subcutaneous Tissue) exposed. There is no tunneling or undermining noted. There is a medium amount of serosanguineous drainage noted. The wound margin is distinct with the outline attached to the wound base. There is large (67-100%) pink, pale granulation within the wound bed. There is no necrotic tissue within the wound bed. Wound #9 status is Open. Original cause of wound was Gradually Appeared. The date acquired was: 08/04/2021. The wound has been in treatment 2 weeks. The wound is located on  the Left,Dorsal Foot. The wound measures 0.1cm length x 0.1cm width x 0.1cm depth; 0.008cm^2 area and 0.001cm^3 volume. There is Fat Layer (Subcutaneous Tissue) exposed. There is no tunneling or undermining noted. There is a medium amount of serosanguineous drainage noted. The wound margin is distinct with the outline attached to the wound base. There is large (67-100%) pink, pale granulation within the wound bed. There is no necrotic tissue within the wound bed. Assessment Active Problems ICD-10 Non-pressure chronic ulcer of other part of right lower leg with other specified severity Non-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 leg with other specified severity Non-pressure chronic ulcer of other part of left foot with fat layer exposed Chronic venous hypertension (idiopathic) with ulcer and inflammation of bilateral lower extremity Type 2 diabetes mellitus with diabetic peripheral angiopathy without gangrene Plan Follow-up Appointments: Return Appointment in 2 weeks. Margarita Grizzle Wednesday 09/01/2020 ****Bring in stockings.**** Nurse Visit: - Wednesday 08/25/2021 Bathing/ Shower/ Hygiene: May shower with protection but do not get wound dressing(s) wet. Edema Control - Lymphedema / SCD / Other: Elevate legs to the level of the heart or above for 30 minutes daily and/or when sitting, a frequency of: - whenever sitting Avoid standing for long periods of time. Exercise regularly Additional Orders  / Instructions: Follow Nutritious Diet - Monitor blood sugars WOUND #1: - Lower Leg Wound Laterality: Right, Anterior, Distal Peri-Wound Care: Sween Lotion (Moisturizing lotion) 1 x Per Week/15 Days Discharge Instructions: Apply moisturizing lotion as directed Prim Dressing: KerraCel Ag Gelling Fiber Dressing, 2x2 in (silver alginate) 1 x Per Week/15 Days ary Discharge Instructions: Apply silver alginate to wound bed as instructed Secondary Dressing: Woven Gauze Sponge, Non-Sterile 4x4 in 1 x Per Week/15 Days Discharge Instructions: Apply over primary dressing as directed. Com pression Wrap: Kerlix Roll 4.5x3.1 (in/yd) 1 x Per Week/15 Days Discharge Instructions: Apply Kerlix and Coban compression as directed. Com pression Wrap: Coban Self-Adherent Wrap 4x5 (in/yd) 1 x Per Week/15 Days Discharge Instructions: Apply over Kerlix as directed. WOUND #9: - Foot Wound Laterality: Dorsal, Left Peri-Wound Care: Sween Lotion (Moisturizing lotion) 1 x Per Week/15 Days Discharge Instructions: Apply moisturizing lotion as directed Prim Dressing: KerraCel Ag Gelling Fiber Dressing, 2x2 in (silver alginate) 1 x Per Week/15 Days ary Discharge Instructions: Apply silver alginate to wound bed as instructed Secondary Dressing: Woven Gauze Sponge, Non-Sterile 4x4 in 1 x Per Week/15 Days Discharge Instructions: Apply over primary dressing as directed. Com pression Wrap: Kerlix Roll 4.5x3.1 (in/yd) 1 x Per Week/15 Days Discharge Instructions: Apply Kerlix and Coban compression as directed. Com pression Wrap: Coban Self-Adherent Wrap 4x5 (in/yd) 1 x Per Week/15 Days Discharge Instructions: Apply over Kerlix as directed. 1. I would recommend that we go ahead and continue with the wound care measures as before and the patient is in agreement with plan. There are 2 remaining spots 1 on the left at the top of the foot 1 on the right leg both of which are showing signs of excellent improvement and I am hopeful that  by the next time I see him he will be ready for discharge. 2. I am also can recommend that we continue with the compression wraps. We have been doing a Curlex and Coban wrap which I think is doing a great job as well. We will see patient back for reevaluation in 1 week here in the clinic. If anything worsens or changes patient will contact our office for additional recommendations. Electronic Signature(s) Signed: 08/18/2021 2:53:52 PM By: Joaquim Lai  III, Katina Remick PA-C Entered By: Worthy Keeler on 08/18/2021 14:53:51 -------------------------------------------------------------------------------- SuperBill Details Patient Name: Date of Service: Vincent Vincent [name], Vincent [name] ID M. 08/18/2021 Medical Record Number: [id number] Patient Account Number: [id number] Date of Birth/Sex: Treating RN: 1936-07-27 (85 y.o. [name] Primary Care Provider: [name] Other Clinician: Referring Provider: Treating Provider/Extender: Guadalupe Maple, Karrar Weeks in Treatment: 4 Diagnosis Coding ICD-10 Codes Code Description [phone] Non-pressure chronic ulcer of other part of right lower leg with other specified severity L97.828 Non-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 leg with other specified severity L97.522 Non-pressure chronic ulcer of other part of left foot with fat layer exposed I87.333 Chronic venous hypertension (idiopathic) with ulcer and inflammation of bilateral lower extremity E11.51 Type 2 diabetes mellitus with diabetic peripheral angiopathy without gangrene Facility Procedures CPT4 Code: [id number] Description: [phone] - WOUND CARE VISIT-LEV 5 EST PT Modifier: Quantity: 1 Physician Procedures : CPT4 Code Description Modifier [id number] - WC PHYS LEVEL 3 - EST PT ICD-10 Diagnosis Description L97.818 Non-pressure chronic ulcer of other part of right lower leg with other specified severity L97.828 Non-pressure chronic ulcer of other part of  left lower leg with other specified  severity L97.522 Non-pressure chronic ulcer of other part of left foot with fat layer exposed I87.333 Chronic venous hypertension (idiopathic) with ulcer and inflammation of bilateral lower extremity Quantity: 1 Electronic Signature(s) Signed: 08/18/2021 5:18:31 PM By: Worthy Keeler PA-C Signed: 08/18/2021 6:10:28 PM By: Deon Pilling RN, BSN Previous Signature: 08/18/2021 2:54:48 PM Version By: Worthy Keeler PA-C Entered By: Deon Pilling on 08/18/2021 15:05:30

## 2021-08-19 NOTE — Progress Notes (Signed)
Routzahn, Mohan M. ([id number]) Visit Report for 08/18/2021 Arrival Information Details Patient Name: Date of Service: [name] ID M. 08/18/2021 1:45 PM Medical Record Number: [id number] Patient Account Number: [id number] Date of Birth/Sex: Treating RN: 02-02-1936 (85 y.o. [name] Primary Care Amaar Oshita: [name] Other Clinician: Referring Isaac Lacson: Treating Kenyanna Grzesiak/Extender: Guadalupe Maple, Karrar Weeks in Treatment: 4 Visit Information History Since Last Visit Added or deleted any medications: No Patient Arrived: Ambulatory Any new allergies or adverse reactions: No Arrival Time: 14:06 Had a fall or experienced change in No Accompanied By: self activities of daily living that may affect Transfer Assistance: None risk of falls: Patient Identification Verified: Yes Signs or symptoms of abuse/neglect since last visito No Secondary Verification Process Completed: Yes Hospitalized since last visit: No Patient Requires Transmission-Based Precautions: No Implantable device outside of the clinic excluding No Patient Has Alerts: Yes cellular tissue based products placed in the center Patient Alerts: Patient on Blood Thinner since last visit: R ABI = 1.43 TBI= .39 Has Dressing in Place as Prescribed: Yes L ABI = 1.85 TBI= .6 Pain Present Now: No Electronic Signature(s) Signed: 08/19/2021 9:32:36 AM By: Sandre Kitty Entered By: Sandre Kitty on 08/18/2021 14:07:08 -------------------------------------------------------------------------------- Clinic Level of Care Assessment Details Patient Name: Date of Service: [name] ID M. 08/18/2021 1:45 PM Medical Record Number: [id number] Patient Account Number: [id number] Date of Birth/Sex: Treating RN: 12-Dec-1935 (85 y.o. [name] Primary Care Kalisa Girtman: [name] Other Clinician: Referring Janellie Tennison: Treating Hussein Macdougal/Extender: Guadalupe Maple, Karrar Weeks in Treatment: 4 Clinic  Level of Care Assessment Items TOOL 4 Quantity Score X- 1 0 Use when only an EandM is performed on FOLLOW-UP visit ASSESSMENTS - Nursing Assessment / Reassessment X- 1 10 Reassessment of Co-morbidities (includes updates in patient status) X- 1 5 Reassessment of Adherence to Treatment Plan ASSESSMENTS - Wound and Skin A ssessment / Reassessment '[]'  - 0 Simple Wound Assessment / Reassessment - one wound X- 2 5 Complex Wound Assessment / Reassessment - multiple wounds X- 1 10 Dermatologic / Skin Assessment (not related to wound area) ASSESSMENTS - Focused Assessment X- 2 5 Circumferential Edema Measurements - multi extremities X- 1 10 Nutritional Assessment / Counseling / Intervention '[]'  - 0 Lower Extremity Assessment (monofilament, tuning fork, pulses) '[]'  - 0 Peripheral Arterial Disease Assessment (using hand held doppler) ASSESSMENTS - Ostomy and/or Continence Assessment and Care '[]'  - 0 Incontinence Assessment and Management '[]'  - 0 Ostomy Care Assessment and Management (repouching, etc.) PROCESS - Coordination of Care '[]'  - 0 Simple Patient / Family Education for ongoing care X- 1 20 Complex (extensive) Patient / Family Education for ongoing care X- 1 10 Staff obtains Programmer, systems, Records, T Results / Process Orders est '[]'  - 0 Staff telephones HHA, Nursing Homes / Clarify orders / etc '[]'  - 0 Routine Transfer to another Facility (non-emergent condition) '[]'  - 0 Routine Hospital Admission (non-emergent condition) '[]'  - 0 New Admissions / Biomedical engineer / Ordering NPWT Apligraf, etc. , '[]'  - 0 Emergency Hospital Admission (emergent condition) '[]'  - 0 Simple Discharge Coordination X- 1 15 Complex (extensive) Discharge Coordination PROCESS - Special Needs '[]'  - 0 Pediatric / Minor Patient Management '[]'  - 0 Isolation Patient Management '[]'  - 0 Hearing / Language / Visual special needs '[]'  - 0 Assessment of Community assistance (transportation, D/C planning,  etc.) '[]'  - 0 Additional assistance / Altered mentation '[]'  - 0 Support Surface(s) Assessment (bed, cushion, seat, etc.) INTERVENTIONS - Wound Cleansing /  Measurement '[]'  - 0 Simple Wound Cleansing - one wound X- 2 5 Complex Wound Cleansing - multiple wounds X- 1 5 Wound Imaging (photographs - any number of wounds) '[]'  - 0 Wound Tracing (instead of photographs) '[]'  - 0 Simple Wound Measurement - one wound X- 2 5 Complex Wound Measurement - multiple wounds INTERVENTIONS - Wound Dressings '[]'  - 0 Small Wound Dressing one or multiple wounds '[]'  - 0 Medium Wound Dressing one or multiple wounds X- 2 20 Large Wound Dressing one or multiple wounds '[]'  - 0 Application of Medications - topical '[]'  - 0 Application of Medications - injection INTERVENTIONS - Miscellaneous '[]'  - 0 External ear exam '[]'  - 0 Specimen Collection (cultures, biopsies, blood, body fluids, etc.) '[]'  - 0 Specimen(s) / Culture(s) sent or taken to Lab for analysis '[]'  - 0 Patient Transfer (multiple staff / Civil Service fast streamer / Similar devices) '[]'  - 0 Simple Staple / Suture removal (25 or less) '[]'  - 0 Complex Staple / Suture removal (26 or more) '[]'  - 0 Hypo / Hyperglycemic Management (close monitor of Blood Glucose) '[]'  - 0 Ankle / Brachial Index (ABI) - do not check if billed separately X- 1 5 Vital Signs Has the patient been seen at the hospital within the last three years: Yes Total Score: 170 Level Of Care: New/Established - Level 5 Electronic Signature(s) Signed: 08/18/2021 6:10:28 PM By: Deon Pilling RN, BSN Entered By: Deon Pilling on 08/18/2021 15:01:59 -------------------------------------------------------------------------------- Encounter Discharge Information Details Patient Name: Date of Service: [name], [name] 08/18/2021 1:45 PM Medical Record Number: [id number] Patient Account Number: [id number] Date of Birth/Sex: Treating RN: 08-19-1936 (85 y.o. [name] Primary Care Jumana Paccione: [name] Other Clinician: Referring Keidra Withers: Treating Leidi Astle/Extender: Guadalupe Maple, Karrar Weeks in Treatment: 4 Encounter Discharge Information Items Discharge Condition: Stable Ambulatory Status: Cane Discharge Destination: Home Transportation: Private Auto Accompanied By: self Schedule Follow-up Appointment: Yes Clinical Summary of Care: Electronic Signature(s) Signed: 08/18/2021 6:10:28 PM By: Deon Pilling RN, BSN Entered By: Deon Pilling on 08/18/2021 14:56:43 -------------------------------------------------------------------------------- Lower Extremity Assessment Details Patient Name: Date of Service: [name], Shaune Pascal ID M. 08/18/2021 1:45 PM Medical Record Number: [id number] Patient Account Number: [id number] Date of Birth/Sex: Treating RN: 1936/04/29 (85 y.o. [name] Primary Care Jazen Spraggins: [name] Other Clinician: Referring Kiyoko Mcguirt: Treating Deeksha Cotrell/Extender: Guadalupe Maple, Karrar Weeks in Treatment: 4 Edema Assessment Assessed: Shirlyn Goltz: Yes] Patrice Paradise: No] Edema: [Left: Yes] [Right: Yes] Calf Left: Right: Point of Measurement: From Medial Instep 35 cm 33 cm Ankle Left: Right: Point of Measurement: From Medial Instep 22 cm 21.8 cm Vascular Assessment Pulses: Dorsalis Pedis Palpable: [Left:Yes] [Right:Yes] Electronic Signature(s) Signed: 08/18/2021 6:10:28 PM By: Deon Pilling RN, BSN Entered By: Deon Pilling on 08/18/2021 14:13:27 -------------------------------------------------------------------------------- [place] Details Patient Name: Date of Service: [name], [name] 08/18/2021 1:45 PM Medical Record Number: [id number] Patient Account Number: [id number] Date of Birth/Sex: Treating RN: 08-26-1936 (85 y.o. [name] Primary Care Moxon Messler: [name] Other Clinician: Referring Meckenzie Balsley: Treating Chaeli Judy/Extender: Veatrice Bourbon in Treatment: Rabun reviewed with physician Active Inactive Nutrition Nursing Diagnoses: Impaired glucose control: actual or potential Potential for alteratiion in Nutrition/Potential for imbalanced nutrition Goals: Patient/caregiver will maintain therapeutic glucose control Date Initiated: 07/15/2021 Target Resolution Date: 09/15/2021 Goal Status: Active Interventions: Assess HgA1c results as ordered upon admission and as needed Assess patient nutrition upon admission and as needed per policy Provide education on elevated  blood sugars and impact on wound healing Treatment Activities: Dietary management education, guidance and counseling : 07/15/2021 Giving encouragement to exercise : 07/15/2021 Patient referred to Primary Care Physician for further nutritional evaluation : 07/15/2021 Notes: 08/11/21: Glucose control ongoing Venous Leg Ulcer Nursing Diagnoses: Knowledge deficit related to disease process and management Potential for venous Insuffiency (use before diagnosis confirmed) Goals: Patient will maintain optimal edema control Date Initiated: 07/15/2021 Target Resolution Date: 09/15/2021 Goal Status: Active Interventions: Assess peripheral edema status every visit. Compression as ordered Provide education on venous insufficiency Treatment Activities: Therapeutic compression applied : 07/15/2021 Notes: 08/11/21: Edema control ongoing Wound/Skin Impairment Nursing Diagnoses: Impaired tissue integrity Knowledge deficit related to ulceration/compromised skin integrity Goals: Patient/caregiver will verbalize understanding of skin care regimen Date Initiated: 07/15/2021 Target Resolution Date: 09/15/2021 Goal Status: Active Ulcer/skin breakdown will have a volume reduction of 30% by week 4 Date Initiated: 07/15/2021 Date Inactivated: 08/11/2021 Target Resolution Date: 08/12/2021 Goal Status: Met Ulcer/skin breakdown will have a volume reduction of 50% by week 8 Date  Initiated: 08/11/2021 Target Resolution Date: 09/15/2021 Goal Status: Active Interventions: Assess patient/caregiver ability to obtain necessary supplies Assess patient/caregiver ability to perform ulcer/skin care regimen upon admission and as needed Assess ulceration(s) every visit Provide education on ulcer and skin care Treatment Activities: Skin care regimen initiated : 07/15/2021 Topical wound management initiated : 07/15/2021 Notes: Electronic Signature(s) Signed: 08/18/2021 6:10:28 PM By: Deon Pilling RN, BSN Entered By: Deon Pilling on 08/18/2021 14:53:10 -------------------------------------------------------------------------------- Pain Assessment Details Patient Name: Date of Service: [name], [name] 08/18/2021 1:45 PM Medical Record Number: [id number] Patient Account Number: [id number] Date of Birth/Sex: Treating RN: [birth year] (86 y.o. [name] Primary Care Chantelle Verdi: [name] Other Clinician: Referring Sabrin Dunlevy: Treating Cordelro Gautreau/Extender: Guadalupe Maple, Karrar Weeks in Treatment: 4 Active Problems Location of Pain Severity and Description of Pain Patient Has Paino No Site Locations Pain Management and Medication Current Pain Management: Electronic Signature(s) Signed: 08/18/2021 6:08:54 PM By: Baruch Gouty RN, BSN Signed: 08/19/2021 9:32:36 AM By: Sandre Kitty Entered By: Sandre Kitty on 08/18/2021 14:07:40 -------------------------------------------------------------------------------- Patient/Caregiver Education Details Patient Name: Date of Service: DA Burgess Estelle 12/21/2022andnbsp1:45 PM Medical Record Number: [id number] Patient Account Number: [id number] Date of Birth/Gender: Treating RN: 09/14/1935 (85 y.o. [name] Primary Care Physician: [name] Other Clinician: Referring Physician: Treating Physician/Extender: Guadalupe Maple, Fransisca Connors in Treatment: 4 Education  Assessment Education Provided To: Patient Education Topics Provided Wound/Skin Impairment: Handouts: Skin Care Do's and Dont's Methods: Explain/Verbal Responses: Reinforcements needed Electronic Signature(s) Signed: 08/18/2021 6:10:28 PM By: Deon Pilling RN, BSN Entered By: Deon Pilling on 08/18/2021 14:54:06 -------------------------------------------------------------------------------- Wound Assessment Details Patient Name: Date of Service: [name], Shaune Pascal ID M. 08/18/2021 1:45 PM Medical Record Number: [id number] Patient Account Number: [id number] Date of Birth/Sex: Treating RN: [birth year] (85 y.o. [name] Primary Care Gabby Rackers: [name] Other Clinician: Referring Zavon Hyson: Treating Briselda Naval/Extender: Guadalupe Maple, Karrar Weeks in Treatment: 4 Wound Status Wound Number: 1 Primary Diabetic Wound/Ulcer of the Lower Extremity Etiology: Wound Location: Right, Distal, Anterior Lower Leg Secondary Venous Leg Ulcer Wounding Event: Gradually Appeared Etiology: Date Acquired: 04/29/2021 Wound Open Weeks Of Treatment: 4 Status: Clustered Wound: No Comorbid Cataracts, Glaucoma, Chronic Obstructive Pulmonary Disease History: (COPD), Arrhythmia, Congestive Heart Failure, Coronary Artery Disease, Hypertension, Type II Diabetes, Gout, Neuropathy Photos Wound Measurements Length: (cm) 0.7 Width: (cm) 0.4 Depth: (cm) 0.1 Area: (cm) 0.22 Volume: (cm) 0.022 % Reduction in Area: 96.7% % Reduction in  Volume: 96.7% Epithelialization: Large (67-100%) Tunneling: No Undermining: No Wound Description Classification: Grade 1 Wound Margin: Distinct, outline attached Exudate Amount: Medium Exudate Type: Serosanguineous Exudate Color: red, brown Foul Odor After Cleansing: No Slough/Fibrino Yes Wound Bed Granulation Amount: Large (67-100%) Exposed Structure Granulation Quality: Red Fascia Exposed: No Necrotic Amount: Small (1-33%) Fat Layer (Subcutaneous  Tissue) Exposed: Yes Necrotic Quality: Adherent Slough Tendon Exposed: No Muscle Exposed: No Joint Exposed: No Bone Exposed: No Treatment Notes Wound #1 (Lower Leg) Wound Laterality: Right, Anterior, Distal Cleanser Peri-Wound Care Sween Lotion (Moisturizing lotion) Discharge Instruction: Apply moisturizing lotion as directed Topical Primary Dressing KerraCel Ag Gelling Fiber Dressing, 2x2 in (silver alginate) Discharge Instruction: Apply silver alginate to wound bed as instructed Secondary Dressing Woven Gauze Sponge, Non-Sterile 4x4 in Discharge Instruction: Apply over primary dressing as directed. Secured With Compression Wrap Kerlix Roll 4.5x3.1 (in/yd) Discharge Instruction: Apply Kerlix and Coban compression as directed. Coban Self-Adherent Wrap 4x5 (in/yd) Discharge Instruction: Apply over Kerlix as directed. Compression Stockings Add-Ons Electronic Signature(s) Signed: 08/18/2021 5:25:22 PM By: Dellie Catholic RN Signed: 08/18/2021 6:10:28 PM By: Deon Pilling RN, BSN Entered By: Dellie Catholic on 08/18/2021 14:14:37 -------------------------------------------------------------------------------- Wound Assessment Details Patient Name: Date of Service: [name], [name] 08/18/2021 1:45 PM Medical Record Number: [id number] Patient Account Number: [id number] Date of Birth/Sex: Treating RN: 06/12/1936 (85 y.o. [name] Primary Care Eleanor Dimichele: [name] Other Clinician: Referring Lucy Boardman: Treating Jahmel Flannagan/Extender: Guadalupe Maple, Karrar Weeks in Treatment: 4 Wound Status Wound Number: 9 Primary Diabetic Wound/Ulcer of the Lower Extremity Etiology: Wound Location: Left, Dorsal Foot Secondary Lymphedema Wounding Event: Gradually Appeared Etiology: Date Acquired: 08/04/2021 Wound Open Weeks Of Treatment: 2 Status: Clustered Wound: No Comorbid Cataracts, Glaucoma, Chronic Obstructive Pulmonary Disease History: (COPD), Arrhythmia,  Congestive Heart Failure, Coronary Artery Disease, Hypertension, Type II Diabetes, Gout, Neuropathy Wound Measurements Length: (cm) 0.1 Width: (cm) 0.1 Depth: (cm) 0.1 Area: (cm) 0.008 Volume: (cm) 0.001 % Reduction in Area: 91.5% % Reduction in Volume: 88.9% Epithelialization: Large (67-100%) Tunneling: No Undermining: No Wound Description Classification: Grade 2 Wound Margin: Distinct, outline attached Exudate Amount: Medium Exudate Type: Serosanguineous Exudate Color: red, brown Foul Odor After Cleansing: No Slough/Fibrino No Wound Bed Granulation Amount: Large (67-100%) Exposed Structure Granulation Quality: Pink, Pale Fascia Exposed: No Necrotic Amount: None Present (0%) Fat Layer (Subcutaneous Tissue) Exposed: Yes Tendon Exposed: No Muscle Exposed: No Joint Exposed: No Bone Exposed: No Electronic Signature(s) Signed: 08/18/2021 6:10:28 PM By: Deon Pilling RN, BSN Entered By: Deon Pilling on 08/18/2021 14:14:54 -------------------------------------------------------------------------------- Wound Assessment Details Patient Name: Date of Service: [name], [name] 08/18/2021 1:45 PM Medical Record Number: [id number] Patient Account Number: [id number] Date of Birth/Sex: Treating RN: [birth year] (85 y.o. [name] Primary Care Branon Sabine: [name] Other Clinician: Referring Jarion Hawthorne: Treating Laquanna Veazey/Extender: Guadalupe Maple, Karrar Weeks in Treatment: 4 Wound Status Wound Number: 9 Primary Diabetic Wound/Ulcer of the Lower Extremity Etiology: Wound Location: Left, Dorsal Foot Secondary Lymphedema Wounding Event: Gradually Appeared Etiology: Etiology: Date Acquired: 08/04/2021 Wound Open Weeks Of Treatment: 2 Status: Clustered Wound: No Comorbid Cataracts, Glaucoma, Chronic Obstructive Pulmonary Disease History: (COPD), Arrhythmia, Congestive Heart Failure, Coronary Artery Disease, Hypertension, Type II Diabetes, Gout,  Neuropathy Photos Wound Measurements Length: (cm) 0.1 Width: (cm) 0.1 Depth: (cm) 0.1 Area: (cm) 0.008 Volume: (cm) 0.001 % Reduction in Area: 91.5% % Reduction in Volume: 88.9% Epithelialization: Large (67-100%) Tunneling: No Undermining: No Wound Description Classification: Grade 2 Wound Margin: Distinct, outline attached Exudate Amount: Medium  Exudate Type: Serosanguineous Exudate Color: red, brown Foul Odor After Cleansing: No Slough/Fibrino No Wound Bed Granulation Amount: Large (67-100%) Exposed Structure Granulation Quality: Pink, Pale Fascia Exposed: No Necrotic Amount: None Present (0%) Fat Layer (Subcutaneous Tissue) Exposed: Yes Tendon Exposed: No Muscle Exposed: No Joint Exposed: No Bone Exposed: No Treatment Notes Wound #9 (Foot) Wound Laterality: Dorsal, Left Cleanser Peri-Wound Care Sween Lotion (Moisturizing lotion) Discharge Instruction: Apply moisturizing lotion as directed Topical Primary Dressing KerraCel Ag Gelling Fiber Dressing, 2x2 in (silver alginate) Discharge Instruction: Apply silver alginate to wound bed as instructed Secondary Dressing Woven Gauze Sponge, Non-Sterile 4x4 in Discharge Instruction: Apply over primary dressing as directed. Secured With Compression Wrap Kerlix Roll 4.5x3.1 (in/yd) Discharge Instruction: Apply Kerlix and Coban compression as directed. Coban Self-Adherent Wrap 4x5 (in/yd) Discharge Instruction: Apply over Kerlix as directed. Compression Stockings Add-Ons Electronic Signature(s) Signed: 08/18/2021 5:25:22 PM By: Dellie Catholic RN Signed: 08/18/2021 6:10:28 PM By: Deon Pilling RN, BSN Entered By: Dellie Catholic on 08/18/2021 14:15:50 -------------------------------------------------------------------------------- Vitals Details Patient Name: Date of Service: [name], [name] 08/18/2021 1:45 PM Medical Record Number: [id number] Patient Account Number: [id number] Date of Birth/Sex: Treating  RN: 09/28/1935 (85 y.o. [name] Primary Care Francetta Ilg: [name] Other Clinician: Referring Lilinoe Acklin: Treating Tevis Conger/Extender: Guadalupe Maple, Karrar Weeks in Treatment: 4 Vital Signs Time Taken: 14:08 Temperature (F): 97.6 Height (in): 69 Pulse (bpm): 94 Weight (lbs): 185 Respiratory Rate (breaths/min): 18 Body Mass Index (BMI): 27.3 Blood Pressure (mmHg): 113/78 Capillary Blood Glucose (mg/dl): 130 Reference Range: 80 - 120 mg / dl Electronic Signature(s) Signed: 08/19/2021 9:32:36 AM By: Sandre Kitty Entered By: Sandre Kitty on 08/18/2021 14:07:31

## 2021-08-25 ENCOUNTER — Other Ambulatory Visit: Payer: Self-pay

## 2021-08-25 ENCOUNTER — Encounter (HOSPITAL_BASED_OUTPATIENT_CLINIC_OR_DEPARTMENT_OTHER): Payer: Medicare Other | Admitting: Internal Medicine

## 2021-08-25 DIAGNOSIS — L97828 Non-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 leg with other specified severity: Secondary | ICD-10-CM | POA: Diagnosis not present

## 2021-08-25 DIAGNOSIS — L97522 Non-pressure chronic ulcer of other part of left foot with fat layer exposed: Secondary | ICD-10-CM | POA: Diagnosis not present

## 2021-08-25 DIAGNOSIS — I87333 Chronic venous hypertension (idiopathic) with ulcer and inflammation of bilateral lower extremity: Secondary | ICD-10-CM | POA: Diagnosis not present

## 2021-08-25 DIAGNOSIS — L97818 Non-pressure chronic ulcer of other part of right lower leg with other specified severity: Secondary | ICD-10-CM | POA: Diagnosis not present

## 2021-08-25 DIAGNOSIS — E1151 Type 2 diabetes mellitus with diabetic peripheral angiopathy without gangrene: Secondary | ICD-10-CM | POA: Diagnosis not present

## 2021-08-25 NOTE — Progress Notes (Signed)
Gruszka, Cypress M. ([id number]) Visit Report for 08/25/2021 Arrival Information Details Patient Name: Date of Service: [name] ID M. 08/25/2021 1:30 PM Medical Record Number: [id number] Patient Account Number: [id number] Date of Birth/Sex: Treating RN: [birth year] (85 y.o. Vincent [name] Primary Care Pattye Meda: [name] Other Clinician: Referring Alvia Tory: Treating Johnnye Sandford/Extender: Bryon Lions in Treatment: 5 Visit Information History Since Last Visit Added or deleted any medications: No Patient Arrived: Ambulatory Any new allergies or adverse reactions: No Arrival Time: 13:45 Had a fall or experienced change in No Accompanied By: self activities of daily living that may affect Transfer Assistance: None risk of falls: Patient Identification Verified: Yes Signs or symptoms of abuse/neglect since last visito No Patient Requires Transmission-Based Precautions: No Hospitalized since last visit: No Patient Has Alerts: Yes Implantable device outside of the clinic excluding No Patient Alerts: Patient on Blood Thinner cellular tissue based products placed in the center R ABI = 1.43 TBI= .39 since last visit: L ABI = 1.85 TBI= .6 Has Dressing in Place as Prescribed: Yes Has Compression in Place as Prescribed: Yes Pain Present Now: No Electronic Signature(s) Signed: 08/25/2021 5:15:38 PM By: Dellie Catholic RN Entered By: Dellie Catholic on 08/25/2021 13:52:10 -------------------------------------------------------------------------------- Clinic Level of Care Assessment Details Patient Name: Date of Service: [name] ID M. 08/25/2021 1:30 PM Medical Record Number: [id number] Patient Account Number: [id number] Date of Birth/Sex: Treating RN: 28-Oct-1935 (85 y.o. [name] Primary Care Lydiann Bonifas: [name] Other Clinician: Referring Darrell Leonhardt: Treating Young Mulvey/Extender: Bryon Lions in Treatment:  5 Clinic Level of Care Assessment Items TOOL 4 Quantity Score []  - 0 Use when only an EandM is performed on FOLLOW-UP visit ASSESSMENTS - Nursing Assessment / Reassessment X- 1 10 Reassessment of Co-morbidities (includes updates in patient status) X- 1 5 Reassessment of Adherence to Treatment Plan ASSESSMENTS - Wound and Skin A ssessment / Reassessment []  - 0 Simple Wound Assessment / Reassessment - one wound X- 2 5 Complex Wound Assessment / Reassessment - multiple wounds []  - 0 Dermatologic / Skin Assessment (not related to wound area) ASSESSMENTS - Focused Assessment []  - 0 Circumferential Edema Measurements - multi extremities []  - 0 Nutritional Assessment / Counseling / Intervention []  - 0 Lower Extremity Assessment (monofilament, tuning fork, pulses) []  - 0 Peripheral Arterial Disease Assessment (using hand held doppler) ASSESSMENTS - Ostomy and/or Continence Assessment and Care []  - 0 Incontinence Assessment and Management []  - 0 Ostomy Care Assessment and Management (repouching, etc.) PROCESS - Coordination of Care X - Simple Patient / Family Education for ongoing care 1 15 []  - 0 Complex (extensive) Patient / Family Education for ongoing care X- 1 10 Staff obtains Programmer, systems, Records, T Results / Process Orders est []  - 0 Staff telephones HHA, Nursing Homes / Clarify orders / etc []  - 0 Routine Transfer to another Facility (non-emergent condition) []  - 0 Routine Hospital Admission (non-emergent condition) []  - 0 New Admissions / Biomedical engineer / Ordering NPWT Apligraf, etc. , []  - 0 Emergency Hospital Admission (emergent condition) X- 1 10 Simple Discharge Coordination []  - 0 Complex (extensive) Discharge Coordination PROCESS - Special Needs []  - 0 Pediatric / Minor Patient Management []  - 0 Isolation Patient Management []  - 0 Hearing / Language / Visual special needs []  - 0 Assessment of Community assistance (transportation, D/C planning,  etc.) []  - 0 Additional assistance / Altered mentation []  - 0 Support Surface(s) Assessment (bed, cushion, seat, etc.) INTERVENTIONS - Wound  Cleansing / Measurement []  - 0 Simple Wound Cleansing - one wound X- 2 5 Complex Wound Cleansing - multiple wounds []  - 0 Wound Imaging (photographs - any number of wounds) []  - 0 Wound Tracing (instead of photographs) []  - 0 Simple Wound Measurement - one wound []  - 0 Complex Wound Measurement - multiple wounds INTERVENTIONS - Wound Dressings X - Small Wound Dressing one or multiple wounds 2 10 []  - 0 Medium Wound Dressing one or multiple wounds []  - 0 Large Wound Dressing one or multiple wounds []  - 0 Application of Medications - topical []  - 0 Application of Medications - injection INTERVENTIONS - Miscellaneous []  - 0 External ear exam []  - 0 Specimen Collection (cultures, biopsies, blood, body fluids, etc.) []  - 0 Specimen(s) / Culture(s) sent or taken to Lab for analysis []  - 0 Patient Transfer (multiple staff / Civil Service fast streamer / Similar devices) []  - 0 Simple Staple / Suture removal (25 or less) []  - 0 Complex Staple / Suture removal (26 or more) []  - 0 Hypo / Hyperglycemic Management (close monitor of Blood Glucose) []  - 0 Ankle / Brachial Index (ABI) - do not check if billed separately X- 1 5 Vital Signs Has the patient been seen at the hospital within the last three years: Yes Total Score: 95 Level Of Care: New/Established - Level 3 Electronic Signature(s) Signed: 08/25/2021 4:57:25 PM By: Baruch Gouty RN, BSN Entered By: Baruch Gouty on 08/25/2021 14:59:35 -------------------------------------------------------------------------------- Encounter Discharge Information Details Patient Name: Date of Service: [name], [name] 08/25/2021 1:30 PM Medical Record Number: [id number] Patient Account Number: [id number] Date of Birth/Sex: Treating RN: [birth year] (85 y.o. Vincent [name] Primary Care Shine Scrogham: [name] Other Clinician: Referring Morris Longenecker: Treating Geraldy Akridge/Extender: Bryon Lions in Treatment: 5 Encounter Discharge Information Items Discharge Condition: Stable Ambulatory Status: Ambulatory Discharge Destination: Home Transportation: Private Auto Accompanied By: self Schedule Follow-up Appointment: Yes Clinical Summary of Care: Patient Declined Electronic Signature(s) Signed: 08/25/2021 5:15:38 PM By: Dellie Catholic RN Entered By: Dellie Catholic on 08/25/2021 14:37:06 -------------------------------------------------------------------------------- Patient/Caregiver Education Details Patient Name: Date of Service: DA Burgess Estelle 12/28/2022andnbsp1:30 PM Medical Record Number: [id number] Patient Account Number: [id number] Date of Birth/Gender: Treating RN: 1936-01-21 (85 y.o. Vincent [name] Primary Care Physician: [name] Other Clinician: Referring Physician: Treating Physician/Extender: Bryon Lions in Treatment: 5 Education Assessment Education Provided To: Patient Education Topics Provided Elevated Blood Sugar/ Impact on Healing: Methods: Explain/Verbal Electronic Signature(s) Signed: 08/25/2021 5:15:38 PM By: Dellie Catholic RN Entered By: Dellie Catholic on 08/25/2021 14:36:45 -------------------------------------------------------------------------------- Wound Assessment Details Patient Name: Date of Service: [name], [name] 08/25/2021 1:30 PM Medical Record Number: [id number] Patient Account Number: [id number] Date of Birth/Sex: Treating RN: 30-Jan-1936 (85 y.o. Vincent [name] Primary Care Tomeika Weinmann: [name] Other Clinician: Referring Gilad Dugger: Treating Rihan Schueler/Extender: Bryon Lions in Treatment: 5 Wound Status Wound Number: 1 Primary Etiology: Diabetic Wound/Ulcer of the Lower Extremity Wound Location: Right, Distal, Anterior Lower Leg Secondary  Etiology: Venous Leg Ulcer Wounding Event: Gradually Appeared Wound Status: Open Date Acquired: 04/29/2021 Weeks Of Treatment: 5 Clustered Wound: No Wound Measurements Length: (cm) 0.7 Width: (cm) 0.4 Depth: (cm) 0.1 Area: (cm) 0.22 Volume: (cm) 0.022 % Reduction in Area: 96.7% % Reduction in Volume: 96.7% Wound Description Classification: Grade 1 Exudate Amount: Medium Exudate Type: Serosanguineous Exudate Color: red, brown Treatment Notes Wound #1 (Lower Leg) Wound Laterality: Right, Anterior, Distal Cleanser Peri-Wound Care Sween  Lotion (Moisturizing lotion) Discharge Instruction: Apply moisturizing lotion as directed Topical Primary Dressing KerraCel Ag Gelling Fiber Dressing, 2x2 in (silver alginate) Discharge Instruction: Apply silver alginate to wound bed as instructed Secondary Dressing Woven Gauze Sponge, Non-Sterile 4x4 in Discharge Instruction: Apply over primary dressing as directed. Secured With Compression Wrap Kerlix Roll 4.5x3.1 (in/yd) Discharge Instruction: Apply Kerlix and Coban compression as directed. Coban Self-Adherent Wrap 4x5 (in/yd) Discharge Instruction: Apply over Kerlix as directed. Compression Stockings Add-Ons Electronic Signature(s) Signed: 08/25/2021 5:15:38 PM By: Dellie Catholic RN Entered By: Dellie Catholic on 08/25/2021 13:53:16 -------------------------------------------------------------------------------- Vitals Details Patient Name: Date of Service: [name], [name] 08/25/2021 1:30 PM Medical Record Number: [id number] Patient Account Number: [id number] Date of Birth/Sex: Treating RN: [birth year] (85 y.o. Vincent [name] Primary Care Shaily Librizzi: [name] Other Clinician: Referring Aryana Wonnacott: Treating Kevia Zaucha/Extender: Bryon Lions in Treatment: 5 Vital Signs Time Taken: 13:52 Temperature (F): 97.8 Height (in): 69 Pulse (bpm): 97 Weight (lbs): 185 Respiratory Rate (breaths/min):  18 Body Mass Index (BMI): 27.3 Blood Pressure (mmHg): 108/71 Reference Range: 80 - 120 mg / dl Electronic Signature(s) Signed: 08/25/2021 5:15:38 PM By: Dellie Catholic RN Entered By: Dellie Catholic on 08/25/2021 13:52:42

## 2021-08-25 NOTE — Progress Notes (Signed)
[place], Kaleth M. ([id number]) Visit Report for 08/25/2021 SuperBill Details Patient Name: Date of Service: [name] ID M. 08/25/2021 Medical Record Number: [id number] Patient Account Number: [id number] Date of Birth/Sex: Treating RN: May 09, 1936 (85 y.o. 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

## 2021-09-01 ENCOUNTER — Encounter (HOSPITAL_BASED_OUTPATIENT_CLINIC_OR_DEPARTMENT_OTHER): Payer: Medicare Other | Attending: Physician Assistant | Admitting: Physician Assistant

## 2021-09-01 ENCOUNTER — Other Ambulatory Visit: Payer: Self-pay

## 2021-09-01 DIAGNOSIS — I7300 Raynaud's syndrome without gangrene: Secondary | ICD-10-CM | POA: Insufficient documentation

## 2021-09-01 DIAGNOSIS — I110 Hypertensive heart disease with heart failure: Secondary | ICD-10-CM | POA: Diagnosis not present

## 2021-09-01 DIAGNOSIS — I509 Heart failure, unspecified: Secondary | ICD-10-CM | POA: Insufficient documentation

## 2021-09-01 DIAGNOSIS — Z8673 Personal history of transient ischemic attack (TIA), and cerebral infarction without residual deficits: Secondary | ICD-10-CM | POA: Diagnosis not present

## 2021-09-01 DIAGNOSIS — L97828 Non-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 leg with other specified severity: Secondary | ICD-10-CM | POA: Insufficient documentation

## 2021-09-01 DIAGNOSIS — L97812 Non-pressure chronic ulcer of other part of right lower leg with fat layer exposed: Secondary | ICD-10-CM | POA: Diagnosis not present

## 2021-09-01 DIAGNOSIS — L97522 Non-pressure chronic ulcer of other part of left foot with fat layer exposed: Secondary | ICD-10-CM | POA: Diagnosis not present

## 2021-09-01 DIAGNOSIS — I2510 Atherosclerotic heart disease of native coronary artery without angina pectoris: Secondary | ICD-10-CM | POA: Diagnosis not present

## 2021-09-01 DIAGNOSIS — I87333 Chronic venous hypertension (idiopathic) with ulcer and inflammation of bilateral lower extremity: Secondary | ICD-10-CM | POA: Diagnosis not present

## 2021-09-01 DIAGNOSIS — L97818 Non-pressure chronic ulcer of other part of right lower leg with other specified severity: Secondary | ICD-10-CM | POA: Diagnosis not present

## 2021-09-01 DIAGNOSIS — I480 Paroxysmal atrial fibrillation: Secondary | ICD-10-CM | POA: Insufficient documentation

## 2021-09-01 DIAGNOSIS — E1151 Type 2 diabetes mellitus with diabetic peripheral angiopathy without gangrene: Secondary | ICD-10-CM | POA: Diagnosis not present

## 2021-09-01 DIAGNOSIS — Z950 Presence of cardiac pacemaker: Secondary | ICD-10-CM | POA: Diagnosis not present

## 2021-09-01 DIAGNOSIS — Z7901 Long term (current) use of anticoagulants: Secondary | ICD-10-CM | POA: Diagnosis not present

## 2021-09-01 DIAGNOSIS — I872 Venous insufficiency (chronic) (peripheral): Secondary | ICD-10-CM | POA: Diagnosis not present

## 2021-09-01 DIAGNOSIS — E1152 Type 2 diabetes mellitus with diabetic peripheral angiopathy with gangrene: Secondary | ICD-10-CM | POA: Diagnosis not present

## 2021-09-01 NOTE — Progress Notes (Addendum)
[name], [name] ([id number]) Visit Report for 09/01/2021 Chief Complaint Document Details Patient Name: Date of Service: Vincent Vincent [name] ID M. 09/01/2021 3:00 PM Medical Record Number: [id number] Patient Account Number: [id number] Date of Birth/Sex: Treating RN: 07/16/1936 (86 y.o. [name] Primary Care Provider: [name] Other Clinician: Referring Provider: Treating Provider/Extender: Guadalupe Maple, Karrar Weeks in Treatment: 6 Information Obtained from: Patient Chief Complaint 07/15/2021 patient is here for wounds on his bilateral lower legs Electronic Signature(s) Signed: 09/01/2021 3:35:16 PM By: Worthy Keeler PA-C Entered By: Worthy Keeler on 09/01/2021 15:35:16 -------------------------------------------------------------------------------- HPI Details Patient Name: Date of Service: Vincent [name], Vincent V ID M. 09/01/2021 3:00 PM Medical Record Number: [id number] Patient Account Number: [id number] Date of Birth/Sex: Treating RN: [birth year] (86 y.o. [name] Primary Care Provider: [name] Other Clinician: Referring Provider: Treating Provider/Extender: Guadalupe Maple, Karrar Weeks in Treatment: 6 History of Present Illness HPI Description: ADMISSION 07/15/2021 This is an 86 year old man who came here for evaluation of wounds on his bilateral predominantly anterior lower legs mid aspect of the tibia. He seems to have been dealing with this for most of this year. Currently with 3 wounds on the right and 2 on the left. At least some of these seem to start off as blisters probably the majority. He has been following with Dr. Anabel Bene of [facility] dermatology according the patient back he has an appointment Dr. Anabel Bene tomorrow he was given a prescription for triamcinolone which she has been applying daily in fact it was renewed yesterday. Patient is a diabetic no recent hemoglobin A1c. He is on Coumadin for persistent atrial fibrillation. Vein and  vascular in December 2021 at which time he had bilateral foot pain and finger pain. ABIs bilaterally were noncompressible however the TBI on the right was only 0.39. There were biphasic waveforms on the left his ABI was noncompressible with biphasic waveforms but with a TBI of 0.6. He was not felt to have a primary vascular issue. There is a mention of Raynaud's phenomenon as well. Past medical history includes cellulitis of the right leg in June 2022, type 2 diabetes, persistent lower extremity edema, bilateral foot and ankle pain, Raynaud's phenomenon, coronary artery disease, congestive heart failure, pacemaker, none sustained ventricular tachycardia, remote CVA paroxysmal atrial fib on Coumadin. We did not reattempt ABIs in our clinic 07/22/2019 patient appears to be doing decently well in regard to his wounds as compared to last week with the wounds that were present last week. Fortunately I do not see any signs of active infection which is great news. No fevers, chills, nausea, vomiting, or diarrhea. With that being said he has several new blisters that are getting need to be addressed today. 07/28/2021 upon evaluation today patient actually appears to be making excellent progress. I am extremely pleased with where we stand today. I think that his wounds are significantly improved compared to where they were previous. 08/04/2021 upon evaluation today patient appears to be doing much better in regard to his wounds in general. I feel like across the board he is showing signs of significant improvement. Fortunately there is no evidence of infection which is great news. No fevers, chills, nausea, vomiting, or diarrhea. 08/11/2021 upon evaluation today patient actually appears to be doing quite well in regard to his wound. Has been tolerating the dressing changes without complication. Fortunately I do not see any signs of infection currently which is great news as well. No fevers, chills, nausea,  vomiting, or diarrhea. 08/18/2021 on evaluation today patient appears to be doing excellent he is making good progress and overall I am extremely pleased with where we stand today. There does not appear to be any signs of active infection at this time which is great news. 09/01/2020 upon evaluation patient's wounds on both the left and the right appear to be at 0.1 cm measurement and are very close to complete resolution. Were not quite completely done yet but this is very close. Overall I am extremely happy with where things stand and I think he is headed in the right direction. Electronic Signature(s) Signed: 09/01/2021 4:31:06 PM By: Worthy Keeler PA-C Entered By: Worthy Keeler on 09/01/2021 16:31:06 -------------------------------------------------------------------------------- Physical Exam Details Patient Name: Date of Service: Vincent [name], Vincent V ID M. 09/01/2021 3:00 PM Medical Record Number: [id number] Patient Account Number: [id number] Date of Birth/Sex: Treating RN: 09-Jun-1936 (86 y.o. [name] Primary Care Provider: [name] Other Clinician: Referring Provider: Treating Provider/Extender: Guadalupe Maple, Karrar Weeks in Treatment: 6 Constitutional Well-nourished and well-hydrated in no acute distress. Respiratory normal breathing without difficulty. Psychiatric this patient is able to make decisions and demonstrates good insight into disease process. Alert and Oriented x 3. pleasant and cooperative. Notes Patient's wounds again are showing signs of being almost completely healed 0.1 is the measurement pretty much at both wound locations left and right remaining. This is excellent news. Electronic Signature(s) Signed: 09/01/2021 4:31:35 PM By: Worthy Keeler PA-C Entered By: Worthy Keeler on 09/01/2021 16:31:34 -------------------------------------------------------------------------------- Physician Orders Details Patient Name: Date of Service: Vincent [name],  Vincent V ID M. 09/01/2021 3:00 PM Medical Record Number: [id number] Patient Account Number: [id number] Date of Birth/Sex: Treating RN: 09/06/1935 (86 y.o. [name] Primary Care Provider: [name] Other Clinician: Referring Provider: Treating Provider/Extender: Guadalupe Maple, Karrar Weeks in Treatment: 6 Verbal / Phone Orders: No Diagnosis Coding ICD-10 Coding Code Description [phone] Non-pressure chronic ulcer of other part of right lower leg with other specified severity L97.828 Non-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 leg with other specified severity L97.522 Non-pressure chronic ulcer of other part of left foot with fat layer exposed I87.333 Chronic venous hypertension (idiopathic) with ulcer and inflammation of bilateral lower extremity E11.51 Type 2 diabetes mellitus with diabetic peripheral angiopathy without gangrene Follow-up Appointments ppointment in 1 week. - with Margarita Grizzle Return A Other: - bring in compression stockings to next appointment Bathing/ Shower/ Hygiene May shower with protection but do not get wound dressing(s) wet. Edema Control - Lymphedema / SCD / Other Bilateral Lower Extremities Elevate legs to the level of the heart or above for 30 minutes daily and/or when sitting, a frequency of: - whenever sitting Avoid standing for long periods of time. Exercise regularly Additional Orders / Instructions Follow Nutritious Diet - Monitor blood sugars Wound Treatment Wound #1 - Lower Leg Wound Laterality: Right, Anterior, Distal Peri-Wound Care: Sween Lotion (Moisturizing lotion) 1 x Per Week/15 Days Discharge Instructions: Apply moisturizing lotion as directed Prim Dressing: KerraCel Ag Gelling Fiber Dressing, 2x2 in (silver alginate) 1 x Per Week/15 Days ary Discharge Instructions: Apply silver alginate to wound bed as instructed Secondary Dressing: Woven Gauze Sponge, Non-Sterile 4x4 in 1 x Per Week/15 Days Discharge Instructions:  Apply over primary dressing as directed. Compression Wrap: Kerlix Roll 4.5x3.1 (in/yd) 1 x Per Week/15 Days Discharge Instructions: Apply Kerlix and Coban compression as directed. Compression Wrap: Coban Self-Adherent Wrap 4x5 (in/yd) 1 x Per Week/15 Days  Discharge Instructions: Apply over Kerlix as directed. Wound #9 - Foot Wound Laterality: Dorsal, Left Peri-Wound Care: Sween Lotion (Moisturizing lotion) 1 x Per Week/15 Days Discharge Instructions: Apply moisturizing lotion as directed Prim Dressing: KerraCel Ag Gelling Fiber Dressing, 2x2 in (silver alginate) 1 x Per Week/15 Days ary Discharge Instructions: Apply silver alginate to wound bed as instructed Secondary Dressing: Woven Gauze Sponge, Non-Sterile 4x4 in 1 x Per Week/15 Days Discharge Instructions: Apply over primary dressing as directed. Compression Wrap: Kerlix Roll 4.5x3.1 (in/yd) 1 x Per Week/15 Days Discharge Instructions: Apply Kerlix and Coban compression as directed. Compression Wrap: Coban Self-Adherent Wrap 4x5 (in/yd) 1 x Per Week/15 Days Discharge Instructions: Apply over Kerlix as directed. Electronic Signature(s) Signed: 09/01/2021 4:51:59 PM By: Worthy Keeler PA-C Signed: 09/01/2021 5:43:10 PM By: Baruch Gouty RN, BSN Entered By: Baruch Gouty on 09/01/2021 15:56:31 -------------------------------------------------------------------------------- Problem List Details Patient Name: Date of Service: Vincent Vincent [name], Vincent V ID M. 09/01/2021 3:00 PM Medical Record Number: [id number] Patient Account Number: [id number] Date of Birth/Sex: Treating RN: November 03, 1935 (86 y.o. [name] Primary Care Provider: [name] Other Clinician: Referring Provider: Treating Provider/Extender: Guadalupe Maple, Karrar Weeks in Treatment: 6 Active Problems ICD-10 Encounter Code Description Active Date MDM Diagnosis L97.818 Non-pressure chronic ulcer of other part of right lower leg with other specified 07/15/2021 No  Yes severity L97.828 Non-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 leg with other specified 07/15/2021 No Yes severity L97.522 Non-pressure chronic ulcer of other part of left foot with fat layer exposed 08/04/2021 No Yes I87.333 Chronic venous hypertension (idiopathic) with ulcer and inflammation of 07/15/2021 No Yes bilateral lower extremity E11.51 Type 2 diabetes mellitus with diabetic peripheral angiopathy without gangrene 07/15/2021 No Yes Inactive Problems Resolved Problems Electronic Signature(s) Signed: 09/01/2021 3:34:59 PM By: Worthy Keeler PA-C Entered By: Worthy Keeler on 09/01/2021 15:34:59 -------------------------------------------------------------------------------- Progress Note Details Patient Name: Date of Service: Vincent [name], Vincent V ID M. 09/01/2021 3:00 PM Medical Record Number: [id number] Patient Account Number: [id number] Date of Birth/Sex: Treating RN: 12-20-1935 (86 y.o. [name] Primary Care Provider: [name] Other Clinician: Referring Provider: Treating Provider/Extender: Guadalupe Maple, Karrar Weeks in Treatment: 6 Subjective Chief Complaint Information obtained from Patient 07/15/2021 patient is here for wounds on his bilateral lower legs History of Present Illness (HPI) ADMISSION 07/15/2021 This is an 86 year old man who came here for evaluation of wounds on his bilateral predominantly anterior lower legs mid aspect of the tibia. He seems to have been dealing with this for most of this year. Currently with 3 wounds on the right and 2 on the left. At least some of these seem to start off as blisters probably the majority. He has been following with Dr. Anabel Bene of [facility] dermatology according the patient back he has an appointment Dr. Anabel Bene tomorrow he was given a prescription for triamcinolone which she has been applying daily in fact it was renewed yesterday. Patient is a diabetic no recent hemoglobin A1c. He is on  Coumadin for persistent atrial fibrillation. Vein and vascular in December 2021 at which time he had bilateral foot pain and finger pain. ABIs bilaterally were noncompressible however the TBI on the right was only 0.39. There were biphasic waveforms on the left his ABI was noncompressible with biphasic waveforms but with a TBI of 0.6. He was not felt to have a primary vascular issue. There is a mention of Raynaud's phenomenon as well. Past medical history includes cellulitis of the right  leg in June 2022, type 2 diabetes, persistent lower extremity edema, bilateral foot and ankle pain, Raynaud's phenomenon, coronary artery disease, congestive heart failure, pacemaker, none sustained ventricular tachycardia, remote CVA paroxysmal atrial fib on Coumadin. We did not reattempt ABIs in our clinic 07/22/2019 patient appears to be doing decently well in regard to his wounds as compared to last week with the wounds that were present last week. Fortunately I do not see any signs of active infection which is great news. No fevers, chills, nausea, vomiting, or diarrhea. With that being said he has several new blisters that are getting need to be addressed today. 07/28/2021 upon evaluation today patient actually appears to be making excellent progress. I am extremely pleased with where we stand today. I think that his wounds are significantly improved compared to where they were previous. 08/04/2021 upon evaluation today patient appears to be doing much better in regard to his wounds in general. I feel like across the board he is showing signs of significant improvement. Fortunately there is no evidence of infection which is great news. No fevers, chills, nausea, vomiting, or diarrhea. 08/11/2021 upon evaluation today patient actually appears to be doing quite well in regard to his wound. Has been tolerating the dressing changes without complication. Fortunately I do not see any signs of infection currently which  is great news as well. No fevers, chills, nausea, vomiting, or diarrhea. 08/18/2021 on evaluation today patient appears to be doing excellent he is making good progress and overall I am extremely pleased with where we stand today. There does not appear to be any signs of active infection at this time which is great news. 09/01/2020 upon evaluation patient's wounds on both the left and the right appear to be at 0.1 cm measurement and are very close to complete resolution. Were not quite completely done yet but this is very close. Overall I am extremely happy with where things stand and I think he is headed in the right direction. Objective Constitutional Well-nourished and well-hydrated in no acute distress. Vitals Time Taken: 3:21 PM, Height: 69 in, Source: Stated, Weight: 185 lbs, Source: Stated, BMI: 27.3, Temperature: 98.4 F, Pulse: 91 bpm, Respiratory Rate: 18 breaths/min, Blood Pressure: 143/72 mmHg, Capillary Blood Glucose: 120 mg/dl. General Notes: glucose per pt report this am Respiratory normal breathing without difficulty. Psychiatric this patient is able to make decisions and demonstrates good insight into disease process. Alert and Oriented x 3. pleasant and cooperative. General Notes: Patient's wounds again are showing signs of being almost completely healed 0.1 is the measurement pretty much at both wound locations left and right remaining. This is excellent news. Integumentary (Hair, Skin) Wound #1 status is Open. Original cause of wound was Gradually Appeared. The date acquired was: 04/29/2021. The wound has been in treatment 6 weeks. The wound is located on the Right,Distal,Anterior Lower Leg. The wound measures 0.1cm length x 0.1cm width x 0.1cm depth; 0.008cm^2 area and 0.001cm^3 volume. There is Fat Layer (Subcutaneous Tissue) exposed. There is no tunneling or undermining noted. There is a small amount of serosanguineous drainage noted. The wound margin is flat and intact.  There is small (1-33%) red granulation within the wound bed. There is no necrotic tissue within the wound bed. Wound #9 status is Open. Original cause of wound was Gradually Appeared. The date acquired was: 08/04/2021. The wound has been in treatment 4 weeks. The wound is located on the Left,Dorsal Foot. The wound measures 0.1cm length x 0.1cm width x 0.1cm depth;  0.008cm^2 area and 0.001cm^3 volume. There is Fat Layer (Subcutaneous Tissue) exposed. There is no tunneling or undermining noted. There is a small amount of serosanguineous drainage noted. The wound margin is distinct with the outline attached to the wound base. There is small (1-33%) pink granulation within the wound bed. There is no necrotic tissue within the wound bed. Assessment Active Problems ICD-10 Non-pressure chronic ulcer of other part of right lower leg with other specified severity Non-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 leg with other specified severity Non-pressure chronic ulcer of other part of left foot with fat layer exposed Chronic venous hypertension (idiopathic) with ulcer and inflammation of bilateral lower extremity Type 2 diabetes mellitus with diabetic peripheral angiopathy without gangrene Plan Follow-up Appointments: Return Appointment in 1 week. - with Margarita Grizzle Other: - bring in compression stockings to next appointment Bathing/ Shower/ Hygiene: May shower with protection but do not get wound dressing(s) wet. Edema Control - Lymphedema / SCD / Other: Elevate legs to the level of the heart or above for 30 minutes daily and/or when sitting, a frequency of: - whenever sitting Avoid standing for long periods of time. Exercise regularly Additional Orders / Instructions: Follow Nutritious Diet - Monitor blood sugars WOUND #1: - Lower Leg Wound Laterality: Right, Anterior, Distal Peri-Wound Care: Sween Lotion (Moisturizing lotion) 1 x Per Week/15 Days Discharge Instructions: Apply moisturizing  lotion as directed Prim Dressing: KerraCel Ag Gelling Fiber Dressing, 2x2 in (silver alginate) 1 x Per Week/15 Days ary Discharge Instructions: Apply silver alginate to wound bed as instructed Secondary Dressing: Woven Gauze Sponge, Non-Sterile 4x4 in 1 x Per Week/15 Days Discharge Instructions: Apply over primary dressing as directed. Com pression Wrap: Kerlix Roll 4.5x3.1 (in/yd) 1 x Per Week/15 Days Discharge Instructions: Apply Kerlix and Coban compression as directed. Com pression Wrap: Coban Self-Adherent Wrap 4x5 (in/yd) 1 x Per Week/15 Days Discharge Instructions: Apply over Kerlix as directed. WOUND #9: - Foot Wound Laterality: Dorsal, Left Peri-Wound Care: Sween Lotion (Moisturizing lotion) 1 x Per Week/15 Days Discharge Instructions: Apply moisturizing lotion as directed Prim Dressing: KerraCel Ag Gelling Fiber Dressing, 2x2 in (silver alginate) 1 x Per Week/15 Days ary Discharge Instructions: Apply silver alginate to wound bed as instructed Secondary Dressing: Woven Gauze Sponge, Non-Sterile 4x4 in 1 x Per Week/15 Days Discharge Instructions: Apply over primary dressing as directed. Com pression Wrap: Kerlix Roll 4.5x3.1 (in/yd) 1 x Per Week/15 Days Discharge Instructions: Apply Kerlix and Coban compression as directed. Com pression Wrap: Coban Self-Adherent Wrap 4x5 (in/yd) 1 x Per Week/15 Days Discharge Instructions: Apply over Kerlix as directed. 1. I suggest currently that we continue with the wound care measures as before for 1 additional week my hope is by next week this will be completely healed the patient is very hopeful this will be the case as well. 2. I am also can recommend currently that we have the patient continue to elevate his legs much as possible although we will also continue with the compression wrapping which I think is doing a great job. 3. I am also going to suggest that he see about getting compression socks I do believe that he once he has could work  what we normally know what size they are not there appropriate. Subsequently if he gets some from elastic therapy will know that they are the correct thing he needs to keep this under control. We will see patient back for reevaluation in 1 week here in the clinic. If anything worsens or  changes patient will contact our office for additional recommendations. Electronic Signature(s) Signed: 09/01/2021 4:32:17 PM By: Worthy Keeler PA-C Entered By: Worthy Keeler on 09/01/2021 16:32:16 -------------------------------------------------------------------------------- SuperBill Details Patient Name: Date of Service: Vincent [name], Vincent V ID M. 09/01/2021 Medical Record Number: [id number] Patient Account Number: [id number] Date of Birth/Sex: Treating RN: 09-09-1935 (86 y.o. [name] Primary Care Provider: [name] Other Clinician: Referring Provider: Treating Provider/Extender: Guadalupe Maple, Karrar Weeks in Treatment: 6 Diagnosis Coding ICD-10 Codes Code Description [phone] Non-pressure chronic ulcer of other part of right lower leg with other specified severity L97.828 Non-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 leg with other specified severity L97.522 Non-pressure chronic ulcer of other part of left foot with fat layer exposed I87.333 Chronic venous hypertension (idiopathic) with ulcer and inflammation of bilateral lower extremity E11.51 Type 2 diabetes mellitus with diabetic peripheral angiopathy without gangrene Facility Procedures CPT4 Code: [id number] Description: 99214 - WOUND CARE VISIT-LEV 4 EST PT Modifier: Quantity: 1 Physician Procedures : CPT4 Code Description Modifier [id number] - WC PHYS LEVEL 3 - EST PT ICD-10 Diagnosis Description L97.818 Non-pressure chronic ulcer of other part of right lower leg with other specified severity L97.828 Non-pressure chronic ulcer of other part of  left lower leg with other specified severity L97.522 Non-pressure  chronic ulcer of other part of left foot with fat layer exposed I87.333 Chronic venous hypertension (idiopathic) with ulcer and inflammation of bilateral lower extremity Quantity: 1 Electronic Signature(s) Signed: 09/01/2021 4:32:30 PM By: Worthy Keeler PA-C Entered By: Worthy Keeler on 09/01/2021 16:32:29

## 2021-09-01 NOTE — Progress Notes (Signed)
Kluever, Immanuel M. ([id number]) Visit Report for 09/01/2021 Arrival Information Details Patient Name: Date of Service: [name] ID M. 09/01/2021 3:00 PM Medical Record Number: [id number] Patient Account Number: [id number] Date of Birth/Sex: Treating RN: Dec 24, 1935 (86 y.o. [name] Primary Care Shereda Graw: [name] Other Clinician: Referring Shedrick Sarli: Treating Sherlin Sonier/Extender: Guadalupe Maple, Karrar Weeks in Treatment: 6 Visit Information History Since Last Visit Added or deleted any medications: No Patient Arrived: Ambulatory Any new allergies or adverse reactions: No Arrival Time: 15:16 Had a fall or experienced change in No Accompanied By: self activities of daily living that may affect Transfer Assistance: None risk of falls: Patient Identification Verified: Yes Signs or symptoms of abuse/neglect since last visito No Secondary Verification Process Completed: Yes Hospitalized since last visit: No Patient Requires Transmission-Based Precautions: No Implantable device outside of the clinic excluding No Patient Has Alerts: Yes cellular tissue based products placed in the center Patient Alerts: Patient on Blood Thinner since last visit: R ABI = 1.43 TBI= .39 Has Dressing in Place as Prescribed: Yes L ABI = 1.85 TBI= .6 Has Compression in Place as Prescribed: Yes Pain Present Now: No Electronic Signature(s) Signed: 09/01/2021 5:43:10 PM By: Baruch Gouty RN, BSN Entered By: Baruch Gouty on 09/01/2021 15:18:06 -------------------------------------------------------------------------------- Clinic Level of Care Assessment Details Patient Name: Date of Service: [name], [name] 09/01/2021 3:00 PM Medical Record Number: [id number] Patient Account Number: [id number] Date of Birth/Sex: Treating RN: [birth year] (86 y.o. [name] Primary Care Burnie Therien: [name] Other Clinician: Referring Adah Stoneberg: Treating Darren Nodal/Extender: Guadalupe Maple, Karrar Weeks in Treatment: 6 Clinic Level of Care Assessment Items TOOL 4 Quantity Score _0  - 0 Use when only an EandM is performed on FOLLOW-UP visit ASSESSMENTS - Nursing Assessment / Reassessment X- 1 10 Reassessment of Co-morbidities (includes updates in patient status) X- 1 5 Reassessment of Adherence to Treatment Plan ASSESSMENTS - Wound and Skin A ssessment / Reassessment _1  - 0 Simple Wound Assessment / Reassessment - one wound X- 2 5 Complex Wound Assessment / Reassessment - multiple wounds _2  - 0 Dermatologic / Skin Assessment (not related to wound area) ASSESSMENTS - Focused Assessment X- 2 5 Circumferential Edema Measurements - multi extremities _3  - 0 Nutritional Assessment / Counseling / Intervention X- 1 5 Lower Extremity Assessment (monofilament, tuning fork, pulses) _4  - 0 Peripheral Arterial Disease Assessment (using hand held doppler) ASSESSMENTS - Ostomy and/or Continence Assessment and Care _5  - 0 Incontinence Assessment and Management _6  - 0 Ostomy Care Assessment and Management (repouching, etc.) PROCESS - Coordination of Care X - Simple Patient / Family Education for ongoing care 1 15 _7  - 0 Complex (extensive) Patient / Family Education for ongoing care X- 1 10 Staff obtains Programmer, systems, Records, T Results / Process Orders est _8  - 0 Staff telephones HHA, Nursing Homes / Clarify orders / etc _9  - 0 Routine Transfer to another Facility (non-emergent condition) _10  - 0 Routine Hospital Admission (non-emergent condition) _11  - 0 New Admissions / Biomedical engineer / Ordering NPWT Apligraf, etc. , _12  - 0 Emergency Hospital Admission (emergent condition) X- 1 10 Simple Discharge Coordination _13  - 0 Complex (extensive) Discharge Coordination PROCESS - Special Needs _14  - 0 Pediatric / Minor Patient Management _15  - 0 Isolation Patient Management _16  - 0 Hearing / Language / Visual special needs _17  - 0 Assessment of Community  assistance (transportation, D/C planning, etc.) _18  - 0 Additional assistance / Altered mentation _19  - 0 Support Surface(s)  Assessment (bed, cushion, seat, etc.) INTERVENTIONS - Wound Cleansing / Measurement _0  - 0 Simple Wound Cleansing - one wound X- 2 5 Complex Wound Cleansing - multiple wounds X- 1 5 Wound Imaging (photographs - any number of wounds) _1  - 0 Wound Tracing (instead of photographs) _2  - 0 Simple Wound Measurement - one wound X- 2 5 Complex Wound Measurement - multiple wounds INTERVENTIONS - Wound Dressings X - Small Wound Dressing one or multiple wounds 2 10 _3  - 0 Medium Wound Dressing one or multiple wounds _4  - 0 Large Wound Dressing one or multiple wounds <IEPPIRJJOACZYSAY>_3<\/KZSWFUXNATFTDDUK>_0  - 0 Application of Medications - topical <URKYHCWCBJSEGBTD>_1<\/VOHYWVPXTGGYIRSW>_5  - 0 Application of Medications - injection INTERVENTIONS - Miscellaneous _7  - 0 External ear exam _8  - 0 Specimen Collection (cultures, biopsies, blood, body fluids, etc.) _9  - 0 Specimen(s) / Culture(s) sent or taken to Lab for analysis _10  - 0 Patient Transfer (multiple staff / Civil Service fast streamer / Similar devices) _11  - 0 Simple Staple / Suture removal (25 or less) _12  - 0 Complex Staple / Suture removal (26 or more) _13  - 0 Hypo / Hyperglycemic Management (close monitor of Blood Glucose) _14  - 0 Ankle / Brachial Index (ABI) - do not check if billed separately X- 1 5 Vital Signs Has the patient been seen at the hospital within the last three years: Yes Total Score: 125 Level Of Care: New/Established - Level 4 Electronic Signature(s) Signed: 09/01/2021 5:43:10 PM By: Baruch Gouty RN, BSN Entered By: Baruch Gouty on 09/01/2021 16:11:29 -------------------------------------------------------------------------------- Lower Extremity Assessment Details Patient Name: Date of Service: [name], [name] 09/01/2021 3:00 PM Medical Record Number: [id number] Patient Account Number: [id number] Date of Birth/Sex: Treating RN: 12/12/1935 (86 y.o. [name] Primary Care Mubashir Mallek: [name] Other Clinician: Referring Leilene Diprima: Treating Aquil Duhe/Extender: Guadalupe Maple, Karrar Weeks in Treatment: 6 Edema Assessment Assessed: [Left: No] [Right: No] Edema: [Left: Yes] [Right: Yes] Calf Left: Right: Point of Measurement: From Medial Instep 39.3 cm 41.3 cm Ankle Left: Right: Point of Measurement: From Medial Instep 24 cm 22.5 cm Knee To Floor Left: Right: From Medial Instep 44 cm 44 cm Vascular Assessment Pulses: Dorsalis Pedis Palpable: [Left:No] [Right:No] Electronic Signature(s) Signed: 09/01/2021 5:43:10 PM By: Baruch Gouty RN, BSN Entered By: Baruch Gouty on 09/01/2021 15:54:54 -------------------------------------------------------------------------------- [place] Details Patient Name: Date of Service: [name], [name] 09/01/2021 3:00 PM Medical Record Number: [id number] Patient Account Number: [id number] Date of Birth/Sex: Treating RN: [birth year] (86 y.o. [name] Primary Care Marisal Swarey: [name] Other Clinician: Referring Leslee Suire: Treating Khang Hannum/Extender: Veatrice Bourbon in Treatment: [place] reviewed with physician Active Inactive Nutrition Nursing Diagnoses: Impaired glucose control: actual or potential Potential for alteratiion in Nutrition/Potential for imbalanced nutrition Goals: Patient/caregiver will maintain therapeutic glucose control Date Initiated: 07/15/2021 Target Resolution Date: 09/15/2021 Goal Status: Active Interventions: Assess HgA1c results as ordered upon admission and as needed Assess patient nutrition upon admission and as needed per policy Provide education on elevated blood sugars and impact on wound healing Treatment Activities: Dietary management education, guidance and counseling : 07/15/2021 Giving encouragement to exercise : 07/15/2021 Patient referred to Primary Care Physician for  further nutritional evaluation : 07/15/2021 Notes: 08/11/21: Glucose control ongoing Venous Leg Ulcer Nursing Diagnoses: Knowledge deficit related to disease process and management Potential for venous Insuffiency (use before diagnosis confirmed) Goals: Patient will maintain optimal edema control Date Initiated: 07/15/2021 Target Resolution Date: 09/15/2021 Goal Status: Active Interventions: Assess  peripheral edema status every visit. Compression as ordered Provide education on venous insufficiency Treatment Activities: Therapeutic compression applied : 07/15/2021 Notes: 08/11/21: Edema control ongoing Wound/Skin Impairment Nursing Diagnoses: Impaired tissue integrity Knowledge deficit related to ulceration/compromised skin integrity Goals: Patient/caregiver will verbalize understanding of skin care regimen Date Initiated: 07/15/2021 Target Resolution Date: 09/15/2021 Goal Status: Active Ulcer/skin breakdown will have a volume reduction of 30% by week 4 Date Initiated: 07/15/2021 Date Inactivated: 08/11/2021 Target Resolution Date: 08/12/2021 Goal Status: Met Ulcer/skin breakdown will have a volume reduction of 50% by week 8 Date Initiated: 08/11/2021 Target Resolution Date: 09/15/2021 Goal Status: Active Interventions: Assess patient/caregiver ability to obtain necessary supplies Assess patient/caregiver ability to perform ulcer/skin care regimen upon admission and as needed Assess ulceration(s) every visit Provide education on ulcer and skin care Treatment Activities: Skin care regimen initiated : 07/15/2021 Topical wound management initiated : 07/15/2021 Notes: Electronic Signature(s) Signed: 09/01/2021 5:43:10 PM By: Baruch Gouty RN, BSN Entered By: Baruch Gouty on 09/01/2021 15:31:22 -------------------------------------------------------------------------------- Pain Assessment Details Patient Name: Date of Service: [name], [name] 09/01/2021 3:00  PM Medical Record Number: [id number] Patient Account Number: [id number] Date of Birth/Sex: Treating RN: 08/03/1936 (86 y.o. [name] Primary Care Elishua Radford: [name] Other Clinician: Referring Chesni Vos: Treating [place] Kretz/Extender: Guadalupe Maple, Karrar Weeks in Treatment: 6 Active Problems Location of Pain Severity and Description of Pain Patient Has Paino No Site Locations Rate the pain. Current Pain Level: 0 Pain Management and Medication Current Pain Management: Electronic Signature(s) Signed: 09/01/2021 5:43:10 PM By: Baruch Gouty RN, BSN Entered By: Baruch Gouty on 09/01/2021 15:21:42 -------------------------------------------------------------------------------- Patient/Caregiver Education Details Patient Name: Date of Service: DA Burgess Estelle 1/4/2023andnbsp3:00 PM Medical Record Number: [id number] Patient Account Number: [id number] Date of Birth/Gender: Treating RN: 29-Mar-1936 (85 y.o. [name] Primary Care Physician: [name] Other Clinician: Referring Physician: Treating Physician/Extender: Guadalupe Maple, Fransisca Connors in Treatment: 6 Education Assessment Education Provided To: Patient Education Topics Provided Elevated Blood Sugar/ Impact on Healing: Methods: Explain/Verbal Responses: Reinforcements needed, State content correctly Venous: Methods: Explain/Verbal Responses: Reinforcements needed, State content correctly Electronic Signature(s) Signed: 09/01/2021 5:43:10 PM By: Baruch Gouty RN, BSN Entered By: Baruch Gouty on 09/01/2021 15:31:50 -------------------------------------------------------------------------------- Wound Assessment Details Patient Name: Date of Service: [name], [name] 09/01/2021 3:00 PM Medical Record Number: [id number] Patient Account Number: [id number] Date of Birth/Sex: Treating RN: [birth year] (86 y.o. [name] Primary Care Hommer Cunliffe: [name] Other  Clinician: Referring Mahima Hottle: Treating Jamie Hafford/Extender: Guadalupe Maple, Karrar Weeks in Treatment: 6 Wound Status Wound Number: 1 Primary Diabetic Wound/Ulcer of the Lower Extremity Etiology: Wound Location: Right, Distal, Anterior Lower Leg Secondary Venous Leg Ulcer Wounding Event: Gradually Appeared Etiology: Date Acquired: 04/29/2021 Wound Open Weeks Of Treatment: 6 Status: Clustered Wound: No Comorbid Cataracts, Glaucoma, Chronic Obstructive Pulmonary Disease History: (COPD), Arrhythmia, Congestive Heart Failure, Coronary Artery Disease, Hypertension, Type II Diabetes, Gout, Neuropathy Photos Wound Measurements Length: (cm) 0.1 Width: (cm) 0.1 Depth: (cm) 0.1 Area: (cm) 0.008 Volume: (cm) 0.001 % Reduction in Area: 99.9% % Reduction in Volume: 99.8% Epithelialization: Large (67-100%) Tunneling: No Undermining: No Wound Description Classification: Grade 1 Wound Margin: Flat and Intact Exudate Amount: Small Exudate Type: Serosanguineous Exudate Color: red, brown Wound Bed Granulation Amount: Small (1-33%) Granulation Quality: Red Necrotic Amount: None Present (0%) Exposed Structure Fascia Exposed: No Fat Layer (Subcutaneous Tissue) Exposed: Yes Tendon Exposed: No Muscle Exposed: No Joint Exposed: No Bone Exposed: No Electronic Signature(s) Signed: 09/01/2021  5:43:10 PM By: Baruch Gouty RN, BSN Entered By: Baruch Gouty on 09/01/2021 15:29:49 -------------------------------------------------------------------------------- Wound Assessment Details Patient Name: Date of Service: [name], [name] 09/01/2021 3:00 PM Medical Record Number: [id number] Patient Account Number: [id number] Date of Birth/Sex: Treating RN: 06-30-1936 (86 y.o. [name] Primary Care Reniya Mcclees: [name] Other Clinician: Referring Shanyla Marconi: Treating Corita Allinson/Extender: Guadalupe Maple, Karrar Weeks in Treatment: 6 Wound Status Wound Number: 9  Primary Diabetic Wound/Ulcer of the Lower Extremity Etiology: Wound Location: Left, Dorsal Foot Secondary Lymphedema Wounding Event: Gradually Appeared Etiology: Date Acquired: 08/04/2021 Wound Open Weeks Of Treatment: 4 Status: Clustered Wound: No Comorbid Cataracts, Glaucoma, Chronic Obstructive Pulmonary Disease History: (COPD), Arrhythmia, Congestive Heart Failure, Coronary Artery Disease, Hypertension, Type II Diabetes, Gout, Neuropathy Photos Wound Measurements Length: (cm) 0.1 Width: (cm) 0.1 Depth: (cm) 0.1 Area: (cm) 0.008 Volume: (cm) 0.001 % Reduction in Area: 91.5% % Reduction in Volume: 88.9% Epithelialization: Large (67-100%) Tunneling: No Undermining: No Wound Description Classification: Grade 2 Wound Margin: Distinct, outline attached Exudate Amount: Small Exudate Type: Serosanguineous Exudate Color: red, brown Foul Odor After Cleansing: No Slough/Fibrino No Wound Bed Granulation Amount: Small (1-33%) Exposed Structure Granulation Quality: Pink Fascia Exposed: No Necrotic Amount: None Present (0%) Fat Layer (Subcutaneous Tissue) Exposed: Yes Tendon Exposed: No Muscle Exposed: No Joint Exposed: No Bone Exposed: No Electronic Signature(s) Signed: 09/01/2021 5:43:10 PM By: Baruch Gouty RN, BSN Entered By: Baruch Gouty on 09/01/2021 15:30:26 -------------------------------------------------------------------------------- Vitals Details Patient Name: Date of Service: [name], [name] 09/01/2021 3:00 PM Medical Record Number: [id number] Patient Account Number: [id number] Date of Birth/Sex: Treating RN: [birth year] (86 y.o. [name] Primary Care Jaclyn Carew: [name] Other Clinician: Referring Verity Gilcrest: Treating Smita Lesh/Extender: Guadalupe Maple, Karrar Weeks in Treatment: 6 Vital Signs Time Taken: 15:21 Temperature (F): 98.4 Height (in): 69 Pulse (bpm): 91 Source: Stated Respiratory Rate (breaths/min): 18 Weight  (lbs): 185 Blood Pressure (mmHg): 143/72 Source: Stated Capillary Blood Glucose (mg/dl): 120 Body Mass Index (BMI): 27.3 Reference Range: 80 - 120 mg / dl Notes glucose per pt report this am Electronic Signature(s) Signed: 09/01/2021 5:43:10 PM By: Baruch Gouty RN, BSN Entered By: Baruch Gouty on 09/01/2021 15:32:14

## 2021-09-02 ENCOUNTER — Telehealth (INDEPENDENT_AMBULATORY_CARE_PROVIDER_SITE_OTHER): Payer: Medicare Other

## 2021-09-02 DIAGNOSIS — I4891 Unspecified atrial fibrillation: Secondary | ICD-10-CM

## 2021-09-02 DIAGNOSIS — Z5181 Encounter for therapeutic drug level monitoring: Secondary | ICD-10-CM

## 2021-09-02 NOTE — Telephone Encounter (Signed)
Pt overdue for INR to be checked. Called and spoke with pt. Appt scheduled for 09/06/21.

## 2021-09-06 ENCOUNTER — Ambulatory Visit (INDEPENDENT_AMBULATORY_CARE_PROVIDER_SITE_OTHER): Payer: Medicare Other

## 2021-09-06 ENCOUNTER — Other Ambulatory Visit: Payer: Self-pay

## 2021-09-06 DIAGNOSIS — E1165 Type 2 diabetes mellitus with hyperglycemia: Secondary | ICD-10-CM | POA: Diagnosis not present

## 2021-09-06 DIAGNOSIS — I10 Essential (primary) hypertension: Secondary | ICD-10-CM | POA: Diagnosis not present

## 2021-09-06 DIAGNOSIS — E1142 Type 2 diabetes mellitus with diabetic polyneuropathy: Secondary | ICD-10-CM | POA: Diagnosis not present

## 2021-09-06 DIAGNOSIS — I4821 Permanent atrial fibrillation: Secondary | ICD-10-CM | POA: Diagnosis not present

## 2021-09-06 DIAGNOSIS — I6350 Cerebral infarction due to unspecified occlusion or stenosis of unspecified cerebral artery: Secondary | ICD-10-CM | POA: Diagnosis not present

## 2021-09-06 DIAGNOSIS — E785 Hyperlipidemia, unspecified: Secondary | ICD-10-CM | POA: Diagnosis not present

## 2021-09-06 DIAGNOSIS — Z5181 Encounter for therapeutic drug level monitoring: Secondary | ICD-10-CM

## 2021-09-06 LAB — POCT INR: INR: 3.9 — AB (ref 2.0–3.0)

## 2021-09-06 NOTE — Patient Instructions (Signed)
Description   Skip today's dosage of Warfarin, then start taking 2 tablets (4mg ) daily. Recheck INR in 2 weeks. [facility] [phone].

## 2021-09-08 ENCOUNTER — Encounter (HOSPITAL_BASED_OUTPATIENT_CLINIC_OR_DEPARTMENT_OTHER): Payer: Medicare Other | Admitting: Physician Assistant

## 2021-09-08 ENCOUNTER — Other Ambulatory Visit: Payer: Self-pay

## 2021-09-08 DIAGNOSIS — I110 Hypertensive heart disease with heart failure: Secondary | ICD-10-CM | POA: Diagnosis not present

## 2021-09-08 DIAGNOSIS — I480 Paroxysmal atrial fibrillation: Secondary | ICD-10-CM | POA: Diagnosis not present

## 2021-09-08 DIAGNOSIS — I509 Heart failure, unspecified: Secondary | ICD-10-CM | POA: Diagnosis not present

## 2021-09-08 DIAGNOSIS — Z7901 Long term (current) use of anticoagulants: Secondary | ICD-10-CM | POA: Diagnosis not present

## 2021-09-08 DIAGNOSIS — Z8673 Personal history of transient ischemic attack (TIA), and cerebral infarction without residual deficits: Secondary | ICD-10-CM | POA: Diagnosis not present

## 2021-09-08 DIAGNOSIS — L97522 Non-pressure chronic ulcer of other part of left foot with fat layer exposed: Secondary | ICD-10-CM | POA: Diagnosis not present

## 2021-09-08 DIAGNOSIS — Z950 Presence of cardiac pacemaker: Secondary | ICD-10-CM | POA: Diagnosis not present

## 2021-09-08 DIAGNOSIS — L97828 Non-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 leg with other specified severity: Secondary | ICD-10-CM | POA: Diagnosis not present

## 2021-09-08 DIAGNOSIS — I7300 Raynaud's syndrome without gangrene: Secondary | ICD-10-CM | POA: Diagnosis not present

## 2021-09-08 DIAGNOSIS — E1151 Type 2 diabetes mellitus with diabetic peripheral angiopathy without gangrene: Secondary | ICD-10-CM | POA: Diagnosis not present

## 2021-09-08 DIAGNOSIS — I2510 Atherosclerotic heart disease of native coronary artery without angina pectoris: Secondary | ICD-10-CM | POA: Diagnosis not present

## 2021-09-08 DIAGNOSIS — I872 Venous insufficiency (chronic) (peripheral): Secondary | ICD-10-CM | POA: Diagnosis not present

## 2021-09-08 DIAGNOSIS — I87333 Chronic venous hypertension (idiopathic) with ulcer and inflammation of bilateral lower extremity: Secondary | ICD-10-CM | POA: Diagnosis not present

## 2021-09-08 DIAGNOSIS — L97818 Non-pressure chronic ulcer of other part of right lower leg with other specified severity: Secondary | ICD-10-CM | POA: Diagnosis not present

## 2021-09-08 NOTE — Progress Notes (Addendum)
Vincent [name], Vincent M. ([id number]) Visit Report for 09/08/2021 Chief Complaint Document Details Patient Name: Date of Service: [name] ID M. 09/08/2021 2:00 PM Medical Record Number: [id number] Patient Account Number: [id number] Date of Birth/Sex: Treating RN: September 03, 1935 (86 y.o. [name] Primary Care Provider: [name] Other Clinician: Referring Provider: Treating Provider/Extender: Guadalupe Maple, Karrar Weeks in Treatment: 7 Information Obtained from: Patient Chief Complaint 07/15/2021 patient is here for wounds on his bilateral lower legs Electronic Signature(s) Signed: 09/08/2021 2:18:42 PM By: Worthy Keeler PA-C Entered By: Worthy Keeler on 09/08/2021 14:18:42 -------------------------------------------------------------------------------- HPI Details Patient Name: Date of Service: [name], [name] 09/08/2021 2:00 PM Medical Record Number: [id number] Patient Account Number: [id number] Date of Birth/Sex: Treating RN: [birth year] (86 y.o. [name] Primary Care Provider: [name] Other Clinician: Referring Provider: Treating Provider/Extender: Guadalupe Maple, Karrar Weeks in Treatment: 7 History of Present Illness HPI Description: ADMISSION 07/15/2021 This is an 86 year old man who came here for evaluation of wounds on his bilateral predominantly anterior lower legs mid aspect of the tibia. He seems to have been dealing with this for most of this year. Currently with 3 wounds on the right and 2 on the left. At least some of these seem to start off as blisters probably the majority. He has been following with Dr. Anabel Bene of [facility] dermatology according the patient back he has an appointment Dr. Anabel Bene tomorrow he was given a prescription for triamcinolone which she has been applying daily in fact it was renewed yesterday. Patient is a diabetic no recent hemoglobin A1c. He is on Coumadin for persistent atrial fibrillation. Vein  and vascular in December 2021 at which time he had bilateral foot pain and finger pain. ABIs bilaterally were noncompressible however the TBI on the right was only 0.39. There were biphasic waveforms on the left his ABI was noncompressible with biphasic waveforms but with a TBI of 0.6. He was not felt to have a primary vascular issue. There is a mention of Raynaud's phenomenon as well. Past medical history includes cellulitis of the right leg in June 2022, type 2 diabetes, persistent lower extremity edema, bilateral foot and ankle pain, Raynaud's phenomenon, coronary artery disease, congestive heart failure, pacemaker, none sustained ventricular tachycardia, remote CVA paroxysmal atrial fib on Coumadin. We did not reattempt ABIs in our clinic 07/22/2019 patient appears to be doing decently well in regard to his wounds as compared to last week with the wounds that were present last week. Fortunately I do not see any signs of active infection which is great news. No fevers, chills, nausea, vomiting, or diarrhea. With that being said he has several new blisters that are getting need to be addressed today. 07/28/2021 upon evaluation today patient actually appears to be making excellent progress. I am extremely pleased with where we stand today. I think that his wounds are significantly improved compared to where they were previous. 08/04/2021 upon evaluation today patient appears to be doing much better in regard to his wounds in general. I feel like across the board he is showing signs of significant improvement. Fortunately there is no evidence of infection which is great news. No fevers, chills, nausea, vomiting, or diarrhea. 08/11/2021 upon evaluation today patient actually appears to be doing quite well in regard to his wound. Has been tolerating the dressing changes without complication. Fortunately I do not see any signs of infection currently which is great news as well. No fevers, chills, nausea,  vomiting, or diarrhea. 08/18/2021 on evaluation today patient appears to be doing excellent he is making good progress and overall I am extremely pleased with where we stand today. There does not appear to be any signs of active infection at this time which is great news. 09/01/2020 upon evaluation patient's wounds on both the left and the right appear to be at 0.1 cm measurement and are very close to complete resolution. Were not quite completely done yet but this is very close. Overall I am extremely happy with where things stand and I think he is headed in the right direction. 09/08/2021 upon evaluation today patient appears to be doing well in regard to his right leg which is showing signs of being completely healed which is excellent. Fortunately I do not see any evidence of active infection locally nor systemically at this point which is also great news. No fevers, chills, nausea, vomiting, or diarrhea. Electronic Signature(s) Signed: 09/08/2021 3:00:17 PM By: Worthy Keeler PA-C Entered By: Worthy Keeler on 09/08/2021 15:00:17 -------------------------------------------------------------------------------- Physical Exam Details Patient Name: Date of Service: [name], [name] 09/08/2021 2:00 PM Medical Record Number: [id number] Patient Account Number: [id number] Date of Birth/Sex: Treating RN: Jul 17, 1936 (86 y.o. [name] Primary Care Provider: [name] Other Clinician: Referring Provider: Treating Provider/Extender: Guadalupe Maple, Karrar Weeks in Treatment: 7 Constitutional Well-nourished and well-hydrated in no acute distress. Respiratory normal breathing without difficulty. Psychiatric this patient is able to make decisions and demonstrates good insight into disease process. Alert and Oriented x 3. pleasant and cooperative. Notes Patient's wound bed showed signs again on the right leg and being completely healed on the left foot this is not completely  healed but does appear to be close it were still going to continue to wrap the foot since this is not done yet on the right leg I think we can definitely move to having him just use his own compression stocking. Electronic Signature(s) Signed: 09/08/2021 3:00:40 PM By: Worthy Keeler PA-C Entered By: Worthy Keeler on 09/08/2021 15:00:40 -------------------------------------------------------------------------------- Physician Orders Details Patient Name: Date of Service: [name], [name] 09/08/2021 2:00 PM Medical Record Number: [id number] Patient Account Number: [id number] Date of Birth/Sex: Treating RN: [birth year] (86 y.o. [name] Primary Care Provider: [name] Other Clinician: Referring Provider: Treating Provider/Extender: Guadalupe Maple, Karrar Weeks in Treatment: 7 Verbal / Phone Orders: No Diagnosis Coding ICD-10 Coding Code Description [phone] Non-pressure chronic ulcer of other part of right lower leg with other specified severity L97.828 Non-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 leg with other specified severity L97.522 Non-pressure chronic ulcer of other part of left foot with fat layer exposed I87.333 Chronic venous hypertension (idiopathic) with ulcer and inflammation of bilateral lower extremity E11.51 Type 2 diabetes mellitus with diabetic peripheral angiopathy without gangrene Follow-up Appointments ppointment in 1 week. - with Margarita Grizzle Return A Bathing/ Shower/ Hygiene May shower with protection but do not get wound dressing(s) wet. Edema Control - Lymphedema / SCD / Other Bilateral Lower Extremities Elevate legs to the level of the heart or above for 30 minutes daily and/or when sitting, a frequency of: - whenever sitting Avoid standing for long periods of time. Patient to wear own compression stockings every day. - Compression Stocking to right leg: apply in morning and remove at bedtime. Exercise regularly Moisturize legs  daily. - Right Leg Additional Orders / Instructions Follow Nutritious Diet - Monitor blood sugars Wound Treatment  Wound #9 - Foot Wound Laterality: Dorsal, Left Peri-Wound Care: Sween Lotion (Moisturizing lotion) 1 x Per Week/15 Days Discharge Instructions: Apply moisturizing lotion as directed Prim Dressing: KerraCel Ag Gelling Fiber Dressing, 2x2 in (silver alginate) 1 x Per Week/15 Days ary Discharge Instructions: Apply silver alginate to wound bed as instructed Secondary Dressing: Woven Gauze Sponge, Non-Sterile 4x4 in 1 x Per Week/15 Days Discharge Instructions: Apply over primary dressing as directed. Compression Wrap: Kerlix Roll 4.5x3.1 (in/yd) 1 x Per Week/15 Days Discharge Instructions: Apply Kerlix and Coban compression as directed. Compression Wrap: Coban Self-Adherent Wrap 4x5 (in/yd) 1 x Per Week/15 Days Discharge Instructions: Apply over Kerlix as directed. Electronic Signature(s) Signed: 09/08/2021 4:38:38 PM By: Lorrin Jackson Signed: 09/08/2021 5:05:22 PM By: Worthy Keeler PA-C Entered By: Lorrin Jackson on 09/08/2021 14:56:12 -------------------------------------------------------------------------------- Problem List Details Patient Name: Date of Service: [name], [name] 09/08/2021 2:00 PM Medical Record Number: [id number] Patient Account Number: [id number] Date of Birth/Sex: Treating RN: 01/03/1936 (86 y.o. [name] Primary Care Provider: [name] Other Clinician: Referring Provider: Treating Provider/Extender: Guadalupe Maple, Karrar Weeks in Treatment: 7 Active Problems ICD-10 Encounter Code Description Active Date MDM Diagnosis L97.818 Non-pressure chronic ulcer of other part of right lower leg with other specified 07/15/2021 No Yes severity L97.828 Non-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 leg with other specified 07/15/2021 No Yes severity L97.522 Non-pressure chronic ulcer of other part of left foot with fat  layer exposed 08/04/2021 No Yes I87.333 Chronic venous hypertension (idiopathic) with ulcer and inflammation of 07/15/2021 No Yes bilateral lower extremity E11.51 Type 2 diabetes mellitus with diabetic peripheral angiopathy without gangrene 07/15/2021 No Yes Inactive Problems Resolved Problems Electronic Signature(s) Signed: 09/08/2021 2:18:31 PM By: Worthy Keeler PA-C Entered By: Worthy Keeler on 09/08/2021 14:18:31 -------------------------------------------------------------------------------- Progress Note Details Patient Name: Date of Service: [name], [name] 09/08/2021 2:00 PM Medical Record Number: [id number] Patient Account Number: [id number] Date of Birth/Sex: Treating RN: 02-16-1936 (86 y.o. [name] Primary Care Provider: [name] Other Clinician: Referring Provider: Treating Provider/Extender: Guadalupe Maple, Karrar Weeks in Treatment: 7 Subjective Chief Complaint Information obtained from Patient 07/15/2021 patient is here for wounds on his bilateral lower legs History of Present Illness (HPI) ADMISSION 07/15/2021 This is an 86 year old man who came here for evaluation of wounds on his bilateral predominantly anterior lower legs mid aspect of the tibia. He seems to have been dealing with this for most of this year. Currently with 3 wounds on the right and 2 on the left. At least some of these seem to start off as blisters probably the majority. He has been following with Dr. Anabel Bene of [facility] dermatology according the patient back he has an appointment Dr. Anabel Bene tomorrow he was given a prescription for triamcinolone which she has been applying daily in fact it was renewed yesterday. Patient is a diabetic no recent hemoglobin A1c. He is on Coumadin for persistent atrial fibrillation. Vein and vascular in December 2021 at which time he had bilateral foot pain and finger pain. ABIs bilaterally were noncompressible however the TBI on the  right was only 0.39. There were biphasic waveforms on the left his ABI was noncompressible with biphasic waveforms but with a TBI of 0.6. He was not felt to have a primary vascular issue. There is a mention of Raynaud's phenomenon as well. Past medical history includes cellulitis of the right leg in June 2022, type 2 diabetes, persistent lower  extremity edema, bilateral foot and ankle pain, Raynaud's phenomenon, coronary artery disease, congestive heart failure, pacemaker, none sustained ventricular tachycardia, remote CVA paroxysmal atrial fib on Coumadin. We did not reattempt ABIs in our clinic 07/22/2019 patient appears to be doing decently well in regard to his wounds as compared to last week with the wounds that were present last week. Fortunately I do not see any signs of active infection which is great news. No fevers, chills, nausea, vomiting, or diarrhea. With that being said he has several new blisters that are getting need to be addressed today. 07/28/2021 upon evaluation today patient actually appears to be making excellent progress. I am extremely pleased with where we stand today. I think that his wounds are significantly improved compared to where they were previous. 08/04/2021 upon evaluation today patient appears to be doing much better in regard to his wounds in general. I feel like across the board he is showing signs of significant improvement. Fortunately there is no evidence of infection which is great news. No fevers, chills, nausea, vomiting, or diarrhea. 08/11/2021 upon evaluation today patient actually appears to be doing quite well in regard to his wound. Has been tolerating the dressing changes without complication. Fortunately I do not see any signs of infection currently which is great news as well. No fevers, chills, nausea, vomiting, or diarrhea. 08/18/2021 on evaluation today patient appears to be doing excellent he is making good progress and overall I am extremely  pleased with where we stand today. There does not appear to be any signs of active infection at this time which is great news. 09/01/2020 upon evaluation patient's wounds on both the left and the right appear to be at 0.1 cm measurement and are very close to complete resolution. Were not quite completely done yet but this is very close. Overall I am extremely happy with where things stand and I think he is headed in the right direction. 09/08/2021 upon evaluation today patient appears to be doing well in regard to his right leg which is showing signs of being completely healed which is excellent. Fortunately I do not see any evidence of active infection locally nor systemically at this point which is also great news. No fevers, chills, nausea, vomiting, or diarrhea. Objective Constitutional Well-nourished and well-hydrated in no acute distress. Vitals Time Taken: 2:08 PM, Height: 69 in, Weight: 185 lbs, BMI: 27.3, Temperature: 97.5 F, Pulse: 105 bpm, Respiratory Rate: 18 breaths/min, Blood Pressure: 119/77 mmHg, Capillary Blood Glucose: 120 mg/dl. Respiratory normal breathing without difficulty. Psychiatric this patient is able to make decisions and demonstrates good insight into disease process. Alert and Oriented x 3. pleasant and cooperative. General Notes: Patient's wound bed showed signs again on the right leg and being completely healed on the left foot this is not completely healed but does appear to be close it were still going to continue to wrap the foot since this is not done yet on the right leg I think we can definitely move to having him just use his own compression stocking. Integumentary (Hair, Skin) Wound #1 status is Healed - Epithelialized. Original cause of wound was Gradually Appeared. The date acquired was: 04/29/2021. The wound has been in treatment 7 weeks. The wound is located on the Right,Distal,Anterior Lower Leg. The wound measures 0cm length x 0cm width x 0cm depth;  0cm^2 area and 0cm^3 volume. Wound #9 status is Open. Original cause of wound was Gradually Appeared. The date acquired was: 08/04/2021. The wound has been in  treatment 5 weeks. The wound is located on the Left,Dorsal Foot. The wound measures 0.4cm length x 0.3cm width x 0.1cm depth; 0.094cm^2 area and 0.009cm^3 volume. There is Fat Layer (Subcutaneous Tissue) exposed. There is no tunneling or undermining noted. There is a small amount of serosanguineous drainage noted. The wound margin is distinct with the outline attached to the wound base. There is large (67-100%) red granulation within the wound bed. There is a small (1-33%) amount of necrotic tissue within the wound bed including Adherent Slough. Assessment Active Problems ICD-10 Non-pressure chronic ulcer of other part of right lower leg with other specified severity Non-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 leg with other specified severity Non-pressure chronic ulcer of other part of left foot with fat layer exposed Chronic venous hypertension (idiopathic) with ulcer and inflammation of bilateral lower extremity Type 2 diabetes mellitus with diabetic peripheral angiopathy without gangrene Plan Follow-up Appointments: Return Appointment in 1 week. - with Glynn Octave Shower/ Hygiene: May shower with protection but do not get wound dressing(s) wet. Edema Control - Lymphedema / SCD / Other: Elevate legs to the level of the heart or above for 30 minutes daily and/or when sitting, a frequency of: - whenever sitting Avoid standing for long periods of time. Patient to wear own compression stockings every day. - Compression Stocking to right leg: apply in morning and remove at bedtime. Exercise regularly Moisturize legs daily. - Right Leg Additional Orders / Instructions: Follow Nutritious Diet - Monitor blood sugars WOUND #9: - Foot Wound Laterality: Dorsal, Left Peri-Wound Care: Sween Lotion (Moisturizing lotion) 1 x Per  Week/15 Days Discharge Instructions: Apply moisturizing lotion as directed Prim Dressing: KerraCel Ag Gelling Fiber Dressing, 2x2 in (silver alginate) 1 x Per Week/15 Days ary Discharge Instructions: Apply silver alginate to wound bed as instructed Secondary Dressing: Woven Gauze Sponge, Non-Sterile 4x4 in 1 x Per Week/15 Days Discharge Instructions: Apply over primary dressing as directed. Compression Wrap: Kerlix Roll 4.5x3.1 (in/yd) 1 x Per Week/15 Days Discharge Instructions: Apply Kerlix and Coban compression as directed. Compression Wrap: Coban Self-Adherent Wrap 4x5 (in/yd) 1 x Per Week/15 Days Discharge Instructions: Apply over Kerlix as directed. 1. I am good recommend currently that we going continue with the wound care measures as before the patient is in agreement the plan. This includes the use of the silver alginate dressing on the left foot which is doing a good job. 2. I am also can recommend we have the patient continue with the Curlex and Coban wrap which I think is also doing well for the left side. 3. I would recommend that he use the compression stocking that he has for the right leg which is completely healed I think that is absolutely okay to do. We will see patient back for reevaluation in 1 week here in the clinic. If anything worsens or changes patient will contact our office for additional recommendations. Electronic Signature(s) Signed: 09/08/2021 3:01:36 PM By: Worthy Keeler PA-C Entered By: Worthy Keeler on 09/08/2021 15:01:36 -------------------------------------------------------------------------------- SuperBill Details Patient Name: Date of Service: [name], [name] 09/08/2021 Medical Record Number: [id number] Patient Account Number: [id number] Date of Birth/Sex: Treating RN: 1936-07-26 (86 y.o. [name] Primary Care Provider: [name] Other Clinician: Referring Provider: Treating Provider/Extender: Guadalupe Maple,  Karrar Weeks in Treatment: 7 Diagnosis Coding ICD-10 Codes Code Description [phone] Non-pressure chronic ulcer of other part of right lower leg with other specified severity L97.828 Non-pressure chronic ulcer of  other part of left lower leg with other specified severity L97.522 Non-pressure chronic ulcer of other part of left foot with fat layer exposed I87.333 Chronic venous hypertension (idiopathic) with ulcer and inflammation of bilateral lower extremity E11.51 Type 2 diabetes mellitus with diabetic peripheral angiopathy without gangrene Facility Procedures CPT4 Code: [id number] Description: 99213 - WOUND CARE VISIT-LEV 3 EST PT Modifier: Quantity: 1 Physician Procedures : CPT4 Code Description Modifier [id number] - WC PHYS LEVEL 3 - EST PT ICD-10 Diagnosis Description L97.818 Non-pressure chronic ulcer of other part of right lower leg with other specified severity L97.828 Non-pressure chronic ulcer of other part of  left lower leg with other specified severity L97.522 Non-pressure chronic ulcer of other part of left foot with fat layer exposed I87.333 Chronic venous hypertension (idiopathic) with ulcer and inflammation of bilateral lower extremity Quantity: 1 Electronic Signature(s) Signed: 09/08/2021 3:01:58 PM By: Worthy Keeler PA-C Entered By: Worthy Keeler on 09/08/2021 15:01:57

## 2021-09-09 NOTE — Progress Notes (Signed)
Shiffer, Zaviyar M. ([id number]) Visit Report for 09/08/2021 Arrival Information Details Patient Name: Date of Service: [name] ID M. 09/08/2021 2:00 PM Medical Record Number: [id number] Patient Account Number: [id number] Date of Birth/Sex: Treating RN: March 22, 1936 (86 y.o. [name] Primary Care Corine Solorio: [name] Other Clinician: Referring Cierria Height: Treating Taeja Debellis/Extender: Guadalupe Maple, Karrar Weeks in Treatment: 7 Visit Information History Since Last Visit Added or deleted any medications: No Patient Arrived: Ambulatory Any new allergies or adverse reactions: No Arrival Time: 14:05 Had a fall or experienced change in No Accompanied By: self activities of daily living that may affect Transfer Assistance: Manual risk of falls: Patient Identification Verified: Yes Signs or symptoms of abuse/neglect since last visito No Secondary Verification Process Completed: Yes Hospitalized since last visit: No Patient Requires Transmission-Based Precautions: No Implantable device outside of the clinic excluding No Patient Has Alerts: Yes cellular tissue based products placed in the center Patient Alerts: Patient on Blood Thinner since last visit: R ABI = 1.43 TBI= .39 Has Dressing in Place as Prescribed: Yes L ABI = 1.85 TBI= .6 Pain Present Now: No Electronic Signature(s) Signed: 09/09/2021 10:42:23 AM By: Sandre Kitty Entered By: Sandre Kitty on 09/08/2021 14:05:29 -------------------------------------------------------------------------------- Clinic Level of Care Assessment Details Patient Name: Date of Service: [name], Shaune Pascal ID M. 09/08/2021 2:00 PM Medical Record Number: [id number] Patient Account Number: [id number] Date of Birth/Sex: Treating RN: 14-Oct-1935 (86 y.o. [name] Primary Care Shykeria Sakamoto: [name] Other Clinician: Referring Gaylia Kassel: Treating Averleigh Savary/Extender: Guadalupe Maple, Karrar Weeks in Treatment: 7 Clinic  Level of Care Assessment Items TOOL 4 Quantity Score X- 1 0 Use when only an EandM is performed on FOLLOW-UP visit ASSESSMENTS - Nursing Assessment / Reassessment X- 1 10 Reassessment of Co-morbidities (includes updates in patient status) X- 1 5 Reassessment of Adherence to Treatment Plan ASSESSMENTS - Wound and Skin A ssessment / Reassessment '[]'  - 0 Simple Wound Assessment / Reassessment - one wound X- 2 5 Complex Wound Assessment / Reassessment - multiple wounds '[]'  - 0 Dermatologic / Skin Assessment (not related to wound area) ASSESSMENTS - Focused Assessment '[]'  - 0 Circumferential Edema Measurements - multi extremities '[]'  - 0 Nutritional Assessment / Counseling / Intervention '[]'  - 0 Lower Extremity Assessment (monofilament, tuning fork, pulses) '[]'  - 0 Peripheral Arterial Disease Assessment (using hand held doppler) ASSESSMENTS - Ostomy and/or Continence Assessment and Care '[]'  - 0 Incontinence Assessment and Management '[]'  - 0 Ostomy Care Assessment and Management (repouching, etc.) PROCESS - Coordination of Care '[]'  - 0 Simple Patient / Family Education for ongoing care X- 1 20 Complex (extensive) Patient / Family Education for ongoing care '[]'  - 0 Staff obtains Programmer, systems, Records, T Results / Process Orders est '[]'  - 0 Staff telephones HHA, Nursing Homes / Clarify orders / etc '[]'  - 0 Routine Transfer to another Facility (non-emergent condition) '[]'  - 0 Routine Hospital Admission (non-emergent condition) '[]'  - 0 New Admissions / Biomedical engineer / Ordering NPWT Apligraf, etc. , '[]'  - 0 Emergency Hospital Admission (emergent condition) '[]'  - 0 Simple Discharge Coordination '[]'  - 0 Complex (extensive) Discharge Coordination PROCESS - Special Needs '[]'  - 0 Pediatric / Minor Patient Management '[]'  - 0 Isolation Patient Management '[]'  - 0 Hearing / Language / Visual special needs '[]'  - 0 Assessment of Community assistance (transportation, D/C planning, etc.) '[]'  -  0 Additional assistance / Altered mentation '[]'  - 0 Support Surface(s) Assessment (bed, cushion, seat, etc.) INTERVENTIONS - Wound Cleansing /  Measurement X - Simple Wound Cleansing - one wound 1 5 '[]'  - 0 Complex Wound Cleansing - multiple wounds X- 1 5 Wound Imaging (photographs - any number of wounds) '[]'  - 0 Wound Tracing (instead of photographs) X- 1 5 Simple Wound Measurement - one wound '[]'  - 0 Complex Wound Measurement - multiple wounds INTERVENTIONS - Wound Dressings '[]'  - 0 Small Wound Dressing one or multiple wounds '[]'  - 0 Medium Wound Dressing one or multiple wounds X- 1 20 Large Wound Dressing one or multiple wounds '[]'  - 0 Application of Medications - topical '[]'  - 0 Application of Medications - injection INTERVENTIONS - Miscellaneous '[]'  - 0 External ear exam '[]'  - 0 Specimen Collection (cultures, biopsies, blood, body fluids, etc.) '[]'  - 0 Specimen(s) / Culture(s) sent or taken to Lab for analysis '[]'  - 0 Patient Transfer (multiple staff / Civil Service fast streamer / Similar devices) '[]'  - 0 Simple Staple / Suture removal (25 or less) '[]'  - 0 Complex Staple / Suture removal (26 or more) '[]'  - 0 Hypo / Hyperglycemic Management (close monitor of Blood Glucose) '[]'  - 0 Ankle / Brachial Index (ABI) - do not check if billed separately X- 1 5 Vital Signs Has the patient been seen at the hospital within the last three years: Yes Total Score: 85 Level Of Care: New/Established - Level 3 Electronic Signature(s) Signed: 09/08/2021 4:38:38 PM By: Lorrin Jackson Entered By: Lorrin Jackson on 09/08/2021 14:56:57 -------------------------------------------------------------------------------- Encounter Discharge Information Details Patient Name: Date of Service: [name], [name] 09/08/2021 2:00 PM Medical Record Number: [id number] Patient Account Number: [id number] Date of Birth/Sex: Treating RN: [birth year] (86 y.o. [name] Primary Care Kristain Filo: [name] Other  Clinician: Referring Betta Balla: Treating Elverna Caffee/Extender: Guadalupe Maple, Karrar Weeks in Treatment: 7 Encounter Discharge Information Items Discharge Condition: Stable Ambulatory Status: Ambulatory Discharge Destination: Home Transportation: Private Auto Schedule Follow-up Appointment: Yes Clinical Summary of Care: Provided on 09/08/2021 Form Type Recipient Paper Patient Patient Electronic Signature(s) Signed: 09/08/2021 3:36:47 PM By: Lorrin Jackson Entered By: Lorrin Jackson on 09/08/2021 15:36:47 -------------------------------------------------------------------------------- Lower Extremity Assessment Details Patient Name: Date of Service: [name], [name] 09/08/2021 2:00 PM Medical Record Number: [id number] Patient Account Number: [id number] Date of Birth/Sex: Treating RN: 1936/07/23 (86 y.o. [name] Primary Care Tora Prunty: [name] Other Clinician: Referring Kadia Abaya: Treating Avanthika Dehnert/Extender: Guadalupe Maple, Karrar Weeks in Treatment: 7 Edema Assessment Assessed: Shirlyn Goltz: Yes] Patrice Paradise: Yes] Edema: [Left: No] [Right: No] Calf Left: Right: Point of Measurement: From Medial Instep 39 cm 40.5 cm Ankle Left: Right: Point of Measurement: From Medial Instep 24 cm 22 cm Vascular Assessment Pulses: Dorsalis Pedis Palpable: [Left:Yes] [Right:Yes] Electronic Signature(s) Signed: 09/08/2021 4:38:38 PM By: Lorrin Jackson Entered By: Lorrin Jackson on 09/08/2021 14:47:15 -------------------------------------------------------------------------------- Multi-Disciplinary Care Plan Details Patient Name: Date of Service: [name], [name] 09/08/2021 2:00 PM Medical Record Number: [id number] Patient Account Number: [id number] Date of Birth/Sex: Treating RN: 24-Mar-1936 (86 y.o. [name] Primary Care Aishi Courts: [name] Other Clinician: Referring Kyrillos Adams: Treating Lyal Husted/Extender: Veatrice Bourbon in  Treatment: 7 Multidisciplinary Care Plan reviewed with physician Active Inactive Nutrition Nursing Diagnoses: Impaired glucose control: actual or potential Potential for alteratiion in Nutrition/Potential for imbalanced nutrition Goals: Patient/caregiver will maintain therapeutic glucose control Date Initiated: 07/15/2021 Target Resolution Date: 09/15/2021 Goal Status: Active Interventions: Assess HgA1c results as ordered upon admission and as needed Assess patient nutrition upon admission and as needed per policy Provide education on  elevated blood sugars and impact on wound healing Treatment Activities: Dietary management education, guidance and counseling : 07/15/2021 Giving encouragement to exercise : 07/15/2021 Patient referred to Primary Care Physician for further nutritional evaluation : 07/15/2021 Notes: 08/11/21: Glucose control ongoing Venous Leg Ulcer Nursing Diagnoses: Knowledge deficit related to disease process and management Potential for venous Insuffiency (use before diagnosis confirmed) Goals: Patient will maintain optimal edema control Date Initiated: 07/15/2021 Target Resolution Date: 09/15/2021 Goal Status: Active Interventions: Assess peripheral edema status every visit. Compression as ordered Provide education on venous insufficiency Treatment Activities: Therapeutic compression applied : 07/15/2021 Notes: 08/11/21: Edema control ongoing Wound/Skin Impairment Nursing Diagnoses: Impaired tissue integrity Knowledge deficit related to ulceration/compromised skin integrity Goals: Patient/caregiver will verbalize understanding of skin care regimen Date Initiated: 07/15/2021 Target Resolution Date: 09/15/2021 Goal Status: Active Ulcer/skin breakdown will have a volume reduction of 30% by week 4 Date Initiated: 07/15/2021 Date Inactivated: 08/11/2021 Target Resolution Date: 08/12/2021 Goal Status: Met Ulcer/skin breakdown will have a volume reduction  of 50% by week 8 Date Initiated: 08/11/2021 Target Resolution Date: 09/15/2021 Goal Status: Active Interventions: Assess patient/caregiver ability to obtain necessary supplies Assess patient/caregiver ability to perform ulcer/skin care regimen upon admission and as needed Assess ulceration(s) every visit Provide education on ulcer and skin care Treatment Activities: Skin care regimen initiated : 07/15/2021 Topical wound management initiated : 07/15/2021 Notes: Electronic Signature(s) Signed: 09/08/2021 4:38:38 PM By: Lorrin Jackson Entered By: Lorrin Jackson on 09/08/2021 14:49:25 -------------------------------------------------------------------------------- Pain Assessment Details Patient Name: Date of Service: [name], [name] 09/08/2021 2:00 PM Medical Record Number: [id number] Patient Account Number: [id number] Date of Birth/Sex: Treating RN: 12/01/1935 (86 y.o. [name] Primary Care Mykal Batiz: [name] Other Clinician: Referring Aletta Edmunds: Treating Lanyah Spengler/Extender: Guadalupe Maple, Karrar Weeks in Treatment: 7 Active Problems Location of Pain Severity and Description of Pain Patient Has Paino No Site Locations Pain Management and Medication Current Pain Management: Electronic Signature(s) Signed: 09/08/2021 5:54:22 PM By: Baruch Gouty RN, BSN Signed: 09/09/2021 10:42:23 AM By: Sandre Kitty Entered By: Sandre Kitty on 09/08/2021 14:09:05 -------------------------------------------------------------------------------- Patient/Caregiver Education Details Patient Name: Date of Service: [name] ID M. 1/11/2023andnbsp2:00 PM Medical Record Number: [id number] Patient Account Number: [id number] Date of Birth/Gender: Treating RN: Jan 22, 1936 (86 y.o. [name] Primary Care Physician: [name] Other Clinician: Referring Physician: Treating Physician/Extender: Veatrice Bourbon in Treatment:  7 Education Assessment Education Provided To: Patient Education Topics Provided Elevated Blood Sugar/ Impact on Healing: Methods: Explain/Verbal, Printed Responses: State content correctly Wound/Skin Impairment: Methods: Explain/Verbal, Printed Responses: State content correctly Electronic Signature(s) Signed: 09/08/2021 4:38:38 PM By: Lorrin Jackson Entered By: Lorrin Jackson on 09/08/2021 14:49:46 -------------------------------------------------------------------------------- Wound Assessment Details Patient Name: Date of Service: [name], [name] 09/08/2021 2:00 PM Medical Record Number: [id number] Patient Account Number: [id number] Date of Birth/Sex: Treating RN: August 30, 1935 (86 y.o. [name] Primary Care Nariah Morgano: [name] Other Clinician: Referring Demir Titsworth: Treating Rasheeda Mulvehill/Extender: Guadalupe Maple, Karrar Weeks in Treatment: 7 Wound Status Wound Number: 1 Primary Diabetic Wound/Ulcer of the Lower Extremity Etiology: Wound Location: Right, Distal, Anterior Lower Leg Secondary Venous Leg Ulcer Wounding Event: Gradually Appeared Etiology: Date Acquired: 04/29/2021 Wound Healed - Epithelialized Weeks Of Treatment: 7 Status: Clustered Wound: No Comorbid Cataracts, Glaucoma, Chronic Obstructive Pulmonary Disease History: (COPD), Arrhythmia, Congestive Heart Failure, Coronary Artery Disease, Hypertension, Type II Diabetes, Gout, Neuropathy Photos Wound Measurements Length: (cm) Width: (cm) Depth: (cm) Area: (cm) Volume: (cm) 0 % Reduction in Area:  100% 0 % Reduction in Volume: 100% 0 0 0 Wound Description Classification: Grade 1 Electronic Signature(s) Signed: 09/08/2021 4:38:38 PM By: Lorrin Jackson Signed: 09/08/2021 5:54:22 PM By: Baruch Gouty RN, BSN Entered By: Lorrin Jackson on 09/08/2021 14:49:02 -------------------------------------------------------------------------------- Wound Assessment Details Patient Name: Date  of Service: [name], [name] 09/08/2021 2:00 PM Medical Record Number: [id number] Patient Account Number: [id number] Date of Birth/Sex: Treating RN: 12/18/1935 (86 y.o. [name] Primary Care Lenora Gomes: [name] Other Clinician: Referring Bracy Pepper: Treating Deundra Furber/Extender: Guadalupe Maple, Karrar Weeks in Treatment: 7 Wound Status Wound Number: 9 Primary Diabetic Wound/Ulcer of the Lower Extremity Etiology: Wound Location: Left, Dorsal Foot Secondary Lymphedema Wounding Event: Gradually Appeared Etiology: Date Acquired: 08/04/2021 Wound Open Weeks Of Treatment: 5 Status: Clustered Wound: No Comorbid Cataracts, Glaucoma, Chronic Obstructive Pulmonary Disease History: (COPD), Arrhythmia, Congestive Heart Failure, Coronary Artery Disease, Hypertension, Type II Diabetes, Gout, Neuropathy Photos Wound Measurements Length: (cm) 0.4 Width: (cm) 0.3 Depth: (cm) 0.1 Area: (cm) 0.094 Volume: (cm) 0.009 % Reduction in Area: 0% % Reduction in Volume: 0% Epithelialization: None Tunneling: No Undermining: No Wound Description Classification: Grade 2 Wound Margin: Distinct, outline attached Exudate Amount: Small Exudate Type: Serosanguineous Exudate Color: red, brown Foul Odor After Cleansing: No Slough/Fibrino Yes Wound Bed Granulation Amount: Large (67-100%) Exposed Structure Granulation Quality: Red Fascia Exposed: No Necrotic Amount: Small (1-33%) Fat Layer (Subcutaneous Tissue) Exposed: Yes Necrotic Quality: Adherent Slough Tendon Exposed: No Muscle Exposed: No Joint Exposed: No Bone Exposed: No Treatment Notes Wound #9 (Foot) Wound Laterality: Dorsal, Left Cleanser Peri-Wound Care Sween Lotion (Moisturizing lotion) Discharge Instruction: Apply moisturizing lotion as directed Topical Primary Dressing KerraCel Ag Gelling Fiber Dressing, 2x2 in (silver alginate) Discharge Instruction: Apply silver alginate to wound bed as  instructed Secondary Dressing Woven Gauze Sponge, Non-Sterile 4x4 in Discharge Instruction: Apply over primary dressing as directed. Secured With Compression Wrap Kerlix Roll 4.5x3.1 (in/yd) Discharge Instruction: Apply Kerlix and Coban compression as directed. Coban Self-Adherent Wrap 4x5 (in/yd) Discharge Instruction: Apply over Kerlix as directed. Compression Stockings Add-Ons Electronic Signature(s) Signed: 09/08/2021 4:38:38 PM By: Lorrin Jackson Entered By: Lorrin Jackson on 09/08/2021 14:47:59 -------------------------------------------------------------------------------- Vitals Details Patient Name: Date of Service: [name], [name] 09/08/2021 2:00 PM Medical Record Number: [id number] Patient Account Number: [id number] Date of Birth/Sex: Treating RN: March 28, 1936 (86 y.o. [name] Primary Care Caysie Minnifield: [name] Other Clinician: Referring Eldridge Marcott: Treating Carmeline Kowal/Extender: Guadalupe Maple, Karrar Weeks in Treatment: 7 Vital Signs Time Taken: 14:08 Temperature (F): 97.5 Height (in): 69 Pulse (bpm): 105 Weight (lbs): 185 Respiratory Rate (breaths/min): 18 Body Mass Index (BMI): 27.3 Blood Pressure (mmHg): 119/77 Capillary Blood Glucose (mg/dl): 120 Reference Range: 80 - 120 mg / dl Electronic Signature(s) Signed: 09/09/2021 10:42:23 AM By: Sandre Kitty Entered By: Sandre Kitty on 09/08/2021 14:08:58

## 2021-09-15 ENCOUNTER — Encounter (HOSPITAL_BASED_OUTPATIENT_CLINIC_OR_DEPARTMENT_OTHER): Payer: Medicare Other | Admitting: Internal Medicine

## 2021-09-15 ENCOUNTER — Other Ambulatory Visit: Payer: Self-pay

## 2021-09-15 DIAGNOSIS — R079 Chest pain, unspecified: Secondary | ICD-10-CM | POA: Diagnosis not present

## 2021-09-15 DIAGNOSIS — F1020 Alcohol dependence, uncomplicated: Secondary | ICD-10-CM | POA: Diagnosis not present

## 2021-09-15 DIAGNOSIS — Z20822 Contact with and (suspected) exposure to covid-19: Secondary | ICD-10-CM | POA: Diagnosis not present

## 2021-09-15 DIAGNOSIS — I081 Rheumatic disorders of both mitral and tricuspid valves: Secondary | ICD-10-CM | POA: Diagnosis not present

## 2021-09-15 DIAGNOSIS — I509 Heart failure, unspecified: Secondary | ICD-10-CM | POA: Diagnosis not present

## 2021-09-15 DIAGNOSIS — I5021 Acute systolic (congestive) heart failure: Secondary | ICD-10-CM | POA: Diagnosis not present

## 2021-09-15 DIAGNOSIS — I482 Chronic atrial fibrillation, unspecified: Secondary | ICD-10-CM | POA: Diagnosis not present

## 2021-09-15 DIAGNOSIS — R791 Abnormal coagulation profile: Secondary | ICD-10-CM | POA: Diagnosis not present

## 2021-09-15 DIAGNOSIS — I5023 Acute on chronic systolic (congestive) heart failure: Secondary | ICD-10-CM | POA: Diagnosis not present

## 2021-09-15 DIAGNOSIS — I472 Ventricular tachycardia, unspecified: Secondary | ICD-10-CM | POA: Diagnosis not present

## 2021-09-15 DIAGNOSIS — I872 Venous insufficiency (chronic) (peripheral): Secondary | ICD-10-CM | POA: Diagnosis not present

## 2021-09-15 DIAGNOSIS — I5043 Acute on chronic combined systolic (congestive) and diastolic (congestive) heart failure: Secondary | ICD-10-CM | POA: Diagnosis not present

## 2021-09-15 DIAGNOSIS — R778 Other specified abnormalities of plasma proteins: Secondary | ICD-10-CM | POA: Diagnosis present

## 2021-09-15 DIAGNOSIS — I6350 Cerebral infarction due to unspecified occlusion or stenosis of unspecified cerebral artery: Secondary | ICD-10-CM | POA: Diagnosis not present

## 2021-09-15 DIAGNOSIS — J449 Chronic obstructive pulmonary disease, unspecified: Secondary | ICD-10-CM | POA: Diagnosis not present

## 2021-09-15 DIAGNOSIS — I517 Cardiomegaly: Secondary | ICD-10-CM | POA: Diagnosis not present

## 2021-09-15 DIAGNOSIS — E871 Hypo-osmolality and hyponatremia: Secondary | ICD-10-CM | POA: Diagnosis not present

## 2021-09-15 DIAGNOSIS — I272 Pulmonary hypertension, unspecified: Secondary | ICD-10-CM | POA: Diagnosis not present

## 2021-09-15 DIAGNOSIS — I69398 Other sequelae of cerebral infarction: Secondary | ICD-10-CM | POA: Diagnosis not present

## 2021-09-15 DIAGNOSIS — R0789 Other chest pain: Secondary | ICD-10-CM | POA: Diagnosis not present

## 2021-09-15 DIAGNOSIS — R0609 Other forms of dyspnea: Secondary | ICD-10-CM | POA: Diagnosis not present

## 2021-09-15 DIAGNOSIS — I480 Paroxysmal atrial fibrillation: Secondary | ICD-10-CM | POA: Diagnosis present

## 2021-09-15 DIAGNOSIS — E785 Hyperlipidemia, unspecified: Secondary | ICD-10-CM | POA: Diagnosis present

## 2021-09-15 DIAGNOSIS — I426 Alcoholic cardiomyopathy: Secondary | ICD-10-CM | POA: Diagnosis not present

## 2021-09-15 DIAGNOSIS — M21372 Foot drop, left foot: Secondary | ICD-10-CM | POA: Diagnosis not present

## 2021-09-15 DIAGNOSIS — I4891 Unspecified atrial fibrillation: Secondary | ICD-10-CM | POA: Diagnosis not present

## 2021-09-15 DIAGNOSIS — Z7901 Long term (current) use of anticoagulants: Secondary | ICD-10-CM | POA: Diagnosis not present

## 2021-09-15 DIAGNOSIS — L97919 Non-pressure chronic ulcer of unspecified part of right lower leg with unspecified severity: Secondary | ICD-10-CM | POA: Diagnosis not present

## 2021-09-15 DIAGNOSIS — I255 Ischemic cardiomyopathy: Secondary | ICD-10-CM | POA: Diagnosis present

## 2021-09-15 DIAGNOSIS — L97929 Non-pressure chronic ulcer of unspecified part of left lower leg with unspecified severity: Secondary | ICD-10-CM | POA: Diagnosis not present

## 2021-09-15 DIAGNOSIS — I77810 Thoracic aortic ectasia: Secondary | ICD-10-CM | POA: Diagnosis not present

## 2021-09-15 DIAGNOSIS — I259 Chronic ischemic heart disease, unspecified: Secondary | ICD-10-CM | POA: Diagnosis not present

## 2021-09-15 DIAGNOSIS — I10 Essential (primary) hypertension: Secondary | ICD-10-CM | POA: Diagnosis not present

## 2021-09-15 DIAGNOSIS — K219 Gastro-esophageal reflux disease without esophagitis: Secondary | ICD-10-CM | POA: Diagnosis present

## 2021-09-15 DIAGNOSIS — I110 Hypertensive heart disease with heart failure: Secondary | ICD-10-CM | POA: Diagnosis not present

## 2021-09-15 DIAGNOSIS — J9811 Atelectasis: Secondary | ICD-10-CM | POA: Diagnosis not present

## 2021-09-15 DIAGNOSIS — I2510 Atherosclerotic heart disease of native coronary artery without angina pectoris: Secondary | ICD-10-CM | POA: Diagnosis not present

## 2021-09-15 DIAGNOSIS — I252 Old myocardial infarction: Secondary | ICD-10-CM | POA: Diagnosis not present

## 2021-09-15 DIAGNOSIS — I214 Non-ST elevation (NSTEMI) myocardial infarction: Secondary | ICD-10-CM | POA: Diagnosis not present

## 2021-09-15 DIAGNOSIS — L97812 Non-pressure chronic ulcer of other part of right lower leg with fat layer exposed: Secondary | ICD-10-CM | POA: Diagnosis not present

## 2021-09-15 NOTE — Progress Notes (Signed)
Revolorio, Callin M. ([id number]) Visit Report for 09/15/2021 Arrival Information Details Patient Name: Date of Service: [name] ID M. 09/15/2021 1:45 PM Medical Record Number: [id number] Patient Account Number: [id number] Date of Birth/Sex: Treating RN: 28-Sep-1935 (86 y.o. M) Primary Care Macaila Tahir: [name] Other Clinician: Referring Lucillie Kiesel: Treating Kayn Haymore/Extender: Andres Labrum in Treatment: 8 Visit Information History Since Last Visit Added or deleted any medications: No Patient Arrived: Ambulatory Any new allergies or adverse reactions: No Arrival Time: 13:44 Had a fall or experienced change in No Transfer Assistance: None activities of daily living that may affect Patient Identification Verified: Yes risk of falls: Secondary Verification Process Completed: Yes Signs or symptoms of abuse/neglect since last visito No Patient Requires Transmission-Based Precautions: No Hospitalized since last visit: No Patient Has Alerts: Yes Implantable device outside of the clinic excluding No cellular tissue based products placed in the center since last visit: Has Dressing in Place as Prescribed: Yes Has Compression in Place as Prescribed: Yes Pain Present Now: No Electronic Signature(s) Signed: 09/15/2021 4:38:27 PM By: Lorrin Jackson Entered By: Lorrin Jackson on 09/15/2021 13:54:36 -------------------------------------------------------------------------------- Encounter Discharge Information Details Patient Name: Date of Service: [name], [name] 09/15/2021 1:45 PM Medical Record Number: [id number] Patient Account Number: [id number] Date of Birth/Sex: Treating RN: 1936/03/01 (86 y.o. [name] Primary Care Tyrelle Raczka: [name] Other Clinician: Referring Joline Encalada: Treating Keira Bohlin/Extender: Bryon Lions in Treatment: 8 Encounter Discharge Information Items Post Procedure Vitals Discharge Condition: Stable Temperature (F):  97.6 Ambulatory Status: Ambulatory Pulse (bpm): 76 Discharge Destination: Home Respiratory Rate (breaths/min): 18 Transportation: Private Auto Blood Pressure (mmHg): 136/80 Schedule Follow-up Appointment: Yes Clinical Summary of Care: Provided on 09/15/2021 Form Type Recipient Paper Patient Patient Electronic Signature(s) Signed: 09/15/2021 4:38:27 PM By: Lorrin Jackson Entered By: Lorrin Jackson on 09/15/2021 14:58:39 -------------------------------------------------------------------------------- Lower Extremity Assessment Details Patient Name: Date of Service: [name], Shaune Pascal ID M. 09/15/2021 1:45 PM Medical Record Number: [id number] Patient Account Number: [id number] Date of Birth/Sex: Treating RN: 27-Aug-1936 (86 y.o. [name] Primary Care Sarissa Dern: [name] Other Clinician: Referring Parke Jandreau: Treating Lujuana Kapler/Extender: Bryon Lions in Treatment: 8 Edema Assessment Assessed: Shirlyn Goltz: Yes] Patrice Paradise: Yes] Edema: [Left: No] [Right: No] Calf Left: Right: Point of Measurement: 31 cm From Medial Instep 36.2 cm 40 cm Ankle Left: Right: Point of Measurement: 11 cm From Medial Instep 22.3 cm 21 cm Vascular Assessment Pulses: Dorsalis Pedis Palpable: [Left:Yes] [Right:Yes] Electronic Signature(s) Signed: 09/15/2021 4:38:27 PM By: Lorrin Jackson Entered By: Lorrin Jackson on 09/15/2021 13:56:54 -------------------------------------------------------------------------------- Multi Wound Chart Details Patient Name: Date of Service: [name], [name] 09/15/2021 1:45 PM Medical Record Number: [id number] Patient Account Number: [id number] Date of Birth/Sex: Treating RN: 05/02/1936 (86 y.o. [name] Primary Care Kristoph Sattler: [name] Other Clinician: Referring Kanaan Kagawa: Treating Doristine Shehan/Extender: Bryon Lions in Treatment: 8 Vital Signs Height(in): 50 Capillary Blood Glucose(mg/dl): 134 Weight(lbs):  185 Pulse(bpm): 34 Body Mass Index(BMI): 27 Blood Pressure(mmHg): 136/80 Temperature(F): 97.6 Respiratory Rate(breaths/min): 18 Photos: [10:Right, Circumferential Lower Leg] [9:Left, Dorsal Foot] [N/A:N/A N/A] Wound Location: [10:Blister] [9:Gradually Appeared] [N/A:N/A] Wounding Event: [10:Venous Leg Ulcer] [9:Diabetic Wound/Ulcer of the Lower] [N/A:N/A] Primary Etiology: [10:N/A] [9:Extremity Lymphedema] [N/A:N/A] Secondary Etiology: [10:Cataracts, Glaucoma, Chronic] [9:Cataracts, Glaucoma, Chronic] [N/A:N/A] Comorbid History: [10:Obstructive Pulmonary Disease (COPD), Arrhythmia, Congestive Heart Failure, Coronary Artery Disease, Hypertension, Type II Diabetes, Gout, Neuropathy 09/12/2021] [9:Obstructive Pulmonary Disease (COPD), Arrhythmia, Congestive Heart  Failure, Coronary Artery Disease, Hypertension, Type II Diabetes, Gout, Neuropathy 08/04/2021] [  N/A:N/A] Date Acquired: [10:0] [9:6] [N/A:N/A] Weeks of Treatment: [10:Open] [9:Open] [N/A:N/A] Wound Status: [10:9x22x0.1] [9:0.3x0.2x0.1] [N/A:N/A] Measurements L x W x D (cm) [10:155.509] [9:0.047] [N/A:N/A] A (cm) : rea [10:15.551] [9:0.005] [N/A:N/A] Volume (cm) : [10:0.00%] [9:50.00%] [N/A:N/A] % Reduction in A [10:rea: 0.00%] [9:44.40%] [N/A:N/A] % Reduction in Volume: [10:Full Thickness Without Exposed] [9:Grade 2] [N/A:N/A] Classification: [10:Support Structures Large] [9:Small] [N/A:N/A] Exudate A mount: [10:Serous] [9:Serosanguineous] [N/A:N/A] Exudate Type: [10:amber] [9:red, brown] [N/A:N/A] Exudate Color: [10:Distinct, outline attached] [9:Distinct, outline attached] [N/A:N/A] Wound Margin: [10:Medium (34-66%)] [9:Large (67-100%)] [N/A:N/A] Granulation A mount: [10:Red] [9:Red] [N/A:N/A] Granulation Quality: [10:Medium (34-66%)] [9:None Present (0%)] [N/A:N/A] Necrotic A mount: [10:Fat Layer (Subcutaneous Tissue): Yes Fat Layer (Subcutaneous Tissue): Yes N/A] Exposed Structures: [10:Fascia: No Tendon: No Muscle: No  Joint: No Bone: No None] [9:Fascia: No Tendon: No Muscle: No Joint: No Bone: No Medium (34-66%)] [N/A:N/A] Epithelialization: [10:Debridement - Excisional] [9:N/A] [N/A:N/A] Debridement: Pre-procedure Verification/Time Out 14:19 [9:N/A] [N/A:N/A] Taken: [10:Other] [9:N/A] [N/A:N/A] Pain Control: [10:Subcutaneous, Slough] [9:N/A] [N/A:N/A] Tissue Debrided: [10:Skin/Subcutaneous Tissue] [9:N/A] [N/A:N/A] Level: [10:90] [9:N/A] [N/A:N/A] Debridement A (sq cm): [10:rea Curette] [9:N/A] [N/A:N/A] Instrument: [10:Minimum] [9:N/A] [N/A:N/A] Bleeding: [10:Pressure] [9:N/A] [N/A:N/A] Hemostasis A chieved: [10:Procedure was tolerated well] [9:N/A] [N/A:N/A] Debridement Treatment Response: [10:9x22x0.1] [9:N/A] [N/A:N/A] Post Debridement Measurements L x W x D (cm) [10:15.551] [9:N/A] [N/A:N/A] Post Debridement Volume: (cm) [10:Debridement] [9:N/A] [N/A:N/A] Treatment Notes Electronic Signature(s) Signed: 09/15/2021 3:43:46 PM By: Linton Ham MD Signed: 09/15/2021 4:46:25 PM By: Baruch Gouty RN, BSN Entered By: Linton Ham on 09/15/2021 14:47:57 -------------------------------------------------------------------------------- Multi-Disciplinary Care Plan Details Patient Name: Date of Service: [name], [name] 09/15/2021 1:45 PM Medical Record Number: [id number] Patient Account Number: [id number] Date of Birth/Sex: Treating RN: 12-04-1935 (86 y.o. [name] Primary Care Dvaughn Fickle: [name] Other Clinician: Referring Zaina Jenkin: Treating Anajulia Leyendecker/Extender: Bryon Lions in Treatment: [place] reviewed with physician Active Inactive Nutrition Nursing Diagnoses: Impaired glucose control: actual or potential Potential for alteratiion in Nutrition/Potential for imbalanced nutrition Goals: Patient/caregiver will maintain therapeutic glucose control Date Initiated: 07/15/2021 Target Resolution Date: 10/20/2021 Goal Status:  Active Interventions: Assess HgA1c results as ordered upon admission and as needed Assess patient nutrition upon admission and as needed per policy Provide education on elevated blood sugars and impact on wound healing Treatment Activities: Dietary management education, guidance and counseling : 07/15/2021 Giving encouragement to exercise : 07/15/2021 Patient referred to Primary Care Physician for further nutritional evaluation : 07/15/2021 Notes: 08/11/21: Glucose control ongoing 09/15/20: Glucose control continues Venous Leg Ulcer Nursing Diagnoses: Knowledge deficit related to disease process and management Potential for venous Insuffiency (use before diagnosis confirmed) Goals: Patient will maintain optimal edema control Date Initiated: 07/15/2021 Target Resolution Date: 10/20/2021 Goal Status: Active Interventions: Assess peripheral edema status every visit. Compression as ordered Provide education on venous insufficiency Treatment Activities: Therapeutic compression applied : 07/15/2021 Notes: 08/11/21: Edema control ongoing 09/15/20: Edema control continues with wraps Wound/Skin Impairment Nursing Diagnoses: Impaired tissue integrity Knowledge deficit related to ulceration/compromised skin integrity Goals: Patient/caregiver will verbalize understanding of skin care regimen Date Initiated: 07/15/2021 Target Resolution Date: 10/20/2021 Goal Status: Active Ulcer/skin breakdown will have a volume reduction of 30% by week 4 Date Initiated: 07/15/2021 Date Inactivated: 08/11/2021 Target Resolution Date: 08/12/2021 Goal Status: Met Ulcer/skin breakdown will have a volume reduction of 50% by week 8 Date Initiated: 08/11/2021 Target Resolution Date: 09/15/2021 Goal Status: Active Interventions: Assess patient/caregiver ability to obtain necessary supplies Assess patient/caregiver ability to perform ulcer/skin care regimen upon admission and as needed  Assess ulceration(s)  every visit Provide education on ulcer and skin care Treatment Activities: Skin care regimen initiated : 07/15/2021 Topical wound management initiated : 07/15/2021 Notes: 09/15/20: Wound care regimen continues, patient compliant. Electronic Signature(s) Signed: 09/15/2021 4:38:27 PM By: Lorrin Jackson Entered By: Lorrin Jackson on 09/15/2021 14:03:36 -------------------------------------------------------------------------------- Pain Assessment Details Patient Name: Date of Service: [name], [name] 09/15/2021 1:45 PM Medical Record Number: [id number] Patient Account Number: [id number] Date of Birth/Sex: Treating RN: 09/29/1935 (86 y.o. [name] Primary Care Letitia Sabala: [name] Other Clinician: Referring Gavan Nordby: Treating Cliffard Hair/Extender: Bryon Lions in Treatment: 8 Active Problems Location of Pain Severity and Description of Pain Patient Has Paino No Site Locations Pain Management and Medication Current Pain Management: Electronic Signature(s) Signed: 09/15/2021 4:38:27 PM By: Lorrin Jackson Entered By: Lorrin Jackson on 09/15/2021 13:55:32 -------------------------------------------------------------------------------- Patient/Caregiver Education Details Patient Name: Date of Service: DA Burgess Estelle 1/18/2023andnbsp1:45 PM Medical Record Number: [id number] Patient Account Number: [id number] Date of Birth/Gender: Treating RN: [birth year] (86 y.o. [name] Primary Care Physician: [name] Other Clinician: Referring Physician: Treating Physician/Extender: Bryon Lions in Treatment: 8 Education Assessment Education Provided To: Patient Education Topics Provided Elevated Blood Sugar/ Impact on Healing: Methods: Explain/Verbal, Printed Responses: State content correctly Venous: Methods: Explain/Verbal, Printed Responses: State content correctly Wound/Skin Impairment: Methods:  Explain/Verbal, Printed Responses: State content correctly Electronic Signature(s) Signed: 09/15/2021 4:38:27 PM By: Lorrin Jackson Entered By: Lorrin Jackson on 09/15/2021 14:04:01 -------------------------------------------------------------------------------- Wound Assessment Details Patient Name: Date of Service: [name], Shaune Pascal ID M. 09/15/2021 1:45 PM Medical Record Number: [id number] Patient Account Number: [id number] Date of Birth/Sex: Treating RN: May 14, 1936 (86 y.o. [name] Primary Care Kiko Ripp: [name] Other Clinician: Referring Towanda Hornstein: Treating Yulisa Chirico/Extender: Bryon Lions in Treatment: 8 Wound Status Wound Number: 10 Primary Venous Leg Ulcer Etiology: Wound Location: Right, Circumferential Lower Leg Wound Open Wounding Event: Blister Status: Date Acquired: 09/12/2021 Comorbid Cataracts, Glaucoma, Chronic Obstructive Pulmonary Disease Weeks Of Treatment: 0 History: (COPD), Arrhythmia, Congestive Heart Failure, Coronary Artery Clustered Wound: No Disease, Hypertension, Type II Diabetes, Gout, Neuropathy Photos Wound Measurements Length: (cm) 9 Width: (cm) 22 Depth: (cm) 0.1 Area: (cm) 155.509 Volume: (cm) 15.551 % Reduction in Area: 0% % Reduction in Volume: 0% Epithelialization: None Tunneling: No Undermining: No Wound Description Classification: Full Thickness Without Exposed Support Structures Wound Margin: Distinct, outline attached Exudate Amount: Large Exudate Type: Serous Exudate Color: amber Foul Odor After Cleansing: No Slough/Fibrino Yes Wound Bed Granulation Amount: Medium (34-66%) Exposed Structure Granulation Quality: Red Fascia Exposed: No Necrotic Amount: Medium (34-66%) Fat Layer (Subcutaneous Tissue) Exposed: Yes Necrotic Quality: Adherent Slough Tendon Exposed: No Muscle Exposed: No Joint Exposed: No Bone Exposed: No Treatment Notes Wound #10 (Lower Leg) Wound Laterality: Right,  Circumferential Cleanser Soap and Water Discharge Instruction: May shower and wash wound with dial antibacterial soap and water prior to dressing change. Wound Cleanser Discharge Instruction: Cleanse the wound with wound cleanser prior to applying a clean dressing using gauze sponges, not tissue or cotton balls. Peri-Wound Care Triamcinolone 15 (g) Discharge Instruction: Use triamcinolone 15 (g) as directed Sween Lotion (Moisturizing lotion) Discharge Instruction: Apply moisturizing lotion as directed Topical Primary Dressing IODOFLEX 0.9% Cadexomer Iodine Pad 4x6 cm Discharge Instruction: Apply to wound bed as instructed Secondary Dressing Woven Gauze Sponge, Non-Sterile 4x4 in Discharge Instruction: Apply over primary dressing as directed. ABD Pad, 8x10 Discharge Instruction: Apply over primary dressing as directed. Secured With Compression Wrap Kerlix  Roll 4.5x3.1 (in/yd) Discharge Instruction: Apply Kerlix and Coban compression as directed. Coban Self-Adherent Wrap 4x5 (in/yd) Discharge Instruction: Apply over Kerlix as directed. Compression Stockings Add-Ons Electronic Signature(s) Signed: 09/15/2021 4:38:27 PM By: Lorrin Jackson Entered By: Lorrin Jackson on 09/15/2021 14:36:32 -------------------------------------------------------------------------------- Wound Assessment Details Patient Name: Date of Service: [name], [name] 09/15/2021 1:45 PM Medical Record Number: [id number] Patient Account Number: [id number] Date of Birth/Sex: Treating RN: 07-20-1936 (86 y.o. [name] Primary Care Donnice Nielsen: [name] Other Clinician: Referring Yovan Leeman: Treating Asser Lucena/Extender: Bryon Lions in Treatment: 8 Wound Status Wound Number: 9 Primary Diabetic Wound/Ulcer of the Lower Extremity Etiology: Wound Location: Left, Dorsal Foot Secondary Lymphedema Wounding Event: Gradually Appeared Etiology: Date Acquired: 08/04/2021 Date  Acquired: 08/04/2021 Wound Open Weeks Of Treatment: 6 Status: Clustered Wound: No Comorbid Cataracts, Glaucoma, Chronic Obstructive Pulmonary Disease History: (COPD), Arrhythmia, Congestive Heart Failure, Coronary Artery Disease, Hypertension, Type II Diabetes, Gout, Neuropathy Photos Wound Measurements Length: (cm) 0.3 Width: (cm) 0.2 Depth: (cm) 0.1 Area: (cm) 0.047 Volume: (cm) 0.005 % Reduction in Area: 50% % Reduction in Volume: 44.4% Epithelialization: Medium (34-66%) Tunneling: No Undermining: No Wound Description Classification: Grade 2 Wound Margin: Distinct, outline attached Exudate Amount: Small Exudate Type: Serosanguineous Exudate Color: red, brown Foul Odor After Cleansing: No Slough/Fibrino No Wound Bed Granulation Amount: Large (67-100%) Exposed Structure Granulation Quality: Red Fascia Exposed: No Necrotic Amount: None Present (0%) Fat Layer (Subcutaneous Tissue) Exposed: Yes Tendon Exposed: No Muscle Exposed: No Joint Exposed: No Bone Exposed: No Treatment Notes Wound #9 (Foot) Wound Laterality: Dorsal, Left Cleanser Soap and Water Discharge Instruction: May shower and wash wound with dial antibacterial soap and water prior to dressing change. Wound Cleanser Discharge Instruction: Cleanse the wound with wound cleanser prior to applying a clean dressing using gauze sponges, not tissue or cotton balls. Peri-Wound Care Triamcinolone 15 (g) Discharge Instruction: Use triamcinolone 15 (g) as directed Sween Lotion (Moisturizing lotion) Discharge Instruction: Apply moisturizing lotion as directed Topical Primary Dressing KerraCel Ag Gelling Fiber Dressing, 2x2 in (silver alginate) Discharge Instruction: Apply silver alginate to wound bed as instructed Secondary Dressing Woven Gauze Sponge, Non-Sterile 4x4 in Discharge Instruction: Apply over primary dressing as directed. Secured With Compression Wrap Kerlix Roll 4.5x3.1 (in/yd) Discharge  Instruction: Apply Kerlix and Coban compression as directed. Coban Self-Adherent Wrap 4x5 (in/yd) Discharge Instruction: Apply over Kerlix as directed. Compression Stockings Add-Ons Electronic Signature(s) Signed: 09/15/2021 4:38:27 PM By: Lorrin Jackson Entered By: Lorrin Jackson on 09/15/2021 14:01:54 -------------------------------------------------------------------------------- Vitals Details Patient Name: Date of Service: [name], [name] 09/15/2021 1:45 PM Medical Record Number: [id number] Patient Account Number: [id number] Date of Birth/Sex: Treating RN: 1935-08-30 (86 y.o. [name] Primary Care Trystian Crisanto: [name] Other Clinician: Referring Carrin Vannostrand: Treating April Carlyon/Extender: Bryon Lions in Treatment: 8 Vital Signs Time Taken: 13:54 Temperature (F): 97.6 Height (in): 69 Pulse (bpm): 76 Weight (lbs): 185 Respiratory Rate (breaths/min): 18 Body Mass Index (BMI): 27.3 Blood Pressure (mmHg): 136/80 Capillary Blood Glucose (mg/dl): 134 Reference Range: 80 - 120 mg / dl Electronic Signature(s) Signed: 09/15/2021 4:38:27 PM By: Lorrin Jackson Entered By: Lorrin Jackson on 09/15/2021 13:55:04

## 2021-09-15 NOTE — Progress Notes (Signed)
Shahan, [name] M. ([id number]) Visit Report for 09/15/2021 Debridement Details Patient Name: Date of Service: [name] ID M. 09/15/2021 1:45 PM Medical Record Number: [id number] Patient Account Number: [id number] Date of Birth/Sex: Treating RN: 1935-09-12 (86 y.o. [name] Primary Care Provider: [name] Other Clinician: Referring Provider: Treating Provider/Extender: Bryon Lions in Treatment: 8 Debridement Performed for Assessment: Wound #10 Right,Circumferential Lower Leg Performed By: Physician Ricard Dillon., MD Debridement Type: Debridement Severity of Tissue Pre Debridement: Fat layer exposed Level of Consciousness (Pre-procedure): Awake and Alert Pre-procedure Verification/Time Out Yes - 14:19 Taken: Start Time: 14:20 Pain Control: Other : Benzocaine T Area Debrided (L x W): otal 9 (cm) x 5 (cm) = 45 (cm) Tissue and other material debrided: Non-Viable, Slough, Subcutaneous, Slough Level: Skin/Subcutaneous Tissue Debridement Description: Excisional Instrument: Curette Bleeding: Minimum Hemostasis Achieved: Pressure End Time: 14:27 Response to Treatment: Procedure was tolerated well Level of Consciousness (Post- Awake and Alert procedure): Post Debridement Measurements of Total Wound Length: (cm) 9 Width: (cm) 22 Depth: (cm) 0.1 Volume: (cm) 15.551 Character of Wound/Ulcer Post Debridement: Stable Severity of Tissue Post Debridement: Fat layer exposed Post Procedure Diagnosis Same as Pre-procedure Electronic Signature(s) Signed: 09/15/2021 3:43:46 PM By: Linton Ham MD Signed: 09/15/2021 4:46:25 PM By: Baruch Gouty RN, BSN Entered By: Linton Ham on 09/15/2021 14:48:26 -------------------------------------------------------------------------------- HPI Details Patient Name: Date of Service: [name], [name] 09/15/2021 1:45 PM Medical Record Number: [id number] Patient Account Number: [id number] Date of  Birth/Sex: Treating RN: 11/24/1935 (86 y.o. [name] Primary Care Provider: [name] Other Clinician: Referring Provider: Treating Provider/Extender: Bryon Lions in Treatment: 8 History of Present Illness HPI Description: ADMISSION 07/15/2021 This is an 86 year old man who came here for evaluation of wounds on his bilateral predominantly anterior lower legs mid aspect of the tibia. He seems to have been dealing with this for most of this year. Currently with 3 wounds on the right and 2 on the left. At least some of these seem to start off as blisters probably the majority. He has been following with Dr. Anabel Bene of [facility] dermatology according the patient back he has an appointment Dr. Anabel Bene tomorrow he was given a prescription for triamcinolone which she has been applying daily in fact it was renewed yesterday. Patient is a diabetic no recent hemoglobin A1c. He is on Coumadin for persistent atrial fibrillation. Vein and vascular in December 2021 at which time he had bilateral foot pain and finger pain. ABIs bilaterally were noncompressible however the TBI on the right was only 0.39. There were biphasic waveforms on the left his ABI was noncompressible with biphasic waveforms but with a TBI of 0.6. He was not felt to have a primary vascular issue. There is a mention of Raynaud's phenomenon as well. Past medical history includes cellulitis of the right leg in June 2022, type 2 diabetes, persistent lower extremity edema, bilateral foot and ankle pain, Raynaud's phenomenon, coronary artery disease, congestive heart failure, pacemaker, none sustained ventricular tachycardia, remote CVA paroxysmal atrial fib on Coumadin. We did not reattempt ABIs in our clinic 07/22/2019 patient appears to be doing decently well in regard to his wounds as compared to last week with the wounds that were present last week. Fortunately I do not see any signs of active  infection which is great news. No fevers, chills, nausea, vomiting, or diarrhea. With that being said he has several new blisters that are getting need to be addressed today. 07/28/2021  upon evaluation today patient actually appears to be making excellent progress. I am extremely pleased with where we stand today. I think that his wounds are significantly improved compared to where they were previous. 08/04/2021 upon evaluation today patient appears to be doing much better in regard to his wounds in general. I feel like across the board he is showing signs of significant improvement. Fortunately there is no evidence of infection which is great news. No fevers, chills, nausea, vomiting, or diarrhea. 08/11/2021 upon evaluation today patient actually appears to be doing quite well in regard to his wound. Has been tolerating the dressing changes without complication. Fortunately I do not see any signs of infection currently which is great news as well. No fevers, chills, nausea, vomiting, or diarrhea. 08/18/2021 on evaluation today patient appears to be doing excellent he is making good progress and overall I am extremely pleased with where we stand today. There does not appear to be any signs of active infection at this time which is great news. 09/01/2020 upon evaluation patient's wounds on both the left and the right appear to be at 0.1 cm measurement and are very close to complete resolution. Were not quite completely done yet but this is very close. Overall I am extremely happy with where things stand and I think he is headed in the right direction. 09/08/2021 upon evaluation today patient appears to be doing well in regard to his right leg which is showing signs of being completely healed which is excellent. Fortunately I do not see any evidence of active infection locally nor systemically at this point which is also great news. No fevers, chills, nausea, vomiting, or diarrhea. 09/15/2021; this is a  patient we have been following for now a small wound on the left dorsal foot. He had wounds on the right legs and was discharged to 20/30 below-knee stockings apparently in late December. He came in today with marked increase in swelling in the right leg large areas of denuded skin which probably started as blisters. He did not have a stocking on Electronic Signature(s) Signed: 09/15/2021 3:43:46 PM By: Linton Ham MD Entered By: Linton Ham on 09/15/2021 14:49:40 -------------------------------------------------------------------------------- Physical Exam Details Patient Name: Date of Service: [name], [name] 09/15/2021 1:45 PM Medical Record Number: [id number] Patient Account Number: [id number] Date of Birth/Sex: Treating RN: [birth year] (86 y.o. [name] Primary Care Provider: [name] Other Clinician: Referring Provider: Treating Provider/Extender: Bryon Lions in Treatment: 8 Constitutional Sitting or standing Blood Pressure is within target range for patient.. Pulse regular and within target range for patient.Marland Kitchen Respirations regular, non-labored and within target range.. Temperature is normal and within the target range for the patient.Marland Kitchen Appears in no distress. Cardiovascular Pedal pulses are difficult to feel. Integumentary (Hair, Skin) Skin changes of chronic venous insufficiency. Notes Wound exam; the right leg that was completely healed last week has large areas of denuded nonviable skin anteriorly extending posteriorly. All of this was removed. On the anterior part of this a completely necrotic surface and a vibrant white color. This required an extensive debridement but even with this I do not know that I am able to get to viable tissue for epithelialization. He has 2-3+ edema in the leg no signs of a DVT otherwise Small wound on the left dorsal foot looks like it is just about closing Electronic Signature(s) Signed: 09/15/2021  3:43:46 PM By: Linton Ham MD Entered By: Linton Ham on 09/15/2021 14:51:17 -------------------------------------------------------------------------------- Physician Orders Details  Patient Name: Date of Service: [name] ID M. 09/15/2021 1:45 PM Medical Record Number: [id number] Patient Account Number: [id number] Date of Birth/Sex: Treating RN: [birth year] (86 y.o. [name] Primary Care Provider: [name] Other Clinician: Referring Provider: Treating Provider/Extender: Bryon Lions in Treatment: 8 Verbal / Phone Orders: No Diagnosis Coding Follow-up Appointments ppointment in 1 week. - with Margarita Grizzle Return A Bathing/ Shower/ Hygiene May shower with protection but do not get wound dressing(s) wet. Edema Control - Lymphedema / SCD / Other Bilateral Lower Extremities Elevate legs to the level of the heart or above for 30 minutes daily and/or when sitting, a frequency of: - whenever sitting Avoid standing for long periods of time. Patient to wear own compression stockings every day. - Compression Stocking to right leg: apply in morning and remove at bedtime. Exercise regularly Additional Orders / Instructions Follow Nutritious Diet - Monitor blood sugars Wound Treatment Wound #10 - Lower Leg Wound Laterality: Right, Circumferential Cleanser: Soap and Water 1 x Per Week/30 Days Discharge Instructions: May shower and wash wound with dial antibacterial soap and water prior to dressing change. Cleanser: Wound Cleanser 1 x Per Week/30 Days Discharge Instructions: Cleanse the wound with wound cleanser prior to applying a clean dressing using gauze sponges, not tissue or cotton balls. Peri-Wound Care: Triamcinolone 15 (g) 1 x Per Week/30 Days Discharge Instructions: Use triamcinolone 15 (g) as directed Peri-Wound Care: Sween Lotion (Moisturizing lotion) 1 x Per Week/30 Days Discharge Instructions: Apply moisturizing lotion as directed Prim  Dressing: IODOFLEX 0.9% Cadexomer Iodine Pad 4x6 cm 1 x Per Week/30 Days ary Discharge Instructions: Apply to wound bed as instructed Secondary Dressing: Woven Gauze Sponge, Non-Sterile 4x4 in 1 x Per Week/30 Days Discharge Instructions: Apply over primary dressing as directed. Secondary Dressing: ABD Pad, 8x10 1 x Per Week/30 Days Discharge Instructions: Apply over primary dressing as directed. Compression Wrap: Kerlix Roll 4.5x3.1 (in/yd) 1 x Per Week/30 Days Discharge Instructions: Apply Kerlix and Coban compression as directed. Compression Wrap: Coban Self-Adherent Wrap 4x5 (in/yd) 1 x Per Week/30 Days Discharge Instructions: Apply over Kerlix as directed. Wound #9 - Foot Wound Laterality: Dorsal, Left Cleanser: Soap and Water 1 x Per Week/15 Days Discharge Instructions: May shower and wash wound with dial antibacterial soap and water prior to dressing change. Cleanser: Wound Cleanser 1 x Per Week/15 Days Discharge Instructions: Cleanse the wound with wound cleanser prior to applying a clean dressing using gauze sponges, not tissue or cotton balls. Peri-Wound Care: Triamcinolone 15 (g) 1 x Per Week/15 Days Discharge Instructions: Use triamcinolone 15 (g) as directed Peri-Wound Care: Sween Lotion (Moisturizing lotion) 1 x Per Week/15 Days Discharge Instructions: Apply moisturizing lotion as directed Prim Dressing: KerraCel Ag Gelling Fiber Dressing, 2x2 in (silver alginate) 1 x Per Week/15 Days ary Discharge Instructions: Apply silver alginate to wound bed as instructed Secondary Dressing: Woven Gauze Sponge, Non-Sterile 4x4 in 1 x Per Week/15 Days Discharge Instructions: Apply over primary dressing as directed. Compression Wrap: Kerlix Roll 4.5x3.1 (in/yd) 1 x Per Week/15 Days Discharge Instructions: Apply Kerlix and Coban compression as directed. Compression Wrap: Coban Self-Adherent Wrap 4x5 (in/yd) 1 x Per Week/15 Days Discharge Instructions: Apply over Kerlix as  directed. Electronic Signature(s) Signed: 09/15/2021 3:43:46 PM By: Linton Ham MD Signed: 09/15/2021 4:38:27 PM By: Lorrin Jackson Entered By: Lorrin Jackson on 09/15/2021 14:29:18 -------------------------------------------------------------------------------- Problem List Details Patient Name: Date of Service: [name], [name] 09/15/2021 1:45 PM Medical Record Number: [id number] Patient Account  Number: [id number] Date of Birth/Sex: Treating RN: March 04, 1936 (86 y.o. Ulyses Amor, Vaughan Basta Primary Care Provider: [name] Other Clinician: Referring Provider: Treating Provider/Extender: Bryon Lions in Treatment: 8 Active Problems ICD-10 Encounter Code Description Active Date MDM Diagnosis L97.818 Non-pressure chronic ulcer of other part of right lower leg with other specified 07/15/2021 No Yes severity L97.522 Non-pressure chronic ulcer of other part of left foot with fat layer exposed 08/04/2021 No Yes I87.333 Chronic venous hypertension (idiopathic) with ulcer and inflammation of 07/15/2021 No Yes bilateral lower extremity E11.51 Type 2 diabetes mellitus with diabetic peripheral angiopathy without gangrene 07/15/2021 No Yes Inactive Problems ICD-10 Code Description Active Date Inactive Date L97.828 Non-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 leg with other specified severity 07/15/2021 07/15/2021 Resolved Problems Electronic Signature(s) Signed: 09/15/2021 3:43:46 PM By: Linton Ham MD Entered By: Linton Ham on 09/15/2021 14:47:34 -------------------------------------------------------------------------------- Progress Note Details Patient Name: Date of Service: [name], [name] 09/15/2021 1:45 PM Medical Record Number: [id number] Patient Account Number: [id number] Date of Birth/Sex: Treating RN: Dec 27, 1935 (86 y.o. [name] Primary Care Provider: [name] Other Clinician: Referring Provider: Treating  Provider/Extender: Bryon Lions in Treatment: 8 Subjective History of Present Illness (HPI) ADMISSION 07/15/2021 This is an 87 year old man who came here for evaluation of wounds on his bilateral predominantly anterior lower legs mid aspect of the tibia. He seems to have been dealing with this for most of this year. Currently with 3 wounds on the right and 2 on the left. At least some of these seem to start off as blisters probably the majority. He has been following with Dr. Anabel Bene of [facility] dermatology according the patient back he has an appointment Dr. Anabel Bene tomorrow he was given a prescription for triamcinolone which she has been applying daily in fact it was renewed yesterday. Patient is a diabetic no recent hemoglobin A1c. He is on Coumadin for persistent atrial fibrillation. Vein and vascular in December 2021 at which time he had bilateral foot pain and finger pain. ABIs bilaterally were noncompressible however the TBI on the right was only 0.39. There were biphasic waveforms on the left his ABI was noncompressible with biphasic waveforms but with a TBI of 0.6. He was not felt to have a primary vascular issue. There is a mention of Raynaud's phenomenon as well. Past medical history includes cellulitis of the right leg in June 2022, type 2 diabetes, persistent lower extremity edema, bilateral foot and ankle pain, Raynaud's phenomenon, coronary artery disease, congestive heart failure, pacemaker, none sustained ventricular tachycardia, remote CVA paroxysmal atrial fib on Coumadin. We did not reattempt ABIs in our clinic 07/22/2019 patient appears to be doing decently well in regard to his wounds as compared to last week with the wounds that were present last week. Fortunately I do not see any signs of active infection which is great news. No fevers, chills, nausea, vomiting, or diarrhea. With that being said he has several new blisters that are getting need  to be addressed today. 07/28/2021 upon evaluation today patient actually appears to be making excellent progress. I am extremely pleased with where we stand today. I think that his wounds are significantly improved compared to where they were previous. 08/04/2021 upon evaluation today patient appears to be doing much better in regard to his wounds in general. I feel like across the board he is showing signs of significant improvement. Fortunately there is no evidence of infection which is great news. No  fevers, chills, nausea, vomiting, or diarrhea. 08/11/2021 upon evaluation today patient actually appears to be doing quite well in regard to his wound. Has been tolerating the dressing changes without complication. Fortunately I do not see any signs of infection currently which is great news as well. No fevers, chills, nausea, vomiting, or diarrhea. 08/18/2021 on evaluation today patient appears to be doing excellent he is making good progress and overall I am extremely pleased with where we stand today. There does not appear to be any signs of active infection at this time which is great news. 09/01/2020 upon evaluation patient's wounds on both the left and the right appear to be at 0.1 cm measurement and are very close to complete resolution. Were not quite completely done yet but this is very close. Overall I am extremely happy with where things stand and I think he is headed in the right direction. 09/08/2021 upon evaluation today patient appears to be doing well in regard to his right leg which is showing signs of being completely healed which is excellent. Fortunately I do not see any evidence of active infection locally nor systemically at this point which is also great news. No fevers, chills, nausea, vomiting, or diarrhea. 09/15/2021; this is a patient we have been following for now a small wound on the left dorsal foot. He had wounds on the right legs and was discharged to 20/30 below-knee  stockings apparently in late December. He came in today with marked increase in swelling in the right leg large areas of denuded skin which probably started as blisters. He did not have a stocking on Objective Constitutional Sitting or standing Blood Pressure is within target range for patient.. Pulse regular and within target range for patient.Marland Kitchen Respirations regular, non-labored and within target range.. Temperature is normal and within the target range for the patient.Marland Kitchen Appears in no distress. Vitals Time Taken: 1:54 PM, Height: 69 in, Weight: 185 lbs, BMI: 27.3, Temperature: 97.6 F, Pulse: 76 bpm, Respiratory Rate: 18 breaths/min, Blood Pressure: 136/80 mmHg, Capillary Blood Glucose: 134 mg/dl. Cardiovascular Pedal pulses are difficult to feel. General Notes: Wound exam; the right leg that was completely healed last week has large areas of denuded nonviable skin anteriorly extending posteriorly. All of this was removed. On the anterior part of this a completely necrotic surface and a vibrant white color. This required an extensive debridement but even with this I do not know that I am able to get to viable tissue for epithelialization. He has 2-3+ edema in the leg no signs of a DVT otherwise oo Small wound on the left dorsal foot looks like it is just about closing Integumentary (Hair, Skin) Skin changes of chronic venous insufficiency. Wound #10 status is Open. Original cause of wound was Blister. The date acquired was: 09/12/2021. The wound is located on the Right,Circumferential Lower Leg. The wound measures 9cm length x 22cm width x 0.1cm depth; 155.509cm^2 area and 15.551cm^3 volume. There is Fat Layer (Subcutaneous Tissue) exposed. There is no tunneling or undermining noted. There is a large amount of serous drainage noted. The wound margin is distinct with the outline attached to the wound base. There is medium (34-66%) red granulation within the wound bed. There is a medium (34-66%)  amount of necrotic tissue within the wound bed including Adherent Slough. Wound #9 status is Open. Original cause of wound was Gradually Appeared. The date acquired was: 08/04/2021. The wound has been in treatment 6 weeks. The wound is located on the Left,Dorsal Foot.  The wound measures 0.3cm length x 0.2cm width x 0.1cm depth; 0.047cm^2 area and 0.005cm^3 volume. There is Fat Layer (Subcutaneous Tissue) exposed. There is no tunneling or undermining noted. There is a small amount of serosanguineous drainage noted. The wound margin is distinct with the outline attached to the wound base. There is large (67-100%) red granulation within the wound bed. There is no necrotic tissue within the wound bed. 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 There was a Excisional Skin/Subcutaneous Tissue Debridement with a total area of 45 sq cm performed by Ricard Dillon., MD. With the following instrument(s): Curette to remove Non-Viable tissue/material. Material removed includes Subcutaneous Tissue and Slough and after achieving pain control using Other (Benzocaine). No specimens were taken. A time out was conducted at 14:19, prior to the start of the procedure. A Minimum amount of bleeding was controlled with Pressure. The procedure was tolerated well. Post Debridement Measurements: 9cm length x 22cm width x 0.1cm depth; 15.551cm^3 volume. Character of Wound/Ulcer Post Debridement is stable.  Severity of Tissue Post Debridement is: Fat layer exposed. Post procedure Diagnosis Wound #10: Same as Pre-Procedure Plan Follow-up Appointments: Return Appointment in 1 week. - with Glynn Octave Shower/ Hygiene: May shower with protection but do not get wound dressing(s) wet. Edema Control - Lymphedema / SCD / Other: Elevate legs to the level of the heart or above for 30 minutes daily and/or when sitting, a frequency of: - whenever sitting Avoid standing for long periods of time. Patient to wear own compression stockings every day. - Compression Stocking to right leg: apply in morning and remove at bedtime. Exercise regularly Additional Orders / Instructions: Follow Nutritious Diet - Monitor blood sugars WOUND #10: - Lower Leg Wound Laterality: Right, Circumferential Cleanser: Soap and Water 1 x Per Week/30 Days Discharge Instructions: May shower and wash wound with dial antibacterial soap and water prior to dressing change. Cleanser: Wound Cleanser 1 x Per Week/30 Days Discharge Instructions: Cleanse the wound with wound cleanser prior to applying a clean dressing using gauze sponges, not tissue or cotton balls. Peri-Wound Care: Triamcinolone 15 (g) 1 x Per Week/30 Days Discharge Instructions: Use triamcinolone 15 (g) as directed Peri-Wound Care: Sween Lotion (Moisturizing lotion) 1 x Per Week/30 Days Discharge Instructions: Apply moisturizing lotion as directed Prim Dressing: IODOFLEX 0.9% Cadexomer Iodine Pad 4x6 cm 1 x Per Week/30 Days ary Discharge Instructions: Apply to wound bed as instructed Secondary Dressing: Woven Gauze Sponge, Non-Sterile 4x4 in 1 x Per Week/30 Days Discharge Instructions: Apply over primary dressing as directed. Secondary Dressing: ABD Pad, 8x10 1 x Per Week/30 Days Discharge Instructions: Apply over primary dressing as directed. Com pression Wrap: Kerlix Roll 4.5x3.1 (in/yd) 1 x Per Week/30 Days Discharge Instructions: Apply Kerlix and Coban compression  as directed. Com pression Wrap: Coban Self-Adherent Wrap 4x5 (in/yd) 1 x Per Week/30 Days Discharge Instructions: Apply over Kerlix as directed. WOUND #9: - Foot Wound Laterality: Dorsal, Left Cleanser: Soap and Water 1 x Per Week/15 Days Discharge Instructions: May shower and wash wound with dial antibacterial soap and water prior  to dressing change. Cleanser: Wound Cleanser 1 x Per Week/15 Days Discharge Instructions: Cleanse the wound with wound cleanser prior to applying a clean dressing using gauze sponges, not tissue or cotton balls. Peri-Wound Care: Triamcinolone 15 (g) 1 x Per Week/15 Days Discharge Instructions: Use triamcinolone 15 (g) as directed Peri-Wound Care: Sween Lotion (Moisturizing lotion) 1 x Per Week/15 Days Discharge Instructions: Apply moisturizing lotion as directed Prim Dressing: KerraCel Ag Gelling Fiber Dressing, 2x2 in (silver alginate) 1 x Per Week/15 Days ary Discharge Instructions: Apply silver alginate to wound bed as instructed Secondary Dressing: Woven Gauze Sponge, Non-Sterile 4x4 in 1 x Per Week/15 Days Discharge Instructions: Apply over primary dressing as directed. Com pression Wrap: Kerlix Roll 4.5x3.1 (in/yd) 1 x Per Week/15 Days Discharge Instructions: Apply Kerlix and Coban compression as directed. Com pression Wrap: Coban Self-Adherent Wrap 4x5 (in/yd) 1 x Per Week/15 Days Discharge Instructions: Apply over Kerlix as directed. 1. I put Iodoflex on the large areas on the right anterior lower leg that especially the areas that are necrotic anteriorly. 2. Continue with silver alginate to the left dorsal foot 3. The patient has had significant PAD I gather and although I would have like to wrap the right leg more aggressively I went with kerlix Coban which was being used up to 2 or 3 weeks ago. 4. He will definitely need more aggressive compression perhaps juxta lite stockings when this closes Electronic Signature(s) Signed: 09/15/2021 3:43:46 PM By:  Linton Ham MD Entered By: Linton Ham on 09/15/2021 14:52:24 -------------------------------------------------------------------------------- SuperBill Details Patient Name: Date of Service: [name], [name] 09/15/2021 Medical Record Number: [id number] Patient Account Number: [id number] Date of Birth/Sex: Treating RN: Jan 10, 1936 (86 y.o. [name] Primary Care Provider: [name] Other Clinician: Referring Provider: Treating Provider/Extender: Bryon Lions in Treatment: 8 Diagnosis Coding ICD-10 Codes Code Description [phone] Non-pressure chronic ulcer of other part of right lower leg with other specified severity L97.828 Non-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 leg with other specified severity L97.522 Non-pressure chronic ulcer of other part of left foot with fat layer exposed I87.333 Chronic venous hypertension (idiopathic) with ulcer and inflammation of bilateral lower extremity E11.51 Type 2 diabetes mellitus with diabetic peripheral angiopathy without gangrene Facility Procedures CPT4 Code: [id number] Description: 38937 - DEB SUBQ TISSUE 20 SQ CM/< ICD-10 Diagnosis Description L97.818 Non-pressure chronic ulcer of other part of right lower leg with other specified Modifier: severity Quantity: 1 Physician Procedures : CPT4 Code Description Modifier [id number] 11042 - WC PHYS SUBQ TISS 20 SQ CM ICD-10 Diagnosis Description L97.818 Non-pressure chronic ulcer of other part of right lower leg with other specified severity Quantity: 1 : [id number] 03559 - WC PHYS SUBQ TISS EA ADDL 20 CM ICD-10 Diagnosis Description L97.818 Non-pressure chronic ulcer of other part of right lower leg with other specified severity Quantity: 4 Electronic Signature(s) Signed: 09/15/2021 3:43:46 PM By: Linton Ham MD Entered By: Linton Ham on 09/15/2021 14:52:35

## 2021-09-17 ENCOUNTER — Emergency Department (HOSPITAL_COMMUNITY): Payer: Medicare Other

## 2021-09-17 ENCOUNTER — Inpatient Hospital Stay (HOSPITAL_COMMUNITY)
Admission: EM | Admit: 2021-09-17 | Discharge: 2021-09-22 | DRG: 280 | Disposition: A | Discharge status: Home Health | Payer: Medicare Other | Attending: Internal Medicine | Admitting: Internal Medicine

## 2021-09-17 ENCOUNTER — Encounter (HOSPITAL_COMMUNITY): Payer: Self-pay | Admitting: Emergency Medicine

## 2021-09-17 DIAGNOSIS — R778 Other specified abnormalities of plasma proteins: Secondary | ICD-10-CM | POA: Diagnosis present

## 2021-09-17 DIAGNOSIS — I4891 Unspecified atrial fibrillation: Secondary | ICD-10-CM | POA: Diagnosis present

## 2021-09-17 DIAGNOSIS — Z8249 Family history of ischemic heart disease and other diseases of the circulatory system: Secondary | ICD-10-CM

## 2021-09-17 DIAGNOSIS — L97509 Non-pressure chronic ulcer of other part of unspecified foot with unspecified severity: Secondary | ICD-10-CM | POA: Diagnosis present

## 2021-09-17 DIAGNOSIS — Z01812 Encounter for preprocedural laboratory examination: Secondary | ICD-10-CM

## 2021-09-17 DIAGNOSIS — I509 Heart failure, unspecified: Secondary | ICD-10-CM

## 2021-09-17 DIAGNOSIS — E871 Hypo-osmolality and hyponatremia: Secondary | ICD-10-CM | POA: Diagnosis present

## 2021-09-17 DIAGNOSIS — R079 Chest pain, unspecified: Secondary | ICD-10-CM

## 2021-09-17 DIAGNOSIS — K219 Gastro-esophageal reflux disease without esophagitis: Secondary | ICD-10-CM | POA: Diagnosis present

## 2021-09-17 DIAGNOSIS — L97919 Non-pressure chronic ulcer of unspecified part of right lower leg with unspecified severity: Secondary | ICD-10-CM | POA: Diagnosis present

## 2021-09-17 DIAGNOSIS — I426 Alcoholic cardiomyopathy: Secondary | ICD-10-CM

## 2021-09-17 DIAGNOSIS — I5043 Acute on chronic combined systolic (congestive) and diastolic (congestive) heart failure: Secondary | ICD-10-CM | POA: Diagnosis not present

## 2021-09-17 DIAGNOSIS — Z955 Presence of coronary angioplasty implant and graft: Secondary | ICD-10-CM

## 2021-09-17 DIAGNOSIS — I252 Old myocardial infarction: Secondary | ICD-10-CM

## 2021-09-17 DIAGNOSIS — I69398 Other sequelae of cerebral infarction: Secondary | ICD-10-CM

## 2021-09-17 DIAGNOSIS — I77810 Thoracic aortic ectasia: Secondary | ICD-10-CM | POA: Diagnosis present

## 2021-09-17 DIAGNOSIS — L309 Dermatitis, unspecified: Secondary | ICD-10-CM | POA: Diagnosis present

## 2021-09-17 DIAGNOSIS — E7849 Other hyperlipidemia: Secondary | ICD-10-CM

## 2021-09-17 DIAGNOSIS — Z7982 Long term (current) use of aspirin: Secondary | ICD-10-CM

## 2021-09-17 DIAGNOSIS — H409 Unspecified glaucoma: Secondary | ICD-10-CM | POA: Diagnosis present

## 2021-09-17 DIAGNOSIS — I21A1 Myocardial infarction type 2: Secondary | ICD-10-CM | POA: Diagnosis present

## 2021-09-17 DIAGNOSIS — I4821 Permanent atrial fibrillation: Secondary | ICD-10-CM

## 2021-09-17 DIAGNOSIS — Z87891 Personal history of nicotine dependence: Secondary | ICD-10-CM

## 2021-09-17 DIAGNOSIS — E785 Hyperlipidemia, unspecified: Secondary | ICD-10-CM | POA: Diagnosis present

## 2021-09-17 DIAGNOSIS — J449 Chronic obstructive pulmonary disease, unspecified: Secondary | ICD-10-CM | POA: Diagnosis present

## 2021-09-17 DIAGNOSIS — Z79899 Other long term (current) drug therapy: Secondary | ICD-10-CM

## 2021-09-17 DIAGNOSIS — I480 Paroxysmal atrial fibrillation: Secondary | ICD-10-CM | POA: Diagnosis present

## 2021-09-17 DIAGNOSIS — I6350 Cerebral infarction due to unspecified occlusion or stenosis of unspecified cerebral artery: Secondary | ICD-10-CM | POA: Diagnosis present

## 2021-09-17 DIAGNOSIS — L97929 Non-pressure chronic ulcer of unspecified part of left lower leg with unspecified severity: Secondary | ICD-10-CM | POA: Diagnosis present

## 2021-09-17 DIAGNOSIS — I255 Ischemic cardiomyopathy: Secondary | ICD-10-CM | POA: Diagnosis present

## 2021-09-17 DIAGNOSIS — I110 Hypertensive heart disease with heart failure: Principal | ICD-10-CM | POA: Diagnosis present

## 2021-09-17 DIAGNOSIS — I4819 Other persistent atrial fibrillation: Secondary | ICD-10-CM | POA: Diagnosis present

## 2021-09-17 DIAGNOSIS — I214 Non-ST elevation (NSTEMI) myocardial infarction: Secondary | ICD-10-CM | POA: Diagnosis present

## 2021-09-17 DIAGNOSIS — I272 Pulmonary hypertension, unspecified: Secondary | ICD-10-CM | POA: Diagnosis present

## 2021-09-17 DIAGNOSIS — I5022 Chronic systolic (congestive) heart failure: Secondary | ICD-10-CM | POA: Diagnosis present

## 2021-09-17 DIAGNOSIS — I4729 Other ventricular tachycardia: Secondary | ICD-10-CM | POA: Diagnosis not present

## 2021-09-17 DIAGNOSIS — I2510 Atherosclerotic heart disease of native coronary artery without angina pectoris: Secondary | ICD-10-CM | POA: Diagnosis present

## 2021-09-17 DIAGNOSIS — Z79891 Long term (current) use of opiate analgesic: Secondary | ICD-10-CM

## 2021-09-17 DIAGNOSIS — L089 Local infection of the skin and subcutaneous tissue, unspecified: Secondary | ICD-10-CM | POA: Diagnosis present

## 2021-09-17 DIAGNOSIS — E11621 Type 2 diabetes mellitus with foot ulcer: Secondary | ICD-10-CM | POA: Diagnosis present

## 2021-09-17 DIAGNOSIS — S31109A Unspecified open wound of abdominal wall, unspecified quadrant without penetration into peritoneal cavity, initial encounter: Secondary | ICD-10-CM | POA: Diagnosis present

## 2021-09-17 DIAGNOSIS — I10 Essential (primary) hypertension: Secondary | ICD-10-CM | POA: Diagnosis present

## 2021-09-17 DIAGNOSIS — Z7984 Long term (current) use of oral hypoglycemic drugs: Secondary | ICD-10-CM

## 2021-09-17 DIAGNOSIS — R791 Abnormal coagulation profile: Secondary | ICD-10-CM | POA: Diagnosis present

## 2021-09-17 DIAGNOSIS — I472 Ventricular tachycardia, unspecified: Secondary | ICD-10-CM | POA: Diagnosis not present

## 2021-09-17 DIAGNOSIS — R9439 Abnormal result of other cardiovascular function study: Secondary | ICD-10-CM

## 2021-09-17 DIAGNOSIS — I5023 Acute on chronic systolic (congestive) heart failure: Secondary | ICD-10-CM | POA: Diagnosis present

## 2021-09-17 DIAGNOSIS — F1020 Alcohol dependence, uncomplicated: Secondary | ICD-10-CM | POA: Diagnosis present

## 2021-09-17 DIAGNOSIS — Z20822 Contact with and (suspected) exposure to covid-19: Secondary | ICD-10-CM | POA: Diagnosis present

## 2021-09-17 DIAGNOSIS — Z7901 Long term (current) use of anticoagulants: Secondary | ICD-10-CM

## 2021-09-17 DIAGNOSIS — I081 Rheumatic disorders of both mitral and tricuspid valves: Secondary | ICD-10-CM | POA: Diagnosis present

## 2021-09-17 DIAGNOSIS — M109 Gout, unspecified: Secondary | ICD-10-CM | POA: Diagnosis present

## 2021-09-17 DIAGNOSIS — I872 Venous insufficiency (chronic) (peripheral): Secondary | ICD-10-CM | POA: Diagnosis present

## 2021-09-17 DIAGNOSIS — M21372 Foot drop, left foot: Secondary | ICD-10-CM | POA: Diagnosis present

## 2021-09-17 LAB — TROPONIN I (HIGH SENSITIVITY)
Troponin I (High Sensitivity): 14 ng/L (ref <18)
Troponin I (High Sensitivity): 36 ng/L — ABNORMAL HIGH (ref <18)
Troponin I (High Sensitivity): 724 ng/L (ref <18)

## 2021-09-17 LAB — BASIC METABOLIC PANEL
Anion gap: 7 (ref 5–15)
BUN: 16 mg/dL (ref 8–23)
CO2: 27 mmol/L (ref 22–32)
Calcium: 8.9 mg/dL (ref 8.9–10.3)
Chloride: 104 mmol/L (ref 98–111)
Creatinine, Ser: 1.14 mg/dL (ref 0.61–1.24)
GFR, Estimated: >60 mL/min (ref >60)
Glucose, Bld: 193 mg/dL — ABNORMAL HIGH (ref 70–99)
Potassium: 3.5 mmol/L (ref 3.5–5.1)
Sodium: 138 mmol/L (ref 135–145)

## 2021-09-17 LAB — RESP PANEL BY RT-PCR (FLU A&B, COVID) ARPGX2
Influenza A by PCR: NEGATIVE
Influenza B by PCR: NEGATIVE
SARS Coronavirus 2 by RT PCR: NEGATIVE

## 2021-09-17 LAB — CBC
HCT: 39 % (ref 39.0–52.0)
Hemoglobin: 12.2 g/dL — ABNORMAL LOW (ref 13.0–17.0)
MCH: 26.8 pg (ref 26.0–34.0)
MCHC: 31.3 g/dL (ref 30.0–36.0)
MCV: 85.7 fL (ref 80.0–100.0)
Platelets: 193 10*3/uL (ref 150–400)
RBC: 4.55 MIL/uL (ref 4.22–5.81)
RDW: 17.2 % — ABNORMAL HIGH (ref 11.5–15.5)
WBC: 4.2 10*3/uL (ref 4.0–10.5)
nRBC: 0 % (ref 0.0–0.2)

## 2021-09-17 LAB — D-DIMER, QUANTITATIVE: D-Dimer, Quant: 0.44 ug/mL-FEU (ref 0.00–0.50)

## 2021-09-17 LAB — PROTIME-INR
INR: 1.5 — ABNORMAL HIGH (ref 0.8–1.2)
Prothrombin Time: 18.1 seconds — ABNORMAL HIGH (ref 11.4–15.2)

## 2021-09-17 LAB — CBG MONITORING, ED: Glucose-Capillary: 109 mg/dL — ABNORMAL HIGH (ref 70–99)

## 2021-09-17 LAB — BRAIN NATRIURETIC PEPTIDE: B Natriuretic Peptide: 1017 pg/mL — ABNORMAL HIGH (ref 0.0–100.0)

## 2021-09-17 LAB — GLUCOSE, CAPILLARY: Glucose-Capillary: 111 mg/dL — ABNORMAL HIGH (ref 70–99)

## 2021-09-17 MED ORDER — WARFARIN SODIUM 6 MG PO TABS
6.0000 mg | ORAL_TABLET | Freq: Once | ORAL | Status: AC
  Administered 2021-09-17: 6 mg via ORAL
  Filled 2021-09-17: qty 1

## 2021-09-17 MED ORDER — POTASSIUM CHLORIDE CRYS ER 20 MEQ PO TBCR
40.0000 meq | EXTENDED_RELEASE_TABLET | Freq: Once | ORAL | Status: AC
  Administered 2021-09-17: 40 meq via ORAL
  Filled 2021-09-17: qty 2

## 2021-09-17 MED ORDER — BRIMONIDINE TARTRATE 0.2 % OP SOLN
1.0000 [drp] | Freq: Two times a day (BID) | OPHTHALMIC | Status: DC
  Administered 2021-09-17 – 2021-09-22 (×10): 1 [drp] via OPHTHALMIC
  Filled 2021-09-17: qty 5

## 2021-09-17 MED ORDER — ACETAMINOPHEN 500 MG PO TABS: 500.0000 mg | ORAL_TABLET | Freq: Three times a day (TID) | ORAL | Status: DC | PRN

## 2021-09-17 MED ORDER — INSULIN ASPART 100 UNIT/ML IJ SOLN
0.0000 [IU] | Freq: Three times a day (TID) | INTRAMUSCULAR | Status: DC
  Administered 2021-09-20: 2 [IU] via SUBCUTANEOUS
  Administered 2021-09-20: 1 [IU] via SUBCUTANEOUS
  Administered 2021-09-21 (×2): 2 [IU] via SUBCUTANEOUS
  Administered 2021-09-21 – 2021-09-22 (×3): 1 [IU] via SUBCUTANEOUS

## 2021-09-17 MED ORDER — NITROGLYCERIN 0.4 MG/SPRAY TL SOLN
1.0000 | Status: DC | PRN
  Filled 2021-09-17: qty 12

## 2021-09-17 MED ORDER — ONDANSETRON HCL 4 MG PO TABS: 4.0000 mg | ORAL_TABLET | Freq: Four times a day (QID) | ORAL | Status: DC | PRN

## 2021-09-17 MED ORDER — ATORVASTATIN CALCIUM 40 MG PO TABS
40.0000 mg | ORAL_TABLET | Freq: Every day | ORAL | Status: DC
  Administered 2021-09-17 – 2021-09-22 (×6): 40 mg via ORAL
  Filled 2021-09-17 (×6): qty 1

## 2021-09-17 MED ORDER — ISOSORBIDE MONONITRATE ER 30 MG PO TB24
30.0000 mg | ORAL_TABLET | Freq: Every day | ORAL | Status: DC
  Administered 2021-09-17 – 2021-09-18 (×2): 30 mg via ORAL
  Filled 2021-09-17 (×2): qty 1

## 2021-09-17 MED ORDER — ASPIRIN 325 MG PO TABS
325.0000 mg | ORAL_TABLET | Freq: Every day | ORAL | Status: DC
  Administered 2021-09-18 – 2021-09-19 (×2): 325 mg via ORAL
  Filled 2021-09-17 (×3): qty 1

## 2021-09-17 MED ORDER — WARFARIN - PHARMACIST DOSING INPATIENT: Freq: Every day | Status: DC

## 2021-09-17 MED ORDER — ASPIRIN 81 MG PO CHEW: 81.0000 mg | CHEWABLE_TABLET | Freq: Every day | ORAL | Status: DC

## 2021-09-17 MED ORDER — EMPAGLIFLOZIN 25 MG PO TABS
25.0000 mg | ORAL_TABLET | Freq: Every day | ORAL | Status: DC
Start: 2021-09-17 — End: 2021-09-22
  Administered 2021-09-18 – 2021-09-22 (×5): 25 mg via ORAL
  Filled 2021-09-17 (×5): qty 1

## 2021-09-17 MED ORDER — CARVEDILOL 3.125 MG PO TABS
3.1250 mg | ORAL_TABLET | Freq: Two times a day (BID) | ORAL | Status: DC
  Administered 2021-09-17 – 2021-09-22 (×8): 3.125 mg via ORAL
  Filled 2021-09-17 (×10): qty 1

## 2021-09-17 MED ORDER — HEPARIN (PORCINE) 25000 UT/250ML-% IV SOLN
1050.0000 [IU]/h | INTRAVENOUS | Status: DC
  Administered 2021-09-18 – 2021-09-19 (×2): 1050 [IU]/h via INTRAVENOUS
  Filled 2021-09-17 (×3): qty 250

## 2021-09-17 MED ORDER — TRAMADOL HCL 50 MG PO TABS
50.0000 mg | ORAL_TABLET | Freq: Three times a day (TID) | ORAL | Status: DC | PRN
  Administered 2021-09-20 – 2021-09-22 (×2): 50 mg via ORAL
  Filled 2021-09-17 (×2): qty 1

## 2021-09-17 MED ORDER — SPIRONOLACTONE 25 MG PO TABS
25.0000 mg | ORAL_TABLET | Freq: Every day | ORAL | Status: DC
  Administered 2021-09-17 – 2021-09-20 (×4): 25 mg via ORAL
  Filled 2021-09-17 (×4): qty 1

## 2021-09-17 MED ORDER — FUROSEMIDE 10 MG/ML IJ SOLN
40.0000 mg | Freq: Once | INTRAMUSCULAR | Status: AC
  Administered 2021-09-17: 40 mg via INTRAVENOUS
  Filled 2021-09-17: qty 4

## 2021-09-17 MED ORDER — ONDANSETRON HCL 4 MG/2ML IJ SOLN: 4.0000 mg | Freq: Four times a day (QID) | INTRAMUSCULAR | Status: DC | PRN

## 2021-09-17 MED ORDER — FUROSEMIDE 10 MG/ML IJ SOLN
40.0000 mg | Freq: Two times a day (BID) | INTRAMUSCULAR | Status: DC
  Administered 2021-09-17 – 2021-09-19 (×4): 40 mg via INTRAVENOUS
  Filled 2021-09-17 (×4): qty 4

## 2021-09-17 MED ORDER — ASPIRIN 81 MG PO CHEW
324.0000 mg | CHEWABLE_TABLET | Freq: Once | ORAL | Status: AC
  Administered 2021-09-17: 324 mg via ORAL
  Filled 2021-09-17: qty 4

## 2021-09-17 MED ORDER — TAMSULOSIN HCL 0.4 MG PO CAPS
0.4000 mg | ORAL_CAPSULE | Freq: Every day | ORAL | Status: DC
  Administered 2021-09-17 – 2021-09-21 (×5): 0.4 mg via ORAL
  Filled 2021-09-17 (×5): qty 1

## 2021-09-17 NOTE — ED Notes (Signed)
Notified Dr. [name] of critical troponin of 724.

## 2021-09-17 NOTE — ED Provider Triage Note (Signed)
Emergency Medicine Provider Triage Evaluation Note  Vincent [name] , a 86 y.o. male  was evaluated in triage.  Pt complains of chest pain starting at 7am this morning. Patient tried 2 doses of spray nitro without relief. He describes his pain as a constant dull pressure in the middle of his chest. His pain started while walking around his house. He felt fine leading up until today.   Review of Systems  Positive: Chest pain, SOB Negative: Fevers, chills, cough, nausea  Physical Exam  BP 136/88 (BP Location: Right Arm)    Pulse 99    Temp 97.8 F (36.6 C) (Oral)    Resp 16    SpO2 97%  Gen:   Awake, no distress   Resp:  Normal effort  MSK:   Moves extremities without difficulty  Other:    Medical Decision Making  Medically screening exam initiated at 9:50 AM.  Appropriate orders placed.  Vincent [name] was informed that the remainder of the evaluation will be completed by another provider, this initial triage assessment does not replace that evaluation, and the importance of remaining in the ED until their evaluation is complete.     Kateri Plummer, PA-C 09/17/21 9244

## 2021-09-17 NOTE — ED Provider Notes (Signed)
[facility] EMERGENCY DEPARTMENT Provider Note   CSN: [id number] Arrival date & time: 09/17/21  1941     History  Chief Complaint  Patient presents with   Chest Pain    Vincent [name] is a 86 y.o. male.   Chest Pain Associated symptoms: no fever    Patient has a history of hypertension, stroke, hyperlipidemia, coronary artery disease status post cardiac stents, alcoholic cardiomyopathy, pulmonary eosinophilia, chronic anticoagulation use, chronic peripheral edema, COPD, nonsustained V. tach and atrial fibrillation.  Patient presents with complaints of chest pain.  Patient states his symptoms started this morning when he was walking around his house.  It is a dull pain in the center of his chest.  It radiates more to the right side.  He is also feeling more short of breath than usual.  He tried taking nitroglycerin without any acute changes.  He denies any fevers or chills.  He has not had any coughing.  Patient does have chronic leg swelling and goes to the wound care center but has not noticed any acute changes with that.  Patient denies any chest pain at this time  Home Medications Prior to Admission medications   Medication Sig Start Date End Date Taking? Authorizing Provider  ACCU-CHEK FASTCLIX LANCETS MISC USE TO CHECK BLOOD SUGAR QD 06/05/18   [provider]  ACCU-CHEK GUIDE test strip USE TO CHECK BLOOD SUGAR QD 06/07/18   [provider]  acetaminophen (TYLENOL) 500 MG tablet Take 1 tablet (500 mg total) by mouth every 8 (eight) hours as needed for mild pain or moderate pain. 09/15/19   Zigmund Gottron, NP  acetaminophen (TYLENOL) 500 MG tablet Take 2 tablets (1,000 mg total) by mouth every 6 (six) hours as needed. 02/21/21   [name], MD  aspirin 81 MG chewable tablet Chew 81 mg by mouth daily.    [provider]  atorvastatin (LIPITOR) 40 MG tablet TAKE 1 TABLET(40 MG) BY MOUTH DAILY 11/16/20   [name], MD   brimonidine (ALPHAGAN) 0.2 % ophthalmic solution Place 1 drop into both eyes 2 (two) times daily. 07/30/16   [provider]  calcium-vitamin D (OSCAL WITH D) 250-125 MG-UNIT tablet Take 1 tablet by mouth daily.    [provider]  carvedilol (COREG) 6.25 MG tablet TAKE 1 TABLET(6.25 MG) BY MOUTH TWICE DAILY WITH A MEAL 07/13/20   [name], MD  colchicine 0.6 MG tablet Take 0.6 mg by mouth as needed.     [provider]  Continuous Blood Gluc Sensor (FREESTYLE LIBRE 14 DAY SENSOR) MISC USE AS DIRECTED EVERY 14 DAYS. 03/05/19   [provider]  diclofenac Sodium (VOLTAREN) 1 % GEL Apply 2 g topically 4 (four) times daily. As needed on painful areas of left foot 08/25/20   [name], [name], PA-C  doxycycline (VIBRAMYCIN) 100 MG capsule Take 1 capsule (100 mg total) by mouth 2 (two) times daily. One po bid x 7 days 02/21/21   [name], MD  famotidine (PEPCID) 20 MG tablet Take 20 mg by mouth 2 (two) times daily.    [provider]  ferrous sulfate 325 (65 FE) MG tablet Take 325 mg by mouth daily with breakfast.    [provider]  furosemide (LASIX) 20 MG tablet TAKE 1 TABLET(20 MG) BY MOUTH DAILY 08/07/20   [name], MD  hydrocortisone cream 1 % Apply 1 application topically 2 (two) times daily as needed (for eczmea).  [provider]  ipratropium (ATROVENT) 0.06 % nasal spray Place 2 sprays into both nostrils 4 (four) times daily. Patient taking differently: Place 2 sprays into both nostrils as needed. 11/21/16   [name], NP  irbesartan (AVAPRO) 300 MG tablet TAKE 1 TABLET BY MOUTH EVERY NIGHT AT BEDTIME 03/23/20   [name], MD  isosorbide mononitrate (IMDUR) 30 MG 24 hr tablet Take 1 tablet (30 mg total) by mouth daily. 09/25/20   [name], MD  JARDIANCE 25 MG TABS tablet Take 25 mg by mouth daily. 02/04/20   [provider]  latanoprost (XALATAN) 0.005 % ophthalmic  solution Place 1 drop into both eyes at bedtime.    [provider]  loratadine (CLARITIN) 10 MG tablet Take 10 mg by mouth daily.    [provider]  magnesium oxide (MAG-OX) 400 MG tablet TAKE 1 TABLET(400 MG) BY MOUTH TWICE DAILY 11/10/20   [name], MD  meclizine (ANTIVERT) 25 MG tablet Take 25 mg by mouth 3 (three) times daily as needed for dizziness.    [provider]  metFORMIN (GLUCOPHAGE) 500 MG tablet Take 500 mg by mouth 2 (two) times daily. 06/16/19   [provider]  Multiple Vitamins-Minerals (MULTIVITAMIN WITH MINERALS) tablet Take 1 tablet by mouth in the morning and at bedtime.    [provider]  mupirocin cream (BACTROBAN) 2 % Apply 1 application topically 2 (two) times daily. Apply ointment twice daily to both interactive wounds on your leg. 02/21/21   [name], MD  nitroGLYCERIN (NITROLINGUAL) 0.4 MG/SPRAY spray Place 1 spray under the tongue every 5 (five) minutes x 3 doses as needed for chest pain. Please call and schedule an appointment 04/20/21   Imogene Burn, PA-C  Omega-3 Fatty Acids (FISH OIL) 1000 MG CAPS Take 1,000 mg by mouth 2 (two) times daily.     [provider]  ROCKLATAN 0.02-0.005 % SOLN Apply 1 drop to eye at bedtime. 02/03/20   [provider]  spironolactone (ALDACTONE) 25 MG tablet TAKE 1 TABLET(25 MG) BY MOUTH DAILY 09/22/20   [name], MD  Tamsulosin HCl (FLOMAX) 0.4 MG CAPS Take 0.4 mg by mouth daily. 12/28/11   [provider]  traMADol (ULTRAM) 50 MG tablet Take 1 tablet (50 mg total) by mouth 3 (three) times daily as needed. 06/01/20   [employer], [place] Name: pt reports using gold bond as needed and multiple vitamin supplements.    [provider]  Vibegron (GEMTESA) 75 MG TABS Take by mouth.    [provider]  warfarin (COUMADIN) 2 MG tablet TAKE 2 TO 3 TABLETS BY MOUTH EVERY DAY AS DIRECTED BY COUMADIN CLINIC  06/21/21   Deboraha Sprang, MD      Allergies    Patient has no known allergies.    Review of Systems   Review of Systems  Constitutional:  Negative for fever.  Cardiovascular:  Positive for chest pain.   Physical Exam Updated Vital Signs BP 117/67 (BP Location: Right Arm)    Pulse 70    Temp 97.8 F (36.6 C) (Oral)    Resp 16    SpO2 99%  Physical Exam Vitals and nursing note reviewed.  Constitutional:      General: He is not in acute distress.    Appearance: He is well-developed.  HENT:     Head: Normocephalic and atraumatic.     Right Ear: External ear normal.  Left Ear: External ear normal.  Eyes:     General: No scleral icterus.       Right eye: No discharge.        Left eye: No discharge.     Conjunctiva/sclera: Conjunctivae normal.  Neck:     Trachea: No tracheal deviation.  Cardiovascular:     Rate and Rhythm: Normal rate and regular rhythm.  Pulmonary:     Effort: Pulmonary effort is normal. No respiratory distress.     Breath sounds: Normal breath sounds. No stridor. No wheezing or rales.  Abdominal:     General: Bowel sounds are normal. There is no distension.     Palpations: Abdomen is soft.     Tenderness: There is no abdominal tenderness. There is no guarding or rebound.  Musculoskeletal:        General: No tenderness or deformity.     Cervical back: Neck supple.     Right lower leg: Edema present.     Left lower leg: Edema present.     Comments: Legs wrapped, exam limited as patient has his pants on and he is in the hallway  Skin:    General: Skin is warm and dry.     Findings: No rash.  Neurological:     General: No focal deficit present.     Mental Status: He is alert.     Cranial Nerves: No cranial nerve deficit (no facial droop, extraocular movements intact, no slurred speech).     Sensory: No sensory deficit.     Motor: No abnormal muscle tone or seizure activity.     Coordination: Coordination normal.  Psychiatric:        Mood and  Affect: Mood normal.    ED Results / Procedures / Treatments   Labs (all labs ordered are listed, but only abnormal results are displayed) Labs Reviewed  BASIC METABOLIC PANEL - Abnormal; Notable for the following components:      Result Value   Glucose, Bld 193 (*)    All other components within normal limits  CBC - Abnormal; Notable for the following components:   Hemoglobin 12.2 (*)    RDW 17.2 (*)    All other components within normal limits  PROTIME-INR - Abnormal; Notable for the following components:   Prothrombin Time 18.1 (*)    INR 1.5 (*)    All other components within normal limits  BRAIN NATRIURETIC PEPTIDE - Abnormal; Notable for the following components:   B Natriuretic Peptide 1,017.0 (*)    All other components within normal limits  TROPONIN I (HIGH SENSITIVITY) - Abnormal; Notable for the following components:   Troponin I (High Sensitivity) 36 (*)    All other components within normal limits  RESP PANEL BY RT-PCR (FLU A&B, COVID) ARPGX2  D-DIMER, QUANTITATIVE  TROPONIN I (HIGH SENSITIVITY)    EKG EKG Interpretation  Date/Time:  Friday September 17 2021 09:51:08 EST Ventricular Rate:  104 PR Interval:    QRS Duration: 118 QT Interval:  352 QTC Calculation: 462 R Axis:   54 Text Interpretation: Atrial fibrillation with rapid ventricular response Non-specific intra-ventricular conduction delay Minimal voltage criteria for LVH, may be normal variant ( Cornell product ) T wave abnormality, consider lateral ischemia Abnormal ECG When compared with ECG of 21-Feb-2021 12:12,  rate faster Confirmed by Dorie Rank [id number]) on 09/17/2021 12:55:34 PM  Radiology DG Chest 2 View  Result Date: 09/17/2021 CLINICAL DATA:  Patient with chest pain. EXAM: CHEST - 2 VIEW COMPARISON:  Chest radiograph 02/21/2021. FINDINGS: Stable cardiomegaly. No large area pulmonary consolidation. Minimal basilar atelectasis on the lateral view. Suggestion of a possible small effusion. No  pneumothorax. Thoracic spine degenerative changes. IMPRESSION: Cardiomegaly.  Possible small effusions. Electronically Signed   By: Lovey Newcomer M.D.   On: 09/17/2021 10:12    Procedures Procedures    Medications Ordered in ED Medications  furosemide (LASIX) injection 40 mg (has no administration in time range)  potassium chloride SA (KLOR-CON M) CR tablet 40 mEq (has no administration in time range)  aspirin chewable tablet 324 mg (324 mg Oral Given 09/17/21 1324)    ED Course/ Medical Decision Making/ A&P Clinical Course as of 09/17/21 1458  Fri Sep 17, 2021  1253 CBC(!) CBC normal [JK]  6503 Basic metabolic panel(!) Metabolic panel with hyperglycemia, no signs of acidosis or electrolyte abnormalities [JK]  1254 Troponin I (High Sensitivity) Initial troponin normal at 14 [JK]  1254 Chest x-ray images and radiology report reviewed.  Cardiomegaly and small effusions [JK]  1312 Troponin increased from 14 to 36 [JK]  1450 BNP is elevated at 1000.  D-dimer is normal 0.44 [JK]  1458 Discussed case with Dr. [name] regarding admission [JK]    Clinical Course User Index [JK] Dorie Rank, MD                           Medical Decision Making Amount and/or Complexity of Data Reviewed Labs: ordered. Decision-making details documented in ED Course.  Risk OTC drugs. Prescription drug management. Decision regarding hospitalization.   CHest pain History of coronary artery disease.  EKG without signs of acute ischemia.  Patient has a slight increase in his troponin.  We will need to continue to monitor.  X-ray without signs of pneumonia.  Findings do suggest possibility of CHF  Shortness of breath Patient's D-dimer is negative.  Doubt PE.  COVID and flu are also negative.  Labs are notable for elevated BNP and troponin.  Suspect symptoms are related to CHF exacerbation.  IV Lasix ordered.  I will consult the medical service admission considering his chest pain troponins        Final  Clinical Impression(s) / ED Diagnoses Final diagnoses:  Acute on chronic congestive heart failure, unspecified heart failure type ([place])  Chest pain, unspecified type  Elevated troponin      Dorie Rank, MD 09/17/21 1459

## 2021-09-17 NOTE — Progress Notes (Deleted)
Cardiology Office Note:   Date:  09/17/2021  NAME:  Vincent [name]    MRN: [id number] DOB:  [birth year]   PCP:  [name], MD  Cardiologist:  Ena Dawley, MD  Electrophysiologist:  Virl Axe, MD   Referring MD: [name], MD   No chief complaint on file. ***  History of Present Illness:   Vincent [name] is a 86 y.o. male with a hx of *** who is being seen today for the evaluation of *** at the request of ***.  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 s/p remote stent to Cx 2004 with chronic stable angina in context of residual circumflex disease.   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/25/2015   NSVT (nonsustained ventricular tachycardia)    Permanent atrial fibrillation (HCC)    Pulmonary eosinophilia (HCC)    Sinus bradycardia    Stroke (HCC)    Syncope and collapse    Unspecified glaucoma(365.9)     Past Surgical History: Past Surgical History:  Procedure Laterality Date   CARDIAC CATHETERIZATION N/A 07/30/2015   Procedure: Left Heart Cath and Coronary Angiography;  Surgeon: Belva Crome, MD;  Location: [facility];  Service: Cardiovascular;  Laterality: N/A;   COLONOSCOPY     KNEE SURGERY     loop recorder  09/2007    Current Medications: No outpatient medications have been marked as taking for the 09/21/21 encounter (Appointment) with O'Neal, Cassie Freer, MD.     Allergies:    Patient has  no known allergies.   Social History: Social History   Socioeconomic History   Marital status: Divorced    Spouse name: Not on file   Number of children: 0   Years of education: master's   Highest education level: Not on file  Occupational History   Occupation: semi-retired  Tobacco Use   Smoking status: Former    Types: Cigarettes    Quit date: 08/22/1983    Years since quitting: 38.0   Smokeless tobacco: Never  Vaping Use   Vaping Use: Never used  Substance and Sexual Activity   Alcohol use: No   Drug use: No   Sexual activity: Not on file  Other Topics Concern   Not on file  Social History Narrative   Patient drinks 1-2 cups of caffeine daily.   Patient is right handed.   Social Determinants of Health   Financial Resource Strain: Not on file  Food Insecurity: Not on file  Transportation Needs: Not on file  Physical Activity: Not on file  Stress: Not on file  Social Connections: Not on file     Family History: The patient's ***family history includes Cancer in his brother, mother, and sister; Colon cancer in his mother; Heart attack in his father and sister; Heart disease in his father; Hypertension in his father and mother; Prostate cancer in  his brother. There is no history of Stroke, Colon polyps, Esophageal cancer, Stomach cancer, Rectal cancer, or Pancreatic cancer.  ROS:   All other ROS reviewed and negative. Pertinent positives noted in the HPI.     EKGs/Labs/Other Studies Reviewed:   The following studies were personally reviewed by me today:  EKG:  EKG is *** ordered today.  The ekg ordered today demonstrates ***, and was personally reviewed by me.   Recent Labs: 09/17/2021: B Natriuretic Peptide 1,017.0; BUN 16; Creatinine, Ser 1.14; Hemoglobin 12.2; Platelets 193; Potassium 3.5; Sodium 138   Recent Lipid Panel    Component Value Date/Time   CHOL 156 11/28/2017 1201   TRIG 160 (H) 11/28/2017 1201   HDL 52 11/28/2017 1201   CHOLHDL 3.0 11/28/2017  1201   CHOLHDL 2.6 04/27/2016 1208   VLDL 25 04/27/2016 1208   LDLCALC 72 11/28/2017 1201    Physical Exam:   VS:  There were no vitals taken for this visit.   Wt Readings from Last 3 Encounters:  08/11/20 179 lb 14.4 oz (81.6 kg)  08/05/20 179 lb (81.2 kg)  09/15/19 192 lb (87.1 kg)    General: Well nourished, well developed, in no acute distress Head: Atraumatic, normal size  Eyes: PEERLA, EOMI  Neck: Supple, no JVD Endocrine: No thryomegaly Cardiac: Normal S1, S2; RRR; no murmurs, rubs, or gallops Lungs: Clear to auscultation bilaterally, no wheezing, rhonchi or rales  Abd: Soft, nontender, no hepatomegaly  Ext: No edema, pulses 2+ Musculoskeletal: No deformities, BUE and BLE strength normal and equal Skin: Warm and dry, no rashes   Neuro: Alert and oriented to person, place, time, and situation, CNII-XII grossly intact, no focal deficits  Psych: Normal mood and affect   ASSESSMENT:   Vincent [name] is a 86 y.o. male who presents for the following: No diagnosis found.  PLAN:   There are no diagnoses linked to this encounter.  {Are you ordering a CV Procedure (e.g. stress test, cath, DCCV, TEE, etc)?   Press F2        :[id number]}  Disposition: No follow-ups on file.  Medication Adjustments/Labs and Tests Ordered: Current medicines are reviewed at length with the patient today.  Concerns regarding medicines are outlined above.  No orders of the defined types were placed in this encounter.  No orders of the defined types were placed in this encounter.   There are no Patient Instructions on file for this visit.   Signed, Addison Naegeli. Audie Box, MD, [facility]  [address] [place], [place] [address] [phone]  09/17/2021 4:47 PM

## 2021-09-17 NOTE — Progress Notes (Signed)
Overnight progress note  High-sensitivity troponin trending up 14 >36 >724 >1360.  Patient is not endorsing any chest pain or shortness of breath at this time.  He already received full dose aspirin.  He is on Coumadin for A. fib but his INR is subtherapeutic at 1.5.  Discussed with on-call cardiologist Dr. Alfred Levins, he recommends stopping Coumadin and switching to IV heparin per ACS protocol.  Since the patient is not symptomatic at this time, no plan for urgent cardiac catheterization tonight.  Cardiac catheterization will likely be done after this weekend.  He recommends consulting cardiology team in the daytime. Will continue to trend troponin.

## 2021-09-17 NOTE — ED Triage Notes (Signed)
Patient complains of diffuse dull chest pain and shortness of breath that started while walking around his house this morning. Patient states he used 2x nitroglycerin sprays this morning with no change in pain. Patient alert, oriented, and states he feels like he is breathing normally, but still feels short of breath. Room air SpO2 97%.

## 2021-09-17 NOTE — H&P (Signed)
History and Physical    Vincent [name]  DOB: 08/14/1936 DOA: 09/17/2021  Referring MD/NP/PA: EDP PCP:  Patient coming from: Home  Chief Complaint:, Shortness of breath, chest pain  HPI: [name] STATES is a/M with history of CAD, PCI and stenting to circumflex in 8676, chronic systolic CHF, stable angina, alcoholic cardiomyopathy, paroxysmal atrial fibrillation on Coumadin, type 2 diabetes mellitus, history of CVA, left foot drop, chronic wounds followed at the wound center today with multiple complaints, patient reports increasing shortness of breath over the past 4 to 5 days, and episodes of chest pain, which felt worse than his usual without radiation, occasionally happening at rest and sometimes with activity. -Patient reports taking all his medicines regularly for the most part, most often takes has missed this on multiple occasions ED Course: EKG noted LVH with repolarization abnormality, high-sensitivity troponin was 238, BNP was elevated at 1017, D-dimer was 0.4, INR subtherapeutic at 1.5, chest x-ray noted cardiomegaly and small pleural effusions  Review of Systems: As per HPI otherwise 16 chronically ill elderly male, point review of systems negative.   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 s/p remote stent to Cx 2004 with chronic stable angina in context of residual circumflex disease.   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/25/2015   NSVT  (nonsustained ventricular tachycardia)    Permanent atrial fibrillation (HCC)    Pulmonary eosinophilia (HCC)    Sinus bradycardia    Stroke [name])    Syncope and collapse    Unspecified glaucoma(365.9)     Past Surgical History:  Procedure Laterality Date   CARDIAC CATHETERIZATION N/A 07/30/2015   Procedure: Left Heart Cath and Coronary Angiography;  Surgeon: Belva Crome, MD;  Location: [place] [facility];  Service: Cardiovascular;  Laterality: N/A;   COLONOSCOPY     KNEE SURGERY     loop recorder  09/2007     reports that he quit smoking about 38 years ago. He has never used smokeless tobacco. He reports that he does not drink alcohol and does not use drugs.  No Known Allergies  Family History  Problem Relation Age of Onset   Colon cancer Mother    Hypertension Mother    Cancer Mother    Prostate cancer Brother    Cancer Brother    Heart disease Father    Heart attack Father    Hypertension Father    Heart attack Sister    Cancer Sister    Stroke Neg Hx    Colon polyps Neg Hx    Esophageal cancer Neg Hx    Stomach cancer Neg Hx    Rectal cancer Neg Hx    Pancreatic cancer Neg Hx      Prior to Admission medications   Medication Sig Start Date End Date Taking? Authorizing Provider  ACCU-CHEK FASTCLIX LANCETS MISC USE TO CHECK BLOOD SUGAR QD 06/05/18  [provider]  ACCU-CHEK GUIDE test strip USE TO CHECK BLOOD SUGAR QD 06/07/18   [provider]  acetaminophen (TYLENOL) 500 MG tablet Take 1 tablet (500 mg total) by mouth every 8 (eight) hours as needed for mild pain or moderate pain. 09/15/19   Zigmund Gottron, NP  acetaminophen (TYLENOL) 500 MG tablet Take 2 tablets (1,000 mg total) by mouth every 6 (six) hours as needed. 02/21/21   [name], MD  aspirin 81 MG chewable tablet Chew 81 mg by mouth daily.    [provider]  atorvastatin (LIPITOR) 40 MG tablet TAKE 1 TABLET(40 MG) BY MOUTH DAILY 11/16/20   [name], MD   brimonidine (ALPHAGAN) 0.2 % ophthalmic solution Place 1 drop into both eyes 2 (two) times daily. 07/30/16   [provider]  calcium-vitamin D (OSCAL WITH D) 250-125 MG-UNIT tablet Take 1 tablet by mouth daily.    [provider]  carvedilol (COREG) 6.25 MG tablet TAKE 1 TABLET(6.25 MG) BY MOUTH TWICE DAILY WITH A MEAL 07/13/20   [name], MD  colchicine 0.6 MG tablet Take 0.6 mg by mouth as needed.     [provider]  Continuous Blood Gluc Sensor (FREESTYLE LIBRE 14 DAY SENSOR) MISC USE AS DIRECTED EVERY 14 DAYS. 03/05/19   [provider]  diclofenac Sodium (VOLTAREN) 1 % GEL Apply 2 g topically 4 (four) times daily. As needed on painful areas of left foot 08/25/20   [name], [name], PA-C  doxycycline (VIBRAMYCIN) 100 MG capsule Take 1 capsule (100 mg total) by mouth 2 (two) times daily. One po bid x 7 days 02/21/21   [name], MD  famotidine (PEPCID) 20 MG tablet Take 20 mg by mouth 2 (two) times daily.    [provider]  ferrous sulfate 325 (65 FE) MG tablet Take 325 mg by mouth daily with breakfast.    [provider]  furosemide (LASIX) 20 MG tablet TAKE 1 TABLET(20 MG) BY MOUTH DAILY 08/07/20   [name], MD  hydrocortisone cream 1 % Apply 1 application topically 2 (two) times daily as needed (for eczmea).    [provider]  ipratropium (ATROVENT) 0.06 % nasal spray Place 2 sprays into both nostrils 4 (four) times daily. Patient taking differently: Place 2 sprays into both nostrils as needed. 11/21/16   [name], NP  irbesartan (AVAPRO) 300 MG tablet TAKE 1 TABLET BY MOUTH EVERY NIGHT AT BEDTIME 03/23/20   [name], MD  isosorbide mononitrate (IMDUR) 30 MG 24 hr tablet Take 1 tablet (30 mg total) by mouth daily. 09/25/20   [name], MD  JARDIANCE 25 MG TABS tablet Take 25 mg by mouth daily. 02/04/20   [provider]  latanoprost (XALATAN) 0.005 % ophthalmic  solution Place 1 drop into both eyes at bedtime.    [provider]  loratadine (CLARITIN) 10 MG tablet Take 10 mg by mouth daily.    [provider]  magnesium oxide (MAG-OX) 400 MG tablet TAKE 1 TABLET(400 MG) BY MOUTH TWICE DAILY 11/10/20   [name], MD  meclizine (ANTIVERT) 25 MG tablet Take 25 mg by mouth 3 (three) times daily as needed for dizziness.    [provider]  metFORMIN (GLUCOPHAGE) 500 MG tablet Take 500 mg by mouth 2 (two) times daily. 06/16/19   [provider]  Multiple Vitamins-Minerals (MULTIVITAMIN WITH MINERALS) tablet Take 1 tablet by mouth in the morning and at bedtime.  [provider]  mupirocin cream (BACTROBAN) 2 % Apply 1 application topically 2 (two) times daily. Apply ointment twice daily to both interactive wounds on your leg. 02/21/21   [name], MD  nitroGLYCERIN (NITROLINGUAL) 0.4 MG/SPRAY spray Place 1 spray under the tongue every 5 (five) minutes x 3 doses as needed for chest pain. Please call and schedule an appointment 04/20/21   Imogene Burn, PA-C  Omega-3 Fatty Acids (FISH OIL) 1000 MG CAPS Take 1,000 mg by mouth 2 (two) times daily.     [provider]  ROCKLATAN 0.02-0.005 % SOLN Apply 1 drop to eye at bedtime. 02/03/20   [provider]  spironolactone (ALDACTONE) 25 MG tablet TAKE 1 TABLET(25 MG) BY MOUTH DAILY 09/22/20   [name], MD  Tamsulosin HCl (FLOMAX) 0.4 MG CAPS Take 0.4 mg by mouth daily. 12/28/11   [provider]  traMADol (ULTRAM) 50 MG tablet Take 1 tablet (50 mg total) by mouth 3 (three) times daily as needed. 06/01/20   Volney American, [place] Name: pt reports using gold bond as needed and multiple vitamin supplements.    [provider]  Vibegron (GEMTESA) 75 MG TABS Take by mouth.    [provider]  warfarin (COUMADIN) 2 MG tablet TAKE 2 TO 3 TABLETS BY MOUTH EVERY DAY AS DIRECTED BY COUMADIN CLINIC  06/21/21   Deboraha Sprang, MD    Physical Exam: Vitals:   09/17/21 0948 09/17/21 1321 09/17/21 1503  BP: 136/88 117/67 120/75  Pulse: 99 70 79  Resp: 16 16 16   Temp: 97.8 F (36.6 C)    TempSrc: Oral    SpO2: 97% 99% 99%      Constitutional: AAOx3, no distress HEENT:+ JVD Respiratory: Poor air movement bilaterally, decreased breath sounds to bases CVS: S1-S2, irregularly irregular rhythm Soft, nontender, bowel sounds present Extremities: Both lower legs wrapped with [place]na boots, they were not unwrapped as pt was in the Ed hallway, 1+ edema RLE Skin: As above neuro" Mild left deficits-chronic  Labs on Admission: I have personally reviewed following labs and imaging studies  CBC: Recent Labs  Lab 09/17/21 0952  WBC 4.2  HGB 12.2*  HCT 39.0  MCV 85.7  PLT 423   Basic Metabolic Panel: Recent Labs  Lab 09/17/21 0952  NA 138  K 3.5  CL 104  CO2 27  GLUCOSE 193*  BUN 16  CREATININE 1.14  CALCIUM 8.9   GFR: CrCl cannot be calculated (Unknown ideal weight.). Liver Function Tests: No results for input(s): AST, ALT, ALKPHOS, BILITOT, PROT, ALBUMIN in the last 168 hours. No results for input(s): LIPASE, AMYLASE in the last 168 hours. No results for input(s): AMMONIA in the last 168 hours. Coagulation Profile: Recent Labs  Lab 09/17/21 1317  INR 1.5*   Cardiac Enzymes: No results for input(s): CKTOTAL, CKMB, CKMBINDEX, TROPONINI in the last 168 hours. BNP (last 3 results) No results for input(s): PROBNP in the last 8760 hours. HbA1C: No results for input(s): HGBA1C in the last 72 hours. CBG: No results for input(s): GLUCAP in the last 168 hours. Lipid Profile: No results for input(s): CHOL, HDL, LDLCALC, TRIG, CHOLHDL, LDLDIRECT in the last 72 hours. Thyroid Function Tests: No results for input(s): TSH, T4TOTAL, FREET4, T3FREE, THYROIDAB in the last 72 hours. Anemia Panel: No results for input(s): VITAMINB12, FOLATE, FERRITIN, TIBC, IRON, RETICCTPCT in  the last 72 hours. Urine analysis:    Component Value Date/Time   COLORURINE  STRAW (A) 03/05/2017 1120   APPEARANCEUR CLEAR 03/05/2017 1120   LABSPEC 1.005 03/05/2017 1120   PHURINE 6.0 03/05/2017 1120   GLUCOSEU NEGATIVE 03/05/2017 1120   HGBUR NEGATIVE 03/05/2017 1120   BILIRUBINUR NEGATIVE 03/05/2017 1120   KETONESUR NEGATIVE 03/05/2017 1120   PROTEINUR NEGATIVE 03/05/2017 1120   UROBILINOGEN 0.2 03/05/2015 1115   NITRITE NEGATIVE 03/05/2017 1120   LEUKOCYTESUR NEGATIVE 03/05/2017 1120   Sepsis Labs: @LABRCNTIP (procalcitonin:4,lacticidven:4) ) Recent Results (from the past 240 hour(s))  Resp Panel by RT-PCR (Flu A&B, Covid) Nasopharyngeal Swab     Status: None   Collection Time: 09/17/21 12:54 PM   Specimen: Nasopharyngeal Swab; Nasopharyngeal(NP) swabs in vial transport medium  Result Value Ref Range Status   SARS Coronavirus 2 by RT PCR NEGATIVE NEGATIVE Final    Comment: (NOTE) SARS-CoV-2 target nucleic acids are NOT DETECTED.  The SARS-CoV-2 RNA is generally detectable in upper respiratory specimens during the acute phase of infection. The lowest concentration of SARS-CoV-2 viral copies this assay can detect is 138 copies/mL. A negative result does not preclude SARS-Cov-2 infection and should not be used as the sole basis for treatment or other patient management decisions. A negative result may occur with  improper specimen collection/handling, submission of specimen other than nasopharyngeal swab, presence of viral mutation(s) within the areas targeted by this assay, and inadequate number of viral copies(<138 copies/mL). A negative result must be combined with clinical observations, patient history, and epidemiological information. The expected result is Negative.  Fact Sheet for Patients:  EntrepreneurPulse.com.au  Fact Sheet for Healthcare Providers:  IncredibleEmployment.be  This test is no t yet approved or cleared by the  [place] FDA and  has been authorized for detection and/or diagnosis of SARS-CoV-2 by FDA under an Emergency Use Authorization (EUA). This EUA will remain  in effect (meaning this test can be used) for the duration of the COVID-19 declaration under Section 564(b)(1) of the Act, 21 U.S.C.section 360bbb-3(b)(1), unless the authorization is terminated  or revoked sooner.       Influenza A by PCR NEGATIVE NEGATIVE Final   Influenza B by PCR NEGATIVE NEGATIVE Final    Comment: (NOTE) The Xpert Xpress SARS-CoV-2/FLU/RSV plus assay is intended as an aid in the diagnosis of influenza from Nasopharyngeal swab specimens and should not be used as a sole basis for treatment. Nasal washings and aspirates are unacceptable for Xpert Xpress SARS-CoV-2/FLU/RSV testing.  Fact Sheet for Patients: EntrepreneurPulse.com.au  Fact Sheet for Healthcare Providers: IncredibleEmployment.be  This test is not yet approved or cleared by the [place] FDA and has been authorized for detection and/or diagnosis of SARS-CoV-2 by FDA under an Emergency Use Authorization (EUA). This EUA will remain in effect (meaning this test can be used) for the duration of the COVID-19 declaration under Section 564(b)(1) of the Act, 21 U.S.C. section 360bbb-3(b)(1), unless the authorization is terminated or revoked.  Performed at [facility], [place] [address], [place], [place] [address]      Radiological Exams on Admission: DG Chest 2 View  Result Date: 09/17/2021 CLINICAL DATA:  Patient with chest pain. EXAM: CHEST - 2 VIEW COMPARISON:  Chest radiograph 02/21/2021. FINDINGS: Stable cardiomegaly. No large area pulmonary consolidation. Minimal basilar atelectasis on the lateral view. Suggestion of a possible small effusion. No pneumothorax. Thoracic spine degenerative changes. IMPRESSION: Cardiomegaly.  Possible small effusions. Electronically Signed   By: Lovey Newcomer M.D.    On: 09/17/2021 10:12    EKG: Independently reviewed.   Assessment/Plan Principal Problem:  Acute on chronic systolic  CHF Ischemic cardiomyopathy Alcoholic CM -Previously EF around 40-45%, will update echo -IV Lasix 40 Mg every 12 today -continue aldactone, Coreg -Monitor I/o, weight  Chest pain CAD -remote PCI/stent, 2004 -continue aspirin, statin, Coreg, imdur -Repeat 2D echocardiogram -trend troponins, INR subtherapeutic but d dimer negative  PAfib -Heart rate controlled, continue carvedilol, Coumadin, inr subtherapeutic  H/o CVA Chronic L side deficits -continue coumadin, pharmacy to dose  Chronic wounds -followed at wound center, [facility] consult  DM -hold metformin, SSI  DVT prophylaxis: coumadin Code Status: Full Code Family Communication: sister at bedside Disposition Plan: home in 2-3days Consults called: none Admission status: observation  Domenic Polite MD [facility]   09/17/2021, 3:26 PM

## 2021-09-17 NOTE — Progress Notes (Signed)
[place] for warfarin >> IV heparin Indication: atrial fibrillation / ACS   No Known Allergies  Patient Measurements: Height: 5\' 9"  (175.3 cm) Weight: 79.5 kg (175 lb 3.2 oz) IBW/kg (Calculated) : 70.7  Vital Signs: Temp: 97.9 F (36.6 C) (01/20 2052) Temp Source: Oral (01/20 2052) BP: 137/99 (01/20 2052) Pulse Rate: 98 (01/20 2052)  Labs: Recent Labs    09/17/21 0952 09/17/21 1206 09/17/21 1317 09/17/21 1723 09/17/21 2108  HGB 12.2*  --   --   --   --   HCT 39.0  --   --   --   --   PLT 193  --   --   --   --   LABPROT  --   --  18.1*  --   --   INR  --   --  1.5*  --   --   CREATININE 1.14  --   --   --   --   TROPONINIHS 14 36*  --  724* 1,360*     Estimated Creatinine Clearance: 49.1 mL/min (by C-G formula based on SCr of 1.14 mg/dL).   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 s/p remote stent to Cx 2004 with chronic stable angina in context of residual circumflex disease.   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 Pharmacy  initially consulted to give warfarin but troponins trending up, switching to IV heparin due to concern for ACS.   Goal of Therapy:  Heparin level 0.3-0.7 units/ml Monitor platelets by anticoagulation protocol: Yes   Plan:  -Warfarin d/c'ed -Start heparin at 1050 units/hr -F/u 8 hr HL -Monitor daily HL, CBC and s/s of bleeding   Albertina Parr, PharmD., BCPS, BCCCP Clinical Pharmacist Please refer to [facility] for unit-specific pharmacist

## 2021-09-17 NOTE — Progress Notes (Signed)
Critical lab: Troponin 1450. Pt denies chest pain or pressure. MD notified. No new orders at this time.

## 2021-09-17 NOTE — ED Notes (Signed)
Pt placed on condom cath 

## 2021-09-17 NOTE — Progress Notes (Signed)
ANTICOAGULATION CONSULT NOTE - Initial Consult  Pharmacy Consult for warfarin Indication: atrial fibrillation  No Known Allergies  Patient Measurements:    Vital Signs: Temp: 97.8 F (36.6 C) (01/20 0948) Temp Source: Oral (01/20 0948) BP: 120/75 (01/20 1503) Pulse Rate: 79 (01/20 1503)  Labs: Recent Labs    09/17/21 0952 09/17/21 1206 09/17/21 1317  HGB 12.2*  --   --   HCT 39.0  --   --   PLT 193  --   --   LABPROT  --   --  18.1*  INR  --   --  1.5*  CREATININE 1.14  --   --   TROPONINIHS 14 36*  --     CrCl cannot be calculated (Unknown ideal weight.).   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 s/p remote stent to Cx 2004 with chronic stable angina in context of residual circumflex disease.   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/25/2015   NSVT (nonsustained ventricular tachycardia)    Permanent atrial fibrillation (HCC)    Pulmonary eosinophilia (HCC)    Sinus bradycardia    Stroke [facility])    Syncope and collapse    Unspecified glaucoma(365.9)     Assessment: 55 YOM presenting with SOB, hx afib on warfarin PTA with last dose 1/19, INR on presentation subtherapeutic at 1.5  PTA dosing: 4mg  daily per anti-coag clinic 1/9, pt states taking 4mg  daily except 6mg  on Mondays  Goal of Therapy:  INR 2-3 Monitor platelets by anticoagulation protocol: Yes   Plan:  Give warfarin 6mg  PO x 1 today Daily INR, s/s  bleeding  Bertis Ruddy, PharmD Clinical Pharmacist ED Pharmacist Phone # [phone] 09/17/2021 3:53 PM

## 2021-09-18 ENCOUNTER — Observation Stay (HOSPITAL_COMMUNITY): Payer: Medicare Other

## 2021-09-18 DIAGNOSIS — R778 Other specified abnormalities of plasma proteins: Secondary | ICD-10-CM | POA: Insufficient documentation

## 2021-09-18 DIAGNOSIS — L97919 Non-pressure chronic ulcer of unspecified part of right lower leg with unspecified severity: Secondary | ICD-10-CM | POA: Diagnosis present

## 2021-09-18 DIAGNOSIS — E785 Hyperlipidemia, unspecified: Secondary | ICD-10-CM | POA: Diagnosis present

## 2021-09-18 DIAGNOSIS — I480 Paroxysmal atrial fibrillation: Secondary | ICD-10-CM | POA: Diagnosis present

## 2021-09-18 DIAGNOSIS — R0609 Other forms of dyspnea: Secondary | ICD-10-CM

## 2021-09-18 DIAGNOSIS — Z20822 Contact with and (suspected) exposure to covid-19: Secondary | ICD-10-CM | POA: Diagnosis present

## 2021-09-18 DIAGNOSIS — I69398 Other sequelae of cerebral infarction: Secondary | ICD-10-CM | POA: Diagnosis not present

## 2021-09-18 DIAGNOSIS — L97929 Non-pressure chronic ulcer of unspecified part of left lower leg with unspecified severity: Secondary | ICD-10-CM | POA: Diagnosis present

## 2021-09-18 DIAGNOSIS — F1020 Alcohol dependence, uncomplicated: Secondary | ICD-10-CM | POA: Diagnosis present

## 2021-09-18 DIAGNOSIS — I2510 Atherosclerotic heart disease of native coronary artery without angina pectoris: Secondary | ICD-10-CM

## 2021-09-18 DIAGNOSIS — E871 Hypo-osmolality and hyponatremia: Secondary | ICD-10-CM | POA: Diagnosis present

## 2021-09-18 DIAGNOSIS — I472 Ventricular tachycardia, unspecified: Secondary | ICD-10-CM | POA: Diagnosis not present

## 2021-09-18 DIAGNOSIS — I5021 Acute systolic (congestive) heart failure: Secondary | ICD-10-CM

## 2021-09-18 DIAGNOSIS — I214 Non-ST elevation (NSTEMI) myocardial infarction: Secondary | ICD-10-CM | POA: Diagnosis not present

## 2021-09-18 DIAGNOSIS — I252 Old myocardial infarction: Secondary | ICD-10-CM | POA: Diagnosis not present

## 2021-09-18 DIAGNOSIS — I081 Rheumatic disorders of both mitral and tricuspid valves: Secondary | ICD-10-CM | POA: Diagnosis present

## 2021-09-18 DIAGNOSIS — I255 Ischemic cardiomyopathy: Secondary | ICD-10-CM | POA: Diagnosis present

## 2021-09-18 DIAGNOSIS — I5023 Acute on chronic systolic (congestive) heart failure: Secondary | ICD-10-CM | POA: Diagnosis present

## 2021-09-18 DIAGNOSIS — I272 Pulmonary hypertension, unspecified: Secondary | ICD-10-CM | POA: Diagnosis present

## 2021-09-18 DIAGNOSIS — I426 Alcoholic cardiomyopathy: Secondary | ICD-10-CM | POA: Diagnosis not present

## 2021-09-18 DIAGNOSIS — K219 Gastro-esophageal reflux disease without esophagitis: Secondary | ICD-10-CM | POA: Diagnosis present

## 2021-09-18 DIAGNOSIS — I872 Venous insufficiency (chronic) (peripheral): Secondary | ICD-10-CM | POA: Diagnosis present

## 2021-09-18 DIAGNOSIS — I259 Chronic ischemic heart disease, unspecified: Secondary | ICD-10-CM | POA: Diagnosis not present

## 2021-09-18 DIAGNOSIS — I509 Heart failure, unspecified: Secondary | ICD-10-CM | POA: Diagnosis present

## 2021-09-18 DIAGNOSIS — J449 Chronic obstructive pulmonary disease, unspecified: Secondary | ICD-10-CM | POA: Diagnosis present

## 2021-09-18 DIAGNOSIS — I482 Chronic atrial fibrillation, unspecified: Secondary | ICD-10-CM

## 2021-09-18 DIAGNOSIS — M21372 Foot drop, left foot: Secondary | ICD-10-CM | POA: Diagnosis present

## 2021-09-18 DIAGNOSIS — I77810 Thoracic aortic ectasia: Secondary | ICD-10-CM | POA: Diagnosis present

## 2021-09-18 DIAGNOSIS — R791 Abnormal coagulation profile: Secondary | ICD-10-CM | POA: Diagnosis present

## 2021-09-18 DIAGNOSIS — R7989 Other specified abnormal findings of blood chemistry: Secondary | ICD-10-CM | POA: Insufficient documentation

## 2021-09-18 DIAGNOSIS — I110 Hypertensive heart disease with heart failure: Secondary | ICD-10-CM | POA: Diagnosis present

## 2021-09-18 DIAGNOSIS — Z7901 Long term (current) use of anticoagulants: Secondary | ICD-10-CM | POA: Diagnosis not present

## 2021-09-18 LAB — CBC
HCT: 39.4 % (ref 39.0–52.0)
Hemoglobin: 12.4 g/dL — ABNORMAL LOW (ref 13.0–17.0)
MCH: 26.2 pg (ref 26.0–34.0)
MCHC: 31.5 g/dL (ref 30.0–36.0)
MCV: 83.3 fL (ref 80.0–100.0)
Platelets: 183 10*3/uL (ref 150–400)
RBC: 4.73 MIL/uL (ref 4.22–5.81)
RDW: 16.9 % — ABNORMAL HIGH (ref 11.5–15.5)
WBC: 5.2 10*3/uL (ref 4.0–10.5)
nRBC: 0 % (ref 0.0–0.2)

## 2021-09-18 LAB — ECHOCARDIOGRAM COMPLETE
AR max vel: 1.97 cm2
AV Area VTI: 1.68 cm2
AV Area mean vel: 1.75 cm2
AV Mean grad: 3 mmHg
AV Peak grad: 4.8 mmHg
Ao pk vel: 1.09 m/s
Height: 69 in
MV M vel: 3.71 m/s
MV Peak grad: 55.1 mmHg
MV VTI: 1.06 cm2
Radius: 0.6 cm
S' Lateral: 6.1 cm
Weight: 2718.4 oz

## 2021-09-18 LAB — COMPREHENSIVE METABOLIC PANEL
ALT: 33 U/L (ref 0–44)
AST: 42 U/L — ABNORMAL HIGH (ref 15–41)
Albumin: 3.5 g/dL (ref 3.5–5.0)
Alkaline Phosphatase: 86 U/L (ref 38–126)
Anion gap: 8 (ref 5–15)
BUN: 15 mg/dL (ref 8–23)
CO2: 29 mmol/L (ref 22–32)
Calcium: 9.2 mg/dL (ref 8.9–10.3)
Chloride: 103 mmol/L (ref 98–111)
Creatinine, Ser: 1.05 mg/dL (ref 0.61–1.24)
GFR, Estimated: >60 mL/min (ref >60)
Glucose, Bld: 128 mg/dL — ABNORMAL HIGH (ref 70–99)
Potassium: 3.5 mmol/L (ref 3.5–5.1)
Sodium: 140 mmol/L (ref 135–145)
Total Bilirubin: 2.5 mg/dL — ABNORMAL HIGH (ref 0.3–1.2)
Total Protein: 6.6 g/dL (ref 6.5–8.1)

## 2021-09-18 LAB — PROTIME-INR
INR: 1.4 — ABNORMAL HIGH (ref 0.8–1.2)
Prothrombin Time: 17.5 seconds — ABNORMAL HIGH (ref 11.4–15.2)

## 2021-09-18 LAB — HEPARIN LEVEL (UNFRACTIONATED)
Heparin Unfractionated: 0.44 IU/mL (ref 0.30–0.70)
Heparin Unfractionated: 0.42 IU/mL (ref 0.30–0.70)

## 2021-09-18 LAB — GLUCOSE, CAPILLARY
Glucose-Capillary: 145 mg/dL — ABNORMAL HIGH (ref 70–99)
Glucose-Capillary: 160 mg/dL — ABNORMAL HIGH (ref 70–99)
Glucose-Capillary: 187 mg/dL — ABNORMAL HIGH (ref 70–99)
Glucose-Capillary: 119 mg/dL — ABNORMAL HIGH (ref 70–99)

## 2021-09-18 LAB — TROPONIN I (HIGH SENSITIVITY)
Troponin I (High Sensitivity): 1360 ng/L (ref <18)
Troponin I (High Sensitivity): 1624 ng/L (ref <18)
Troponin I (High Sensitivity): 1752 ng/L (ref <18)

## 2021-09-18 LAB — HEMOGLOBIN A1C
Hgb A1c MFr Bld: 7 % — ABNORMAL HIGH (ref 4.8–5.6)
Mean Plasma Glucose: 154 mg/dL

## 2021-09-18 MED ORDER — ISOSORBIDE MONONITRATE ER 30 MG PO TB24
15.0000 mg | ORAL_TABLET | Freq: Every day | ORAL | Status: DC
  Administered 2021-09-19 – 2021-09-20 (×2): 15 mg via ORAL
  Filled 2021-09-18 (×2): qty 1

## 2021-09-18 MED ORDER — ALUM & MAG HYDROXIDE-SIMETH 200-200-20 MG/5ML PO SUSP
15.0000 mL | Freq: Four times a day (QID) | ORAL | Status: DC | PRN
  Administered 2021-09-18: 15 mL via ORAL
  Filled 2021-09-18: qty 30

## 2021-09-18 MED ORDER — SODIUM CHLORIDE 0.9% FLUSH
3.0000 mL | Freq: Two times a day (BID) | INTRAVENOUS | Status: DC
  Administered 2021-09-18 – 2021-09-21 (×6): 3 mL via INTRAVENOUS

## 2021-09-18 NOTE — Progress Notes (Signed)
Received a call from telemetry stating pt's O2 sat was low and his heart rate had dropped to 39. While in the room pt's hr running in the low 40s. Pt asymptomatic sitting in the chair. MD made aware.

## 2021-09-18 NOTE — Progress Notes (Addendum)
PROGRESS NOTE    Vincent [name]  YSA:[id number] DOB: 1936-05-06 DOA: 09/17/2021 PCP: [name], MD  Brief Narrative: 85/M with history of CAD, remote PCI and stent in 3235, chronic systolic CHF EF of 57%, chronic stable angina, alcoholic cardiomyopathy, paroxysmal A. fib on Coumadin, type 2 diabetes mellitus, history of CVA, chronic left foot drop and chronic bilateral leg wounds presented to the ED 1/20 with chest pain and shortness of breath. -EKG noted LVH with repolarization abnormalities, troponin was normal initially, trended up to 1700 today, chest x-ray noted cardiomegaly and small pleural effusions   Subjective: -Feels okay today, denies any chest pain, breathing a little better  Assessment & Plan:  Acute on chronic systolic  CHF Ischemic cardiomyopathy Alcoholic CM -Previously EF around 40-45%, follow-up repeat echo -Diuresing well on IV Lasix he is 3.8 L negative -Continue IV Lasix, Aldactone and Coreg -Transition to oral diuretics soon  NSTEMI CAD -remote PCI/stent, 2004 -Cardiac cath 12/16 noted patent RCA stent, distal circumflex with 75% stenosis, medical management recommended then, also has 60% mid RCA and proximal RCA lesions -continue aspirin, statin, Coreg, imdur -Coumadin held -Started on IV heparin overnight -Follow-up repeat 2D echocardiogram -Cardiology consult   PAfib -Heart rate controlled, continue carvedilol, Coumadin, inr subtherapeutic on admission, now on IV heparin   H/o CVA Chronic L side deficits -Hold Coumadin for cath   Chronic wounds -followed at wound center, [facility] consult   DM -hold metformin, SSI   DVT prophylaxis: coumadin/heparin Code Status: Full Code Family Communication: sister at bedside yesterday Disposition Plan: home in 2-3days Observation, needs to be inpatient given severity of illness  Consultants:  Cardiology  Procedures:   Antimicrobials:    Objective: Vitals:   09/18/21 0039 09/18/21 0040 09/18/21  0041 09/18/21 0603  BP:    116/77  Pulse: (!) 105 (!) 103 68 92  Resp:    18  Temp:    98.2 F (36.8 C)  TempSrc:    Oral  SpO2: 96% 100% 96% 100%  Weight:    77.1 kg  Height:        Intake/Output Summary (Last 24 hours) at 09/18/2021 1106 Last data filed at 09/18/2021 1051 Gross per 24 hour  Intake 480 ml  Output 4150 ml  Net -3670 ml   Filed Weights   09/17/21 2055 09/18/21 0603  Weight: 79.5 kg 77.1 kg    Examination:  General: AAOx3, no distress HEENT: Positive JVD CVS: S1-S2, irregularly irregular rhythm Lungs: Poor air movement bilaterally, few basilar rales Abdomen: Soft, nontender, bowel sounds present  Extremities: Both lower legs wrapped with Unna boots, , 1+ edema RLE Skin: As above neuro" Mild left deficits-chronic  Data Reviewed:   CBC: Recent Labs  Lab 09/17/21 0952 09/18/21 0147  WBC 4.2 5.2  HGB 12.2* 12.4*  HCT 39.0 39.4  MCV 85.7 83.3  PLT 193 322   Basic Metabolic Panel: Recent Labs  Lab 09/17/21 0952 09/18/21 0147  NA 138 140  K 3.5 3.5  CL 104 103  CO2 27 29  GLUCOSE 193* 128*  BUN 16 15  CREATININE 1.14 1.05  CALCIUM 8.9 9.2   GFR: Estimated Creatinine Clearance: 53.3 mL/min (by C-G formula based on SCr of 1.05 mg/dL). Liver Function Tests: Recent Labs  Lab 09/18/21 0147  AST 42*  ALT 33  ALKPHOS 86  BILITOT 2.5*  PROT 6.6  ALBUMIN 3.5   No results for input(s): LIPASE, AMYLASE in the last 168 hours. No results for input(s): AMMONIA in  the last 168 hours. Coagulation Profile: Recent Labs  Lab 09/17/21 1317 09/18/21 0147  INR 1.5* 1.4*   Cardiac Enzymes: No results for input(s): CKTOTAL, CKMB, CKMBINDEX, TROPONINI in the last 168 hours. BNP (last 3 results) No results for input(s): PROBNP in the last 8760 hours. HbA1C: Recent Labs    09/17/21 1723  HGBA1C 7.0*   CBG: Recent Labs  Lab 09/17/21 1702 09/17/21 2256 09/18/21 0624  GLUCAP 109* 111* 145*   Lipid Profile: No results for input(s): CHOL,  HDL, LDLCALC, TRIG, CHOLHDL, LDLDIRECT in the last 72 hours. Thyroid Function Tests: No results for input(s): TSH, T4TOTAL, FREET4, T3FREE, THYROIDAB in the last 72 hours. Anemia Panel: No results for input(s): VITAMINB12, FOLATE, FERRITIN, TIBC, IRON, RETICCTPCT in the last 72 hours. Urine analysis:    Component Value Date/Time   COLORURINE STRAW (A) 03/05/2017 1120   APPEARANCEUR CLEAR 03/05/2017 1120   LABSPEC 1.005 03/05/2017 1120   PHURINE 6.0 03/05/2017 1120   GLUCOSEU NEGATIVE 03/05/2017 1120   HGBUR NEGATIVE 03/05/2017 1120   BILIRUBINUR NEGATIVE 03/05/2017 1120   KETONESUR NEGATIVE 03/05/2017 1120   PROTEINUR NEGATIVE 03/05/2017 1120   UROBILINOGEN 0.2 03/05/2015 1115   NITRITE NEGATIVE 03/05/2017 1120   LEUKOCYTESUR NEGATIVE 03/05/2017 1120   Sepsis Labs: @LABRCNTIP (procalcitonin:4,lacticidven:4)  ) Recent Results (from the past 240 hour(s))  Resp Panel by RT-PCR (Flu A&B, Covid) Nasopharyngeal Swab     Status: None   Collection Time: 09/17/21 12:54 PM   Specimen: Nasopharyngeal Swab; Nasopharyngeal(NP) swabs in vial transport medium  Result Value Ref Range Status   SARS Coronavirus 2 by RT PCR NEGATIVE NEGATIVE Final    Comment: (NOTE) SARS-CoV-2 target nucleic acids are NOT DETECTED.  The SARS-CoV-2 RNA is generally detectable in upper respiratory specimens during the acute phase of infection. The lowest concentration of SARS-CoV-2 viral copies this assay can detect is 138 copies/mL. A negative result does not preclude SARS-Cov-2 infection and should not be used as the sole basis for treatment or other patient management decisions. A negative result may occur with  improper specimen collection/handling, submission of specimen other than nasopharyngeal swab, presence of viral mutation(s) within the areas targeted by this assay, and inadequate number of viral copies(<138 copies/mL). A negative result must be combined with clinical observations, patient history,  and epidemiological information. The expected result is Negative.  Fact Sheet for Patients:  EntrepreneurPulse.com.au  Fact Sheet for Healthcare Providers:  IncredibleEmployment.be  This test is no t yet approved or cleared by the [place] FDA and  has been authorized for detection and/or diagnosis of SARS-CoV-2 by FDA under an Emergency Use Authorization (EUA). This EUA will remain  in effect (meaning this test can be used) for the duration of the COVID-19 declaration under Section 564(b)(1) of the Act, 21 U.S.C.section 360bbb-3(b)(1), unless the authorization is terminated  or revoked sooner.       Influenza A by PCR NEGATIVE NEGATIVE Final   Influenza B by PCR NEGATIVE NEGATIVE Final    Comment: (NOTE) The Xpert Xpress SARS-CoV-2/FLU/RSV plus assay is intended as an aid in the diagnosis of influenza from Nasopharyngeal swab specimens and should not be used as a sole basis for treatment. Nasal washings and aspirates are unacceptable for Xpert Xpress SARS-CoV-2/FLU/RSV testing.  Fact Sheet for Patients: EntrepreneurPulse.com.au  Fact Sheet for Healthcare Providers: IncredibleEmployment.be  This test is not yet approved or cleared by the [place] FDA and has been authorized for detection and/or diagnosis of SARS-CoV-2 by FDA under an Emergency Use Authorization (  EUA). This EUA will remain in effect (meaning this test can be used) for the duration of the COVID-19 declaration under Section 564(b)(1) of the Act, 21 U.S.C. section 360bbb-3(b)(1), unless the authorization is terminated or revoked.  Performed at [facility], [place] [address], [place], [place] [address]      Radiology Studies: DG Chest 2 View  Result Date: 09/17/2021 CLINICAL DATA:  Patient with chest pain. EXAM: CHEST - 2 VIEW COMPARISON:  Chest radiograph 02/21/2021. FINDINGS: Stable cardiomegaly. No large area pulmonary  consolidation. Minimal basilar atelectasis on the lateral view. Suggestion of a possible small effusion. No pneumothorax. Thoracic spine degenerative changes. IMPRESSION: Cardiomegaly.  Possible small effusions. Electronically Signed   By: Lovey Newcomer M.D.   On: 09/17/2021 10:12     Scheduled Meds:  aspirin  325 mg Oral Daily   atorvastatin  40 mg Oral Daily   brimonidine  1 drop Both Eyes BID   carvedilol  3.125 mg Oral BID WC   empagliflozin  25 mg Oral Daily   furosemide  40 mg Intravenous Q12H   insulin aspart  0-9 Units Subcutaneous TID WC   isosorbide mononitrate  30 mg Oral Daily   spironolactone  25 mg Oral Daily   tamsulosin  0.4 mg Oral Daily   Continuous Infusions:  heparin 1,050 Units/hr (09/18/21 0009)     LOS: 0 days    Time spent: 36min    Domenic Polite, MD [facility]   09/18/2021, 11:06 AM

## 2021-09-18 NOTE — Progress Notes (Signed)
[place] for warfarin >> IV heparin Indication: atrial fibrillation / ACS   No Known Allergies  Patient Measurements: Height: 5\' 9"  (175.3 cm) Weight: 77.1 kg (169 lb 14.4 oz) IBW/kg (Calculated) : 70.7  Vital Signs: Temp: 97.5 F (36.4 C) (01/21 1624) Temp Source: Oral (01/21 1624) BP: 110/62 (01/21 1624) Pulse Rate: 52 (01/21 1624)  Labs: Recent Labs    09/17/21 1740 09/17/21 1206 09/17/21 1317 09/17/21 1723 09/17/21 2108 09/18/21 0147 09/18/21 0502 09/18/21 0754 09/18/21 1538  HGB 12.2*  --   --   --   --  12.4*  --   --   --   HCT 39.0  --   --   --   --  39.4  --   --   --   PLT 193  --   --   --   --  183  --   --   --   LABPROT  --   --  18.1*  --   --  17.5*  --   --   --   INR  --   --  1.5*  --   --  1.4*  --   --   --   HEPARINUNFRC  --   --   --   --   --   --   --  0.44 0.42  CREATININE 1.14  --   --   --   --  1.05  --   --   --   TROPONINIHS 14   < >  --    < > 1,360* 1,624* 1,752*  --   --    < > = values in this interval not displayed.     Estimated Creatinine Clearance: 53.3 mL/min (by C-G formula based on SCr of 1.05 mg/dL).   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 s/p remote stent to Cx 2004 with chronic stable angina in context of residual circumflex disease.   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 Pharmacy initially consulted to give warfarin but troponins trending up, switched to IV heparin due to concern for ACS. Warfarin d/c'ed.  -Per RN, there was bleeding from the IV this morning and a new IV had to be started. No issues from new line -heparin level remains at goal on 1050 units/hr   Goal of Therapy:  Heparin level 0.3-0.7 units/ml Monitor platelets by anticoagulation protocol: Yes   Plan:  -Continue heparin at 1050  units/hr -Monitor daily HL, CBC    Hildred Laser, PharmD Clinical Pharmacist **Pharmacist phone directory can now be found on [email or web address] ([email or web address]).  Listed under [place].

## 2021-09-18 NOTE — Progress Notes (Signed)
Received a call from tele that pt had a 5 beat run of vtach. MD on the floor and made aware. Pt asymptomatic at the present time. Beta blocker given to patient.

## 2021-09-18 NOTE — Consult Note (Signed)
In Error.   No charge.   Rex Kras, [place], [facility]  Pager: [phone] Office: [phone]

## 2021-09-18 NOTE — Consult Note (Signed)
CARDIOLOGY CONSULT NOTE       Patient ID: Vincent [name] MRN: [id number] DOB/AGE: 1935/11/13 86 y.o.  Admit date: 09/17/2021 Referring Physician: [name] Primary Physician: [name], MD Primary Cardiologist: [name] Reason for Consultation: MI/CHF  Principal Problem:   CHF (congestive heart failure) [employer]) Active Problems:   Essential hypertension   Coronary atherosclerosis   Alcoholic cardiomyopathy ([place])   PAROXYSMAL ATRIAL FIBRILLATION   Cerebral artery occlusion with cerebral infarction [facility])   Chest pain   HPI:  86 y.o. remarkable black male. 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 Admitted with SEMI and CHF. Remote stent to the RCA Last cath by Dr [name] 07/30/15 with with patent mid RCA stent and 60% proximal/distal stenosis Also had mid to distal LCX 75% stenosis Rx medically At that time EF disproportionately low 35% History of alcohol abuse TTE 2015 EF 40-45% he has had SSCP for a few weeks and admitted with worsening pain/dyspnea. Now pain free Troponin peak 1752 ECG with anterolateral T wave inversions. 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 s/p remote stent to Cx 2004 with chronic stable angina in context of residual circumflex disease.   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/25/2015   NSVT (nonsustained ventricular tachycardia)    Permanent atrial fibrillation (HCC)    Pulmonary eosinophilia (HCC)    Sinus bradycardia    Stroke (HCC)    Syncope and collapse    Unspecified glaucoma(365.9)     Family History  Problem Relation Age of Onset   Colon cancer Mother    Hypertension Mother    Cancer Mother    Prostate cancer Brother    Cancer Brother    Heart disease Father    Heart attack Father    Hypertension Father    Heart attack Sister    Cancer Sister    Stroke Neg Hx    Colon polyps Neg Hx    Esophageal cancer Neg Hx    Stomach cancer Neg Hx    Rectal cancer Neg Hx    Pancreatic cancer Neg Hx     Social History   Socioeconomic History   Marital status: Divorced    Spouse name: Not on file   Number of children: 0   Years of education: master's   Highest education level: Not on file  Occupational History   Occupation: semi-retired  Tobacco Use   Smoking status: Former    Types: Cigarettes    Quit date: 08/22/1983    Years since quitting: 38.1   Smokeless tobacco: Never  Vaping Use   Vaping Use: Never used  Substance  and Sexual Activity   Alcohol use: No   Drug use: No   Sexual activity: Not on file  Other Topics Concern   Not on file  Social History Narrative   Patient drinks 1-2 cups of caffeine daily.   Patient is right handed.   Social Determinants of Health   Financial Resource Strain: Not on file  Food Insecurity: Not on file  Transportation Needs: Not on file  Physical Activity: Not on file  Stress: Not on file  Social Connections: Not on file  Intimate Partner Violence: Not on file    Past Surgical History:  Procedure Laterality Date   CARDIAC CATHETERIZATION N/A 07/30/2015   Procedure: Left Heart Cath and Coronary Angiography;  Surgeon: Belva Crome, MD;  Location: [facility];  Service: Cardiovascular;  Laterality: N/A;   COLONOSCOPY     KNEE SURGERY     loop recorder  09/2007      Current Facility-Administered Medications:    acetaminophen (TYLENOL) tablet 500 mg, 500 mg, Oral, Q8H PRN, Domenic Polite, MD   aspirin tablet 325 mg, 325 mg, Oral, Daily, Domenic Polite, MD, 325 mg at 09/18/21 0837   atorvastatin (LIPITOR) tablet 40 mg, 40 mg, Oral, Daily, Domenic Polite, MD, 40 mg at 09/18/21 0838   brimonidine (ALPHAGAN) 0.2 % ophthalmic solution 1 drop, 1 drop, Both Eyes, BID, Domenic Polite, MD, 1 drop at 09/18/21 0837   carvedilol (COREG) tablet 3.125 mg, 3.125 mg, Oral, BID WC, Domenic Polite, MD, 3.125 mg at 09/18/21 6010   empagliflozin (JARDIANCE) tablet 25 mg, 25 mg, Oral, Daily, Domenic Polite, MD, 25 mg at 09/18/21 9323   furosemide (LASIX) injection 40 mg, 40 mg, Intravenous, Q12H, Domenic Polite, MD, 40 mg at 09/18/21 0648   heparin ADULT infusion 100 units/mL (25000 units/273mL), 1,050 Units/hr, Intravenous, Continuous, [place], Darnell Level, RPH, Last Rate: 10.5 mL/hr at 09/18/21 0009, 1,050 Units/hr at 09/18/21 0009   insulin aspart (novoLOG) injection 0-9 Units, 0-9 Units, Subcutaneous, TID WC, Domenic Polite, MD   Derrill Memo ON 09/19/2021] isosorbide mononitrate (IMDUR) 24 hr tablet 15 mg, 15 mg, Oral, Daily, Domenic Polite, MD   nitroGLYCERIN (NITROLINGUAL) 0.4 MG/SPRAY spray 1 spray, 1 spray, Sublingual, Q5 Min x 3 PRN, Domenic Polite, MD   ondansetron [facility]) tablet 4 mg, 4 mg, Oral, Q6H PRN **OR** ondansetron (ZOFRAN) injection 4 mg, 4 mg, Intravenous, Q6H PRN, Domenic Polite, MD   spironolactone (ALDACTONE) tablet 25 mg, 25 mg, Oral, Daily, Domenic Polite, MD, 25 mg at 09/18/21 5573   tamsulosin (FLOMAX) capsule 0.4 mg, 0.4 mg, Oral, Daily, Domenic Polite, MD, 0.4 mg at 09/18/21 2202   traMADol (ULTRAM) tablet 50 mg, 50 mg, Oral, TID PRN, Domenic Polite, MD  aspirin  325 mg Oral Daily   atorvastatin   40 mg Oral Daily   brimonidine  1 drop Both Eyes BID   carvedilol  3.125 mg Oral BID WC   empagliflozin  25 mg Oral Daily   furosemide  40 mg Intravenous Q12H   insulin aspart  0-9 Units Subcutaneous TID WC   [START ON 09/19/2021] isosorbide mononitrate  15 mg Oral Daily   spironolactone  25 mg Oral Daily   tamsulosin  0.4 mg Oral Daily    heparin 1,050 Units/hr (09/18/21 0009)    Physical Exam: Blood pressure 116/77, pulse 92, temperature 98.2 F (36.8 C), temperature source Oral, resp. rate 18, height 5\' 9"  (1.753 m), weight 77.1 kg, SpO2 100 %.    Elderly black male Mild  basilar rales SEM JVP elevated PMI prominent  Abdomen benign Chronic eczema legs wrapped  Good right radial pulse  Left foot drop   Labs:   Lab Results  Component Value Date   WBC 5.2 09/18/2021   HGB 12.4 (L) 09/18/2021   HCT 39.4 09/18/2021   MCV 83.3 09/18/2021   PLT 183 09/18/2021    Recent Labs  Lab 09/18/21 0147  NA 140  K 3.5  CL 103  CO2 29  BUN 15  CREATININE 1.05  CALCIUM 9.2  PROT 6.6  BILITOT 2.5*  ALKPHOS 86  ALT 33  AST 42*  GLUCOSE 128*   Lab Results  Component Value Date   CKTOTAL 311 HEMOLYZED SPECIMEN, RESULTS MAY BE AFFECTED (H) 10/18/2007   CKMB 2.0 HEMOLYZED SPECIMEN, RESULTS MAY BE AFFECTED 10/18/2007   TROPONINI <0.30 03/17/2014    Lab Results  Component Value Date   CHOL 156 11/28/2017   CHOL 164 02/20/2017   CHOL 146 04/27/2016   Lab Results  Component Value Date   HDL 52 11/28/2017   HDL 54 02/20/2017   HDL 57 04/27/2016   Lab Results  Component Value Date   LDLCALC 72 11/28/2017   LDLCALC 92 02/20/2017   LDLCALC 64 04/27/2016   Lab Results  Component Value Date   TRIG 160 (H) 11/28/2017   TRIG 92 02/20/2017   TRIG 126 04/27/2016   Lab Results  Component Value Date   CHOLHDL 3.0 11/28/2017   CHOLHDL 3.0 02/20/2017   CHOLHDL 2.6 04/27/2016   No results found for: LDLDIRECT    Radiology: DG Chest 2 View  Result Date:  09/17/2021 CLINICAL DATA:  Patient with chest pain. EXAM: CHEST - 2 VIEW COMPARISON:  Chest radiograph 02/21/2021. FINDINGS: Stable cardiomegaly. No large area pulmonary consolidation. Minimal basilar atelectasis on the lateral view. Suggestion of a possible small effusion. No pneumothorax. Thoracic spine degenerative changes. IMPRESSION: Cardiomegaly.  Possible small effusions. Electronically Signed   By: Lovey Newcomer M.D.   On: 09/17/2021 10:12   ECHOCARDIOGRAM COMPLETE  Result Date: 09/18/2021    ECHOCARDIOGRAM REPORT   Patient Name:   Vincent [name] Date of Exam: 09/18/2021 Medical Rec #:  [id number]      Height:       69.0 in Accession #:    [id number]     Weight:       169.9 lb Date of Birth:  Jul 17, 1936       BSA:          1.928 m Patient Age:    12 years       BP:           116/77 mmHg Patient Gender: M              HR:           54 bpm. Exam Location:  Inpatient Procedure: 2D Echo, Cardiac Doppler and Color Doppler Indications:    Dyspnea  History:        Patient has prior history of Echocardiogram examinations, most                 recent 03/18/2014. CAD, Arrythmias:Atrial Fibrillation; Risk                 Factors:Diabetes and Former Smoker. S/P PCI. ETOH abuse. History                 of CVA.  Sonographer:    Clayton Lefort RDCS (AE) Referring Phys: [place]  IMPRESSIONS  1. Left ventricular ejection fraction, by estimation, is 25%. The left ventricle has normal function. The left ventricle demonstrates global hypokinesis. Left ventricular diastolic parameters are indeterminate.  2. Right ventricular systolic function is normal. The right ventricular size is normal. There is normal pulmonary artery systolic pressure.  3. Left atrial size was mildly dilated.  4. The mitral valve is abnormal. Mild to moderate mitral valve regurgitation. No evidence of mitral stenosis.  5. Tricuspid valve regurgitation is moderate.  6. The aortic valve is tricuspid. There is mild calcification of the aortic valve.  Aortic valve regurgitation is not visualized. Aortic valve sclerosis is present, with no evidence of aortic valve stenosis.  7. Aortic dilatation noted. There is mild dilatation of the ascending aorta, measuring 38 mm.  8. The inferior vena cava is dilated in size with >50% respiratory variability, suggesting right atrial pressure of 8 mmHg. FINDINGS  Left Ventricle: Left ventricular ejection fraction, by estimation, is 25%. The left ventricle has normal function. The left ventricle demonstrates global hypokinesis. The left ventricular internal cavity size was normal in size. There is no left ventricular hypertrophy. Left ventricular diastolic parameters are indeterminate. Right Ventricle: The right ventricular size is normal. No increase in right ventricular wall thickness. Right ventricular systolic function is normal. There is normal pulmonary artery systolic pressure. The tricuspid regurgitant velocity is 2.36 m/s, and  with an assumed right atrial pressure of 8 mmHg, the estimated right ventricular systolic pressure is 29.5 mmHg. Left Atrium: Left atrial size was mildly dilated. Right Atrium: Right atrial size was normal in size. Pericardium: There is no evidence of pericardial effusion. Mitral Valve: The mitral valve is abnormal. There is mild thickening of the mitral valve leaflet(s). Mild to moderate mitral valve regurgitation. No evidence of mitral valve stenosis. MV peak gradient, 5.8 mmHg. The mean mitral valve gradient is 1.0 mmHg. Tricuspid Valve: The tricuspid valve is normal in structure. Tricuspid valve regurgitation is moderate . No evidence of tricuspid stenosis. Aortic Valve: The aortic valve is tricuspid. There is mild calcification of the aortic valve. Aortic valve regurgitation is not visualized. Aortic valve sclerosis is present, with no evidence of aortic valve stenosis. Aortic valve mean gradient measures 3.0 mmHg. Aortic valve peak gradient measures 4.8 mmHg. Aortic valve area, by VTI  measures 1.68 cm. Pulmonic Valve: The pulmonic valve was normal in structure. Pulmonic valve regurgitation is not visualized. No evidence of pulmonic stenosis. Aorta: Aortic dilatation noted. There is mild dilatation of the ascending aorta, measuring 38 mm. Venous: The inferior vena cava is dilated in size with greater than 50% respiratory variability, suggesting right atrial pressure of 8 mmHg. IAS/Shunts: No atrial level shunt detected by color flow Doppler.  LEFT VENTRICLE PLAX 2D LVIDd:         6.80 cm LVIDs:         6.10 cm LV PW:         1.30 cm LV IVS:        0.90 cm LVOT diam:     2.10 cm LV SV:         36 LV SV Index:   19 LVOT Area:     3.46 cm  RIGHT VENTRICLE            IVC RV S prime:     6.68 cm/s  IVC diam: 2.10 cm TAPSE (M-mode): 1.2 cm LEFT ATRIUM              Index  RIGHT ATRIUM           Index LA diam:        4.00 cm  2.08 cm/m   RA Area:     27.80 cm LA Vol (A2C):   100.0 ml 51.88 ml/m  RA Volume:   110.00 ml 57.06 ml/m LA Vol (A4C):   110.0 ml 57.06 ml/m LA Biplane Vol: 106.0 ml 54.99 ml/m  AORTIC VALVE AV Area (Vmax):    1.97 cm AV Area (Vmean):   1.75 cm AV Area (VTI):     1.68 cm AV Vmax:           109.00 cm/s AV Vmean:          73.000 cm/s AV VTI:            0.212 m AV Peak Grad:      4.8 mmHg AV Mean Grad:      3.0 mmHg LVOT Vmax:         62.00 cm/s LVOT Vmean:        36.900 cm/s LVOT VTI:          0.103 m LVOT/AV VTI ratio: 0.49  AORTA Ao Root diam: 3.40 cm Ao Asc diam:  3.80 cm MITRAL VALVE                  TRICUSPID VALVE MV Area VTI:  1.06 cm        TR Peak grad:   22.3 mmHg MV Peak grad: 5.8 mmHg        TR Vmax:        236.00 cm/s MV Mean grad: 1.0 mmHg MV Vmax:      1.20 m/s        SHUNTS MV Vmean:     51.3 cm/s       Systemic VTI:  0.10 m MR Peak grad:    55.1 mmHg    Systemic Diam: 2.10 cm MR Mean grad:    33.0 mmHg MR Vmax:         371.00 cm/s MR Vmean:        267.0 cm/s MR PISA:         2.26 cm MR PISA Eff ROA: 21 mm MR PISA Radius:  0.60 cm Jenkins Rouge MD  Electronically signed by Jenkins Rouge MD Signature Date/Time: 09/18/2021/1:21:13 PM    Final     EKG: See HPI   ASSESSMENT AND PLAN:   SEMI:  known 2 vessel CAD with distant stent to mid RCA. Pain free with new ECG changes anterolateral T inversions. Continue ASA/heparin will need right and left cath Monday Risks including stroke, surgery, bleeding contrast reaction and intubation discussed with patient and sister willing to proceed  CHF:  He appears euvolemic except for legs which are chronically abnormal Sees wound center for eczema lesions. K 3.5 CR 1.05 would add aldactone 25 mm daily Will need to add ARB/ARNI post cath after assessing filling pressures and contrast given Afib continue coreg for rate control transition to heparin for cath would see if he is willing to use DOAC post cath He may require triple Rx if new stent put in and this would be more difficult with coumadin  Skin:  chronic LE ulcers from eczema needs some diuretic has routine f/u with wound center   Signed: Jenkins Rouge 09/18/2021, 2:26 PM

## 2021-09-18 NOTE — Progress Notes (Signed)
[place] for warfarin >> IV heparin Indication: atrial fibrillation / ACS   No Known Allergies  Patient Measurements: Height: 5\' 9"  (175.3 cm) Weight: 77.1 kg (169 lb 14.4 oz) IBW/kg (Calculated) : 70.7  Vital Signs: Temp: 98.2 F (36.8 C) (01/21 0603) Temp Source: Oral (01/21 0603) BP: 116/77 (01/21 0603) Pulse Rate: 92 (01/21 0603)  Labs: Recent Labs    09/17/21 6378 09/17/21 1206 09/17/21 1317 09/17/21 1723 09/17/21 2108 09/18/21 0147 09/18/21 0502 09/18/21 0754  HGB 12.2*  --   --   --   --  12.4*  --   --   HCT 39.0  --   --   --   --  39.4  --   --   PLT 193  --   --   --   --  183  --   --   LABPROT  --   --  18.1*  --   --  17.5*  --   --   INR  --   --  1.5*  --   --  1.4*  --   --   HEPARINUNFRC  --   --   --   --   --   --   --  0.44  CREATININE 1.14  --   --   --   --  1.05  --   --   TROPONINIHS 14   < >  --    < > 1,360* 1,624* 1,752*  --    < > = values in this interval not displayed.     Estimated Creatinine Clearance: 53.3 mL/min (by C-G formula based on SCr of 1.05 mg/dL).   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 s/p remote stent to Cx 2004 with chronic stable angina in context of residual circumflex disease.   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 Pharmacy initially consulted to give warfarin but troponins trending up, switched to IV heparin due to concern for ACS. Warfarin d/c'ed. Heparin started at 1050 units/hr/ Level therapeutic at 0.44 this morning. CBC stable. Per RN, there was bleeding from the IV this morning and a new IV had to be started. No issues from new line, RN to notify if status changes.   Goal of Therapy:  Heparin level 0.3-0.7 units/ml Monitor platelets by anticoagulation protocol: Yes   Plan:  -Continue heparin at 1050 units/hr -F/u 8 hr HL -Monitor daily HL, CBC and s/s of bleeding  Marzella Schlein [place], Stage manager

## 2021-09-18 NOTE — Plan of Care (Signed)
°  Problem: Education: Goal: Ability to demonstrate management of disease process will improve Outcome: Progressing   Problem: Clinical Measurements: Goal: Respiratory complications will improve Outcome: Progressing   Problem: Coping: Goal: Level of anxiety will decrease Outcome: Progressing

## 2021-09-18 NOTE — Progress Notes (Signed)
°  Echocardiogram 2D Echocardiogram has been performed.  Vincent [name] 09/18/2021, 12:30 PM

## 2021-09-18 NOTE — Social Work (Signed)
CSW acknowledges consult for SNF/HH. The patient will require PT/OT evaluations. TOC will assist with disposition planning once the evaluations have been completed.  °  °TOC will continue to follow.    °

## 2021-09-19 DIAGNOSIS — I5021 Acute systolic (congestive) heart failure: Secondary | ICD-10-CM | POA: Diagnosis not present

## 2021-09-19 DIAGNOSIS — I259 Chronic ischemic heart disease, unspecified: Secondary | ICD-10-CM

## 2021-09-19 DIAGNOSIS — L089 Local infection of the skin and subcutaneous tissue, unspecified: Secondary | ICD-10-CM | POA: Diagnosis present

## 2021-09-19 DIAGNOSIS — I5022 Chronic systolic (congestive) heart failure: Secondary | ICD-10-CM | POA: Diagnosis present

## 2021-09-19 DIAGNOSIS — I214 Non-ST elevation (NSTEMI) myocardial infarction: Secondary | ICD-10-CM | POA: Diagnosis present

## 2021-09-19 DIAGNOSIS — L97509 Non-pressure chronic ulcer of other part of unspecified foot with unspecified severity: Secondary | ICD-10-CM | POA: Diagnosis present

## 2021-09-19 DIAGNOSIS — E11621 Type 2 diabetes mellitus with foot ulcer: Secondary | ICD-10-CM | POA: Diagnosis present

## 2021-09-19 DIAGNOSIS — I5023 Acute on chronic systolic (congestive) heart failure: Secondary | ICD-10-CM | POA: Diagnosis not present

## 2021-09-19 DIAGNOSIS — I482 Chronic atrial fibrillation, unspecified: Secondary | ICD-10-CM | POA: Diagnosis not present

## 2021-09-19 DIAGNOSIS — R778 Other specified abnormalities of plasma proteins: Secondary | ICD-10-CM | POA: Diagnosis not present

## 2021-09-19 LAB — CBC
HCT: 40.7 % (ref 39.0–52.0)
Hemoglobin: 12.9 g/dL — ABNORMAL LOW (ref 13.0–17.0)
MCH: 26.4 pg (ref 26.0–34.0)
MCHC: 31.7 g/dL (ref 30.0–36.0)
MCV: 83.2 fL (ref 80.0–100.0)
Platelets: 181 10*3/uL (ref 150–400)
RBC: 4.89 MIL/uL (ref 4.22–5.81)
RDW: 16.8 % — ABNORMAL HIGH (ref 11.5–15.5)
WBC: 5.1 10*3/uL (ref 4.0–10.5)
nRBC: 0 % (ref 0.0–0.2)

## 2021-09-19 LAB — GLUCOSE, CAPILLARY
Glucose-Capillary: 116 mg/dL — ABNORMAL HIGH (ref 70–99)
Glucose-Capillary: 116 mg/dL — ABNORMAL HIGH (ref 70–99)
Glucose-Capillary: 148 mg/dL — ABNORMAL HIGH (ref 70–99)
Glucose-Capillary: 157 mg/dL — ABNORMAL HIGH (ref 70–99)

## 2021-09-19 LAB — BASIC METABOLIC PANEL
Anion gap: 10 (ref 5–15)
BUN: 17 mg/dL (ref 8–23)
CO2: 28 mmol/L (ref 22–32)
Calcium: 8.6 mg/dL — ABNORMAL LOW (ref 8.9–10.3)
Chloride: 99 mmol/L (ref 98–111)
Creatinine, Ser: 1.02 mg/dL (ref 0.61–1.24)
GFR, Estimated: >60 mL/min (ref >60)
Glucose, Bld: 114 mg/dL — ABNORMAL HIGH (ref 70–99)
Potassium: 3.1 mmol/L — ABNORMAL LOW (ref 3.5–5.1)
Sodium: 137 mmol/L (ref 135–145)

## 2021-09-19 LAB — HEPARIN LEVEL (UNFRACTIONATED): Heparin Unfractionated: 0.52 IU/mL (ref 0.30–0.70)

## 2021-09-19 MED ORDER — POTASSIUM CHLORIDE CRYS ER 20 MEQ PO TBCR
40.0000 meq | EXTENDED_RELEASE_TABLET | Freq: Two times a day (BID) | ORAL | Status: AC
  Administered 2021-09-19 (×2): 40 meq via ORAL
  Filled 2021-09-19 (×2): qty 2

## 2021-09-19 NOTE — Consult Note (Addendum)
WOC Nurse Consult Note: Reason for Consult:Per Dr. Delene Loll request, wound care is provided today Wound type: Venous insufficiency Pressure Injury POA: N/A Measurement: RLE:  9cm x 12cm x 0.1cm. Ruddy red wound bed. No nonviable tissue. LLE:  No wound Wound bed:A described above Drainage (amount, consistency, odor) Moderate serous Periwound: intact. Dressing procedure/placement/frequency: We do not carry the topical antimicrobial initiated at the outpatient wound care center on Wednesday, so I have substituted silver hydrofiber (Aquacel Ag+ Advantage). We will change dressings daily and I have provided guidance for Nursing for those changes.  Patient to follow up as scheduled/arranged at the outpatient wound care center with Dr. Dellia Nims.  [facility] nursing team will not follow, but will remain available to this patient, the nursing and medical teams.  Please re-consult if needed. Thanks, Maudie Flakes, MSN, RN, [place], Arther Abbott  Pager# [phone]

## 2021-09-19 NOTE — Progress Notes (Signed)
Dressings placed on patient's legs. Pt tolerated well.

## 2021-09-19 NOTE — Plan of Care (Signed)
°  Problem: Activity: Goal: Capacity to carry out activities will improve 09/19/2021 2154 by Barton Dubois, RN Outcome: Progressing 09/19/2021 2153 by Barton Dubois, RN Outcome: Progressing   Problem: Clinical Measurements: Goal: Diagnostic test results will improve 09/19/2021 2154 by Barton Dubois, RN Outcome: Progressing 09/19/2021 2153 by Barton Dubois, RN Outcome: Progressing

## 2021-09-19 NOTE — Progress Notes (Signed)
Primary Cardiologist:  [name]   Subjective:  Denies SSCP, palpitations or Dyspnea  Objective:  Vitals:   09/18/21 1932 09/19/21 0449 09/19/21 0700 09/19/21 0845  BP: 115/67 (!) 125/94  105/64  Pulse: (!) 48 82  85  Resp: 18 18  (!) 21  Temp: 97.6 F (36.4 C) 98 F (36.7 C)    TempSrc: Oral Oral    SpO2: 96% 100%    Weight:   76.1 kg   Height:        Intake/Output from previous day:  Intake/Output Summary (Last 24 hours) at 09/19/2021 0847 Last data filed at 09/19/2021 0300 Gross per 24 hour  Intake 746.16 ml  Output 2300 ml  Net -1553.84 ml    Physical Exam:  Elderly black male Mild basilar rales SEM JVP elevated PMI prominent  Abdomen benign Chronic eczema legs wrapped  Good right radial pulse  Left foot drop   Lab Results: Basic Metabolic Panel: Recent Labs    09/18/21 0147 09/19/21 0221  NA 140 137  K 3.5 3.1*  CL 103 99  CO2 29 28  GLUCOSE 128* 114*  BUN 15 17  CREATININE 1.05 1.02  CALCIUM 9.2 8.6*   Liver Function Tests: Recent Labs    09/18/21 0147  AST 42*  ALT 33  ALKPHOS 86  BILITOT 2.5*  PROT 6.6  ALBUMIN 3.5   No results for input(s): LIPASE, AMYLASE in the last 72 hours. CBC: Recent Labs    09/18/21 0147 09/19/21 0221  WBC 5.2 5.1  HGB 12.4* 12.9*  HCT 39.4 40.7  MCV 83.3 83.2  PLT 183 181    D-Dimer: Recent Labs    09/17/21 1317  DDIMER 0.44   Hemoglobin A1C: Recent Labs    09/17/21 1723  HGBA1C 7.0*     Imaging: DG Chest 2 View  Result Date: 09/17/2021 CLINICAL DATA:  Patient with chest pain. EXAM: CHEST - 2 VIEW COMPARISON:  Chest radiograph 02/21/2021. FINDINGS: Stable cardiomegaly. No large area pulmonary consolidation. Minimal basilar atelectasis on the lateral view. Suggestion of a possible small effusion. No pneumothorax. Thoracic spine degenerative changes. IMPRESSION: Cardiomegaly.  Possible small effusions. Electronically Signed   By: Lovey Newcomer M.D.   On: 09/17/2021 10:12   ECHOCARDIOGRAM  COMPLETE  Result Date: 09/18/2021    ECHOCARDIOGRAM REPORT   Patient Name:   Vincent [name] Date of Exam: 09/18/2021 Medical Rec #:  [id number]      Height:       69.0 in Accession #:    [id number]     Weight:       169.9 lb Date of Birth:  1935/11/30       BSA:          1.928 m Patient Age:    86 years       BP:           116/77 mmHg Patient Gender: M              HR:           54 bpm. Exam Location:  Inpatient Procedure: 2D Echo, Cardiac Doppler and Color Doppler Indications:    Dyspnea  History:        Patient has prior history of Echocardiogram examinations, most                 recent 03/18/2014. CAD, Arrythmias:Atrial Fibrillation; Risk                 Factors:Diabetes and  Former Smoker. S/P PCI. ETOH abuse. History                 of CVA.  Sonographer:    Clayton Lefort RDCS (AE) Referring Phys: [place]  1. Left ventricular ejection fraction, by estimation, is 25%. The left ventricle has normal function. The left ventricle demonstrates global hypokinesis. Left ventricular diastolic parameters are indeterminate.  2. Right ventricular systolic function is normal. The right ventricular size is normal. There is normal pulmonary artery systolic pressure.  3. Left atrial size was mildly dilated.  4. The mitral valve is abnormal. Mild to moderate mitral valve regurgitation. No evidence of mitral stenosis.  5. Tricuspid valve regurgitation is moderate.  6. The aortic valve is tricuspid. There is mild calcification of the aortic valve. Aortic valve regurgitation is not visualized. Aortic valve sclerosis is present, with no evidence of aortic valve stenosis.  7. Aortic dilatation noted. There is mild dilatation of the ascending aorta, measuring 38 mm.  8. The inferior vena cava is dilated in size with >50% respiratory variability, suggesting right atrial pressure of 8 mmHg. FINDINGS  Left Ventricle: Left ventricular ejection fraction, by estimation, is 25%. The left ventricle has normal function. The  left ventricle demonstrates global hypokinesis. The left ventricular internal cavity size was normal in size. There is no left ventricular hypertrophy. Left ventricular diastolic parameters are indeterminate. Right Ventricle: The right ventricular size is normal. No increase in right ventricular wall thickness. Right ventricular systolic function is normal. There is normal pulmonary artery systolic pressure. The tricuspid regurgitant velocity is 2.36 m/s, and  with an assumed right atrial pressure of 8 mmHg, the estimated right ventricular systolic pressure is 78.2 mmHg. Left Atrium: Left atrial size was mildly dilated. Right Atrium: Right atrial size was normal in size. Pericardium: There is no evidence of pericardial effusion. Mitral Valve: The mitral valve is abnormal. There is mild thickening of the mitral valve leaflet(s). Mild to moderate mitral valve regurgitation. No evidence of mitral valve stenosis. MV peak gradient, 5.8 mmHg. The mean mitral valve gradient is 1.0 mmHg. Tricuspid Valve: The tricuspid valve is normal in structure. Tricuspid valve regurgitation is moderate . No evidence of tricuspid stenosis. Aortic Valve: The aortic valve is tricuspid. There is mild calcification of the aortic valve. Aortic valve regurgitation is not visualized. Aortic valve sclerosis is present, with no evidence of aortic valve stenosis. Aortic valve mean gradient measures 3.0 mmHg. Aortic valve peak gradient measures 4.8 mmHg. Aortic valve area, by VTI measures 1.68 cm. Pulmonic Valve: The pulmonic valve was normal in structure. Pulmonic valve regurgitation is not visualized. No evidence of pulmonic stenosis. Aorta: Aortic dilatation noted. There is mild dilatation of the ascending aorta, measuring 38 mm. Venous: The inferior vena cava is dilated in size with greater than 50% respiratory variability, suggesting right atrial pressure of 8 mmHg. IAS/Shunts: No atrial level shunt detected by color flow Doppler.  LEFT  VENTRICLE PLAX 2D LVIDd:         6.80 cm LVIDs:         6.10 cm LV PW:         1.30 cm LV IVS:        0.90 cm LVOT diam:     2.10 cm LV SV:         36 LV SV Index:   19 LVOT Area:     3.46 cm  RIGHT VENTRICLE  IVC RV S prime:     6.68 cm/s  IVC diam: 2.10 cm TAPSE (M-mode): 1.2 cm LEFT ATRIUM              Index        RIGHT ATRIUM           Index LA diam:        4.00 cm  2.08 cm/m   RA Area:     27.80 cm LA Vol (A2C):   100.0 ml 51.88 ml/m  RA Volume:   110.00 ml 57.06 ml/m LA Vol (A4C):   110.0 ml 57.06 ml/m LA Biplane Vol: 106.0 ml 54.99 ml/m  AORTIC VALVE AV Area (Vmax):    1.97 cm AV Area (Vmean):   1.75 cm AV Area (VTI):     1.68 cm AV Vmax:           109.00 cm/s AV Vmean:          73.000 cm/s AV VTI:            0.212 m AV Peak Grad:      4.8 mmHg AV Mean Grad:      3.0 mmHg LVOT Vmax:         62.00 cm/s LVOT Vmean:        36.900 cm/s LVOT VTI:          0.103 m LVOT/AV VTI ratio: 0.49  AORTA Ao Root diam: 3.40 cm Ao Asc diam:  3.80 cm MITRAL VALVE                  TRICUSPID VALVE MV Area VTI:  1.06 cm        TR Peak grad:   22.3 mmHg MV Peak grad: 5.8 mmHg        TR Vmax:        236.00 cm/s MV Mean grad: 1.0 mmHg MV Vmax:      1.20 m/s        SHUNTS MV Vmean:     51.3 cm/s       Systemic VTI:  0.10 m MR Peak grad:    55.1 mmHg    Systemic Diam: 2.10 cm MR Mean grad:    33.0 mmHg MR Vmax:         371.00 cm/s MR Vmean:        267.0 cm/s MR PISA:         2.26 cm MR PISA Eff ROA: 21 mm MR PISA Radius:  0.60 cm Jenkins Rouge MD Electronically signed by Jenkins Rouge MD Signature Date/Time: 09/18/2021/1:21:13 PM    Final     Cardiac Studies:  ECG: Afib anterolateral T wave inversions    Telemetry: afib rates 80-90   Echo: EF 25% global significant change from previous RV normal mild/moderate MR   Medications:    aspirin  325 mg Oral Daily   atorvastatin  40 mg Oral Daily   brimonidine  1 drop Both Eyes BID   carvedilol  3.125 mg Oral BID WC   empagliflozin  25 mg Oral Daily    furosemide  40 mg Intravenous Q12H   insulin aspart  0-9 Units Subcutaneous TID WC   isosorbide mononitrate  15 mg Oral Daily   potassium chloride  40 mEq Oral BID   sodium chloride flush  3 mL Intravenous Q12H   spironolactone  25 mg Oral Daily   tamsulosin  0.4 mg Oral Daily      heparin 1,050 Units/hr (09/19/21 0014)  Assessment/Plan:  SEMI:  known 2 vessel CAD with distant stent to mid RCA. Pain free with new ECG changes anterolateral T inversions. Continue ASA/heparin will need right and left cath Monday Risks including stroke, surgery, bleeding contrast reaction and intubation discussed with patient and sister willing to proceed Orders written on add on board  CHF:  He appears euvolemic except for legs which are chronically abnormal Sees wound center for eczema lesions. K 3.1 Cr 1.08 would add aldactone 25 mm daily Will need to add ARB/ARNI post cath after assessing filling pressures and contrast given Afib continue coreg for rate control transition to heparin for cath would see if he is willing to use DOAC post cath He may require triple Rx if new stent put in and this would be more difficult with coumadin  Skin:  chronic LE ulcers from eczema needs some diuretic has routine f/u with wound center  Jenkins Rouge 09/19/2021, 8:47 AM

## 2021-09-19 NOTE — Plan of Care (Signed)
°  Problem: Activity: Goal: Capacity to carry out activities will improve Outcome: Progressing   Problem: Clinical Measurements: Goal: Diagnostic test results will improve Outcome: Progressing

## 2021-09-19 NOTE — Progress Notes (Signed)
Pt c/o stomach discomfort. States he thinks it is indigestion. Pt given Maalox. States discomfort is gone.

## 2021-09-19 NOTE — Progress Notes (Signed)
[facility] for warfarin >> IV heparin Indication: atrial fibrillation / ACS   No Known Allergies  Patient Measurements: Height: 5\' 9"  (175.3 cm) Weight: 76.1 kg (167 lb 11.2 oz) (scale c) IBW/kg (Calculated) : 70.7  Vital Signs: Temp: 98 F (36.7 C) 2022/10/15 0449) Temp Source: Oral 10-15-22 0449) BP: 125/94 Oct 15, 2022 0449) Pulse Rate: 82 10/15/22 0449)  Labs: Recent Labs    09/17/21 0952 09/17/21 1206 09/17/21 1317 09/17/21 1723 09/17/21 2108 09/18/21 0147 09/18/21 0502 09/18/21 0754 09/18/21 1538 09/19/21 0221  HGB 12.2*  --   --   --   --  12.4*  --   --   --  12.9*  HCT 39.0  --   --   --   --  39.4  --   --   --  40.7  PLT 193  --   --   --   --  183  --   --   --  181  LABPROT  --   --  18.1*  --   --  17.5*  --   --   --   --   INR  --   --  1.5*  --   --  1.4*  --   --   --   --   HEPARINUNFRC  --   --   --   --   --   --   --  0.44 0.42 0.52  CREATININE 1.14  --   --   --   --  1.05  --   --   --  1.02  TROPONINIHS 14   < >  --    < > 1,360* 1,624* 1,752*  --   --   --    < > = values in this interval not displayed.     Estimated Creatinine Clearance: 54.9 mL/min (by C-G formula based on SCr of 1.02 mg/dL).   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 s/p remote stent to Cx 2004 with chronic stable angina in context of residual circumflex disease.   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 Pharmacy initially consulted to give warfarin but troponins trending up, switched to IV heparin due to concern for ACS. Warfarin d/c'ed.  -Per RN, there was bleeding from the IV 1/21 and a new IV had to be started. No issues from new line -heparin level remains therapeutic on 1050 units/hr - CBC stable    Goal of  Therapy:  Heparin level 0.3-0.7 units/ml Monitor platelets by anticoagulation protocol: Yes   Plan:  -Continue heparin at 1050 units/hr -Monitor daily HL, CBC    Guila Owensby E. Lynnetta Tom, Student Pharmacist  **Pharmacist phone directory can now be found on [email or web address] ([email or web address]).  Listed under [place].

## 2021-09-19 NOTE — Assessment & Plan Note (Addendum)
CAD NSTEM with demand ischemia. Patient was anticoagulated with heparin and underwent cardiac catheterization. Coronaries with stable disease, patent stent at the mid RCA.  Plan to continue with anticoagulation and statin therapy.

## 2021-09-19 NOTE — Assessment & Plan Note (Addendum)
Ischemic cardiomyopathy Alcoholic CM Patient was admitted to the cardiac ward and was diuresed with IV furosemide.  Negative fluid balance was achieved, - 9,558 ml with improvement in his symptoms.   Further work up with echocardiography showed LV EF 25%, with global hypokinesis, RV systolic function was preserved. Mild to moderate mitral valve regurgitation, moderate tricuspid valve regurgitation. Ascending aorta dilatation 38 mm.   Cardiac catheterization patent stent to the mid rca, borderline obstructive disease distal L Cx. PCWP 22 mmHg. CO 2,6  Moderate pulmonary HTN with mean 39 mmHg   Heart failure regimen has been modified with digoxin and entresto, continue taking carvedilol and spironolactone. Dose of furosemide increased to 40 mg daily.

## 2021-09-19 NOTE — Consult Note (Addendum)
[facility] Nurse Consult Note: Reason for Consult:Chronic, nonhealing wounds to bilateral LEs. Patient seen in the outpatient [facility] by Dr. Laurene Footman on Wednesday, 09/15/21 where it is noted that the recently (1 week prior) wound that had healed on RLE had reopened due to patient not wearing the prescribed compression hosiery.  The RLE wound on 09/15/21 measured 9 x 22 x 0.1cm. The left dorsal foot wound was nearly healed.  Topical care (IodoFlex to RLE and Aquacel Ag+ Advantage to LLE) were placed and these dressings secured with a dry boot (Kerlix roll gauze/Coban/stockinet).  This dry boot is to be left in place until WED, 1/25.  If patient is still in house on this day, WOC nursing will take down dressing, reassess and redress wounds. If patient is discharged prior to 1/25, he is to keep the follow up appointment with Dr. Janalyn Rouse APP, Jeri Cos, PA-C.  The [facility] nursing team will follow, see on 1/25 and will remain available to this patient, the nursing and medical teams.   Thanks, Maudie Flakes, MSN, RN, [place], Arther Abbott  Pager# [phone]

## 2021-09-19 NOTE — Assessment & Plan Note (Addendum)
His glucose remained stable, at discharge will resume metformin. He did received insulin sliding scale during hid hospitalization.

## 2021-09-19 NOTE — Progress Notes (Signed)
PROGRESS NOTE    Vincent [name]  TDD:[id number] DOB: 1935/09/11 DOA: 09/17/2021 PCP: [name], MD  Brief Narrative:86/M with history of CAD, remote PCI and stent in 6237, chronic systolic CHF EF of 62%, chronic stable angina, alcoholic cardiomyopathy, paroxysmal A. fib on Coumadin, type 2 diabetes mellitus, history of CVA, chronic left foot drop and chronic bilateral leg wounds presented to the ED 1/20 with chest pain and shortness of breath. -EKG noted LVH with repolarization abnormalities, troponin was normal initially, trended up to 1700 today, chest x-ray noted cardiomegaly and small pleural effusions. -Troponin trended up to 1700, echo with drop in EF down to 25% -Cardiology consulting, started on IV heparin, plan for left heart cath, improving with diuresis   Subjective: -Feels better overall, no issues overnight, denies any chest pain  Assessment & Plan: * Acute on chronic systolic CHF (congestive heart failure) ([place])- (present on admission) Ischemic cardiomyopathy Alcoholic CM -Previously EF around 40-45% -Diuresing well on IV Lasix he is 5.3L negative -continue Aldactone and Coreg -Appears close to euvolemic, hold additional IV Lasix this evening -Echo with EF down to 25% now  Chronic leg wounds- (present on admission) Followed at the wound center, [facility] consulted  Type 2 diabetes mellitus with foot ulcer ([place])- (present on admission) CBG stable, metformin on hold, sliding scale insulin  NSTEMI (non-ST elevated myocardial infarction) ([place])- (present on admission) CAD -remote PCI/stent, 2004 -Cardiac cath 12/16 noted patent RCA stent, distal circumflex with 75% stenosis, medical management recommended then, also has 60% mid RCA and proximal RCA lesions -continue aspirin, statin, Coreg, imdur -Coumadin held, now on IV heparin, 2D echo with EF down to 25%,  -cardiology consulting, plan for left heart cath tomorrow   PAROXYSMAL ATRIAL FIBRILLATION- (present on  admission) Heart rate controlled, continue carvedilol -Coumadin on hold, now on IV heparin    DVT prophylaxis: IV heparin Code Status: Full code Family Communication: Discussed with patient in detail, no family at bedside Disposition Plan:  Inpatient  Consultants:  Cardiology  Procedures:   Antimicrobials:    Objective: Vitals:   09/18/21 1932 09/19/21 0449 09/19/21 0700 09/19/21 0845  BP: 115/67 (!) 125/94  105/64  Pulse: (!) 48 82  85  Resp: 18 18  (!) 21  Temp: 97.6 F (36.4 C) 98 F (36.7 C)    TempSrc: Oral Oral    SpO2: 96% 100%  100%  Weight:   76.1 kg   Height:        Intake/Output Summary (Last 24 hours) at 09/19/2021 1102 Last data filed at 09/19/2021 0850 Gross per 24 hour  Intake 266.16 ml  Output 2950 ml  Net -2683.84 ml   Filed Weights   09/17/21 2055 09/18/21 0603 09/19/21 0700  Weight: 79.5 kg 77.1 kg 76.1 kg    Examination:  General exam: AAOx3, no distress Respiratory system: Decreased breath sounds to bases otherwise clear Cardiovascular system: S1 & S2 heard, RRR.  Abd: nondistended, soft and nontender.Normal bowel sounds heard. Central nervous system: Alert and oriented. No focal neurological deficits. Extremities: Trace edema, Unna boots, dressing on both legs Skin: As above Psychiatry:  Mood & affect appropriate.     Data Reviewed:   CBC: Recent Labs  Lab 09/17/21 0952 09/18/21 0147 09/19/21 0221  WBC 4.2 5.2 5.1  HGB 12.2* 12.4* 12.9*  HCT 39.0 39.4 40.7  MCV 85.7 83.3 83.2  PLT 193 183 831   Basic Metabolic Panel: Recent Labs  Lab 09/17/21 0952 09/18/21 0147 09/19/21 0221  NA 138  140 137  K 3.5 3.5 3.1*  CL 104 103 99  CO2 27 29 28   GLUCOSE 193* 128* 114*  BUN 16 15 17   CREATININE 1.14 1.05 1.02  CALCIUM 8.9 9.2 8.6*   GFR: Estimated Creatinine Clearance: 54.9 mL/min (by C-G formula based on SCr of 1.02 mg/dL). Liver Function Tests: Recent Labs  Lab 09/18/21 0147  AST 42*  ALT 33  ALKPHOS 86   BILITOT 2.5*  PROT 6.6  ALBUMIN 3.5   No results for input(s): LIPASE, AMYLASE in the last 168 hours. No results for input(s): AMMONIA in the last 168 hours. Coagulation Profile: Recent Labs  Lab 09/17/21 1317 09/18/21 0147  INR 1.5* 1.4*   Cardiac Enzymes: No results for input(s): CKTOTAL, CKMB, CKMBINDEX, TROPONINI in the last 168 hours. BNP (last 3 results) No results for input(s): PROBNP in the last 8760 hours. HbA1C: Recent Labs    09/17/21 1723  HGBA1C 7.0*   CBG: Recent Labs  Lab 09/18/21 0624 09/18/21 1110 09/18/21 1618 09/18/21 2155 09/19/21 0624  GLUCAP 145* 160* 187* 119* 116*   Lipid Profile: No results for input(s): CHOL, HDL, LDLCALC, TRIG, CHOLHDL, LDLDIRECT in the last 72 hours. Thyroid Function Tests: No results for input(s): TSH, T4TOTAL, FREET4, T3FREE, THYROIDAB in the last 72 hours. Anemia Panel: No results for input(s): VITAMINB12, FOLATE, FERRITIN, TIBC, IRON, RETICCTPCT in the last 72 hours. Urine analysis:    Component Value Date/Time   COLORURINE STRAW (A) 03/05/2017 1120   APPEARANCEUR CLEAR 03/05/2017 1120   LABSPEC 1.005 03/05/2017 1120   PHURINE 6.0 03/05/2017 1120   GLUCOSEU NEGATIVE 03/05/2017 1120   HGBUR NEGATIVE 03/05/2017 1120   BILIRUBINUR NEGATIVE 03/05/2017 1120   KETONESUR NEGATIVE 03/05/2017 1120   PROTEINUR NEGATIVE 03/05/2017 1120   UROBILINOGEN 0.2 03/05/2015 1115   NITRITE NEGATIVE 03/05/2017 1120   LEUKOCYTESUR NEGATIVE 03/05/2017 1120   Sepsis Labs: @LABRCNTIP (procalcitonin:4,lacticidven:4)  ) Recent Results (from the past 240 hour(s))  Resp Panel by RT-PCR (Flu A&B, Covid) Nasopharyngeal Swab     Status: None   Collection Time: 09/17/21 12:54 PM   Specimen: Nasopharyngeal Swab; Nasopharyngeal(NP) swabs in vial transport medium  Result Value Ref Range Status   SARS Coronavirus 2 by RT PCR NEGATIVE NEGATIVE Final    Comment: (NOTE) SARS-CoV-2 target nucleic acids are NOT DETECTED.  The SARS-CoV-2 RNA  is generally detectable in upper respiratory specimens during the acute phase of infection. The lowest concentration of SARS-CoV-2 viral copies this assay can detect is 138 copies/mL. A negative result does not preclude SARS-Cov-2 infection and should not be used as the sole basis for treatment or other patient management decisions. A negative result may occur with  improper specimen collection/handling, submission of specimen other than nasopharyngeal swab, presence of viral mutation(s) within the areas targeted by this assay, and inadequate number of viral copies(<138 copies/mL). A negative result must be combined with clinical observations, patient history, and epidemiological information. The expected result is Negative.  Fact Sheet for Patients:  EntrepreneurPulse.com.au  Fact Sheet for Healthcare Providers:  IncredibleEmployment.be  This test is no t yet approved or cleared by the [place] FDA and  has been authorized for detection and/or diagnosis of SARS-CoV-2 by FDA under an Emergency Use Authorization (EUA). This EUA will remain  in effect (meaning this test can be used) for the duration of the COVID-19 declaration under Section 564(b)(1) of the Act, 21 U.S.C.section 360bbb-3(b)(1), unless the authorization is terminated  or revoked sooner.       Influenza  A by PCR NEGATIVE NEGATIVE Final   Influenza B by PCR NEGATIVE NEGATIVE Final    Comment: (NOTE) The Xpert Xpress SARS-CoV-2/FLU/RSV plus assay is intended as an aid in the diagnosis of influenza from Nasopharyngeal swab specimens and should not be used as a sole basis for treatment. Nasal washings and aspirates are unacceptable for Xpert Xpress SARS-CoV-2/FLU/RSV testing.  Fact Sheet for Patients: EntrepreneurPulse.com.au  Fact Sheet for Healthcare Providers: IncredibleEmployment.be  This test is not yet approved or cleared by the  [place] FDA and has been authorized for detection and/or diagnosis of SARS-CoV-2 by FDA under an Emergency Use Authorization (EUA). This EUA will remain in effect (meaning this test can be used) for the duration of the COVID-19 declaration under Section 564(b)(1) of the Act, 21 U.S.C. section 360bbb-3(b)(1), unless the authorization is terminated or revoked.  Performed at [facility], [place] [address] [place], [place] [address]      Radiology Studies: ECHOCARDIOGRAM COMPLETE  Result Date: 09/18/2021    ECHOCARDIOGRAM REPORT   Patient Name:   Vincent [name] Date of Exam: 09/18/2021 Medical Rec #:  [id number]      Height:       69.0 in Accession #:    [id number]     Weight:       169.9 lb Date of Birth:  1936-08-02       BSA:          1.928 m Patient Age:    37 years       BP:           116/77 mmHg Patient Gender: M              HR:           54 bpm. Exam Location:  Inpatient Procedure: 2D Echo, Cardiac Doppler and Color Doppler Indications:    Dyspnea  History:        Patient has prior history of Echocardiogram examinations, most                 recent 03/18/2014. CAD, Arrythmias:Atrial Fibrillation; Risk                 Factors:Diabetes and Former Smoker. S/P PCI. ETOH abuse. History                 of CVA.  Sonographer:    Clayton Lefort RDCS (AE) Referring Phys: [place]  1. Left ventricular ejection fraction, by estimation, is 25%. The left ventricle has normal function. The left ventricle demonstrates global hypokinesis. Left ventricular diastolic parameters are indeterminate.  2. Right ventricular systolic function is normal. The right ventricular size is normal. There is normal pulmonary artery systolic pressure.  3. Left atrial size was mildly dilated.  4. The mitral valve is abnormal. Mild to moderate mitral valve regurgitation. No evidence of mitral stenosis.  5. Tricuspid valve regurgitation is moderate.  6. The aortic valve is tricuspid. There is mild  calcification of the aortic valve. Aortic valve regurgitation is not visualized. Aortic valve sclerosis is present, with no evidence of aortic valve stenosis.  7. Aortic dilatation noted. There is mild dilatation of the ascending aorta, measuring 38 mm.  8. The inferior vena cava is dilated in size with >50% respiratory variability, suggesting right atrial pressure of 8 mmHg. FINDINGS  Left Ventricle: Left ventricular ejection fraction, by estimation, is 25%. The left ventricle has normal function. The left ventricle demonstrates global hypokinesis. The left ventricular internal  cavity size was normal in size. There is no left ventricular hypertrophy. Left ventricular diastolic parameters are indeterminate. Right Ventricle: The right ventricular size is normal. No increase in right ventricular wall thickness. Right ventricular systolic function is normal. There is normal pulmonary artery systolic pressure. The tricuspid regurgitant velocity is 2.36 m/s, and  with an assumed right atrial pressure of 8 mmHg, the estimated right ventricular systolic pressure is 16.1 mmHg. Left Atrium: Left atrial size was mildly dilated. Right Atrium: Right atrial size was normal in size. Pericardium: There is no evidence of pericardial effusion. Mitral Valve: The mitral valve is abnormal. There is mild thickening of the mitral valve leaflet(s). Mild to moderate mitral valve regurgitation. No evidence of mitral valve stenosis. MV peak gradient, 5.8 mmHg. The mean mitral valve gradient is 1.0 mmHg. Tricuspid Valve: The tricuspid valve is normal in structure. Tricuspid valve regurgitation is moderate . No evidence of tricuspid stenosis. Aortic Valve: The aortic valve is tricuspid. There is mild calcification of the aortic valve. Aortic valve regurgitation is not visualized. Aortic valve sclerosis is present, with no evidence of aortic valve stenosis. Aortic valve mean gradient measures 3.0 mmHg. Aortic valve peak gradient measures 4.8  mmHg. Aortic valve area, by VTI measures 1.68 cm. Pulmonic Valve: The pulmonic valve was normal in structure. Pulmonic valve regurgitation is not visualized. No evidence of pulmonic stenosis. Aorta: Aortic dilatation noted. There is mild dilatation of the ascending aorta, measuring 38 mm. Venous: The inferior vena cava is dilated in size with greater than 50% respiratory variability, suggesting right atrial pressure of 8 mmHg. IAS/Shunts: No atrial level shunt detected by color flow Doppler.  LEFT VENTRICLE PLAX 2D LVIDd:         6.80 cm LVIDs:         6.10 cm LV PW:         1.30 cm LV IVS:        0.90 cm LVOT diam:     2.10 cm LV SV:         36 LV SV Index:   19 LVOT Area:     3.46 cm  RIGHT VENTRICLE            IVC RV S prime:     6.68 cm/s  IVC diam: 2.10 cm TAPSE (M-mode): 1.2 cm LEFT ATRIUM              Index        RIGHT ATRIUM           Index LA diam:        4.00 cm  2.08 cm/m   RA Area:     27.80 cm LA Vol (A2C):   100.0 ml 51.88 ml/m  RA Volume:   110.00 ml 57.06 ml/m LA Vol (A4C):   110.0 ml 57.06 ml/m LA Biplane Vol: 106.0 ml 54.99 ml/m  AORTIC VALVE AV Area (Vmax):    1.97 cm AV Area (Vmean):   1.75 cm AV Area (VTI):     1.68 cm AV Vmax:           109.00 cm/s AV Vmean:          73.000 cm/s AV VTI:            0.212 m AV Peak Grad:      4.8 mmHg AV Mean Grad:      3.0 mmHg LVOT Vmax:         62.00 cm/s LVOT Vmean:  36.900 cm/s LVOT VTI:          0.103 m LVOT/AV VTI ratio: 0.49  AORTA Ao Root diam: 3.40 cm Ao Asc diam:  3.80 cm MITRAL VALVE                  TRICUSPID VALVE MV Area VTI:  1.06 cm        TR Peak grad:   22.3 mmHg MV Peak grad: 5.8 mmHg        TR Vmax:        236.00 cm/s MV Mean grad: 1.0 mmHg MV Vmax:      1.20 m/s        SHUNTS MV Vmean:     51.3 cm/s       Systemic VTI:  0.10 m MR Peak grad:    55.1 mmHg    Systemic Diam: 2.10 cm MR Mean grad:    33.0 mmHg MR Vmax:         371.00 cm/s MR Vmean:        267.0 cm/s MR PISA:         2.26 cm MR PISA Eff ROA: 21 mm MR PISA  Radius:  0.60 cm Jenkins Rouge MD Electronically signed by Jenkins Rouge MD Signature Date/Time: 09/18/2021/1:21:13 PM    Final      Scheduled Meds:  aspirin  325 mg Oral Daily   atorvastatin  40 mg Oral Daily   brimonidine  1 drop Both Eyes BID   carvedilol  3.125 mg Oral BID WC   empagliflozin  25 mg Oral Daily   insulin aspart  0-9 Units Subcutaneous TID WC   isosorbide mononitrate  15 mg Oral Daily   potassium chloride  40 mEq Oral BID   sodium chloride flush  3 mL Intravenous Q12H   spironolactone  25 mg Oral Daily   tamsulosin  0.4 mg Oral Daily   Continuous Infusions:  heparin 1,050 Units/hr (09/19/21 0014)     LOS: 1 day    Time spent: 86min    Domenic Polite, MD [facility]   09/19/2021, 11:02 AM

## 2021-09-19 NOTE — H&P (View-Only) (Signed)
Primary Cardiologist:  [name]   Subjective:  Denies SSCP, palpitations or Dyspnea  Objective:  Vitals:   09/18/21 1932 09/19/21 0449 09/19/21 0700 09/19/21 0845  BP: 115/67 (!) 125/94  105/64  Pulse: (!) 48 82  85  Resp: 18 18  (!) 21  Temp: 97.6 F (36.4 C) 98 F (36.7 C)    TempSrc: Oral Oral    SpO2: 96% 100%    Weight:   76.1 kg   Height:        Intake/Output from previous day:  Intake/Output Summary (Last 24 hours) at 09/19/2021 0847 Last data filed at 09/19/2021 0300 Gross per 24 hour  Intake 746.16 ml  Output 2300 ml  Net -1553.84 ml    Physical Exam:  Elderly black male Mild basilar rales SEM JVP elevated PMI prominent  Abdomen benign Chronic eczema legs wrapped  Good right radial pulse  Left foot drop   Lab Results: Basic Metabolic Panel: Recent Labs    09/18/21 0147 09/19/21 0221  NA 140 137  K 3.5 3.1*  CL 103 99  CO2 29 28  GLUCOSE 128* 114*  BUN 15 17  CREATININE 1.05 1.02  CALCIUM 9.2 8.6*   Liver Function Tests: Recent Labs    09/18/21 0147  AST 42*  ALT 33  ALKPHOS 86  BILITOT 2.5*  PROT 6.6  ALBUMIN 3.5   No results for input(s): LIPASE, AMYLASE in the last 72 hours. CBC: Recent Labs    09/18/21 0147 09/19/21 0221  WBC 5.2 5.1  HGB 12.4* 12.9*  HCT 39.4 40.7  MCV 83.3 83.2  PLT 183 181    D-Dimer: Recent Labs    09/17/21 1317  DDIMER 0.44   Hemoglobin A1C: Recent Labs    09/17/21 1723  HGBA1C 7.0*     Imaging: DG Chest 2 View  Result Date: 09/17/2021 CLINICAL DATA:  Patient with chest pain. EXAM: CHEST - 2 VIEW COMPARISON:  Chest radiograph 02/21/2021. FINDINGS: Stable cardiomegaly. No large area pulmonary consolidation. Minimal basilar atelectasis on the lateral view. Suggestion of a possible small effusion. No pneumothorax. Thoracic spine degenerative changes. IMPRESSION: Cardiomegaly.  Possible small effusions. Electronically Signed   By: Lovey Newcomer M.D.   On: 09/17/2021 10:12   ECHOCARDIOGRAM  COMPLETE  Result Date: 09/18/2021    ECHOCARDIOGRAM REPORT   Patient Name:   Vincent [name] Date of Exam: 09/18/2021 Medical Rec #:  [id number]      Height:       69.0 in Accession #:    [id number]     Weight:       169.9 lb Date of Birth:  Jan 07, 1936       BSA:          1.928 m Patient Age:    86 years       BP:           116/77 mmHg Patient Gender: M              HR:           54 bpm. Exam Location:  Inpatient Procedure: 2D Echo, Cardiac Doppler and Color Doppler Indications:    Dyspnea  History:        Patient has prior history of Echocardiogram examinations, most                 recent 03/18/2014. CAD, Arrythmias:Atrial Fibrillation; Risk                 Factors:Diabetes and  Former Smoker. S/P PCI. ETOH abuse. History                 of CVA.  Sonographer:    Clayton Lefort RDCS (AE) Referring Phys: [place]  1. Left ventricular ejection fraction, by estimation, is 25%. The left ventricle has normal function. The left ventricle demonstrates global hypokinesis. Left ventricular diastolic parameters are indeterminate.  2. Right ventricular systolic function is normal. The right ventricular size is normal. There is normal pulmonary artery systolic pressure.  3. Left atrial size was mildly dilated.  4. The mitral valve is abnormal. Mild to moderate mitral valve regurgitation. No evidence of mitral stenosis.  5. Tricuspid valve regurgitation is moderate.  6. The aortic valve is tricuspid. There is mild calcification of the aortic valve. Aortic valve regurgitation is not visualized. Aortic valve sclerosis is present, with no evidence of aortic valve stenosis.  7. Aortic dilatation noted. There is mild dilatation of the ascending aorta, measuring 38 mm.  8. The inferior vena cava is dilated in size with >50% respiratory variability, suggesting right atrial pressure of 8 mmHg. FINDINGS  Left Ventricle: Left ventricular ejection fraction, by estimation, is 25%. The left ventricle has normal function. The  left ventricle demonstrates global hypokinesis. The left ventricular internal cavity size was normal in size. There is no left ventricular hypertrophy. Left ventricular diastolic parameters are indeterminate. Right Ventricle: The right ventricular size is normal. No increase in right ventricular wall thickness. Right ventricular systolic function is normal. There is normal pulmonary artery systolic pressure. The tricuspid regurgitant velocity is 2.36 m/s, and  with an assumed right atrial pressure of 8 mmHg, the estimated right ventricular systolic pressure is 83.6 mmHg. Left Atrium: Left atrial size was mildly dilated. Right Atrium: Right atrial size was normal in size. Pericardium: There is no evidence of pericardial effusion. Mitral Valve: The mitral valve is abnormal. There is mild thickening of the mitral valve leaflet(s). Mild to moderate mitral valve regurgitation. No evidence of mitral valve stenosis. MV peak gradient, 5.8 mmHg. The mean mitral valve gradient is 1.0 mmHg. Tricuspid Valve: The tricuspid valve is normal in structure. Tricuspid valve regurgitation is moderate . No evidence of tricuspid stenosis. Aortic Valve: The aortic valve is tricuspid. There is mild calcification of the aortic valve. Aortic valve regurgitation is not visualized. Aortic valve sclerosis is present, with no evidence of aortic valve stenosis. Aortic valve mean gradient measures 3.0 mmHg. Aortic valve peak gradient measures 4.8 mmHg. Aortic valve area, by VTI measures 1.68 cm. Pulmonic Valve: The pulmonic valve was normal in structure. Pulmonic valve regurgitation is not visualized. No evidence of pulmonic stenosis. Aorta: Aortic dilatation noted. There is mild dilatation of the ascending aorta, measuring 38 mm. Venous: The inferior vena cava is dilated in size with greater than 50% respiratory variability, suggesting right atrial pressure of 8 mmHg. IAS/Shunts: No atrial level shunt detected by color flow Doppler.  LEFT  VENTRICLE PLAX 2D LVIDd:         6.80 cm LVIDs:         6.10 cm LV PW:         1.30 cm LV IVS:        0.90 cm LVOT diam:     2.10 cm LV SV:         36 LV SV Index:   19 LVOT Area:     3.46 cm  RIGHT VENTRICLE  IVC RV S prime:     6.68 cm/s  IVC diam: 2.10 cm TAPSE (M-mode): 1.2 cm LEFT ATRIUM              Index        RIGHT ATRIUM           Index LA diam:        4.00 cm  2.08 cm/m   RA Area:     27.80 cm LA Vol (A2C):   100.0 ml 51.88 ml/m  RA Volume:   110.00 ml 57.06 ml/m LA Vol (A4C):   110.0 ml 57.06 ml/m LA Biplane Vol: 106.0 ml 54.99 ml/m  AORTIC VALVE AV Area (Vmax):    1.97 cm AV Area (Vmean):   1.75 cm AV Area (VTI):     1.68 cm AV Vmax:           109.00 cm/s AV Vmean:          73.000 cm/s AV VTI:            0.212 m AV Peak Grad:      4.8 mmHg AV Mean Grad:      3.0 mmHg LVOT Vmax:         62.00 cm/s LVOT Vmean:        36.900 cm/s LVOT VTI:          0.103 m LVOT/AV VTI ratio: 0.49  AORTA Ao Root diam: 3.40 cm Ao Asc diam:  3.80 cm MITRAL VALVE                  TRICUSPID VALVE MV Area VTI:  1.06 cm        TR Peak grad:   22.3 mmHg MV Peak grad: 5.8 mmHg        TR Vmax:        236.00 cm/s MV Mean grad: 1.0 mmHg MV Vmax:      1.20 m/s        SHUNTS MV Vmean:     51.3 cm/s       Systemic VTI:  0.10 m MR Peak grad:    55.1 mmHg    Systemic Diam: 2.10 cm MR Mean grad:    33.0 mmHg MR Vmax:         371.00 cm/s MR Vmean:        267.0 cm/s MR PISA:         2.26 cm MR PISA Eff ROA: 21 mm MR PISA Radius:  0.60 cm Jenkins Rouge MD Electronically signed by Jenkins Rouge MD Signature Date/Time: 09/18/2021/1:21:13 PM    Final     Cardiac Studies:  ECG: Afib anterolateral T wave inversions    Telemetry: afib rates 80-90   Echo: EF 25% global significant change from previous RV normal mild/moderate MR   Medications:    aspirin  325 mg Oral Daily   atorvastatin  40 mg Oral Daily   brimonidine  1 drop Both Eyes BID   carvedilol  3.125 mg Oral BID WC   empagliflozin  25 mg Oral Daily    furosemide  40 mg Intravenous Q12H   insulin aspart  0-9 Units Subcutaneous TID WC   isosorbide mononitrate  15 mg Oral Daily   potassium chloride  40 mEq Oral BID   sodium chloride flush  3 mL Intravenous Q12H   spironolactone  25 mg Oral Daily   tamsulosin  0.4 mg Oral Daily      heparin 1,050 Units/hr (09/19/21 0014)  Assessment/Plan:  SEMI:  known 2 vessel CAD with distant stent to mid RCA. Pain free with new ECG changes anterolateral T inversions. Continue ASA/heparin will need right and left cath Monday Risks including stroke, surgery, bleeding contrast reaction and intubation discussed with patient and sister willing to proceed Orders written on add on board  CHF:  He appears euvolemic except for legs which are chronically abnormal Sees wound center for eczema lesions. K 3.1 Cr 1.08 would add aldactone 25 mm daily Will need to add ARB/ARNI post cath after assessing filling pressures and contrast given Afib continue coreg for rate control transition to heparin for cath would see if he is willing to use DOAC post cath He may require triple Rx if new stent put in and this would be more difficult with coumadin  Skin:  chronic LE ulcers from eczema needs some diuretic has routine f/u with wound center  Jenkins Rouge 09/19/2021, 8:47 AM

## 2021-09-19 NOTE — Assessment & Plan Note (Addendum)
Rate remained well control with carvedilol.  Now patient on digoxin.  Anticoagulation has been changed to apixaban with good toleration

## 2021-09-19 NOTE — Assessment & Plan Note (Addendum)
Followed at the wound center, [facility] following, dressing changed 2022/10/13, usually changed weekly

## 2021-09-20 ENCOUNTER — Inpatient Hospital Stay (HOSPITAL_COMMUNITY)
Admission: EM | Disposition: A | Discharge status: Home Health | Payer: Self-pay | Source: Home / Self Care | Attending: Internal Medicine

## 2021-09-20 ENCOUNTER — Encounter (HOSPITAL_COMMUNITY): Payer: Self-pay | Admitting: Cardiology

## 2021-09-20 ENCOUNTER — Other Ambulatory Visit (HOSPITAL_COMMUNITY): Payer: Self-pay

## 2021-09-20 DIAGNOSIS — I214 Non-ST elevation (NSTEMI) myocardial infarction: Secondary | ICD-10-CM

## 2021-09-20 DIAGNOSIS — I2510 Atherosclerotic heart disease of native coronary artery without angina pectoris: Secondary | ICD-10-CM | POA: Diagnosis not present

## 2021-09-20 DIAGNOSIS — I5023 Acute on chronic systolic (congestive) heart failure: Secondary | ICD-10-CM | POA: Diagnosis not present

## 2021-09-20 DIAGNOSIS — R778 Other specified abnormalities of plasma proteins: Secondary | ICD-10-CM | POA: Diagnosis not present

## 2021-09-20 DIAGNOSIS — I482 Chronic atrial fibrillation, unspecified: Secondary | ICD-10-CM | POA: Diagnosis not present

## 2021-09-20 DIAGNOSIS — I4729 Other ventricular tachycardia: Secondary | ICD-10-CM | POA: Diagnosis not present

## 2021-09-20 DIAGNOSIS — I10 Essential (primary) hypertension: Secondary | ICD-10-CM

## 2021-09-20 DIAGNOSIS — I509 Heart failure, unspecified: Secondary | ICD-10-CM | POA: Diagnosis not present

## 2021-09-20 HISTORY — PX: RIGHT/LEFT HEART CATH AND CORONARY ANGIOGRAPHY: CATH118266

## 2021-09-20 LAB — MAGNESIUM: Magnesium: 1.9 mg/dL (ref 1.7–2.4)

## 2021-09-20 LAB — POCT I-STAT 7, (LYTES, BLD GAS, ICA,H+H)
Acid-Base Excess: 4 mmol/L — ABNORMAL HIGH (ref 0.0–2.0)
Bicarbonate: 27.5 mmol/L (ref 20.0–28.0)
Calcium, Ion: 1.17 mmol/L (ref 1.15–1.40)
HCT: 39 % (ref 39.0–52.0)
Hemoglobin: 13.3 g/dL (ref 13.0–17.0)
O2 Saturation: 99 %
Potassium: 3.8 mmol/L (ref 3.5–5.1)
Sodium: 138 mmol/L (ref 135–145)
TCO2: 29 mmol/L (ref 22–32)
pCO2 arterial: 38.6 mmHg (ref 32.0–48.0)
pH, Arterial: 7.462 — ABNORMAL HIGH (ref 7.350–7.450)
pO2, Arterial: 125 mmHg — ABNORMAL HIGH (ref 83.0–108.0)

## 2021-09-20 LAB — HEPARIN LEVEL (UNFRACTIONATED): Heparin Unfractionated: 0.71 IU/mL — ABNORMAL HIGH (ref 0.30–0.70)

## 2021-09-20 LAB — POCT I-STAT EG7
Acid-Base Excess: 4 mmol/L — ABNORMAL HIGH (ref 0.0–2.0)
Acid-Base Excess: 5 mmol/L — ABNORMAL HIGH (ref 0.0–2.0)
Bicarbonate: 29.4 mmol/L — ABNORMAL HIGH (ref 20.0–28.0)
Bicarbonate: 29.9 mmol/L — ABNORMAL HIGH (ref 20.0–28.0)
Calcium, Ion: 1.19 mmol/L (ref 1.15–1.40)
Calcium, Ion: 1.21 mmol/L (ref 1.15–1.40)
HCT: 39 % (ref 39.0–52.0)
HCT: 39 % (ref 39.0–52.0)
Hemoglobin: 13.3 g/dL (ref 13.0–17.0)
Hemoglobin: 13.3 g/dL (ref 13.0–17.0)
O2 Saturation: 71 %
O2 Saturation: 71 %
Potassium: 3.8 mmol/L (ref 3.5–5.1)
Potassium: 3.9 mmol/L (ref 3.5–5.1)
Sodium: 139 mmol/L (ref 135–145)
Sodium: 139 mmol/L (ref 135–145)
TCO2: 31 mmol/L (ref 22–32)
TCO2: 31 mmol/L (ref 22–32)
pCO2, Ven: 45.1 mmHg (ref 44.0–60.0)
pCO2, Ven: 46 mmHg (ref 44.0–60.0)
pH, Ven: 7.422 (ref 7.250–7.430)
pH, Ven: 7.422 (ref 7.250–7.430)
pO2, Ven: 37 mmHg (ref 32.0–45.0)
pO2, Ven: 37 mmHg (ref 32.0–45.0)

## 2021-09-20 LAB — BASIC METABOLIC PANEL
Anion gap: 5 (ref 5–15)
BUN: 17 mg/dL (ref 8–23)
CO2: 28 mmol/L (ref 22–32)
Calcium: 8.7 mg/dL — ABNORMAL LOW (ref 8.9–10.3)
Chloride: 104 mmol/L (ref 98–111)
Creatinine, Ser: 1.03 mg/dL (ref 0.61–1.24)
GFR, Estimated: >60 mL/min (ref >60)
Glucose, Bld: 128 mg/dL — ABNORMAL HIGH (ref 70–99)
Potassium: 4.1 mmol/L (ref 3.5–5.1)
Sodium: 137 mmol/L (ref 135–145)

## 2021-09-20 LAB — GLUCOSE, CAPILLARY
Glucose-Capillary: 146 mg/dL — ABNORMAL HIGH (ref 70–99)
Glucose-Capillary: 196 mg/dL — ABNORMAL HIGH (ref 70–99)
Glucose-Capillary: 144 mg/dL — ABNORMAL HIGH (ref 70–99)

## 2021-09-20 LAB — CBC
HCT: 41.4 % (ref 39.0–52.0)
Hemoglobin: 13.1 g/dL (ref 13.0–17.0)
MCH: 26.3 pg (ref 26.0–34.0)
MCHC: 31.6 g/dL (ref 30.0–36.0)
MCV: 83 fL (ref 80.0–100.0)
Platelets: 193 10*3/uL (ref 150–400)
RBC: 4.99 MIL/uL (ref 4.22–5.81)
RDW: 16.9 % — ABNORMAL HIGH (ref 11.5–15.5)
WBC: 6.3 10*3/uL (ref 4.0–10.5)
nRBC: 0 % (ref 0.0–0.2)

## 2021-09-20 SURGERY — RIGHT/LEFT HEART CATH AND CORONARY ANGIOGRAPHY
Anesthesia: LOCAL

## 2021-09-20 MED ORDER — SODIUM CHLORIDE 0.9% FLUSH: 3.0000 mL | INTRAVENOUS | Status: DC | PRN

## 2021-09-20 MED ORDER — LIDOCAINE HCL (PF) 1 % IJ SOLN
INTRAMUSCULAR | Status: DC | PRN
  Administered 2021-09-20 (×2): 2 mL

## 2021-09-20 MED ORDER — VERAPAMIL HCL 2.5 MG/ML IV SOLN
INTRAVENOUS | Status: AC
  Filled 2021-09-20: qty 2

## 2021-09-20 MED ORDER — SODIUM CHLORIDE 0.9 % IV SOLN: INTRAVENOUS | Status: DC

## 2021-09-20 MED ORDER — FUROSEMIDE 10 MG/ML IJ SOLN
40.0000 mg | Freq: Once | INTRAMUSCULAR | Status: AC
  Administered 2021-09-20: 40 mg via INTRAVENOUS
  Filled 2021-09-20: qty 4

## 2021-09-20 MED ORDER — MIDAZOLAM HCL 2 MG/2ML IJ SOLN
INTRAMUSCULAR | Status: DC | PRN
  Administered 2021-09-20: 1 mg via INTRAVENOUS

## 2021-09-20 MED ORDER — ASPIRIN 81 MG PO CHEW
81.0000 mg | CHEWABLE_TABLET | ORAL | Status: AC
  Administered 2021-09-20: 81 mg via ORAL
  Filled 2021-09-20: qty 1

## 2021-09-20 MED ORDER — SODIUM CHLORIDE 0.9 % IV SOLN: 250.0000 mL | INTRAVENOUS | Status: DC | PRN

## 2021-09-20 MED ORDER — FENTANYL CITRATE (PF) 100 MCG/2ML IJ SOLN
INTRAMUSCULAR | Status: DC | PRN
  Administered 2021-09-20: 25 ug via INTRAVENOUS

## 2021-09-20 MED ORDER — IOHEXOL 350 MG/ML SOLN
INTRAVENOUS | Status: DC | PRN
  Administered 2021-09-20: 55 mL

## 2021-09-20 MED ORDER — VERAPAMIL HCL 2.5 MG/ML IV SOLN
INTRAVENOUS | Status: DC | PRN
  Administered 2021-09-20: 10 mL via INTRA_ARTERIAL

## 2021-09-20 MED ORDER — LIDOCAINE HCL (PF) 1 % IJ SOLN
INTRAMUSCULAR | Status: AC
  Filled 2021-09-20: qty 30

## 2021-09-20 MED ORDER — APIXABAN 5 MG PO TABS
5.0000 mg | ORAL_TABLET | Freq: Two times a day (BID) | ORAL | Status: DC
  Administered 2021-09-20 – 2021-09-22 (×4): 5 mg via ORAL
  Filled 2021-09-20 (×4): qty 1

## 2021-09-20 MED ORDER — ASPIRIN EC 81 MG PO TBEC
81.0000 mg | DELAYED_RELEASE_TABLET | Freq: Every day | ORAL | Status: DC
  Administered 2021-09-21 – 2021-09-22 (×2): 81 mg via ORAL
  Filled 2021-09-20 (×2): qty 1

## 2021-09-20 MED ORDER — HEPARIN (PORCINE) IN NACL 1000-0.9 UT/500ML-% IV SOLN
INTRAVENOUS | Status: DC | PRN
  Administered 2021-09-20 (×2): 500 mL

## 2021-09-20 MED ORDER — HEPARIN (PORCINE) IN NACL 1000-0.9 UT/500ML-% IV SOLN
INTRAVENOUS | Status: AC
  Filled 2021-09-20: qty 500

## 2021-09-20 MED ORDER — SODIUM CHLORIDE 0.9% FLUSH
3.0000 mL | Freq: Two times a day (BID) | INTRAVENOUS | Status: DC
  Administered 2021-09-20 – 2021-09-22 (×3): 3 mL via INTRAVENOUS

## 2021-09-20 MED ORDER — MIDAZOLAM HCL 2 MG/2ML IJ SOLN
INTRAMUSCULAR | Status: AC
  Filled 2021-09-20: qty 2

## 2021-09-20 MED ORDER — ASPIRIN 81 MG PO CHEW: 81.0000 mg | CHEWABLE_TABLET | ORAL | Status: DC

## 2021-09-20 MED ORDER — HEPARIN SODIUM (PORCINE) 1000 UNIT/ML IJ SOLN
INTRAMUSCULAR | Status: AC
  Filled 2021-09-20: qty 10

## 2021-09-20 MED ORDER — FENTANYL CITRATE (PF) 100 MCG/2ML IJ SOLN
INTRAMUSCULAR | Status: AC
  Filled 2021-09-20: qty 2

## 2021-09-20 SURGICAL SUPPLY — 12 items
CATH 5FR JL3.5 JR4 ANG PIG MP (CATHETERS) ×2 IMPLANT
CATH BALLN WEDGE 5F 110CM (CATHETERS) ×2 IMPLANT
DEVICE RAD COMP TR BAND LRG (VASCULAR PRODUCTS) ×2 IMPLANT
GLIDESHEATH SLEND SS 6F .021 (SHEATH) ×2 IMPLANT
GUIDEWIRE INQWIRE 1.5J.035X260 (WIRE) IMPLANT
INQWIRE 1.5J .035X260CM (WIRE) ×3
KIT HEART LEFT (KITS) ×3 IMPLANT
PACK CARDIAC CATHETERIZATION (CUSTOM PROCEDURE TRAY) ×3 IMPLANT
SHEATH GLIDE SLENDER 4/5FR (SHEATH) ×2 IMPLANT
SYR MEDRAD MARK 7 150ML (SYRINGE) ×3 IMPLANT
TRANSDUCER W/STOPCOCK (MISCELLANEOUS) ×3 IMPLANT
TUBING CIL FLEX 10 FLL-RA (TUBING) ×3 IMPLANT

## 2021-09-20 NOTE — Progress Notes (Addendum)
PROGRESS NOTE    KAIAN FAHS  ZOX:[id number] DOB: [birth year] DOA: 09/17/2021 PCP: [name], MD  Brief Narrative:86/M with history of CAD, remote PCI and stent in 8119, chronic systolic CHF EF of 14%, chronic stable angina, alcoholic cardiomyopathy, paroxysmal A. fib on Coumadin, type 2 diabetes mellitus, history of CVA, chronic left foot drop and chronic bilateral leg wounds presented to the ED 1/20 with chest pain and shortness of breath. -EKG noted LVH with repolarization abnormalities, troponin was normal initially, trended up to 1700 today, chest x-ray noted cardiomegaly and small pleural effusions. -Troponin trended up to 1700, echo with drop in EF down to 25% -Cardiology consulting, started on IV heparin, plan for left heart cath, improving with diuresis   Subjective: -Feels better overall, no issues overnight, denies any chest pain  Assessment & Plan: * Acute on chronic systolic CHF (congestive heart failure) ([place])- (present on admission) Ischemic cardiomyopathy Alcoholic CM -Previously EF around 40-45% -Diuresed well on IV Lasix he is 6.7 L negative -continue Aldactone and Coreg -Appears close to euvolemic, restart Lasix tomorrow -Echo with EF down to 25% now -Cath today  NSTEMI (non-ST elevated myocardial infarction) ([place])- (present on admission) CAD -remote PCI/stent, 2004 -Cardiac cath 12/16 noted patent RCA stent, distal circumflex with 75% stenosis, medical management recommended then, also has 60% mid RCA and proximal RCA lesions -continue aspirin, statin, Coreg, imdur -Coumadin held, now on IV heparin, 2D echo with EF down to 25%,  -cardiology consulting, for cath today   PAROXYSMAL ATRIAL FIBRILLATION- (present on admission) Heart rate controlled, continue carvedilol -Coumadin on hold, now on IV heparin  Chronic leg wounds- (present on admission) Followed at the wound center, [facility] following, dressing changed yesterday  Type 2 diabetes mellitus with foot  ulcer ([place])- (present on admission) CBG stable, metformin on hold, sliding scale insulin  NSVT (nonsustained ventricular tachycardia)-resolved as of 09/20/2021 Continue beta-blocker, electrolytes are stable    DVT prophylaxis: IV heparin Code Status: Full code Family Communication: Discussed with patient in detail, no family at bedside Disposition Plan:  Inpatient  Consultants:  Cardiology  Procedures:   Antimicrobials:    Objective: Vitals:   09/20/21 0848 09/20/21 0900 09/20/21 0933 09/20/21 1106  BP: (!) 117/97 124/89  120/78  Pulse:  (!) 54 69 84  Resp:  20  19  Temp: (!) 97.4 F (36.3 C)   98.2 F (36.8 C)  TempSrc: Oral   Oral  SpO2:  97%  97%  Weight:      Height:        Intake/Output Summary (Last 24 hours) at 09/20/2021 1146 Last data filed at 09/20/2021 1129 Gross per 24 hour  Intake 864.34 ml  Output 1850 ml  Net -985.66 ml   Filed Weights   09/18/21 0603 09/19/21 0700 09/20/21 0300  Weight: 77.1 kg 76.1 kg 76 kg    Examination:  General exam: AAOx3, no distress Respiratory system: Decreased breath sounds to bases otherwise clear Cardiovascular system: S1 & S2 heard, RRR.  Abd: nondistended, soft and nontender.Normal bowel sounds heard. Central nervous system: Alert and oriented. No focal neurological deficits. Extremities: Trace edema, [place] boots, dressing on both legs Skin: As above Psychiatry:  Mood & affect appropriate.     Data Reviewed:   CBC: Recent Labs  Lab 09/17/21 0952 09/18/21 0147 09/19/21 0221 09/20/21 0435 09/20/21 0808 09/20/21 0811 09/20/21 0812  WBC 4.2 5.2 5.1 6.3  --   --   --   HGB 12.2* 12.4* 12.9* 13.1 13.3 13.3  13.3  HCT 39.0 39.4 40.7 41.4 39.0 39.0 39.0  MCV 85.7 83.3 83.2 83.0  --   --   --   PLT 193 183 181 193  --   --   --    Basic Metabolic Panel: Recent Labs  Lab 09/17/21 0952 09/18/21 0147 09/19/21 0221 09/20/21 0435 09/20/21 0808 09/20/21 0811 09/20/21 0812  NA 138 140 137 137 138 139  139  K 3.5 3.5 3.1* 4.1 3.8 3.8 3.9  CL 104 103 99 104  --   --   --   CO2 27 29 28 28   --   --   --   GLUCOSE 193* 128* 114* 128*  --   --   --   BUN 16 15 17 17   --   --   --   CREATININE 1.14 1.05 1.02 1.03  --   --   --   CALCIUM 8.9 9.2 8.6* 8.7*  --   --   --   MG  --   --   --  1.9  --   --   --    GFR: Estimated Creatinine Clearance: 54.3 mL/min (by C-G formula based on SCr of 1.03 mg/dL). Liver Function Tests: Recent Labs  Lab 09/18/21 0147  AST 42*  ALT 33  ALKPHOS 86  BILITOT 2.5*  PROT 6.6  ALBUMIN 3.5   No results for input(s): LIPASE, AMYLASE in the last 168 hours. No results for input(s): AMMONIA in the last 168 hours. Coagulation Profile: Recent Labs  Lab 09/17/21 1317 09/18/21 0147  INR 1.5* 1.4*   Cardiac Enzymes: No results for input(s): CKTOTAL, CKMB, CKMBINDEX, TROPONINI in the last 168 hours. BNP (last 3 results) No results for input(s): PROBNP in the last 8760 hours. HbA1C: Recent Labs    09/17/21 1723  HGBA1C 7.0*   CBG: Recent Labs  Lab 09/19/21 1137 09/19/21 1704 09/19/21 2156 09/20/21 0607 09/20/21 1124  GLUCAP 116* 148* 157* 146* 196*   Lipid Profile: No results for input(s): CHOL, HDL, LDLCALC, TRIG, CHOLHDL, LDLDIRECT in the last 72 hours. Thyroid Function Tests: No results for input(s): TSH, T4TOTAL, FREET4, T3FREE, THYROIDAB in the last 72 hours. Anemia Panel: No results for input(s): VITAMINB12, FOLATE, FERRITIN, TIBC, IRON, RETICCTPCT in the last 72 hours. Urine analysis:    Component Value Date/Time   COLORURINE STRAW (A) 03/05/2017 1120   APPEARANCEUR CLEAR 03/05/2017 1120   LABSPEC 1.005 03/05/2017 1120   PHURINE 6.0 03/05/2017 1120   GLUCOSEU NEGATIVE 03/05/2017 1120   HGBUR NEGATIVE 03/05/2017 1120   BILIRUBINUR NEGATIVE 03/05/2017 1120   KETONESUR NEGATIVE 03/05/2017 1120   PROTEINUR NEGATIVE 03/05/2017 1120   UROBILINOGEN 0.2 03/05/2015 1115   NITRITE NEGATIVE 03/05/2017 1120   LEUKOCYTESUR NEGATIVE  03/05/2017 1120   Sepsis Labs: @LABRCNTIP (procalcitonin:4,lacticidven:4)  ) Recent Results (from the past 240 hour(s))  Resp Panel by RT-PCR (Flu A&B, Covid) Nasopharyngeal Swab     Status: None   Collection Time: 09/17/21 12:54 PM   Specimen: Nasopharyngeal Swab; Nasopharyngeal(NP) swabs in vial transport medium  Result Value Ref Range Status   SARS Coronavirus 2 by RT PCR NEGATIVE NEGATIVE Final    Comment: (NOTE) SARS-CoV-2 target nucleic acids are NOT DETECTED.  The SARS-CoV-2 RNA is generally detectable in upper respiratory specimens during the acute phase of infection. The lowest concentration of SARS-CoV-2 viral copies this assay can detect is 138 copies/mL. A negative result does not preclude SARS-Cov-2 infection and should not be used as the sole basis  for treatment or other patient management decisions. A negative result may occur with  improper specimen collection/handling, submission of specimen other than nasopharyngeal swab, presence of viral mutation(s) within the areas targeted by this assay, and inadequate number of viral copies(<138 copies/mL). A negative result must be combined with clinical observations, patient history, and epidemiological information. The expected result is Negative.  Fact Sheet for Patients:  EntrepreneurPulse.com.au  Fact Sheet for Healthcare Providers:  IncredibleEmployment.be  This test is no t yet approved or cleared by the [place] FDA and  has been authorized for detection and/or diagnosis of SARS-CoV-2 by FDA under an Emergency Use Authorization (EUA). This EUA will remain  in effect (meaning this test can be used) for the duration of the COVID-19 declaration under Section 564(b)(1) of the Act, 21 U.S.C.section 360bbb-3(b)(1), unless the authorization is terminated  or revoked sooner.       Influenza A by PCR NEGATIVE NEGATIVE Final   Influenza B by PCR NEGATIVE NEGATIVE Final     Comment: (NOTE) The Xpert Xpress SARS-CoV-2/FLU/RSV plus assay is intended as an aid in the diagnosis of influenza from Nasopharyngeal swab specimens and should not be used as a sole basis for treatment. Nasal washings and aspirates are unacceptable for Xpert Xpress SARS-CoV-2/FLU/RSV testing.  Fact Sheet for Patients: EntrepreneurPulse.com.au  Fact Sheet for Healthcare Providers: IncredibleEmployment.be  This test is not yet approved or cleared by the [place] FDA and has been authorized for detection and/or diagnosis of SARS-CoV-2 by FDA under an Emergency Use Authorization (EUA). This EUA will remain in effect (meaning this test can be used) for the duration of the COVID-19 declaration under Section 564(b)(1) of the Act, 21 U.S.C. section 360bbb-3(b)(1), unless the authorization is terminated or revoked.  Performed at [facility], [place] [address], [place], [place] [address]      Radiology Studies: CARDIAC CATHETERIZATION  Result Date: 09/20/2021   Prox LAD lesion is 30% stenosed.   Dist Cx lesion is 70% stenosed.   Prox RCA-1 lesion is 45% stenosed.   Mid RCA lesion is 50% stenosed.   Previously placed Prox RCA-2 stent (unknown type) is  widely patent.   LV end diastolic pressure is mildly elevated.   Hemodynamic findings consistent with moderate pulmonary hypertension. Borderline obstructive disease in the distal LCx. Patent stent in the mid RCA. No change compared to 2016 Mildly elevated LV filling pressures. PCWP 22 mm Hg Moderate pulmonary HTN - mean PAP 39 mm Hg 4.   Preserved cardiac output - index 2.67. Plan: optimize medical therapy for CHF. OK to resume anticoagulation this pm- will defer to primary team whether to resume coumadin or switch to Eliquis.   ECHOCARDIOGRAM COMPLETE  Result Date: 09/18/2021    ECHOCARDIOGRAM REPORT   Patient Name:   Laurie CLAYSON RILING Date of Exam: 09/18/2021 Medical Rec #:  [id number]      Height:        69.0 in Accession #:    [id number]     Weight:       169.9 lb Date of Birth:  May 13, 1936       BSA:          1.928 m Patient Age:    36 years       BP:           116/77 mmHg Patient Gender: M              HR:  54 bpm. Exam Location:  Inpatient Procedure: 2D Echo, Cardiac Doppler and Color Doppler Indications:    Dyspnea  History:        Patient has prior history of Echocardiogram examinations, most                 recent 03/18/2014. CAD, Arrythmias:Atrial Fibrillation; Risk                 Factors:Diabetes and Former Smoker. S/P PCI. ETOH abuse. History                 of CVA.  Sonographer:    Clayton Lefort RDCS (AE) Referring Phys: [place]  1. Left ventricular ejection fraction, by estimation, is 25%. The left ventricle has normal function. The left ventricle demonstrates global hypokinesis. Left ventricular diastolic parameters are indeterminate.  2. Right ventricular systolic function is normal. The right ventricular size is normal. There is normal pulmonary artery systolic pressure.  3. Left atrial size was mildly dilated.  4. The mitral valve is abnormal. Mild to moderate mitral valve regurgitation. No evidence of mitral stenosis.  5. Tricuspid valve regurgitation is moderate.  6. The aortic valve is tricuspid. There is mild calcification of the aortic valve. Aortic valve regurgitation is not visualized. Aortic valve sclerosis is present, with no evidence of aortic valve stenosis.  7. Aortic dilatation noted. There is mild dilatation of the ascending aorta, measuring 38 mm.  8. The inferior vena cava is dilated in size with >50% respiratory variability, suggesting right atrial pressure of 8 mmHg. FINDINGS  Left Ventricle: Left ventricular ejection fraction, by estimation, is 25%. The left ventricle has normal function. The left ventricle demonstrates global hypokinesis. The left ventricular internal cavity size was normal in size. There is no left ventricular hypertrophy. Left  ventricular diastolic parameters are indeterminate. Right Ventricle: The right ventricular size is normal. No increase in right ventricular wall thickness. Right ventricular systolic function is normal. There is normal pulmonary artery systolic pressure. The tricuspid regurgitant velocity is 2.36 m/s, and  with an assumed right atrial pressure of 8 mmHg, the estimated right ventricular systolic pressure is 08.1 mmHg. Left Atrium: Left atrial size was mildly dilated. Right Atrium: Right atrial size was normal in size. Pericardium: There is no evidence of pericardial effusion. Mitral Valve: The mitral valve is abnormal. There is mild thickening of the mitral valve leaflet(s). Mild to moderate mitral valve regurgitation. No evidence of mitral valve stenosis. MV peak gradient, 5.8 mmHg. The mean mitral valve gradient is 1.0 mmHg. Tricuspid Valve: The tricuspid valve is normal in structure. Tricuspid valve regurgitation is moderate . No evidence of tricuspid stenosis. Aortic Valve: The aortic valve is tricuspid. There is mild calcification of the aortic valve. Aortic valve regurgitation is not visualized. Aortic valve sclerosis is present, with no evidence of aortic valve stenosis. Aortic valve mean gradient measures 3.0 mmHg. Aortic valve peak gradient measures 4.8 mmHg. Aortic valve area, by VTI measures 1.68 cm. Pulmonic Valve: The pulmonic valve was normal in structure. Pulmonic valve regurgitation is not visualized. No evidence of pulmonic stenosis. Aorta: Aortic dilatation noted. There is mild dilatation of the ascending aorta, measuring 38 mm. Venous: The inferior vena cava is dilated in size with greater than 50% respiratory variability, suggesting right atrial pressure of 8 mmHg. IAS/Shunts: No atrial level shunt detected by color flow Doppler.  LEFT VENTRICLE PLAX 2D LVIDd:         6.80 cm LVIDs:  6.10 cm LV PW:         1.30 cm LV IVS:        0.90 cm LVOT diam:     2.10 cm LV SV:         36 LV SV Index:    19 LVOT Area:     3.46 cm  RIGHT VENTRICLE            IVC RV S prime:     6.68 cm/s  IVC diam: 2.10 cm TAPSE (M-mode): 1.2 cm LEFT ATRIUM              Index        RIGHT ATRIUM           Index LA diam:        4.00 cm  2.08 cm/m   RA Area:     27.80 cm LA Vol (A2C):   100.0 ml 51.88 ml/m  RA Volume:   110.00 ml 57.06 ml/m LA Vol (A4C):   110.0 ml 57.06 ml/m LA Biplane Vol: 106.0 ml 54.99 ml/m  AORTIC VALVE AV Area (Vmax):    1.97 cm AV Area (Vmean):   1.75 cm AV Area (VTI):     1.68 cm AV Vmax:           109.00 cm/s AV Vmean:          73.000 cm/s AV VTI:            0.212 m AV Peak Grad:      4.8 mmHg AV Mean Grad:      3.0 mmHg LVOT Vmax:         62.00 cm/s LVOT Vmean:        36.900 cm/s LVOT VTI:          0.103 m LVOT/AV VTI ratio: 0.49  AORTA Ao Root diam: 3.40 cm Ao Asc diam:  3.80 cm MITRAL VALVE                  TRICUSPID VALVE MV Area VTI:  1.06 cm        TR Peak grad:   22.3 mmHg MV Peak grad: 5.8 mmHg        TR Vmax:        236.00 cm/s MV Mean grad: 1.0 mmHg MV Vmax:      1.20 m/s        SHUNTS MV Vmean:     51.3 cm/s       Systemic VTI:  0.10 m MR Peak grad:    55.1 mmHg    Systemic Diam: 2.10 cm MR Mean grad:    33.0 mmHg MR Vmax:         371.00 cm/s MR Vmean:        267.0 cm/s MR PISA:         2.26 cm MR PISA Eff ROA: 21 mm MR PISA Radius:  0.60 cm Jenkins Rouge MD Electronically signed by Jenkins Rouge MD Signature Date/Time: 09/18/2021/1:21:13 PM    Final      Scheduled Meds:  aspirin  325 mg Oral Daily   atorvastatin  40 mg Oral Daily   brimonidine  1 drop Both Eyes BID   carvedilol  3.125 mg Oral BID WC   empagliflozin  25 mg Oral Daily   insulin aspart  0-9 Units Subcutaneous TID WC   isosorbide mononitrate  15 mg Oral Daily   sodium chloride flush  3 mL Intravenous Q12H   sodium  chloride flush  3 mL Intravenous Q12H   spironolactone  25 mg Oral Daily   tamsulosin  0.4 mg Oral Daily   Continuous Infusions:  sodium chloride       LOS: 2 days    Time spent:  46min  Domenic Polite, MD [facility]   09/20/2021, 11:46 AM

## 2021-09-20 NOTE — TOC Benefit Eligibility Note (Addendum)
Patient Teacher, English as a foreign language completed.    The patient is currently admitted and upon discharge could be taking Eliquis 5 mg.  The current 30 day co-pay is, $10.35.   The patient is currently admitted and upon discharge could be taking Entresto 24-26 mg.  The current 30 day co-pay is, $10.35.   The patient is insured through [employer] [place] Medicare Part D     Lyndel Safe, [place] Patient Advocate Specialist [place] Patient Advocate Team Direct Number: [phone]  Fax: [phone]

## 2021-09-20 NOTE — Progress Notes (Signed)
Pt. Had 9 beats of VTach. No distress noted. VSS. On call for [facility] paged to make aware.

## 2021-09-20 NOTE — Progress Notes (Addendum)
Progress Note  Patient Name: Vincent [name] Date of Encounter: 09/20/2021  [facility] Cardiologist: Ena Dawley, MD   Subjective   Vincent [name] denies dyspnea currently.  He has been noticing increasing shortness of breath for the weeks prior to admission.  He is lying flat without difficulty breathing.  His sister is present in the room.  Current cath does not demonstrate acute lesion.  Elevated troponin I is felt secondary to stress of heart failure.  As an outpatient his heart failure regimen consists of Aldactone 25 mg daily, Jardiance 25 mg/day, isosorbide mononitrate 30 mg/day, irbesartan 300 mg/day, carvedilol 6.25 mg/day, furosemide 20 mg/day, and most recently during this hospital stay, hydralazine has been added.  Inpatient Medications    Scheduled Meds:  aspirin  325 mg Oral Daily   atorvastatin  40 mg Oral Daily   brimonidine  1 drop Both Eyes BID   carvedilol  3.125 mg Oral BID WC   empagliflozin  25 mg Oral Daily   insulin aspart  0-9 Units Subcutaneous TID WC   isosorbide mononitrate  15 mg Oral Daily   sodium chloride flush  3 mL Intravenous Q12H   sodium chloride flush  3 mL Intravenous Q12H   spironolactone  25 mg Oral Daily   tamsulosin  0.4 mg Oral Daily   Continuous Infusions:  sodium chloride     PRN Meds: sodium chloride, acetaminophen, alum & mag hydroxide-simeth, nitroGLYCERIN, ondansetron **OR** ondansetron (ZOFRAN) IV, sodium chloride flush, traMADol   Vital Signs    Vitals:   09/20/21 0848 09/20/21 0900 09/20/21 0933 09/20/21 1106  BP: (!) 117/97 124/89  120/78  Pulse:   69   Resp:      Temp: (!) 97.4 F (36.3 C)     TempSrc: Oral     SpO2:      Weight:      Height:        Intake/Output Summary (Last 24 hours) at 09/20/2021 1119 Last data filed at 09/20/2021 0945 Gross per 24 hour  Intake 864.34 ml  Output 1000 ml  Net -135.66 ml   Last 3 Weights 09/20/2021 09/19/2021 09/18/2021  Weight (lbs) 167 lb 9.6 oz 167 lb 11.2 oz 169  lb 14.4 oz  Weight (kg) 76.023 kg 76.068 kg 77.066 kg      Telemetry   Afib with PVC's.  A 9 beat run of VT has been noted.  ECG    Prominent voltage, atrial fibs with poor rate control.  No acute ST-T wave changes.- Personally Reviewed  Physical Exam  Comfortable GEN: No acute distress.   Neck: No JVD Cardiac: IIRR, no murmurs, rubs, or gallops.  Respiratory: Clear to auscultation bilaterally. GI: Soft, nontender, non-distended  MS: No edema; No deformity. Neuro:  Nonfocal  Psych: Normal affect   Labs    High Sensitivity Troponin:   Recent Labs  Lab 09/17/21 1206 09/17/21 1723 09/17/21 2108 09/18/21 0147 09/18/21 0502  TROPONINIHS 36* 724* 1,360* 1,624* 1,752*     Chemistry Recent Labs  Lab 09/18/21 0147 09/19/21 0221 09/20/21 0435 09/20/21 0808 09/20/21 0811 09/20/21 0812  NA 140 137 137 138 139 139  K 3.5 3.1* 4.1 3.8 3.8 3.9  CL 103 99 104  --   --   --   CO2 29 28 28   --   --   --   GLUCOSE 128* 114* 128*  --   --   --   BUN 15 17 17   --   --   --  CREATININE 1.05 1.02 1.03  --   --   --   CALCIUM 9.2 8.6* 8.7*  --   --   --   MG  --   --  1.9  --   --   --   PROT 6.6  --   --   --   --   --   ALBUMIN 3.5  --   --   --   --   --   AST 42*  --   --   --   --   --   ALT 33  --   --   --   --   --   ALKPHOS 86  --   --   --   --   --   BILITOT 2.5*  --   --   --   --   --   GFRNONAA >60 >60 >60  --   --   --   ANIONGAP 8 10 5   --   --   --     Lipids No results for input(s): CHOL, TRIG, HDL, LABVLDL, LDLCALC, CHOLHDL in the last 168 hours.  Hematology Recent Labs  Lab 09/18/21 0147 09/19/21 0221 09/20/21 0435 09/20/21 0808 09/20/21 0811 09/20/21 0812  WBC 5.2 5.1 6.3  --   --   --   RBC 4.73 4.89 4.99  --   --   --   HGB 12.4* 12.9* 13.1 13.3 13.3 13.3  HCT 39.4 40.7 41.4 39.0 39.0 39.0  MCV 83.3 83.2 83.0  --   --   --   MCH 26.2 26.4 26.3  --   --   --   MCHC 31.5 31.7 31.6  --   --   --   RDW 16.9* 16.8* 16.9*  --   --   --   PLT  183 181 193  --   --   --    Thyroid No results for input(s): TSH, FREET4 in the last 168 hours.  BNP Recent Labs  Lab 09/17/21 1317  BNP 1,017.0*    DDimer  Recent Labs  Lab 09/17/21 1317  DDIMER 0.44     Radiology    CARDIAC CATHETERIZATION  Result Date: 09/20/2021   Prox LAD lesion is 30% stenosed.   Dist Cx lesion is 70% stenosed.   Prox RCA-1 lesion is 45% stenosed.   Mid RCA lesion is 50% stenosed.   Previously placed Prox RCA-2 stent (unknown type) is  widely patent.   LV end diastolic pressure is mildly elevated.   Hemodynamic findings consistent with moderate pulmonary hypertension. Borderline obstructive disease in the distal LCx. Patent stent in the mid RCA. No change compared to 2016 Mildly elevated LV filling pressures. PCWP 22 mm Hg Moderate pulmonary HTN - mean PAP 39 mm Hg 4.   Preserved cardiac output - index 2.67. Plan: optimize medical therapy for CHF. OK to resume anticoagulation this pm- will defer to primary team whether to resume coumadin or switch to Eliquis.   ECHOCARDIOGRAM COMPLETE  Result Date: 09/18/2021    ECHOCARDIOGRAM REPORT   Patient Name:   Vincent [name] Date of Exam: 09/18/2021 Medical Rec #:  [id number]      Height:       69.0 in Accession #:    [id number]     Weight:       169.9 lb Date of Birth:  04-26-1936       BSA:  1.928 m Patient Age:    86 years       BP:           116/77 mmHg Patient Gender: M              HR:           54 bpm. Exam Location:  Inpatient Procedure: 2D Echo, Cardiac Doppler and Color Doppler Indications:    Dyspnea  History:        Patient has prior history of Echocardiogram examinations, most                 recent 03/18/2014. CAD, Arrythmias:Atrial Fibrillation; Risk                 Factors:Diabetes and Former Smoker. S/P PCI. ETOH abuse. History                 of CVA.  Sonographer:    Clayton Lefort RDCS (AE) Referring Phys: [place]  1. Left ventricular ejection fraction, by estimation, is 25%. The left  ventricle has normal function. The left ventricle demonstrates global hypokinesis. Left ventricular diastolic parameters are indeterminate.  2. Right ventricular systolic function is normal. The right ventricular size is normal. There is normal pulmonary artery systolic pressure.  3. Left atrial size was mildly dilated.  4. The mitral valve is abnormal. Mild to moderate mitral valve regurgitation. No evidence of mitral stenosis.  5. Tricuspid valve regurgitation is moderate.  6. The aortic valve is tricuspid. There is mild calcification of the aortic valve. Aortic valve regurgitation is not visualized. Aortic valve sclerosis is present, with no evidence of aortic valve stenosis.  7. Aortic dilatation noted. There is mild dilatation of the ascending aorta, measuring 38 mm.  8. The inferior vena cava is dilated in size with >50% respiratory variability, suggesting right atrial pressure of 8 mmHg. FINDINGS  Left Ventricle: Left ventricular ejection fraction, by estimation, is 25%. The left ventricle has normal function. The left ventricle demonstrates global hypokinesis. The left ventricular internal cavity size was normal in size. There is no left ventricular hypertrophy. Left ventricular diastolic parameters are indeterminate. Right Ventricle: The right ventricular size is normal. No increase in right ventricular wall thickness. Right ventricular systolic function is normal. There is normal pulmonary artery systolic pressure. The tricuspid regurgitant velocity is 2.36 m/s, and  with an assumed right atrial pressure of 8 mmHg, the estimated right ventricular systolic pressure is 62.7 mmHg. Left Atrium: Left atrial size was mildly dilated. Right Atrium: Right atrial size was normal in size. Pericardium: There is no evidence of pericardial effusion. Mitral Valve: The mitral valve is abnormal. There is mild thickening of the mitral valve leaflet(s). Mild to moderate mitral valve regurgitation. No evidence of mitral valve  stenosis. MV peak gradient, 5.8 mmHg. The mean mitral valve gradient is 1.0 mmHg. Tricuspid Valve: The tricuspid valve is normal in structure. Tricuspid valve regurgitation is moderate . No evidence of tricuspid stenosis. Aortic Valve: The aortic valve is tricuspid. There is mild calcification of the aortic valve. Aortic valve regurgitation is not visualized. Aortic valve sclerosis is present, with no evidence of aortic valve stenosis. Aortic valve mean gradient measures 3.0 mmHg. Aortic valve peak gradient measures 4.8 mmHg. Aortic valve area, by VTI measures 1.68 cm. Pulmonic Valve: The pulmonic valve was normal in structure. Pulmonic valve regurgitation is not visualized. No evidence of pulmonic stenosis. Aorta: Aortic dilatation noted. There is mild dilatation of the ascending aorta, measuring  38 mm. Venous: The inferior vena cava is dilated in size with greater than 50% respiratory variability, suggesting right atrial pressure of 8 mmHg. IAS/Shunts: No atrial level shunt detected by color flow Doppler.  LEFT VENTRICLE PLAX 2D LVIDd:         6.80 cm LVIDs:         6.10 cm LV PW:         1.30 cm LV IVS:        0.90 cm LVOT diam:     2.10 cm LV SV:         36 LV SV Index:   19 LVOT Area:     3.46 cm  RIGHT VENTRICLE            IVC RV S prime:     6.68 cm/s  IVC diam: 2.10 cm TAPSE (M-mode): 1.2 cm LEFT ATRIUM              Index        RIGHT ATRIUM           Index LA diam:        4.00 cm  2.08 cm/m   RA Area:     27.80 cm LA Vol (A2C):   100.0 ml 51.88 ml/m  RA Volume:   110.00 ml 57.06 ml/m LA Vol (A4C):   110.0 ml 57.06 ml/m LA Biplane Vol: 106.0 ml 54.99 ml/m  AORTIC VALVE AV Area (Vmax):    1.97 cm AV Area (Vmean):   1.75 cm AV Area (VTI):     1.68 cm AV Vmax:           109.00 cm/s AV Vmean:          73.000 cm/s AV VTI:            0.212 m AV Peak Grad:      4.8 mmHg AV Mean Grad:      3.0 mmHg LVOT Vmax:         62.00 cm/s LVOT Vmean:        36.900 cm/s LVOT VTI:          0.103 m LVOT/AV VTI ratio:  0.49  AORTA Ao Root diam: 3.40 cm Ao Asc diam:  3.80 cm MITRAL VALVE                  TRICUSPID VALVE MV Area VTI:  1.06 cm        TR Peak grad:   22.3 mmHg MV Peak grad: 5.8 mmHg        TR Vmax:        236.00 cm/s MV Mean grad: 1.0 mmHg MV Vmax:      1.20 m/s        SHUNTS MV Vmean:     51.3 cm/s       Systemic VTI:  0.10 m MR Peak grad:    55.1 mmHg    Systemic Diam: 2.10 cm MR Mean grad:    33.0 mmHg MR Vmax:         371.00 cm/s MR Vmean:        267.0 cm/s MR PISA:         2.26 cm MR PISA Eff ROA: 21 mm MR PISA Radius:  0.60 cm Jenkins Rouge MD Electronically signed by Jenkins Rouge MD Signature Date/Time: 09/18/2021/1:21:13 PM    Final     Cardiac Studies   LVEF 25% Diffuse coronary disease without high-grade obstruction.  No significant proximal vessel  disease.  Patient Profile     86 y.o. male  with NSTEMI, combined systolic and diastolic HF, CAD with remote stent to the RCA, last cath 07/30/15  patent mid RCA stent / 60% proximal/distal RCA stenosis / distal LCX 75% stenosis. H/O low EF 2015 EF 40-45% -->35% 2016. Recent CP with EF 25%, and CHF on CXR.  Cath 09/20/2021 unchanged.  Assessment & Plan    Acute on chronic systolic and diastolic heart failure: Progressive reduction in LVEF despite heart failure regimen that included beta-blocker, ARB, spironolactone, and Jardiance.  There is likely a medication compliance issue.  Cardiac cath today demonstrated continued mild volume overload with mean wedge of 22 mmHg.  We will plan to give additional dose of IV Lasix.  If no bump in creatinine in a.m., will switch from ARB therapy to [facility] therapy with low-dose Entresto 24/26 mg twice daily.  Decrease spironolactone to 12.5 mg/day.  Continue Jardiance.  Rate control of A. fib is poor, and therefore will likely increase carvedilol dose back to the 6.25 mg twice daily.  If rate control is still poor, will add digoxin.  Poor rate control could be aggravating systolic function. Elevated troponin:  Secondary to decompensated CHF and diffuse coronary disease. Continuous atrial fibrillation with poor rate control: Consider restoring the dose of carvedilol to 6.125 mg p.o. twice daily and adding digoxin if necessary to get better rate control.     For questions or updates, please contact [place] Please consult [email or web address] for contact info under        Signed, Sinclair Grooms, MD  09/20/2021, 11:19 AM

## 2021-09-20 NOTE — Discharge Instructions (Signed)

## 2021-09-20 NOTE — Interval H&P Note (Signed)
History and Physical Interval Note:  09/20/2021 7:33 AM  Vincent [name]  has presented today for surgery, with the diagnosis of NSTEMI.  The various methods of treatment have been discussed with the patient and family. After consideration of risks, benefits and other options for treatment, the patient has consented to  Procedure(s): RIGHT/LEFT HEART CATH AND CORONARY ANGIOGRAPHY (N/A) as a surgical intervention.  The patient's history has been reviewed, patient examined, no change in status, stable for surgery.  I have reviewed the patient's chart and labs.  Questions were answered to the patient's satisfaction.    Cath Lab Visit (complete for each Cath Lab visit)  Clinical Evaluation Leading to the Procedure:   ACS: Yes.    Non-ACS:    Anginal Classification: CCS III  Anti-ischemic medical therapy: Maximal Therapy (2 or more classes of medications)  Non-Invasive Test Results: No non-invasive testing performed  Prior CABG: No previous CABG       Collier Salina [facility] 09/20/2021 7:34 AM

## 2021-09-20 NOTE — Assessment & Plan Note (Signed)
Continue beta-blocker, electrolytes are stable

## 2021-09-21 ENCOUNTER — Ambulatory Visit (HOSPITAL_BASED_OUTPATIENT_CLINIC_OR_DEPARTMENT_OTHER): Payer: Medicare Other | Admitting: Cardiovascular Disease

## 2021-09-21 ENCOUNTER — Other Ambulatory Visit (HOSPITAL_COMMUNITY): Payer: Self-pay

## 2021-09-21 ENCOUNTER — Encounter (HOSPITAL_COMMUNITY): Payer: Self-pay | Admitting: Internal Medicine

## 2021-09-21 DIAGNOSIS — I482 Chronic atrial fibrillation, unspecified: Secondary | ICD-10-CM | POA: Diagnosis not present

## 2021-09-21 DIAGNOSIS — I214 Non-ST elevation (NSTEMI) myocardial infarction: Secondary | ICD-10-CM | POA: Diagnosis not present

## 2021-09-21 DIAGNOSIS — I5023 Acute on chronic systolic (congestive) heart failure: Secondary | ICD-10-CM | POA: Diagnosis not present

## 2021-09-21 LAB — CBC
HCT: 39.3 % (ref 39.0–52.0)
Hemoglobin: 12.6 g/dL — ABNORMAL LOW (ref 13.0–17.0)
MCH: 26.5 pg (ref 26.0–34.0)
MCHC: 32.1 g/dL (ref 30.0–36.0)
MCV: 82.6 fL (ref 80.0–100.0)
Platelets: 190 10*3/uL (ref 150–400)
RBC: 4.76 MIL/uL (ref 4.22–5.81)
RDW: 16.7 % — ABNORMAL HIGH (ref 11.5–15.5)
WBC: 5.9 10*3/uL (ref 4.0–10.5)
nRBC: 0 % (ref 0.0–0.2)

## 2021-09-21 LAB — BASIC METABOLIC PANEL
Anion gap: 9 (ref 5–15)
BUN: 15 mg/dL (ref 8–23)
CO2: 28 mmol/L (ref 22–32)
Calcium: 8.6 mg/dL — ABNORMAL LOW (ref 8.9–10.3)
Chloride: 97 mmol/L — ABNORMAL LOW (ref 98–111)
Creatinine, Ser: 1.17 mg/dL (ref 0.61–1.24)
GFR, Estimated: >60 mL/min (ref >60)
Glucose, Bld: 121 mg/dL — ABNORMAL HIGH (ref 70–99)
Potassium: 3.7 mmol/L (ref 3.5–5.1)
Sodium: 134 mmol/L — ABNORMAL LOW (ref 135–145)

## 2021-09-21 LAB — GLUCOSE, CAPILLARY
Glucose-Capillary: 141 mg/dL — ABNORMAL HIGH (ref 70–99)
Glucose-Capillary: 176 mg/dL — ABNORMAL HIGH (ref 70–99)

## 2021-09-21 MED ORDER — SODIUM CHLORIDE 0.9 % IV BOLUS
500.0000 mL | Freq: Once | INTRAVENOUS | Status: AC
  Administered 2021-09-21: 13:00:00 500 mL via INTRAVENOUS

## 2021-09-21 MED ORDER — DIGOXIN 125 MCG PO TABS
0.1250 mg | ORAL_TABLET | Freq: Every day | ORAL | Status: DC
  Administered 2021-09-21 – 2021-09-22 (×2): 0.125 mg via ORAL
  Filled 2021-09-21 (×2): qty 1

## 2021-09-21 MED ORDER — SPIRONOLACTONE 12.5 MG HALF TABLET
12.5000 mg | ORAL_TABLET | Freq: Every day | ORAL | Status: DC
  Administered 2021-09-21: 09:00:00 12.5 mg via ORAL
  Filled 2021-09-21: qty 1

## 2021-09-21 MED ORDER — SACUBITRIL-VALSARTAN 24-26 MG PO TABS
1.0000 | ORAL_TABLET | Freq: Two times a day (BID) | ORAL | Status: DC
  Administered 2021-09-21 – 2021-09-22 (×3): 1 via ORAL
  Filled 2021-09-21 (×3): qty 1

## 2021-09-21 MED FILL — Heparin Sodium (Porcine) Inj 1000 Unit/ML: INTRAMUSCULAR | Qty: 10 | Status: AC

## 2021-09-21 NOTE — TOC Initial Note (Signed)
Transition of Care [facility]) - Initial/Assessment Note    Patient Details  Name: Vincent [name] MRN: [id number] Date of Birth: October 07, 1935  Transition of Care [facility]) CM/SW Contact:    Erenest Rasher, RN Phone Number: [phone] 09/21/2021, 2:37 PM  Clinical Narrative:                  HF TOC CM spoke to pt and sister, [name] at bedside. Plan is for pt to dc home with sister to assist him after hospital stay. Offered choice for [facility]. Sister agreeable to [place]. Sister has RW for pt to use. Pt may need oxygen at dc. Will need walking oxygen sat. Attending made aware.    Sisters address   [place], [place] [address]   Expected Discharge Plan: [place] Barriers to Discharge: Continued Medical Work up   Patient Goals and CMS Choice Patient states their goals for this hospitalization and ongoing recovery are:: wants patient to remain independent CMS Medicare.gov Compare Post Acute Care list provided to:: Patient Represenative (must comment) (Sister-Marion Winfred Burn) Choice offered to / list presented to : Sibling  Expected Discharge Plan and Services Expected Discharge Plan: [place]   Discharge Planning Services: CM Consult Post Acute Care Choice: [place] arrangements for the past 2 months: Single Family Home                           HH Arranged: RN [facility] Agency: [place] Date [facility] Agency Contacted: 09/21/21 Time HH Agency Contacted: 52 Representative spoke with at [place]: Etter Sjogren  Prior Living Arrangements/Services Living arrangements for the past 2 months: [place] with:: Self Patient language and need for interpreter reviewed:: Yes Do you feel safe going back to the place where you live?: Yes      Need for Family Participation in Patient Care: Yes (Comment) Care giver support system in place?: Yes (comment)   Criminal Activity/Legal Involvement Pertinent to Current  Situation/Hospitalization: No - Comment as needed  Activities of Daily Living      Permission Sought/Granted Permission sought to share information with : Case Manager, Family Supports, PCP Permission granted to share information with : Yes, Verbal Permission Granted  Share Information with NAME: Leandra Kern  Permission granted to share info w AGENCY: Home Health, DME  Permission granted to share info w Relationship: sister  Permission granted to share info w Contact Information: [phone]  Emotional Assessment Appearance:: Appears stated age Attitude/Demeanor/Rapport:  (sleep)   Orientation: : Oriented to Self, Oriented to Place, Oriented to  Time, Oriented to Situation   Psych Involvement: No (comment)  Admission diagnosis:  CHF (congestive heart failure) ([place]) [I50.9] Elevated troponin [R77.8] Chest pain, unspecified type [R07.9] Acute on chronic congestive heart failure, unspecified heart failure type [facility]) [I50.9] Patient Active Problem List   Diagnosis Date Noted   Acute on chronic systolic CHF (congestive heart failure) ([place]) 09/19/2021   NSTEMI (non-ST elevated myocardial infarction) ([place]) 09/19/2021   Type 2 diabetes mellitus with foot ulcer ([place]) 09/19/2021   Chronic leg wounds 09/19/2021   Elevated troponin    CHF (congestive heart failure) ([place]) 09/17/2021   Chest pain 11/25/2015   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/25/2015   Hemiparesis and alteration of sensations as late effects of stroke ([place]) 09/25/2015   Abnormal stress test 07/21/2015   Foot drop, left  03/11/2015   Gait disorder 03/11/2015   Precordial pain 03/19/2014   Dizziness 03/17/2014   Near syncope 03/17/2014   Encounter for therapeutic drug monitoring 09/24/2013   Rectal bleeding 10/19/2012   External hemorrhoid, bleeding 10/19/2012   Long term current use of anticoagulant 10/12/2010   GLAUCOMA 09/17/2010   GERD 09/17/2010   HEMORRHAGE OF RECTUM AND ANUS 09/17/2010    BRADYCARDIA 06/09/2010   VENTRICULAR TACHYCARDIA 12/30/2008   SYNCOPE 12/30/2008   PULMONARY INFILTRATE INCLUDES (EOSINOPHILIA) 12/28/2007   HLD (hyperlipidemia) 12/27/2007   Essential hypertension 12/27/2007   Coronary atherosclerosis 70/78/6754   Alcoholic cardiomyopathy ([place]) 12/27/2007   PAROXYSMAL ATRIAL FIBRILLATION 12/27/2007   Cerebral artery occlusion with cerebral infarction ([place]) 12/27/2007   COUGH 12/27/2007   PCP:  [name], MD Pharmacy:   [facility] [address] [place] [address] [place] [address] Phone: [phone] Fax: [place], [place] [address] [place] [address] [place] [address] Phone: [phone] Fax: [phone]  [facility] [address] [place] [address] Phone: [phone] Fax: [phone]     Social Determinants of Health (SDOH) Interventions Food Insecurity Interventions: Intervention Not Indicated Financial Strain Interventions: Intervention Not Indicated Housing Interventions: Intervention Not Indicated Transportation Interventions: Intervention Not Indicated  Readmission Risk Interventions No flowsheet data found.

## 2021-09-21 NOTE — Progress Notes (Signed)
Heart Failure Stewardship Pharmacist Progress Note   PCP: [name], MD PCP-Cardiologist: Ena Dawley, MD    HPI:  86 yo M with PMH of CAD, CHF, stable angina, afib, T2DM, CVA, and chronic wounds. He presented to the ED on 1/20 with shortness of breath and chest pain. Admitted for CHF and NSTEMI. CXR with cardiomegaly and possible small effusions. An ECHO was done on 1/21 and LVEF was 25% (previously 40-45% in July 2015). R/LHC done on 1/23 and found to have borderline obstructive disease in the LCx, patent stent in RCA, and elevated filling pressures.   Current HF Medications: Beta Blocker: carvedilol 3.125 mg BID ACE/ARB/ARNI: Entresto 24/26 mg BID Aldosterone Antagonist: spironolactone 12.5 mg daily SGLT2i: Jardiance 25 mg daily  Prior to admission HF Medications: Diuretic: furosemide 20 mg daily Beta blocker: carvedilol 6.25 mg BID ACE/ARB/ARNI: irbesartan 300 mg daily Aldosterone Antagonist: spironolactone 25 mg daily SGLT2i: Jardiance 25 mg daily  Other: Imdur 30 mg daily  Pertinent Lab Values: Serum creatinine 1.17, BUN 15, Potassium 3.7, Sodium 134, BNP 1017, Magnesium 1.9, A1c 7.0   Vital Signs: Weight: 154 lbs (admission weight: 175 lbs) Blood pressure: 110/70s  Heart rate: 100s  I/O: -3.2L yesterday; net -9.6L  Medication Assistance / Insurance Benefits Check: Does the patient have prescription insurance?  Yes Type of insurance plan: [place] [employer]   Outpatient Pharmacy:  Prior to admission outpatient pharmacy: [facility] Is the patient willing to use [place] at discharge? Yes Is the patient willing to transition their outpatient pharmacy to utilize a [facility] outpatient pharmacy?   Pending    Assessment: 1. Acute on chronic systolic CHF (EF 09%), due to ICM. NYHA class II symptoms. - Off IV lasix - Continue carvedilol 3.125 mg BID - Agree with starting Entresto 24/26 mg BID - Agree with reducing to spironolactone 12.5 mg daily - Continue  Jardiance 25 mg daily - Off Imdur to make BP room for Entresto    Plan: 1) Medication changes recommended at this time: - Agree with changes as above   2) Patient assistance: - Entresto copay $10.35  3)  Education  - To be completed prior to discharge  Kerby Nora, PharmD, BCPS Heart Failure Cytogeneticist Phone [phone]

## 2021-09-21 NOTE — Progress Notes (Signed)
Progress Note  Patient Name: Vincent [name] Date of Encounter: 09/21/2021  [facility] Cardiologist: Ena Dawley, MD   Subjective   No problems over night.  2800 of urine out yesterday.    Inpatient Medications    Scheduled Meds:  apixaban  5 mg Oral BID   aspirin EC  81 mg Oral Daily   atorvastatin  40 mg Oral Daily   brimonidine  1 drop Both Eyes BID   carvedilol  3.125 mg Oral BID WC   empagliflozin  25 mg Oral Daily   insulin aspart  0-9 Units Subcutaneous TID WC   sacubitril-valsartan  1 tablet Oral BID   sodium chloride flush  3 mL Intravenous Q12H   sodium chloride flush  3 mL Intravenous Q12H   spironolactone  12.5 mg Oral Daily   tamsulosin  0.4 mg Oral Daily   Continuous Infusions:  sodium chloride     PRN Meds: sodium chloride, acetaminophen, alum & mag hydroxide-simeth, nitroGLYCERIN, ondansetron **OR** ondansetron (ZOFRAN) IV, sodium chloride flush, traMADol   Vital Signs    Vitals:   09/20/21 0933 09/20/21 1106 09/20/21 2305 09/21/21 0400  BP:  120/78 121/75 (!) 108/58  Pulse: 69 84 89 77  Resp:  19 15 16   Temp:  98.2 F (36.8 C) 98.6 F (37 C) 98.6 F (37 C)  TempSrc:  Oral Oral Oral  SpO2:  97% 100% 100%  Weight:    69.9 kg  Height:        Intake/Output Summary (Last 24 hours) at 09/21/2021 0807 Last data filed at 09/21/2021 0528 Gross per 24 hour  Intake 480 ml  Output 3350 ml  Net -2870 ml   Last 3 Weights 09/21/2021 09/20/2021 09/19/2021  Weight (lbs) 154 lb 1.6 oz 167 lb 9.6 oz 167 lb 11.2 oz  Weight (kg) 69.899 kg 76.023 kg 76.068 kg      Telemetry   Afib with PVC's.  A 9 beat run of VT has been noted.  ECG    Prominent voltage, atrial fibs with poor rate control.  No acute ST-T wave changes.- Personally Reviewed  Physical Exam  Comfortable GEN: No acute distress.   Neck: No JVD Cardiac: IIRR, no murmurs, rubs, or gallops.  Respiratory: Clear to auscultation bilaterally. GI: Soft, nontender, non-distended  MS:  No edema; No deformity. Neuro:  Nonfocal  Psych: Normal affect   Labs    High Sensitivity Troponin:   Recent Labs  Lab 09/17/21 1206 09/17/21 1723 09/17/21 2108 09/18/21 0147 09/18/21 0502  TROPONINIHS 36* 724* 1,360* 1,624* 1,752*     Chemistry Recent Labs  Lab 09/18/21 0147 09/19/21 0221 09/20/21 0435 09/20/21 0808 09/20/21 0811 09/20/21 0812 09/21/21 0405  NA 140 137 137   < > 139 139 134*  K 3.5 3.1* 4.1   < > 3.8 3.9 3.7  CL 103 99 104  --   --   --  97*  CO2 29 28 28   --   --   --  28  GLUCOSE 128* 114* 128*  --   --   --  121*  BUN 15 17 17   --   --   --  15  CREATININE 1.05 1.02 1.03  --   --   --  1.17  CALCIUM 9.2 8.6* 8.7*  --   --   --  8.6*  MG  --   --  1.9  --   --   --   --   PROT  6.6  --   --   --   --   --   --   ALBUMIN 3.5  --   --   --   --   --   --   AST 42*  --   --   --   --   --   --   ALT 33  --   --   --   --   --   --   ALKPHOS 86  --   --   --   --   --   --   BILITOT 2.5*  --   --   --   --   --   --   GFRNONAA >60 >60 >60  --   --   --  >60  ANIONGAP 8 10 5   --   --   --  9   < > = values in this interval not displayed.    Lipids No results for input(s): CHOL, TRIG, HDL, LABVLDL, LDLCALC, CHOLHDL in the last 168 hours.  Hematology Recent Labs  Lab 09/19/21 0221 09/20/21 0435 09/20/21 0808 09/20/21 0811 09/20/21 0812 09/21/21 0405  WBC 5.1 6.3  --   --   --  5.9  RBC 4.89 4.99  --   --   --  4.76  HGB 12.9* 13.1   < > 13.3 13.3 12.6*  HCT 40.7 41.4   < > 39.0 39.0 39.3  MCV 83.2 83.0  --   --   --  82.6  MCH 26.4 26.3  --   --   --  26.5  MCHC 31.7 31.6  --   --   --  32.1  RDW 16.8* 16.9*  --   --   --  16.7*  PLT 181 193  --   --   --  190   < > = values in this interval not displayed.   Thyroid No results for input(s): TSH, FREET4 in the last 168 hours.  BNP Recent Labs  Lab 09/17/21 1317  BNP 1,017.0*    DDimer  Recent Labs  Lab 09/17/21 1317  DDIMER 0.44     Radiology    CARDIAC  CATHETERIZATION  Result Date: 09/20/2021   Prox LAD lesion is 30% stenosed.   Dist Cx lesion is 70% stenosed.   Prox RCA-1 lesion is 45% stenosed.   Mid RCA lesion is 50% stenosed.   Previously placed Prox RCA-2 stent (unknown type) is  widely patent.   LV end diastolic pressure is mildly elevated.   Hemodynamic findings consistent with moderate pulmonary hypertension. Borderline obstructive disease in the distal LCx. Patent stent in the mid RCA. No change compared to 2016 Mildly elevated LV filling pressures. PCWP 22 mm Hg Moderate pulmonary HTN - mean PAP 39 mm Hg 4.   Preserved cardiac output - index 2.67. Plan: optimize medical therapy for CHF. OK to resume anticoagulation this pm- will defer to primary team whether to resume coumadin or switch to Eliquis.    Cardiac Studies   LVEF 25% Diffuse coronary disease without high-grade obstruction.  No significant proximal vessel disease.  Patient Profile     86 y.o. male  with NSTEMI, combined systolic and diastolic HF, CAD with remote stent to the RCA, last cath 07/30/15  patent mid RCA stent / 60% proximal/distal RCA stenosis / distal LCX 75% stenosis. H/O low EF 2015 EF 40-45% -->35% 2016. Recent CP with EF 25%, and CHF on  CXR.  Cath 09/20/2021 unchanged.  Assessment & Plan    Acute on chronic systolic and diastolic heart failure: Add Entresto and decrease spironolactone to 12.5 mg daily. Elevated troponin: DC Imdur to make BP room for Entresto. Continuous atrial fibrillation with poor rate control: Start digoxin .125 mg PO daily.    Needs 1 more day to check BMET on Entresto and insure BP stable with adjustment.  For questions or updates, please contact [place] Please consult [email or web address] for contact info under        Signed, Sinclair Grooms, MD  09/21/2021, 8:07 AM

## 2021-09-21 NOTE — Plan of Care (Signed)
°  Problem: Cardiac: Goal: Ability to achieve and maintain adequate cardiopulmonary perfusion will improve Outcome: Progressing   Problem: Clinical Measurements: Goal: Respiratory complications will improve Outcome: Progressing   Problem: Activity: Goal: Risk for activity intolerance will decrease Outcome: Progressing   Problem: Coping: Goal: Level of anxiety will decrease Outcome: Progressing

## 2021-09-21 NOTE — Progress Notes (Signed)
Mobility Specialist Progress Note:   09/21/21 1050  Mobility  Bed Position Chair  Activity Ambulated with assistance in hallway  Level of Assistance Contact guard assist, steadying assist  Assistive Device Front wheel walker  Distance Ambulated (ft) 120 ft  Activity Response Tolerated well  $Mobility charge 1 Mobility   Pt received in bed willing to participate in mobility. No complaints of pain and asymptomatic. Pt left in chair with call bell in reach and all needs met.   [facility] Public librarian Phone [phone] Secondary Phone [phone]

## 2021-09-21 NOTE — Progress Notes (Signed)
Called to the room find patient look weak, patient stated he just want to sleep, pt. Was sitting in the chair at the time, put pt. Back in bed V/S checked BP 66/43 but alert and oriented x4 denies pain.500 ml NS bolus given per order. Dr. [name] and DR. [name] notified. Patient is doing  fine,VSS. Will continue to monitor.

## 2021-09-21 NOTE — Progress Notes (Signed)
SBP < 80 mm Hg with fatigue after AM meds (Carvedilol; Aldactone; and entresto) Plan DC Aldactone and resume Entresto and Beta blocker. Lasix DC as and Tamsulosin.

## 2021-09-21 NOTE — Plan of Care (Signed)
°  Problem: Cardiac: Goal: Ability to achieve and maintain adequate cardiopulmonary perfusion will improve Outcome: Progressing   Problem: Clinical Measurements: Goal: Diagnostic test results will improve Outcome: Progressing

## 2021-09-21 NOTE — Progress Notes (Signed)
, PROGRESS NOTE    CEVIN RUBINSTEIN  XBJ:[id number] DOB: 09-Mar-1936 DOA: 09/17/2021 PCP: [name], MD  Brief Narrative:85/M with history of CAD, remote PCI and stent in 3086, chronic systolic CHF EF of 57%, chronic stable angina, alcoholic cardiomyopathy, paroxysmal A. fib on Coumadin, type 2 diabetes mellitus, history of CVA, chronic left foot drop and chronic bilateral leg wounds presented to the ED 1/20 with chest pain and shortness of breath. -EKG noted LVH with repolarization abnormalities, troponin was normal initially, trended up to 1700 today, chest x-ray noted cardiomegaly and small pleural effusions. -Troponin trended up to 1700, echo with drop in EF down to 25% -Cardiology consulting, started on IV heparin, underwent left heart cath yesterday which showed stable nonobstructive disease patent stent -Overall improving with diuresis -Titrating GDMT   Subjective: -Feels better overall, denies any chest pain, no shortness of breath today  Assessment & Plan: * Acute on chronic systolic CHF (congestive heart failure) ([place])- (present on admission) Ischemic cardiomyopathy Alcoholic CM -Previously EF around 40-45%, echo with EF down to 25% now -Diuresed well on IV Lasix he is 7 L negative -continue Aldactone and Coreg -Starting Entresto today -BMP in a.m., continue activity as tolerated, discharge planning  NSTEMI (non-ST elevated myocardial infarction) ([place])- (present on admission) CAD -remote PCI/stent, 2004 -Cardiac cath 12/16 noted patent RCA stent, distal circumflex with 75% stenosis, medical management recommended then, also has 60% mid RCA and proximal RCA lesions -continue aspirin, statin, Coreg, imdur -Coumadin held, treated with IV heparin, 2D echo with EF down to 25%,  -cardiology consulting, cath yesterday with stable disease, continue current meds   PAROXYSMAL ATRIAL FIBRILLATION- (present on admission) Heart rate controlled, continue carvedilol -Coumadin  discontinued, was on IV heparin till yesterday -Started Eliquis last night  Chronic leg wounds- (present on admission) Followed at the wound center, [facility] following, dressing changed 10-06-2022, usually changed weekly  Type 2 diabetes mellitus with foot ulcer ([place])- (present on admission) CBG stable, metformin on hold, sliding scale insulin  Essential hypertension- (present on admission) Stable, see discussion above  NSVT (nonsustained ventricular tachycardia)-resolved as of 09/20/2021 Continue beta-blocker, electrolytes are stable    DVT prophylaxis: IV heparin Code Status: Full code Family Communication: Discussed with patient in detail, no family at bedside Disposition Plan:  Inpatient  Consultants:  Cardiology  Cath   Prox LAD lesion is 30% stenosed.   Dist Cx lesion is 70% stenosed.   Prox RCA-1 lesion is 45% stenosed.   Mid RCA lesion is 50% stenosed.   Previously placed Prox RCA-2 stent (unknown type) is  widely patent.   LV end diastolic pressure is mildly elevated.   Hemodynamic findings consistent with moderate pulmonary hypertension.   Borderline obstructive disease in the distal LCx. Patent stent in the mid RCA. No change compared to 2016 Mildly elevated LV filling pressures. PCWP 22 mm Hg Moderate pulmonary HTN - mean PAP 39 mm Hg 4.   Preserved cardiac output - index 2.67.      Plan: optimize medical therapy for CHF. OK to resume anticoagulation this pm- will defer to primary team whether to resume coumadin or switch to Eliquis.     Antimicrobials:    Objective: Vitals:   09/20/21 1106 09/20/21 2305 09/21/21 0400 09/21/21 0904  BP: 120/78 121/75 (!) 108/58 107/71  Pulse: 84 89 77   Resp: 19 15 16    Temp: 98.2 F (36.8 C) 98.6 F (37 C) 98.6 F (37 C) 98.5 F (36.9 C)  TempSrc: Oral Oral Oral  Oral  SpO2: 97% 100% 100%   Weight:   69.9 kg   Height:        Intake/Output Summary (Last 24 hours) at 09/21/2021 1229 Last data filed at 09/21/2021 1017 Gross  per 24 hour  Intake 240 ml  Output 2500 ml  Net -2260 ml   Filed Weights   09/19/21 0700 09/20/21 0300 09/21/21 0400  Weight: 76.1 kg 76 kg 69.9 kg    Examination:  Gen: Awake, Alert, Oriented X 3,  HEENT: no JVD Lungs: Good air movement bilaterally, CTAB CVS: S1S2/RRR Abd: soft, Non tender, non distended, BS present Extremities:Trace edema, Unna boots, dressing on both legs Skin: As above Psychiatry:  Mood & affect appropriate.     Data Reviewed:   CBC: Recent Labs  Lab 09/17/21 0952 09/18/21 0147 09/19/21 0221 09/20/21 0435 09/20/21 5102 09/20/21 0811 09/20/21 0812 09/21/21 0405  WBC 4.2 5.2 5.1 6.3  --   --   --  5.9  HGB 12.2* 12.4* 12.9* 13.1 13.3 13.3 13.3 12.6*  HCT 39.0 39.4 40.7 41.4 39.0 39.0 39.0 39.3  MCV 85.7 83.3 83.2 83.0  --   --   --  82.6  PLT 193 183 181 193  --   --   --  585   Basic Metabolic Panel: Recent Labs  Lab 09/17/21 0952 09/18/21 0147 09/19/21 0221 09/20/21 0435 09/20/21 0808 09/20/21 0811 09/20/21 0812 09/21/21 0405  NA 138 140 137 137 138 139 139 134*  K 3.5 3.5 3.1* 4.1 3.8 3.8 3.9 3.7  CL 104 103 99 104  --   --   --  97*  CO2 27 29 28 28   --   --   --  28  GLUCOSE 193* 128* 114* 128*  --   --   --  121*  BUN 16 15 17 17   --   --   --  15  CREATININE 1.14 1.05 1.02 1.03  --   --   --  1.17  CALCIUM 8.9 9.2 8.6* 8.7*  --   --   --  8.6*  MG  --   --   --  1.9  --   --   --   --    GFR: Estimated Creatinine Clearance: 47.3 mL/min (by C-G formula based on SCr of 1.17 mg/dL). Liver Function Tests: Recent Labs  Lab 09/18/21 0147  AST 42*  ALT 33  ALKPHOS 86  BILITOT 2.5*  PROT 6.6  ALBUMIN 3.5   No results for input(s): LIPASE, AMYLASE in the last 168 hours. No results for input(s): AMMONIA in the last 168 hours. Coagulation Profile: Recent Labs  Lab 09/17/21 1317 09/18/21 0147  INR 1.5* 1.4*   Cardiac Enzymes: No results for input(s): CKTOTAL, CKMB, CKMBINDEX, TROPONINI in the last 168 hours. BNP  (last 3 results) No results for input(s): PROBNP in the last 8760 hours. HbA1C: No results for input(s): HGBA1C in the last 72 hours.  CBG: Recent Labs  Lab 09/20/21 0607 09/20/21 1124 09/20/21 1626 09/21/21 0756 09/21/21 1136  GLUCAP 146* 196* 144* 141* 176*   Lipid Profile: No results for input(s): CHOL, HDL, LDLCALC, TRIG, CHOLHDL, LDLDIRECT in the last 72 hours. Thyroid Function Tests: No results for input(s): TSH, T4TOTAL, FREET4, T3FREE, THYROIDAB in the last 72 hours. Anemia Panel: No results for input(s): VITAMINB12, FOLATE, FERRITIN, TIBC, IRON, RETICCTPCT in the last 72 hours. Urine analysis:    Component Value Date/Time   COLORURINE STRAW (A) 03/05/2017 1120  APPEARANCEUR CLEAR 03/05/2017 1120   LABSPEC 1.005 03/05/2017 1120   PHURINE 6.0 03/05/2017 1120   GLUCOSEU NEGATIVE 03/05/2017 1120   HGBUR NEGATIVE 03/05/2017 1120   BILIRUBINUR NEGATIVE 03/05/2017 1120   KETONESUR NEGATIVE 03/05/2017 1120   PROTEINUR NEGATIVE 03/05/2017 1120   UROBILINOGEN 0.2 03/05/2015 1115   NITRITE NEGATIVE 03/05/2017 1120   LEUKOCYTESUR NEGATIVE 03/05/2017 1120   Sepsis Labs: @LABRCNTIP (procalcitonin:4,lacticidven:4)  ) Recent Results (from the past 240 hour(s))  Resp Panel by RT-PCR (Flu A&B, Covid) Nasopharyngeal Swab     Status: None   Collection Time: 09/17/21 12:54 PM   Specimen: Nasopharyngeal Swab; Nasopharyngeal(NP) swabs in vial transport medium  Result Value Ref Range Status   SARS Coronavirus 2 by RT PCR NEGATIVE NEGATIVE Final    Comment: (NOTE) SARS-CoV-2 target nucleic acids are NOT DETECTED.  The SARS-CoV-2 RNA is generally detectable in upper respiratory specimens during the acute phase of infection. The lowest concentration of SARS-CoV-2 viral copies this assay can detect is 138 copies/mL. A negative result does not preclude SARS-Cov-2 infection and should not be used as the sole basis for treatment or other patient management decisions. A negative  result may occur with  improper specimen collection/handling, submission of specimen other than nasopharyngeal swab, presence of viral mutation(s) within the areas targeted by this assay, and inadequate number of viral copies(<138 copies/mL). A negative result must be combined with clinical observations, patient history, and epidemiological information. The expected result is Negative.  Fact Sheet for Patients:  EntrepreneurPulse.com.au  Fact Sheet for Healthcare Providers:  IncredibleEmployment.be  This test is no t yet approved or cleared by the [place] FDA and  has been authorized for detection and/or diagnosis of SARS-CoV-2 by FDA under an Emergency Use Authorization (EUA). This EUA will remain  in effect (meaning this test can be used) for the duration of the COVID-19 declaration under Section 564(b)(1) of the Act, 21 U.S.C.section 360bbb-3(b)(1), unless the authorization is terminated  or revoked sooner.       Influenza A by PCR NEGATIVE NEGATIVE Final   Influenza B by PCR NEGATIVE NEGATIVE Final    Comment: (NOTE) The Xpert Xpress SARS-CoV-2/FLU/RSV plus assay is intended as an aid in the diagnosis of influenza from Nasopharyngeal swab specimens and should not be used as a sole basis for treatment. Nasal washings and aspirates are unacceptable for Xpert Xpress SARS-CoV-2/FLU/RSV testing.  Fact Sheet for Patients: EntrepreneurPulse.com.au  Fact Sheet for Healthcare Providers: IncredibleEmployment.be  This test is not yet approved or cleared by the [place] FDA and has been authorized for detection and/or diagnosis of SARS-CoV-2 by FDA under an Emergency Use Authorization (EUA). This EUA will remain in effect (meaning this test can be used) for the duration of the COVID-19 declaration under Section 564(b)(1) of the Act, 21 U.S.C. section 360bbb-3(b)(1), unless the authorization is  terminated or revoked.  Performed at [facility], [place] [address], [place], [place] [address]      Radiology Studies: CARDIAC CATHETERIZATION  Result Date: 09/20/2021   Prox LAD lesion is 30% stenosed.   Dist Cx lesion is 70% stenosed.   Prox RCA-1 lesion is 45% stenosed.   Mid RCA lesion is 50% stenosed.   Previously placed Prox RCA-2 stent (unknown type) is  widely patent.   LV end diastolic pressure is mildly elevated.   Hemodynamic findings consistent with moderate pulmonary hypertension. Borderline obstructive disease in the distal LCx. Patent stent in the mid RCA. No change compared to 2016 Mildly elevated LV  filling pressures. PCWP 22 mm Hg Moderate pulmonary HTN - mean PAP 39 mm Hg 4.   Preserved cardiac output - index 2.67. Plan: optimize medical therapy for CHF. OK to resume anticoagulation this pm- will defer to primary team whether to resume coumadin or switch to Eliquis.     Scheduled Meds:  apixaban  5 mg Oral BID   aspirin EC  81 mg Oral Daily   atorvastatin  40 mg Oral Daily   brimonidine  1 drop Both Eyes BID   carvedilol  3.125 mg Oral BID WC   empagliflozin  25 mg Oral Daily   insulin aspart  0-9 Units Subcutaneous TID WC   sacubitril-valsartan  1 tablet Oral BID   sodium chloride flush  3 mL Intravenous Q12H   sodium chloride flush  3 mL Intravenous Q12H   spironolactone  12.5 mg Oral Daily   tamsulosin  0.4 mg Oral Daily   Continuous Infusions:  sodium chloride       LOS: 3 days    Time spent: 66min  Domenic Polite, MD [facility]   09/21/2021, 12:29 PM

## 2021-09-21 NOTE — Assessment & Plan Note (Addendum)
His blood pressure remained stable, continue diuretic therapy, entresto and carvedilol.

## 2021-09-21 NOTE — Progress Notes (Signed)
Heart Failure Nurse Navigator Progress Note  PCP: [name], MD PCP-Cardiologist: Doreatha Lew., MD Admission Diagnosis: CHF Admitted from: home alone  Presentation:   Vincent [name] presented 1/21 with increased SOB and edema and BLE wounds. Pt resting in bed on room air, HOB 45. Patient interactive with interview process.  Pt states he lives home alone, but sister lives 1 mile down the road. Sister provides transportation. Vincent [name], sister, at bedside.  Pt states he has not walked much yet, note pt walked with mobility technician 170ft with walker. Quit smoking 40 years ago, 1/2 PPD x 15 years. Pt states he often eats frozen TV dinners. Educated on sodium modification and fluid restrictions.  Explained benefits of Heart & Vascular Transitions of Care Clinic appointment, patient agreeable.     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 s/p remote stent to Cx 2004 with chronic stable angina in context of residual circumflex disease.   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/25/2015   NSVT (nonsustained ventricular tachycardia)    Permanent atrial fibrillation (HCC)    Pulmonary eosinophilia (HCC)    Sinus bradycardia    Stroke (HCC)    Syncope and collapse    Unspecified glaucoma(365.9)      Social History   Socioeconomic History   Marital status: Divorced    Spouse name: Not on file   Number of  children: 0   Years of education: master's   Highest education level: Master's degree (e.g., MA, MS, MEng, MEd, MSW, MBA)  Occupational History   Occupation: semi-retired  Tobacco Use   Smoking status: Former    Packs/day: 0.50    Years: 15.00    Pack years: 7.50    Types: Cigarettes    Quit date: 08/22/1983    Years since quitting: 38.1   Smokeless tobacco: Never  Vaping Use   Vaping Use: Never used  Substance and Sexual Activity   Alcohol use: No   Drug use: No   Sexual activity: Not on file  Other Topics Concern   Not on file  Social History Narrative   Patient drinks 1-2 cups of caffeine daily.   Patient is right handed.   Social Determinants of Health   Financial Resource Strain: Low Risk    Difficulty of Paying Living Expenses: Not very hard  Food Insecurity: No Food Insecurity   Worried About Charity fundraiser in the Last Year: Never true   Ran Out of Food in the Last Year: Never true  Transportation Needs: No Transportation Needs   Lack of Transportation (Medical): No   Lack of Transportation (Non-Medical): No  Physical Activity: Not on file  Stress: Not on file  Social Connections: Not on file    High Risk Criteria for Readmission and/or Poor Patient Outcomes: Heart failure hospital admissions (last 6 months): 1  No Show rate: 9% Difficult social situation: no Demonstrates medication adherence: no Primary Language: English Literacy level: able to read/write and comprehend.   Education Assessment and Provision:  Detailed education and instructions provided on heart failure disease management including the following:  Signs and symptoms of Heart Failure When to call the physician Importance of daily weights Low sodium diet Fluid restriction Medication management Anticipated future follow-up appointments  Patient education given on each of the above topics.  Patient acknowledges understanding via teach back method and acceptance of all  instructions.  Education Materials:  "Living Better With Heart Failure" Booklet, HF zone tool, & Daily Weight Tracker Tool.  Patient has scale at home: yes Patient has pill box at home: yes   Barriers of Care:   -transportation: sister drives -medication compliance -dietary indiscretion  Considerations/Referrals:   Referral made to Heart Failure Pharmacist Stewardship: yes, appreciated Referral made to Heart Failure CSW/NCM TOC: yes, appreciated Referral made to Heart & Vascular TOC clinic: yes, 2/1 @ 11AM  Items for Follow-up on DC/TOC: -optimize -cont HF education -dietary modification   Pricilla Holm, MSN, RN Heart Failure Nurse Navigator [phone]

## 2021-09-22 ENCOUNTER — Other Ambulatory Visit (HOSPITAL_COMMUNITY): Payer: Self-pay

## 2021-09-22 ENCOUNTER — Encounter (HOSPITAL_BASED_OUTPATIENT_CLINIC_OR_DEPARTMENT_OTHER): Payer: Medicare Other | Admitting: Internal Medicine

## 2021-09-22 DIAGNOSIS — I482 Chronic atrial fibrillation, unspecified: Secondary | ICD-10-CM | POA: Diagnosis not present

## 2021-09-22 DIAGNOSIS — I6350 Cerebral infarction due to unspecified occlusion or stenosis of unspecified cerebral artery: Secondary | ICD-10-CM

## 2021-09-22 DIAGNOSIS — E871 Hypo-osmolality and hyponatremia: Secondary | ICD-10-CM

## 2021-09-22 DIAGNOSIS — I5023 Acute on chronic systolic (congestive) heart failure: Secondary | ICD-10-CM | POA: Diagnosis not present

## 2021-09-22 LAB — LIPID PANEL
Cholesterol: 145 mg/dL (ref 0–200)
HDL: 59 mg/dL (ref >40)
LDL Cholesterol: 77 mg/dL (ref 0–99)
Total CHOL/HDL Ratio: 2.5 RATIO
Triglycerides: 46 mg/dL (ref <150)
VLDL: 9 mg/dL (ref 0–40)

## 2021-09-22 LAB — BASIC METABOLIC PANEL
Anion gap: 12 (ref 5–15)
BUN: 19 mg/dL (ref 8–23)
CO2: 24 mmol/L (ref 22–32)
Calcium: 8.6 mg/dL — ABNORMAL LOW (ref 8.9–10.3)
Chloride: 96 mmol/L — ABNORMAL LOW (ref 98–111)
Creatinine, Ser: 1.06 mg/dL (ref 0.61–1.24)
GFR, Estimated: >60 mL/min (ref >60)
Glucose, Bld: 132 mg/dL — ABNORMAL HIGH (ref 70–99)
Potassium: 3.6 mmol/L (ref 3.5–5.1)
Sodium: 132 mmol/L — ABNORMAL LOW (ref 135–145)

## 2021-09-22 LAB — GLUCOSE, CAPILLARY
Glucose-Capillary: 124 mg/dL — ABNORMAL HIGH (ref 70–99)
Glucose-Capillary: 134 mg/dL — ABNORMAL HIGH (ref 70–99)

## 2021-09-22 MED ORDER — ATORVASTATIN CALCIUM 80 MG PO TABS
80.0000 mg | ORAL_TABLET | Freq: Every day | ORAL | 0 refills | Status: DC
  Filled 2021-09-22: qty 30, 30d supply, fill #0

## 2021-09-22 MED ORDER — ATORVASTATIN CALCIUM 80 MG PO TABS: 80.0000 mg | ORAL_TABLET | Freq: Every day | ORAL | Status: DC

## 2021-09-22 MED ORDER — SACUBITRIL-VALSARTAN 24-26 MG PO TABS
1.0000 | ORAL_TABLET | Freq: Two times a day (BID) | ORAL | 0 refills | Status: DC
  Filled 2021-09-22: qty 60, 30d supply, fill #0

## 2021-09-22 MED ORDER — POTASSIUM CHLORIDE CRYS ER 20 MEQ PO TBCR
40.0000 meq | EXTENDED_RELEASE_TABLET | Freq: Once | ORAL | Status: AC
  Administered 2021-09-22: 11:00:00 40 meq via ORAL
  Filled 2021-09-22: qty 2

## 2021-09-22 MED ORDER — CARVEDILOL 3.125 MG PO TABS
3.1250 mg | ORAL_TABLET | Freq: Two times a day (BID) | ORAL | 0 refills | Status: DC
  Filled 2021-09-22: qty 60, 30d supply, fill #0

## 2021-09-22 MED ORDER — FUROSEMIDE 20 MG PO TABS
40.0000 mg | ORAL_TABLET | Freq: Every day | ORAL | 0 refills | Status: DC
  Filled 2021-09-22: qty 60, 30d supply, fill #0

## 2021-09-22 MED ORDER — DIGOXIN 125 MCG PO TABS
0.1250 mg | ORAL_TABLET | Freq: Every day | ORAL | 0 refills | Status: DC
  Filled 2021-09-22: qty 30, 30d supply, fill #0

## 2021-09-22 MED ORDER — APIXABAN 5 MG PO TABS
5.0000 mg | ORAL_TABLET | Freq: Two times a day (BID) | ORAL | 0 refills | Status: DC
  Filled 2021-09-22: qty 60, 30d supply, fill #0

## 2021-09-22 NOTE — Care Management Important Message (Signed)
Important Message  Patient Details  Name: Vincent [name] MRN: [id number] Date of Birth: 1935-10-05   Medicare Important Message Given:  Yes     Shelda Altes 09/22/2021, 9:36 AM

## 2021-09-22 NOTE — Progress Notes (Signed)
Progress Note  Patient Name: Vincent [name] Date of Encounter: 09/22/2021  [facility] Cardiologist: Ena Dawley, MD   Subjective   Feels better.  Wants to go home.  He will go home with his sister.  I stressed the importance of having a scale at home and weighing every day.  Notifying us if there is an increase of greater than 3 pounds overnight or 5 pounds in 1 week.    Inpatient Medications    Scheduled Meds:  apixaban  5 mg Oral BID   aspirin EC  81 mg Oral Daily   [START ON 09/23/2021] atorvastatin  80 mg Oral Daily   brimonidine  1 drop Both Eyes BID   carvedilol  3.125 mg Oral BID WC   digoxin  0.125 mg Oral Daily   empagliflozin  25 mg Oral Daily   insulin aspart  0-9 Units Subcutaneous TID WC   sacubitril-valsartan  1 tablet Oral BID   sodium chloride flush  3 mL Intravenous Q12H   sodium chloride flush  3 mL Intravenous Q12H   Continuous Infusions:  sodium chloride     PRN Meds: sodium chloride, acetaminophen, alum & mag hydroxide-simeth, nitroGLYCERIN, ondansetron **OR** ondansetron (ZOFRAN) IV, sodium chloride flush, traMADol   Vital Signs    Vitals:   09/22/21 0518 09/22/21 0750 09/22/21 1202 09/22/21 1203  BP: 105/74 105/77 97/72 97/72   Pulse: 89 97 87 99  Resp: 20 19 20 20   Temp: 98.3 F (36.8 C) 98.3 F (36.8 C) 98.5 F (36.9 C) 98.5 F (36.9 C)  TempSrc: Oral Oral Oral   SpO2: 96% 92% 97%   Weight: 72.6 kg     Height:        Intake/Output Summary (Last 24 hours) at 09/22/2021 1227 Last data filed at 09/22/2021 0626 Gross per 24 hour  Intake 1563 ml  Output 1550 ml  Net 13 ml   Last 3 Weights 09/22/2021 09/21/2021 09/20/2021  Weight (lbs) 160 lb 1.6 oz 154 lb 1.6 oz 167 lb 9.6 oz  Weight (kg) 72.621 kg 69.899 kg 76.023 kg      Telemetry   Afib with PVC's.  A 9 beat run of VT has been noted.  ECG    Prominent voltage, atrial fibs with poor rate control.  No acute ST-T wave changes.- Personally Reviewed  Physical Exam   Comfortable GEN: No acute distress.   Neck: No JVD Cardiac: IIRR, no murmurs, rubs, or gallops.  Respiratory: Clear to auscultation bilaterally. GI: Soft, nontender, non-distended  MS: No edema; No deformity. Neuro:  Nonfocal  Psych: Normal affect   Labs    High Sensitivity Troponin:   Recent Labs  Lab 09/17/21 1206 09/17/21 1723 09/17/21 2108 09/18/21 0147 09/18/21 0502  TROPONINIHS 36* 724* 1,360* 1,624* 1,752*     Chemistry Recent Labs  Lab 09/18/21 0147 09/19/21 0221 09/20/21 0435 09/20/21 0808 09/20/21 0812 09/21/21 0405 09/22/21 0330  NA 140   < > 137   < > 139 134* 132*  K 3.5   < > 4.1   < > 3.9 3.7 3.6  CL 103   < > 104  --   --  97* 96*  CO2 29   < > 28  --   --  28 24  GLUCOSE 128*   < > 128*  --   --  121* 132*  BUN 15   < > 17  --   --  15 19  CREATININE 1.05   < >  1.03  --   --  1.17 1.06  CALCIUM 9.2   < > 8.7*  --   --  8.6* 8.6*  MG  --   --  1.9  --   --   --   --   PROT 6.6  --   --   --   --   --   --   ALBUMIN 3.5  --   --   --   --   --   --   AST 42*  --   --   --   --   --   --   ALT 33  --   --   --   --   --   --   ALKPHOS 86  --   --   --   --   --   --   BILITOT 2.5*  --   --   --   --   --   --   GFRNONAA >60   < > >60  --   --  >60 >60  ANIONGAP 8   < > 5  --   --  9 12   < > = values in this interval not displayed.    Lipids  Recent Labs  Lab 09/22/21 0330  CHOL 145  TRIG 46  HDL 59  LDLCALC 77  CHOLHDL 2.5    Hematology Recent Labs  Lab 09/19/21 0221 09/20/21 0435 09/20/21 0808 09/20/21 0811 09/20/21 0812 09/21/21 0405  WBC 5.1 6.3  --   --   --  5.9  RBC 4.89 4.99  --   --   --  4.76  HGB 12.9* 13.1   < > 13.3 13.3 12.6*  HCT 40.7 41.4   < > 39.0 39.0 39.3  MCV 83.2 83.0  --   --   --  82.6  MCH 26.4 26.3  --   --   --  26.5  MCHC 31.7 31.6  --   --   --  32.1  RDW 16.8* 16.9*  --   --   --  16.7*  PLT 181 193  --   --   --  190   < > = values in this interval not displayed.   Thyroid No results for  input(s): TSH, FREET4 in the last 168 hours.  BNP Recent Labs  Lab 09/17/21 1317  BNP 1,017.0*    DDimer  Recent Labs  Lab 09/17/21 1317  DDIMER 0.44     Radiology    No results found.  Cardiac Studies   LVEF 25% Diffuse coronary disease without high-grade obstruction.  No significant proximal vessel disease.  Patient Profile     86 y.o. male  with NSTEMI, combined systolic and diastolic HF, CAD with remote stent to the RCA, last cath 07/30/15  patent mid RCA stent / 60% proximal/distal RCA stenosis / distal LCX 75% stenosis. H/O low EF 2015 EF 40-45% -->35% 2016. Recent CP with EF 25%, and CHF on CXR.  Cath 09/20/2021 unchanged.  Assessment & Plan    Acute on chronic systolic and diastolic heart failure: Continue Entresto, carvedilol, Aldactone, and empagliflozin.  Needs a basic metabolic profile in 5 to 7 days.  Needs a basic metabolic profile in 5 to 7 days and cardiology follow-up in 7 days. Continuous atrial fibrillation with poor rate control: Start digoxin .125 mg PO daily.    Please make sure the patient has a scale and  understands the importance of weighing every day under the same circumstances and notifying us if increasing weight by 3 pounds overnight or 5 pounds in 1 week.  For questions or updates, please contact [place] Please consult [email or web address] for contact info under        Signed, Sinclair Grooms, MD  09/22/2021, 12:27 PM

## 2021-09-22 NOTE — Progress Notes (Signed)
Mobility Specialist Progress Note:   09/22/21 1300  Mobility  Bed Position Chair  Activity Ambulated with assistance in hallway  Level of Assistance Standby assist, set-up cues, supervision of patient - no hands on  Assistive Device Front wheel walker  Distance Ambulated (ft) 120 ft  Activity Response Tolerated well  $Mobility charge 1 Mobility   Pt received in chair willing to participate in mobility. No complaints of pain and asymptomatic. Pt left in chair with call bell in reach and all needs met.   [facility] Public librarian Phone [phone] Secondary Phone [phone]

## 2021-09-22 NOTE — Discharge Summary (Addendum)
Physician Discharge Summary  Vincent [name] BDZ:[id number] DOB: [birth year] DOA: 09/17/2021  PCP: [name], MD  Admit date: 09/17/2021 Discharge date: 09/22/2021  Admitted From: home  Disposition:  home   Recommendations for Outpatient Follow-up and new medication changes:  Started on Entresto, discontinue ARB Carvedilol decreased in dose and started on digoxin Increased dose of furosemide to 40 mg daily, in case of edema, or weight gain 3 lbs in 48 hrs or 5 lbs in 5 days increase to twice daily  4.   Changed warfarin to apixaban.     Home Health: yes   Equipment/Devices: na    Discharge Condition: stable  CODE STATUS: full  Diet recommendation:  heart healthy and diabetic prudent.   I spoke with patient's sister at the bedside, we talked in detail about patient's condition, plan of care and prognosis and all questions were addressed.   Brief/Interim Summary: [name] was admitted to the hospital with the working diagnosis of acute on chronic systolic heart failure.   86 yo male with the past medical history of CAD, chronic systolic heart failure, paroxysmal atrial fibrillation, T2DM, CVA, left foot droop, and chronic wounds who presented with dyspnea and chest pain. Reported 4 to 5 days of worsening dyspnea, associated with chest pain. On hid initial physical examination his blood pressure was 136/88, HR 99, RR 16 and Temp 97,8, 02 sat 97%, heart with S1 and S2 present, irregularly irregular, lungs poor air movement, more at bases, abdomen soft and non tender, lower extremities with unna boots bilaterally, but noted edema.   Na 138, K 3,5, cl 104, bicarbonate 27, glucose 193, BUnN 16 and cr 1,14 Wbc 4,2 hgb 12,2, hct 39 plt 193, SARS COVID 19 negative  High sensitive troponin 14, 36, 724, 1360, 1624. 1752 BNP 1,017   Chest radiograph is cardiomegaly with positive hilar vascular congestion.   EKG 104 bpm, normal axis, normal qtc, atrial fibrillation rhythm, with positive  LVH, no significant ST segment or T wave changes.   Patient was placed on furosemide for diuresis. IV heparin for anticoagulation.  01/23 cardiac catheterization with patent stent and stable non obstructive coronary artery disease.   Patient's symptoms improved and he will follow up as outpatient.   Acute heart failure exacerbation.  Patient was admitted to the cardiac ward and was diuresed with IV furosemide.  Negative fluid balance was achieved, - 9,558 ml with improvement in his symptoms.   Further work up with echocardiography showed LV EF 25%, with global hypokinesis, RV systolic function was preserved. Mild to moderate mitral valve regurgitation, moderate tricuspid valve regurgitation. Ascending aorta dilatation 38 mm.   Cardiac catheterization patent stent to the mid rca, borderline obstructive disease distal L Cx. PCWP 22 mmHg. CO 2,6  Moderate pulmonary HTN with mean 39 mmHg   Heart failure regimen has been modified with digoxin and entresto, continue taking carvedilol and spironolactone. Dose of furosemide increased to 40 mg daily.   2. CAD with NSTEMI NSTEM with demand ischemia. Patient was anticoagulated with heparin and underwent cardiac catheterization. Coronaries with stable disease, patent stent at the mid RCA.  Plan to continue with anticoagulation and statin therapy.   3. Atrial fibrillation.  Rate remained well control with carvedilol.  Now patient on digoxin.  Anticoagulation has been changed to apixaban with good toleration.   4. T2DM.  His glucose remained stable, at discharge will resume metformin. He did received insulin sliding scale during hid hospitalization.   5. Chronic lower extremity wounds.  Followed at the wound center, [facility] following, dressing changed 09-21-2022, usually changed weekly  Discharge Diagnoses:  Principal Problem:   Acute on chronic systolic CHF (congestive heart failure) ([place]) Active Problems:   NSTEMI (non-ST elevated myocardial  infarction) ([place])   PAROXYSMAL ATRIAL FIBRILLATION   Essential hypertension   Coronary atherosclerosis   Cerebral artery occlusion with cerebral infarction ([place])   Type 2 diabetes mellitus with foot ulcer (HCC)   Chronic leg wounds   HLD (hyperlipidemia)   Alcoholic cardiomyopathy ([place])   Hyponatremia    Discharge Instructions  Discharge Instructions     Diet - low sodium heart healthy   Complete by: As directed    Discharge instructions   Complete by: As directed    Please follow up with primary care as out patient.   Discharge wound care:   Complete by: As directed    Per wound care clinic.   Increase activity slowly   Complete by: As directed       Allergies as of 09/22/2021   No Known Allergies      Medication List     STOP taking these medications    irbesartan 300 MG tablet Commonly known as: AVAPRO   warfarin 2 MG tablet Commonly known as: COUMADIN       TAKE these medications    Accu-Chek FastClix Lancets Misc USE TO CHECK BLOOD SUGAR QD   Accu-Chek Guide test strip Generic drug: glucose blood USE TO CHECK BLOOD SUGAR QD   acetaminophen 500 MG tablet Commonly known as: TYLENOL Take 2 tablets (1,000 mg total) by mouth every 6 (six) hours as needed.   apixaban 5 MG Tabs tablet Commonly known as: ELIQUIS Take 1 tablet (5 mg total) by mouth 2 (two) times daily.   aspirin 81 MG chewable tablet Chew 81 mg by mouth daily.   atorvastatin 80 MG tablet Commonly known as: LIPITOR Take 1 tablet (80 mg total) by mouth daily. Start taking on: September 23, 2021 What changed:  medication strength See the new instructions.   brimonidine 0.2 % ophthalmic solution Commonly known as: ALPHAGAN Place 1 drop into both eyes 2 (two) times daily.   calcium-vitamin D 250-125 MG-UNIT tablet Commonly known as: OSCAL WITH D Take 1 tablet by mouth daily.   carvedilol 3.125 MG tablet Commonly known as: COREG Take 1 tablet (3.125 mg total) by mouth 2 (two)  times daily with a meal. What changed:  medication strength See the new instructions.   digoxin 0.125 MG tablet Commonly known as: LANOXIN Take 1 tablet (0.125 mg total) by mouth daily. Start taking on: September 23, 2021   famotidine 20 MG tablet Commonly known as: PEPCID Take 20 mg by mouth 2 (two) times daily.   Fish Oil 1000 MG Caps Take 1,000 mg by mouth 2 (two) times daily.   FreeStyle Libre 14 Day Sensor Misc USE AS DIRECTED EVERY 14 DAYS.   furosemide 20 MG tablet Commonly known as: LASIX Take 2 tablets (40 mg total) by mouth daily. What changed: See the new instructions.   isosorbide mononitrate 30 MG 24 hr tablet Commonly known as: IMDUR Take 1 tablet (30 mg total) by mouth daily.   Jardiance 25 MG Tabs tablet Generic drug: empagliflozin Take 25 mg by mouth daily.   latanoprost 0.005 % ophthalmic solution Commonly known as: XALATAN Place 1 drop into both eyes at bedtime.   loratadine 10 MG tablet Commonly known as: CLARITIN Take 10 mg by mouth daily.   meclizine 25  MG tablet Commonly known as: ANTIVERT Take 25 mg by mouth 3 (three) times daily as needed for dizziness.   metFORMIN 500 MG tablet Commonly known as: GLUCOPHAGE Take 500 mg by mouth 2 (two) times daily.   nitroGLYCERIN 0.4 MG/SPRAY spray Commonly known as: NITROLINGUAL Place 1 spray under the tongue every 5 (five) minutes x 3 doses as needed for chest pain. Please call and schedule an appointment   [place] 0.02-0.005 % Soln Generic drug: Netarsudil-Latanoprost Apply 1 drop to eye at bedtime.   sacubitril-valsartan 24-26 MG Commonly known as: ENTRESTO Take 1 tablet by mouth 2 (two) times daily.   spironolactone 25 MG tablet Commonly known as: ALDACTONE TAKE 1 TABLET(25 MG) BY MOUTH DAILY What changed: See the new instructions.               Discharge Care Instructions  (From admission, onward)           Start     Ordered   09/22/21 0000  Discharge wound care:        Comments: Per wound care clinic.   09/22/21 1321            Follow-up Information     [facility]. Go to.   Specialty: Cardiology Why: Wednesday, February 1 @ 11AM for [facility] clinic within [place]. Bring all medications with you. FREE valet parking at [employer], off [address]. Contact information: [address] [id number] [place], [place] Follow up.   Specialty: [place] Why: [facility] will contact you for apt times Contact information: [place]  [address] [phone]                No Known Allergies  Consultations: Cardiology    Procedures/Studies: DG Chest 2 View  Result Date: 09/17/2021 CLINICAL DATA:  Patient with chest pain. EXAM: CHEST - 2 VIEW COMPARISON:  Chest radiograph 02/21/2021. FINDINGS: Stable cardiomegaly. No large area pulmonary consolidation. Minimal basilar atelectasis on the lateral view. Suggestion of a possible small effusion. No pneumothorax. Thoracic spine degenerative changes. IMPRESSION: Cardiomegaly.  Possible small effusions. Electronically Signed   By: Lovey Newcomer M.D.   On: 09/17/2021 10:12   CARDIAC CATHETERIZATION  Result Date: 09/20/2021   Prox LAD lesion is 30% stenosed.   Dist Cx lesion is 70% stenosed.   Prox RCA-1 lesion is 45% stenosed.   Mid RCA lesion is 50% stenosed.   Previously placed Prox RCA-2 stent (unknown type) is  widely patent.   LV end diastolic pressure is mildly elevated.   Hemodynamic findings consistent with moderate pulmonary hypertension. Borderline obstructive disease in the distal LCx. Patent stent in the mid RCA. No change compared to 2016 Mildly elevated LV filling pressures. PCWP 22 mm Hg Moderate pulmonary HTN - mean PAP 39 mm Hg 4.   Preserved cardiac output - index 2.67. Plan: optimize medical therapy for CHF. OK  to resume anticoagulation this pm- will defer to primary team whether to resume coumadin or switch to Eliquis.   ECHOCARDIOGRAM COMPLETE  Result Date: 09/18/2021    ECHOCARDIOGRAM REPORT   Patient Name:   COLBIE DANNER Date of Exam: 09/18/2021 Medical Rec #:  [id number]      Height:       69.0 in Accession #:    [id number]     Weight:  169.9 lb Date of Birth:  1936/07/23       BSA:          1.928 m Patient Age:    9 years       BP:           116/77 mmHg Patient Gender: M              HR:           54 bpm. Exam Location:  Inpatient Procedure: 2D Echo, Cardiac Doppler and Color Doppler Indications:    Dyspnea  History:        Patient has prior history of Echocardiogram examinations, most                 recent 03/18/2014. CAD, Arrythmias:Atrial Fibrillation; Risk                 Factors:Diabetes and Former Smoker. S/P PCI. ETOH abuse. History                 of CVA.  Sonographer:    Clayton Lefort RDCS (AE) Referring Phys: [place]  1. Left ventricular ejection fraction, by estimation, is 25%. The left ventricle has normal function. The left ventricle demonstrates global hypokinesis. Left ventricular diastolic parameters are indeterminate.  2. Right ventricular systolic function is normal. The right ventricular size is normal. There is normal pulmonary artery systolic pressure.  3. Left atrial size was mildly dilated.  4. The mitral valve is abnormal. Mild to moderate mitral valve regurgitation. No evidence of mitral stenosis.  5. Tricuspid valve regurgitation is moderate.  6. The aortic valve is tricuspid. There is mild calcification of the aortic valve. Aortic valve regurgitation is not visualized. Aortic valve sclerosis is present, with no evidence of aortic valve stenosis.  7. Aortic dilatation noted. There is mild dilatation of the ascending aorta, measuring 38 mm.  8. The inferior vena cava is dilated in size with >50% respiratory variability, suggesting right atrial pressure of 8 mmHg.  FINDINGS  Left Ventricle: Left ventricular ejection fraction, by estimation, is 25%. The left ventricle has normal function. The left ventricle demonstrates global hypokinesis. The left ventricular internal cavity size was normal in size. There is no left ventricular hypertrophy. Left ventricular diastolic parameters are indeterminate. Right Ventricle: The right ventricular size is normal. No increase in right ventricular wall thickness. Right ventricular systolic function is normal. There is normal pulmonary artery systolic pressure. The tricuspid regurgitant velocity is 2.36 m/s, and  with an assumed right atrial pressure of 8 mmHg, the estimated right ventricular systolic pressure is 18.2 mmHg. Left Atrium: Left atrial size was mildly dilated. Right Atrium: Right atrial size was normal in size. Pericardium: There is no evidence of pericardial effusion. Mitral Valve: The mitral valve is abnormal. There is mild thickening of the mitral valve leaflet(s). Mild to moderate mitral valve regurgitation. No evidence of mitral valve stenosis. MV peak gradient, 5.8 mmHg. The mean mitral valve gradient is 1.0 mmHg. Tricuspid Valve: The tricuspid valve is normal in structure. Tricuspid valve regurgitation is moderate . No evidence of tricuspid stenosis. Aortic Valve: The aortic valve is tricuspid. There is mild calcification of the aortic valve. Aortic valve regurgitation is not visualized. Aortic valve sclerosis is present, with no evidence of aortic valve stenosis. Aortic valve mean gradient measures 3.0 mmHg. Aortic valve peak gradient measures 4.8 mmHg. Aortic valve area, by VTI measures 1.68 cm. Pulmonic Valve: The pulmonic valve was normal in structure. Pulmonic valve  regurgitation is not visualized. No evidence of pulmonic stenosis. Aorta: Aortic dilatation noted. There is mild dilatation of the ascending aorta, measuring 38 mm. Venous: The inferior vena cava is dilated in size with greater than 50% respiratory  variability, suggesting right atrial pressure of 8 mmHg. IAS/Shunts: No atrial level shunt detected by color flow Doppler.  LEFT VENTRICLE PLAX 2D LVIDd:         6.80 cm LVIDs:         6.10 cm LV PW:         1.30 cm LV IVS:        0.90 cm LVOT diam:     2.10 cm LV SV:         36 LV SV Index:   19 LVOT Area:     3.46 cm  RIGHT VENTRICLE            IVC RV S prime:     6.68 cm/s  IVC diam: 2.10 cm TAPSE (M-mode): 1.2 cm LEFT ATRIUM              Index        RIGHT ATRIUM           Index LA diam:        4.00 cm  2.08 cm/m   RA Area:     27.80 cm LA Vol (A2C):   100.0 ml 51.88 ml/m  RA Volume:   110.00 ml 57.06 ml/m LA Vol (A4C):   110.0 ml 57.06 ml/m LA Biplane Vol: 106.0 ml 54.99 ml/m  AORTIC VALVE AV Area (Vmax):    1.97 cm AV Area (Vmean):   1.75 cm AV Area (VTI):     1.68 cm AV Vmax:           109.00 cm/s AV Vmean:          73.000 cm/s AV VTI:            0.212 m AV Peak Grad:      4.8 mmHg AV Mean Grad:      3.0 mmHg LVOT Vmax:         62.00 cm/s LVOT Vmean:        36.900 cm/s LVOT VTI:          0.103 m LVOT/AV VTI ratio: 0.49  AORTA Ao Root diam: 3.40 cm Ao Asc diam:  3.80 cm MITRAL VALVE                  TRICUSPID VALVE MV Area VTI:  1.06 cm        TR Peak grad:   22.3 mmHg MV Peak grad: 5.8 mmHg        TR Vmax:        236.00 cm/s MV Mean grad: 1.0 mmHg MV Vmax:      1.20 m/s        SHUNTS MV Vmean:     51.3 cm/s       Systemic VTI:  0.10 m MR Peak grad:    55.1 mmHg    Systemic Diam: 2.10 cm MR Mean grad:    33.0 mmHg MR Vmax:         371.00 cm/s MR Vmean:        267.0 cm/s MR PISA:         2.26 cm MR PISA Eff ROA: 21 mm MR PISA Radius:  0.60 cm Jenkins Rouge MD Electronically signed by Jenkins Rouge MD Signature Date/Time: 09/18/2021/1:21:13 PM  Final      Procedures: cardiac catheterization   Subjective: Patient is feeling better, hid dyspnea has improved along with his lower extremity edema, no chest pain   Discharge Exam: Vitals:   09/22/21 1202 09/22/21 1203  BP: 97/72 97/72  Pulse: 87  99  Resp: 20 20  Temp: 98.5 F (36.9 C) 98.5 F (36.9 C)  SpO2: 97%    Vitals:   09/22/21 0518 09/22/21 0750 09/22/21 1202 09/22/21 1203  BP: 105/74 105/77 97/72 97/72   Pulse: 89 97 87 99  Resp: 20 19 20 20   Temp: 98.3 F (36.8 C) 98.3 F (36.8 C) 98.5 F (36.9 C) 98.5 F (36.9 C)  TempSrc: Oral Oral Oral   SpO2: 96% 92% 97%   Weight: 72.6 kg     Height:        General: Not in pain or dyspnea  Neurology: Awake and alert, non focal  E ENT: no pallor, no icterus, oral mucosa moist Cardiovascular: No JVD. S1-S2 present, rhythmic, no gallops, rubs, or murmurs. trace lower extremity edema. Dressings in place.  Pulmonary: positive breath sounds bilaterally, adequate air movement, no wheezing, rhonchi or rales. Gastrointestinal. Abdomen soft and non tender Skin. No rashes Musculoskeletal: no joint deformities   The results of significant diagnostics from this hospitalization (including imaging, microbiology, ancillary and laboratory) are listed below for reference.     Microbiology: Recent Results (from the past 240 hour(s))  Resp Panel by RT-PCR (Flu A&B, Covid) Nasopharyngeal Swab     Status: None   Collection Time: 09/17/21 12:54 PM   Specimen: Nasopharyngeal Swab; Nasopharyngeal(NP) swabs in vial transport medium  Result Value Ref Range Status   SARS Coronavirus 2 by RT PCR NEGATIVE NEGATIVE Final    Comment: (NOTE) SARS-CoV-2 target nucleic acids are NOT DETECTED.  The SARS-CoV-2 RNA is generally detectable in upper respiratory specimens during the acute phase of infection. The lowest concentration of SARS-CoV-2 viral copies this assay can detect is 138 copies/mL. A negative result does not preclude SARS-Cov-2 infection and should not be used as the sole basis for treatment or other patient management decisions. A negative result may occur with  improper specimen collection/handling, submission of specimen other than nasopharyngeal swab, presence of viral  mutation(s) within the areas targeted by this assay, and inadequate number of viral copies(<138 copies/mL). A negative result must be combined with clinical observations, patient history, and epidemiological information. The expected result is Negative.  Fact Sheet for Patients:  EntrepreneurPulse.com.au  Fact Sheet for Healthcare Providers:  IncredibleEmployment.be  This test is no t yet approved or cleared by the [place] FDA and  has been authorized for detection and/or diagnosis of SARS-CoV-2 by FDA under an Emergency Use Authorization (EUA). This EUA will remain  in effect (meaning this test can be used) for the duration of the COVID-19 declaration under Section 564(b)(1) of the Act, 21 U.S.C.section 360bbb-3(b)(1), unless the authorization is terminated  or revoked sooner.       Influenza A by PCR NEGATIVE NEGATIVE Final   Influenza B by PCR NEGATIVE NEGATIVE Final    Comment: (NOTE) The Xpert Xpress SARS-CoV-2/FLU/RSV plus assay is intended as an aid in the diagnosis of influenza from Nasopharyngeal swab specimens and should not be used as a sole basis for treatment. Nasal washings and aspirates are unacceptable for Xpert Xpress SARS-CoV-2/FLU/RSV testing.  Fact Sheet for Patients: EntrepreneurPulse.com.au  Fact Sheet for Healthcare Providers: IncredibleEmployment.be  This test is not yet approved or cleared by the [place]  FDA and has been authorized for detection and/or diagnosis of SARS-CoV-2 by FDA under an Emergency Use Authorization (EUA). This EUA will remain in effect (meaning this test can be used) for the duration of the COVID-19 declaration under Section 564(b)(1) of the Act, 21 U.S.C. section 360bbb-3(b)(1), unless the authorization is terminated or revoked.  Performed at [facility], [place] [address], [place], [place] [address]      Labs: BNP (last 3  results) Recent Labs    02/21/21 1318 09/17/21 1317  BNP 640.0* 1,941.7*   Basic Metabolic Panel: Recent Labs  Lab 09/18/21 0147 09/19/21 0221 09/20/21 0435 09/20/21 0808 09/20/21 0811 09/20/21 0812 09/21/21 0405 09/22/21 0330  NA 140 137 137 138 139 139 134* 132*  K 3.5 3.1* 4.1 3.8 3.8 3.9 3.7 3.6  CL 103 99 104  --   --   --  97* 96*  CO2 29 28 28   --   --   --  28 24  GLUCOSE 128* 114* 128*  --   --   --  121* 132*  BUN 15 17 17   --   --   --  15 19  CREATININE 1.05 1.02 1.03  --   --   --  1.17 1.06  CALCIUM 9.2 8.6* 8.7*  --   --   --  8.6* 8.6*  MG  --   --  1.9  --   --   --   --   --    Liver Function Tests: Recent Labs  Lab 09/18/21 0147  AST 42*  ALT 33  ALKPHOS 86  BILITOT 2.5*  PROT 6.6  ALBUMIN 3.5   No results for input(s): LIPASE, AMYLASE in the last 168 hours. No results for input(s): AMMONIA in the last 168 hours. CBC: Recent Labs  Lab 09/17/21 0952 09/18/21 0147 09/19/21 0221 09/20/21 0435 09/20/21 0808 09/20/21 0811 09/20/21 0812 09/21/21 0405  WBC 4.2 5.2 5.1 6.3  --   --   --  5.9  HGB 12.2* 12.4* 12.9* 13.1 13.3 13.3 13.3 12.6*  HCT 39.0 39.4 40.7 41.4 39.0 39.0 39.0 39.3  MCV 85.7 83.3 83.2 83.0  --   --   --  82.6  PLT 193 183 181 193  --   --   --  190   Cardiac Enzymes: No results for input(s): CKTOTAL, CKMB, CKMBINDEX, TROPONINI in the last 168 hours. BNP: Invalid input(s): POCBNP CBG: Recent Labs  Lab 09/20/21 1626 09/21/21 0756 09/21/21 1136 09/22/21 0620 09/22/21 1136  GLUCAP 144* 141* 176* 124* 134*   D-Dimer No results for input(s): DDIMER in the last 72 hours. Hgb A1c No results for input(s): HGBA1C in the last 72 hours. Lipid Profile Recent Labs    09/22/21 0330  CHOL 145  HDL 59  LDLCALC 77  TRIG 46  CHOLHDL 2.5   Thyroid function studies No results for input(s): TSH, T4TOTAL, T3FREE, THYROIDAB in the last 72 hours.  Invalid input(s): FREET3 Anemia work up No results for input(s):  VITAMINB12, FOLATE, FERRITIN, TIBC, IRON, RETICCTPCT in the last 72 hours. Urinalysis    Component Value Date/Time   COLORURINE STRAW (A) 03/05/2017 1120   APPEARANCEUR CLEAR 03/05/2017 1120   LABSPEC 1.005 03/05/2017 1120   PHURINE 6.0 03/05/2017 1120   GLUCOSEU NEGATIVE 03/05/2017 1120   HGBUR NEGATIVE 03/05/2017 1120   BILIRUBINUR NEGATIVE 03/05/2017 1120   KETONESUR NEGATIVE 03/05/2017 1120   PROTEINUR NEGATIVE 03/05/2017 1120   UROBILINOGEN 0.2 03/05/2015 1115  NITRITE NEGATIVE 03/05/2017 1120   LEUKOCYTESUR NEGATIVE 03/05/2017 1120   Sepsis Labs Invalid input(s): PROCALCITONIN,  WBC,  LACTICIDVEN Microbiology Recent Results (from the past 240 hour(s))  Resp Panel by RT-PCR (Flu A&B, Covid) Nasopharyngeal Swab     Status: None   Collection Time: 09/17/21 12:54 PM   Specimen: Nasopharyngeal Swab; Nasopharyngeal(NP) swabs in vial transport medium  Result Value Ref Range Status   SARS Coronavirus 2 by RT PCR NEGATIVE NEGATIVE Final    Comment: (NOTE) SARS-CoV-2 target nucleic acids are NOT DETECTED.  The SARS-CoV-2 RNA is generally detectable in upper respiratory specimens during the acute phase of infection. The lowest concentration of SARS-CoV-2 viral copies this assay can detect is 138 copies/mL. A negative result does not preclude SARS-Cov-2 infection and should not be used as the sole basis for treatment or other patient management decisions. A negative result may occur with  improper specimen collection/handling, submission of specimen other than nasopharyngeal swab, presence of viral mutation(s) within the areas targeted by this assay, and inadequate number of viral copies(<138 copies/mL). A negative result must be combined with clinical observations, patient history, and epidemiological information. The expected result is Negative.  Fact Sheet for Patients:  EntrepreneurPulse.com.au  Fact Sheet for Healthcare Providers:   IncredibleEmployment.be  This test is no t yet approved or cleared by the [place] FDA and  has been authorized for detection and/or diagnosis of SARS-CoV-2 by FDA under an Emergency Use Authorization (EUA). This EUA will remain  in effect (meaning this test can be used) for the duration of the COVID-19 declaration under Section 564(b)(1) of the Act, 21 U.S.C.section 360bbb-3(b)(1), unless the authorization is terminated  or revoked sooner.       Influenza A by PCR NEGATIVE NEGATIVE Final   Influenza B by PCR NEGATIVE NEGATIVE Final    Comment: (NOTE) The Xpert Xpress SARS-CoV-2/FLU/RSV plus assay is intended as an aid in the diagnosis of influenza from Nasopharyngeal swab specimens and should not be used as a sole basis for treatment. Nasal washings and aspirates are unacceptable for Xpert Xpress SARS-CoV-2/FLU/RSV testing.  Fact Sheet for Patients: EntrepreneurPulse.com.au  Fact Sheet for Healthcare Providers: IncredibleEmployment.be  This test is not yet approved or cleared by the [place] FDA and has been authorized for detection and/or diagnosis of SARS-CoV-2 by FDA under an Emergency Use Authorization (EUA). This EUA will remain in effect (meaning this test can be used) for the duration of the COVID-19 declaration under Section 564(b)(1) of the Act, 21 U.S.C. section 360bbb-3(b)(1), unless the authorization is terminated or revoked.  Performed at [facility], [place] [address], [place], [place] [address]      Time coordinating discharge: 45 minutes  SIGNED:   Tawni Millers, MD  [facility] 09/22/2021, 1:26 PM

## 2021-09-22 NOTE — TOC Transition Note (Signed)
Transition of Care [facility]) - CM/SW Discharge Note   Patient Details  Name: Vincent [name] MRN: [id number] Date of Birth: [birth year]  Transition of Care [facility]) CM/SW Contact:  Zenon Mayo, RN Phone Number: 09/22/2021, 1:03 PM   Clinical Narrative:    Patient is for dc today, she was set up by HF team NCM  for [facility] services, this NCM notified Stacie with [place] patient is for dc today.     Final next level of care: [place] Barriers to Discharge: No Barriers Identified   Patient Goals and CMS Choice Patient states their goals for this hospitalization and ongoing recovery are:: wants patient to remain independent CMS Medicare.gov Compare Post Acute Care list provided to:: Patient Represenative (must comment) (Sister-Marion Winfred Burn) Choice offered to / list presented to : Sibling  Discharge Placement                       Discharge Plan and Services   Discharge Planning Services: CM Consult Post Acute Care Choice: Home Health            DME Agency: NA       HH Arranged: RN, PT [facility] Agency: [place] Date [place]: 09/21/21 Time [place]: 1146 Representative spoke with at [place]: [place] (SDOH) Interventions Food Insecurity Interventions: Intervention Not Indicated Financial Strain Interventions: Intervention Not Indicated Housing Interventions: Intervention Not Indicated Transportation Interventions: Intervention Not Indicated   Readmission Risk Interventions No flowsheet data found.

## 2021-09-22 NOTE — Discharge Summary (Addendum)
Discharge Summary   Patient: Vincent [name] DOB: 01-22-1936 PCP: [name], MD  Date of admission: 09/17/2021  Date of discharge:   Discharge Condition: Patient Left the hospital against medical advice.  Recommendations at discharge:  Started on Entresto, discontinue ARB Carvedilol decreased in dose and started on digoxin  3.    Increased dose of furosemide to 2 daily, in case of edema, or weight gain 3 lbs in 48 hrs or 5 lbs in 5 days increase to  three times daily.  4. Change warfarin to apixaban.   Discharge Diagnoses: Principal Problem:   Acute on chronic systolic CHF (congestive heart failure) (HCC) Active Problems:   NSTEMI (non-ST elevated myocardial infarction) ([place])   PAROXYSMAL ATRIAL FIBRILLATION   Essential hypertension   Coronary atherosclerosis   Cerebral artery occlusion with cerebral infarction ([place])   Type 2 diabetes mellitus with foot ulcer (HCC)   Chronic leg wounds   HLD (hyperlipidemia)   Alcoholic cardiomyopathy (HCC)   Hyponatremia  Resolved Problems:   NSVT (nonsustained ventricular tachycardia)   Hospital Course: Vincent [name] was admitted to the hospital with the working diagnosis of acute on chronic systolic heart failure.   86 yo male with the past medical history of CAD, chronic systolic heart failure, paroxysmal atrial fibrillation, T2DM, CVA, left foot droop, and chronic wounds who presented with dyspnea and chest pain. Reported 4 to 5 days of worsening dyspnea, associated with chest pain. On hid initial physical examination his blood pressure was 136/88, HR 99, RR 16 and Temp 97,8, 02 sat 97%, heart with S1 and S2 present, irregularly irregular, lungs poor air movement, more at bases, abdomen soft and non tender, lower extremities with unna boots bilaterally, but noted edema.   Na 138, K 3,5, cl 104, bicarbonate 27, glucose 193, BUnN 16 and cr 1,14 Wbc 4,2 hgb 12,2, hct 39 plt 193, SARS COVID 19 negative  High sensitive  troponin 14, 36, 724, 1360, 1624. 1752 BNP 1,017   Chest radiograph is cardiomegaly with positive hilar vascular congestion.   EKG 104 bpm, normal axis, normal qtc, atrial fibrillation rhythm, with positive LVH, no significant ST segment or T wave changes.   Patient was placed on furosemide for diuresis. IV heparin for anticoagulation.  01/23 cardiac catheterization with patent stent and stable non obstructive coronary artery disease.   Allergies as of 09/22/2021   No Known Allergies      Medication List     STOP taking these medications    irbesartan 300 MG tablet Commonly known as: AVAPRO   warfarin 2 MG tablet Commonly known as: COUMADIN       TAKE these medications    Accu-Chek FastClix Lancets Misc USE TO CHECK BLOOD SUGAR QD   Accu-Chek Guide test strip Generic drug: glucose blood USE TO CHECK BLOOD SUGAR QD   acetaminophen 500 MG tablet Commonly known as: TYLENOL Take 2 tablets (1,000 mg total) by mouth every 6 (six) hours as needed.   apixaban 5 MG Tabs tablet Commonly known as: ELIQUIS Take 1 tablet (5 mg total) by mouth 2 (two) times daily.   aspirin 81 MG chewable tablet Chew 81 mg by mouth daily.   atorvastatin 80 MG tablet Commonly known as: LIPITOR Take 1 tablet (80 mg total) by mouth daily. Start taking on: September 23, 2021 What changed:  medication strength See the new instructions.   brimonidine 0.2 % ophthalmic solution Commonly known as: ALPHAGAN Place 1 drop into both eyes 2 (two) times daily.  calcium-vitamin D 250-125 MG-UNIT tablet Commonly known as: OSCAL WITH D Take 1 tablet by mouth daily.   carvedilol 3.125 MG tablet Commonly known as: COREG Take 1 tablet (3.125 mg total) by mouth 2 (two) times daily with a meal. What changed:  medication strength See the new instructions.   digoxin 0.125 MG tablet Commonly known as: LANOXIN Take 1 tablet (0.125 mg total) by mouth daily. Start taking on: September 23, 2021    famotidine 20 MG tablet Commonly known as: PEPCID Take 20 mg by mouth 2 (two) times daily.   Fish Oil 1000 MG Caps Take 1,000 mg by mouth 2 (two) times daily.   FreeStyle Libre 14 Day Sensor Misc USE AS DIRECTED EVERY 14 DAYS.   furosemide 20 MG tablet Commonly known as: LASIX Take 2 tablets (40 mg total) by mouth daily. What changed: See the new instructions.   isosorbide mononitrate 30 MG 24 hr tablet Commonly known as: IMDUR Take 1 tablet (30 mg total) by mouth daily.   Jardiance 25 MG Tabs tablet Generic drug: empagliflozin Take 25 mg by mouth daily.   latanoprost 0.005 % ophthalmic solution Commonly known as: XALATAN Place 1 drop into both eyes at bedtime.   loratadine 10 MG tablet Commonly known as: CLARITIN Take 10 mg by mouth daily.   meclizine 25 MG tablet Commonly known as: ANTIVERT Take 25 mg by mouth 3 (three) times daily as needed for dizziness.   metFORMIN 500 MG tablet Commonly known as: GLUCOPHAGE Take 500 mg by mouth 2 (two) times daily.   nitroGLYCERIN 0.4 MG/SPRAY spray Commonly known as: NITROLINGUAL Place 1 spray under the tongue every 5 (five) minutes x 3 doses as needed for chest pain. Please call and schedule an appointment   [place] 0.02-0.005 % Soln Generic drug: Netarsudil-Latanoprost Apply 1 drop to eye at bedtime.   sacubitril-valsartan 24-26 MG Commonly known as: ENTRESTO Take 1 tablet by mouth 2 (two) times daily.   spironolactone 25 MG tablet Commonly known as: ALDACTONE TAKE 1 TABLET(25 MG) BY MOUTH DAILY What changed: See the new instructions.               Discharge Care Instructions  (From admission, onward)           Start     Ordered   09/22/21 0000  Discharge wound care:       Comments: Per wound care clinic.   09/22/21 1321            Consultants: cardiology  Procedures: right and left heart cath   Exam: Filed Weights   09/20/21 0300 09/21/21 0400 09/22/21 0518  Weight: 76 kg 69.9 kg  72.6 kg   BP 97/72    Pulse 99    Temp 98.5 F (36.9 C)    Resp 20    Ht 5\' 9"  (1.753 m)    Wt 72.6 kg Comment: scale c   SpO2 97%    BMI 23.64 kg/m    Neurology Patient is awake and alert Cardiovascular. Heart with S1 and S2 present, with no murmurs or gallops Pulmonary. His lungs are clear with no wheezing, or rhonchi Abdomen soft and non tender Skin with bilateral lower extremities wounds Musculoskeletal with no deformities.   The results of significant diagnostics from this hospitalization (including imaging, microbiology, ancillary and laboratory) are listed below for reference.  Imaging Studies: DG Chest 2 View  Result Date: 09/17/2021 CLINICAL DATA:  Patient with chest pain. EXAM: CHEST - 2 VIEW COMPARISON:  Chest radiograph 02/21/2021. FINDINGS: Stable cardiomegaly. No large area pulmonary consolidation. Minimal basilar atelectasis on the lateral view. Suggestion of a possible small effusion. No pneumothorax. Thoracic spine degenerative changes. IMPRESSION: Cardiomegaly.  Possible small effusions. Electronically Signed   By: Lovey Newcomer M.D.   On: 09/17/2021 10:12   CARDIAC CATHETERIZATION  Result Date: 09/20/2021   Prox LAD lesion is 30% stenosed.   Dist Cx lesion is 70% stenosed.   Prox RCA-1 lesion is 45% stenosed.   Mid RCA lesion is 50% stenosed.   Previously placed Prox RCA-2 stent (unknown type) is  widely patent.   LV end diastolic pressure is mildly elevated.   Hemodynamic findings consistent with moderate pulmonary hypertension. Borderline obstructive disease in the distal LCx. Patent stent in the mid RCA. No change compared to 2016 Mildly elevated LV filling pressures. PCWP 22 mm Hg Moderate pulmonary HTN - mean PAP 39 mm Hg 4.   Preserved cardiac output - index 2.67. Plan: optimize medical therapy for CHF. OK to resume anticoagulation this pm- will defer to primary team whether to resume coumadin or switch to Eliquis.   ECHOCARDIOGRAM COMPLETE  Result Date: 09/18/2021     ECHOCARDIOGRAM REPORT   Patient Name:   Emerson WINFIELD CABA Date of Exam: 09/18/2021 Medical Rec #:  [id number]      Height:       69.0 in Accession #:    [id number]     Weight:       169.9 lb Date of Birth:  [birth year]       BSA:          1.928 m Patient Age:    27 years       BP:           116/77 mmHg Patient Gender: M              HR:           54 bpm. Exam Location:  Inpatient Procedure: 2D Echo, Cardiac Doppler and Color Doppler Indications:    Dyspnea  History:        Patient has prior history of Echocardiogram examinations, most                 recent 03/18/2014. CAD, Arrythmias:Atrial Fibrillation; Risk                 Factors:Diabetes and Former Smoker. S/P PCI. ETOH abuse. History                 of CVA.  Sonographer:    Clayton Lefort RDCS (AE) Referring Phys: [place]  1. Left ventricular ejection fraction, by estimation, is 25%. The left ventricle has normal function. The left ventricle demonstrates global hypokinesis. Left ventricular diastolic parameters are indeterminate.  2. Right ventricular systolic function is normal. The right ventricular size is normal. There is normal pulmonary artery systolic pressure.  3. Left atrial size was mildly dilated.  4. The mitral valve is abnormal. Mild to moderate mitral valve regurgitation. No evidence of mitral stenosis.  5. Tricuspid valve regurgitation is moderate.  6. The aortic valve is tricuspid. There is mild calcification of the aortic valve. Aortic valve regurgitation is not visualized. Aortic valve sclerosis is present, with no evidence of aortic valve stenosis.  7. Aortic dilatation noted. There is mild dilatation of the ascending aorta, measuring 38 mm.  8. The inferior vena cava is dilated in size with >50% respiratory variability, suggesting right atrial pressure of 8 mmHg.  FINDINGS  Left Ventricle: Left ventricular ejection fraction, by estimation, is 25%. The left ventricle has normal function. The left ventricle demonstrates global  hypokinesis. The left ventricular internal cavity size was normal in size. There is no left ventricular hypertrophy. Left ventricular diastolic parameters are indeterminate. Right Ventricle: The right ventricular size is normal. No increase in right ventricular wall thickness. Right ventricular systolic function is normal. There is normal pulmonary artery systolic pressure. The tricuspid regurgitant velocity is 2.36 m/s, and  with an assumed right atrial pressure of 8 mmHg, the estimated right ventricular systolic pressure is 82.9 mmHg. Left Atrium: Left atrial size was mildly dilated. Right Atrium: Right atrial size was normal in size. Pericardium: There is no evidence of pericardial effusion. Mitral Valve: The mitral valve is abnormal. There is mild thickening of the mitral valve leaflet(s). Mild to moderate mitral valve regurgitation. No evidence of mitral valve stenosis. MV peak gradient, 5.8 mmHg. The mean mitral valve gradient is 1.0 mmHg. Tricuspid Valve: The tricuspid valve is normal in structure. Tricuspid valve regurgitation is moderate . No evidence of tricuspid stenosis. Aortic Valve: The aortic valve is tricuspid. There is mild calcification of the aortic valve. Aortic valve regurgitation is not visualized. Aortic valve sclerosis is present, with no evidence of aortic valve stenosis. Aortic valve mean gradient measures 3.0 mmHg. Aortic valve peak gradient measures 4.8 mmHg. Aortic valve area, by VTI measures 1.68 cm. Pulmonic Valve: The pulmonic valve was normal in structure. Pulmonic valve regurgitation is not visualized. No evidence of pulmonic stenosis. Aorta: Aortic dilatation noted. There is mild dilatation of the ascending aorta, measuring 38 mm. Venous: The inferior vena cava is dilated in size with greater than 50% respiratory variability, suggesting right atrial pressure of 8 mmHg. IAS/Shunts: No atrial level shunt detected by color flow Doppler.  LEFT VENTRICLE PLAX 2D LVIDd:         6.80 cm  LVIDs:         6.10 cm LV PW:         1.30 cm LV IVS:        0.90 cm LVOT diam:     2.10 cm LV SV:         36 LV SV Index:   19 LVOT Area:     3.46 cm  RIGHT VENTRICLE            IVC RV S prime:     6.68 cm/s  IVC diam: 2.10 cm TAPSE (M-mode): 1.2 cm LEFT ATRIUM              Index        RIGHT ATRIUM           Index LA diam:        4.00 cm  2.08 cm/m   RA Area:     27.80 cm LA Vol (A2C):   100.0 ml 51.88 ml/m  RA Volume:   110.00 ml 57.06 ml/m LA Vol (A4C):   110.0 ml 57.06 ml/m LA Biplane Vol: 106.0 ml 54.99 ml/m  AORTIC VALVE AV Area (Vmax):    1.97 cm AV Area (Vmean):   1.75 cm AV Area (VTI):     1.68 cm AV Vmax:           109.00 cm/s AV Vmean:          73.000 cm/s AV VTI:            0.212 m AV Peak Grad:      4.8 mmHg  AV Mean Grad:      3.0 mmHg LVOT Vmax:         62.00 cm/s LVOT Vmean:        36.900 cm/s LVOT VTI:          0.103 m LVOT/AV VTI ratio: 0.49  AORTA Ao Root diam: 3.40 cm Ao Asc diam:  3.80 cm MITRAL VALVE                  TRICUSPID VALVE MV Area VTI:  1.06 cm        TR Peak grad:   22.3 mmHg MV Peak grad: 5.8 mmHg        TR Vmax:        236.00 cm/s MV Mean grad: 1.0 mmHg MV Vmax:      1.20 m/s        SHUNTS MV Vmean:     51.3 cm/s       Systemic VTI:  0.10 m MR Peak grad:    55.1 mmHg    Systemic Diam: 2.10 cm MR Mean grad:    33.0 mmHg MR Vmax:         371.00 cm/s MR Vmean:        267.0 cm/s MR PISA:         2.26 cm MR PISA Eff ROA: 21 mm MR PISA Radius:  0.60 cm Jenkins Rouge MD Electronically signed by Jenkins Rouge MD Signature Date/Time: 09/18/2021/1:21:13 PM    Final     Microbiology: Results for orders placed or performed during the hospital encounter of 09/17/21  Resp Panel by RT-PCR (Flu A&B, Covid) Nasopharyngeal Swab     Status: None   Collection Time: 09/17/21 12:54 PM   Specimen: Nasopharyngeal Swab; Nasopharyngeal(NP) swabs in vial transport medium  Result Value Ref Range Status   SARS Coronavirus 2 by RT PCR NEGATIVE NEGATIVE Final    Comment: (NOTE) SARS-CoV-2  target nucleic acids are NOT DETECTED.  The SARS-CoV-2 RNA is generally detectable in upper respiratory specimens during the acute phase of infection. The lowest concentration of SARS-CoV-2 viral copies this assay can detect is 138 copies/mL. A negative result does not preclude SARS-Cov-2 infection and should not be used as the sole basis for treatment or other patient management decisions. A negative result may occur with  improper specimen collection/handling, submission of specimen other than nasopharyngeal swab, presence of viral mutation(s) within the areas targeted by this assay, and inadequate number of viral copies(<138 copies/mL). A negative result must be combined with clinical observations, patient history, and epidemiological information. The expected result is Negative.  Fact Sheet for Patients:  EntrepreneurPulse.com.au  Fact Sheet for Healthcare Providers:  IncredibleEmployment.be  This test is no t yet approved or cleared by the [place] FDA and  has been authorized for detection and/or diagnosis of SARS-CoV-2 by FDA under an Emergency Use Authorization (EUA). This EUA will remain  in effect (meaning this test can be used) for the duration of the COVID-19 declaration under Section 564(b)(1) of the Act, 21 U.S.C.section 360bbb-3(b)(1), unless the authorization is terminated  or revoked sooner.       Influenza A by PCR NEGATIVE NEGATIVE Final   Influenza B by PCR NEGATIVE NEGATIVE Final    Comment: (NOTE) The Xpert Xpress SARS-CoV-2/FLU/RSV plus assay is intended as an aid in the diagnosis of influenza from Nasopharyngeal swab specimens and should not be used as a sole basis for treatment. Nasal washings and aspirates are unacceptable for Xpert Xpress SARS-CoV-2/FLU/RSV  testing.  Fact Sheet for Patients: EntrepreneurPulse.com.au  Fact Sheet for Healthcare  Providers: IncredibleEmployment.be  This test is not yet approved or cleared by the [place] FDA and has been authorized for detection and/or diagnosis of SARS-CoV-2 by FDA under an Emergency Use Authorization (EUA). This EUA will remain in effect (meaning this test can be used) for the duration of the COVID-19 declaration under Section 564(b)(1) of the Act, 21 U.S.C. section 360bbb-3(b)(1), unless the authorization is terminated or revoked.  Performed at [facility], [place] [address] [place], [place] [address]     Labs:  CBC: Recent Labs  Lab 09/17/21 [phone] 09/18/21 0147 09/19/21 0221 09/20/21 0435 09/20/21 0808 09/20/21 0811 09/20/21 0812 09/21/21 0405  WBC 4.2 5.2 5.1 6.3  --   --   --  5.9  HGB 12.2* 12.4* 12.9* 13.1 13.3 13.3 13.3 12.6*  HCT 39.0 39.4 40.7 41.4 39.0 39.0 39.0 39.3  MCV 85.7 83.3 83.2 83.0  --   --   --  82.6  PLT 193 183 181 193  --   --   --  371   Basic Metabolic Panel: Recent Labs  Lab 09/18/21 0147 09/19/21 0221 09/20/21 0435 09/20/21 0808 09/20/21 0811 09/20/21 0812 09/21/21 0405 09/22/21 0330  NA 140 137 137 138 139 139 134* 132*  K 3.5 3.1* 4.1 3.8 3.8 3.9 3.7 3.6  CL 103 99 104  --   --   --  97* 96*  CO2 29 28 28   --   --   --  28 24  GLUCOSE 128* 114* 128*  --   --   --  121* 132*  BUN 15 17 17   --   --   --  15 19  CREATININE 1.05 1.02 1.03  --   --   --  1.17 1.06  CALCIUM 9.2 8.6* 8.7*  --   --   --  8.6* 8.6*  MG  --   --  1.9  --   --   --   --   --    Liver Function Tests: Recent Labs  Lab 09/18/21 0147  AST 42*  ALT 33  ALKPHOS 86  BILITOT 2.5*  PROT 6.6  ALBUMIN 3.5   CBG: Recent Labs  Lab 09/20/21 1626 09/21/21 0756 09/21/21 1136 09/22/21 0620 09/22/21 1136  GLUCAP 144* 141* 176* 124* 134*    Time spent: 35 minutes  Author: Tawni Millers, MD

## 2021-09-22 NOTE — Hospital Course (Addendum)
Vincent [name] was admitted to the hospital with the working diagnosis of acute on chronic systolic heart failure.   86 yo male with the past medical history of CAD, chronic systolic heart failure, paroxysmal atrial fibrillation, T2DM, CVA, left foot droop, and chronic wounds who presented with dyspnea and chest pain. Reported 4 to 5 days of worsening dyspnea, associated with chest pain. On hid initial physical examination his blood pressure was 136/88, HR 99, RR 16 and Temp 97,8, 02 sat 97%, heart with S1 and S2 present, irregularly irregular, lungs poor air movement, more at bases, abdomen soft and non tender, lower extremities with unna boots bilaterally, but noted edema.   Na 138, K 3,5, cl 104, bicarbonate 27, glucose 193, BUnN 16 and cr 1,14 Wbc 4,2 hgb 12,2, hct 39 plt 193, SARS COVID 19 negative  High sensitive troponin 14, 36, 724, 1360, 1624. 1752 BNP 1,017   Chest radiograph is cardiomegaly with positive hilar vascular congestion.   EKG 104 bpm, normal axis, normal qtc, atrial fibrillation rhythm, with positive LVH, no significant ST segment or T wave changes.   Patient was placed on furosemide for diuresis. IV heparin for anticoagulation.  01/23 cardiac catheterization with patent stent and stable non obstructive coronary artery disease.   Patient's symptoms improved and he will follow up as outpatient.

## 2021-09-22 NOTE — Progress Notes (Signed)
Heart Failure Stewardship Pharmacist Progress Note   PCP: [name], MD PCP-Cardiologist: Ena Dawley, MD    HPI:  86 yo M with PMH of CAD, CHF, stable angina, afib, T2DM, CVA, and chronic wounds. He presented to the ED on 1/20 with shortness of breath and chest pain. Admitted for CHF and NSTEMI. CXR with cardiomegaly and possible small effusions. An ECHO was done on 1/21 and LVEF was 25% (previously 40-45% in July 2015). R/LHC done on 1/23 and found to have borderline obstructive disease in the LCx, patent stent in RCA, and elevated filling pressures.   Current HF Medications: Beta Blocker: carvedilol 3.125 mg BID ACE/ARB/ARNI: Entresto 24/26 mg BID SGLT2i: Jardiance 25 mg daily Other: digoxin 0.125 mg daily  Prior to admission HF Medications: Diuretic: furosemide 20 mg daily Beta blocker: carvedilol 6.25 mg BID ACE/ARB/ARNI: irbesartan 300 mg daily Aldosterone Antagonist: spironolactone 25 mg daily SGLT2i: Jardiance 25 mg daily  Other: Imdur 30 mg daily  Pertinent Lab Values: Serum creatinine 1.06, BUN 19, Potassium 3.6, Sodium 132, BNP 1017, Magnesium 1.9, A1c 7.0   Vital Signs: Weight: 160 lbs (admission weight: 175 lbs) Blood pressure: 100/70s  Heart rate: 80-100s  I/O: -576mL yesterday; net -9.6L  Medication Assistance / Insurance Benefits Check: Does the patient have prescription insurance?  Yes Type of insurance plan: [place] [employer]   Outpatient Pharmacy:  Prior to admission outpatient pharmacy: [facility] Is the patient willing to use [facility] at discharge? Yes Is the patient willing to transition their outpatient pharmacy to utilize a [facility] outpatient pharmacy?   Pending    Assessment: 1. Acute on chronic systolic CHF (EF 74%), due to ICM. NYHA class II symptoms. - Off IV lasix - Continue carvedilol 3.125 mg BID - Continue Entresto 24/26 mg BID - Off spironolactone with hypotension - Continue Jardiance 25 mg daily - Off Imdur to make BP  room for Entresto  - Continue digoxin 0.125 mg daily - level due in 1 week    Plan: 1) Medication changes recommended at this time: - Continue current meds  2) Patient assistance: - Entresto copay $10.35  3)  Education  - To be completed prior to discharge  Kerby Nora, PharmD, BCPS Heart Failure Cytogeneticist Phone [phone]

## 2021-09-23 ENCOUNTER — Telehealth: Payer: Self-pay | Admitting: Cardiovascular Disease

## 2021-09-23 NOTE — Telephone Encounter (Signed)
Received message from Dr. [name] to let pt know not to take Furosemide everyday as it will drop his BP too low.  We can add back if swelling or SOB.  Called pt and made him aware of information.  He then gave the phone to his sister.  Made her aware of recommendations.  Advised to monitor weight daily.  Told her to keep appt with HF clinic on 2/1.  She thanked me for the call.

## 2021-09-28 NOTE — Progress Notes (Signed)
HEART & VASCULAR TRANSITION OF CARE CONSULT NOTE     Referring Physician: Dr Cathlean Sauer Primary Care: Dr [name]  Primary Cardiologist: Previously Dr Meda Coffee  EP: Dr Caryl Comes  HPI: Referred to clinic by Dr Martin[place] for heart failure consultation.   Vincent [name] is a 86 year old with history of HFrEF, CAD, chronic systolic heart failure, chronic a fib, T2DM, CVA, left foot droop, and chronic wounds.  Admitted with increased shortness of breath in the setting of acute heart failure. On arrival EKG showed A fib RVR with rate in the 100s. Diuresed with IV lasix. Echo completed with EF 25%. Had cath borderline obstructive disease in the distal LCx. Patent stent in the mid RCA. No change compared to 2016. GDMT started.   Overall feeling fine. Denies SOB/PND/Orthopnea. No bleeding issues. Appetite ok. No fever or chills. He doesn't remember his weight. Taking all medications but not sure about the names of his medications. His sister drives him to appointments.  He is followed at the [facility] for chronic lower extremity wounds.   Cardiac Testing Echo 09/18/21 EF 25 % RV normal Echo 2015 EF 40-45%.   Cath 08/2021  Prox LAD lesion is 30% stenosed.   Dist Cx lesion is 70% stenosed.   Prox RCA-1 lesion is 45% stenosed.   Mid RCA lesion is 50% stenosed.   Previously placed Prox RCA-2 stent (unknown type) is  widely patent.   LV end diastolic pressure is mildly elevated.   Hemodynamic findings consistent with moderate pulmonary hypertension.  Borderline obstructive disease in the distal LCx. Patent stent in the mid RCA. No change compared to 2016 Mildly elevated LV filling pressures. PCWP 22 mm Hg Moderate pulmonary HTN - mean PAP 39 mm Hg 4.   Preserved cardiac output - index 2.67.   Review of Systems: [y] = yes, [ ]  = no   General: Weight gain [ ] ; Weight loss [ ] ; Anorexia [ ] ; Fatigue [ ] ; Fever [ ] ; Chills [ ] ; Weakness [ Y]  Cardiac: Chest pain/pressure [ ] ; Resting SOB [ ] ; Exertional SOB  [ ] ; Orthopnea [ ] ; Pedal Edema [ ] ; Palpitations [ ] ; Syncope [ ] ; Presyncope [ ] ; Paroxysmal nocturnal dyspnea[ ]   Pulmonary: Cough [ ] ; Wheezing[ ] ; Hemoptysis[ ] ; Sputum [ ] ; Snoring [ ]   GI: Vomiting[ ] ; Dysphagia[ ] ; Melena[ ] ; Hematochezia [ ] ; Heartburn[ ] ; Abdominal pain [ ] ; Constipation [ ] ; Diarrhea [ ] ; BRBPR [ ]   GU: Hematuria[ ] ; Dysuria [ ] ; Nocturia[ ]   Vascular: Pain in legs with walking [ ] ; Pain in feet with lying flat [ ] ; Non-healing sores [ ] ; Stroke [ Y]; TIA [ ] ; Slurred speech [ ] ;  Neuro: Headaches[ ] ; Vertigo[ ] ; Seizures[ ] ; Paresthesias[ ] ;Blurred vision [ ] ; Diplopia [ ] ; Vision changes [ ]   Ortho/Skin: Arthritis [ ] ; Joint pain [ Y]; Muscle pain [ ] ; Joint swelling [ ] ; Back Pain [Y ]; Rash [ ]   Psych: Depression[ ] ; Anxiety[ ]   Heme: Bleeding problems [ ] ; Clotting disorders [ ] ; Anemia [ ]   Endocrine: Diabetes [Y ]; Thyroid dysfunction[ ]    Past Medical History:  Diagnosis Date   Alcoholic cardiomyopathy ([place])    Bradycardia    Chronic systolic CHF (congestive heart failure) (HCC)    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 s/p remote stent to Cx 2004 with chronic stable angina  in context of residual circumflex disease.   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 30 tablet 0   apixaban (ELIQUIS) 5 MG TABS tablet Take 1 tablet (5 mg total) by mouth 2 (two) times daily. 60 tablet 0   aspirin 81 MG chewable tablet Chew 81 mg by mouth daily.     atorvastatin (LIPITOR) 80 MG tablet Take 1 tablet (80 mg total) by mouth daily. 30 tablet 0   brimonidine (ALPHAGAN) 0.2 % ophthalmic solution Place 1 drop into both eyes 2 (two) times daily.  5   calcium-vitamin D (OSCAL WITH D) 250-125 MG-UNIT tablet Take 1 tablet by mouth daily.     carvedilol (COREG) 3.125 MG tablet Take 1 tablet (3.125 mg total) by mouth 2 (two) times daily with a meal. 60 tablet 0   Continuous Blood Gluc Sensor (FREESTYLE LIBRE 14 DAY SENSOR) MISC USE AS DIRECTED EVERY 14 DAYS.     digoxin (LANOXIN) 0.125 MG tablet Take 1 tablet (0.125 mg total) by mouth daily. 30 tablet 0   famotidine (PEPCID) 20 MG tablet Take 20 mg by mouth 2 (two) times daily.     furosemide (LASIX) 20 MG tablet Take 2 tablets (40 mg total) by mouth daily. 60 tablet 0   isosorbide mononitrate (IMDUR) 30 MG 24 hr tablet Take 1 tablet (30 mg total) by mouth daily. 90 tablet 3   JARDIANCE 25 MG TABS tablet Take 25 mg by mouth daily.     latanoprost (XALATAN) 0.005 % ophthalmic solution Place 1 drop into both eyes at bedtime.     loratadine (CLARITIN) 10 MG tablet Take 10 mg by mouth daily.     meclizine (ANTIVERT) 25 MG tablet Take 25 mg by mouth 3 (three) times daily as needed for dizziness.     metFORMIN (GLUCOPHAGE) 500 MG tablet Take 500 mg by mouth 2 (two) times daily.     nitroGLYCERIN (NITROLINGUAL) 0.4 MG/SPRAY spray Place 1 spray under the tongue every 5 (five) minutes x 3 doses as needed for chest pain. Please call and schedule an appointment 12 g 0   Omega-3 Fatty Acids (FISH OIL) 1000 MG CAPS Take 1,000 mg by  mouth 2 (two) times daily.      ROCKLATAN 0.02-0.005 % SOLN Apply 1 drop to eye at bedtime.     sacubitril-valsartan (ENTRESTO) 24-26 MG Take 1 tablet by mouth 2 (two) times daily. 60 tablet 0   spironolactone (ALDACTONE) 25 MG tablet TAKE 1 TABLET(25 MG) BY MOUTH DAILY 90 tablet 3   No current facility-administered medications for this encounter.  No Known Allergies    Social History   Socioeconomic History   Marital status: Divorced    Spouse name: Not on file   Number of children: 0   Years of education: master's   Highest education level: Master's degree (e.g., MA, MS, MEng, MEd, MSW, MBA)  Occupational History   Occupation: semi-retired  Tobacco Use   Smoking status: Former    Packs/day: 0.50    Years: 15.00    Pack years: 7.50    Types: Cigarettes    Quit date: 08/22/1983    Years since quitting: 38.1   Smokeless tobacco: Never  Vaping Use   Vaping Use: Never used  Substance and Sexual Activity   Alcohol use: No   Drug use: No   Sexual activity: Not on file  Other Topics Concern   Not on file  Social History Narrative   Patient drinks 1-2 cups of caffeine daily.   Patient is right handed.   Social Determinants of Health   Financial Resource Strain: Low Risk    Difficulty of Paying Living Expenses: Not very hard  Food Insecurity: No Food Insecurity   Worried About Charity fundraiser in the Last Year: Never true   Ran Out of Food in the Last Year: Never true  Transportation Needs: No Transportation Needs   Lack of Transportation (Medical): No   Lack of Transportation (Non-Medical): No  Physical Activity: Not on file  Stress: Not on file  Social Connections: Not on file  Intimate Partner Violence: Not on file      Family History  Problem Relation Age of Onset   Colon cancer Mother    Hypertension Mother    Cancer Mother    Prostate cancer Brother    Cancer Brother    Heart disease Father    Heart attack Father    Hypertension Father    Heart  attack Sister    Cancer Sister    Stroke Neg Hx    Colon polyps Neg Hx    Esophageal cancer Neg Hx    Stomach cancer Neg Hx    Rectal cancer Neg Hx    Pancreatic cancer Neg Hx     Vitals:   09/29/21 1057  BP: 132/70  Pulse: 60  SpO2: 97%  Weight: 76.7 kg   Wt Readings from Last 3 Encounters:  09/29/21 76.7 kg  09/22/21 72.6 kg  08/11/20 81.6 kg  Reds CLip 28%  PHYSICAL EXAM: General:  No respiratory difficulty. Walked in the clinic.  HEENT: normal Neck: supple. no JVD. Carotids 2+ bilat; no bruits. No lymphadenopathy or thryomegaly appreciated. Cor: PMI nondisplaced. Irregular rate & rhythm. No rubs, gallops or murmurs. Lungs: clear Abdomen: soft, nontender, nondistended. No hepatosplenomegaly. No bruits or masses. Good bowel sounds. Extremities: no cyanosis, clubbing, rash, R and LLE unna boots. Neuro: alert & oriented x 3, cranial nerves grossly intact. moves all 4 extremities w/o difficulty. Affect pleasant.  ECG: A fib 61 bpm    ASSESSMENT & PLAN: Chronic HFrEF, ICM  Echo 09/18/21 Ef 25% . Back in 2015 EF 40-45% Reds Clip 28%. Not suggestive of volume overload.  NYHA II. Volume status stable. Continue lasix 40 mg daily  -Continue carvedilol 3.125 mg twice a day.  - Continue entresto 24-26 mg twice a day  -Continue spironolactone 25 mg daily - Continue current dose of jardiance Check BMET I am not going to titrate meds today because he is not sure what he is taking.  - Plan to  repeat ECHO after HF meds optimized.   2. Chronic A fib -Has been in A Fib for many years (I reviewed EKGs back to 2016)   -Rate controlled.  -Continue coreg 3.125 twice a day - Continue eliquis 5 mg twice a day   3. CAD  - Diffuse coronary disease - LHC 08/2020 Had cath borderline obstructive disease in the distal LCx. Patent stent in the mid RCA. No change compared to 2016. - No chest pain  - on high intensity statin, eliquis, imdur  Check CBC, BMET today . He was instructed to  bring all medications to his next appointment.   Referred to HFSW (PCP, Medications, Transportation, ETOH Abuse, Drug Abuse, Insurance, Financial ):  No Refer to Pharmacy: Yes  Refer to Home Health:  No Refer to Advanced Heart Failure Clinic: Yes ---> Dr Aundra Dubin  Refer to General Cardiology: Previously Dr Meda Coffee patient   Follow up  2-3 week with pharmacy and 6 weeks with Dr Precious Bard NP-C  2:24 PM

## 2021-09-29 ENCOUNTER — Ambulatory Visit (HOSPITAL_COMMUNITY)
Admission: RE | Admit: 2021-09-29 | Discharge: 2021-09-29 | Disposition: A | Discharge status: Home / Self Care | Payer: Medicare Other | Source: Ambulatory Visit | Attending: Adult Health | Admitting: Adult Health

## 2021-09-29 ENCOUNTER — Telehealth (HOSPITAL_COMMUNITY): Payer: Self-pay | Admitting: *Deleted

## 2021-09-29 ENCOUNTER — Other Ambulatory Visit: Payer: Self-pay

## 2021-09-29 VITALS — BP 132/70 | HR 60 | Wt 169.1 lb

## 2021-09-29 DIAGNOSIS — I5042 Chronic combined systolic (congestive) and diastolic (congestive) heart failure: Secondary | ICD-10-CM

## 2021-09-29 DIAGNOSIS — I4821 Permanent atrial fibrillation: Secondary | ICD-10-CM | POA: Diagnosis not present

## 2021-09-29 DIAGNOSIS — Z87891 Personal history of nicotine dependence: Secondary | ICD-10-CM | POA: Insufficient documentation

## 2021-09-29 DIAGNOSIS — I5022 Chronic systolic (congestive) heart failure: Secondary | ICD-10-CM | POA: Insufficient documentation

## 2021-09-29 DIAGNOSIS — Z8673 Personal history of transient ischemic attack (TIA), and cerebral infarction without residual deficits: Secondary | ICD-10-CM | POA: Insufficient documentation

## 2021-09-29 DIAGNOSIS — I2510 Atherosclerotic heart disease of native coronary artery without angina pectoris: Secondary | ICD-10-CM | POA: Insufficient documentation

## 2021-09-29 DIAGNOSIS — Z7901 Long term (current) use of anticoagulants: Secondary | ICD-10-CM | POA: Insufficient documentation

## 2021-09-29 DIAGNOSIS — Z79899 Other long term (current) drug therapy: Secondary | ICD-10-CM | POA: Diagnosis not present

## 2021-09-29 DIAGNOSIS — Z955 Presence of coronary angioplasty implant and graft: Secondary | ICD-10-CM | POA: Diagnosis not present

## 2021-09-29 DIAGNOSIS — I482 Chronic atrial fibrillation, unspecified: Secondary | ICD-10-CM | POA: Diagnosis not present

## 2021-09-29 DIAGNOSIS — E119 Type 2 diabetes mellitus without complications: Secondary | ICD-10-CM | POA: Diagnosis not present

## 2021-09-29 DIAGNOSIS — I429 Cardiomyopathy, unspecified: Secondary | ICD-10-CM | POA: Diagnosis not present

## 2021-09-29 DIAGNOSIS — Z7984 Long term (current) use of oral hypoglycemic drugs: Secondary | ICD-10-CM | POA: Diagnosis not present

## 2021-09-29 DIAGNOSIS — I110 Hypertensive heart disease with heart failure: Secondary | ICD-10-CM | POA: Insufficient documentation

## 2021-09-29 LAB — CBC
HCT: 40.8 % (ref 39.0–52.0)
Hemoglobin: 12.6 g/dL — ABNORMAL LOW (ref 13.0–17.0)
MCH: 26.1 pg (ref 26.0–34.0)
MCHC: 30.9 g/dL (ref 30.0–36.0)
MCV: 84.5 fL (ref 80.0–100.0)
Platelets: 301 10*3/uL (ref 150–400)
RBC: 4.83 MIL/uL (ref 4.22–5.81)
RDW: 15.8 % — ABNORMAL HIGH (ref 11.5–15.5)
WBC: 3.8 10*3/uL — ABNORMAL LOW (ref 4.0–10.5)
nRBC: 0 % (ref 0.0–0.2)

## 2021-09-29 LAB — BASIC METABOLIC PANEL
Anion gap: 9 (ref 5–15)
BUN: 14 mg/dL (ref 8–23)
CO2: 26 mmol/L (ref 22–32)
Calcium: 8.9 mg/dL (ref 8.9–10.3)
Chloride: 103 mmol/L (ref 98–111)
Creatinine, Ser: 1.13 mg/dL (ref 0.61–1.24)
GFR, Estimated: >60 mL/min (ref >60)
Glucose, Bld: 118 mg/dL — ABNORMAL HIGH (ref 70–99)
Potassium: 4.1 mmol/L (ref 3.5–5.1)
Sodium: 138 mmol/L (ref 135–145)

## 2021-09-29 NOTE — Addendum Note (Signed)
Encounter addended by: Conrad Pe[place], NP on: 09/29/2021 2:27 PM  Actions taken: Level of Service modified

## 2021-09-29 NOTE — Progress Notes (Signed)
ReDS Vest / Clip - 09/29/21 1100       ReDS Vest / Clip   Station Marker C    Ruler Value 27    ReDS Value Range Low volume    ReDS Actual Value 28

## 2021-09-29 NOTE — Patient Instructions (Signed)
Continue current medications  Labs done today, we will call you for abnormal results  Thank you for allowing [place] to provider your heart failure care after your recent hospitalization. Please follow-up with out pharmacy team in 2-3 weeks and with our [facility] in 6-8 weeks  If you have any questions, issues, or concerns before your next appointment please call our office at [phone], opt. [phone] and leave a message for the triage nurse.  Do the following things EVERYDAY: Weigh yourself in the morning before breakfast. Write it down and keep it in a log. Take your medicines as prescribed Eat low salt foods--Limit salt (sodium) to 2000 mg per day.  Stay as active as you can everyday Limit all fluids for the day to less than 2 liters

## 2021-09-29 NOTE — Telephone Encounter (Signed)
Called to confirm Heart & Vascular Transitions of Care appointment today at 11am. Patient reminded to bring all medications and pill box organizer with them. Confirmed patient has transportation. Gave directions, instructed to utilize [address] parking.  Confirmed appointment prior to ending call.

## 2021-09-30 ENCOUNTER — Telehealth (HOSPITAL_COMMUNITY): Payer: Self-pay | Admitting: Pharmacist

## 2021-09-30 ENCOUNTER — Other Ambulatory Visit (HOSPITAL_COMMUNITY): Payer: Self-pay

## 2021-09-30 NOTE — Telephone Encounter (Signed)
Pharmacy Transitions of Care Follow-up Telephone Call  Date of discharge: 09/22/2021 Discharge Diagnosis: CHF  How have you been since you were released from the hospital? Good   Medication changes made at discharge:  - START: Eliquis, Digoxin, entresto  - STOPPED: warfarin, irbesartan  - CHANGED: atorvastatin, carvedilol, furosemide  Medication changes verified by the patient? Yes (Yes/No)    Medication Accessibility:  Was the patient provided with refills on discharged medications? No    Medication Review:  APIXABAN (ELIQUIS)  Apixaban 10 mg BID initiated on . Will switch to apixaban 5 mg BID after 7 days (DATE).  - Discussed importance of taking medication around the same time everyday  - Reviewed potential DDIs with patient  - Advised patient of medications to avoid (NSAIDs, ASA)  - Educated that Tylenol (acetaminophen) will be the preferred analgesic to prevent risk of bleeding  - Emphasized importance of monitoring for signs and symptoms of bleeding (abnormal bruising, prolonged bleeding, nose bleeds, bleeding from gums, discolored urine, black tarry stools)  - Advised patient to alert all providers of anticoagulation therapy prior to starting a new medication or having a procedure    Follow-up Appointments:  Pt aware of follow up appointments, already completed one.   If their condition worsens, is the pt aware to call PCP or go to the Emergency Dept.? Yes  Final Patient Assessment: Spoke with patient, he is doing well.  He has home health coming in to help him out.  Reviewed medications.

## 2021-10-04 DIAGNOSIS — I214 Non-ST elevation (NSTEMI) myocardial infarction: Secondary | ICD-10-CM | POA: Diagnosis not present

## 2021-10-04 DIAGNOSIS — I2510 Atherosclerotic heart disease of native coronary artery without angina pectoris: Secondary | ICD-10-CM | POA: Diagnosis not present

## 2021-10-04 DIAGNOSIS — I5023 Acute on chronic systolic (congestive) heart failure: Secondary | ICD-10-CM | POA: Diagnosis not present

## 2021-10-04 DIAGNOSIS — E1142 Type 2 diabetes mellitus with diabetic polyneuropathy: Secondary | ICD-10-CM | POA: Diagnosis not present

## 2021-10-06 ENCOUNTER — Encounter (HOSPITAL_BASED_OUTPATIENT_CLINIC_OR_DEPARTMENT_OTHER): Payer: Medicare Other | Attending: Physician Assistant | Admitting: Physician Assistant

## 2021-10-06 ENCOUNTER — Other Ambulatory Visit: Payer: Self-pay

## 2021-10-06 DIAGNOSIS — I480 Paroxysmal atrial fibrillation: Secondary | ICD-10-CM | POA: Insufficient documentation

## 2021-10-06 DIAGNOSIS — L97818 Non-pressure chronic ulcer of other part of right lower leg with other specified severity: Secondary | ICD-10-CM | POA: Insufficient documentation

## 2021-10-06 DIAGNOSIS — Z7901 Long term (current) use of anticoagulants: Secondary | ICD-10-CM | POA: Diagnosis not present

## 2021-10-06 DIAGNOSIS — I509 Heart failure, unspecified: Secondary | ICD-10-CM | POA: Insufficient documentation

## 2021-10-06 DIAGNOSIS — L97812 Non-pressure chronic ulcer of other part of right lower leg with fat layer exposed: Secondary | ICD-10-CM | POA: Diagnosis not present

## 2021-10-06 DIAGNOSIS — E1151 Type 2 diabetes mellitus with diabetic peripheral angiopathy without gangrene: Secondary | ICD-10-CM | POA: Insufficient documentation

## 2021-10-06 DIAGNOSIS — E11621 Type 2 diabetes mellitus with foot ulcer: Secondary | ICD-10-CM | POA: Insufficient documentation

## 2021-10-06 DIAGNOSIS — L97522 Non-pressure chronic ulcer of other part of left foot with fat layer exposed: Secondary | ICD-10-CM | POA: Insufficient documentation

## 2021-10-06 DIAGNOSIS — I87333 Chronic venous hypertension (idiopathic) with ulcer and inflammation of bilateral lower extremity: Secondary | ICD-10-CM | POA: Insufficient documentation

## 2021-10-06 NOTE — Progress Notes (Addendum)
[name], Vincent M. ([id number]) Visit Report for 10/06/2021 Chief Complaint Document Details Patient Name: Date of Service: [name] ID M. 10/06/2021 1:45 PM Medical Record Number: [id number] Patient Account Number: [id number] Date of Birth/Sex: Treating RN: 03-Nov-1935 (86 y.o. Vincent [name] Primary Care Provider: [name] Other Clinician: Referring Provider: Treating Provider/Extender: Guadalupe Maple, Karrar Weeks in Treatment: 11 Information Obtained from: Patient Chief Complaint 07/15/2021 patient is here for wounds on his bilateral lower legs Electronic Signature(s) Signed: 10/06/2021 1:58:19 PM By: Worthy Keeler PA-C Entered By: Worthy Keeler on 10/06/2021 13:58:19 -------------------------------------------------------------------------------- HPI Details Patient Name: Date of Service: [name], [name] 10/06/2021 1:45 PM Medical Record Number: [id number] Patient Account Number: [id number] Date of Birth/Sex: Treating RN: 1935-11-12 (86 y.o. Vincent [name] Primary Care Provider: [name] Other Clinician: Referring Provider: Treating Provider/Extender: Guadalupe Maple, Karrar Weeks in Treatment: 11 History of Present Illness HPI Description: ADMISSION 07/15/2021 This is an 86 year old man who came here for evaluation of wounds on his bilateral predominantly anterior lower legs mid aspect of the tibia. He seems to have been dealing with this for most of this year. Currently with 3 wounds on the right and 2 on the left. At least some of these seem to start off as blisters probably the majority. He has been following with Dr. Anabel Bene of [facility] dermatology according the patient back he has an appointment Dr. Anabel Bene tomorrow he was given a prescription for triamcinolone which she has been applying daily in fact it was renewed yesterday. Patient is a diabetic no recent hemoglobin A1c. He is on Coumadin for persistent atrial fibrillation. Vein and  vascular in December 2021 at which time he had bilateral foot pain and finger pain. ABIs bilaterally were noncompressible however the TBI on the right was only 0.39. There were biphasic waveforms on the left his ABI was noncompressible with biphasic waveforms but with a TBI of 0.6. He was not felt to have a primary vascular issue. There is a mention of Raynaud's phenomenon as well. Past medical history includes cellulitis of the right leg in June 2022, type 2 diabetes, persistent lower extremity edema, bilateral foot and ankle pain, Raynaud's phenomenon, coronary artery disease, congestive heart failure, pacemaker, none sustained ventricular tachycardia, remote CVA paroxysmal atrial fib on Coumadin. We did not reattempt ABIs in our clinic 07/22/2019 patient appears to be doing decently well in regard to his wounds as compared to last week with the wounds that were present last week. Fortunately I do not see any signs of active infection which is great news. No fevers, chills, nausea, vomiting, or diarrhea. With that being said he has several new blisters that are getting need to be addressed today. 07/28/2021 upon evaluation today patient actually appears to be making excellent progress. I am extremely pleased with where we stand today. I think that his wounds are significantly improved compared to where they were previous. 08/04/2021 upon evaluation today patient appears to be doing much better in regard to his wounds in general. I feel like across the board he is showing signs of significant improvement. Fortunately there is no evidence of infection which is great news. No fevers, chills, nausea, vomiting, or diarrhea. 08/11/2021 upon evaluation today patient actually appears to be doing quite well in regard to his wound. Has been tolerating the dressing changes without complication. Fortunately I do not see any signs of infection currently which is great news as well. No fevers, chills, nausea,  vomiting, or diarrhea. 08/18/2021 on evaluation today patient appears to be doing excellent he is making good progress and overall I am extremely pleased with where we stand today. There does not appear to be any signs of active infection at this time which is great news. 09/01/2020 upon evaluation patient's wounds on both the left and the right appear to be at 0.1 cm measurement and are very close to complete resolution. Were not quite completely done yet but this is very close. Overall I am extremely happy with where things stand and I think he is headed in the right direction. 09/08/2021 upon evaluation today patient appears to be doing well in regard to his right leg which is showing signs of being completely healed which is excellent. Fortunately I do not see any evidence of active infection locally nor systemically at this point which is also great news. No fevers, chills, nausea, vomiting, or diarrhea. 09/15/2021; this is a patient we have been following for now a small wound on the left dorsal foot. He had wounds on the right legs and was discharged to 20/30 below-knee stockings apparently in late December. He came in today with marked increase in swelling in the right leg large areas of denuded skin which probably started as blisters. He did not have a stocking on 10/06/2021 upon evaluation today patient appears to be doing decently well in regard to his wounds. On the left leg he is completely healed on the right leg he does still have a small area that is open although again its not as good as it was last time I saw him is also not as bad as it was in the beginning. He has been in the hospital and they did not wrap him as part of the issue I believe here as well. Electronic Signature(s) Signed: 10/06/2021 3:38:45 PM By: Worthy Keeler PA-C Entered By: Worthy Keeler on 10/06/2021 15:38:44 -------------------------------------------------------------------------------- Physical Exam  Details Patient Name: Date of Service: [name], [name] 10/06/2021 1:45 PM Medical Record Number: [id number] Patient Account Number: [id number] Date of Birth/Sex: Treating RN: Jan 13, 1936 (86 y.o. Vincent [name] Primary Care Provider: [name] Other Clinician: Referring Provider: Treating Provider/Extender: Guadalupe Maple, Karrar Weeks in Treatment: 40 Constitutional Well-nourished and well-hydrated in no acute distress. Respiratory normal breathing without difficulty. Psychiatric this patient is able to make decisions and demonstrates good insight into disease process. Alert and Oriented x 3. pleasant and cooperative. Notes Upon inspection patient's wound bed actually showed signs of fairly good granulation and epithelization and actually very pleased with where we stand and I think the patient is making good progress here. No fevers, chills, nausea, vomiting, or diarrhea. Electronic Signature(s) Signed: 10/06/2021 3:38:59 PM By: Worthy Keeler PA-C Entered By: Worthy Keeler on 10/06/2021 15:38:59 -------------------------------------------------------------------------------- Physician Orders Details Patient Name: Date of Service: [name], [name] 10/06/2021 1:45 PM Medical Record Number: [id number] Patient Account Number: [id number] Date of Birth/Sex: Treating RN: 1936-01-02 (86 y.o. Vincent [name] Primary Care Provider: [name] Other Clinician: Referring Provider: Treating Provider/Extender: Veatrice Bourbon in Treatment: [phone] Verbal / Phone Orders: No Diagnosis Coding ICD-10 Coding Code Description L97.818 Non-pressure chronic ulcer of other part of right lower leg with other specified severity L97.522 Non-pressure chronic ulcer of other part of left foot with fat layer exposed I87.333 Chronic venous hypertension (idiopathic) with ulcer and inflammation of bilateral lower extremity E11.51 Type 2 diabetes mellitus with diabetic  peripheral angiopathy without gangrene Follow-up Appointments ppointment in 1 week. - with Margarita Grizzle (Room 3) Return A Bathing/ Shower/ Hygiene May shower with protection but do not get wound dressing(s) wet. Edema Control - Lymphedema / SCD / Other Bilateral Lower Extremities Elevate legs to the level of the heart or above for 30 minutes daily and/or when sitting, a frequency of: - whenever sitting Avoid standing for long periods of time. Patient to wear own compression stockings every day. - Compression Stocking to right leg: apply in morning and remove at bedtime. Exercise regularly Additional Orders / Instructions Follow Nutritious Diet - Monitor blood sugars Non Wound Condition Left Lower Extremity Other Non Wound Condition Orders/Instructions: - Apply TCA/Lotion to left leg and wrap with Kerlix/Coban compression. Wound Treatment Wound #10 - Lower Leg Wound Laterality: Right, Circumferential Cleanser: Soap and Water 1 x Per Week/30 Days Discharge Instructions: May shower and wash wound with dial antibacterial soap and water prior to dressing change. Cleanser: Wound Cleanser 1 x Per Week/30 Days Discharge Instructions: Cleanse the wound with wound cleanser prior to applying a clean dressing using gauze sponges, not tissue or cotton balls. Peri-Wound Care: Triamcinolone 15 (g) 1 x Per Week/30 Days Discharge Instructions: Use triamcinolone 15 (g) as directed Peri-Wound Care: Sween Lotion (Moisturizing lotion) 1 x Per Week/30 Days Discharge Instructions: Apply moisturizing lotion as directed Prim Dressing: KerraCel Ag Gelling Fiber Dressing, 4x5 in (silver alginate) 1 x Per Week/30 Days ary Discharge Instructions: Apply silver alginate to wound bed as instructed Secondary Dressing: Woven Gauze Sponge, Non-Sterile 4x4 in 1 x Per Week/30 Days Discharge Instructions: Apply over primary dressing as directed. Secondary Dressing: ABD Pad, 8x10 1 x Per Week/30 Days Discharge Instructions: Apply  over primary dressing as directed. Compression Wrap: Kerlix Roll 4.5x3.1 (in/yd) 1 x Per Week/30 Days Discharge Instructions: Apply Kerlix and Coban compression as directed. Compression Wrap: Coban Self-Adherent Wrap 4x5 (in/yd) 1 x Per Week/30 Days Discharge Instructions: Apply over Kerlix as directed. Electronic Signature(s) Signed: 10/06/2021 4:24:10 PM By: Lorrin Jackson Signed: 10/06/2021 5:11:08 PM By: Worthy Keeler PA-C Entered By: Lorrin Jackson on 10/06/2021 14:59:55 -------------------------------------------------------------------------------- Problem List Details Patient Name: Date of Service: [name], [name] 10/06/2021 1:45 PM Medical Record Number: [id number] Patient Account Number: [id number] Date of Birth/Sex: Treating RN: [birth year] (86 y.o. Vincent [name] Primary Care Provider: [name] Other Clinician: Referring Provider: Treating Provider/Extender: Veatrice Bourbon in Treatment: 11 Active Problems ICD-10 Encounter Code Description Active Date MDM Diagnosis L97.818 Non-pressure chronic ulcer of other part of right lower leg with other specified 07/15/2021 No Yes severity L97.522 Non-pressure chronic ulcer of other part of left foot with fat layer exposed 08/04/2021 No Yes I87.333 Chronic venous hypertension (idiopathic) with ulcer and inflammation of 07/15/2021 No Yes bilateral lower extremity E11.51 Type 2 diabetes mellitus with diabetic peripheral angiopathy without gangrene 07/15/2021 No Yes Inactive Problems ICD-10 Code Description Active Date Inactive Date L97.828 Non-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 leg with other specified severity 07/15/2021 07/15/2021 Resolved Problems Electronic Signature(s) Signed: 10/06/2021 1:58:08 PM By: Worthy Keeler PA-C Entered By: Worthy Keeler on 10/06/2021 13:58:07 -------------------------------------------------------------------------------- Progress Note Details Patient  Name: Date of Service: [name], [name] 10/06/2021 1:45 PM Medical Record Number: [id number] Patient Account Number: [id number] Date of Birth/Sex: Treating RN: December 17, 1935 (86 y.o. Vincent [name] Primary Care Provider: [name] Other Clinician: Referring Provider: Treating Provider/Extender: Guadalupe Maple, Karrar Weeks in Treatment: 11 Subjective  Chief Complaint Information obtained from Patient 07/15/2021 patient is here for wounds on his bilateral lower legs History of Present Illness (HPI) ADMISSION 07/15/2021 This is an 86 year old man who came here for evaluation of wounds on his bilateral predominantly anterior lower legs mid aspect of the tibia. He seems to have been dealing with this for most of this year. Currently with 3 wounds on the right and 2 on the left. At least some of these seem to start off as blisters probably the majority. He has been following with Dr. Anabel Bene of [facility] dermatology according the patient back he has an appointment Dr. Anabel Bene tomorrow he was given a prescription for triamcinolone which she has been applying daily in fact it was renewed yesterday. Patient is a diabetic no recent hemoglobin A1c. He is on Coumadin for persistent atrial fibrillation. Vein and vascular in December 2021 at which time he had bilateral foot pain and finger pain. ABIs bilaterally were noncompressible however the TBI on the right was only 0.39. There were biphasic waveforms on the left his ABI was noncompressible with biphasic waveforms but with a TBI of 0.6. He was not felt to have a primary vascular issue. There is a mention of Raynaud's phenomenon as well. Past medical history includes cellulitis of the right leg in June 2022, type 2 diabetes, persistent lower extremity edema, bilateral foot and ankle pain, Raynaud's phenomenon, coronary artery disease, congestive heart failure, pacemaker, none sustained ventricular tachycardia, remote CVA paroxysmal atrial  fib on Coumadin. We did not reattempt ABIs in our clinic 07/22/2019 patient appears to be doing decently well in regard to his wounds as compared to last week with the wounds that were present last week. Fortunately I do not see any signs of active infection which is great news. No fevers, chills, nausea, vomiting, or diarrhea. With that being said he has several new blisters that are getting need to be addressed today. 07/28/2021 upon evaluation today patient actually appears to be making excellent progress. I am extremely pleased with where we stand today. I think that his wounds are significantly improved compared to where they were previous. 08/04/2021 upon evaluation today patient appears to be doing much better in regard to his wounds in general. I feel like across the board he is showing signs of significant improvement. Fortunately there is no evidence of infection which is great news. No fevers, chills, nausea, vomiting, or diarrhea. 08/11/2021 upon evaluation today patient actually appears to be doing quite well in regard to his wound. Has been tolerating the dressing changes without complication. Fortunately I do not see any signs of infection currently which is great news as well. No fevers, chills, nausea, vomiting, or diarrhea. 08/18/2021 on evaluation today patient appears to be doing excellent he is making good progress and overall I am extremely pleased with where we stand today. There does not appear to be any signs of active infection at this time which is great news. 09/01/2020 upon evaluation patient's wounds on both the left and the right appear to be at 0.1 cm measurement and are very close to complete resolution. Were not quite completely done yet but this is very close. Overall I am extremely happy with where things stand and I think he is headed in the right direction. 09/08/2021 upon evaluation today patient appears to be doing well in regard to his right leg which is showing  signs of being completely healed which is excellent. Fortunately I do not see any evidence of active infection locally  nor systemically at this point which is also great news. No fevers, chills, nausea, vomiting, or diarrhea. 09/15/2021; this is a patient we have been following for now a small wound on the left dorsal foot. He had wounds on the right legs and was discharged to 20/30 below-knee stockings apparently in late December. He came in today with marked increase in swelling in the right leg large areas of denuded skin which probably started as blisters. He did not have a stocking on 10/06/2021 upon evaluation today patient appears to be doing decently well in regard to his wounds. On the left leg he is completely healed on the right leg he does still have a small area that is open although again its not as good as it was last time I saw him is also not as bad as it was in the beginning. He has been in the hospital and they did not wrap him as part of the issue I believe here as well. Objective Constitutional Well-nourished and well-hydrated in no acute distress. Vitals Time Taken: 2:04 PM, Height: 69 in, Weight: 185 lbs, BMI: 27.3, Temperature: 97.5 F, Pulse: 57 bpm, Respiratory Rate: 18 breaths/min, Blood Pressure: 128/66 mmHg, Capillary Blood Glucose: 150 mg/dl. Respiratory normal breathing without difficulty. Psychiatric this patient is able to make decisions and demonstrates good insight into disease process. Alert and Oriented x 3. pleasant and cooperative. General Notes: Upon inspection patient's wound bed actually showed signs of fairly good granulation and epithelization and actually very pleased with where we stand and I think the patient is making good progress here. No fevers, chills, nausea, vomiting, or diarrhea. Integumentary (Hair, Skin) Wound #10 status is Open. Original cause of wound was Blister. The date acquired was: 09/12/2021. The wound has been in treatment 3 weeks.  The wound is located on the Right,Circumferential Lower Leg. The wound measures 3.5cm length x 6cm width x 0.1cm depth; 16.493cm^2 area and 1.649cm^3 volume. There is Fat Layer (Subcutaneous Tissue) exposed. There is no tunneling or undermining noted. There is a large amount of serous drainage noted. The wound margin is distinct with the outline attached to the wound base. There is large (67-100%) red, friable granulation within the wound bed. There is no necrotic tissue within the wound bed. Wound #9 status is Healed - Epithelialized. Original cause of wound was Gradually Appeared. The date acquired was: 08/04/2021. The wound has been in treatment 9 weeks. The wound is located on the Left,Dorsal Foot. The wound measures 0cm length x 0cm width x 0cm depth; 0cm^2 area and 0cm^3 volume. 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 - with Margarita Grizzle (Room 3) Bathing/ Shower/ Hygiene: May shower with protection but do not get wound dressing(s) wet. Edema Control - Lymphedema / SCD / Other: Elevate legs to the level of the heart or above for 30 minutes daily and/or when sitting, a frequency of: - whenever sitting Avoid standing for long periods of time. Patient to wear own compression stockings every day. - Compression Stocking to right leg: apply in morning and remove at bedtime. Exercise regularly Additional Orders / Instructions: Follow Nutritious Diet - Monitor blood sugars Non Wound Condition: Other Non Wound Condition Orders/Instructions: -  Apply TCA/Lotion to left leg and wrap with Kerlix/Coban  compression. WOUND #10: - Lower Leg Wound Laterality: Right, Circumferential Cleanser: Soap and Water 1 x Per Week/30 Days Discharge Instructions: May shower and wash wound with dial antibacterial soap and water prior to dressing change. Cleanser: Wound Cleanser 1 x Per Week/30 Days Discharge Instructions: Cleanse the wound with wound cleanser prior to applying a clean dressing using gauze sponges, not tissue or cotton balls. Peri-Wound Care: Triamcinolone 15 (g) 1 x Per Week/30 Days Discharge Instructions: Use triamcinolone 15 (g) as directed Peri-Wound Care: Sween Lotion (Moisturizing lotion) 1 x Per Week/30 Days Discharge Instructions: Apply moisturizing lotion as directed Prim Dressing: KerraCel Ag Gelling Fiber Dressing, 4x5 in (silver alginate) 1 x Per Week/30 Days ary Discharge Instructions: Apply silver alginate to wound bed as instructed Secondary Dressing: Woven Gauze Sponge, Non-Sterile 4x4 in 1 x Per Week/30 Days Discharge Instructions: Apply over primary dressing as directed. Secondary Dressing: ABD Pad, 8x10 1 x Per Week/30 Days Discharge Instructions: Apply over primary dressing as directed. Com pression Wrap: Kerlix Roll 4.5x3.1 (in/yd) 1 x Per Week/30 Days Discharge Instructions: Apply Kerlix and Coban compression as directed. Com pression Wrap: Coban Self-Adherent Wrap 4x5 (in/yd) 1 x Per Week/30 Days Discharge Instructions: Apply over Kerlix as directed. 1. Would recommend that we going to continue with the wound care measures as before and the patient is in agreement with the plan. This includes the use of the silver alginate dressing which I think is doing a great job to the wound bed at this point. In the past we did switch over to some Iodoflex but I think it is fairly clean currently Iodoflex is probably not necessary. 2. I would recommend as well that we continue with the ABD pad to cover followed by Kerlix and Coban wrap. We will see patient back for reevaluation in  1 week here in the clinic. If anything worsens or changes patient will contact our office for additional recommendations. We will bring his compression socks with him at the next visit. Electronic Signature(s) Signed: 10/06/2021 3:39:36 PM By: Worthy Keeler PA-C Entered By: Worthy Keeler on 10/06/2021 15:39:35 -------------------------------------------------------------------------------- SuperBill Details Patient Name: Date of Service: [name], [name] 10/06/2021 Medical Record Number: [id number] Patient Account Number: [id number] Date of Birth/Sex: Treating RN: [birth year] (86 y.o. Vincent [name] Primary Care Provider: [name] Other Clinician: Referring Provider: Treating Provider/Extender: Guadalupe Maple, Karrar Weeks in Treatment: 11 Diagnosis Coding ICD-10 Codes Code Description [id number] Non-pressure chronic ulcer of other part of right lower leg with other specified severity L97.522 Non-pressure chronic ulcer of other part of left foot with fat layer exposed I87.333 Chronic venous hypertension (idiopathic) with ulcer and inflammation of bilateral lower extremity E11.51 Type 2 diabetes mellitus with diabetic peripheral angiopathy without gangrene Facility Procedures CPT4 Code: [id number] Description: 99213 - WOUND CARE VISIT-LEV 3 EST PT Modifier: Quantity: 1 Physician Procedures : CPT4 Code Description Modifier [id number] - WC PHYS LEVEL 3 - EST PT ICD-10 Diagnosis Description L97.818 Non-pressure chronic ulcer of other part of right lower leg with other specified severity L97.522 Non-pressure chronic ulcer of other part of  left foot with fat layer exposed I87.333 Chronic venous hypertension (idiopathic) with ulcer and inflammation of bilateral lower extremity E11.51 Type 2 diabetes mellitus with diabetic peripheral angiopathy without gangrene Quantity: 1 Electronic Signature(s) Signed: 10/06/2021 3:39:51 PM By: Worthy Keeler PA-C Entered By: Worthy Keeler on 10/06/2021 15:39:51

## 2021-10-06 NOTE — Progress Notes (Signed)
[name], [name] ([id number]) Visit Report for 10/06/2021 Arrival Information Details Patient Name: Date of Service: [name] ID M. 10/06/2021 1:45 PM Medical Record Number: [id number] Patient Account Number: [id number] Date of Birth/Sex: Treating RN: 1935-12-07 (86 y.o. [name] Primary Care Haruo Stepanek: [name] Other Clinician: Referring Jerrick Farve: Treating Denishia Citro/Extender: Guadalupe Maple, Karrar Weeks in Treatment: 11 Visit Information History Since Last Visit Added or deleted any medications: Yes Patient Arrived: Ambulatory Any new allergies or adverse reactions: No Arrival Time: 13:58 Had a fall or experienced change in No Transfer Assistance: None activities of daily living that may affect Patient Identification Verified: Yes risk of falls: Secondary Verification Process Completed: Yes Signs or symptoms of abuse/neglect since last visito No Patient Requires Transmission-Based Precautions: No Hospitalized since last visit: Yes Patient Has Alerts: Yes Implantable device outside of the clinic excluding No cellular tissue based products placed in the center since last visit: Has Dressing in Place as Prescribed: Yes Has Compression in Place as Prescribed: Yes Pain Present Now: No Electronic Signature(s) Signed: 10/06/2021 4:24:10 PM By: Lorrin Jackson Entered By: Lorrin Jackson on 10/06/2021 14:00:33 -------------------------------------------------------------------------------- Clinic Level of Care Assessment Details Patient Name: Date of Service: [name] ID M. 10/06/2021 1:45 PM Medical Record Number: [id number] Patient Account Number: [id number] Date of Birth/Sex: Treating RN: 1935/11/22 (86 y.o. [name] Primary Care Raiza Kiesel: [name] Other Clinician: Referring Phylliss Strege: Treating Danette Weinfeld/Extender: Guadalupe Maple, Karrar Weeks in Treatment: 11 Clinic Level of Care Assessment Items TOOL 4 Quantity Score X- 1 0 Use when  only an EandM is performed on FOLLOW-UP visit ASSESSMENTS - Nursing Assessment / Reassessment X- 1 10 Reassessment of Co-morbidities (includes updates in patient status) X- 1 5 Reassessment of Adherence to Treatment Plan ASSESSMENTS - Wound and Skin A ssessment / Reassessment [] - 0 Simple Wound Assessment / Reassessment - one wound X- 2 5 Complex Wound Assessment / Reassessment - multiple wounds [] - 0 Dermatologic / Skin Assessment (not related to wound area) ASSESSMENTS - Focused Assessment X- 1 5 Circumferential Edema Measurements - multi extremities [] - 0 Nutritional Assessment / Counseling / Intervention [] - 0 Lower Extremity Assessment (monofilament, tuning fork, pulses) [] - 0 Peripheral Arterial Disease Assessment (using hand held doppler) ASSESSMENTS - Ostomy and/or Continence Assessment and Care [] - 0 Incontinence Assessment and Management [] - 0 Ostomy Care Assessment and Management (repouching, etc.) PROCESS - Coordination of Care [] - 0 Simple Patient / Family Education for ongoing care X- 1 20 Complex (extensive) Patient / Family Education for ongoing care [] - 0 Staff obtains Programmer, systems, Records, T Results / Process Orders est [] - 0 Staff telephones HHA, Nursing Homes / Clarify orders / etc [] - 0 Routine Transfer to another Facility (non-emergent condition) [] - 0 Routine Hospital Admission (non-emergent condition) [] - 0 New Admissions / Biomedical engineer / Ordering NPWT Apligraf, etc. , [] - 0 Emergency Hospital Admission (emergent condition) [] - 0 Simple Discharge Coordination [] - 0 Complex (extensive) Discharge Coordination PROCESS - Special Needs [] - 0 Pediatric / Minor Patient Management [] - 0 Isolation Patient Management [] - 0 Hearing / Language / Visual special needs [] - 0 Assessment of Community assistance (transportation, D/C planning, etc.) [] - 0 Additional assistance / Altered mentation [] - 0 Support  Surface(s) Assessment (bed, cushion, seat, etc.) INTERVENTIONS - Wound Cleansing / Measurement X - Simple Wound Cleansing - one wound 1 5 [] - 0  Complex Wound Cleansing - multiple wounds X- 1 5 Wound Imaging (photographs - any number of wounds) [] - 0 Wound Tracing (instead of photographs) X- 1 5 Simple Wound Measurement - one wound [] - 0 Complex Wound Measurement - multiple wounds INTERVENTIONS - Wound Dressings [] - 0 Small Wound Dressing one or multiple wounds [] - 0 Medium Wound Dressing one or multiple wounds X- 2 20 Large Wound Dressing one or multiple wounds [] - 0 Application of Medications - topical [] - 0 Application of Medications - injection INTERVENTIONS - Miscellaneous [] - 0 External ear exam [] - 0 Specimen Collection (cultures, biopsies, blood, body fluids, etc.) [] - 0 Specimen(s) / Culture(s) sent or taken to Lab for analysis [] - 0 Patient Transfer (multiple staff / Civil Service fast streamer / Similar devices) [] - 0 Simple Staple / Suture removal (25 or less) [] - 0 Complex Staple / Suture removal (26 or more) [] - 0 Hypo / Hyperglycemic Management (close monitor of Blood Glucose) [] - 0 Ankle / Brachial Index (ABI) - do not check if billed separately X- 1 5 Vital Signs Has the patient been seen at the hospital within the last three years: Yes Total Score: 110 Level Of Care: New/Established - Level 3 Electronic Signature(s) Signed: 10/06/2021 4:24:10 PM By: Lorrin Jackson Entered By: Lorrin Jackson on 10/06/2021 15:01:02 -------------------------------------------------------------------------------- Encounter Discharge Information Details Patient Name: Date of Service: [name], [name] 10/06/2021 1:45 PM Medical Record Number: [id number] Patient Account Number: [id number] Date of Birth/Sex: Treating RN: [birth year] (86 y.o. [name] Primary Care : [name] Other Clinician: Referring : Treating /Extender: Guadalupe Maple, Karrar Weeks in Treatment: 11 Encounter Discharge Information Items Discharge Condition: Stable Ambulatory Status: Ambulatory Discharge Destination: Home Transportation: Private Auto Schedule Follow-up Appointment: Yes Clinical Summary of Care: Provided on 10/06/2021 Form Type Recipient Paper Patient Patient Electronic Signature(s) Signed: 10/06/2021 3:15:45 PM By: Lorrin Jackson Entered By: Lorrin Jackson on 10/06/2021 15:15:44 -------------------------------------------------------------------------------- Lower Extremity Assessment Details Patient Name: Date of Service: [name] ID M. 10/06/2021 1:45 PM Medical Record Number: [id number] Patient Account Number: [id number] Date of Birth/Sex: Treating RN: 1936/01/12 (86 y.o. [name] Primary Care : [name] Other Clinician: Referring : Treating /Extender: Guadalupe Maple, Karrar Weeks in Treatment: 11 Edema Assessment Assessed: Shirlyn Goltz: Yes] Patrice Paradise: Yes] Edema: [Left: Yes] [Right: Yes] Calf Left: Right: Point of Measurement: 31 cm From Medial Instep 38.2 cm 34.2 cm Ankle Left: Right: Point of Measurement: 11 cm From Medial Instep 22 cm 22 cm Vascular Assessment Pulses: Dorsalis Pedis Palpable: [Left:No] [Right:No] Electronic Signature(s) Signed: 10/06/2021 4:24:10 PM By: Lorrin Jackson Entered By: Lorrin Jackson on 10/06/2021 14:16:01 -------------------------------------------------------------------------------- Multi-Disciplinary Care Plan Details Patient Name: Date of Service: [name], [name] 10/06/2021 1:45 PM Medical Record Number: [id number] Patient Account Number: [id number] Date of Birth/Sex: Treating RN: 08/07/1936 (86 y.o. [name] Primary Care : [name] Other Clinician: Referring : Treating /Extender: Veatrice Bourbon in Treatment: Gabbs reviewed with  physician Active Inactive Nutrition Nursing Diagnoses: Impaired glucose control: actual or potential Potential for alteratiion in Nutrition/Potential for imbalanced nutrition Goals: Patient/caregiver will maintain therapeutic glucose control Date Initiated: 07/15/2021 Target Resolution Date: 10/20/2021 Goal Status: Active Interventions: Assess HgA1c results as ordered upon admission and as needed Assess patient nutrition upon admission and as needed per policy Provide education on elevated blood sugars and impact on wound healing Treatment Activities:  Dietary management education, guidance and counseling : 07/15/2021 Giving encouragement to exercise : 07/15/2021 Patient referred to Primary Care Physician for further nutritional evaluation : 07/15/2021 Notes: 08/11/21: Glucose control ongoing 09/15/20: Glucose control continues Venous Leg Ulcer Nursing Diagnoses: Knowledge deficit related to disease process and management Potential for venous Insuffiency (use before diagnosis confirmed) Goals: Patient will maintain optimal edema control Date Initiated: 07/15/2021 Target Resolution Date: 10/20/2021 Goal Status: Active Interventions: Assess peripheral edema status every visit. Compression as ordered Provide education on venous insufficiency Treatment Activities: Therapeutic compression applied : 07/15/2021 Notes: 08/11/21: Edema control ongoing 09/15/20: Edema control continues with wraps Wound/Skin Impairment Nursing Diagnoses: Impaired tissue integrity Knowledge deficit related to ulceration/compromised skin integrity Goals: Patient/caregiver will verbalize understanding of skin care regimen Date Initiated: 07/15/2021 Target Resolution Date: 10/20/2021 Goal Status: Active Ulcer/skin breakdown will have a volume reduction of 30% by week 4 Date Initiated: 07/15/2021 Date Inactivated: 08/11/2021 Target Resolution Date: 08/12/2021 Goal Status: Met Ulcer/skin breakdown will  have a volume reduction of 50% by week 8 Date Initiated: 08/11/2021 Date Inactivated: 10/06/2021 Target Resolution Date: 09/15/2021 Goal Status: Met Ulcer/skin breakdown will have a volume reduction of 80% by week 12 Date Initiated: 10/06/2021 Target Resolution Date: 11/03/2021 Goal Status: Active Interventions: Assess patient/caregiver ability to obtain necessary supplies Assess patient/caregiver ability to perform ulcer/skin care regimen upon admission and as needed Assess ulceration(s) every visit Provide education on ulcer and skin care Treatment Activities: Skin care regimen initiated : 07/15/2021 Topical wound management initiated : 07/15/2021 Notes: 09/15/20: Wound care regimen continues, patient compliant. Electronic Signature(s) Signed: 10/06/2021 4:24:10 PM By: Lorrin Jackson Entered By: Lorrin Jackson on 10/06/2021 14:18:46 -------------------------------------------------------------------------------- Pain Assessment Details Patient Name: Date of Service: [name], [name] 10/06/2021 1:45 PM Medical Record Number: [id number] Patient Account Number: [id number] Date of Birth/Sex: Treating RN: 04/10/1936 (86 y.o. [name] Primary Care : [name] Other Clinician: Referring : Treating /Extender: Guadalupe Maple, Karrar Weeks in Treatment: 11 Active Problems Location of Pain Severity and Description of Pain Patient Has Paino No Site Locations Pain Management and Medication Current Pain Management: Electronic Signature(s) Signed: 10/06/2021 4:24:10 PM By: Lorrin Jackson Entered By: Lorrin Jackson on 10/06/2021 14:14:16 -------------------------------------------------------------------------------- Patient/Caregiver Education Details Patient Name: Date of Service: DA Burgess Estelle 2/8/2023andnbsp1:45 PM Medical Record Number: [id number] Patient Account Number: [id number] Date of Birth/Gender: Treating RN: Dec 16, 1935 (86 y.o.  [name] Primary Care Physician: [name] Other Clinician: Referring Physician: Treating Physician/Extender: Veatrice Bourbon in Treatment: 11 Education Assessment Education Provided To: Patient Education Topics Provided Elevated Blood Sugar/ Impact on Healing: Methods: Explain/Verbal, Printed Responses: State content correctly Venous: Methods: Explain/Verbal, Printed Responses: State content correctly Wound/Skin Impairment: Methods: Explain/Verbal, Printed Responses: State content correctly Electronic Signature(s) Signed: 10/06/2021 4:24:10 PM By: Lorrin Jackson Entered By: Lorrin Jackson on 10/06/2021 13:58:07 -------------------------------------------------------------------------------- Wound Assessment Details Patient Name: Date of Service: [name] ID M. 10/06/2021 1:45 PM Medical Record Number: [id number] Patient Account Number: [id number] Date of Birth/Sex: Treating RN: Jan 26, 1936 (86 y.o. [name] Primary Care : [name] Other Clinician: Referring : Treating /Extender: Guadalupe Maple, Karrar Weeks in Treatment: 11 Wound Status Wound Number: 10 Primary Venous Leg Ulcer Etiology: Wound Location: Right, Circumferential Lower Leg Wound Open Wounding Event: Blister Status: Date Acquired: 09/12/2021 Comorbid Cataracts, Glaucoma, Chronic Obstructive Pulmonary Disease Weeks Of Treatment: 3 History: (COPD), Arrhythmia, Congestive Heart Failure, Coronary Artery Clustered Wound: No Disease, Hypertension, Type II Diabetes, Gout,  Neuropathy Photos Wound Measurements Length: (cm) 3.5 Width: (cm) 6 Depth: (cm) 0.1 Area: (cm) 16.493 Volume: (cm) 1.649 % Reduction in Area: 89.4% % Reduction in Volume: 89.4% Epithelialization: Medium (34-66%) Tunneling: No Undermining: No Wound Description Classification: Full Thickness Without Exposed Support Structures Wound Margin: Distinct,  outline attached Exudate Amount: Large Exudate Type: Serous Exudate Color: amber Foul Odor After Cleansing: No Slough/Fibrino No Wound Bed Granulation Amount: Large (67-100%) Exposed Structure Granulation Quality: Red, Friable Fascia Exposed: No Necrotic Amount: None Present (0%) Fat Layer (Subcutaneous Tissue) Exposed: Yes Tendon Exposed: No Muscle Exposed: No Joint Exposed: No Bone Exposed: No Treatment Notes Wound #10 (Lower Leg) Wound Laterality: Right, Circumferential Cleanser Soap and Water Discharge Instruction: May shower and wash wound with dial antibacterial soap and water prior to dressing change. Wound Cleanser Discharge Instruction: Cleanse the wound with wound cleanser prior to applying a clean dressing using gauze sponges, not tissue or cotton balls. Peri-Wound Care Triamcinolone 15 (g) Discharge Instruction: Use triamcinolone 15 (g) as directed Sween Lotion (Moisturizing lotion) Discharge Instruction: Apply moisturizing lotion as directed Topical Primary Dressing KerraCel Ag Gelling Fiber Dressing, 4x5 in (silver alginate) Discharge Instruction: Apply silver alginate to wound bed as instructed Secondary Dressing Woven Gauze Sponge, Non-Sterile 4x4 in Discharge Instruction: Apply over primary dressing as directed. ABD Pad, 8x10 Discharge Instruction: Apply over primary dressing as directed. Secured With Compression Wrap Kerlix Roll 4.5x3.1 (in/yd) Discharge Instruction: Apply Kerlix and Coban compression as directed. Coban Self-Adherent Wrap 4x5 (in/yd) Discharge Instruction: Apply over Kerlix as directed. Compression Stockings Add-Ons Electronic Signature(s) Signed: 10/06/2021 4:24:10 PM By: Lorrin Jackson Entered By: Lorrin Jackson on 10/06/2021 14:21:57 -------------------------------------------------------------------------------- Wound Assessment Details Patient Name: Date of Service: [name] ID M. 10/06/2021 1:45 PM Medical Record Number:  [id number] Patient Account Number: [id number] Date of Birth/Sex: Treating RN: 03-25-1936 (86 y.o. [name] Primary Care : [name] Other Clinician: Referring : Treating /Extender: Guadalupe Maple, Karrar Weeks in Treatment: 11 Wound Status Wound Number: 9 Primary Diabetic Wound/Ulcer of the Lower Extremity Etiology: Wound Location: Left, Dorsal Foot Secondary Lymphedema Wounding Event: Gradually Appeared Etiology: Date Acquired: 08/04/2021 Wound Healed - Epithelialized Weeks Of Treatment: 9 Status: Clustered Wound: No Comorbid Cataracts, Glaucoma, Chronic Obstructive Pulmonary Disease History: (COPD), Arrhythmia, Congestive Heart Failure, Coronary Artery Disease, Hypertension, Type II Diabetes, Gout, Neuropathy Photos Wound Measurements Length: (cm) Width: (cm) Depth: (cm) Area: (cm) Volume: (cm) 0 % Reduction in Area: 100% 0 % Reduction in Volume: 100% 0 0 0 Wound Description Classification: Grade 2 Electronic Signature(s) Signed: 10/06/2021 4:24:10 PM By: Lorrin Jackson Entered By: Lorrin Jackson on 10/06/2021 14:55:56 -------------------------------------------------------------------------------- Vitals Details Patient Name: Date of Service: [name], [name] 10/06/2021 1:45 PM Medical Record Number: [id number] Patient Account Number: [id number] Date of Birth/Sex: Treating RN: [birth year] (86 y.o. [name] Primary Care : [name] Other Clinician: Referring : Treating /Extender: Guadalupe Maple, Karrar Weeks in Treatment: 11 Vital Signs Time Taken: 14:04 Temperature (F): 97.5 Height (in): 69 Pulse (bpm): 57 Weight (lbs): 185 Respiratory Rate (breaths/min): 18 Body Mass Index (BMI): 27.3 Blood Pressure (mmHg): 128/66 Capillary Blood Glucose (mg/dl): 150 Reference Range: 80 - 120 mg / dl Electronic Signature(s) Signed: 10/06/2021 4:24:10 PM By: Lorrin Jackson Entered By: Lorrin Jackson on 10/06/2021 14:04:42

## 2021-10-07 ENCOUNTER — Telehealth: Payer: Self-pay

## 2021-10-07 NOTE — Telephone Encounter (Signed)
Warfarin discontinued and pt started on Eliquis. Anticoagulation Episode discontinued.

## 2021-10-11 ENCOUNTER — Other Ambulatory Visit: Payer: Self-pay

## 2021-10-11 ENCOUNTER — Ambulatory Visit (INDEPENDENT_AMBULATORY_CARE_PROVIDER_SITE_OTHER): Payer: Medicare Other | Admitting: Podiatry

## 2021-10-11 ENCOUNTER — Encounter: Payer: Self-pay | Admitting: Podiatry

## 2021-10-11 DIAGNOSIS — M109 Gout, unspecified: Secondary | ICD-10-CM | POA: Insufficient documentation

## 2021-10-11 DIAGNOSIS — D6869 Other thrombophilia: Secondary | ICD-10-CM | POA: Insufficient documentation

## 2021-10-11 DIAGNOSIS — D709 Neutropenia, unspecified: Secondary | ICD-10-CM | POA: Insufficient documentation

## 2021-10-11 DIAGNOSIS — N400 Enlarged prostate without lower urinary tract symptoms: Secondary | ICD-10-CM | POA: Insufficient documentation

## 2021-10-11 DIAGNOSIS — D689 Coagulation defect, unspecified: Secondary | ICD-10-CM

## 2021-10-11 DIAGNOSIS — L97909 Non-pressure chronic ulcer of unspecified part of unspecified lower leg with unspecified severity: Secondary | ICD-10-CM | POA: Insufficient documentation

## 2021-10-11 DIAGNOSIS — B351 Tinea unguium: Secondary | ICD-10-CM

## 2021-10-11 DIAGNOSIS — M79674 Pain in right toe(s): Secondary | ICD-10-CM

## 2021-10-11 DIAGNOSIS — H35039 Hypertensive retinopathy, unspecified eye: Secondary | ICD-10-CM | POA: Insufficient documentation

## 2021-10-11 DIAGNOSIS — E1165 Type 2 diabetes mellitus with hyperglycemia: Secondary | ICD-10-CM | POA: Insufficient documentation

## 2021-10-11 DIAGNOSIS — M79675 Pain in left toe(s): Secondary | ICD-10-CM

## 2021-10-11 DIAGNOSIS — E1142 Type 2 diabetes mellitus with diabetic polyneuropathy: Secondary | ICD-10-CM | POA: Diagnosis not present

## 2021-10-11 DIAGNOSIS — I69959 Hemiplegia and hemiparesis following unspecified cerebrovascular disease affecting unspecified side: Secondary | ICD-10-CM | POA: Insufficient documentation

## 2021-10-11 NOTE — Progress Notes (Signed)
This patient returns to my office for at risk foot care.  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  This patient is unable to cut nails himself since the patient cannot reach his nails.These nails are painful walking and wearing shoes.  This patient presents for at risk foot care today.  General Appearance  Alert, conversant and in no acute stress.  Vascular  Dorsalis pedis and posterior tibial  pulses are weakly/absent palpable  bilaterally.  Capillary return is within normal limits  bilaterally. Temperature is within normal limits  bilaterally.  Neurologic  Senn-Weinstein monofilament wire test diminished   bilaterally. Muscle power within normal limits bilaterally.  Nails Thick disfigured discolored nails with subungual debris  from hallux to fifth toes bilaterally. No evidence of bacterial infection or drainage bilaterally.  Orthopedic  No limitations of motion  feet .  No crepitus or effusions noted.  No bony pathology or digital deformities noted.  Skin  normotropic skin with no porokeratosis noted bilaterally.  No signs of infections or ulcers noted.     Onychomycosis  Pain in right toe  Pain in left toe.  Consent was obtained for treatment procedures.  Debridement and grinding of long thick nails with clearing of subungual debris.  No infection or ulcer.     Return office visit   3 months       Told patient to return for periodic foot care and evaluation due to potential at risk complications.   Nefertari Rebman DPM  

## 2021-10-13 ENCOUNTER — Other Ambulatory Visit: Payer: Self-pay

## 2021-10-13 ENCOUNTER — Encounter (HOSPITAL_BASED_OUTPATIENT_CLINIC_OR_DEPARTMENT_OTHER): Payer: Medicare Other | Admitting: Internal Medicine

## 2021-10-13 DIAGNOSIS — L97818 Non-pressure chronic ulcer of other part of right lower leg with other specified severity: Secondary | ICD-10-CM | POA: Diagnosis not present

## 2021-10-13 DIAGNOSIS — I87333 Chronic venous hypertension (idiopathic) with ulcer and inflammation of bilateral lower extremity: Secondary | ICD-10-CM | POA: Diagnosis not present

## 2021-10-13 DIAGNOSIS — E11621 Type 2 diabetes mellitus with foot ulcer: Secondary | ICD-10-CM | POA: Diagnosis not present

## 2021-10-13 DIAGNOSIS — I509 Heart failure, unspecified: Secondary | ICD-10-CM | POA: Diagnosis not present

## 2021-10-13 DIAGNOSIS — E1151 Type 2 diabetes mellitus with diabetic peripheral angiopathy without gangrene: Secondary | ICD-10-CM | POA: Diagnosis not present

## 2021-10-13 DIAGNOSIS — I480 Paroxysmal atrial fibrillation: Secondary | ICD-10-CM | POA: Diagnosis not present

## 2021-10-13 DIAGNOSIS — Z7901 Long term (current) use of anticoagulants: Secondary | ICD-10-CM | POA: Diagnosis not present

## 2021-10-13 DIAGNOSIS — L97522 Non-pressure chronic ulcer of other part of left foot with fat layer exposed: Secondary | ICD-10-CM | POA: Diagnosis not present

## 2021-10-14 NOTE — Progress Notes (Signed)
Kasinger, Reino M. ([id number]) Visit Report for 10/13/2021 SuperBill Details Patient Name: Date of Service: [name] ID M. 10/13/2021 Medical Record Number: [id number] Patient Account Number: [id number] Date of Birth/Sex: Treating RN: [birth year] (86 y.o. Janyth Contes Primary Care Provider: [name] Other Clinician: Referring Provider: Treating Provider/Extender: Bryon Lions in Treatment: 12 Diagnosis Coding ICD-10 Codes Code Description [phone] Non-pressure chronic ulcer of other part of right lower leg with other specified severity L97.522 Non-pressure chronic ulcer of other part of left foot with fat layer exposed I87.333 Chronic venous hypertension (idiopathic) with ulcer and inflammation of bilateral lower extremity E11.51 Type 2 diabetes mellitus with diabetic peripheral angiopathy without gangrene Facility Procedures CPT4 Code Description Modifier Quantity [id number] - WOUND CARE VISIT-LEV 3 EST PT 1 Electronic Signature(s) Signed: 10/13/2021 4:36:32 PM By: Linton Ham MD Signed: 10/14/2021 6:28:13 PM By: Levan Hurst RN, BSN Entered By: Levan Hurst on 10/13/2021 15:12:16

## 2021-10-19 ENCOUNTER — Other Ambulatory Visit: Payer: Self-pay

## 2021-10-19 ENCOUNTER — Ambulatory Visit (HOSPITAL_COMMUNITY)
Admission: RE | Admit: 2021-10-19 | Discharge: 2021-10-19 | Disposition: A | Discharge status: Home / Self Care | Payer: Medicare Other | Source: Ambulatory Visit | Attending: Internal Medicine | Admitting: Internal Medicine

## 2021-10-19 ENCOUNTER — Other Ambulatory Visit (HOSPITAL_COMMUNITY): Payer: Self-pay

## 2021-10-19 VITALS — BP 140/62 | HR 66 | Wt 173.8 lb

## 2021-10-19 DIAGNOSIS — I4821 Permanent atrial fibrillation: Secondary | ICD-10-CM | POA: Diagnosis not present

## 2021-10-19 DIAGNOSIS — I5022 Chronic systolic (congestive) heart failure: Secondary | ICD-10-CM | POA: Diagnosis not present

## 2021-10-19 DIAGNOSIS — I639 Cerebral infarction, unspecified: Secondary | ICD-10-CM | POA: Diagnosis not present

## 2021-10-19 DIAGNOSIS — E7849 Other hyperlipidemia: Secondary | ICD-10-CM

## 2021-10-19 DIAGNOSIS — E785 Hyperlipidemia, unspecified: Secondary | ICD-10-CM

## 2021-10-19 DIAGNOSIS — E119 Type 2 diabetes mellitus without complications: Secondary | ICD-10-CM | POA: Diagnosis not present

## 2021-10-19 DIAGNOSIS — Z01812 Encounter for preprocedural laboratory examination: Secondary | ICD-10-CM

## 2021-10-19 DIAGNOSIS — I482 Chronic atrial fibrillation, unspecified: Secondary | ICD-10-CM

## 2021-10-19 DIAGNOSIS — I5023 Acute on chronic systolic (congestive) heart failure: Secondary | ICD-10-CM

## 2021-10-19 DIAGNOSIS — I10 Essential (primary) hypertension: Secondary | ICD-10-CM

## 2021-10-19 DIAGNOSIS — R9439 Abnormal result of other cardiovascular function study: Secondary | ICD-10-CM | POA: Diagnosis not present

## 2021-10-19 DIAGNOSIS — I2510 Atherosclerotic heart disease of native coronary artery without angina pectoris: Secondary | ICD-10-CM

## 2021-10-19 DIAGNOSIS — Z7901 Long term (current) use of anticoagulants: Secondary | ICD-10-CM | POA: Diagnosis not present

## 2021-10-19 DIAGNOSIS — I2583 Coronary atherosclerosis due to lipid rich plaque: Secondary | ICD-10-CM

## 2021-10-19 DIAGNOSIS — Z5181 Encounter for therapeutic drug level monitoring: Secondary | ICD-10-CM

## 2021-10-19 MED ORDER — SACUBITRIL-VALSARTAN 49-51 MG PO TABS: 1.0000 | ORAL_TABLET | Freq: Two times a day (BID) | ORAL | 5 refills | Status: DC

## 2021-10-19 MED ORDER — ATORVASTATIN CALCIUM 80 MG PO TABS: 80.0000 mg | ORAL_TABLET | Freq: Every day | ORAL | 5 refills | Status: DC

## 2021-10-19 MED ORDER — APIXABAN 5 MG PO TABS: 5.0000 mg | ORAL_TABLET | Freq: Two times a day (BID) | ORAL | 5 refills | Status: DC

## 2021-10-19 MED ORDER — DIGOXIN 125 MCG PO TABS: 0.1250 mg | ORAL_TABLET | Freq: Every day | ORAL | 5 refills | Status: DC

## 2021-10-19 MED ORDER — CARVEDILOL 3.125 MG PO TABS: 3.1250 mg | ORAL_TABLET | Freq: Two times a day (BID) | ORAL | 5 refills | Status: DC

## 2021-10-19 MED ORDER — FUROSEMIDE 20 MG PO TABS: 40.0000 mg | ORAL_TABLET | Freq: Every day | ORAL | 5 refills | Status: DC

## 2021-10-19 NOTE — Patient Instructions (Signed)
It was a pleasure seeing you today!  MEDICATIONS: -We are changing your medications today --Increase Entresto to 49/51 mg (1 tablet) twice daily. You may take 2 tablets of the 24/26 mg strength twice daily until you receive the new strength.  -Call if you have questions about your medications.   NEXT APPOINTMENT: Return to clinic in 3 weeks with Dr. Aundra Dubin.  In general, to take care of your heart failure: -Limit your fluid intake to 2 Liters (half-gallon) per day.   -Limit your salt intake to ideally 2-3 grams (2000-3000 mg) per day. -Weigh yourself daily and record, and bring that "weight diary" to your next appointment.  (Weight gain of 2-3 pounds in 1 day typically means fluid weight.) -The medications for your heart are to help your heart and help you live longer.   -Please contact us before stopping any of your heart medications.  Call the clinic at [phone] with questions or to reschedule future appointments.

## 2021-10-19 NOTE — Progress Notes (Signed)
Advanced Heart Failure Clinic Note   Referring Physician: Dr Cathlean Sauer Primary Care: Dr [name]  Primary Cardiologist: Previously Dr Meda Coffee  EP: Dr Caryl Comes HF Cardiologist: Dr. Aundra Dubin  HPI:  Referred to clinic by Dr Martinique for heart failure consultation.    Vincent [name] is a 86 year old with history of HFrEF, CAD, chronic systolic heart failure, chronic a fib, T2DM, CVA, left foot droop, and chronic wounds.   Admitted with increased shortness of breath in the setting of acute heart failure. On arrival EKG showed A fib RVR with rate in the 100s. Diuresed with IV Lasix. Echo completed with EF 25%. Had cath that showed borderline obstructive disease in the distal LCx. Patent stent in the mid RCA. No change compared to 2016. GDMT started.    Presented to [facility] on 09/29/21. Overall was feeling fine. Denied SOB/PND/Orthopnea. No bleeding issues. Appetite was ok. No fever or chills. He did not remember his weight. Reported taking all medications but not sure about the names of his medications. Noted his sister drives him to appointments.  He is followed at the [facility] for chronic lower extremity wounds.   Today he returns to HF clinic for pharmacist medication titration. At last visit with APP,  no medication changes were made because the patient was not sure what he was taking. He was referred to the AHF Clinic for follow up and GDMT titration.  Overall he is feeling well today. Brought all his medications to clinic and is taking all medications as prescribed. No dizziness or lightheadedness. No CP or palpitations. No SOB/DOE. Weight is up 4 lbs from last clinic visit. Takes Lasix 40 mg daily and has not needed any extra. No PND or orthopnea. Legs are bandaged for chronic lower extremity wounds for which he is followed by the wound center.    HF Medications: Carvedilol 3.125 mg BID Entresto 24/26 mg BID Spironolactone 25 mg daily Jardiance 25 mg daily Isosorbide mononitrate 30 mg  daily Digoxin 0.125 mg daily Lasix 40 mg daily  Has the patient been experiencing any side effects to the medications prescribed?  no  Does the patient have any problems obtaining medications due to transportation or finances?   No; BCBS Medicare. All copays affordable at this time.   Understanding of regimen: fair Understanding of indications: fair Potential of compliance: good Patient understands to avoid NSAIDs. Patient understands to avoid decongestants.    Pertinent Lab Values: 09/29/21: Serum creatinine 1.13, BUN 14, Potassium 4.1, Sodium 138  Vital Signs: Weight: 173.8 lbs (last clinic weight: 169 lbs) Blood pressure: 140/62  Heart rate: 66   Assessment/Plan: Chronic HFrEF, ICM  Echo 09/18/21 EF 25% . Back in 2015 EF 40-45% NYHA II. Volume status stable.  - Continue Lasix 40 mg daily  - Continue carvedilol 3.125 mg BID - Increase Entresto to 49/51 mg BID. Repeat BMET in 3 weeks at repeat clinic visit.  - Continue spironolactone 25 mg daily - Continue Jardiance 25 mg daily - Plan to repeat ECHO after HF meds optimized.    2. Chronic A fib -Has been in A Fib for many years (reviewed EKGs back to 2016)   -Rate controlled.  - Continue carvedilol 3.125 mg BID - Continue Eliquis 5 mg BID   3. CAD  - Diffuse coronary disease - LHC 08/2020 Had cath borderline obstructive disease in the distal LCx. Patent stent in the mid RCA. No change compared to 2016. - No chest pain  - on high  intensity statin, eliquis, imdur  Follow up 3 weeks with Dr. Rush Farmer, PharmD, BCPS, [facility], [place] [facility] Pharmacist [phone]

## 2021-10-20 ENCOUNTER — Encounter (HOSPITAL_BASED_OUTPATIENT_CLINIC_OR_DEPARTMENT_OTHER): Payer: Medicare Other | Admitting: Physician Assistant

## 2021-10-20 ENCOUNTER — Inpatient Hospital Stay (HOSPITAL_COMMUNITY): Admission: RE | Admit: 2021-10-20 | Payer: Medicare Other | Source: Ambulatory Visit

## 2021-10-20 DIAGNOSIS — I480 Paroxysmal atrial fibrillation: Secondary | ICD-10-CM | POA: Diagnosis not present

## 2021-10-20 DIAGNOSIS — E1151 Type 2 diabetes mellitus with diabetic peripheral angiopathy without gangrene: Secondary | ICD-10-CM | POA: Diagnosis not present

## 2021-10-20 DIAGNOSIS — L97818 Non-pressure chronic ulcer of other part of right lower leg with other specified severity: Secondary | ICD-10-CM | POA: Diagnosis not present

## 2021-10-20 DIAGNOSIS — L97812 Non-pressure chronic ulcer of other part of right lower leg with fat layer exposed: Secondary | ICD-10-CM | POA: Diagnosis not present

## 2021-10-20 DIAGNOSIS — I87333 Chronic venous hypertension (idiopathic) with ulcer and inflammation of bilateral lower extremity: Secondary | ICD-10-CM | POA: Diagnosis not present

## 2021-10-20 DIAGNOSIS — I5023 Acute on chronic systolic (congestive) heart failure: Secondary | ICD-10-CM | POA: Diagnosis not present

## 2021-10-20 DIAGNOSIS — E1165 Type 2 diabetes mellitus with hyperglycemia: Secondary | ICD-10-CM | POA: Diagnosis not present

## 2021-10-20 DIAGNOSIS — E785 Hyperlipidemia, unspecified: Secondary | ICD-10-CM | POA: Diagnosis not present

## 2021-10-20 DIAGNOSIS — I10 Essential (primary) hypertension: Secondary | ICD-10-CM | POA: Diagnosis not present

## 2021-10-20 DIAGNOSIS — E11621 Type 2 diabetes mellitus with foot ulcer: Secondary | ICD-10-CM | POA: Diagnosis not present

## 2021-10-20 DIAGNOSIS — Z7901 Long term (current) use of anticoagulants: Secondary | ICD-10-CM | POA: Diagnosis not present

## 2021-10-20 DIAGNOSIS — I872 Venous insufficiency (chronic) (peripheral): Secondary | ICD-10-CM | POA: Diagnosis not present

## 2021-10-20 DIAGNOSIS — I509 Heart failure, unspecified: Secondary | ICD-10-CM | POA: Diagnosis not present

## 2021-10-20 DIAGNOSIS — L97522 Non-pressure chronic ulcer of other part of left foot with fat layer exposed: Secondary | ICD-10-CM | POA: Diagnosis not present

## 2021-10-20 NOTE — Progress Notes (Addendum)
Vincent [name], Vincent M. ([id number]) Visit Report for 10/20/2021 Chief Complaint Document Details Patient Name: Date of Service: Vincent [name] ID M. 10/20/2021 2:00 PM Medical Record Number: [id number] Patient Account Number: [id number] Date of Birth/Sex: Treating RN: 08-26-1936 (86 y.o. M) Primary Care Provider: [name] Other Clinician: Referring Provider: Treating Provider/Extender: Guadalupe Maple, Karrar Weeks in Treatment: 13 Information Obtained from: Patient Chief Complaint 07/15/2021 patient is here for wounds on his bilateral lower legs Electronic Signature(s) Signed: 10/20/2021 2:16:37 PM By: Worthy Keeler PA-C Entered By: Worthy Keeler on 10/20/2021 14:16:37 -------------------------------------------------------------------------------- Debridement Details Patient Name: Date of Service: Vincent [name], Vincent V ID M. 10/20/2021 2:00 PM Medical Record Number: [id number] Patient Account Number: [id number] Date of Birth/Sex: Treating RN: [birth year] (86 y.o. [name], [name] Primary Care Provider: [name] Other Clinician: Referring Provider: Treating Provider/Extender: Guadalupe Maple, Karrar Weeks in Treatment: 13 Debridement Performed for Assessment: Wound #10 Right,Circumferential Lower Leg Performed By: Physician Worthy Keeler, PA Debridement Type: Debridement Severity of Tissue Pre Debridement: Fat layer exposed Level of Consciousness (Pre-procedure): Awake and Alert Pre-procedure Verification/Time Out Yes - 14:50 Taken: Start Time: 14:51 Pain Control: Other : benzocaine 20% T Area Debrided (L x W): otal 1 (cm) x 1.7 (cm) = 1.7 (cm) Tissue and other material debrided: Viable, Non-Viable, Slough, Skin: Dermis , Skin: Epidermis, Fibrin/Exudate, Slough Level: Skin/Epidermis Debridement Description: Selective/Open Wound Instrument: Curette Bleeding: Minimum Hemostasis Achieved: Pressure End Time: 14:56 Procedural Pain: 0 Post Procedural Pain:  0 Response to Treatment: Procedure was tolerated well Level of Consciousness (Post- Awake and Alert procedure): Post Debridement Measurements of Total Wound Length: (cm) 1 Width: (cm) 1.7 Depth: (cm) 0.1 Volume: (cm) 0.134 Character of Wound/Ulcer Post Debridement: Improved Severity of Tissue Post Debridement: Fat layer exposed Post Procedure Diagnosis Same as Pre-procedure Electronic Signature(s) Signed: 10/20/2021 4:57:32 PM By: Worthy Keeler PA-C Signed: 10/20/2021 6:06:00 PM By: Deon Pilling RN, BSN Entered By: Deon Pilling on 10/20/2021 14:59:10 -------------------------------------------------------------------------------- HPI Details Patient Name: Date of Service: Vincent [name], Vincent V ID M. 10/20/2021 2:00 PM Medical Record Number: [id number] Patient Account Number: [id number] Date of Birth/Sex: Treating RN: 06-Nov-1935 (86 y.o. M) Primary Care Provider: [name] Other Clinician: Referring Provider: Treating Provider/Extender: Guadalupe Maple, Karrar Weeks in Treatment: 13 History of Present Illness HPI Description: ADMISSION 07/15/2021 This is an 86 year old man who came here for evaluation of wounds on his bilateral predominantly anterior lower legs mid aspect of the tibia. He seems to have been dealing with this for most of this year. Currently with 3 wounds on the right and 2 on the left. At least some of these seem to start off as blisters probably the majority. He has been following with Dr. Anabel Bene of [facility] dermatology according the patient back he has an appointment Dr. Anabel Bene tomorrow he was given a prescription for triamcinolone which she has been applying daily in fact it was renewed yesterday. Patient is a diabetic no recent hemoglobin A1c. He is on Coumadin for persistent atrial fibrillation. Vein and vascular in December 2021 at which time he had bilateral foot pain and finger pain. ABIs bilaterally were noncompressible however the TBI on the  right was only 0.39. There were biphasic waveforms on the left his ABI was noncompressible with biphasic waveforms but with a TBI of 0.6. He was not felt to have a primary vascular issue. There is a mention of Raynaud's phenomenon as well. Past medical history includes cellulitis of the right leg in  June 2022, type 2 diabetes, persistent lower extremity edema, bilateral foot and ankle pain, Raynaud's phenomenon, coronary artery disease, congestive heart failure, pacemaker, none sustained ventricular tachycardia, remote CVA paroxysmal atrial fib on Coumadin. We did not reattempt ABIs in our clinic 07/22/2019 patient appears to be doing decently well in regard to his wounds as compared to last week with the wounds that were present last week. Fortunately I do not see any signs of active infection which is great news. No fevers, chills, nausea, vomiting, or diarrhea. With that being said he has several new blisters that are getting need to be addressed today. 07/28/2021 upon evaluation today patient actually appears to be making excellent progress. I am extremely pleased with where we stand today. I think that his wounds are significantly improved compared to where they were previous. 08/04/2021 upon evaluation today patient appears to be doing much better in regard to his wounds in general. I feel like across the board he is showing signs of significant improvement. Fortunately there is no evidence of infection which is great news. No fevers, chills, nausea, vomiting, or diarrhea. 08/11/2021 upon evaluation today patient actually appears to be doing quite well in regard to his wound. Has been tolerating the dressing changes without complication. Fortunately I do not see any signs of infection currently which is great news as well. No fevers, chills, nausea, vomiting, or diarrhea. 08/18/2021 on evaluation today patient appears to be doing excellent he is making good progress and overall I am extremely  pleased with where we stand today. There does not appear to be any signs of active infection at this time which is great news. 09/01/2020 upon evaluation patient's wounds on both the left and the right appear to be at 0.1 cm measurement and are very close to complete resolution. Were not quite completely done yet but this is very close. Overall I am extremely happy with where things stand and I think he is headed in the right direction. 09/08/2021 upon evaluation today patient appears to be doing well in regard to his right leg which is showing signs of being completely healed which is excellent. Fortunately I do not see any evidence of active infection locally nor systemically at this point which is also great news. No fevers, chills, nausea, vomiting, or diarrhea. 09/15/2021; this is a patient we have been following for now a small wound on the left dorsal foot. He had wounds on the right legs and was discharged to 20/30 below-knee stockings apparently in late December. He came in today with marked increase in swelling in the right leg large areas of denuded skin which probably started as blisters. He did not have a stocking on 10/06/2021 upon evaluation today patient appears to be doing decently well in regard to his wounds. On the left leg he is completely healed on the right leg he does still have a small area that is open although again its not as good as it was last time I saw him is also not as bad as it was in the beginning. He has been in the hospital and they did not wrap him as part of the issue I believe here as well. 10/20/2021 upon evaluation today patient appears to be doing well with regard to his leg ulcer. He has been tolerating the dressing changes without complication. Fortunately I do not see any signs of active infection locally or systemically at this point. No fevers, chills, nausea, vomiting, or diarrhea. Electronic Signature(s) Signed: 10/20/2021 3:01:16 PM  By: Worthy Keeler  PA-C Entered By: Worthy Keeler on 10/20/2021 15:01:16 -------------------------------------------------------------------------------- Physical Exam Details Patient Name: Date of Service: Vincent [name], Vincent V ID M. 10/20/2021 2:00 PM Medical Record Number: [id number] Patient Account Number: [id number] Date of Birth/Sex: Treating RN: 03/19/1936 (86 y.o. M) Primary Care Provider: [name] Other Clinician: Referring Provider: Treating Provider/Extender: Guadalupe Maple, Karrar Weeks in Treatment: 53 Constitutional Well-nourished and well-hydrated in no acute distress. Respiratory normal breathing without difficulty. Psychiatric this patient is able to make decisions and demonstrates good insight into disease process. Alert and Oriented x 3. pleasant and cooperative. Notes Upon inspection patient's wound bed actually showed signs of good granulation and epithelization at this point. Fortunately I do not see any evidence of significant necrotic tissue he did have some dry dead tissue on the surface of the wound I was able to carefully debride this away tolerated that without complication postdebridement this appears to be doing much better. Electronic Signature(s) Signed: 10/20/2021 3:01:36 PM By: Worthy Keeler PA-C Entered By: Worthy Keeler on 10/20/2021 15:01:35 -------------------------------------------------------------------------------- Physician Orders Details Patient Name: Date of Service: Vincent [name], Vincent V ID M. 10/20/2021 2:00 PM Medical Record Number: [id number] Patient Account Number: [id number] Date of Birth/Sex: Treating RN: Apr 29, 1936 (86 y.o. [name] Primary Care Provider: [name] Other Clinician: Referring Provider: Treating Provider/Extender: Veatrice Bourbon in Treatment: [phone] Verbal / Phone Orders: No Diagnosis Coding ICD-10 Coding Code Description L97.818 Non-pressure chronic ulcer of other part of right lower leg with other  specified severity L97.522 Non-pressure chronic ulcer of other part of left foot with fat layer exposed I87.333 Chronic venous hypertension (idiopathic) with ulcer and inflammation of bilateral lower extremity E11.51 Type 2 diabetes mellitus with diabetic peripheral angiopathy without gangrene Follow-up Appointments ppointment in 1 week. - with [place] [facility] Return A Bathing/ Shower/ Hygiene May shower with protection but do not get wound dressing(s) wet. Edema Control - Lymphedema / SCD / Other Bilateral Lower Extremities Elevate legs to the level of the heart or above for 30 minutes daily and/or when sitting, a frequency of: - whenever sitting Avoid standing for long periods of time. Patient to wear own compression stockings every day. Exercise regularly Moisturize legs daily. - Left leg every night before bed. Compression stocking or Garment 20-30 mm/Hg pressure to: - apply to left leg in the morning and remove at night. Additional Orders / Instructions Follow Nutritious Diet - Monitor blood sugars Wound Treatment Wound #10 - Lower Leg Wound Laterality: Right, Circumferential Cleanser: Soap and Water 1 x Per Week/30 Days Discharge Instructions: May shower and wash wound with dial antibacterial soap and water prior to dressing change. Cleanser: Wound Cleanser 1 x Per Week/30 Days Discharge Instructions: Cleanse the wound with wound cleanser prior to applying a clean dressing using gauze sponges, not tissue or cotton balls. Peri-Wound Care: Triamcinolone 15 (g) 1 x Per Week/30 Days Discharge Instructions: Use triamcinolone 15 (g) as directed Peri-Wound Care: Sween Lotion (Moisturizing lotion) 1 x Per Week/30 Days Discharge Instructions: Apply moisturizing lotion as directed Prim Dressing: KerraCel Ag Gelling Fiber Dressing, 4x5 in (silver alginate) 1 x Per Week/30 Days ary Discharge Instructions: Apply silver alginate to wound bed as instructed Secondary Dressing: Woven Gauze Sponge,  Non-Sterile 4x4 in 1 x Per Week/30 Days Discharge Instructions: Apply over primary dressing as directed. Secondary Dressing: ABD Pad, 8x10 1 x Per Week/30 Days Discharge Instructions: Apply over primary dressing as directed. Compression Wrap: Kerlix Roll  4.5x3.1 (in/yd) 1 x Per Week/30 Days Discharge Instructions: Apply Kerlix and Coban compression as directed. Compression Wrap: Coban Self-Adherent Wrap 4x5 (in/yd) 1 x Per Week/30 Days Discharge Instructions: Apply over Kerlix as directed. Electronic Signature(s) Signed: 10/20/2021 4:57:32 PM By: Worthy Keeler PA-C Signed: 10/20/2021 6:06:00 PM By: Deon Pilling RN, BSN Entered By: Deon Pilling on 10/20/2021 14:57:36 -------------------------------------------------------------------------------- Problem List Details Patient Name: Date of Service: Vincent [name], Vincent V ID M. 10/20/2021 2:00 PM Medical Record Number: [id number] Patient Account Number: [id number] Date of Birth/Sex: Treating RN: [birth year] (86 y.o. M) Primary Care Provider: [name] Other Clinician: Referring Provider: Treating Provider/Extender: Veatrice Bourbon in Treatment: 13 Active Problems ICD-10 Encounter Code Description Active Date MDM Diagnosis L97.818 Non-pressure chronic ulcer of other part of right lower leg with other specified 07/15/2021 No Yes severity L97.522 Non-pressure chronic ulcer of other part of left foot with fat layer exposed 08/04/2021 No Yes I87.333 Chronic venous hypertension (idiopathic) with ulcer and inflammation of 07/15/2021 No Yes bilateral lower extremity E11.51 Type 2 diabetes mellitus with diabetic peripheral angiopathy without gangrene 07/15/2021 No Yes Inactive Problems ICD-10 Code Description Active Date Inactive Date L97.828 Non-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 leg with other specified severity 07/15/2021 07/15/2021 Resolved Problems Electronic Signature(s) Signed: 10/20/2021 4:57:32 PM  By: Worthy Keeler PA-C Signed: 10/20/2021 6:06:00 PM By: Deon Pilling RN, BSN Previous Signature: 10/20/2021 2:16:22 PM Version By: Worthy Keeler PA-C Entered By: Deon Pilling on 10/20/2021 14:53:56 -------------------------------------------------------------------------------- Progress Note Details Patient Name: Date of Service: Vincent [name], Vincent V ID M. 10/20/2021 2:00 PM Medical Record Number: [id number] Patient Account Number: [id number] Date of Birth/Sex: Treating RN: 1935-09-16 (86 y.o. M) Primary Care Provider: [name] Other Clinician: Referring Provider: Treating Provider/Extender: Veatrice Bourbon in Treatment: 13 Subjective Chief Complaint Information obtained from Patient 07/15/2021 patient is here for wounds on his bilateral lower legs History of Present Illness (HPI) ADMISSION 07/15/2021 This is an 86 year old man who came here for evaluation of wounds on his bilateral predominantly anterior lower legs mid aspect of the tibia. He seems to have been dealing with this for most of this year. Currently with 3 wounds on the right and 2 on the left. At least some of these seem to start off as blisters probably the majority. He has been following with Dr. Anabel Bene of [facility] dermatology according the patient back he has an appointment Dr. Anabel Bene tomorrow he was given a prescription for triamcinolone which she has been applying daily in fact it was renewed yesterday. Patient is a diabetic no recent hemoglobin A1c. He is on Coumadin for persistent atrial fibrillation. Vein and vascular in December 2021 at which time he had bilateral foot pain and finger pain. ABIs bilaterally were noncompressible however the TBI on the right was only 0.39. There were biphasic waveforms on the left his ABI was noncompressible with biphasic waveforms but with a TBI of 0.6. He was not felt to have a primary vascular issue. There is a mention of Raynaud's phenomenon as  well. Past medical history includes cellulitis of the right leg in June 2022, type 2 diabetes, persistent lower extremity edema, bilateral foot and ankle pain, Raynaud's phenomenon, coronary artery disease, congestive heart failure, pacemaker, none sustained ventricular tachycardia, remote CVA paroxysmal atrial fib on Coumadin. We did not reattempt ABIs in our clinic 07/22/2019 patient appears to be doing decently well in regard to his wounds as compared to last week with the  wounds that were present last week. Fortunately I do not see any signs of active infection which is great news. No fevers, chills, nausea, vomiting, or diarrhea. With that being said he has several new blisters that are getting need to be addressed today. 07/28/2021 upon evaluation today patient actually appears to be making excellent progress. I am extremely pleased with where we stand today. I think that his wounds are significantly improved compared to where they were previous. 08/04/2021 upon evaluation today patient appears to be doing much better in regard to his wounds in general. I feel like across the board he is showing signs of significant improvement. Fortunately there is no evidence of infection which is great news. No fevers, chills, nausea, vomiting, or diarrhea. 08/11/2021 upon evaluation today patient actually appears to be doing quite well in regard to his wound. Has been tolerating the dressing changes without complication. Fortunately I do not see any signs of infection currently which is great news as well. No fevers, chills, nausea, vomiting, or diarrhea. 08/18/2021 on evaluation today patient appears to be doing excellent he is making good progress and overall I am extremely pleased with where we stand today. There does not appear to be any signs of active infection at this time which is great news. 09/01/2020 upon evaluation patient's wounds on both the left and the right appear to be at 0.1 cm measurement  and are very close to complete resolution. Were not quite completely done yet but this is very close. Overall I am extremely happy with where things stand and I think he is headed in the right direction. 09/08/2021 upon evaluation today patient appears to be doing well in regard to his right leg which is showing signs of being completely healed which is excellent. Fortunately I do not see any evidence of active infection locally nor systemically at this point which is also great news. No fevers, chills, nausea, vomiting, or diarrhea. 09/15/2021; this is a patient we have been following for now a small wound on the left dorsal foot. He had wounds on the right legs and was discharged to 20/30 below-knee stockings apparently in late December. He came in today with marked increase in swelling in the right leg large areas of denuded skin which probably started as blisters. He did not have a stocking on 10/06/2021 upon evaluation today patient appears to be doing decently well in regard to his wounds. On the left leg he is completely healed on the right leg he does still have a small area that is open although again its not as good as it was last time I saw him is also not as bad as it was in the beginning. He has been in the hospital and they did not wrap him as part of the issue I believe here as well. 10/20/2021 upon evaluation today patient appears to be doing well with regard to his leg ulcer. He has been tolerating the dressing changes without complication. Fortunately I do not see any signs of active infection locally or systemically at this point. No fevers, chills, nausea, vomiting, or diarrhea. Objective Constitutional Well-nourished and well-hydrated in no acute distress. Vitals Time Taken: 1:58 PM, Height: 69 in, Weight: 185 lbs, BMI: 27.3, Temperature: 97.6 F, Pulse: 54 bpm, Respiratory Rate: 20 breaths/min, Blood Pressure: 117/56 mmHg, Capillary Blood Glucose: 120 mg/dl. Respiratory normal  breathing without difficulty. Psychiatric this patient is able to make decisions and demonstrates good insight into disease process. Alert and Oriented x 3. pleasant  and cooperative. General Notes: Upon inspection patient's wound bed actually showed signs of good granulation and epithelization at this point. Fortunately I do not see any evidence of significant necrotic tissue he did have some dry dead tissue on the surface of the wound I was able to carefully debride this away tolerated that without complication postdebridement this appears to be doing much better. Integumentary (Hair, Skin) Wound #10 status is Open. Original cause of wound was Blister. The date acquired was: 09/12/2021. The wound has been in treatment 5 weeks. The wound is located on the Right,Circumferential Lower Leg. The wound measures 1cm length x 1.7cm width x 0.1cm depth; 1.335cm^2 area and 0.134cm^3 volume. There is Fat Layer (Subcutaneous Tissue) exposed. There is no tunneling or undermining noted. There is a large amount of serous drainage noted. There is small (1- 33%) red granulation within the wound bed. There is a large (67-100%) amount of necrotic tissue within the wound bed including Eschar. 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 There was a Selective/Open Wound Skin/Epidermis Debridement with a total area of 1.7 sq cm performed by Worthy Keeler, PA. With the following instrument(s): Curette to  remove Viable and Non-Viable tissue/material. Material removed includes Slough, Skin: Dermis, Skin: Epidermis, and Fibrin/Exudate after achieving pain control using Other (benzocaine 20%). A time out was conducted at 14:50, prior to the start of the procedure. A Minimum amount of bleeding was controlled with Pressure. The procedure was tolerated well with a pain level of 0 throughout and a pain level of 0 following the procedure. Post Debridement Measurements: 1cm length x 1.7cm width x 0.1cm depth; 0.134cm^3 volume. Character of Wound/Ulcer Post Debridement is improved. Severity of Tissue Post Debridement is: Fat layer exposed. Post procedure Diagnosis Wound #10: Same as Pre-Procedure Plan Follow-up Appointments: Return Appointment in 1 week. - with [place] [facility] Bathing/ Shower/ Hygiene: May shower with protection but do not get wound dressing(s) wet. Edema Control - Lymphedema / SCD / Other: Elevate legs to the level of the heart or above for 30 minutes daily and/or when sitting, a frequency of: - whenever sitting Avoid standing for long periods of time. Patient to wear own compression stockings every day. Exercise regularly Moisturize legs daily. - Left leg every night before bed. Compression stocking or Garment 20-30 mm/Hg pressure to: - apply to left leg in the morning and remove at night. Additional Orders / Instructions: Follow Nutritious Diet - Monitor blood sugars WOUND #10: - Lower Leg Wound Laterality: Right, Circumferential Cleanser: Soap and Water 1 x Per Week/30 Days Discharge Instructions: May shower and wash wound with dial antibacterial soap and water prior to dressing change. Cleanser: Wound Cleanser 1 x Per Week/30 Days Discharge Instructions: Cleanse the wound with wound cleanser prior to applying a clean dressing using gauze sponges, not tissue or cotton balls. Peri-Wound Care: Triamcinolone 15 (g) 1 x Per Week/30 Days Discharge Instructions: Use triamcinolone 15 (g) as  directed Peri-Wound Care: Sween Lotion (Moisturizing lotion) 1 x Per Week/30 Days Discharge Instructions: Apply moisturizing lotion as directed Prim Dressing: KerraCel Ag  Gelling Fiber Dressing, 4x5 in (silver alginate) 1 x Per Week/30 Days ary Discharge Instructions: Apply silver alginate to wound bed as instructed Secondary Dressing: Woven Gauze Sponge, Non-Sterile 4x4 in 1 x Per Week/30 Days Discharge Instructions: Apply over primary dressing as directed. Secondary Dressing: ABD Pad, 8x10 1 x Per Week/30 Days Discharge Instructions: Apply over primary dressing as directed. Com pression Wrap: Kerlix Roll 4.5x3.1 (in/yd) 1 x Per Week/30 Days Discharge Instructions: Apply Kerlix and Coban compression as directed. Com pression Wrap: Coban Self-Adherent Wrap 4x5 (in/yd) 1 x Per Week/30 Days Discharge Instructions: Apply over Kerlix as directed. 1. Would recommend currently that we going continue with wound care measures as before and the patient is in agreement the plan. This includes the use of the silver alginate dressing which I think is doing well. 2. We will also continue with the compression wrap on the right leg. This is doing a good job with using Kerlix and Coban. 3. I would also suggest the patient continue to utilize a compression sock on the left he does have those with him today in the 20 to 30 mmHg range I would have him start using that today. We will see patient back for reevaluation in 1 week here in the clinic. If anything worsens or changes patient will contact our office for additional recommendations. Electronic Signature(s) Signed: 10/20/2021 3:02:14 PM By: Worthy Keeler PA-C Entered By: Worthy Keeler on 10/20/2021 15:02:13 -------------------------------------------------------------------------------- SuperBill Details Patient Name: Date of Service: Vincent [name], Vincent V ID M. 10/20/2021 Medical Record Number: [id number] Patient Account Number: [id number] Date of  Birth/Sex: Treating RN: 12/24/1935 (86 y.o. 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 I87.333 Chronic venous hypertension (idiopathic) with ulcer and inflammation of bilateral lower extremity E11.51 Type 2 diabetes mellitus with diabetic peripheral angiopathy without gangrene Facility Procedures CPT4 Code: [id number] Description: [phone] - DEBRIDE WOUND 1ST 20 SQ CM OR < ICD-10 Diagnosis Description L97.818 Non-pressure chronic ulcer of other part of right lower leg with other specified s Modifier: everity Quantity: 1 Physician Procedures : CPT4 Code Description Modifier [id number] - WC PHYS DEBR WO ANESTH 20 SQ CM ICD-10 Diagnosis Description L97.818 Non-pressure chronic ulcer of other part of right lower leg with other specified severity Quantity: 1 Electronic Signature(s) Signed: 10/20/2021 3:02:45 PM By: Worthy Keeler PA-C Entered By: Worthy Keeler on 10/20/2021 15:02:44

## 2021-10-25 NOTE — Progress Notes (Signed)
Bucklew, Burl M. ([id number]) Visit Report for 10/20/2021 Arrival Information Details Patient Name: Date of Service: [name] ID M. 10/20/2021 2:00 PM Medical Record Number: [id number] Patient Account Number: [id number] Date of Birth/Sex: Treating RN: November 19, 1935 (86 y.o. [name] Primary Care Taiwana Willison: [name] Other Clinician: Referring Shena Vinluan: Treating Stein Windhorst/Extender: Guadalupe Maple, Karrar Weeks in Treatment: 13 Visit Information History Since Last Visit Added or deleted any medications: No Patient Arrived: Ambulatory Any new allergies or adverse reactions: No Arrival Time: 13:57 Had a fall or experienced change in No Transfer Assistance: None activities of daily living that may affect Patient Identification Verified: Yes risk of falls: Secondary Verification Process Completed: Yes Signs or symptoms of abuse/neglect since last visito No Patient Requires Transmission-Based Precautions: No Hospitalized since last visit: No Patient Has Alerts: Yes Implantable device outside of the clinic excluding No cellular tissue based products placed in the center since last visit: Has Dressing in Place as Prescribed: Yes Has Compression in Place as Prescribed: Yes Pain Present Now: No Electronic Signature(s) Signed: 10/25/2021 11:32:19 AM By: Sharyn Creamer RN, BSN Entered By: Sharyn Creamer on 10/20/2021 13:57:55 -------------------------------------------------------------------------------- Lower Extremity Assessment Details Patient Name: Date of Service: [name], [name] 10/20/2021 2:00 PM Medical Record Number: [id number] Patient Account Number: [id number] Date of Birth/Sex: Treating RN: [birth year] (86 y.o. [name] Primary Care Zakaria Sedor: [name] Other Clinician: Referring Gustavia Carie: Treating Delano Scardino/Extender: Guadalupe Maple, Karrar Weeks in Treatment: 13 Edema Assessment Assessed: [Left: No] Patrice Paradise: No] Edema: [Left: Yes]  [Right: Yes] Calf Left: Right: Point of Measurement: 31 cm From Medial Instep 34 cm 33.5 cm Ankle Left: Right: Point of Measurement: 11 cm From Medial Instep 22 cm 23 cm Vascular Assessment Pulses: Dorsalis Pedis Palpable: [Left:Yes] [Right:Yes] Electronic Signature(s) Signed: 10/25/2021 11:32:19 AM By: Sharyn Creamer RN, BSN Entered By: Sharyn Creamer on 10/20/2021 14:09:13 -------------------------------------------------------------------------------- Multi-Disciplinary Care Plan Details Patient Name: Date of Service: [name], [name] 10/20/2021 2:00 PM Medical Record Number: [id number] Patient Account Number: [id number] Date of Birth/Sex: Treating RN: 1936-02-19 (86 y.o. [name] Primary Care Jakayden Cancio: [name] Other Clinician: Referring Stevee Valenta: Treating Areil Ottey/Extender: Veatrice Bourbon in Treatment: [place] reviewed with physician Active Inactive Nutrition Nursing Diagnoses: Impaired glucose control: actual or potential Potential for alteratiion in Nutrition/Potential for imbalanced nutrition Goals: Patient/caregiver will maintain therapeutic glucose control Date Initiated: 07/15/2021 Target Resolution Date: 10/29/2021 Goal Status: Active Interventions: Assess HgA1c results as ordered upon admission and as needed Assess patient nutrition upon admission and as needed per policy Provide education on elevated blood sugars and impact on wound healing Treatment Activities: Dietary management education, guidance and counseling : 07/15/2021 Giving encouragement to exercise : 07/15/2021 Patient referred to Primary Care Physician for further nutritional evaluation : 07/15/2021 Notes: 08/11/21: Glucose control ongoing 09/15/20: Glucose control continues Venous Leg Ulcer Nursing Diagnoses: Knowledge deficit related to disease process and management Potential for venous Insuffiency (use before diagnosis  confirmed) Goals: Patient will maintain optimal edema control Date Initiated: 07/15/2021 Target Resolution Date: 10/29/2021 Goal Status: Active Interventions: Assess peripheral edema status every visit. Compression as ordered Provide education on venous insufficiency Treatment Activities: Therapeutic compression applied : 07/15/2021 Notes: 08/11/21: Edema control ongoing 09/15/20: Edema control continues with wraps Wound/Skin Impairment Nursing Diagnoses: Impaired tissue integrity Knowledge deficit related to ulceration/compromised skin integrity Goals: Patient/caregiver will verbalize understanding of skin care regimen Date Initiated: 07/15/2021 Target Resolution Date: 10/20/2021 Goal Status: Active Ulcer/skin breakdown  will have a volume reduction of 30% by week 4 Date Initiated: 07/15/2021 Date Inactivated: 08/11/2021 Target Resolution Date: 08/12/2021 Goal Status: Met Ulcer/skin breakdown will have a volume reduction of 50% by week 8 Date Initiated: 08/11/2021 Date Inactivated: 10/06/2021 Target Resolution Date: 09/15/2021 Goal Status: Met Ulcer/skin breakdown will have a volume reduction of 80% by week 12 Date Initiated: 10/06/2021 Target Resolution Date: 11/03/2021 Goal Status: Active Interventions: Assess patient/caregiver ability to obtain necessary supplies Assess patient/caregiver ability to perform ulcer/skin care regimen upon admission and as needed Assess ulceration(s) every visit Provide education on ulcer and skin care Treatment Activities: Skin care regimen initiated : 07/15/2021 Topical wound management initiated : 07/15/2021 Notes: 09/15/20: Wound care regimen continues, patient compliant. Electronic Signature(s) Signed: 10/20/2021 6:06:00 PM By: Deon Pilling RN, BSN Entered By: Deon Pilling on 10/20/2021 14:54:58 -------------------------------------------------------------------------------- Pain Assessment Details Patient Name: Date of Service: [name],  [name] 10/20/2021 2:00 PM Medical Record Number: [id number] Patient Account Number: [id number] Date of Birth/Sex: Treating RN: 01-27-1936 (86 y.o. [name] Primary Care Alison Kubicki: [name] Other Clinician: Referring Lon Klippel: Treating Mithcell Schumpert/Extender: Guadalupe Maple, Karrar Weeks in Treatment: 13 Active Problems Location of Pain Severity and Description of Pain Patient Has Paino No Site Locations Pain Management and Medication Current Pain Management: Electronic Signature(s) Signed: 10/25/2021 11:32:19 AM By: Sharyn Creamer RN, BSN Entered By: Sharyn Creamer on 10/20/2021 13:58:43 -------------------------------------------------------------------------------- Patient/Caregiver Education Details Patient Name: Date of Service: DA Burgess Estelle 2/22/2023andnbsp2:00 PM Medical Record Number: [id number] Patient Account Number: [id number] Date of Birth/Gender: Treating RN: 07/27/1936 (85 y.o. [name] Primary Care Physician: [name] Other Clinician: Referring Physician: Treating Physician/Extender: Veatrice Bourbon in Treatment: 13 Education Assessment Education Provided To: Patient Education Topics Provided Elevated Blood Sugar/ Impact on Healing: Handouts: Elevated Blood Sugars: How Do They Affect Wound Healing Methods: Explain/Verbal Responses: Reinforcements needed Electronic Signature(s) Signed: 10/20/2021 6:06:00 PM By: Deon Pilling RN, BSN Entered By: Deon Pilling on 10/20/2021 14:56:11 -------------------------------------------------------------------------------- Wound Assessment Details Patient Name: Date of Service: [name], [name] 10/20/2021 2:00 PM Medical Record Number: [id number] Patient Account Number: [id number] Date of Birth/Sex: Treating RN: 02/18/1936 (86 y.o. [name] Primary Care Blane Worthington: [name] Other Clinician: Referring Kaedyn Belardo: Treating Rebecka Oelkers/Extender: Guadalupe Maple, Karrar Weeks in Treatment: 13 Wound Status Wound Number: 10 Primary Venous Leg Ulcer Etiology: Wound Location: Right, Circumferential Lower Leg Wound Open Wounding Event: Blister Status: Date Acquired: 09/12/2021 Comorbid Cataracts, Glaucoma, Chronic Obstructive Pulmonary Disease Weeks Of Treatment: 5 History: (COPD), Arrhythmia, Congestive Heart Failure, Coronary Artery Clustered Wound: No Disease, Hypertension, Type II Diabetes, Gout, Neuropathy Photos Wound Measurements Length: (cm) 1 Width: (cm) 1.7 Depth: (cm) 0.1 Area: (cm) 1.335 Volume: (cm) 0.134 % Reduction in Area: 99.1% % Reduction in Volume: 99.1% Epithelialization: Medium (34-66%) Tunneling: No Undermining: No Wound Description Classification: Full Thickness Without Exposed Support Structures Exudate Amount: Large Exudate Type: Serous Exudate Color: amber Foul Odor After Cleansing: No Slough/Fibrino Yes Wound Bed Granulation Amount: Small (1-33%) Exposed Structure Granulation Quality: Red Fascia Exposed: No Necrotic Amount: Large (67-100%) Fat Layer (Subcutaneous Tissue) Exposed: Yes Necrotic Quality: Eschar Tendon Exposed: No Muscle Exposed: No Joint Exposed: No Bone Exposed: No Electronic Signature(s) Signed: 10/25/2021 11:32:19 AM By: Sharyn Creamer RN, BSN Entered By: Sharyn Creamer on 10/20/2021 14:13:01 -------------------------------------------------------------------------------- Vitals Details Patient Name: Date of Service: [name], [name] 10/20/2021 2:00 PM Medical Record Number: [id number] Patient Account Number: [id number] Date  of Birth/Sex: Treating RN: 1935-10-18 (86 y.o. [name] Primary Care Leshawn Houseworth: [name] Other Clinician: Referring Faron Tudisco: Treating Teon Hudnall/Extender: Guadalupe Maple, Karrar Weeks in Treatment: 13 Vital Signs Time Taken: 13:58 Temperature (F): 97.6 Height (in): 69 Pulse (bpm): 54 Weight (lbs):  185 Respiratory Rate (breaths/min): 20 Body Mass Index (BMI): 27.3 Blood Pressure (mmHg): 117/56 Capillary Blood Glucose (mg/dl): 120 Reference Range: 80 - 120 mg / dl Electronic Signature(s) Signed: 10/25/2021 11:32:19 AM By: Sharyn Creamer RN, BSN Entered By: Sharyn Creamer on 10/20/2021 13:58:34

## 2021-10-27 ENCOUNTER — Encounter (HOSPITAL_BASED_OUTPATIENT_CLINIC_OR_DEPARTMENT_OTHER): Payer: Medicare Other | Attending: Physician Assistant | Admitting: Physician Assistant

## 2021-10-27 ENCOUNTER — Other Ambulatory Visit: Payer: Self-pay

## 2021-10-27 DIAGNOSIS — I509 Heart failure, unspecified: Secondary | ICD-10-CM | POA: Diagnosis not present

## 2021-10-27 DIAGNOSIS — I87333 Chronic venous hypertension (idiopathic) with ulcer and inflammation of bilateral lower extremity: Secondary | ICD-10-CM | POA: Diagnosis not present

## 2021-10-27 DIAGNOSIS — E11622 Type 2 diabetes mellitus with other skin ulcer: Secondary | ICD-10-CM | POA: Diagnosis not present

## 2021-10-27 DIAGNOSIS — I87331 Chronic venous hypertension (idiopathic) with ulcer and inflammation of right lower extremity: Secondary | ICD-10-CM | POA: Diagnosis not present

## 2021-10-27 DIAGNOSIS — Z8673 Personal history of transient ischemic attack (TIA), and cerebral infarction without residual deficits: Secondary | ICD-10-CM | POA: Diagnosis not present

## 2021-10-27 DIAGNOSIS — L97818 Non-pressure chronic ulcer of other part of right lower leg with other specified severity: Secondary | ICD-10-CM | POA: Diagnosis not present

## 2021-10-27 DIAGNOSIS — I2510 Atherosclerotic heart disease of native coronary artery without angina pectoris: Secondary | ICD-10-CM | POA: Insufficient documentation

## 2021-10-27 DIAGNOSIS — I110 Hypertensive heart disease with heart failure: Secondary | ICD-10-CM | POA: Diagnosis not present

## 2021-10-27 DIAGNOSIS — I7300 Raynaud's syndrome without gangrene: Secondary | ICD-10-CM | POA: Diagnosis not present

## 2021-10-27 DIAGNOSIS — L97522 Non-pressure chronic ulcer of other part of left foot with fat layer exposed: Secondary | ICD-10-CM | POA: Insufficient documentation

## 2021-10-27 DIAGNOSIS — Z7901 Long term (current) use of anticoagulants: Secondary | ICD-10-CM | POA: Insufficient documentation

## 2021-10-27 DIAGNOSIS — E11621 Type 2 diabetes mellitus with foot ulcer: Secondary | ICD-10-CM | POA: Diagnosis not present

## 2021-10-27 DIAGNOSIS — Z950 Presence of cardiac pacemaker: Secondary | ICD-10-CM | POA: Insufficient documentation

## 2021-10-27 DIAGNOSIS — E1151 Type 2 diabetes mellitus with diabetic peripheral angiopathy without gangrene: Secondary | ICD-10-CM | POA: Diagnosis not present

## 2021-10-27 DIAGNOSIS — L97812 Non-pressure chronic ulcer of other part of right lower leg with fat layer exposed: Secondary | ICD-10-CM | POA: Diagnosis not present

## 2021-10-27 DIAGNOSIS — I4819 Other persistent atrial fibrillation: Secondary | ICD-10-CM | POA: Insufficient documentation

## 2021-10-27 NOTE — Progress Notes (Signed)
Smithey, [place] M. ([id number]) Visit Report for 10/27/2021 Arrival Information Details Patient Name: Date of Service: [name] ID M. 10/27/2021 3:30 PM Medical Record Number: [id number] Patient Account Number: [id number] Date of Birth/Sex: Treating RN: 05-09-1936 (86 y.o. Burnadette Pop, Lauren Primary Care Tiffanni Scarfo: [name] Other Clinician: Referring Danaka Llera: Treating Savon Cobbs/Extender: Guadalupe Maple, Karrar Weeks in Treatment: 14 Visit Information History Since Last Visit Added or deleted any medications: No Patient Arrived: Ambulatory Any new allergies or adverse reactions: No Arrival Time: 15:11 Had a fall or experienced change in No Accompanied By: self activities of daily living that may affect Transfer Assistance: None risk of falls: Patient Identification Verified: Yes Signs or symptoms of abuse/neglect since last visito No Secondary Verification Process Completed: Yes Hospitalized since last visit: No Patient Requires Transmission-Based Precautions: No Implantable device outside of the clinic excluding No Patient Has Alerts: Yes cellular tissue based products placed in the center since last visit: Has Dressing in Place as Prescribed: Yes Has Compression in Place as Prescribed: No Pain Present Now: No Notes Per patient unable to get compression stocking on left leg. Electronic Signature(s) Signed: 10/27/2021 3:37:08 PM By: Rhae Hammock RN Entered By: Rhae Hammock on 10/27/2021 15:12:25 -------------------------------------------------------------------------------- Clinic Level of Care Assessment Details Patient Name: Date of Service: [name], [name] 10/27/2021 3:30 PM Medical Record Number: [id number] Patient Account Number: [id number] Date of Birth/Sex: Treating RN: 24-Feb-1936 (86 y.o. [name] Primary Care Lucious Zou: [name] Other Clinician: Referring Sanah Kraska: Treating Tonni Mansour/Extender: Guadalupe Maple,  Karrar Weeks in Treatment: 14 Clinic Level of Care Assessment Items TOOL 4 Quantity Score X- 1 0 Use when only an EandM is performed on FOLLOW-UP visit ASSESSMENTS - Nursing Assessment / Reassessment X- 1 10 Reassessment of Co-morbidities (includes updates in patient status) X- 1 5 Reassessment of Adherence to Treatment Plan ASSESSMENTS - Wound and Skin A ssessment / Reassessment X - Simple Wound Assessment / Reassessment - one wound 1 5 _0  - 0 Complex Wound Assessment / Reassessment - multiple wounds X- 1 10 Dermatologic / Skin Assessment (not related to wound area) ASSESSMENTS - Focused Assessment X- 1 5 Circumferential Edema Measurements - multi extremities X- 1 10 Nutritional Assessment / Counseling / Intervention _1  - 0 Lower Extremity Assessment (monofilament, tuning fork, pulses) _2  - 0 Peripheral Arterial Disease Assessment (using hand held doppler) ASSESSMENTS - Ostomy and/or Continence Assessment and Care _3  - 0 Incontinence Assessment and Management _4  - 0 Ostomy Care Assessment and Management (repouching, etc.) PROCESS - Coordination of Care X - Simple Patient / Family Education for ongoing care 1 15 _5  - 0 Complex (extensive) Patient / Family Education for ongoing care X- 1 10 Staff obtains Programmer, systems, Records, T Results / Process Orders est X- 1 10 Staff telephones HHA, Nursing Homes / Clarify orders / etc _6  - 0 Routine Transfer to another Facility (non-emergent condition) _7  - 0 Routine Hospital Admission (non-emergent condition) _8  - 0 New Admissions / Biomedical engineer / Ordering NPWT Apligraf, etc. , _9  - 0 Emergency Hospital Admission (emergent condition) X- 1 10 Simple Discharge Coordination _10  - 0 Complex (extensive) Discharge Coordination PROCESS - Special Needs _11  - 0 Pediatric / Minor Patient Management _12  - 0 Isolation Patient Management _13  - 0 Hearing / Language / Visual special needs _14  - 0 Assessment of Community  assistance (transportation, D/C planning, etc.) _15  - 0 Additional assistance / Altered mentation _16  - 0 Support Surface(s) Assessment (bed, cushion, seat, etc.) INTERVENTIONS -  Wound Cleansing / Measurement X - Simple Wound Cleansing - one wound 1 5 _0  - 0 Complex Wound Cleansing - multiple wounds X- 1 5 Wound Imaging (photographs - any number of wounds) _1  - 0 Wound Tracing (instead of photographs) X- 1 5 Simple Wound Measurement - one wound _2  - 0 Complex Wound Measurement - multiple wounds INTERVENTIONS - Wound Dressings _3  - 0 Small Wound Dressing one or multiple wounds _4  - 0 Medium Wound Dressing one or multiple wounds X- 1 20 Large Wound Dressing one or multiple wounds <NWGNFAOZHYQMVHQI>_6<\/NGEXBMWUXLKGMWNU>_2  - 0 Application of Medications - topical <VOZDGUYQIHKVQQVZ>_5<\/GLOVFIEPPIRJJOAC>_1  - 0 Application of Medications - injection INTERVENTIONS - Miscellaneous _7  - 0 External ear exam _8  - 0 Specimen Collection (cultures, biopsies, blood, body fluids, etc.) _9  - 0 Specimen(s) / Culture(s) sent or taken to Lab for analysis _10  - 0 Patient Transfer (multiple staff / Civil Service fast streamer / Similar devices) _11  - 0 Simple Staple / Suture removal (25 or less) _12  - 0 Complex Staple / Suture removal (26 or more) _13  - 0 Hypo / Hyperglycemic Management (close monitor of Blood Glucose) _14  - 0 Ankle / Brachial Index (ABI) - do not check if billed separately X- 1 5 Vital Signs Has the patient been seen at the hospital within the last three years: Yes Total Score: 130 Level Of Care: New/Established - Level 4 Electronic Signature(s) Signed: 10/27/2021 4:00:28 PM By: Deon Pilling RN, BSN Entered By: Deon Pilling on 10/27/2021 15:39:00 -------------------------------------------------------------------------------- Encounter Discharge Information Details Patient Name: Date of Service: [name], [name] 10/27/2021 3:30 PM Medical Record Number: [id number] Patient Account Number: [id number] Date of Birth/Sex: Treating RN: [birth year] (86 y.o. [name] Primary Care Kevonte Vanecek: [name] Other Clinician: Referring Kynslei Art: Treating Bryndle Corredor/Extender: Guadalupe Maple, Karrar Weeks in Treatment: 14 Encounter Discharge Information Items Discharge Condition: Stable Ambulatory Status: Cane Discharge Destination: Home Transportation: Private Auto Accompanied By: self Schedule Follow-up Appointment: Yes Clinical Summary of Care: Electronic Signature(s) Signed: 10/27/2021 4:00:28 PM By: Deon Pilling RN, BSN Entered By: Deon Pilling on 10/27/2021 15:39:53 -------------------------------------------------------------------------------- Lower Extremity Assessment Details Patient Name: Date of Service: [name], [name] 10/27/2021 3:30 PM Medical Record Number: [id number] Patient Account Number: [id number] Date of Birth/Sex: Treating RN: 1935-10-19 (86 y.o. Burnadette Pop, Lauren Primary Care Iraida Cragin: [name] Other Clinician: Referring Judit Awad: Treating Aren Pryde/Extender: Guadalupe Maple, Karrar Weeks in Treatment: 14 Edema Assessment Assessed: Shirlyn Goltz: No] Patrice Paradise: Yes] Edema: [Left: Yes] [Right: Yes] Calf Left: Right: Point of Measurement: 31 cm From Medial Instep 34 cm 33 cm Ankle Left: Right: Point of Measurement: 11 cm From Medial Instep 22 cm 22 cm Vascular Assessment Pulses: Dorsalis Pedis Palpable: [Right:Yes] Posterior Tibial Palpable: [Right:Yes] Electronic Signature(s) Signed: 10/27/2021 3:37:08 PM By: Rhae Hammock RN Entered By: Rhae Hammock on 10/27/2021 15:16:05 -------------------------------------------------------------------------------- [place] Details Patient Name: Date of Service: [name], [name] 10/27/2021 3:30 PM Medical Record Number: [id number] Patient Account Number: [id number] Date of Birth/Sex: Treating RN: 1936-08-16 (86 y.o. [name] Primary Care Tyreek Clabo: [name] Other Clinician: Referring Shaquira Moroz: Treating  Taliyah Watrous/Extender: Veatrice Bourbon in Treatment: [place] reviewed with physician Active Inactive Nutrition Nursing Diagnoses: Impaired glucose control: actual or potential Potential for alteratiion in Nutrition/Potential for imbalanced nutrition Goals: Patient/caregiver will maintain therapeutic glucose control Date Initiated: 07/15/2021 Target Resolution Date: 10/29/2021 Goal Status: Active Interventions: Assess HgA1c results as ordered upon admission and as needed Assess patient nutrition upon  admission and as needed per policy Provide education on elevated blood sugars and impact on wound healing Treatment Activities: Dietary management education, guidance and counseling : 07/15/2021 Giving encouragement to exercise : 07/15/2021 Patient referred to Primary Care Physician for further nutritional evaluation : 07/15/2021 Notes: 08/11/21: Glucose control ongoing 09/15/20: Glucose control continues Venous Leg Ulcer Nursing Diagnoses: Knowledge deficit related to disease process and management Potential for venous Insuffiency (use before diagnosis confirmed) Goals: Patient will maintain optimal edema control Date Initiated: 07/15/2021 Target Resolution Date: 10/29/2021 Goal Status: Active Interventions: Assess peripheral edema status every visit. Compression as ordered Provide education on venous insufficiency Treatment Activities: Therapeutic compression applied : 07/15/2021 Notes: 08/11/21: Edema control ongoing 09/15/20: Edema control continues with wraps Wound/Skin Impairment Nursing Diagnoses: Impaired tissue integrity Knowledge deficit related to ulceration/compromised skin integrity Goals: Patient/caregiver will verbalize understanding of skin care regimen Date Initiated: 07/15/2021 Target Resolution Date: 11/19/2021 Goal Status: Active Ulcer/skin breakdown will have a volume reduction of 30% by week 4 Date Initiated:  07/15/2021 Date Inactivated: 08/11/2021 Target Resolution Date: 08/12/2021 Goal Status: Met Ulcer/skin breakdown will have a volume reduction of 50% by week 8 Date Initiated: 08/11/2021 Date Inactivated: 10/06/2021 Target Resolution Date: 09/15/2021 Goal Status: Met Ulcer/skin breakdown will have a volume reduction of 80% by week 12 Date Initiated: 10/06/2021 Target Resolution Date: 11/03/2021 Goal Status: Active Interventions: Assess patient/caregiver ability to obtain necessary supplies Assess patient/caregiver ability to perform ulcer/skin care regimen upon admission and as needed Assess ulceration(s) every visit Provide education on ulcer and skin care Treatment Activities: Skin care regimen initiated : 07/15/2021 Topical wound management initiated : 07/15/2021 Notes: 09/15/20: Wound care regimen continues, patient compliant. Electronic Signature(s) Signed: 10/27/2021 4:00:28 PM By: Deon Pilling RN, BSN Entered By: Deon Pilling on 10/27/2021 15:21:54 -------------------------------------------------------------------------------- Pain Assessment Details Patient Name: Date of Service: [name], [name] 10/27/2021 3:30 PM Medical Record Number: [id number] Patient Account Number: [id number] Date of Birth/Sex: Treating RN: 06-Jun-1936 (86 y.o. Erie Noe Primary Care Dezarai Prew: [name] Other Clinician: Referring Beryle Zeitz: Treating Trenese Haft/Extender: Guadalupe Maple, Karrar Weeks in Treatment: 14 Active Problems Location of Pain Severity and Description of Pain Patient Has Paino No Site Locations Rate the pain. Rate the pain. Current Pain Level: 0 Pain Management and Medication Current Pain Management: Medication: No Cold Application: No Rest: No Massage: No Activity: No T.E.N.S.: No Heat Application: No Leg drop or elevation: No Is the Current Pain Management Adequate: Adequate How does your wound impact your activities of daily livingo Sleep:  No Bathing: No Appetite: No Relationship With Others: No Bladder Continence: No Emotions: No Bowel Continence: No Work: No Toileting: No Drive: No Dressing: No Hobbies: No Electronic Signature(s) Signed: 10/27/2021 3:37:08 PM By: Rhae Hammock RN Entered By: Rhae Hammock on 10/27/2021 15:12:40 -------------------------------------------------------------------------------- Patient/Caregiver Education Details Patient Name: Date of Service: DA Burgess Estelle 3/1/2023andnbsp3:30 PM Medical Record Number: [id number] Patient Account Number: [id number] Date of Birth/Gender: Treating RN: June 20, 1936 (86 y.o. [name] Primary Care Physician: [name] Other Clinician: Referring Physician: Treating Physician/Extender: Veatrice Bourbon in Treatment: 14 Education Assessment Education Provided To: Patient Education Topics Provided Wound/Skin Impairment: Handouts: Skin Care Do's and Dont's Methods: Explain/Verbal Responses: Reinforcements needed Electronic Signature(s) Signed: 10/27/2021 4:00:28 PM By: Deon Pilling RN, BSN Entered By: Deon Pilling on 10/27/2021 15:22:08 -------------------------------------------------------------------------------- Wound Assessment Details Patient Name: Date of Service: [name], Shaune Pascal ID M. 10/27/2021 3:30 PM Medical Record Number: [id number] Patient Account Number: [id number] Date  of Birth/Sex: Treating RN: August 17, 1936 (86 y.o. Burnadette Pop, Lauren Primary Care Sherian Valenza: [name] Other Clinician: Referring Rayshaun Needle: Treating Deolinda Frid/Extender: Guadalupe Maple, Karrar Weeks in Treatment: 14 Wound Status Wound Number: 10 Primary Venous Leg Ulcer Etiology: Wound Location: Right, Circumferential Lower Leg Wound Open Wounding Event: Blister Status: Date Acquired: 09/12/2021 Comorbid Cataracts, Glaucoma, Chronic Obstructive Pulmonary Disease Weeks Of Treatment: 6 History: (COPD), Arrhythmia,  Congestive Heart Failure, Coronary Artery Clustered Wound: No Disease, Hypertension, Type II Diabetes, Gout, Neuropathy Photos Wound Measurements Length: (cm) 0.8 Width: (cm) 1.1 Depth: (cm) 0.1 Area: (cm) 0.691 Volume: (cm) 0.069 % Reduction in Area: 99.6% % Reduction in Volume: 99.6% Epithelialization: Medium (34-66%) Tunneling: No Undermining: No Wound Description Classification: Full Thickness Without Exposed Support Structures Exudate Amount: Large Exudate Type: Serous Exudate Color: amber Foul Odor After Cleansing: No Slough/Fibrino Yes Wound Bed Granulation Amount: Small (1-33%) Exposed Structure Granulation Quality: Red Fascia Exposed: No Necrotic Amount: Large (67-100%) Fat Layer (Subcutaneous Tissue) Exposed: Yes Necrotic Quality: Eschar Tendon Exposed: No Muscle Exposed: No Joint Exposed: No Bone Exposed: No Treatment Notes Wound #10 (Lower Leg) Wound Laterality: Right, Circumferential Cleanser Soap and Water Discharge Instruction: May shower and wash wound with dial antibacterial soap and water prior to dressing change. Wound Cleanser Discharge Instruction: Cleanse the wound with wound cleanser prior to applying a clean dressing using gauze sponges, not tissue or cotton balls. Peri-Wound Care Triamcinolone 15 (g) Discharge Instruction: Use triamcinolone 15 (g) as directed Sween Lotion (Moisturizing lotion) Discharge Instruction: Apply moisturizing lotion as directed Topical Primary Dressing adaptic Discharge Instruction: apply to wound bed. Secondary Dressing Woven Gauze Sponge, Non-Sterile 4x4 in Discharge Instruction: Apply over primary dressing as directed. ABD Pad, 8x10 Discharge Instruction: Apply over primary dressing as directed. Secured With Compression Wrap Kerlix Roll 4.5x3.1 (in/yd) Discharge Instruction: Apply Kerlix and Coban compression as directed. Coban Self-Adherent Wrap 4x5 (in/yd) Discharge Instruction: Apply over Kerlix as  directed. Compression Stockings Add-Ons Electronic Signature(s) Signed: 10/27/2021 3:37:08 PM By: Rhae Hammock RN Entered By: Rhae Hammock on 10/27/2021 15:17:48 -------------------------------------------------------------------------------- Vitals Details Patient Name: Date of Service: [name], [name] 10/27/2021 3:30 PM Medical Record Number: [id number] Patient Account Number: [id number] Date of Birth/Sex: Treating RN: [birth year] (86 y.o. Burnadette Pop, Lauren Primary Care Macen Joslin: [name] Other Clinician: Referring Hendrick Pavich: Treating Kade Rickels/Extender: Guadalupe Maple, Karrar Weeks in Treatment: 14 Vital Signs Time Taken: 15:10 Temperature (F): 97.6 Height (in): 69 Pulse (bpm): 74 Weight (lbs): 185 Respiratory Rate (breaths/min): 20 Body Mass Index (BMI): 27.3 Blood Pressure (mmHg): 145/81 Capillary Blood Glucose (mg/dl): 100 Reference Range: 80 - 120 mg / dl Electronic Signature(s) Signed: 10/27/2021 3:37:08 PM By: Rhae Hammock RN Entered By: Rhae Hammock on 10/27/2021 15:14:32

## 2021-10-27 NOTE — Progress Notes (Signed)
Spiewak, Brick M. ([id number]) Visit Report for 10/27/2021 Chief Complaint Document Details Patient Name: Date of Service: [name] ID M. 10/27/2021 3:30 PM Medical Record Number: [id number] Patient Account Number: [id number] Date of Birth/Sex: Treating RN: [birth year] (86 y.o. M) Primary Care Provider: [name] Other Clinician: Referring Provider: Treating Provider/Extender: Guadalupe Maple, Karrar Weeks in Treatment: 14 Information Obtained from: Patient Chief Complaint 07/15/2021 patient is here for wounds on his bilateral lower legs Electronic Signature(s) Signed: 10/27/2021 3:03:51 PM By: Worthy Keeler PA-C Entered By: Worthy Keeler on 10/27/2021 15:03:51 -------------------------------------------------------------------------------- HPI Details Patient Name: Date of Service: [name], [name] 10/27/2021 3:30 PM Medical Record Number: [id number] Patient Account Number: [id number] Date of Birth/Sex: Treating RN: 1935-10-01 (86 y.o. M) Primary Care Provider: [name] Other Clinician: Referring Provider: Treating Provider/Extender: Guadalupe Maple, Karrar Weeks in Treatment: 14 History of Present Illness HPI Description: ADMISSION 07/15/2021 This is an 86 year old man who came here for evaluation of wounds on his bilateral predominantly anterior lower legs mid aspect of the tibia. He seems to have been dealing with this for most of this year. Currently with 3 wounds on the right and 2 on the left. At least some of these seem to start off as blisters probably the majority. He has been following with Dr. Anabel Bene of [facility] dermatology according the patient back he has an appointment Dr. Anabel Bene tomorrow he was given a prescription for triamcinolone which she has been applying daily in fact it was renewed yesterday. Patient is a diabetic no recent hemoglobin A1c. He is on Coumadin for persistent atrial fibrillation. Vein and vascular in December 2021 at  which time he had bilateral foot pain and finger pain. ABIs bilaterally were noncompressible however the TBI on the right was only 0.39. There were biphasic waveforms on the left his ABI was noncompressible with biphasic waveforms but with a TBI of 0.6. He was not felt to have a primary vascular issue. There is a mention of Raynaud's phenomenon as well. Past medical history includes cellulitis of the right leg in June 2022, type 2 diabetes, persistent lower extremity edema, bilateral foot and ankle pain, Raynaud's phenomenon, coronary artery disease, congestive heart failure, pacemaker, none sustained ventricular tachycardia, remote CVA paroxysmal atrial fib on Coumadin. We did not reattempt ABIs in our clinic 07/22/2019 patient appears to be doing decently well in regard to his wounds as compared to last week with the wounds that were present last week. Fortunately I do not see any signs of active infection which is great news. No fevers, chills, nausea, vomiting, or diarrhea. With that being said he has several new blisters that are getting need to be addressed today. 07/28/2021 upon evaluation today patient actually appears to be making excellent progress. I am extremely pleased with where we stand today. I think that his wounds are significantly improved compared to where they were previous. 08/04/2021 upon evaluation today patient appears to be doing much better in regard to his wounds in general. I feel like across the board he is showing signs of significant improvement. Fortunately there is no evidence of infection which is great news. No fevers, chills, nausea, vomiting, or diarrhea. 08/11/2021 upon evaluation today patient actually appears to be doing quite well in regard to his wound. Has been tolerating the dressing changes without complication. Fortunately I do not see any signs of infection currently which is great news as well. No fevers, chills, nausea, vomiting, or  diarrhea. 08/18/2021  on evaluation today patient appears to be doing excellent he is making good progress and overall I am extremely pleased with where we stand today. There does not appear to be any signs of active infection at this time which is great news. 09/01/2020 upon evaluation patient's wounds on both the left and the right appear to be at 0.1 cm measurement and are very close to complete resolution. Were not quite completely done yet but this is very close. Overall I am extremely happy with where things stand and I think he is headed in the right direction. 09/08/2021 upon evaluation today patient appears to be doing well in regard to his right leg which is showing signs of being completely healed which is excellent. Fortunately I do not see any evidence of active infection locally nor systemically at this point which is also great news. No fevers, chills, nausea, vomiting, or diarrhea. 09/15/2021; this is a patient we have been following for now a small wound on the left dorsal foot. He had wounds on the right legs and was discharged to 20/30 below-knee stockings apparently in late December. He came in today with marked increase in swelling in the right leg large areas of denuded skin which probably started as blisters. He did not have a stocking on 10/06/2021 upon evaluation today patient appears to be doing decently well in regard to his wounds. On the left leg he is completely healed on the right leg he does still have a small area that is open although again its not as good as it was last time I saw him is also not as bad as it was in the beginning. He has been in the hospital and they did not wrap him as part of the issue I believe here as well. 10/20/2021 upon evaluation today patient appears to be doing well with regard to his leg ulcer. He has been tolerating the dressing changes without complication. Fortunately I do not see any signs of active infection locally or systemically at this  point. No fevers, chills, nausea, vomiting, or diarrhea. 10/27/2021 upon evaluation today patient appears to be doing well with regard to his wound. In fact this appears to be almost completely healed which is great news. Fortunately I do not see any evidence of active infection locally nor systemically which is great news. No fevers, chills, nausea, vomiting, or diarrhea. Electronic Signature(s) Signed: 10/27/2021 3:32:27 PM By: Worthy Keeler PA-C Entered By: Worthy Keeler on 10/27/2021 15:32:27 -------------------------------------------------------------------------------- Physical Exam Details Patient Name: Date of Service: [name], [name] 10/27/2021 3:30 PM Medical Record Number: [id number] Patient Account Number: [id number] Date of Birth/Sex: Treating RN: May 17, 1936 (86 y.o. M) Primary Care Provider: [name] Other Clinician: Referring Provider: Treating Provider/Extender: Guadalupe Maple, Karrar Weeks in Treatment: 53 Constitutional Well-nourished and well-hydrated in no acute distress. Respiratory normal breathing without difficulty. Psychiatric this patient is able to make decisions and demonstrates good insight into disease process. Alert and Oriented x 3. pleasant and cooperative. Notes Patient's wound again shows almost complete epithelization and appears to be doing excellent. I do not see any evidence of active infection locally nor systemically at this point. I think probably just protecting the area will be the best thing to do over the next week and hopefully he will be ready for discharge come next week if not at least in the next 2 weeks. Electronic Signature(s) Signed: 10/27/2021 3:32:47 PM By: Worthy Keeler PA-C Entered By: Worthy Keeler  on 10/27/2021 15:32:47 -------------------------------------------------------------------------------- Physician Orders Details Patient Name: Date of Service: [name] ID M. 10/27/2021 3:30 PM Medical Record  Number: [id number] Patient Account Number: [id number] Date of Birth/Sex: Treating RN: [birth year] (86 y.o. [name] Primary Care Provider: [name] Other Clinician: Referring Provider: Treating Provider/Extender: Veatrice Bourbon in Treatment: [phone] Verbal / Phone Orders: No Diagnosis Coding ICD-10 Coding Code Description L97.818 Non-pressure chronic ulcer of other part of right lower leg with other specified severity L97.522 Non-pressure chronic ulcer of other part of left foot with fat layer exposed I87.333 Chronic venous hypertension (idiopathic) with ulcer and inflammation of bilateral lower extremity E11.51 Type 2 diabetes mellitus with diabetic peripheral angiopathy without gangrene Follow-up Appointments ppointment in 1 week. - with [facility] Room overflow Return A Other: - [employer] to pick up support stockings 10-28mmHg. Apply to left leg in the morning and remove at night. Bring in the other stocking next week at appt time. Bathing/ Shower/ Hygiene May shower with protection but do not get wound dressing(s) wet. Edema Control - Lymphedema / SCD / Other Bilateral Lower Extremities Elevate legs to the level of the heart or above for 30 minutes daily and/or when sitting, a frequency of: - whenever sitting Avoid standing for long periods of time. Patient to wear own compression stockings every day. Exercise regularly Moisturize legs daily. - Left leg every night before bed. Compression stocking or Garment 10-20 mm/Hg pressure to: - apply in the morning and remove at night.- purchase at [employer]. Additional Orders / Instructions Follow Nutritious Diet - Monitor blood sugars Wound Treatment Wound #10 - Lower Leg Wound Laterality: Right, Circumferential Cleanser: Soap and Water 1 x Per Week/30 Days Discharge Instructions: May shower and wash wound with dial antibacterial soap and water prior to dressing change. Cleanser: Wound Cleanser 1 x Per Week/30  Days Discharge Instructions: Cleanse the wound with wound cleanser prior to applying a clean dressing using gauze sponges, not tissue or cotton balls. Peri-Wound Care: Triamcinolone 15 (g) 1 x Per Week/30 Days Discharge Instructions: Use triamcinolone 15 (g) as directed Peri-Wound Care: Sween Lotion (Moisturizing lotion) 1 x Per Week/30 Days Discharge Instructions: Apply moisturizing lotion as directed Prim Dressing: adaptic 1 x Per Week/30 Days ary Discharge Instructions: apply to wound bed. Secondary Dressing: Woven Gauze Sponge, Non-Sterile 4x4 in 1 x Per Week/30 Days Discharge Instructions: Apply over primary dressing as directed. Secondary Dressing: ABD Pad, 8x10 1 x Per Week/30 Days Discharge Instructions: Apply over primary dressing as directed. Compression Wrap: Kerlix Roll 4.5x3.1 (in/yd) 1 x Per Week/30 Days Discharge Instructions: Apply Kerlix and Coban compression as directed. Compression Wrap: Coban Self-Adherent Wrap 4x5 (in/yd) 1 x Per Week/30 Days Discharge Instructions: Apply over Kerlix as directed. Electronic Signature(s) Signed: 10/27/2021 4:00:28 PM By: Deon Pilling RN, BSN Entered By: Deon Pilling on 10/27/2021 15:34:20 -------------------------------------------------------------------------------- Problem List Details Patient Name: Date of Service: [name], [name] 10/27/2021 3:30 PM Medical Record Number: [id number] Patient Account Number: [id number] Date of Birth/Sex: Treating RN: [birth year] (86 y.o. M) Primary Care Provider: [name] Other Clinician: Referring Provider: Treating Provider/Extender: Veatrice Bourbon in Treatment: 14 Active Problems ICD-10 Encounter Code Description Active Date MDM Diagnosis L97.818 Non-pressure chronic ulcer of other part of right lower leg with other specified 07/15/2021 No Yes severity L97.522 Non-pressure chronic ulcer of other part of left foot with fat layer exposed 08/04/2021 No  Yes I87.333 Chronic venous hypertension (idiopathic) with ulcer and inflammation of  07/15/2021 No Yes bilateral lower extremity E11.51 Type 2 diabetes mellitus with diabetic peripheral angiopathy without gangrene 07/15/2021 No Yes Inactive Problems ICD-10 Code Description Active Date Inactive Date L97.828 Non-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 leg with other specified severity 07/15/2021 07/15/2021 Resolved Problems Electronic Signature(s) Signed: 10/27/2021 3:03:43 PM By: Worthy Keeler PA-C Entered By: Worthy Keeler on 10/27/2021 15:03:43 -------------------------------------------------------------------------------- Progress Note Details Patient Name: Date of Service: [name], [name] 10/27/2021 3:30 PM Medical Record Number: [id number] Patient Account Number: [id number] Date of Birth/Sex: Treating RN: 26-Nov-1935 (86 y.o. M) Primary Care Provider: [name] Other Clinician: Referring Provider: Treating Provider/Extender: Guadalupe Maple, Karrar Weeks in Treatment: 14 Subjective Chief Complaint Information obtained from Patient 07/15/2021 patient is here for wounds on his bilateral lower legs History of Present Illness (HPI) ADMISSION 07/15/2021 This is an 86 year old man who came here for evaluation of wounds on his bilateral predominantly anterior lower legs mid aspect of the tibia. He seems to have been dealing with this for most of this year. Currently with 3 wounds on the right and 2 on the left. At least some of these seem to start off as blisters probably the majority. He has been following with Dr. Anabel Bene of [facility] dermatology according the patient back he has an appointment Dr. Anabel Bene tomorrow he was given a prescription for triamcinolone which she has been applying daily in fact it was renewed yesterday. Patient is a diabetic no recent hemoglobin A1c. He is on Coumadin for persistent atrial fibrillation. Vein and vascular in  December 2021 at which time he had bilateral foot pain and finger pain. ABIs bilaterally were noncompressible however the TBI on the right was only 0.39. There were biphasic waveforms on the left his ABI was noncompressible with biphasic waveforms but with a TBI of 0.6. He was not felt to have a primary vascular issue. There is a mention of Raynaud's phenomenon as well. Past medical history includes cellulitis of the right leg in June 2022, type 2 diabetes, persistent lower extremity edema, bilateral foot and ankle pain, Raynaud's phenomenon, coronary artery disease, congestive heart failure, pacemaker, none sustained ventricular tachycardia, remote CVA paroxysmal atrial fib on Coumadin. We did not reattempt ABIs in our clinic 07/22/2019 patient appears to be doing decently well in regard to his wounds as compared to last week with the wounds that were present last week. Fortunately I do not see any signs of active infection which is great news. No fevers, chills, nausea, vomiting, or diarrhea. With that being said he has several new blisters that are getting need to be addressed today. 07/28/2021 upon evaluation today patient actually appears to be making excellent progress. I am extremely pleased with where we stand today. I think that his wounds are significantly improved compared to where they were previous. 08/04/2021 upon evaluation today patient appears to be doing much better in regard to his wounds in general. I feel like across the board he is showing signs of significant improvement. Fortunately there is no evidence of infection which is great news. No fevers, chills, nausea, vomiting, or diarrhea. 08/11/2021 upon evaluation today patient actually appears to be doing quite well in regard to his wound. Has been tolerating the dressing changes without complication. Fortunately I do not see any signs of infection currently which is great news as well. No fevers, chills, nausea, vomiting, or  diarrhea. 08/18/2021 on evaluation today patient appears to be doing excellent he is making good progress  and overall I am extremely pleased with where we stand today. There does not appear to be any signs of active infection at this time which is great news. 09/01/2020 upon evaluation patient's wounds on both the left and the right appear to be at 0.1 cm measurement and are very close to complete resolution. Were not quite completely done yet but this is very close. Overall I am extremely happy with where things stand and I think he is headed in the right direction. 09/08/2021 upon evaluation today patient appears to be doing well in regard to his right leg which is showing signs of being completely healed which is excellent. Fortunately I do not see any evidence of active infection locally nor systemically at this point which is also great news. No fevers, chills, nausea, vomiting, or diarrhea. 09/15/2021; this is a patient we have been following for now a small wound on the left dorsal foot. He had wounds on the right legs and was discharged to 20/30 below-knee stockings apparently in late December. He came in today with marked increase in swelling in the right leg large areas of denuded skin which probably started as blisters. He did not have a stocking on 10/06/2021 upon evaluation today patient appears to be doing decently well in regard to his wounds. On the left leg he is completely healed on the right leg he does still have a small area that is open although again its not as good as it was last time I saw him is also not as bad as it was in the beginning. He has been in the hospital and they did not wrap him as part of the issue I believe here as well. 10/20/2021 upon evaluation today patient appears to be doing well with regard to his leg ulcer. He has been tolerating the dressing changes without complication. Fortunately I do not see any signs of active infection locally or systemically at this  point. No fevers, chills, nausea, vomiting, or diarrhea. 10/27/2021 upon evaluation today patient appears to be doing well with regard to his wound. In fact this appears to be almost completely healed which is great news. Fortunately I do not see any evidence of active infection locally nor systemically which is great news. No fevers, chills, nausea, vomiting, or diarrhea. Objective Constitutional Well-nourished and well-hydrated in no acute distress. Vitals Time Taken: 3:10 PM, Height: 69 in, Weight: 185 lbs, BMI: 27.3, Temperature: 97.6 F, Pulse: 74 bpm, Respiratory Rate: 20 breaths/min, Blood Pressure: 145/81 mmHg, Capillary Blood Glucose: 100 mg/dl. Respiratory normal breathing without difficulty. Psychiatric this patient is able to make decisions and demonstrates good insight into disease process. Alert and Oriented x 3. pleasant and cooperative. General Notes: Patient's wound again shows almost complete epithelization and appears to be doing excellent. I do not see any evidence of active infection locally nor systemically at this point. I think probably just protecting the area will be the best thing to do over the next week and hopefully he will be ready for discharge come next week if not at least in the next 2 weeks. Integumentary (Hair, Skin) Wound #10 status is Open. Original cause of wound was Blister. The date acquired was: 09/12/2021. The wound has been in treatment 6 weeks. The wound is located on the Right,Circumferential Lower Leg. The wound measures 0.8cm length x 1.1cm width x 0.1cm depth; 0.691cm^2 area and 0.069cm^3 volume. There is Fat Layer (Subcutaneous Tissue) exposed. There is no tunneling or undermining noted. There is a large  amount of serous drainage noted. There is small (1-33%) red granulation within the wound bed. There is a large (67-100%) amount of necrotic tissue within the wound bed including Eschar. Assessment Active Problems ICD-10 Non-pressure chronic  ulcer of other part of right lower leg with other specified severity Non-pressure chronic ulcer of other part of left foot with fat layer exposed Chronic venous hypertension (idiopathic) with ulcer and inflammation of bilateral lower extremity Type 2 diabetes mellitus with diabetic peripheral angiopathy without gangrene Plan Follow-up Appointments: Return Appointment in 1 week. - with [facility] Room overflow Other: - [employer] to pick up support stockings 10-29mmHg. Apply to left leg in the morning and remove at night. Bring in the other stocking next week at appt time. Bathing/ Shower/ Hygiene: May shower with protection but do not get wound dressing(s) wet. Edema Control - Lymphedema / SCD / Other: Elevate legs to the level of the heart or above for 30 minutes daily and/or when sitting, a frequency of: - whenever sitting Avoid standing for long periods of time. Patient to wear own compression stockings every day. Exercise regularly Moisturize legs daily. - Left leg every night before bed. Compression stocking or Garment 10-20 mm/Hg pressure to: - apply in the morning and remove at night.- purchase at [employer]. Additional Orders / Instructions: Follow Nutritious Diet - Monitor blood sugars WOUND #10: - Lower Leg Wound Laterality: Right, Circumferential Cleanser: Soap and Water 1 x Per Week/30 Days Discharge Instructions: May shower and wash wound with dial antibacterial soap and water prior to dressing change. Cleanser: Wound Cleanser 1 x Per Week/30 Days Discharge Instructions: Cleanse the wound with wound cleanser prior to applying a clean dressing using gauze sponges, not tissue or cotton balls. Peri-Wound Care: Triamcinolone 15 (g) 1 x Per Week/30 Days Discharge Instructions: Use triamcinolone 15 (g) as directed Peri-Wound Care: Sween Lotion (Moisturizing lotion) 1 x Per Week/30 Days Discharge Instructions: Apply moisturizing lotion as directed Prim Dressing: adaptic 1 x Per Week/30  Days ary Discharge Instructions: apply to wound bed. Secondary Dressing: Woven Gauze Sponge, Non-Sterile 4x4 in 1 x Per Week/30 Days Discharge Instructions: Apply over primary dressing as directed. Secondary Dressing: ABD Pad, 8x10 1 x Per Week/30 Days Discharge Instructions: Apply over primary dressing as directed. Com pression Wrap: Kerlix Roll 4.5x3.1 (in/yd) 1 x Per Week/30 Days Discharge Instructions: Apply Kerlix and Coban compression as directed. Com pression Wrap: Coban Self-Adherent Wrap 4x5 (in/yd) 1 x Per Week/30 Days Discharge Instructions: Apply over Kerlix as directed. 1. Would recommend that we use Adaptic just for protection over the wound area. 2. I am also can recommend that we have the patient utilize the compression wrapping which I think is doing a good job. Again we have been utilizing the Curlex and Coban wrap which I think has been doing a very good job for him. We will see patient back for reevaluation in 1 week here in the clinic. If anything worsens or changes patient will contact our office for additional recommendations. Electronic Signature(s) Signed: 10/27/2021 3:37:26 PM By: Worthy Keeler PA-C Previous Signature: 10/27/2021 3:33:25 PM Version By: Worthy Keeler PA-C Entered By: Worthy Keeler on 10/27/2021 15:37:25 -------------------------------------------------------------------------------- SuperBill Details Patient Name: Date of Service: [name], [name] 10/27/2021 Medical Record Number: [id number] Patient Account Number: [id number] Date of Birth/Sex: Treating RN: 06-May-1936 (86 y.o. M) Primary Care Provider: [name] Other Clinician: Referring Provider: Treating Provider/Extender: Guadalupe Maple, Karrar Weeks in Treatment: 14 Diagnosis Coding ICD-10 Codes Code  Description L97.818 Non-pressure chronic ulcer of other part of right lower leg with other specified severity L97.522 Non-pressure chronic ulcer of other part of left foot  with fat layer exposed I87.333 Chronic venous hypertension (idiopathic) with ulcer and inflammation of bilateral lower extremity E11.51 Type 2 diabetes mellitus with diabetic peripheral angiopathy without gangrene Facility Procedures CPT4 Code: [id number] Description: 99214 - WOUND CARE VISIT-LEV 4 EST PT Modifier: Quantity: 1 Physician Procedures : CPT4 Code Description Modifier [id number] - WC PHYS LEVEL 4 - EST PT ICD-10 Diagnosis Description L97.818 Non-pressure chronic ulcer of other part of right lower leg with other specified severity L97.522 Non-pressure chronic ulcer of other part of  left foot with fat layer exposed I87.333 Chronic venous hypertension (idiopathic) with ulcer and inflammation of bilateral lower extremity E11.51 Type 2 diabetes mellitus with diabetic peripheral angiopathy without gangrene Quantity: 1 Electronic Signature(s) Signed: 10/27/2021 4:00:28 PM By: Deon Pilling RN, BSN Previous Signature: 10/27/2021 3:33:38 PM Version By: Worthy Keeler PA-C Entered By: Deon Pilling on 10/27/2021 15:39:09

## 2021-11-03 ENCOUNTER — Other Ambulatory Visit: Payer: Self-pay

## 2021-11-03 ENCOUNTER — Encounter (HOSPITAL_BASED_OUTPATIENT_CLINIC_OR_DEPARTMENT_OTHER): Payer: Medicare Other | Admitting: Physician Assistant

## 2021-11-03 DIAGNOSIS — L97818 Non-pressure chronic ulcer of other part of right lower leg with other specified severity: Secondary | ICD-10-CM | POA: Diagnosis not present

## 2021-11-03 DIAGNOSIS — Z8673 Personal history of transient ischemic attack (TIA), and cerebral infarction without residual deficits: Secondary | ICD-10-CM | POA: Diagnosis not present

## 2021-11-03 DIAGNOSIS — I509 Heart failure, unspecified: Secondary | ICD-10-CM | POA: Diagnosis not present

## 2021-11-03 DIAGNOSIS — Z950 Presence of cardiac pacemaker: Secondary | ICD-10-CM | POA: Diagnosis not present

## 2021-11-03 DIAGNOSIS — I2510 Atherosclerotic heart disease of native coronary artery without angina pectoris: Secondary | ICD-10-CM | POA: Diagnosis not present

## 2021-11-03 DIAGNOSIS — E11621 Type 2 diabetes mellitus with foot ulcer: Secondary | ICD-10-CM | POA: Diagnosis not present

## 2021-11-03 DIAGNOSIS — E11622 Type 2 diabetes mellitus with other skin ulcer: Secondary | ICD-10-CM | POA: Diagnosis not present

## 2021-11-03 DIAGNOSIS — I110 Hypertensive heart disease with heart failure: Secondary | ICD-10-CM | POA: Diagnosis not present

## 2021-11-03 DIAGNOSIS — I4819 Other persistent atrial fibrillation: Secondary | ICD-10-CM | POA: Diagnosis not present

## 2021-11-03 DIAGNOSIS — Z7901 Long term (current) use of anticoagulants: Secondary | ICD-10-CM | POA: Diagnosis not present

## 2021-11-03 DIAGNOSIS — L97522 Non-pressure chronic ulcer of other part of left foot with fat layer exposed: Secondary | ICD-10-CM | POA: Diagnosis not present

## 2021-11-03 DIAGNOSIS — I87333 Chronic venous hypertension (idiopathic) with ulcer and inflammation of bilateral lower extremity: Secondary | ICD-10-CM | POA: Diagnosis not present

## 2021-11-03 DIAGNOSIS — I7300 Raynaud's syndrome without gangrene: Secondary | ICD-10-CM | POA: Diagnosis not present

## 2021-11-03 DIAGNOSIS — E1151 Type 2 diabetes mellitus with diabetic peripheral angiopathy without gangrene: Secondary | ICD-10-CM | POA: Diagnosis not present

## 2021-11-03 NOTE — Progress Notes (Addendum)
Mcinerny, Vincent M. ([id number]) Visit Report for 11/03/2021 Chief Complaint Document Details Patient Name: Date of Service: [name] ID M. 11/03/2021 2:15 PM Medical Record Number: [id number] Patient Account Number: [id number] Date of Birth/Sex: Treating RN: 21-May-1936 (86 y.o. M) Primary Care Provider: [name] Other Clinician: Referring Provider: Treating Provider/Extender: Guadalupe Maple, Karrar Weeks in Treatment: 15 Information Obtained from: Patient Chief Complaint 07/15/2021 patient is here for wounds on his bilateral lower legs Electronic Signature(s) Signed: 11/03/2021 1:57:14 PM By: Worthy Keeler PA-C Entered By: Worthy Keeler on 11/03/2021 13:57:14 -------------------------------------------------------------------------------- HPI Details Patient Name: Date of Service: [name] ID M. 11/03/2021 2:15 PM Medical Record Number: [id number] Patient Account Number: [id number] Date of Birth/Sex: Treating RN: [birth year] (86 y.o. M) Primary Care Provider: [name] Other Clinician: Referring Provider: Treating Provider/Extender: Guadalupe Maple, Karrar Weeks in Treatment: 15 History of Present Illness HPI Description: ADMISSION 07/15/2021 This is an 86 year old man who came here for evaluation of wounds on his bilateral predominantly anterior lower legs mid aspect of the tibia. He seems to have been dealing with this for most of this year. Currently with 3 wounds on the right and 2 on the left. At least some of these seem to start off as blisters probably the majority. He has been following with Dr. Anabel Bene of [facility] dermatology according the patient back he has an appointment Dr. Anabel Bene tomorrow he was given a prescription for triamcinolone which she has been applying daily in fact it was renewed yesterday. Patient is a diabetic no recent hemoglobin A1c. He is on Coumadin for persistent atrial fibrillation. Vein and vascular in December 2021 at  which time he had bilateral foot pain and finger pain. ABIs bilaterally were noncompressible however the TBI on the right was only 0.39. There were biphasic waveforms on the left his ABI was noncompressible with biphasic waveforms but with a TBI of 0.6. He was not felt to have a primary vascular issue. There is a mention of Raynaud's phenomenon as well. Past medical history includes cellulitis of the right leg in June 2022, type 2 diabetes, persistent lower extremity edema, bilateral foot and ankle pain, Raynaud's phenomenon, coronary artery disease, congestive heart failure, pacemaker, none sustained ventricular tachycardia, remote CVA paroxysmal atrial fib on Coumadin. We did not reattempt ABIs in our clinic 07/22/2019 patient appears to be doing decently well in regard to his wounds as compared to last week with the wounds that were present last week. Fortunately I do not see any signs of active infection which is great news. No fevers, chills, nausea, vomiting, or diarrhea. With that being said he has several new blisters that are getting need to be addressed today. 07/28/2021 upon evaluation today patient actually appears to be making excellent progress. I am extremely pleased with where we stand today. I think that his wounds are significantly improved compared to where they were previous. 08/04/2021 upon evaluation today patient appears to be doing much better in regard to his wounds in general. I feel like across the board he is showing signs of significant improvement. Fortunately there is no evidence of infection which is great news. No fevers, chills, nausea, vomiting, or diarrhea. 08/11/2021 upon evaluation today patient actually appears to be doing quite well in regard to his wound. Has been tolerating the dressing changes without complication. Fortunately I do not see any signs of infection currently which is great news as well. No fevers, chills, nausea, vomiting, or  diarrhea. 08/18/2021  on evaluation today patient appears to be doing excellent he is making good progress and overall I am extremely pleased with where we stand today. There does not appear to be any signs of active infection at this time which is great news. 09/01/2020 upon evaluation patient's wounds on both the left and the right appear to be at 0.1 cm measurement and are very close to complete resolution. Were not quite completely done yet but this is very close. Overall I am extremely happy with where things stand and I think he is headed in the right direction. 09/08/2021 upon evaluation today patient appears to be doing well in regard to his right leg which is showing signs of being completely healed which is excellent. Fortunately I do not see any evidence of active infection locally nor systemically at this point which is also great news. No fevers, chills, nausea, vomiting, or diarrhea. 09/15/2021; this is a patient we have been following for now a small wound on the left dorsal foot. He had wounds on the right legs and was discharged to 20/30 below-knee stockings apparently in late December. He came in today with marked increase in swelling in the right leg large areas of denuded skin which probably started as blisters. He did not have a stocking on 10/06/2021 upon evaluation today patient appears to be doing decently well in regard to his wounds. On the left leg he is completely healed on the right leg he does still have a small area that is open although again its not as good as it was last time I saw him is also not as bad as it was in the beginning. He has been in the hospital and they did not wrap him as part of the issue I believe here as well. 10/20/2021 upon evaluation today patient appears to be doing well with regard to his leg ulcer. He has been tolerating the dressing changes without complication. Fortunately I do not see any signs of active infection locally or systemically at this  point. No fevers, chills, nausea, vomiting, or diarrhea. 10/27/2021 upon evaluation today patient appears to be doing well with regard to his wound. In fact this appears to be almost completely healed which is great news. Fortunately I do not see any evidence of active infection locally nor systemically which is great news. No fevers, chills, nausea, vomiting, or diarrhea. 11/03/2021 upon evaluation today patient is very close to complete resolution he just has a very tiny area on the right leg still open at this point. He did go get socks from [employer] as I discussed with him at the last visit unfortunately he actually got diabetic socks and compression socks. I explained those are not the same thing and he voiced understanding he is going to go to a different [facility] and try to find exactly what we are looking for. Fortunately I do not see any signs of infection and I think he is very close to discharge will probably be ready for next week. Electronic Signature(s) Signed: 11/03/2021 2:28:35 PM By: Worthy Keeler PA-C Entered By: Worthy Keeler on 11/03/2021 14:28:35 -------------------------------------------------------------------------------- Physical Exam Details Patient Name: Date of Service: [name] ID M. 11/03/2021 2:15 PM Medical Record Number: [id number] Patient Account Number: [id number] Date of Birth/Sex: Treating RN: February 07, 1936 (86 y.o. M) Primary Care Provider: [name] Other Clinician: Referring Provider: Treating Provider/Extender: Guadalupe Maple, Karrar Weeks in Treatment: 74 Constitutional Well-nourished and well-hydrated in no acute distress. Respiratory normal breathing  without difficulty. Psychiatric this patient is able to make decisions and demonstrates good insight into disease process. Alert and Oriented x 3. pleasant and cooperative. Notes Upon inspection patient's wound bed actually showed signs of good granulation and epithelization at this point.  Fortunately I do not see any evidence of active infection locally or systemically which is great news and overall I am very pleased in that regard. Electronic Signature(s) Signed: 11/03/2021 2:28:58 PM By: Worthy Keeler PA-C Entered By: Worthy Keeler on 11/03/2021 14:28:58 -------------------------------------------------------------------------------- Physician Orders Details Patient Name: Date of Service: [name] ID M. 11/03/2021 2:15 PM Medical Record Number: [id number] Patient Account Number: [id number] Date of Birth/Sex: Treating RN: 09-13-1935 (86 y.o. [name] Primary Care Provider: [name] Other Clinician: Referring Provider: Treating Provider/Extender: Guadalupe Maple, Karrar Weeks in Treatment: 15 Verbal / Phone Orders: No Diagnosis Coding ICD-10 Coding Code Description L97.818 Non-pressure chronic ulcer of other part of right lower leg with other specified severity L97.522 Non-pressure chronic ulcer of other part of left foot with fat layer exposed I87.333 Chronic venous hypertension (idiopathic) with ulcer and inflammation of bilateral lower extremity E11.51 Type 2 diabetes mellitus with diabetic peripheral angiopathy without gangrene Follow-up Appointments ppointment in 1 week. - Wednesday with Dr. Celine Ahr Return A Other: - [employer] to pick up support stockings 10-42mHg. Apply to left leg in the morning and remove at night. Bring in the other stocking next week at appt time. Bathing/ Shower/ Hygiene May shower with protection but do not get wound dressing(s) wet. Edema Control - Lymphedema / SCD / Other Bilateral Lower Extremities Elevate legs to the level of the heart or above for 30 minutes daily and/or when sitting, a frequency of: - whenever sitting Avoid standing for long periods of time. Patient to wear own compression stockings every day. Exercise regularly Moisturize legs daily. - Left leg every night before bed. Compression  stocking or Garment 10-20 mm/Hg pressure to: - apply in the morning and remove at night.- purchase at [employer] Additional Orders / Instructions Follow Nutritious Diet - Monitor blood sugars Wound Treatment Wound #10 - Lower Leg Wound Laterality: Right, Circumferential Cleanser: Soap and Water 1 x Per Week/30 Days Discharge Instructions: May shower and wash wound with dial antibacterial soap and water prior to dressing change. Cleanser: Wound Cleanser 1 x Per Week/30 Days Discharge Instructions: Cleanse the wound with wound cleanser prior to applying a clean dressing using gauze sponges, not tissue or cotton balls. Peri-Wound Care: Triamcinolone 15 (g) 1 x Per Week/30 Days Discharge Instructions: Use triamcinolone 15 (g) as directed Peri-Wound Care: Sween Lotion (Moisturizing lotion) 1 x Per Week/30 Days Discharge Instructions: Apply moisturizing lotion as directed Prim Dressing: adaptic 1 x Per Week/30 Days ary Discharge Instructions: apply to wound bed. Secondary Dressing: Woven Gauze Sponge, Non-Sterile 4x4 in 1 x Per Week/30 Days Discharge Instructions: Apply over primary dressing as directed. Compression Wrap: Kerlix Roll 4.5x3.1 (in/yd) 1 x Per Week/30 Days Discharge Instructions: Apply Kerlix and Coban compression as directed. Compression Wrap: Coban Self-Adherent Wrap 4x5 (in/yd) 1 x Per Week/30 Days Discharge Instructions: Apply over Kerlix as directed. Electronic Signature(s) Signed: 11/03/2021 3:53:22 PM By: SWorthy KeelerPA-C Signed: 11/03/2021 4:44:26 PM By: DDeon PillingRN, BSN Entered By: DDeon Pillingon 11/03/2021 14:20:52 -------------------------------------------------------------------------------- Problem List Details Patient Name: Date of Service: DA NHassie Bruce DA [name] ID M. 11/03/2021 2:15 PM Medical Record Number: [id number] Account Number: [id number] of Birth/Sex: Treating RN: 703-15-1937(86y.o. M) Primary  Care Provider: [name] Other  Clinician: Referring Provider: Treating Provider/Extender: Guadalupe Maple, Karrar Weeks in Treatment: 15 Active Problems ICD-10 Encounter Code Description Active Date MDM Diagnosis L97.818 Non-pressure chronic ulcer of other part of right lower leg with other specified 07/15/2021 No Yes severity L97.522 Non-pressure chronic ulcer of other part of left foot with fat layer exposed 08/04/2021 No Yes I87.333 Chronic venous hypertension (idiopathic) with ulcer and inflammation of 07/15/2021 No Yes bilateral lower extremity E11.51 Type 2 diabetes mellitus with diabetic peripheral angiopathy without gangrene 07/15/2021 No Yes Inactive Problems ICD-10 Code Description Active Date Inactive Date L97.828 Non-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 leg with other specified severity 07/15/2021 07/15/2021 Resolved Problems Electronic Signature(s) Signed: 11/03/2021 1:50:22 PM By: Worthy Keeler PA-C Entered By: Worthy Keeler on 11/03/2021 13:50:22 -------------------------------------------------------------------------------- Progress Note Details Patient Name: Date of Service: [name] ID M. 11/03/2021 2:15 PM Medical Record Number: [id number] Patient Account Number: [id number] Date of Birth/Sex: Treating RN: 1936/07/01 (86 y.o. M) Primary Care Provider: [name] Other Clinician: Referring Provider: Treating Provider/Extender: Guadalupe Maple, Karrar Weeks in Treatment: 15 Subjective Chief Complaint Information obtained from Patient 07/15/2021 patient is here for wounds on his bilateral lower legs History of Present Illness (HPI) ADMISSION 07/15/2021 This is an 86 year old man who came here for evaluation of wounds on his bilateral predominantly anterior lower legs mid aspect of the tibia. He seems to have been dealing with this for most of this year. Currently with 3 wounds on the right and 2 on the left. At least some of these seem to start off as  blisters probably the majority. He has been following with Dr. Anabel Bene of [facility] dermatology according the patient back he has an appointment Dr. Anabel Bene tomorrow he was given a prescription for triamcinolone which she has been applying daily in fact it was renewed yesterday. Patient is a diabetic no recent hemoglobin A1c. He is on Coumadin for persistent atrial fibrillation. Vein and vascular in December 2021 at which time he had bilateral foot pain and finger pain. ABIs bilaterally were noncompressible however the TBI on the right was only 0.39. There were biphasic waveforms on the left his ABI was noncompressible with biphasic waveforms but with a TBI of 0.6. He was not felt to have a primary vascular issue. There is a mention of Raynaud's phenomenon as well. Past medical history includes cellulitis of the right leg in June 2022, type 2 diabetes, persistent lower extremity edema, bilateral foot and ankle pain, Raynaud's phenomenon, coronary artery disease, congestive heart failure, pacemaker, none sustained ventricular tachycardia, remote CVA paroxysmal atrial fib on Coumadin. We did not reattempt ABIs in our clinic 07/22/2019 patient appears to be doing decently well in regard to his wounds as compared to last week with the wounds that were present last week. Fortunately I do not see any signs of active infection which is great news. No fevers, chills, nausea, vomiting, or diarrhea. With that being said he has several new blisters that are getting need to be addressed today. 07/28/2021 upon evaluation today patient actually appears to be making excellent progress. I am extremely pleased with where we stand today. I think that his wounds are significantly improved compared to where they were previous. 08/04/2021 upon evaluation today patient appears to be doing much better in regard to his wounds in general. I feel like across the board he is showing signs of significant improvement.  Fortunately there is no evidence of  infection which is great news. No fevers, chills, nausea, vomiting, or diarrhea. 08/11/2021 upon evaluation today patient actually appears to be doing quite well in regard to his wound. Has been tolerating the dressing changes without complication. Fortunately I do not see any signs of infection currently which is great news as well. No fevers, chills, nausea, vomiting, or diarrhea. 08/18/2021 on evaluation today patient appears to be doing excellent he is making good progress and overall I am extremely pleased with where we stand today. There does not appear to be any signs of active infection at this time which is great news. 09/01/2020 upon evaluation patient's wounds on both the left and the right appear to be at 0.1 cm measurement and are very close to complete resolution. Were not quite completely done yet but this is very close. Overall I am extremely happy with where things stand and I think he is headed in the right direction. 09/08/2021 upon evaluation today patient appears to be doing well in regard to his right leg which is showing signs of being completely healed which is excellent. Fortunately I do not see any evidence of active infection locally nor systemically at this point which is also great news. No fevers, chills, nausea, vomiting, or diarrhea. 09/15/2021; this is a patient we have been following for now a small wound on the left dorsal foot. He had wounds on the right legs and was discharged to 20/30 below-knee stockings apparently in late December. He came in today with marked increase in swelling in the right leg large areas of denuded skin which probably started as blisters. He did not have a stocking on 10/06/2021 upon evaluation today patient appears to be doing decently well in regard to his wounds. On the left leg he is completely healed on the right leg he does still have a small area that is open although again its not as good as it was last  time I saw him is also not as bad as it was in the beginning. He has been in the hospital and they did not wrap him as part of the issue I believe here as well. 10/20/2021 upon evaluation today patient appears to be doing well with regard to his leg ulcer. He has been tolerating the dressing changes without complication. Fortunately I do not see any signs of active infection locally or systemically at this point. No fevers, chills, nausea, vomiting, or diarrhea. 10/27/2021 upon evaluation today patient appears to be doing well with regard to his wound. In fact this appears to be almost completely healed which is great news. Fortunately I do not see any evidence of active infection locally nor systemically which is great news. No fevers, chills, nausea, vomiting, or diarrhea. 11/03/2021 upon evaluation today patient is very close to complete resolution he just has a very tiny area on the right leg still open at this point. He did go get socks from [place] as I discussed with him at the last visit unfortunately he actually got diabetic socks and compression socks. I explained those are not the same thing and he voiced understanding he is going to go to a different [employer] and try to find exactly what we are looking for. Fortunately I do not see any signs of infection and I think he is very close to discharge will probably be ready for next week. Objective Constitutional Well-nourished and well-hydrated in no acute distress. Vitals Time Taken: 2:04 PM, Height: 69 in, Weight: 185 lbs, BMI: 27.3, Temperature: 97.3  F, Pulse: 73 bpm, Respiratory Rate: 20 breaths/min, Blood Pressure: 135/68 mmHg, Capillary Blood Glucose: 130 mg/dl. Respiratory normal breathing without difficulty. Psychiatric this patient is able to make decisions and demonstrates good insight into disease process. Alert and Oriented x 3. pleasant and cooperative. General Notes: Upon inspection patient's wound bed actually showed signs of  good granulation and epithelization at this point. Fortunately I do not see any evidence of active infection locally or systemically which is great news and overall I am very pleased in that regard. Integumentary (Hair, Skin) Wound #10 status is Open. Original cause of wound was Blister. The date acquired was: 09/12/2021. The wound has been in treatment 7 weeks. The wound is located on the Right,Circumferential Lower Leg. The wound measures 0.1cm length x 0.1cm width x 0.1cm depth; 0.008cm^2 area and 0.001cm^3 volume. There is no tunneling or undermining noted. There is a none present amount of drainage noted. The wound margin is distinct with the outline attached to the wound base. There is no granulation within the wound bed. There is no necrotic tissue within the wound bed. 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 - Wednesday with Dr. Celine Ahr Other: - [employer] to pick up support stockings 10-52mHg. Apply to left leg in the morning and remove at night. Bring in the other stocking next week at appt time. Bathing/ Shower/ Hygiene: May shower with protection but do not get wound dressing(s) wet. Edema Control - Lymphedema / SCD / Other: Elevate legs to the level of the heart or above for 30 minutes daily and/or when sitting, a frequency of: - whenever sitting Avoid standing for long periods of time. Patient to wear own compression stockings every day. Exercise regularly Moisturize legs daily. - Left leg every night before bed. Compression stocking or Garment 10-20 mm/Hg pressure to: - apply in the morning  and remove at night.- purchase at [employer] Additional Orders / Instructions: Follow Nutritious Diet - Monitor blood sugars WOUND #10: - Lower Leg Wound Laterality: Right, Circumferential Cleanser: Soap and Water 1 x Per Week/30 Days Discharge Instructions: May shower and wash wound with dial antibacterial soap and water prior to dressing change. Cleanser: Wound Cleanser 1 x Per Week/30 Days Discharge Instructions: Cleanse the wound with wound cleanser prior to applying a clean dressing using gauze sponges, not tissue or cotton balls. Peri-Wound Care: Triamcinolone 15 (g) 1 x Per Week/30 Days Discharge Instructions: Use triamcinolone 15 (g) as directed Peri-Wound Care: Sween Lotion (Moisturizing lotion) 1 x Per Week/30 Days Discharge Instructions: Apply moisturizing lotion as directed Prim Dressing: adaptic 1 x Per Week/30 Days ary Discharge Instructions: apply to wound bed. Secondary Dressing: Woven Gauze Sponge, Non-Sterile 4x4 in 1 x Per Week/30 Days Discharge Instructions: Apply over primary dressing as directed. Com pression Wrap: Kerlix Roll 4.5x3.1 (in/yd) 1 x Per Week/30 Days Discharge Instructions: Apply Kerlix and Coban compression as directed. Com pression Wrap: Coban Self-Adherent Wrap 4x5 (in/yd) 1 x Per Week/30 Days Discharge Instructions: Apply over Kerlix as directed. 1. I would recommend that we go ahead and continue with the recommendation for compression wrapping on the right leg. We are going to just use Adaptic over top of the wound area. This  should be perfectly fine and protect it and hopefully by next week she will be ready for discharge. 2. The patient is going to go ahead with the compression wrapping again and this is a Curlex and Coban wrap which I think should continue to perfect for him. 3. He is going to work on getting the compression socks from [place] as we discussed last visit. He got the wrong ones and getting the diabetic socks he is going to go to a  different [employer] and see if he can find them there. We will see patient back for reevaluation in 1 week here in the clinic. If anything worsens or changes patient will contact our office for additional recommendations. Electronic Signature(s) Signed: 11/03/2021 2:29:43 PM By: Worthy Keeler PA-C Entered By: Worthy Keeler on 11/03/2021 14:29:43 -------------------------------------------------------------------------------- SuperBill Details Patient Name: Date of Service: [name] ID M. 11/03/2021 Medical Record Number: [id number] Patient Account Number: [id number] Date of Birth/Sex: Treating RN: 12/14/1935 (86 y.o. [name], [name] Primary Care Provider: [name] Other Clinician: Referring Provider: Treating Provider/Extender: Guadalupe Maple, Karrar Weeks in Treatment: 15 Diagnosis Coding ICD-10 Codes Code Description [phone] Non-pressure chronic ulcer of other part of right lower leg with other specified severity L97.522 Non-pressure chronic ulcer of other part of left foot with fat layer exposed I87.333 Chronic venous hypertension (idiopathic) with ulcer and inflammation of bilateral lower extremity E11.51 Type 2 diabetes mellitus with diabetic peripheral angiopathy without gangrene Facility Procedures CPT4 Code: [id number] Description: 99213 - WOUND CARE VISIT-LEV 3 EST PT Modifier: Quantity: 1 Physician Procedures : CPT4 Code Description Modifier [id number] 30092 - WC PHYS LEVEL 4 - EST PT ICD-10 Diagnosis Description L97.818 Non-pressure chronic ulcer of other part of right lower leg with other specified severity L97.522 Non-pressure chronic ulcer of other part of  left foot with fat layer exposed I87.333 Chronic venous hypertension (idiopathic) with ulcer and inflammation of bilateral lower extremity E11.51 Type 2 diabetes mellitus with diabetic peripheral angiopathy without gangrene Quantity: 1 Electronic Signature(s) Signed: 11/03/2021 2:30:01 PM By: Worthy Keeler PA-C Entered By: Worthy Keeler on 11/03/2021 14:30:01

## 2021-11-03 NOTE — Progress Notes (Signed)
Clymer, Bienvenido M. ([id number]) Visit Report for 11/03/2021 Arrival Information Details Patient Name: Date of Service: [name] ID M. 11/03/2021 2:15 PM Medical Record Number: [id number] Patient Account Number: [id number] Date of Birth/Sex: Treating RN: 11-20-1935 (86 y.o. Vincent [name] Primary Care Zamoria Boss: [name] Other Clinician: Referring Mariluz Crespo: Treating Giannah Zavadil/Extender: Guadalupe Maple, Karrar Weeks in Treatment: 15 Visit Information History Since Last Visit Added or deleted any medications: No Patient Arrived: Ambulatory Any new allergies or adverse reactions: No Arrival Time: 14:04 Had a fall or experienced change in No Accompanied By: self activities of daily living that may affect Transfer Assistance: None risk of falls: Patient Identification Verified: Yes Signs or symptoms of abuse/neglect since last visito No Secondary Verification Process Completed: Yes Hospitalized since last visit: No Patient Requires Transmission-Based Precautions: No Implantable device outside of the clinic excluding No Patient Has Alerts: Yes cellular tissue based products placed in the center since last visit: Has Dressing in Place as Prescribed: Yes Has Compression in Place as Prescribed: Yes Pain Present Now: No Notes patient brought in new socks for Elener Custodio to evaluate. Electronic Signature(s) Signed: 11/03/2021 4:44:26 PM By: Deon Pilling RN, BSN Entered By: Deon Pilling on 11/03/2021 14:07:37 -------------------------------------------------------------------------------- Clinic Level of Care Assessment Details Patient Name: Date of Service: [name], [name] 11/03/2021 2:15 PM Medical Record Number: [id number] Patient Account Number: [id number] Date of Birth/Sex: Treating RN: Apr 14, 1936 (86 y.o. Vincent [name] Primary Care Mehdi Gironda: [name] Other Clinician: Referring Jahmar Mckelvy: Treating Knox Cervi/Extender: Guadalupe Maple, Karrar Weeks in  Treatment: 15 Clinic Level of Care Assessment Items TOOL 4 Quantity Score X- 1 0 Use when only an EandM is performed on FOLLOW-UP visit ASSESSMENTS - Nursing Assessment / Reassessment X- 1 10 Reassessment of Co-morbidities (includes updates in patient status) X- 1 5 Reassessment of Adherence to Treatment Plan ASSESSMENTS - Wound and Skin A ssessment / Reassessment X - Simple Wound Assessment / Reassessment - one wound 1 5 '[]'  - 0 Complex Wound Assessment / Reassessment - multiple wounds X- 1 10 Dermatologic / Skin Assessment (not related to wound area) ASSESSMENTS - Focused Assessment X- 1 5 Circumferential Edema Measurements - multi extremities '[]'  - 0 Nutritional Assessment / Counseling / Intervention '[]'  - 0 Lower Extremity Assessment (monofilament, tuning fork, pulses) '[]'  - 0 Peripheral Arterial Disease Assessment (using hand held doppler) ASSESSMENTS - Ostomy and/or Continence Assessment and Care '[]'  - 0 Incontinence Assessment and Management '[]'  - 0 Ostomy Care Assessment and Management (repouching, etc.) PROCESS - Coordination of Care X - Simple Patient / Family Education for ongoing care 1 15 '[]'  - 0 Complex (extensive) Patient / Family Education for ongoing care X- 1 10 Staff obtains Programmer, systems, Records, T Results / Process Orders est '[]'  - 0 Staff telephones HHA, Nursing Homes / Clarify orders / etc '[]'  - 0 Routine Transfer to another Facility (non-emergent condition) '[]'  - 0 Routine Hospital Admission (non-emergent condition) '[]'  - 0 New Admissions / Biomedical engineer / Ordering NPWT Apligraf, etc. , '[]'  - 0 Emergency Hospital Admission (emergent condition) X- 1 10 Simple Discharge Coordination '[]'  - 0 Complex (extensive) Discharge Coordination PROCESS - Special Needs '[]'  - 0 Pediatric / Minor Patient Management '[]'  - 0 Isolation Patient Management '[]'  - 0 Hearing / Language / Visual special needs '[]'  - 0 Assessment of Community assistance (transportation,  D/C planning, etc.) '[]'  - 0 Additional assistance / Altered mentation '[]'  - 0 Support Surface(s) Assessment (bed, cushion, seat, etc.) INTERVENTIONS -  Wound Cleansing / Measurement X - Simple Wound Cleansing - one wound 1 5 '[]'  - 0 Complex Wound Cleansing - multiple wounds X- 1 5 Wound Imaging (photographs - any number of wounds) '[]'  - 0 Wound Tracing (instead of photographs) X- 1 5 Simple Wound Measurement - one wound '[]'  - 0 Complex Wound Measurement - multiple wounds INTERVENTIONS - Wound Dressings '[]'  - 0 Small Wound Dressing one or multiple wounds '[]'  - 0 Medium Wound Dressing one or multiple wounds X- 1 20 Large Wound Dressing one or multiple wounds '[]'  - 0 Application of Medications - topical '[]'  - 0 Application of Medications - injection INTERVENTIONS - Miscellaneous '[]'  - 0 External ear exam '[]'  - 0 Specimen Collection (cultures, biopsies, blood, body fluids, etc.) '[]'  - 0 Specimen(s) / Culture(s) sent or taken to Lab for analysis '[]'  - 0 Patient Transfer (multiple staff / Civil Service fast streamer / Similar devices) '[]'  - 0 Simple Staple / Suture removal (25 or less) '[]'  - 0 Complex Staple / Suture removal (26 or more) '[]'  - 0 Hypo / Hyperglycemic Management (close monitor of Blood Glucose) '[]'  - 0 Ankle / Brachial Index (ABI) - do not check if billed separately X- 1 5 Vital Signs Has the patient been seen at the hospital within the last three years: Yes Total Score: 110 Level Of Care: New/Established - Level 3 Electronic Signature(s) Signed: 11/03/2021 4:44:26 PM By: Deon Pilling RN, BSN Entered By: Deon Pilling on 11/03/2021 14:21:33 -------------------------------------------------------------------------------- Encounter Discharge Information Details Patient Name: Date of Service: [name], [name] 11/03/2021 2:15 PM Medical Record Number: [id number] Patient Account Number: [id number] Date of Birth/Sex: Treating RN: March 11, 1936 (86 y.o. Vincent [name] Primary Care Jaiveer Panas:  [name] Other Clinician: Referring Hollie Wojahn: Treating Abbott Jasinski/Extender: Guadalupe Maple, Karrar Weeks in Treatment: 15 Encounter Discharge Information Items Discharge Condition: Stable Ambulatory Status: Ambulatory Discharge Destination: Home Transportation: Private Auto Accompanied By: self Schedule Follow-up Appointment: Yes Clinical Summary of Care: Electronic Signature(s) Signed: 11/03/2021 4:44:26 PM By: Deon Pilling RN, BSN Entered By: Deon Pilling on 11/03/2021 14:22:06 -------------------------------------------------------------------------------- Lower Extremity Assessment Details Patient Name: Date of Service: [name], [name] 11/03/2021 2:15 PM Medical Record Number: [id number] Patient Account Number: [id number] Date of Birth/Sex: Treating RN: [birth year] (86 y.o. Vincent [name] Primary Care Addelynn Batte: [name] Other Clinician: Referring Alley Neils: Treating Raizel Wesolowski/Extender: Guadalupe Maple, Karrar Weeks in Treatment: 15 Edema Assessment Assessed: [Left: No] Patrice Paradise: Yes] Edema: [Left: No] [Right: No] Calf Left: Right: Point of Measurement: 31 cm From Medial Instep 34.5 cm Ankle Left: Right: Point of Measurement: 11 cm From Medial Instep 22 cm Vascular Assessment Pulses: Dorsalis Pedis Palpable: [Right:Yes] Electronic Signature(s) Signed: 11/03/2021 4:44:26 PM By: Deon Pilling RN, BSN Entered By: Deon Pilling on 11/03/2021 14:10:11 -------------------------------------------------------------------------------- [place] Details Patient Name: Date of Service: [name], [name] 11/03/2021 2:15 PM Medical Record Number: [id number] Patient Account Number: [id number] Date of Birth/Sex: Treating RN: 1936-08-20 (86 y.o. Vincent [name] Primary Care Chaquana Nichols: [name] Other Clinician: Referring Julietta Batterman: Treating Preston Garabedian/Extender: Veatrice Bourbon in Treatment: [place] reviewed with physician Active Inactive Nutrition Nursing Diagnoses: Impaired glucose control: actual or potential Potential for alteratiion in Nutrition/Potential for imbalanced nutrition Goals: Patient/caregiver will maintain therapeutic glucose control Date Initiated: 07/15/2021 Target Resolution Date: 11/19/2021 Goal Status: Active Interventions: Assess HgA1c results as ordered upon admission and as needed Assess patient nutrition upon admission and as needed per policy Provide  education on elevated blood sugars and impact on wound healing Treatment Activities: Dietary management education, guidance and counseling : 07/15/2021 Giving encouragement to exercise : 07/15/2021 Patient referred to Primary Care Physician for further nutritional evaluation : 07/15/2021 Notes: 08/11/21: Glucose control ongoing 09/15/20: Glucose control continues Venous Leg Ulcer Nursing Diagnoses: Knowledge deficit related to disease process and management Potential for venous Insuffiency (use before diagnosis confirmed) Goals: Patient will maintain optimal edema control Date Initiated: 07/15/2021 Target Resolution Date: 11/20/2021 Goal Status: Active Interventions: Assess peripheral edema status every visit. Compression as ordered Provide education on venous insufficiency Treatment Activities: Therapeutic compression applied : 07/15/2021 Notes: 08/11/21: Edema control ongoing 09/15/20: Edema control continues with wraps Wound/Skin Impairment Nursing Diagnoses: Impaired tissue integrity Knowledge deficit related to ulceration/compromised skin integrity Goals: Patient/caregiver will verbalize understanding of skin care regimen Date Initiated: 07/15/2021 Target Resolution Date: 11/19/2021 Goal Status: Active Ulcer/skin breakdown will have a volume reduction of 30% by week 4 Date Initiated: 07/15/2021 Date Inactivated: 08/11/2021 Target Resolution Date: 08/12/2021 Goal Status:  Met Ulcer/skin breakdown will have a volume reduction of 50% by week 8 Date Initiated: 08/11/2021 Date Inactivated: 10/06/2021 Target Resolution Date: 09/15/2021 Goal Status: Met Ulcer/skin breakdown will have a volume reduction of 80% by week 12 Date Initiated: 10/06/2021 Target Resolution Date: 11/03/2021 Goal Status: Active Interventions: Assess patient/caregiver ability to obtain necessary supplies Assess patient/caregiver ability to perform ulcer/skin care regimen upon admission and as needed Assess ulceration(s) every visit Provide education on ulcer and skin care Treatment Activities: Skin care regimen initiated : 07/15/2021 Topical wound management initiated : 07/15/2021 Notes: 09/15/20: Wound care regimen continues, patient compliant. Electronic Signature(s) Signed: 11/03/2021 4:44:26 PM By: Deon Pilling RN, BSN Entered By: Deon Pilling on 11/03/2021 14:21:04 -------------------------------------------------------------------------------- Pain Assessment Details Patient Name: Date of Service: [name], [name] 11/03/2021 2:15 PM Medical Record Number: [id number] Patient Account Number: [id number] Date of Birth/Sex: Treating RN: [birth year] (86 y.o. Vincent [name] Primary Care Christophe Rising: [name] Other Clinician: Referring Jonda Alanis: Treating Terriyah Westra/Extender: Guadalupe Maple, Karrar Weeks in Treatment: 15 Active Problems Location of Pain Severity and Description of Pain Patient Has Paino No Site Locations Rate the pain. Rate the pain. Current Pain Level: 0 Pain Management and Medication Current Pain Management: Medication: No Cold Application: No Rest: No Massage: No Activity: No T.E.N.S.: No Heat Application: No Leg drop or elevation: No Is the Current Pain Management Adequate: Adequate How does your wound impact your activities of daily livingo Sleep: No Bathing: No Appetite: No Relationship With Others: No Bladder Continence: No Emotions:  No Bowel Continence: No Work: No Toileting: No Drive: No Dressing: No Hobbies: No Engineer, maintenance) Signed: 11/03/2021 4:44:26 PM By: Deon Pilling RN, BSN Entered By: Deon Pilling on 11/03/2021 14:08:09 -------------------------------------------------------------------------------- Patient/Caregiver Education Details Patient Name: Date of Service: DA Burgess Estelle 3/8/2023andnbsp2:15 PM Medical Record Number: [id number] Patient Account Number: [id number] Date of Birth/Gender: Treating RN: 12-Oct-1935 (86 y.o. Vincent [name] Primary Care Physician: [name] Other Clinician: Referring Physician: Treating Physician/Extender: Veatrice Bourbon in Treatment: 15 Education Assessment Education Provided To: Patient Education Topics Provided Wound/Skin Impairment: Handouts: Skin Care Do's and Dont's Methods: Explain/Verbal Responses: Reinforcements needed Electronic Signature(s) Signed: 11/03/2021 4:44:26 PM By: Deon Pilling RN, BSN Entered By: Deon Pilling on 11/03/2021 14:12:17 -------------------------------------------------------------------------------- Wound Assessment Details Patient Name: Date of Service: [name], Shaune Pascal ID M. 11/03/2021 2:15 PM Medical Record Number: [id number] Patient Account Number: [id number] Date of Birth/Sex: Treating RN: January 30, 1936 (85  y.o. [name], [name] Primary Care Tabathia Knoche: [name] Other Clinician: Referring Saige Canton: Treating Cherilyn Sautter/Extender: Guadalupe Maple, Karrar Weeks in Treatment: 15 Wound Status Wound Number: 10 Primary Venous Leg Ulcer Etiology: Wound Location: Right, Circumferential Lower Leg Wound Open Wounding Event: Blister Status: Date Acquired: 09/12/2021 Comorbid Cataracts, Glaucoma, Chronic Obstructive Pulmonary Disease Weeks Of Treatment: 7 History: (COPD), Arrhythmia, Congestive Heart Failure, Coronary Artery Clustered Wound: No Disease, Hypertension, Type II  Diabetes, Gout, Neuropathy Photos Wound Measurements Length: (cm) 0.1 Width: (cm) 0.1 Depth: (cm) 0.1 Area: (cm) 0.008 Volume: (cm) 0.001 % Reduction in Area: 100% % Reduction in Volume: 100% Epithelialization: Large (67-100%) Tunneling: No Undermining: No Wound Description Classification: Full Thickness Without Exposed Support Structures Wound Margin: Distinct, outline attached Exudate Amount: None Present Foul Odor After Cleansing: No Slough/Fibrino No Wound Bed Granulation Amount: None Present (0%) Exposed Structure Necrotic Amount: None Present (0%) Fascia Exposed: No Fat Layer (Subcutaneous Tissue) Exposed: No Tendon Exposed: No Muscle Exposed: No Joint Exposed: No Bone Exposed: No Treatment Notes Wound #10 (Lower Leg) Wound Laterality: Right, Circumferential Cleanser Soap and Water Discharge Instruction: May shower and wash wound with dial antibacterial soap and water prior to dressing change. Wound Cleanser Discharge Instruction: Cleanse the wound with wound cleanser prior to applying a clean dressing using gauze sponges, not tissue or cotton balls. Peri-Wound Care Triamcinolone 15 (g) Discharge Instruction: Use triamcinolone 15 (g) as directed Sween Lotion (Moisturizing lotion) Discharge Instruction: Apply moisturizing lotion as directed Topical Primary Dressing adaptic Discharge Instruction: apply to wound bed. Secondary Dressing Woven Gauze Sponge, Non-Sterile 4x4 in Discharge Instruction: Apply over primary dressing as directed. Secured With Compression Wrap Kerlix Roll 4.5x3.1 (in/yd) Discharge Instruction: Apply Kerlix and Coban compression as directed. Coban Self-Adherent Wrap 4x5 (in/yd) Discharge Instruction: Apply over Kerlix as directed. Compression Stockings Add-Ons Electronic Signature(s) Signed: 11/03/2021 4:44:26 PM By: Deon Pilling RN, BSN Entered By: Deon Pilling on 11/03/2021  14:11:28 -------------------------------------------------------------------------------- Vitals Details Patient Name: Date of Service: [name], [name] 11/03/2021 2:15 PM Medical Record Number: [id number] Patient Account Number: [id number] Date of Birth/Sex: Treating RN: 04/18/1936 (87 y.o. Vincent [name] Primary Care Devri Kreher: [name] Other Clinician: Referring Icess Bertoni: Treating Koury Roddy/Extender: Guadalupe Maple, Karrar Weeks in Treatment: 15 Vital Signs Time Taken: 14:04 Temperature (F): 97.3 Height (in): 69 Pulse (bpm): 73 Weight (lbs): 185 Respiratory Rate (breaths/min): 20 Body Mass Index (BMI): 27.3 Blood Pressure (mmHg): 135/68 Capillary Blood Glucose (mg/dl): 130 Reference Range: 80 - 120 mg / dl Electronic Signature(s) Signed: 11/03/2021 4:44:26 PM By: Deon Pilling RN, BSN Entered By: Deon Pilling on 11/03/2021 14:08:13

## 2021-11-10 ENCOUNTER — Other Ambulatory Visit: Payer: Self-pay

## 2021-11-10 ENCOUNTER — Encounter (HOSPITAL_BASED_OUTPATIENT_CLINIC_OR_DEPARTMENT_OTHER): Payer: Medicare Other | Admitting: General Surgery

## 2021-11-10 DIAGNOSIS — E1151 Type 2 diabetes mellitus with diabetic peripheral angiopathy without gangrene: Secondary | ICD-10-CM | POA: Diagnosis not present

## 2021-11-10 DIAGNOSIS — E11621 Type 2 diabetes mellitus with foot ulcer: Secondary | ICD-10-CM | POA: Diagnosis not present

## 2021-11-10 DIAGNOSIS — I2510 Atherosclerotic heart disease of native coronary artery without angina pectoris: Secondary | ICD-10-CM | POA: Diagnosis not present

## 2021-11-10 DIAGNOSIS — Z950 Presence of cardiac pacemaker: Secondary | ICD-10-CM | POA: Diagnosis not present

## 2021-11-10 DIAGNOSIS — I509 Heart failure, unspecified: Secondary | ICD-10-CM | POA: Diagnosis not present

## 2021-11-10 DIAGNOSIS — I7300 Raynaud's syndrome without gangrene: Secondary | ICD-10-CM | POA: Diagnosis not present

## 2021-11-10 DIAGNOSIS — L97818 Non-pressure chronic ulcer of other part of right lower leg with other specified severity: Secondary | ICD-10-CM | POA: Diagnosis not present

## 2021-11-10 DIAGNOSIS — I110 Hypertensive heart disease with heart failure: Secondary | ICD-10-CM | POA: Diagnosis not present

## 2021-11-10 DIAGNOSIS — L97522 Non-pressure chronic ulcer of other part of left foot with fat layer exposed: Secondary | ICD-10-CM | POA: Diagnosis not present

## 2021-11-10 DIAGNOSIS — I87333 Chronic venous hypertension (idiopathic) with ulcer and inflammation of bilateral lower extremity: Secondary | ICD-10-CM | POA: Diagnosis not present

## 2021-11-10 DIAGNOSIS — I4819 Other persistent atrial fibrillation: Secondary | ICD-10-CM | POA: Diagnosis not present

## 2021-11-10 DIAGNOSIS — Z8673 Personal history of transient ischemic attack (TIA), and cerebral infarction without residual deficits: Secondary | ICD-10-CM | POA: Diagnosis not present

## 2021-11-10 DIAGNOSIS — Z7901 Long term (current) use of anticoagulants: Secondary | ICD-10-CM | POA: Diagnosis not present

## 2021-11-10 DIAGNOSIS — E11622 Type 2 diabetes mellitus with other skin ulcer: Secondary | ICD-10-CM | POA: Diagnosis not present

## 2021-11-10 NOTE — Progress Notes (Signed)
Slates, Alpheus M. ([id number]) ?Visit Report for 11/10/2021 ?Chief Complaint Document Details ?Patient Name: Date of Service: ?Vincent [name], Vincent Pascal ID M. 11/10/2021 2:15 PM ?Medical Record Number: [id number] ?Patient Account Number: [id number] ?Date of Birth/Sex: Treating RN: ?1936/06/03 (86 y.o. M) ?Primary Care Provider: [name] Other Clinician: ?Referring Provider: ?Treating Provider/Extender: Fredirick Maudlin ?[name] ?Weeks in Treatment: 16 ?Information Obtained from: Patient ?Chief Complaint ?07/15/2021 patient is here for wounds on his bilateral lower legs ?Electronic Signature(s) ?Signed: 11/10/2021 5:46:02 PM By: Fredirick Maudlin MD FACS ?Entered By: Fredirick Maudlin on 11/10/2021 17:46:02 ?-------------------------------------------------------------------------------- ?HPI Details ?Patient Name: Date of Service: ?Vincent [name], Vincent Pascal ID M. 11/10/2021 2:15 PM ?Medical Record Number: [id number] ?Patient Account Number: [id number] ?Date of Birth/Sex: Treating RN: ?10-11-1935 (86 y.o. M) ?Primary Care Provider: [name] Other Clinician: ?Referring Provider: ?Treating Provider/Extender: Fredirick Maudlin ?[name] ?Weeks in Treatment: 16 ?History of Present Illness ?HPI Description: ADMISSION ?07/15/2021 ?This is an 86 year old man who came here for evaluation of wounds on his bilateral predominantly anterior lower legs mid aspect of the tibia. He seems to have ?been dealing with this for most of this year. Currently with 3 wounds on the right and 2 on the left. At least some of these seem to start off as blisters ?probably the majority. He has been following with Dr. Anabel Bene of [facility] dermatology according the patient back he has an appointment Dr. Anabel Bene ?tomorrow he was given a prescription for triamcinolone which she has been applying daily in fact it was renewed yesterday. Patient is a diabetic no recent ?hemoglobin A1c. He is on Coumadin for persistent atrial fibrillation. ?Vein and vascular in  December 2021 at which time he had bilateral foot pain and finger pain. ABIs bilaterally were noncompressible however the TBI on the right ?was only 0.39. There were biphasic waveforms on the left his ABI was noncompressible with biphasic waveforms but with a TBI of 0.6. He was not felt to have ?a primary vascular issue. There is a mention of Raynaud's phenomenon as well. ?Past medical history includes cellulitis of the right leg in June 2022, type 2 diabetes, persistent lower extremity edema, bilateral foot and ankle pain, Raynaud's ?phenomenon, coronary artery disease, congestive heart failure, pacemaker, none sustained ventricular tachycardia, remote CVA paroxysmal atrial fib on ?Coumadin. ?We did not reattempt ABIs in our clinic ?07/22/2019 patient appears to be doing decently well in regard to his wounds as compared to last week with the wounds that were present last week. Fortunately ?I do not see any signs of active infection which is great news. No fevers, chills, nausea, vomiting, or diarrhea. With that being said he has several new blisters ?that are getting need to be addressed today. ?07/28/2021 upon evaluation today patient actually appears to be making excellent progress. I am extremely pleased with where we stand today. I think that his ?wounds are significantly improved compared to where they were previous. ?08/04/2021 upon evaluation today patient appears to be doing much better in regard to his wounds in general. I feel like across the board he is showing signs of ?significant improvement. Fortunately there is no evidence of infection which is great news. No fevers, chills, nausea, vomiting, or diarrhea. ?08/11/2021 upon evaluation today patient actually appears to be doing quite well in regard to his wound. Has been tolerating the dressing changes without ?complication. Fortunately I do not see any signs of infection currently which is great news as well. No fevers, chills, nausea, vomiting, or  diarrhea. ?08/18/2021 on evaluation today  patient appears to be doing excellent he is making good progress and overall I am extremely pleased with where we stand ?today. There does not appear to be any signs of active infection at this time which is great news. ?09/01/2020 upon evaluation patient's wounds on both the left and the right appear to be at 0.1 cm measurement and are very close to complete resolution. Were ?not quite completely done yet but this is very close. Overall I am extremely happy with where things stand and I think he is headed in the right direction. ?09/08/2021 upon evaluation today patient appears to be doing well in regard to his right leg which is showing signs of being completely healed which is excellent. ?Fortunately I do not see any evidence of active infection locally nor systemically at this point which is also great news. No fevers, chills, nausea, vomiting, or ?diarrhea. ?09/15/2021; this is a patient we have been following for now a small wound on the left dorsal foot. He had wounds on the right legs and was discharged to 20/30 ?below-knee stockings apparently in late December. He came in today with marked increase in swelling in the right leg large areas of denuded skin which ?probably started as blisters. He did not have a stocking on ?10/06/2021 upon evaluation today patient appears to be doing decently well in regard to his wounds. On the left leg he is completely healed on the right leg he ?does still have a small area that is open although again its not as good as it was last time I saw him is also not as bad as it was in the beginning. He has been ?in the hospital and they did not wrap him as part of the issue I believe here as well. ?10/20/2021 upon evaluation today patient appears to be doing well with regard to his leg ulcer. He has been tolerating the dressing changes without complication. ?Fortunately I do not see any signs of active infection locally or systemically at this  point. No fevers, chills, nausea, vomiting, or diarrhea. ?10/27/2021 upon evaluation today patient appears to be doing well with regard to his wound. In fact this appears to be almost completely healed which is great ?news. Fortunately I do not see any evidence of active infection locally nor systemically which is great news. No fevers, chills, nausea, vomiting, or diarrhea. ?11/03/2021 upon evaluation today patient is very close to complete resolution he just has a very tiny area on the right leg still open at this point. He did go get ?socks from [employer] as I discussed with him at the last visit unfortunately he actually got diabetic socks and compression socks. I explained those are not the ?same thing and he voiced understanding he is going to go to a different [employer] and try to find exactly what we are looking for. Fortunately I do not see any ?signs of infection and I think he is very close to discharge will probably be ready for next week. ?11/10/2021: T oday the patient's right lower extremity wound is closed. He was able to get the low grade compression socks from [employer] and is wearing 1 on ?the left leg. ?Electronic Signature(s) ?Signed: 11/10/2021 5:46:57 PM By: Fredirick Maudlin MD FACS ?Entered By: Fredirick Maudlin on 11/10/2021 17:46:57 ?-------------------------------------------------------------------------------- ?Physical Exam Details ?Patient Name: Date of Service: ?Vincent [name], Vincent Pascal ID M. 11/10/2021 2:15 PM ?Medical Record Number: [id number] ?Patient Account Number: [id number] ?Date of Birth/Sex: Treating RN: ?Jul 16, 1936 (86 y.o. M) ?Primary Care Provider: [name],  [place] Ar Other Clinician: ?Referring Provider: ?Treating Provider/Extender: Fredirick Maudlin ?[name] ?Weeks in Treatment: 16 ?Constitutional ?He is hypertensive, but asymptomatic.. . . . No acute distress. ?Respiratory ?Normal work of breathing on room air. ?Notes ?11/10/2021: Wound is closed ?Electronic Signature(s) ?Signed: 11/10/2021 5:48:27  PM By: Fredirick Maudlin MD FACS ?Entered By: Fredirick Maudlin on 11/10/2021 17:48:27 ?-------------------------------------------------------------------------------- ?Physician Orders Details ?Patient Name: Vincent [name]

## 2021-11-11 ENCOUNTER — Encounter (HOSPITAL_COMMUNITY): Payer: Self-pay | Admitting: Cardiology

## 2021-11-11 ENCOUNTER — Ambulatory Visit (HOSPITAL_COMMUNITY)
Admission: RE | Admit: 2021-11-11 | Discharge: 2021-11-11 | Disposition: A | Discharge status: Home / Self Care | Payer: Medicare Other | Source: Ambulatory Visit | Attending: Cardiology | Admitting: Cardiology

## 2021-11-11 VITALS — BP 140/70 | HR 55 | Wt 178.4 lb

## 2021-11-11 DIAGNOSIS — Z823 Family history of stroke: Secondary | ICD-10-CM | POA: Insufficient documentation

## 2021-11-11 DIAGNOSIS — E11621 Type 2 diabetes mellitus with foot ulcer: Secondary | ICD-10-CM | POA: Insufficient documentation

## 2021-11-11 DIAGNOSIS — Z955 Presence of coronary angioplasty implant and graft: Secondary | ICD-10-CM | POA: Insufficient documentation

## 2021-11-11 DIAGNOSIS — I509 Heart failure, unspecified: Secondary | ICD-10-CM

## 2021-11-11 DIAGNOSIS — I4821 Permanent atrial fibrillation: Secondary | ICD-10-CM | POA: Diagnosis not present

## 2021-11-11 DIAGNOSIS — I2510 Atherosclerotic heart disease of native coronary artery without angina pectoris: Secondary | ICD-10-CM | POA: Diagnosis not present

## 2021-11-11 DIAGNOSIS — L97509 Non-pressure chronic ulcer of other part of unspecified foot with unspecified severity: Secondary | ICD-10-CM | POA: Diagnosis not present

## 2021-11-11 DIAGNOSIS — Z79899 Other long term (current) drug therapy: Secondary | ICD-10-CM | POA: Diagnosis not present

## 2021-11-11 DIAGNOSIS — I5022 Chronic systolic (congestive) heart failure: Secondary | ICD-10-CM

## 2021-11-11 DIAGNOSIS — E782 Mixed hyperlipidemia: Secondary | ICD-10-CM | POA: Diagnosis not present

## 2021-11-11 DIAGNOSIS — I5023 Acute on chronic systolic (congestive) heart failure: Secondary | ICD-10-CM

## 2021-11-11 DIAGNOSIS — Z7901 Long term (current) use of anticoagulants: Secondary | ICD-10-CM | POA: Diagnosis not present

## 2021-11-11 DIAGNOSIS — Z8673 Personal history of transient ischemic attack (TIA), and cerebral infarction without residual deficits: Secondary | ICD-10-CM | POA: Diagnosis not present

## 2021-11-11 DIAGNOSIS — R9431 Abnormal electrocardiogram [ECG] [EKG]: Secondary | ICD-10-CM | POA: Diagnosis not present

## 2021-11-11 DIAGNOSIS — Z7984 Long term (current) use of oral hypoglycemic drugs: Secondary | ICD-10-CM | POA: Diagnosis not present

## 2021-11-11 DIAGNOSIS — Z8249 Family history of ischemic heart disease and other diseases of the circulatory system: Secondary | ICD-10-CM | POA: Diagnosis not present

## 2021-11-11 LAB — BASIC METABOLIC PANEL
Anion gap: 8 (ref 5–15)
BUN: 13 mg/dL (ref 8–23)
CO2: 29 mmol/L (ref 22–32)
Calcium: 8.8 mg/dL — ABNORMAL LOW (ref 8.9–10.3)
Chloride: 104 mmol/L (ref 98–111)
Creatinine, Ser: 0.92 mg/dL (ref 0.61–1.24)
GFR, Estimated: >60 mL/min (ref >60)
Glucose, Bld: 97 mg/dL (ref 70–99)
Potassium: 4.1 mmol/L (ref 3.5–5.1)
Sodium: 141 mmol/L (ref 135–145)

## 2021-11-11 LAB — CBC
HCT: 38.3 % — ABNORMAL LOW (ref 39.0–52.0)
Hemoglobin: 11.8 g/dL — ABNORMAL LOW (ref 13.0–17.0)
MCH: 26.6 pg (ref 26.0–34.0)
MCHC: 30.8 g/dL (ref 30.0–36.0)
MCV: 86.3 fL (ref 80.0–100.0)
Platelets: 188 10*3/uL (ref 150–400)
RBC: 4.44 MIL/uL (ref 4.22–5.81)
RDW: 16.1 % — ABNORMAL HIGH (ref 11.5–15.5)
WBC: 3.6 10*3/uL — ABNORMAL LOW (ref 4.0–10.5)
nRBC: 0 % (ref 0.0–0.2)

## 2021-11-11 MED ORDER — CARVEDILOL 3.125 MG PO TABS: 6.2500 mg | ORAL_TABLET | Freq: Two times a day (BID) | ORAL | 5 refills | Status: DC

## 2021-11-11 MED ORDER — SACUBITRIL-VALSARTAN 49-51 MG PO TABS: 1.0000 | ORAL_TABLET | Freq: Two times a day (BID) | ORAL | 5 refills | Status: DC

## 2021-11-11 NOTE — Progress Notes (Signed)
? ? ? ?Primary Care: Dr [name]  ?HF Cardiology: Dr. Aundra Dubin ? ?Vincent [name] is 86 y.o. with history of HFrEF, permanent AF, CAD, T2DM, CVA, left foot droop, and chronic wounds referred from [facility] clinic for CHF evaluation.  He is a retired Chief Executive Officer.  ? ?Admitted in 1/23 with increased shortness of breath in the setting of acute heart failure. On arrival EKG showed A fib/RVR with rate in the 100s. Diuresed with IV lasix. Echo completed with EF 25%. Had cath with borderline obstructive disease in the distal LCx, patent stent in the mid RCA. No change compared to 2016 cath. GDMT started.  ? ?He has had chronic lower extremity wounds and has been followed at the wound clinic, now healed.  ? ?He is doing relatively well currently.  He remains in atrial fibrillation today.  No dyspnea walking on flat ground.  He does get mildly short of breath walking up a hill.  No orthopnea/PND.  No chest pain.  No BRBPR/melena. He is out of [place] today (just ran out). Chronic left-sided weakness from CVA but does not walk with a cane and is not particularly unstable.  ? ?ECG (pseronally reviewed): Atrial fibrillation, LVH with repolarization abnormality.  ? ?Labs (1/23): LDL 77 ?Labs (2/23): K 4.1, creatinine 1.13 ? ?PMH: ?1. CVA: Related to atrial fibrillation.  ?2. Atrial fibrillation: Permanent.  ?3. Chronic lower extremity wounds =>? Venous stasis, resolved.  ?4. Chronic systolic CHF: Suspect primarily nonischemic cardiomyopathy.  ?- Echo (2015): EF 40-45%.  ?- Echo (1/23): EF 35%, normal RV, mild-moderate Vincent, moderate TR.  ?- LHC/RHC (1/23): 70% distal LCx stenosis, patent RCA stent, 50% mRCA; mean PCWP 22, CI 2.67.  ?5. CAD: PCI to RCA remotely. ?- LHC in 1/23 with 70% distal LCx stenosis, patent RCA stent, 50% mRCA ? ?Review of Systems: All systems reviewed and negative except as per HPI.  ? ?Current Outpatient Medications  ?Medication Sig Dispense Refill  ? ACCU-CHEK FASTCLIX LANCETS MISC USE TO CHECK BLOOD SUGAR QD  6  ? ACCU-CHEK  GUIDE test strip USE TO CHECK BLOOD SUGAR QD  6  ? acetaminophen (TYLENOL) 500 MG tablet Take 2 tablets (1,000 mg total) by mouth every 6 (six) hours as needed. 30 tablet 0  ? apixaban (ELIQUIS) 5 MG TABS tablet Take 1 tablet (5 mg total) by mouth 2 (two) times daily. 60 tablet 5  ? atorvastatin (LIPITOR) 80 MG tablet Take 1 tablet (80 mg total) by mouth daily. 30 tablet 5  ? brimonidine (ALPHAGAN) 0.2 % ophthalmic solution 1 drop into affected eye    ? calcium-vitamin D (OSCAL WITH D) 250-125 MG-UNIT tablet Take 1 tablet by mouth daily.    ? Continuous Blood Gluc Sensor (FREESTYLE LIBRE 14 DAY SENSOR) MISC USE AS DIRECTED EVERY 14 DAYS.    ? Cyanocobalamin (VITAMIN B-12 CR PO) Take 1 tablet by mouth daily.    ? digoxin (LANOXIN) 0.125 MG tablet Take 1 tablet (0.125 mg total) by mouth daily. 30 tablet 5  ? famotidine (PEPCID) 20 MG tablet Take 20 mg by mouth 2 (two) times daily.    ? Ferrous Sulfate (IRON PO) Take 1 tablet by mouth daily.    ? furosemide (LASIX) 20 MG tablet Take 2 tablets (40 mg total) by mouth daily. 60 tablet 5  ? isosorbide mononitrate (IMDUR) 30 MG 24 hr tablet Take 1 tablet (30 mg total) by mouth daily. 90 tablet 3  ? JARDIANCE 25 MG TABS tablet Take 25 mg by mouth daily.    ?  loratadine (CLARITIN) 10 MG tablet Take 10 mg by mouth daily.    ? Magnesium 250 MG TABS Take 1 tablet by mouth 2 (two) times daily.    ? meclizine (ANTIVERT) 25 MG tablet Take 25 mg by mouth 3 (three) times daily as needed for dizziness.    ? metFORMIN (GLUCOPHAGE) 500 MG tablet Take 500 mg by mouth 2 (two) times daily.    ? nitroGLYCERIN (NITROLINGUAL) 0.4 MG/SPRAY spray Place 1 spray under the tongue every 5 (five) minutes x 3 doses as needed for chest pain. Please call and schedule an appointment 12 g 0  ? Omega-3 Fatty Acids (OMEGA-3 FISH OIL PO) Take 1 capsule by mouth daily.    ? ROCKLATAN 0.02-0.005 % SOLN Apply 1 drop to eye at bedtime.    ? spironolactone (ALDACTONE) 25 MG tablet TAKE 1 TABLET(25 MG) BY MOUTH  DAILY 90 tablet 3  ? carvedilol (COREG) 3.125 MG tablet Take 2 tablets (6.25 mg total) by mouth 2 (two) times daily with a meal. 60 tablet 5  ? sacubitril-valsartan (ENTRESTO) 49-51 MG Take 1 tablet by mouth 2 (two) times daily. 60 tablet 5  ? ?No current facility-administered medications for this encounter.  ? ? ?No Known Allergies ? ?  ?Social History  ? ?Socioeconomic History  ? Marital status: Divorced  ?  Spouse name: Not on file  ? Number of children: 0  ? Years of education: master's  ? Highest education level: Master's degree (e.g., MA, MS, MEng, MEd, MSW, MBA)  ?Occupational History  ? Occupation: semi-retired  ?Tobacco Use  ? Smoking status: Former  ?  Packs/day: 0.50  ?  Years: 15.00  ?  Pack years: 7.50  ?  Types: Cigarettes  ?  Quit date: 08/22/1983  ?  Years since quitting: 38.2  ? Smokeless tobacco: Never  ?Vaping Use  ? Vaping Use: Never used  ?Substance and Sexual Activity  ? Alcohol use: No  ? Drug use: No  ? Sexual activity: Not on file  ?Other Topics Concern  ? Not on file  ?Social History Narrative  ? Patient drinks 1-2 cups of caffeine daily.  ? Patient is right handed.  ? ?Social Determinants of Health  ? ?Financial Resource Strain: Low Risk   ? Difficulty of Paying Living Expenses: Not very hard  ?Food Insecurity: No Food Insecurity  ? Worried About Charity fundraiser in the Last Year: Never true  ? Ran Out of Food in the Last Year: Never true  ?Transportation Needs: No Transportation Needs  ? Lack of Transportation (Medical): No  ? Lack of Transportation (Non-Medical): No  ?Physical Activity: Not on file  ?Stress: Not on file  ?Social Connections: Not on file  ?Intimate Partner Violence: Not on file  ? ? ?  ?Family History  ?Problem Relation Age of Onset  ? Colon cancer Mother   ? Hypertension Mother   ? Cancer Mother   ? Prostate cancer Brother   ? Cancer Brother   ? Heart disease Father   ? Heart attack Father   ? Hypertension Father   ? Heart attack Sister   ? Cancer Sister   ? Stroke  Neg Hx   ? Colon polyps Neg Hx   ? Esophageal cancer Neg Hx   ? Stomach cancer Neg Hx   ? Rectal cancer Neg Hx   ? Pancreatic cancer Neg Hx   ? ? ?Vitals:  ? 11/11/21 1132  ?BP: 140/70  ?Pulse: (!) 55  ?SpO2:  96%  ?Weight: 80.9 kg (178 lb 6.4 oz)  ? ?Wt Readings from Last 3 Encounters:  ?11/11/21 80.9 kg (178 lb 6.4 oz)  ?10/19/21 78.8 kg (173 lb 12.8 oz)  ?09/29/21 76.7 kg (169 lb 2 oz)  ? ? ?PHYSICAL EXAM: ?General: NAD ?Neck: No JVD, no thyromegaly or thyroid nodule.  ?Lungs: Clear to auscultation bilaterally with normal respiratory effort. ?CV: Nondisplaced PMI.  Heart irregular S1/S2, no S3/S4, no murmur.  No peripheral edema.  No carotid bruit.  Difficult to palpate pedal pulses.  ?Abdomen: Soft, nontender, no hepatosplenomegaly, no distention.  ?Skin: Intact without lesions or rashes.  ?Neurologic: Alert and oriented x 3.  ?Psych: Normal affect. ?Extremities: No clubbing or cyanosis.  ?HEENT: Normal.  ? ?ASSESSMENT & PLAN: ?1. Chronic systolic CHF: Probably predominantly NICM. LHC in 1/23 with patent RCA stent and 70% distal LCx stenosis.  Echo in 1/23 with EF 25%, mild-moderate Vincent and moderate TR.  Cardiomyopathy could be due to long-standing (permanent) atrial fibrillation.  No strong FH of CMP.  He denies heavy ETOH use. NYHA class II symptoms, he is not volume overloaded on exam.  ?- Continue digoxin, check level.  ?- Increase Coreg to 6.25 mg bid.  HR in upper 50s today, hopefully will tolerate slightly higher dose.  ?- Restart Entresto 49/51 bid.  ?- Continue Jardiance 25 mg daily.  ?- Continue spironolactone 25 mg daily.  BMET today.  ?- Continue Lasix 40 mg daily.  ?- Narrow QRS, not CRT candidate.  ?2. Permanent atrial fibrillation: I do not think that he could be successfully cardioverted, has been in AF > 5 yrs.  ?- Continue apixaban.  ?3. CAD: PCI to RCA remotely.  1/23 cath with patent RCA stent, 70% dLCx.  ?- Continue atorvastatin 80 daily, lipids today.  ?- No ASA given apixaban use.  ?4. Pedal  ulcers: ?Venous stasis.  Difficult to palpate pulses.  ?- I will arrange for peripheral arterial dopplers.  ? ?Follow up  3 weeks with pharmacy for med titration and 6 weeks with APP ? ?Vincent [name] [employer]

## 2021-11-11 NOTE — Patient Instructions (Addendum)
Medication Changes: ? ?Increase Carvedilol to 6.25 mg Twice daily ? ?Restart your Delene Loll [phone] ? ?Lab Work: ? ?Labs done today, your results will be available in MyChart, we will contact you for abnormal readings. ? ? ?Testing/Procedures: ? ?none ? ?Referrals: ? ?Please follow up with our heart failure pharmacist in 3 weeks ? ? ?Special Instructions // Education: ? ?TAKE ALL MEDICATIONS AS PRESCRIBED BY YOUR PROVIDER, CALL OFFICE IF REFILLS NEEDED FOR YOUR CARDIAC MEDICATIONS ONLY. ? ?Follow-Up in: 6 WEEKS  ? ?At the [facility], you and your health needs are our priority. We have a designated team specialized in the treatment of Heart Failure. This Care Team includes your primary Heart Failure Specialized Cardiologist (physician), Advanced Practice Providers (APPs- Physician Assistants and Nurse Practitioners), and Pharmacist who all work together to provide you with the care you need, when you need it.  ? ?You may see any of the following providers on your designated Care Team at your next follow up: ? ?Dr Glori Bickers ?Dr Loralie Champagne ?Darrick Grinder, NP ?Lyda Jester, PA ?Jessica Milford,NP ?Marlyce Huge, PA ?Audry Riles, PharmD ? ? ?Please be sure to bring in all your medications bottles to every appointment.  ? ?Need to Contact us: ? ?If you have any questions or concerns before your next appointment please send [place] a message through [facility] or call our office at [phone].   ? ?TO LEAVE A MESSAGE FOR THE NURSE SELECT OPTION 2, PLEASE LEAVE A MESSAGE INCLUDING: ?YOUR NAME ?DATE OF BIRTH ?CALL BACK NUMBER ?REASON FOR CALL**this is important as we prioritize the call backs ? ?YOU WILL RECEIVE A CALL BACK THE SAME DAY AS LONG AS YOU CALL BEFORE 4:00 PM ? ? ? ? ? ? ?

## 2021-11-12 LAB — LIPID PANEL
Cholesterol: 137 mg/dL (ref 0–200)
HDL: 57 mg/dL (ref >40)
LDL Cholesterol: 62 mg/dL (ref 0–99)
Total CHOL/HDL Ratio: 2.4 RATIO
Triglycerides: 90 mg/dL (ref <150)
VLDL: 18 mg/dL (ref 0–40)

## 2021-11-12 NOTE — Progress Notes (Signed)
Sheriff, Aaronmichael M. ([id number]) ?Visit Report for 11/10/2021 ?Arrival Information Details ?Patient Name: Date of Service: ?[name], Shaune Pascal ID M. 11/10/2021 2:15 PM ?Medical Record Number: [id number] ?Patient Account Number: [id number] ?Date of Birth/Sex: Treating RN: ?[birth year] (86 y.o. M) Rolin Barry, Bobbi ?Primary Care Rami Budhu: [name] Other Clinician: ?Referring Karandeep Resende: ?Treating Gean Laursen/Extender: Fredirick Maudlin ?[name] ?Weeks in Treatment: 16 ?Visit Information History Since Last Visit ?Added or deleted any medications: No ?Patient Arrived: Ambulatory ?Any new allergies or adverse reactions: No ?Arrival Time: 14:25 ?Had a fall or experienced change in No ?Accompanied By: self ?activities of daily living that may affect ?Transfer Assistance: None ?risk of falls: ?Patient Identification Verified: Yes ?Signs or symptoms of abuse/neglect since last visito No ?Secondary Verification Process Completed: Yes ?Hospitalized since last visit: No ?Patient Requires Transmission-Based Precautions: No ?Implantable device outside of the clinic excluding No ?Patient Has Alerts: Yes ?cellular tissue based products placed in the center ?since last visit: ?Has Dressing in Place as Prescribed: Yes ?Has Compression in Place as Prescribed: Yes ?Pain Present Now: No ?Notes ?Patient brought in the correct new compression stockings Marlane Hirschmann mention for patient to purchase 8-56mHg. ?Electronic Signature(s) ?Signed: 11/11/2021 6:07:05 PM By: DDeon PillingRN, BSN ?Entered By: DDeon Pillingon 11/10/2021 14:26:11 ?-------------------------------------------------------------------------------- ?Clinic Level of Care Assessment Details ?Patient Name: Date of Service: ?[name], DShaune PascalID M. 11/10/2021 2:15 PM ?Medical Record Number: [id number]?Patient Account Number: [id number]?Date of Birth/Sex: Treating RN: ?[id number](86y.o. MBurnadette Pop Lauren ?Primary Care Donnamae Muilenburg: HWenda LowOther Clinician: ?Referring Giulio Bertino: ?Treating  Chantalle Defilippo/Extender: CFredirick Maudlin?HWenda Low?Weeks in Treatment: 16 ?Clinic Level of Care Assessment Items ?TOOL 4 Quantity Score ?X- 1 0 ?Use when only an EandM is performed on FOLLOW-UP visit ?ASSESSMENTS - Nursing Assessment / Reassessment ?X- 1 10 ?Reassessment of Co-morbidities (includes updates in patient status) ?X- 1 5 ?Reassessment of Adherence to Treatment Plan ?ASSESSMENTS - Wound and Skin A ssessment / Reassessment ?X - Simple Wound Assessment / Reassessment - one wound 1 5 ?'[]'$  - 0 ?Complex Wound Assessment / Reassessment - multiple wounds ?'[]'$  - 0 ?Dermatologic / Skin Assessment (not related to wound area) ?ASSESSMENTS - Focused Assessment ?'[]'$  - 0 ?Circumferential Edema Measurements - multi extremities ?'[]'$  - 0 ?Nutritional Assessment / Counseling / Intervention ?'[]'$  - 0 ?Lower Extremity Assessment (monofilament, tuning fork, pulses) ?'[]'$  - 0 ?Peripheral Arterial Disease Assessment (using hand held doppler) ?ASSESSMENTS - Ostomy and/or Continence Assessment and Care ?'[]'$  - 0 ?Incontinence Assessment and Management ?'[]'$  - 0 ?Ostomy Care Assessment and Management (repouching, etc.) ?PROCESS - Coordination of Care ?X - Simple Patient / Family Education for ongoing care 1 15 ?'[]'$  - 0 ?Complex (extensive) Patient / Family Education for ongoing care ?X- 1 10 ?Staff obtains Consents, Records, T Results / Process Orders ?est ?'[]'$  - 0 ?Staff telephones HHA, Nursing Homes / Clarify orders / etc ?'[]'$  - 0 ?Routine Transfer to another Facility (non-emergent condition) ?'[]'$  - 0 ?Routine Hospital Admission (non-emergent condition) ?'[]'$  - 0 ?New Admissions / IBiomedical engineer/ Ordering NPWT Apligraf, etc. ?, ?'[]'$  - 0 ?Emergency Hospital Admission (emergent condition) ?X- 1 10 ?Simple Discharge Coordination ?'[]'$  - 0 ?Complex (extensive) Discharge Coordination ?PROCESS - Special Needs ?'[]'$  - 0 ?Pediatric / Minor Patient Management ?'[]'$  - 0 ?Isolation Patient Management ?'[]'$  - 0 ?Hearing / Language / Visual special  needs ?'[]'$  - 0 ?Assessment of Community assistance (transportation, D/C planning, etc.) ?'[]'$  - 0 ?Additional assistance / Altered mentation ?'[]'$  - 0 ?Support Surface(s) Assessment (bed, cushion,  seat, etc.) ?INTERVENTIONS - Wound Cleansing / Measurement ?X - Simple Wound Cleansing - one wound 1 5 ?'[]'$  - 0 ?Complex Wound Cleansing - multiple wounds ?X- 1 5 ?Wound Imaging (photographs - any number of wounds) ?'[]'$  - 0 ?Wound Tracing (instead of photographs) ?X- 1 5 ?Simple Wound Measurement - one wound ?'[]'$  - 0 ?Complex Wound Measurement - multiple wounds ?INTERVENTIONS - Wound Dressings ?X - Small Wound Dressing one or multiple wounds 1 10 ?'[]'$  - 0 ?Medium Wound Dressing one or multiple wounds ?'[]'$  - 0 ?Large Wound Dressing one or multiple wounds ?X- 1 5 ?Application of Medications - topical ?'[]'$  - 0 ?Application of Medications - injection ?INTERVENTIONS - Miscellaneous ?'[]'$  - 0 ?External ear exam ?'[]'$  - 0 ?Specimen Collection (cultures, biopsies, blood, body fluids, etc.) ?'[]'$  - 0 ?Specimen(s) / Culture(s) sent or taken to Lab for analysis ?'[]'$  - 0 ?Patient Transfer (multiple staff / Civil Service fast streamer / Similar devices) ?'[]'$  - 0 ?Simple Staple / Suture removal (25 or less) ?'[]'$  - 0 ?Complex Staple / Suture removal (26 or more) ?'[]'$  - 0 ?Hypo / Hyperglycemic Management (close monitor of Blood Glucose) ?'[]'$  - 0 ?Ankle / Brachial Index (ABI) - do not check if billed separately ?X- 1 5 ?Vital Signs ?Has the patient been seen at the hospital within the last three years: Yes ?Total Score: 90 ?Level Of Care: New/Established - Level 3 ?Electronic Signature(s) ?Signed: 11/12/2021 12:57:05 PM By: Rhae Hammock RN ?Entered By: Rhae Hammock on 11/10/2021 16:20:59 ?-------------------------------------------------------------------------------- ?Encounter Discharge Information Details ?Patient Name: Date of Service: ?[name], Shaune Pascal ID M. 11/10/2021 2:15 PM ?Medical Record Number: [id number] ?Patient Account Number: [id number] ?Date of Birth/Sex:  Treating RN: ?1935-11-30 (86 y.o. Burnadette Pop, Lauren ?Primary Care Ollin Hochmuth: [name] Other Clinician: ?Referring Aleksandra Raben: ?Treating Carsyn Boster/Extender: Fredirick Maudlin ?[name] ?Weeks in Treatment: 16 ?Encounter Discharge Information Items ?Discharge Condition: Stable ?Ambulatory Status: Ambulatory ?Discharge Destination: Home ?Transportation: Private Auto ?Accompanied By: self ?Schedule Follow-up Appointment: Yes ?Clinical Summary of Care: Patient Declined ?Electronic Signature(s) ?Signed: 11/12/2021 12:57:05 PM By: Rhae Hammock RN ?Entered By: Rhae Hammock on 11/10/2021 16:23:27 ?-------------------------------------------------------------------------------- ?Lower Extremity Assessment Details ?Patient Name: Date of Service: ?[name], Shaune Pascal ID M. 11/10/2021 2:15 PM ?Medical Record Number: [id number] ?Patient Account Number: [id number] ?Date of Birth/Sex: Treating RN: ?[birth year] (86 y.o. M) Rolin Barry, Bobbi ?Primary Care Kyleigha Markert: [name] Other Clinician: ?Referring Derico Mitton: ?Treating Niemah Schwebke/Extender: Fredirick Maudlin ?[name] ?Weeks in Treatment: 16 ?Edema Assessment ?Assessed: [Left: No] [Right: Yes] ?Edema: [Left: No] [Right: No] ?Calf ?Left: Right: ?Point of Measurement: 31 cm From Medial Instep 34.5 cm ?Ankle ?Left: Right: ?Point of Measurement: 11 cm From Medial Instep 22 cm ?Vascular Assessment ?Pulses: ?Dorsalis Pedis ?Palpable: [Right:Yes] ?Electronic Signature(s) ?Signed: 11/11/2021 6:07:05 PM By: Deon Pilling RN, BSN ?Entered By: Deon Pilling on 11/10/2021 14:26:55 ?-------------------------------------------------------------------------------- ?Multi Wound Chart Details ?Patient Name: ?Date of Service: ?[name], [name] 11/10/2021 2:15 PM ?Medical Record Number: [id number] ?Patient Account Number: [id number] ?Date of Birth/Sex: ?Treating RN: ?04-14-1936 (86 y.o. M) ?Primary Care Rhyder Bratz: [name] ?Other Clinician: ?Referring Rosalia Mcavoy: ?Treating Kyla Duffy/Extender:  Fredirick Maudlin ?[name] ?Weeks in Treatment: 16 ?Vital Signs ?Height(in): 69 ?Capillary Blood Glucose(mg/dl): 110 ?Weight(lbs): 185 ?Pulse(bpm): 87 ?Body Mass Index(BMI): 27.3 ?Blood Pressure(mmHg): 162/7

## 2021-11-16 DIAGNOSIS — E1165 Type 2 diabetes mellitus with hyperglycemia: Secondary | ICD-10-CM | POA: Diagnosis not present

## 2021-11-16 DIAGNOSIS — I10 Essential (primary) hypertension: Secondary | ICD-10-CM | POA: Diagnosis not present

## 2021-11-16 DIAGNOSIS — E785 Hyperlipidemia, unspecified: Secondary | ICD-10-CM | POA: Diagnosis not present

## 2021-11-16 DIAGNOSIS — E1142 Type 2 diabetes mellitus with diabetic polyneuropathy: Secondary | ICD-10-CM | POA: Diagnosis not present

## 2021-11-18 ENCOUNTER — Telehealth (HOSPITAL_COMMUNITY): Payer: Self-pay

## 2021-11-18 DIAGNOSIS — L97909 Non-pressure chronic ulcer of unspecified part of unspecified lower leg with unspecified severity: Secondary | ICD-10-CM

## 2021-11-18 NOTE — Telephone Encounter (Signed)
Dopplers ordered ?

## 2021-11-23 DIAGNOSIS — H35033 Hypertensive retinopathy, bilateral: Secondary | ICD-10-CM | POA: Diagnosis not present

## 2021-11-23 DIAGNOSIS — E119 Type 2 diabetes mellitus without complications: Secondary | ICD-10-CM | POA: Diagnosis not present

## 2021-11-23 DIAGNOSIS — H401133 Primary open-angle glaucoma, bilateral, severe stage: Secondary | ICD-10-CM | POA: Diagnosis not present

## 2021-11-23 DIAGNOSIS — H25812 Combined forms of age-related cataract, left eye: Secondary | ICD-10-CM | POA: Diagnosis not present

## 2021-12-02 ENCOUNTER — Other Ambulatory Visit (HOSPITAL_COMMUNITY): Payer: Self-pay | Admitting: Cardiology

## 2021-12-02 DIAGNOSIS — L97909 Non-pressure chronic ulcer of unspecified part of unspecified lower leg with unspecified severity: Secondary | ICD-10-CM

## 2021-12-06 NOTE — Progress Notes (Incomplete)
***  In Progress*** ? ?  ?Advanced Heart Failure Clinic Note  ?Primary Care: Dr [name]  ?HF Cardiology: Dr. Aundra Dubin ? ?HPI:  ?[name] is 86 y.o. with history of HFrEF, permanent AF, CAD, T2DM, CVA, left foot droop, and chronic wounds referred from [facility] clinic for CHF evaluation.  He is a retired Chief Executive Officer.  ?  ?Admitted in 08/2021 with increased shortness of breath in the setting of acute heart failure. On arrival EKG showed A fib/RVR with rate in the 100s. Diuresed with IV lasix. Echo completed with EF 25%. Had cath with borderline obstructive disease in the distal LCx, patent stent in the mid RCA. No change compared to 2016 cath. GDMT started.  ?  ?He has had chronic lower extremity wounds and has been followed at the wound clinic, now healed.  ?  ?Presented to AHF clinic on 11/11/21. Reported doing relatively well.\ He remained in atrial fibrillation.  No dyspnea walking on flat ground.  He did get mildly short of breath walking up a hill.  No orthopnea/PND.  No chest pain.  No BRBPR/melena. He wass out of Entresto (just ran out). Chronic left-sided weakness from CVA but does not walk with a cane and is not particularly unstable.  ? ?Today he returns to HF clinic for pharmacist medication titration. At last visit with MD ***.  ? ?Shortness of breath/dyspnea on exertion? {YES NO:22349}  ?Orthopnea/PND? {YES NO:22349} ?Edema? {YES NO:22349} ?Lightheadedness/dizziness? {YES NO:22349} ?Daily weights at home? {YES NO:22349} ?Blood pressure/heart rate monitoring at home? {YES NO:22349} ?Following low-sodium/fluid-restricted diet? {YES NO:22349} ? ?HF Medications: ?Carvedilol 6.25 mg BID  ?Entresto 49/51 BID ?Spironolactone 25 mg daily ?Jardiance 25 mg daily ?Furosemide 40 mg daily ?Digoxin 0.125 mg daily ? ?Has the patient been experiencing any side effects to the medications prescribed?  {YES NO:22349} ? ?Does the patient have any problems obtaining medications due to transportation or finances?   {YES  NO:22349} ? ?Understanding of regimen: {excellent/good/fair/poor:19665} ?Understanding of indications: {excellent/good/fair/poor:19665} ?Potential of compliance: {excellent/good/fair/poor:19665} ?Patient understands to avoid NSAIDs. ?Patient understands to avoid decongestants. ?  ? ?Pertinent Lab Values: ?11/11/21: Serum creatinine 0.92, BUN 13, Potassium 4.1, Sodium 141, ?Vital Signs: ?Weight: *** (last clinic weight: 178 lbs) ?Blood pressure: ***  ?Heart rate: ***  ? ?Assessment/Plan: ?1. Chronic systolic CHF: Probably predominantly NICM. LHC in 08/2021 with patent RCA stent and 70% distal LCx stenosis.  Echo in 08/2021 with EF 25%, mild-moderate MR and moderate TR.  Cardiomyopathy could be due to long-standing (permanent) atrial fibrillation.  No strong FH of CMP.  He denies heavy ETOH use. NYHA class II symptoms, he is not volume overloaded on exam.  ?- Continue digoxin 0.125 mg daily ?- Increase Coreg to 6.25 mg bid.   ?- Restart Entresto 49/51 bid.  ?- Continue Jardiance 25 mg daily.  ?- Continue spironolactone 25 mg daily.  BMET today.  ?- Continue Lasix 40 mg daily.  ?- Narrow QRS, not CRT candidate.  ?2. Permanent atrial fibrillation: I do not think that he could be successfully cardioverted, has been in AF > 5 yrs.  ?- Continue apixaban.  ?3. CAD: PCI to RCA remotely.  08/2021 cath with patent RCA stent, 70% dLCx.  ?- Continue atorvastatin 80 daily, lipids today.  ?- No ASA given apixaban use.  ?4. Pedal ulcers: ?Venous stasis.  Difficult to palpate pulses.  ?- I will arrange for peripheral arterial dopplers.  ? ?Follow up *** ? ? ?Audry Riles, PharmD, BCPS, BCCP, CPP ?Heart Failure Clinic Pharmacist ?[phone] ?  ?

## 2021-12-07 ENCOUNTER — Ambulatory Visit (HOSPITAL_COMMUNITY)
Admission: RE | Admit: 2021-12-07 | Discharge: 2021-12-07 | Disposition: A | Discharge status: Home / Self Care | Payer: Medicare Other | Source: Ambulatory Visit | Attending: Internal Medicine | Admitting: Internal Medicine

## 2021-12-07 VITALS — BP 130/82 | HR 56 | Wt 181.0 lb

## 2021-12-07 DIAGNOSIS — I2583 Coronary atherosclerosis due to lipid rich plaque: Secondary | ICD-10-CM

## 2021-12-07 DIAGNOSIS — Z955 Presence of coronary angioplasty implant and graft: Secondary | ICD-10-CM | POA: Diagnosis not present

## 2021-12-07 DIAGNOSIS — Z8673 Personal history of transient ischemic attack (TIA), and cerebral infarction without residual deficits: Secondary | ICD-10-CM | POA: Diagnosis not present

## 2021-12-07 DIAGNOSIS — R9439 Abnormal result of other cardiovascular function study: Secondary | ICD-10-CM

## 2021-12-07 DIAGNOSIS — Z01812 Encounter for preprocedural laboratory examination: Secondary | ICD-10-CM | POA: Diagnosis not present

## 2021-12-07 DIAGNOSIS — I10 Essential (primary) hypertension: Secondary | ICD-10-CM

## 2021-12-07 DIAGNOSIS — I5023 Acute on chronic systolic (congestive) heart failure: Secondary | ICD-10-CM

## 2021-12-07 DIAGNOSIS — I69354 Hemiplegia and hemiparesis following cerebral infarction affecting left non-dominant side: Secondary | ICD-10-CM | POA: Diagnosis not present

## 2021-12-07 DIAGNOSIS — Z79899 Other long term (current) drug therapy: Secondary | ICD-10-CM | POA: Diagnosis not present

## 2021-12-07 DIAGNOSIS — Z7984 Long term (current) use of oral hypoglycemic drugs: Secondary | ICD-10-CM | POA: Insufficient documentation

## 2021-12-07 DIAGNOSIS — Z7901 Long term (current) use of anticoagulants: Secondary | ICD-10-CM | POA: Diagnosis not present

## 2021-12-07 DIAGNOSIS — I4821 Permanent atrial fibrillation: Secondary | ICD-10-CM | POA: Diagnosis not present

## 2021-12-07 DIAGNOSIS — I5022 Chronic systolic (congestive) heart failure: Secondary | ICD-10-CM | POA: Diagnosis not present

## 2021-12-07 DIAGNOSIS — E119 Type 2 diabetes mellitus without complications: Secondary | ICD-10-CM | POA: Insufficient documentation

## 2021-12-07 DIAGNOSIS — I2510 Atherosclerotic heart disease of native coronary artery without angina pectoris: Secondary | ICD-10-CM

## 2021-12-07 DIAGNOSIS — E7849 Other hyperlipidemia: Secondary | ICD-10-CM

## 2021-12-07 MED ORDER — FUROSEMIDE 20 MG PO TABS: 20.0000 mg | ORAL_TABLET | Freq: Two times a day (BID) | ORAL | 5 refills | Status: DC

## 2021-12-07 MED ORDER — SACUBITRIL-VALSARTAN 97-103 MG PO TABS: 1.0000 | ORAL_TABLET | Freq: Two times a day (BID) | ORAL | 11 refills | Status: AC

## 2021-12-07 MED ORDER — CARVEDILOL 6.25 MG PO TABS: 6.2500 mg | ORAL_TABLET | Freq: Two times a day (BID) | ORAL | 11 refills | Status: AC

## 2021-12-07 NOTE — Progress Notes (Signed)
?  ?Advanced Heart Failure Clinic Note  ?Primary Care: Dr [name]  ?HF Cardiology: Dr. Aundra Dubin ? ?HPI:  ?Vincent [name] is 86 y.o. with history of HFrEF, permanent AF, CAD, T2DM, CVA, left foot droop, and chronic wounds referred from [facility] clinic for CHF evaluation.  He is a retired Chief Executive Officer.  ?  ?Admitted in 08/2021 with increased shortness of breath in the setting of acute heart failure. On arrival EKG showed A fib/RVR with rate in the 100s. Diuresed with IV Lasix. Echo completed with EF 25%. Had cath with borderline obstructive disease in the distal LCx, patent stent in the mid RCA. No change compared to 2016 cath. GDMT started.  ?  ?He has had chronic lower extremity wounds and has been followed at the wound clinic, now healed.  ?  ?Presented to AHF clinic on 11/11/21. Reported doing relatively well. He remained in atrial fibrillation.  No dyspnea walking on flat ground.  He reported getting mildly short of breath walking up a hill.  No orthopnea/PND.  No chest pain.  No BRBPR/melena. He was out of Delene Loll (just ran out). Chronic left-sided weakness from CVA but does not walk with a cane and is not particularly unstable.  ? ?Today he returns to HF clinic for pharmacist medication titration. At last visit with MD carvedilol was increased to 6.25 mg BID. Overall feeling pretty good today. Denies dizziness/lightheadedness. Endorses intermittent fatigue. Denies CP, palpitations. Says his breathing is pretty good. Gets SOB sometimes walking up the hill to his house from his mailbox. Denies limitations in ADLs. Weight at home stable around 180 lbs. Endorses taking furosemide 20 mg BID (prescribed as 40 mg daily in chart) and has not needed any extra. Denies LEE/PND/orthopnea. States his appetite is good, endorses following a low salt diet.  ? ?HF Medications: ?Carvedilol 6.25 mg BID  ?Entresto 49/51 mg BID ?Spironolactone 25 mg daily ?Jardiance 25 mg daily ?Isosorbide mononitrate 30 mg daily ?Furosemide 40 mg daily - takes 20 mg  BID ?Digoxin 0.125 mg daily ? ?Has the patient been experiencing any side effects to the medications prescribed?  No ? ?Does the patient have any problems obtaining medications due to transportation or finances?   No, has BCBS Medicare ? ?Understanding of regimen: poor ?Understanding of indications: fair ?Potential of compliance: fair ?Patient understands to avoid NSAIDs. ?Patient understands to avoid decongestants. ?  ?Pertinent Lab Values: ?11/11/21: Serum creatinine 0.92, BUN 13, Potassium 4.1, Sodium 141 ? ?Vital Signs: ?Weight: 181 lbs (last clinic weight: 178 lbs) ?Blood pressure: 130/82  ?Heart rate: 56  ? ?Assessment/Plan: ?1. Chronic systolic CHF: Probably predominantly NICM. LHC in 08/2021 with patent RCA stent and 70% distal LCx stenosis.  Echo in 08/2021 with EF 25%, mild-moderate Vincent and moderate TR.  Cardiomyopathy could be due to long-standing (permanent) atrial fibrillation.  No strong FH of CMP.  He denies heavy ETOH use.  ?- NYHA class II symptoms, he is not volume overloaded on exam.  ?- Continue Lasix 20 mg BID ?- Continue carvedilol 6.25 mg BID. HR 56. ?- Increase Entresto to 97/103 mg BID. Repeat BMET in 2 weeks.  ?- Continue spironolactone 25 mg daily. ?- Continue Jardiance 25 mg daily.  ?- Continue isosorbide mononitrate 30 mg daily.  ?- Continue digoxin 0.125 mg daily.  ?- Narrow QRS, not CRT candidate.  ? ?2. Permanent atrial fibrillation: Unlikely to be successfully cardioverted, has been in AF > 5 yrs.  ?- Continue apixaban.  ? ?3. CAD: PCI to RCA remotely.  08/2021  cath with patent RCA stent, 70% dLCx.  ?- Continue atorvastatin 80 mg daily ?- No ASA given apixaban use.  ? ?4. Pedal ulcers: ?Venous stasis.  Difficult to palpate pulses.  ?- Dopples ordered by Dr. Aundra Dubin, scheduled for 12/14/21 ? ?Follow up 12/23/21 with APP clinic.  ? ? ?Vincent [name], PharmD, BCPS, BCCP, CPP ?Heart Failure Clinic Pharmacist ?[phone] ?  ? ? ? ? ? ?

## 2021-12-07 NOTE — Patient Instructions (Signed)
It was a pleasure seeing you today! ? ?MEDICATIONS: ?-We are changing your medications today ?-Increase Entresto to 97/103 mg (1 tablet) twice daily. You may take 2 tablets of the 49/51 mg strength twice daily until you pick up the new strength.  ?-Call if you have questions about your medications. ? ? ?NEXT APPOINTMENT: ?Return to clinic in 2 weeks with [facility]. ? ?In general, to take care of your heart failure: ?-Limit your fluid intake to 2 Liters (half-gallon) per day.   ?-Limit your salt intake to ideally 2-3 grams (2000-3000 mg) per day. ?-Weigh yourself daily and record, and bring that "weight diary" to your next appointment.  (Weight gain of 2-3 pounds in 1 day typically means fluid weight.) ?-The medications for your heart are to help your heart and help you live longer.   ?-Please contact us before stopping any of your heart medications. ? ?Call the clinic at [phone] with questions or to reschedule future appointments.  ?

## 2021-12-08 DIAGNOSIS — E1142 Type 2 diabetes mellitus with diabetic polyneuropathy: Secondary | ICD-10-CM | POA: Diagnosis not present

## 2021-12-08 DIAGNOSIS — I2510 Atherosclerotic heart disease of native coronary artery without angina pectoris: Secondary | ICD-10-CM | POA: Diagnosis not present

## 2021-12-08 DIAGNOSIS — I10 Essential (primary) hypertension: Secondary | ICD-10-CM | POA: Diagnosis not present

## 2021-12-08 DIAGNOSIS — D6869 Other thrombophilia: Secondary | ICD-10-CM | POA: Diagnosis not present

## 2021-12-14 ENCOUNTER — Ambulatory Visit (HOSPITAL_COMMUNITY)
Admission: RE | Admit: 2021-12-14 | Payer: Medicare Other | Source: Ambulatory Visit | Attending: Cardiology | Admitting: Cardiology

## 2021-12-15 ENCOUNTER — Encounter (HOSPITAL_COMMUNITY): Payer: Self-pay

## 2021-12-16 ENCOUNTER — Other Ambulatory Visit: Payer: Self-pay

## 2021-12-16 NOTE — Telephone Encounter (Signed)
This is a CHF pt 

## 2021-12-17 MED ORDER — ISOSORBIDE MONONITRATE ER 30 MG PO TB24: 30.0000 mg | ORAL_TABLET | Freq: Every day | ORAL | 3 refills | Status: AC

## 2021-12-22 NOTE — Progress Notes (Signed)
? ? ? ?Primary Care: Dr [name]  ?HF Cardiology: Dr. Aundra Dubin ? ?[name] is 86 y.o. with history of HFrEF, permanent AF, CAD, T2DM, CVA, left foot droop, and chronic wounds referred from [facility] clinic for CHF evaluation.  He is a retired Chief Executive Officer.  ? ?Admitted in 1/23 with increased shortness of breath in the setting of acute heart failure. On arrival EKG showed A fib/RVR with rate in the 100s. Diuresed with IV lasix. Echo completed with EF 25%. Had cath with borderline obstructive disease in the distal LCx, patent stent in the mid RCA. No change compared to 2016 cath. GDMT started.  ? ?He has had chronic lower extremity wounds and has been followed at the wound clinic, now healed.  ? ?Established with Dr. Aundra Dubin 03/23. Coreg increased and restarted entresto. Entresto further increased at visit with PharmD on 12/07/21.  ? ?Here today for f/u. No complaints of chest pain. Does get short of breath with exertion, but no recent progression in symptoms. No orthopnea or PND. Has some chronic lower extremity edema. Unable to get compression stockings on. Tolerating medications okay. No side effects reported. ? ? ?Labs (1/23): LDL 77 ?Labs (2/23): K 4.1, creatinine 1.13 ? ?PMH: ?1. CVA: Related to atrial fibrillation.  ?2. Atrial fibrillation: Permanent.  ?3. Chronic lower extremity wounds =>? Venous stasis, resolved.  ?4. Chronic systolic CHF: Suspect primarily nonischemic cardiomyopathy.  ?- Echo (2015): EF 40-45%.  ?- Echo (1/23): EF 35%, normal RV, mild-moderate MR, moderate TR.  ?- LHC/RHC (1/23): 70% distal LCx stenosis, patent RCA stent, 50% mRCA; mean PCWP 22, CI 2.67.  ?5. CAD: PCI to RCA remotely. ?- LHC in 1/23 with 70% distal LCx stenosis, patent RCA stent, 50% mRCA ? ?Review of Systems: All systems reviewed and negative except as per HPI.  ? ?Current Outpatient Medications  ?Medication Sig Dispense Refill  ? ACCU-CHEK FASTCLIX LANCETS MISC USE TO CHECK BLOOD SUGAR QD  6  ? ACCU-CHEK GUIDE test strip USE TO CHECK  BLOOD SUGAR QD  6  ? acetaminophen (TYLENOL) 500 MG tablet Take 2 tablets (1,000 mg total) by mouth every 6 (six) hours as needed. 30 tablet 0  ? apixaban (ELIQUIS) 5 MG TABS tablet Take 1 tablet (5 mg total) by mouth 2 (two) times daily. 60 tablet 5  ? atorvastatin (LIPITOR) 80 MG tablet Take 1 tablet (80 mg total) by mouth daily. 30 tablet 5  ? brimonidine (ALPHAGAN) 0.2 % ophthalmic solution 1 drop into affected eye    ? calcium-vitamin D (OSCAL WITH D) 250-125 MG-UNIT tablet Take 1 tablet by mouth daily.    ? carvedilol (COREG) 6.25 MG tablet Take 1 tablet (6.25 mg total) by mouth 2 (two) times daily with a meal. 60 tablet 11  ? Continuous Blood Gluc Sensor (FREESTYLE LIBRE 14 DAY SENSOR) MISC USE AS DIRECTED EVERY 14 DAYS.    ? Cyanocobalamin (VITAMIN B-12 CR PO) Take 1 tablet by mouth daily.    ? digoxin (LANOXIN) 0.125 MG tablet Take 1 tablet (0.125 mg total) by mouth daily. 30 tablet 5  ? famotidine (PEPCID) 20 MG tablet Take 20 mg by mouth 2 (two) times daily.    ? Ferrous Sulfate (IRON PO) Take 1 tablet by mouth daily.    ? furosemide (LASIX) 20 MG tablet Take 1 tablet (20 mg total) by mouth 2 (two) times daily. 60 tablet 5  ? isosorbide mononitrate (IMDUR) 30 MG 24 hr tablet Take 1 tablet (30 mg total) by mouth daily. 90 tablet  3  ? JARDIANCE 25 MG TABS tablet Take 25 mg by mouth daily.    ? loratadine (CLARITIN) 10 MG tablet Take 10 mg by mouth daily.    ? Magnesium 250 MG TABS Take 1 tablet by mouth 2 (two) times daily.    ? meclizine (ANTIVERT) 25 MG tablet Take 25 mg by mouth 3 (three) times daily as needed for dizziness.    ? metFORMIN (GLUCOPHAGE) 500 MG tablet Take 500 mg by mouth 2 (two) times daily.    ? nitroGLYCERIN (NITROLINGUAL) 0.4 MG/SPRAY spray Place 1 spray under the tongue every 5 (five) minutes x 3 doses as needed for chest pain. Please call and schedule an appointment 12 g 0  ? Omega-3 Fatty Acids (OMEGA-3 FISH OIL PO) Take 1 capsule by mouth daily.    ? ROCKLATAN 0.02-0.005 % SOLN  Apply 1 drop to eye at bedtime.    ? sacubitril-valsartan (ENTRESTO) 97-103 MG Take 1 tablet by mouth 2 (two) times daily. 60 tablet 11  ? spironolactone (ALDACTONE) 25 MG tablet TAKE 1 TABLET(25 MG) BY MOUTH DAILY 90 tablet 3  ? ?No current facility-administered medications for this encounter.  ? ? ?No Known Allergies ? ?  ?Social History  ? ?Socioeconomic History  ? Marital status: Divorced  ?  Spouse name: Not on file  ? Number of children: 0  ? Years of education: master's  ? Highest education level: Master's degree (e.g., MA, MS, MEng, MEd, MSW, MBA)  ?Occupational History  ? Occupation: semi-retired  ?Tobacco Use  ? Smoking status: Former  ?  Packs/day: 0.50  ?  Years: 15.00  ?  Pack years: 7.50  ?  Types: Cigarettes  ?  Quit date: 08/22/1983  ?  Years since quitting: 38.3  ? Smokeless tobacco: Never  ?Vaping Use  ? Vaping Use: Never used  ?Substance and Sexual Activity  ? Alcohol use: No  ? Drug use: No  ? Sexual activity: Not on file  ?Other Topics Concern  ? Not on file  ?Social History Narrative  ? Patient drinks 1-2 cups of caffeine daily.  ? Patient is right handed.  ? ?Social Determinants of Health  ? ?Financial Resource Strain: Low Risk   ? Difficulty of Paying Living Expenses: Not very hard  ?Food Insecurity: No Food Insecurity  ? Worried About Charity fundraiser in the Last Year: Never true  ? Ran Out of Food in the Last Year: Never true  ?Transportation Needs: No Transportation Needs  ? Lack of Transportation (Medical): No  ? Lack of Transportation (Non-Medical): No  ?Physical Activity: Not on file  ?Stress: Not on file  ?Social Connections: Not on file  ?Intimate Partner Violence: Not on file  ? ? ?  ?Family History  ?Problem Relation Age of Onset  ? Colon cancer Mother   ? Hypertension Mother   ? Cancer Mother   ? Prostate cancer Brother   ? Cancer Brother   ? Heart disease Father   ? Heart attack Father   ? Hypertension Father   ? Heart attack Sister   ? Cancer Sister   ? Stroke Neg Hx   ? Colon  polyps Neg Hx   ? Esophageal cancer Neg Hx   ? Stomach cancer Neg Hx   ? Rectal cancer Neg Hx   ? Pancreatic cancer Neg Hx   ? ? ?Vitals:  ? 12/23/21 1122  ?BP: 112/66  ?Pulse: 67  ?SpO2: 97%  ?Weight: 80.2 kg (176 lb 12.8  oz)  ?Height: '5\' 9"'$  (1.753 m)  ? ? ?Wt Readings from Last 3 Encounters:  ?12/23/21 80.2 kg (176 lb 12.8 oz)  ?12/07/21 82.1 kg (181 lb)  ?11/11/21 80.9 kg (178 lb 6.4 oz)  ? ? ?PHYSICAL EXAM: ?General:  Ambulated into clinic. No distress. ?HEENT: normal ?Neck: supple. no JVD. Carotids 2+ bilat; no bruits.  ?Cor: PMI nondisplaced. Irregular rate & rhythm. No rubs, gallops or murmurs. ?Lungs: clear ?Abdomen: soft, nontender, nondistended. No hepatosplenomegaly.  ?Extremities: no cyanosis, clubbing, rash, 1-2+ edema, venous stasis changes noted, small scab right anterior shin ?Neuro: alert & orientedx3, cranial nerves grossly intact. Left sided weakness from prior CVA. ? ? ?ASSESSMENT & PLAN: ?1. Chronic systolic CHF: Probably predominantly NICM. LHC in 1/23 with patent RCA stent and 70% distal LCx stenosis.  Echo in 1/23 with EF 25%, mild-moderate MR and moderate TR.  Cardiomyopathy could be due to long-standing (permanent) atrial fibrillation.  No strong FH of CMP.  He denies heavy ETOH use. NYHA class II. Volume looks okay on exam. ?- Continue digoxin ?- Continue coreg 6.125 mg BID ?- Continue Entresto 97/103 mg BID ?- Continue Jardiance 25 mg daily.  ?- Continue spironolactone 25 mg daily.   ?- Continue Lasix 40 mg daily.  ?- Narrow QRS, not CRT candidate.  ?- BMET today ?- Repeat echo to reassess LV function and EF ?2. Permanent atrial fibrillation: I do not think that he could be successfully cardioverted, has been in AF > 5 yrs.  ?- Continue apixaban 5 mg BID (appropriate dose based on body weight and renal function) ?3. CAD: PCI to RCA remotely.  1/23 cath with patent RCA stent, 70% dLCx.  ?- Continue atorvastatin 80 daily, LDL 62 in 03/23 ?- No ASA given apixaban use.  ?4. Lower extremity  ulcers:  ?- Wounds healed. 1 scabbed area right anterior shin ?- ?Venous stasis.  Prescribed compression stockings (low grade compression in case has PVD) and tool to assist with placement ?- Difficult to pa

## 2021-12-23 ENCOUNTER — Ambulatory Visit (HOSPITAL_COMMUNITY)
Admission: RE | Admit: 2021-12-23 | Discharge: 2021-12-23 | Disposition: A | Discharge status: Home / Self Care | Payer: Medicare Other | Source: Ambulatory Visit | Attending: Physician Assistant | Admitting: Physician Assistant

## 2021-12-23 ENCOUNTER — Encounter (HOSPITAL_COMMUNITY): Payer: Self-pay

## 2021-12-23 VITALS — BP 112/66 | HR 67 | Ht 69.0 in | Wt 176.8 lb

## 2021-12-23 DIAGNOSIS — I2510 Atherosclerotic heart disease of native coronary artery without angina pectoris: Secondary | ICD-10-CM | POA: Insufficient documentation

## 2021-12-23 DIAGNOSIS — I5022 Chronic systolic (congestive) heart failure: Secondary | ICD-10-CM | POA: Insufficient documentation

## 2021-12-23 DIAGNOSIS — E11621 Type 2 diabetes mellitus with foot ulcer: Secondary | ICD-10-CM | POA: Insufficient documentation

## 2021-12-23 DIAGNOSIS — I4821 Permanent atrial fibrillation: Secondary | ICD-10-CM | POA: Insufficient documentation

## 2021-12-23 DIAGNOSIS — S81809A Unspecified open wound, unspecified lower leg, initial encounter: Secondary | ICD-10-CM | POA: Diagnosis not present

## 2021-12-23 DIAGNOSIS — E119 Type 2 diabetes mellitus without complications: Secondary | ICD-10-CM | POA: Diagnosis not present

## 2021-12-23 DIAGNOSIS — Z7901 Long term (current) use of anticoagulants: Secondary | ICD-10-CM | POA: Diagnosis not present

## 2021-12-23 DIAGNOSIS — Z955 Presence of coronary angioplasty implant and graft: Secondary | ICD-10-CM | POA: Insufficient documentation

## 2021-12-23 DIAGNOSIS — I69354 Hemiplegia and hemiparesis following cerebral infarction affecting left non-dominant side: Secondary | ICD-10-CM | POA: Insufficient documentation

## 2021-12-23 DIAGNOSIS — Z79899 Other long term (current) drug therapy: Secondary | ICD-10-CM | POA: Diagnosis not present

## 2021-12-23 DIAGNOSIS — Z7984 Long term (current) use of oral hypoglycemic drugs: Secondary | ICD-10-CM | POA: Insufficient documentation

## 2021-12-23 LAB — BASIC METABOLIC PANEL
Anion gap: 5 (ref 5–15)
BUN: 14 mg/dL (ref 8–23)
CO2: 29 mmol/L (ref 22–32)
Calcium: 8.9 mg/dL (ref 8.9–10.3)
Chloride: 106 mmol/L (ref 98–111)
Creatinine, Ser: 1.05 mg/dL (ref 0.61–1.24)
GFR, Estimated: >60 mL/min (ref >60)
Glucose, Bld: 109 mg/dL — ABNORMAL HIGH (ref 70–99)
Potassium: 4.4 mmol/L (ref 3.5–5.1)
Sodium: 140 mmol/L (ref 135–145)

## 2021-12-23 NOTE — Patient Instructions (Addendum)
Thank you for coming in today ? ?Labs were done today, if any labs are abnormal the clinic will call you ?No news is good news ? ?You have been given a prescription for compression hose  ? ?Your physician recommends that you schedule a follow-up appointment in:  ?3 months with Dr. Aundra Dubin with echocardiogram you will be called to have this appointment scheduled  ? ?Your physician has requested that you have an echocardiogram. Echocardiography is a painless test that uses sound waves to create images of your heart. It provides your doctor with information about the size and shape of your heart and how well your heart?s chambers and valves are working. This procedure takes approximately one hour. There are no restrictions for this procedure. ?  ?At the [facility], you and your health needs are our priority. As part of our continuing mission to provide you with exceptional heart care, we have created designated Provider Care Teams. These Care Teams include your primary Cardiologist (physician) and Advanced Practice Providers (APPs- Physician Assistants and Nurse Practitioners) who all work together to provide you with the care you need, when you need it.  ? ?You may see any of the following providers on your designated Care Team at your next follow up: ?Dr Glori Bickers ?Dr Loralie Champagne ?Darrick Grinder, NP ?Lyda Jester, PA ?Jessica Milford,NP ?Marlyce Huge, PA ?Audry Riles, PharmD ? ? ?Please be sure to bring in all your medications bottles to every appointment.  ? ?If you have any questions or concerns before your next appointment please send [place] a message through [place] or call our office at [phone].   ? ?TO LEAVE A MESSAGE FOR THE NURSE SELECT OPTION 2, PLEASE LEAVE A MESSAGE INCLUDING: ?YOUR NAME ?DATE OF BIRTH ?CALL BACK NUMBER ?REASON FOR CALL**this is important as we prioritize the call backs ? ?YOU WILL RECEIVE A CALL BACK THE SAME DAY AS LONG AS YOU CALL BEFORE 4:00 PM  ?

## 2021-12-24 ENCOUNTER — Ambulatory Visit (HOSPITAL_COMMUNITY)
Admission: RE | Admit: 2021-12-24 | Discharge: 2021-12-24 | Disposition: A | Discharge status: Home / Self Care | Payer: Medicare Other | Source: Ambulatory Visit | Attending: Cardiology | Admitting: Cardiology

## 2021-12-24 DIAGNOSIS — L97918 Non-pressure chronic ulcer of unspecified part of right lower leg with other specified severity: Secondary | ICD-10-CM

## 2021-12-24 DIAGNOSIS — L97909 Non-pressure chronic ulcer of unspecified part of unspecified lower leg with unspecified severity: Secondary | ICD-10-CM | POA: Insufficient documentation

## 2021-12-24 DIAGNOSIS — L97928 Non-pressure chronic ulcer of unspecified part of left lower leg with other specified severity: Secondary | ICD-10-CM

## 2022-01-10 ENCOUNTER — Ambulatory Visit (INDEPENDENT_AMBULATORY_CARE_PROVIDER_SITE_OTHER): Payer: Medicare Other | Admitting: Podiatry

## 2022-01-10 ENCOUNTER — Encounter: Payer: Self-pay | Admitting: Podiatry

## 2022-01-10 DIAGNOSIS — B351 Tinea unguium: Secondary | ICD-10-CM

## 2022-01-10 DIAGNOSIS — D689 Coagulation defect, unspecified: Secondary | ICD-10-CM

## 2022-01-10 DIAGNOSIS — E1142 Type 2 diabetes mellitus with diabetic polyneuropathy: Secondary | ICD-10-CM | POA: Diagnosis not present

## 2022-01-10 DIAGNOSIS — M79675 Pain in left toe(s): Secondary | ICD-10-CM | POA: Diagnosis not present

## 2022-01-10 DIAGNOSIS — M79674 Pain in right toe(s): Secondary | ICD-10-CM

## 2022-01-10 NOTE — Progress Notes (Signed)
This patient returns to my office for at risk foot care.  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  This patient is unable to cut nails himself since the patient cannot reach his nails.These nails are painful walking and wearing shoes.  This patient presents for at risk foot care today.  General Appearance  Alert, conversant and in no acute stress.  Vascular  Dorsalis pedis and posterior tibial  pulses are weakly/absent palpable  bilaterally.  Capillary return is within normal limits  bilaterally. Temperature is within normal limits  bilaterally.  Neurologic  Senn-Weinstein monofilament wire test diminished   bilaterally. Muscle power within normal limits bilaterally.  Nails Thick disfigured discolored nails with subungual debris  from hallux to fifth toes bilaterally. No evidence of bacterial infection or drainage bilaterally.  Orthopedic  No limitations of motion  feet .  No crepitus or effusions noted.  No bony pathology or digital deformities noted.  Skin  normotropic skin with no porokeratosis noted bilaterally.  No signs of infections or ulcers noted.     Onychomycosis  Pain in right toe  Pain in left toe.  Consent was obtained for treatment procedures.  Debridement and grinding of long thick nails with clearing of subungual debris.  No infection or ulcer.     Return office visit   3 months       Told patient to return for periodic foot care and evaluation due to potential at risk complications.   Cathryn Gallery DPM  

## 2022-01-26 DIAGNOSIS — E785 Hyperlipidemia, unspecified: Secondary | ICD-10-CM | POA: Diagnosis not present

## 2022-01-26 DIAGNOSIS — I2510 Atherosclerotic heart disease of native coronary artery without angina pectoris: Secondary | ICD-10-CM | POA: Diagnosis not present

## 2022-01-26 DIAGNOSIS — I10 Essential (primary) hypertension: Secondary | ICD-10-CM | POA: Diagnosis not present

## 2022-01-26 DIAGNOSIS — E1165 Type 2 diabetes mellitus with hyperglycemia: Secondary | ICD-10-CM | POA: Diagnosis not present

## 2022-02-25 DIAGNOSIS — I482 Chronic atrial fibrillation, unspecified: Secondary | ICD-10-CM | POA: Diagnosis not present

## 2022-02-25 DIAGNOSIS — E1140 Type 2 diabetes mellitus with diabetic neuropathy, unspecified: Secondary | ICD-10-CM | POA: Diagnosis not present

## 2022-02-25 DIAGNOSIS — I509 Heart failure, unspecified: Secondary | ICD-10-CM | POA: Diagnosis not present

## 2022-02-25 DIAGNOSIS — R5383 Other fatigue: Secondary | ICD-10-CM | POA: Diagnosis not present

## 2022-03-18 ENCOUNTER — Other Ambulatory Visit: Payer: Self-pay | Admitting: *Deleted

## 2022-03-18 ENCOUNTER — Other Ambulatory Visit (HOSPITAL_COMMUNITY): Payer: Self-pay

## 2022-03-18 DIAGNOSIS — Z5181 Encounter for therapeutic drug level monitoring: Secondary | ICD-10-CM

## 2022-03-18 DIAGNOSIS — I10 Essential (primary) hypertension: Secondary | ICD-10-CM

## 2022-03-18 DIAGNOSIS — I482 Chronic atrial fibrillation, unspecified: Secondary | ICD-10-CM

## 2022-03-18 DIAGNOSIS — E785 Hyperlipidemia, unspecified: Secondary | ICD-10-CM

## 2022-03-18 MED ORDER — SPIRONOLACTONE 25 MG PO TABS
25.0000 mg | ORAL_TABLET | Freq: Every day | ORAL | 3 refills | Status: DC
  Filled 2022-03-18: qty 90, 90d supply, fill #0

## 2022-03-18 MED ORDER — SPIRONOLACTONE 25 MG PO TABS: 25.0000 mg | ORAL_TABLET | Freq: Every day | ORAL | 3 refills | Status: DC

## 2022-03-28 DIAGNOSIS — E1165 Type 2 diabetes mellitus with hyperglycemia: Secondary | ICD-10-CM | POA: Diagnosis not present

## 2022-03-28 DIAGNOSIS — I2510 Atherosclerotic heart disease of native coronary artery without angina pectoris: Secondary | ICD-10-CM | POA: Diagnosis not present

## 2022-03-28 DIAGNOSIS — I10 Essential (primary) hypertension: Secondary | ICD-10-CM | POA: Diagnosis not present

## 2022-03-28 DIAGNOSIS — E785 Hyperlipidemia, unspecified: Secondary | ICD-10-CM | POA: Diagnosis not present

## 2022-03-29 ENCOUNTER — Ambulatory Visit (HOSPITAL_COMMUNITY)
Admission: RE | Admit: 2022-03-29 | Discharge: 2022-03-29 | Disposition: A | Discharge status: Home / Self Care | Payer: Medicare Other | Source: Ambulatory Visit | Attending: Physician Assistant | Admitting: Physician Assistant

## 2022-03-29 ENCOUNTER — Encounter (HOSPITAL_COMMUNITY): Payer: Self-pay | Admitting: Cardiology

## 2022-03-29 ENCOUNTER — Ambulatory Visit (HOSPITAL_BASED_OUTPATIENT_CLINIC_OR_DEPARTMENT_OTHER)
Admission: RE | Admit: 2022-03-29 | Discharge: 2022-03-29 | Disposition: A | Discharge status: Home / Self Care | Payer: Medicare Other | Source: Ambulatory Visit | Attending: Cardiology | Admitting: Cardiology

## 2022-03-29 VITALS — BP 110/70 | HR 56 | Wt 176.8 lb

## 2022-03-29 DIAGNOSIS — Z7901 Long term (current) use of anticoagulants: Secondary | ICD-10-CM | POA: Insufficient documentation

## 2022-03-29 DIAGNOSIS — I83029 Varicose veins of left lower extremity with ulcer of unspecified site: Secondary | ICD-10-CM

## 2022-03-29 DIAGNOSIS — I2510 Atherosclerotic heart disease of native coronary artery without angina pectoris: Secondary | ICD-10-CM | POA: Insufficient documentation

## 2022-03-29 DIAGNOSIS — I83019 Varicose veins of right lower extremity with ulcer of unspecified site: Secondary | ICD-10-CM

## 2022-03-29 DIAGNOSIS — Z8673 Personal history of transient ischemic attack (TIA), and cerebral infarction without residual deficits: Secondary | ICD-10-CM | POA: Insufficient documentation

## 2022-03-29 DIAGNOSIS — R079 Chest pain, unspecified: Secondary | ICD-10-CM | POA: Diagnosis not present

## 2022-03-29 DIAGNOSIS — Z955 Presence of coronary angioplasty implant and graft: Secondary | ICD-10-CM | POA: Diagnosis not present

## 2022-03-29 DIAGNOSIS — Z7984 Long term (current) use of oral hypoglycemic drugs: Secondary | ICD-10-CM | POA: Insufficient documentation

## 2022-03-29 DIAGNOSIS — L97919 Non-pressure chronic ulcer of unspecified part of right lower leg with unspecified severity: Secondary | ICD-10-CM

## 2022-03-29 DIAGNOSIS — I5022 Chronic systolic (congestive) heart failure: Secondary | ICD-10-CM

## 2022-03-29 DIAGNOSIS — I4821 Permanent atrial fibrillation: Secondary | ICD-10-CM | POA: Diagnosis not present

## 2022-03-29 DIAGNOSIS — Z79899 Other long term (current) drug therapy: Secondary | ICD-10-CM | POA: Diagnosis not present

## 2022-03-29 DIAGNOSIS — E11621 Type 2 diabetes mellitus with foot ulcer: Secondary | ICD-10-CM | POA: Insufficient documentation

## 2022-03-29 DIAGNOSIS — L97929 Non-pressure chronic ulcer of unspecified part of left lower leg with unspecified severity: Secondary | ICD-10-CM

## 2022-03-29 LAB — DIGOXIN LEVEL: Digoxin Level: 0.8 ng/mL (ref 0.8–2.0)

## 2022-03-29 LAB — CBC
HCT: 42.5 % (ref 39.0–52.0)
Hemoglobin: 12.9 g/dL — ABNORMAL LOW (ref 13.0–17.0)
MCH: 26 pg (ref 26.0–34.0)
MCHC: 30.4 g/dL (ref 30.0–36.0)
MCV: 85.5 fL (ref 80.0–100.0)
Platelets: 180 10*3/uL (ref 150–400)
RBC: 4.97 MIL/uL (ref 4.22–5.81)
RDW: 16.8 % — ABNORMAL HIGH (ref 11.5–15.5)
WBC: 3.4 10*3/uL — ABNORMAL LOW (ref 4.0–10.5)
nRBC: 0 % (ref 0.0–0.2)

## 2022-03-29 LAB — BASIC METABOLIC PANEL
Anion gap: 7 (ref 5–15)
BUN: 14 mg/dL (ref 8–23)
CO2: 28 mmol/L (ref 22–32)
Calcium: 8.9 mg/dL (ref 8.9–10.3)
Chloride: 104 mmol/L (ref 98–111)
Creatinine, Ser: 0.97 mg/dL (ref 0.61–1.24)
GFR, Estimated: >60 mL/min (ref >60)
Glucose, Bld: 107 mg/dL — ABNORMAL HIGH (ref 70–99)
Potassium: 4 mmol/L (ref 3.5–5.1)
Sodium: 139 mmol/L (ref 135–145)

## 2022-03-29 LAB — ECHOCARDIOGRAM COMPLETE
Area-P 1/2: 4.68 cm2
Calc EF: 37.7 %
MV M vel: 5.22 m/s
MV Peak grad: 109 mmHg
Radius: 0.67 cm
S' Lateral: 6 cm
Single Plane A2C EF: 36.4 %
Single Plane A4C EF: 39.3 %

## 2022-03-29 LAB — BRAIN NATRIURETIC PEPTIDE: B Natriuretic Peptide: 848.4 pg/mL — ABNORMAL HIGH (ref 0.0–100.0)

## 2022-03-29 NOTE — Progress Notes (Signed)
Primary Care: Dr [name]  HF Cardiology: Dr. Aundra Dubin  Vincent [name] is 86 y.o. with history of HFrEF, permanent AF, CAD, T2DM, CVA, left foot droop, and chronic wounds referred from [facility] clinic for CHF evaluation.  He is a retired Chief Executive Officer.   Admitted in 1/23 with increased shortness of breath in the setting of acute heart failure. On arrival EKG showed A fib/RVR with rate in the 100s. Diuresed with IV lasix. Echo completed with EF 25%. Had cath with borderline obstructive disease in the distal LCx, patent stent in the mid RCA. No change compared to 2016 cath. GDMT started.   He has had chronic lower extremity wounds and has been followed at the wound clinic.   Established with Dr. Aundra Dubin 03/23. Coreg increased and restarted entresto. Entresto further increased at visit with PharmD on 12/07/21.   He is here today for f/u and repeat echo. EF unchanged 25-30%, global HK, RV mildly reduced, mild to moderate Vincent, IVC not dilated.   He reports stable NYHA Class II symptoms. Has occasional CP but very infrequent, responds to SL NTG.  Denies resting CP. Reports full med compliance. BP 110/70. Denies orthostatic symptoms. Continues w/ b/l LE wounds. Says he stopped going to wound clinic, trying to manage his wounds himself. Denies fever/chills.    Labs (1/23): LDL 77 Labs (2/23): K 4.1, creatinine 1.13 Labs (4/23): K 4.4, creatinine 1.05   PMH: 1. CVA: Related to atrial fibrillation.  2. Atrial fibrillation: Permanent.  3. Chronic lower extremity wounds =>? Venous stasis, resolved.  4. Chronic systolic CHF: Suspect primarily nonischemic cardiomyopathy.  - Echo (2015): EF 40-45%.  - Echo (1/23): EF 35%, normal RV, mild-moderate Vincent, moderate TR.  - LHC/RHC (1/23): 70% distal LCx stenosis, patent RCA stent, 50% mRCA; mean PCWP 22, CI 2.67.  5. CAD: PCI to RCA remotely. - LHC in 1/23 with 70% distal LCx stenosis, patent RCA stent, 50% mRCA  Review of Systems: All systems reviewed and negative except  as per HPI.   Current Outpatient Medications  Medication Sig Dispense Refill   ACCU-CHEK FASTCLIX LANCETS MISC USE TO CHECK BLOOD SUGAR QD  6   ACCU-CHEK GUIDE test strip USE TO CHECK BLOOD SUGAR QD  6   acetaminophen (TYLENOL) 500 MG tablet Take 2 tablets (1,000 mg total) by mouth every 6 (six) hours as needed. 30 tablet 0   apixaban (ELIQUIS) 5 MG TABS tablet Take 1 tablet (5 mg total) by mouth 2 (two) times daily. 60 tablet 5   atorvastatin (LIPITOR) 80 MG tablet Take 1 tablet (80 mg total) by mouth daily. 30 tablet 5   Brimonidine Tartrate (ALPHAGAN P OP) Apply 1 drop to eye in the morning, at noon, and at bedtime. Both eyes     Calcium Carbonate-Vitamin D (OSCAL 500 D-3 PO) Take 1 tablet by mouth 2 (two) times daily.     carvedilol (COREG) 6.25 MG tablet Take 1 tablet (6.25 mg total) by mouth 2 (two) times daily with a meal. 60 tablet 11   Continuous Blood Gluc Sensor (FREESTYLE LIBRE 14 DAY SENSOR) MISC USE AS DIRECTED EVERY 14 DAYS.     Cyanocobalamin (VITAMIN B-12 CR PO) Take 1 tablet by mouth daily.     digoxin (LANOXIN) 0.125 MG tablet Take 1 tablet (0.125 mg total) by mouth daily. 30 tablet 5   famotidine (PEPCID) 20 MG tablet Take 20 mg by mouth 2 (two) times daily.     Ferrous Sulfate (IRON PO) Take 1 tablet  by mouth daily.     furosemide (LASIX) 20 MG tablet Take 1 tablet (20 mg total) by mouth 2 (two) times daily. 60 tablet 5   isosorbide mononitrate (IMDUR) 30 MG 24 hr tablet Take 1 tablet (30 mg total) by mouth daily. 90 tablet 3   JARDIANCE 25 MG TABS tablet Take 25 mg by mouth daily.     loratadine (CLARITIN) 10 MG tablet Take 10 mg by mouth daily.     Magnesium 250 MG TABS Take 1 tablet by mouth 2 (two) times daily.     meclizine (ANTIVERT) 25 MG tablet Take 25 mg by mouth 3 (three) times daily as needed for dizziness.     metFORMIN (GLUCOPHAGE) 500 MG tablet Take 500 mg by mouth 2 (two) times daily.     nitroGLYCERIN (NITROLINGUAL) 0.4 MG/SPRAY spray Place 1 spray under  the tongue every 5 (five) minutes x 3 doses as needed for chest pain. Please call and schedule an appointment 12 g 0   Omega-3 Fatty Acids (OMEGA-3 FISH OIL PO) Take 1 capsule by mouth daily.     ROCKLATAN 0.02-0.005 % SOLN Apply 1 drop to eye at bedtime.     sacubitril-valsartan (ENTRESTO) 97-103 MG Take 1 tablet by mouth 2 (two) times daily. 60 tablet 11   spironolactone (ALDACTONE) 25 MG tablet Take 1 tablet (25 mg total) by mouth daily. 90 tablet 3   No current facility-administered medications for this encounter.    No Known Allergies    Social History   Socioeconomic History   Marital status: Divorced    Spouse name: Not on file   Number of children: 0   Years of education: master's   Highest education level: Master's degree (e.g., MA, MS, MEng, MEd, MSW, MBA)  Occupational History   Occupation: semi-retired  Tobacco Use   Smoking status: Former    Packs/day: 0.50    Years: 15.00    Total pack years: 7.50    Types: Cigarettes    Quit date: 08/22/1983    Years since quitting: 38.6   Smokeless tobacco: Never  Vaping Use   Vaping Use: Never used  Substance and Sexual Activity   Alcohol use: No   Drug use: No   Sexual activity: Not on file  Other Topics Concern   Not on file  Social History Narrative   Patient drinks 1-2 cups of caffeine daily.   Patient is right handed.   Social Determinants of Health   Financial Resource Strain: Low Risk  (09/21/2021)   Overall Financial Resource Strain (CARDIA)    Difficulty of Paying Living Expenses: Not very hard  Food Insecurity: No Food Insecurity (09/21/2021)   Hunger Vital Sign    Worried About Running Out of Food in the Last Year: Never true    Ran Out of Food in the Last Year: Never true  Transportation Needs: No Transportation Needs (09/21/2021)   PRAPARE - Hydrologist (Medical): No    Lack of Transportation (Non-Medical): No  Physical Activity: Not on file  Stress: Not on file  Social  Connections: Not on file  Intimate Partner Violence: Not on file      Family History  Problem Relation Age of Onset   Colon cancer Mother    Hypertension Mother    Cancer Mother    Prostate cancer Brother    Cancer Brother    Heart disease Father    Heart attack Father    Hypertension Father  Heart attack Sister    Cancer Sister    Stroke Neg Hx    Colon polyps Neg Hx    Esophageal cancer Neg Hx    Stomach cancer Neg Hx    Rectal cancer Neg Hx    Pancreatic cancer Neg Hx     Vitals:   03/29/22 1517  BP: 110/70  Pulse: (!) 56  SpO2: 98%  Weight: 80.2 kg (176 lb 12.8 oz)    Wt Readings from Last 3 Encounters:  03/29/22 80.2 kg (176 lb 12.8 oz)  12/23/21 80.2 kg (176 lb 12.8 oz)  12/07/21 82.1 kg (181 lb)    PHYSICAL EXAM: General:  Well appearing, elderly male. No respiratory difficulty HEENT: normal Neck: supple. no JVD. Carotids 2+ bilat; no bruits. No lymphadenopathy or thyromegaly appreciated. Cor: PMI nondisplaced. Irregularly irregular & rhythm and slow rate. No rubs, gallops or murmurs. Lungs: clear Abdomen: soft, nontender, nondistended. No hepatosplenomegaly. No bruits or masses. Good bowel sounds. Extremities: no cyanosis, clubbing, rash. Trace b/l LEE w/ chronic venous stasis dermatis and b/l LE wounds  Neuro: alert & oriented x 3, cranial nerves grossly intact. moves all 4 extremities w/o difficulty. Affect pleasant.   ASSESSMENT & PLAN: 1. Chronic systolic CHF: Probably predominantly NICM. LHC in 1/23 with patent RCA stent and 70% distal LCx stenosis.  Echo in 1/23 with EF 25%, mild-moderate Vincent and moderate TR.  Cardiomyopathy could be due to long-standing (permanent) atrial fibrillation.  No strong FH of CMP.  He denies heavy ETOH use. Echo repeated today, EF unchanged at 25-30%, RV mildly reduced. NYHA Class II stable - Continue digoxin 0.125. Check Dig level  - Continue coreg 6.125 mg BID. Bradycardia limits further titration  - Continue Entresto  97/103 mg BID - Continue Jardiance 25 mg daily.  - Continue spironolactone 25 mg daily.   - Continue Lasix 40 mg daily. Check BMP and BNP today. May need slight dose increase if BNP elevated  - EF remains < 35%, despite 3 months of maximum tolerated GDMT. Doubt he would be suitable ICD candidate given age and chronic LE wounds and risk for infection. We discussed this and he is also agrees that he would not want device  2. Permanent atrial fibrillation: I do not think that he could be successfully cardioverted based on chronicity, has been in AF > 5 yrs.  - continue rate control strategy w/ ? blocker, Coreg 6.125 mg bid  - Continue apixaban 5 mg BID (appropriate dose based on body weight and renal function). Check BMP today  - denies abnormal bleeding. Check CBC  3. CAD: PCI to RCA remotely.  1/23 cath with patent RCA stent, 70% dLCx.  - rare chest pain that is nitrate response. < 1 episode a month  - given episodes not frequent, will refrain from Imdur titration given soft BP, advanced age and risk for falls while on chronic anticoagulation for afib. Continue PRN SL NTG - continue Imdur 30 mg daily + Coreg 6.125 mg bid   - Continue atorvastatin 80 daily, LDL 62 in 03/23 - No ASA given apixaban use.  4. Lower extremity ulcers: Suspect 2/2 Venous stasis. LE arterial dopplers 4/23 w/ normal ABIs bilaterally. Previously followed by wound clinic but has been trying to manage himself at home. Needs further wound care.  - refer back to wound clinic  5. DM II: - Continue jardiance   F/u w/ APP in 2 months   Lyda Jester , PA-C  03/29/2022

## 2022-03-29 NOTE — Patient Instructions (Signed)
EKG done today.  Labs done today. We will contact you only if your labs are abnormal.  No medication changes were made. Please continue all current medications as prescribed.  You have been referred to Wound care center. They will contact you to schedule an appointment.   Your physician recommends that you schedule a follow-up appointment in: 2 months with our NP/PA Clinic here in our office.  If you have any questions or concerns before your next appointment please send [place] a message through [place] or call our office at [phone].    TO LEAVE A MESSAGE FOR THE NURSE SELECT OPTION 2, PLEASE LEAVE A MESSAGE INCLUDING: YOUR NAME DATE OF BIRTH CALL BACK NUMBER REASON FOR CALL**this is important as we prioritize the call backs  YOU WILL RECEIVE A CALL BACK THE SAME DAY AS LONG AS YOU CALL BEFORE 4:00 PM   Do the following things EVERYDAY: Weigh yourself in the morning before breakfast. Write it down and keep it in a log. Take your medicines as prescribed Eat low salt foods--Limit salt (sodium) to 2000 mg per day.  Stay as active as you can everyday Limit all fluids for the day to less than 2 liters   At the [facility], you and your health needs are our priority. As part of our continuing mission to provide you with exceptional heart care, we have created designated Provider Care Teams. These Care Teams include your primary Cardiologist (physician) and Advanced Practice Providers (APPs- Physician Assistants and Nurse Practitioners) who all work together to provide you with the care you need, when you need it.   You may see any of the following providers on your designated Care Team at your next follow up: Dr Glori Bickers Dr Haynes Kerns, NP Lyda Jester, [place] Audry Riles, PharmD   Please be sure to bring in all your medications bottles to every appointment.

## 2022-04-12 ENCOUNTER — Ambulatory Visit (INDEPENDENT_AMBULATORY_CARE_PROVIDER_SITE_OTHER): Payer: Medicare Other | Admitting: Podiatry

## 2022-04-12 ENCOUNTER — Encounter: Payer: Self-pay | Admitting: Podiatry

## 2022-04-12 DIAGNOSIS — E1142 Type 2 diabetes mellitus with diabetic polyneuropathy: Secondary | ICD-10-CM | POA: Diagnosis not present

## 2022-04-12 DIAGNOSIS — D689 Coagulation defect, unspecified: Secondary | ICD-10-CM | POA: Diagnosis not present

## 2022-04-12 DIAGNOSIS — M79675 Pain in left toe(s): Secondary | ICD-10-CM

## 2022-04-12 DIAGNOSIS — B351 Tinea unguium: Secondary | ICD-10-CM

## 2022-04-12 DIAGNOSIS — M79674 Pain in right toe(s): Secondary | ICD-10-CM

## 2022-04-12 NOTE — Progress Notes (Signed)
This patient returns to my office for at risk foot care.  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  This patient is unable to cut nails himself since the patient cannot reach his nails.These nails are painful walking and wearing shoes.  This patient presents for at risk foot care today.  General Appearance  Alert, conversant and in no acute stress.  Vascular  Dorsalis pedis and posterior tibial  pulses are weakly/absent palpable  bilaterally.  Capillary return is within normal limits  bilaterally. Temperature is within normal limits  bilaterally.  Neurologic  Senn-Weinstein monofilament wire test diminished   bilaterally. Muscle power within normal limits bilaterally.  Nails Thick disfigured discolored nails with subungual debris  from hallux to fifth toes bilaterally. No evidence of bacterial infection or drainage bilaterally.  Orthopedic  No limitations of motion  feet .  No crepitus or effusions noted.  No bony pathology or digital deformities noted.  Skin  normotropic skin with no porokeratosis noted bilaterally.  No signs of infections or ulcers noted.     Onychomycosis  Pain in right toe  Pain in left toe.  Consent was obtained for treatment procedures.  Debridement and grinding of long thick nails with clearing of subungual debris.  No infection or ulcer.     Return office visit   3 months       Told patient to return for periodic foot care and evaluation due to potential at risk complications.   Gardiner Barefoot DPM

## 2022-04-15 ENCOUNTER — Encounter (HOSPITAL_BASED_OUTPATIENT_CLINIC_OR_DEPARTMENT_OTHER): Payer: Medicare Other | Attending: General Surgery | Admitting: General Surgery

## 2022-04-15 DIAGNOSIS — Z8673 Personal history of transient ischemic attack (TIA), and cerebral infarction without residual deficits: Secondary | ICD-10-CM | POA: Insufficient documentation

## 2022-04-15 DIAGNOSIS — E1140 Type 2 diabetes mellitus with diabetic neuropathy, unspecified: Secondary | ICD-10-CM | POA: Insufficient documentation

## 2022-04-15 DIAGNOSIS — I2510 Atherosclerotic heart disease of native coronary artery without angina pectoris: Secondary | ICD-10-CM | POA: Diagnosis not present

## 2022-04-15 DIAGNOSIS — E11622 Type 2 diabetes mellitus with other skin ulcer: Secondary | ICD-10-CM | POA: Diagnosis not present

## 2022-04-15 DIAGNOSIS — I4819 Other persistent atrial fibrillation: Secondary | ICD-10-CM | POA: Insufficient documentation

## 2022-04-15 DIAGNOSIS — I872 Venous insufficiency (chronic) (peripheral): Secondary | ICD-10-CM | POA: Diagnosis not present

## 2022-04-15 DIAGNOSIS — I509 Heart failure, unspecified: Secondary | ICD-10-CM | POA: Diagnosis not present

## 2022-04-15 DIAGNOSIS — Z950 Presence of cardiac pacemaker: Secondary | ICD-10-CM | POA: Insufficient documentation

## 2022-04-15 DIAGNOSIS — L97811 Non-pressure chronic ulcer of other part of right lower leg limited to breakdown of skin: Secondary | ICD-10-CM | POA: Diagnosis not present

## 2022-04-15 DIAGNOSIS — I110 Hypertensive heart disease with heart failure: Secondary | ICD-10-CM | POA: Insufficient documentation

## 2022-04-15 DIAGNOSIS — L97821 Non-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 leg limited to breakdown of skin: Secondary | ICD-10-CM | POA: Insufficient documentation

## 2022-04-15 NOTE — Progress Notes (Signed)
Vicknair, Alonzo M. ([id number]) Visit Report for 04/15/2022 Allergy List Details Patient Name: Date of Service: [name] ID M. 04/15/2022 1:30 PM Medical Record Number: [id number] Patient Account Number: [id number] Date of Birth/Sex: Treating RN: 1935-11-28 (86 y.o. Janyth Contes Primary Care Lylian Sanagustin: [name] Other Clinician: Referring Jaxon Flatt: Treating Demetrice Amstutz/Extender: Cristie Hem, Dalton Weeks in Treatment: 0 Allergies Active Allergies No Known Allergies Allergy Notes Electronic Signature(s) Signed: 04/15/2022 4:00:02 PM By: Adline Peals Entered By: Adline Peals on 04/15/2022 13:43:10 -------------------------------------------------------------------------------- Arrival Information Details Patient Name: Date of Service: [name], [name] 04/15/2022 1:30 PM Medical Record Number: [id number] Patient Account Number: [id number] Date of Birth/Sex: Treating RN: 1935/10/28 (86 y.o. Janyth Contes Primary Care Maeva Dant: [name] Other Clinician: Referring Heith Haigler: Treating Brandolyn Shortridge/Extender: Garlan Fair in Treatment: 0 Visit Information Patient Arrived: Ambulatory Arrival Time: 13:39 Accompanied By: self Transfer Assistance: None Patient Identification Verified: Yes Secondary Verification Process Completed: Yes Patient Requires Transmission-Based Precautions: No Patient Has Alerts: Yes Patient Alerts: Patient on Blood Thinner History Since Last Visit Added or deleted any medications: No Any new allergies or adverse reactions: No Had a fall or experienced change in activities of daily living that may affect risk of falls: No Signs or symptoms of abuse/neglect since last visito No Hospitalized since last visit: No Implantable device outside of the clinic excluding cellular tissue based products placed in the center since last visit: No Pain Present Now: No Electronic Signature(s) Signed:  04/15/2022 4:00:02 PM By: Adline Peals Entered By: Adline Peals on 04/15/2022 13:40:24 -------------------------------------------------------------------------------- Clinic Level of Care Assessment Details Patient Name: Date of Service: [name], Shaune Pascal ID M. 04/15/2022 1:30 PM Medical Record Number: [id number] Patient Account Number: [id number] Date of Birth/Sex: Treating RN: 04-28-1936 (86 y.o. Janyth Contes Primary Care Brandalyn Harting: [name] Other Clinician: Referring Najah Liverman: Treating Shavar Gorka/Extender: Garlan Fair in Treatment: 0 Clinic Level of Care Assessment Items TOOL 1 Quantity Score X- 1 0 Use when EandM and Procedure is performed on INITIAL visit ASSESSMENTS - Nursing Assessment / Reassessment X- 1 20 General Physical Exam (combine w/ comprehensive assessment (listed just below) when performed on new pt. evals) X- 1 25 Comprehensive Assessment (HX, ROS, Risk Assessments, Wounds Hx, etc.) ASSESSMENTS - Wound and Skin Assessment / Reassessment '[]'$  - 0 Dermatologic / Skin Assessment (not related to wound area) ASSESSMENTS - Ostomy and/or Continence Assessment and Care '[]'$  - 0 Incontinence Assessment and Management '[]'$  - 0 Ostomy Care Assessment and Management (repouching, etc.) PROCESS - Coordination of Care X - Simple Patient / Family Education for ongoing care 1 15 '[]'$  - 0 Complex (extensive) Patient / Family Education for ongoing care X- 1 10 Staff obtains Programmer, systems, Records, T Results / Process Orders est '[]'$  - 0 Staff telephones HHA, Nursing Homes / Clarify orders / etc '[]'$  - 0 Routine Transfer to another Facility (non-emergent condition) '[]'$  - 0 Routine Hospital Admission (non-emergent condition) X- 1 15 New Admissions / Biomedical engineer / Ordering NPWT Apligraf, etc. , '[]'$  - 0 Emergency Hospital Admission (emergent condition) PROCESS - Special Needs '[]'$  - 0 Pediatric / Minor Patient Management '[]'$  -  0 Isolation Patient Management '[]'$  - 0 Hearing / Language / Visual special needs '[]'$  - 0 Assessment of Community assistance (transportation, D/C planning, etc.) '[]'$  - 0 Additional assistance / Altered mentation '[]'$  - 0 Support Surface(s) Assessment (bed, cushion, seat, etc.) INTERVENTIONS - Miscellaneous '[]'$  - 0 External ear exam '[]'$  - 0 Patient  Transfer (multiple staff / Civil Service fast streamer / Similar devices) '[]'$  - 0 Simple Staple / Suture removal (25 or less) '[]'$  - 0 Complex Staple / Suture removal (26 or more) '[]'$  - 0 Hypo/Hyperglycemic Management (do not check if billed separately) '[]'$  - 0 Ankle / Brachial Index (ABI) - do not check if billed separately Has the patient been seen at the hospital within the last three years: Yes Total Score: 85 Level Of Care: New/Established - Level 3 Electronic Signature(s) Signed: 04/15/2022 4:00:02 PM By: Adline Peals Entered By: Adline Peals on 04/15/2022 14:44:50 -------------------------------------------------------------------------------- Encounter Discharge Information Details Patient Name: Date of Service: [name], [name] 04/15/2022 1:30 PM Medical Record Number: [id number] Patient Account Number: [id number] Date of Birth/Sex: Treating RN: 1936-06-19 (86 y.o. Janyth Contes Primary Care Caidyn Blossom: [name] Other Clinician: Referring Yvon Mccord: Treating Talani Brazee/Extender: Garlan Fair in Treatment: 0 Encounter Discharge Information Items Post Procedure Vitals Discharge Condition: Stable Temperature (F): 97.6 Ambulatory Status: Ambulatory Pulse (bpm): 67 Discharge Destination: Home Respiratory Rate (breaths/min): 18 Transportation: Private Auto Blood Pressure (mmHg): 147/66 Accompanied By: self Schedule Follow-up Appointment: Yes Clinical Summary of Care: Patient Declined Electronic Signature(s) Signed: 04/15/2022 4:00:02 PM By: Adline Peals Entered By: Adline Peals on  04/15/2022 14:45:44 -------------------------------------------------------------------------------- Lower Extremity Assessment Details Patient Name: Date of Service: [name], Shaune Pascal ID M. 04/15/2022 1:30 PM Medical Record Number: [id number] Patient Account Number: [id number] Date of Birth/Sex: Treating RN: 27-Jan-1936 (86 y.o. Janyth Contes Primary Care Chrisanna Mishra: [name] Other Clinician: Referring Sumire Halbleib: Treating Rajanae Mantia/Extender: Cristie Hem, Crist Fat in Treatment: 0 Edema Assessment Assessed: Shirlyn Goltz: No] [Right: No] E[Left: dema] [Right: :] Calf Left: Right: Point of Measurement: From Medial Instep 37.1 cm 39.5 cm Ankle Left: Right: Point of Measurement: From Medial Instep 24 cm 24.1 cm Knee To Floor Left: Right: From Medial Instep 41 cm 42 cm Vascular Assessment Pulses: Dorsalis Pedis Palpable: [Left:No] [Right:Yes] Electronic Signature(s) Signed: 04/15/2022 4:00:02 PM By: Adline Peals Entered By: Adline Peals on 04/15/2022 13:49:27 -------------------------------------------------------------------------------- Multi Wound Chart Details Patient Name: Date of Service: [name], [name] 04/15/2022 1:30 PM Medical Record Number: [id number] Patient Account Number: [id number] Date of Birth/Sex: Treating RN: 03-23-1936 (86 y.o. Janyth Contes Primary Care Sarim Rothman: [name] Other Clinician: Referring Oris Calmes: Treating Manoah Deckard/Extender: Cristie Hem, Crist Fat in Treatment: 0 Vital Signs Height(in): 24 Capillary Blood Glucose(mg/dl): 90 Weight(lbs): 175 Pulse(bpm): 54 Body Mass Index(BMI): 25.8 Blood Pressure(mmHg): 147/66 Temperature(F): 97.6 Respiratory Rate(breaths/min): 18 Photos: Left, Anterior Lower Leg Right, Anterior Lower Leg Right, Medial Lower Leg Wound Location: Blister Blister Blister Wounding Event: Venous Leg Ulcer Venous Leg Ulcer Venous Leg Ulcer Primary  Etiology: Cataracts, Glaucoma, Chronic Cataracts, Glaucoma, Chronic Cataracts, Glaucoma, Chronic Comorbid History: Obstructive Pulmonary Disease Obstructive Pulmonary Disease Obstructive Pulmonary Disease (COPD), Arrhythmia, Congestive (COPD), Arrhythmia, Congestive (COPD), Arrhythmia, Congestive Heart Failure, Coronary Artery Heart Failure, Coronary Artery Heart Failure, Coronary Artery Disease, Hypertension, Myocardial Disease, Hypertension, Myocardial Disease, Hypertension, Myocardial Infarction, Type II Diabetes, Gout, Infarction, Type II Diabetes, Gout, Infarction, Type II Diabetes, Gout, Neuropathy Neuropathy Neuropathy 01/27/2022 01/27/2022 01/27/2022 Date Acquired: 0 0 0 Weeks of Treatment: Open Open Open Wound Status: No No No Wound Recurrence: 2x1.2x0.1 0.6x0.5x0.1 1.5x0.8x0.1 Measurements L x W x D (cm) 1.885 0.236 0.942 A (cm) : rea 0.188 0.024 0.094 Volume (cm) : 0.00% 0.00% 0.00% % Reduction in A rea: 0.00% 0.00% 0.00% % Reduction in Volume: Full Thickness Without Exposed Full Thickness Without Exposed Full Thickness Without Exposed Classification:  Support Structures Support Structures Support Structures Medium Medium Medium Exudate A mount: Serosanguineous Serosanguineous Serosanguineous Exudate Type: red, brown red, brown red, brown Exudate Color: Distinct, outline attached Distinct, outline attached Distinct, outline attached Wound Margin: Large (67-100%) Large (67-100%) Large (67-100%) Granulation A mount: Red Red Red Granulation Quality: Small (1-33%) Small (1-33%) Small (1-33%) Necrotic A mount: Adherent Slough Eschar Adherent Slough Necrotic Tissue: Fat Layer (Subcutaneous Tissue): Yes Fat Layer (Subcutaneous Tissue): Yes Fat Layer (Subcutaneous Tissue): Yes Exposed Structures: Fascia: No Fascia: No Fascia: No Tendon: No Tendon: No Tendon: No Muscle: No Muscle: No Muscle: No Joint: No Joint: No Joint: No Bone: No Bone: No Bone: No None  Small (1-33%) Small (1-33%) Epithelialization: Debridement - Selective/Open Wound Debridement - Selective/Open Wound Debridement - Selective/Open Wound Debridement: Pre-procedure Verification/Time Out 14:04 14:04 14:04 Taken: Lidocaine 4% Topical Solution Lidocaine 4% Topical Solution Lidocaine 4% Topical Solution Pain Control: Necrotic/Eschar, Psychologist, prison and probation services, Slough Necrotic/Eschar Tissue Debrided: Non-Viable Tissue Non-Viable Tissue Non-Viable Tissue Level: 10 0.3 5 Debridement A (sq cm): rea Curette Curette Curette Instrument: Minimum Minimum Minimum Bleeding: Pressure Pressure Pressure Hemostasis Achieved: 0 0 0 Procedural Pain: 0 0 0 Post Procedural Pain: Procedure was tolerated well Procedure was tolerated well Procedure was tolerated well Debridement Treatment Response: 2x1.2x0.1 0.6x0.5x0.1 1.5x0.8x0.1 Post Debridement Measurements L x W x D (cm) 0.188 0.024 0.094 Post Debridement Volume: (cm) Debridement Debridement Debridement Procedures Performed: Treatment Notes Electronic Signature(s) Signed: 04/15/2022 2:21:22 PM By: Fredirick Maudlin MD FACS Signed: 04/15/2022 4:00:02 PM By: Adline Peals Entered By: Fredirick Maudlin on 04/15/2022 14:21:22 -------------------------------------------------------------------------------- Multi-Disciplinary Care Plan Details Patient Name: Date of Service: [name], [name] 04/15/2022 1:30 PM Medical Record Number: [id number] Patient Account Number: [id number] Date of Birth/Sex: Treating RN: [birth year] (86 y.o. Janyth Contes Primary Care Cloyde Oregel: [name] Other Clinician: Referring Ricka Westra: Treating Alekzander Cardell/Extender: Cristie Hem, Crist Fat in Treatment: 0 Active Inactive Abuse / Safety / Falls / Self Care Management Nursing Diagnoses: Impaired physical mobility Potential for falls Goals: Patient will not experience any injury related to falls Date Initiated:  04/15/2022 Target Resolution Date: 06/03/2022 Goal Status: Active Patient/caregiver will verbalize/demonstrate measures taken to improve the patient's personal safety Date Initiated: 04/15/2022 Target Resolution Date: 06/03/2022 Goal Status: Active Interventions: Provide education on fall prevention Notes: Wound/Skin Impairment Nursing Diagnoses: Impaired tissue integrity Knowledge deficit related to ulceration/compromised skin integrity Goals: Patient/caregiver will verbalize understanding of skin care regimen Date Initiated: 04/15/2022 Target Resolution Date: 06/03/2022 Goal Status: Active Ulcer/skin breakdown will have a volume reduction of 30% by week 4 Date Initiated: 04/15/2022 Target Resolution Date: 06/03/2022 Goal Status: Active Interventions: Assess ulceration(s) every visit Treatment Activities: Skin care regimen initiated : 04/15/2022 Topical wound management initiated : 04/15/2022 Notes: Electronic Signature(s) Signed: 04/15/2022 4:00:02 PM By: Adline Peals Entered By: Adline Peals on 04/15/2022 14:03:26 -------------------------------------------------------------------------------- Pain Assessment Details Patient Name: Date of Service: [name], [name] 04/15/2022 1:30 PM Medical Record Number: [id number] Patient Account Number: [id number] Date of Birth/Sex: Treating RN: [birth year] (86 y.o. Janyth Contes Primary Care Marites Nath: [name] Other Clinician: Referring Jeneva Schweizer: Treating Vanissa Strength/Extender: Garlan Fair in Treatment: 0 Active Problems Location of Pain Severity and Description of Pain Patient Has Paino No Site Locations Rate the pain. Current Pain Level: 0 Pain Management and Medication Current Pain Management: Electronic Signature(s) Signed: 04/15/2022 4:00:02 PM By: Adline Peals Entered By: Adline Peals on 04/15/2022  13:57:08 -------------------------------------------------------------------------------- Patient/Caregiver Education Details Patient Name: Date of Service: [name], [name]  8/18/2023andnbsp1:30 PM Medical Record Number: [id number] Patient Account Number: [id number] Date of Birth/Gender: Treating RN: 1935/10/19 (86 y.o. Janyth Contes Primary Care Physician: [name] Other Clinician: Referring Physician: Treating Physician/Extender: Garlan Fair in Treatment: 0 Education Assessment Education Provided To: Patient Education Topics Provided Wound/Skin Impairment: Methods: Explain/Verbal Responses: Reinforcements needed, State content correctly Electronic Signature(s) Signed: 04/15/2022 4:00:02 PM By: Adline Peals Entered By: Adline Peals on 04/15/2022 14:03:35 -------------------------------------------------------------------------------- Wound Assessment Details Patient Name: Date of Service: [name], [name] 04/15/2022 1:30 PM Medical Record Number: [id number] Patient Account Number: [id number] Date of Birth/Sex: Treating RN: June 08, 1936 (86 y.o. Janyth Contes Primary Care Steen Bisig: [name] Other Clinician: Referring Pearley Baranek: Treating Liat Mayol/Extender: Cristie Hem, Crist Fat in Treatment: 0 Wound Status Wound Number: 11 Primary Venous Leg Ulcer Etiology: Wound Location: Left, Anterior Lower Leg Wound Open Wounding Event: Blister Status: Date Acquired: 01/27/2022 Comorbid Cataracts, Glaucoma, Chronic Obstructive Pulmonary Disease Weeks Of Treatment: 0 History: (COPD), Arrhythmia, Congestive Heart Failure, Coronary Artery Clustered Wound: No Disease, Hypertension, Myocardial Infarction, Type II Diabetes, Gout, Neuropathy Photos Wound Measurements Length: (cm) 2 Width: (cm) 1.2 Depth: (cm) 0.1 Area: (cm) 1.885 Volume: (cm) 0.188 % Reduction in Area: 0% % Reduction in Volume:  0% Epithelialization: None Tunneling: No Undermining: No Wound Description Classification: Full Thickness Without Exposed Support Structures Wound Margin: Distinct, outline attached Exudate Amount: Medium Exudate Type: Serosanguineous Exudate Color: red, brown Foul Odor After Cleansing: No Slough/Fibrino Yes Wound Bed Granulation Amount: Large (67-100%) Exposed Structure Granulation Quality: Red Fascia Exposed: No Necrotic Amount: Small (1-33%) Fat Layer (Subcutaneous Tissue) Exposed: Yes Necrotic Quality: Adherent Slough Tendon Exposed: No Muscle Exposed: No Joint Exposed: No Bone Exposed: No Treatment Notes Wound #11 (Lower Leg) Wound Laterality: Left, Anterior Cleanser Soap and Water Discharge Instruction: May shower and wash wound with dial antibacterial soap and water prior to dressing change. Wound Cleanser Discharge Instruction: Cleanse the wound with wound cleanser prior to applying a clean dressing using gauze sponges, not tissue or cotton balls. Peri-Wound Care Sween Lotion (Moisturizing lotion) Discharge Instruction: Apply moisturizing lotion as directed Topical Primary Dressing KerraCel Ag Gelling Fiber Dressing, 2x2 in (silver alginate) Discharge Instruction: Apply silver alginate to wound bed as instructed Secondary Dressing Woven Gauze Sponge, Non-Sterile 4x4 in Discharge Instruction: Apply over primary dressing as directed. Secured With [employer] 4x5 (in/yd) Discharge Instruction: Secure with Coban as directed. Kerlix Roll Sterile, 4.5x3.1 (in/yd) Discharge Instruction: Secure with Kerlix as directed. 61M Medipore H Soft Cloth Surgical T ape, 4 x 10 (in/yd) Discharge Instruction: Secure with tape as directed. Compression Wrap Compression Stockings Add-Ons Electronic Signature(s) Signed: 04/15/2022 4:00:02 PM By: Adline Peals Entered By: Adline Peals on 04/15/2022  13:55:46 -------------------------------------------------------------------------------- Wound Assessment Details Patient Name: Date of Service: [name], [name] 04/15/2022 1:30 PM Medical Record Number: [id number] Patient Account Number: [id number] Date of Birth/Sex: Treating RN: 01-Feb-1936 (86 y.o. Janyth Contes Primary Care Aryana Wonnacott: [name] Other Clinician: Referring Jomarie Gellis: Treating Saraann Enneking/Extender: Cristie Hem, Crist Fat in Treatment: 0 Wound Status Wound Number: 12 Primary Venous Leg Ulcer Etiology: Wound Location: Right, Anterior Lower Leg Wound Open Wounding Event: Blister Status: Date Acquired: 01/27/2022 Comorbid Cataracts, Glaucoma, Chronic Obstructive Pulmonary Disease Weeks Of Treatment: 0 History: (COPD), Arrhythmia, Congestive Heart Failure, Coronary Artery Clustered Wound: No Disease, Hypertension, Myocardial Infarction, Type II Diabetes, Gout, Neuropathy Photos Wound Measurements Length: (cm) 0.6 Width: (cm) 0.5 Depth: (cm) 0.1 Area: (cm) 0.236 Volume: (cm) 0.024 % Reduction in Area:  0% % Reduction in Volume: 0% Epithelialization: Small (1-33%) Tunneling: No Undermining: No Wound Description Classification: Full Thickness Without Exposed Support Structures Wound Margin: Distinct, outline attached Exudate Amount: Medium Exudate Type: Serosanguineous Exudate Color: red, brown Foul Odor After Cleansing: No Slough/Fibrino No Wound Bed Granulation Amount: Large (67-100%) Exposed Structure Granulation Quality: Red Fascia Exposed: No Necrotic Amount: Small (1-33%) Fat Layer (Subcutaneous Tissue) Exposed: Yes Necrotic Quality: Eschar Tendon Exposed: No Muscle Exposed: No Joint Exposed: No Bone Exposed: No Treatment Notes Wound #12 (Lower Leg) Wound Laterality: Right, Anterior Cleanser Soap and Water Discharge Instruction: May shower and wash wound with dial antibacterial soap and water prior to dressing  change. Wound Cleanser Discharge Instruction: Cleanse the wound with wound cleanser prior to applying a clean dressing using gauze sponges, not tissue or cotton balls. Peri-Wound Care Sween Lotion (Moisturizing lotion) Discharge Instruction: Apply moisturizing lotion as directed Topical Primary Dressing KerraCel Ag Gelling Fiber Dressing, 2x2 in (silver alginate) Discharge Instruction: Apply silver alginate to wound bed as instructed Secondary Dressing Woven Gauze Sponge, Non-Sterile 4x4 in Discharge Instruction: Apply over primary dressing as directed. Secured With [employer] 4x5 (in/yd) Discharge Instruction: Secure with Coban as directed. Kerlix Roll Sterile, 4.5x3.1 (in/yd) Discharge Instruction: Secure with Kerlix as directed. 61M Medipore H Soft Cloth Surgical T ape, 4 x 10 (in/yd) Discharge Instruction: Secure with tape as directed. Compression Wrap Compression Stockings Add-Ons Electronic Signature(s) Signed: 04/15/2022 4:00:02 PM By: Adline Peals Entered By: Adline Peals on 04/15/2022 13:56:09 -------------------------------------------------------------------------------- Wound Assessment Details Patient Name: Date of Service: [name], [name] 04/15/2022 1:30 PM Medical Record Number: [id number] Patient Account Number: [id number] Date of Birth/Sex: Treating RN: [birth year] (86 y.o. Janyth Contes Primary Care Lollie Gunner: [name] Other Clinician: Referring Jeilyn Reznik: Treating Krisi Azua/Extender: Cristie Hem, Crist Fat in Treatment: 0 Wound Status Wound Number: 13 Primary Venous Leg Ulcer Etiology: Wound Location: Right, Medial Lower Leg Wound Open Wounding Event: Blister Status: Date Acquired: 01/27/2022 Comorbid Cataracts, Glaucoma, Chronic Obstructive Pulmonary Disease Weeks Of Treatment: 0 History: (COPD), Arrhythmia, Congestive Heart Failure, Coronary Artery Clustered Wound: No Disease, Hypertension,  Myocardial Infarction, Type II Diabetes, Gout, Neuropathy Photos Wound Measurements Length: (cm) 1.5 Width: (cm) 0.8 Depth: (cm) 0.1 Area: (cm) 0.942 Volume: (cm) 0.094 % Reduction in Area: 0% % Reduction in Volume: 0% Epithelialization: Small (1-33%) Tunneling: No Undermining: No Wound Description Classification: Full Thickness Without Exposed Support Structures Wound Margin: Distinct, outline attached Exudate Amount: Medium Exudate Type: Serosanguineous Exudate Color: red, brown Foul Odor After Cleansing: No Slough/Fibrino Yes Wound Bed Granulation Amount: Large (67-100%) Exposed Structure Granulation Quality: Red Fascia Exposed: No Necrotic Amount: Small (1-33%) Fat Layer (Subcutaneous Tissue) Exposed: Yes Necrotic Quality: Adherent Slough Tendon Exposed: No Muscle Exposed: No Joint Exposed: No Bone Exposed: No Treatment Notes Wound #13 (Lower Leg) Wound Laterality: Right, Medial Cleanser Soap and Water Discharge Instruction: May shower and wash wound with dial antibacterial soap and water prior to dressing change. Wound Cleanser Discharge Instruction: Cleanse the wound with wound cleanser prior to applying a clean dressing using gauze sponges, not tissue or cotton balls. Peri-Wound Care Sween Lotion (Moisturizing lotion) Discharge Instruction: Apply moisturizing lotion as directed Topical Primary Dressing KerraCel Ag Gelling Fiber Dressing, 2x2 in (silver alginate) Discharge Instruction: Apply silver alginate to wound bed as instructed Secondary Dressing Woven Gauze Sponge, Non-Sterile 4x4 in Discharge Instruction: Apply over primary dressing as directed. Secured With [employer] 4x5 (in/yd) Discharge Instruction: Secure with Coban as directed. Kerlix Roll Sterile, 4.5x3.1 (in/yd)  Discharge Instruction: Secure with Kerlix as directed. 77M Medipore H Soft Cloth Surgical T ape, 4 x 10 (in/yd) Discharge Instruction: Secure with tape as  directed. Compression Wrap Compression Stockings Add-Ons Electronic Signature(s) Signed: 04/15/2022 4:00:02 PM By: Adline Peals Entered By: Adline Peals on 04/15/2022 13:56:53 -------------------------------------------------------------------------------- Vitals Details Patient Name: Date of Service: [name], [name] 04/15/2022 1:30 PM Medical Record Number: [id number] Patient Account Number: [id number] Date of Birth/Sex: Treating RN: [birth year] (86 y.o. Janyth Contes Primary Care Joretta Eads: [name] Other Clinician: Referring Savir Blanke: Treating Ashleen Demma/Extender: Cristie Hem, Crist Fat in Treatment: 0 Vital Signs Time Taken: 13:42 Temperature (F): 97.6 Height (in): 69 Pulse (bpm): 67 Source: Stated Respiratory Rate (breaths/min): 18 Weight (lbs): 175 Blood Pressure (mmHg): 147/66 Source: Stated Capillary Blood Glucose (mg/dl): 90 Body Mass Index (BMI): 25.8 Reference Range: 80 - 120 mg / dl Electronic Signature(s) Signed: 04/15/2022 4:00:02 PM By: Adline Peals Entered By: Adline Peals on 04/15/2022 13:43:04

## 2022-04-15 NOTE — Progress Notes (Signed)
Budai, Randeep M. ([id number]) Visit Report for 04/15/2022 Abuse Risk Screen Details Patient Name: Date of Service: [name] ID M. 04/15/2022 1:30 PM Medical Record Number: [id number] Patient Account Number: [id number] Date of Birth/Sex: Treating RN: February 24, 1936 (86 y.o. Janyth Contes Primary Care Soma Bachand: [name] Other Clinician: Referring Matis Monnier: Treating Davontae Prusinski/Extender: Cristie Hem, Crist Fat in Treatment: 0 Abuse Risk Screen Items Answer ABUSE RISK SCREEN: Has anyone close to you tried to hurt or harm you recentlyo No Do you feel uncomfortable with anyone in your familyo No Has anyone forced you do things that you didnt want to doo No Electronic Signature(s) Signed: 04/15/2022 4:00:02 PM By: Adline Peals Entered By: Adline Peals on 04/15/2022 13:45:19 -------------------------------------------------------------------------------- Activities of Daily Living Details Patient Name: Date of Service: Larey Days ID M. 04/15/2022 1:30 PM Medical Record Number: [id number] Patient Account Number: [id number] Date of Birth/Sex: Treating RN: 07-07-1936 (86 y.o. Janyth Contes Primary Care Jakota Manthei: [name] Other Clinician: Referring Arryana Tolleson: Treating Kingdavid Leinbach/Extender: Cristie Hem, Crist Fat in Treatment: 0 Activities of Daily Living Items Answer Activities of Daily Living (Please select one for each item) Drive Automobile Not Able T Medications ake Completely Able Use T elephone Completely Able Care for Appearance Completely Able Use T oilet Completely Able Bath / Shower Completely Able Dress Self Completely Able Feed Self Completely Able Walk Completely Able Get In / Out Bed Completely Able Housework Completely Able Prepare Meals Completely [place] Completely Able Shop for Self Need Assistance Electronic Signature(s) Signed: 04/15/2022 4:00:02 PM By: Adline Peals Entered By:  Adline Peals on 04/15/2022 13:45:38 -------------------------------------------------------------------------------- Education Screening Details Patient Name: Date of Service: [name], [name] 04/15/2022 1:30 PM Medical Record Number: [id number] Patient Account Number: [id number] Date of Birth/Sex: Treating RN: 06-May-1936 (86 y.o. Janyth Contes Primary Care Kitiara Hintze: [name] Other Clinician: Referring Ovid Witman: Treating Arlisha Patalano/Extender: Garlan Fair in Treatment: 0 Primary Learner Assessed: Patient Learning Preferences/Education Level/Primary Language Learning Preference: Explanation, Demonstration, Video, Printed Material Highest Education Level: College or Above Preferred Language: English Cognitive Barrier Language Barrier: No Translator Needed: No Memory Deficit: No Emotional Barrier: No Cultural/Religious Beliefs Affecting Medical Care: No Physical Barrier Impaired Vision: Yes Impaired Hearing: No Decreased Hand dexterity: No Knowledge/Comprehension Knowledge Level: Medium Comprehension Level: Medium Ability to understand written instructions: Medium Ability to understand verbal instructions: Medium Motivation Anxiety Level: Calm Cooperation: Cooperative Education Importance: Acknowledges Need Interest in Health Problems: Asks Questions Perception: Coherent Willingness to Engage in Self-Management Medium Activities: Readiness to Engage in Self-Management Medium Activities: Electronic Signature(s) Signed: 04/15/2022 4:00:02 PM By: Adline Peals Entered By: Adline Peals on 04/15/2022 13:46:03 -------------------------------------------------------------------------------- Fall Risk Assessment Details Patient Name: Date of Service: [name], [name] 04/15/2022 1:30 PM Medical Record Number: [id number] Patient Account Number: [id number] Date of Birth/Sex: Treating RN: 04/22/1936 (86 y.o. Janyth Contes Primary Care Kayleena Eke: [name] Other Clinician: Referring Yazmyn Valbuena: Treating Erion Hermans/Extender: Cristie Hem, Crist Fat in Treatment: 0 Fall Risk Assessment Items Have you had 2 or more falls in the last 12 monthso 0 No Have you had any fall that resulted in injury in the last 12 monthso 0 No FALLS RISK SCREEN History of falling - immediate or within 3 months 0 No Secondary diagnosis (Do you have 2 or more medical diagnoseso) 0 No Ambulatory aid None/bed rest/wheelchair/nurse 0 Yes Crutches/cane/walker 0 No Furniture 0 No Intravenous therapy Access/Saline/Heparin Lock 0 No Gait/Transferring Normal/ bed rest/ wheelchair 0 No  Weak (short steps with or without shuffle, stooped but able to lift head while walking, may seek 10 Yes support from furniture) Impaired (short steps with shuffle, may have difficulty arising from chair, head down, impaired 20 Yes balance) Mental Status Oriented to own ability 0 No Electronic Signature(s) Signed: 04/15/2022 4:00:02 PM By: Adline Peals Entered By: Adline Peals on 04/15/2022 13:46:15 -------------------------------------------------------------------------------- Foot Assessment Details Patient Name: Date of Service: [name], [name] 04/15/2022 1:30 PM Medical Record Number: [id number] Patient Account Number: [id number] Date of Birth/Sex: Treating RN: May 11, 1936 (86 y.o. Janyth Contes Primary Care Addalie Calles: [name] Other Clinician: Referring Tamie Minteer: Treating Uzma Hellmer/Extender: Cristie Hem, Crist Fat in Treatment: 0 Foot Assessment Items Site Locations + = Sensation present, - = Sensation absent, C = Callus, U = Ulcer R = Redness, W = Warmth, M = Maceration, PU = Pre-ulcerative lesion F = Fissure, S = Swelling, D = Dryness Assessment Right: Left: Other Deformity: No No Prior Foot Ulcer: No No Prior Amputation: No No Charcot Joint: No No Ambulatory Status:  Ambulatory Without Help Gait: Unsteady Electronic Signature(s) Signed: 04/15/2022 4:00:02 PM By: Adline Peals Entered By: Adline Peals on 04/15/2022 13:47:50 -------------------------------------------------------------------------------- Nutrition Risk Screening Details Patient Name: Date of Service: [name], Shaune Pascal ID M. 04/15/2022 1:30 PM Medical Record Number: [id number] Patient Account Number: [id number] Date of Birth/Sex: Treating RN: 20-Oct-1935 (86 y.o. Janyth Contes Primary Care Rune Mendez: [name] Other Clinician: Referring Judas Mohammad: Treating Cherrise Occhipinti/Extender: Cristie Hem, Dalton Weeks in Treatment: 0 Height (in): 69 Weight (lbs): 175 Body Mass Index (BMI): 25.8 Nutrition Risk Screening Items Score Screening NUTRITION RISK SCREEN: I have an illness or condition that made me change the kind and/or amount of food I eat 0 No I eat fewer than two meals per day 0 No I eat few fruits and vegetables, or milk products 0 No I have three or more drinks of beer, liquor or wine almost every day 0 No I have tooth or mouth problems that make it hard for me to eat 0 No I don't always have enough money to buy the food I need 0 No I eat alone most of the time 0 No I take three or more different prescribed or over-the-counter drugs a day 1 Yes Without wanting to, I have lost or gained 10 pounds in the last six months 0 No I am not always physically able to shop, cook and/or feed myself 0 No Nutrition Protocols Good Risk Protocol 0 No interventions needed Moderate Risk Protocol High Risk Proctocol Risk Level: Good Risk Score: 1 Electronic Signature(s) Signed: 04/15/2022 4:00:02 PM By: Adline Peals Entered By: Adline Peals on 04/15/2022 13:46:29

## 2022-04-15 NOTE — Progress Notes (Signed)
Galicia, Claiborne M. ([id number]) Visit Report for 04/15/2022 Chief Complaint Document Details Patient Name: Date of Service: [name] ID M. 04/15/2022 1:30 PM Medical Record Number: [id number] Patient Account Number: [id number] Date of Birth/Sex: Treating RN: [birth year] (86 y.o. Vincent [name] Primary Care Provider: [name] Other Clinician: Referring Provider: Treating Provider/Extender: Cristie Hem, Crist Fat in Treatment: 0 Information Obtained from: Patient Chief Complaint 07/15/2021 patient is here for wounds on his bilateral lower legs 04/15/2022: The patient returns to clinic with new bilateral leg wounds. Electronic Signature(s) Signed: 04/15/2022 2:21:56 PM By: Fredirick Maudlin MD FACS Entered By: Fredirick Maudlin on 04/15/2022 14:21:56 -------------------------------------------------------------------------------- Debridement Details Patient Name: Date of Service: [name], [name] 04/15/2022 1:30 PM Medical Record Number: [id number] Patient Account Number: [id number] Date of Birth/Sex: Treating RN: 1936/01/27 (86 y.o. Vincent [name] Primary Care Provider: [name] Other Clinician: Referring Provider: Treating Provider/Extender: Garlan Fair in Treatment: 0 Debridement Performed for Assessment: Wound #13 Right,Medial Lower Leg Performed By: Physician Fredirick Maudlin, MD Debridement Type: Debridement Severity of Tissue Pre Debridement: Fat layer exposed Level of Consciousness (Pre-procedure): Awake and Alert Pre-procedure Verification/Time Out Yes - 14:04 Taken: Start Time: 14:04 Pain Control: Lidocaine 4% T opical Solution T Area Debrided (L x W): otal 2 (cm) x 2.5 (cm) = 5 (cm) Tissue and other material debrided: Non-Viable, Eschar Level: Non-Viable Tissue Debridement Description: Selective/Open Wound Instrument: Curette Bleeding: Minimum Hemostasis Achieved: Pressure Procedural Pain: 0 Post  Procedural Pain: 0 Response to Treatment: Procedure was tolerated well Level of Consciousness (Post- Awake and Alert procedure): Post Debridement Measurements of Total Wound Length: (cm) 1.5 Width: (cm) 0.8 Depth: (cm) 0.1 Volume: (cm) 0.094 Character of Wound/Ulcer Post Debridement: Improved Severity of Tissue Post Debridement: Fat layer exposed Post Procedure Diagnosis Same as Pre-procedure Electronic Signature(s) Signed: 04/15/2022 4:00:02 PM By: Adline Peals Signed: 04/15/2022 4:05:21 PM By: Fredirick Maudlin MD FACS Entered By: Adline Peals on 04/15/2022 14:05:43 -------------------------------------------------------------------------------- Debridement Details Patient Name: Date of Service: [name], [name] 04/15/2022 1:30 PM Medical Record Number: [id number] Patient Account Number: [id number] Date of Birth/Sex: Treating RN: 1936/07/18 (86 y.o. Vincent [name] Primary Care Provider: [name] Other Clinician: Referring Provider: Treating Provider/Extender: Garlan Fair in Treatment: 0 Debridement Performed for Assessment: Wound #12 Right,Anterior Lower Leg Performed By: Physician Fredirick Maudlin, MD Debridement Type: Debridement Severity of Tissue Pre Debridement: Fat layer exposed Level of Consciousness (Pre-procedure): Awake and Alert Pre-procedure Verification/Time Out Yes - 14:04 Taken: Start Time: 14:04 Pain Control: Lidocaine 4% T opical Solution T Area Debrided (L x W): otal 0.6 (cm) x 0.5 (cm) = 0.3 (cm) Tissue and other material debrided: Non-Viable, Eschar, Slough, Slough Level: Non-Viable Tissue Debridement Description: Selective/Open Wound Instrument: Curette Bleeding: Minimum Hemostasis Achieved: Pressure Procedural Pain: 0 Post Procedural Pain: 0 Response to Treatment: Procedure was tolerated well Level of Consciousness (Post- Awake and Alert procedure): Post Debridement Measurements of Total  Wound Length: (cm) 0.6 Width: (cm) 0.5 Depth: (cm) 0.1 Volume: (cm) 0.024 Character of Wound/Ulcer Post Debridement: Improved Severity of Tissue Post Debridement: Fat layer exposed Post Procedure Diagnosis Same as Pre-procedure Electronic Signature(s) Signed: 04/15/2022 4:00:02 PM By: Adline Peals Signed: 04/15/2022 4:05:21 PM By: Fredirick Maudlin MD FACS Entered By: Adline Peals on 04/15/2022 14:06:21 -------------------------------------------------------------------------------- Debridement Details Patient Name: Date of Service: [name], [name] 04/15/2022 1:30 PM Medical Record Number: [id number] Patient Account Number: [id number] Date of Birth/Sex: Treating RN: [birth year] (86 y.o. M)  Adline Peals Primary Care Provider: [name] Other Clinician: Referring Provider: Treating Provider/Extender: Garlan Fair in Treatment: 0 Debridement Performed for Assessment: Wound #11 Left,Anterior Lower Leg Performed By: Physician Fredirick Maudlin, MD Debridement Type: Debridement Severity of Tissue Pre Debridement: Fat layer exposed Level of Consciousness (Pre-procedure): Awake and Alert Pre-procedure Verification/Time Out Yes - 14:04 Taken: Start Time: 14:04 Pain Control: Lidocaine 4% T opical Solution T Area Debrided (L x W): otal 4 (cm) x 2.5 (cm) = 10 (cm) Tissue and other material debrided: Non-Viable, Eschar, Slough, Slough Level: Non-Viable Tissue Debridement Description: Selective/Open Wound Instrument: Curette Bleeding: Minimum Hemostasis Achieved: Pressure Procedural Pain: 0 Post Procedural Pain: 0 Response to Treatment: Procedure was tolerated well Level of Consciousness (Post- Awake and Alert procedure): Post Debridement Measurements of Total Wound Length: (cm) 2 Width: (cm) 1.2 Depth: (cm) 0.1 Volume: (cm) 0.188 Character of Wound/Ulcer Post Debridement: Improved Severity of Tissue Post Debridement: Fat layer  exposed Post Procedure Diagnosis Same as Pre-procedure Electronic Signature(s) Signed: 04/15/2022 4:00:02 PM By: Adline Peals Signed: 04/15/2022 4:05:21 PM By: Fredirick Maudlin MD FACS Entered By: Adline Peals on 04/15/2022 14:07:42 -------------------------------------------------------------------------------- HPI Details Patient Name: Date of Service: [name], [name] 04/15/2022 1:30 PM Medical Record Number: [id number] Patient Account Number: [id number] Date of Birth/Sex: Treating RN: March 30, 1936 (86 y.o. Vincent [name] Primary Care Provider: [name] Other Clinician: Referring Provider: Treating Provider/Extender: Cristie Hem, Crist Fat in Treatment: 0 History of Present Illness HPI Description: ADMISSION 07/15/2021 This is an 86 year old man who came here for evaluation of wounds on his bilateral predominantly anterior lower legs mid aspect of the tibia. He seems to have been dealing with this for most of this year. Currently with 3 wounds on the right and 2 on the left. At least some of these seem to start off as blisters probably the majority. He has been following with Dr. Anabel Bene of [facility] dermatology according the patient back he has an appointment Dr. Anabel Bene tomorrow he was given a prescription for triamcinolone which she has been applying daily in fact it was renewed yesterday. Patient is a diabetic no recent hemoglobin A1c. He is on Coumadin for persistent atrial fibrillation. Vein and vascular in December 2021 at which time he had bilateral foot pain and finger pain. ABIs bilaterally were noncompressible however the TBI on the right was only 0.39. There were biphasic waveforms on the left his ABI was noncompressible with biphasic waveforms but with a TBI of 0.6. He was not felt to have a primary vascular issue. There is a mention of Raynaud's phenomenon as well. Past medical history includes cellulitis of the right leg in June  2022, type 2 diabetes, persistent lower extremity edema, bilateral foot and ankle pain, Raynaud's phenomenon, coronary artery disease, congestive heart failure, pacemaker, none sustained ventricular tachycardia, remote CVA paroxysmal atrial fib on Coumadin. We did not reattempt ABIs in our clinic 07/22/2019 patient appears to be doing decently well in regard to his wounds as compared to last week with the wounds that were present last week. Fortunately I do not see any signs of active infection which is great news. No fevers, chills, nausea, vomiting, or diarrhea. With that being said he has several new blisters that are getting need to be addressed today. 07/28/2021 upon evaluation today patient actually appears to be making excellent progress. I am extremely pleased with where we stand today. I think that his wounds are significantly improved compared to where they were previous. 08/04/2021 upon  evaluation today patient appears to be doing much better in regard to his wounds in general. I feel like across the board he is showing signs of significant improvement. Fortunately there is no evidence of infection which is great news. No fevers, chills, nausea, vomiting, or diarrhea. 08/11/2021 upon evaluation today patient actually appears to be doing quite well in regard to his wound. Has been tolerating the dressing changes without complication. Fortunately I do not see any signs of infection currently which is great news as well. No fevers, chills, nausea, vomiting, or diarrhea. 08/18/2021 on evaluation today patient appears to be doing excellent he is making good progress and overall I am extremely pleased with where we stand today. There does not appear to be any signs of active infection at this time which is great news. 09/01/2020 upon evaluation patient's wounds on both the left and the right appear to be at 0.1 cm measurement and are very close to complete resolution. Were not quite completely done  yet but this is very close. Overall I am extremely happy with where things stand and I think he is headed in the right direction. 09/08/2021 upon evaluation today patient appears to be doing well in regard to his right leg which is showing signs of being completely healed which is excellent. Fortunately I do not see any evidence of active infection locally nor systemically at this point which is also great news. No fevers, chills, nausea, vomiting, or diarrhea. 09/15/2021; this is a patient we have been following for now a small wound on the left dorsal foot. He had wounds on the right legs and was discharged to 20/30 below-knee stockings apparently in late December. He came in today with marked increase in swelling in the right leg large areas of denuded skin which probably started as blisters. He did not have a stocking on 10/06/2021 upon evaluation today patient appears to be doing decently well in regard to his wounds. On the left leg he is completely healed on the right leg he does still have a small area that is open although again its not as good as it was last time I saw him is also not as bad as it was in the beginning. He has been in the hospital and they did not wrap him as part of the issue I believe here as well. 10/20/2021 upon evaluation today patient appears to be doing well with regard to his leg ulcer. He has been tolerating the dressing changes without complication. Fortunately I do not see any signs of active infection locally or systemically at this point. No fevers, chills, nausea, vomiting, or diarrhea. 10/27/2021 upon evaluation today patient appears to be doing well with regard to his wound. In fact this appears to be almost completely healed which is great news. Fortunately I do not see any evidence of active infection locally nor systemically which is great news. No fevers, chills, nausea, vomiting, or diarrhea. 11/03/2021 upon evaluation today patient is very close to complete  resolution he just has a very tiny area on the right leg still open at this point. He did go get socks from [place] as I discussed with him at the last visit unfortunately he actually got diabetic socks and compression socks. I explained those are not the same thing and he voiced understanding he is going to go to a different [facility] and try to find exactly what we are looking for. Fortunately I do not see any signs of infection and I think he  is very close to discharge will probably be ready for next week. 11/10/2021: T oday the patient's right lower extremity wound is closed. He was able to get the low grade compression socks from [employer] and is wearing 1 on the left leg. READMISSION 04/15/2022 The patient returns to clinic today with new bilateral lower extremity leg wounds. He says that he has not been using moisturizer regularly and is not wearing his compression stockings. The wounds are superficial. He has eschar buildup on the surfaces and thickened dry, cracked skin. Under the eschar, there is a little bit of slough on the wound surfaces. Electronic Signature(s) Signed: 04/15/2022 2:23:21 PM By: Fredirick Maudlin MD FACS Entered By: Fredirick Maudlin on 04/15/2022 14:23:21 -------------------------------------------------------------------------------- Physical Exam Details Patient Name: Date of Service: [name], [name] 04/15/2022 1:30 PM Medical Record Number: [id number] Patient Account Number: [id number] Date of Birth/Sex: Treating RN: September 07, 1935 (86 y.o. Vincent [name] Primary Care Provider: [name] Other Clinician: Referring Provider: Treating Provider/Extender: Cristie Hem, Crist Fat in Treatment: 0 Constitutional Slightly hypertensive. . . . No acute distress. Respiratory Normal work of breathing on room air.. Notes 04/15/2022: The wounds are superficial. He has eschar buildup on the surfaces and thickened dry, cracked skin. Under the eschar, there  is a little bit of slough on the wound surfaces. Electronic Signature(s) Signed: 04/15/2022 2:23:56 PM By: Fredirick Maudlin MD FACS Entered By: Fredirick Maudlin on 04/15/2022 14:23:55 -------------------------------------------------------------------------------- Physician Orders Details Patient Name: Date of Service: [name], [name] 04/15/2022 1:30 PM Medical Record Number: [id number] Patient Account Number: [id number] Date of Birth/Sex: Treating RN: 1936/07/13 (86 y.o. Vincent [name] Primary Care Provider: [name] Other Clinician: Referring Provider: Treating Provider/Extender: Cristie Hem, Crist Fat in Treatment: 0 Verbal / Phone Orders: No Diagnosis Coding ICD-10 Coding Code Description [phone] Non-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 leg limited to breakdown of skin L97.811 Non-pressure chronic ulcer of other part of right lower leg limited to breakdown of skin I87.2 Venous insufficiency (chronic) (peripheral) I50.9 Heart failure, unspecified E11.622 Type 2 diabetes mellitus with other skin ulcer Follow-up Appointments ppointment in 1 week. - Dr. Celine Ahr - room 2 - 8/28 at 3:00 PM Return A Bathing/ Shower/ Hygiene May shower with protection but do not get wound dressing(s) wet. - may purchase a cast protector from [facility] or [facility] Edema Control - Lymphedema / SCD / Other Bilateral Lower Extremities Elevate legs to the level of the heart or above for 30 minutes daily and/or when sitting, a frequency of: - whenever sitting Avoid standing for long periods of time. Patient to wear own compression stockings every day. Exercise regularly Additional Orders / Instructions Follow Nutritious Diet - Monitor blood sugars Wound Treatment Wound #11 - Lower Leg Wound Laterality: Left, Anterior Cleanser: Soap and Water 1 x Per Week/30 Days Discharge Instructions: May shower and wash wound with dial antibacterial soap and water prior to  dressing change. Cleanser: Wound Cleanser 1 x Per Week/30 Days Discharge Instructions: Cleanse the wound with wound cleanser prior to applying a clean dressing using gauze sponges, not tissue or cotton balls. Peri-Wound Care: Sween Lotion (Moisturizing lotion) 1 x Per Week/30 Days Discharge Instructions: Apply moisturizing lotion as directed Prim Dressing: KerraCel Ag Gelling Fiber Dressing, 2x2 in (silver alginate) 1 x Per Week/30 Days ary Discharge Instructions: Apply silver alginate to wound bed as instructed Secondary Dressing: Woven Gauze Sponge, Non-Sterile 4x4 in 1 x Per Week/30 Days Discharge Instructions: Apply over primary  dressing as directed. Secured With: Coban Self-Adherent Wrap 4x5 (in/yd) 1 x Per Week/30 Days Discharge Instructions: Secure with Coban as directed. Secured With: [employer], 4.5x3.1 (in/yd) 1 x Per Week/30 Days Discharge Instructions: Secure with Kerlix as directed. Secured With: 59M Medipore H Soft Cloth Surgical T ape, 4 x 10 (in/yd) 1 x Per Week/30 Days Discharge Instructions: Secure with tape as directed. Wound #12 - Lower Leg Wound Laterality: Right, Anterior Cleanser: Soap and Water 1 x Per Week/30 Days Discharge Instructions: May shower and wash wound with dial antibacterial soap and water prior to dressing change. Cleanser: Wound Cleanser 1 x Per Week/30 Days Discharge Instructions: Cleanse the wound with wound cleanser prior to applying a clean dressing using gauze sponges, not tissue or cotton balls. Peri-Wound Care: Sween Lotion (Moisturizing lotion) 1 x Per Week/30 Days Discharge Instructions: Apply moisturizing lotion as directed Prim Dressing: KerraCel Ag Gelling Fiber Dressing, 2x2 in (silver alginate) 1 x Per Week/30 Days ary Discharge Instructions: Apply silver alginate to wound bed as instructed Secondary Dressing: Woven Gauze Sponge, Non-Sterile 4x4 in 1 x Per Week/30 Days Discharge Instructions: Apply over primary dressing as  directed. Secured With: Coban Self-Adherent Wrap 4x5 (in/yd) 1 x Per Week/30 Days Discharge Instructions: Secure with Coban as directed. Secured With: [employer], 4.5x3.1 (in/yd) 1 x Per Week/30 Days Discharge Instructions: Secure with Kerlix as directed. Secured With: 59M Medipore H Soft Cloth Surgical T ape, 4 x 10 (in/yd) 1 x Per Week/30 Days Discharge Instructions: Secure with tape as directed. Wound #13 - Lower Leg Wound Laterality: Right, Medial Cleanser: Soap and Water 1 x Per Week/30 Days Discharge Instructions: May shower and wash wound with dial antibacterial soap and water prior to dressing change. Cleanser: Wound Cleanser 1 x Per Week/30 Days Discharge Instructions: Cleanse the wound with wound cleanser prior to applying a clean dressing using gauze sponges, not tissue or cotton balls. Peri-Wound Care: Sween Lotion (Moisturizing lotion) 1 x Per Week/30 Days Discharge Instructions: Apply moisturizing lotion as directed Prim Dressing: KerraCel Ag Gelling Fiber Dressing, 2x2 in (silver alginate) 1 x Per Week/30 Days ary Discharge Instructions: Apply silver alginate to wound bed as instructed Secondary Dressing: Woven Gauze Sponge, Non-Sterile 4x4 in 1 x Per Week/30 Days Discharge Instructions: Apply over primary dressing as directed. Secured With: Coban Self-Adherent Wrap 4x5 (in/yd) 1 x Per Week/30 Days Discharge Instructions: Secure with Coban as directed. Secured With: [employer], 4.5x3.1 (in/yd) 1 x Per Week/30 Days Discharge Instructions: Secure with Kerlix as directed. Secured With: 59M Medipore H Soft Cloth Surgical T ape, 4 x 10 (in/yd) 1 x Per Week/30 Days Discharge Instructions: Secure with tape as directed. Electronic Signature(s) Signed: 04/15/2022 4:05:21 PM By: Fredirick Maudlin MD FACS Entered By: Fredirick Maudlin on 04/15/2022 14:28:30 -------------------------------------------------------------------------------- Problem List Details Patient  Name: Date of Service: [name], [name] 04/15/2022 1:30 PM Medical Record Number: [id number] Patient Account Number: [id number] Date of Birth/Sex: Treating RN: 1935-11-22 (86 y.o. Vincent [name] Primary Care Provider: [name] Other Clinician: Referring Provider: Treating Provider/Extender: Garlan Fair in Treatment: 0 Active Problems ICD-10 Encounter Code Description Active Date MDM Diagnosis [phone] Non-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 leg limited to breakdown 04/15/2022 No Yes of skin L97.811 Non-pressure chronic ulcer of other part of right lower leg limited to breakdown 04/15/2022 No Yes of skin I87.2 Venous insufficiency (chronic) (peripheral) 04/15/2022 No Yes I50.9 Heart failure, unspecified 04/15/2022 No Yes E11.622 Type 2 diabetes  mellitus with other skin ulcer 04/15/2022 No Yes Inactive Problems Resolved Problems Electronic Signature(s) Signed: 04/15/2022 2:19:27 PM By: Fredirick Maudlin MD FACS Entered By: Fredirick Maudlin on 04/15/2022 14:19:27 -------------------------------------------------------------------------------- Progress Note Details Patient Name: Date of Service: [name], [name] 04/15/2022 1:30 PM Medical Record Number: [id number] Patient Account Number: [id number] Date of Birth/Sex: Treating RN: 20-Jun-1936 (86 y.o. Vincent [name] Primary Care Provider: [name] Other Clinician: Referring Provider: Treating Provider/Extender: Cristie Hem, Crist Fat in Treatment: 0 Subjective Chief Complaint Information obtained from Patient 07/15/2021 patient is here for wounds on his bilateral lower legs 04/15/2022: The patient returns to clinic with new bilateral leg wounds. History of Present Illness (HPI) ADMISSION 07/15/2021 This is an 86 year old man who came here for evaluation of wounds on his bilateral predominantly anterior lower legs mid aspect of the tibia. He seems to  have been dealing with this for most of this year. Currently with 3 wounds on the right and 2 on the left. At least some of these seem to start off as blisters probably the majority. He has been following with Dr. Anabel Bene of [facility] dermatology according the patient back he has an appointment Dr. Anabel Bene tomorrow he was given a prescription for triamcinolone which she has been applying daily in fact it was renewed yesterday. Patient is a diabetic no recent hemoglobin A1c. He is on Coumadin for persistent atrial fibrillation. Vein and vascular in December 2021 at which time he had bilateral foot pain and finger pain. ABIs bilaterally were noncompressible however the TBI on the right was only 0.39. There were biphasic waveforms on the left his ABI was noncompressible with biphasic waveforms but with a TBI of 0.6. He was not felt to have a primary vascular issue. There is a mention of Raynaud's phenomenon as well. Past medical history includes cellulitis of the right leg in June 2022, type 2 diabetes, persistent lower extremity edema, bilateral foot and ankle pain, Raynaud's phenomenon, coronary artery disease, congestive heart failure, pacemaker, none sustained ventricular tachycardia, remote CVA paroxysmal atrial fib on Coumadin. We did not reattempt ABIs in our clinic 07/22/2019 patient appears to be doing decently well in regard to his wounds as compared to last week with the wounds that were present last week. Fortunately I do not see any signs of active infection which is great news. No fevers, chills, nausea, vomiting, or diarrhea. With that being said he has several new blisters that are getting need to be addressed today. 07/28/2021 upon evaluation today patient actually appears to be making excellent progress. I am extremely pleased with where we stand today. I think that his wounds are significantly improved compared to where they were previous. 08/04/2021 upon evaluation today patient  appears to be doing much better in regard to his wounds in general. I feel like across the board he is showing signs of significant improvement. Fortunately there is no evidence of infection which is great news. No fevers, chills, nausea, vomiting, or diarrhea. 08/11/2021 upon evaluation today patient actually appears to be doing quite well in regard to his wound. Has been tolerating the dressing changes without complication. Fortunately I do not see any signs of infection currently which is great news as well. No fevers, chills, nausea, vomiting, or diarrhea. 08/18/2021 on evaluation today patient appears to be doing excellent he is making good progress and overall I am extremely pleased with where we stand today. There does not appear to be any signs of active infection at this time  which is great news. 09/01/2020 upon evaluation patient's wounds on both the left and the right appear to be at 0.1 cm measurement and are very close to complete resolution. Were not quite completely done yet but this is very close. Overall I am extremely happy with where things stand and I think he is headed in the right direction. 09/08/2021 upon evaluation today patient appears to be doing well in regard to his right leg which is showing signs of being completely healed which is excellent. Fortunately I do not see any evidence of active infection locally nor systemically at this point which is also great news. No fevers, chills, nausea, vomiting, or diarrhea. 09/15/2021; this is a patient we have been following for now a small wound on the left dorsal foot. He had wounds on the right legs and was discharged to 20/30 below-knee stockings apparently in late December. He came in today with marked increase in swelling in the right leg large areas of denuded skin which probably started as blisters. He did not have a stocking on 10/06/2021 upon evaluation today patient appears to be doing decently well in regard to his wounds. On  the left leg he is completely healed on the right leg he does still have a small area that is open although again its not as good as it was last time I saw him is also not as bad as it was in the beginning. He has been in the hospital and they did not wrap him as part of the issue I believe here as well. 10/20/2021 upon evaluation today patient appears to be doing well with regard to his leg ulcer. He has been tolerating the dressing changes without complication. Fortunately I do not see any signs of active infection locally or systemically at this point. No fevers, chills, nausea, vomiting, or diarrhea. 10/27/2021 upon evaluation today patient appears to be doing well with regard to his wound. In fact this appears to be almost completely healed which is great news. Fortunately I do not see any evidence of active infection locally nor systemically which is great news. No fevers, chills, nausea, vomiting, or diarrhea. 11/03/2021 upon evaluation today patient is very close to complete resolution he just has a very tiny area on the right leg still open at this point. He did go get socks from [place] as I discussed with him at the last visit unfortunately he actually got diabetic socks and compression socks. I explained those are not the same thing and he voiced understanding he is going to go to a different [facility] and try to find exactly what we are looking for. Fortunately I do not see any signs of infection and I think he is very close to discharge will probably be ready for next week. 11/10/2021: T oday the patient's right lower extremity wound is closed. He was able to get the low grade compression socks from [facility] and is wearing 1 on the left leg. READMISSION 04/15/2022 The patient returns to clinic today with new bilateral lower extremity leg wounds. He says that he has not been using moisturizer regularly and is not wearing his compression stockings. The wounds are superficial. He has eschar buildup  on the surfaces and thickened dry, cracked skin. Under the eschar, there is a little bit of slough on the wound surfaces. Patient History Information obtained from Patient, Chart. Allergies No Known Allergies Family History Cancer - Mother,Siblings, Heart Disease - Father, Hypertension - Mother,Father, Thyroid Problems - Siblings, No family history  of Diabetes, Hereditary Spherocytosis, Kidney Disease, Lung Disease, Seizures, Stroke, Tuberculosis. Social History Former smoker, Marital Status - Divorced, Alcohol Use - Never, Drug Use - No History, Caffeine Use - Rarely - coffee. Medical History Eyes Patient has history of Cataracts - left eye, Glaucoma Denies history of Optic Neuritis Respiratory Patient has history of Chronic Obstructive Pulmonary Disease (COPD) Cardiovascular Patient has history of Arrhythmia - afib, Congestive Heart Failure, Coronary Artery Disease, Hypertension, Myocardial Infarction Endocrine Patient has history of Type II Diabetes Denies history of Type I Diabetes Genitourinary Denies history of End Stage Renal Disease Integumentary (Skin) Denies history of History of Burn Musculoskeletal Patient has history of Gout Neurologic Patient has history of Neuropathy Oncologic Denies history of Received Chemotherapy, Received Radiation Psychiatric Denies history of Anorexia/bulimia, Confinement Anxiety Hospitalization/Surgery History - cardiac cath with stents. - colonoscopy. - knee surgery. Medical A Surgical History Notes nd Respiratory pulmonary eosinophilia Cardiovascular hyperlipidemia, alcoholic cardiomyopathy Gastrointestinal GERD Genitourinary incontinence, enlarged prostate Neurologic left foot drop, stroke Review of Systems (ROS) Psychiatric Denies complaints or symptoms of Claustrophobia, Suicidal. Objective Constitutional Slightly hypertensive. No acute distress. Vitals Time Taken: 1:42 PM, Height: 69 in, Source: Stated, Weight: 175 lbs,  Source: Stated, BMI: 25.8, Temperature: 97.6 F, Pulse: 67 bpm, Respiratory Rate: 18 breaths/min, Blood Pressure: 147/66 mmHg, Capillary Blood Glucose: 90 mg/dl. Respiratory Normal work of breathing on room air.. General Notes: 04/15/2022: The wounds are superficial. He has eschar buildup on the surfaces and thickened dry, cracked skin. Under the eschar, there is a little bit of slough on the wound surfaces. Integumentary (Hair, Skin) Wound #11 status is Open. Original cause of wound was Blister. The date acquired was: 01/27/2022. The wound is located on the Left,Anterior Lower Leg. The wound measures 2cm length x 1.2cm width x 0.1cm depth; 1.885cm^2 area and 0.188cm^3 volume. There is Fat Layer (Subcutaneous Tissue) exposed. There is no tunneling or undermining noted. There is a medium amount of serosanguineous drainage noted. The wound margin is distinct with the outline attached to the wound base. There is large (67-100%) red granulation within the wound bed. There is a small (1-33%) amount of necrotic tissue within the wound bed including Adherent Slough. Wound #12 status is Open. Original cause of wound was Blister. The date acquired was: 01/27/2022. The wound is located on the Right,Anterior Lower Leg. The wound measures 0.6cm length x 0.5cm width x 0.1cm depth; 0.236cm^2 area and 0.024cm^3 volume. There is Fat Layer (Subcutaneous Tissue) exposed. There is no tunneling or undermining noted. There is a medium amount of serosanguineous drainage noted. The wound margin is distinct with the outline attached to the wound base. There is large (67-100%) red granulation within the wound bed. There is a small (1-33%) amount of necrotic tissue within the wound bed including Eschar. Wound #13 status is Open. Original cause of wound was Blister. The date acquired was: 01/27/2022. The wound is located on the Right,Medial Lower Leg. The wound measures 1.5cm length x 0.8cm width x 0.1cm depth; 0.942cm^2 area and  0.094cm^3 volume. There is Fat Layer (Subcutaneous Tissue) exposed. There is no tunneling or undermining noted. There is a medium amount of serosanguineous drainage noted. The wound margin is distinct with the outline attached to the wound base. There is large (67-100%) red granulation within the wound bed. There is a small (1-33%) amount of necrotic tissue within the wound bed including Adherent Slough. 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 There was a Selective/Open Wound Non-Viable Tissue Debridement with a total area of 10 sq cm performed by Fredirick Maudlin, MD. With the following instrument(s): Curette to remove Non-Viable tissue/material. Material removed includes Eschar and Slough and after achieving pain control using Lidocaine 4% T opical Solution. No specimens were taken. A time out was conducted at 14:04, prior to the start of the procedure. A Minimum amount of bleeding was controlled with Pressure. The procedure was tolerated well with a pain level of 0 throughout and a pain level of 0 following the procedure. Post Debridement Measurements: 2cm length x 1.2cm width x 0.1cm depth; 0.188cm^3 volume. Character of Wound/Ulcer Post Debridement is improved. Severity of Tissue Post Debridement is: Fat layer exposed. Post procedure Diagnosis Wound #11: Same as Pre-Procedure Wound #12 Pre-procedure diagnosis of Wound #12 is a Venous Leg Ulcer located on the Right,Anterior Lower Leg .Severity of Tissue Pre Debridement is: Fat  layer exposed. There was a Selective/Open Wound Non-Viable Tissue Debridement with a total area of 0.3 sq cm performed by Fredirick Maudlin, MD. With the following instrument(s): Curette to remove Non-Viable tissue/material. Material removed includes Eschar and Slough and after achieving pain control using Lidocaine 4% T opical Solution. No specimens were taken. A time out was conducted at 14:04, prior to the start of the procedure. A Minimum amount of bleeding was controlled with Pressure. The procedure was tolerated well with a pain level of 0 throughout and a pain level of 0 following the procedure. Post Debridement Measurements: 0.6cm length x 0.5cm width x 0.1cm depth; 0.024cm^3 volume. Character of Wound/Ulcer Post Debridement is improved. Severity of Tissue Post Debridement is: Fat layer exposed. Post procedure Diagnosis Wound #12: Same as Pre-Procedure Wound #13 Pre-procedure diagnosis of Wound #13 is a Venous Leg Ulcer located on the Right,Medial Lower Leg .Severity of Tissue Pre Debridement is: Fat layer exposed. There was a Selective/Open Wound Non-Viable Tissue Debridement with a total area of 5 sq cm performed by Fredirick Maudlin, MD. With the following instrument(s): Curette to remove Non-Viable tissue/material. Material removed includes Eschar after achieving pain control using Lidocaine 4% Topical Solution. No specimens were taken. A time out was conducted at 14:04, prior to the start of the procedure. A Minimum amount of bleeding was controlled with Pressure. The procedure was tolerated well with a pain level of 0 throughout and a pain level of 0 following the procedure. Post Debridement Measurements: 1.5cm length x 0.8cm width x 0.1cm depth; 0.094cm^3 volume. Character of Wound/Ulcer Post Debridement is improved. Severity of Tissue Post Debridement is: Fat layer exposed. Post procedure Diagnosis Wound #13: Same as Pre-Procedure Plan Follow-up Appointments: Return Appointment in 1  week. - Dr. Celine Ahr - room 2 - 8/28 at 3:00 PM Bathing/ Shower/ Hygiene: May shower with protection but do not get wound dressing(s) wet. - may purchase a cast protector from [facility] or [facility] Edema Control - Lymphedema / SCD / Other: Elevate legs to the level of the heart or above for 30 minutes daily and/or when sitting, a frequency of: - whenever sitting Avoid standing for long periods of time. Patient to wear own compression stockings every day. Exercise regularly Additional Orders / Instructions:  Follow Nutritious Diet - Monitor blood sugars WOUND #11: - Lower Leg Wound Laterality: Left, Anterior Cleanser: Soap and Water 1 x Per Week/30 Days Discharge Instructions: May shower and wash wound with dial antibacterial soap and water prior to dressing change. Cleanser: Wound Cleanser 1 x Per Week/30 Days Discharge Instructions: Cleanse the wound with wound cleanser prior to applying a clean dressing using gauze sponges, not tissue or cotton balls. Peri-Wound Care: Sween Lotion (Moisturizing lotion) 1 x Per Week/30 Days Discharge Instructions: Apply moisturizing lotion as directed Prim Dressing: KerraCel Ag Gelling Fiber Dressing, 2x2 in (silver alginate) 1 x Per Week/30 Days ary Discharge Instructions: Apply silver alginate to wound bed as instructed Secondary Dressing: Woven Gauze Sponge, Non-Sterile 4x4 in 1 x Per Week/30 Days Discharge Instructions: Apply over primary dressing as directed. Secured With: Coban Self-Adherent Wrap 4x5 (in/yd) 1 x Per Week/30 Days Discharge Instructions: Secure with Coban as directed. Secured With: [employer], 4.5x3.1 (in/yd) 1 x Per Week/30 Days Discharge Instructions: Secure with Kerlix as directed. Secured With: 96M Medipore H Soft Cloth Surgical T ape, 4 x 10 (in/yd) 1 x Per Week/30 Days Discharge Instructions: Secure with tape as directed. WOUND #12: - Lower Leg Wound Laterality: Right, Anterior Cleanser: Soap and Water 1 x Per Week/30  Days Discharge Instructions: May shower and wash wound with dial antibacterial soap and water prior to dressing change. Cleanser: Wound Cleanser 1 x Per Week/30 Days Discharge Instructions: Cleanse the wound with wound cleanser prior to applying a clean dressing using gauze sponges, not tissue or cotton balls. Peri-Wound Care: Sween Lotion (Moisturizing lotion) 1 x Per Week/30 Days Discharge Instructions: Apply moisturizing lotion as directed Prim Dressing: KerraCel Ag Gelling Fiber Dressing, 2x2 in (silver alginate) 1 x Per Week/30 Days ary Discharge Instructions: Apply silver alginate to wound bed as instructed Secondary Dressing: Woven Gauze Sponge, Non-Sterile 4x4 in 1 x Per Week/30 Days Discharge Instructions: Apply over primary dressing as directed. Secured With: Coban Self-Adherent Wrap 4x5 (in/yd) 1 x Per Week/30 Days Discharge Instructions: Secure with Coban as directed. Secured With: [employer], 4.5x3.1 (in/yd) 1 x Per Week/30 Days Discharge Instructions: Secure with Kerlix as directed. Secured With: 96M Medipore H Soft Cloth Surgical T ape, 4 x 10 (in/yd) 1 x Per Week/30 Days Discharge Instructions: Secure with tape as directed. WOUND #13: - Lower Leg Wound Laterality: Right, Medial Cleanser: Soap and Water 1 x Per Week/30 Days Discharge Instructions: May shower and wash wound with dial antibacterial soap and water prior to dressing change. Cleanser: Wound Cleanser 1 x Per Week/30 Days Discharge Instructions: Cleanse the wound with wound cleanser prior to applying a clean dressing using gauze sponges, not tissue or cotton balls. Peri-Wound Care: Sween Lotion (Moisturizing lotion) 1 x Per Week/30 Days Discharge Instructions: Apply moisturizing lotion as directed Prim Dressing: KerraCel Ag Gelling Fiber Dressing, 2x2 in (silver alginate) 1 x Per Week/30 Days ary Discharge Instructions: Apply silver alginate to wound bed as instructed Secondary Dressing: Woven Gauze Sponge,  Non-Sterile 4x4 in 1 x Per Week/30 Days Discharge Instructions: Apply over primary dressing as directed. Secured With: Coban Self-Adherent Wrap 4x5 (in/yd) 1 x Per Week/30 Days Discharge Instructions: Secure with Coban as directed. Secured With: [employer], 4.5x3.1 (in/yd) 1 x Per Week/30 Days Discharge Instructions: Secure with Kerlix as directed. Secured With: 96M Medipore H Soft Cloth Surgical T ape, 4 x 10 (in/yd) 1 x Per Week/30 Days Discharge Instructions: Secure with tape as directed. 04/15/2022: The patient returns with new  open wounds on his bilateral lower extremities. The wounds are superficial. He has eschar buildup on the surfaces and thickened dry, cracked skin. Under the eschar, there is a little bit of slough on the wound surfaces. I used a curette to debride the eschar and slough from each of the wounds. We will liberally moisturize his legs and then apply silver alginate with Kerlix and Coban wrap; due to his severe peripheral vascular disease we are unable to use any more compression than this. Follow-up in 1 week. Electronic Signature(s) Signed: 04/15/2022 2:29:12 PM By: Fredirick Maudlin MD FACS Entered By: Fredirick Maudlin on 04/15/2022 14:29:12 -------------------------------------------------------------------------------- HxROS Details Patient Name: Date of Service: [name], [name] 04/15/2022 1:30 PM Medical Record Number: [id number] Patient Account Number: [id number] Date of Birth/Sex: Treating RN: 1936/04/03 (86 y.o. Vincent [name] Primary Care Provider: [name] Other Clinician: Referring Provider: Treating Provider/Extender: Garlan Fair in Treatment: 0 Information Obtained From Patient Chart Psychiatric Complaints and Symptoms: Negative for: Claustrophobia; Suicidal Medical History: Negative for: Anorexia/bulimia; Confinement Anxiety Eyes Medical History: Positive for: Cataracts - left eye;  Glaucoma Negative for: Optic Neuritis Respiratory Medical History: Positive for: Chronic Obstructive Pulmonary Disease (COPD) Past Medical History Notes: pulmonary eosinophilia Cardiovascular Medical History: Positive for: Arrhythmia - afib; Congestive Heart Failure; Coronary Artery Disease; Hypertension; Myocardial Infarction Past Medical History Notes: hyperlipidemia, alcoholic cardiomyopathy Gastrointestinal Medical History: Past Medical History Notes: GERD Endocrine Medical History: Positive for: Type II Diabetes Negative for: Type I Diabetes Time with diabetes: 2 yrs Treated with: Oral agents Blood sugar tested every day: Yes Tested : Genitourinary Medical History: Negative for: End Stage Renal Disease Past Medical History Notes: incontinence, enlarged prostate Integumentary (Skin) Medical History: Negative for: History of Burn Musculoskeletal Medical History: Positive for: Gout Neurologic Medical History: Positive for: Neuropathy Past Medical History Notes: left foot drop, stroke Oncologic Medical History: Negative for: Received Chemotherapy; Received Radiation HBO Extended History Items Eyes: Eyes: Cataracts Glaucoma Immunizations Pneumococcal Vaccine: Received Pneumococcal Vaccination: Yes Received Pneumococcal Vaccination On or After 60th Birthday: Yes Implantable Devices No devices added Hospitalization / Surgery History Type of Hospitalization/Surgery cardiac cath with stents colonoscopy knee surgery Family and Social History Cancer: Yes - Mother,Siblings; Diabetes: No; Heart Disease: Yes - Father; Hereditary Spherocytosis: No; Hypertension: Yes - Mother,Father; Kidney Disease: No; Lung Disease: No; Seizures: No; Stroke: No; Thyroid Problems: Yes - Siblings; Tuberculosis: No; Former smoker; Marital Status - Divorced; Alcohol Use: Never; Drug Use: No History; Caffeine Use: Rarely - coffee; Financial Concerns: No; Food, Clothing or Shelter Needs:  No; Support System Lacking: No; Transportation Concerns: No Electronic Signature(s) Signed: 04/15/2022 4:00:02 PM By: Adline Peals Signed: 04/15/2022 4:05:21 PM By: Fredirick Maudlin MD FACS Entered By: Adline Peals on 04/15/2022 13:45:15 -------------------------------------------------------------------------------- SuperBill Details Patient Name: Date of Service: [name], [name] 04/15/2022 Medical Record Number: [id number] Patient Account Number: [id number] Date of Birth/Sex: Treating RN: Mar 21, 1936 (86 y.o. Vincent [name] Primary Care Provider: [name] Other Clinician: Referring Provider: Treating Provider/Extender: Cristie Hem, Crist Fat in Treatment: 0 Diagnosis Coding ICD-10 Codes Code Description [phone] Non-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 leg limited to breakdown of skin L97.811 Non-pressure chronic ulcer of other part of right lower leg limited to breakdown of skin I87.2 Venous insufficiency (chronic) (peripheral) I50.9 Heart failure, unspecified E11.622 Type 2 diabetes mellitus with other skin ulcer Facility Procedures CPT4 Code: [id number] Description: 99213 - WOUND CARE VISIT-LEV 3 EST PT Modifier: 25 Quantity: 1 CPT4 Code:  [id number] Description: [phone] - DEBRIDE WOUND 1ST 20 SQ CM OR < ICD-10 Diagnosis Description 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 L97.811 Non-pressure chronic ulcer of other part of right lower leg limited to  breakdown Modifier: f skin of skin Quantity: 1 Physician Procedures : CPT4 Code Description Modifier [id number] 99214 - WC PHYS LEVEL 4 - EST PT 25 ICD-10 Diagnosis Description 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f skin L97.811 Non-pressure chronic ulcer of other part of  right lower leg limited to breakdown of skin I87.2 Venous insufficiency (chronic) (peripheral) E11.622 Type 2  diabetes mellitus with other skin ulcer Quantity: 1 : [id number] 97597 - WC PHYS DEBR WO ANESTH 20 SQ CM ICD-10 Diagnosis Description 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f skin L97.811 Non-pressure chronic ulcer of other part of right lower leg limited to  breakdown of skin Quantity: 1 Electronic Signature(s) Signed: 04/15/2022 4:00:02 PM By: Adline Peals Signed: 04/15/2022 4:05:21 PM By: Fredirick Maudlin MD FACS Previous Signature: 04/15/2022 2:29:31 PM Version By: Fredirick Maudlin MD FACS Entered By: Adline Peals on 04/15/2022 14:44:58

## 2022-04-25 ENCOUNTER — Encounter (HOSPITAL_BASED_OUTPATIENT_CLINIC_OR_DEPARTMENT_OTHER): Payer: Medicare Other | Admitting: General Surgery

## 2022-04-25 DIAGNOSIS — I110 Hypertensive heart disease with heart failure: Secondary | ICD-10-CM | POA: Diagnosis not present

## 2022-04-25 DIAGNOSIS — E1140 Type 2 diabetes mellitus with diabetic neuropathy, unspecified: Secondary | ICD-10-CM | POA: Diagnosis not present

## 2022-04-25 DIAGNOSIS — I4819 Other persistent atrial fibrillation: Secondary | ICD-10-CM | POA: Diagnosis not present

## 2022-04-25 DIAGNOSIS — L97811 Non-pressure chronic ulcer of other part of right lower leg limited to breakdown of skin: Secondary | ICD-10-CM | POA: Diagnosis not present

## 2022-04-25 DIAGNOSIS — Z8673 Personal history of transient ischemic attack (TIA), and cerebral infarction without residual deficits: Secondary | ICD-10-CM | POA: Diagnosis not present

## 2022-04-25 DIAGNOSIS — Z950 Presence of cardiac pacemaker: Secondary | ICD-10-CM | POA: Diagnosis not present

## 2022-04-25 DIAGNOSIS — I509 Heart failure, unspecified: Secondary | ICD-10-CM | POA: Diagnosis not present

## 2022-04-25 DIAGNOSIS — L97821 Non-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 leg limited to breakdown of skin: Secondary | ICD-10-CM | POA: Diagnosis not present

## 2022-04-25 DIAGNOSIS — I2510 Atherosclerotic heart disease of native coronary artery without angina pectoris: Secondary | ICD-10-CM | POA: Diagnosis not present

## 2022-04-25 DIAGNOSIS — E11622 Type 2 diabetes mellitus with other skin ulcer: Secondary | ICD-10-CM | POA: Diagnosis not present

## 2022-04-25 DIAGNOSIS — I872 Venous insufficiency (chronic) (peripheral): Secondary | ICD-10-CM | POA: Diagnosis not present

## 2022-04-25 NOTE — Progress Notes (Signed)
Dorsi, Yuvaan M. ([id number]) Visit Report for 04/25/2022 Arrival Information Details Patient Name: Date of Service: [name] ID M. 04/25/2022 3:00 PM Medical Record Number: [id number] Patient Account Number: [id number] Date of Birth/Sex: Treating RN: [birth year] (86 y.o. Janyth Contes Primary Care Armonte Tortorella: [name] Other Clinician: Referring Callahan Wild: Treating Azyah Flett/Extender: Celine Ahr in Treatment: 1 Visit Information History Since Last Visit Added or deleted any medications: No Patient Arrived: Ambulatory Any new allergies or adverse reactions: No Arrival Time: 14:59 Had a fall or experienced change in No Accompanied By: self activities of daily living that may affect Transfer Assistance: None risk of falls: Patient Identification Verified: Yes Signs or symptoms of abuse/neglect since last visito No Secondary Verification Process Completed: Yes Hospitalized since last visit: No Patient Requires Transmission-Based Precautions: No Implantable device outside of the clinic excluding No Patient Has Alerts: Yes cellular tissue based products placed in the center Patient Alerts: Patient on Blood Thinner since last visit: Has Dressing in Place as Prescribed: Yes Has Compression in Place as Prescribed: Yes Pain Present Now: No Electronic Signature(s) Signed: 04/25/2022 5:02:03 PM By: Adline Peals Entered By: Adline Peals on 04/25/2022 15:01:03 -------------------------------------------------------------------------------- Encounter Discharge Information Details Patient Name: Date of Service: [name], [name] 04/25/2022 3:00 PM Medical Record Number: [id number] Patient Account Number: [id number] Date of Birth/Sex: Treating RN: April 18, 1936 (86 y.o. Janyth Contes Primary Care Kemya Shed: [name] Other Clinician: Referring Deborah Dondero: Treating Erika Slaby/Extender: Celine Ahr in Treatment:  1 Encounter Discharge Information Items Post Procedure Vitals Discharge Condition: Stable Temperature (F): 97.7 Ambulatory Status: Ambulatory Pulse (bpm): 55 Discharge Destination: Home Respiratory Rate (breaths/min): 18 Transportation: Private Auto Blood Pressure (mmHg): 140/48 Accompanied By: self Schedule Follow-up Appointment: Yes Clinical Summary of Care: Patient Declined Electronic Signature(s) Signed: 04/25/2022 5:02:03 PM By: Adline Peals Entered By: Adline Peals on 04/25/2022 15:40:32 -------------------------------------------------------------------------------- Lower Extremity Assessment Details Patient Name: Date of Service: [name], Shaune Pascal ID M. 04/25/2022 3:00 PM Medical Record Number: [id number] Patient Account Number: [id number] Date of Birth/Sex: Treating RN: [birth year] (86 y.o. Janyth Contes Primary Care Adib Wahba: [name] Other Clinician: Referring Darionna Banke: Treating Demetrie Borge/Extender: Corliss Marcus Weeks in Treatment: 1 Edema Assessment Assessed: [Left: No] [Right: No] E[Left: dema] [Right: :] Calf Left: Right: Point of Measurement: From Medial Instep 35.9 cm 37.5 cm Ankle Left: Right: Point of Measurement: From Medial Instep 23 cm 21 cm Vascular Assessment Pulses: Dorsalis Pedis Palpable: [Left:Yes] [Right:Yes] Electronic Signature(s) Signed: 04/25/2022 5:02:03 PM By: Adline Peals Entered By: Adline Peals on 04/25/2022 15:08:53 -------------------------------------------------------------------------------- Multi Wound Chart Details Patient Name: Date of Service: [name], [name] 04/25/2022 3:00 PM Medical Record Number: [id number] Patient Account Number: [id number] Date of Birth/Sex: Treating RN: [birth year] (86 y.o. Janyth Contes Primary Care Turner Kunzman: [name] Other Clinician: Referring Michaelyn Wall: Treating Inas Avena/Extender: Celine Ahr in  Treatment: 1 Vital Signs Height(in): 53 Capillary Blood Glucose(mg/dl): 143 Weight(lbs): 175 Pulse(bpm): 50 Body Mass Index(BMI): 25.8 Blood Pressure(mmHg): 140/48 Temperature(F): 97.7 Respiratory Rate(breaths/min): 18 Photos: Left, Anterior Lower Leg Right, Anterior Lower Leg Right, Medial Lower Leg Wound Location: Blister Blister Blister Wounding Event: Venous Leg Ulcer Venous Leg Ulcer Venous Leg Ulcer Primary Etiology: Cataracts, Glaucoma, Chronic Cataracts, Glaucoma, Chronic Cataracts, Glaucoma, Chronic Comorbid History: Obstructive Pulmonary Disease Obstructive Pulmonary Disease Obstructive Pulmonary Disease (COPD), Arrhythmia, Congestive (COPD), Arrhythmia, Congestive (COPD), Arrhythmia, Congestive Heart Failure, Coronary Artery Heart Failure, Coronary Artery Heart Failure, Coronary Artery Disease, Hypertension, Myocardial Disease, Hypertension,  Myocardial Disease, Hypertension, Myocardial Infarction, Type II Diabetes, Gout, Infarction, Type II Diabetes, Gout, Infarction, Type II Diabetes, Gout, Neuropathy Neuropathy Neuropathy 01/27/2022 01/27/2022 01/27/2022 Date Acquired: '1 1 1 '$ Weeks of Treatment: Open Healed - Epithelialized Open Wound Status: No No No Wound Recurrence: 1.5x0.9x0.1 0x0x0 0.7x0.6x0.1 Measurements L x W x D (cm) 1.06 0 0.33 A (cm) : rea 0.106 0 0.033 Volume (cm) : 43.80% 100.00% 65.00% % Reduction in A rea: 43.60% 100.00% 64.90% % Reduction in Volume: Full Thickness Without Exposed Full Thickness Without Exposed Full Thickness Without Exposed Classification: Support Structures Support Structures Support Structures Medium None Present Medium Exudate A mount: Serosanguineous N/A Serosanguineous Exudate Type: red, brown N/A red, brown Exudate Color: Distinct, outline attached Distinct, outline attached Distinct, outline attached Wound Margin: Large (67-100%) None Present (0%) Large (67-100%) Granulation A mount: Red N/A Red Granulation  Quality: Small (1-33%) None Present (0%) Small (1-33%) Necrotic A mount: Fat Layer (Subcutaneous Tissue): Yes Fascia: No Fat Layer (Subcutaneous Tissue): Yes Exposed Structures: Fascia: No Fat Layer (Subcutaneous Tissue): No Fascia: No Tendon: No Tendon: No Tendon: No Muscle: No Muscle: No Muscle: No Joint: No Joint: No Joint: No Bone: No Bone: No Bone: No Small (1-33%) Large (67-100%) Large (67-100%) Epithelialization: Debridement - Selective/Open Wound N/A Debridement - Selective/Open Wound Debridement: Pre-procedure Verification/Time Out 15:17 N/A 15:17 Taken: Lidocaine 4% Topical Solution N/A Lidocaine 4% Topical Solution Pain Control: Necrotic/Eschar, Slough N/A Necrotic/Eschar Tissue Debrided: Non-Viable Tissue N/A Non-Viable Tissue Level: 6 N/A 0.42 Debridement A (sq cm): rea Curette N/A Curette Instrument: Minimum N/A Minimum Bleeding: Pressure N/A Pressure Hemostasis A chieved: 0 N/A 0 Procedural Pain: 0 N/A 0 Post Procedural Pain: Procedure was tolerated well N/A Procedure was tolerated well Debridement Treatment Response: 1.5x0.9x0.1 N/A 0.7x0.6x0.1 Post Debridement Measurements L x W x D (cm) 0.106 N/A 0.033 Post Debridement Volume: (cm) Debridement N/A Debridement Procedures Performed: Treatment Notes Wound #11 (Lower Leg) Wound Laterality: Left, Anterior Cleanser Soap and Water Discharge Instruction: May shower and wash wound with dial antibacterial soap and water prior to dressing change. Wound Cleanser Discharge Instruction: Cleanse the wound with wound cleanser prior to applying a clean dressing using gauze sponges, not tissue or cotton balls. Peri-Wound Care Sween Lotion (Moisturizing lotion) Discharge Instruction: Apply moisturizing lotion as directed Topical Primary Dressing KerraCel Ag Gelling Fiber Dressing, 2x2 in (silver alginate) Discharge Instruction: Apply silver alginate to wound bed as instructed Secondary  Dressing Woven Gauze Sponge, Non-Sterile 4x4 in Discharge Instruction: Apply over primary dressing as directed. Secured With [employer] 4x5 (in/yd) Discharge Instruction: Secure with Coban as directed. Kerlix Roll Sterile, 4.5x3.1 (in/yd) Discharge Instruction: Secure with Kerlix as directed. 37M Medipore H Soft Cloth Surgical T ape, 4 x 10 (in/yd) Discharge Instruction: Secure with tape as directed. Compression Wrap Compression Stockings Add-Ons Wound #13 (Lower Leg) Wound Laterality: Right, Medial Cleanser Soap and Water Discharge Instruction: May shower and wash wound with dial antibacterial soap and water prior to dressing change. Wound Cleanser Discharge Instruction: Cleanse the wound with wound cleanser prior to applying a clean dressing using gauze sponges, not tissue or cotton balls. Peri-Wound Care Sween Lotion (Moisturizing lotion) Discharge Instruction: Apply moisturizing lotion as directed Topical Primary Dressing KerraCel Ag Gelling Fiber Dressing, 2x2 in (silver alginate) Discharge Instruction: Apply silver alginate to wound bed as instructed Secondary Dressing Woven Gauze Sponge, Non-Sterile 4x4 in Discharge Instruction: Apply over primary dressing as directed. Secured With [employer] 4x5 (in/yd) Discharge Instruction: Secure with Coban as directed. Kerlix Roll Sterile, 4.5x3.1 (  in/yd) Discharge Instruction: Secure with Kerlix as directed. 45M Medipore H Soft Cloth Surgical T ape, 4 x 10 (in/yd) Discharge Instruction: Secure with tape as directed. Compression Wrap Compression Stockings Add-Ons Electronic Signature(s) Signed: 04/25/2022 3:45:52 PM By: Fredirick Maudlin MD FACS Signed: 04/25/2022 5:02:03 PM By: Sabas Sous By: Fredirick Maudlin on 04/25/2022 15:45:52 -------------------------------------------------------------------------------- Multi-Disciplinary Care Plan Details Patient Name: Date of Service: DA  [name], [name] 04/25/2022 3:00 PM Medical Record Number: [id number] Patient Account Number: [id number] Date of Birth/Sex: Treating RN: Feb 23, 1936 (86 y.o. Janyth Contes Primary Care Emonni Depasquale: [name] Other Clinician: Referring Deisi Salonga: Treating Guerry Covington/Extender: Celine Ahr in Treatment: 1 Active Inactive Abuse / Safety / Falls / Self Care Management Nursing Diagnoses: Impaired physical mobility Potential for falls Goals: Patient will not experience any injury related to falls Date Initiated: 04/15/2022 Target Resolution Date: 06/03/2022 Goal Status: Active Patient/caregiver will verbalize/demonstrate measures taken to improve the patient's personal safety Date Initiated: 04/15/2022 Target Resolution Date: 06/03/2022 Goal Status: Active Interventions: Provide education on fall prevention Notes: Wound/Skin Impairment Nursing Diagnoses: Impaired tissue integrity Knowledge deficit related to ulceration/compromised skin integrity Goals: Patient/caregiver will verbalize understanding of skin care regimen Date Initiated: 04/15/2022 Target Resolution Date: 06/03/2022 Goal Status: Active Ulcer/skin breakdown will have a volume reduction of 30% by week 4 Date Initiated: 04/15/2022 Target Resolution Date: 06/03/2022 Goal Status: Active Interventions: Assess ulceration(s) every visit Treatment Activities: Skin care regimen initiated : 04/15/2022 Topical wound management initiated : 04/15/2022 Notes: Electronic Signature(s) Signed: 04/25/2022 5:02:03 PM By: Adline Peals Entered By: Adline Peals on 04/25/2022 15:14:14 -------------------------------------------------------------------------------- Pain Assessment Details Patient Name: Date of Service: [name], [name] 04/25/2022 3:00 PM Medical Record Number: [id number] Patient Account Number: [id number] Date of Birth/Sex: Treating RN: 12-15-1935 (86 y.o. Janyth Contes Primary Care Lyvonne Cassell: [name] Other Clinician: Referring Anaira Seay: Treating Xane Amsden/Extender: Celine Ahr in Treatment: 1 Active Problems Location of Pain Severity and Description of Pain Patient Has Paino No Site Locations Rate the pain. Rate the pain. Current Pain Level: 0 Pain Management and Medication Current Pain Management: Electronic Signature(s) Signed: 04/25/2022 5:02:03 PM By: Adline Peals Entered By: Adline Peals on 04/25/2022 15:01:49 -------------------------------------------------------------------------------- Patient/Caregiver Education Details Patient Name: Date of Service: DA Burgess Estelle 8/28/2023andnbsp3:00 PM Medical Record Number: [id number] Patient Account Number: [id number] Date of Birth/Gender: Treating RN: 1936/06/23 (86 y.o. Janyth Contes Primary Care Physician: [name] Other Clinician: Referring Physician: Treating Physician/Extender: Celine Ahr in Treatment: 1 Education Assessment Education Provided To: Patient Education Topics Provided Wound/Skin Impairment: Methods: Explain/Verbal Responses: Reinforcements needed, State content correctly Electronic Signature(s) Signed: 04/25/2022 5:02:03 PM By: Adline Peals Entered By: Adline Peals on 04/25/2022 15:14:31 -------------------------------------------------------------------------------- Wound Assessment Details Patient Name: Date of Service: [name], [name] 04/25/2022 3:00 PM Medical Record Number: [id number] Patient Account Number: [id number] Date of Birth/Sex: Treating RN: 1935/12/25 (86 y.o. Janyth Contes Primary Care Kimber Fritts: [name] Other Clinician: Referring Ama Mcmaster: Treating Dmarion Perfect/Extender: Corliss Marcus Weeks in Treatment: 1 Wound Status Wound Number: 11 Primary Venous Leg Ulcer Etiology: Wound Location: Left, Anterior Lower  Leg Wound Open Wounding Event: Blister Status: Date Acquired: 01/27/2022 Comorbid Cataracts, Glaucoma, Chronic Obstructive Pulmonary Disease Weeks Of Treatment: 1 History: (COPD), Arrhythmia, Congestive Heart Failure, Coronary Artery Clustered Wound: No Disease, Hypertension, Myocardial Infarction, Type II Diabetes, Gout, Neuropathy Photos Wound Measurements Length: (cm) 1.5 Width: (cm) 0.9 Depth: (cm) 0.1 Area: (cm) 1.06 Volume: (cm) 0.106 %  Reduction in Area: 43.8% % Reduction in Volume: 43.6% Epithelialization: Small (1-33%) Tunneling: No Undermining: No Wound Description Classification: Full Thickness Without Exposed Support Structures Wound Margin: Distinct, outline attached Exudate Amount: Medium Exudate Type: Serosanguineous Exudate Color: red, brown Foul Odor After Cleansing: No Slough/Fibrino Yes Wound Bed Granulation Amount: Large (67-100%) Exposed Structure Granulation Quality: Red Fascia Exposed: No Necrotic Amount: Small (1-33%) Fat Layer (Subcutaneous Tissue) Exposed: Yes Necrotic Quality: Adherent Slough Tendon Exposed: No Muscle Exposed: No Joint Exposed: No Bone Exposed: No Treatment Notes Wound #11 (Lower Leg) Wound Laterality: Left, Anterior Cleanser Soap and Water Discharge Instruction: May shower and wash wound with dial antibacterial soap and water prior to dressing change. Wound Cleanser Discharge Instruction: Cleanse the wound with wound cleanser prior to applying a clean dressing using gauze sponges, not tissue or cotton balls. Peri-Wound Care Sween Lotion (Moisturizing lotion) Discharge Instruction: Apply moisturizing lotion as directed Topical Primary Dressing KerraCel Ag Gelling Fiber Dressing, 2x2 in (silver alginate) Discharge Instruction: Apply silver alginate to wound bed as instructed Secondary Dressing Woven Gauze Sponge, Non-Sterile 4x4 in Discharge Instruction: Apply over primary dressing as directed. Secured With [employer] 4x5 (in/yd) Discharge Instruction: Secure with Coban as directed. Kerlix Roll Sterile, 4.5x3.1 (in/yd) Discharge Instruction: Secure with Kerlix as directed. 36M Medipore H Soft Cloth Surgical T ape, 4 x 10 (in/yd) Discharge Instruction: Secure with tape as directed. Compression Wrap Compression Stockings Add-Ons Electronic Signature(s) Signed: 04/25/2022 5:02:03 PM By: Adline Peals Entered By: Adline Peals on 04/25/2022 15:11:38 -------------------------------------------------------------------------------- Wound Assessment Details Patient Name: Date of Service: [name], [name] 04/25/2022 3:00 PM Medical Record Number: [id number] Patient Account Number: [id number] Date of Birth/Sex: Treating RN: 1936-06-15 (86 y.o. Janyth Contes Primary Care Estevan Kersh: [name] Other Clinician: Referring Kenadi Miltner: Treating Quoc Tome/Extender: Corliss Marcus Weeks in Treatment: 1 Wound Status Wound Number: 12 Primary Venous Leg Ulcer Etiology: Wound Location: Right, Anterior Lower Leg Wound Healed - Epithelialized Wounding Event: Blister Status: Date Acquired: 01/27/2022 Comorbid Cataracts, Glaucoma, Chronic Obstructive Pulmonary Disease Weeks Of Treatment: 1 History: (COPD), Arrhythmia, Congestive Heart Failure, Coronary Artery Clustered Wound: No Disease, Hypertension, Myocardial Infarction, Type II Diabetes, Gout, Neuropathy Photos Wound Measurements Length: (cm) Width: (cm) Depth: (cm) Area: (cm) Volume: (cm) 0 % Reduction in Area: 100% 0 % Reduction in Volume: 100% 0 Epithelialization: Large (67-100%) 0 Tunneling: No 0 Undermining: No Wound Description Classification: Full Thickness Without Exposed Support Structures Wound Margin: Distinct, outline attached Exudate Amount: None Present Foul Odor After Cleansing: No Slough/Fibrino No Wound Bed Granulation Amount: None Present (0%) Exposed Structure Necrotic  Amount: None Present (0%) Fascia Exposed: No Fat Layer (Subcutaneous Tissue) Exposed: No Tendon Exposed: No Muscle Exposed: No Joint Exposed: No Bone Exposed: No Electronic Signature(s) Signed: 04/25/2022 5:02:03 PM By: Adline Peals Entered By: Adline Peals on 04/25/2022 15:19:44 -------------------------------------------------------------------------------- Wound Assessment Details Patient Name: Date of Service: [name], [name] 04/25/2022 3:00 PM Medical Record Number: [id number] Patient Account Number: [id number] Date of Birth/Sex: Treating RN: 01-31-1936 (86 y.o. Janyth Contes Primary Care Demitria Hay: [name] Other Clinician: Referring Arval Brandstetter: Treating Markeya Mincy/Extender: Corliss Marcus Weeks in Treatment: 1 Wound Status Wound Number: 13 Primary Venous Leg Ulcer Etiology: Wound Location: Right, Medial Lower Leg Wound Open Wounding Event: Blister Status: Date Acquired: 01/27/2022 Comorbid Cataracts, Glaucoma, Chronic Obstructive Pulmonary Disease Weeks Of Treatment: 1 History: (COPD), Arrhythmia, Congestive Heart Failure, Coronary Artery Clustered Wound: No Disease, Hypertension, Myocardial Infarction, Type II Diabetes, Gout, Neuropathy Photos Wound  Measurements Length: (cm) 0.7 Width: (cm) 0.6 Depth: (cm) 0.1 Area: (cm) 0.33 Volume: (cm) 0.033 % Reduction in Area: 65% % Reduction in Volume: 64.9% Epithelialization: Large (67-100%) Tunneling: No Undermining: No Wound Description Classification: Full Thickness Without Exposed Support Structures Wound Margin: Distinct, outline attached Exudate Amount: Medium Exudate Type: Serosanguineous Exudate Color: red, brown Foul Odor After Cleansing: No Slough/Fibrino Yes Wound Bed Granulation Amount: Large (67-100%) Exposed Structure Granulation Quality: Red Fascia Exposed: No Necrotic Amount: Small (1-33%) Fat Layer (Subcutaneous Tissue) Exposed: Yes Necrotic Quality:  Adherent Slough Tendon Exposed: No Muscle Exposed: No Joint Exposed: No Bone Exposed: No Treatment Notes Wound #13 (Lower Leg) Wound Laterality: Right, Medial Cleanser Soap and Water Discharge Instruction: May shower and wash wound with dial antibacterial soap and water prior to dressing change. Wound Cleanser Discharge Instruction: Cleanse the wound with wound cleanser prior to applying a clean dressing using gauze sponges, not tissue or cotton balls. Peri-Wound Care Sween Lotion (Moisturizing lotion) Discharge Instruction: Apply moisturizing lotion as directed Topical Primary Dressing KerraCel Ag Gelling Fiber Dressing, 2x2 in (silver alginate) Discharge Instruction: Apply silver alginate to wound bed as instructed Secondary Dressing Woven Gauze Sponge, Non-Sterile 4x4 in Discharge Instruction: Apply over primary dressing as directed. Secured With [employer] 4x5 (in/yd) Discharge Instruction: Secure with Coban as directed. Kerlix Roll Sterile, 4.5x3.1 (in/yd) Discharge Instruction: Secure with Kerlix as directed. 74M Medipore H Soft Cloth Surgical T ape, 4 x 10 (in/yd) Discharge Instruction: Secure with tape as directed. Compression Wrap Compression Stockings Add-Ons Electronic Signature(s) Signed: 04/25/2022 5:02:03 PM By: Adline Peals Entered By: Adline Peals on 04/25/2022 15:12:22 -------------------------------------------------------------------------------- Vitals Details Patient Name: Date of Service: [name], [name] 04/25/2022 3:00 PM Medical Record Number: [id number] Patient Account Number: [id number] Date of Birth/Sex: Treating RN: 01/23/1936 (86 y.o. Janyth Contes Primary Care Celester Morgan: [name] Other Clinician: Referring Capri Raben: Treating Bela Bonaparte/Extender: Celine Ahr in Treatment: 1 Vital Signs Time Taken: 15:01 Temperature (F): 97.7 Height (in): 69 Pulse (bpm): 55 Weight (lbs):  175 Respiratory Rate (breaths/min): 18 Body Mass Index (BMI): 25.8 Blood Pressure (mmHg): 140/48 Capillary Blood Glucose (mg/dl): 143 Reference Range: 80 - 120 mg / dl Electronic Signature(s) Signed: 04/25/2022 5:02:03 PM By: Adline Peals Entered By: Adline Peals on 04/25/2022 15:01:43

## 2022-04-25 NOTE — Progress Notes (Signed)
Vincent [name], Augustino M. ([id number]) Visit Report for 04/25/2022 Chief Complaint Document Details Patient Name: Date of Service: Vincent [name] ID M. 04/25/2022 3:00 PM Medical Record Number: [id number] Patient Account Number: [id number] Date of Birth/Sex: Treating RN: 1936/04/21 (86 y.o. Janyth Contes Primary Care Provider: [name] Other Clinician: Referring Provider: Treating Provider/Extender: Celine Ahr in Treatment: 1 Information Obtained from: Patient Chief Complaint 07/15/2021 patient is here for wounds on his bilateral lower legs 04/15/2022: The patient returns to clinic with new bilateral leg wounds. Electronic Signature(s) Signed: 04/25/2022 3:45:58 PM By: Fredirick Maudlin MD FACS Entered By: Fredirick Maudlin on 04/25/2022 15:45:58 -------------------------------------------------------------------------------- Debridement Details Patient Name: Date of Service: Vincent Vincent [name], Vincent [name] ID M. 04/25/2022 3:00 PM Medical Record Number: [id number] Patient Account Number: [id number] Date of Birth/Sex: Treating RN: Feb 09, 1936 (86 y.o. Janyth Contes Primary Care Provider: [name] Other Clinician: Referring Provider: Treating Provider/Extender: Celine Ahr in Treatment: 1 Debridement Performed for Assessment: Wound #11 Left,Anterior Lower Leg Performed By: Physician Fredirick Maudlin, MD Debridement Type: Debridement Severity of Tissue Pre Debridement: Fat layer exposed Level of Consciousness (Pre-procedure): Awake and Alert Pre-procedure Verification/Time Out Yes - 15:17 Taken: Start Time: 15:17 Pain Control: Lidocaine 4% Topical Solution T Area Debrided (L x W): otal 3 (cm) x 2 (cm) = 6 (cm) Tissue and other material debrided: Eschar, Slough, Biofilm, Slough Level: Non-Viable Tissue Debridement Description: Selective/Open Wound Instrument: Curette Bleeding: Minimum Hemostasis Achieved: Pressure Procedural Pain:  0 Post Procedural Pain: 0 Response to Treatment: Procedure was tolerated well Level of Consciousness (Post- Awake and Alert procedure): Post Debridement Measurements of Total Wound Length: (cm) 1.5 Width: (cm) 0.9 Depth: (cm) 0.1 Volume: (cm) 0.106 Character of Wound/Ulcer Post Debridement: Improved Severity of Tissue Post Debridement: Fat layer exposed Post Procedure Diagnosis Same as Pre-procedure Electronic Signature(s) Signed: 04/25/2022 3:44:00 PM By: Fredirick Maudlin MD FACS Signed: 04/25/2022 5:02:03 PM By: Adline Peals Entered By: Adline Peals on 04/25/2022 15:18:45 -------------------------------------------------------------------------------- Debridement Details Patient Name: Date of Service: Vincent [name], Vincent [name] ID M. 04/25/2022 3:00 PM Medical Record Number: [id number] Patient Account Number: [id number] Date of Birth/Sex: Treating RN: 1935/12/08 (86 y.o. Janyth Contes Primary Care Provider: [name] Other Clinician: Referring Provider: Treating Provider/Extender: Celine Ahr in Treatment: 1 Debridement Performed for Assessment: Wound #13 Right,Medial Lower Leg Performed By: Physician Fredirick Maudlin, MD Debridement Type: Debridement Severity of Tissue Pre Debridement: Fat layer exposed Level of Consciousness (Pre-procedure): Awake and Alert Pre-procedure Verification/Time Out Yes - 15:17 Taken: Start Time: 15:17 Pain Control: Lidocaine 4% T opical Solution T Area Debrided (L x W): otal 0.7 (cm) x 0.6 (cm) = 0.42 (cm) Tissue and other material debrided: Non-Viable, Eschar Level: Non-Viable Tissue Debridement Description: Selective/Open Wound Instrument: Curette Bleeding: Minimum Hemostasis Achieved: Pressure Procedural Pain: 0 Post Procedural Pain: 0 Response to Treatment: Procedure was tolerated well Level of Consciousness (Post- Awake and Alert procedure): Post Debridement Measurements of Total  Wound Length: (cm) 0.7 Width: (cm) 0.6 Depth: (cm) 0.1 Volume: (cm) 0.033 Character of Wound/Ulcer Post Debridement: Improved Severity of Tissue Post Debridement: Fat layer exposed Post Procedure Diagnosis Same as Pre-procedure Electronic Signature(s) Signed: 04/25/2022 3:44:00 PM By: Fredirick Maudlin MD FACS Signed: 04/25/2022 5:02:03 PM By: Adline Peals Entered By: Adline Peals on 04/25/2022 15:19:19 -------------------------------------------------------------------------------- HPI Details Patient Name: Date of Service: Vincent [name], Vincent [name] ID M. 04/25/2022 3:00 PM Medical Record Number: [id number] Patient Account Number: [id number] Date of Birth/Sex: Treating RN: 1935-12-19 (86 y.o. Flossie Buffy,  Lovena Le Primary Care Provider: [name] Other Clinician: Referring Provider: Treating Provider/Extender: Celine Ahr in Treatment: 1 History of Present Illness HPI Description: ADMISSION 07/15/2021 This is an 86 year old man who came here for evaluation of wounds on his bilateral predominantly anterior lower legs mid aspect of the tibia. He seems to have been dealing with this for most of this year. Currently with 3 wounds on the right and 2 on the left. At least some of these seem to start off as blisters probably the majority. He has been following with Dr. Anabel Bene of [facility] dermatology according the patient back he has an appointment Dr. Anabel Bene tomorrow he was given a prescription for triamcinolone which she has been applying daily in fact it was renewed yesterday. Patient is a diabetic no recent hemoglobin A1c. He is on Coumadin for persistent atrial fibrillation. Vein and vascular in December 2021 at which time he had bilateral foot pain and finger pain. ABIs bilaterally were noncompressible however the TBI on the right was only 0.39. There were biphasic waveforms on the left his ABI was noncompressible with biphasic waveforms but with a TBI  of 0.6. He was not felt to have a primary vascular issue. There is a mention of Raynaud's phenomenon as well. Past medical history includes cellulitis of the right leg in June 2022, type 2 diabetes, persistent lower extremity edema, bilateral foot and ankle pain, Raynaud's phenomenon, coronary artery disease, congestive heart failure, pacemaker, none sustained ventricular tachycardia, remote CVA paroxysmal atrial fib on Coumadin. We did not reattempt ABIs in our clinic 07/22/2019 patient appears to be doing decently well in regard to his wounds as compared to last week with the wounds that were present last week. Fortunately I do not see any signs of active infection which is great news. No fevers, chills, nausea, vomiting, or diarrhea. With that being said he has several new blisters that are getting need to be addressed today. 07/28/2021 upon evaluation today patient actually appears to be making excellent progress. I am extremely pleased with where we stand today. I think that his wounds are significantly improved compared to where they were previous. 08/04/2021 upon evaluation today patient appears to be doing much better in regard to his wounds in general. I feel like across the board he is showing signs of significant improvement. Fortunately there is no evidence of infection which is great news. No fevers, chills, nausea, vomiting, or diarrhea. 08/11/2021 upon evaluation today patient actually appears to be doing quite well in regard to his wound. Has been tolerating the dressing changes without complication. Fortunately I do not see any signs of infection currently which is great news as well. No fevers, chills, nausea, vomiting, or diarrhea. 08/18/2021 on evaluation today patient appears to be doing excellent he is making good progress and overall I am extremely pleased with where we stand today. There does not appear to be any signs of active infection at this time which is great  news. 09/01/2020 upon evaluation patient's wounds on both the left and the right appear to be at 0.1 cm measurement and are very close to complete resolution. Were not quite completely done yet but this is very close. Overall I am extremely happy with where things stand and I think he is headed in the right direction. 09/08/2021 upon evaluation today patient appears to be doing well in regard to his right leg which is showing signs of being completely healed which is excellent. Fortunately I do not see any evidence  of active infection locally nor systemically at this point which is also great news. No fevers, chills, nausea, vomiting, or diarrhea. 09/15/2021; this is a patient we have been following for now a small wound on the left dorsal foot. He had wounds on the right legs and was discharged to 20/30 below-knee stockings apparently in late December. He came in today with marked increase in swelling in the right leg large areas of denuded skin which probably started as blisters. He did not have a stocking on 10/06/2021 upon evaluation today patient appears to be doing decently well in regard to his wounds. On the left leg he is completely healed on the right leg he does still have a small area that is open although again its not as good as it was last time I saw him is also not as bad as it was in the beginning. He has been in the hospital and they did not wrap him as part of the issue I believe here as well. 10/20/2021 upon evaluation today patient appears to be doing well with regard to his leg ulcer. He has been tolerating the dressing changes without complication. Fortunately I do not see any signs of active infection locally or systemically at this point. No fevers, chills, nausea, vomiting, or diarrhea. 10/27/2021 upon evaluation today patient appears to be doing well with regard to his wound. In fact this appears to be almost completely healed which is great news. Fortunately I do not see any  evidence of active infection locally nor systemically which is great news. No fevers, chills, nausea, vomiting, or diarrhea. 11/03/2021 upon evaluation today patient is very close to complete resolution he just has a very tiny area on the right leg still open at this point. He did go get socks from [facility] as I discussed with him at the last visit unfortunately he actually got diabetic socks and compression socks. I explained those are not the same thing and he voiced understanding he is going to go to a different [facility] and try to find exactly what we are looking for. Fortunately I do not see any signs of infection and I think he is very close to discharge will probably be ready for next week. 11/10/2021: T oday the patient's right lower extremity wound is closed. He was able to get the low grade compression socks from [employer] and is wearing 1 on the left leg. READMISSION 04/15/2022 The patient returns to clinic today with new bilateral lower extremity leg wounds. He says that he has not been using moisturizer regularly and is not wearing his compression stockings. The wounds are superficial. He has eschar buildup on the surfaces and thickened dry, cracked skin. Under the eschar, there is a little bit of slough on the wound surfaces. 04/25/2022: All of the wounds are little bit smaller with just a little eschar on their surfaces. The right anterior tibial wound is closed. Electronic Signature(s) Signed: 04/25/2022 3:46:26 PM By: Fredirick Maudlin MD FACS Entered By: Fredirick Maudlin on 04/25/2022 15:46:26 -------------------------------------------------------------------------------- Physical Exam Details Patient Name: Date of Service: Vincent Vincent [name], Vincent [name] ID M. 04/25/2022 3:00 PM Medical Record Number: [id number] Patient Account Number: [id number] Date of Birth/Sex: Treating RN: Dec 07, 1935 (86 y.o. Janyth Contes Primary Care Provider: [name] Other Clinician: Referring Provider: Treating  Provider/Extender: Corliss Marcus Weeks in Treatment: 1 Constitutional . Slightly bradycardic, asymptomatic.. . . No acute distress.Marland Kitchen Respiratory Normal work of breathing on room air.. Notes 04/25/2022: All of the wounds are little  bit smaller with just a little eschar on their surfaces. The right anterior tibial wound is closed. Electronic Signature(s) Signed: 04/25/2022 3:46:58 PM By: Fredirick Maudlin MD FACS Entered By: Fredirick Maudlin on 04/25/2022 15:46:58 -------------------------------------------------------------------------------- Physician Orders Details Patient Name: Date of Service: Vincent Vincent [name], Vincent [name] ID M. 04/25/2022 3:00 PM Medical Record Number: [id number] Patient Account Number: [id number] Date of Birth/Sex: Treating RN: 03/10/1936 (86 y.o. Janyth Contes Primary Care Provider: [name] Other Clinician: Referring Provider: Treating Provider/Extender: Celine Ahr in Treatment: 1 Verbal / Phone Orders: No Diagnosis Coding ICD-10 Coding Code Description [phone] Non-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 leg limited to breakdown of skin L97.811 Non-pressure chronic ulcer of other part of right lower leg limited to breakdown of skin I87.2 Venous insufficiency (chronic) (peripheral) I50.9 Heart failure, unspecified E11.622 Type 2 diabetes mellitus with other skin ulcer Follow-up Appointments ppointment in 1 week. - Dr. Celine Ahr - room 2 - 9/6 at 3:45 PM Return A Bathing/ Shower/ Hygiene May shower with protection but do not get wound dressing(s) wet. - may purchase a cast protector from [facility] or [facility] Edema Control - Lymphedema / SCD / Other Bilateral Lower Extremities Elevate legs to the level of the heart or above for 30 minutes daily and/or when sitting, a frequency of: - whenever sitting Avoid standing for long periods of time. Patient to wear own compression stockings every day. Exercise  regularly Additional Orders / Instructions Follow Nutritious Diet - Monitor blood sugars Wound Treatment Wound #11 - Lower Leg Wound Laterality: Left, Anterior Cleanser: Soap and Water 1 x Per Week/30 Days Discharge Instructions: May shower and wash wound with dial antibacterial soap and water prior to dressing change. Cleanser: Wound Cleanser 1 x Per Week/30 Days Discharge Instructions: Cleanse the wound with wound cleanser prior to applying a clean dressing using gauze sponges, not tissue or cotton balls. Peri-Wound Care: Sween Lotion (Moisturizing lotion) 1 x Per Week/30 Days Discharge Instructions: Apply moisturizing lotion as directed Prim Dressing: KerraCel Ag Gelling Fiber Dressing, 2x2 in (silver alginate) 1 x Per Week/30 Days ary Discharge Instructions: Apply silver alginate to wound bed as instructed Secondary Dressing: Woven Gauze Sponge, Non-Sterile 4x4 in 1 x Per Week/30 Days Discharge Instructions: Apply over primary dressing as directed. Secured With: Coban Self-Adherent Wrap 4x5 (in/yd) 1 x Per Week/30 Days Discharge Instructions: Secure with Coban as directed. Secured With: [employer], 4.5x3.1 (in/yd) 1 x Per Week/30 Days Discharge Instructions: Secure with Kerlix as directed. Secured With: 64M Medipore H Soft Cloth Surgical T ape, 4 x 10 (in/yd) 1 x Per Week/30 Days Discharge Instructions: Secure with tape as directed. Wound #13 - Lower Leg Wound Laterality: Right, Medial Cleanser: Soap and Water 1 x Per Week/30 Days Discharge Instructions: May shower and wash wound with dial antibacterial soap and water prior to dressing change. Cleanser: Wound Cleanser 1 x Per Week/30 Days Discharge Instructions: Cleanse the wound with wound cleanser prior to applying a clean dressing using gauze sponges, not tissue or cotton balls. Peri-Wound Care: Sween Lotion (Moisturizing lotion) 1 x Per Week/30 Days Discharge Instructions: Apply moisturizing lotion as directed Prim  Dressing: KerraCel Ag Gelling Fiber Dressing, 2x2 in (silver alginate) 1 x Per Week/30 Days ary Discharge Instructions: Apply silver alginate to wound bed as instructed Secondary Dressing: Woven Gauze Sponge, Non-Sterile 4x4 in 1 x Per Week/30 Days Discharge Instructions: Apply over primary dressing as directed. Secured With: Coban Self-Adherent Wrap 4x5 (in/yd) 1 x Per Week/30 Days Discharge  Instructions: Secure with Coban as directed. Secured With: [employer], 4.5x3.1 (in/yd) 1 x Per Week/30 Days Discharge Instructions: Secure with Kerlix as directed. Secured With: 89M Medipore H Soft Cloth Surgical T ape, 4 x 10 (in/yd) 1 x Per Week/30 Days Discharge Instructions: Secure with tape as directed. Electronic Signature(s) Signed: 04/25/2022 4:56:31 PM By: Fredirick Maudlin MD FACS Previous Signature: 04/25/2022 3:44:00 PM Version By: Fredirick Maudlin MD FACS Entered By: Fredirick Maudlin on 04/25/2022 15:47:16 -------------------------------------------------------------------------------- Problem List Details Patient Name: Date of Service: Vincent Vincent [name], Vincent [name] ID M. 04/25/2022 3:00 PM Medical Record Number: [id number] Patient Account Number: [id number] Date of Birth/Sex: Treating RN: 1936-08-05 (86 y.o. Janyth Contes Primary Care Provider: [name] Other Clinician: Referring Provider: Treating Provider/Extender: Celine Ahr in Treatment: 1 Active Problems ICD-10 Encounter Code Description Active Date MDM Diagnosis [phone] Non-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 leg limited to breakdown 04/15/2022 No Yes of skin L97.811 Non-pressure chronic ulcer of other part of right lower leg limited to breakdown 04/15/2022 No Yes of skin I87.2 Venous insufficiency (chronic) (peripheral) 04/15/2022 No Yes I50.9 Heart failure, unspecified 04/15/2022 No Yes E11.622 Type 2 diabetes mellitus with other skin ulcer 04/15/2022 No Yes Inactive  Problems Resolved Problems Electronic Signature(s) Signed: 04/25/2022 3:45:42 PM By: Fredirick Maudlin MD FACS Entered By: Fredirick Maudlin on 04/25/2022 15:45:42 -------------------------------------------------------------------------------- Progress Note Details Patient Name: Date of Service: Vincent [name], Vincent [name] ID M. 04/25/2022 3:00 PM Medical Record Number: [id number] Patient Account Number: [id number] Date of Birth/Sex: Treating RN: 04/23/1936 (86 y.o. Janyth Contes Primary Care Provider: [name] Other Clinician: Referring Provider: Treating Provider/Extender: Celine Ahr in Treatment: 1 Subjective Chief Complaint Information obtained from Patient 07/15/2021 patient is here for wounds on his bilateral lower legs 04/15/2022: The patient returns to clinic with new bilateral leg wounds. History of Present Illness (HPI) ADMISSION 07/15/2021 This is an 86 year old man who came here for evaluation of wounds on his bilateral predominantly anterior lower legs mid aspect of the tibia. He seems to have been dealing with this for most of this year. Currently with 3 wounds on the right and 2 on the left. At least some of these seem to start off as blisters probably the majority. He has been following with Dr. Anabel Bene of [facility] dermatology according the patient back he has an appointment Dr. Anabel Bene tomorrow he was given a prescription for triamcinolone which she has been applying daily in fact it was renewed yesterday. Patient is a diabetic no recent hemoglobin A1c. He is on Coumadin for persistent atrial fibrillation. Vein and vascular in December 2021 at which time he had bilateral foot pain and finger pain. ABIs bilaterally were noncompressible however the TBI on the right was only 0.39. There were biphasic waveforms on the left his ABI was noncompressible with biphasic waveforms but with a TBI of 0.6. He was not felt to have a primary vascular issue.  There is a mention of Raynaud's phenomenon as well. Past medical history includes cellulitis of the right leg in June 2022, type 2 diabetes, persistent lower extremity edema, bilateral foot and ankle pain, Raynaud's phenomenon, coronary artery disease, congestive heart failure, pacemaker, none sustained ventricular tachycardia, remote CVA paroxysmal atrial fib on Coumadin. We did not reattempt ABIs in our clinic 07/22/2019 patient appears to be doing decently well in regard to his wounds as compared to last week with the wounds that were present last week. Fortunately I do not see any signs  of active infection which is great news. No fevers, chills, nausea, vomiting, or diarrhea. With that being said he has several new blisters that are getting need to be addressed today. 07/28/2021 upon evaluation today patient actually appears to be making excellent progress. I am extremely pleased with where we stand today. I think that his wounds are significantly improved compared to where they were previous. 08/04/2021 upon evaluation today patient appears to be doing much better in regard to his wounds in general. I feel like across the board he is showing signs of significant improvement. Fortunately there is no evidence of infection which is great news. No fevers, chills, nausea, vomiting, or diarrhea. 08/11/2021 upon evaluation today patient actually appears to be doing quite well in regard to his wound. Has been tolerating the dressing changes without complication. Fortunately I do not see any signs of infection currently which is great news as well. No fevers, chills, nausea, vomiting, or diarrhea. 08/18/2021 on evaluation today patient appears to be doing excellent he is making good progress and overall I am extremely pleased with where we stand today. There does not appear to be any signs of active infection at this time which is great news. 09/01/2020 upon evaluation patient's wounds on both the left and  the right appear to be at 0.1 cm measurement and are very close to complete resolution. Were not quite completely done yet but this is very close. Overall I am extremely happy with where things stand and I think he is headed in the right direction. 09/08/2021 upon evaluation today patient appears to be doing well in regard to his right leg which is showing signs of being completely healed which is excellent. Fortunately I do not see any evidence of active infection locally nor systemically at this point which is also great news. No fevers, chills, nausea, vomiting, or diarrhea. 09/15/2021; this is a patient we have been following for now a small wound on the left dorsal foot. He had wounds on the right legs and was discharged to 20/30 below-knee stockings apparently in late December. He came in today with marked increase in swelling in the right leg large areas of denuded skin which probably started as blisters. He did not have a stocking on 10/06/2021 upon evaluation today patient appears to be doing decently well in regard to his wounds. On the left leg he is completely healed on the right leg he does still have a small area that is open although again its not as good as it was last time I saw him is also not as bad as it was in the beginning. He has been in the hospital and they did not wrap him as part of the issue I believe here as well. 10/20/2021 upon evaluation today patient appears to be doing well with regard to his leg ulcer. He has been tolerating the dressing changes without complication. Fortunately I do not see any signs of active infection locally or systemically at this point. No fevers, chills, nausea, vomiting, or diarrhea. 10/27/2021 upon evaluation today patient appears to be doing well with regard to his wound. In fact this appears to be almost completely healed which is great news. Fortunately I do not see any evidence of active infection locally nor systemically which is great news. No  fevers, chills, nausea, vomiting, or diarrhea. 11/03/2021 upon evaluation today patient is very close to complete resolution he just has a very tiny area on the right leg still open at this point. He  did go get socks from [employer] as I discussed with him at the last visit unfortunately he actually got diabetic socks and compression socks. I explained those are not the same thing and he voiced understanding he is going to go to a different [facility] and try to find exactly what we are looking for. Fortunately I do not see any signs of infection and I think he is very close to discharge will probably be ready for next week. 11/10/2021: T oday the patient's right lower extremity wound is closed. He was able to get the low grade compression socks from [employer] and is wearing 1 on the left leg. READMISSION 04/15/2022 The patient returns to clinic today with new bilateral lower extremity leg wounds. He says that he has not been using moisturizer regularly and is not wearing his compression stockings. The wounds are superficial. He has eschar buildup on the surfaces and thickened dry, cracked skin. Under the eschar, there is a little bit of slough on the wound surfaces. 04/25/2022: All of the wounds are little bit smaller with just a little eschar on their surfaces. The right anterior tibial wound is closed. Patient History Information obtained from Patient, Chart. Family History Cancer - Mother,Siblings, Heart Disease - Father, Hypertension - Mother,Father, Thyroid Problems - Siblings, No family history of Diabetes, Hereditary Spherocytosis, Kidney Disease, Lung Disease, Seizures, Stroke, Tuberculosis. Social History Former smoker, Marital Status - Divorced, Alcohol Use - Never, Drug Use - No History, Caffeine Use - Rarely - coffee. Medical History Eyes Patient has history of Cataracts - left eye, Glaucoma Denies history of Optic Neuritis Respiratory Patient has history of Chronic Obstructive Pulmonary  Disease (COPD) Cardiovascular Patient has history of Arrhythmia - afib, Congestive Heart Failure, Coronary Artery Disease, Hypertension, Myocardial Infarction Endocrine Patient has history of Type II Diabetes Denies history of Type I Diabetes Genitourinary Denies history of End Stage Renal Disease Integumentary (Skin) Denies history of History of Burn Musculoskeletal Patient has history of Gout Neurologic Patient has history of Neuropathy Oncologic Denies history of Received Chemotherapy, Received Radiation Psychiatric Denies history of Anorexia/bulimia, Confinement Anxiety Hospitalization/Surgery History - cardiac cath with stents. - colonoscopy. - knee surgery. Medical A Surgical History Notes nd Respiratory pulmonary eosinophilia Cardiovascular hyperlipidemia, alcoholic cardiomyopathy Gastrointestinal GERD Genitourinary incontinence, enlarged prostate Neurologic left foot drop, stroke Objective Constitutional Slightly bradycardic, asymptomatic.Marland Kitchen No acute distress.. Vitals Time Taken: 3:01 PM, Height: 69 in, Weight: 175 lbs, BMI: 25.8, Temperature: 97.7 F, Pulse: 55 bpm, Respiratory Rate: 18 breaths/min, Blood Pressure: 140/48 mmHg, Capillary Blood Glucose: 143 mg/dl. Respiratory Normal work of breathing on room air.. General Notes: 04/25/2022: All of the wounds are little bit smaller with just a little eschar on their surfaces. The right anterior tibial wound is closed. Integumentary (Hair, Skin) Wound #11 status is Open. Original cause of wound was Blister. The date acquired was: 01/27/2022. The wound has been in treatment 1 weeks. The wound is located on the Left,Anterior Lower Leg. The wound measures 1.5cm length x 0.9cm width x 0.1cm depth; 1.06cm^2 area and 0.106cm^3 volume. There is Fat Layer (Subcutaneous Tissue) exposed. There is no tunneling or undermining noted. There is a medium amount of serosanguineous drainage noted. The wound margin is distinct with the  outline attached to the wound base. There is large (67-100%) red granulation within the wound bed. There is a small (1-33%) amount of necrotic tissue within the wound bed including Adherent Slough. Wound #12 status is Healed - Epithelialized. Original cause of wound was Blister. The date acquired  was: 01/27/2022. The wound has been in treatment 1 weeks. The wound is located on the Right,Anterior Lower Leg. The wound measures 0cm length x 0cm width x 0cm depth; 0cm^2 area and 0cm^3 volume. There is no tunneling or undermining noted. There is a none present amount of drainage noted. The wound margin is distinct with the outline attached to the wound base. There is no granulation within the wound bed. There is no necrotic tissue within the wound bed. Wound #13 status is Open. Original cause of wound was Blister. The date acquired was: 01/27/2022. The wound has been in treatment 1 weeks. The wound is located on the Right,Medial Lower Leg. The wound measures 0.7cm length x 0.6cm width x 0.1cm depth; 0.33cm^2 area and 0.033cm^3 volume. There is Fat Layer (Subcutaneous Tissue) exposed. There is no tunneling or undermining noted. There is a medium amount of serosanguineous drainage noted. The wound margin is distinct with the outline attached to the wound base. There is large (67-100%) red granulation within the wound bed. There is a small (1-33%) amount of necrotic tissue within the wound bed including Adherent Slough. 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 There was a Selective/Open Wound Non-Viable Tissue Debridement with a total area of 6 sq cm performed by Fredirick Maudlin, MD. With the following instrument(s): Curette Material removed includes Eschar, [place], and Biofilm after achieving pain control using Lidocaine 4% Topical Solution. No specimens were taken. A time out was conducted at 15:17, prior to the start of the procedure. A Minimum amount of bleeding was controlled with Pressure. The procedure was tolerated well with a pain level of 0 throughout and a pain level of 0 following the procedure. Post Debridement Measurements: 1.5cm length x 0.9cm width x 0.1cm depth; 0.106cm^3 volume. Character of Wound/Ulcer Post Debridement is improved. Severity of Tissue Post Debridement is: Fat layer exposed. Post procedure Diagnosis Wound #11: Same as Pre-Procedure Wound #13 Pre-procedure diagnosis of Wound #13 is a Venous Leg Ulcer located on the Right,Medial Lower Leg .Severity of Tissue Pre Debridement is: Fat layer exposed. There was a Selective/Open Wound Non-Viable Tissue Debridement with a total area of 0.42 sq cm performed by Fredirick Maudlin, MD. With the following instrument(s): Curette to remove Non-Viable tissue/material. Material removed includes Eschar after achieving pain control using Lidocaine 4% Topical Solution. No specimens were taken. A time out was conducted at 15:17, prior to the start of the procedure. A Minimum amount of bleeding was controlled with Pressure. The procedure was tolerated well with a pain level of 0 throughout and a pain level of 0 following the procedure. Post Debridement Measurements: 0.7cm length x 0.6cm width x 0.1cm depth; 0.033cm^3 volume. Character of Wound/Ulcer Post Debridement is improved. Severity of Tissue Post Debridement is: Fat layer exposed. Post procedure Diagnosis Wound #13: Same as Pre-Procedure Plan Follow-up Appointments: Return Appointment in 1 week. - Dr. Celine Ahr - room 2 -  9/6 at 3:45 PM Bathing/ Shower/ Hygiene: May shower with protection but do not get wound dressing(s) wet. - may purchase a cast protector from [facility] or [facility] Edema Control - Lymphedema /  SCD / Other: Elevate legs to the level of the heart or above for 30 minutes daily and/or when sitting, a frequency of: - whenever sitting Avoid standing for long periods of time. Patient to wear own compression stockings every day. Exercise regularly Additional Orders / Instructions: Follow Nutritious Diet - Monitor blood sugars WOUND #11: - Lower Leg Wound Laterality: Left, Anterior Cleanser: Soap and Water 1 x Per Week/30 Days Discharge Instructions: May shower and wash wound with dial antibacterial soap and water prior to dressing change. Cleanser: Wound Cleanser 1 x Per Week/30 Days Discharge Instructions: Cleanse the wound with wound cleanser prior to applying a clean dressing using gauze sponges, not tissue or cotton balls. Peri-Wound Care: Sween Lotion (Moisturizing lotion) 1 x Per Week/30 Days Discharge Instructions: Apply moisturizing lotion as directed Prim Dressing: KerraCel Ag Gelling Fiber Dressing, 2x2 in (silver alginate) 1 x Per Week/30 Days ary Discharge Instructions: Apply silver alginate to wound bed as instructed Secondary Dressing: Woven Gauze Sponge, Non-Sterile 4x4 in 1 x Per Week/30 Days Discharge Instructions: Apply over primary dressing as directed. Secured With: Coban Self-Adherent Wrap 4x5 (in/yd) 1 x Per Week/30 Days Discharge Instructions: Secure with Coban as directed. Secured With: [employer], 4.5x3.1 (in/yd) 1 x Per Week/30 Days Discharge Instructions: Secure with Kerlix as directed. Secured With: 75M Medipore H Soft Cloth Surgical T ape, 4 x 10 (in/yd) 1 x Per Week/30 Days Discharge Instructions: Secure with tape as directed. WOUND #13: - Lower Leg Wound Laterality: Right, Medial Cleanser: Soap and Water 1 x Per Week/30 Days Discharge Instructions: May shower  and wash wound with dial antibacterial soap and water prior to dressing change. Cleanser: Wound Cleanser 1 x Per Week/30 Days Discharge Instructions: Cleanse the wound with wound cleanser prior to applying a clean dressing using gauze sponges, not tissue or cotton balls. Peri-Wound Care: Sween Lotion (Moisturizing lotion) 1 x Per Week/30 Days Discharge Instructions: Apply moisturizing lotion as directed Prim Dressing: KerraCel Ag Gelling Fiber Dressing, 2x2 in (silver alginate) 1 x Per Week/30 Days ary Discharge Instructions: Apply silver alginate to wound bed as instructed Secondary Dressing: Woven Gauze Sponge, Non-Sterile 4x4 in 1 x Per Week/30 Days Discharge Instructions: Apply over primary dressing as directed. Secured With: Coban Self-Adherent Wrap 4x5 (in/yd) 1 x Per Week/30 Days Discharge Instructions: Secure with Coban as directed. Secured With: [employer], 4.5x3.1 (in/yd) 1 x Per Week/30 Days Discharge Instructions: Secure with Kerlix as directed. Secured With: 75M Medipore H Soft Cloth Surgical T ape, 4 x 10 (in/yd) 1 x Per Week/30 Days Discharge Instructions: Secure with tape as directed. 04/25/2022: All of the wounds are little bit smaller with just a little eschar on their surfaces. The right anterior tibial wound is closed. I used a curette to debride eschar off of the wound surfaces. We will continue using silver alginate with Kerlix and Coban wraps. Follow-up in 1 week. Electronic Signature(s) Signed: 04/25/2022 3:47:44 PM By: Fredirick Maudlin MD FACS Entered By: Fredirick Maudlin on 04/25/2022 15:47:43 -------------------------------------------------------------------------------- HxROS Details Patient Name: Date of Service: Vincent [name], Vincent [name] ID M. 04/25/2022 3:00 PM Medical Record Number: [id number] Patient Account Number: [id number] Date of Birth/Sex: Treating RN: 1935/10/04 (86 y.o. Janyth Contes Primary Care Provider: [name] Other  Clinician: Referring Provider: Treating Provider/Extender: Celine Ahr in Treatment: 1 Information Obtained From Patient Chart Eyes Medical History: Positive for: Cataracts - left eye; Glaucoma Negative for: Optic Neuritis Respiratory Medical History: Positive for: Chronic Obstructive Pulmonary Disease (COPD) Past  Medical History Notes: pulmonary eosinophilia Cardiovascular Medical History: Positive for: Arrhythmia - afib; Congestive Heart Failure; Coronary Artery Disease; Hypertension; Myocardial Infarction Past Medical History Notes: hyperlipidemia, alcoholic cardiomyopathy Gastrointestinal Medical History: Past Medical History Notes: GERD Endocrine Medical History: Positive for: Type II Diabetes Negative for: Type I Diabetes Time with diabetes: 2 yrs Treated with: Oral agents Blood sugar tested every day: Yes Tested : Genitourinary Medical History: Negative for: End Stage Renal Disease Past Medical History Notes: incontinence, enlarged prostate Integumentary (Skin) Medical History: Negative for: History of Burn Musculoskeletal Medical History: Positive for: Gout Neurologic Medical History: Positive for: Neuropathy Past Medical History Notes: left foot drop, stroke Oncologic Medical History: Negative for: Received Chemotherapy; Received Radiation Psychiatric Medical History: Negative for: Anorexia/bulimia; Confinement Anxiety HBO Extended History Items Eyes: Eyes: Cataracts Glaucoma Immunizations Pneumococcal Vaccine: Received Pneumococcal Vaccination: Yes Received Pneumococcal Vaccination On or After 60th Birthday: Yes Implantable Devices No devices added Hospitalization / Surgery History Type of Hospitalization/Surgery cardiac cath with stents colonoscopy knee surgery Family and Social History Cancer: Yes - Mother,Siblings; Diabetes: No; Heart Disease: Yes - Father; Hereditary Spherocytosis: No; Hypertension: Yes -  Mother,Father; Kidney Disease: No; Lung Disease: No; Seizures: No; Stroke: No; Thyroid Problems: Yes - Siblings; Tuberculosis: No; Former smoker; Marital Status - Divorced; Alcohol Use: Never; Drug Use: No History; Caffeine Use: Rarely - coffee; Financial Concerns: No; Food, Clothing or Shelter Needs: No; Support System Lacking: No; Transportation Concerns: No Electronic Signature(s) Signed: 04/25/2022 4:56:31 PM By: Fredirick Maudlin MD FACS Signed: 04/25/2022 5:02:03 PM By: Adline Peals Entered By: Fredirick Maudlin on 04/25/2022 15:46:33 -------------------------------------------------------------------------------- [place] Details Patient Name: Date of Service: Vincent [name] ID M. 04/25/2022 Medical Record Number: [id number] Patient Account Number: [id number] Date of Birth/Sex: Treating RN: 08-13-1936 (86 y.o. 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 I87.2 Venous insufficiency (chronic) (peripheral) I50.9 Heart failure, unspecified E11.622 Type 2 diabetes mellitus with other skin ulcer Facility Procedures CPT4 Code: [id number] Description: [phone] - DEBRIDE WOUND 1ST 20 SQ CM OR < ICD-10 Diagnosis Description 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 L97.811 Non-pressure chronic ulcer of other part of right lower leg limited to  breakdown Modifier: f skin of skin Quantity: 1 Physician Procedures : CPT4 Code Description Modifier [id number] - WC PHYS LEVEL 3 - EST PT 25 ICD-10 Diagnosis Description L97.821 Non-pressure chronic ulcer of other part of left  lower leg limited to breakdown of skin L97.811 Non-pressure chronic ulcer of other part of  right lower leg limited to breakdown of skin I87.2 Venous insufficiency (chronic) (peripheral) E11.622 Type 2 diabetes mellitus with other skin ulcer Quantity: 1 : [id number] - WC PHYS DEBR WO ANESTH 20 SQ CM 1 ICD-10 Diagnosis Description 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f skin L97.811 Non-pressure chronic ulcer of other part of right lower leg limited  to breakdown of skin Quantity: Electronic Signature(s) Signed: 04/25/2022 3:48:02 PM By: Fredirick Maudlin MD FACS Entered By: Fredirick Maudlin on 04/25/2022 15:48:02

## 2022-04-26 ENCOUNTER — Telehealth (HOSPITAL_COMMUNITY): Payer: Self-pay | Admitting: *Deleted

## 2022-04-26 NOTE — Telephone Encounter (Signed)
Low risk, think this would be ok.

## 2022-04-26 NOTE — Telephone Encounter (Signed)
Per Dr.McLean pt is low risk and is ok to proceed with cataract surgery.

## 2022-04-26 NOTE — Telephone Encounter (Signed)
Pt left vm asking for clearance for cataract surgery.   Clearance needs to be faxed to [phone]

## 2022-04-26 NOTE — Telephone Encounter (Signed)
Faxed to [phone]

## 2022-05-04 ENCOUNTER — Encounter (HOSPITAL_BASED_OUTPATIENT_CLINIC_OR_DEPARTMENT_OTHER): Payer: Medicare Other | Attending: General Surgery | Admitting: General Surgery

## 2022-05-04 DIAGNOSIS — I2510 Atherosclerotic heart disease of native coronary artery without angina pectoris: Secondary | ICD-10-CM | POA: Insufficient documentation

## 2022-05-04 DIAGNOSIS — Z8673 Personal history of transient ischemic attack (TIA), and cerebral infarction without residual deficits: Secondary | ICD-10-CM | POA: Insufficient documentation

## 2022-05-04 DIAGNOSIS — Z7901 Long term (current) use of anticoagulants: Secondary | ICD-10-CM | POA: Diagnosis not present

## 2022-05-04 DIAGNOSIS — I4819 Other persistent atrial fibrillation: Secondary | ICD-10-CM | POA: Diagnosis not present

## 2022-05-04 DIAGNOSIS — I872 Venous insufficiency (chronic) (peripheral): Secondary | ICD-10-CM | POA: Insufficient documentation

## 2022-05-04 DIAGNOSIS — E11622 Type 2 diabetes mellitus with other skin ulcer: Secondary | ICD-10-CM | POA: Diagnosis not present

## 2022-05-04 DIAGNOSIS — L97811 Non-pressure chronic ulcer of other part of right lower leg limited to breakdown of skin: Secondary | ICD-10-CM | POA: Insufficient documentation

## 2022-05-04 DIAGNOSIS — I509 Heart failure, unspecified: Secondary | ICD-10-CM | POA: Diagnosis not present

## 2022-05-04 DIAGNOSIS — L97821 Non-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 leg limited to breakdown of skin: Secondary | ICD-10-CM | POA: Insufficient documentation

## 2022-05-04 DIAGNOSIS — I110 Hypertensive heart disease with heart failure: Secondary | ICD-10-CM | POA: Diagnosis not present

## 2022-05-04 NOTE — Progress Notes (Signed)
Vincent [name], Vincent M. ([id number]) Visit Report for 05/04/2022 Chief Complaint Document Details Patient Name: Date of Service: Vincent [name] ID M. 05/04/2022 3:45 PM Medical Record Number: [id number] Patient Account Number: [id number] Date of Birth/Sex: Treating RN: 04-15-1936 (86 y.o. M) Primary Care Provider: [name] Other Clinician: Referring Provider: Treating Provider/Extender: Celine Ahr in Treatment: 2 Information Obtained from: Patient Chief Complaint 07/15/2021 patient is here for wounds on his bilateral lower legs 04/15/2022: The patient returns to clinic with new bilateral leg wounds. Electronic Signature(s) Signed: 05/04/2022 3:49:18 PM By: Fredirick Maudlin MD FACS Entered By: Fredirick Maudlin on 05/04/2022 15:49:18 -------------------------------------------------------------------------------- HPI Details Patient Name: Date of Service: Vincent [name], Vincent V ID M. 05/04/2022 3:45 PM Medical Record Number: [id number] Patient Account Number: [id number] Date of Birth/Sex: Treating RN: 1936/01/07 (86 y.o. M) Primary Care Provider: [name] Other Clinician: Referring Provider: Treating Provider/Extender: Celine Ahr in Treatment: 2 History of Present Illness HPI Description: ADMISSION 07/15/2021 This is an 86 year old man who came here for evaluation of wounds on his bilateral predominantly anterior lower legs mid aspect of the tibia. He seems to have been dealing with this for most of this year. Currently with 3 wounds on the right and 2 on the left. At least some of these seem to start off as blisters probably the majority. He has been following with Dr. Anabel Bene of [facility] dermatology according the patient back he has an appointment Dr. Anabel Bene tomorrow he was given a prescription for triamcinolone which she has been applying daily in fact it was renewed yesterday. Patient is a diabetic no recent hemoglobin A1c. He is on  Coumadin for persistent atrial fibrillation. Vein and vascular in December 2021 at which time he had bilateral foot pain and finger pain. ABIs bilaterally were noncompressible however the TBI on the right was only 0.39. There were biphasic waveforms on the left his ABI was noncompressible with biphasic waveforms but with a TBI of 0.6. He was not felt to have a primary vascular issue. There is a mention of Raynaud's phenomenon as well. Past medical history includes cellulitis of the right leg in June 2022, type 2 diabetes, persistent lower extremity edema, bilateral foot and ankle pain, Raynaud's phenomenon, coronary artery disease, congestive heart failure, pacemaker, none sustained ventricular tachycardia, remote CVA paroxysmal atrial fib on Coumadin. We did not reattempt ABIs in our clinic 07/22/2019 patient appears to be doing decently well in regard to his wounds as compared to last week with the wounds that were present last week. Fortunately I do not see any signs of active infection which is great news. No fevers, chills, nausea, vomiting, or diarrhea. With that being said he has several new blisters that are getting need to be addressed today. 07/28/2021 upon evaluation today patient actually appears to be making excellent progress. I am extremely pleased with where we stand today. I think that his wounds are significantly improved compared to where they were previous. 08/04/2021 upon evaluation today patient appears to be doing much better in regard to his wounds in general. I feel like across the board he is showing signs of significant improvement. Fortunately there is no evidence of infection which is great news. No fevers, chills, nausea, vomiting, or diarrhea. 08/11/2021 upon evaluation today patient actually appears to be doing quite well in regard to his wound. Has been tolerating the dressing changes without complication. Fortunately I do not see any signs of infection currently which  is great news as well.  No fevers, chills, nausea, vomiting, or diarrhea. 08/18/2021 on evaluation today patient appears to be doing excellent he is making good progress and overall I am extremely pleased with where we stand today. There does not appear to be any signs of active infection at this time which is great news. 09/01/2020 upon evaluation patient's wounds on both the left and the right appear to be at 0.1 cm measurement and are very close to complete resolution. Were not quite completely done yet but this is very close. Overall I am extremely happy with where things stand and I think he is headed in the right direction. 09/08/2021 upon evaluation today patient appears to be doing well in regard to his right leg which is showing signs of being completely healed which is excellent. Fortunately I do not see any evidence of active infection locally nor systemically at this point which is also great news. No fevers, chills, nausea, vomiting, or diarrhea. 09/15/2021; this is a patient we have been following for now a small wound on the left dorsal foot. He had wounds on the right legs and was discharged to 20/30 below-knee stockings apparently in late December. He came in today with marked increase in swelling in the right leg large areas of denuded skin which probably started as blisters. He did not have a stocking on 10/06/2021 upon evaluation today patient appears to be doing decently well in regard to his wounds. On the left leg he is completely healed on the right leg he does still have a small area that is open although again its not as good as it was last time I saw him is also not as bad as it was in the beginning. He has been in the hospital and they did not wrap him as part of the issue I believe here as well. 10/20/2021 upon evaluation today patient appears to be doing well with regard to his leg ulcer. He has been tolerating the dressing changes without complication. Fortunately I do not see  any signs of active infection locally or systemically at this point. No fevers, chills, nausea, vomiting, or diarrhea. 10/27/2021 upon evaluation today patient appears to be doing well with regard to his wound. In fact this appears to be almost completely healed which is great news. Fortunately I do not see any evidence of active infection locally nor systemically which is great news. No fevers, chills, nausea, vomiting, or diarrhea. 11/03/2021 upon evaluation today patient is very close to complete resolution he just has a very tiny area on the right leg still open at this point. He did go get socks from [place] as I discussed with him at the last visit unfortunately he actually got diabetic socks and compression socks. I explained those are not the same thing and he voiced understanding he is going to go to a different [facility] and try to find exactly what we are looking for. Fortunately I do not see any signs of infection and I think he is very close to discharge will probably be ready for next week. 11/10/2021: T oday the patient's right lower extremity wound is closed. He was able to get the low grade compression socks from [employer] and is wearing 1 on the left leg. READMISSION 04/15/2022 The patient returns to clinic today with new bilateral lower extremity leg wounds. He says that he has not been using moisturizer regularly and is not wearing his compression stockings. The wounds are superficial. He has eschar buildup on the surfaces and thickened dry,  cracked skin. Under the eschar, there is a little bit of slough on the wound surfaces. 04/25/2022: All of the wounds are little bit smaller with just a little eschar on their surfaces. The right anterior tibial wound is closed. 05/04/2022: All of his wounds are healed. Electronic Signature(s) Signed: 05/04/2022 3:49:51 PM By: Fredirick Maudlin MD FACS Entered By: Fredirick Maudlin on 05/04/2022  15:49:51 -------------------------------------------------------------------------------- Physical Exam Details Patient Name: Date of Service: Vincent [name], Vincent V ID M. 05/04/2022 3:45 PM Medical Record Number: [id number] Patient Account Number: [id number] Date of Birth/Sex: Treating RN: September 07, 1935 (86 y.o. M) Primary Care Provider: [name] Other Clinician: Referring Provider: Treating Provider/Extender: Celine Ahr in Treatment: 2 Constitutional Hypertensive, asymptomatic. Bradycardic, asymptomatic.. . . No acute distress.Marland Kitchen Respiratory Normal work of breathing on room air.. Notes 05/04/2022: All of his wounds are healed. Electronic Signature(s) Signed: 05/04/2022 3:50:26 PM By: Fredirick Maudlin MD FACS Entered By: Fredirick Maudlin on 05/04/2022 15:50:26 -------------------------------------------------------------------------------- Physician Orders Details Patient Name: Date of Service: Vincent [name], Vincent V ID M. 05/04/2022 3:45 PM Medical Record Number: [id number] Patient Account Number: [id number] Date of Birth/Sex: Treating RN: 1936-03-04 (86 y.o. Vincent [name] Primary Care Provider: [name] Other Clinician: Referring Provider: Treating Provider/Extender: Celine Ahr in Treatment: 2 Verbal / Phone Orders: No Diagnosis Coding ICD-10 Coding Code Description [phone] Non-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 leg limited to breakdown of skin L97.811 Non-pressure chronic ulcer of other part of right lower leg limited to breakdown of skin I87.2 Venous insufficiency (chronic) (peripheral) I50.9 Heart failure, unspecified E11.622 Type 2 diabetes mellitus with other skin ulcer Discharge From [facility] Services Discharge from [place]!!!!!! Edema Control - Lymphedema / SCD / Other Bilateral Lower Extremities Elevate legs to the level of the heart or above for 30 minutes daily and/or when  sitting, a frequency of: - whenever sitting Avoid standing for long periods of time. Patient to wear own compression stockings every day. - put them on in the morning and take them off before you go bed Exercise regularly Additional Orders / Instructions Follow Nutritious Diet - Monitor blood sugars Electronic Signature(s) Signed: 05/04/2022 3:50:44 PM By: Fredirick Maudlin MD FACS Entered By: Fredirick Maudlin on 05/04/2022 15:50:43 -------------------------------------------------------------------------------- Problem List Details Patient Name: Date of Service: Vincent [name], Shaune Pascal ID M. 05/04/2022 3:45 PM Medical Record Number: [id number] Patient Account Number: [id number] Date of Birth/Sex: Treating RN: June 12, 1936 (86 y.o. M) Primary Care Provider: [name] Other Clinician: Referring Provider: Treating Provider/Extender: Celine Ahr in Treatment: 2 Active Problems ICD-10 Encounter Code Description Active Date MDM Diagnosis [phone] Non-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 leg limited to breakdown 04/15/2022 No Yes of skin L97.811 Non-pressure chronic ulcer of other part of right lower leg limited to breakdown 04/15/2022 No Yes of skin I87.2 Venous insufficiency (chronic) (peripheral) 04/15/2022 No Yes I50.9 Heart failure, unspecified 04/15/2022 No Yes E11.622 Type 2 diabetes mellitus with other skin ulcer 04/15/2022 No Yes Inactive Problems Resolved Problems Electronic Signature(s) Signed: 05/04/2022 3:49:07 PM By: Fredirick Maudlin MD FACS Entered By: Fredirick Maudlin on 05/04/2022 15:49:07 -------------------------------------------------------------------------------- Progress Note Details Patient Name: Date of Service: Vincent [name], Vincent V ID M. 05/04/2022 3:45 PM Medical Record Number: [id number] Patient Account Number: [id number] Date of Birth/Sex: Treating RN: 16-Jun-1936 (86 y.o. M) Primary Care Provider: [name] Other Clinician: Referring  Provider: Treating Provider/Extender: Celine Ahr in Treatment: 2 Subjective Chief Complaint Information obtained from  Patient 07/15/2021 patient is here for wounds on his bilateral lower legs 04/15/2022: The patient returns to clinic with new bilateral leg wounds. History of Present Illness (HPI) ADMISSION 07/15/2021 This is an 86 year old man who came here for evaluation of wounds on his bilateral predominantly anterior lower legs mid aspect of the tibia. He seems to have been dealing with this for most of this year. Currently with 3 wounds on the right and 2 on the left. At least some of these seem to start off as blisters probably the majority. He has been following with Dr. Anabel Bene of [facility] dermatology according the patient back he has an appointment Dr. Anabel Bene tomorrow he was given a prescription for triamcinolone which she has been applying daily in fact it was renewed yesterday. Patient is a diabetic no recent hemoglobin A1c. He is on Coumadin for persistent atrial fibrillation. Vein and vascular in December 2021 at which time he had bilateral foot pain and finger pain. ABIs bilaterally were noncompressible however the TBI on the right was only 0.39. There were biphasic waveforms on the left his ABI was noncompressible with biphasic waveforms but with a TBI of 0.6. He was not felt to have a primary vascular issue. There is a mention of Raynaud's phenomenon as well. Past medical history includes cellulitis of the right leg in June 2022, type 2 diabetes, persistent lower extremity edema, bilateral foot and ankle pain, Raynaud's phenomenon, coronary artery disease, congestive heart failure, pacemaker, none sustained ventricular tachycardia, remote CVA paroxysmal atrial fib on Coumadin. We did not reattempt ABIs in our clinic 07/22/2019 patient appears to be doing decently well in regard to his wounds as compared to last week with the wounds that were  present last week. Fortunately I do not see any signs of active infection which is great news. No fevers, chills, nausea, vomiting, or diarrhea. With that being said he has several new blisters that are getting need to be addressed today. 07/28/2021 upon evaluation today patient actually appears to be making excellent progress. I am extremely pleased with where we stand today. I think that his wounds are significantly improved compared to where they were previous. 08/04/2021 upon evaluation today patient appears to be doing much better in regard to his wounds in general. I feel like across the board he is showing signs of significant improvement. Fortunately there is no evidence of infection which is great news. No fevers, chills, nausea, vomiting, or diarrhea. 08/11/2021 upon evaluation today patient actually appears to be doing quite well in regard to his wound. Has been tolerating the dressing changes without complication. Fortunately I do not see any signs of infection currently which is great news as well. No fevers, chills, nausea, vomiting, or diarrhea. 08/18/2021 on evaluation today patient appears to be doing excellent he is making good progress and overall I am extremely pleased with where we stand today. There does not appear to be any signs of active infection at this time which is great news. 09/01/2020 upon evaluation patient's wounds on both the left and the right appear to be at 0.1 cm measurement and are very close to complete resolution. Were not quite completely done yet but this is very close. Overall I am extremely happy with where things stand and I think he is headed in the right direction. 09/08/2021 upon evaluation today patient appears to be doing well in regard to his right leg which is showing signs of being completely healed which is excellent. Fortunately I do not see any  evidence of active infection locally nor systemically at this point which is also great news. No fevers,  chills, nausea, vomiting, or diarrhea. 09/15/2021; this is a patient we have been following for now a small wound on the left dorsal foot. He had wounds on the right legs and was discharged to 20/30 below-knee stockings apparently in late December. He came in today with marked increase in swelling in the right leg large areas of denuded skin which probably started as blisters. He did not have a stocking on 10/06/2021 upon evaluation today patient appears to be doing decently well in regard to his wounds. On the left leg he is completely healed on the right leg he does still have a small area that is open although again its not as good as it was last time I saw him is also not as bad as it was in the beginning. He has been in the hospital and they did not wrap him as part of the issue I believe here as well. 10/20/2021 upon evaluation today patient appears to be doing well with regard to his leg ulcer. He has been tolerating the dressing changes without complication. Fortunately I do not see any signs of active infection locally or systemically at this point. No fevers, chills, nausea, vomiting, or diarrhea. 10/27/2021 upon evaluation today patient appears to be doing well with regard to his wound. In fact this appears to be almost completely healed which is great news. Fortunately I do not see any evidence of active infection locally nor systemically which is great news. No fevers, chills, nausea, vomiting, or diarrhea. 11/03/2021 upon evaluation today patient is very close to complete resolution he just has a very tiny area on the right leg still open at this point. He did go get socks from [place] as I discussed with him at the last visit unfortunately he actually got diabetic socks and compression socks. I explained those are not the same thing and he voiced understanding he is going to go to a different [facility] and try to find exactly what we are looking for. Fortunately I do not see any signs of  infection and I think he is very close to discharge will probably be ready for next week. 11/10/2021: T oday the patient's right lower extremity wound is closed. He was able to get the low grade compression socks from [employer] and is wearing 1 on the left leg. READMISSION 04/15/2022 The patient returns to clinic today with new bilateral lower extremity leg wounds. He says that he has not been using moisturizer regularly and is not wearing his compression stockings. The wounds are superficial. He has eschar buildup on the surfaces and thickened dry, cracked skin. Under the eschar, there is a little bit of slough on the wound surfaces. 04/25/2022: All of the wounds are little bit smaller with just a little eschar on their surfaces. The right anterior tibial wound is closed. 05/04/2022: All of his wounds are healed. Patient History Information obtained from Patient, Chart. Family History Cancer - Mother,Siblings, Heart Disease - Father, Hypertension - Mother,Father, Thyroid Problems - Siblings, No family history of Diabetes, Hereditary Spherocytosis, Kidney Disease, Lung Disease, Seizures, Stroke, Tuberculosis. Social History Former smoker, Marital Status - Divorced, Alcohol Use - Never, Drug Use - No History, Caffeine Use - Rarely - coffee. Medical History Eyes Patient has history of Cataracts - left eye, Glaucoma Denies history of Optic Neuritis Respiratory Patient has history of Chronic Obstructive Pulmonary Disease (COPD) Cardiovascular Patient has history of  Arrhythmia - afib, Congestive Heart Failure, Coronary Artery Disease, Hypertension, Myocardial Infarction Endocrine Patient has history of Type II Diabetes Denies history of Type I Diabetes Genitourinary Denies history of End Stage Renal Disease Integumentary (Skin) Denies history of History of Burn Musculoskeletal Patient has history of Gout Neurologic Patient has history of Neuropathy Oncologic Denies history of Received  Chemotherapy, Received Radiation Psychiatric Denies history of Anorexia/bulimia, Confinement Anxiety Hospitalization/Surgery History - cardiac cath with stents. - colonoscopy. - knee surgery. Medical A Surgical History Notes nd Respiratory pulmonary eosinophilia Cardiovascular hyperlipidemia, alcoholic cardiomyopathy Gastrointestinal GERD Genitourinary incontinence, enlarged prostate Neurologic left foot drop, stroke Objective Constitutional Hypertensive, asymptomatic. Bradycardic, asymptomatic.Marland Kitchen No acute distress.. Vitals Time Taken: 2:22 PM, Height: 69 in, Weight: 175 lbs, BMI: 25.8, Temperature: 97.7 F, Pulse: 48 bpm, Respiratory Rate: 18 breaths/min, Blood Pressure: 157/72 mmHg, Capillary Blood Glucose: 100 mg/dl. Respiratory Normal work of breathing on room air.. General Notes: 05/04/2022: All of his wounds are healed. Integumentary (Hair, Skin) Wound #11 status is Open. Original cause of wound was Blister. The date acquired was: 01/27/2022. The wound has been in treatment 2 weeks. The wound is located on the Left,Anterior Lower Leg. The wound measures 0cm length x 0cm width x 0cm depth; 0cm^2 area and 0cm^3 volume. There is Fat Layer (Subcutaneous Tissue) exposed. There is a medium amount of serosanguineous drainage noted. The wound margin is distinct with the outline attached to the wound base. There is large (67-100%) red granulation within the wound bed. There is a small (1-33%) amount of necrotic tissue within the wound bed including Adherent Slough. Wound #13 status is Open. Original cause of wound was Blister. The date acquired was: 01/27/2022. The wound has been in treatment 2 weeks. The wound is located on the Right,Medial Lower Leg. The wound measures 0cm length x 0cm width x 0cm depth; 0cm^2 area and 0cm^3 volume. There is Fat Layer (Subcutaneous Tissue) exposed. There is a medium amount of serosanguineous drainage noted. The wound margin is distinct with the outline  attached to the wound base. There is large (67-100%) red granulation within the wound bed. There is a small (1-33%) amount of necrotic tissue within the wound bed including Adherent Slough. Assessment Active Problems ICD-10 Non-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 leg limited to breakdown of skin Non-pressure chronic ulcer of other part of right lower leg limited to breakdown of skin Venous insufficiency (chronic) (peripheral) Heart failure, unspecified Type 2 diabetes mellitus with other skin ulcer Plan Discharge From [facility] Services: Discharge from [place]!!!!!! Edema Control - Lymphedema / SCD / Other: Elevate legs to the level of the heart or above for 30 minutes daily and/or when sitting, a frequency of: - whenever sitting Avoid standing for long periods of time. Patient to wear own compression stockings every day. - put them on in the morning and take them off before you go bed Exercise regularly Additional Orders / Instructions: Follow Nutritious Diet - Monitor blood sugars 05/04/2022: All of his wounds are healed. He was reminded that it is imperative that he wear compression stockings religiously, moisturize his skin, and elevate his legs throughout the day and at night in order to prevent a recurrence of his ulcers. We will discharge him from the wound care center. He will follow-up on an as-needed basis. Electronic Signature(s) Signed: 05/04/2022 3:51:25 PM By: Fredirick Maudlin MD FACS Entered By: Fredirick Maudlin on 05/04/2022 15:51:25 -------------------------------------------------------------------------------- HxROS Details Patient Name: Date of Service: Vincent [name], Vincent V ID  M. 05/04/2022 3:45 PM Medical Record Number: [id number] Patient Account Number: [id number] Date of Birth/Sex: Treating RN: 12/03/1935 (86 y.o. M) Primary Care Provider: [name] Other Clinician: Referring Provider: Treating Provider/Extender: Celine Ahr in Treatment: 2 Information Obtained From Patient Chart Eyes Medical History: Positive for: Cataracts - left eye; Glaucoma Negative for: Optic Neuritis Respiratory Medical History: Positive for: Chronic Obstructive Pulmonary Disease (COPD) Past Medical History Notes: pulmonary eosinophilia Cardiovascular Medical History: Positive for: Arrhythmia - afib; Congestive Heart Failure; Coronary Artery Disease; Hypertension; Myocardial Infarction Past Medical History Notes: hyperlipidemia, alcoholic cardiomyopathy Gastrointestinal Medical History: Past Medical History Notes: GERD Endocrine Medical History: Positive for: Type II Diabetes Negative for: Type I Diabetes Time with diabetes: 2 yrs Treated with: Oral agents Blood sugar tested every day: Yes Tested : Genitourinary Medical History: Negative for: End Stage Renal Disease Past Medical History Notes: incontinence, enlarged prostate Integumentary (Skin) Medical History: Negative for: History of Burn Musculoskeletal Medical History: Positive for: Gout Neurologic Medical History: Positive for: Neuropathy Past Medical History Notes: left foot drop, stroke Oncologic Medical History: Negative for: Received Chemotherapy; Received Radiation Psychiatric Medical History: Negative for: Anorexia/bulimia; Confinement Anxiety HBO Extended History Items Eyes: Eyes: Cataracts Glaucoma Immunizations Pneumococcal Vaccine: Received Pneumococcal Vaccination: Yes Received Pneumococcal Vaccination On or After 60th Birthday: Yes Implantable Devices No devices added Hospitalization / Surgery History Type of Hospitalization/Surgery cardiac cath with stents colonoscopy knee surgery Family and Social History Cancer: Yes - Mother,Siblings; Diabetes: No; Heart Disease: Yes - Father; Hereditary Spherocytosis: No; Hypertension: Yes - Mother,Father; Kidney Disease: No; Lung Disease: No; Seizures: No;  Stroke: No; Thyroid Problems: Yes - Siblings; Tuberculosis: No; Former smoker; Marital Status - Divorced; Alcohol Use: Never; Drug Use: No History; Caffeine Use: Rarely - coffee; Financial Concerns: No; Food, Clothing or Shelter Needs: No; Support System Lacking: No; Transportation Concerns: No Electronic Signature(s) Signed: 05/04/2022 4:48:35 PM By: Fredirick Maudlin MD FACS Entered By: Fredirick Maudlin on 05/04/2022 15:49:57 -------------------------------------------------------------------------------- SuperBill Details Patient Name: Date of Service: Vincent [name], Vincent V ID M. 05/04/2022 Medical Record Number: [id number] Patient Account Number: [id number] Date of Birth/Sex: Treating RN: 1936-03-26 (86 y.o. M) Primary Care Provider: [name] Other Clinician: Referring Provider: Treating Provider/Extender: Celine Ahr in Treatment: 2 Diagnosis Coding ICD-10 Codes Code Description [phone] Non-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 leg limited to breakdown of skin L97.811 Non-pressure chronic ulcer of other part of right lower leg limited to breakdown of skin I87.2 Venous insufficiency (chronic) (peripheral) I50.9 Heart failure, unspecified E11.622 Type 2 diabetes mellitus with other skin ulcer Facility Procedures CPT4 Code: [id number] Description: [phone] - WOUND CARE VISIT-LEV 2 EST PT Modifier: Quantity: 1 Physician Procedures : CPT4 Code Description Modifier [id number] - WC PHYS LEVEL 3 - EST PT 25 ICD-10 Diagnosis Description 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f skin L97.811 Non-pressure chronic ulcer of other part of  right lower leg limited to breakdown of skin I87.2 Venous insufficiency (chronic) (peripheral) E11.622 Type 2 diabetes mellitus with other skin ulcer Quantity: 1 Electronic Signature(s) Signed: 05/04/2022 4:25:01 PM By: Adline Peals Signed: 05/04/2022 4:48:35 PM By: Fredirick Maudlin MD FACS Previous Signature: 05/04/2022 3:51:55 PM Version By: Fredirick Maudlin MD FACS Entered By: Adline Peals on 05/04/2022 15:53:46

## 2022-05-05 NOTE — Progress Notes (Signed)
Miske, Stephens M. ([id number]) Visit Report for 05/04/2022 Arrival Information Details Patient Name: Date of Service: Vincent [name] ID M. 05/04/2022 3:45 [name] Medical Record Number: [id number] Patient Account Number: [id number] Date of Birth/Sex: Treating RN: [birth year] (86 y.o. M) Primary Care Kahlea Cobert: [name] Other Clinician: Referring Ryn Peine: Treating Shamya Macfadden/Extender: Celine Ahr in Treatment: 2 Visit Information History Since Last Visit Added or deleted any medications: No Patient Arrived: Ambulatory Any new allergies or adverse reactions: No Arrival Time: 15:21 Had a fall or experienced change in No Accompanied By: self activities of daily living that may affect Transfer Assistance: None risk of falls: Patient Identification Verified: Yes Signs or symptoms of abuse/neglect since last visito No Secondary Verification Process Completed: Yes Hospitalized since last visit: No Patient Requires Transmission-Based Precautions: No Implantable device outside of the clinic excluding No Patient Has Alerts: Yes cellular tissue based products placed in the center Patient Alerts: Patient on Blood Thinner since last visit: Has Dressing in Place as Prescribed: Yes Pain Present Now: No Electronic Signature(s) Signed: 05/05/2022 4:30:53 [name] By: Sandre Kitty Entered By: Sandre Kitty on 05/04/2022 15:21:57 -------------------------------------------------------------------------------- Clinic Level of Care Assessment Details Patient Name: Date of Service: Vincent [name] ID M. 05/04/2022 3:45 [name] Medical Record Number: [id number] Patient Account Number: [id number] Date of Birth/Sex: Treating RN: 07/15/1936 (86 y.o. Janyth Contes Primary Care Skylier Kretschmer: [name] Other Clinician: Referring Flor Houdeshell: Treating Josey Dettmann/Extender: Celine Ahr in Treatment: 2 Clinic Level of Care Assessment Items TOOL 4 Quantity Score X- 1  0 Use when only an EandM is performed on FOLLOW-UP visit ASSESSMENTS - Nursing Assessment / Reassessment X- 1 10 Reassessment of Co-morbidities (includes updates in patient status) X- 1 5 Reassessment of Adherence to Treatment Plan ASSESSMENTS - Wound and Skin A ssessment / Reassessment X - Simple Wound Assessment / Reassessment - one wound 1 5 '[]'$  - 0 Complex Wound Assessment / Reassessment - multiple wounds '[]'$  - 0 Dermatologic / Skin Assessment (not related to wound area) ASSESSMENTS - Focused Assessment '[]'$  - 0 Circumferential Edema Measurements - multi extremities '[]'$  - 0 Nutritional Assessment / Counseling / Intervention '[]'$  - 0 Lower Extremity Assessment (monofilament, tuning fork, pulses) '[]'$  - 0 Peripheral Arterial Disease Assessment (using hand held doppler) ASSESSMENTS - Ostomy and/or Continence Assessment and Care '[]'$  - 0 Incontinence Assessment and Management '[]'$  - 0 Ostomy Care Assessment and Management (repouching, etc.) PROCESS - Coordination of Care X - Simple Patient / Family Education for ongoing care 1 15 '[]'$  - 0 Complex (extensive) Patient / Family Education for ongoing care X- 1 10 Staff obtains Programmer, systems, Records, T Results / Process Orders est '[]'$  - 0 Staff telephones HHA, Nursing Homes / Clarify orders / etc '[]'$  - 0 Routine Transfer to another Facility (non-emergent condition) '[]'$  - 0 Routine Hospital Admission (non-emergent condition) '[]'$  - 0 New Admissions / Biomedical engineer / Ordering NPWT Apligraf, etc. , '[]'$  - 0 Emergency Hospital Admission (emergent condition) X- 1 10 Simple Discharge Coordination '[]'$  - 0 Complex (extensive) Discharge Coordination PROCESS - Special Needs '[]'$  - 0 Pediatric / Minor Patient Management '[]'$  - 0 Isolation Patient Management '[]'$  - 0 Hearing / Language / Visual special needs '[]'$  - 0 Assessment of Community assistance (transportation, D/C planning, etc.) '[]'$  - 0 Additional assistance / Altered mentation '[]'$  -  0 Support Surface(s) Assessment (bed, cushion, seat, etc.) INTERVENTIONS - Wound Cleansing / Measurement X - Simple Wound Cleansing - one wound 1 5 '[]'$  - 0  Complex Wound Cleansing - multiple wounds X- 1 5 Wound Imaging (photographs - any number of wounds) '[]'$  - 0 Wound Tracing (instead of photographs) '[]'$  - 0 Simple Wound Measurement - one wound '[]'$  - 0 Complex Wound Measurement - multiple wounds INTERVENTIONS - Wound Dressings '[]'$  - 0 Small Wound Dressing one or multiple wounds '[]'$  - 0 Medium Wound Dressing one or multiple wounds '[]'$  - 0 Large Wound Dressing one or multiple wounds '[]'$  - 0 Application of Medications - topical '[]'$  - 0 Application of Medications - injection INTERVENTIONS - Miscellaneous '[]'$  - 0 External ear exam '[]'$  - 0 Specimen Collection (cultures, biopsies, blood, body fluids, etc.) '[]'$  - 0 Specimen(s) / Culture(s) sent or taken to Lab for analysis '[]'$  - 0 Patient Transfer (multiple staff / Civil Service fast streamer / Similar devices) '[]'$  - 0 Simple Staple / Suture removal (25 or less) '[]'$  - 0 Complex Staple / Suture removal (26 or more) '[]'$  - 0 Hypo / Hyperglycemic Management (close monitor of Blood Glucose) '[]'$  - 0 Ankle / Brachial Index (ABI) - do not check if billed separately X- 1 5 Vital Signs Has the patient been seen at the hospital within the last three years: Yes Total Score: 70 Level Of Care: New/Established - Level 2 Electronic Signature(s) Signed: 05/04/2022 4:25:01 [name] By: Adline Peals Entered By: Adline Peals on 05/04/2022 15:53:40 -------------------------------------------------------------------------------- Encounter Discharge Information Details Patient Name: Date of Service: Vincent [name], Vincent [name] ID M. 05/04/2022 3:45 [name] Medical Record Number: [id number] Patient Account Number: [id number] Date of Birth/Sex: Treating RN: October 26, 1935 (86 y.o. Janyth Contes Primary Care Paitynn Mikus: [name] Other Clinician: Referring Wilberth Damon: Treating  Witt Plitt/Extender: Celine Ahr in Treatment: 2 Encounter Discharge Information Items Discharge Condition: Stable Ambulatory Status: Ambulatory Discharge Destination: Home Transportation: Private Auto Accompanied By: self Schedule Follow-up Appointment: No Clinical Summary of Care: Patient Declined Electronic Signature(s) Signed: 05/04/2022 4:25:01 [name] By: Sabas Sous By: Adline Peals on 05/04/2022 15:54:19 -------------------------------------------------------------------------------- Lower Extremity Assessment Details Patient Name: Date of Service: Vincent [name] ID M. 05/04/2022 3:45 [name] Medical Record Number: [id number] Patient Account Number: [id number] Date of Birth/Sex: Treating RN: July 19, 1936 (86 y.o. Janyth Contes Primary Care Ercole Georg: [name] Other Clinician: Referring Fareeha Evon: Treating Izaia Say/Extender: Corliss Marcus Weeks in Treatment: 2 Edema Assessment Assessed: [Left: No] [Right: No] [Left: Edema] [Right: :] Calf Left: Right: Point of Measurement: From Medial Instep 35.9 cm 37.5 cm Ankle Left: Right: Point of Measurement: From Medial Instep 23 cm 21 cm Electronic Signature(s) Signed: 05/04/2022 4:25:01 [name] By: Adline Peals Entered By: Adline Peals on 05/04/2022 15:37:11 -------------------------------------------------------------------------------- Multi Wound Chart Details Patient Name: Date of Service: Vincent [name] ID M. 05/04/2022 3:45 [name] Medical Record Number: [id number] Patient Account Number: [id number] Date of Birth/Sex: Treating RN: August 07, 1936 (86 y.o. M) Primary Care Teah Votaw: [name] Other Clinician: Referring Eva Vallee: Treating Iyah Laguna/Extender: Celine Ahr in Treatment: 2 Vital Signs Height(in): 34 Capillary Blood Glucose(mg/dl): 100 Weight(lbs): 175 Pulse(bpm): 54 Body Mass Index(BMI): 25.8 Blood Pressure(mmHg):  157/72 Temperature(F): 97.7 Respiratory Rate(breaths/min): 18 Photos: [N/A:N/A] Left, Anterior Lower Leg Right, Medial Lower Leg N/A Wound Location: Blister Blister N/A Wounding Event: Venous Leg Ulcer Venous Leg Ulcer N/A Primary Etiology: Cataracts, Glaucoma, Chronic Cataracts, Glaucoma, Chronic N/A Comorbid History: Obstructive Pulmonary Disease Obstructive Pulmonary Disease (COPD), Arrhythmia, Congestive (COPD), Arrhythmia, Congestive Heart Failure, Coronary Artery Heart Failure, Coronary Artery Disease, Hypertension, Myocardial Disease, Hypertension, Myocardial Infarction, Type II Diabetes, Gout, Infarction, Type II Diabetes, Gout, Neuropathy Neuropathy  01/27/2022 01/27/2022 N/A Date Acquired: 2 2 N/A Weeks of Treatment: Open Open N/A Wound Status: No No N/A Wound Recurrence: 0x0x0 0x0x0 N/A Measurements L x W x D (cm) 0 0 N/A A (cm) : rea 0 0 N/A Volume (cm) : 100.00% 100.00% N/A % Reduction in Area: 100.00% 100.00% N/A % Reduction in Volume: Full Thickness Without Exposed Full Thickness Without Exposed N/A Classification: Support Structures Support Structures Medium Medium N/A Exudate Amount: Serosanguineous Serosanguineous N/A Exudate Type: red, brown red, brown N/A Exudate Color: Distinct, outline attached Distinct, outline attached N/A Wound Margin: Large (67-100%) Large (67-100%) N/A Granulation Amount: Red Red N/A Granulation Quality: Small (1-33%) Small (1-33%) N/A Necrotic Amount: Fat Layer (Subcutaneous Tissue): Yes Fat Layer (Subcutaneous Tissue): Yes N/A Exposed Structures: Fascia: No Fascia: No Tendon: No Tendon: No Muscle: No Muscle: No Joint: No Joint: No Bone: No Bone: No Small (1-33%) Large (67-100%) N/A Epithelialization: Treatment Notes Electronic Signature(s) Signed: 05/04/2022 3:49:13 [name] By: Fredirick Maudlin MD FACS Signed: 05/04/2022 3:49:13 [name] By: Fredirick Maudlin MD FACS Entered By: Fredirick Maudlin on 05/04/2022  15:49:13 -------------------------------------------------------------------------------- Multi-Disciplinary Care Plan Details Patient Name: Date of Service: Vincent [name], Vincent V ID M. 05/04/2022 3:45 [name] Medical Record Number: [id number] Patient Account Number: [id number] Date of Birth/Sex: Treating RN: [birth year] (86 y.o. Janyth Contes Primary Care Isaly Fasching: [name] Other Clinician: Referring Jaedan Schuman: Treating Akisha Sturgill/Extender: Celine Ahr in Treatment: 2 Active Inactive Electronic Signature(s) Signed: 05/04/2022 4:25:01 [name] By: Sabas Sous By: Adline Peals on 05/04/2022 15:54:49 -------------------------------------------------------------------------------- Pain Assessment Details Patient Name: Date of Service: Vincent [name], Vincent [name] ID M. 05/04/2022 3:45 [name] Medical Record Number: [id number] Patient Account Number: [id number] Date of Birth/Sex: Treating RN: 04/05/1936 (86 y.o. M) Primary Care Cassia Fein: [name] Other Clinician: Referring Shikha Bibb: Treating Keelee Yankey/Extender: Celine Ahr in Treatment: 2 Active Problems Location of Pain Severity and Description of Pain Patient Has Paino No Site Locations Pain Management and Medication Current Pain Management: Electronic Signature(s) Signed: 05/05/2022 4:30:53 [name] By: Sandre Kitty Entered By: Sandre Kitty on 05/04/2022 15:23:09 -------------------------------------------------------------------------------- Patient/Caregiver Education Details Patient Name: Date of Service: Vincent [name] Medical Record Number: [id number] Patient Account Number: [id number] Date of Birth/Gender: Treating RN: 08/28/1936 (86 y.o. Janyth Contes Primary Care Physician: [name] Other Clinician: Referring Physician: Treating Physician/Extender: Celine Ahr in Treatment: 2 Education  Assessment Education Provided To: Patient Education Topics Provided Wound/Skin Impairment: Methods: Explain/Verbal Responses: Reinforcements needed, State content correctly Electronic Signature(s) Signed: 05/04/2022 4:25:01 [name] By: Adline Peals Entered By: Adline Peals on 05/04/2022 15:37:04 -------------------------------------------------------------------------------- Wound Assessment Details Patient Name: Date of Service: Vincent [name] ID M. 05/04/2022 3:45 [name] Medical Record Number: [id number] Patient Account Number: [id number] Date of Birth/Sex: Treating RN: 1936-03-27 (86 y.o. M) Primary Care Audrea Bolte: [name] Other Clinician: Referring Tyanna Hach: Treating Legaci Tarman/Extender: Corliss Marcus Weeks in Treatment: 2 Wound Status Wound Number: [id number] Primary Venous Leg Ulcer Etiology: Wound Location: Left, Anterior Lower Leg Wound Open Wounding Event: Blister Status: Date Acquired: 01/27/2022 Comorbid Cataracts, Glaucoma, Chronic Obstructive Pulmonary Disease Weeks Of Treatment: 2 History: (COPD), Arrhythmia, Congestive Heart Failure, Coronary Artery Clustered Wound: No Disease, Hypertension, Myocardial Infarction, Type II Diabetes, Gout, Neuropathy Photos Wound Measurements Length: (cm) Width: (cm) Depth: (cm) Area: (cm) Volume: (cm) 0 % Reduction in Area: 100% 0 % Reduction in Volume: 100% 0 Epithelialization: Small (1-33%) 0 0 Wound Description Classification: Full Thickness Without Exposed Support Structures Wound Margin: Distinct, outline attached  Exudate Amount: Medium Exudate Type: Serosanguineous Exudate Color: red, brown Foul Odor After Cleansing: No Slough/Fibrino Yes Wound Bed Granulation Amount: Large (67-100%) Exposed Structure Granulation Quality: Red Fascia Exposed: No Necrotic Amount: Small (1-33%) Fat Layer (Subcutaneous Tissue) Exposed: Yes Necrotic Quality: Adherent Slough Tendon Exposed: No Muscle Exposed:  No Joint Exposed: No Bone Exposed: No Electronic Signature(s) Signed: 05/05/2022 4:30:53 [name] By: Sandre Kitty Entered By: Sandre Kitty on 05/04/2022 15:30:04 -------------------------------------------------------------------------------- Wound Assessment Details Patient Name: Date of Service: Vincent [name] ID M. 05/04/2022 3:45 [name] Medical Record Number: [id number] Patient Account Number: [id number] Date of Birth/Sex: Treating RN: 28-May-1936 (86 y.o. M) Primary Care Jimmie Rueter: [name] Other Clinician: Referring Itamar Mcgowan: Treating Clearence Vitug/Extender: Celine Ahr in Treatment: 2 Wound Status Wound Number: 25 Primary Venous Leg Ulcer Etiology: Wound Location: Right, Medial Lower Leg Wound Open Wounding Event: Blister Status: Date Acquired: 01/27/2022 Comorbid Cataracts, Glaucoma, Chronic Obstructive Pulmonary Disease Weeks Of Treatment: 2 History: (COPD), Arrhythmia, Congestive Heart Failure, Coronary Artery Clustered Wound: No Disease, Hypertension, Myocardial Infarction, Type II Diabetes, Gout, Neuropathy Photos Wound Measurements Length: (cm) Width: (cm) Depth: (cm) Area: (cm) Volume: (cm) 0 % Reduction in Area: 100% 0 % Reduction in Volume: 100% 0 Epithelialization: Large (67-100%) 0 0 Wound Description Classification: Full Thickness Without Exposed Support Structures Wound Margin: Distinct, outline attached Exudate Amount: Medium Exudate Type: Serosanguineous Exudate Color: red, brown Foul Odor After Cleansing: No Slough/Fibrino Yes Wound Bed Granulation Amount: Large (67-100%) Exposed Structure Granulation Quality: Red Fascia Exposed: No Necrotic Amount: Small (1-33%) Fat Layer (Subcutaneous Tissue) Exposed: Yes Necrotic Quality: Adherent Slough Tendon Exposed: No Muscle Exposed: No Joint Exposed: No Bone Exposed: No Electronic Signature(s) Signed: 05/05/2022 4:30:53 [name] By: Sandre Kitty Entered By: Sandre Kitty  on 05/04/2022 15:30:29 -------------------------------------------------------------------------------- Vitals Details Patient Name: Date of Service: Vincent [name], Vincent V ID M. 05/04/2022 3:45 [name] Medical Record Number: [id number] Patient Account Number: [id number] Date of Birth/Sex: Treating RN: [birth year] (86 y.o. M) Primary Care Tanessa Tidd: [name] Other Clinician: Referring Adyson Vanburen: Treating Christphor Groft/Extender: Celine Ahr in Treatment: 2 Vital Signs Time Taken: 14:22 Temperature (F): 97.7 Height (in): 69 Pulse (bpm): 48 Weight (lbs): 175 Respiratory Rate (breaths/min): 18 Body Mass Index (BMI): 25.8 Blood Pressure (mmHg): 157/72 Capillary Blood Glucose (mg/dl): 100 Reference Range: 80 - 120 mg / dl Electronic Signature(s) Signed: 05/05/2022 4:30:53 [name] By: Sandre Kitty Entered By: Sandre Kitty on 05/04/2022 15:23:04

## 2022-05-16 ENCOUNTER — Other Ambulatory Visit (HOSPITAL_COMMUNITY): Payer: Self-pay

## 2022-05-16 MED ORDER — APIXABAN 5 MG PO TABS
5.0000 mg | ORAL_TABLET | Freq: Two times a day (BID) | ORAL | 5 refills | Status: AC
Start: 2022-05-16 — End: ?

## 2022-05-17 DIAGNOSIS — H25812 Combined forms of age-related cataract, left eye: Secondary | ICD-10-CM | POA: Diagnosis not present

## 2022-05-17 DIAGNOSIS — H401133 Primary open-angle glaucoma, bilateral, severe stage: Secondary | ICD-10-CM | POA: Diagnosis not present

## 2022-05-17 DIAGNOSIS — E119 Type 2 diabetes mellitus without complications: Secondary | ICD-10-CM | POA: Diagnosis not present

## 2022-05-17 DIAGNOSIS — H35033 Hypertensive retinopathy, bilateral: Secondary | ICD-10-CM | POA: Diagnosis not present

## 2022-05-24 ENCOUNTER — Encounter (HOSPITAL_BASED_OUTPATIENT_CLINIC_OR_DEPARTMENT_OTHER): Payer: Medicare Other | Admitting: General Surgery

## 2022-05-24 DIAGNOSIS — Z7901 Long term (current) use of anticoagulants: Secondary | ICD-10-CM | POA: Diagnosis not present

## 2022-05-24 DIAGNOSIS — I872 Venous insufficiency (chronic) (peripheral): Secondary | ICD-10-CM | POA: Diagnosis not present

## 2022-05-24 DIAGNOSIS — L97821 Non-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 leg limited to breakdown of skin: Secondary | ICD-10-CM | POA: Diagnosis not present

## 2022-05-24 DIAGNOSIS — I4819 Other persistent atrial fibrillation: Secondary | ICD-10-CM | POA: Diagnosis not present

## 2022-05-24 DIAGNOSIS — E11622 Type 2 diabetes mellitus with other skin ulcer: Secondary | ICD-10-CM | POA: Diagnosis not present

## 2022-05-24 DIAGNOSIS — L97811 Non-pressure chronic ulcer of other part of right lower leg limited to breakdown of skin: Secondary | ICD-10-CM | POA: Diagnosis not present

## 2022-05-24 DIAGNOSIS — I110 Hypertensive heart disease with heart failure: Secondary | ICD-10-CM | POA: Diagnosis not present

## 2022-05-24 DIAGNOSIS — I509 Heart failure, unspecified: Secondary | ICD-10-CM | POA: Diagnosis not present

## 2022-05-24 DIAGNOSIS — Z8673 Personal history of transient ischemic attack (TIA), and cerebral infarction without residual deficits: Secondary | ICD-10-CM | POA: Diagnosis not present

## 2022-05-24 DIAGNOSIS — I2510 Atherosclerotic heart disease of native coronary artery without angina pectoris: Secondary | ICD-10-CM | POA: Diagnosis not present

## 2022-05-24 NOTE — Progress Notes (Signed)
Balding, Lex M. ([id number]) Visit Report for 05/24/2022 Arrival Information Details Patient Name: Date of Service: [name] ID M. 05/24/2022 12:30 PM Medical Record Number: [id number] Patient Account Number: [id number] Date of Birth/Sex: Treating RN: [birth year] (86 y.o. Vincent [name] Primary Care Tiburcio Linder: [name] Other Clinician: Referring Vali Capano: Treating Valeta Paz/Extender: Celine Ahr in Treatment: 5 Visit Information History Since Last Visit Added or deleted any medications: No Patient Arrived: Ambulatory Any new allergies or adverse reactions: No Arrival Time: 13:09 Had a fall or experienced change in No Accompanied By: self activities of daily living that may affect Transfer Assistance: None risk of falls: Patient Identification Verified: Yes Signs or symptoms of abuse/neglect since last visito No Secondary Verification Process Completed: Yes Hospitalized since last visit: No Patient Requires Transmission-Based Precautions: No Implantable device outside of the clinic excluding No Patient Has Alerts: Yes cellular tissue based products placed in the center Patient Alerts: Patient on Blood Thinner since last visit: Has Dressing in Place as Prescribed: No Has Compression in Place as Prescribed: No Pain Present Now: Yes Electronic Signature(s) Signed: 05/24/2022 5:30:40 PM By: Baruch Gouty RN, BSN Entered By: Baruch Gouty on 05/24/2022 13:12:17 -------------------------------------------------------------------------------- Clinic Level of Care Assessment Details Patient Name: Date of Service: [name], [name] 05/24/2022 12:30 PM Medical Record Number: [id number] Patient Account Number: [id number] Date of Birth/Sex: Treating RN: 19-Apr-1936 (86 y.o. Vincent [name] Primary Care Amnah Breuer: [name] Other Clinician: Referring Sereen Schaff: Treating Nahdia Doucet/Extender: Celine Ahr in Treatment:  5 Clinic Level of Care Assessment Items TOOL 4 Quantity Score '[]'$  - 0 Use when only an EandM is performed on FOLLOW-UP visit ASSESSMENTS - Nursing Assessment / Reassessment X- 1 10 Reassessment of Co-morbidities (includes updates in patient status) X- 1 5 Reassessment of Adherence to Treatment Plan ASSESSMENTS - Wound and Skin A ssessment / Reassessment '[]'$  - 0 Simple Wound Assessment / Reassessment - one wound X- 4 5 Complex Wound Assessment / Reassessment - multiple wounds '[]'$  - 0 Dermatologic / Skin Assessment (not related to wound area) ASSESSMENTS - Focused Assessment X- 2 5 Circumferential Edema Measurements - multi extremities '[]'$  - 0 Nutritional Assessment / Counseling / Intervention X- 1 5 Lower Extremity Assessment (monofilament, tuning fork, pulses) '[]'$  - 0 Peripheral Arterial Disease Assessment (using hand held doppler) ASSESSMENTS - Ostomy and/or Continence Assessment and Care '[]'$  - 0 Incontinence Assessment and Management '[]'$  - 0 Ostomy Care Assessment and Management (repouching, etc.) PROCESS - Coordination of Care X - Simple Patient / Family Education for ongoing care 1 15 '[]'$  - 0 Complex (extensive) Patient / Family Education for ongoing care X- 1 10 Staff obtains Programmer, systems, Records, T Results / Process Orders est '[]'$  - 0 Staff telephones HHA, Nursing Homes / Clarify orders / etc '[]'$  - 0 Routine Transfer to another Facility (non-emergent condition) '[]'$  - 0 Routine Hospital Admission (non-emergent condition) '[]'$  - 0 New Admissions / Biomedical engineer / Ordering NPWT Apligraf, etc. , '[]'$  - 0 Emergency Hospital Admission (emergent condition) X- 1 10 Simple Discharge Coordination '[]'$  - 0 Complex (extensive) Discharge Coordination PROCESS - Special Needs '[]'$  - 0 Pediatric / Minor Patient Management '[]'$  - 0 Isolation Patient Management '[]'$  - 0 Hearing / Language / Visual special needs '[]'$  - 0 Assessment of Community assistance (transportation, D/C planning,  etc.) '[]'$  - 0 Additional assistance / Altered mentation '[]'$  - 0 Support Surface(s) Assessment (bed, cushion, seat, etc.) INTERVENTIONS - Wound Cleansing / Measurement '[]'$  - 0  Simple Wound Cleansing - one wound X- 4 5 Complex Wound Cleansing - multiple wounds X- 1 5 Wound Imaging (photographs - any number of wounds) '[]'$  - 0 Wound Tracing (instead of photographs) '[]'$  - 0 Simple Wound Measurement - one wound X- 4 5 Complex Wound Measurement - multiple wounds INTERVENTIONS - Wound Dressings '[]'$  - 0 Small Wound Dressing one or multiple wounds X- 2 15 Medium Wound Dressing one or multiple wounds '[]'$  - 0 Large Wound Dressing one or multiple wounds '[]'$  - 0 Application of Medications - topical '[]'$  - 0 Application of Medications - injection INTERVENTIONS - Miscellaneous '[]'$  - 0 External ear exam '[]'$  - 0 Specimen Collection (cultures, biopsies, blood, body fluids, etc.) '[]'$  - 0 Specimen(s) / Culture(s) sent or taken to Lab for analysis '[]'$  - 0 Patient Transfer (multiple staff / Civil Service fast streamer / Similar devices) '[]'$  - 0 Simple Staple / Suture removal (25 or less) '[]'$  - 0 Complex Staple / Suture removal (26 or more) '[]'$  - 0 Hypo / Hyperglycemic Management (close monitor of Blood Glucose) '[]'$  - 0 Ankle / Brachial Index (ABI) - do not check if billed separately X- 1 5 Vital Signs Has the patient been seen at the hospital within the last three years: Yes Total Score: 165 Level Of Care: New/Established - Level 5 Electronic Signature(s) Signed: 05/24/2022 5:30:40 PM By: Baruch Gouty RN, BSN Entered By: Baruch Gouty on 05/24/2022 16:25:47 -------------------------------------------------------------------------------- Encounter Discharge Information Details Patient Name: Date of Service: [name], [name] 05/24/2022 12:30 PM Medical Record Number: [id number] Patient Account Number: [id number] Date of Birth/Sex: Treating RN: 03/07/1936 (86 y.o. Vincent [name] Primary Care Emiya Loomer: [name] Other Clinician: Referring Kee Drudge: Treating Yahye Siebert/Extender: Celine Ahr in Treatment: 5 Encounter Discharge Information Items Post Procedure Vitals Discharge Condition: Stable Temperature (F): 98.6 Ambulatory Status: Ambulatory Pulse (bpm): 97 Discharge Destination: Home Respiratory Rate (breaths/min): 18 Transportation: Private Auto Blood Pressure (mmHg): 148/86 Accompanied By: self Schedule Follow-up Appointment: Yes Clinical Summary of Care: Patient Declined Electronic Signature(s) Signed: 05/24/2022 5:30:40 PM By: Baruch Gouty RN, BSN Entered By: Baruch Gouty on 05/24/2022 14:17:15 -------------------------------------------------------------------------------- Lower Extremity Assessment Details Patient Name: Date of Service: [name], [name] 05/24/2022 12:30 PM Medical Record Number: [id number] Patient Account Number: [id number] Date of Birth/Sex: Treating RN: 08-19-1936 (86 y.o. Vincent [name] Primary Care Janes Colegrove: [name] Other Clinician: Referring Tsugio Elison: Treating Jex Strausbaugh/Extender: Celine Ahr in Treatment: 5 Edema Assessment Assessed: [Left: No] [Right: No] Edema: [Left: Yes] [Right: Yes] Calf Left: Right: Point of Measurement: From Medial Instep 38 cm 41 cm Ankle Left: Right: Point of Measurement: From Medial Instep 25.2 cm 25.5 cm Vascular Assessment Pulses: Dorsalis Pedis Palpable: [Left:Yes] [Right:Yes] Electronic Signature(s) Signed: 05/24/2022 5:30:40 PM By: Baruch Gouty RN, BSN Entered By: Baruch Gouty on 05/24/2022 13:16:18 -------------------------------------------------------------------------------- Multi Wound Chart Details Patient Name: Date of Service: [name], [name] 05/24/2022 12:30 PM Medical Record Number: [id number] Patient Account Number: [id number] Date of Birth/Sex: Treating RN: Dec 28, 1935 (86 y.o. 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: Diabetic Wound/Ulcer  of the Lower Diabetic Wound/Ulcer of the Lower Diabetic Wound/Ulcer of the Lower Secondary Etiology: Extremity Extremity Extremity Cataracts, Glaucoma, Chronic Cataracts, Glaucoma, Chronic Cataracts, Glaucoma, Chronic Comorbid History: Obstructive Pulmonary Disease Obstructive Pulmonary Disease Obstructive Pulmonary Disease (COPD), Arrhythmia, Congestive (COPD), Arrhythmia, Congestive (COPD), Arrhythmia, Congestive Heart Failure, Coronary Artery Heart Failure, Coronary Artery Heart Failure, Coronary Artery Disease, Hypertension, Myocardial Disease, Hypertension, Myocardial Disease, Hypertension, Myocardial Infarction, Type II Diabetes, Gout, Infarction, Type II Diabetes, Gout, Infarction, Type II Diabetes, Gout, Neuropathy Neuropathy Neuropathy 05/09/2022 05/09/2022 05/09/2022 Date Acquired: 0 0 0 Weeks of Treatment: Open Open Open Wound Status: No No No Wound Recurrence: No No Yes Clustered Wound: N/A N/A 2 Clustered Quantity: 15.5x35.2x0.1 3.5x5.3x0.1 8.3x5.2x0.1 Measurements L x W x D (cm) 428.513 14.569 33.898 A (cm) : rea 42.851 1.457 3.39 Volume (cm) : Full Thickness Without Exposed Full Thickness Without Exposed Full Thickness Without Exposed Classification: Support Structures Support Structures Support Structures Large Medium Medium Exudate Amount: Serous Serous Serous Exudate Type: Proofreader Exudate Color: Indistinct, nonvisible Flat and Intact Flat and Intact Wound Margin: Large (67-100%) Large (67-100%) Large (67-100%) Granulation Amount: Red Red Red Granulation Quality: Small (1-33%) Small (1-33%) Small (1-33%) Necrotic Amount: Fat Layer (Subcutaneous Tissue): Yes Fat Layer (Subcutaneous Tissue): Yes Fat Layer (Subcutaneous Tissue): Yes Exposed Structures: Fascia: No Fascia: No Fascia: No Tendon: No Tendon: No Tendon: No Muscle: No Muscle: No Muscle: No Joint: No Joint: No Joint: No Bone: No Bone: No Bone: No Medium (34-66%) Small (1-33%) Small (1-33%) Epithelialization: Debridement - Selective/Open Wound Debridement - Selective/Open Wound Debridement - Selective/Open Wound Debridement: Pre-procedure Verification/Time Out 13:45 13:45 13:45 Taken: Lidocaine 4% Topical Solution Lidocaine 4% Topical Solution Lidocaine 4% Topical Solution Pain Control: [employer] Tissue Debrided: Non-Viable Tissue Non-Viable Tissue Non-Viable Tissue Level: 160 18.55 30 Debridement A (sq cm): rea Curette Curette Curette Instrument: Minimum Minimum Minimum Bleeding: Pressure Pressure Pressure Hemostasis A chieved: '4 4 4 '$ Procedural Pain: '3 3 3 '$ Post Procedural Pain: Procedure was tolerated well Procedure was tolerated well Procedure was tolerated well Debridement Treatment Response: 15.5x35.2x0.1 3.5x5.3x0.1 8.3x5.2x0.1 Post Debridement Measurements L x W x D (cm) 42.851 1.457 3.39 Post Debridement Volume: (cm) Debridement Debridement Debridement Procedures Performed: Wound Number: 17 N/A N/A Photos: N/A N/A Left, Medial Lower Leg N/A N/A Wound Location: Blister N/A N/A Wounding Event: Venous Leg Ulcer N/A N/A Primary Etiology: Diabetic Wound/Ulcer of the Lower N/A N/A Secondary Etiology: Extremity Cataracts, Glaucoma, Chronic N/A N/A Comorbid History: Obstructive Pulmonary Disease (COPD), Arrhythmia, Congestive Heart Failure,  Coronary Artery Disease, Hypertension, Myocardial Infarction, Type II Diabetes, Gout, Neuropathy 05/09/2022 N/A N/A Date Acquired: 0 N/A N/A Weeks of Treatment: Open N/A N/A Wound Status: No N/A N/A Wound Recurrence: Yes N/A N/A Clustered Wound: 6 N/A N/A Clustered Quantity: 4.5x5.4x0.1 N/A N/A Measurements L x W x D (cm) 19.085 N/A N/A A (cm) : rea 1.909 N/A N/A Volume (cm) : Full Thickness Without Exposed N/A N/A Classification: Support Structures Medium N/A N/A Exudate A mount: Serous N/A N/A Exudate Type: amber N/A N/A Exudate Color: Flat and Intact N/A N/A Wound Margin: Large (67-100%) N/A N/A Granulation A mount: Red N/A N/A Granulation Quality: Small (1-33%) N/A N/A Necrotic A mount: Fat Layer (Subcutaneous Tissue): Yes N/A N/A Exposed Structures: Fascia: No Tendon: No Muscle: No Joint: No Bone: No Small (1-33%) N/A N/A Epithelialization: Debridement - Selective/Open Wound N/A N/A Debridement: Pre-procedure Verification/Time Out 13:45 N/A N/A Taken: Lidocaine 4% Topical Solution N/A N/A Pain Control: Slough N/A N/A Tissue Debrided: Non-Viable  Tissue N/A N/A Level: 18 N/A N/A Debridement A (sq cm): rea Curette N/A N/A Instrument: Minimum N/A N/A Bleeding: Pressure N/A N/A Hemostasis A chieved: 4 N/A N/A Procedural Pain: 3 N/A N/A Post Procedural Pain: Procedure was tolerated well N/A N/A Debridement Treatment Response: 4.5x4x0.1 N/A N/A Post Debridement Measurements L x W x D (cm) 1.414 N/A N/A Post Debridement Volume: (cm) Debridement N/A N/A Procedures Performed: Treatment Notes Wound #14 (Lower Leg) Wound Laterality: Right, Circumferential Cleanser Peri-Wound Care Sween Lotion (Moisturizing lotion) Discharge Instruction: Apply moisturizing lotion as directed Topical Primary Dressing KerraCel Ag Gelling Fiber Dressing, 4x5 in (silver alginate) Discharge Instruction: Apply silver alginate to wound bed as  instructed Secondary Dressing ABD Pad, 8x10 Discharge Instruction: Apply over primary dressing as directed. Zetuvit Plus 4x8 in Discharge Instruction: Apply over primary dressing as directed. Secured With Compression Wrap Kerlix Roll 4.5x3.1 (in/yd) Discharge Instruction: Apply Kerlix and Coban compression as directed. Coban Self-Adherent Wrap 4x5 (in/yd) Discharge Instruction: Apply over Kerlix as directed. Compression Stockings Add-Ons Wound #15 (Lower Leg) Wound Laterality: Left, Anterior Cleanser Peri-Wound Care Sween Lotion (Moisturizing lotion) Discharge Instruction: Apply moisturizing lotion as directed Topical Primary Dressing KerraCel Ag Gelling Fiber Dressing, 4x5 in (silver alginate) Discharge Instruction: Apply silver alginate to wound bed as instructed Secondary Dressing ABD Pad, 8x10 Discharge Instruction: Apply over primary dressing as directed. Zetuvit Plus 4x8 in Discharge Instruction: Apply over primary dressing as directed. Secured With Compression Wrap Kerlix Roll 4.5x3.1 (in/yd) Discharge Instruction: Apply Kerlix and Coban compression as directed. Coban Self-Adherent Wrap 4x5 (in/yd) Discharge Instruction: Apply over Kerlix as directed. Compression Stockings Add-Ons Wound #16 (Lower Leg) Wound Laterality: Left, Lateral Cleanser Peri-Wound Care Sween Lotion (Moisturizing lotion) Discharge Instruction: Apply moisturizing lotion as directed Topical Primary Dressing KerraCel Ag Gelling Fiber Dressing, 4x5 in (silver alginate) Discharge Instruction: Apply silver alginate to wound bed as instructed Secondary Dressing ABD Pad, 8x10 Discharge Instruction: Apply over primary dressing as directed. Zetuvit Plus 4x8 in Discharge Instruction: Apply over primary dressing as directed. Secured With Compression Wrap Kerlix Roll 4.5x3.1 (in/yd) Discharge Instruction: Apply Kerlix and Coban compression as directed. Coban Self-Adherent Wrap 4x5  (in/yd) Discharge Instruction: Apply over Kerlix as directed. Compression Stockings Add-Ons Wound #17 (Lower Leg) Wound Laterality: Left, Medial Cleanser Peri-Wound Care Sween Lotion (Moisturizing lotion) Discharge Instruction: Apply moisturizing lotion as directed Topical Primary Dressing KerraCel Ag Gelling Fiber Dressing, 4x5 in (silver alginate) Discharge Instruction: Apply silver alginate to wound bed as instructed Secondary Dressing ABD Pad, 8x10 Discharge Instruction: Apply over primary dressing as directed. Zetuvit Plus 4x8 in Discharge Instruction: Apply over primary dressing as directed. Secured With Compression Wrap Kerlix Roll 4.5x3.1 (in/yd) Discharge Instruction: Apply Kerlix and Coban compression as directed. Coban Self-Adherent Wrap 4x5 (in/yd) Discharge Instruction: Apply over Kerlix as directed. Compression Stockings Add-Ons Electronic Signature(s) Signed: 05/24/2022 2:17:30 PM By: Fredirick Maudlin MD FACS Entered By: Fredirick Maudlin on 05/24/2022 14:17:30 -------------------------------------------------------------------------------- Multi-Disciplinary Care Plan Details Patient Name: Date of Service: [name], [name] 05/24/2022 12:30 PM Medical Record Number: [id number] Patient Account Number: [id number] Date of Birth/Sex: Treating RN: June 22, 1936 (86 y.o. Vincent [name] Primary Care Cerys Winget: [name] Other Clinician: Referring Tafari Humiston: Treating Shelby Peltz/Extender: Celine Ahr in Treatment: 5 Multidisciplinary Care Plan reviewed with physician Active Inactive Venous Leg Ulcer Nursing Diagnoses: Knowledge deficit related to disease process and management Potential for venous Insuffiency (use before diagnosis confirmed) Goals: Patient will maintain optimal edema control Date Initiated: 05/24/2022 Target Resolution Date: 06/21/2022 Goal Status: Active Interventions:  Assess peripheral edema status every  visit. Compression as ordered Provide education on venous insufficiency Treatment Activities: Therapeutic compression applied : 05/24/2022 Notes: Wound/Skin Impairment Nursing Diagnoses: Impaired tissue integrity Knowledge deficit related to ulceration/compromised skin integrity Goals: Patient/caregiver will verbalize understanding of skin care regimen Date Initiated: 05/24/2022 Target Resolution Date: 06/21/2022 Goal Status: Active Ulcer/skin breakdown will have a volume reduction of 30% by week 4 Date Initiated: 05/24/2022 Target Resolution Date: 06/21/2022 Goal Status: Active Interventions: Assess ulceration(s) every visit Treatment Activities: Skin care regimen initiated : 04/15/2022 Topical wound management initiated : 04/15/2022 Notes: Electronic Signature(s) Signed: 05/24/2022 5:30:40 PM By: Baruch Gouty RN, BSN Entered By: Baruch Gouty on 05/24/2022 13:42:30 -------------------------------------------------------------------------------- Pain Assessment Details Patient Name: Date of Service: [name], [name] 05/24/2022 12:30 PM Medical Record Number: [id number] Patient Account Number: [id number] Date of Birth/Sex: Treating RN: 1936-07-27 (86 y.o. Vincent [name] Primary Care Prestin Munch: [name] Other Clinician: Referring Braydon Kullman: Treating Isam Unrein/Extender: Celine Ahr in Treatment: 5 Active Problems Location of Pain Severity and Description of Pain Patient Has Paino Yes Site Locations Pain Location: Pain in Ulcers With Dressing Change: Yes Duration of the Pain. Constant / Intermittento Intermittent Rate the pain. Current Pain Level: 7 Least Pain Level: 0 Character of Pain Describe the Pain: Aching Pain Management and Medication Current Pain Management: Medication: Yes Is the Current Pain Management Adequate: Adequate How does your wound impact your activities of daily livingo Sleep: No Bathing: No Appetite:  No Relationship With Others: No Bladder Continence: No Emotions: No Bowel Continence: No Work: No Toileting: No Drive: No Dressing: No Hobbies: No Engineer, maintenance) Signed: 05/24/2022 5:30:40 PM By: Baruch Gouty RN, BSN Entered By: Baruch Gouty on 05/24/2022 13:13:07 -------------------------------------------------------------------------------- Patient/Caregiver Education Details Patient Name: Date of Service: [name] ID M. 9/26/2023andnbsp12:30 PM Medical Record Number: [id number] Patient Account Number: [id number] Date of Birth/Gender: Treating RN: 02-10-1936 (86 y.o. Vincent [name] Primary Care Physician: [name] Other Clinician: Referring Physician: Treating Physician/Extender: Celine Ahr in Treatment: 5 Education Assessment Education Provided To: Patient Education Topics Provided Venous: Methods: Explain/Verbal Responses: Reinforcements needed, State content correctly Electronic Signature(s) Signed: 05/24/2022 5:30:40 PM By: Baruch Gouty RN, BSN Entered By: Baruch Gouty on 05/24/2022 13:42:43 -------------------------------------------------------------------------------- Wound Assessment Details Patient Name: Date of Service: [name], Shaune Pascal ID M. 05/24/2022 12:30 PM Medical Record Number: [id number] Patient Account Number: [id number] Date of Birth/Sex: Treating RN: 11/20/1935 (86 y.o. Vincent [name] Primary Care Cuinn Westerhold: [name] Other Clinician: Referring Adisynn Suleiman: Treating Vesna Kable/Extender: Celine Ahr in Treatment: 5 Wound Status Wound Number: 14 Primary Venous Leg Ulcer Etiology: Wound Location: Right, Circumferential Lower Leg Secondary Diabetic Wound/Ulcer of the Lower Extremity Wounding Event: Blister Etiology: Date Acquired: 05/09/2022 Wound Open Weeks Of Treatment: 0 Status: Clustered Wound: No Comorbid Cataracts, Glaucoma, Chronic Obstructive  Pulmonary Disease History: (COPD), Arrhythmia, Congestive Heart Failure, Coronary Artery Disease, Hypertension, Myocardial Infarction, Type II Diabetes, Gout, Neuropathy Photos Wound Measurements Length: (cm) 15.5 Width: (cm) 35.2 Depth: (cm) 0.1 Area: (cm) 428.513 Volume: (cm) 42.851 % Reduction in Area: % Reduction in Volume: Epithelialization: Medium (34-66%) Tunneling: No Undermining: No Wound Description Classification: Full Thickness Without Exposed Support Structures Wound Margin: Indistinct, nonvisible Exudate Amount: Large Exudate Type: Serous Exudate Color: amber Foul Odor After Cleansing: No Slough/Fibrino Yes Wound Bed Granulation Amount: Large (67-100%) Exposed Structure Granulation Quality: Red Fascia Exposed: No Necrotic Amount: Small (1-33%) Fat Layer (Subcutaneous Tissue) Exposed: Yes Necrotic Quality: Adherent Slough Tendon Exposed:  No Muscle Exposed: No Joint Exposed: No Bone Exposed: No Treatment Notes Wound #14 (Lower Leg) Wound Laterality: Right, Circumferential Cleanser Peri-Wound Care Sween Lotion (Moisturizing lotion) Discharge Instruction: Apply moisturizing lotion as directed Topical Primary Dressing KerraCel Ag Gelling Fiber Dressing, 4x5 in (silver alginate) Discharge Instruction: Apply silver alginate to wound bed as instructed Secondary Dressing ABD Pad, 8x10 Discharge Instruction: Apply over primary dressing as directed. Zetuvit Plus 4x8 in Discharge Instruction: Apply over primary dressing as directed. Secured With Compression Wrap Kerlix Roll 4.5x3.1 (in/yd) Discharge Instruction: Apply Kerlix and Coban compression as directed. Coban Self-Adherent Wrap 4x5 (in/yd) Discharge Instruction: Apply over Kerlix as directed. Compression Stockings Add-Ons Electronic Signature(s) Signed: 05/24/2022 5:30:40 PM By: Baruch Gouty RN, BSN Entered By: Baruch Gouty on 05/24/2022  13:36:29 -------------------------------------------------------------------------------- Wound Assessment Details Patient Name: Date of Service: [name], [name] 05/24/2022 12:30 PM Medical Record Number: [id number] Patient Account Number: [id number] Date of Birth/Sex: Treating RN: 11/24/1935 (86 y.o. Vincent [name] Primary Care Tala Eber: [name] Other Clinician: Referring Kaelem Brach: Treating Raylinn Kosar/Extender: Celine Ahr in Treatment: 5 Wound Status Wound Number: 15 Primary Venous Leg Ulcer Etiology: Wound Location: Left, Anterior Lower Leg Secondary Diabetic Wound/Ulcer of the Lower Extremity Wounding Event: Blister Etiology: Date Acquired: 05/09/2022 Wound Open Weeks Of Treatment: 0 Status: Clustered Wound: No Comorbid Cataracts, Glaucoma, Chronic Obstructive Pulmonary Disease History: (COPD), Arrhythmia, Congestive Heart Failure, Coronary Artery Disease, Hypertension, Myocardial Infarction, Type II Diabetes, Gout, Neuropathy Photos Wound Measurements Length: (cm) 3.5 Width: (cm) 5.3 Depth: (cm) 0.1 Area: (cm) 14.569 Volume: (cm) 1.457 % Reduction in Area: % Reduction in Volume: Epithelialization: Small (1-33%) Tunneling: No Undermining: No Wound Description Classification: Full Thickness Without Exposed Support Structures Wound Margin: Flat and Intact Exudate Amount: Medium Exudate Type: Serous Exudate Color: amber Foul Odor After Cleansing: No Slough/Fibrino Yes Wound Bed Granulation Amount: Large (67-100%) Exposed Structure Granulation Quality: Red Fascia Exposed: No Necrotic Amount: Small (1-33%) Fat Layer (Subcutaneous Tissue) Exposed: Yes Necrotic Quality: Adherent Slough Tendon Exposed: No Muscle Exposed: No Joint Exposed: No Bone Exposed: No Treatment Notes Wound #15 (Lower Leg) Wound Laterality: Left, Anterior Cleanser Peri-Wound Care Sween Lotion (Moisturizing lotion) Discharge Instruction: Apply  moisturizing lotion as directed Topical Primary Dressing KerraCel Ag Gelling Fiber Dressing, 4x5 in (silver alginate) Discharge Instruction: Apply silver alginate to wound bed as instructed Secondary Dressing ABD Pad, 8x10 Discharge Instruction: Apply over primary dressing as directed. Zetuvit Plus 4x8 in Discharge Instruction: Apply over primary dressing as directed. Secured With Compression Wrap Kerlix Roll 4.5x3.1 (in/yd) Discharge Instruction: Apply Kerlix and Coban compression as directed. Coban Self-Adherent Wrap 4x5 (in/yd) Discharge Instruction: Apply over Kerlix as directed. Compression Stockings Add-Ons Electronic Signature(s) Signed: 05/24/2022 5:30:40 PM By: Baruch Gouty RN, BSN Entered By: Baruch Gouty on 05/24/2022 13:36:50 -------------------------------------------------------------------------------- Wound Assessment Details Patient Name: Date of Service: [name], [name] 05/24/2022 12:30 PM Medical Record Number: [id number] Patient Account Number: [id number] Date of Birth/Sex: Treating RN: 1936/01/13 (86 y.o. Vincent [name] Primary Care Pasha Broad: [name] Other Clinician: Referring Lorenda Grecco: Treating Raul Torrance/Extender: Corliss Marcus Weeks in Treatment: 5 Wound Status Wound Number: 16 Primary Venous Leg Ulcer Etiology: Wound Location: Left, Lateral Lower Leg Secondary Diabetic Wound/Ulcer of the Lower Extremity Wounding Event: Blister Etiology: Date Acquired: 05/09/2022 Wound Open Weeks Of Treatment: 0 Status: Clustered Wound: Yes Comorbid Cataracts, Glaucoma, Chronic Obstructive Pulmonary Disease History: (COPD), Arrhythmia, Congestive Heart Failure, Coronary Artery Disease, Hypertension, Myocardial Infarction, Type II Diabetes, Gout, Neuropathy Photos Wound Measurements  Length: (cm) 8.3 Width: (cm) 5.2 Depth: (cm) 0.1 Clustered Quantity: 2 Area: (cm) 33.898 Volume: (cm) 3.39 % Reduction in Area: %  Reduction in Volume: Epithelialization: Small (1-33%) Tunneling: No Undermining: No Wound Description Classification: Full Thickness Without Exposed Support Structures Wound Margin: Flat and Intact Exudate Amount: Medium Exudate Type: Serous Exudate Color: amber Foul Odor After Cleansing: No Slough/Fibrino Yes Wound Bed Granulation Amount: Large (67-100%) Exposed Structure Granulation Quality: Red Fascia Exposed: No Necrotic Amount: Small (1-33%) Fat Layer (Subcutaneous Tissue) Exposed: Yes Necrotic Quality: Adherent Slough Tendon Exposed: No Muscle Exposed: No Joint Exposed: No Bone Exposed: No Treatment Notes Wound #16 (Lower Leg) Wound Laterality: Left, Lateral Cleanser Peri-Wound Care Sween Lotion (Moisturizing lotion) Discharge Instruction: Apply moisturizing lotion as directed Topical Primary Dressing KerraCel Ag Gelling Fiber Dressing, 4x5 in (silver alginate) Discharge Instruction: Apply silver alginate to wound bed as instructed Secondary Dressing ABD Pad, 8x10 Discharge Instruction: Apply over primary dressing as directed. Zetuvit Plus 4x8 in Discharge Instruction: Apply over primary dressing as directed. Secured With Compression Wrap Kerlix Roll 4.5x3.1 (in/yd) Discharge Instruction: Apply Kerlix and Coban compression as directed. Coban Self-Adherent Wrap 4x5 (in/yd) Discharge Instruction: Apply over Kerlix as directed. Compression Stockings Add-Ons Electronic Signature(s) Signed: 05/24/2022 5:30:40 PM By: Baruch Gouty RN, BSN Entered By: Baruch Gouty on 05/24/2022 13:37:32 -------------------------------------------------------------------------------- Wound Assessment Details Patient Name: Date of Service: [name], [name] 05/24/2022 12:30 PM Medical Record Number: [id number] Patient Account Number: [id number] Date of Birth/Sex: Treating RN: [birth year] (86 y.o. Vincent [name] Primary Care Sulo Janczak: [name] Other  Clinician: Referring Broly Hatfield: Treating Braydon Kullman/Extender: Celine Ahr in Treatment: 5 Wound Status Wound Number: 17 Primary Venous Leg Ulcer Etiology: Wound Location: Left, Medial Lower Leg Secondary Diabetic Wound/Ulcer of the Lower Extremity Wounding Event: Blister Etiology: Date Acquired: 05/09/2022 Wound Open Weeks Of Treatment: 0 Status: Clustered Wound: Yes Comorbid Cataracts, Glaucoma, Chronic Obstructive Pulmonary Disease History: (COPD), Arrhythmia, Congestive Heart Failure, Coronary Artery Disease, Hypertension, Myocardial Infarction, Type II Diabetes, Gout, Neuropathy Photos Wound Measurements Length: (cm) 4.5 Width: (cm) 5.4 Depth: (cm) 0.1 Clustered Quantity: 6 Area: (cm) 19.085 Volume: (cm) 1.909 % Reduction in Area: % Reduction in Volume: Epithelialization: Small (1-33%) Tunneling: No Undermining: No Wound Description Classification: Full Thickness Without Exposed Support Structures Wound Margin: Flat and Intact Exudate Amount: Medium Exudate Type: Serous Exudate Color: amber Foul Odor After Cleansing: No Slough/Fibrino Yes Wound Bed Granulation Amount: Large (67-100%) Exposed Structure Granulation Quality: Red Fascia Exposed: No Necrotic Amount: Small (1-33%) Fat Layer (Subcutaneous Tissue) Exposed: Yes Necrotic Quality: Adherent Slough Tendon Exposed: No Muscle Exposed: No Joint Exposed: No Bone Exposed: No Treatment Notes Wound #17 (Lower Leg) Wound Laterality: Left, Medial Cleanser Peri-Wound Care Sween Lotion (Moisturizing lotion) Discharge Instruction: Apply moisturizing lotion as directed Topical Primary Dressing KerraCel Ag Gelling Fiber Dressing, 4x5 in (silver alginate) Discharge Instruction: Apply silver alginate to wound bed as instructed Secondary Dressing ABD Pad, 8x10 Discharge Instruction: Apply over primary dressing as directed. Zetuvit Plus 4x8 in Discharge Instruction: Apply over primary  dressing as directed. Secured With Compression Wrap Kerlix Roll 4.5x3.1 (in/yd) Discharge Instruction: Apply Kerlix and Coban compression as directed. Coban Self-Adherent Wrap 4x5 (in/yd) Discharge Instruction: Apply over Kerlix as directed. Compression Stockings Add-Ons Electronic Signature(s) Signed: 05/24/2022 5:30:40 PM By: Baruch Gouty RN, BSN Entered By: Baruch Gouty on 05/24/2022 13:38:17 -------------------------------------------------------------------------------- [place] Details Patient Name: Date of Service: [name], [name] 05/24/2022 12:30 PM Medical Record Number: [id number] Patient Account Number: [id number] Date  of Birth/Sex: Treating RN: Apr 26, 1936 (86 y.o. Vincent [name] Primary Care Christalynn [place]: [name] Other Clinician: Referring Geovani Tootle: Treating Lambros Cerro/Extender: Celine Ahr in Treatment: 5 Vital Signs Time Taken: 13:13 Temperature (F): 98.6 Height (in): 69 Pulse (bpm): 97 Weight (lbs): 175 Respiratory Rate (breaths/min): 20 Body Mass Index (BMI): 25.8 Blood Pressure (mmHg): 148/86 Capillary Blood Glucose (mg/dl): 120 Reference Range: 80 - 120 mg / dl Notes glucose per pt report this am Electronic Signature(s) Signed: 05/24/2022 5:30:40 PM By: Baruch Gouty RN, BSN Entered By: Baruch Gouty on 05/24/2022 13:13:47

## 2022-05-25 NOTE — Progress Notes (Signed)
Vincent [name], Vincent [name]. ([id number]) Visit Report for 05/24/2022 Chief Complaint Document Details Patient Name: Date of Service: Vincent Vincent [name] Vincent [name]. 05/24/2022 12:30 PM Medical Record Number: [id number] Patient Account Number: [id number] Date of Birth/Sex: Treating RN: 09-02-1935 (86 y.o. [name]) Primary Care Provider: [name] Other Clinician: Referring Provider: Treating Provider/Extender: Celine Ahr in Treatment: 5 Information Obtained from: Patient Chief Complaint 07/15/2021 patient is here for wounds on his bilateral lower legs 04/15/2022: The patient returns to clinic with new bilateral leg wounds. 05/24/2022: Back with new bilateral leg wounds Electronic Signature(s) Signed: 05/24/2022 2:19:15 PM By: Fredirick Maudlin MD FACS Entered By: Fredirick Maudlin on 05/24/2022 14:19:15 -------------------------------------------------------------------------------- Debridement Details Patient Name: Date of Service: Vincent [name], Vincent V Vincent [name]. 05/24/2022 12:30 PM Medical Record Number: [id number] Patient Account Number: [id number] Date of Birth/Sex: Treating RN: [birth year] (86 y.o. Vincent [name] Primary Care Provider: [name] Other Clinician: Referring Provider: Treating Provider/Extender: Celine Ahr in Treatment: 5 Debridement Performed for Assessment: Wound #14 Right,Circumferential Lower Leg Performed By: Physician Fredirick Maudlin, MD Debridement Type: Debridement Severity of Tissue Pre Debridement: Fat layer exposed Level of Consciousness (Pre-procedure): Awake and Alert Pre-procedure Verification/Time Out Yes - 13:45 Taken: Start Time: 13:47 Pain Control: Lidocaine 4% T opical Solution T Area Debrided (L x W): otal 8 (cm) x 20 (cm) = 160 (cm) Tissue and other material debrided: Non-Viable, Slough, Biofilm, Slough Level: Non-Viable Tissue Debridement Description: Selective/Open Wound Instrument: Curette Bleeding:  Minimum Hemostasis Achieved: Pressure Procedural Pain: 4 Post Procedural Pain: 3 Response to Treatment: Procedure was tolerated well Level of Consciousness (Post- Awake and Alert procedure): Post Debridement Measurements of Total Wound Length: (cm) 15.5 Width: (cm) 35.2 Depth: (cm) 0.1 Volume: (cm) 42.851 Character of Wound/Ulcer Post Debridement: Improved Severity of Tissue Post Debridement: Fat layer exposed Post Procedure Diagnosis Same as Pre-procedure Notes scribed by Baruch Gouty, RN for Dr. Celine Ahr Electronic Signature(s) Signed: 05/24/2022 5:30:40 PM By: Baruch Gouty RN, BSN Signed: 05/25/2022 12:22:25 PM By: Fredirick Maudlin MD FACS Previous Signature: 05/24/2022 2:25:31 PM Version By: Fredirick Maudlin MD FACS Entered By: Baruch Gouty on 05/24/2022 16:33:03 -------------------------------------------------------------------------------- Debridement Details Patient Name: Date of Service: Vincent [name], Vincent V Vincent [name]. 05/24/2022 12:30 PM Medical Record Number: [id number] Patient Account Number: [id number] Date of Birth/Sex: Treating RN: 10-29-1935 (86 y.o. Vincent [name] Primary Care Provider: [name] Other Clinician: Referring Provider: Treating Provider/Extender: Celine Ahr in Treatment: 5 Debridement Performed for Assessment: Wound #15 Left,Anterior Lower Leg Performed By: Physician Fredirick Maudlin, MD Debridement Type: Debridement Severity of Tissue Pre Debridement: Fat layer exposed Level of Consciousness (Pre-procedure): Awake and Alert Pre-procedure Verification/Time Out Yes - 13:45 Taken: Start Time: 13:47 Pain Control: Lidocaine 4% T opical Solution T Area Debrided (L x W): otal 3.5 (cm) x 5.3 (cm) = 18.55 (cm) Tissue and other material debrided: Non-Viable, Slough, Biofilm, Slough Level: Non-Viable Tissue Debridement Description: Selective/Open Wound Instrument: Curette Bleeding: Minimum Hemostasis Achieved:  Pressure Procedural Pain: 4 Post Procedural Pain: 3 Response to Treatment: Procedure was tolerated well Level of Consciousness (Post- Awake and Alert procedure): Post Debridement Measurements of Total Wound Length: (cm) 3.5 Width: (cm) 5.3 Depth: (cm) 0.1 Volume: (cm) 1.457 Character of Wound/Ulcer Post Debridement: Improved Severity of Tissue Post Debridement: Fat layer exposed Post Procedure Diagnosis Same as Pre-procedure Notes scribed by Baruch Gouty, RN for Dr. Celine Ahr Electronic Signature(s) Signed: 05/24/2022 5:30:40 PM By: Baruch Gouty RN, BSN Signed: 05/25/2022 12:22:25 PM By: Fredirick Maudlin MD FACS Previous  Signature: 05/24/2022 2:25:31 PM Version By: Fredirick Maudlin MD FACS Entered By: Baruch Gouty on 05/24/2022 16:33:13 -------------------------------------------------------------------------------- Debridement Details Patient Name: Date of Service: Vincent [name], Vincent V Vincent [name]. 05/24/2022 12:30 PM Medical Record Number: [id number] Patient Account Number: [id number] Date of Birth/Sex: Treating RN: 1936/07/01 (86 y.o. Vincent [name] Primary Care Provider: [name] Other Clinician: Referring Provider: Treating Provider/Extender: Celine Ahr in Treatment: 5 Debridement Performed for Assessment: Wound #16 Left,Lateral Lower Leg Performed By: Physician Fredirick Maudlin, MD Debridement Type: Debridement Severity of Tissue Pre Debridement: Fat layer exposed Level of Consciousness (Pre-procedure): Awake and Alert Pre-procedure Verification/Time Out Yes - 13:45 Taken: Start Time: 13:47 Pain Control: Lidocaine 4% T opical Solution T Area Debrided (L x W): otal 6 (cm) x 5 (cm) = 30 (cm) Tissue and other material debrided: Non-Viable, Slough, Biofilm, Slough Level: Non-Viable Tissue Debridement Description: Selective/Open Wound Instrument: Curette Bleeding: Minimum Hemostasis Achieved: Pressure Procedural Pain: 4 Post Procedural  Pain: 3 Response to Treatment: Procedure was tolerated well Level of Consciousness (Post- Awake and Alert procedure): Post Debridement Measurements of Total Wound Length: (cm) 8.3 Width: (cm) 5.2 Depth: (cm) 0.1 Volume: (cm) 3.39 Character of Wound/Ulcer Post Debridement: Improved Severity of Tissue Post Debridement: Fat layer exposed Post Procedure Diagnosis Same as Pre-procedure Notes scribed by Baruch Gouty, RN for Dr. Celine Ahr Electronic Signature(s) Signed: 05/24/2022 5:30:40 PM By: Baruch Gouty RN, BSN Signed: 05/25/2022 12:22:25 PM By: Fredirick Maudlin MD FACS Previous Signature: 05/24/2022 2:25:31 PM Version By: Fredirick Maudlin MD FACS Entered By: Baruch Gouty on 05/24/2022 16:33:24 -------------------------------------------------------------------------------- Debridement Details Patient Name: Date of Service: Vincent [name], Vincent V Vincent [name]. 05/24/2022 12:30 PM Medical Record Number: [id number] Patient Account Number: [id number] Date of Birth/Sex: Treating RN: [birth year] (86 y.o. Vincent [name] Primary Care Provider: [name] Other Clinician: Referring Provider: Treating Provider/Extender: Celine Ahr in Treatment: 5 Debridement Performed for Assessment: Wound #17 Left,Medial Lower Leg Performed By: Physician Fredirick Maudlin, MD Debridement Type: Debridement Severity of Tissue Pre Debridement: Fat layer exposed Level of Consciousness (Pre-procedure): Awake and Alert Pre-procedure Verification/Time Out Yes - 13:45 Taken: Start Time: 13:47 Pain Control: Lidocaine 4% T opical Solution T Area Debrided (L x W): otal 4.5 (cm) x 4 (cm) = 18 (cm) Tissue and other material debrided: Non-Viable, Slough, Biofilm, Slough Level: Non-Viable Tissue Debridement Description: Selective/Open Wound Instrument: Curette Bleeding: Minimum Hemostasis Achieved: Pressure Procedural Pain: 4 Post Procedural Pain: 3 Response to Treatment: Procedure was  tolerated well Level of Consciousness (Post- Awake and Alert procedure): Post Debridement Measurements of Total Wound Length: (cm) 4.5 Width: (cm) 4 Depth: (cm) 0.1 Volume: (cm) 1.414 Character of Wound/Ulcer Post Debridement: Improved Severity of Tissue Post Debridement: Fat layer exposed Post Procedure Diagnosis Same as Pre-procedure Notes scribed by Baruch Gouty, RN for Dr. Celine Ahr Electronic Signature(s) Signed: 05/24/2022 5:30:40 PM By: Baruch Gouty RN, BSN Signed: 05/25/2022 12:22:25 PM By: Fredirick Maudlin MD FACS Previous Signature: 05/24/2022 2:25:31 PM Version By: Fredirick Maudlin MD FACS Entered By: Baruch Gouty on 05/24/2022 16:33:35 -------------------------------------------------------------------------------- HPI Details Patient Name: Date of Service: Vincent [name], Vincent V Vincent [name]. 05/24/2022 12:30 PM Medical Record Number: [id number] Patient Account Number: [id number] Date of Birth/Sex: Treating RN: [birth year] (86 y.o. [name]) Primary Care Provider: [name] Other Clinician: Referring Provider: Treating Provider/Extender: Celine Ahr in Treatment: 5 History of Present Illness HPI Description: ADMISSION 07/15/2021 This is an 86 year old man who came here for evaluation of wounds on his bilateral predominantly anterior lower legs mid  aspect of the tibia. He seems to have been dealing with this for most of this year. Currently with 3 wounds on the right and 2 on the left. At least some of these seem to start off as blisters probably the majority. He has been following with Dr. Anabel Bene of [facility] dermatology according the patient back he has an appointment Dr. Anabel Bene tomorrow he was given a prescription for triamcinolone which she has been applying daily in fact it was renewed yesterday. Patient is a diabetic no recent hemoglobin A1c. He is on Coumadin for persistent atrial fibrillation. Vein and vascular in December 2021 at which time he had  bilateral foot pain and finger pain. ABIs bilaterally were noncompressible however the TBI on the right was only 0.39. There were biphasic waveforms on the left his ABI was noncompressible with biphasic waveforms but with a TBI of 0.6. He was not felt to have a primary vascular issue. There is a [name] of Raynaud's phenomenon as well. Past medical history includes cellulitis of the right leg in June 2022, type 2 diabetes, persistent lower extremity edema, bilateral foot and ankle pain, Raynaud's phenomenon, coronary artery disease, congestive heart failure, pacemaker, none sustained ventricular tachycardia, remote CVA paroxysmal atrial fib on Coumadin. We did not reattempt ABIs in our clinic 07/22/2019 patient appears to be doing decently well in regard to his wounds as compared to last week with the wounds that were present last week. Fortunately I do not see any signs of active infection which is great news. No fevers, chills, nausea, vomiting, or diarrhea. With that being said he has several new blisters that are getting need to be addressed today. 07/28/2021 upon evaluation today patient actually appears to be making excellent progress. I am extremely pleased with where we stand today. I think that his wounds are significantly improved compared to where they were previous. 08/04/2021 upon evaluation today patient appears to be doing much better in regard to his wounds in general. I feel like across the board he is showing signs of significant improvement. Fortunately there is no evidence of infection which is great news. No fevers, chills, nausea, vomiting, or diarrhea. 08/11/2021 upon evaluation today patient actually appears to be doing quite well in regard to his wound. Has been tolerating the dressing changes without complication. Fortunately I do not see any signs of infection currently which is great news as well. No fevers, chills, nausea, vomiting, or diarrhea. 08/18/2021 on evaluation  today patient appears to be doing excellent he is making good progress and overall I am extremely pleased with where we stand today. There does not appear to be any signs of active infection at this time which is great news. 09/01/2020 upon evaluation patient's wounds on both the left and the right appear to be at 0.1 cm measurement and are very close to complete resolution. Were not quite completely done yet but this is very close. Overall I am extremely happy with where things stand and I think he is headed in the right direction. 09/08/2021 upon evaluation today patient appears to be doing well in regard to his right leg which is showing signs of being completely healed which is excellent. Fortunately I do not see any evidence of active infection locally nor systemically at this point which is also great news. No fevers, chills, nausea, vomiting, or diarrhea. 09/15/2021; this is a patient we have been following for now a small wound on the left dorsal foot. He had wounds on the right legs and  was discharged to 20/30 below-knee stockings apparently in late December. He came in today with marked increase in swelling in the right leg large areas of denuded skin which probably started as blisters. He did not have a stocking on 10/06/2021 upon evaluation today patient appears to be doing decently well in regard to his wounds. On the left leg he is completely healed on the right leg he does still have a small area that is open although again its not as good as it was last time I saw him is also not as bad as it was in the beginning. He has been in the hospital and they did not wrap him as part of the issue I believe here as well. 10/20/2021 upon evaluation today patient appears to be doing well with regard to his leg ulcer. He has been tolerating the dressing changes without complication. Fortunately I do not see any signs of active infection locally or systemically at this point. No fevers, chills, nausea,  vomiting, or diarrhea. 10/27/2021 upon evaluation today patient appears to be doing well with regard to his wound. In fact this appears to be almost completely healed which is great news. Fortunately I do not see any evidence of active infection locally nor systemically which is great news. No fevers, chills, nausea, vomiting, or diarrhea. 11/03/2021 upon evaluation today patient is very close to complete resolution he just has a very tiny area on the right leg still open at this point. He did go get socks from [employer] as I discussed with him at the last visit unfortunately he actually got diabetic socks and compression socks. I explained those are not the same thing and he voiced understanding he is going to go to a different [employer] and try to find exactly what we are looking for. Fortunately I do not see any signs of infection and I think he is very close to discharge will probably be ready for next week. 11/10/2021: T oday the patient's right lower extremity wound is closed. He was able to get the low grade compression socks from [employer] and is wearing 1 on the left leg. READMISSION 04/15/2022 The patient returns to clinic today with new bilateral lower extremity leg wounds. He says that he has not been using moisturizer regularly and is not wearing his compression stockings. The wounds are superficial. He has eschar buildup on the surfaces and thickened dry, cracked skin. Under the eschar, there is a little bit of slough on the wound surfaces. 04/25/2022: All of the wounds are little bit smaller with just a little eschar on their surfaces. The right anterior tibial wound is closed. 05/04/2022: All of his wounds are healed. 05/24/2022: The patient returns today with extensive bilateral lower leg wounds. On the right, the wounds are essentially circumferential; on the left, they are clustered in 3 separate areas. There is slough accumulation, along with some eschar. He reports that his legs reopened  shortly after his discharge from the clinic a couple of weeks ago. He says that he was unable to apply his compression stockings. He subsequently purchased a stocking donner but the compression socks that he has are only 8 to 15 mmHg compression. Electronic Signature(s) Signed: 05/24/2022 2:21:17 PM By: Fredirick Maudlin MD FACS Entered By: Fredirick Maudlin on 05/24/2022 14:21:16 -------------------------------------------------------------------------------- Physical Exam Details Patient Name: Date of Service: Vincent [name], Vincent V Vincent [name]. 05/24/2022 12:30 PM Medical Record Number: [id number] Patient Account Number: [id number] Date of Birth/Sex: Treating RN: [birth year] (86 y.o. [name]) Primary Care  Provider: [name] Other Clinician: Referring Provider: Treating Provider/Extender: Celine Ahr in Treatment: 5 Constitutional Slightly hypertensive. . . . No acute distress.Marland Kitchen Respiratory Normal work of breathing on room air.. Notes 05/24/2022: The patient returns today with extensive bilateral lower leg wounds. On the right, the wounds are essentially circumferential; on the left, they are clustered in 3 separate areas. There is slough accumulation, along with some eschar. Electronic Signature(s) Signed: 05/24/2022 2:22:12 PM By: Fredirick Maudlin MD FACS Entered By: Fredirick Maudlin on 05/24/2022 14:22:12 -------------------------------------------------------------------------------- Physician Orders Details Patient Name: Date of Service: Vincent [name], Vincent V Vincent [name]. 05/24/2022 12:30 PM Medical Record Number: [id number] Patient Account Number: [id number] Date of Birth/Sex: Treating RN: 12/09/1935 (86 y.o. Vincent [name] Primary Care Provider: [name] Other Clinician: Referring Provider: Treating Provider/Extender: Celine Ahr in Treatment: 5 Verbal / Phone Orders: No Diagnosis Coding ICD-10 Coding Code Description [phone] Non-pressure chronic  ulcer of other part of left lower leg limited to breakdown of skin L97.811 Non-pressure chronic ulcer of other part of right lower leg limited to breakdown of skin I87.2 Venous insufficiency (chronic) (peripheral) I50.9 Heart failure, unspecified E11.622 Type 2 diabetes mellitus with other skin ulcer Follow-up Appointments ppointment in 1 week. - Dr. Celine Ahr RM 1 Return A Bathing/ Shower/ Hygiene May shower with protection but do not get wound dressing(s) wet. Edema Control - Lymphedema / SCD / Other Elevate legs to the level of the heart or above for 30 minutes daily and/or when sitting, a frequency of: - whenever sitting throughout the day Avoid standing for long periods of time. Exercise regularly Wound Treatment Wound #14 - Lower Leg Wound Laterality: Right, Circumferential Peri-Wound Care: Sween Lotion (Moisturizing lotion) 1 x Per Week Discharge Instructions: Apply moisturizing lotion as directed Prim Dressing: KerraCel Ag Gelling Fiber Dressing, 4x5 in (silver alginate) 1 x Per Week ary Discharge Instructions: Apply silver alginate to wound bed as instructed Secondary Dressing: ABD Pad, 8x10 1 x Per Week Discharge Instructions: Apply over primary dressing as directed. Secondary Dressing: Zetuvit Plus 4x8 in 1 x Per Week Discharge Instructions: Apply over primary dressing as directed. Compression Wrap: Kerlix Roll 4.5x3.1 (in/yd) 1 x Per Week Discharge Instructions: Apply Kerlix and Coban compression as directed. Compression Wrap: Coban Self-Adherent Wrap 4x5 (in/yd) 1 x Per Week Discharge Instructions: Apply over Kerlix as directed. Wound #15 - Lower Leg Wound Laterality: Left, Anterior Peri-Wound Care: Sween Lotion (Moisturizing lotion) 1 x Per Week Discharge Instructions: Apply moisturizing lotion as directed Prim Dressing: KerraCel Ag Gelling Fiber Dressing, 4x5 in (silver alginate) 1 x Per Week ary Discharge Instructions: Apply silver alginate to wound bed as  instructed Secondary Dressing: ABD Pad, 8x10 1 x Per Week Discharge Instructions: Apply over primary dressing as directed. Secondary Dressing: Zetuvit Plus 4x8 in 1 x Per Week Discharge Instructions: Apply over primary dressing as directed. Compression Wrap: Kerlix Roll 4.5x3.1 (in/yd) 1 x Per Week Discharge Instructions: Apply Kerlix and Coban compression as directed. Compression Wrap: Coban Self-Adherent Wrap 4x5 (in/yd) 1 x Per Week Discharge Instructions: Apply over Kerlix as directed. Wound #16 - Lower Leg Wound Laterality: Left, Lateral Peri-Wound Care: Sween Lotion (Moisturizing lotion) 1 x Per Week Discharge Instructions: Apply moisturizing lotion as directed Prim Dressing: KerraCel Ag Gelling Fiber Dressing, 4x5 in (silver alginate) 1 x Per Week ary Discharge Instructions: Apply silver alginate to wound bed as instructed Secondary Dressing: ABD Pad, 8x10 1 x Per Week Discharge Instructions: Apply over primary dressing as directed. Secondary Dressing:  Zetuvit Plus 4x8 in 1 x Per Week Discharge Instructions: Apply over primary dressing as directed. Compression Wrap: Kerlix Roll 4.5x3.1 (in/yd) 1 x Per Week Discharge Instructions: Apply Kerlix and Coban compression as directed. Compression Wrap: Coban Self-Adherent Wrap 4x5 (in/yd) 1 x Per Week Discharge Instructions: Apply over Kerlix as directed. Wound #17 - Lower Leg Wound Laterality: Left, Medial Peri-Wound Care: Sween Lotion (Moisturizing lotion) 1 x Per Week Discharge Instructions: Apply moisturizing lotion as directed Prim Dressing: KerraCel Ag Gelling Fiber Dressing, 4x5 in (silver alginate) 1 x Per Week ary Discharge Instructions: Apply silver alginate to wound bed as instructed Secondary Dressing: ABD Pad, 8x10 1 x Per Week Discharge Instructions: Apply over primary dressing as directed. Secondary Dressing: Zetuvit Plus 4x8 in 1 x Per Week Discharge Instructions: Apply over primary dressing as directed. Compression  Wrap: Kerlix Roll 4.5x3.1 (in/yd) 1 x Per Week Discharge Instructions: Apply Kerlix and Coban compression as directed. Compression Wrap: Coban Self-Adherent Wrap 4x5 (in/yd) 1 x Per Week Discharge Instructions: Apply over Kerlix as directed. Patient Medications llergies: No Known Allergies A Notifications Medication Indication Start End prior to debriedement 05/24/2022 lidocaine DOSE topical 4 % cream - cream topical Electronic Signature(s) Signed: 05/24/2022 2:25:31 PM By: Fredirick Maudlin MD FACS Entered By: Fredirick Maudlin on 05/24/2022 14:22:25 -------------------------------------------------------------------------------- Problem List Details Patient Name: Date of Service: Vincent [name], Vincent V Vincent [name]. 05/24/2022 12:30 PM Medical Record Number: [id number] Patient Account Number: [id number] Date of Birth/Sex: Treating RN: 1936-01-26 (86 y.o. Vincent [name] Primary Care Provider: [name] Other Clinician: Referring Provider: Treating Provider/Extender: Celine Ahr in Treatment: 5 Active Problems ICD-10 Encounter Code Description Active Date MDM Diagnosis [phone] Non-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 leg limited to breakdown 04/15/2022 No Yes of skin L97.811 Non-pressure chronic ulcer of other part of right lower leg limited to breakdown 04/15/2022 No Yes of skin I87.2 Venous insufficiency (chronic) (peripheral) 04/15/2022 No Yes I50.9 Heart failure, unspecified 04/15/2022 No Yes E11.622 Type 2 diabetes mellitus with other skin ulcer 04/15/2022 No Yes Inactive Problems Resolved Problems Electronic Signature(s) Signed: 05/24/2022 2:17:22 PM By: Fredirick Maudlin MD FACS Entered By: Fredirick Maudlin on 05/24/2022 14:17:22 -------------------------------------------------------------------------------- Progress Note Details Patient Name: Date of Service: Vincent [name], Vincent V Vincent [name]. 05/24/2022 12:30 PM Medical Record Number: [id number] Patient  Account Number: [id number] Date of Birth/Sex: Treating RN: 10-20-1935 (86 y.o. [name]) Primary Care Provider: [name] Other Clinician: Referring Provider: Treating Provider/Extender: Celine Ahr in Treatment: 5 Subjective Chief Complaint Information obtained from Patient 07/15/2021 patient is here for wounds on his bilateral lower legs 04/15/2022: The patient returns to clinic with new bilateral leg wounds. 05/24/2022: Back with new bilateral leg wounds History of Present Illness (HPI) ADMISSION 07/15/2021 This is an 86 year old man who came here for evaluation of wounds on his bilateral predominantly anterior lower legs mid aspect of the tibia. He seems to have been dealing with this for most of this year. Currently with 3 wounds on the right and 2 on the left. At least some of these seem to start off as blisters probably the majority. He has been following with Dr. Anabel Bene of [facility] dermatology according the patient back he has an appointment Dr. Anabel Bene tomorrow he was given a prescription for triamcinolone which she has been applying daily in fact it was renewed yesterday. Patient is a diabetic no recent hemoglobin A1c. He is on Coumadin for persistent atrial fibrillation. Vein and vascular in December 2021 at which time  he had bilateral foot pain and finger pain. ABIs bilaterally were noncompressible however the TBI on the right was only 0.39. There were biphasic waveforms on the left his ABI was noncompressible with biphasic waveforms but with a TBI of 0.6. He was not felt to have a primary vascular issue. There is a [name] of Raynaud's phenomenon as well. Past medical history includes cellulitis of the right leg in June 2022, type 2 diabetes, persistent lower extremity edema, bilateral foot and ankle pain, Raynaud's phenomenon, coronary artery disease, congestive heart failure, pacemaker, none sustained ventricular tachycardia, remote CVA paroxysmal atrial  fib on Coumadin. We did not reattempt ABIs in our clinic 07/22/2019 patient appears to be doing decently well in regard to his wounds as compared to last week with the wounds that were present last week. Fortunately I do not see any signs of active infection which is great news. No fevers, chills, nausea, vomiting, or diarrhea. With that being said he has several new blisters that are getting need to be addressed today. 07/28/2021 upon evaluation today patient actually appears to be making excellent progress. I am extremely pleased with where we stand today. I think that his wounds are significantly improved compared to where they were previous. 08/04/2021 upon evaluation today patient appears to be doing much better in regard to his wounds in general. I feel like across the board he is showing signs of significant improvement. Fortunately there is no evidence of infection which is great news. No fevers, chills, nausea, vomiting, or diarrhea. 08/11/2021 upon evaluation today patient actually appears to be doing quite well in regard to his wound. Has been tolerating the dressing changes without complication. Fortunately I do not see any signs of infection currently which is great news as well. No fevers, chills, nausea, vomiting, or diarrhea. 08/18/2021 on evaluation today patient appears to be doing excellent he is making good progress and overall I am extremely pleased with where we stand today. There does not appear to be any signs of active infection at this time which is great news. 09/01/2020 upon evaluation patient's wounds on both the left and the right appear to be at 0.1 cm measurement and are very close to complete resolution. Were not quite completely done yet but this is very close. Overall I am extremely happy with where things stand and I think he is headed in the right direction. 09/08/2021 upon evaluation today patient appears to be doing well in regard to his right leg which is showing  signs of being completely healed which is excellent. Fortunately I do not see any evidence of active infection locally nor systemically at this point which is also great news. No fevers, chills, nausea, vomiting, or diarrhea. 09/15/2021; this is a patient we have been following for now a small wound on the left dorsal foot. He had wounds on the right legs and was discharged to 20/30 below-knee stockings apparently in late December. He came in today with marked increase in swelling in the right leg large areas of denuded skin which probably started as blisters. He did not have a stocking on 10/06/2021 upon evaluation today patient appears to be doing decently well in regard to his wounds. On the left leg he is completely healed on the right leg he does still have a small area that is open although again its not as good as it was last time I saw him is also not as bad as it was in the beginning. He has been in the  hospital and they did not wrap him as part of the issue I believe here as well. 10/20/2021 upon evaluation today patient appears to be doing well with regard to his leg ulcer. He has been tolerating the dressing changes without complication. Fortunately I do not see any signs of active infection locally or systemically at this point. No fevers, chills, nausea, vomiting, or diarrhea. 10/27/2021 upon evaluation today patient appears to be doing well with regard to his wound. In fact this appears to be almost completely healed which is great news. Fortunately I do not see any evidence of active infection locally nor systemically which is great news. No fevers, chills, nausea, vomiting, or diarrhea. 11/03/2021 upon evaluation today patient is very close to complete resolution he just has a very tiny area on the right leg still open at this point. He did go get socks from [place] as I discussed with him at the last visit unfortunately he actually got diabetic socks and compression socks. I explained those  are not the same thing and he voiced understanding he is going to go to a different [facility] and try to find exactly what we are looking for. Fortunately I do not see any signs of infection and I think he is very close to discharge will probably be ready for next week. 11/10/2021: T oday the patient's right lower extremity wound is closed. He was able to get the low grade compression socks from [facility] and is wearing 1 on the left leg. READMISSION 04/15/2022 The patient returns to clinic today with new bilateral lower extremity leg wounds. He says that he has not been using moisturizer regularly and is not wearing his compression stockings. The wounds are superficial. He has eschar buildup on the surfaces and thickened dry, cracked skin. Under the eschar, there is a little bit of slough on the wound surfaces. 04/25/2022: All of the wounds are little bit smaller with just a little eschar on their surfaces. The right anterior tibial wound is closed. 05/04/2022: All of his wounds are healed. 05/24/2022: The patient returns today with extensive bilateral lower leg wounds. On the right, the wounds are essentially circumferential; on the left, they are clustered in 3 separate areas. There is slough accumulation, along with some eschar. He reports that his legs reopened shortly after his discharge from the clinic a couple of weeks ago. He says that he was unable to apply his compression stockings. He subsequently purchased a stocking donner but the compression socks that he has are only 8 to 15 mmHg compression. Patient History Information obtained from Patient, Chart. Family History Cancer - Mother,Siblings, Heart Disease - Father, Hypertension - Mother,Father, Thyroid Problems - Siblings, No family history of Diabetes, Hereditary Spherocytosis, Kidney Disease, Lung Disease, Seizures, Stroke, Tuberculosis. Social History Former smoker, Marital Status - Divorced, Alcohol Use - Never, Drug Use - No History,  Caffeine Use - Rarely - coffee. Medical History Eyes Patient has history of Cataracts - left eye, Glaucoma Denies history of Optic Neuritis Respiratory Patient has history of Chronic Obstructive Pulmonary Disease (COPD) Cardiovascular Patient has history of Arrhythmia - afib, Congestive Heart Failure, Coronary Artery Disease, Hypertension, Myocardial Infarction Endocrine Patient has history of Type II Diabetes Denies history of Type I Diabetes Genitourinary Denies history of End Stage Renal Disease Integumentary (Skin) Denies history of History of Burn Musculoskeletal Patient has history of Gout Neurologic Patient has history of Neuropathy Oncologic Denies history of Received Chemotherapy, Received Radiation Psychiatric Denies history of Anorexia/bulimia, Confinement Anxiety Hospitalization/Surgery History -  cardiac cath with stents. - colonoscopy. - knee surgery. Medical A Surgical History Notes nd Respiratory pulmonary eosinophilia Cardiovascular hyperlipidemia, alcoholic cardiomyopathy Gastrointestinal GERD Genitourinary incontinence, enlarged prostate Neurologic left foot drop, stroke Objective Constitutional Slightly hypertensive. No acute distress.. Vitals Time Taken: 1:13 PM, Height: 69 in, Weight: 175 lbs, BMI: 25.8, Temperature: 98.6 F, Pulse: 97 bpm, Respiratory Rate: 20 breaths/min, Blood Pressure: 148/86 mmHg, Capillary Blood Glucose: 120 mg/dl. General Notes: glucose per pt report this am Respiratory Normal work of breathing on room air.. General Notes: 05/24/2022: The patient returns today with extensive bilateral lower leg wounds. On the right, the wounds are essentially circumferential; on the left, they are clustered in 3 separate areas. There is slough accumulation, along with some eschar. Integumentary (Hair, Skin) Wound #14 status is Open. Original cause of wound was Blister. The date acquired was: 05/09/2022. The wound is located on the  Right,Circumferential Lower Leg. The wound measures 15.5cm length x 35.2cm width x 0.1cm depth; 428.513cm^2 area and 42.851cm^3 volume. There is Fat Layer (Subcutaneous Tissue) exposed. There is no tunneling or undermining noted. There is a large amount of serous drainage noted. The wound margin is indistinct and nonvisible. There is large (67-100%) red granulation within the wound bed. There is a small (1-33%) amount of necrotic tissue within the wound bed including Adherent Slough. Wound #15 status is Open. Original cause of wound was Blister. The date acquired was: 05/09/2022. The wound is located on the Left,Anterior Lower Leg. The wound measures 3.5cm length x 5.3cm width x 0.1cm depth; 14.569cm^2 area and 1.457cm^3 volume. There is Fat Layer (Subcutaneous Tissue) exposed. There is no tunneling or undermining noted. There is a medium amount of serous drainage noted. The wound margin is flat and intact. There is large (67-100%) red granulation within the wound bed. There is a small (1-33%) amount of necrotic tissue within the wound bed including Adherent Slough. Wound #16 status is Open. Original cause of wound was Blister. The date acquired was: 05/09/2022. The wound is located on the Left,Lateral Lower Leg. The wound measures 8.3cm length x 5.2cm width x 0.1cm depth; 33.898cm^2 area and 3.39cm^3 volume. There is Fat Layer (Subcutaneous Tissue) exposed. There is no tunneling or undermining noted. There is a medium amount of serous drainage noted. The wound margin is flat and intact. There is large (67-100%) red granulation within the wound bed. There is a small (1-33%) amount of necrotic tissue within the wound bed including Adherent Slough. Wound #17 status is Open. Original cause of wound was Blister. The date acquired was: 05/09/2022. The wound is located on the Left,Medial Lower Leg. The wound measures 4.5cm length x 5.4cm width x 0.1cm depth; 19.085cm^2 area and 1.909cm^3 volume. There is Fat  Layer (Subcutaneous Tissue) exposed. There is no tunneling or undermining noted. There is a medium amount of serous drainage noted. The wound margin is flat and intact. There is large (67-100%) red granulation within the wound bed. There is a small (1-33%) amount of necrotic tissue within the wound bed including Adherent Slough. 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 There was a  Selective/Open Wound Non-Viable Tissue Debridement with a total area of 160 sq cm performed by Fredirick Maudlin, MD. With the following instrument(s): Curette to remove Non-Viable tissue/material. Material removed includes Slough and Biofilm and after achieving pain control using Lidocaine 4% T opical Solution. No specimens were taken. A time out was conducted at 13:45, prior to the start of the procedure. A Minimum amount of bleeding was controlled with Pressure. The procedure was tolerated well with a pain level of 4 throughout and a pain level of 3 following the procedure. Post Debridement Measurements: 15.5cm length x 35.2cm width x 0.1cm depth; 42.851cm^3 volume. Character of Wound/Ulcer Post Debridement is improved. Severity of Tissue Post Debridement is: Fat layer exposed. Post procedure Diagnosis Wound #14: Same as Pre-Procedure General Notes: scribed by Baruch Gouty, RN for Dr. Celine Ahr. Wound #15 Pre-procedure diagnosis of Wound #15 is a Venous Leg Ulcer located on the Left,Anterior Lower Leg .Severity of Tissue Pre Debridement is: Fat  layer exposed. There was a Selective/Open Wound Non-Viable Tissue Debridement with a total area of 18.55 sq cm performed by Fredirick Maudlin, MD. With the following instrument(s): Curette to remove Non-Viable tissue/material. Material removed includes Slough and Biofilm and after achieving pain control using Lidocaine 4% T opical Solution. No specimens were taken. A time out was conducted at 13:45, prior to the start of the procedure. A Minimum amount of bleeding was controlled with Pressure. The procedure was tolerated well with a pain level of 4 throughout and a pain level of 3 following the procedure. Post Debridement Measurements: 3.5cm length x 5.3cm width x 0.1cm depth; 1.457cm^3 volume. Character of Wound/Ulcer Post Debridement is improved. Severity of Tissue Post Debridement is: Fat layer exposed. Post procedure Diagnosis Wound #15: Same as Pre-Procedure General Notes: scribed by Baruch Gouty, RN for Dr. Celine Ahr. Wound #16 Pre-procedure diagnosis of Wound #16 is a Venous Leg Ulcer located on the Left,Lateral Lower Leg .Severity of Tissue Pre Debridement is: Fat layer exposed. There was a Selective/Open Wound Non-Viable Tissue Debridement with a total area of 30 sq cm performed by Fredirick Maudlin, MD. With the following instrument(s): Curette to remove Non-Viable tissue/material. Material removed includes Slough and Biofilm and after achieving pain control using Lidocaine 4% Topical Solution. No specimens were taken. A time out was conducted at 13:45, prior to the start of the procedure. A Minimum amount of bleeding was controlled with Pressure. The procedure was tolerated well with a pain level of 4 throughout and a pain level of 3 following the procedure. Post Debridement Measurements: 8.3cm length x 5.2cm width x 0.1cm depth; 3.39cm^3 volume. Character of Wound/Ulcer Post Debridement is improved. Severity of Tissue Post Debridement is: Fat layer exposed. Post procedure Diagnosis Wound  #16: Same as Pre-Procedure General Notes: scribed by Baruch Gouty, RN for Dr. Celine Ahr. Wound #17 Pre-procedure diagnosis of Wound #17 is a Venous Leg Ulcer located on the Left,Medial Lower Leg .Severity of Tissue Pre Debridement is: Fat layer exposed. There was a Selective/Open Wound Non-Viable Tissue Debridement with a total area of 18 sq cm performed by Fredirick Maudlin, MD. With the following instrument(s): Curette to remove Non-Viable tissue/material. Material removed includes Slough and Biofilm and after achieving pain control using Lidocaine 4% Topical Solution. No specimens were taken. A time out was conducted at 13:45, prior to the start of the procedure. A Minimum amount of bleeding was controlled with Pressure. The procedure was tolerated well with a pain level of 4 throughout and a pain level of 3 following the procedure. Post  Debridement Measurements: 4.5cm length x 4cm width x 0.1cm depth; 1.414cm^3 volume. Character of Wound/Ulcer Post Debridement is improved. Severity of Tissue Post Debridement is: Fat layer exposed. Post procedure Diagnosis Wound #17: Same as Pre-Procedure General Notes: scribed by Baruch Gouty, RN for Dr. Celine Ahr. Plan Follow-up Appointments: Return Appointment in 1 week. - Dr. Celine Ahr RM 1 Bathing/ Shower/ Hygiene: May shower with protection but do not get wound dressing(s) wet. Edema Control - Lymphedema / SCD / Other: Elevate legs to the level of the heart or above for 30 minutes daily and/or when sitting, a frequency of: - whenever sitting throughout the day Avoid standing for long periods of time. Exercise regularly The following medication(s) was prescribed: lidocaine topical 4 % cream cream topical for prior to debriedement was prescribed at facility WOUND #14: - Lower Leg Wound Laterality: Right, Circumferential Peri-Wound Care: Sween Lotion (Moisturizing lotion) 1 x Per Week/ Discharge Instructions: Apply moisturizing lotion as directed Prim  Dressing: KerraCel Ag Gelling Fiber Dressing, 4x5 in (silver alginate) 1 x Per Week/ ary Discharge Instructions: Apply silver alginate to wound bed as instructed Secondary Dressing: ABD Pad, 8x10 1 x Per Week/ Discharge Instructions: Apply over primary dressing as directed. Secondary Dressing: Zetuvit Plus 4x8 in 1 x Per Week/ Discharge Instructions: Apply over primary dressing as directed. Com pression Wrap: Kerlix Roll 4.5x3.1 (in/yd) 1 x Per Week/ Discharge Instructions: Apply Kerlix and Coban compression as directed. Com pression Wrap: Coban Self-Adherent Wrap 4x5 (in/yd) 1 x Per Week/ Discharge Instructions: Apply over Kerlix as directed. WOUND #15: - Lower Leg Wound Laterality: Left, Anterior Peri-Wound Care: Sween Lotion (Moisturizing lotion) 1 x Per Week/ Discharge Instructions: Apply moisturizing lotion as directed Prim Dressing: KerraCel Ag Gelling Fiber Dressing, 4x5 in (silver alginate) 1 x Per Week/ ary Discharge Instructions: Apply silver alginate to wound bed as instructed Secondary Dressing: ABD Pad, 8x10 1 x Per Week/ Discharge Instructions: Apply over primary dressing as directed. Secondary Dressing: Zetuvit Plus 4x8 in 1 x Per Week/ Discharge Instructions: Apply over primary dressing as directed. Com pression Wrap: Kerlix Roll 4.5x3.1 (in/yd) 1 x Per Week/ Discharge Instructions: Apply Kerlix and Coban compression as directed. Com pression Wrap: Coban Self-Adherent Wrap 4x5 (in/yd) 1 x Per Week/ Discharge Instructions: Apply over Kerlix as directed. WOUND #16: - Lower Leg Wound Laterality: Left, Lateral Peri-Wound Care: Sween Lotion (Moisturizing lotion) 1 x Per Week/ Discharge Instructions: Apply moisturizing lotion as directed Prim Dressing: KerraCel Ag Gelling Fiber Dressing, 4x5 in (silver alginate) 1 x Per Week/ ary Discharge Instructions: Apply silver alginate to wound bed as instructed Secondary Dressing: ABD Pad, 8x10 1 x Per Week/ Discharge Instructions:  Apply over primary dressing as directed. Secondary Dressing: Zetuvit Plus 4x8 in 1 x Per Week/ Discharge Instructions: Apply over primary dressing as directed. Com pression Wrap: Kerlix Roll 4.5x3.1 (in/yd) 1 x Per Week/ Discharge Instructions: Apply Kerlix and Coban compression as directed. Com pression Wrap: Coban Self-Adherent Wrap 4x5 (in/yd) 1 x Per Week/ Discharge Instructions: Apply over Kerlix as directed. WOUND #17: - Lower Leg Wound Laterality: Left, Medial Peri-Wound Care: Sween Lotion (Moisturizing lotion) 1 x Per Week/ Discharge Instructions: Apply moisturizing lotion as directed Prim Dressing: KerraCel Ag Gelling Fiber Dressing, 4x5 in (silver alginate) 1 x Per Week/ ary Discharge Instructions: Apply silver alginate to wound bed as instructed Secondary Dressing: ABD Pad, 8x10 1 x Per Week/ Discharge Instructions: Apply over primary dressing as directed. Secondary Dressing: Zetuvit Plus 4x8 in 1 x Per Week/ Discharge Instructions: Apply over primary  dressing as directed. Com pression Wrap: Kerlix Roll 4.5x3.1 (in/yd) 1 x Per Week/ Discharge Instructions: Apply Kerlix and Coban compression as directed. Com pression Wrap: Coban Self-Adherent Wrap 4x5 (in/yd) 1 x Per Week/ Discharge Instructions: Apply over Kerlix as directed. 05/24/2022: The patient returns today with extensive bilateral lower leg wounds. On the right, the wounds are essentially circumferential; on the left, they are clustered in 3 separate areas. There is slough accumulation, along with some eschar. I used a curette to debride slough and eschar from the surface of all of his wounds. We will apply silver alginate with Kerlix and Coban wrapping; due to peripheral vascular disease, we are unable to use a greater degree of compression. He will likely benefit from juxta lite or [place] wrap stockings, so we will measure him for these next week when he returns. Follow-up in 1 week. Electronic Signature(s) Signed:  05/24/2022 5:30:40 PM By: Baruch Gouty RN, BSN Signed: 05/25/2022 12:22:25 PM By: Fredirick Maudlin MD FACS Previous Signature: 05/24/2022 2:23:15 PM Version By: Fredirick Maudlin MD FACS Entered By: Baruch Gouty on 05/24/2022 16:33:54 -------------------------------------------------------------------------------- HxROS Details Patient Name: Date of Service: Vincent [name], Vincent V Vincent [name]. 05/24/2022 12:30 PM Medical Record Number: [id number] Patient Account Number: [id number] Date of Birth/Sex: Treating RN: June 14, 1936 (86 y.o. [name]) Primary Care Provider: [name] Other Clinician: Referring Provider: Treating Provider/Extender: Celine Ahr in Treatment: 5 Information Obtained From Patient Chart Eyes Medical History: Positive for: Cataracts - left eye; Glaucoma Negative for: Optic Neuritis Respiratory Medical History: Positive for: Chronic Obstructive Pulmonary Disease (COPD) Past Medical History Notes: pulmonary eosinophilia Cardiovascular Medical History: Positive for: Arrhythmia - afib; Congestive Heart Failure; Coronary Artery Disease; Hypertension; Myocardial Infarction Past Medical History Notes: hyperlipidemia, alcoholic cardiomyopathy Gastrointestinal Medical History: Past Medical History Notes: GERD Endocrine Medical History: Positive for: Type II Diabetes Negative for: Type I Diabetes Time with diabetes: 2 yrs Treated with: Oral agents Blood sugar tested every day: Yes Tested : Genitourinary Medical History: Negative for: End Stage Renal Disease Past Medical History Notes: incontinence, enlarged prostate Integumentary (Skin) Medical History: Negative for: History of Burn Musculoskeletal Medical History: Positive for: Gout Neurologic Medical History: Positive for: Neuropathy Past Medical History Notes: left foot drop, stroke Oncologic Medical History: Negative for: Received Chemotherapy; Received Radiation Psychiatric Medical  History: Negative for: Anorexia/bulimia; Confinement Anxiety HBO Extended History Items Eyes: Eyes: Cataracts Glaucoma Immunizations Pneumococcal Vaccine: Received Pneumococcal Vaccination: Yes Received Pneumococcal Vaccination On or After 60th Birthday: Yes Implantable Devices No devices added Hospitalization / Surgery History Type of Hospitalization/Surgery cardiac cath with stents colonoscopy knee surgery Family and Social History Cancer: Yes - Mother,Siblings; Diabetes: No; Heart Disease: Yes - Father; Hereditary Spherocytosis: No; Hypertension: Yes - Mother,Father; Kidney Disease: No; Lung Disease: No; Seizures: No; Stroke: No; Thyroid Problems: Yes - Siblings; Tuberculosis: No; Former smoker; Marital Status - Divorced; Alcohol Use: Never; Drug Use: No History; Caffeine Use: Rarely - coffee; Financial Concerns: No; Food, Clothing or Shelter Needs: No; Support System Lacking: No; Transportation Concerns: No Electronic Signature(s) Signed: 05/24/2022 2:25:31 PM By: Fredirick Maudlin MD FACS Entered By: Fredirick Maudlin on 05/24/2022 14:21:48 -------------------------------------------------------------------------------- SuperBill Details Patient Name: Date of Service: Vincent [name], Vincent V Vincent [name]. 05/24/2022 Medical Record Number: [id number] Patient Account Number: [id number] Date of Birth/Sex: Treating RN: 08-Feb-1936 (86 y.o. [name]) Primary Care Provider: [name] Other Clinician: Referring Provider: Treating Provider/Extender: Celine Ahr in Treatment: 5 Diagnosis Coding ICD-10 Codes Code Description [phone] Non-pressure chronic ulcer of other part of left  lower leg limited to breakdown of skin L97.811 Non-pressure chronic ulcer of other part of right lower leg limited to breakdown of skin I87.2 Venous insufficiency (chronic) (peripheral) I50.9 Heart failure, unspecified E11.622 Type 2 diabetes mellitus with other skin ulcer Facility Procedures CPT4  Code: 30092330 Description: [phone] - WOUND CARE VISIT-LEV 5 EST PT Modifier: 25 Quantity: 1 CPT4 Code: [id number] Description: [id number] - DEBRIDE WOUND 1ST 20 SQ CM OR < ICD-10 Diagnosis Description 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 L97.811 Non-pressure chronic ulcer of other part of right lower leg limited to  breakdown Modifier: f skin of skin Quantity: 1 CPT4 Code: [id number] Description: [id number] - DEBRIDE WOUND EA ADDL 20 SQ CM ICD-10 Diagnosis Description 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 L97.811 Non-pressure chronic ulcer of other part of right lower leg limited to  breakdown Modifier: f skin of skin Quantity: 11 Physician Procedures : CPT4 Code Description Modifier [id number] - WC PHYS LEVEL 4 - EST PT 25 ICD-10 Diagnosis Description 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f skin L97.811 Non-pressure chronic ulcer of other part of  right lower leg limited to breakdown of skin I87.2 Venous insufficiency (chronic) (peripheral) E11.622 Type 2 diabetes mellitus with other skin ulcer Quantity: 1 : [id number] - WC PHYS DEBR WO ANESTH 20 SQ CM ICD-10 Diagnosis Description 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f skin L97.811 Non-pressure chronic ulcer of other part of right lower leg limited to  breakdown of skin Quantity: 1 Electronic Signature(s) Signed: 05/24/2022 5:30:40 PM By: Baruch Gouty RN, BSN Signed: 05/25/2022 12:22:25 PM By: Fredirick Maudlin MD FACS Previous Signature: 05/24/2022 2:23:35 PM Version By: Fredirick Maudlin MD FACS Entered By: Baruch Gouty on 05/24/2022 16:26:00

## 2022-05-30 ENCOUNTER — Encounter (HOSPITAL_COMMUNITY): Payer: Medicare Other

## 2022-05-30 DIAGNOSIS — H269 Unspecified cataract: Secondary | ICD-10-CM | POA: Diagnosis not present

## 2022-05-30 DIAGNOSIS — E119 Type 2 diabetes mellitus without complications: Secondary | ICD-10-CM | POA: Diagnosis not present

## 2022-05-30 DIAGNOSIS — H21562 Pupillary abnormality, left eye: Secondary | ICD-10-CM | POA: Diagnosis not present

## 2022-05-30 DIAGNOSIS — H25812 Combined forms of age-related cataract, left eye: Secondary | ICD-10-CM | POA: Diagnosis not present

## 2022-06-01 ENCOUNTER — Encounter (HOSPITAL_BASED_OUTPATIENT_CLINIC_OR_DEPARTMENT_OTHER): Payer: Medicare Other | Attending: General Surgery | Admitting: General Surgery

## 2022-06-01 DIAGNOSIS — E11622 Type 2 diabetes mellitus with other skin ulcer: Secondary | ICD-10-CM | POA: Insufficient documentation

## 2022-06-01 DIAGNOSIS — I872 Venous insufficiency (chronic) (peripheral): Secondary | ICD-10-CM | POA: Diagnosis not present

## 2022-06-01 DIAGNOSIS — I110 Hypertensive heart disease with heart failure: Secondary | ICD-10-CM | POA: Diagnosis not present

## 2022-06-01 DIAGNOSIS — L97811 Non-pressure chronic ulcer of other part of right lower leg limited to breakdown of skin: Secondary | ICD-10-CM | POA: Diagnosis not present

## 2022-06-01 DIAGNOSIS — I509 Heart failure, unspecified: Secondary | ICD-10-CM | POA: Diagnosis not present

## 2022-06-01 DIAGNOSIS — L97821 Non-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 leg limited to breakdown of skin: Secondary | ICD-10-CM | POA: Insufficient documentation

## 2022-06-01 NOTE — Progress Notes (Signed)
Schwenke, Copelan M. ([id number]) Visit Report for 06/01/2022 Arrival Information Details Patient Name: Date of Service: [name] ID M. 06/01/2022 3:30 PM Medical Record Number: [id number] Patient Account Number: [id number] Date of Birth/Sex: Treating RN: 12/31/1935 (86 y.o. [name] Primary Care Bricelyn Freestone: [name] Other Clinician: Referring Loukisha Gunnerson: Treating Arkin Imran/Extender: Celine Ahr in Treatment: 6 Visit Information History Since Last Visit Added or deleted any medications: No Patient Arrived: Ambulatory Any new allergies or adverse reactions: No Arrival Time: 15:58 Had a fall or experienced change in No Accompanied By: self activities of daily living that may affect Transfer Assistance: None risk of falls: Patient Identification Verified: Yes Signs or symptoms of abuse/neglect since last visito No Secondary Verification Process Completed: Yes Hospitalized since last visit: No Patient Requires Transmission-Based Precautions: No Implantable device outside of the clinic excluding No Patient Has Alerts: Yes cellular tissue based products placed in the center Patient Alerts: Patient on Blood Thinner since last visit: Has Dressing in Place as Prescribed: Yes Has Compression in Place as Prescribed: Yes Pain Present Now: No Electronic Signature(s) Signed: 06/01/2022 5:47:46 PM By: Sharyn Creamer RN, BSN Entered By: Sharyn Creamer on 06/01/2022 15:59:17 -------------------------------------------------------------------------------- Encounter Discharge Information Details Patient Name: Date of Service: [name], Shaune Pascal ID M. 06/01/2022 3:30 PM Medical Record Number: [id number] Patient Account Number: [id number] Date of Birth/Sex: Treating RN: 01-Jan-1936 (86 y.o. [name] Primary Care Rylinn Linzy: [name] Other Clinician: Referring Hawke Villalpando: Treating Dixon Luczak/Extender: Celine Ahr in Treatment:  6 Encounter Discharge Information Items Post Procedure Vitals Discharge Condition: Stable Temperature (F): 98.0 Ambulatory Status: Ambulatory Pulse (bpm): 101 Discharge Destination: Home Respiratory Rate (breaths/min): 18 Transportation: Private Auto Blood Pressure (mmHg): 119/74 Accompanied By: self Schedule Follow-up Appointment: Yes Clinical Summary of Care: Patient Declined Electronic Signature(s) Signed: 06/01/2022 5:47:46 PM By: Sharyn Creamer RN, BSN Entered By: Sharyn Creamer on 06/01/2022 17:27:07 -------------------------------------------------------------------------------- Lower Extremity Assessment Details Patient Name: Date of Service: [name], Shaune Pascal ID M. 06/01/2022 3:30 PM Medical Record Number: [id number] Patient Account Number: [id number] Date of Birth/Sex: Treating RN: 1936/08/17 (86 y.o. [name] Primary Care Chanel Mcadams: [name] Other Clinician: Referring Paisly Fingerhut: Treating Ezra Marquess/Extender: Corliss Marcus Weeks in Treatment: 6 Edema Assessment Assessed: [Left: No] [Right: No] Edema: [Left: Yes] [Right: Yes] Calf Left: Right: Point of Measurement: From Medial Instep 36.5 cm 37.9 cm Ankle Left: Right: Point of Measurement: From Medial Instep 24 cm 25.5 cm Vascular Assessment Pulses: Dorsalis Pedis Palpable: [Left:Yes] [Right:Yes] Electronic Signature(s) Signed: 06/01/2022 5:47:46 PM By: Sharyn Creamer RN, BSN Entered By: Sharyn Creamer on 06/01/2022 16:01:21 -------------------------------------------------------------------------------- Multi Wound Chart Details Patient Name: Date of Service: [name], Shaune Pascal ID M. 06/01/2022 3:30 PM Medical Record Number: [id number] Patient Account Number: [id number] Date of Birth/Sex: Treating RN: 08/29/1936 (86 y.o. 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: Diabetic Wound/Ulcer of the Lower Diabetic Wound/Ulcer of the Lower Diabetic Wound/Ulcer of the Lower Secondary Etiology: Extremity Extremity Extremity Cataracts, Glaucoma, Chronic Cataracts, Glaucoma, Chronic Cataracts, Glaucoma, Chronic Comorbid History: Obstructive Pulmonary Disease Obstructive Pulmonary Disease Obstructive Pulmonary Disease (COPD),  Arrhythmia, Congestive (COPD), Arrhythmia, Congestive (COPD), Arrhythmia, Congestive Heart Failure, Coronary Artery Heart Failure, Coronary Artery Heart Failure, Coronary Artery Disease, Hypertension, Myocardial Disease, Hypertension, Myocardial Disease, Hypertension, Myocardial Infarction, Type II Diabetes, Gout, Infarction, Type II Diabetes, Gout, Infarction, Type II Diabetes, Gout, Neuropathy Neuropathy Neuropathy 05/09/2022 05/09/2022 05/09/2022 Date Acquired: '1 1 1 '$ Weeks of Treatment: Open Open Open Wound Status: No No No Wound Recurrence: No No Yes Clustered Wound: N/A N/A 2 Clustered Quantity: 15.5x24x0.1 3x4.2x0.1 5x3.5x0.1 Measurements L x W x D (cm) 292.168 9.896 13.744 A (cm) : rea 29.217 0.99 1.374 Volume (cm) : 31.80% 32.10% 59.50% % Reduction in A rea: 31.80% 32.10% 59.50% % Reduction in Volume: Full Thickness Without Exposed Full Thickness Without Exposed Full Thickness Without Exposed Classification: Support Structures Support Structures Support Structures Large Medium Medium Exudate A mount: Serous Serous Serous Exudate Type: Geophysical data processor Exudate Color: Indistinct, nonvisible Flat  and Intact Flat and Intact Wound Margin: Large (67-100%) Large (67-100%) Large (67-100%) Granulation A mount: Red Red Red Granulation Quality: Small (1-33%) Small (1-33%) Small (1-33%) Necrotic A mount: Fat Layer (Subcutaneous Tissue): Yes Fat Layer (Subcutaneous Tissue): Yes Fat Layer (Subcutaneous Tissue): Yes Exposed Structures: Fascia: No Fascia: No Fascia: No Tendon: No Tendon: No Tendon: No Muscle: No Muscle: No Muscle: No Joint: No Joint: No Joint: No Bone: No Bone: No Bone: No Small (1-33%) Small (1-33%) Small (1-33%) Epithelialization: Debridement - Selective/Open Wound Debridement - Selective/Open Wound Debridement - Selective/Open Wound Debridement: Pre-procedure Verification/Time Out 16:14 16:14 16:14 Taken: Lidocaine 5% topical ointment Lidocaine 5% topical ointment Lidocaine 5% topical ointment Pain Control: Necrotic/Eschar, Psychologist, prison and probation services, Psychologist, prison and probation services, [employer] Tissue Debrided: Non-Viable Tissue Non-Viable Tissue Non-Viable Tissue Level: 310 12.6 17.5 Debridement A (sq cm): rea Curette Curette Curette Instrument: Minimum Minimum Minimum Bleeding: Pressure Pressure Pressure Hemostasis A chieved: 0 0 0 Procedural Pain: 0 0 0 Post Procedural Pain: Procedure was tolerated well Procedure was tolerated well Procedure was tolerated well Debridement Treatment Response: 15.5x24x0.1 3x4.2x0.1 5x3.5x0.1 Post Debridement Measurements L x W x D (cm) 29.217 0.99 1.374 Post Debridement Volume: (cm) No Abnormalities Noted No Abnormalities Noted No Abnormalities Noted Periwound Skin Texture: No Abnormalities Noted No Abnormalities Noted No Abnormalities Noted Periwound Skin Moisture: No Abnormalities Noted No Abnormalities Noted No Abnormalities Noted Periwound Skin Color: No Abnormality No Abnormality No Abnormality Temperature: Debridement Debridement Debridement Procedures Performed: Wound Number: 17 N/A N/A Photos: N/A N/A Left,  Medial Lower Leg N/A N/A Wound Location: Blister N/A N/A Wounding Event: Venous Leg Ulcer N/A N/A Primary Etiology: Diabetic Wound/Ulcer of the Lower N/A N/A Secondary Etiology: Extremity Cataracts, Glaucoma, Chronic N/A N/A Comorbid History: Obstructive Pulmonary Disease (COPD), Arrhythmia, Congestive Heart Failure, Coronary Artery Disease, Hypertension, Myocardial Infarction, Type II Diabetes, Gout, Neuropathy 05/09/2022 N/A N/A Date Acquired: 1 N/A N/A Weeks of Treatment: Open N/A N/A Wound Status: No N/A N/A Wound Recurrence: Yes N/A N/A Clustered Wound: 6 N/A N/A Clustered Quantity: 3x4x0.1 N/A N/A Measurements L x W x D (cm) 9.425 N/A N/A A (cm) : rea 0.942 N/A N/A Volume (cm) : 50.60% N/A N/A % Reduction in Area: 50.70% N/A N/A % Reduction in Volume: Full Thickness Without Exposed N/A N/A Classification: Support Structures Medium N/A N/A Exudate A mount: Serous N/A N/A Exudate Type: amber N/A N/A Exudate Color: Flat and Intact N/A N/A Wound Margin: Large (67-100%) N/A N/A Granulation A mount: Red N/A N/A Granulation Quality: Small (1-33%) N/A N/A Necrotic A mount: Fat Layer (Subcutaneous Tissue): Yes N/A N/A Exposed Structures: Fascia: No Tendon: No  Muscle: No Joint: No Bone: No Small (1-33%) N/A N/A Epithelialization: Debridement - Selective/Open Wound N/A N/A Debridement: Pre-procedure Verification/Time Out 16:14 N/A N/A Taken: Lidocaine 5% topical ointment N/A N/A Pain Control: Necrotic/Eschar, Slough N/A N/A Tissue Debrided: Non-Viable Tissue N/A N/A Level: 12 N/A N/A Debridement A (sq cm): rea Curette N/A N/A Instrument: Minimum N/A N/A Bleeding: Pressure N/A N/A Hemostasis A chieved: 0 N/A N/A Procedural Pain: 0 N/A N/A Post Procedural Pain: Procedure was tolerated well N/A N/A Debridement Treatment Response: 3x4x0.1 N/A N/A Post Debridement Measurements L x W x D (cm) 0.942 N/A N/A Post Debridement Volume:  (cm) No Abnormalities Noted N/A N/A Periwound Skin Texture: No Abnormalities Noted N/A N/A Periwound Skin Moisture: No Abnormalities Noted N/A N/A Periwound Skin Color: No Abnormality N/A N/A Temperature: Debridement N/A N/A Procedures Performed: Treatment Notes Electronic Signature(s) Signed: 06/01/2022 4:51:48 PM By: Fredirick Maudlin MD FACS Entered By: Fredirick Maudlin on 06/01/2022 16:51:48 -------------------------------------------------------------------------------- Multi-Disciplinary Care Plan Details Patient Name: Date of Service: [name], [name] 06/01/2022 3:30 PM Medical Record Number: [id number] Patient Account Number: [id number] Date of Birth/Sex: Treating RN: 1936/05/07 (86 y.o. [name] Primary Care Jesse Hirst: [name] Other Clinician: Referring Elianna Windom: Treating Betrice Wanat/Extender: Celine Ahr in Treatment: 6 Multidisciplinary Care Plan reviewed with physician Active Inactive Venous Leg Ulcer Nursing Diagnoses: Knowledge deficit related to disease process and management Potential for venous Insuffiency (use before diagnosis confirmed) Goals: Patient will maintain optimal edema control Date Initiated: 05/24/2022 Target Resolution Date: 06/21/2022 Goal Status: Active Interventions: Assess peripheral edema status every visit. Compression as ordered Provide education on venous insufficiency Treatment Activities: Therapeutic compression applied : 05/24/2022 Notes: Wound/Skin Impairment Nursing Diagnoses: Impaired tissue integrity Knowledge deficit related to ulceration/compromised skin integrity Goals: Patient/caregiver will verbalize understanding of skin care regimen Date Initiated: 05/24/2022 Target Resolution Date: 06/21/2022 Goal Status: Active Ulcer/skin breakdown will have a volume reduction of 30% by week 4 Date Initiated: 05/24/2022 Target Resolution Date: 06/21/2022 Goal Status:  Active Interventions: Assess ulceration(s) every visit Treatment Activities: Skin care regimen initiated : 04/15/2022 Topical wound management initiated : 04/15/2022 Notes: Electronic Signature(s) Signed: 06/01/2022 5:47:46 PM By: Sharyn Creamer RN, BSN Entered By: Sharyn Creamer on 06/01/2022 16:18:17 -------------------------------------------------------------------------------- Pain Assessment Details Patient Name: Date of Service: [name], Shaune Pascal ID M. 06/01/2022 3:30 PM Medical Record Number: [id number] Patient Account Number: [id number] Date of Birth/Sex: Treating RN: [birth year] (87 y.o. [name] Primary Care Blakely Maranan: [name] Other Clinician: Referring Velina Drollinger: Treating Laine Fonner/Extender: Celine Ahr in Treatment: 6 Active Problems Location of Pain Severity and Description of Pain Patient Has Paino No Site Locations Pain Management and Medication Current Pain Management: Electronic Signature(s) Signed: 06/01/2022 5:47:46 PM By: Sharyn Creamer RN, BSN Entered By: Sharyn Creamer on 06/01/2022 16:00:04 -------------------------------------------------------------------------------- Patient/Caregiver Education Details Patient Name: Date of Service: DA Burgess Estelle 10/4/2023andnbsp3:30 PM Medical Record Number: [id number] Patient Account Number: [id number] Date of Birth/Gender: Treating RN: 1936/05/11 (86 y.o. [name] Primary Care Physician: [name] Other Clinician: Referring Physician: Treating Physician/Extender: Celine Ahr in Treatment: 6 Education Assessment Education Provided To: Patient Education Topics Provided Wound/Skin Impairment: Methods: Explain/Verbal Responses: State content correctly Electronic Signature(s) Signed: 06/01/2022 5:47:46 PM By: Sharyn Creamer RN, BSN Entered By: Sharyn Creamer on 06/01/2022  16:18:31 -------------------------------------------------------------------------------- Wound Assessment Details Patient Name: Date of Service: [name] ID M. 06/01/2022 3:30 PM Medical Record Number: [id number] Patient Account Number: [id number] Date of Birth/Sex: Treating RN: July 21, 1936 (  86 y.o. [name] Primary Care Trevion Hoben: [name] Other Clinician: Referring Trinitey Roache: Treating Shailah Gibbins/Extender: Corliss Marcus Weeks in Treatment: 6 Wound Status Wound Number: 14 Primary Venous Leg Ulcer Etiology: Wound Location: Right, Circumferential Lower Leg Secondary Diabetic Wound/Ulcer of the Lower Extremity Wounding Event: Blister Etiology: Date Acquired: 05/09/2022 Wound Open Weeks Of Treatment: 1 Status: Clustered Wound: No Comorbid Cataracts, Glaucoma, Chronic Obstructive Pulmonary Disease History: (COPD), Arrhythmia, Congestive Heart Failure, Coronary Artery Disease, Hypertension, Myocardial Infarction, Type II Diabetes, Gout, Neuropathy Photos Wound Measurements Length: (cm) 15.5 Width: (cm) 24 Depth: (cm) 0.1 Area: (cm) 292.168 Volume: (cm) 29.217 % Reduction in Area: 31.8% % Reduction in Volume: 31.8% Epithelialization: Small (1-33%) Tunneling: No Undermining: No Wound Description Classification: Full Thickness Without Exposed Support Structures Wound Margin: Indistinct, nonvisible Exudate Amount: Large Exudate Type: Serous Exudate Color: amber Foul Odor After Cleansing: No Slough/Fibrino Yes Wound Bed Granulation Amount: Large (67-100%) Exposed Structure Granulation Quality: Red Fascia Exposed: No Necrotic Amount: Small (1-33%) Fat Layer (Subcutaneous Tissue) Exposed: Yes Necrotic Quality: Adherent Slough Tendon Exposed: No Muscle Exposed: No Joint Exposed: No Bone Exposed: No Periwound Skin Texture Texture Color No Abnormalities Noted: Yes No Abnormalities Noted: Yes Moisture Temperature / Pain No  Abnormalities Noted: Yes Temperature: No Abnormality Treatment Notes Wound #14 (Lower Leg) Wound Laterality: Right, Circumferential Cleanser Peri-Wound Care Sween Lotion (Moisturizing lotion) Discharge Instruction: Apply moisturizing lotion as directed Topical Primary Dressing KerraCel Ag Gelling Fiber Dressing, 4x5 in (silver alginate) Discharge Instruction: Apply silver alginate to wound bed as instructed Secondary Dressing ABD Pad, 8x10 Discharge Instruction: Apply over primary dressing as directed. Zetuvit Plus 4x8 in Discharge Instruction: Apply over primary dressing as directed. Secured With Compression Wrap Kerlix Roll 4.5x3.1 (in/yd) Discharge Instruction: Apply Kerlix and Coban compression as directed. Coban Self-Adherent Wrap 4x5 (in/yd) Discharge Instruction: Apply over Kerlix as directed. Compression Stockings Add-Ons Electronic Signature(s) Signed: 06/01/2022 5:47:46 PM By: Sharyn Creamer RN, BSN Entered By: Sharyn Creamer on 06/01/2022 16:07:52 -------------------------------------------------------------------------------- Wound Assessment Details Patient Name: Date of Service: [name], Shaune Pascal ID M. 06/01/2022 3:30 PM Medical Record Number: [id number] Patient Account Number: [id number] Date of Birth/Sex: Treating RN: 10/07/1935 (86 y.o. [name] Primary Care Analaya Hoey: [name] Other Clinician: Referring Chanele Douglas: Treating Shivank Pinedo/Extender: Corliss Marcus Weeks in Treatment: 6 Wound Status Wound Number: 15 Primary Venous Leg Ulcer Etiology: Wound Location: Left, Anterior Lower Leg Secondary Diabetic Wound/Ulcer of the Lower Extremity Wounding Event: Blister Etiology: Date Acquired: 05/09/2022 Wound Open Weeks Of Treatment: 1 Status: Clustered Wound: No Comorbid Cataracts, Glaucoma, Chronic Obstructive Pulmonary Disease History: (COPD), Arrhythmia, Congestive Heart Failure, Coronary Artery Disease, Hypertension, Myocardial  Infarction, Type II Diabetes, Gout, Neuropathy Photos Wound Measurements Length: (cm) 3 Width: (cm) 4.2 Depth: (cm) 0.1 Area: (cm) 9.896 Volume: (cm) 0.99 % Reduction in Area: 32.1% % Reduction in Volume: 32.1% Epithelialization: Small (1-33%) Tunneling: No Undermining: No Wound Description Classification: Full Thickness Without Exposed Support Structures Wound Margin: Flat and Intact Exudate Amount: Medium Exudate Type: Serous Exudate Color: amber Foul Odor After Cleansing: No Slough/Fibrino Yes Wound Bed Granulation Amount: Large (67-100%) Exposed Structure Granulation Quality: Red Fascia Exposed: No Necrotic Amount: Small (1-33%) Fat Layer (Subcutaneous Tissue) Exposed: Yes Necrotic Quality: Adherent Slough Tendon Exposed: No Muscle Exposed: No Joint Exposed: No Bone Exposed: No Periwound Skin Texture Texture Color No Abnormalities Noted: Yes No Abnormalities Noted: Yes Moisture Temperature / Pain No Abnormalities Noted: Yes Temperature: No Abnormality Treatment Notes Wound #15 (Lower Leg) Wound Laterality: Left, Anterior Cleanser Peri-Wound  Care Sween Lotion (Moisturizing lotion) Discharge Instruction: Apply moisturizing lotion as directed Topical Primary Dressing KerraCel Ag Gelling Fiber Dressing, 4x5 in (silver alginate) Discharge Instruction: Apply silver alginate to wound bed as instructed Secondary Dressing ABD Pad, 8x10 Discharge Instruction: Apply over primary dressing as directed. Zetuvit Plus 4x8 in Discharge Instruction: Apply over primary dressing as directed. Secured With Compression Wrap Kerlix Roll 4.5x3.1 (in/yd) Discharge Instruction: Apply Kerlix and Coban compression as directed. Coban Self-Adherent Wrap 4x5 (in/yd) Discharge Instruction: Apply over Kerlix as directed. Compression Stockings Add-Ons Electronic Signature(s) Signed: 06/01/2022 5:47:46 PM By: Sharyn Creamer RN, BSN Entered By: Sharyn Creamer on 06/01/2022  16:08:19 -------------------------------------------------------------------------------- Wound Assessment Details Patient Name: Date of Service: [name], Shaune Pascal ID M. 06/01/2022 3:30 PM Medical Record Number: [id number] Patient Account Number: [id number] Date of Birth/Sex: Treating RN: [birth year] (86 y.o. [name] Primary Care Shronda Boeh: [name] Other Clinician: Referring Fantasy Donald: Treating Emmery Seiler/Extender: Corliss Marcus Weeks in Treatment: 6 Wound Status Wound Number: 16 Primary Venous Leg Ulcer Etiology: Wound Location: Left, Lateral Lower Leg Secondary Diabetic Wound/Ulcer of the Lower Extremity Wounding Event: Blister Etiology: Date Acquired: 05/09/2022 Wound Open Weeks Of Treatment: 1 Status: Clustered Wound: Yes Comorbid Cataracts, Glaucoma, Chronic Obstructive Pulmonary Disease History: (COPD), Arrhythmia, Congestive Heart Failure, Coronary Artery Disease, Hypertension, Myocardial Infarction, Type II Diabetes, Gout, Neuropathy Photos Wound Measurements Length: (cm) 5 Width: (cm) 3.5 Depth: (cm) 0.1 Clustered Quantity: 2 Area: (cm) 13.744 Volume: (cm) 1.374 % Reduction in Area: 59.5% % Reduction in Volume: 59.5% Epithelialization: Small (1-33%) Tunneling: No Undermining: No Wound Description Classification: Full Thickness Without Exposed Support Structures Wound Margin: Flat and Intact Exudate Amount: Medium Exudate Type: Serous Exudate Color: amber Foul Odor After Cleansing: No Slough/Fibrino Yes Wound Bed Granulation Amount: Large (67-100%) Exposed Structure Granulation Quality: Red Fascia Exposed: No Necrotic Amount: Small (1-33%) Fat Layer (Subcutaneous Tissue) Exposed: Yes Necrotic Quality: Adherent Slough Tendon Exposed: No Muscle Exposed: No Joint Exposed: No Bone Exposed: No Periwound Skin Texture Texture Color No Abnormalities Noted: Yes No Abnormalities Noted: Yes Moisture Temperature / Pain No  Abnormalities Noted: Yes Temperature: No Abnormality Treatment Notes Wound #16 (Lower Leg) Wound Laterality: Left, Lateral Cleanser Peri-Wound Care Sween Lotion (Moisturizing lotion) Discharge Instruction: Apply moisturizing lotion as directed Topical Primary Dressing KerraCel Ag Gelling Fiber Dressing, 4x5 in (silver alginate) Discharge Instruction: Apply silver alginate to wound bed as instructed Secondary Dressing ABD Pad, 8x10 Discharge Instruction: Apply over primary dressing as directed. Zetuvit Plus 4x8 in Discharge Instruction: Apply over primary dressing as directed. Secured With Compression Wrap Kerlix Roll 4.5x3.1 (in/yd) Discharge Instruction: Apply Kerlix and Coban compression as directed. Coban Self-Adherent Wrap 4x5 (in/yd) Discharge Instruction: Apply over Kerlix as directed. Compression Stockings Add-Ons Electronic Signature(s) Signed: 06/01/2022 5:47:46 PM By: Sharyn Creamer RN, BSN Entered By: Sharyn Creamer on 06/01/2022 16:08:46 -------------------------------------------------------------------------------- Wound Assessment Details Patient Name: Date of Service: [name], Shaune Pascal ID M. 06/01/2022 3:30 PM Medical Record Number: [id number] Patient Account Number: [id number] Date of Birth/Sex: Treating RN: Jul 30, 1936 (86 y.o. [name] Primary Care Kaz Auld: [name] Other Clinician: Referring Somtochukwu Woollard: Treating Chanin Frumkin/Extender: Corliss Marcus Weeks in Treatment: 6 Wound Status Wound Number: 17 Primary Venous Leg Ulcer Etiology: Wound Location: Left, Medial Lower Leg Secondary Diabetic Wound/Ulcer of the Lower Extremity Wounding Event: Blister Etiology: Date Acquired: 05/09/2022 Wound Open Weeks Of Treatment: 1 Status: Clustered Wound: Yes Comorbid Cataracts, Glaucoma, Chronic Obstructive Pulmonary Disease History: (COPD), Arrhythmia, Congestive Heart Failure, Coronary Artery Disease, Hypertension, Myocardial  Infarction,  Type II Diabetes, Gout, Neuropathy Photos Wound Measurements Length: (cm) 3 Width: (cm) 4 Depth: (cm) 0.1 Clustered Quantity: 6 Area: (cm) 9.425 Volume: (cm) 0.942 % Reduction in Area: 50.6% % Reduction in Volume: 50.7% Epithelialization: Small (1-33%) Tunneling: No Undermining: No Wound Description Classification: Full Thickness Without Exposed Support Structures Wound Margin: Flat and Intact Exudate Amount: Medium Exudate Type: Serous Exudate Color: amber Foul Odor After Cleansing: No Slough/Fibrino Yes Wound Bed Granulation Amount: Large (67-100%) Exposed Structure Granulation Quality: Red Fascia Exposed: No Necrotic Amount: Small (1-33%) Fat Layer (Subcutaneous Tissue) Exposed: Yes Necrotic Quality: Adherent Slough Tendon Exposed: No Muscle Exposed: No Joint Exposed: No Bone Exposed: No Periwound Skin Texture Texture Color No Abnormalities Noted: Yes No Abnormalities Noted: Yes Moisture Temperature / Pain No Abnormalities Noted: Yes Temperature: No Abnormality Treatment Notes Wound #17 (Lower Leg) Wound Laterality: Left, Medial Cleanser Peri-Wound Care Sween Lotion (Moisturizing lotion) Discharge Instruction: Apply moisturizing lotion as directed Topical Primary Dressing KerraCel Ag Gelling Fiber Dressing, 4x5 in (silver alginate) Discharge Instruction: Apply silver alginate to wound bed as instructed Secondary Dressing ABD Pad, 8x10 Discharge Instruction: Apply over primary dressing as directed. Zetuvit Plus 4x8 in Discharge Instruction: Apply over primary dressing as directed. Secured With Compression Wrap Kerlix Roll 4.5x3.1 (in/yd) Discharge Instruction: Apply Kerlix and Coban compression as directed. Coban Self-Adherent Wrap 4x5 (in/yd) Discharge Instruction: Apply over Kerlix as directed. Compression Stockings Add-Ons Electronic Signature(s) Signed: 06/01/2022 5:47:46 PM By: Sharyn Creamer RN, BSN Entered By: Sharyn Creamer on  06/01/2022 16:09:12 -------------------------------------------------------------------------------- [facility] Details Patient Name: Date of Service: [name], [name] 06/01/2022 3:30 PM Medical Record Number: [id number] Patient Account Number: [id number] Date of Birth/Sex: Treating RN: December 25, 1935 (86 y.o. [name] Primary Care Torrie Lafavor: [name] Other Clinician: Referring Danelle Curiale: Treating Graycie Halley/Extender: Celine Ahr in Treatment: 6 Vital Signs Time Taken: 15:50 Temperature (F): 98.0 Height (in): 69 Pulse (bpm): 101 Weight (lbs): 175 Respiratory Rate (breaths/min): 18 Body Mass Index (BMI): 25.8 Blood Pressure (mmHg): 119/74 Capillary Blood Glucose (mg/dl): 92 Reference Range: 80 - 120 mg / dl Electronic Signature(s) Signed: 06/01/2022 5:47:46 PM By: Sharyn Creamer RN, BSN Entered By: Sharyn Creamer on 06/01/2022 15:59:56

## 2022-06-01 NOTE — Progress Notes (Signed)
Vincent [name], Vincent M. ([id number]) Visit Report for 06/01/2022 Chief Complaint Document Details Patient Name: Date of Service: Vincent Vincent [name] ID M. 06/01/2022 3:30 PM Medical Record Number: [id number] Patient Account Number: [id number] Date of Birth/Sex: Treating RN: 13-Oct-1935 (86 y.o. M) Primary Care Provider: [name] Other Clinician: Referring Provider: Treating Provider/Extender: Celine Ahr in Treatment: 6 Information Obtained from: Patient Chief Complaint 07/15/2021 patient is here for wounds on his bilateral lower legs 04/15/2022: The patient returns to clinic with new bilateral leg wounds. 05/24/2022: Back with new bilateral leg wounds Electronic Signature(s) Signed: 06/01/2022 4:52:01 PM By: Fredirick Maudlin MD FACS Entered By: Fredirick Maudlin on 06/01/2022 16:52:01 -------------------------------------------------------------------------------- Debridement Details Patient Name: Date of Service: Vincent Vincent [name], Vincent V ID M. 06/01/2022 3:30 PM Medical Record Number: [id number] Patient Account Number: [id number] Date of Birth/Sex: Treating RN: 1935-10-21 (86 y.o. [name] Primary Care Provider: [name] Other Clinician: Referring Provider: Treating Provider/Extender: Celine Ahr in Treatment: 6 Debridement Performed for Assessment: Wound #14 Right,Circumferential Lower Leg Performed By: Physician Fredirick Maudlin, MD Debridement Type: Debridement Severity of Tissue Pre Debridement: Fat layer exposed Level of Consciousness (Pre-procedure): Awake and Alert Pre-procedure Verification/Time Out Yes - 16:14 Taken: Start Time: 16:14 Pain Control: Lidocaine 5% topical ointment T Area Debrided (L x W): otal 15.5 (cm) x 20 (cm) = 310 (cm) Tissue and other material debrided: Non-Viable, Eschar, Slough, Slough Level: Non-Viable Tissue Debridement Description: Selective/Open Wound Instrument: Curette Bleeding:  Minimum Hemostasis Achieved: Pressure Procedural Pain: 0 Post Procedural Pain: 0 Response to Treatment: Procedure was tolerated well Level of Consciousness (Post- Awake and Alert procedure): Post Debridement Measurements of Total Wound Length: (cm) 15.5 Width: (cm) 24 Depth: (cm) 0.1 Volume: (cm) 29.217 Character of Wound/Ulcer Post Debridement: Improved Severity of Tissue Post Debridement: Fat layer exposed Post Procedure Diagnosis Same as Pre-procedure Electronic Signature(s) Signed: 06/01/2022 4:56:11 PM By: Fredirick Maudlin MD FACS Signed: 06/01/2022 5:47:46 PM By: Sharyn Creamer RN, BSN Entered By: Sharyn Creamer on 06/01/2022 16:15:44 -------------------------------------------------------------------------------- Debridement Details Patient Name: Date of Service: Vincent Vincent [name], Vincent V ID M. 06/01/2022 3:30 PM Medical Record Number: [id number] Patient Account Number: [id number] Date of Birth/Sex: Treating RN: [birth year] (86 y.o. [name] Primary Care Provider: [name] Other Clinician: Referring Provider: Treating Provider/Extender: Celine Ahr in Treatment: 6 Debridement Performed for Assessment: Wound #15 Left,Anterior Lower Leg Performed By: Physician Fredirick Maudlin, MD Debridement Type: Debridement Severity of Tissue Pre Debridement: Fat layer exposed Level of Consciousness (Pre-procedure): Awake and Alert Pre-procedure Verification/Time Out Yes - 16:14 Taken: Start Time: 16:14 Pain Control: Lidocaine 5% topical ointment T Area Debrided (L x W): otal 3 (cm) x 4.2 (cm) = 12.6 (cm) Tissue and other material debrided: Non-Viable, Eschar, Slough, Slough Level: Non-Viable Tissue Debridement Description: Selective/Open Wound Instrument: Curette Bleeding: Minimum Hemostasis Achieved: Pressure Procedural Pain: 0 Post Procedural Pain: 0 Response to Treatment: Procedure was tolerated well Level of Consciousness (Post- Awake and  Alert procedure): Post Debridement Measurements of Total Wound Length: (cm) 3 Width: (cm) 4.2 Depth: (cm) 0.1 Volume: (cm) 0.99 Character of Wound/Ulcer Post Debridement: Improved Severity of Tissue Post Debridement: Fat layer exposed Post Procedure Diagnosis Same as Pre-procedure Electronic Signature(s) Signed: 06/01/2022 4:56:11 PM By: Fredirick Maudlin MD FACS Signed: 06/01/2022 5:47:46 PM By: Sharyn Creamer RN, BSN Entered By: Sharyn Creamer on 06/01/2022 16:16:31 -------------------------------------------------------------------------------- Debridement Details Patient Name: Date of Service: Vincent Vincent [name], Vincent V ID M. 06/01/2022 3:30 PM Medical Record Number: [id number] Patient Account Number: [id number]  Date of Birth/Sex: Treating RN: June 22, 1936 (86 y.o. [name] Primary Care Provider: [name] Other Clinician: Referring Provider: Treating Provider/Extender: Celine Ahr in Treatment: 6 Debridement Performed for Assessment: Wound #16 Left,Lateral Lower Leg Performed By: Physician Fredirick Maudlin, MD Debridement Type: Debridement Severity of Tissue Pre Debridement: Fat layer exposed Level of Consciousness (Pre-procedure): Awake and Alert Pre-procedure Verification/Time Out Yes - 16:14 Taken: Start Time: 16:14 Pain Control: Lidocaine 5% topical ointment T Area Debrided (L x W): otal 5 (cm) x 3.5 (cm) = 17.5 (cm) Tissue and other material debrided: Non-Viable, Eschar, Slough, Slough Level: Non-Viable Tissue Debridement Description: Selective/Open Wound Instrument: Curette Bleeding: Minimum Hemostasis Achieved: Pressure Procedural Pain: 0 Post Procedural Pain: 0 Response to Treatment: Procedure was tolerated well Level of Consciousness (Post- Awake and Alert procedure): Post Debridement Measurements of Total Wound Length: (cm) 5 Width: (cm) 3.5 Depth: (cm) 0.1 Volume: (cm) 1.374 Character of Wound/Ulcer Post Debridement:  Improved Severity of Tissue Post Debridement: Fat layer exposed Post Procedure Diagnosis Same as Pre-procedure Electronic Signature(s) Signed: 06/01/2022 4:56:11 PM By: Fredirick Maudlin MD FACS Signed: 06/01/2022 5:47:46 PM By: Sharyn Creamer RN, BSN Entered By: Sharyn Creamer on 06/01/2022 16:17:14 -------------------------------------------------------------------------------- Debridement Details Patient Name: Date of Service: Vincent Vincent [name], Vincent V ID M. 06/01/2022 3:30 PM Medical Record Number: [id number] Patient Account Number: [id number] Date of Birth/Sex: Treating RN: 1936-03-10 (86 y.o. [name] Primary Care Provider: [name] Other Clinician: Referring Provider: Treating Provider/Extender: Celine Ahr in Treatment: 6 Debridement Performed for Assessment: Wound #17 Left,Medial Lower Leg Performed By: Physician Fredirick Maudlin, MD Debridement Type: Debridement Severity of Tissue Pre Debridement: Fat layer exposed Level of Consciousness (Pre-procedure): Awake and Alert Pre-procedure Verification/Time Out Yes - 16:14 Taken: Start Time: 16:14 Pain Control: Lidocaine 5% topical ointment T Area Debrided (L x W): otal 3 (cm) x 4 (cm) = 12 (cm) Tissue and other material debrided: Non-Viable, Eschar, Slough, Slough Level: Non-Viable Tissue Debridement Description: Selective/Open Wound Instrument: Curette Bleeding: Minimum Hemostasis Achieved: Pressure Procedural Pain: 0 Post Procedural Pain: 0 Response to Treatment: Procedure was tolerated well Level of Consciousness (Post- Awake and Alert procedure): Post Debridement Measurements of Total Wound Length: (cm) 3 Width: (cm) 4 Depth: (cm) 0.1 Volume: (cm) 0.942 Character of Wound/Ulcer Post Debridement: Improved Severity of Tissue Post Debridement: Fat layer exposed Post Procedure Diagnosis Same as Pre-procedure Electronic Signature(s) Signed: 06/01/2022 4:56:11 PM By: Fredirick Maudlin MD  FACS Signed: 06/01/2022 5:47:46 PM By: Sharyn Creamer RN, BSN Entered By: Sharyn Creamer on 06/01/2022 16:17:53 -------------------------------------------------------------------------------- HPI Details Patient Name: Date of Service: Vincent [name], Vincent V ID M. 06/01/2022 3:30 PM Medical Record Number: [id number] Patient Account Number: [id number] Date of Birth/Sex: Treating RN: 1936/05/18 (86 y.o. M) Primary Care Provider: [name] Other Clinician: Referring Provider: Treating Provider/Extender: Celine Ahr in Treatment: 6 History of Present Illness HPI Description: ADMISSION 07/15/2021 This is an 86 year old man who came here for evaluation of wounds on his bilateral predominantly anterior lower legs mid aspect of the tibia. He seems to have been dealing with this for most of this year. Currently with 3 wounds on the right and 2 on the left. At least some of these seem to start off as blisters probably the majority. He has been following with Dr. Anabel Bene of [facility] dermatology according the patient back he has an appointment Dr. Anabel Bene tomorrow he was given a prescription for triamcinolone which she has been applying daily in fact it was renewed yesterday. Patient  is a diabetic no recent hemoglobin A1c. He is on Coumadin for persistent atrial fibrillation. Vein and vascular in December 2021 at which time he had bilateral foot pain and finger pain. ABIs bilaterally were noncompressible however the TBI on the right was only 0.39. There were biphasic waveforms on the left his ABI was noncompressible with biphasic waveforms but with a TBI of 0.6. He was not felt to have a primary vascular issue. There is a mention of Raynaud's phenomenon as well. Past medical history includes cellulitis of the right leg in June 2022, type 2 diabetes, persistent lower extremity edema, bilateral foot and ankle pain, Raynaud's phenomenon, coronary artery disease, congestive heart  failure, pacemaker, none sustained ventricular tachycardia, remote CVA paroxysmal atrial fib on Coumadin. We did not reattempt ABIs in our clinic 07/22/2019 patient appears to be doing decently well in regard to his wounds as compared to last week with the wounds that were present last week. Fortunately I do not see any signs of active infection which is great news. No fevers, chills, nausea, vomiting, or diarrhea. With that being said he has several new blisters that are getting need to be addressed today. 07/28/2021 upon evaluation today patient actually appears to be making excellent progress. I am extremely pleased with where we stand today. I think that his wounds are significantly improved compared to where they were previous. 08/04/2021 upon evaluation today patient appears to be doing much better in regard to his wounds in general. I feel like across the board he is showing signs of significant improvement. Fortunately there is no evidence of infection which is great news. No fevers, chills, nausea, vomiting, or diarrhea. 08/11/2021 upon evaluation today patient actually appears to be doing quite well in regard to his wound. Has been tolerating the dressing changes without complication. Fortunately I do not see any signs of infection currently which is great news as well. No fevers, chills, nausea, vomiting, or diarrhea. 08/18/2021 on evaluation today patient appears to be doing excellent he is making good progress and overall I am extremely pleased with where we stand today. There does not appear to be any signs of active infection at this time which is great news. 09/01/2020 upon evaluation patient's wounds on both the left and the right appear to be at 0.1 cm measurement and are very close to complete resolution. Were not quite completely done yet but this is very close. Overall I am extremely happy with where things stand and I think he is headed in the right direction. 09/08/2021 upon  evaluation today patient appears to be doing well in regard to his right leg which is showing signs of being completely healed which is excellent. Fortunately I do not see any evidence of active infection locally nor systemically at this point which is also great news. No fevers, chills, nausea, vomiting, or diarrhea. 09/15/2021; this is a patient we have been following for now a small wound on the left dorsal foot. He had wounds on the right legs and was discharged to 20/30 below-knee stockings apparently in late December. He came in today with marked increase in swelling in the right leg large areas of denuded skin which probably started as blisters. He did not have a stocking on 10/06/2021 upon evaluation today patient appears to be doing decently well in regard to his wounds. On the left leg he is completely healed on the right leg he does still have a small area that is open although again its not as good  as it was last time I saw him is also not as bad as it was in the beginning. He has been in the hospital and they did not wrap him as part of the issue I believe here as well. 10/20/2021 upon evaluation today patient appears to be doing well with regard to his leg ulcer. He has been tolerating the dressing changes without complication. Fortunately I do not see any signs of active infection locally or systemically at this point. No fevers, chills, nausea, vomiting, or diarrhea. 10/27/2021 upon evaluation today patient appears to be doing well with regard to his wound. In fact this appears to be almost completely healed which is great news. Fortunately I do not see any evidence of active infection locally nor systemically which is great news. No fevers, chills, nausea, vomiting, or diarrhea. 11/03/2021 upon evaluation today patient is very close to complete resolution he just has a very tiny area on the right leg still open at this point. He did go get socks from [employer] as I discussed with him at the last  visit unfortunately he actually got diabetic socks and compression socks. I explained those are not the same thing and he voiced understanding he is going to go to a different [facility] and try to find exactly what we are looking for. Fortunately I do not see any signs of infection and I think he is very close to discharge will probably be ready for next week. 11/10/2021: T oday the patient's right lower extremity wound is closed. He was able to get the low grade compression socks from [employer] and is wearing 1 on the left leg. READMISSION 04/15/2022 The patient returns to clinic today with new bilateral lower extremity leg wounds. He says that he has not been using moisturizer regularly and is not wearing his compression stockings. The wounds are superficial. He has eschar buildup on the surfaces and thickened dry, cracked skin. Under the eschar, there is a little bit of slough on the wound surfaces. 04/25/2022: All of the wounds are little bit smaller with just a little eschar on their surfaces. The right anterior tibial wound is closed. 05/04/2022: All of his wounds are healed. 05/24/2022: The patient returns today with extensive bilateral lower leg wounds. On the right, the wounds are essentially circumferential; on the left, they are clustered in 3 separate areas. There is slough accumulation, along with some eschar. He reports that his legs reopened shortly after his discharge from the clinic a couple of weeks ago. He says that he was unable to apply his compression stockings. He subsequently purchased a stocking donner but the compression socks that he has are only 8 to 15 mmHg compression. 06/01/2022: There has been dramatic improvement in his wounds over the week. There has been substantial epithelialization. He still has circumferential wounds, but the open areas are smaller. There is a little slough and eschar accumulation on all the surfaces. Edema control is good. Electronic  Signature(s) Signed: 06/01/2022 4:52:54 PM By: Fredirick Maudlin MD FACS Entered By: Fredirick Maudlin on 06/01/2022 16:52:54 -------------------------------------------------------------------------------- Physical Exam Details Patient Name: Date of Service: Vincent Vincent [name], Vincent V ID M. 06/01/2022 3:30 PM Medical Record Number: [id number] Patient Account Number: [id number] Date of Birth/Sex: Treating RN: [birth year] (86 y.o. M) Primary Care Provider: [name] Other Clinician: Referring Provider: Treating Provider/Extender: Jolaine Artist, Karrar Weeks in Treatment: 6 Constitutional . Slightly tachycardic, asymptomatic. . . No acute distress.Marland Kitchen Respiratory Normal work of breathing on room air.. Notes 06/01/2022: There has been  dramatic improvement in his wounds over the week. There has been substantial epithelialization. He still has circumferential wounds, but the open areas are smaller. There is a little slough and eschar accumulation on all the surfaces. Edema control is good. Electronic Signature(s) Signed: 06/01/2022 4:53:56 PM By: Fredirick Maudlin MD FACS Previous Signature: 06/01/2022 4:53:32 PM Version By: Fredirick Maudlin MD FACS Entered By: Fredirick Maudlin on 06/01/2022 16:53:56 -------------------------------------------------------------------------------- Physician Orders Details Patient Name: Date of Service: Vincent Vincent [name], Vincent V ID M. 06/01/2022 3:30 PM Medical Record Number: [id number] Patient Account Number: [id number] Date of Birth/Sex: Treating RN: 01-02-1936 (86 y.o. [name] Primary Care Provider: [name] Other Clinician: Referring Provider: Treating Provider/Extender: Celine Ahr in Treatment: 6 Verbal / Phone Orders: No Diagnosis Coding ICD-10 Coding Code Description [phone] Non-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 leg limited to breakdown of skin L97.811 Non-pressure chronic ulcer of other part of right  lower leg limited to breakdown of skin I87.2 Venous insufficiency (chronic) (peripheral) I50.9 Heart failure, unspecified E11.622 Type 2 diabetes mellitus with other skin ulcer Follow-up Appointments ppointment in 1 week. - Dr. Celine Ahr RM 1 Return A Bathing/ Shower/ Hygiene May shower with protection but do not get wound dressing(s) wet. Edema Control - Lymphedema / SCD / Other Elevate legs to the level of the heart or above for 30 minutes daily and/or when sitting, a frequency of: - whenever sitting throughout the day Avoid standing for long periods of time. Exercise regularly Wound Treatment Wound #14 - Lower Leg Wound Laterality: Right, Circumferential Peri-Wound Care: Sween Lotion (Moisturizing lotion) 1 x Per Week Discharge Instructions: Apply moisturizing lotion as directed Prim Dressing: KerraCel Ag Gelling Fiber Dressing, 4x5 in (silver alginate) 1 x Per Week ary Discharge Instructions: Apply silver alginate to wound bed as instructed Secondary Dressing: ABD Pad, 8x10 1 x Per Week Discharge Instructions: Apply over primary dressing as directed. Secondary Dressing: Zetuvit Plus 4x8 in 1 x Per Week Discharge Instructions: Apply over primary dressing as directed. Compression Wrap: Kerlix Roll 4.5x3.1 (in/yd) 1 x Per Week Discharge Instructions: Apply Kerlix and Coban compression as directed. Compression Wrap: Coban Self-Adherent Wrap 4x5 (in/yd) 1 x Per Week Discharge Instructions: Apply over Kerlix as directed. Wound #15 - Lower Leg Wound Laterality: Left, Anterior Peri-Wound Care: Sween Lotion (Moisturizing lotion) 1 x Per Week Discharge Instructions: Apply moisturizing lotion as directed Prim Dressing: KerraCel Ag Gelling Fiber Dressing, 4x5 in (silver alginate) 1 x Per Week ary Discharge Instructions: Apply silver alginate to wound bed as instructed Secondary Dressing: ABD Pad, 8x10 1 x Per Week Discharge Instructions: Apply over primary dressing as directed. Secondary  Dressing: Zetuvit Plus 4x8 in 1 x Per Week Discharge Instructions: Apply over primary dressing as directed. Compression Wrap: Kerlix Roll 4.5x3.1 (in/yd) 1 x Per Week Discharge Instructions: Apply Kerlix and Coban compression as directed. Compression Wrap: Coban Self-Adherent Wrap 4x5 (in/yd) 1 x Per Week Discharge Instructions: Apply over Kerlix as directed. Wound #16 - Lower Leg Wound Laterality: Left, Lateral Peri-Wound Care: Sween Lotion (Moisturizing lotion) 1 x Per Week Discharge Instructions: Apply moisturizing lotion as directed Prim Dressing: KerraCel Ag Gelling Fiber Dressing, 4x5 in (silver alginate) 1 x Per Week ary Discharge Instructions: Apply silver alginate to wound bed as instructed Secondary Dressing: ABD Pad, 8x10 1 x Per Week Discharge Instructions: Apply over primary dressing as directed. Secondary Dressing: Zetuvit Plus 4x8 in 1 x Per Week Discharge Instructions: Apply over primary dressing as directed. Compression Wrap: Kerlix Roll 4.5x3.1 (  in/yd) 1 x Per Week Discharge Instructions: Apply Kerlix and Coban compression as directed. Compression Wrap: Coban Self-Adherent Wrap 4x5 (in/yd) 1 x Per Week Discharge Instructions: Apply over Kerlix as directed. Wound #17 - Lower Leg Wound Laterality: Left, Medial Peri-Wound Care: Sween Lotion (Moisturizing lotion) 1 x Per Week Discharge Instructions: Apply moisturizing lotion as directed Prim Dressing: KerraCel Ag Gelling Fiber Dressing, 4x5 in (silver alginate) 1 x Per Week ary Discharge Instructions: Apply silver alginate to wound bed as instructed Secondary Dressing: ABD Pad, 8x10 1 x Per Week Discharge Instructions: Apply over primary dressing as directed. Secondary Dressing: Zetuvit Plus 4x8 in 1 x Per Week Discharge Instructions: Apply over primary dressing as directed. Compression Wrap: Kerlix Roll 4.5x3.1 (in/yd) 1 x Per Week Discharge Instructions: Apply Kerlix and Coban compression as directed. Compression Wrap:  Coban Self-Adherent Wrap 4x5 (in/yd) 1 x Per Week Discharge Instructions: Apply over Kerlix as directed. Patient Medications llergies: No Known Allergies A Notifications Medication Indication Start End prior to debridement 06/01/2022 lidocaine DOSE topical 5 % ointment - ointment topical Electronic Signature(s) Signed: 06/01/2022 4:56:11 PM By: Fredirick Maudlin MD FACS Entered By: Fredirick Maudlin on 06/01/2022 16:54:18 -------------------------------------------------------------------------------- Problem List Details Patient Name: Date of Service: Vincent [name], Vincent V ID M. 06/01/2022 3:30 PM Medical Record Number: [id number] Patient Account Number: [id number] Date of Birth/Sex: Treating RN: 04/16/1936 (86 y.o. M) Primary Care Provider: [name] Other Clinician: Referring Provider: Treating Provider/Extender: Celine Ahr in Treatment: 6 Active Problems ICD-10 Encounter Code Description Active Date MDM Diagnosis [phone] Non-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 leg limited to breakdown 04/15/2022 No Yes of skin L97.811 Non-pressure chronic ulcer of other part of right lower leg limited to breakdown 04/15/2022 No Yes of skin I87.2 Venous insufficiency (chronic) (peripheral) 04/15/2022 No Yes I50.9 Heart failure, unspecified 04/15/2022 No Yes E11.622 Type 2 diabetes mellitus with other skin ulcer 04/15/2022 No Yes Inactive Problems Resolved Problems Electronic Signature(s) Signed: 06/01/2022 4:51:29 PM By: Fredirick Maudlin MD FACS Entered By: Fredirick Maudlin on 06/01/2022 16:51:29 -------------------------------------------------------------------------------- Progress Note Details Patient Name: Date of Service: Vincent [name], Vincent V ID M. 06/01/2022 3:30 PM Medical Record Number: [id number] Patient Account Number: [id number] Date of Birth/Sex: Treating RN: 03-02-1936 (86 y.o. M) Primary Care Provider: [name] Other Clinician: Referring  Provider: Treating Provider/Extender: Celine Ahr in Treatment: 6 Subjective Chief Complaint Information obtained from Patient 07/15/2021 patient is here for wounds on his bilateral lower legs 04/15/2022: The patient returns to clinic with new bilateral leg wounds. 05/24/2022: Back with new bilateral leg wounds History of Present Illness (HPI) ADMISSION 07/15/2021 This is an 86 year old man who came here for evaluation of wounds on his bilateral predominantly anterior lower legs mid aspect of the tibia. He seems to have been dealing with this for most of this year. Currently with 3 wounds on the right and 2 on the left. At least some of these seem to start off as blisters probably the majority. He has been following with Dr. Anabel Bene of [facility] dermatology according the patient back he has an appointment Dr. Anabel Bene tomorrow he was given a prescription for triamcinolone which she has been applying daily in fact it was renewed yesterday. Patient is a diabetic no recent hemoglobin A1c. He is on Coumadin for persistent atrial fibrillation. Vein and vascular in December 2021 at which time he had bilateral foot pain and finger pain. ABIs bilaterally were noncompressible however the TBI on the right was only 0.39. There were  biphasic waveforms on the left his ABI was noncompressible with biphasic waveforms but with a TBI of 0.6. He was not felt to have a primary vascular issue. There is a mention of Raynaud's phenomenon as well. Past medical history includes cellulitis of the right leg in June 2022, type 2 diabetes, persistent lower extremity edema, bilateral foot and ankle pain, Raynaud's phenomenon, coronary artery disease, congestive heart failure, pacemaker, none sustained ventricular tachycardia, remote CVA paroxysmal atrial fib on Coumadin. We did not reattempt ABIs in our clinic 07/22/2019 patient appears to be doing decently well in regard to his wounds as  compared to last week with the wounds that were present last week. Fortunately I do not see any signs of active infection which is great news. No fevers, chills, nausea, vomiting, or diarrhea. With that being said he has several new blisters that are getting need to be addressed today. 07/28/2021 upon evaluation today patient actually appears to be making excellent progress. I am extremely pleased with where we stand today. I think that his wounds are significantly improved compared to where they were previous. 08/04/2021 upon evaluation today patient appears to be doing much better in regard to his wounds in general. I feel like across the board he is showing signs of significant improvement. Fortunately there is no evidence of infection which is great news. No fevers, chills, nausea, vomiting, or diarrhea. 08/11/2021 upon evaluation today patient actually appears to be doing quite well in regard to his wound. Has been tolerating the dressing changes without complication. Fortunately I do not see any signs of infection currently which is great news as well. No fevers, chills, nausea, vomiting, or diarrhea. 08/18/2021 on evaluation today patient appears to be doing excellent he is making good progress and overall I am extremely pleased with where we stand today. There does not appear to be any signs of active infection at this time which is great news. 09/01/2020 upon evaluation patient's wounds on both the left and the right appear to be at 0.1 cm measurement and are very close to complete resolution. Were not quite completely done yet but this is very close. Overall I am extremely happy with where things stand and I think he is headed in the right direction. 09/08/2021 upon evaluation today patient appears to be doing well in regard to his right leg which is showing signs of being completely healed which is excellent. Fortunately I do not see any evidence of active infection locally nor systemically at  this point which is also great news. No fevers, chills, nausea, vomiting, or diarrhea. 09/15/2021; this is a patient we have been following for now a small wound on the left dorsal foot. He had wounds on the right legs and was discharged to 20/30 below-knee stockings apparently in late December. He came in today with marked increase in swelling in the right leg large areas of denuded skin which probably started as blisters. He did not have a stocking on 10/06/2021 upon evaluation today patient appears to be doing decently well in regard to his wounds. On the left leg he is completely healed on the right leg he does still have a small area that is open although again its not as good as it was last time I saw him is also not as bad as it was in the beginning. He has been in the hospital and they did not wrap him as part of the issue I believe here as well. 10/20/2021 upon evaluation today patient appears  to be doing well with regard to his leg ulcer. He has been tolerating the dressing changes without complication. Fortunately I do not see any signs of active infection locally or systemically at this point. No fevers, chills, nausea, vomiting, or diarrhea. 10/27/2021 upon evaluation today patient appears to be doing well with regard to his wound. In fact this appears to be almost completely healed which is great news. Fortunately I do not see any evidence of active infection locally nor systemically which is great news. No fevers, chills, nausea, vomiting, or diarrhea. 11/03/2021 upon evaluation today patient is very close to complete resolution he just has a very tiny area on the right leg still open at this point. He did go get socks from [place] as I discussed with him at the last visit unfortunately he actually got diabetic socks and compression socks. I explained those are not the same thing and he voiced understanding he is going to go to a different [facility] and try to find exactly what we are looking for.  Fortunately I do not see any signs of infection and I think he is very close to discharge will probably be ready for next week. 11/10/2021: T oday the patient's right lower extremity wound is closed. He was able to get the low grade compression socks from [employer] and is wearing 1 on the left leg. READMISSION 04/15/2022 The patient returns to clinic today with new bilateral lower extremity leg wounds. He says that he has not been using moisturizer regularly and is not wearing his compression stockings. The wounds are superficial. He has eschar buildup on the surfaces and thickened dry, cracked skin. Under the eschar, there is a little bit of slough on the wound surfaces. 04/25/2022: All of the wounds are little bit smaller with just a little eschar on their surfaces. The right anterior tibial wound is closed. 05/04/2022: All of his wounds are healed. 05/24/2022: The patient returns today with extensive bilateral lower leg wounds. On the right, the wounds are essentially circumferential; on the left, they are clustered in 3 separate areas. There is slough accumulation, along with some eschar. He reports that his legs reopened shortly after his discharge from the clinic a couple of weeks ago. He says that he was unable to apply his compression stockings. He subsequently purchased a stocking donner but the compression socks that he has are only 8 to 15 mmHg compression. 06/01/2022: There has been dramatic improvement in his wounds over the week. There has been substantial epithelialization. He still has circumferential wounds, but the open areas are smaller. There is a little slough and eschar accumulation on all the surfaces. Edema control is good. Patient History Information obtained from Patient, Chart. Family History Cancer - Mother,Siblings, Heart Disease - Father, Hypertension - Mother,Father, Thyroid Problems - Siblings, No family history of Diabetes, Hereditary Spherocytosis, Kidney Disease, Lung  Disease, Seizures, Stroke, Tuberculosis. Social History Former smoker, Marital Status - Divorced, Alcohol Use - Never, Drug Use - No History, Caffeine Use - Rarely - coffee. Medical History Eyes Patient has history of Cataracts - left eye, Glaucoma Denies history of Optic Neuritis Respiratory Patient has history of Chronic Obstructive Pulmonary Disease (COPD) Cardiovascular Patient has history of Arrhythmia - afib, Congestive Heart Failure, Coronary Artery Disease, Hypertension, Myocardial Infarction Endocrine Patient has history of Type II Diabetes Denies history of Type I Diabetes Genitourinary Denies history of End Stage Renal Disease Integumentary (Skin) Denies history of History of Burn Musculoskeletal Patient has history of Gout Neurologic Patient  has history of Neuropathy Oncologic Denies history of Received Chemotherapy, Received Radiation Psychiatric Denies history of Anorexia/bulimia, Confinement Anxiety Hospitalization/Surgery History - cardiac cath with stents. - colonoscopy. - knee surgery. Medical A Surgical History Notes nd Respiratory pulmonary eosinophilia Cardiovascular hyperlipidemia, alcoholic cardiomyopathy Gastrointestinal GERD Genitourinary incontinence, enlarged prostate Neurologic left foot drop, stroke Objective Constitutional Slightly tachycardic, asymptomatic. No acute distress.. Vitals Time Taken: 3:50 PM, Height: 69 in, Weight: 175 lbs, BMI: 25.8, Temperature: 98.0 F, Pulse: 101 bpm, Respiratory Rate: 18 breaths/min, Blood Pressure: 119/74 mmHg, Capillary Blood Glucose: 92 mg/dl. Respiratory Normal work of breathing on room air.. General Notes: 06/01/2022: There has been dramatic improvement in his wounds over the week. There has been substantial epithelialization. He still has circumferential wounds, but the open areas are smaller. There is a little slough and eschar accumulation on all the surfaces. Edema control is good. Integumentary  (Hair, Skin) Wound #14 status is Open. Original cause of wound was Blister. The date acquired was: 05/09/2022. The wound has been in treatment 1 weeks. The wound is located on the Right,Circumferential Lower Leg. The wound measures 15.5cm length x 24cm width x 0.1cm depth; 292.168cm^2 area and 29.217cm^3 volume. There is Fat Layer (Subcutaneous Tissue) exposed. There is no tunneling or undermining noted. There is a large amount of serous drainage noted. The wound margin is indistinct and nonvisible. There is large (67-100%) red granulation within the wound bed. There is a small (1-33%) amount of necrotic tissue within the wound bed including Adherent Slough. The periwound skin appearance had no abnormalities noted for texture. The periwound skin appearance had no abnormalities noted for moisture. The periwound skin appearance had no abnormalities noted for color. Periwound temperature was noted as No Abnormality. Wound #15 status is Open. Original cause of wound was Blister. The date acquired was: 05/09/2022. The wound has been in treatment 1 weeks. The wound is located on the Left,Anterior Lower Leg. The wound measures 3cm length x 4.2cm width x 0.1cm depth; 9.896cm^2 area and 0.99cm^3 volume. There is Fat Layer (Subcutaneous Tissue) exposed. There is no tunneling or undermining noted. There is a medium amount of serous drainage noted. The wound margin is flat and intact. There is large (67-100%) red granulation within the wound bed. There is a small (1-33%) amount of necrotic tissue within the wound bed including Adherent Slough. The periwound skin appearance had no abnormalities noted for texture. The periwound skin appearance had no abnormalities noted for moisture. The periwound skin appearance had no abnormalities noted for color. Periwound temperature was noted as No Abnormality. Wound #16 status is Open. Original cause of wound was Blister. The date acquired was: 05/09/2022. The wound has been in  treatment 1 weeks. The wound is located on the Left,Lateral Lower Leg. The wound measures 5cm length x 3.5cm width x 0.1cm depth; 13.744cm^2 area and 1.374cm^3 volume. There is Fat Layer (Subcutaneous Tissue) exposed. There is no tunneling or undermining noted. There is a medium amount of serous drainage noted. The wound margin is flat and intact. There is large (67-100%) red granulation within the wound bed. There is a small (1-33%) amount of necrotic tissue within the wound bed including Adherent Slough. The periwound skin appearance had no abnormalities noted for texture. The periwound skin appearance had no abnormalities noted for moisture. The periwound skin appearance had no abnormalities noted for color. Periwound temperature was noted as No Abnormality. Wound #17 status is Open. Original cause of wound was Blister. The date acquired was: 05/09/2022. The wound has been  in treatment 1 weeks. The wound is located on the Left,Medial Lower Leg. The wound measures 3cm length x 4cm width x 0.1cm depth; 9.425cm^2 area and 0.942cm^3 volume. There is Fat Layer (Subcutaneous Tissue) exposed. There is no tunneling or undermining noted. There is a medium amount of serous drainage noted. The wound margin is flat and intact. There is large (67-100%) red granulation within the wound bed. There is a small (1-33%) amount of necrotic tissue within the wound bed including Adherent Slough. The periwound skin appearance had no abnormalities noted for texture. The periwound skin appearance had no abnormalities noted for moisture. The periwound skin appearance had no abnormalities noted for color. Periwound temperature was noted as No Abnormality. Assessment Active Problems ICD-10 Non-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 leg limited to breakdown of skin Non-pressure chronic ulcer of other part of right lower leg limited to breakdown of skin Venous insufficiency (chronic) (peripheral) Heart failure,  unspecified Type 2 diabetes mellitus with other skin ulcer Procedures Wound #14 Pre-procedure diagnosis of Wound #14 is a Venous Leg Ulcer located on the Right,Circumferential Lower Leg .Severity of Tissue Pre Debridement is: Fat layer exposed. There was a Selective/Open Wound Non-Viable Tissue Debridement with a total area of 310 sq cm performed by Fredirick Maudlin, MD. With the following instrument(s): Curette to remove Non-Viable tissue/material. Material removed includes Eschar and Slough and after achieving pain control using Lidocaine 5% topical ointment. No specimens were taken. A time out was conducted at 16:14, prior to the start of the procedure. A Minimum amount of bleeding was controlled with Pressure. The procedure was tolerated well with a pain level of 0 throughout and a pain level of 0 following the procedure. Post Debridement Measurements: 15.5cm length x 24cm width x 0.1cm depth; 29.217cm^3 volume. Character of Wound/Ulcer Post Debridement is improved. Severity of Tissue Post Debridement is: Fat layer exposed. Post procedure Diagnosis Wound #14: Same as Pre-Procedure Wound #15 Pre-procedure diagnosis of Wound #15 is a Venous Leg Ulcer located on the Left,Anterior Lower Leg .Severity of Tissue Pre Debridement is: Fat layer exposed. There was a Selective/Open Wound Non-Viable Tissue Debridement with a total area of 12.6 sq cm performed by Fredirick Maudlin, MD. With the following instrument(s): Curette to remove Non-Viable tissue/material. Material removed includes Eschar and Slough and after achieving pain control using Lidocaine 5% topical ointment. No specimens were taken. A time out was conducted at 16:14, prior to the start of the procedure. A Minimum amount of bleeding was controlled with Pressure. The procedure was tolerated well with a pain level of 0 throughout and a pain level of 0 following the procedure. Post Debridement Measurements: 3cm length x 4.2cm width x 0.1cm  depth; 0.99cm^3 volume. Character of Wound/Ulcer Post Debridement is improved. Severity of Tissue Post Debridement is: Fat layer exposed. Post procedure Diagnosis Wound #15: Same as Pre-Procedure Wound #16 Pre-procedure diagnosis of Wound #16 is a Venous Leg Ulcer located on the Left,Lateral Lower Leg .Severity of Tissue Pre Debridement is: Fat layer exposed. There was a Selective/Open Wound Non-Viable Tissue Debridement with a total area of 17.5 sq cm performed by Fredirick Maudlin, MD. With the following instrument(s): Curette to remove Non-Viable tissue/material. Material removed includes Eschar and Slough and after achieving pain control using Lidocaine 5% topical ointment. No specimens were taken. A time out was conducted at 16:14, prior to the start of the procedure. A Minimum amount of bleeding was controlled with Pressure. The procedure was tolerated well with a pain level of  0 throughout and a pain level of 0 following the procedure. Post Debridement Measurements: 5cm length x 3.5cm width x 0.1cm depth; 1.374cm^3 volume. Character of Wound/Ulcer Post Debridement is improved. Severity of Tissue Post Debridement is: Fat layer exposed. Post procedure Diagnosis Wound #16: Same as Pre-Procedure Wound #17 Pre-procedure diagnosis of Wound #17 is a Venous Leg Ulcer located on the Left,Medial Lower Leg .Severity of Tissue Pre Debridement is: Fat layer exposed. There was a Selective/Open Wound Non-Viable Tissue Debridement with a total area of 12 sq cm performed by Fredirick Maudlin, MD. With the following instrument(s): Curette to remove Non-Viable tissue/material. Material removed includes Eschar and Slough and after achieving pain control using Lidocaine 5% topical ointment. No specimens were taken. A time out was conducted at 16:14, prior to the start of the procedure. A Minimum amount of bleeding was controlled with Pressure. The procedure was tolerated well with a pain level of 0 throughout and a  pain level of 0 following the procedure. Post Debridement Measurements: 3cm length x 4cm width x 0.1cm depth; 0.942cm^3 volume. Character of Wound/Ulcer Post Debridement is improved. Severity of Tissue Post Debridement is: Fat layer exposed. Post procedure Diagnosis Wound #17: Same as Pre-Procedure Plan Follow-up Appointments: Return Appointment in 1 week. - Dr. Celine Ahr RM 1 Bathing/ Shower/ Hygiene: May shower with protection but do not get wound dressing(s) wet. Edema Control - Lymphedema / SCD / Other: Elevate legs to the level of the heart or above for 30 minutes daily and/or when sitting, a frequency of: - whenever sitting throughout the day Avoid standing for long periods of time. Exercise regularly The following medication(s) was prescribed: lidocaine topical 5 % ointment ointment topical for prior to debridement was prescribed at facility WOUND #14: - Lower Leg Wound Laterality: Right, Circumferential Peri-Wound Care: Sween Lotion (Moisturizing lotion) 1 x Per Week/ Discharge Instructions: Apply moisturizing lotion as directed Prim Dressing: KerraCel Ag Gelling Fiber Dressing, 4x5 in (silver alginate) 1 x Per Week/ ary Discharge Instructions: Apply silver alginate to wound bed as instructed Secondary Dressing: ABD Pad, 8x10 1 x Per Week/ Discharge Instructions: Apply over primary dressing as directed. Secondary Dressing: Zetuvit Plus 4x8 in 1 x Per Week/ Discharge Instructions: Apply over primary dressing as directed. Com pression Wrap: Kerlix Roll 4.5x3.1 (in/yd) 1 x Per Week/ Discharge Instructions: Apply Kerlix and Coban compression as directed. Com pression Wrap: Coban Self-Adherent Wrap 4x5 (in/yd) 1 x Per Week/ Discharge Instructions: Apply over Kerlix as directed. WOUND #15: - Lower Leg Wound Laterality: Left, Anterior Peri-Wound Care: Sween Lotion (Moisturizing lotion) 1 x Per Week/ Discharge Instructions: Apply moisturizing lotion as directed Prim Dressing: KerraCel Ag  Gelling Fiber Dressing, 4x5 in (silver alginate) 1 x Per Week/ ary Discharge Instructions: Apply silver alginate to wound bed as instructed Secondary Dressing: ABD Pad, 8x10 1 x Per Week/ Discharge Instructions: Apply over primary dressing as directed. Secondary Dressing: Zetuvit Plus 4x8 in 1 x Per Week/ Discharge Instructions: Apply over primary dressing as directed. Com pression Wrap: Kerlix Roll 4.5x3.1 (in/yd) 1 x Per Week/ Discharge Instructions: Apply Kerlix and Coban compression as directed. Com pression Wrap: Coban Self-Adherent Wrap 4x5 (in/yd) 1 x Per Week/ Discharge Instructions: Apply over Kerlix as directed. WOUND #16: - Lower Leg Wound Laterality: Left, Lateral Peri-Wound Care: Sween Lotion (Moisturizing lotion) 1 x Per Week/ Discharge Instructions: Apply moisturizing lotion as directed Prim Dressing: KerraCel Ag Gelling Fiber Dressing, 4x5 in (silver alginate) 1 x Per Week/ ary Discharge Instructions: Apply silver alginate to wound bed  as instructed Secondary Dressing: ABD Pad, 8x10 1 x Per Week/ Discharge Instructions: Apply over primary dressing as directed. Secondary Dressing: Zetuvit Plus 4x8 in 1 x Per Week/ Discharge Instructions: Apply over primary dressing as directed. Com pression Wrap: Kerlix Roll 4.5x3.1 (in/yd) 1 x Per Week/ Discharge Instructions: Apply Kerlix and Coban compression as directed. Com pression Wrap: Coban Self-Adherent Wrap 4x5 (in/yd) 1 x Per Week/ Discharge Instructions: Apply over Kerlix as directed. WOUND #17: - Lower Leg Wound Laterality: Left, Medial Peri-Wound Care: Sween Lotion (Moisturizing lotion) 1 x Per Week/ Discharge Instructions: Apply moisturizing lotion as directed Prim Dressing: KerraCel Ag Gelling Fiber Dressing, 4x5 in (silver alginate) 1 x Per Week/ ary Discharge Instructions: Apply silver alginate to wound bed as instructed Secondary Dressing: ABD Pad, 8x10 1 x Per Week/ Discharge Instructions: Apply over primary  dressing as directed. Secondary Dressing: Zetuvit Plus 4x8 in 1 x Per Week/ Discharge Instructions: Apply over primary dressing as directed. Com pression Wrap: Kerlix Roll 4.5x3.1 (in/yd) 1 x Per Week/ Discharge Instructions: Apply Kerlix and Coban compression as directed. Com pression Wrap: Coban Self-Adherent Wrap 4x5 (in/yd) 1 x Per Week/ Discharge Instructions: Apply over Kerlix as directed. 06/01/2022: There has been dramatic improvement in his wounds over the week. There has been substantial epithelialization. He still has circumferential wounds, but the open areas are smaller. There is a little slough and eschar accumulation on all the surfaces. Edema control is good. I used a curette to debride slough and eschar from all of the open portions of his wounds. We will continue to use silver alginate with Kerlix and Coban compression. He may benefit from an alternate form of long-term compression, such as juxta lite stockings or [place] wraps. We will look into these options for him. Follow-up in 1 week. Electronic Signature(s) Signed: 06/01/2022 4:55:03 PM By: Fredirick Maudlin MD FACS Entered By: Fredirick Maudlin on 06/01/2022 16:55:03 -------------------------------------------------------------------------------- HxROS Details Patient Name: Date of Service: Vincent [name], Vincent V ID M. 06/01/2022 3:30 PM Medical Record Number: [id number] Patient Account Number: [id number] Date of Birth/Sex: Treating RN: [id number] (86 y.o. M) Primary Care Provider: [name] Other Clinician: Referring Provider: Treating Provider/Extender: Celine Ahr in Treatment: 6 Information Obtained From Patient Chart Eyes Medical History: Positive for: Cataracts - left eye; Glaucoma Negative for: Optic Neuritis Respiratory Medical History: Positive for: Chronic Obstructive Pulmonary Disease (COPD) Past Medical History Notes: pulmonary eosinophilia Cardiovascular Medical  History: Positive for: Arrhythmia - afib; Congestive Heart Failure; Coronary Artery Disease; Hypertension; Myocardial Infarction Past Medical History Notes: hyperlipidemia, alcoholic cardiomyopathy Gastrointestinal Medical History: Past Medical History Notes: GERD Endocrine Medical History: Positive for: Type II Diabetes Negative for: Type I Diabetes Time with diabetes: 2 yrs Treated with: Oral agents Blood sugar tested every day: Yes Tested : Genitourinary Medical History: Negative for: End Stage Renal Disease Past Medical History Notes: incontinence, enlarged prostate Integumentary (Skin) Medical History: Negative for: History of Burn Musculoskeletal Medical History: Positive for: Gout Neurologic Medical History: Positive for: Neuropathy Past Medical History Notes: left foot drop, stroke Oncologic Medical History: Negative for: Received Chemotherapy; Received Radiation Psychiatric Medical History: Negative for: Anorexia/bulimia; Confinement Anxiety HBO Extended History Items Eyes: Eyes: Cataracts Glaucoma Immunizations Pneumococcal Vaccine: Received Pneumococcal Vaccination: Yes Received Pneumococcal Vaccination On or After 60th Birthday: Yes Implantable Devices No devices added Hospitalization / Surgery History Type of Hospitalization/Surgery cardiac cath with stents colonoscopy knee surgery Family and Social History Cancer: Yes - Mother,Siblings; Diabetes: No; Heart Disease: Yes - Father; Hereditary Spherocytosis: No; Hypertension:  Yes - Mother,Father; Kidney Disease: No; Lung Disease: No; Seizures: No; Stroke: No; Thyroid Problems: Yes - Siblings; Tuberculosis: No; Former smoker; Marital Status - Divorced; Alcohol Use: Never; Drug Use: No History; Caffeine Use: Rarely - coffee; Financial Concerns: No; Food, Clothing or Shelter Needs: No; Support System Lacking: No; Transportation Concerns: No Electronic Signature(s) Signed: 06/01/2022 4:56:11 PM By:  Fredirick Maudlin MD FACS Entered By: Fredirick Maudlin on 06/01/2022 16:53:04 -------------------------------------------------------------------------------- SuperBill Details Patient Name: Date of Service: Vincent [name], Vincent V ID M. 06/01/2022 Medical Record Number: [id number] Patient Account Number: [id number] Date of Birth/Sex: Treating RN: 1936/01/25 (87 y.o. M) Primary Care Provider: [name] Other Clinician: Referring Provider: Treating Provider/Extender: Celine Ahr in Treatment: 6 Diagnosis Coding ICD-10 Codes Code Description [phone] Non-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 leg limited to breakdown of skin L97.811 Non-pressure chronic ulcer of other part of right lower leg limited to breakdown of skin I87.2 Venous insufficiency (chronic) (peripheral) I50.9 Heart failure, unspecified E11.622 Type 2 diabetes mellitus with other skin ulcer Facility Procedures CPT4 Code: [id number] Description: [phone] - DEBRIDE WOUND 1ST 20 SQ CM OR < ICD-10 Diagnosis Description 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 L97.811 Non-pressure chronic ulcer of other part of right lower leg limited to  breakdown Modifier: f skin of skin Quantity: 1 CPT4 Code: [id number] Description: 51700 - DEBRIDE WOUND EA ADDL 20 SQ CM ICD-10 Diagnosis Description 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 L97.811 Non-pressure chronic ulcer of other part of right lower leg limited to  breakdown Modifier: f skin of skin Quantity: 17 Physician Procedures : CPT4 Code Description Modifier [id number] - WC PHYS LEVEL 3 - EST PT 25 ICD-10 Diagnosis Description 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f skin L97.811 Non-pressure chronic ulcer of other part of  right lower leg limited to breakdown of skin I87.2 Venous insufficiency (chronic)  (peripheral) E11.622 Type 2 diabetes mellitus with other skin ulcer Quantity: 1 : [id number] - WC PHYS DEBR WO ANESTH 20 SQ CM ICD-10 Diagnosis Description 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f skin L97.811 Non-pressure chronic ulcer of other part of right lower leg limited to  breakdown of skin Quantity: 1 : [id number] - WC PHYS DEBR WO ANESTH EA ADD 20 CM ICD-10 Diagnosis Description 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f skin L97.811 Non-pressure chronic ulcer of other part of right lower leg limited  to breakdown of skin Quantity: 17 Electronic Signature(s) Signed: 06/01/2022 4:55:28 PM By: Fredirick Maudlin MD FACS Entered By: Fredirick Maudlin on 06/01/2022 16:55:28

## 2022-06-10 ENCOUNTER — Emergency Department (HOSPITAL_COMMUNITY): Payer: Medicare Other

## 2022-06-10 ENCOUNTER — Inpatient Hospital Stay (HOSPITAL_COMMUNITY)
Admission: EM | Admit: 2022-06-10 | Discharge: 2022-06-15 | DRG: 291 | Disposition: A | Discharge status: Home Health | Payer: Medicare Other | Attending: Internal Medicine | Admitting: Internal Medicine

## 2022-06-10 ENCOUNTER — Encounter (HOSPITAL_COMMUNITY): Payer: Self-pay

## 2022-06-10 ENCOUNTER — Encounter (HOSPITAL_BASED_OUTPATIENT_CLINIC_OR_DEPARTMENT_OTHER): Payer: Medicare Other | Admitting: Internal Medicine

## 2022-06-10 DIAGNOSIS — J9601 Acute respiratory failure with hypoxia: Secondary | ICD-10-CM | POA: Diagnosis present

## 2022-06-10 DIAGNOSIS — Z7901 Long term (current) use of anticoagulants: Secondary | ICD-10-CM

## 2022-06-10 DIAGNOSIS — H409 Unspecified glaucoma: Secondary | ICD-10-CM | POA: Diagnosis present

## 2022-06-10 DIAGNOSIS — K219 Gastro-esophageal reflux disease without esophagitis: Secondary | ICD-10-CM | POA: Diagnosis present

## 2022-06-10 DIAGNOSIS — I4819 Other persistent atrial fibrillation: Secondary | ICD-10-CM | POA: Diagnosis not present

## 2022-06-10 DIAGNOSIS — Y92009 Unspecified place in unspecified non-institutional (private) residence as the place of occurrence of the external cause: Secondary | ICD-10-CM | POA: Diagnosis not present

## 2022-06-10 DIAGNOSIS — J449 Chronic obstructive pulmonary disease, unspecified: Secondary | ICD-10-CM | POA: Diagnosis not present

## 2022-06-10 DIAGNOSIS — R42 Dizziness and giddiness: Secondary | ICD-10-CM | POA: Diagnosis not present

## 2022-06-10 DIAGNOSIS — I472 Ventricular tachycardia, unspecified: Secondary | ICD-10-CM | POA: Diagnosis present

## 2022-06-10 DIAGNOSIS — I110 Hypertensive heart disease with heart failure: Secondary | ICD-10-CM | POA: Diagnosis not present

## 2022-06-10 DIAGNOSIS — G5732 Lesion of lateral popliteal nerve, left lower limb: Secondary | ICD-10-CM | POA: Diagnosis not present

## 2022-06-10 DIAGNOSIS — L97929 Non-pressure chronic ulcer of unspecified part of left lower leg with unspecified severity: Secondary | ICD-10-CM | POA: Diagnosis present

## 2022-06-10 DIAGNOSIS — Z79899 Other long term (current) drug therapy: Secondary | ICD-10-CM

## 2022-06-10 DIAGNOSIS — Z955 Presence of coronary angioplasty implant and graft: Secondary | ICD-10-CM | POA: Diagnosis not present

## 2022-06-10 DIAGNOSIS — E785 Hyperlipidemia, unspecified: Secondary | ICD-10-CM | POA: Diagnosis not present

## 2022-06-10 DIAGNOSIS — I509 Heart failure, unspecified: Secondary | ICD-10-CM | POA: Diagnosis not present

## 2022-06-10 DIAGNOSIS — I69354 Hemiplegia and hemiparesis following cerebral infarction affecting left non-dominant side: Secondary | ICD-10-CM

## 2022-06-10 DIAGNOSIS — I5023 Acute on chronic systolic (congestive) heart failure: Secondary | ICD-10-CM

## 2022-06-10 DIAGNOSIS — I10 Essential (primary) hypertension: Secondary | ICD-10-CM | POA: Diagnosis present

## 2022-06-10 DIAGNOSIS — W01198A Fall on same level from slipping, tripping and stumbling with subsequent striking against other object, initial encounter: Secondary | ICD-10-CM | POA: Diagnosis present

## 2022-06-10 DIAGNOSIS — E1169 Type 2 diabetes mellitus with other specified complication: Secondary | ICD-10-CM

## 2022-06-10 DIAGNOSIS — R0902 Hypoxemia: Secondary | ICD-10-CM | POA: Diagnosis not present

## 2022-06-10 DIAGNOSIS — E1141 Type 2 diabetes mellitus with diabetic mononeuropathy: Secondary | ICD-10-CM | POA: Diagnosis present

## 2022-06-10 DIAGNOSIS — R9439 Abnormal result of other cardiovascular function study: Secondary | ICD-10-CM

## 2022-06-10 DIAGNOSIS — Z7984 Long term (current) use of oral hypoglycemic drugs: Secondary | ICD-10-CM

## 2022-06-10 DIAGNOSIS — I5043 Acute on chronic combined systolic (congestive) and diastolic (congestive) heart failure: Secondary | ICD-10-CM | POA: Diagnosis present

## 2022-06-10 DIAGNOSIS — R079 Chest pain, unspecified: Secondary | ICD-10-CM | POA: Diagnosis present

## 2022-06-10 DIAGNOSIS — E782 Mixed hyperlipidemia: Secondary | ICD-10-CM | POA: Diagnosis not present

## 2022-06-10 DIAGNOSIS — E119 Type 2 diabetes mellitus without complications: Secondary | ICD-10-CM | POA: Diagnosis not present

## 2022-06-10 DIAGNOSIS — R231 Pallor: Secondary | ICD-10-CM | POA: Diagnosis not present

## 2022-06-10 DIAGNOSIS — W19XXXA Unspecified fall, initial encounter: Secondary | ICD-10-CM | POA: Diagnosis not present

## 2022-06-10 DIAGNOSIS — Z01812 Encounter for preprocedural laboratory examination: Secondary | ICD-10-CM

## 2022-06-10 DIAGNOSIS — Z87891 Personal history of nicotine dependence: Secondary | ICD-10-CM

## 2022-06-10 DIAGNOSIS — R7989 Other specified abnormal findings of blood chemistry: Secondary | ICD-10-CM | POA: Diagnosis not present

## 2022-06-10 DIAGNOSIS — I4821 Permanent atrial fibrillation: Secondary | ICD-10-CM | POA: Diagnosis present

## 2022-06-10 DIAGNOSIS — L97919 Non-pressure chronic ulcer of unspecified part of right lower leg with unspecified severity: Secondary | ICD-10-CM | POA: Diagnosis present

## 2022-06-10 DIAGNOSIS — Z23 Encounter for immunization: Secondary | ICD-10-CM

## 2022-06-10 DIAGNOSIS — E7849 Other hyperlipidemia: Secondary | ICD-10-CM

## 2022-06-10 DIAGNOSIS — I2510 Atherosclerotic heart disease of native coronary artery without angina pectoris: Secondary | ICD-10-CM | POA: Diagnosis present

## 2022-06-10 DIAGNOSIS — Z8249 Family history of ischemic heart disease and other diseases of the circulatory system: Secondary | ICD-10-CM | POA: Diagnosis not present

## 2022-06-10 DIAGNOSIS — G8929 Other chronic pain: Secondary | ICD-10-CM | POA: Diagnosis present

## 2022-06-10 DIAGNOSIS — M109 Gout, unspecified: Secondary | ICD-10-CM | POA: Diagnosis present

## 2022-06-10 DIAGNOSIS — I428 Other cardiomyopathies: Secondary | ICD-10-CM | POA: Diagnosis not present

## 2022-06-10 DIAGNOSIS — S31109A Unspecified open wound of abdominal wall, unspecified quadrant without penetration into peritoneal cavity, initial encounter: Secondary | ICD-10-CM | POA: Diagnosis not present

## 2022-06-10 DIAGNOSIS — L089 Local infection of the skin and subcutaneous tissue, unspecified: Secondary | ICD-10-CM | POA: Diagnosis not present

## 2022-06-10 DIAGNOSIS — Z8673 Personal history of transient ischemic attack (TIA), and cerebral infarction without residual deficits: Secondary | ICD-10-CM | POA: Diagnosis not present

## 2022-06-10 LAB — PROTIME-INR
INR: 1.3 — ABNORMAL HIGH (ref 0.8–1.2)
Prothrombin Time: 16.2 seconds — ABNORMAL HIGH (ref 11.4–15.2)

## 2022-06-10 LAB — BASIC METABOLIC PANEL
Anion gap: 7 (ref 5–15)
BUN: 14 mg/dL (ref 8–23)
CO2: 27 mmol/L (ref 22–32)
Calcium: 9.2 mg/dL (ref 8.9–10.3)
Chloride: 108 mmol/L (ref 98–111)
Creatinine, Ser: 0.96 mg/dL (ref 0.61–1.24)
GFR, Estimated: >60 mL/min (ref >60)
Glucose, Bld: 121 mg/dL — ABNORMAL HIGH (ref 70–99)
Potassium: 4.1 mmol/L (ref 3.5–5.1)
Sodium: 142 mmol/L (ref 135–145)

## 2022-06-10 LAB — CBC WITH DIFFERENTIAL/PLATELET
Abs Immature Granulocytes: 0.02 10*3/uL (ref 0.00–0.07)
Basophils Absolute: 0 10*3/uL (ref 0.0–0.1)
Basophils Relative: 1 %
Eosinophils Absolute: 0 10*3/uL (ref 0.0–0.5)
Eosinophils Relative: 1 %
HCT: 39.2 % (ref 39.0–52.0)
Hemoglobin: 12.1 g/dL — ABNORMAL LOW (ref 13.0–17.0)
Immature Granulocytes: 1 %
Lymphocytes Relative: 10 %
Lymphs Abs: 0.4 10*3/uL — ABNORMAL LOW (ref 0.7–4.0)
MCH: 26.4 pg (ref 26.0–34.0)
MCHC: 30.9 g/dL (ref 30.0–36.0)
MCV: 85.4 fL (ref 80.0–100.0)
Monocytes Absolute: 0.3 10*3/uL (ref 0.1–1.0)
Monocytes Relative: 7 %
Neutro Abs: 3.2 10*3/uL (ref 1.7–7.7)
Neutrophils Relative %: 80 %
Platelets: 231 10*3/uL (ref 150–400)
RBC: 4.59 MIL/uL (ref 4.22–5.81)
RDW: 18.2 % — ABNORMAL HIGH (ref 11.5–15.5)
WBC: 3.9 10*3/uL — ABNORMAL LOW (ref 4.0–10.5)
nRBC: 0 % (ref 0.0–0.2)

## 2022-06-10 LAB — TROPONIN I (HIGH SENSITIVITY)
Troponin I (High Sensitivity): 20 ng/L — ABNORMAL HIGH (ref <18)
Troponin I (High Sensitivity): 20 ng/L — ABNORMAL HIGH (ref <18)

## 2022-06-10 NOTE — ED Provider Triage Note (Signed)
Emergency Medicine Provider Triage Evaluation Note  Vincent [name] , a 86 y.o. male  was evaluated in triage.  Pt complains of dizziness, started around noon today, describes as being off balance, has a slight headache, states that he did fall denies any his head or losing conscious, patient is on warfarin, denies any change in vision paresthesias or weakness the upper or lower extremities, states he has had a history of a TIA no stroke in the past..  Review of Systems  Positive: Vertigo, headache Negative: Change in vision paresthesias  Physical Exam  BP (!) 158/98 (BP Location: Left Arm)   Pulse 61   Temp (!) 97.5 F (36.4 C) (Oral)   Resp 16   SpO2 97%  Gen:   Awake, no distress   Resp:  Normal effort  MSK:   Moves extremities without difficulty  Other:  No facial asymmetry no difficulty with word finding following two-step commands no Weakness present.  Medical Decision Making  Medically screening exam initiated at 3:38 PM.  Appropriate orders placed.  Vincent [name] was informed that the remainder of the evaluation will be completed by another provider, this initial triage assessment does not replace that evaluation, and the importance of remaining in the ED until their evaluation is complete.  No evidence of acute stroke at this time, patient did not strike his head after fall will not activate code trauma this was verified by charge nurse.  Lab work has been ordered as well as imaging.   Vincent Fennel, PA-C 06/10/22 1542

## 2022-06-10 NOTE — ED Triage Notes (Signed)
Pt BIB [facility] from his workplace. Pt woke up with "vertigo" and sinus congestion this AM. EMS report pt was clammy and hypertensive. Pt did have a fall and denied hitting his head. Pt went to bed without symptoms last PM. Pt denies pain. Pt is anticoagulated on Ell[place]is.   160/100 HR 90 RR 22 SpO2 96 CBG 102

## 2022-06-10 NOTE — ED Notes (Signed)
EDP Ileene Patrick notified pt having new chest pain; verbal order given for repeat EKG; pt taken back to triage area for repeat troponin and ekg

## 2022-06-11 ENCOUNTER — Emergency Department (HOSPITAL_COMMUNITY): Payer: Medicare Other

## 2022-06-11 DIAGNOSIS — R079 Chest pain, unspecified: Secondary | ICD-10-CM | POA: Diagnosis present

## 2022-06-11 DIAGNOSIS — E119 Type 2 diabetes mellitus without complications: Secondary | ICD-10-CM

## 2022-06-11 DIAGNOSIS — R7989 Other specified abnormal findings of blood chemistry: Secondary | ICD-10-CM

## 2022-06-11 DIAGNOSIS — Y92009 Unspecified place in unspecified non-institutional (private) residence as the place of occurrence of the external cause: Secondary | ICD-10-CM | POA: Diagnosis not present

## 2022-06-11 DIAGNOSIS — I2510 Atherosclerotic heart disease of native coronary artery without angina pectoris: Secondary | ICD-10-CM | POA: Diagnosis present

## 2022-06-11 DIAGNOSIS — Z23 Encounter for immunization: Secondary | ICD-10-CM | POA: Diagnosis present

## 2022-06-11 DIAGNOSIS — M109 Gout, unspecified: Secondary | ICD-10-CM | POA: Diagnosis present

## 2022-06-11 DIAGNOSIS — Z79899 Other long term (current) drug therapy: Secondary | ICD-10-CM | POA: Diagnosis not present

## 2022-06-11 DIAGNOSIS — G8929 Other chronic pain: Secondary | ICD-10-CM | POA: Diagnosis present

## 2022-06-11 DIAGNOSIS — I5043 Acute on chronic combined systolic (congestive) and diastolic (congestive) heart failure: Secondary | ICD-10-CM | POA: Diagnosis present

## 2022-06-11 DIAGNOSIS — I428 Other cardiomyopathies: Secondary | ICD-10-CM | POA: Diagnosis present

## 2022-06-11 DIAGNOSIS — E1141 Type 2 diabetes mellitus with diabetic mononeuropathy: Secondary | ICD-10-CM | POA: Diagnosis present

## 2022-06-11 DIAGNOSIS — J449 Chronic obstructive pulmonary disease, unspecified: Secondary | ICD-10-CM | POA: Diagnosis present

## 2022-06-11 DIAGNOSIS — L97919 Non-pressure chronic ulcer of unspecified part of right lower leg with unspecified severity: Secondary | ICD-10-CM | POA: Diagnosis present

## 2022-06-11 DIAGNOSIS — E1169 Type 2 diabetes mellitus with other specified complication: Secondary | ICD-10-CM

## 2022-06-11 DIAGNOSIS — Z87891 Personal history of nicotine dependence: Secondary | ICD-10-CM | POA: Diagnosis not present

## 2022-06-11 DIAGNOSIS — K219 Gastro-esophageal reflux disease without esophagitis: Secondary | ICD-10-CM | POA: Diagnosis present

## 2022-06-11 DIAGNOSIS — I10 Essential (primary) hypertension: Secondary | ICD-10-CM

## 2022-06-11 DIAGNOSIS — Z955 Presence of coronary angioplasty implant and graft: Secondary | ICD-10-CM | POA: Diagnosis not present

## 2022-06-11 DIAGNOSIS — J9601 Acute respiratory failure with hypoxia: Secondary | ICD-10-CM | POA: Diagnosis present

## 2022-06-11 DIAGNOSIS — I69354 Hemiplegia and hemiparesis following cerebral infarction affecting left non-dominant side: Secondary | ICD-10-CM | POA: Diagnosis not present

## 2022-06-11 DIAGNOSIS — Z8249 Family history of ischemic heart disease and other diseases of the circulatory system: Secondary | ICD-10-CM | POA: Diagnosis not present

## 2022-06-11 DIAGNOSIS — H409 Unspecified glaucoma: Secondary | ICD-10-CM | POA: Diagnosis present

## 2022-06-11 DIAGNOSIS — E785 Hyperlipidemia, unspecified: Secondary | ICD-10-CM | POA: Diagnosis present

## 2022-06-11 DIAGNOSIS — I4819 Other persistent atrial fibrillation: Secondary | ICD-10-CM

## 2022-06-11 DIAGNOSIS — I110 Hypertensive heart disease with heart failure: Secondary | ICD-10-CM | POA: Diagnosis present

## 2022-06-11 DIAGNOSIS — I472 Ventricular tachycardia, unspecified: Secondary | ICD-10-CM | POA: Diagnosis present

## 2022-06-11 DIAGNOSIS — G5732 Lesion of lateral popliteal nerve, left lower limb: Secondary | ICD-10-CM | POA: Diagnosis present

## 2022-06-11 DIAGNOSIS — R42 Dizziness and giddiness: Secondary | ICD-10-CM | POA: Diagnosis present

## 2022-06-11 DIAGNOSIS — E782 Mixed hyperlipidemia: Secondary | ICD-10-CM

## 2022-06-11 DIAGNOSIS — L97929 Non-pressure chronic ulcer of unspecified part of left lower leg with unspecified severity: Secondary | ICD-10-CM | POA: Diagnosis present

## 2022-06-11 DIAGNOSIS — I4821 Permanent atrial fibrillation: Secondary | ICD-10-CM | POA: Diagnosis present

## 2022-06-11 DIAGNOSIS — W01198A Fall on same level from slipping, tripping and stumbling with subsequent striking against other object, initial encounter: Secondary | ICD-10-CM | POA: Diagnosis present

## 2022-06-11 DIAGNOSIS — S31109A Unspecified open wound of abdominal wall, unspecified quadrant without penetration into peritoneal cavity, initial encounter: Secondary | ICD-10-CM | POA: Diagnosis not present

## 2022-06-11 DIAGNOSIS — W19XXXA Unspecified fall, initial encounter: Secondary | ICD-10-CM | POA: Diagnosis present

## 2022-06-11 DIAGNOSIS — L089 Local infection of the skin and subcutaneous tissue, unspecified: Secondary | ICD-10-CM

## 2022-06-11 LAB — BASIC METABOLIC PANEL
Anion gap: 7 (ref 5–15)
BUN: 14 mg/dL (ref 8–23)
CO2: 28 mmol/L (ref 22–32)
Calcium: 9 mg/dL (ref 8.9–10.3)
Chloride: 106 mmol/L (ref 98–111)
Creatinine, Ser: 0.98 mg/dL (ref 0.61–1.24)
GFR, Estimated: >60 mL/min (ref >60)
Glucose, Bld: 104 mg/dL — ABNORMAL HIGH (ref 70–99)
Potassium: 3.8 mmol/L (ref 3.5–5.1)
Sodium: 141 mmol/L (ref 135–145)

## 2022-06-11 LAB — DIGOXIN LEVEL: Digoxin Level: <0.2 ng/mL — ABNORMAL LOW (ref 0.8–2.0)

## 2022-06-11 LAB — BRAIN NATRIURETIC PEPTIDE: B Natriuretic Peptide: 2812.9 pg/mL — ABNORMAL HIGH (ref 0.0–100.0)

## 2022-06-11 MED ORDER — ATORVASTATIN CALCIUM 40 MG PO TABS
80.0000 mg | ORAL_TABLET | Freq: Every day | ORAL | Status: DC
  Administered 2022-06-11 – 2022-06-15 (×5): 80 mg via ORAL
  Filled 2022-06-11 (×5): qty 2

## 2022-06-11 MED ORDER — DIGOXIN 125 MCG PO TABS
0.1250 mg | ORAL_TABLET | Freq: Every day | ORAL | Status: DC
  Administered 2022-06-11 – 2022-06-15 (×5): 0.125 mg via ORAL
  Filled 2022-06-11 (×5): qty 1

## 2022-06-11 MED ORDER — NITROGLYCERIN 0.4 MG SL SUBL
0.4000 mg | SUBLINGUAL_TABLET | SUBLINGUAL | Status: DC | PRN
  Administered 2022-06-11: 0.4 mg via SUBLINGUAL
  Filled 2022-06-11: qty 1

## 2022-06-11 MED ORDER — FUROSEMIDE 10 MG/ML IJ SOLN
40.0000 mg | Freq: Two times a day (BID) | INTRAMUSCULAR | Status: DC
  Administered 2022-06-11 – 2022-06-13 (×4): 40 mg via INTRAVENOUS
  Filled 2022-06-11 (×4): qty 4

## 2022-06-11 MED ORDER — ALBUTEROL SULFATE (2.5 MG/3ML) 0.083% IN NEBU: 2.5000 mg | INHALATION_SOLUTION | Freq: Four times a day (QID) | RESPIRATORY_TRACT | Status: DC | PRN

## 2022-06-11 MED ORDER — BRIMONIDINE TARTRATE 0.15 % OP SOLN
1.0000 [drp] | Freq: Three times a day (TID) | OPHTHALMIC | Status: DC
  Administered 2022-06-11 – 2022-06-14 (×10): 1 [drp] via OPHTHALMIC
  Filled 2022-06-11: qty 5

## 2022-06-11 MED ORDER — KETOROLAC TROMETHAMINE 0.5 % OP SOLN
1.0000 [drp] | Freq: Every day | OPHTHALMIC | Status: DC
  Administered 2022-06-12 – 2022-06-14 (×3): 1 [drp] via OPHTHALMIC
  Filled 2022-06-11: qty 5

## 2022-06-11 MED ORDER — FUROSEMIDE 10 MG/ML IJ SOLN
40.0000 mg | Freq: Once | INTRAMUSCULAR | Status: AC
  Administered 2022-06-11: 40 mg via INTRAVENOUS
  Filled 2022-06-11: qty 4

## 2022-06-11 MED ORDER — ISOSORBIDE MONONITRATE ER 30 MG PO TB24
30.0000 mg | ORAL_TABLET | Freq: Every day | ORAL | Status: DC
  Administered 2022-06-11 – 2022-06-15 (×5): 30 mg via ORAL
  Filled 2022-06-11 (×5): qty 1

## 2022-06-11 MED ORDER — LATANOPROST 0.005 % OP SOLN
1.0000 [drp] | Freq: Every day | OPHTHALMIC | Status: DC
  Administered 2022-06-11 – 2022-06-14 (×4): 1 [drp] via OPHTHALMIC
  Filled 2022-06-11: qty 2.5

## 2022-06-11 MED ORDER — SODIUM CHLORIDE 0.9% FLUSH
3.0000 mL | Freq: Two times a day (BID) | INTRAVENOUS | Status: DC
  Administered 2022-06-11 – 2022-06-14 (×8): 3 mL via INTRAVENOUS

## 2022-06-11 MED ORDER — EMPAGLIFLOZIN 25 MG PO TABS
25.0000 mg | ORAL_TABLET | Freq: Every day | ORAL | Status: DC
  Administered 2022-06-12 – 2022-06-15 (×4): 25 mg via ORAL
  Filled 2022-06-11 (×5): qty 1

## 2022-06-11 MED ORDER — FAMOTIDINE 20 MG PO TABS
20.0000 mg | ORAL_TABLET | Freq: Two times a day (BID) | ORAL | Status: DC
  Administered 2022-06-11 – 2022-06-15 (×8): 20 mg via ORAL
  Filled 2022-06-11 (×8): qty 1

## 2022-06-11 MED ORDER — LORATADINE 10 MG PO TABS
10.0000 mg | ORAL_TABLET | Freq: Every day | ORAL | Status: DC
  Administered 2022-06-12 – 2022-06-15 (×4): 10 mg via ORAL
  Filled 2022-06-11 (×4): qty 1

## 2022-06-11 MED ORDER — ACETAMINOPHEN 325 MG PO TABS: 650.0000 mg | ORAL_TABLET | Freq: Four times a day (QID) | ORAL | Status: DC | PRN

## 2022-06-11 MED ORDER — ACETAMINOPHEN 650 MG RE SUPP: 650.0000 mg | Freq: Four times a day (QID) | RECTAL | Status: DC | PRN

## 2022-06-11 MED ORDER — SACUBITRIL-VALSARTAN 97-103 MG PO TABS
1.0000 | ORAL_TABLET | Freq: Two times a day (BID) | ORAL | Status: DC
  Administered 2022-06-11 – 2022-06-15 (×8): 1 via ORAL
  Filled 2022-06-11 (×8): qty 1

## 2022-06-11 MED ORDER — OFLOXACIN 0.3 % OP SOLN
1.0000 [drp] | Freq: Four times a day (QID) | OPHTHALMIC | Status: DC
  Administered 2022-06-11 – 2022-06-14 (×12): 1 [drp] via OPHTHALMIC
  Filled 2022-06-11: qty 5

## 2022-06-11 MED ORDER — CARVEDILOL 3.125 MG PO TABS
6.2500 mg | ORAL_TABLET | Freq: Two times a day (BID) | ORAL | Status: DC
  Administered 2022-06-11 – 2022-06-15 (×8): 6.25 mg via ORAL
  Filled 2022-06-11 (×8): qty 2

## 2022-06-11 MED ORDER — INFLUENZA VAC A&B SA ADJ QUAD 0.5 ML IM PRSY
0.5000 mL | PREFILLED_SYRINGE | INTRAMUSCULAR | Status: DC
  Filled 2022-06-11: qty 0.5

## 2022-06-11 MED ORDER — APIXABAN 5 MG PO TABS
5.0000 mg | ORAL_TABLET | Freq: Two times a day (BID) | ORAL | Status: DC
  Administered 2022-06-11 – 2022-06-15 (×8): 5 mg via ORAL
  Filled 2022-06-11 (×8): qty 1

## 2022-06-11 NOTE — H&P (Addendum)
History and Physical    Patient: Vincent [name] WHQ:[id number] DOB: [birth year] DOA: 06/10/2022 DOS: the patient was seen and examined on 06/11/2022 PCP: [name], MD  Patient coming from: Home via EMS  Chief Complaint:  Chief Complaint  Patient presents with   Dizziness   HPI: Vincent [name] is a 86 y.o. male with medical history significant of heart failure with reduced EF, permanent atrial fibrillation on anticoagulation, CAD, type 2 diabetes mellitus, CVA, left foot drop, and chronic wounds presented with complaints of dizziness. He works as a Chief Executive Officer in town and states he has a couple things to do prior to retiring.  Normally he is not on oxygen and and ambulates without use of assistance.  Yesterday , while he was try to get lunch he reported feeling dizzy that he describes as feeling funny with sinus congestion.  He lost his footing and reported falling into a small refrigerator with the microwave on top knocking it over and hitting a chair.  Denies any loss of consciousness or trauma to his head.  Patient was unable to get up despite trying for some time prior being able to call for assistance.  He is followed by wound care for chronic wounds of his lower extremities.  Denies any significant change in his weight, fever, cough, chest pain, nausea, vomiting, abdominal pain, or diarrhea symptoms.  He had a loop recorder in place, but notes that the battery ran out and he is to have it taken out.   Upon admission into the emergency department patient was noted to be afebrile with heart rate 61-1 49, respirations 16-27, blood pressures maintained, and O2 saturations noted to be as low as 80% with improvement on 2 L of nasal cannula oxygen.  Labs from 10/13 significant for WBC 3.9, hemoglobin 12.1, digoxin level <0.2, high-sensitivity troponin 20-> 20, and BNP 2812.9.  CT scan of the head did not note any acute abnormality with a large old infarct of right MCA.  Chest x-ray noted CHF  pattern.  Review of Systems: As mentioned in the history of present illness. All other systems reviewed and are negative. 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 s/p remote stent to Cx 2004 with chronic stable angina in context of residual circumflex disease.   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/25/2015   NSVT (nonsustained ventricular tachycardia) (HCC)    Permanent atrial fibrillation (HCC)    Pulmonary eosinophilia ([facility])    Sinus bradycardia    Stroke [facility])    Syncope and collapse    Unspecified glaucoma(365.9)    Past Surgical History:  Procedure Laterality Date   CARDIAC CATHETERIZATION N/A 07/30/2015   Procedure: Left Heart Cath and Coronary Angiography;  Surgeon: Belva Crome, MD;  Location: [facility];  Service: Cardiovascular;  Laterality: N/A;   COLONOSCOPY     KNEE SURGERY  loop recorder  09/2007   RIGHT/LEFT HEART CATH AND CORONARY ANGIOGRAPHY N/A 09/20/2021   Procedure: RIGHT/LEFT HEART CATH AND CORONARY ANGIOGRAPHY;  Surgeon: Martinique, Peter M, MD;  Location: [facility];  Service: Cardiovascular;  Laterality: N/A;   Social History:  reports that he quit smoking about 38 years ago. His smoking use included cigarettes. He has a 7.50 pack-year smoking history. He has never used smokeless tobacco. He reports that he does not drink alcohol and does not use drugs.  No Known Allergies  Family History  Problem Relation Age of  Onset   Colon cancer Mother    Hypertension Mother    Cancer Mother    Prostate cancer Brother    Cancer Brother    Heart disease Father    Heart attack Father    Hypertension Father    Heart attack Sister    Cancer Sister    Stroke Neg Hx    Colon polyps Neg Hx    Esophageal cancer Neg Hx    Stomach cancer Neg Hx    Rectal cancer Neg Hx    Pancreatic cancer Neg Hx     Prior to Admission medications   Medication Sig Start Date End Date Taking? Authorizing Provider  ACCU-CHEK FASTCLIX LANCETS MISC USE TO CHECK BLOOD SUGAR QD 06/05/18   [provider]  ACCU-CHEK GUIDE test strip USE TO CHECK BLOOD SUGAR QD 06/07/18   [provider]  acetaminophen (TYLENOL) 500 MG tablet Take 2 tablets (1,000 mg total) by mouth every 6 (six) hours as needed. 02/21/21   [name], MD  apixaban (ELIQUIS) 5 MG TABS tablet Take 1 tablet (5 mg total) by mouth 2 (two) times daily. 05/16/22   Larey Dresser, MD  atorvastatin (LIPITOR) 80 MG tablet Take 1 tablet (80 mg total) by mouth daily. 10/19/21   Larey Dresser, MD  Brimonidine Tartrate (ALPHAGAN P OP) Apply 1 drop to eye in the morning, at noon, and at bedtime. Both eyes    [provider]  Calcium Carbonate-Vitamin D (OSCAL 500 D-3 PO) Take 1 tablet by mouth 2 (two) times daily.    [provider]  carvedilol (COREG) 6.25 MG tablet Take 1 tablet (6.25 mg total) by mouth 2 (two) times daily with a meal. 12/07/21   Larey Dresser, MD  Continuous Blood Gluc Sensor (FREESTYLE LIBRE 14 DAY SENSOR) MISC USE AS DIRECTED EVERY 14 DAYS. 03/05/19   [provider]  Cyanocobalamin (VITAMIN B-12 CR PO) Take 1 tablet by mouth daily.    [provider]  digoxin (LANOXIN) 0.125 MG tablet Take 1 tablet (0.125 mg total) by mouth daily. 10/19/21   Larey Dresser, MD  famotidine (PEPCID) 20 MG tablet Take 20 mg by mouth 2 (two) times daily.    [provider]  Ferrous Sulfate (IRON PO) Take 1 tablet by  mouth daily.    [provider]  furosemide (LASIX) 20 MG tablet Take 1 tablet (20 mg total) by mouth 2 (two) times daily. 12/07/21   Larey Dresser, MD  isosorbide mononitrate (IMDUR) 30 MG 24 hr tablet Take 1 tablet (30 mg total) by mouth daily. 12/17/21   Larey Dresser, MD  JARDIANCE 25 MG TABS tablet Take 25 mg by mouth daily. 02/04/20   [provider]  loratadine (CLARITIN) 10 MG tablet Take 10 mg by mouth daily.    [provider]  Magnesium 250 MG TABS Take 1 tablet by mouth 2 (two)  times daily.    [provider]  meclizine (ANTIVERT) 25 MG tablet Take 25 mg by mouth 3 (three) times daily as needed for dizziness.    [provider]  metFORMIN (GLUCOPHAGE) 500 MG tablet Take 500 mg by mouth 2 (two) times daily. 06/16/19   [provider]  nitroGLYCERIN (NITROLINGUAL) 0.4 MG/SPRAY spray Place 1 spray under the tongue every 5 (five) minutes x 3 doses as needed for chest pain. Please call and schedule an appointment 04/20/21   Imogene Burn, PA-C  Omega-3 Fatty Acids (OMEGA-3 FISH OIL PO) Take 1 capsule by mouth daily.    [provider]  ROCKLATAN 0.02-0.005 % SOLN Apply 1 drop to eye at bedtime. 02/03/20   [provider]  sacubitril-valsartan (ENTRESTO) 97-103 MG Take 1 tablet by mouth 2 (two) times daily. 12/07/21   Larey Dresser, MD  spironolactone (ALDACTONE) 25 MG tablet Take 1 tablet (25 mg total) by mouth daily. 03/18/22   Larey Dresser, MD    Physical Exam: Vitals:   06/11/22 0738 06/11/22 0845 06/11/22 0900 06/11/22 0915  BP: 108/86 128/82 128/63 137/85  Pulse: 96     Resp: (!) 25 17 (!) 24 (!) 24  Temp: 98.1 F (36.7 C)     TempSrc: Oral     SpO2: 94%     Weight:      Height:        Constitutional: Elderly male currently in no acute distress Eyes: PERRL, lids and conjunctivae normal ENMT: Mucous membranes are moist. Posterior pharynx clear of any exudate or lesions.  Neck: normal,  supple\ Respiratory: Decreased overall aeration without significant wheezes, rhonchi, or crackles appreciated at this time.  Patient on 3 L nasal cannula oxygen with O2 saturation maintained. Cardiovascular: Irregular irregular.  At least 1+ pitting bilateral lower extremity edema with legs currently wrapped. Abdomen: no tenderness, no masses palpated. No hepatosplenomegaly. Bowel sounds positive.  Musculoskeletal: no clubbing / cyanosis. No joint deformity upper and lower extremities. Good ROM, no contractures. Normal muscle tone.  Skin: Lower extremity wounds currently bandaged. Neurologic: CN 2-12 grossly intact. Strength 5/5 in all 4.  Psychiatric: Normal judgment and insight. Alert and oriented x 3. Normal mood.   Data Reviewed:  EkG revealed atrial fibrillation at 87 bpm  Assessment and Plan:  Acute respiratory failure with hypoxia heart failure with reduced ejection fraction Acute on chronic.  Patient presents with complaints of congestion.  O2 saturations noted to be as low 80% on room air for which patient was placed on 2-3 L of nasal cannula oxygen with  improvement of O2 saturations greater than 92%.  BNP elevated 2912.9.  Chest x-ray showing CHF pattern.  Last echocardiogram noted EF to be 25-30% with global hypokinesis of the left ventricle 03/2022.  Previously evaluated for need of AICD, but documented that the patient declined this. -Admit to a cardiac telemetry bed -Heart failure order set utilized -Strict I&Os and daily weights -Lasix 40 mg twice daily -Continue Coreg and Entresto -Cardiology consulted, we will follow-up for any further recommendations  Elevated troponin Chronic.  High-sensitivity troponin flat at 20.  Thought secondary to demand in setting of CHF exacerbation. -Continue to monitor  Dizziness Fall  Patient reported having episode of dizziness yesterday where he felt off balance causing him to fall.  Denies any injury or loss of consciousness related to  the fall.  Patient normally ambulates without use of any assistance. -Check orthostatic vital signs -PT/OT to evaluate and treat -Follow-up  telemetry overnight  Persistent atrial fibrillation on chronic anticoagulation Patient appears to be currently rate controlled.  Patient appears he should have ran out of his digoxin prescription at the end of last month. -Continue Coreg  and Eliquis -Resume digoxin  Essential hypertension Home blood pressure regimen appears to include Coreg 6.25 mg twice daily, furosemide 20 mg twice daily, isosorbide mononitrate 30 mg daily.   -Continue Coreg, Entresto, and isosorbide mononitrate  CAD Prior history of PCI to RCA remotely.  Last heart cath 1/23 noted patent RCA stent with 70% stenosis distal left circumflex. -Continue isosorbide mononitrate and atorvastatin  Controlled diabetes mellitus type 2, without long-term use of insulin Last hemoglobin A1c 7 on 09/17/2021.  On admission glucose 121.  Home medication regimen includes metformin and Jardiance. -Held metformin -Continue Jardiance -Continue heart healthy carb modified diet  Hyperlipidemia -Continue atorvastatin  History of stroke Patient with prior history of right MCA with left foot drop. -PT to evaluate and treat.  Chronic lower extremity ulcers -Continue outpatient follow-up with wound clinic  Advance Care Planning:   Code Status: Full Code    Consults: Cardiology  Family Communication: Sister updated at bedside  Severity of Illness: The appropriate patient status for this patient is INPATIENT. Inpatient status is judged to be reasonable and necessary in order to provide the required intensity of service to ensure the patient's safety. The patient's presenting symptoms, physical exam findings, and initial radiographic and laboratory data in the context of their chronic comorbidities is felt to place them at high risk for further clinical deterioration. Furthermore, it is not  anticipated that the patient will be medically stable for discharge from the hospital within 2 midnights of admission.   * I certify that at the point of admission it is my clinical judgment that the patient will require inpatient hospital care spanning beyond 2 midnights from the point of admission due to high intensity of service, high risk for further deterioration and high frequency of surveillance required.*  Author: Norval Morton, MD 06/11/2022 9:40 AM  For on call review [email or web address].

## 2022-06-11 NOTE — Progress Notes (Signed)
Physical Therapy Evaluation Patient Details Name: Vincent [name] MRN: [id number] DOB: 09-26-1935 Today's Date: 06/11/2022  History of Present Illness  86 yo male with onset of vertigo and sinus congestion was admitted with a fall but not having hit his head.  Pt is admitted 10/13, had chest pain last PM.  Noted CHF with new O2 use of 2L per nasal cannula.  PMHx:  heart cath, loop recorder, NSVT, syncope, glaucoma, L side weakness from stroke, EtOH cardiomyopathy, bradycardia, COPD, L foot drop, HTN, GERD, PN, a-fib  Clinical Impression  Pt was seen and with residual effects of previous stroke, he is weaker and has inattention to L side along with issues of balance loss and difficulty avoiding obstacles on L side.  HHA was used but recommend him to go to rehab for strengthening to recover safe independent gait for his PLOF.  Pt has a family member in but she did not stay, resulting in possibly less PLOF history than her presence would have assisted.  Follow up to send to SNF due to depth of deficits, L side weakness, imbalance with gait, and low endurance.  Pt did maintain O2 sat on 2L O2 with 91% at end of session.  Recovered to 97% with rest very immediately.  Follow for goals of acute PT.       Recommendations for follow up therapy are one component of a multi-disciplinary discharge planning process, led by the attending physician.  Recommendations may be updated based on patient status, additional functional criteria and insurance authorization.  Follow Up Recommendations Skilled nursing-short term rehab (<3 hours/day) Can patient physically be transported by private vehicle: No    Assistance Recommended at Discharge Frequent or constant Supervision/Assistance  Patient can return home with the following  A lot of help with walking and/or transfers;A lot of help with bathing/dressing/bathroom;Assistance with cooking/housework;Direct supervision/assist for medications management;Direct  supervision/assist for financial management;Assist for transportation;Help with stairs or ramp for entrance    Equipment Recommendations None recommended by PT  Recommendations for Other Services       Functional Status Assessment Patient has had a recent decline in their functional status and demonstrates the ability to make significant improvements in function in a reasonable and predictable amount of time.     Precautions / Restrictions Precautions Precautions: Fall Precaution Comments: keep O2 sats at 92% or greater Restrictions Weight Bearing Restrictions: No      Mobility  Bed Mobility Overal bed mobility: Needs Assistance Bed Mobility: Supine to Sit, Sit to Supine     Supine to sit: Mod assist Sit to supine: Mod assist   General bed mobility comments: pt was completely wet and unaware    Transfers Overall transfer level: Needs assistance Equipment used: 1 person hand held assist Transfers: Sit to/from Stand Sit to Stand: Min assist           General transfer comment: pt is inattentive to L side and does not maintain grip with L hand on PT hand    Ambulation/Gait Ambulation/Gait assistance: Min assist Gait Distance (Feet): 60 Feet Assistive device: 1 person hand held assist Gait Pattern/deviations: Step-to pattern, Step-through pattern, Wide base of support, Decreased stride length Gait velocity: reduced Gait velocity interpretation: <1.8 ft/sec, indicate of risk for recurrent falls   General Gait Details: incoordination on LLE is greater than RLE, wide based steps with difficulty clearing L foot and does not attend to obstacles  Science writer  Modified [name] (Stroke Patients Only) Modified [name] (Stroke Patients Only) Pre-Morbid [name] Score: Slight disability Modified [name]: Moderately severe disability     Balance Overall balance assessment: Needs assistance Sitting-balance support: Feet supported Sitting  balance-Leahy Scale: Fair     Standing balance support: Single extremity supported Standing balance-Leahy Scale: Poor                               Pertinent Vitals/Pain Pain Assessment Pain Assessment: Faces Faces Pain Scale: No hurt    Home Living Family/patient expects to be discharged to:: Private residence Living Arrangements: Alone Available Help at Discharge: Family;Friend(s);Available PRN/intermittently Type of Home: House Home Access: Stairs to enter Entrance Stairs-Rails: Can reach both Entrance Stairs-Number of Steps: 2   Home Layout: One level   Additional Comments: pt is a poor historian    Prior Function Prior Level of Function : Independent/Modified Independent             Mobility Comments: was walking independently per pt       Hand Dominance   Dominant Hand: Right    Extremity/Trunk Assessment   Upper Extremity Assessment Upper Extremity Assessment: LUE deficits/detail LUE Deficits / Details: incoordination LUE LUE Coordination: decreased gross motor    Lower Extremity Assessment Lower Extremity Assessment: LLE deficits/detail LLE Deficits / Details: incoordination with weakness L DF LLE Coordination: decreased gross motor    Cervical / Trunk Assessment Cervical / Trunk Assessment: Kyphotic  Communication   Communication: No difficulties  Cognition Arousal/Alertness: Awake/alert Behavior During Therapy: Impulsive Overall Cognitive Status: No family/caregiver present to determine baseline cognitive functioning                                 General Comments: pt had a sister who stepped in and immediately left him        General Comments General comments (skin integrity, edema, etc.): requires HHA support when walking and does not demonstrate awareness of his L side during mobility    Exercises     Assessment/Plan    PT Assessment Patient needs continued PT services  PT Problem List Decreased  strength;Decreased balance;Decreased coordination;Decreased safety awareness;Cardiopulmonary status limiting activity       PT Treatment Interventions DME instruction;Gait training;Functional mobility training;Therapeutic activities;Therapeutic exercise;Balance training;Neuromuscular re-education;Patient/family education    PT Goals (Current goals can be found in the Care Plan section)  Acute Rehab PT Goals Patient Stated Goal: to get stronger PT Goal Formulation: With patient Time For Goal Achievement: 06/25/22 Potential to Achieve Goals: Good    Frequency Min 2X/week     Co-evaluation               AM-PAC PT "6 Clicks" Mobility  Outcome Measure Help needed turning from your back to your side while in a flat bed without using bedrails?: A Little Help needed moving from lying on your back to sitting on the side of a flat bed without using bedrails?: A Lot Help needed moving to and from a bed to a chair (including a wheelchair)?: A Lot Help needed standing up from a chair using your arms (e.g., wheelchair or bedside chair)?: A Little Help needed to walk in hospital room?: A Little Help needed climbing 3-5 steps with a railing? : Total 6 Click Score: 14    End of Session Equipment Utilized During Treatment: Oxygen;Gait belt Activity Tolerance: Patient limited by  fatigue;Treatment limited secondary to medical complications (Comment) Patient left: in bed;with call bell/phone within reach Nurse Communication: Mobility status PT Visit Diagnosis: Unsteadiness on feet (R26.81);Muscle weakness (generalized) (M62.81);Difficulty in walking, not elsewhere classified (R26.2);Hemiplegia and hemiparesis Hemiplegia - Right/Left: Left Hemiplegia - dominant/non-dominant: Non-dominant Hemiplegia - caused by: Unspecified    Time: 5913-6859 PT Time Calculation (min) (ACUTE ONLY): 27 min   Charges:   PT Evaluation $PT Eval Moderate Complexity: 1 Mod PT Treatments $Gait Training: 8-22  mins       Ramond Dial 06/11/2022, 3:45 PM  Mee Hives, PT PhD Acute Rehab Dept. Number: [place] [facility] [place]

## 2022-06-11 NOTE — Consult Note (Signed)
Cardiology Consultation   Patient ID: Vincent [name] MRN: [id number]; DOB: [birth year]  Admit date: 06/10/2022 Date of Consult: 06/11/2022  PCP:  [name], [place] [facility] Cardiologist: Aundra Dubin Electrophysiologist:  Virl Axe, MD  {    Patient Profile:   Vincent [name] is a 86 y.o. male with a hx of HFrEF, Afib, CAD, T2DM, CVA whoe presents for dizziness  who is being seen 06/11/2022 for the evaluation of dizziness and CHF  at the request of Dr [name].  History of Present Illness:   Vincent [name] is 86 y.o. with history of HFrEF, permanent AF, CAD, T2DM, CVA, left foot drop, and chronic wounds.   Admitted in Jan 2023 with increased shortness of breath in the setting of acute heart failure. On arrival EKG showed A fib/RVR with rate in the 100s. Diuresed with IV lasix. Echo completed with EF 25%. Had cath with borderline obstructive disease in the distal LCx, patent stent in the mid RCA. No change compared to 2016 cath. GDMT started.  The pt  has had chronic lower extremity wounds and has been followed at the wound clinic.  Repeat echo later in year showed LVEF 25 to 30%   RV mildly reduced.  MIld to moderate Vincent  The pt was at home yesterday when he became dizzy  Felt congested    Losst footing   [place]   No LOC  Unable to get up    Not clear how long he was down   On arrival sats were low (80)    Improved with 2L [place]    Troponin 20, 20.  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 s/p remote stent to Cx 2004 with chronic stable angina in context of residual circumflex  disease.   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/25/2015   NSVT (nonsustained ventricular tachycardia) (HCC)    Permanent atrial fibrillation (HCC)    Pulmonary eosinophilia (HCC)    Sinus bradycardia    Stroke [facility])    Syncope and collapse    Unspecified glaucoma(365.9)     Past Surgical History:  Procedure Laterality Date   CARDIAC CATHETERIZATION N/A 07/30/2015   Procedure: Left Heart Cath and Coronary Angiography;  Surgeon: Belva Crome, MD;  Location: [facility];  Service: Cardiovascular;  Laterality: N/A;   COLONOSCOPY     KNEE SURGERY     loop recorder  09/2007   RIGHT/LEFT HEART CATH AND CORONARY ANGIOGRAPHY N/A 09/20/2021   Procedure: RIGHT/LEFT HEART CATH AND CORONARY ANGIOGRAPHY;  Surgeon: Martinique, Peter M, MD;  Location: [facility];  Service: Cardiovascular;  Laterality: N/A;       Inpatient Medications: Scheduled Meds:  apixaban  5 mg Oral BID   atorvastatin  80 mg Oral Daily   brimonidine  1 drop Both Eyes TID   carvedilol  6.25 mg Oral BID WC   digoxin  0.125 mg Oral Daily   empagliflozin  25 mg Oral Daily   famotidine  20 mg Oral BID   furosemide  40 mg Intravenous BID   [START ON 06/12/2022] influenza vaccine adjuvanted  0.5 mL Intramuscular Tomorrow-1000   isosorbide mononitrate  30 mg Oral Daily   ketorolac  1 drop Left Eye Daily   latanoprost  1 drop Both Eyes QHS   [START ON 06/12/2022] loratadine  10 mg Oral Daily   ofloxacin  1 drop Left Eye QID   sacubitril-valsartan  1 tablet Oral BID   sodium chloride flush  3 mL Intravenous Q12H   Continuous Infusions:  Allergies:   No Known Allergies  Social History:   Social History   Socioeconomic  History   Marital status: Divorced    Spouse name: Not on file   Number of children: 0   Years of education: master's   Highest education level: Master's degree (e.g., MA, MS, MEng, MEd, MSW, MBA)  Occupational History   Occupation: semi-retired  Tobacco Use   Smoking status: Former    Packs/day: 0.50    Years: 15.00    Total pack years: 7.50    Types: Cigarettes    Quit date: 08/22/1983    Years since quitting: 38.8   Smokeless tobacco: Never  Vaping Use   Vaping Use: Never used  Substance and Sexual Activity   Alcohol use: No   Drug use: No   Sexual activity: Not on file  Other Topics Concern   Not on file  Social History Narrative   Patient drinks 1-2 cups of caffeine daily.   Patient is right handed.   Social Determinants of Health   Financial Resource Strain: Low Risk  (09/21/2021)   Overall Financial Resource Strain (CARDIA)    Difficulty of Paying Living Expenses: Not very hard  Food Insecurity: No Food Insecurity (06/11/2022)   Hunger Vital Sign    Worried About Running Out of Food in the Last Year: Never true    Ran Out of Food in the Last Year: Never true  Transportation Needs: No Transportation Needs (06/11/2022)   PRAPARE - Hydrologist (Medical): No    Lack of Transportation (Non-Medical): No  Physical Activity: Not on file  Stress: Not on file  Social Connections: Not on file  Intimate Partner Violence: Not At Risk (06/11/2022)   Humiliation, Afraid, Rape, and Kick questionnaire    Fear of Current or Ex-Partner: No    Emotionally Abused: No    Physically Abused: No    Sexually Abused: No    Family History:    Family History  Problem Relation Age of Onset   Colon cancer Mother    Hypertension Mother    Cancer Mother    Prostate cancer Brother    Cancer Brother    Heart disease Father    Heart attack Father    Hypertension Father    Heart attack Sister    Cancer Sister    Stroke Neg Hx    Colon polyps Neg Hx     Esophageal cancer Neg Hx    Stomach cancer Neg Hx    Rectal cancer Neg Hx  Pancreatic cancer Neg Hx      ROS:  Please see the history of present illness.   All other ROS reviewed and negative.     Physical Exam/Data:   Vitals:   06/11/22 1118 06/11/22 1459 06/11/22 1500 06/11/22 1539  BP:  132/73 132/73   Pulse:  84    Resp:  18    Temp: 98.4 F (36.9 C) 98.7 F (37.1 C)    TempSrc: Oral Oral    SpO2:  96%    Weight:    81.2 kg  Height:    '5\' 9"'$  (1.753 m)    Intake/Output Summary (Last 24 hours) at 06/11/2022 1609 Last data filed at 06/11/2022 1549 Gross per 24 hour  Intake --  Output 200 ml  Net -200 ml      06/11/2022    3:39 PM 06/10/2022    3:38 PM 03/29/2022    3:17 PM  Last 3 Weights  Weight (lbs) 179 lb 0.2 oz 176 lb 176 lb 12.8 oz  Weight (kg) 81.2 kg 79.833 kg 80.196 kg     Body mass index is 26.44 kg/m.  General:  Well nourished, well developed, in no acute distress  Laying flat HEENT: normal Neck: no JVD Vascular: No carotid bruits; Distal pulses 2+ bilaterally Cardiac:  Irreg irreg  NO S3    Lungs:  clear to auscultation bilaterally  Abd: soft, nontender, no hepatomegaly  Ext: no edema Musculoskeletal:  No deformities Skin: warm and dry  Neuro:  CNs 2-12 intact, no focal abnormalities noted Psych:  Normal affect   EKG:  The EKG was personally reviewed and demonstrates:  Yesterday  Afib 87 bpm   T wave inversion laterally  Telemetry:  Telemetry was personally reviewed and demonstrates:  Afib 90s      Laboratory Data:  High Sensitivity Troponin:   Recent Labs  Lab 06/10/22 1547 06/10/22 1857  TROPONINIHS 20* 20*     Chemistry Recent Labs  Lab 06/10/22 1547  NA 142  K 4.1  CL 108  CO2 27  GLUCOSE 121*  BUN 14  CREATININE 0.96  CALCIUM 9.2  GFRNONAA >60  ANIONGAP 7    No results for input(s): "PROT", "ALBUMIN", "AST", "ALT", "ALKPHOS", "BILITOT" in the last 168 hours. Lipids No results for input(s): "CHOL", "TRIG",  "HDL", "LABVLDL", "LDLCALC", "CHOLHDL" in the last 168 hours.  Hematology Recent Labs  Lab 06/10/22 1547  WBC 3.9*  RBC 4.59  HGB 12.1*  HCT 39.2  MCV 85.4  MCH 26.4  MCHC 30.9  RDW 18.2*  PLT 231   Thyroid No results for input(s): "TSH", "FREET4" in the last 168 hours.  BNP Recent Labs  Lab 06/11/22 0504  BNP 2,812.9*    DDimer No results for input(s): "DDIMER" in the last 168 hours.   Radiology/Studies:  DG Chest Portable 1 View  Result Date: 06/11/2022 CLINICAL DATA:  Hypoxia EXAM: PORTABLE CHEST 1 VIEW COMPARISON:  09/17/2021 FINDINGS: Chronic cardiomegaly. Prominence of hilar vessels with diffuse interstitial opacity and cephalized blood flow. No visible effusion or pneumothorax. Cardiac device over the left chest. IMPRESSION: CHF pattern. Electronically Signed   By: Jorje Guild M.D.   On: 06/11/2022 07:59   CT Head Wo Contrast  Result Date: 06/10/2022 CLINICAL DATA:  TIA, vertigo EXAM: CT HEAD WITHOUT CONTRAST TECHNIQUE: Contiguous axial images were obtained from the base of the skull through the vertex without intravenous contrast. RADIATION DOSE REDUCTION: This exam was performed according to the departmental dose-optimization program which includes  automated exposure control, adjustment of the mA and/or kV according to patient size and/or use of iterative reconstruction technique. COMPARISON:  03/05/2015 FINDINGS: Brain: No acute intracranial findings are seen. There are no signs of bleeding within the cranium. There is large old infarct in right frontal, temporal and parietal lobes in right MCA distribution. Cortical sulci are prominent. There is no focal edema or mass effect. Vascular: Unremarkable. Skull: Unremarkable. Sinuses/Orbits: Unremarkable. Other: None. IMPRESSION: No acute intracranial findings are seen. There is large old infarct in right MCA distribution. Atrophy. No significant interval changes are noted. Electronically Signed   By: Elmer Picker  M.D.   On: 06/10/2022 16:43     Assessment and Plan:   Dizziness  Pt complains of some congestion.   Exam unrmarkable other than afib       He was hypoxic on arrival  He did receive lasix before I saw him ? Hypoxia with CHF   ? Orthostasis   ? Arrhythma (afib vs other)  I would reocmm orthostatics   PT should evaluate patient  COntinue telemetry       2  HFrEF  NICM  (Has CAD , but LV dysfunction out of proportion)  VOlume status appears OK now   Follow      3  Troponin   Trivial, flat    may be due to hypoxia  4  Permanent afib    Rate control and Eliquis    Continue tele  Ambulate to follow rate   5  CAD  REmote PCI of RCA   Last cath in 08/2021 patent RCA stent; 70% distal LCx     7  HL  ON lipitor \  8  HTN   BP is ok     9  Chronic LE ulcers  Follows in wound center           For questions or updates, please contact [place] Please consult [email or web address] for contact info under    Signed, Dorris Carnes, MD  06/11/2022 4:09 PM

## 2022-06-11 NOTE — ED Provider Notes (Signed)
  Physical Exam  BP 137/85   Pulse 96   Temp 98.1 F (36.7 C) (Oral)   Resp (!) 24   Ht 1.753 m ('5\' 9"'$ )   Wt 79.8 kg   SpO2 94%   BMI 25.99 kg/m   Physical Exam WDWN male nad Increased respiratory rate on  at 2 l/m  Procedures  Procedures  ED Course / MDM   Clinical Course as of 06/11/22 0947  Sat Jun 11, 2022  0906 Chest x-Lucion Dilger reviewed and interpreted consistent with CHF [DR]    Clinical Course User Index [DR] Pattricia Boss, MD   Medical Decision Making Amount and/or Complexity of Data Reviewed Labs: ordered. Radiology: ordered.  Risk Prescription drug management.   86 yo male presented with dizziness and vertigo.  Patient states he fell secondary to this.  He struck his head and is on eliquis and digoxin. Dig and bnp pending. Patient reports daily chest pain Chest pain here and treated with nitro and chest pain resolved here. Sats 89% now, previously documented at 98% CXR Dig Bnp Pending Will need admission for further evaluation and treatment. BNP significatly elevate at >2000 Peripheral compression wraps in place Troponin stable at 20>20  86 yo male ho cad, paf, etoh cardiomyopathy, stroke, presents with dizziness, chest pain and dyspnea. Here evaluation significant for chf and new oxygen requirement. Patient's bp 108/86 Lasix iv ordered Patient stable currently on oxygen.  Discussed with Dr. Harvest Forest who will see for admission        Pattricia Boss, MD 06/11/22 [phone]

## 2022-06-11 NOTE — ED Provider Notes (Incomplete)
[facility] Provider Note   CSN: [id number] Arrival date & time: 06/10/22  1321     History {Add pertinent medical, surgical, social history, OB history to HPI:1} Chief Complaint  Patient presents with  . Dizziness    Vincent [name] is a 86 y.o. male.   Dizziness      Home Medications Prior to Admission medications   Medication Sig Start Date End Date Taking? Authorizing Provider  acetaminophen (TYLENOL) 500 MG tablet Take 2 tablets (1,000 mg total) by mouth every 6 (six) hours as needed. Patient taking differently: Take 1,000 mg by mouth every 6 (six) hours as needed for moderate pain or mild pain. 02/21/21  Yes [name], MD  apixaban (ELIQUIS) 5 MG TABS tablet Take 1 tablet (5 mg total) by mouth 2 (two) times daily. 05/16/22  Yes Larey Dresser, MD  atorvastatin (LIPITOR) 80 MG tablet Take 1 tablet (80 mg total) by mouth daily. 10/19/21  Yes Larey Dresser, MD  Brimonidine Tartrate (ALPHAGAN P OP) Place 1 drop into both eyes in the morning, at noon, and at bedtime.   Yes [provider]  Calcium Carbonate-Vitamin D (OSCAL 500 D-3 PO) Take 1 tablet by mouth 2 (two) times daily.   Yes [provider]  carvedilol (COREG) 6.25 MG tablet Take 1 tablet (6.25 mg total) by mouth 2 (two) times daily with a meal. 12/07/21  Yes Larey Dresser, MD  Cyanocobalamin (VITAMIN B-12 CR PO) Take 1 tablet by mouth daily.   Yes [provider]  digoxin (LANOXIN) 0.125 MG tablet Take 1 tablet (0.125 mg total) by mouth daily. 10/19/21  Yes Larey Dresser, MD  famotidine (PEPCID) 20 MG tablet Take 20 mg by mouth 2 (two) times daily.   Yes [provider]  Ferrous Sulfate (IRON PO) Take 1 tablet by mouth daily.   Yes [provider]  furosemide (LASIX) 20 MG tablet Take 1 tablet (20 mg total) by mouth 2 (two) times daily. 12/07/21  Yes Larey Dresser, MD  isosorbide mononitrate (IMDUR) 30 MG 24 hr tablet Take 1 tablet (30 mg  total) by mouth daily. 12/17/21  Yes Larey Dresser, MD  JARDIANCE 25 MG TABS tablet Take 25 mg by mouth daily. 02/04/20  Yes [provider]  loratadine (CLARITIN) 10 MG tablet Take 10 mg by mouth daily.   Yes [provider]  LUMIGAN 0.01 % SOLN Place 1 drop into both eyes at bedtime. 06/05/22  Yes [provider]  Magnesium 250 MG TABS Take 1 tablet by mouth 2 (two) times daily.   Yes [provider]  metFORMIN (GLUCOPHAGE) 500 MG tablet Take 500 mg by mouth 2 (two) times daily. 06/16/19  Yes [provider]  OCUFLOX 0.3 % ophthalmic solution Place 1 drop into the left eye 4 (four) times daily. 02/04/22  Yes [provider]  Omega-3 Fatty Acids (OMEGA-3 FISH OIL PO) Take 1 capsule by mouth daily.   Yes [provider]  PROLENSA 0.07 % SOLN Place 1 drop into the left eye daily. 02/04/22  Yes [provider]  sacubitril-valsartan (ENTRESTO) 97-103 MG Take 1 tablet by mouth 2 (two) times daily. 12/07/21  Yes Larey Dresser, MD  nitroGLYCERIN (NITROLINGUAL) 0.4 MG/SPRAY spray Place 1 spray under the tongue every 5 (five) minutes x 3 doses as needed for chest pain. Please call and schedule an appointment 04/20/21   Imogene Burn, PA-C  Allergies    Patient has no known allergies.    Review of Systems   Review of Systems  Neurological:  Positive for dizziness.    Physical Exam Updated Vital Signs BP 114/78   Pulse 88   Temp 98 F (36.7 C) (Oral)   Resp 18   Ht '5\' 9"'$  (1.753 m)   Wt 81.2 kg   SpO2 94%   BMI 26.44 kg/m  Physical Exam  ED Results / Procedures / Treatments   Labs (all labs ordered are listed, but only abnormal results are displayed) Labs Reviewed  BASIC METABOLIC PANEL - Abnormal; Notable for the following components:      Result Value   Glucose, Bld 121 (*)    All other components within normal limits  CBC WITH DIFFERENTIAL/PLATELET - Abnormal; Notable for the following components:   WBC 3.9  (*)    Hemoglobin 12.1 (*)    RDW 18.2 (*)    Lymphs Abs 0.4 (*)    All other components within normal limits  PROTIME-INR - Abnormal; Notable for the following components:   Prothrombin Time 16.2 (*)    INR 1.3 (*)    All other components within normal limits  BRAIN NATRIURETIC PEPTIDE - Abnormal; Notable for the following components:   B Natriuretic Peptide 2,812.9 (*)    All other components within normal limits  DIGOXIN LEVEL - Abnormal; Notable for the following components:   Digoxin Level <0.2 (*)    All other components within normal limits  BASIC METABOLIC PANEL - Abnormal; Notable for the following components:   Glucose, Bld 104 (*)    All other components within normal limits  TROPONIN I (HIGH SENSITIVITY) - Abnormal; Notable for the following components:   Troponin I (High Sensitivity) 20 (*)    All other components within normal limits  TROPONIN I (HIGH SENSITIVITY) - Abnormal; Notable for the following components:   Troponin I (High Sensitivity) 20 (*)    All other components within normal limits  BASIC METABOLIC PANEL    EKG EKG Interpretation  Date/Time:  Friday June 10 2022 18:58:49 EDT Ventricular Rate:  90 PR Interval:    QRS Duration: 114 QT Interval:  380 QTC Calculation: 464 R Axis:   70 Text Interpretation: Undetermined rhythm ST & T wave abnormality, consider lateral ischemia Prolonged QT Abnormal ECG When compared with ECG of 10-Jun-2022 13:23, PREVIOUS ECG IS PRESENT similar to earlier in night Confirmed by Merrily Pew [phone]) on 06/10/2022 11:03:08 PM  Radiology DG Chest Portable 1 View  Result Date: 06/11/2022 CLINICAL DATA:  Hypoxia EXAM: PORTABLE CHEST 1 VIEW COMPARISON:  09/17/2021 FINDINGS: Chronic cardiomegaly. Prominence of hilar vessels with diffuse interstitial opacity and cephalized blood flow. No visible effusion or pneumothorax. Cardiac device over the left chest. IMPRESSION: CHF pattern. Electronically Signed   By: Jorje Guild  M.D.   On: 06/11/2022 07:59   CT Head Wo Contrast  Result Date: 06/10/2022 CLINICAL DATA:  TIA, vertigo EXAM: CT HEAD WITHOUT CONTRAST TECHNIQUE: Contiguous axial images were obtained from the base of the skull through the vertex without intravenous contrast. RADIATION DOSE REDUCTION: This exam was performed according to the departmental dose-optimization program which includes automated exposure control, adjustment of the mA and/or kV according to patient size and/or use of iterative reconstruction technique. COMPARISON:  03/05/2015 FINDINGS: Brain: No acute intracranial findings are seen. There are no signs of bleeding within the cranium. There is large old infarct in right frontal, temporal and parietal lobes in right  MCA distribution. Cortical sulci are prominent. There is no focal edema or mass effect. Vascular: Unremarkable. Skull: Unremarkable. Sinuses/Orbits: Unremarkable. Other: None. IMPRESSION: No acute intracranial findings are seen. There is large old infarct in right MCA distribution. Atrophy. No significant interval changes are noted. Electronically Signed   By: Elmer Picker M.D.   On: 06/10/2022 16:43    Procedures Procedures  {Document cardiac monitor, telemetry assessment procedure when appropriate:1}  Medications Ordered in ED Medications  nitroGLYCERIN (NITROSTAT) SL tablet 0.4 mg (0.4 mg Sublingual Given 06/11/22 0530)  sodium chloride flush (NS) 0.9 % injection 3 mL (3 mLs Intravenous Given 06/11/22 2205)  acetaminophen (TYLENOL) tablet 650 mg (has no administration in time range)    Or  acetaminophen (TYLENOL) suppository 650 mg (has no administration in time range)  albuterol (PROVENTIL) (2.5 MG/3ML) 0.083% nebulizer solution 2.5 mg (has no administration in time range)  furosemide (LASIX) injection 40 mg (40 mg Intravenous Given 06/11/22 1747)  atorvastatin (LIPITOR) tablet 80 mg (80 mg Oral Given 06/11/22 1748)  carvedilol (COREG) tablet 6.25 mg (6.25 mg Oral  Given 06/11/22 1748)  isosorbide mononitrate (IMDUR) 24 hr tablet 30 mg (30 mg Oral Given 06/11/22 1748)  sacubitril-valsartan (ENTRESTO) 97-103 mg per tablet (1 tablet Oral Given 06/11/22 2202)  apixaban (ELIQUIS) tablet 5 mg (5 mg Oral Given 06/11/22 1748)  famotidine (PEPCID) tablet 20 mg (20 mg Oral Given 06/11/22 2202)  loratadine (CLARITIN) tablet 10 mg (has no administration in time range)  brimonidine (ALPHAGAN) 0.15 % ophthalmic solution 1 drop (1 drop Both Eyes Given 06/11/22 2203)  latanoprost (XALATAN) 0.005 % ophthalmic solution 1 drop (1 drop Both Eyes Given 06/11/22 2203)  ofloxacin (OCUFLOX) 0.3 % ophthalmic solution 1 drop (1 drop Left Eye Given 06/11/22 2204)  ketorolac (ACULAR) 0.5 % ophthalmic solution 1 drop (1 drop Left Eye Not Given 06/11/22 1848)  digoxin (LANOXIN) tablet 0.125 mg (0.125 mg Oral Given 06/11/22 1748)  empagliflozin (JARDIANCE) tablet 25 mg (25 mg Oral Not Given 06/11/22 1848)  influenza vaccine adjuvanted (FLUAD) injection 0.5 mL (has no administration in time range)  furosemide (LASIX) injection 40 mg (40 mg Intravenous Given 06/11/22 1107)    ED Course/ Medical Decision Making/ A&P Clinical Course as of 06/11/22 2344  Sat Jun 11, 2022  0906 Chest x-ray reviewed and interpreted consistent with CHF [DR]    Clinical Course User Index [DR] Pattricia Boss, MD                           Medical Decision Making Amount and/or Complexity of Data Reviewed Labs: ordered. Radiology: ordered.  Risk Prescription drug management. Decision regarding hospitalization.   ***  {Document critical care time when appropriate:1} {Document review of labs and clinical decision tools ie heart score, Chads2Vasc2 etc:1}  {Document your independent review of radiology images, and any outside records:1} {Document your discussion with family members, caretakers, and with consultants:1} {Document social determinants of health affecting pt's care:1} {Document your  decision making why or why not admission, treatments were needed:1} Final Clinical Impression(s) / ED Diagnoses Final diagnoses:  Acute on chronic congestive heart failure, unspecified heart failure type ([place])    Rx / DC Orders ED Discharge Orders     None

## 2022-06-11 NOTE — ED Provider Notes (Signed)
[facility] Provider Note   CSN: [id number] Arrival date & time: 06/10/22  1321     History  Chief Complaint  Patient presents with   Dizziness    Vincent [name] is a 86 y.o. male.  86 year old male who presents the ER today initially for dizziness.  Patient states that he has history of vertigo and had episode of vertigo tonight which caused him to fall and hit his head.  Had trouble get off the floor initially but ultimately was able to.  Still with vertigo at that time.  Came here for further evaluation.  Denies any other complaints at this time he states he feels normal.  No neurologic changes.  No numbness, paresthesias or weakness anywhere.   Dizziness      Home Medications Prior to Admission medications   Medication Sig Start Date End Date Taking? Authorizing Provider  acetaminophen (TYLENOL) 500 MG tablet Take 2 tablets (1,000 mg total) by mouth every 6 (six) hours as needed. Patient taking differently: Take 1,000 mg by mouth every 6 (six) hours as needed for moderate pain or mild pain. 02/21/21  Yes [name], MD  apixaban (ELIQUIS) 5 MG TABS tablet Take 1 tablet (5 mg total) by mouth 2 (two) times daily. 05/16/22  Yes Larey Dresser, MD  atorvastatin (LIPITOR) 80 MG tablet Take 1 tablet (80 mg total) by mouth daily. 10/19/21  Yes Larey Dresser, MD  Brimonidine Tartrate (ALPHAGAN P OP) Place 1 drop into both eyes in the morning, at noon, and at bedtime.   Yes [provider]  Calcium Carbonate-Vitamin D (OSCAL 500 D-3 PO) Take 1 tablet by mouth 2 (two) times daily.   Yes [provider]  carvedilol (COREG) 6.25 MG tablet Take 1 tablet (6.25 mg total) by mouth 2 (two) times daily with a meal. 12/07/21  Yes Larey Dresser, MD  Cyanocobalamin (VITAMIN B-12 CR PO) Take 1 tablet by mouth daily.   Yes [provider]  digoxin (LANOXIN) 0.125 MG tablet Take 1 tablet (0.125 mg total) by mouth daily. 10/19/21  Yes Larey Dresser, MD  famotidine (PEPCID) 20 MG tablet Take 20 mg by mouth 2 (two) times daily.   Yes [provider]  Ferrous Sulfate (IRON PO) Take 1 tablet by mouth daily.   Yes [provider]  furosemide (LASIX) 20 MG tablet Take 1 tablet (20 mg total) by mouth 2 (two) times daily. 12/07/21  Yes Larey Dresser, MD  isosorbide mononitrate (IMDUR) 30 MG 24 hr tablet Take 1 tablet (30 mg total) by mouth daily. 12/17/21  Yes Larey Dresser, MD  JARDIANCE 25 MG TABS tablet Take 25 mg by mouth daily. 02/04/20  Yes [provider]  loratadine (CLARITIN) 10 MG tablet Take 10 mg by mouth daily.   Yes [provider]  LUMIGAN 0.01 % SOLN Place 1 drop into both eyes at bedtime. 06/05/22  Yes [provider]  Magnesium 250 MG TABS Take 1 tablet by mouth 2 (two) times daily.   Yes [provider]  metFORMIN (GLUCOPHAGE) 500 MG tablet Take 500 mg by mouth 2 (two) times daily. 06/16/19  Yes [provider]  OCUFLOX 0.3 % ophthalmic solution Place 1 drop into the left eye 4 (four) times daily. 02/04/22  Yes [provider]  Omega-3 Fatty Acids (OMEGA-3 FISH OIL PO) Take 1 capsule by mouth daily.   Yes [provider]  PROLENSA 0.07 % SOLN Place  1 drop into the left eye daily. 02/04/22  Yes [provider]  sacubitril-valsartan (ENTRESTO) 97-103 MG Take 1 tablet by mouth 2 (two) times daily. 12/07/21  Yes Larey Dresser, MD  nitroGLYCERIN (NITROLINGUAL) 0.4 MG/SPRAY spray Place 1 spray under the tongue every 5 (five) minutes x 3 doses as needed for chest pain. Please call and schedule an appointment 04/20/21   Imogene Burn, PA-C      Allergies    Patient has no known allergies.    Review of Systems   Review of Systems  Neurological:  Positive for dizziness.    Physical Exam Updated Vital Signs BP 114/78   Pulse 88   Temp 98 F (36.7 C) (Oral)   Resp 18   Ht '5\' 9"'$  (1.753 m)   Wt 81.2 kg   SpO2 94%   BMI 26.44 kg/m   Physical Exam Vitals and nursing note reviewed.  Constitutional:      Appearance: He is well-developed.  HENT:     Head: Normocephalic and atraumatic.     Mouth/Throat:     Mouth: Mucous membranes are dry.  Eyes:     Pupils: Pupils are equal, round, and reactive to light.  Cardiovascular:     Rate and Rhythm: Normal rate.  Pulmonary:     Effort: Pulmonary effort is normal. No respiratory distress.  Abdominal:     General: Abdomen is flat. There is no distension.  Musculoskeletal:        General: Normal range of motion.     Cervical back: Normal range of motion.  Skin:    General: Skin is warm and dry.  Neurological:     General: No focal deficit present.     Mental Status: He is alert and oriented to person, place, and time.     Motor: No weakness.     Comments: Stands without difficulty.  Moves all extremities.  No obvious cranial nerve abnormalities.     ED Results / Procedures / Treatments   Labs (all labs ordered are listed, but only abnormal results are displayed) Labs Reviewed  BASIC METABOLIC PANEL - Abnormal; Notable for the following components:      Result Value   Glucose, Bld 121 (*)    All other components within normal limits  CBC WITH DIFFERENTIAL/PLATELET - Abnormal; Notable for the following components:   WBC 3.9 (*)    Hemoglobin 12.1 (*)    RDW 18.2 (*)    Lymphs Abs 0.4 (*)    All other components within normal limits  PROTIME-INR - Abnormal; Notable for the following components:   Prothrombin Time 16.2 (*)    INR 1.3 (*)    All other components within normal limits  BRAIN NATRIURETIC PEPTIDE - Abnormal; Notable for the following components:   B Natriuretic Peptide 2,812.9 (*)    All other components within normal limits  DIGOXIN LEVEL - Abnormal; Notable for the following components:   Digoxin Level <0.2 (*)    All other components within normal limits  BASIC METABOLIC PANEL - Abnormal; Notable for the following components:   Glucose, Bld  104 (*)    All other components within normal limits  TROPONIN I (HIGH SENSITIVITY) - Abnormal; Notable for the following components:   Troponin I (High Sensitivity) 20 (*)    All other components within normal limits  TROPONIN I (HIGH SENSITIVITY) - Abnormal; Notable for the following components:   Troponin I (High Sensitivity) 20 (*)    All other  components within normal limits  BASIC METABOLIC PANEL    EKG EKG Interpretation  Date/Time:  Friday June 10 2022 18:58:49 EDT Ventricular Rate:  90 PR Interval:    QRS Duration: 114 QT Interval:  380 QTC Calculation: 464 R Axis:   70 Text Interpretation: Undetermined rhythm ST & T wave abnormality, consider lateral ischemia Prolonged QT Abnormal ECG When compared with ECG of 10-Jun-2022 13:23, PREVIOUS ECG IS PRESENT similar to earlier in night Confirmed by Merrily Pew [phone]) on 06/10/2022 11:03:08 PM  Radiology  CT Head Wo Contrast  Result Date: 06/10/2022 CLINICAL DATA:  TIA, vertigo EXAM: CT HEAD WITHOUT CONTRAST TECHNIQUE: Contiguous axial images were obtained from the base of the skull through the vertex without intravenous contrast. RADIATION DOSE REDUCTION: This exam was performed according to the departmental dose-optimization program which includes automated exposure control, adjustment of the mA and/or kV according to patient size and/or use of iterative reconstruction technique. COMPARISON:  03/05/2015 FINDINGS: Brain: No acute intracranial findings are seen. There are no signs of bleeding within the cranium. There is large old infarct in right frontal, temporal and parietal lobes in right MCA distribution. Cortical sulci are prominent. There is no focal edema or mass effect. Vascular: Unremarkable. Skull: Unremarkable. Sinuses/Orbits: Unremarkable. Other: None. IMPRESSION: No acute intracranial findings are seen. There is large old infarct in right MCA distribution. Atrophy. No significant interval changes are noted.  Electronically Signed   By: Elmer Picker M.D.   On: 06/10/2022 16:43    Procedures .Critical Care  Performed by: Merrily Pew, MD Authorized by: Merrily Pew, MD   Critical care provider statement:    Critical care time (minutes):  30   Critical care was necessary to treat or prevent imminent or life-threatening deterioration of the following conditions:  Respiratory failure   Critical care was time spent personally by me on the following activities:  Development of treatment plan with patient or surrogate, discussions with consultants, evaluation of patient's response to treatment, examination of patient, ordering and review of laboratory studies, ordering and review of radiographic studies, ordering and performing treatments and interventions, pulse oximetry, re-evaluation of patient's condition and review of old charts     Medications Ordered in ED Medications  nitroGLYCERIN (NITROSTAT) SL tablet 0.4 mg (0.4 mg Sublingual Given 06/11/22 0530)  sodium chloride flush (NS) 0.9 % injection 3 mL (3 mLs Intravenous Given 06/11/22 2205)  acetaminophen (TYLENOL) tablet 650 mg (has no administration in time range)    Or  acetaminophen (TYLENOL) suppository 650 mg (has no administration in time range)  albuterol (PROVENTIL) (2.5 MG/3ML) 0.083% nebulizer solution 2.5 mg (has no administration in time range)  furosemide (LASIX) injection 40 mg (40 mg Intravenous Given 06/11/22 1747)  atorvastatin (LIPITOR) tablet 80 mg (80 mg Oral Given 06/11/22 1748)  carvedilol (COREG) tablet 6.25 mg (6.25 mg Oral Given 06/11/22 1748)  isosorbide mononitrate (IMDUR) 24 hr tablet 30 mg (30 mg Oral Given 06/11/22 1748)  sacubitril-valsartan (ENTRESTO) 97-103 mg per tablet (1 tablet Oral Given 06/11/22 2202)  apixaban (ELIQUIS) tablet 5 mg (5 mg Oral Given 06/11/22 1748)  famotidine (PEPCID) tablet 20 mg (20 mg Oral Given 06/11/22 2202)  loratadine (CLARITIN) tablet 10 mg (has no administration in time  range)  brimonidine (ALPHAGAN) 0.15 % ophthalmic solution 1 drop (1 drop Both Eyes Given 06/11/22 2203)  latanoprost (XALATAN) 0.005 % ophthalmic solution 1 drop (1 drop Both Eyes Given 06/11/22 2203)  ofloxacin (OCUFLOX) 0.3 % ophthalmic solution 1 drop (1 drop Left Eye  Given 06/11/22 2204)  ketorolac (ACULAR) 0.5 % ophthalmic solution 1 drop (1 drop Left Eye Not Given 06/11/22 1848)  digoxin (LANOXIN) tablet 0.125 mg (0.125 mg Oral Given 06/11/22 1748)  empagliflozin (JARDIANCE) tablet 25 mg (25 mg Oral Not Given 06/11/22 1848)  influenza vaccine adjuvanted (FLUAD) injection 0.5 mL (has no administration in time range)  furosemide (LASIX) injection 40 mg (40 mg Intravenous Given 06/11/22 1107)    ED Course/ Medical Decision Making/ A&P Clinical Course as of 06/11/22 2344  Sat Jun 11, 2022  0906 Chest x-ray reviewed and interpreted consistent with CHF [DR]    Clinical Course User Index [DR] Pattricia Boss, MD                           Medical Decision Making Amount and/or Complexity of Data Reviewed Labs: ordered.    Details: Cbc, bmp, troponin slightly elevated and flat.  Radiology: ordered and independent interpretation performed.    Details: Ct head w/ old stroke, no e/o new stroke  Risk Prescription drug management. Decision regarding hospitalization.   For vertigo and dizziness standpoint the patient appears fine.  CT scan is okay for any traumatic injury especially since he is on blood thinners.  No evidence of stroke either.  However when he did stand he got acutely hypoxic.  Does not have a history of hypoxia is normally 98 on previous office visits and he dropped around 87% on standing.  No dyspnea but does have mild Rales.  A little bit of tachypnea.  We will add on chest x-ray, BNP and a digoxin level.  Oxygen initiated.  Care transferred to oncoming provider to follow-up and likely admit for what ever the etiology ends up being the ultimate etiology.   Final Clinical  Impression(s) / ED Diagnoses Final diagnoses:  Acute on chronic congestive heart failure, unspecified heart failure type [facility])    Rx / DC Orders ED Discharge Orders     None         Montserrat Shek, Corene Cornea, MD 06/12/22 [phone]

## 2022-06-11 NOTE — ED Notes (Signed)
Place on 2 LPM via [place] while resting.

## 2022-06-11 NOTE — ED Notes (Signed)
ED TO INPATIENT HANDOFF REPORT    S Name/Age/Gender Vincent [name] 86 y.o. male Room/Bed: 022C/022C  Code Status   Code Status: Full Code  Home/SNF/Other Home Patient oriented to: self, place, time, and situation Is this baseline? Yes   Triage Complete: Triage complete  Chief Complaint Acute on chronic combined systolic and diastolic CHF (congestive heart failure) ([place]) [I50.43]  Triage Note Pt BIB [facility] from his workplace. Pt woke up with "vertigo" and sinus congestion this AM. EMS report pt was clammy and hypertensive. Pt did have a fall and denied hitting his head. Pt went to bed without symptoms last PM. Pt denies pain. Pt is anticoagulated on [place].   160/100 HR 90 RR 22 SpO2 96 CBG 102   Allergies No Known Allergies  Level of Care/Admitting Diagnosis ED Disposition     ED Disposition  Admit   Condition  --   Comment  Hospital Area: [facility] [[id number]]  Level of Care: Telemetry Cardiac [[id number]]  May admit patient to [facility] or [facility] if equivalent level of care is available:: No  Covid Evaluation: Asymptomatic - no recent exposure (last 10 days) testing not required  Diagnosis: Acute on chronic combined systolic and diastolic CHF (congestive heart failure) [facility]) [[id number]]  Admitting Physician: Norval Morton [[id number]]  Attending Physician: Norval Morton [[id number]]  Certification:: I certify this patient will need inpatient services for at least 2 midnights  Estimated Length of Stay: 3          B Medical/Surgery History Past Medical History:  Diagnosis Date   Alcoholic cardiomyopathy ([place])    Bradycardia    Chronic systolic CHF (congestive heart failure) ([place])    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 s/p remote stent to Cx 2004 with chronic stable angina in context of residual circumflex disease.   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/25/2015   NSVT (nonsustained ventricular tachycardia) (HCC)    Permanent atrial fibrillation (HCC)    Pulmonary eosinophilia (HCC)    Sinus bradycardia    Stroke [facility])    Syncope and collapse    Unspecified glaucoma(365.9)    Past Surgical History:  Procedure Laterality Date   CARDIAC CATHETERIZATION N/A 07/30/2015   Procedure: Left Heart Cath and Coronary Angiography;  Surgeon: Belva Crome, MD;  Location: [facility];  Service: Cardiovascular;  Laterality: N/A;   COLONOSCOPY     KNEE SURGERY     loop recorder  09/2007   RIGHT/LEFT HEART CATH AND CORONARY ANGIOGRAPHY N/A 09/20/2021   Procedure: RIGHT/LEFT HEART CATH AND CORONARY ANGIOGRAPHY;  Surgeon: Martinique, Peter M, MD;  Location: [place];  Service: Cardiovascular;  Laterality: N/A;     A IV Location/Drains/Wounds Patient Lines/Drains/Airways Status     Active Line/Drains/Airways     Name Placement date Placement time Site Days   Peripheral IV 06/11/22 20 G Anterior;Distal;Right;Upper Arm 06/11/22  1105  Arm  less than 1            Intake/Output Last 24  hours No intake or output data in the 24 hours ending 06/11/22 1318  Labs/Imaging Results for orders placed or performed during the hospital encounter of 06/10/22 (from the past 48 hour(s))  Basic metabolic panel     Status: Abnormal   Collection Time: 06/10/22  3:47 PM  Result Value Ref Range   Sodium 142 135 - 145 mmol/L   Potassium 4.1 3.5 - 5.1 mmol/L   Chloride 108 98 - 111 mmol/L   CO2 27 22 - 32 mmol/L   Glucose, Bld 121 (H) 70 - 99 mg/dL    Comment: Glucose reference range applies only to samples taken after fasting for at least 8  hours.   BUN 14 8 - 23 mg/dL   Creatinine, Ser 0.96 0.61 - 1.24 mg/dL   Calcium 9.2 8.9 - 10.3 mg/dL   GFR, Estimated >60 >60 mL/min    Comment: (NOTE) Calculated using the CKD-EPI Creatinine Equation (2021)    Anion gap 7 5 - 15    Comment: Performed at [facility] [address] [place], [place] [address]  Troponin I (High Sensitivity)     Status: Abnormal   Collection Time: 06/10/22  3:47 PM  Result Value Ref Range   Troponin I (High Sensitivity) 20 (H) <18 ng/L    Comment: (NOTE) Elevated high sensitivity troponin I (hsTnI) values and significant  changes across serial measurements may suggest ACS but many other  chronic and acute conditions are known to elevate hsTnI results.  Refer to the "Links" section for chest pain algorithms and additional  guidance. Performed at [facility], [place] [address], [place], [place] [address]   CBC with Differential     Status: Abnormal   Collection Time: 06/10/22  3:47 PM  Result Value Ref Range   WBC 3.9 (L) 4.0 - 10.5 K/uL   RBC 4.59 4.22 - 5.81 MIL/uL   Hemoglobin 12.1 (L) 13.0 - 17.0 g/dL   HCT 39.2 39.0 - 52.0 %   MCV 85.4 80.0 - 100.0 fL   MCH 26.4 26.0 - 34.0 pg   MCHC 30.9 30.0 - 36.0 g/dL   RDW 18.2 (H) 11.5 - 15.5 %   Platelets 231 150 - 400 K/uL   nRBC 0.0 0.0 - 0.2 %   Neutrophils Relative % 80 %   Neutro Abs 3.2 1.7 - 7.7 K/uL   Lymphocytes Relative 10 %   Lymphs Abs 0.4 (L) 0.7 - 4.0 K/uL   Monocytes Relative 7 %   Monocytes Absolute 0.3 0.1 - 1.0 K/uL   Eosinophils Relative 1 %   Eosinophils Absolute 0.0 0.0 - 0.5 K/uL   Basophils Relative 1 %   Basophils Absolute 0.0 0.0 - 0.1 K/uL   Immature Granulocytes 1 %   Abs Immature Granulocytes 0.02 0.00 - 0.07 K/uL    Comment: Performed at [facility] [address], [place], [place] [address]  Protime-INR     Status: Abnormal   Collection Time: 06/10/22  3:47 PM  Result Value Ref Range   Prothrombin Time 16.2 (H) 11.4 - 15.2 seconds   INR 1.3  (H) 0.8 - 1.2    Comment: (NOTE) INR goal varies based on device and disease states. Performed at [facility], [place] [address] [place], [place] [address]   Troponin I (High Sensitivity)     Status: Abnormal   Collection Time: 06/10/22  6:57 PM  Result Value Ref Range   Troponin I (High Sensitivity) 20 (  H) <18 ng/L    Comment: (NOTE) Elevated high sensitivity troponin I (hsTnI) values and significant  changes across serial measurements may suggest ACS but many other  chronic and acute conditions are known to elevate hsTnI results.  Refer to the "Links" section for chest pain algorithms and additional  guidance. Performed at [facility], [place] [address], [place], [place] [address]   Brain natriuretic peptide     Status: Abnormal   Collection Time: 06/11/22  5:04 AM  Result Value Ref Range   B Natriuretic Peptide 2,812.9 (H) 0.0 - 100.0 pg/mL    Comment: Performed at [facility] [address] [place], [place] [address]  Digoxin level     Status: Abnormal   Collection Time: 06/11/22  6:20 AM  Result Value Ref Range   Digoxin Level <0.2 (L) 0.8 - 2.0 ng/mL    Comment: RESULT CONFIRMED BY MANUAL DILUTION Performed at [facility], [place] [address] [place], [place] [address]    DG Chest Portable 1 View  Result Date: 06/11/2022 CLINICAL DATA:  Hypoxia EXAM: PORTABLE CHEST 1 VIEW COMPARISON:  09/17/2021 FINDINGS: Chronic cardiomegaly. Prominence of hilar vessels with diffuse interstitial opacity and cephalized blood flow. No visible effusion or pneumothorax. Cardiac device over the left chest. IMPRESSION: CHF pattern. Electronically Signed   By: Jorje Guild M.D.   On: 06/11/2022 07:59   CT Head Wo Contrast  Result Date: 06/10/2022 CLINICAL DATA:  TIA, vertigo EXAM: CT HEAD WITHOUT CONTRAST TECHNIQUE: Contiguous axial images were obtained from the base of the skull through the vertex without intravenous contrast. RADIATION DOSE REDUCTION: This exam was  performed according to the departmental dose-optimization program which includes automated exposure control, adjustment of the mA and/or kV according to patient size and/or use of iterative reconstruction technique. COMPARISON:  03/05/2015 FINDINGS: Brain: No acute intracranial findings are seen. There are no signs of bleeding within the cranium. There is large old infarct in right frontal, temporal and parietal lobes in right MCA distribution. Cortical sulci are prominent. There is no focal edema or mass effect. Vascular: Unremarkable. Skull: Unremarkable. Sinuses/Orbits: Unremarkable. Other: None. IMPRESSION: No acute intracranial findings are seen. There is large old infarct in right MCA distribution. Atrophy. No significant interval changes are noted. Electronically Signed   By: Elmer Picker M.D.   On: 06/10/2022 16:43    Pending Labs Unresulted Labs (From admission, onward)     Start     Ordered   06/11/22 7915  Basic metabolic panel  Daily at 5am,   R      06/11/22 0959            Vitals/Pain Today's Vitals   06/11/22 0845 06/11/22 0900 06/11/22 0915 06/11/22 1118  BP: 128/82 128/63 137/85   Pulse:      Resp: 17 (!) 24 (!) 24   Temp:    98.4 F (36.9 C)  TempSrc:    Oral  SpO2:      Weight:      Height:      PainSc:        Isolation Precautions No active isolations  Medications Medications  nitroGLYCERIN (NITROSTAT) SL tablet 0.4 mg (0.4 mg Sublingual Given 06/11/22 0530)  sodium chloride flush (NS) 0.9 % injection 3 mL (3 mLs Intravenous Given 06/11/22 1111)  acetaminophen (TYLENOL) tablet 650 mg (has no administration in time range)    Or  acetaminophen (TYLENOL) suppository 650 mg (has no administration in time range)  albuterol (  PROVENTIL) (2.5 MG/3ML) 0.083% nebulizer solution 2.5 mg (has no administration in time range)  furosemide (LASIX) injection 40 mg (has no administration in time range)  furosemide (LASIX) injection 40 mg (40 mg Intravenous Given  06/11/22 1107)    Mobility walks with device Low fall risk      R Recommendations: See Admitting Provider Note

## 2022-06-12 ENCOUNTER — Other Ambulatory Visit: Payer: Self-pay

## 2022-06-12 ENCOUNTER — Encounter (HOSPITAL_COMMUNITY): Payer: Self-pay | Admitting: Internal Medicine

## 2022-06-12 DIAGNOSIS — I5043 Acute on chronic combined systolic (congestive) and diastolic (congestive) heart failure: Secondary | ICD-10-CM | POA: Diagnosis not present

## 2022-06-12 LAB — BASIC METABOLIC PANEL
Anion gap: 9 (ref 5–15)
BUN: 15 mg/dL (ref 8–23)
CO2: 30 mmol/L (ref 22–32)
Calcium: 8.9 mg/dL (ref 8.9–10.3)
Chloride: 103 mmol/L (ref 98–111)
Creatinine, Ser: 1.07 mg/dL (ref 0.61–1.24)
GFR, Estimated: >60 mL/min (ref >60)
Glucose, Bld: 105 mg/dL — ABNORMAL HIGH (ref 70–99)
Potassium: 3.5 mmol/L (ref 3.5–5.1)
Sodium: 142 mmol/L (ref 135–145)

## 2022-06-12 MED ORDER — POTASSIUM CHLORIDE CRYS ER 20 MEQ PO TBCR
40.0000 meq | EXTENDED_RELEASE_TABLET | Freq: Once | ORAL | Status: AC
  Administered 2022-06-12: 40 meq via ORAL
  Filled 2022-06-12: qty 2

## 2022-06-12 MED ORDER — INFLUENZA VAC A&B SA ADJ QUAD 0.5 ML IM PRSY
0.5000 mL | PREFILLED_SYRINGE | INTRAMUSCULAR | Status: AC
  Administered 2022-06-13: 0.5 mL via INTRAMUSCULAR
  Filled 2022-06-12: qty 0.5

## 2022-06-12 NOTE — Progress Notes (Addendum)
PROGRESS NOTE    Vincent [name]  SWN:[id number] DOB: Dec 08, 1935 DOA: 06/10/2022 PCP: [name], MD   Vincent [name] is a 86/M with history of chronic systolic CHF, EF 93-81%, permanent atrial fibrillation on anticoagulation, CAD, type 2 diabetes mellitus, CVA, left foot drop, and chronic wounds presented with complaints of dizziness. 10/14, while he was try to get lunch he reported feeling dizzy that he describes as feeling funny in his head with sinus congestion.  He lost his footing and reported falling onto a small refrigerator with the microwave on top knocking it over and hitting a chair.  Denied any loss of consciousness or trauma.  Patient was unable to get up despite trying for some time prior being able to call for assistance.  He is followed by wound care for chronic wounds of his lower extremities.   In The ED,  afebrile with heart rate 61-1 49, respirations 16-27, blood pressures maintained, and O2 saturations noted to be as low as 80% with improvement on 2 L of nasal cannula oxygen.  Labs -WBC 3.9, hemoglobin 12.1, digoxin level <0.2, high-sensitivity troponin 20-> 20, and BNP 2812.9.  CT scan of the head did not note any acute abnormality with a large old infarct of right MCA.  Chest x-ray noted CHF pattern.    Subjective: Feels tired, but ok otherwise  Assessment and Plan:   Dizziness Fall  Patient reported having episode of dizziness 10/14 where he felt off balance causing him to fall.  no LoC -Orthostatics negative, telemetry with A-fib -Cards following, PT eval-quite unsteady, declines rehab -Increase activity today  Acute on chronic systolic CHF -Hypoxic at the time of evaluation in the ED, 80% on room air, now on few liters, BNP 2912, chest x-ray with CHF pattern -Last echocardiogram noted EF to be 25-30% with global hypokinesis of the left ventricle 03/2022.   -continue IV lasix 1 more day, wean off O2 -Continue Entresto, Jardiance, Coreg -Cardiology following    Persistent atrial fibrillation on chronic anticoagulation -Rate controlled -Continue Coreg  and Eliquis -Resume digoxin   Essential hypertension -Continue Coreg, Entresto, and isosorbide mononitrate -Orthostatics negative, BP stable   CAD Prior history of PCI to RCA remotely.  Last heart cath 1/23 noted patent RCA stent with 70% stenosis distal left circumflex. -Continue isosorbide mononitrate and atorvastatin   Controlled diabetes mellitus type 2, without long-term use of insulin Last hemoglobin A1c 7 on 09/17/2021.   -Continue Jardiance, hold metformin   Hyperlipidemia -Continue atorvastatin   History of stroke Patient with prior history of right MCA with left foot drop. -PT to evaluate and treat.   Chronic lower extremity ulcers -Continue outpatient follow-up with wound clinic   Advance Care Planning:   Code Status: Full Code     Consults: Cardiology Family Communication: Discussed with patient detail, no family at bedside Disposition Plan: Home in 1 to 2 days  Consultants: Cardiology   Procedures:   Antimicrobials:    Objective: Vitals:   06/11/22 1500 06/11/22 1539 06/11/22 1936 06/12/22 0330  BP: 132/73  114/78 104/61  Pulse:   88 (!) 42  Resp:   18   Temp:   98 F (36.7 C) 98 F (36.7 C)  TempSrc:   Oral Oral  SpO2:   94% 94%  Weight:  81.2 kg  80 kg  Height:  '5\' 9"'$  (1.753 m)      Intake/Output Summary (Last 24 hours) at 06/12/2022 1030 Last data filed at 06/12/2022 0913 Gross per 24  hour  Intake 360 ml  Output 3400 ml  Net -3040 ml   Filed Weights   06/10/22 1538 06/11/22 1539 06/12/22 0330  Weight: 79.8 kg 81.2 kg 80 kg    Examination:  General exam: Chronically ill elderly male sitting up in bed, AAOx3, no distress Respiratory system: Decreased breath sounds to bases Cardiovascular system: S1 & S2 heard, irregularly irregular rhythm Abd: nondistended, soft and nontender.Normal bowel sounds heard. Central nervous system: Alert and  oriented. No focal neurological deficits. Extremities: no edema, chronic wounds on both legs with dressing Skin: As above Psychiatry:  Mood & affect appropriate.     Data Reviewed:   CBC: Recent Labs  Lab 06/10/22 1547  WBC 3.9*  NEUTROABS 3.2  HGB 12.1*  HCT 39.2  MCV 85.4  PLT 878   Basic Metabolic Panel: Recent Labs  Lab 06/10/22 1547 06/11/22 1656 06/12/22 0101  NA 142 141 142  K 4.1 3.8 3.5  CL 108 106 103  CO2 '27 28 30  '$ GLUCOSE 121* 104* 105*  BUN '14 14 15  '$ CREATININE 0.96 0.98 1.07  CALCIUM 9.2 9.0 8.9   GFR: Estimated Creatinine Clearance: 51.4 mL/min (by C-G formula based on SCr of 1.07 mg/dL). Liver Function Tests: No results for input(s): "AST", "ALT", "ALKPHOS", "BILITOT", "PROT", "ALBUMIN" in the last 168 hours. No results for input(s): "LIPASE", "AMYLASE" in the last 168 hours. No results for input(s): "AMMONIA" in the last 168 hours. Coagulation Profile: Recent Labs  Lab 06/10/22 1547  INR 1.3*   Cardiac Enzymes: No results for input(s): "CKTOTAL", "CKMB", "CKMBINDEX", "TROPONINI" in the last 168 hours. BNP (last 3 results) No results for input(s): "PROBNP" in the last 8760 hours. HbA1C: No results for input(s): "HGBA1C" in the last 72 hours. CBG: No results for input(s): "GLUCAP" in the last 168 hours. Lipid Profile: No results for input(s): "CHOL", "HDL", "LDLCALC", "TRIG", "CHOLHDL", "LDLDIRECT" in the last 72 hours. Thyroid Function Tests: No results for input(s): "TSH", "T4TOTAL", "FREET4", "T3FREE", "THYROIDAB" in the last 72 hours. Anemia Panel: No results for input(s): "VITAMINB12", "FOLATE", "FERRITIN", "TIBC", "IRON", "RETICCTPCT" in the last 72 hours. Urine analysis:    Component Value Date/Time   COLORURINE STRAW (A) 03/05/2017 1120   APPEARANCEUR CLEAR 03/05/2017 1120   LABSPEC 1.005 03/05/2017 1120   PHURINE 6.0 03/05/2017 1120   GLUCOSEU NEGATIVE 03/05/2017 1120   HGBUR NEGATIVE 03/05/2017 1120   BILIRUBINUR NEGATIVE  03/05/2017 1120   KETONESUR NEGATIVE 03/05/2017 1120   PROTEINUR NEGATIVE 03/05/2017 1120   UROBILINOGEN 0.2 03/05/2015 1115   NITRITE NEGATIVE 03/05/2017 1120   LEUKOCYTESUR NEGATIVE 03/05/2017 1120   Sepsis Labs: '@LABRCNTIP'$ (procalcitonin:4,lacticidven:4)  )No results found for this or any previous visit (from the past 240 hour(s)).   Radiology Studies: DG Chest Portable 1 View  Result Date: 06/11/2022 CLINICAL DATA:  Hypoxia EXAM: PORTABLE CHEST 1 VIEW COMPARISON:  09/17/2021 FINDINGS: Chronic cardiomegaly. Prominence of hilar vessels with diffuse interstitial opacity and cephalized blood flow. No visible effusion or pneumothorax. Cardiac device over the left chest. IMPRESSION: CHF pattern. Electronically Signed   By: Jorje Guild M.D.   On: 06/11/2022 07:59   CT Head Wo Contrast  Result Date: 06/10/2022 CLINICAL DATA:  TIA, vertigo EXAM: CT HEAD WITHOUT CONTRAST TECHNIQUE: Contiguous axial images were obtained from the base of the skull through the vertex without intravenous contrast. RADIATION DOSE REDUCTION: This exam was performed according to the departmental dose-optimization program which includes automated exposure control, adjustment of the mA and/or kV according to patient size  and/or use of iterative reconstruction technique. COMPARISON:  03/05/2015 FINDINGS: Brain: No acute intracranial findings are seen. There are no signs of bleeding within the cranium. There is large old infarct in right frontal, temporal and parietal lobes in right MCA distribution. Cortical sulci are prominent. There is no focal edema or mass effect. Vascular: Unremarkable. Skull: Unremarkable. Sinuses/Orbits: Unremarkable. Other: None. IMPRESSION: No acute intracranial findings are seen. There is large old infarct in right MCA distribution. Atrophy. No significant interval changes are noted. Electronically Signed   By: Elmer Picker M.D.   On: 06/10/2022 16:43     Scheduled Meds:  apixaban  5 mg  Oral BID   atorvastatin  80 mg Oral Daily   brimonidine  1 drop Both Eyes TID   carvedilol  6.25 mg Oral BID WC   digoxin  0.125 mg Oral Daily   empagliflozin  25 mg Oral Daily   famotidine  20 mg Oral BID   furosemide  40 mg Intravenous BID   influenza vaccine adjuvanted  0.5 mL Intramuscular Tomorrow-1000   isosorbide mononitrate  30 mg Oral Daily   ketorolac  1 drop Left Eye Daily   latanoprost  1 drop Both Eyes QHS   loratadine  10 mg Oral Daily   ofloxacin  1 drop Left Eye QID   sacubitril-valsartan  1 tablet Oral BID   sodium chloride flush  3 mL Intravenous Q12H   Continuous Infusions:   LOS: 1 day    Time spent: 35mn    PDomenic Polite MD [facility]   06/12/2022, 10:30 AM

## 2022-06-12 NOTE — TOC Initial Note (Signed)
Transition of Care [facility]) - Initial/Assessment Note    Patient Details  Name: Vincent [name] MRN: [id number] Date of Birth: March 26, 1936  Transition of Care [facility]) CM/SW Contact:    Bary Castilla, LCSW Phone Number: 06/12/2022, 11:32 AM  Clinical Narrative:                 CSW met with patient to discuss PT recommendation of a SNF. Patient stated that he did not want to go to a rehab facility. CSW inquired about pt having HH come in do rehab. Pt stated that he did not want HH either. Pt informed CSW that he want OP rehab. CSW inquired he could transport him and he stated his sister. CSW confirmed address on faceheet and that pt lived alone. CSW explained to pt the goal is for pt have keep pt safe from falling and he explained that he tripped.  TOC team will continue to assist with discharge planning needs.   Expected Discharge Plan: OP Rehab Barriers to Discharge: Continued Medical Work up   Patient Goals and CMS Choice        Expected Discharge Plan and Services Expected Discharge Plan: OP Rehab       Living arrangements for the past 2 months: Single Family Home                                      Prior Living Arrangements/Services Living arrangements for the past 2 months: Single Family Home Lives with:: Self Patient language and need for interpreter reviewed:: Yes Do you feel safe going back to the place where you live?: Yes               Activities of Daily Living Home Assistive Devices/Equipment: CBG Meter, Scales ADL Screening (condition at time of admission) Patient's cognitive ability adequate to safely complete daily activities?: Yes Is the patient deaf or have difficulty hearing?: No Does the patient have difficulty seeing, even when wearing glasses/contacts?: No Does the patient have difficulty concentrating, remembering, or making decisions?: No Patient able to express need for assistance with ADLs?: Yes Does the patient have difficulty dressing  or bathing?: No Independently performs ADLs?: Yes (appropriate for developmental age) Does the patient have difficulty walking or climbing stairs?: No Weakness of Legs: None Weakness of Arms/Hands: None  Permission Sought/Granted   Permission granted to share information with : Yes, Verbal Permission Granted  Share Information with NAME: [name]  Permission granted to share info w AGENCY: Outpatient Rehab  Permission granted to share info w Relationship: Sister  Permission granted to share info w Contact Information: [phone]  Emotional Assessment   Attitude/Demeanor/Rapport: Engaged, Ambitious, Self-Confident Affect (typically observed): Accepting, Adaptable Orientation: : Oriented to Self, Oriented to Place, Oriented to  Time, Oriented to Situation      Admission diagnosis:  Acute on chronic combined systolic and diastolic CHF (congestive heart failure) (HCC) [I50.43] Acute on chronic congestive heart failure, unspecified heart failure type [facility]) [I50.9] Patient Active Problem List   Diagnosis Date Noted   Acute on chronic combined systolic and diastolic CHF (congestive heart failure) ([place]) 06/11/2022   Acute respiratory failure with hypoxia ([place]) 06/11/2022   Fall 06/11/2022   Controlled type 2 diabetes mellitus without complication, without long-term current use of insulin ([place]) 06/11/2022   Acquired thrombophilia ([place]) 10/11/2021   Benign prostatic hyperplasia 10/11/2021   Diabetic peripheral neuropathy ([place]) 10/11/2021  Gout 10/11/2021   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 (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/25/2015   Hemiparesis and alteration of sensations as late effects of stroke ([place]) 09/25/2015   Abnormal stress test 07/21/2015   Foot drop, left 03/11/2015   Gait disorder 03/11/2015   Precordial pain 03/19/2014   Dizziness 03/17/2014   Near syncope 03/17/2014   Encounter for therapeutic drug monitoring 09/24/2013   Rectal bleeding 10/19/2012   External hemorrhoid, bleeding 10/19/2012   Long term current use of anticoagulant 10/12/2010   GLAUCOMA 09/17/2010   GERD 09/17/2010   HEMORRHAGE OF RECTUM AND ANUS 09/17/2010   BRADYCARDIA 06/09/2010   VENTRICULAR TACHYCARDIA 12/30/2008   SYNCOPE 12/30/2008   PULMONARY INFILTRATE INCLUDES (EOSINOPHILIA) 12/28/2007   HLD (hyperlipidemia) 12/27/2007   Essential hypertension 12/27/2007   Coronary atherosclerosis 38/93/7342   Alcoholic cardiomyopathy ([place]) 12/27/2007   Persistent atrial fibrillation ([place]) 12/27/2007   Cerebral artery occlusion with cerebral infarction ([place]) 12/27/2007   COUGH 12/27/2007   PCP:  [name], MD Pharmacy:   [facility] [address], [place] [address] Phone: [phone] Fax: [phone]  [facility] [address] [place] [address] Phone: [phone] Fax: [place], [employer] [place] [address] Phone: [phone] Fax: [phone]     Social Determinants of Health (SDOH) Interventions    Readmission Risk Interventions   No data to display

## 2022-06-12 NOTE — Progress Notes (Signed)
Rounding Note    Patient Name: Vincent [name] Date of Encounter: 06/12/2022  [place] Cardiologist: Ena Dawley, MD   Subjective   Patient feeling better  No dizzienss  No CP   Inpatient Medications    Scheduled Meds:  apixaban  5 mg Oral BID   atorvastatin  80 mg Oral Daily   brimonidine  1 drop Both Eyes TID   carvedilol  6.25 mg Oral BID WC   digoxin  0.125 mg Oral Daily   empagliflozin  25 mg Oral Daily   famotidine  20 mg Oral BID   furosemide  40 mg Intravenous BID   influenza vaccine adjuvanted  0.5 mL Intramuscular Tomorrow-1000   isosorbide mononitrate  30 mg Oral Daily   ketorolac  1 drop Left Eye Daily   latanoprost  1 drop Both Eyes QHS   loratadine  10 mg Oral Daily   ofloxacin  1 drop Left Eye QID   sacubitril-valsartan  1 tablet Oral BID   sodium chloride flush  3 mL Intravenous Q12H   Continuous Infusions:  PRN Meds: acetaminophen **OR** acetaminophen, albuterol, nitroGLYCERIN   Vital Signs    Vitals:   06/11/22 1500 06/11/22 1539 06/11/22 1936 06/12/22 0330  BP: 132/73  114/78 104/61  Pulse:   88 (!) 42  Resp:   18   Temp:   98 F (36.7 C) 98 F (36.7 C)  TempSrc:   Oral Oral  SpO2:   94% 94%  Weight:  81.2 kg  80 kg  Height:  '5\' 9"'$  (1.753 m)     Orthostatic BP110/74  P 90   Laying   146/79 P 95 sitting   150/88 P 109 standing  Intake/Output Summary (Last 24 hours) at 06/12/2022 0729 Last data filed at 06/12/2022 0300 Gross per 24 hour  Intake 240 ml  Output 3400 ml  Net -3160 ml      06/12/2022    3:30 AM 06/11/2022    3:39 PM 06/10/2022    3:38 PM  Last 3 Weights  Weight (lbs) 176 lb 5.9 oz 179 lb 0.2 oz 176 lb  Weight (kg) 80 kg 81.2 kg 79.833 kg      Telemetry    SR  2 6 beat NSVT  - Personally Reviewed  ECG    No new  - Personally Reviewed  Physical Exam   GEN: No acute distress.  COmfortable laying flat   Neck: No JVD Cardiac: RRR, no murmurs, Respiratory: Clear to auscultation  bilaterally. GI: Soft, nontender, non-distended  MS: No edema; No deformity. Neuro:  Nonfocal  Psych: Normal affect   Labs    High Sensitivity Troponin:   Recent Labs  Lab 06/10/22 1547 06/10/22 1857  TROPONINIHS 20* 20*     Chemistry Recent Labs  Lab 06/10/22 1547 06/11/22 1656 06/12/22 0101  NA 142 141 142  K 4.1 3.8 3.5  CL 108 106 103  CO2 '27 28 30  '$ GLUCOSE 121* 104* 105*  BUN '14 14 15  '$ CREATININE 0.96 0.98 1.07  CALCIUM 9.2 9.0 8.9  GFRNONAA >60 >60 >60  ANIONGAP '7 7 9    '$ Lipids No results for input(s): "CHOL", "TRIG", "HDL", "LABVLDL", "LDLCALC", "CHOLHDL" in the last 168 hours.  Hematology Recent Labs  Lab 06/10/22 1547  WBC 3.9*  RBC 4.59  HGB 12.1*  HCT 39.2  MCV 85.4  MCH 26.4  MCHC 30.9  RDW 18.2*  PLT 231   Thyroid No results for input(s): "TSH", "  FREET4" in the last 168 hours.  BNP Recent Labs  Lab 06/11/22 0504  BNP 2,812.9*    DDimer No results for input(s): "DDIMER" in the last 168 hours.   Radiology    DG Chest Portable 1 View  Result Date: 06/11/2022 CLINICAL DATA:  Hypoxia EXAM: PORTABLE CHEST 1 VIEW COMPARISON:  09/17/2021 FINDINGS: Chronic cardiomegaly. Prominence of hilar vessels with diffuse interstitial opacity and cephalized blood flow. No visible effusion or pneumothorax. Cardiac device over the left chest. IMPRESSION: CHF pattern. Electronically Signed   By: Jorje Guild M.D.   On: 06/11/2022 07:59   CT Head Wo Contrast  Result Date: 06/10/2022 CLINICAL DATA:  TIA, vertigo EXAM: CT HEAD WITHOUT CONTRAST TECHNIQUE: Contiguous axial images were obtained from the base of the skull through the vertex without intravenous contrast. RADIATION DOSE REDUCTION: This exam was performed according to the departmental dose-optimization program which includes automated exposure control, adjustment of the mA and/or kV according to patient size and/or use of iterative reconstruction technique. COMPARISON:  03/05/2015 FINDINGS: Brain: No  acute intracranial findings are seen. There are no signs of bleeding within the cranium. There is large old infarct in right frontal, temporal and parietal lobes in right MCA distribution. Cortical sulci are prominent. There is no focal edema or mass effect. Vascular: Unremarkable. Skull: Unremarkable. Sinuses/Orbits: Unremarkable. Other: None. IMPRESSION: No acute intracranial findings are seen. There is large old infarct in right MCA distribution. Atrophy. No significant interval changes are noted. Electronically Signed   By: Elmer Picker M.D.   On: 06/10/2022 16:43    Cardiac Studies   Echo   03/29/22  Left ventricular ejection fraction, by estimation, is 25 to 30%. The left ventricle has severely decreased function. The left ventricle demonstrates global hypokinesis. The left ventricular internal cavity size was moderately to severely dilated. Left ventricular diastolic function could not be evaluated. 1. Right ventricular systolic function is mildly reduced. The right ventricular size is mildly enlarged. There is mildly elevated pulmonary artery systolic pressure. The estimated right ventricular systolic pressure is 81.0 mmHg. 2. 3. Left atrial size was moderately dilated. 4. Right atrial size was moderately dilated. 5. The mitral valve is normal in structure. Mild to moderate mitral valve regurgitation. The aortic valve is tricuspid. Aortic valve regurgitation is not visualized. No aortic stenosis is present. 6. The inferior vena cava is normal in size with greater than 50% respiratory variability, suggesting right atrial pressure of 3 mmHg. 7. Comparison(s): There may be a slight improvement in left ventricular contractility compared to 09/18/2021.  Patient Profile     86 y.o. male   Assessment & Plan   1  Dizziness  Pt had orthostatics  BP went as did HR (up 20 pts with standing)  ? Some deconditioning     He was hypoxic on admit   ? If hypoxia   Follow stats    Agree  with PT   2  HFrEF Pt has diuresed 3 L   Looks better   Very comfortable flat Urine is still very pale though  Agree with lasxi today  Continue other meds   Need to ambulate and make sure doesn't desat  with walking on RA   3  Troponin eleatoin  Flat, minor   May be due to hypoxia  4  Afib  Permanent   Continue ELiquis and rate control  5  HTN   BP is 99  Agree with last dose lasix today  the nfollow  6  HL    On statin       For questions or updates, please contact [place] Please consult [email or web address] for contact info under        Signed, Dorris Carnes, MD  06/12/2022, 7:29 AM

## 2022-06-13 DIAGNOSIS — I10 Essential (primary) hypertension: Secondary | ICD-10-CM

## 2022-06-13 DIAGNOSIS — I4821 Permanent atrial fibrillation: Secondary | ICD-10-CM | POA: Diagnosis not present

## 2022-06-13 DIAGNOSIS — I5043 Acute on chronic combined systolic (congestive) and diastolic (congestive) heart failure: Secondary | ICD-10-CM | POA: Diagnosis not present

## 2022-06-13 DIAGNOSIS — R42 Dizziness and giddiness: Secondary | ICD-10-CM

## 2022-06-13 LAB — BASIC METABOLIC PANEL
Anion gap: 8 (ref 5–15)
BUN: 18 mg/dL (ref 8–23)
CO2: 30 mmol/L (ref 22–32)
Calcium: 8.8 mg/dL — ABNORMAL LOW (ref 8.9–10.3)
Chloride: 100 mmol/L (ref 98–111)
Creatinine, Ser: 1.05 mg/dL (ref 0.61–1.24)
GFR, Estimated: >60 mL/min (ref >60)
Glucose, Bld: 130 mg/dL — ABNORMAL HIGH (ref 70–99)
Potassium: 3.5 mmol/L (ref 3.5–5.1)
Sodium: 138 mmol/L (ref 135–145)

## 2022-06-13 LAB — CBC
HCT: 37.5 % — ABNORMAL LOW (ref 39.0–52.0)
Hemoglobin: 11.8 g/dL — ABNORMAL LOW (ref 13.0–17.0)
MCH: 26.2 pg (ref 26.0–34.0)
MCHC: 31.5 g/dL (ref 30.0–36.0)
MCV: 83.1 fL (ref 80.0–100.0)
Platelets: 219 10*3/uL (ref 150–400)
RBC: 4.51 MIL/uL (ref 4.22–5.81)
RDW: 17.9 % — ABNORMAL HIGH (ref 11.5–15.5)
WBC: 4.4 10*3/uL (ref 4.0–10.5)
nRBC: 0 % (ref 0.0–0.2)

## 2022-06-13 MED ORDER — POTASSIUM CHLORIDE CRYS ER 20 MEQ PO TBCR: 40.0000 meq | EXTENDED_RELEASE_TABLET | Freq: Two times a day (BID) | ORAL | Status: DC

## 2022-06-13 MED ORDER — FUROSEMIDE 40 MG PO TABS
40.0000 mg | ORAL_TABLET | Freq: Every day | ORAL | Status: DC
  Administered 2022-06-14 – 2022-06-15 (×2): 40 mg via ORAL
  Filled 2022-06-13 (×2): qty 1

## 2022-06-13 MED ORDER — SPIRONOLACTONE 12.5 MG HALF TABLET
12.5000 mg | ORAL_TABLET | Freq: Every day | ORAL | Status: DC
  Administered 2022-06-13 – 2022-06-15 (×3): 12.5 mg via ORAL
  Filled 2022-06-13 (×3): qty 1

## 2022-06-13 MED ORDER — POTASSIUM CHLORIDE CRYS ER 20 MEQ PO TBCR
40.0000 meq | EXTENDED_RELEASE_TABLET | Freq: Two times a day (BID) | ORAL | Status: AC
  Administered 2022-06-13: 40 meq via ORAL
  Filled 2022-06-13: qty 2

## 2022-06-13 MED ORDER — AQUAPHOR EX OINT
TOPICAL_OINTMENT | CUTANEOUS | Status: DC | PRN
  Filled 2022-06-13: qty 50

## 2022-06-13 NOTE — Progress Notes (Signed)
PROGRESS NOTE    [name]  DUK:[id number] DOB: December 17, 1935 DOA: 06/10/2022 PCP: [name], MD   Vincent [name] is a 86/M with history of chronic systolic CHF, EF 37-62%, permanent atrial fibrillation on anticoagulation, CAD, type 2 diabetes mellitus, CVA, left foot drop, and chronic wounds presented with complaints of dizziness. 10/14, while he was try to get lunch he reported feeling dizzy that he describes as feeling funny in his head with sinus congestion.  He lost his footing and reported falling onto a small refrigerator with the microwave on top knocking it over and hitting a chair.  Denied any loss of consciousness or trauma.  Patient was unable to get up despite trying for some time prior being able to call for assistance.  He is followed by wound care for chronic wounds of his lower extremities.   In [facility],  afebrile with heart rate 61-1 49, respirations 16-27, blood pressures maintained, and O2 saturations noted to be as low as 80% with improvement on 2 L of nasal cannula oxygen.  Labs -WBC 3.9, hemoglobin 12.1, digoxin level <0.2, high-sensitivity troponin 20-> 20, and BNP 2812.9.  CT scan of the head did not note any acute abnormality with a large old infarct of right MCA.  Chest x-ray noted CHF pattern.    Subjective: -Feels okay overall, denies dizziness, breathing better  Assessment and Plan:   Dizziness Fall  Patient reported having episode of dizziness 10/14 where he felt off balance causing him to fall.  no LoC -Orthostatics negative, telemetry with A-fib -Cards following, PT eval-quite unsteady, declines rehab -Ambulate with PT today  Acute on chronic systolic CHF -Hypoxic at the time of evaluation in the ED, 80% on room air, now on few liters, BNP 2912, chest x-ray with CHF pattern -Last echocardiogram noted EF to be 25-30% with global hypokinesis of the left ventricle 03/2022.   -Improving, diuresed with IV Lasix he is 7.4 L negative, switch to oral diuretics  tomorrow -Continue Delene Loll, Jardiance, Coreg, digoxin, starting Aldactone -Cardiology following -Discharge planning   Persistent atrial fibrillation on chronic anticoagulation -Rate controlled -Continue Coreg  and Eliquis -Resume digoxin   Essential hypertension -Continue Coreg, Entresto, and isosorbide mononitrate -Orthostatics negative, BP stable   CAD Prior history of PCI to RCA remotely.  Last heart cath 1/23 noted patent RCA stent with 70% stenosis distal left circumflex. -Continue isosorbide mononitrate and atorvastatin   Controlled diabetes mellitus type 2, without long-term use of insulin Last hemoglobin A1c 7 on 09/17/2021.   -Continue Jardiance, hold metformin   Hyperlipidemia -Continue atorvastatin   History of stroke Patient with prior history of right MCA with left foot drop. -Continue Eliquis   Chronic lower extremity ulcers -Continue outpatient follow-up with wound clinic -WOC consult, reportedly missed recent appointment   Advance Care Planning:   Code Status: Full Code   Family Communication: Discussed with patient detail, no family at bedside Disposition Plan: Home tomorrow, declines rehab  Consultants: Cardiology   Procedures:   Antimicrobials:    Objective: Vitals:   06/12/22 1800 06/12/22 1924 06/13/22 0312 06/13/22 0816  BP:  118/87 101/60 (!) 129/90  Pulse:  84 83 (!) 114  Resp:  '18 18 18  '$ Temp:  98.6 F (37 C) 98.2 F (36.8 C) 98.3 F (36.8 C)  TempSrc:  Oral Oral Oral  SpO2: 100% 99% 90% 95%  Weight:   78.4 kg   Height:        Intake/Output Summary (Last 24 hours) at  06/13/2022 1057 Last data filed at 06/12/2022 2300 Gross per 24 hour  Intake 360 ml  Output 4800 ml  Net -4440 ml   Filed Weights   06/11/22 1539 06/12/22 0330 06/13/22 0312  Weight: 81.2 kg 80 kg 78.4 kg    Examination:  General exam: Pleasant elderly male sitting up in bed, AAOx3, no distress HEENT: No JVD CVS: S1-S2, regular rhythm Lungs: Clear  bilaterally Abdomen: Soft, nontender, bowel sounds present  Extremities: no edema, chronic wounds on both legs with dressing Skin: As above Psychiatry:  Mood & affect appropriate.     Data Reviewed:   CBC: Recent Labs  Lab 06/10/22 1547 06/13/22 0022  WBC 3.9* 4.4  NEUTROABS 3.2  --   HGB 12.1* 11.8*  HCT 39.2 37.5*  MCV 85.4 83.1  PLT 231 607   Basic Metabolic Panel: Recent Labs  Lab 06/10/22 1547 06/11/22 1656 06/12/22 0101 06/13/22 0022  NA 142 141 142 138  K 4.1 3.8 3.5 3.5  CL 108 106 103 100  CO2 '27 28 30 30  '$ GLUCOSE 121* 104* 105* 130*  BUN '14 14 15 18  '$ CREATININE 0.96 0.98 1.07 1.05  CALCIUM 9.2 9.0 8.9 8.8*   GFR: Estimated Creatinine Clearance: 52.4 mL/min (by C-G formula based on SCr of 1.05 mg/dL). Liver Function Tests: No results for input(s): "AST", "ALT", "ALKPHOS", "BILITOT", "PROT", "ALBUMIN" in the last 168 hours. No results for input(s): "LIPASE", "AMYLASE" in the last 168 hours. No results for input(s): "AMMONIA" in the last 168 hours. Coagulation Profile: Recent Labs  Lab 06/10/22 1547  INR 1.3*   Cardiac Enzymes: No results for input(s): "CKTOTAL", "CKMB", "CKMBINDEX", "TROPONINI" in the last 168 hours. BNP (last 3 results) No results for input(s): "PROBNP" in the last 8760 hours. HbA1C: No results for input(s): "HGBA1C" in the last 72 hours. CBG: No results for input(s): "GLUCAP" in the last 168 hours. Lipid Profile: No results for input(s): "CHOL", "HDL", "LDLCALC", "TRIG", "CHOLHDL", "LDLDIRECT" in the last 72 hours. Thyroid Function Tests: No results for input(s): "TSH", "T4TOTAL", "FREET4", "T3FREE", "THYROIDAB" in the last 72 hours. Anemia Panel: No results for input(s): "VITAMINB12", "FOLATE", "FERRITIN", "TIBC", "IRON", "RETICCTPCT" in the last 72 hours. Urine analysis:    Component Value Date/Time   COLORURINE STRAW (A) 03/05/2017 1120   APPEARANCEUR CLEAR 03/05/2017 1120   LABSPEC 1.005 03/05/2017 1120   PHURINE 6.0  03/05/2017 1120   GLUCOSEU NEGATIVE 03/05/2017 1120   HGBUR NEGATIVE 03/05/2017 1120   BILIRUBINUR NEGATIVE 03/05/2017 1120   KETONESUR NEGATIVE 03/05/2017 1120   PROTEINUR NEGATIVE 03/05/2017 1120   UROBILINOGEN 0.2 03/05/2015 1115   NITRITE NEGATIVE 03/05/2017 1120   LEUKOCYTESUR NEGATIVE 03/05/2017 1120   Sepsis Labs: '@LABRCNTIP'$ (procalcitonin:4,lacticidven:4)  )No results found for this or any previous visit (from the past 240 hour(s)).   Radiology Studies: No results found.   Scheduled Meds:  apixaban  5 mg Oral BID   atorvastatin  80 mg Oral Daily   brimonidine  1 drop Both Eyes TID   carvedilol  6.25 mg Oral BID WC   digoxin  0.125 mg Oral Daily   empagliflozin  25 mg Oral Daily   famotidine  20 mg Oral BID   [START ON 06/14/2022] furosemide  40 mg Oral Daily   influenza vaccine adjuvanted  0.5 mL Intramuscular Tomorrow-1000   isosorbide mononitrate  30 mg Oral Daily   ketorolac  1 drop Left Eye Daily   latanoprost  1 drop Both Eyes QHS   loratadine  10  mg Oral Daily   ofloxacin  1 drop Left Eye QID   potassium chloride  40 mEq Oral BID   sacubitril-valsartan  1 tablet Oral BID   sodium chloride flush  3 mL Intravenous Q12H   spironolactone  12.5 mg Oral Daily   Continuous Infusions:   LOS: 2 days    Time spent: 33mn    PDomenic Polite MD [facility]   06/13/2022, 10:57 AM

## 2022-06-13 NOTE — Progress Notes (Signed)
Heart Failure Navigator Progress Note  Assessed for Heart & Vascular TOC clinic readiness.  Prior to hospitalization care established with AHF clinic and Dr. Aundra Dubin. Last seen 03/29/22.   HF Navigation will sign-off.  Pricilla Holm, MSN, RN Heart Failure Nurse Navigator

## 2022-06-13 NOTE — Consult Note (Signed)
WOC Nurse Consult Note: Reason for Consult:Nonhealing venous wounds to bilateral anterior legs.  Also, left posterior lower leg.  Wound type: venous, abnormal ABIs but has been tolerating compression for a long time now.   Pressure Injury POA: NA Measurement: Left anterior lower leg 2 open lesions: 1 cm x 2 cm x 0.1 cm and 3 cm x 2.8 cm x 0.1 cm  left posterior leg 1.5 cm x 1 cm x 0.2 cm  Right anterior lower leg:  3 cm x 3 cm x 0.1 cm  1 cmx 3 cm x 0.1 cm  Wound bed: pink, fully granulated Drainage (amount, consistency, odor) minimal serosanguinous to left posterior leg, none from others Seen at wound care center Periwound: chronic skin changes, dry, lichenification Dressing procedure/placement/frequency:  Cleanse bilateral lower legs with soap and water and pat dry. Apply Aquaphor.  Apply  Aquacel Ag to open wounds ([facility] # [id number]).  Wrap bilateral lower legs with kerlix and secure with self adherent coban . Change weekly. Will not follow at this time.  Please re-consult if needed.  Domenic Moras MSN, RN, FNP-BC CWON Wound, Ostomy, Continence Nurse Pager [phone]

## 2022-06-13 NOTE — Progress Notes (Signed)
   06/13/22 0816  Assess: MEWS Score  Temp 98.3 F (36.8 C)  BP (!) 129/90  MAP (mmHg) 98  Pulse Rate (!) 114  Resp 18  Level of Consciousness Alert  SpO2 95 %  O2 Device Room Air  Assess: MEWS Score  MEWS Temp 0  MEWS Systolic 0  MEWS Pulse 2  MEWS RR 0  MEWS LOC 0  MEWS Score 2  MEWS Score Color Yellow  Assess: if the MEWS score is Yellow or Red  Were vital signs taken at a resting state? Yes  Focused Assessment No change from prior assessment  Does the patient meet 2 or more of the SIRS criteria? No  MEWS guidelines implemented *See Row Information* Yes  Treat  Pain Scale 0-10  Pain Score 0  Take Vital Signs  Increase Vital Sign Frequency  Yellow: Q 2hr X 2 then Q 4hr X 2, if remains yellow, continue Q 4hrs  Escalate  MEWS: Escalate Yellow: discuss with charge nurse/RN and consider discussing with provider and RRT  Notify: Charge Nurse/RN  Name of Charge Nurse/RN Notified [place]  Patient Outcome Stabilized after interventions  Progress note created (see row info) Yes  Assess: SIRS CRITERIA  SIRS Temperature  0  SIRS Pulse 1  SIRS Respirations  0  SIRS WBC 1  SIRS Score Sum  2

## 2022-06-13 NOTE — Progress Notes (Signed)
Rounding Note    Patient Name: Vincent [name] Date of Encounter: 06/13/2022  [place] Cardiologist: Dorris Carnes, MD   Subjective   Net -4.3 L yesterday, -7.5 L on admission.  Creatinine stable at 1.05.  BP 129/90.  Reports chronic chest pain x1 month.  Denies any dyspnea   Inpatient Medications    Scheduled Meds:  apixaban  5 mg Oral BID   atorvastatin  80 mg Oral Daily   brimonidine  1 drop Both Eyes TID   carvedilol  6.25 mg Oral BID WC   digoxin  0.125 mg Oral Daily   empagliflozin  25 mg Oral Daily   famotidine  20 mg Oral BID   furosemide  40 mg Intravenous BID   influenza vaccine adjuvanted  0.5 mL Intramuscular Tomorrow-1000   isosorbide mononitrate  30 mg Oral Daily   ketorolac  1 drop Left Eye Daily   latanoprost  1 drop Both Eyes QHS   loratadine  10 mg Oral Daily   ofloxacin  1 drop Left Eye QID   potassium chloride  40 mEq Oral BID   sacubitril-valsartan  1 tablet Oral BID   sodium chloride flush  3 mL Intravenous Q12H   Continuous Infusions:  PRN Meds: acetaminophen **OR** acetaminophen, albuterol, nitroGLYCERIN   Vital Signs    Vitals:   06/12/22 1800 06/12/22 1924 06/13/22 0312 06/13/22 0816  BP:  118/87 101/60 (!) 129/90  Pulse:  84 83 (!) 114  Resp:  '18 18 18  '$ Temp:  98.6 F (37 C) 98.2 F (36.8 C) 98.3 F (36.8 C)  TempSrc:  Oral Oral Oral  SpO2: 100% 99% 90% 95%  Weight:   78.4 kg   Height:       Orthostatic BP110/74  P 90   Laying   146/79 P 95 sitting   150/88 P 109 standing   Intake/Output Summary (Last 24 hours) at 06/13/2022 0900 Last data filed at 06/12/2022 2300 Gross per 24 hour  Intake 480 ml  Output 4800 ml  Net -4320 ml       06/13/2022    3:12 AM 06/12/2022    3:30 AM 06/11/2022    3:39 PM  Last 3 Weights  Weight (lbs) 172 lb 13.5 oz 176 lb 5.9 oz 179 lb 0.2 oz  Weight (kg) 78.4 kg 80 kg 81.2 kg      Telemetry    AF 80-100s  - Personally Reviewed  ECG    No new  - Personally  Reviewed  Physical Exam   GEN: No acute distress.   Neck: No JVD Cardiac: RRR, no murmurs, Respiratory: Clear to auscultation bilaterally. GI: Soft, nontender, non-distended  MS: No edema; No deformity. Neuro:  Nonfocal  Psych: Normal affect   Labs    High Sensitivity Troponin:   Recent Labs  Lab 06/10/22 1547 06/10/22 1857  TROPONINIHS 20* 20*      Chemistry Recent Labs  Lab 06/11/22 1656 06/12/22 0101 06/13/22 0022  NA 141 142 138  K 3.8 3.5 3.5  CL 106 103 100  CO2 '28 30 30  '$ GLUCOSE 104* 105* 130*  BUN '14 15 18  '$ CREATININE 0.98 1.07 1.05  CALCIUM 9.0 8.9 8.8*  GFRNONAA >60 >60 >60  ANIONGAP '7 9 8     '$ Lipids No results for input(s): "CHOL", "TRIG", "HDL", "LABVLDL", "LDLCALC", "CHOLHDL" in the last 168 hours.  Hematology Recent Labs  Lab 06/10/22 1547 06/13/22 0022  WBC 3.9* 4.4  RBC 4.59 4.51  HGB 12.1* 11.8*  HCT 39.2 37.5*  MCV 85.4 83.1  MCH 26.4 26.2  MCHC 30.9 31.5  RDW 18.2* 17.9*  PLT 231 219    Thyroid No results for input(s): "TSH", "FREET4" in the last 168 hours.  BNP Recent Labs  Lab 06/11/22 0504  BNP 2,812.9*     DDimer No results for input(s): "DDIMER" in the last 168 hours.   Radiology    No results found.  Cardiac Studies   Echo   03/29/22  Left ventricular ejection fraction, by estimation, is 25 to 30%. The left ventricle has severely decreased function. The left ventricle demonstrates global hypokinesis. The left ventricular internal cavity size was moderately to severely dilated. Left ventricular diastolic function could not be evaluated. 1. Right ventricular systolic function is mildly reduced. The right ventricular size is mildly enlarged. There is mildly elevated pulmonary artery systolic pressure. The estimated right ventricular systolic pressure is 10.6 mmHg. 2. 3. Left atrial size was moderately dilated. 4. Right atrial size was moderately dilated. 5. The mitral valve is normal in structure. Mild to  moderate mitral valve regurgitation. The aortic valve is tricuspid. Aortic valve regurgitation is not visualized. No aortic stenosis is present. 6. The inferior vena cava is normal in size with greater than 50% respiratory variability, suggesting right atrial pressure of 3 mmHg. 7. Comparison(s): There may be a slight improvement in left ventricular contractility compared to 09/18/2021.  Patient Profile     86 y.o. male with history of chronic systolic CHF, EF 26-94%, permanent atrial fibrillation on anticoagulation, CAD, type 2 diabetes mellitus, CVA, left foot drop, and chronic wounds presented with complaints of dizziness  Assessment & Plan    Dizziness: Had episode of dizziness on 1014 causing him to fall but no loss of consciousness.  Orthostatics show no drop in blood pressure with standing.  Recent echo 03/29/2022 showed EF 25 to 30%, mild RV dysfunction, mild to moderate MR -Plan monitor on discharge  Acute on chronic combined heart failure: SPO2 80% on room air on presentation, BNP 2912, chest x-ray consistent with heart failure.  Echo 03/29/2022 with EF 25 to 30% -Appears euvolemic.  Received IV Lasix this morning, will transition to p.o. Lasix 40 mg daily tomorrow -Continue Entresto, [place], Coreg, digoxin.  Add Aldactone 12.5 mg daily  Permanent atrial fibrillation: On Eliquis, Coreg, digoxin. Digoxin level low  Hypertension: On Coreg, Entresto, Imdur  CAD: History of PCI to RCA.  Cath 08/2021 showed patent RCA stent, 70% distal LCx stenosis.  Continue Eliquis, Tora statin  CVA: Continue Eliquis, statin    For questions or updates, please contact [place] Please consult [email or web address] for contact info under        Signed, Donato Heinz, MD  06/13/2022, 9:00 AM

## 2022-06-14 ENCOUNTER — Inpatient Hospital Stay: Payer: Medicare Other

## 2022-06-14 ENCOUNTER — Other Ambulatory Visit: Payer: Self-pay | Admitting: Physician Assistant

## 2022-06-14 ENCOUNTER — Encounter: Payer: Self-pay | Admitting: *Deleted

## 2022-06-14 ENCOUNTER — Other Ambulatory Visit (HOSPITAL_COMMUNITY): Payer: Self-pay

## 2022-06-14 ENCOUNTER — Encounter (HOSPITAL_COMMUNITY): Payer: Medicare Other

## 2022-06-14 DIAGNOSIS — I4729 Other ventricular tachycardia: Secondary | ICD-10-CM

## 2022-06-14 DIAGNOSIS — R55 Syncope and collapse: Secondary | ICD-10-CM

## 2022-06-14 DIAGNOSIS — I4821 Permanent atrial fibrillation: Secondary | ICD-10-CM | POA: Diagnosis not present

## 2022-06-14 DIAGNOSIS — R42 Dizziness and giddiness: Secondary | ICD-10-CM | POA: Diagnosis not present

## 2022-06-14 DIAGNOSIS — I4819 Other persistent atrial fibrillation: Secondary | ICD-10-CM

## 2022-06-14 DIAGNOSIS — I5043 Acute on chronic combined systolic (congestive) and diastolic (congestive) heart failure: Secondary | ICD-10-CM | POA: Diagnosis not present

## 2022-06-14 LAB — BASIC METABOLIC PANEL
Anion gap: 8 (ref 5–15)
BUN: 19 mg/dL (ref 8–23)
CO2: 29 mmol/L (ref 22–32)
Calcium: 8.5 mg/dL — ABNORMAL LOW (ref 8.9–10.3)
Chloride: 98 mmol/L (ref 98–111)
Creatinine, Ser: 1.18 mg/dL (ref 0.61–1.24)
GFR, Estimated: >60 mL/min (ref >60)
Glucose, Bld: 120 mg/dL — ABNORMAL HIGH (ref 70–99)
Potassium: 3.5 mmol/L (ref 3.5–5.1)
Sodium: 135 mmol/L (ref 135–145)

## 2022-06-14 LAB — MAGNESIUM: Magnesium: 1.8 mg/dL (ref 1.7–2.4)

## 2022-06-14 MED ORDER — POTASSIUM CHLORIDE CRYS ER 20 MEQ PO TBCR
40.0000 meq | EXTENDED_RELEASE_TABLET | Freq: Once | ORAL | Status: AC
  Administered 2022-06-14: 40 meq via ORAL
  Filled 2022-06-14: qty 2

## 2022-06-14 MED ORDER — FUROSEMIDE 40 MG PO TABS
40.0000 mg | ORAL_TABLET | Freq: Every day | ORAL | 0 refills | Status: DC
  Filled 2022-06-14: qty 30, 30d supply, fill #0

## 2022-06-14 MED ORDER — SPIRONOLACTONE 25 MG PO TABS
12.5000 mg | ORAL_TABLET | Freq: Every day | ORAL | 0 refills | Status: DC
  Filled 2022-06-14: qty 30, 60d supply, fill #0

## 2022-06-14 NOTE — Progress Notes (Signed)
Patient had 5beats of vtach, provider notified.

## 2022-06-14 NOTE — Plan of Care (Signed)
  Problem: Education: Goal: Ability to demonstrate management of disease process will improve Outcome: Progressing   Problem: Education: Goal: Knowledge of General Education information will improve Description: Including pain rating scale, medication(s)/side effects and non-pharmacologic comfort measures Outcome: Progressing   Problem: Health Behavior/Discharge Planning: Goal: Ability to manage health-related needs will improve Outcome: Progressing   

## 2022-06-14 NOTE — Progress Notes (Signed)
Discharge meds. Lasix and spironolactone,  from [facility] given to patient sister at bedside.

## 2022-06-14 NOTE — TOC Progression Note (Signed)
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  Conts on po lasix, he has a chronic wound, he follows up with a wound clinic, he is refusing SNF per previous CSW note and HH, and would prefer outpatient therapy. Mobility specialist is working with patient today, he is still having some dizziness. TOC following.    Expected Discharge Plan: OP Rehab Barriers to Discharge: Continued Medical Work up  Expected Discharge Plan and Services Expected Discharge Plan: OP Rehab       Living arrangements for the past 2 months: Single Family Home                                       Social Determinants of Health (SDOH) Interventions    Readmission Risk Interventions     No data to display

## 2022-06-14 NOTE — Progress Notes (Signed)
Patient had 6 beats of vtach, asymptomatic. Will continue to monitor. Morning labs have resulted.

## 2022-06-14 NOTE — Discharge Summary (Signed)
Physician Discharge Summary  Vincent [name] PFX:[id number] DOB: 1936-08-27 DOA: 06/10/2022  PCP: [name], MD  Admit date: 06/10/2022 Discharge date: 06/14/2022  Time spent: 45 minutes  Recommendations for Outpatient Follow-up:  PCP Dr. Deforest Hoyles in 1 to 2 weeks Cardiology Dr. Aundra Dubin in 1 month   Discharge Diagnoses:  Principal Problem:   Acute on chronic combined systolic and diastolic CHF (congestive heart failure) ([place]) Active Problems:   Acute respiratory failure with hypoxia (HCC)   Elevated troponin   Dizziness   Fall   Persistent atrial fibrillation [facility])   Essential hypertension   Coronary atherosclerosis   Controlled type 2 diabetes mellitus without complication, without long-term current use of insulin (HCC)   HLD (hyperlipidemia)   Chronic leg wounds   Permanent atrial fibrillation [facility])   Discharge Condition: Improved  Diet recommendation: Diabetic, heart healthy  Filed Weights   06/12/22 0330 06/13/22 0312 06/14/22 0336  Weight: 80 kg 78.4 kg 77.2 kg    History of present illness:  Vincent [name] is a 86/M with history of chronic systolic CHF, EF 32-99%, permanent atrial fibrillation on anticoagulation, CAD, type 2 diabetes mellitus, CVA, left foot drop, and chronic wounds presented with complaints of dizziness. 10/14, while he was try to get lunch he reported feeling dizzy that he describes as feeling funny in his head with sinus congestion.  He lost his footing and reported falling onto a small refrigerator with the microwave on top knocking it over and hitting a chair.  Denied any loss of consciousness or trauma.  Patient was unable to get up despite trying for some time prior being able to call for assistance.  He is followed by wound care for chronic wounds of his lower extremities.   In The ED,  afebrile with heart rate 61-1 49, respirations 16-27, blood pressures maintained, and O2 saturations noted to be as low as 80% with improvement on 2 L of nasal  cannula oxygen.  Labs -WBC 3.9, hemoglobin 12.1, digoxin level <0.2, high-sensitivity troponin 20-> 20, and BNP 2812.9.  CT scan of the head did not note any acute abnormality with a large old infarct of right MCA.  Chest x-ray noted CHF pattern.    Hospital Course:   Dizziness Fall  Patient reported having episode of dizziness 10/14 where he felt off balance causing him to fall.  no LoC -Orthostatics negative, telemetry with A-fib-rate controlled -Cardiology recommended Zio patch for 2 weeks at discharge -Cards following, PT eval-quite unsteady, declines rehab -Ambulate with PT today   Acute on chronic systolic CHF -Hypoxic at the time of evaluation in the ED, 80% on room air, now on few liters, BNP 2912, chest x-ray with CHF pattern -Last echocardiogram noted EF to be 25-30% with global hypokinesis of the left ventricle 03/2022.   -Improving, diuresed with IV Lasix he is 8 L negative, transition to oral Lasix -Continue Entresto, Jardiance, Coreg, digoxin, starting on low-dose Aldactone -Improved, follow-up with Dr. Aundra Dubin in 1 month   Persistent atrial fibrillation on chronic anticoagulation -Rate controlled -Continue Coreg, digoxin and Eliquis   Essential hypertension -Continue Coreg, Entresto, and isosorbide mononitrate -Orthostatics negative, BP stable   CAD Prior history of PCI to RCA remotely.  Last heart cath 1/23 noted patent RCA stent with 70% stenosis distal left circumflex. -Continue isosorbide mononitrate and atorvastatin   Controlled diabetes mellitus type 2, without long-term use of insulin Last hemoglobin A1c 7 on 09/17/2021.   -Continue Jardiance, metformin   Hyperlipidemia -Continue atorvastatin  History of stroke Patient with prior history of right MCA with left foot drop. -Continue Eliquis   Chronic lower extremity ulcers -Continue outpatient follow-up with wound clinic -WOC consult, dressing change, recommended to continue weekly follow-up  Discharge  Exam: Vitals:   06/14/22 0336 06/14/22 0852  BP: (!) 129/90 (!) 101/54  Pulse: 85 90  Resp: 18 18  Temp: 98.4 F (36.9 C) 98.7 F (37.1 C)  SpO2: 91% 91%    General exam: Pleasant elderly male sitting up in bed, AAOx3, no distress HEENT: No JVD CVS: S1-S2, regular rhythm Lungs: Clear bilaterally Abdomen: Soft, nontender, bowel sounds present  Extremities: no edema, chronic wounds on both legs with dressing Skin: As above Psychiatry:  Mood & affect appropriate.   Discharge Instructions    Allergies as of 06/14/2022   No Known Allergies      Medication List     TAKE these medications    acetaminophen 500 MG tablet Commonly known as: TYLENOL Take 2 tablets (1,000 mg total) by mouth every 6 (six) hours as needed. What changed: reasons to take this   ALPHAGAN P OP Place 1 drop into both eyes in the morning, at noon, and at bedtime.   apixaban 5 MG Tabs tablet Commonly known as: ELIQUIS Take 1 tablet (5 mg total) by mouth 2 (two) times daily.   atorvastatin 80 MG tablet Commonly known as: LIPITOR Take 1 tablet (80 mg total) by mouth daily.   carvedilol 6.25 MG tablet Commonly known as: COREG Take 1 tablet (6.25 mg total) by mouth 2 (two) times daily with a meal.   digoxin 0.125 MG tablet Commonly known as: LANOXIN Take 1 tablet (0.125 mg total) by mouth daily.   famotidine 20 MG tablet Commonly known as: PEPCID Take 20 mg by mouth 2 (two) times daily.   furosemide 40 MG tablet Commonly known as: LASIX Take 1 tablet (40 mg total) by mouth daily. What changed:  medication strength how much to take when to take this   IRON PO Take 1 tablet by mouth daily.   isosorbide mononitrate 30 MG 24 hr tablet Commonly known as: IMDUR Take 1 tablet (30 mg total) by mouth daily.   Jardiance 25 MG Tabs tablet Generic drug: empagliflozin Take 25 mg by mouth daily.   loratadine 10 MG tablet Commonly known as: CLARITIN Take 10 mg by mouth daily.   Lumigan  0.01 % Soln Generic drug: bimatoprost Place 1 drop into both eyes at bedtime.   Magnesium 250 MG Tabs Take 1 tablet by mouth 2 (two) times daily.   metFORMIN 500 MG tablet Commonly known as: GLUCOPHAGE Take 500 mg by mouth 2 (two) times daily.   nitroGLYCERIN 0.4 MG/SPRAY spray Commonly known as: NITROLINGUAL Place 1 spray under the tongue every 5 (five) minutes x 3 doses as needed for chest pain. Please call and schedule an appointment   Ocuflox 0.3 % ophthalmic solution Generic drug: ofloxacin Place 1 drop into the left eye 4 (four) times daily.   OMEGA-3 FISH OIL PO Take 1 capsule by mouth daily.   OSCAL 500 D-3 PO Take 1 tablet by mouth 2 (two) times daily.   Prolensa 0.07 % Soln Generic drug: Bromfenac Sodium Place 1 drop into the left eye daily.   sacubitril-valsartan 97-103 MG Commonly known as: ENTRESTO Take 1 tablet by mouth 2 (two) times daily.   spironolactone 25 MG tablet Commonly known as: ALDACTONE Take 0.5 tablets (12.5 mg total) by mouth daily. Start taking on:  June 15, 2022   VITAMIN B-12 CR PO Take 1 tablet by mouth daily.       No Known Allergies  Follow-up Information     [facility] Follow up on 06/29/2022.   Specialty: Cardiology Why: '@11'$ :30am for hospital follow up Contact information: [address] [id number] [place] [phone]                 The results of significant diagnostics from this hospitalization (including imaging, microbiology, ancillary and laboratory) are listed below for reference.    Significant Diagnostic Studies: DG Chest Portable 1 View  Result Date: 06/11/2022 CLINICAL DATA:  Hypoxia EXAM: PORTABLE CHEST 1 VIEW COMPARISON:  09/17/2021 FINDINGS: Chronic cardiomegaly. Prominence of hilar vessels with diffuse interstitial opacity and cephalized blood flow. No visible effusion or pneumothorax. Cardiac device over the left  chest. IMPRESSION: CHF pattern. Electronically Signed   By: Jorje Guild M.D.   On: 06/11/2022 07:59   CT Head Wo Contrast  Result Date: 06/10/2022 CLINICAL DATA:  TIA, vertigo EXAM: CT HEAD WITHOUT CONTRAST TECHNIQUE: Contiguous axial images were obtained from the base of the skull through the vertex without intravenous contrast. RADIATION DOSE REDUCTION: This exam was performed according to the departmental dose-optimization program which includes automated exposure control, adjustment of the mA and/or kV according to patient size and/or use of iterative reconstruction technique. COMPARISON:  03/05/2015 FINDINGS: Brain: No acute intracranial findings are seen. There are no signs of bleeding within the cranium. There is large old infarct in right frontal, temporal and parietal lobes in right MCA distribution. Cortical sulci are prominent. There is no focal edema or mass effect. Vascular: Unremarkable. Skull: Unremarkable. Sinuses/Orbits: Unremarkable. Other: None. IMPRESSION: No acute intracranial findings are seen. There is large old infarct in right MCA distribution. Atrophy. No significant interval changes are noted. Electronically Signed   By: Elmer Picker M.D.   On: 06/10/2022 16:43    Microbiology: No results found for this or any previous visit (from the past 240 hour(s)).   Labs: Basic Metabolic Panel: Recent Labs  Lab 06/10/22 1547 06/11/22 1656 06/12/22 0101 06/13/22 0022 06/14/22 0048  NA 142 141 142 138 135  K 4.1 3.8 3.5 3.5 3.5  CL 108 106 103 100 98  CO2 '27 28 30 30 29  '$ GLUCOSE 121* 104* 105* 130* 120*  BUN '14 14 15 18 19  '$ CREATININE 0.96 0.98 1.07 1.05 1.18  CALCIUM 9.2 9.0 8.9 8.8* 8.5*  MG  --   --   --   --  1.8   Liver Function Tests: No results for input(s): "AST", "ALT", "ALKPHOS", "BILITOT", "PROT", "ALBUMIN" in the last 168 hours. No results for input(s): "LIPASE", "AMYLASE" in the last 168 hours. No results for input(s): "AMMONIA" in the last 168  hours. CBC: Recent Labs  Lab 06/10/22 1547 06/13/22 0022  WBC 3.9* 4.4  NEUTROABS 3.2  --   HGB 12.1* 11.8*  HCT 39.2 37.5*  MCV 85.4 83.1  PLT 231 219   Cardiac Enzymes: No results for input(s): "CKTOTAL", "CKMB", "CKMBINDEX", "TROPONINI" in the last 168 hours. BNP: BNP (last 3 results) Recent Labs    09/17/21 1317 03/29/22 1552 06/11/22 0504  BNP 1,017.0* 848.4* 2,812.9*    ProBNP (last 3 results) No results for input(s): "PROBNP" in the last 8760 hours.  CBG: No results for input(s): "GLUCAP" in the last 168 hours.     Signed:  Domenic Polite MD.  [facility] 06/14/2022, 11:09 AM

## 2022-06-14 NOTE — Progress Notes (Signed)
   06/14/22 1156  Mobility  Activity Ambulated with assistance in hallway  Level of Assistance Contact guard assist, steadying assist  Assistive Device Front wheel walker  Distance Ambulated (ft) 230 ft  Activity Response Tolerated well  Mobility Referral Yes  $Mobility charge 1 Mobility   Mobility Specialist Progress Note  Pre-Mobility:  94% SpO2 (RA) During Mobility: 92% SpO2 (RA) Post-Mobility:  95% SpO2 (RA)  Received pt in bed having no complaints and agreeable to mobility. Pt was asymptomatic throughout ambulation and returned to room w/o fault. Left in bed w/ call bell in reach and all needs met.   Lucious Groves Mobility Specialist

## 2022-06-14 NOTE — Progress Notes (Signed)
Physical Therapy Treatment Patient Details Name: Vincent [name] MRN: [id number] DOB: 1935/10/21 Today's Date: 06/14/2022   History of Present Illness 86 yo male with onset of vertigo and sinus congestion was admitted with a fall but not having hit his head.  Pt is admitted 10/13, had chest pain last PM.  Noted CHF with new O2 use of 2L per nasal cannula.  PMHx:  heart cath, loop recorder, NSVT, syncope, glaucoma, L side weakness from stroke, EtOH cardiomyopathy, bradycardia, COPD, L foot drop, HTN, GERD, PN, a-fib    PT Comments    Pt received supine and agreeable to session with continued progress towards acute goals. Pt able to complete bed mobility with light min assist and transfer sit<>stand with up to mod assist at start down to min guard with task practice with pt able to complete 5xSTS at end of session. Pt requiring min guard for gait with RW with noted inattention to L with pt needing VC's throughout to look to left and for safety awareness. Pt without over LOB however instability noted during turns with difficulty clearing L foot. Encourage RW use post acutely with pt and pt sister in agreement. Pt continues to benefit from skilled PT services to progress toward functional mobility goals.    Recommendations for follow up therapy are one component of a multi-disciplinary discharge planning process, led by the attending physician.  Recommendations may be updated based on patient status, additional functional criteria and insurance authorization.  Follow Up Recommendations  Skilled nursing-short term rehab (<3 hours/day) Can patient physically be transported by private vehicle: No   Assistance Recommended at Discharge Frequent or constant Supervision/Assistance  Patient can return home with the following A lot of help with walking and/or transfers;A lot of help with bathing/dressing/bathroom;Assistance with cooking/housework;Direct supervision/assist for medications management;Direct  supervision/assist for financial management;Assist for transportation;Help with stairs or ramp for entrance   Equipment Recommendations  None recommended by PT    Recommendations for Other Services       Precautions / Restrictions Precautions Precautions: Fall Precaution Comments: keep O2 sats at 92% or greater Restrictions Weight Bearing Restrictions: No     Mobility  Bed Mobility Overal bed mobility: Needs Assistance Bed Mobility: Supine to Sit, Sit to Supine     Supine to sit: Min guard Sit to supine: Min guard   General bed mobility comments: min guard for safety    Transfers Overall transfer level: Needs assistance Equipment used: Rolling walker (2 wheels) Transfers: Sit to/from Stand Sit to Stand: Min assist, Min guard           General transfer comment: min assist to min guard with task practice, heavy cues at start for hand placement with fair carry through    Ambulation/Gait Ambulation/Gait assistance: Min assist Gait Distance (Feet): 150 Feet Assistive device: Rolling walker (2 wheels) Gait Pattern/deviations: Step-through pattern, Wide base of support, Decreased stride length Gait velocity: reduced     General Gait Details: no overt LOB, instability with turns with cues for proximity to RW, encourage RW post acutely with pt and sister in agreement   Stairs             Wheelchair Mobility    Modified [name] (Stroke Patients Only)       Balance Overall balance assessment: Needs assistance Sitting-balance support: Feet supported Sitting balance-Leahy Scale: Fair     Standing balance support: Bilateral upper extremity supported Standing balance-Leahy Scale: Poor Standing balance comment: reliance on RW for support  Cognition Arousal/Alertness: Awake/alert Behavior During Therapy: WFL for tasks assessed/performed Overall Cognitive Status: Within Functional Limits for tasks assessed                                           Exercises      General Comments General comments (skin integrity, edema, etc.): VSS on RA when good pleth avaliable      Pertinent Vitals/Pain Pain Assessment Faces Pain Scale: No hurt    Home Living                          Prior Function            PT Goals (current goals can now be found in the care plan section) Acute Rehab PT Goals Patient Stated Goal: to get stronger PT Goal Formulation: With patient Time For Goal Achievement: 06/25/22    Frequency    Min 2X/week      PT Plan      Co-evaluation              AM-PAC PT "6 Clicks" Mobility   Outcome Measure  Help needed turning from your back to your side while in a flat bed without using bedrails?: A Little Help needed moving from lying on your back to sitting on the side of a flat bed without using bedrails?: A Lot Help needed moving to and from a bed to a chair (including a wheelchair)?: A Lot Help needed standing up from a chair using your arms (e.g., wheelchair or bedside chair)?: A Little Help needed to walk in hospital room?: A Little Help needed climbing 3-5 steps with a railing? : Total 6 Click Score: 14    End of Session Equipment Utilized During Treatment: Gait belt Activity Tolerance: Patient tolerated treatment well Patient left: in bed;with call bell/phone within reach;with bed alarm set;with family/visitor present Nurse Communication: Mobility status PT Visit Diagnosis: Unsteadiness on feet (R26.81);Muscle weakness (generalized) (M62.81);Difficulty in walking, not elsewhere classified (R26.2);Hemiplegia and hemiparesis Hemiplegia - Right/Left: Left Hemiplegia - dominant/non-dominant: Non-dominant Hemiplegia - caused by: Unspecified     Time: 6295-2841 PT Time Calculation (min) (ACUTE ONLY): 23 min  Charges:  $Gait Training: 8-22 mins $Therapeutic Activity: 8-22 mins                     Jerren Flinchbaugh R. PTA Acute  Rehabilitation Services Office: [place] 06/14/2022, 4:21 PM

## 2022-06-14 NOTE — Progress Notes (Signed)
Rounding Note    Patient Name: Vincent [name] Date of Encounter: 06/14/2022  [place] Cardiologist: Dorris Carnes, MD   Subjective   Net -500cc yesterday, -8 L on admission.  Creatinine 1.18.  BP 101/54.  Reports chronic chest pain x1 month.  Denies any dyspnea   Inpatient Medications    Scheduled Meds:  apixaban  5 mg Oral BID   atorvastatin  80 mg Oral Daily   brimonidine  1 drop Both Eyes TID   carvedilol  6.25 mg Oral BID WC   digoxin  0.125 mg Oral Daily   empagliflozin  25 mg Oral Daily   famotidine  20 mg Oral BID   furosemide  40 mg Oral Daily   isosorbide mononitrate  30 mg Oral Daily   ketorolac  1 drop Left Eye Daily   latanoprost  1 drop Both Eyes QHS   loratadine  10 mg Oral Daily   ofloxacin  1 drop Left Eye QID   sacubitril-valsartan  1 tablet Oral BID   sodium chloride flush  3 mL Intravenous Q12H   spironolactone  12.5 mg Oral Daily   Continuous Infusions:  PRN Meds: acetaminophen **OR** acetaminophen, albuterol, mineral oil-hydrophilic petrolatum, nitroGLYCERIN   Vital Signs    Vitals:   06/13/22 1920 06/13/22 2128 06/14/22 0336 06/14/22 0852  BP: (!) 123/100 101/78 (!) 129/90 (!) 101/54  Pulse: 100  85 90  Resp: '18  18 18  '$ Temp: 98.4 F (36.9 C)  98.4 F (36.9 C) 98.7 F (37.1 C)  TempSrc: Oral  Oral Oral  SpO2: 91%  91% 91%  Weight:   77.2 kg   Height:       Orthostatic BP110/74  P 90   Laying   146/79 P 95 sitting   150/88 P 109 standing   Intake/Output Summary (Last 24 hours) at 06/14/2022 0936 Last data filed at 06/14/2022 0900 Gross per 24 hour  Intake 723 ml  Output 1250 ml  Net -527 ml       06/14/2022    3:36 AM 06/13/2022    3:12 AM 06/12/2022    3:30 AM  Last 3 Weights  Weight (lbs) 170 lb 3.1 oz 172 lb 13.5 oz 176 lb 5.9 oz  Weight (kg) 77.2 kg 78.4 kg 80 kg      Telemetry    AF 90-100s  - Personally Reviewed  ECG    No new  - Personally Reviewed  Physical Exam   GEN: No acute distress.    Neck: No JVD Cardiac: RRR, no murmurs, Respiratory: Clear to auscultation bilaterally. GI: Soft, nontender, non-distended  MS: No edema; No deformity. Neuro:  Nonfocal  Psych: Normal affect   Labs    High Sensitivity Troponin:   Recent Labs  Lab 06/10/22 1547 06/10/22 1857  TROPONINIHS 20* 20*      Chemistry Recent Labs  Lab 06/12/22 0101 06/13/22 0022 06/14/22 0048  NA 142 138 135  K 3.5 3.5 3.5  CL 103 100 98  CO2 '30 30 29  '$ GLUCOSE 105* 130* 120*  BUN '15 18 19  '$ CREATININE 1.07 1.05 1.18  CALCIUM 8.9 8.8* 8.5*  MG  --   --  1.8  GFRNONAA >60 >60 >60  ANIONGAP '9 8 8     '$ Lipids No results for input(s): "CHOL", "TRIG", "HDL", "LABVLDL", "LDLCALC", "CHOLHDL" in the last 168 hours.  Hematology Recent Labs  Lab 06/10/22 1547 06/13/22 0022  WBC 3.9* 4.4  RBC 4.59 4.51  HGB 12.1* 11.8*  HCT 39.2 37.5*  MCV 85.4 83.1  MCH 26.4 26.2  MCHC 30.9 31.5  RDW 18.2* 17.9*  PLT 231 219    Thyroid No results for input(s): "TSH", "FREET4" in the last 168 hours.  BNP Recent Labs  Lab 06/11/22 0504  BNP 2,812.9*     DDimer No results for input(s): "DDIMER" in the last 168 hours.   Radiology    No results found.  Cardiac Studies   Echo   03/29/22  Left ventricular ejection fraction, by estimation, is 25 to 30%. The left ventricle has severely decreased function. The left ventricle demonstrates global hypokinesis. The left ventricular internal cavity size was moderately to severely dilated. Left ventricular diastolic function could not be evaluated. 1. Right ventricular systolic function is mildly reduced. The right ventricular size is mildly enlarged. There is mildly elevated pulmonary artery systolic pressure. The estimated right ventricular systolic pressure is 03.5 mmHg. 2. 3. Left atrial size was moderately dilated. 4. Right atrial size was moderately dilated. 5. The mitral valve is normal in structure. Mild to moderate mitral valve regurgitation. The  aortic valve is tricuspid. Aortic valve regurgitation is not visualized. No aortic stenosis is present. 6. The inferior vena cava is normal in size with greater than 50% respiratory variability, suggesting right atrial pressure of 3 mmHg. 7. Comparison(s): There may be a slight improvement in left ventricular contractility compared to 09/18/2021.  Patient Profile     86 y.o. male with history of chronic systolic CHF, EF 46-56%, permanent atrial fibrillation on anticoagulation, CAD, type 2 diabetes mellitus, CVA, left foot drop, and chronic wounds presented with complaints of dizziness  Assessment & Plan    Dizziness: Had episode of dizziness on 10/14 causing him to fall but no loss of consciousness.  Orthostatics show no drop in blood pressure with standing.  Recent echo 03/29/2022 showed EF 25 to 30%, mild RV dysfunction, mild to moderate MR -Has had short runs of NSVT.  Plan Zio x 2 weeks on discharge  Acute on chronic combined heart failure: SPO2 80% on room air on presentation, BNP 2912, chest x-ray consistent with heart failure.  Echo 03/29/2022 with EF 25 to 30% -Appears euvolemic.  Transition to p.o. Lasix 40 mg daily -Continue Entresto, Jardiance, Coreg, digoxin, Aldactone   Permanent atrial fibrillation: On Eliquis, Coreg, digoxin. Digoxin level low  Hypertension: On Coreg, Entresto, Imdur  CAD: History of PCI to RCA.  Cath 08/2021 showed patent RCA stent, 70% distal LCx stenosis.  Continue Eliquis, Tora statin  CVA: Continue Eliquis, statin  [facility] will sign off.   Medication Recommendations: Eliquis 5 mg twice daily, carvedilol 6.25 mg twice daily, digoxin 0.125 mg daily, Jardiance, Lasix 40 mg daily, Entresto 97-103 mg twice daily, spironolactone 12.5 mg daily Other recommendations (labs, testing, etc): BMET in 1 week.  Zio x 2 weeks on discharge Follow up as an outpatient: Will schedule    For questions or updates, please contact [place] Please consult [email or web address] for contact info under        Signed, Donato Heinz, MD  06/14/2022, 9:36 AM

## 2022-06-15 DIAGNOSIS — I5043 Acute on chronic combined systolic (congestive) and diastolic (congestive) heart failure: Secondary | ICD-10-CM

## 2022-06-15 NOTE — Care Management Important Message (Signed)
Important Message  Patient Details  Name: KAIREN HALLINAN MRN: [id number] Date of Birth: 03-17-1936   Medicare Important Message Given:  Yes     Shelda Altes 06/15/2022, 10:32 AM

## 2022-06-15 NOTE — Progress Notes (Unsigned)
Enrolled for [employer] to mail a ZIO XT long term holter monitor to the patients address on file.  Letter with instructions mailed to patient. 

## 2022-06-15 NOTE — Plan of Care (Signed)
  Problem: Education: Goal: Ability to demonstrate management of disease process will improve Outcome: Progressing   Problem: Education: Goal: Ability to verbalize understanding of medication therapies will improve Outcome: Progressing   Problem: Nutrition: Goal: Adequate nutrition will be maintained Outcome: Progressing   Problem: Skin Integrity: Goal: Risk for impaired skin integrity will decrease Outcome: Progressing

## 2022-06-15 NOTE — Consult Note (Signed)
   [facility] CM Inpatient Consult   06/15/2022  [name] 03-Jan-1936 [id number]   [facility] [ACO] Patient: Vincent [name] [facility]   Primary Care Provider: [name], MD, [facility] Physician at [facility], is an Independent Embedded provider with a Care Management team and program and is listed for the [facility] follow up needs     Patient screened for potential [place] Management service needs for post hospital transition for readmission prevention needs.  Review of patient's electronic medical record did not show any recent encounters for [facility].  This Probation officer accessed [facility] to check status and patient has seen primary provider. Met with patient at the bedside to encourage follow up.  Patient states he is monitored with [facility].     Plan:  Care Coordination and CCM needs are with [facility], will sign off.   For questions contact:    Natividad Brood, RN BSN [facility] Liaison  [phone] business mobile phone Toll free office [phone]  Fax number: [phone] [email or web address]

## 2022-06-15 NOTE — Progress Notes (Signed)
Patient is a still weak, he says he will agree to having home health at home.

## 2022-06-15 NOTE — TOC Transition Note (Addendum)
Transition of Care [facility]) - CM/SW Discharge Note   Patient Details  Name: Vincent [name] MRN: [id number] Date of Birth: 24-Oct-1935  Transition of Care [facility] CM/SW Contact:  Zenon Mayo, RN Phone Number: 06/15/2022, 10:11 AM   Clinical Narrative:    Patient is for dc today, NCM offered choice to patient with Medicare.gov list, he says he has had [place] in the past but does not have a preference.  NCM made referral to [facility] with [facility], she is able to take referral. Soc will begin 24 to 48 hrs post dc.  He states his friend , Weston Anna will transport him home today. He states he will be going to his sister's house at dc at the address of [address], [place] [address].  He states [facility] does his supplies for his DM .  He eats a low sodium diet, and he states he has a scale which he weighs himself daily and he will take his scale to his sisters house.  He states he will need to get him a bp cuff to check his bp.     Final next level of care: Home w Home Health Services Barriers to Discharge: No Barriers Identified   Patient Goals and CMS Choice Patient states their goals for this hospitalization and ongoing recovery are:: return home with his sister at her home CMS Medicare.gov Compare Post Acute Care list provided to:: Patient Choice offered to / list presented to : Patient  Discharge Placement                       Discharge Plan and Services                  DME Agency: NA       HH Arranged: RN, PT [facility] Agency: [facility] [facility] ([place]) Date [place]: 06/15/22 Time HH Agency Contacted: 1010 Representative spoke with at [place]: [place] ([place]) Interventions     Readmission Risk Interventions     No data to display

## 2022-06-18 DIAGNOSIS — G5732 Lesion of lateral popliteal nerve, left lower limb: Secondary | ICD-10-CM | POA: Diagnosis not present

## 2022-06-18 DIAGNOSIS — E785 Hyperlipidemia, unspecified: Secondary | ICD-10-CM | POA: Diagnosis not present

## 2022-06-18 DIAGNOSIS — E119 Type 2 diabetes mellitus without complications: Secondary | ICD-10-CM | POA: Diagnosis not present

## 2022-06-18 DIAGNOSIS — I110 Hypertensive heart disease with heart failure: Secondary | ICD-10-CM | POA: Diagnosis not present

## 2022-06-18 DIAGNOSIS — Z7984 Long term (current) use of oral hypoglycemic drugs: Secondary | ICD-10-CM | POA: Diagnosis not present

## 2022-06-18 DIAGNOSIS — Z7901 Long term (current) use of anticoagulants: Secondary | ICD-10-CM | POA: Diagnosis not present

## 2022-06-18 DIAGNOSIS — M21372 Foot drop, left foot: Secondary | ICD-10-CM | POA: Diagnosis not present

## 2022-06-18 DIAGNOSIS — Z87891 Personal history of nicotine dependence: Secondary | ICD-10-CM | POA: Diagnosis not present

## 2022-06-18 DIAGNOSIS — K219 Gastro-esophageal reflux disease without esophagitis: Secondary | ICD-10-CM | POA: Diagnosis not present

## 2022-06-18 DIAGNOSIS — I5043 Acute on chronic combined systolic (congestive) and diastolic (congestive) heart failure: Secondary | ICD-10-CM | POA: Diagnosis not present

## 2022-06-18 DIAGNOSIS — Z9181 History of falling: Secondary | ICD-10-CM | POA: Diagnosis not present

## 2022-06-18 DIAGNOSIS — J9601 Acute respiratory failure with hypoxia: Secondary | ICD-10-CM | POA: Diagnosis not present

## 2022-06-18 DIAGNOSIS — H409 Unspecified glaucoma: Secondary | ICD-10-CM | POA: Diagnosis not present

## 2022-06-18 DIAGNOSIS — M109 Gout, unspecified: Secondary | ICD-10-CM | POA: Diagnosis not present

## 2022-06-18 DIAGNOSIS — I4821 Permanent atrial fibrillation: Secondary | ICD-10-CM | POA: Diagnosis not present

## 2022-06-18 DIAGNOSIS — I69354 Hemiplegia and hemiparesis following cerebral infarction affecting left non-dominant side: Secondary | ICD-10-CM | POA: Diagnosis not present

## 2022-06-20 DIAGNOSIS — E1142 Type 2 diabetes mellitus with diabetic polyneuropathy: Secondary | ICD-10-CM | POA: Diagnosis not present

## 2022-06-20 DIAGNOSIS — I2510 Atherosclerotic heart disease of native coronary artery without angina pectoris: Secondary | ICD-10-CM | POA: Diagnosis not present

## 2022-06-20 DIAGNOSIS — D6869 Other thrombophilia: Secondary | ICD-10-CM | POA: Diagnosis not present

## 2022-06-20 DIAGNOSIS — I480 Paroxysmal atrial fibrillation: Secondary | ICD-10-CM | POA: Diagnosis not present

## 2022-06-20 DIAGNOSIS — I10 Essential (primary) hypertension: Secondary | ICD-10-CM | POA: Diagnosis not present

## 2022-06-29 ENCOUNTER — Encounter (HOSPITAL_COMMUNITY): Payer: Self-pay

## 2022-06-29 ENCOUNTER — Inpatient Hospital Stay (HOSPITAL_COMMUNITY)
Admission: RE | Admit: 2022-06-29 | Discharge: 2022-06-29 | Disposition: A | Discharge status: Home / Self Care | Payer: Medicare Other | Source: Ambulatory Visit | Attending: Cardiology | Admitting: Cardiology

## 2022-06-29 ENCOUNTER — Other Ambulatory Visit (HOSPITAL_COMMUNITY): Payer: Self-pay

## 2022-06-29 ENCOUNTER — Ambulatory Visit (HOSPITAL_COMMUNITY)
Admit: 2022-06-29 | Discharge: 2022-06-29 | Disposition: A | Discharge status: Home / Self Care | Payer: Medicare Other | Attending: Family Medicine | Admitting: Family Medicine

## 2022-06-29 VITALS — BP 110/68 | HR 77 | Wt 175.0 lb

## 2022-06-29 DIAGNOSIS — I5022 Chronic systolic (congestive) heart failure: Secondary | ICD-10-CM | POA: Diagnosis not present

## 2022-06-29 DIAGNOSIS — I83019 Varicose veins of right lower extremity with ulcer of unspecified site: Secondary | ICD-10-CM

## 2022-06-29 DIAGNOSIS — Z7901 Long term (current) use of anticoagulants: Secondary | ICD-10-CM | POA: Diagnosis not present

## 2022-06-29 DIAGNOSIS — L97909 Non-pressure chronic ulcer of unspecified part of unspecified lower leg with unspecified severity: Secondary | ICD-10-CM | POA: Diagnosis not present

## 2022-06-29 DIAGNOSIS — Z8673 Personal history of transient ischemic attack (TIA), and cerebral infarction without residual deficits: Secondary | ICD-10-CM | POA: Insufficient documentation

## 2022-06-29 DIAGNOSIS — Z7984 Long term (current) use of oral hypoglycemic drugs: Secondary | ICD-10-CM | POA: Insufficient documentation

## 2022-06-29 DIAGNOSIS — L97929 Non-pressure chronic ulcer of unspecified part of left lower leg with unspecified severity: Secondary | ICD-10-CM

## 2022-06-29 DIAGNOSIS — E785 Hyperlipidemia, unspecified: Secondary | ICD-10-CM

## 2022-06-29 DIAGNOSIS — Z823 Family history of stroke: Secondary | ICD-10-CM | POA: Diagnosis not present

## 2022-06-29 DIAGNOSIS — R42 Dizziness and giddiness: Secondary | ICD-10-CM | POA: Diagnosis not present

## 2022-06-29 DIAGNOSIS — R079 Chest pain, unspecified: Secondary | ICD-10-CM | POA: Insufficient documentation

## 2022-06-29 DIAGNOSIS — Z79899 Other long term (current) drug therapy: Secondary | ICD-10-CM | POA: Diagnosis not present

## 2022-06-29 DIAGNOSIS — I83029 Varicose veins of left lower extremity with ulcer of unspecified site: Secondary | ICD-10-CM

## 2022-06-29 DIAGNOSIS — E119 Type 2 diabetes mellitus without complications: Secondary | ICD-10-CM | POA: Diagnosis not present

## 2022-06-29 DIAGNOSIS — E11622 Type 2 diabetes mellitus with other skin ulcer: Secondary | ICD-10-CM

## 2022-06-29 DIAGNOSIS — I2510 Atherosclerotic heart disease of native coronary artery without angina pectoris: Secondary | ICD-10-CM | POA: Diagnosis not present

## 2022-06-29 DIAGNOSIS — Z955 Presence of coronary angioplasty implant and graft: Secondary | ICD-10-CM | POA: Insufficient documentation

## 2022-06-29 DIAGNOSIS — I4821 Permanent atrial fibrillation: Secondary | ICD-10-CM | POA: Diagnosis not present

## 2022-06-29 DIAGNOSIS — I10 Essential (primary) hypertension: Secondary | ICD-10-CM | POA: Diagnosis not present

## 2022-06-29 DIAGNOSIS — L97919 Non-pressure chronic ulcer of unspecified part of right lower leg with unspecified severity: Secondary | ICD-10-CM

## 2022-06-29 DIAGNOSIS — I4729 Other ventricular tachycardia: Secondary | ICD-10-CM

## 2022-06-29 DIAGNOSIS — I2583 Coronary atherosclerosis due to lipid rich plaque: Secondary | ICD-10-CM

## 2022-06-29 LAB — BASIC METABOLIC PANEL
Anion gap: 6 (ref 5–15)
BUN: 14 mg/dL (ref 8–23)
CO2: 29 mmol/L (ref 22–32)
Calcium: 9 mg/dL (ref 8.9–10.3)
Chloride: 107 mmol/L (ref 98–111)
Creatinine, Ser: 0.96 mg/dL (ref 0.61–1.24)
GFR, Estimated: >60 mL/min (ref >60)
Glucose, Bld: 104 mg/dL — ABNORMAL HIGH (ref 70–99)
Potassium: 4.1 mmol/L (ref 3.5–5.1)
Sodium: 142 mmol/L (ref 135–145)

## 2022-06-29 LAB — BRAIN NATRIURETIC PEPTIDE: B Natriuretic Peptide: 2767.3 pg/mL — ABNORMAL HIGH (ref 0.0–100.0)

## 2022-06-29 MED ORDER — NITROGLYCERIN 0.4 MG/SPRAY TL SOLN: 1.0000 | 6 refills | Status: AC | PRN

## 2022-06-29 MED ORDER — ATORVASTATIN CALCIUM 80 MG PO TABS: 80.0000 mg | ORAL_TABLET | Freq: Every day | ORAL | 11 refills | Status: AC

## 2022-06-29 MED ORDER — DIGOXIN 125 MCG PO TABS: 0.1250 mg | ORAL_TABLET | Freq: Every day | ORAL | 3 refills | Status: AC

## 2022-06-29 NOTE — Progress Notes (Addendum)
Primary Care: Dr [name]  HF Cardiology: Dr. Aundra Dubin  [name] is 86 y.o. with history of HFrEF, permanent AF, CAD, T2DM, CVA, left foot droop, and chronic wounds referred from [facility] clinic for CHF evaluation.  He is a retired Chief Executive Officer.   Admitted in 1/23 with increased shortness of breath in the setting of acute heart failure. On arrival EKG showed A fib/RVR with rate in the 100s. Diuresed with IV lasix. Echo completed with EF 25%. Had cath with borderline obstructive disease in the distal LCx, patent stent in the mid RCA. No change compared to 2016 cath. GDMT started.   He has had chronic lower extremity wounds and has been followed at the wound clinic.   Established with Dr. Aundra Dubin 03/23. Coreg increased and restarted entresto. Entresto further increased at visit with PharmD on 12/07/21.   Follow up 8/23, stable NYHA II symptoms and managing LE wound on his own. Echo (8/23) showed EF 25-30%, global HK, RV mildly reduced, mild to moderate MR, IVC not dilated.   Admitted 10/23 with syncope and fall, no LOC. SpO2 80% in ED, CXR consistent with CHF. Orthostatics negative. Cardiology consulted and given IV lasix, SpO2 improved with diuresis. Able to transition to oral torsemide and continued on home GDMT. Had a short run of NSVT on tele and Zio 2 week placed at discharge. PT rec rehab and he declined. He was discharged home, weight 172 lbs.  Today he returns for post hospital HF follow up. Overall feeling fine. He tells me he has never been dizzy, he only tripped, leading to a fall and subsequent hospitalization. He lives alone and has no dyspnea walking on flat ground or with ADLs. Denies palpitations, abnormal bleeding, CP, dizziness, edema, or PND/Orthopnea. Appetite ok. No fever or chills. Weight at home 170-175 pounds. Taking all medications, but says he does not have digoxin. He has [place] [facility] a couple times a week.  ECG (personally reviewed): atrial fibrillation, +LVH 91 bpm  Labs (1/23): LDL  77 Labs (2/23): K 4.1, creatinine 1.13 Labs (4/23): K 4.4, creatinine 1.05  Labs (10/23): K 3.5, creatinine 1.18, hgb 11.8  PMH: 1. CVA: Related to atrial fibrillation.  2. Atrial fibrillation: Permanent.  3. Chronic lower extremity wounds =>? Venous stasis, resolved.  4. Chronic systolic CHF: Suspect primarily nonischemic cardiomyopathy.  - Echo (2015): EF 40-45%.  - Echo (1/23): EF 35%, normal RV, mild-moderate MR, moderate TR.  - LHC/RHC (1/23): 70% distal LCx stenosis, patent RCA stent, 50% mRCA; mean PCWP 22, CI 2.67.  - Echo (8/23): EF 25-30%, global HK, RV mildly reduced, mild to moderate MR, IVC not dilated.  5. CAD: PCI to RCA remotely. - LHC in 1/23 with 70% distal LCx stenosis, patent RCA stent, 50% mRCA  Review of Systems: All systems reviewed and negative except as per HPI.   Current Outpatient Medications  Medication Sig Dispense Refill   acetaminophen (TYLENOL) 500 MG tablet Take 2 tablets (1,000 mg total) by mouth every 6 (six) hours as needed. (Patient taking differently: Take 1,000 mg by mouth every 6 (six) hours as needed for moderate pain or mild pain.) 30 tablet 0   apixaban (ELIQUIS) 5 MG TABS tablet Take 1 tablet (5 mg total) by mouth 2 (two) times daily. 60 tablet 5   Brimonidine Tartrate (ALPHAGAN P OP) Place 1 drop into both eyes in the morning, at noon, and at bedtime.     Calcium Carbonate-Vitamin D (OSCAL 500 D-3 PO) Take 1 tablet by  mouth 2 (two) times daily.     carvedilol (COREG) 6.25 MG tablet Take 1 tablet (6.25 mg total) by mouth 2 (two) times daily with a meal. 60 tablet 11   Cyanocobalamin (VITAMIN B-12 CR PO) Take 1 tablet by mouth daily.     empagliflozin (JARDIANCE) 25 MG TABS tablet Take 25 mg by mouth in the morning and at bedtime.     famotidine (PEPCID) 20 MG tablet Take 20 mg by mouth 2 (two) times daily.     Ferrous Sulfate (IRON PO) Take 1 tablet by mouth daily.     furosemide (LASIX) 40 MG tablet Take 40 mg by mouth 2 (two) times daily.      isosorbide mononitrate (IMDUR) 30 MG 24 hr tablet Take 1 tablet (30 mg total) by mouth daily. 90 tablet 3   loratadine (CLARITIN) 10 MG tablet Take 10 mg by mouth daily.     LUMIGAN 0.01 % SOLN Place 1 drop into both eyes at bedtime.     Magnesium 250 MG TABS Take 1 tablet by mouth 2 (two) times daily.     metFORMIN (GLUCOPHAGE) 500 MG tablet Take 500 mg by mouth 2 (two) times daily.     OCUFLOX 0.3 % ophthalmic solution Place 1 drop into the left eye 4 (four) times daily.     Omega-3 Fatty Acids (OMEGA-3 FISH OIL PO) Take 1 capsule by mouth daily.     PROLENSA 0.07 % SOLN Place 1 drop into the left eye daily.     sacubitril-valsartan (ENTRESTO) 97-103 MG Take 1 tablet by mouth 2 (two) times daily. 60 tablet 11   spironolactone (ALDACTONE) 25 MG tablet Take 25 mg by mouth daily.     atorvastatin (LIPITOR) 80 MG tablet Take 1 tablet (80 mg total) by mouth daily. (Patient not taking: Reported on 06/29/2022) 30 tablet 5   nitroGLYCERIN (NITROLINGUAL) 0.4 MG/SPRAY spray Place 1 spray under the tongue every 5 (five) minutes x 3 doses as needed for chest pain. Please call and schedule an appointment (Patient not taking: Reported on 06/29/2022) 12 g 0   No current facility-administered medications for this encounter.   No Known Allergies  Social History   Socioeconomic History   Marital status: Divorced    Spouse name: Not on file   Number of children: 0   Years of education: master's   Highest education level: Master's degree (e.g., MA, MS, MEng, MEd, MSW, MBA)  Occupational History   Occupation: semi-retired  Tobacco Use   Smoking status: Former    Packs/day: 0.50    Years: 15.00    Total pack years: 7.50    Types: Cigarettes    Quit date: 08/22/1983    Years since quitting: 38.8   Smokeless tobacco: Never  Vaping Use   Vaping Use: Never used  Substance and Sexual Activity   Alcohol use: No   Drug use: No   Sexual activity: Not on file  Other Topics Concern   Not on file   Social History Narrative   Patient drinks 1-2 cups of caffeine daily.   Patient is right handed.   Social Determinants of Health   Financial Resource Strain: Low Risk  (09/21/2021)   Overall Financial Resource Strain (CARDIA)    Difficulty of Paying Living Expenses: Not very hard  Food Insecurity: No Food Insecurity (06/11/2022)   Hunger Vital Sign    Worried About Running Out of Food in the Last Year: Never true    Ran Out of Food  in the Last Year: Never true  Transportation Needs: No Transportation Needs (06/11/2022)   PRAPARE - Hydrologist (Medical): No    Lack of Transportation (Non-Medical): No  Physical Activity: Not on file  Stress: Not on file  Social Connections: Not on file  Intimate Partner Violence: Not At Risk (06/11/2022)   Humiliation, Afraid, Rape, and Kick questionnaire    Fear of Current or Ex-Partner: No    Emotionally Abused: No    Physically Abused: No    Sexually Abused: No   Family History  Problem Relation Age of Onset   Colon cancer Mother    Hypertension Mother    Cancer Mother    Prostate cancer Brother    Cancer Brother    Heart disease Father    Heart attack Father    Hypertension Father    Heart attack Sister    Cancer Sister    Stroke Neg Hx    Colon polyps Neg Hx    Esophageal cancer Neg Hx    Stomach cancer Neg Hx    Rectal cancer Neg Hx    Pancreatic cancer Neg Hx    BP 110/68   Pulse 77   Wt 79.4 kg (175 lb)   SpO2 99%   BMI 25.84 kg/m   Wt Readings from Last 3 Encounters:  06/29/22 79.4 kg (175 lb)  06/15/22 78.1 kg (172 lb 2.9 oz)  03/29/22 80.2 kg (176 lb 12.8 oz)   PHYSICAL EXAM: General:  NAD. No resp difficulty, walked into clinic HEENT: Normal Neck: Supple. No JVD. Carotids 2+ bilat; no bruits. No lymphadenopathy or thryomegaly appreciated. Cor: PMI nondisplaced. Irregular rate & rhythm. No rubs, gallops or murmurs. Lungs: Clear Abdomen: Soft, nontender, nondistended. No  hepatosplenomegaly. No bruits or masses. Good bowel sounds. Extremities: No cyanosis, clubbing, rash, 1+ BLE edema, BLE with ace wraps Neuro: Alert & oriented x 3, cranial nerves grossly intact. Moves all 4 extremities w/o difficulty. Affect pleasant.  ASSESSMENT & PLAN: 1. Dizziness: Recent admit with dizziness and fall, no LOC. Orthostatics were negative. He is adamant he was never dizzy, and had a mechanical fall. Tele showed short run of NSVT. ? Transient arrhythmia or orthostasis. - BMET and BNP today. - Place Zio 2 weeks to look for arrhythmias. 2. Chronic systolic CHF: Probably predominantly NICM. LHC in 1/23 with patent RCA stent and 70% distal LCx stenosis.  Echo in 1/23 with EF 25%, mild-moderate MR and moderate TR.  Cardiomyopathy could be due to long-standing (permanent) atrial fibrillation.  No strong FH of CMP.  He denies heavy ETOH use. Echo (8/23) showed EF unchanged at 25-30%, RV mildly reduced. NYHA II stable. He is not volume overloaded today, suspect LE edema due to chronic venous stasis. - Restart digoxin 0.125 mg daily. Check dig level in 2 weeks. - Continue Coreg 6.125 mg bid. - Continue Entresto 97/103 mg bid. - Continue Jardiance 25 mg daily. No GU symptoms, watch closely as he had a strong urine smell today (? Incontinence). - Continue spironolactone 25 mg daily.   - Continue Lasix 40 mg bid. - EF remains < 35%, despite 3 months of maximum tolerated GDMT. Doubt he would be suitable ICD candidate given age and chronic LE wounds and risk for infection. He would not want a device.  3. Permanent atrial fibrillation: I do not think that he could be successfully cardioverted based on chronicity, has been in AF > 5 yrs.  - Continue rate control strategy  w/ ? blocker. - Continue apixaban 5 mg bid (appropriate dose based on body weight and renal function). No bleeding issues.  - Will need to watch for increased falls. Likely not a watchman candidate should AC need to be  stopped. 4. CAD: PCI to RCA remotely. Cath 1/23 with patent RCA stent, 70% dLCx.  - rare chest pain that is nitrate responsive.  Refill nitro spray PRN. - Continue Imdur 30 mg daily + Coreg 6.125 mg bid.   - Continue atorvastatin 80 daily, LDL 62 (3/23) - No ASA given apixaban use.  5. Lower extremity ulcers: Suspect 2/2 Venous stasis. LE arterial dopplers 4/23 w/ normal ABIs bilaterally. He is following at the [facility]. 6. DM II: A1c 7.0 (3/23).  - Continue Jardiance 7. Balance disturbance: Continue HH PT.  Follow up in 3-4 months with Dr. Aundra Dubin.  [place], FNP-BC 06/29/2022

## 2022-06-29 NOTE — Addendum Note (Signed)
Encounter addended by: Rafael Bihari, FNP on: 06/29/2022 1:30 PM  Actions taken: Clinical Note Signed

## 2022-06-29 NOTE — Patient Instructions (Addendum)
RESTART Digoxin 0.125 mg one tab daily  Labs today We will only contact you if something comes back abnormal or we need to make some changes. Otherwise no news is good news!  Labs needed in 2 weeks -on the morning of your lab appt, DONT take digoxin  Your provider has recommended that  you wear a Zio Patch for 14  days.  This monitor will record your heart rhythm for our review.  IF you have any symptoms while wearing the monitor please press the button.  If you have any issues with the patch or you notice a red or orange light on it please call the company at [phone].  Once you remove the patch please mail it back to the company as soon as possible so we can get the results.  Your physician wants you to follow-up in: 3 months with Dr Kendall Flack will receive a reminder letter in the mail two months in advance. If you don't receive a letter, please call our office to schedule the follow-up appointment.   Do the following things EVERYDAY: Weigh yourself in the morning before breakfast. Write it down and keep it in a log. Take your medicines as prescribed Eat low salt foods--Limit salt (sodium) to 2000 mg per day.  Stay as active as you can everyday Limit all fluids for the day to less than 2 liters  At the [facility], you and your health needs are our priority. As part of our continuing mission to provide you with exceptional heart care, we have created designated Provider Care Teams. These Care Teams include your primary Cardiologist (physician) and Advanced Practice Providers (APPs- Physician Assistants and Nurse Practitioners) who all work together to provide you with the care you need, when you need it.   You may see any of the following providers on your designated Care Team at your next follow up: Dr Glori Bickers Dr Loralie Champagne Dr. Roxana Hires, NP Lyda Jester, [place] [facility] [place], [place] Forestine Na, NP Audry Riles,  PharmD   Please be sure to bring in all your medications bottles to every appointment.   If you have any questions or concerns before your next appointment please send [place] a message through [place] or call our office at [phone].    TO LEAVE A MESSAGE FOR THE NURSE SELECT OPTION 2, PLEASE LEAVE A MESSAGE INCLUDING: YOUR NAME DATE OF BIRTH CALL BACK NUMBER REASON FOR CALL**this is important as we prioritize the call backs  YOU WILL RECEIVE A CALL BACK THE SAME DAY AS LONG AS YOU CALL BEFORE 4:00 PM

## 2022-07-05 ENCOUNTER — Encounter (HOSPITAL_COMMUNITY): Payer: Self-pay

## 2022-07-08 ENCOUNTER — Encounter (HOSPITAL_BASED_OUTPATIENT_CLINIC_OR_DEPARTMENT_OTHER): Payer: Medicare Other | Attending: General Surgery | Admitting: General Surgery

## 2022-07-08 DIAGNOSIS — I110 Hypertensive heart disease with heart failure: Secondary | ICD-10-CM | POA: Insufficient documentation

## 2022-07-08 DIAGNOSIS — E1140 Type 2 diabetes mellitus with diabetic neuropathy, unspecified: Secondary | ICD-10-CM | POA: Insufficient documentation

## 2022-07-08 DIAGNOSIS — I509 Heart failure, unspecified: Secondary | ICD-10-CM | POA: Insufficient documentation

## 2022-07-08 DIAGNOSIS — Z8673 Personal history of transient ischemic attack (TIA), and cerebral infarction without residual deficits: Secondary | ICD-10-CM | POA: Insufficient documentation

## 2022-07-08 DIAGNOSIS — E11622 Type 2 diabetes mellitus with other skin ulcer: Secondary | ICD-10-CM | POA: Insufficient documentation

## 2022-07-08 DIAGNOSIS — I2510 Atherosclerotic heart disease of native coronary artery without angina pectoris: Secondary | ICD-10-CM | POA: Diagnosis not present

## 2022-07-08 DIAGNOSIS — Z7901 Long term (current) use of anticoagulants: Secondary | ICD-10-CM | POA: Diagnosis not present

## 2022-07-08 DIAGNOSIS — E11621 Type 2 diabetes mellitus with foot ulcer: Secondary | ICD-10-CM | POA: Insufficient documentation

## 2022-07-08 DIAGNOSIS — L97811 Non-pressure chronic ulcer of other part of right lower leg limited to breakdown of skin: Secondary | ICD-10-CM | POA: Insufficient documentation

## 2022-07-08 DIAGNOSIS — I7300 Raynaud's syndrome without gangrene: Secondary | ICD-10-CM | POA: Diagnosis not present

## 2022-07-08 DIAGNOSIS — I4819 Other persistent atrial fibrillation: Secondary | ICD-10-CM | POA: Insufficient documentation

## 2022-07-08 DIAGNOSIS — L97821 Non-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 leg limited to breakdown of skin: Secondary | ICD-10-CM | POA: Insufficient documentation

## 2022-07-08 DIAGNOSIS — I872 Venous insufficiency (chronic) (peripheral): Secondary | ICD-10-CM | POA: Insufficient documentation

## 2022-07-11 NOTE — Progress Notes (Signed)
[name], [name] ([id number]) [phone] [id number] Page 1 of 1 Visit Report for 07/08/2022 Fall Risk Assessment Details Patient Name: Date of Service: [name] ID M. 07/08/2022 2:00 PM Medical Record Number: [id number] Patient Account Number: [id number] Date of Birth/Sex: Treating RN: 1936/04/10 (86 y.o. [name] Primary Care Amedio Bowlby: [name] Other Clinician: Referring Talena Neira: Treating Dontay Harm/Extender: Celine Ahr in Treatment: 12 Fall Risk Assessment Items Have you had 2 or more falls in the last 12 monthso 0 No Have you had any fall that resulted in injury in the last 12 monthso 0 No FALLS RISK SCREEN History of falling - immediate or within 3 months 25 Yes Secondary diagnosis (Do you have 2 or more medical diagnoseso) 0 No Ambulatory aid None/bed rest/wheelchair/nurse 0 Yes Crutches/cane/walker 0 No Furniture 0 No Intravenous therapy Access/Saline/Heparin Lock 0 No Gait/Transferring Normal/ bed rest/ wheelchair 0 Yes Weak (short steps with or without shuffle, stooped but able to lift head while walking, may seek 0 No support from furniture) Impaired (short steps with shuffle, may have difficulty arising from chair, head down, impaired 0 No balance) Mental Status Oriented to own ability 0 Yes Electronic Signature(s) Signed: 07/11/2022 12:51:09 PM By: Baruch Gouty RN, BSN Entered By: Baruch Gouty on 07/08/2022 14:15:47

## 2022-07-11 NOTE — Progress Notes (Signed)
Vincent [name] ([id number]) [id number] Page 1 of 15 Visit Report for 07/08/2022 Arrival Information Details Patient Name: Date of Service: [name] ID M. 07/08/2022 2:00 PM Medical Record Number: [id number] Patient Account Number: [id number] Date of Birth/Sex: Treating RN: [birth year] (86 y.o. [name] Primary Care Jacoya Bauman: [name] Other Clinician: Referring Raechelle Sarti: Treating Ivin Rosenbloom/Extender: Celine Ahr in Treatment: 12 Visit Information History Since Last Visit Added or deleted any medications: No Patient Arrived: Ambulatory Any new allergies or adverse reactions: No Arrival Time: 14:11 Had a fall or experienced change in Yes Accompanied By: self activities of daily living that may affect Transfer Assistance: None risk of falls: Patient Identification Verified: Yes Signs or symptoms of abuse/neglect since last visito No Secondary Verification Process Completed: Yes Hospitalized since last visit: Yes Patient Requires Transmission-Based Precautions: No Has Dressing in Place as Prescribed: Yes Patient Has Alerts: Yes Has Compression in Place as Prescribed: Yes Patient Alerts: Patient on Blood Thinner Pain Present Now: Yes Electronic Signature(s) Signed: 07/11/2022 12:51:09 PM By: Baruch Gouty RN, BSN Entered By: Baruch Gouty on 07/08/2022 14:12:59 -------------------------------------------------------------------------------- Encounter Discharge Information Details Patient Name: Date of Service: [name], [name] 07/08/2022 2:00 PM Medical Record Number: [id number] Patient Account Number: [id number] Date of Birth/Sex: Treating RN: Jun 04, 1936 (86 y.o. [name] Primary Care Fateh Kindle: [name] Other Clinician: Referring Damarrion Mimbs: Treating Conard Alvira/Extender: Celine Ahr in Treatment: 12 Encounter Discharge Information Items Post Procedure  Vitals Discharge Condition: Stable Temperature (F): 97.7 Ambulatory Status: Ambulatory Pulse (bpm): 83 Discharge Destination: Home Respiratory Rate (breaths/min): 18 Transportation: Private Auto Blood Pressure (mmHg): 122/69 Accompanied By: self Schedule Follow-up Appointment: Yes Clinical Summary of Care: Patient Declined Electronic Signature(s) Signed: 07/11/2022 12:51:09 PM By: Baruch Gouty RN, BSN Entered By: Baruch Gouty on 07/08/2022 15:05:08 [name], [name] ([id number]) [id number] Page 2 of 15 -------------------------------------------------------------------------------- Lower Extremity Assessment Details Patient Name: Date of Service: [name] ID M. 07/08/2022 2:00 PM Medical Record Number: [id number] Patient Account Number: [id number] Date of Birth/Sex: Treating RN: [birth year] (86 y.o. [name] Primary Care Denzal Meir: [name] Other Clinician: Referring Karenann Mcgrory: Treating Seraphina Mitchner/Extender: Celine Ahr in Treatment: 12 Edema Assessment Assessed: [Left: No] Patrice Paradise: No] Edema: [Left: Yes] [Right: Yes] Calf Left: Right: Point of Measurement: From Medial Instep 35.5 cm 40.3 cm Ankle Left: Right: Point of Measurement: From Medial Instep 24.7 cm 24.5 cm Knee To Floor Left: Right: From Medial Instep 43 cm 43 cm Vascular Assessment Pulses: Dorsalis Pedis Palpable: [Left:No] [Right:Yes] Electronic Signature(s) Signed: 07/11/2022 12:51:09 PM By: Baruch Gouty RN, BSN Entered By: Baruch Gouty on 07/08/2022 14:45:19 -------------------------------------------------------------------------------- Multi Wound Chart Details Patient Name: Date of Service: [name], [name] 07/08/2022 2:00 PM Medical Record Number: [id number] Patient Account Number: [id number] Date of Birth/Sex: Treating RN: [birth year] (86 y.o. M) Primary Care Sheliah Fiorillo: [name] Other Clinician: Referring  Marlicia Sroka: Treating Keyaan Lederman/Extender: Celine Ahr in Treatment: 12 Vital Signs Height(in): 7 Capillary Blood Glucose(mg/dl): 95 Weight(lbs): 175 Pulse(bpm): 91 Body Mass Index(BMI): 25.8 Blood Pressure(mmHg): 122/69 Temperature(F): 97.7 Respiratory Rate(breaths/min): 18 [14:Photos:] Viets, Pratik M ([id number]) [14:Right, Circumferential Lower Leg Wound Location: Blister Wounding Event: Venous Leg Ulcer Primary Etiology: Diabetic Wound/Ulcer of the Lower Secondary Etiology: Extremity Cataracts, Glaucoma, Chronic Comorbid History: Obstructive Pulmonary Disease  (COPD), Arrhythmia, Congestive Heart Failure, Coronary Artery Disease, Hypertension, Myocardial Disease, Hypertension, Myocardial Infarction, Type II Diabetes, Gout, Infarction, Type II Diabetes, Gout, Neuropathy 05/09/2022 Date Acquired: 6 Weeks  of  Treatment: Open Wound Status: No Wound Recurrence: Yes Clustered Wound: 4 Clustered Quantity: 5.3x2.2x0.1 Measurements L x W x D (cm) 9.158 A (cm) : rea 0.916 Volume (cm) : 97.90% % Reduction in A rea: 97.90% % Reduction in Volume: Full Thickness  Without Exposed Classification: Support Structures Medium Exudate A mount: Serous Exudate Type: amber Exudate Color: Flat and Intact Wound Margin: Medium (34-66%) Granulation A mount: Red Granulation Quality: Medium (34-66%) Necrotic A mount: Fat Layer  (Subcutaneous Tissue): Yes Fascia: No Exposed Structures: Fascia: No Tendon: No Muscle: No Joint: No Bone: No Medium (34-66%) Epithelialization: Debridement - Selective/Open Wound N/A Debridement: Pre-procedure Verification/Time Out 14:40 Taken:  Lidocaine 4% Topical Solution Pain Control: Slough Tissue Debrided: Non-Viable Tissue Level: 6.6 Debridement A (sq cm): rea Curette Instrument: Minimum Bleeding: Pressure Hemostasis A chieved: 0 Procedural Pain: 0 Post Procedural Pain: Procedure was  tolerated well Debridement Treatment Response: 5.3x2.2x0.1 Post Debridement  Measurements L x W x D (cm) 0.916 Post Debridement Volume: (cm) No Abnormalities Noted Periwound Skin Texture: Dry/Scaly: Yes Periwound Skin Moisture: Maceration: No Hemosiderin  Staining: Yes Periwound Skin Color: No Abnormality Temperature: Debridement Procedures Performed:] [15:Left, Anterior Lower Leg Blister 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 Cataracts, Glaucoma, Chronic Obstructive Pulmonary  Disease (COPD), Arrhythmia, Congestive Heart Failure, Coronary Artery Neuropathy 05/09/2022 6 Open No No N/A 0x0x0 0 0 100.00% 100.00% Full Thickness Without Exposed Support Structures None Present N/A N/A N/A None Present (0%) N/A None Present (0%) Fat  Layer (Subcutaneous Tissue): No Tendon: No Muscle: No Joint: No Bone: No Large (67-100%) N/A N/A N/A N/A N/A N/A N/A N/A N/A N/A N/A N/A N/A No Abnormalities Noted No Abnormalities Noted Hemosiderin Staining: Yes No Abnormality N/A]  [id number] Page 3 of 15 Left, Lateral Lower Leg Blister 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 Cataracts, Glaucoma, Chronic Obstructive Pulmonary Disease (COPD), Arrhythmia, Congestive Heart Failure,  Coronary Artery Disease, Hypertension, Myocardial Infarction, Type II Diabetes, Gout, Neuropathy 05/09/2022 6 Open No Yes 2 2.1x3.9x0.1 6.432 0.643 81.00% 81.00% Full Thickness Without Exposed Support Structures Medium Serous amber Flat and Intact Large  (67-100%) Red None Present (0%) Fat Layer (Subcutaneous Tissue): Yes Fascia: No Tendon: No Muscle: No Joint: No Bone: No Small (1-33%) Debridement - Selective/Open Wound 14:40 Lidocaine 4% Topical Solution Slough Non-Viable Tissue 8.19 Curette Minimum  Pressure 0 0 Procedure was tolerated well 2.1x3.9x0.1 0.643 No Abnormalities Noted No Abnormalities Noted Hemosiderin Staining: Yes No Abnormality Debridement] Wound Number: _0 Photos: Left, Medial Lower Leg Right, Lateral Lower Leg Right, Medial Lower  Leg Wound Location: Blister Gradually Appeared Gradually Appeared Wounding Event: Venous Leg Ulcer Lymphedema Lymphedema Primary Etiology: Diabetic Wound/Ulcer of the Lower Diabetic Wound/Ulcer of the Lower Diabetic Wound/Ulcer of the Lower Secondary Etiology: Extremity Extremity Extremity Cataracts, Glaucoma, Chronic Cataracts, Glaucoma, Chronic Cataracts, Glaucoma, Chronic Comorbid History: Obstructive Pulmonary Disease Obstructive Pulmonary Disease Obstructive Pulmonary Disease (COPD), Arrhythmia, Congestive (COPD), Arrhythmia, Congestive (COPD), Arrhythmia, Congestive Heart Failure, Coronary Artery Heart Failure, Coronary Artery Heart Failure, Coronary Artery Disease, Hypertension, Myocardial Disease, Hypertension, Myocardial Disease, Hypertension, Myocardial Infarction, Type II Diabetes, Gout, Infarction, Type II Diabetes, Gout, Infarction, Type II Diabetes, Gout, Neuropathy Neuropathy Neuropathy 05/09/2022 07/08/2022 07/08/2022 Date Acquired: DEKE, TILGHMAN ([id number]) [phone].pdf Page 4 of 15 6 0 0 Weeks of Treatment: Open Open Open Wound Status: No No No Wound Recurrence: Yes No No Clustered Wound: 6 N/A N/A Clustered Quantity: 0x0x0 0.9x2.3x0.1 1.7x1x0.1 Measurements L x W x D (cm) 0 1.626 1.335 A (  cm) : rea 0 0.163 0.134 Volume (cm) : 100.00% N/A N/A % Reduction in Area: 100.00% N/A N/A % Reduction in Volume: Full Thickness Without Exposed Full Thickness Without Exposed Full Thickness Without Exposed Classification: Support Structures Support Structures Support Structures None Present Medium Medium Exudate A mount: N/A Serous Serous Exudate Type: N/A amber amber Exudate Color: N/A Flat and Intact Flat and Intact Wound Margin: None Present (0%) Large (67-100%) Large (67-100%) Granulation A mount: N/A Red Red Granulation Quality: None Present (0%) Small (1-33%) Small (1-33%) Necrotic A mount: Fascia: No Fat Layer  (Subcutaneous Tissue): Yes Fat Layer (Subcutaneous Tissue): Yes Exposed Structures: Fat Layer (Subcutaneous Tissue): No Fascia: No Fascia: No Tendon: No Tendon: No Tendon: No Muscle: No Muscle: No Muscle: No Joint: No Joint: No Joint: No Bone: No Bone: No Bone: No Large (67-100%) Small (1-33%) Small (1-33%) Epithelialization: N/A Debridement - Selective/Open Wound Debridement - Selective/Open Wound Debridement: Pre-procedure Verification/Time Out N/A 14:40 14:40 Taken: N/A Lidocaine 4% Topical Solution Lidocaine 4% Topical Solution Pain Control: N/A [employer] Tissue Debrided: N/A Non-Viable Tissue Non-Viable Tissue Level: N/A 2.07 1.7 Debridement A (sq cm): rea N/A Curette Curette Instrument: N/A Minimum Minimum Bleeding: N/A Pressure Pressure Hemostasis A chieved: N/A 0 0 Procedural Pain: N/A 0 0 Post Procedural Pain: N/A Procedure was tolerated well Procedure was tolerated well Debridement Treatment Response: N/A 0.9x2.3x0.1 1.7x1x0.1 Post Debridement Measurements L x W x D (cm) N/A 0.163 0.134 Post Debridement Volume: (cm) No Abnormalities Noted No Abnormalities Noted No Abnormalities Noted Periwound Skin Texture: No Abnormalities Noted Dry/Scaly: Yes Dry/Scaly: Yes Periwound Skin Moisture: Maceration: No Hemosiderin Staining: Yes Hemosiderin Staining: Yes Hemosiderin Staining: Yes Periwound Skin Color: No Abnormality No Abnormality No Abnormality Temperature: N/A Debridement Debridement Procedures Performed: Treatment Notes Electronic Signature(s) Signed: 07/08/2022 3:04:30 PM By: Fredirick Maudlin MD FACS Entered By: Fredirick Maudlin on 07/08/2022 15:04:30 -------------------------------------------------------------------------------- Multi-Disciplinary Care Plan Details Patient Name: Date of Service: [name], [name] 07/08/2022 2:00 PM Medical Record Number: [id number] Patient Account Number: [id number] Date of Birth/Sex: Treating  RN: 06-09-1936 (85 y.o. [name] Primary Care Aarilyn Dye: [name] Other Clinician: Referring Parks Czajkowski: Treating Kahealani Yankovich/Extender: Celine Ahr in Treatment: [place] reviewed with physician Active Inactive Venous Leg Ulcer Baune, Kato M ([id number]) [id number] Page 5 of 15 Nursing Diagnoses: Knowledge deficit related to disease process and management Potential for venous Insuffiency (use before diagnosis confirmed) Goals: Patient will maintain optimal edema control Date Initiated: 05/24/2022 Target Resolution Date: 07/19/2022 Goal Status: Active Interventions: Assess peripheral edema status every visit. Compression as ordered Provide education on venous insufficiency Treatment Activities: Therapeutic compression applied : 05/24/2022 Notes: Wound/Skin Impairment Nursing Diagnoses: Impaired tissue integrity Knowledge deficit related to ulceration/compromised skin integrity Goals: Patient/caregiver will verbalize understanding of skin care regimen Date Initiated: 05/24/2022 Target Resolution Date: 07/19/2022 Goal Status: Active Ulcer/skin breakdown will have a volume reduction of 30% by week 4 Date Initiated: 05/24/2022 Date Inactivated: 07/08/2022 Target Resolution Date: 06/21/2022 Goal Status: Met Ulcer/skin breakdown will have a volume reduction of 50% by week 8 Date Initiated: 07/08/2022 Target Resolution Date: 07/19/2022 Goal Status: Active Interventions: Assess ulceration(s) every visit Treatment Activities: Skin care regimen initiated : 04/15/2022 Topical wound management initiated : 04/15/2022 Notes: Electronic Signature(s) Signed: 07/11/2022 12:51:09 PM By: Baruch Gouty RN, BSN Entered By: Baruch Gouty on 07/08/2022 14:33:39 -------------------------------------------------------------------------------- Pain Assessment Details Patient Name: Date of Service: [name],  [name] 07/08/2022 2:00 PM Medical Record Number: [id number] Patient Account Number: [id number]  Date of Birth/Sex: Treating RN: 1936-01-06 (86 y.o. [name] Primary Care Tavien Chestnut: [name] Other Clinician: Referring Dyson Sevey: Treating Johnmatthew Solorio/Extender: Celine Ahr in Treatment: 12 Active Problems Location of Pain Severity and Description of Pain Patient Has Paino Yes Site Locations Pain Location: PATRICE, MOATES ([id number]) [id number] Page 6 of 15 Pain Location: Pain in Ulcers With Dressing Change: Yes Duration of the Pain. Constant / Intermittento Intermittent Rate the pain. Current Pain Level: 0 Worst Pain Level: 3 Least Pain Level: 0 Character of Pain Describe the Pain: Other: stinging Pain Management and Medication Current Pain Management: Medication: Yes Is the Current Pain Management Adequate: Adequate How does your wound impact your activities of daily livingo Sleep: No Bathing: No Appetite: No Relationship With Others: No Bladder Continence: No Emotions: No Bowel Continence: No Work: No Toileting: No Drive: No Dressing: No Hobbies: No Electronic Signature(s) Signed: 07/11/2022 12:51:09 PM By: Baruch Gouty RN, BSN Entered By: Baruch Gouty on 07/08/2022 14:14:29 -------------------------------------------------------------------------------- Patient/Caregiver Education Details Patient Name: Date of Service: [name] ID M. 11/10/2023andnbsp2:00 PM Medical Record Number: [id number] Patient Account Number: [id number] Date of Birth/Gender: Treating RN: [birth year] (86 y.o. [name] Primary Care Physician: [name] Other Clinician: Referring Physician: Treating Physician/Extender: Celine Ahr in Treatment: 12 Education Assessment Education Provided To: Patient Education Topics Provided Venous: Methods: Explain/Verbal Responses:  Reinforcements needed, State content correctly Wound/Skin Impairment: Methods: Explain/Verbal Responses: State content correctly Electronic Signature(s) Signed: 07/11/2022 12:51:09 PM By: Baruch Gouty RN, BSN Moncivais, Philemon M ([id number]) [phone].pdf Page 7 of 15 Entered By: Baruch Gouty on 07/08/2022 14:34:16 -------------------------------------------------------------------------------- Wound Assessment Details Patient Name: Date of Service: [name] ID M. 07/08/2022 2:00 PM Medical Record Number: [id number] Patient Account Number: [id number] Date of Birth/Sex: Treating RN: [birth year] (86 y.o. [name] Primary Care Deangela Randleman: [name] Other Clinician: Referring Deklen Popelka: Treating Murad Staples/Extender: Celine Ahr in Treatment: 12 Wound Status Wound Number: 14 Primary Venous Leg Ulcer Etiology: Wound Location: Right, Circumferential Lower Leg Secondary Diabetic Wound/Ulcer of the Lower Extremity Wounding Event: Blister Etiology: Date Acquired: 05/09/2022 Wound Open Weeks Of Treatment: 6 Status: Clustered Wound: Yes Comorbid Cataracts, Glaucoma, Chronic Obstructive Pulmonary Disease History: (COPD), Arrhythmia, Congestive Heart Failure, Coronary Artery Disease, Hypertension, Myocardial Infarction, Type II Diabetes, Gout, Neuropathy Photos Wound Measurements Length: (cm) Width: (cm) Depth: (cm) Clustered Quantity: Area: (cm) Volume: (cm) 5.3 % Reduction in Area: 97.9% 2.2 % Reduction in Volume: 97.9% 0.1 Epithelialization: Medium (34-66%) 4 Tunneling: No 9.158 Undermining: No 0.916 Wound Description Classification: Full Thickness Without Exposed Sup Wound Margin: Flat and Intact Exudate Amount: Medium Exudate Type: Serous Exudate Color: amber port Structures Foul Odor After Cleansing: No Slough/Fibrino Yes Wound Bed Granulation Amount: Medium (34-66%) Exposed Structure Granulation  Quality: Red Fascia Exposed: No Necrotic Amount: Medium (34-66%) Fat Layer (Subcutaneous Tissue) Exposed: Yes Necrotic Quality: Adherent Slough Tendon Exposed: No Muscle Exposed: No Joint Exposed: No Bone Exposed: No Periwound Skin Texture Texture Color No Abnormalities Noted: Yes No Abnormalities Noted: No Hemosiderin Staining: Yes Moisture No Abnormalities Noted: No Temperature / Pain Dry / Scaly: Yes Temperature: No Abnormality Vidana, Tymir Jerilynn Mages ([id number]) [id number] Page 8 of 15 Maceration: No Treatment Notes Wound #14 (Lower Leg) Wound Laterality: Right, Circumferential Cleanser Peri-Wound Care Sween Lotion (Moisturizing lotion) Discharge Instruction: Apply moisturizing lotion as directed Topical Primary Dressing KerraCel Ag Gelling Fiber Dressing, 4x5 in (silver alginate) Discharge Instruction: Apply silver alginate to wound bed as instructed Secondary Dressing ABD Pad,  8x10 Discharge Instruction: Apply over primary dressing as directed. Secured With Compression Wrap Kerlix Roll 4.5x3.1 (in/yd) Discharge Instruction: Apply Kerlix and Coban compression as directed. Coban Self-Adherent Wrap 4x5 (in/yd) Discharge Instruction: Apply over Kerlix as directed. Compression Stockings Add-Ons Electronic Signature(s) Signed: 07/08/2022 3:22:45 PM By: Worthy Rancher Signed: 07/11/2022 12:51:09 PM By: Baruch Gouty RN, BSN Entered By: Worthy Rancher on 07/08/2022 14:32:03 -------------------------------------------------------------------------------- Wound Assessment Details Patient Name: Date of Service: [name], [name] 07/08/2022 2:00 PM Medical Record Number: [id number] Patient Account Number: [id number] Date of Birth/Sex: Treating RN: 1936/01/04 (86 y.o. [name] Primary Care Pranay Hilbun: [name] Other Clinician: Referring Edithe Dobbin: Treating Royalty Fakhouri/Extender: Celine Ahr in Treatment: 12 Wound  Status Wound Number: 15 Primary Venous Leg Ulcer Etiology: Wound Location: Left, Anterior Lower Leg Secondary Diabetic Wound/Ulcer of the Lower Extremity Wounding Event: Blister Etiology: Date Acquired: 05/09/2022 Wound Open Weeks Of Treatment: 6 Status: Clustered Wound: No Comorbid Cataracts, Glaucoma, Chronic Obstructive Pulmonary Disease History: (COPD), Arrhythmia, Congestive Heart Failure, Coronary Artery Disease, Hypertension, Myocardial Infarction, Type II Diabetes, Gout, Neuropathy Photos Vincent [name] ([id number]) [id number] Page 9 of 15 Wound Measurements Length: (cm) Width: (cm) Depth: (cm) Area: (cm) Volume: (cm) 0 % Reduction in Area: 100% 0 % Reduction in Volume: 100% 0 Epithelialization: Large (67-100%) 0 Tunneling: No 0 Undermining: No Wound Description Classification: Full Thickness Without Exposed Support Structures Exudate Amount: None Present Foul Odor After Cleansing: No Slough/Fibrino No Wound Bed Granulation Amount: None Present (0%) Exposed Structure Necrotic Amount: None Present (0%) Fascia Exposed: No Fat Layer (Subcutaneous Tissue) Exposed: No Tendon Exposed: No Muscle Exposed: No Joint Exposed: No Bone Exposed: No Periwound Skin Texture Texture Color No Abnormalities Noted: Yes No Abnormalities Noted: No Hemosiderin Staining: Yes Moisture No Abnormalities Noted: Yes Temperature / Pain Temperature: No Abnormality Electronic Signature(s) Signed: 07/08/2022 3:22:45 PM By: Worthy Rancher Signed: 07/11/2022 12:51:09 PM By: Baruch Gouty RN, BSN Entered By: Worthy Rancher on 07/08/2022 14:34:35 -------------------------------------------------------------------------------- Wound Assessment Details Patient Name: Date of Service: [name], [name] 07/08/2022 2:00 PM Medical Record Number: [id number] Patient Account Number: [id number] Date of Birth/Sex: Treating RN: Apr 21, 1936 (86 y.o. [name] Primary  Care Anhar Mcdermott: [name] Other Clinician: Referring Hermann Dottavio: Treating Karlita Lichtman/Extender: Celine Ahr in Treatment: 12 Wound Status Wound Number: 16 Primary Venous Leg Ulcer Etiology: Wound Location: Left, Lateral Lower Leg Secondary Diabetic Wound/Ulcer of the Lower Extremity Wounding Event: Blister Etiology: Date Acquired: 05/09/2022 Wound Open Weeks Of Treatment: 6 Status: Clustered Wound: Yes Comorbid Cataracts, Glaucoma, Chronic Obstructive Pulmonary Disease History: (COPD), Arrhythmia, Congestive Heart Failure, Coronary Artery Disease, Hypertension, Myocardial Infarction, Type II Diabetes, Gout, Neuropathy Park, Jayon M ([id number]) [id number] Page 10 of 15 Photos Wound Measurements Length: (cm) Width: (cm) Depth: (cm) Clustered Quantity: Area: (cm) Volume: (cm) 2.1 % Reduction in Area: 81% 3.9 % Reduction in Volume: 81% 0.1 Epithelialization: Small (1-33%) 2 Tunneling: No 6.432 Undermining: No 0.643 Wound Description Classification: Full Thickness Without Exposed Sup Wound Margin: Flat and Intact Exudate Amount: Medium Exudate Type: Serous Exudate Color: amber port Structures Foul Odor After Cleansing: No Slough/Fibrino No Wound Bed Granulation Amount: Large (67-100%) Exposed Structure Granulation Quality: Red Fascia Exposed: No Necrotic Amount: None Present (0%) Fat Layer (Subcutaneous Tissue) Exposed: Yes Tendon Exposed: No Muscle Exposed: No Joint Exposed: No Bone Exposed: No Periwound Skin Texture Texture Color No Abnormalities Noted: Yes No Abnormalities Noted: No Hemosiderin Staining: Yes Moisture No Abnormalities Noted: Yes Temperature / Pain Temperature: No  Abnormality Treatment Notes Wound #16 (Lower Leg) Wound Laterality: Left, Lateral Cleanser Peri-Wound Care Sween Lotion (Moisturizing lotion) Discharge Instruction: Apply moisturizing lotion as directed Topical Primary  Dressing KerraCel Ag Gelling Fiber Dressing, 4x5 in (silver alginate) Discharge Instruction: Apply silver alginate to wound bed as instructed Secondary Dressing ABD Pad, 8x10 Discharge Instruction: Apply over primary dressing as directed. Secured With Compression Wrap Kerlix Roll 4.5x3.1 (in/yd) Discharge Instruction: Apply Kerlix and Coban compression as directed. Coban Self-Adherent Wrap 4x5 (in/yd) Discharge Instruction: Apply over Kerlix as directed. Vincent [name] ([id number]) [id number] Page 11 of 15 Compression Stockings Add-Ons Electronic Signature(s) Signed: 07/08/2022 3:22:45 PM By: Worthy Rancher Signed: 07/11/2022 12:51:09 PM By: Baruch Gouty RN, BSN Entered By: Worthy Rancher on 07/08/2022 14:33:22 -------------------------------------------------------------------------------- Wound Assessment Details Patient Name: Date of Service: [name], [name] 07/08/2022 2:00 PM Medical Record Number: [id number] Patient Account Number: [id number] Date of Birth/Sex: Treating RN: 12/07/1935 (86 y.o. [name] Primary Care Deena Shaub: [name] Other Clinician: Referring Ulysses Alper: Treating Ashlynn Gunnels/Extender: Celine Ahr in Treatment: 12 Wound Status Wound Number: 17 Primary Venous Leg Ulcer Etiology: Wound Location: Left, Medial Lower Leg Secondary Diabetic Wound/Ulcer of the Lower Extremity Wounding Event: Blister Etiology: Date Acquired: 05/09/2022 Wound Open Weeks Of Treatment: 6 Status: Clustered Wound: Yes Comorbid Cataracts, Glaucoma, Chronic Obstructive Pulmonary Disease History: (COPD), Arrhythmia, Congestive Heart Failure, Coronary Artery Disease, Hypertension, Myocardial Infarction, Type II Diabetes, Gout, Neuropathy Photos Wound Measurements Length: (cm) Width: (cm) Depth: (cm) Clustered Quantity: Area: (cm) Volume: (cm) 0 % Reduction in Area: 100% 0 % Reduction in Volume: 100% 0  Epithelialization: Large (67-100%) 6 Tunneling: No 0 Undermining: No 0 Wound Description Classification: Full Thickness Without Exposed Sup Exudate Amount: None Present port Structures Foul Odor After Cleansing: No Slough/Fibrino No Wound Bed Granulation Amount: None Present (0%) Exposed Structure Necrotic Amount: None Present (0%) Fascia Exposed: No Fat Layer (Subcutaneous Tissue) Exposed: No Tendon Exposed: No Muscle Exposed: No Joint Exposed: No Bone Exposed: No Periwound Skin Texture Finelli, Youlanda Roys ([id number]) [id number] Page 12 of 15 Texture Color No Abnormalities Noted: Yes No Abnormalities Noted: No Hemosiderin Staining: Yes Moisture No Abnormalities Noted: Yes Temperature / Pain Temperature: No Abnormality Electronic Signature(s) Signed: 07/08/2022 3:22:45 PM By: Worthy Rancher Signed: 07/11/2022 12:51:09 PM By: Baruch Gouty RN, BSN Entered By: Worthy Rancher on 07/08/2022 14:33:54 -------------------------------------------------------------------------------- Wound Assessment Details Patient Name: Date of Service: [name], [name] 07/08/2022 2:00 PM Medical Record Number: [id number] Patient Account Number: [id number] Date of Birth/Sex: Treating RN: Jun 23, 1936 (86 y.o. 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 Event: Gradually Appeared Etiology: Date Acquired: 07/08/2022 Wound Open Weeks Of Treatment: 0 Status: Clustered Wound: No Comorbid Cataracts, Glaucoma, Chronic Obstructive Pulmonary Disease History: (COPD), Arrhythmia, Congestive Heart Failure, Coronary Artery Disease, Hypertension, Myocardial Infarction, Type II  Diabetes, Gout, Neuropathy Photos Wound Measurements Length: (cm) 0.9 Width: (cm) 2.3 Depth: (cm) 0.1 Area: (cm) 1.626 Volume: (cm) 0.163 % Reduction in Area: % Reduction in Volume: Epithelialization: Small (1-33%) Tunneling: No Undermining: No Wound Description Classification: Full Thickness Without Exposed Suppor Wound Margin: Flat and Intact Exudate Amount: Medium Exudate Type: Serous Exudate Color: amber t Structures Foul Odor After Cleansing: No Slough/Fibrino Yes Wound Bed Granulation Amount: Large (67-100%) Exposed Structure Granulation Quality: Red Fascia Exposed:  No Necrotic Amount: Small (1-33%) Fat Layer (Subcutaneous Tissue) Exposed: Yes Necrotic Quality: Adherent Slough Tendon Exposed: No Muscle Exposed: No [place], Ahmeer Jerilynn Mages ([id number]) [id number] Page 13 of 15 Joint Exposed: No Bone Exposed: No Periwound Skin Texture Texture Color No Abnormalities Noted: Yes No Abnormalities Noted: No Hemosiderin Staining: Yes Moisture No Abnormalities Noted: No Temperature / Pain Dry / Scaly: Yes Temperature: No Abnormality Maceration: No Treatment Notes Wound #18 (Lower Leg) Wound Laterality: Right, Lateral Cleanser Peri-Wound Care Sween Lotion (Moisturizing lotion) Discharge Instruction: Apply moisturizing lotion as directed Topical Primary Dressing KerraCel Ag Gelling Fiber Dressing, 4x5 in (silver alginate) Discharge Instruction: Apply silver alginate to wound bed as instructed Secondary Dressing ABD Pad, 8x10 Discharge Instruction: Apply over primary dressing as directed. Secured With Compression Wrap Kerlix Roll 4.5x3.1 (in/yd) Discharge Instruction: Apply Kerlix and Coban compression as directed. Coban Self-Adherent Wrap 4x5 (in/yd) Discharge Instruction: Apply over Kerlix as directed. Compression Stockings Add-Ons Electronic Signature(s) Signed: 07/08/2022 3:22:45 PM By: Worthy Rancher Signed: 07/11/2022 12:51:09 PM By:  Baruch Gouty RN, BSN Entered By: Worthy Rancher on 07/08/2022 14:36:06 -------------------------------------------------------------------------------- Wound Assessment Details Patient Name: Date of Service: [name], [name] 07/08/2022 2:00 PM Medical Record Number: [id number] Patient Account Number: [id number] Date of Birth/Sex: Treating RN: 1936-04-19 (86 y.o. 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 Event: Gradually Appeared Etiology: Date Acquired: 07/08/2022 Wound Open Weeks Of Treatment: 0 Status: Clustered Wound: No Comorbid Cataracts, Glaucoma, Chronic Obstructive Pulmonary Disease History: (COPD), Arrhythmia, Congestive Heart Failure, Coronary Artery Disease, Hypertension, Myocardial Infarction, Type II Diabetes, Gout, Neuropathy Pellegrino, Latasha M ([id number]) [id number] Page 14 of 15 Photos Wound Measurements Length: (cm) 1.7 Width: (cm) 1 Depth: (cm) 0.1 Area: (cm) 1.335 Volume: (cm) 0.134 % Reduction in Area: % Reduction in Volume: Epithelialization: Small (1-33%) Tunneling: No Undermining: No Wound Description Classification: Full Thickness Without Exposed Support Structures Wound Margin: Flat and Intact Exudate Amount: Medium Exudate Type: Serous Exudate Color: amber Foul Odor After Cleansing: No Slough/Fibrino Yes Wound Bed Granulation Amount: Large (67-100%) Exposed Structure Granulation Quality: Red Fascia Exposed: No Necrotic Amount: Small (1-33%) Fat Layer (Subcutaneous Tissue) Exposed: Yes Necrotic Quality: Adherent Slough Tendon Exposed: No Muscle Exposed: No Joint Exposed: No Bone Exposed: No Periwound Skin  Texture Texture Color No Abnormalities Noted: Yes No Abnormalities Noted: No Hemosiderin Staining: Yes Moisture No Abnormalities Noted: No Temperature / Pain Dry / Scaly: Yes Temperature: No Abnormality Treatment Notes Wound #19 (Lower Leg) Wound Laterality: Right, Medial Cleanser Peri-Wound Care Sween Lotion (Moisturizing lotion) Discharge Instruction: Apply moisturizing lotion as directed Topical Primary Dressing KerraCel Ag Gelling Fiber Dressing, 4x5 in (silver alginate) Discharge Instruction: Apply silver alginate to wound bed as instructed Secondary Dressing ABD Pad, 8x10 Discharge Instruction: Apply over primary dressing as directed. Secured With Compression Wrap Kerlix Roll 4.5x3.1 (in/yd) Discharge Instruction: Apply Kerlix and Coban compression as directed. Coban Self-Adherent Wrap 4x5 (in/yd) Discharge Instruction: Apply over Kerlix as directed. Compression Stockings [place], Levert M ([id number]) [id number] Page 15 of 15 Add-Ons Electronic Signature(s) Signed: 07/08/2022 3:22:45 PM By: Worthy Rancher Signed: 07/11/2022 12:51:09 PM By: Baruch Gouty RN, BSN Entered By: Worthy Rancher on 07/08/2022 14:35:32 -------------------------------------------------------------------------------- Vitals Details Patient Name: Date of Service: [name], [name] 07/08/2022 2:00 PM Medical Record Number: [id number] Patient Account Number: [id number] Date  of Birth/Sex: Treating RN: [birth year] (86 y.o. [name] Primary Care Jorge Retz: [name] Other Clinician: Referring Raynisha Avilla: Treating Jauna Raczynski/Extender: Celine Ahr in Treatment: 12 Vital Signs Time Taken: 14:13 Temperature (F): 97.7 Height (in): 69 Pulse (bpm): 83 Weight (lbs): 175 Respiratory Rate (breaths/min): 18 Body Mass Index (BMI): 25.8 Blood Pressure (mmHg): 122/69 Capillary Blood Glucose (mg/dl): 95 Reference Range: 80 - 120 mg /  dl Notes glucose per pt report this am Electronic Signature(s) Signed: 07/11/2022 12:51:09 PM By: Baruch Gouty RN, BSN Entered By: Baruch Gouty on 07/08/2022 14:13:45

## 2022-07-11 NOTE — Progress Notes (Signed)
Vincent Vincent [name] ([id number]) [id number] Page 1 of 15 Visit Report for 07/08/2022 Chief Complaint Document Details Patient Name: Date of Service: Vincent Vincent Vincent [name] ID Vincent [name]. 07/08/2022 2:00 PM Medical Record Number: [id number] Patient Account Number: [id number] Date of Birth/Sex: Treating RN: August 06, 1936 (86 y.o. Vincent [name]) Primary Care Provider: [name] Other Clinician: Referring Provider: Treating Provider/Extender: Celine Ahr in Treatment: 12 Information Obtained from: Patient Chief Complaint 07/15/2021 patient is here for wounds on his bilateral lower legs 04/15/2022: The patient returns to clinic with new bilateral leg wounds. 05/24/2022: Back with new bilateral leg wounds Electronic Signature(s) Signed: 07/08/2022 3:05:09 PM By: Fredirick Maudlin MD FACS Entered By: Fredirick Maudlin on 07/08/2022 15:05:09 -------------------------------------------------------------------------------- Debridement Details Patient Name: Date of Service: Vincent Vincent [name], Vincent Vincent [name] ID Vincent [name]. 07/08/2022 2:00 PM Medical Record Number: [id number] Patient Account Number: [id number] Date of Birth/Sex: Treating RN: Jan 24, 1936 (86 y.o. Vincent Vincent [name] Primary Care Provider: [name] Other Clinician: Referring Provider: Treating Provider/Extender: Celine Ahr in Treatment: 12 Debridement Performed for Assessment: Wound #14 Right,Circumferential Lower Leg Performed By: Physician Fredirick Maudlin, MD Debridement Type: Debridement Severity of Tissue Pre Debridement: Fat layer exposed Level of Consciousness (Pre-procedure): Awake and Alert Pre-procedure Verification/Time Out Yes - 14:40 Taken: Start Time: 14:42 Pain Control: Lidocaine 4% T opical Solution T Area Debrided (L x W): otal 3 (cm) x 2.2 (cm) = 6.6 (cm) Tissue and other material debrided: Non-Viable, Slough, Slough Level: Non-Viable Tissue Debridement Description: Selective/Open  Wound Instrument: Curette Bleeding: Minimum Hemostasis Achieved: Pressure Procedural Pain: 0 Post Procedural Pain: 0 Response to Treatment: Procedure was tolerated well Level of Consciousness (Post- Awake and Alert procedure): Post Debridement Measurements of Total Wound Length: (cm) 5.3 Width: (cm) 2.2 Depth: (cm) 0.1 Volume: (cm) 0.916 Vincent Vincent [name], Vincent Vincent [name] ([id number]) [id number] Page 2 of 15 Character of Wound/Ulcer Post Debridement: Improved Severity of Tissue Post Debridement: Fat layer exposed Post Procedure Diagnosis Same as Pre-procedure Notes scribed by Baruch Gouty, RN for Dr. Celine Ahr Electronic Signature(s) Signed: 07/08/2022 4:34:03 PM By: Fredirick Maudlin MD FACS Signed: 07/11/2022 12:51:09 PM By: Baruch Gouty RN, BSN Entered By: Baruch Gouty on 07/08/2022 14:47:10 -------------------------------------------------------------------------------- Debridement Details Patient Name: Date of Service: Vincent Vincent [name], Vincent Vincent [name] ID Vincent [name]. 07/08/2022 2:00 PM Medical Record Number: [id number] Patient Account Number: [id number] Date of Birth/Sex: Treating RN: [birth year] (86 y.o. Vincent Vincent [name] Primary Care Provider: [name] Other Clinician: Referring Provider: Treating Provider/Extender: Celine Ahr in Treatment: 12 Debridement Performed for Assessment: Wound #16 Left,Lateral Lower Leg Performed By: Physician Fredirick Maudlin, MD Debridement Type: Debridement Severity of Tissue Pre Debridement: Fat layer exposed Level of Consciousness (Pre-procedure): Awake and Alert Pre-procedure Verification/Time Out Yes - 14:40 Taken: Start Time: 14:42 Pain Control: Lidocaine 4% T opical Solution T Area Debrided (L x W): otal 2.1 (cm) x 3.9 (cm) = 8.19 (cm) Tissue and other material debrided: Non-Viable, Slough, Slough Level: Non-Viable Tissue Debridement Description: Selective/Open Wound Instrument: Curette Bleeding:  Minimum Hemostasis Achieved: Pressure Procedural Pain: 0 Post Procedural Pain: 0 Response to Treatment: Procedure was tolerated well Level of Consciousness (Post- Awake and Alert procedure): Post Debridement Measurements of Total Wound Length: (cm) 2.1 Width: (cm) 3.9 Depth: (cm) 0.1 Volume: (cm) 0.643 Character of Wound/Ulcer Post Debridement: Improved Severity of Tissue Post Debridement: Fat layer exposed Post Procedure Diagnosis Same as Pre-procedure Notes scribed by Baruch Gouty, RN for Dr. Celine Ahr Electronic Signature(s) Signed: 07/08/2022 4:34:03 PM By: Fredirick Maudlin MD FACS Signed: 07/11/2022 12:51:09 PM By: Baruch Gouty RN, BSN  Entered By: Baruch Gouty on 07/08/2022 14:47:45 Vincent Vincent [name], Vincent Vincent [name] ([id number]) [id number] Page 3 of 15 -------------------------------------------------------------------------------- Debridement Details Patient Name: Date of Service: Vincent Vincent [name], Vincent Vincent [name] ID Vincent [name]. 07/08/2022 2:00 PM Medical Record Number: [id number] Patient Account Number: [id number] Date of Birth/Sex: Treating RN: May 02, 1936 (86 y.o. Vincent Vincent [name] Primary Care Provider: [name] Other Clinician: Referring Provider: Treating Provider/Extender: Celine Ahr in Treatment: 12 Debridement Performed for Assessment: Wound #19 Right,Medial Lower Leg Performed By: Physician Fredirick Maudlin, MD Debridement Type: Debridement Severity of Tissue Pre Debridement: Fat layer exposed Level of Consciousness (Pre-procedure): Awake and Alert Pre-procedure Verification/Time Out Yes - 14:40 Taken: Start Time: 14:42 Pain Control: Lidocaine 4% T opical Solution T Area Debrided (L x W): otal 1.7 (cm) x 1 (cm) = 1.7 (cm) Tissue and other material debrided: Non-Viable, Slough, Slough Level: Non-Viable Tissue Debridement Description: Selective/Open Wound Instrument: Curette Bleeding: Minimum Hemostasis Achieved: Pressure Procedural  Pain: 0 Post Procedural Pain: 0 Response to Treatment: Procedure was tolerated well Level of Consciousness (Post- Awake and Alert procedure): Post Debridement Measurements of Total Wound Length: (cm) 1.7 Width: (cm) 1 Depth: (cm) 0.1 Volume: (cm) 0.134 Character of Wound/Ulcer Post Debridement: Improved Severity of Tissue Post Debridement: Fat layer exposed Post Procedure Diagnosis Same as Pre-procedure Notes scribed by Baruch Gouty, RN for Dr. Celine Ahr Electronic Signature(s) Signed: 07/08/2022 4:34:03 PM By: Fredirick Maudlin MD FACS Signed: 07/11/2022 12:51:09 PM By: Baruch Gouty RN, BSN Entered By: Baruch Gouty on 07/08/2022 14:48:58 -------------------------------------------------------------------------------- Debridement Details Patient Name: Date of Service: Vincent Vincent [name], Vincent Vincent [name] ID Vincent [name]. 07/08/2022 2:00 PM Medical Record Number: [id number] Patient Account Number: [id number] Date of Birth/Sex: Treating RN: 06-18-1936 (86 y.o. Vincent Vincent [name] Primary Care Provider: [name] Other Clinician: Referring Provider: Treating Provider/Extender: Celine Ahr in Treatment: 12 Debridement Performed for Assessment: Wound #18 Right,Lateral Lower Leg Performed By: Physician Fredirick Maudlin, MD Debridement Type: Debridement Severity of Tissue Pre Debridement: Fat layer exposed Vincent Vincent [name], Vincent Vincent [name] ([id number]) [id number] Page 4 of 15 Level of Consciousness (Pre-procedure): Awake and Alert Pre-procedure Verification/Time Out Yes - 14:40 Taken: Start Time: 14:42 Pain Control: Lidocaine 4% T opical Solution T Area Debrided (L x W): otal 0.9 (cm) x 2.3 (cm) = 2.07 (cm) Tissue and other material debrided: Non-Viable, Slough, Slough Level: Non-Viable Tissue Debridement Description: Selective/Open Wound Instrument: Curette Bleeding: Minimum Hemostasis Achieved: Pressure Procedural Pain: 0 Post Procedural Pain: 0 Response to  Treatment: Procedure was tolerated well Level of Consciousness (Post- Awake and Alert procedure): Post Debridement Measurements of Total Wound Length: (cm) 0.9 Width: (cm) 2.3 Depth: (cm) 0.1 Volume: (cm) 0.163 Character of Wound/Ulcer Post Debridement: Improved Severity of Tissue Post Debridement: Fat layer exposed Post Procedure Diagnosis Same as Pre-procedure Notes scribed by Baruch Gouty, RN for Dr. Celine Ahr Electronic Signature(s) Signed: 07/08/2022 4:34:03 PM By: Fredirick Maudlin MD FACS Signed: 07/11/2022 12:51:09 PM By: Baruch Gouty RN, BSN Entered By: Baruch Gouty on 07/08/2022 14:49:11 -------------------------------------------------------------------------------- HPI Details Patient Name: Date of Service: Vincent Vincent [name], Vincent Vincent [name] ID Vincent [name]. 07/08/2022 2:00 PM Medical Record Number: [id number] Patient Account Number: [id number] Date of Birth/Sex: Treating RN: Jun 22, 1936 (86 y.o. Vincent [name]) Primary Care Provider: [name] Other Clinician: Referring Provider: Treating Provider/Extender: Celine Ahr in Treatment: 12 History of Present Illness HPI Description: ADMISSION 07/15/2021 This is an 86 year old man who came here for evaluation of wounds on his bilateral predominantly anterior lower legs mid aspect of the tibia. He seems to have been dealing with this for most of this  year. Currently with 3 wounds on the right and 2 on the left. At least some of these seem to start off as blisters probably the majority. He has been following with Dr. Anabel Bene of [facility] dermatology according the patient back he has an appointment Dr. Anabel Bene tomorrow he was given a prescription for triamcinolone which she has been applying daily in fact it was renewed yesterday. Patient is a diabetic no recent hemoglobin A1c. He is on Coumadin for persistent atrial fibrillation. Vein and vascular in December 2021 at which time he had bilateral foot pain and finger pain. ABIs  bilaterally were noncompressible however the TBI on the right was only 0.39. There were biphasic waveforms on the left his ABI was noncompressible with biphasic waveforms but with a TBI of 0.6. He was not felt to have a primary vascular issue. There is a Vincent [name] of Raynaud's phenomenon as well. Past medical history includes cellulitis of the right leg in June 2022, type 2 diabetes, persistent lower extremity edema, bilateral foot and ankle pain, Raynaud's phenomenon, coronary artery disease, congestive heart failure, pacemaker, none sustained ventricular tachycardia, remote CVA paroxysmal atrial fib on Coumadin. We did not reattempt ABIs in our clinic 07/22/2019 patient appears to be doing decently well in regard to his wounds as compared to last week with the wounds that were present last week. Fortunately I do not see any signs of active infection which is great news. No fevers, chills, nausea, vomiting, or diarrhea. With that being said he has several new blisters Mincer, Cavin Vincent [name] ([id number]) [id number] Page 5 of 15 that are getting need to be addressed today. 07/28/2021 upon evaluation today patient actually appears to be making excellent progress. I am extremely pleased with where we stand today. I think that his wounds are significantly improved compared to where they were previous. 08/04/2021 upon evaluation today patient appears to be doing much better in regard to his wounds in general. I feel like across the board he is showing signs of significant improvement. Fortunately there is no evidence of infection which is great news. No fevers, chills, nausea, vomiting, or diarrhea. 08/11/2021 upon evaluation today patient actually appears to be doing quite well in regard to his wound. Has been tolerating the dressing changes without complication. Fortunately I do not see any signs of infection currently which is great news as well. No fevers, chills, nausea, vomiting,  or diarrhea. 08/18/2021 on evaluation today patient appears to be doing excellent he is making good progress and overall I am extremely pleased with where we stand today. There does not appear to be any signs of active infection at this time which is great news. 09/01/2020 upon evaluation patient's wounds on both the left and the right appear to be at 0.1 cm measurement and are very close to complete resolution. Were not quite completely done yet but this is very close. Overall I am extremely happy with where things stand and I think he is headed in the right direction. 09/08/2021 upon evaluation today patient appears to be doing well in regard to his right leg which is showing signs of being completely healed which is excellent. Fortunately I do not see any evidence of active infection locally nor systemically at this point which is also great news. No fevers, chills, nausea, vomiting, or diarrhea. 09/15/2021; this is a patient we have been following for now a small wound on the left dorsal foot. He had wounds on the right legs and was discharged to 20/30 below-knee stockings apparently  in late December. He came in today with marked increase in swelling in the right leg large areas of denuded skin which probably started as blisters. He did not have a stocking on 10/06/2021 upon evaluation today patient appears to be doing decently well in regard to his wounds. On the left leg he is completely healed on the right leg he does still have a small area that is open although again its not as good as it was last time I saw him is also not as bad as it was in the beginning. He has been in the hospital and they did not wrap him as part of the issue I believe here as well. 10/20/2021 upon evaluation today patient appears to be doing well with regard to his leg ulcer. He has been tolerating the dressing changes without complication. Fortunately I do not see any signs of active infection locally or systemically at this  point. No fevers, chills, nausea, vomiting, or diarrhea. 10/27/2021 upon evaluation today patient appears to be doing well with regard to his wound. In fact this appears to be almost completely healed which is great news. Fortunately I do not see any evidence of active infection locally nor systemically which is great news. No fevers, chills, nausea, vomiting, or diarrhea. 11/03/2021 upon evaluation today patient is very close to complete resolution he just has a very tiny area on the right leg still open at this point. He did go get socks from [employer] as I discussed with him at the last visit unfortunately he actually got diabetic socks and compression socks. I explained those are not the same thing and he voiced understanding he is going to go to a different [facility] and try to find exactly what we are looking for. Fortunately I do not see any signs of infection and I think he is very close to discharge will probably be ready for next week. 11/10/2021: T oday the patient's right lower extremity wound is closed. He was able to get the low grade compression socks from [employer] and is wearing 1 on the left leg. READMISSION 04/15/2022 The patient returns to clinic today with new bilateral lower extremity leg wounds. He says that he has not been using moisturizer regularly and is not wearing his compression stockings. The wounds are superficial. He has eschar buildup on the surfaces and thickened dry, cracked skin. Under the eschar, there is a little bit of slough on the wound surfaces. 04/25/2022: All of the wounds are little bit smaller with just a little eschar on their surfaces. The right anterior tibial wound is closed. 05/04/2022: All of his wounds are healed. 05/24/2022: The patient returns today with extensive bilateral lower leg wounds. On the right, the wounds are essentially circumferential; on the left, they are clustered in 3 separate areas. There is slough accumulation, along with some eschar. He  reports that his legs reopened shortly after his discharge from the clinic a couple of weeks ago. He says that he was unable to apply his compression stockings. He subsequently purchased a stocking donner but the compression socks that he has are only 8 to 15 mmHg compression. 06/01/2022: There has been dramatic improvement in his wounds over the week. There has been substantial epithelialization. He still has circumferential wounds, but the open areas are smaller. There is a little slough and eschar accumulation on all the surfaces. Edema control is good. 07/08/2022: Shortly after our last visit, the patient was admitted to the hospital with dizziness and a CHF  exacerbation. His wounds look pretty good this week. They are small with light slough on the surface. Edema control is suboptimal. Electronic Signature(s) Signed: 07/08/2022 3:06:15 PM By: Fredirick Maudlin MD FACS Entered By: Fredirick Maudlin on 07/08/2022 15:06:15 -------------------------------------------------------------------------------- Physical Exam Details Patient Name: Date of Service: Vincent Vincent [name], Vincent Vincent [name] ID Vincent [name]. 07/08/2022 2:00 PM Medical Record Number: [id number] Patient Account Number: [id number] Date of Birth/Sex: Treating RN: April 10, 1936 (86 y.o. Vincent [name]) Primary Care Provider: [name] Other Clinician: Referring Provider: Treating Provider/Extender: Celine Ahr in Treatment: [address] [place] Vincent [name] ([id number]) [id number] Page 6 of 15 Constitutional . . . . No acute distress.Marland Kitchen Respiratory Normal work of breathing on room air.. Notes 07/08/2022: His wounds look pretty good this week. They are small with light slough on the surface. Edema control is suboptimal. Electronic Signature(s) Signed: 07/08/2022 3:07:30 PM By: Fredirick Maudlin MD FACS Entered By: Fredirick Maudlin on 07/08/2022  15:07:30 -------------------------------------------------------------------------------- Physician Orders Details Patient Name: Date of Service: Vincent Vincent [name], Vincent Vincent [name] ID Vincent [name]. 07/08/2022 2:00 PM Medical Record Number: [id number] Patient Account Number: [id number] Date of Birth/Sex: Treating RN: 08/31/1935 (86 y.o. Vincent Vincent [name] Primary Care Provider: [name] Other Clinician: Referring Provider: Treating Provider/Extender: Celine Ahr in Treatment: 12 Verbal / Phone Orders: No Diagnosis Coding ICD-10 Coding Code Description 2094702252 Non-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 leg limited to breakdown of skin L97.811 Non-pressure chronic ulcer of other part of right lower leg limited to breakdown of skin I87.2 Venous insufficiency (chronic) (peripheral) I50.9 Heart failure, unspecified E11.622 Type 2 diabetes mellitus with other skin ulcer Follow-up Appointments ppointment in 1 week. - Dr. Celine Ahr RM 1 Return A Bathing/ Shower/ Hygiene May shower with protection but do not get wound dressing(s) wet. Edema Control - Lymphedema / SCD / Other Elevate legs to the level of the heart or above for 30 minutes daily and/or when sitting, a frequency of: - whenever sitting throughout the day Avoid standing for long periods of time. Exercise regularly [place] wound care orders this week; continue Home Health for wound care. May utilize formulary equivalent dressing for wound treatment orders unless otherwise specified. - dressing changes 2 times per week, once by [facility], once at wound center Other [place] Orders/Instructions: - [facility] Wound Treatment Wound #14 - Lower Leg Wound Laterality: Right, Circumferential Peri-Wound Care: Sween Lotion (Moisturizing lotion) 2 x Per Week Discharge Instructions: Apply moisturizing lotion as directed Prim Dressing: KerraCel Ag Gelling Fiber Dressing, 4x5 in (silver alginate) 2 x Per Week ary Discharge  Instructions: Apply silver alginate to wound bed as instructed Secondary Dressing: ABD Pad, 8x10 2 x Per Week Discharge Instructions: Apply over primary dressing as directed. Compression Wrap: Kerlix Roll 4.5x3.1 (in/yd) 2 x Per Week Discharge Instructions: Apply Kerlix and Coban compression as directed. Compression Wrap: Coban Self-Adherent Wrap 4x5 (in/yd) 2 x Per Week Discharge Instructions: Apply over Kerlix as directed. Vincent Vincent [name] ([id number]) [id number] Page 7 of 15 Wound #16 - Lower Leg Wound Laterality: Left, Lateral Peri-Wound Care: Sween Lotion (Moisturizing lotion) 2 x Per Week Discharge Instructions: Apply moisturizing lotion as directed Prim Dressing: KerraCel Ag Gelling Fiber Dressing, 4x5 in (silver alginate) 2 x Per Week ary Discharge Instructions: Apply silver alginate to wound bed as instructed Secondary Dressing: ABD Pad, 8x10 2 x Per Week Discharge Instructions: Apply over primary dressing as directed. Compression Wrap: Kerlix Roll 4.5x3.1 (in/yd) 2 x Per Week Discharge Instructions: Apply Kerlix and Coban compression as directed. Compression Wrap:  Coban Self-Adherent Wrap 4x5 (in/yd) 2 x Per Week Discharge Instructions: Apply over Kerlix as directed. Wound #18 - Lower Leg Wound Laterality: Right, Lateral Peri-Wound Care: Sween Lotion (Moisturizing lotion) 2 x Per Week Discharge Instructions: Apply moisturizing lotion as directed Prim Dressing: KerraCel Ag Gelling Fiber Dressing, 4x5 in (silver alginate) 2 x Per Week ary Discharge Instructions: Apply silver alginate to wound bed as instructed Secondary Dressing: ABD Pad, 8x10 2 x Per Week Discharge Instructions: Apply over primary dressing as directed. Compression Wrap: Kerlix Roll 4.5x3.1 (in/yd) 2 x Per Week Discharge Instructions: Apply Kerlix and Coban compression as directed. Compression Wrap: Coban Self-Adherent Wrap 4x5 (in/yd) 2 x Per Week Discharge Instructions: Apply over  Kerlix as directed. Wound #19 - Lower Leg Wound Laterality: Right, Medial Peri-Wound Care: Sween Lotion (Moisturizing lotion) 2 x Per Week Discharge Instructions: Apply moisturizing lotion as directed Prim Dressing: KerraCel Ag Gelling Fiber Dressing, 4x5 in (silver alginate) 2 x Per Week ary Discharge Instructions: Apply silver alginate to wound bed as instructed Secondary Dressing: ABD Pad, 8x10 2 x Per Week Discharge Instructions: Apply over primary dressing as directed. Compression Wrap: Kerlix Roll 4.5x3.1 (in/yd) 2 x Per Week Discharge Instructions: Apply Kerlix and Coban compression as directed. Compression Wrap: Coban Self-Adherent Wrap 4x5 (in/yd) 2 x Per Week Discharge Instructions: Apply over Kerlix as directed. Patient Medications llergies: No Known Allergies A Notifications Medication Indication Start End prior to debridement 07/08/2022 lidocaine DOSE topical 4 % cream - cream topical Electronic Signature(s) Signed: 07/08/2022 4:34:03 PM By: Fredirick Maudlin MD FACS Entered By: Fredirick Maudlin on 07/08/2022 15:07:51 -------------------------------------------------------------------------------- Problem List Details Patient Name: Date of Service: Vincent Vincent [name], Vincent Vincent [name] ID Vincent [name]. 07/08/2022 2:00 PM Medical Record Number: [id number] Patient Account Number: [id number] Date of Birth/Sex: Treating RN: Oct 18, 1935 (86 y.o. Ulyses Amor, [address] [place] Vincent [name] ([id number]) [id number] Page 8 of 15 Primary Care Provider: [name] Other Clinician: Referring Provider: Treating Provider/Extender: Celine Ahr in Treatment: 12 Active Problems ICD-10 Encounter Code Description Active Date MDM Diagnosis L97.821 Non-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 leg limited to breakdown 04/15/2022 No Yes of skin L97.811 Non-pressure chronic ulcer of other part of right lower leg limited to breakdown 04/15/2022 No Yes of skin I87.2  Venous insufficiency (chronic) (peripheral) 04/15/2022 No Yes I50.9 Heart failure, unspecified 04/15/2022 No Yes E11.622 Type 2 diabetes mellitus with other skin ulcer 04/15/2022 No Yes Inactive Problems Resolved Problems Electronic Signature(s) Signed: 07/08/2022 3:03:42 PM By: Fredirick Maudlin MD FACS Entered By: Fredirick Maudlin on 07/08/2022 15:03:42 -------------------------------------------------------------------------------- Progress Note Details Patient Name: Date of Service: Vincent Vincent [name], Vincent Vincent [name] ID Vincent [name]. 07/08/2022 2:00 PM Medical Record Number: [id number] Patient Account Number: [id number] Date of Birth/Sex: Treating RN: 07/09/1936 (86 y.o. Vincent [name]) Primary Care Provider: [name] Other Clinician: Referring Provider: Treating Provider/Extender: Celine Ahr in Treatment: 12 Subjective Chief Complaint Information obtained from Patient 07/15/2021 patient is here for wounds on his bilateral lower legs 04/15/2022: The patient returns to clinic with new bilateral leg wounds. 05/24/2022: Back with new bilateral leg wounds History of Present Illness (HPI) ADMISSION 07/15/2021 This is an 86 year old man who came here for evaluation of wounds on his bilateral predominantly anterior lower legs mid aspect of the tibia. He seems to have been dealing with this for most of this year. Currently with 3 wounds on the right and 2 on the left. At least some of these seem to start off as blisters probably the majority. He has been following with  Dr. Anabel Bene of [facility] dermatology according the patient back he has an appointment Dr. Anabel Bene tomorrow he was given a prescription for triamcinolone which she has been applying daily in fact it was renewed yesterday. Patient is a diabetic no recent hemoglobin A1c. He is on Coumadin for persistent atrial fibrillation. Vein and vascular in December 2021 at which time he had bilateral foot pain and finger pain. ABIs bilaterally were  noncompressible however the TBI on the right was only 0.39. There were biphasic waveforms on the left his ABI was noncompressible with biphasic waveforms but with a TBI of 0.6. He was not felt to have Yacoub, Creed Vincent [name] ([id number]) [id number] Page 9 of 15 a primary vascular issue. There is a Vincent [name] of Raynaud's phenomenon as well. Past medical history includes cellulitis of the right leg in June 2022, type 2 diabetes, persistent lower extremity edema, bilateral foot and ankle pain, Raynaud's phenomenon, coronary artery disease, congestive heart failure, pacemaker, none sustained ventricular tachycardia, remote CVA paroxysmal atrial fib on Coumadin. We did not reattempt ABIs in our clinic 07/22/2019 patient appears to be doing decently well in regard to his wounds as compared to last week with the wounds that were present last week. Fortunately I do not see any signs of active infection which is great news. No fevers, chills, nausea, vomiting, or diarrhea. With that being said he has several new blisters that are getting need to be addressed today. 07/28/2021 upon evaluation today patient actually appears to be making excellent progress. I am extremely pleased with where we stand today. I think that his wounds are significantly improved compared to where they were previous. 08/04/2021 upon evaluation today patient appears to be doing much better in regard to his wounds in general. I feel like across the board he is showing signs of significant improvement. Fortunately there is no evidence of infection which is great news. No fevers, chills, nausea, vomiting, or diarrhea. 08/11/2021 upon evaluation today patient actually appears to be doing quite well in regard to his wound. Has been tolerating the dressing changes without complication. Fortunately I do not see any signs of infection currently which is great news as well. No fevers, chills, nausea, vomiting, or  diarrhea. 08/18/2021 on evaluation today patient appears to be doing excellent he is making good progress and overall I am extremely pleased with where we stand today. There does not appear to be any signs of active infection at this time which is great news. 09/01/2020 upon evaluation patient's wounds on both the left and the right appear to be at 0.1 cm measurement and are very close to complete resolution. Were not quite completely done yet but this is very close. Overall I am extremely happy with where things stand and I think he is headed in the right direction. 09/08/2021 upon evaluation today patient appears to be doing well in regard to his right leg which is showing signs of being completely healed which is excellent. Fortunately I do not see any evidence of active infection locally nor systemically at this point which is also great news. No fevers, chills, nausea, vomiting, or diarrhea. 09/15/2021; this is a patient we have been following for now a small wound on the left dorsal foot. He had wounds on the right legs and was discharged to 20/30 below-knee stockings apparently in late December. He came in today with marked increase in swelling in the right leg large areas of denuded skin which probably started as blisters. He did not have a stocking  on 10/06/2021 upon evaluation today patient appears to be doing decently well in regard to his wounds. On the left leg he is completely healed on the right leg he does still have a small area that is open although again its not as good as it was last time I saw him is also not as bad as it was in the beginning. He has been in the hospital and they did not wrap him as part of the issue I believe here as well. 10/20/2021 upon evaluation today patient appears to be doing well with regard to his leg ulcer. He has been tolerating the dressing changes without complication. Fortunately I do not see any signs of active infection locally or systemically at this  point. No fevers, chills, nausea, vomiting, or diarrhea. 10/27/2021 upon evaluation today patient appears to be doing well with regard to his wound. In fact this appears to be almost completely healed which is great news. Fortunately I do not see any evidence of active infection locally nor systemically which is great news. No fevers, chills, nausea, vomiting, or diarrhea. 11/03/2021 upon evaluation today patient is very close to complete resolution he just has a very tiny area on the right leg still open at this point. He did go get socks from [place] as I discussed with him at the last visit unfortunately he actually got diabetic socks and compression socks. I explained those are not the same thing and he voiced understanding he is going to go to a different [facility] and try to find exactly what we are looking for. Fortunately I do not see any signs of infection and I think he is very close to discharge will probably be ready for next week. 11/10/2021: T oday the patient's right lower extremity wound is closed. He was able to get the low grade compression socks from [employer] and is wearing 1 on the left leg. READMISSION 04/15/2022 The patient returns to clinic today with new bilateral lower extremity leg wounds. He says that he has not been using moisturizer regularly and is not wearing his compression stockings. The wounds are superficial. He has eschar buildup on the surfaces and thickened dry, cracked skin. Under the eschar, there is a little bit of slough on the wound surfaces. 04/25/2022: All of the wounds are little bit smaller with just a little eschar on their surfaces. The right anterior tibial wound is closed. 05/04/2022: All of his wounds are healed. 05/24/2022: The patient returns today with extensive bilateral lower leg wounds. On the right, the wounds are essentially circumferential; on the left, they are clustered in 3 separate areas. There is slough accumulation, along with some eschar. He  reports that his legs reopened shortly after his discharge from the clinic a couple of weeks ago. He says that he was unable to apply his compression stockings. He subsequently purchased a stocking donner but the compression socks that he has are only 8 to 15 mmHg compression. 06/01/2022: There has been dramatic improvement in his wounds over the week. There has been substantial epithelialization. He still has circumferential wounds, but the open areas are smaller. There is a little slough and eschar accumulation on all the surfaces. Edema control is good. 07/08/2022: Shortly after our last visit, the patient was admitted to the hospital with dizziness and a CHF exacerbation. His wounds look pretty good this week. They are small with light slough on the surface. Edema control is suboptimal. Patient History Information obtained from Patient, Chart. Family History Cancer -  Mother,Siblings, Heart Disease - Father, Hypertension - Mother,Father, Thyroid Problems - Siblings, No family history of Diabetes, Hereditary Spherocytosis, Kidney Disease, Lung Disease, Seizures, Stroke, Tuberculosis. Social History Former smoker, Marital Status - Divorced, Alcohol Use - Never, Drug Use - No History, Caffeine Use - Rarely - coffee. Medical History Eyes Patient has history of Cataracts - left eye, Glaucoma Denies history of Optic Neuritis Respiratory Patient has history of Chronic Obstructive Pulmonary Disease (COPD) Cardiovascular Vincent Vincent [name], Vincent Vincent [name] ([id number]) [id number] Page 10 of 15 Patient has history of Arrhythmia - afib, Congestive Heart Failure, Coronary Artery Disease, Hypertension, Myocardial Infarction Endocrine Patient has history of Type II Diabetes Denies history of Type I Diabetes Genitourinary Denies history of End Stage Renal Disease Integumentary (Skin) Denies history of History of Burn Musculoskeletal Patient has history of Gout Neurologic Patient has  history of Neuropathy Oncologic Denies history of Received Chemotherapy, Received Radiation Psychiatric Denies history of Anorexia/bulimia, Confinement Anxiety Hospitalization/Surgery History - cardiac cath with stents. - colonoscopy. - knee surgery. Medical A Surgical History Notes nd Respiratory pulmonary eosinophilia Cardiovascular hyperlipidemia, alcoholic cardiomyopathy Gastrointestinal GERD Genitourinary incontinence, enlarged prostate Neurologic left foot drop, stroke Objective Constitutional No acute distress.. Vitals Time Taken: 2:13 PM, Height: 69 in, Weight: 175 lbs, BMI: 25.8, Temperature: 97.7 F, Pulse: 83 bpm, Respiratory Rate: 18 breaths/min, Blood Pressure: 122/69 mmHg, Capillary Blood Glucose: 95 mg/dl. General Notes: glucose per pt report this am Respiratory Normal work of breathing on room air.. General Notes: 07/08/2022: His wounds look pretty good this week. They are small with light slough on the surface. Edema control is suboptimal. Integumentary (Hair, Skin) Wound #14 status is Open. Original cause of wound was Blister. The date acquired was: 05/09/2022. The wound has been in treatment 6 weeks. The wound is located on the Right,Circumferential Lower Leg. The wound measures 5.3cm length x 2.2cm width x 0.1cm depth; 9.158cm^2 area and 0.916cm^3 volume. There is Fat Layer (Subcutaneous Tissue) exposed. There is no tunneling or undermining noted. There is a medium amount of serous drainage noted. The wound margin is flat and intact. There is medium (34-66%) red granulation within the wound bed. There is a medium (34-66%) amount of necrotic tissue within the wound bed including Adherent Slough. The periwound skin appearance had no abnormalities noted for texture. The periwound skin appearance exhibited: Dry/Scaly, Hemosiderin Staining. The periwound skin appearance did not exhibit: Maceration. Periwound temperature was noted as No Abnormality. Wound #15 status is  Open. Original cause of wound was Blister. The date acquired was: 05/09/2022. The wound has been in treatment 6 weeks. The wound is located on the Left,Anterior Lower Leg. The wound measures 0cm length x 0cm width x 0cm depth; 0cm^2 area and 0cm^3 volume. There is no tunneling or undermining noted. There is a none present amount of drainage noted. There is no granulation within the wound bed. There is no necrotic tissue within the wound bed. The periwound skin appearance had no abnormalities noted for texture. The periwound skin appearance had no abnormalities noted for moisture. The periwound skin appearance exhibited: Hemosiderin Staining. Periwound temperature was noted as No Abnormality. Wound #16 status is Open. Original cause of wound was Blister. The date acquired was: 05/09/2022. The wound has been in treatment 6 weeks. The wound is located on the Left,Lateral Lower Leg. The wound measures 2.1cm length x 3.9cm width x 0.1cm depth; 6.432cm^2 area and 0.643cm^3 volume. There is Fat Layer (Subcutaneous Tissue) exposed. There is no tunneling or undermining noted. There is a medium amount of serous  drainage noted. The wound margin is flat and intact. There is large (67-100%) red granulation within the wound bed. There is no necrotic tissue within the wound bed. The periwound skin appearance had no abnormalities noted for texture. The periwound skin appearance had no abnormalities noted for moisture. The periwound skin appearance exhibited: Hemosiderin Staining. Periwound temperature was noted as No Abnormality. Wound #17 status is Open. Original cause of wound was Blister. The date acquired was: 05/09/2022. The wound has been in treatment 6 weeks. The wound is located on the Left,Medial Lower Leg. The wound measures 0cm length x 0cm width x 0cm depth; 0cm^2 area and 0cm^3 volume. There is no tunneling or undermining noted. There is a none present amount of drainage noted. There is no granulation within  the wound bed. There is no necrotic tissue within the wound bed. The periwound skin appearance had no abnormalities noted for texture. The periwound skin appearance had no abnormalities noted for moisture. The periwound skin appearance exhibited: Hemosiderin Staining. Periwound temperature was noted as No Abnormality. Wound #18 status is Open. Original cause of wound was Gradually Appeared. The date acquired was: 07/08/2022. The wound is located on the Right,Lateral Lower Leg. The wound measures 0.9cm length x 2.3cm width x 0.1cm depth; 1.626cm^2 area and 0.163cm^3 volume. There is Fat Layer (Subcutaneous Tissue) exposed. There is no tunneling or undermining noted. There is a medium amount of serous drainage noted. The wound margin is flat and intact. There is large (67-100%) red granulation within the wound bed. There is a small (1-33%) amount of necrotic tissue within the wound bed including Adherent Slough. The periwound skin appearance had no abnormalities noted for texture. The periwound skin appearance exhibited: Dry/Scaly, Hemosiderin Staining. The periwound skin appearance did not exhibit: Maceration. Periwound temperature was noted as No Abnormality. Vincent Vincent [name] ([id number]) [id number] Page 11 of 15 Wound #19 status is Open. Original cause of wound was Gradually Appeared. The date acquired was: 07/08/2022. The wound is located on the Right,Medial Lower Leg. The wound measures 1.7cm length x 1cm width x 0.1cm depth; 1.335cm^2 area and 0.134cm^3 volume. There is Fat Layer (Subcutaneous Tissue) exposed. There is no tunneling or undermining noted. There is a medium amount of serous drainage noted. The wound margin is flat and intact. There is large (67-100%) red granulation within the wound bed. There is a small (1-33%) amount of necrotic tissue within the wound bed including Adherent Slough. The periwound skin appearance had no abnormalities noted for texture. The  periwound skin appearance exhibited: Dry/Scaly, Hemosiderin Staining. Periwound temperature was noted as No Abnormality. 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 There was a Selective/Open Wound Non-Viable Tissue Debridement with a total area of 6.6 sq cm performed by Fredirick Maudlin, MD. With the following instrument(s): Curette to remove Non-Viable tissue/material. Material removed includes [facility] after achieving pain control using Lidocaine 4% Topical Solution. No specimens were taken. A time out was conducted at 14:40, prior to the start of the procedure. A Minimum amount of bleeding was controlled with Pressure. The procedure was tolerated well with a pain level of  0 throughout and a pain level of 0 following the procedure. Post Debridement Measurements: 5.3cm length x 2.2cm width x 0.1cm depth; 0.916cm^3 volume. Character of Wound/Ulcer Post Debridement is improved. Severity of Tissue Post Debridement is: Fat layer exposed. Post procedure Diagnosis Wound #14: Same as Pre-Procedure General Notes: scribed by Baruch Gouty, RN for Dr. Celine Ahr. Wound #16 Pre-procedure diagnosis of Wound #16 is a Venous Leg Ulcer located on the Left,Lateral Lower Leg .Severity of Tissue Pre Debridement is: Fat layer exposed. There was a Selective/Open Wound Non-Viable Tissue Debridement with a total area of 8.19 sq cm performed by Fredirick Maudlin, MD. With the following instrument(s): Curette to remove Non-Viable tissue/material. Material  removed includes [facility] after achieving pain control using Lidocaine 4% Topical Solution. No specimens were taken. A time out was conducted at 14:40, prior to the start of the procedure. A Minimum amount of bleeding was controlled with Pressure. The procedure was tolerated well with a pain level of 0 throughout and a pain level of 0 following the procedure. Post Debridement Measurements: 2.1cm length x 3.9cm width x 0.1cm depth; 0.643cm^3 volume. Character of Wound/Ulcer Post Debridement is improved. Severity of Tissue Post Debridement is: Fat layer exposed. Post procedure Diagnosis Wound #16: Same as Pre-Procedure General Notes: scribed by Baruch Gouty, RN for Dr. Celine Ahr. Wound #18 Pre-procedure diagnosis of Wound #18 is a Lymphedema located on the Right,Lateral Lower Leg .Severity of Tissue Pre Debridement is: Fat layer exposed. There was a Selective/Open Wound Non-Viable Tissue Debridement with a total area of 2.07 sq cm performed by Fredirick Maudlin, MD. With the following instrument(s): Curette to remove Non-Viable tissue/material. Material removed includes [facility] after achieving pain control using Lidocaine 4% Topical Solution. No specimens were taken. A time out was conducted at 14:40, prior to the start of the procedure. A Minimum amount of bleeding was controlled with Pressure. The procedure was tolerated well with a pain level of 0 throughout and a pain level of 0 following the procedure. Post Debridement Measurements: 0.9cm length x 2.3cm width x 0.1cm depth; 0.163cm^3 volume. Character of Wound/Ulcer Post Debridement is improved. Severity of Tissue Post Debridement is: Fat layer exposed. Post procedure Diagnosis Wound #18: Same as Pre-Procedure General Notes: scribed by Baruch Gouty, RN for Dr. Celine Ahr. Wound #19 Pre-procedure diagnosis of Wound #19 is a Lymphedema located on the Right,Medial Lower Leg .Severity of Tissue Pre Debridement is: Fat layer exposed. There was a  Selective/Open Wound Non-Viable Tissue Debridement with a total area of 1.7 sq cm performed by Fredirick Maudlin, MD. With the following instrument(s): Curette to remove Non-Viable tissue/material. Material removed includes [facility] after achieving pain control using Lidocaine 4% Topical Solution. No specimens were taken. A time out was conducted at 14:40, prior to the start of the procedure. A Minimum amount of bleeding was controlled with Pressure. The procedure was tolerated well with a pain level of 0 throughout and a pain level of 0 following the procedure. Post Debridement Measurements: 1.7cm length x 1cm width x 0.1cm depth; 0.134cm^3 volume. Character of Wound/Ulcer Post Debridement is improved. Severity of Tissue Post Debridement is: Fat layer exposed. Post procedure Diagnosis Wound #19: Same as Pre-Procedure General Notes: scribed by Baruch Gouty, RN for Dr. Celine Ahr. Plan Follow-up Appointments: Return Appointment in 1 week. - Dr. Celine Ahr RM 1 Bathing/ Shower/ Hygiene: May shower with protection but do not get wound dressing(s) wet. Edema Control - Lymphedema / SCD / Other: Elevate legs to the level of the heart or above for 30 minutes daily  and/or when sitting, a frequency of: - whenever sitting throughout the day Avoid standing for long periods of time. Exercise regularly Home Health: New wound care orders this week; continue Home Health for wound care. May utilize formulary equivalent dressing for wound treatment orders unless Vincent Vincent [name], Vincent Vincent [name] ([id number]) [id number] Page 12 of 15 otherwise specified. - dressing changes 2 times per week, once by [facility], once at wound center Other [place] Orders/Instructions: - [facility] The following medication(s) was prescribed: lidocaine topical 4 % cream cream topical for prior to debridement was prescribed at facility WOUND #14: - Lower Leg Wound Laterality: Right, Circumferential Peri-Wound Care: Sween Lotion  (Moisturizing lotion) 2 x Per Week/ Discharge Instructions: Apply moisturizing lotion as directed Prim Dressing: KerraCel Ag Gelling Fiber Dressing, 4x5 in (silver alginate) 2 x Per Week/ ary Discharge Instructions: Apply silver alginate to wound bed as instructed Secondary Dressing: ABD Pad, 8x10 2 x Per Week/ Discharge Instructions: Apply over primary dressing as directed. Com pression Wrap: Kerlix Roll 4.5x3.1 (in/yd) 2 x Per Week/ Discharge Instructions: Apply Kerlix and Coban compression as directed. Com pression Wrap: Coban Self-Adherent Wrap 4x5 (in/yd) 2 x Per Week/ Discharge Instructions: Apply over Kerlix as directed. WOUND #16: - Lower Leg Wound Laterality: Left, Lateral Peri-Wound Care: Sween Lotion (Moisturizing lotion) 2 x Per Week/ Discharge Instructions: Apply moisturizing lotion as directed Prim Dressing: KerraCel Ag Gelling Fiber Dressing, 4x5 in (silver alginate) 2 x Per Week/ ary Discharge Instructions: Apply silver alginate to wound bed as instructed Secondary Dressing: ABD Pad, 8x10 2 x Per Week/ Discharge Instructions: Apply over primary dressing as directed. Com pression Wrap: Kerlix Roll 4.5x3.1 (in/yd) 2 x Per Week/ Discharge Instructions: Apply Kerlix and Coban compression as directed. Com pression Wrap: Coban Self-Adherent Wrap 4x5 (in/yd) 2 x Per Week/ Discharge Instructions: Apply over Kerlix as directed. WOUND #18: - Lower Leg Wound Laterality: Right, Lateral Peri-Wound Care: Sween Lotion (Moisturizing lotion) 2 x Per Week/ Discharge Instructions: Apply moisturizing lotion as directed Prim Dressing: KerraCel Ag Gelling Fiber Dressing, 4x5 in (silver alginate) 2 x Per Week/ ary Discharge Instructions: Apply silver alginate to wound bed as instructed Secondary Dressing: ABD Pad, 8x10 2 x Per Week/ Discharge Instructions: Apply over primary dressing as directed. Com pression Wrap: Kerlix Roll 4.5x3.1 (in/yd) 2 x Per Week/ Discharge Instructions: Apply  Kerlix and Coban compression as directed. Com pression Wrap: Coban Self-Adherent Wrap 4x5 (in/yd) 2 x Per Week/ Discharge Instructions: Apply over Kerlix as directed. WOUND #19: - Lower Leg Wound Laterality: Right, Medial Peri-Wound Care: Sween Lotion (Moisturizing lotion) 2 x Per Week/ Discharge Instructions: Apply moisturizing lotion as directed Prim Dressing: KerraCel Ag Gelling Fiber Dressing, 4x5 in (silver alginate) 2 x Per Week/ ary Discharge Instructions: Apply silver alginate to wound bed as instructed Secondary Dressing: ABD Pad, 8x10 2 x Per Week/ Discharge Instructions: Apply over primary dressing as directed. Com pression Wrap: Kerlix Roll 4.5x3.1 (in/yd) 2 x Per Week/ Discharge Instructions: Apply Kerlix and Coban compression as directed. Com pression Wrap: Coban Self-Adherent Wrap 4x5 (in/yd) 2 x Per Week/ Discharge Instructions: Apply over Kerlix as directed. 07/08/2022: His wounds look pretty good this week. They are small with light slough on the surface. Edema control is suboptimal. I used a curette to debride slough off of all of his wounds. We will use Kerlix and Coban wrapping over silver alginate. Follow-up in 1 week. Electronic Signature(s) Signed: 07/08/2022 3:10:10 PM By: Fredirick Maudlin MD FACS Entered By: Fredirick Maudlin on 07/08/2022 15:10:09 -------------------------------------------------------------------------------- HxROS Details Patient  Name: Date of Service: Vincent Vincent Vincent [name] ID Vincent [name]. 07/08/2022 2:00 PM Medical Record Number: [id number] Patient Account Number: [id number] Date of Birth/Sex: Treating RN: 24-Jul-1936 (86 y.o. Vincent [name]) Primary Care Provider: [name] Other Clinician: Referring Provider: Treating Provider/Extender: Celine Ahr in Treatment: 12 Information Obtained From Patient Chart Eyes Medical History: Vincent Vincent [name], Vincent Vincent [name] ([id number]) [id number] Page 13 of 15 Positive for: Cataracts - left  eye; Glaucoma Negative for: Optic Neuritis Respiratory Medical History: Positive for: Chronic Obstructive Pulmonary Disease (COPD) Past Medical History Notes: pulmonary eosinophilia Cardiovascular Medical History: Positive for: Arrhythmia - afib; Congestive Heart Failure; Coronary Artery Disease; Hypertension; Myocardial Infarction Past Medical History Notes: hyperlipidemia, alcoholic cardiomyopathy Gastrointestinal Medical History: Past Medical History Notes: GERD Endocrine Medical History: Positive for: Type II Diabetes Negative for: Type I Diabetes Time with diabetes: 2 yrs Treated with: Oral agents Blood sugar tested every day: Yes Tested : Genitourinary Medical History: Negative for: End Stage Renal Disease Past Medical History Notes: incontinence, enlarged prostate Integumentary (Skin) Medical History: Negative for: History of Burn Musculoskeletal Medical History: Positive for: Gout Neurologic Medical History: Positive for: Neuropathy Past Medical History Notes: left foot drop, stroke Oncologic Medical History: Negative for: Received Chemotherapy; Received Radiation Psychiatric Medical History: Negative for: Anorexia/bulimia; Confinement Anxiety HBO Extended History Items Eyes: Eyes: Cataracts Glaucoma Immunizations Pneumococcal Vaccine: Received Pneumococcal Vaccination: Yes Received Pneumococcal Vaccination On or After 60th Birthday: Yes Implantable Devices No devices added Vincent Vincent [name], Vincent Vincent [name] ([id number]) [id number] Page 14 of 15 Hospitalization / Surgery History Type of Hospitalization/Surgery cardiac cath with stents colonoscopy knee surgery Family and Social History Cancer: Yes - Mother,Siblings; Diabetes: No; Heart Disease: Yes - Father; Hereditary Spherocytosis: No; Hypertension: Yes - Mother,Father; Kidney Disease: No; Lung Disease: No; Seizures: No; Stroke: No; Thyroid Problems: Yes - Siblings; Tuberculosis: No;  Former smoker; Marital Status - Divorced; Alcohol Use: Never; Drug Use: No History; Caffeine Use: Rarely - coffee; Financial Concerns: No; Food, Clothing or Shelter Needs: No; Support System Lacking: No; Transportation Concerns: No Electronic Signature(s) Signed: 07/08/2022 4:34:03 PM By: Fredirick Maudlin MD FACS Entered By: Fredirick Maudlin on 07/08/2022 15:06:23 -------------------------------------------------------------------------------- SuperBill Details Patient Name: Date of Service: Vincent Vincent [name], Vincent Vincent [name] ID Vincent [name]. 07/08/2022 Medical Record Number: [id number] Patient Account Number: [id number] Date of Birth/Sex: Treating RN: 02-02-1936 (86 y.o. Vincent [name]) Primary Care Provider: [name] Other Clinician: Referring Provider: Treating Provider/Extender: Celine Ahr in Treatment: 12 Diagnosis Coding ICD-10 Codes Code Description [phone] Non-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 leg limited to breakdown of skin L97.811 Non-pressure chronic ulcer of other part of right lower leg limited to breakdown of skin I87.2 Venous insufficiency (chronic) (peripheral) I50.9 Heart failure, unspecified E11.622 Type 2 diabetes mellitus with other skin ulcer Facility Procedures : CPT4 Code: [id number] Description: 75797 - DEBRIDE WOUND 1ST 20 SQ CM OR < ICD-10 Diagnosis Description 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 L97.811 Non-pressure chronic ulcer of other part of right lower leg limited to  breakdown Modifier: f skin of skin Quantity: 1 Physician Procedures : CPT4 Code Description Modifier [id number] - WC PHYS LEVEL 3 - EST PT 25 ICD-10 Diagnosis Description 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f skin L97.811 Non-pressure chronic ulcer of other part of  right lower leg limited to breakdown of skin I87.2 Venous insufficiency (chronic) (peripheral) I50.9 Heart  failure, unspecified Quantity: 1 : [id number] - WC PHYS DEBR WO  ANESTH 20 SQ CM ICD-10 Diagnosis Description 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f skin L97.811 Non-pressure chronic ulcer of other part of right lower leg limited to  breakdown of skin Quantity: 1 Electronic Signature(s) Signed: 07/08/2022 3:14:10 PM By: Fredirick Maudlin MD FACS Entered By: Fredirick Maudlin on 07/08/2022 15:14:10 Vincent Vincent [name], Vincent Vincent [name] ([id number]) [id number] Page 15 of 15

## 2022-07-12 ENCOUNTER — Ambulatory Visit: Payer: Medicare Other | Admitting: Podiatry

## 2022-07-14 ENCOUNTER — Other Ambulatory Visit (HOSPITAL_COMMUNITY): Payer: Medicare Other

## 2022-07-18 ENCOUNTER — Encounter (HOSPITAL_BASED_OUTPATIENT_CLINIC_OR_DEPARTMENT_OTHER): Payer: Medicare Other | Admitting: General Surgery

## 2022-07-20 ENCOUNTER — Other Ambulatory Visit: Payer: Self-pay

## 2022-07-20 ENCOUNTER — Encounter (HOSPITAL_COMMUNITY): Payer: Self-pay

## 2022-07-20 ENCOUNTER — Emergency Department (HOSPITAL_COMMUNITY): Payer: Medicare Other

## 2022-07-20 ENCOUNTER — Inpatient Hospital Stay (HOSPITAL_COMMUNITY)
Admission: EM | Admit: 2022-07-20 | Discharge: 2022-07-27 | DRG: 536 | Disposition: A | Discharge status: Skilled Nursing Facility | Payer: Medicare Other | Attending: Internal Medicine | Admitting: Internal Medicine

## 2022-07-20 DIAGNOSIS — Z87891 Personal history of nicotine dependence: Secondary | ICD-10-CM | POA: Diagnosis not present

## 2022-07-20 DIAGNOSIS — Z7984 Long term (current) use of oral hypoglycemic drugs: Secondary | ICD-10-CM | POA: Diagnosis not present

## 2022-07-20 DIAGNOSIS — H409 Unspecified glaucoma: Secondary | ICD-10-CM | POA: Diagnosis present

## 2022-07-20 DIAGNOSIS — S32592A Other specified fracture of left pubis, initial encounter for closed fracture: Secondary | ICD-10-CM | POA: Diagnosis not present

## 2022-07-20 DIAGNOSIS — I6930 Unspecified sequelae of cerebral infarction: Secondary | ICD-10-CM

## 2022-07-20 DIAGNOSIS — S32402A Unspecified fracture of left acetabulum, initial encounter for closed fracture: Secondary | ICD-10-CM | POA: Diagnosis not present

## 2022-07-20 DIAGNOSIS — I2510 Atherosclerotic heart disease of native coronary artery without angina pectoris: Secondary | ICD-10-CM | POA: Diagnosis present

## 2022-07-20 DIAGNOSIS — E1169 Type 2 diabetes mellitus with other specified complication: Secondary | ICD-10-CM | POA: Diagnosis not present

## 2022-07-20 DIAGNOSIS — D649 Anemia, unspecified: Secondary | ICD-10-CM | POA: Diagnosis present

## 2022-07-20 DIAGNOSIS — S32592D Other specified fracture of left pubis, subsequent encounter for fracture with routine healing: Secondary | ICD-10-CM | POA: Diagnosis not present

## 2022-07-20 DIAGNOSIS — Z7901 Long term (current) use of anticoagulants: Secondary | ICD-10-CM | POA: Diagnosis not present

## 2022-07-20 DIAGNOSIS — E1141 Type 2 diabetes mellitus with diabetic mononeuropathy: Secondary | ICD-10-CM | POA: Diagnosis not present

## 2022-07-20 DIAGNOSIS — J90 Pleural effusion, not elsewhere classified: Secondary | ICD-10-CM | POA: Diagnosis not present

## 2022-07-20 DIAGNOSIS — S329XXD Fracture of unspecified parts of lumbosacral spine and pelvis, subsequent encounter for fracture with routine healing: Secondary | ICD-10-CM | POA: Diagnosis not present

## 2022-07-20 DIAGNOSIS — S32425D Nondisplaced fracture of posterior wall of left acetabulum, subsequent encounter for fracture with routine healing: Secondary | ICD-10-CM | POA: Diagnosis not present

## 2022-07-20 DIAGNOSIS — S199XXA Unspecified injury of neck, initial encounter: Secondary | ICD-10-CM | POA: Diagnosis not present

## 2022-07-20 DIAGNOSIS — Z8249 Family history of ischemic heart disease and other diseases of the circulatory system: Secondary | ICD-10-CM | POA: Diagnosis not present

## 2022-07-20 DIAGNOSIS — T148XXA Other injury of unspecified body region, initial encounter: Secondary | ICD-10-CM | POA: Diagnosis not present

## 2022-07-20 DIAGNOSIS — M79671 Pain in right foot: Secondary | ICD-10-CM | POA: Diagnosis not present

## 2022-07-20 DIAGNOSIS — I4821 Permanent atrial fibrillation: Secondary | ICD-10-CM | POA: Diagnosis present

## 2022-07-20 DIAGNOSIS — I110 Hypertensive heart disease with heart failure: Secondary | ICD-10-CM | POA: Diagnosis not present

## 2022-07-20 DIAGNOSIS — I959 Hypotension, unspecified: Secondary | ICD-10-CM | POA: Diagnosis not present

## 2022-07-20 DIAGNOSIS — S32425A Nondisplaced fracture of posterior wall of left acetabulum, initial encounter for closed fracture: Principal | ICD-10-CM | POA: Diagnosis present

## 2022-07-20 DIAGNOSIS — Z79899 Other long term (current) drug therapy: Secondary | ICD-10-CM | POA: Diagnosis not present

## 2022-07-20 DIAGNOSIS — E785 Hyperlipidemia, unspecified: Secondary | ICD-10-CM | POA: Diagnosis not present

## 2022-07-20 DIAGNOSIS — S32512A Fracture of superior rim of left pubis, initial encounter for closed fracture: Secondary | ICD-10-CM | POA: Diagnosis not present

## 2022-07-20 DIAGNOSIS — J449 Chronic obstructive pulmonary disease, unspecified: Secondary | ICD-10-CM | POA: Diagnosis not present

## 2022-07-20 DIAGNOSIS — S32455A Nondisplaced transverse fracture of left acetabulum, initial encounter for closed fracture: Secondary | ICD-10-CM | POA: Diagnosis not present

## 2022-07-20 DIAGNOSIS — S79929A Unspecified injury of unspecified thigh, initial encounter: Secondary | ICD-10-CM | POA: Diagnosis not present

## 2022-07-20 DIAGNOSIS — S32591A Other specified fracture of right pubis, initial encounter for closed fracture: Secondary | ICD-10-CM | POA: Diagnosis not present

## 2022-07-20 DIAGNOSIS — K661 Hemoperitoneum: Secondary | ICD-10-CM | POA: Diagnosis not present

## 2022-07-20 DIAGNOSIS — M79672 Pain in left foot: Secondary | ICD-10-CM | POA: Diagnosis not present

## 2022-07-20 DIAGNOSIS — M109 Gout, unspecified: Secondary | ICD-10-CM | POA: Diagnosis present

## 2022-07-20 DIAGNOSIS — S329XXA Fracture of unspecified parts of lumbosacral spine and pelvis, initial encounter for closed fracture: Secondary | ICD-10-CM | POA: Diagnosis present

## 2022-07-20 DIAGNOSIS — R2681 Unsteadiness on feet: Secondary | ICD-10-CM | POA: Diagnosis not present

## 2022-07-20 DIAGNOSIS — I69359 Hemiplegia and hemiparesis following cerebral infarction affecting unspecified side: Secondary | ICD-10-CM | POA: Diagnosis not present

## 2022-07-20 DIAGNOSIS — S0990XA Unspecified injury of head, initial encounter: Secondary | ICD-10-CM | POA: Diagnosis not present

## 2022-07-20 DIAGNOSIS — T68XXXA Hypothermia, initial encounter: Secondary | ICD-10-CM | POA: Diagnosis not present

## 2022-07-20 DIAGNOSIS — S32409A Unspecified fracture of unspecified acetabulum, initial encounter for closed fracture: Secondary | ICD-10-CM | POA: Diagnosis present

## 2022-07-20 DIAGNOSIS — R609 Edema, unspecified: Secondary | ICD-10-CM | POA: Diagnosis not present

## 2022-07-20 DIAGNOSIS — M6259 Muscle wasting and atrophy, not elsewhere classified, multiple sites: Secondary | ICD-10-CM | POA: Diagnosis not present

## 2022-07-20 DIAGNOSIS — Z741 Need for assistance with personal care: Secondary | ICD-10-CM | POA: Diagnosis not present

## 2022-07-20 DIAGNOSIS — E119 Type 2 diabetes mellitus without complications: Secondary | ICD-10-CM | POA: Diagnosis not present

## 2022-07-20 DIAGNOSIS — W19XXXA Unspecified fall, initial encounter: Secondary | ICD-10-CM | POA: Diagnosis present

## 2022-07-20 DIAGNOSIS — Z955 Presence of coronary angioplasty implant and graft: Secondary | ICD-10-CM

## 2022-07-20 DIAGNOSIS — M6281 Muscle weakness (generalized): Secondary | ICD-10-CM | POA: Diagnosis not present

## 2022-07-20 DIAGNOSIS — I5022 Chronic systolic (congestive) heart failure: Secondary | ICD-10-CM | POA: Diagnosis not present

## 2022-07-20 DIAGNOSIS — I6523 Occlusion and stenosis of bilateral carotid arteries: Secondary | ICD-10-CM | POA: Diagnosis not present

## 2022-07-20 DIAGNOSIS — L089 Local infection of the skin and subcutaneous tissue, unspecified: Secondary | ICD-10-CM | POA: Diagnosis present

## 2022-07-20 DIAGNOSIS — E782 Mixed hyperlipidemia: Secondary | ICD-10-CM | POA: Diagnosis not present

## 2022-07-20 DIAGNOSIS — E876 Hypokalemia: Secondary | ICD-10-CM | POA: Diagnosis not present

## 2022-07-20 DIAGNOSIS — I4891 Unspecified atrial fibrillation: Secondary | ICD-10-CM | POA: Diagnosis not present

## 2022-07-20 DIAGNOSIS — K219 Gastro-esophageal reflux disease without esophagitis: Secondary | ICD-10-CM | POA: Diagnosis present

## 2022-07-20 DIAGNOSIS — R41841 Cognitive communication deficit: Secondary | ICD-10-CM | POA: Diagnosis not present

## 2022-07-20 DIAGNOSIS — M7989 Other specified soft tissue disorders: Secondary | ICD-10-CM | POA: Diagnosis not present

## 2022-07-20 DIAGNOSIS — G319 Degenerative disease of nervous system, unspecified: Secondary | ICD-10-CM | POA: Diagnosis not present

## 2022-07-20 DIAGNOSIS — I10 Essential (primary) hypertension: Secondary | ICD-10-CM | POA: Diagnosis present

## 2022-07-20 DIAGNOSIS — Z041 Encounter for examination and observation following transport accident: Secondary | ICD-10-CM | POA: Diagnosis not present

## 2022-07-20 NOTE — ED Triage Notes (Addendum)
PER EMS: pt is from home after tripping and falling up 2 cement steps. No LOC. He is unable to ambulate. Denies head injury. He reports left hip and upper thigh pain (wounds on his legs are being monitored by the wound center). He takes Eliquis.  BP- 160/80, HR-104, O2-93% RA, CBG-164

## 2022-07-20 NOTE — ED Provider Triage Note (Signed)
Emergency Medicine Provider Triage Evaluation Note  Vincent [name] , a 86 y.o. male  was evaluated in triage.  Pt complains of fall.  Patient tripped down two steps at his home.   Lives independently, on Eliquis for A-fib.  He denies hitting his head, has been unable to ambulate secondary to pain in his left hip. Denies saddle anesthesia, lateralized weakness numbness, vision changes, neck pain, abdominal pain, chest pain  Review of Systems  Per HPI  Physical Exam  BP (!) 158/95 (BP Location: Right Arm)   Pulse 96   Temp 98.2 F (36.8 C) (Oral)   Resp 16   Ht '5\' 9"'$  (1.753 m)   Wt 81.6 kg   SpO2 95%   BMI 26.58 kg/m  Gen:   Awake, no distress   Resp:  Normal effort  MSK:   Moves extremities without difficulty  Other:  Abrasion over right fourth toe.  Tenderness over left hip.  Cranial nerves II through XII are grossly intact.  Lung sounds are present, regular rhythm.  No low back pain, cervical spine tenderness, moving upper extremities without difficulty  Medical Decision Making  Medically screening exam initiated at 9:41 PM.  Appropriate orders placed.  [name] was informed that the remainder of the evaluation will be completed by another provider, this initial triage assessment does not replace that evaluation, and the importance of remaining in the ED until their evaluation is complete.     Sherrill Raring, PA-C 07/20/22 2143

## 2022-07-21 ENCOUNTER — Emergency Department (HOSPITAL_COMMUNITY): Payer: Medicare Other

## 2022-07-21 ENCOUNTER — Other Ambulatory Visit: Payer: Self-pay

## 2022-07-21 DIAGNOSIS — S32409A Unspecified fracture of unspecified acetabulum, initial encounter for closed fracture: Secondary | ICD-10-CM | POA: Insufficient documentation

## 2022-07-21 DIAGNOSIS — S31109A Unspecified open wound of abdominal wall, unspecified quadrant without penetration into peritoneal cavity, initial encounter: Secondary | ICD-10-CM

## 2022-07-21 DIAGNOSIS — I2510 Atherosclerotic heart disease of native coronary artery without angina pectoris: Secondary | ICD-10-CM

## 2022-07-21 DIAGNOSIS — S32425A Nondisplaced fracture of posterior wall of left acetabulum, initial encounter for closed fracture: Principal | ICD-10-CM

## 2022-07-21 DIAGNOSIS — W19XXXA Unspecified fall, initial encounter: Secondary | ICD-10-CM | POA: Diagnosis not present

## 2022-07-21 DIAGNOSIS — L089 Local infection of the skin and subcutaneous tissue, unspecified: Secondary | ICD-10-CM

## 2022-07-21 DIAGNOSIS — I6930 Unspecified sequelae of cerebral infarction: Secondary | ICD-10-CM

## 2022-07-21 DIAGNOSIS — S32591A Other specified fracture of right pubis, initial encounter for closed fracture: Secondary | ICD-10-CM | POA: Diagnosis not present

## 2022-07-21 DIAGNOSIS — E119 Type 2 diabetes mellitus without complications: Secondary | ICD-10-CM

## 2022-07-21 DIAGNOSIS — Z7901 Long term (current) use of anticoagulants: Secondary | ICD-10-CM

## 2022-07-21 DIAGNOSIS — D649 Anemia, unspecified: Secondary | ICD-10-CM | POA: Diagnosis present

## 2022-07-21 DIAGNOSIS — S32592A Other specified fracture of left pubis, initial encounter for closed fracture: Secondary | ICD-10-CM

## 2022-07-21 DIAGNOSIS — I4821 Permanent atrial fibrillation: Secondary | ICD-10-CM

## 2022-07-21 DIAGNOSIS — I5022 Chronic systolic (congestive) heart failure: Secondary | ICD-10-CM

## 2022-07-21 DIAGNOSIS — I10 Essential (primary) hypertension: Secondary | ICD-10-CM

## 2022-07-21 DIAGNOSIS — S329XXA Fracture of unspecified parts of lumbosacral spine and pelvis, initial encounter for closed fracture: Secondary | ICD-10-CM | POA: Diagnosis present

## 2022-07-21 DIAGNOSIS — E782 Mixed hyperlipidemia: Secondary | ICD-10-CM

## 2022-07-21 DIAGNOSIS — T148XXA Other injury of unspecified body region, initial encounter: Secondary | ICD-10-CM

## 2022-07-21 LAB — CBC
HCT: 37.9 % — ABNORMAL LOW (ref 39.0–52.0)
Hemoglobin: 11.8 g/dL — ABNORMAL LOW (ref 13.0–17.0)
MCH: 27.2 pg (ref 26.0–34.0)
MCHC: 31.1 g/dL (ref 30.0–36.0)
MCV: 87.3 fL (ref 80.0–100.0)
Platelets: 159 10*3/uL (ref 150–400)
RBC: 4.34 MIL/uL (ref 4.22–5.81)
RDW: 19 % — ABNORMAL HIGH (ref 11.5–15.5)
WBC: 7.7 10*3/uL (ref 4.0–10.5)
nRBC: 0 % (ref 0.0–0.2)

## 2022-07-21 LAB — BASIC METABOLIC PANEL
Anion gap: 17 — ABNORMAL HIGH (ref 5–15)
BUN: 14 mg/dL (ref 8–23)
CO2: 22 mmol/L (ref 22–32)
Calcium: 9.2 mg/dL (ref 8.9–10.3)
Chloride: 102 mmol/L (ref 98–111)
Creatinine, Ser: 1.02 mg/dL (ref 0.61–1.24)
GFR, Estimated: >60 mL/min (ref >60)
Glucose, Bld: 159 mg/dL — ABNORMAL HIGH (ref 70–99)
Potassium: 3.7 mmol/L (ref 3.5–5.1)
Sodium: 141 mmol/L (ref 135–145)

## 2022-07-21 LAB — HEMOGLOBIN AND HEMATOCRIT, BLOOD
HCT: 34.4 % — ABNORMAL LOW (ref 39.0–52.0)
Hemoglobin: 10.6 g/dL — ABNORMAL LOW (ref 13.0–17.0)

## 2022-07-21 LAB — BRAIN NATRIURETIC PEPTIDE: B Natriuretic Peptide: 3666.9 pg/mL — ABNORMAL HIGH (ref 0.0–100.0)

## 2022-07-21 MED ORDER — LATANOPROST 0.005 % OP SOLN
1.0000 [drp] | Freq: Every day | OPHTHALMIC | Status: DC
  Administered 2022-07-21 – 2022-07-26 (×6): 1 [drp] via OPHTHALMIC
  Filled 2022-07-21 (×2): qty 2.5

## 2022-07-21 MED ORDER — FUROSEMIDE 40 MG PO TABS
40.0000 mg | ORAL_TABLET | Freq: Every day | ORAL | Status: DC
  Administered 2022-07-21 – 2022-07-23 (×3): 40 mg via ORAL
  Filled 2022-07-21: qty 2
  Filled 2022-07-21 (×3): qty 1

## 2022-07-21 MED ORDER — MORPHINE SULFATE (PF) 4 MG/ML IV SOLN
4.0000 mg | Freq: Once | INTRAVENOUS | Status: AC
  Administered 2022-07-21: 4 mg via INTRAVENOUS
  Filled 2022-07-21: qty 1

## 2022-07-21 MED ORDER — BRIMONIDINE TARTRATE 0.15 % OP SOLN
1.0000 [drp] | Freq: Two times a day (BID) | OPHTHALMIC | Status: DC
  Administered 2022-07-21 – 2022-07-27 (×12): 1 [drp] via OPHTHALMIC
  Filled 2022-07-21 (×2): qty 5

## 2022-07-21 MED ORDER — APIXABAN 5 MG PO TABS
5.0000 mg | ORAL_TABLET | Freq: Two times a day (BID) | ORAL | Status: DC
  Administered 2022-07-21 – 2022-07-27 (×13): 5 mg via ORAL
  Filled 2022-07-21 (×13): qty 1

## 2022-07-21 MED ORDER — ALBUTEROL SULFATE (2.5 MG/3ML) 0.083% IN NEBU: 2.5000 mg | INHALATION_SOLUTION | Freq: Four times a day (QID) | RESPIRATORY_TRACT | Status: DC | PRN

## 2022-07-21 MED ORDER — ONDANSETRON HCL 4 MG PO TABS: 4.0000 mg | ORAL_TABLET | Freq: Four times a day (QID) | ORAL | Status: DC | PRN

## 2022-07-21 MED ORDER — HYDROCODONE-ACETAMINOPHEN 5-325 MG PO TABS
1.0000 | ORAL_TABLET | Freq: Four times a day (QID) | ORAL | Status: DC | PRN
  Administered 2022-07-21 – 2022-07-27 (×11): 1 via ORAL
  Filled 2022-07-21 (×11): qty 1

## 2022-07-21 MED ORDER — DIGOXIN 125 MCG PO TABS
0.1250 mg | ORAL_TABLET | Freq: Every day | ORAL | Status: DC
  Administered 2022-07-21 – 2022-07-27 (×7): 0.125 mg via ORAL
  Filled 2022-07-21 (×7): qty 1

## 2022-07-21 MED ORDER — SODIUM CHLORIDE 0.9% FLUSH
3.0000 mL | Freq: Two times a day (BID) | INTRAVENOUS | Status: DC
  Administered 2022-07-21 – 2022-07-27 (×9): 3 mL via INTRAVENOUS

## 2022-07-21 MED ORDER — SACUBITRIL-VALSARTAN 97-103 MG PO TABS
1.0000 | ORAL_TABLET | Freq: Two times a day (BID) | ORAL | Status: DC
  Administered 2022-07-21 – 2022-07-23 (×5): 1 via ORAL
  Filled 2022-07-21 (×7): qty 1

## 2022-07-21 MED ORDER — LORATADINE 10 MG PO TABS
10.0000 mg | ORAL_TABLET | Freq: Every day | ORAL | Status: DC
  Administered 2022-07-21 – 2022-07-27 (×7): 10 mg via ORAL
  Filled 2022-07-21 (×7): qty 1

## 2022-07-21 MED ORDER — MORPHINE SULFATE (PF) 4 MG/ML IV SOLN: 4.0000 mg | INTRAVENOUS | Status: DC | PRN

## 2022-07-21 MED ORDER — ISOSORBIDE MONONITRATE ER 30 MG PO TB24
30.0000 mg | ORAL_TABLET | Freq: Every day | ORAL | Status: DC
  Administered 2022-07-21 – 2022-07-27 (×6): 30 mg via ORAL
  Filled 2022-07-21 (×8): qty 1

## 2022-07-21 MED ORDER — ONDANSETRON HCL 4 MG/2ML IJ SOLN: 4.0000 mg | Freq: Four times a day (QID) | INTRAMUSCULAR | Status: DC | PRN

## 2022-07-21 MED ORDER — ACETAMINOPHEN 650 MG RE SUPP: 650.0000 mg | Freq: Four times a day (QID) | RECTAL | Status: DC | PRN

## 2022-07-21 MED ORDER — EMPAGLIFLOZIN 25 MG PO TABS
25.0000 mg | ORAL_TABLET | Freq: Every day | ORAL | Status: DC
  Administered 2022-07-22 – 2022-07-27 (×6): 25 mg via ORAL
  Filled 2022-07-21 (×6): qty 1

## 2022-07-21 MED ORDER — ACETAMINOPHEN 325 MG PO TABS
650.0000 mg | ORAL_TABLET | Freq: Four times a day (QID) | ORAL | Status: DC | PRN
  Filled 2022-07-21: qty 2

## 2022-07-21 MED ORDER — ATORVASTATIN CALCIUM 80 MG PO TABS
80.0000 mg | ORAL_TABLET | Freq: Every day | ORAL | Status: DC
  Administered 2022-07-21 – 2022-07-27 (×7): 80 mg via ORAL
  Filled 2022-07-21 (×7): qty 1

## 2022-07-21 MED ORDER — SPIRONOLACTONE 25 MG PO TABS
25.0000 mg | ORAL_TABLET | Freq: Every day | ORAL | Status: DC
  Administered 2022-07-21 – 2022-07-23 (×3): 25 mg via ORAL
  Filled 2022-07-21 (×4): qty 1

## 2022-07-21 MED ORDER — FAMOTIDINE 20 MG PO TABS
20.0000 mg | ORAL_TABLET | Freq: Every day | ORAL | Status: DC
  Administered 2022-07-21 – 2022-07-27 (×7): 20 mg via ORAL
  Filled 2022-07-21 (×7): qty 1

## 2022-07-21 MED ORDER — CARVEDILOL 6.25 MG PO TABS
6.2500 mg | ORAL_TABLET | Freq: Two times a day (BID) | ORAL | Status: DC
  Administered 2022-07-21 – 2022-07-27 (×14): 6.25 mg via ORAL
  Filled 2022-07-21 (×13): qty 1
  Filled 2022-07-21: qty 2

## 2022-07-21 NOTE — H&P (Addendum)
History and Physical    Patient: Vincent [name] ZHG:[id number] DOB: [birth year] DOA: 07/20/2022 DOS: the patient was seen and examined on 07/21/2022 PCP: [name], MD  Patient coming from: Home via EMS  Chief Complaint:  Chief Complaint  Patient presents with   Fall   HPI: Vincent [name] is a 86 y.o. male with medical history significant of heart failure with reduced EF, permanent atrial fibrillation on anticoagulation, CAD s/p PCI to RCA, diabetes mellitus, CVA, and chronic wounds who presents after having a fall with left hip pain.  He was trying to go into his house where he has 3 steps.  He had tried to step up on the top step with his right foot when he miss stepped causing him to lose his balance, spin around, and fall landing on his left side.  He had not felt lightheaded or any palpitation prior to the fall.  He denied any loss of consciousness or trauma to his head.  He had not felt any significant pain initially, but states that after he was helped up by his sister he had severe pain on the left hip for which he was unable to bear weight.  Otherwise patient has been in his normal state of health.  Records note patient was last hospitalized 05/2022 after having a fall due to reports of dizziness with acute on chronic CHF.  He states that he has a cane which he can use at home, but normally has been ambulating without assistance.  His weight has been stable and he denies any complaints of shortness of breath.  In the emergency department patient was noted to have relatively stable vital signs.  Labs appeared to be stable.  CT the head and cervical spine did not note any acute abnormalities.  Chest x-ray noted cardiac enlargement with small bilateral pleural effusion.  CT scan of the left hip noted minimally displaced fracture of the superior pubic ramus extending into the acetabulum and anterior column on the left. Nondisplaced fracture of the posterior wall of the acetabulum. Mildly  displaced fracture of the inferior pubic ramus on the left. Strandy hemorrhage along the lateral pelvic wall on the left.  Patient has been given morphine IV for pain.  Dr. Stann Mainland of orthopedics consulted recommending nonoperative management with PT/OT, and weightbearing as tolerated.    Review of Systems: As mentioned in the history of present illness. All other systems reviewed and are negative. 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 s/p remote stent to Cx 2004 with chronic stable angina in context of residual circumflex disease.   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/25/2015   NSVT (nonsustained ventricular tachycardia) (HCC)    Permanent atrial fibrillation (HCC)    Pulmonary eosinophilia (HCC)    Sinus bradycardia    Stroke [facility])    Syncope and collapse    Unspecified glaucoma(365.9)  Past Surgical History:  Procedure Laterality Date   CARDIAC CATHETERIZATION N/A 07/30/2015   Procedure: Left Heart Cath and Coronary Angiography;  Surgeon: Belva Crome, MD;  Location: [place] [facility];  Service: Cardiovascular;  Laterality: N/A;   COLONOSCOPY     KNEE SURGERY     loop recorder  09/2007   RIGHT/LEFT HEART CATH AND CORONARY ANGIOGRAPHY N/A 09/20/2021   Procedure: RIGHT/LEFT HEART CATH AND CORONARY ANGIOGRAPHY;  Surgeon: Martinique, Peter M, MD;  Location: [place] [facility];  Service: Cardiovascular;  Laterality: N/A;   Social History:  reports that he  quit smoking about 38 years ago. His smoking use included cigarettes. He has a 7.50 pack-year smoking history. He has never used smokeless tobacco. He reports that he does not drink alcohol and does not use drugs.  No Known Allergies  Family History  Problem Relation Age of Onset   Colon cancer Mother    Hypertension Mother    Cancer Mother    Prostate cancer Brother    Cancer Brother    Heart disease Father    Heart attack Father    Hypertension Father    Heart attack Sister    Cancer Sister    Stroke Neg Hx    Colon polyps Neg Hx    Esophageal cancer Neg Hx    Stomach cancer Neg Hx    Rectal cancer Neg Hx    Pancreatic cancer Neg Hx     Prior to Admission medications   Medication Sig Start Date End Date Taking? Authorizing Provider  acetaminophen (TYLENOL) 500 MG tablet Take 2 tablets (1,000 mg total) by mouth every 6 (six) hours as needed. Patient taking differently: Take 1,000 mg by mouth every 6 (six) hours as needed for moderate pain or mild pain. 02/21/21  Yes [name], MD  apixaban (ELIQUIS) 5 MG TABS tablet Take 1 tablet (5 mg total) by mouth 2 (two) times daily. 05/16/22  Yes Larey Dresser, MD  atorvastatin (LIPITOR) 80 MG tablet Take 1 tablet (80 mg total) by mouth daily. 06/29/22  Yes Milford, Maricela Bo, FNP  Brimonidine Tartrate (ALPHAGAN P OP) Place 1 drop into both eyes in the morning and at bedtime.   Yes [provider]  Calcium Carbonate-Vitamin D (OSCAL 500 D-3 PO) Take 1 tablet by mouth 2 (two) times daily.   Yes [provider]  Cyanocobalamin (VITAMIN B-12 CR PO) Take 1 tablet by mouth daily.   Yes [provider]  digoxin (LANOXIN) 0.125 MG tablet Take 1 tablet (0.125 mg total) by mouth daily. 06/29/22  Yes Milford, Maricela Bo, FNP  empagliflozin (JARDIANCE) 25 MG TABS tablet Take 25 mg by mouth daily.   Yes [provider]  famotidine (PEPCID) 20 MG tablet Take 20 mg by mouth daily.   Yes [provider]   Ferrous Sulfate (IRON PO) Take 1 tablet by mouth daily.   Yes [provider]  furosemide (LASIX) 40 MG tablet Take 40 mg by mouth daily.   Yes [provider]  isosorbide mononitrate (IMDUR) 30 MG 24 hr tablet Take 1 tablet (30 mg total) by mouth daily. 12/17/21  Yes Larey Dresser, MD  loratadine (CLARITIN) 10 MG tablet Take 10 mg by mouth daily.   Yes [provider]  LUMIGAN 0.01 % SOLN Place 1 drop into both eyes at bedtime. 06/05/22  Yes [provider]  Magnesium 400 MG TABS Take 400 mg by mouth 2 (two) times daily.   Yes  [provider]  metFORMIN (GLUCOPHAGE) 500 MG tablet Take 500 mg by mouth 2 (two) times daily. 06/16/19  Yes [provider]  nitroGLYCERIN (NITROLINGUAL) 0.4 MG/SPRAY spray Place 1 spray under the tongue every 5 (five) minutes x 3 doses as needed for chest pain. 06/29/22  Yes Milford, Muniz, FNP  Omega-3 Fatty Acids (OMEGA-3 FISH OIL PO) Take 1 capsule by mouth daily.   Yes [provider]  sacubitril-valsartan (ENTRESTO) 97-103 MG Take 1 tablet by mouth 2 (two) times daily. 12/07/21  Yes Larey Dresser, MD  spironolactone (ALDACTONE) 25 MG tablet Take 25 mg by mouth daily.   Yes [provider]  carvedilol (COREG) 6.25 MG tablet Take 1 tablet (6.25 mg total) by mouth 2 (two) times daily with a meal. 12/07/21   Larey Dresser, MD  OCUFLOX 0.3 % ophthalmic solution Place 1 drop into the left eye in the morning and at bedtime. Patient not taking: Reported on 07/21/2022 02/04/22   [provider]  PROLENSA 0.07 % SOLN Place 1 drop into the left eye daily. Patient not taking: Reported on 07/21/2022 02/04/22   [provider]    Physical Exam: Vitals:   07/21/22 0215 07/21/22 0335 07/21/22 0445 07/21/22 0530  BP: (!) 140/88 (!) 147/79 137/74 130/75  Pulse: 75 73 69 68  Resp: '17 19 15 12  '$ Temp:  97.6 F (36.4 C)    TempSrc:  Oral    SpO2: 100% 99% 99% 98%  Weight:       Height:       Exam  Constitutional: Elderly male currently in no acute distress Eyes: PERRL, lids and conjunctivae normal ENMT: Mucous membranes are moist.   Neck: normal, supple, no significant JVD Respiratory: Normal respiratory effort without significant wheezes appreciated at this time.  Patient on 2 L of nasal cannula oxygen with O2 saturations maintained. Cardiovascular: Irregular irregular.  No significant lower extremity edema appreciated at this time. Abdomen: no tenderness, no masses palpated. No hepatosplenomegaly. Bowel sounds positive.  Musculoskeletal: Clubbing present.  No joint deformity upper and lower extremities.   Skin: Bilateral lower extremity wounds currently wrapped Neurologic: CN 2-12 grossly intact.  Patient noted to have some decreased ability to dorsiflex left foot when compared to the right. Psychiatric: Normal judgment and insight. Alert and oriented x 3. Normal mood.   Data Reviewed:  EKG revealed atrial fibrillation at 97 bpm.  Reviewed labs, imaging and pertinent records as noted above in HPI.  Assessment and Plan: Acetabulum and pubic rami fractures secondary to fall Acute.  Patient presents after having what sounds like a mechanical fall where he lost his balance landing on his left hip.  CT imaging noted minimally displaced fracture of the superior pubic ramus extending into the acetabulum and anterior column on the left, nondisplaced fracture of the posterior wall of the acetabulum, and mildly displaced fracture of the inferior pubic ramus on the left.  Orthopedics have been consulted and recommended nonoperative management with pain control and PT/OT. -Admit to medical telemetry bed -Weightbearing as tolerated -Hydrocodone/morphine IV as needed for moderate to severe pain -PT/OT to eval and treat -Transitions of care consulted, for possible need of equipment, outpatient PT, and/or rehab  Permanent atrial fibrillation on chronic  anticoagulation Appears rate controlled at this time.  Patient has been set up with a Holter monitor by cardiology. -Continue Coreg, digoxin and Eliquis -Continue outpatient follow-up with cardiology  Normocytic anemia Stable.  Hemoglobin 11.8 g/dL which appears around patient's  baseline. -Recheck H&H given finding of small hemorrhage on CT  Chronic systolic CHF Chronic.  Chest x-ray noted cardiomegaly with small bilateral pleural effusions.  Last echocardiogram noted EF to be 25-30% with global hypokinesis of the left ventricle 03/2022. -Strict I&Os and daily weights -Check BNP -Continue current regimen of Coreg, digoxin, Entresto, Jardiance, and  CAD Prior history of PCI to RCA remotely.  Last heart cath 1/23 noted patent RCA stent with 70% stenosis distal left circumflex. -Continue isosorbide mononitrate and atorvastatin   Essential hypertension Continue Coreg, Entresto, and isosorbide mononitrate   Controlled diabetes mellitus type 2, without long-term use of insulin On admission glucose 159.  Last hemoglobin A1c 7 on 09/17/2021.   -Continue Jardiance -Hold metformin -Heart healthy and carb modified diet   Hyperlipidemia -Continue atorvastatin  History of stroke Patient with prior history of right MCA with prior reports of blood foot drop although patient denies this he does have some weakness in the left foot with dorsiflexion when compared to the right..  Chronic wounds of the lower extremities Patient has been being followed by wound care in the outpatient setting and has been having dressings changed with home health. -Continue outpatient follow-up with wound care   DVT prophylaxis: Eliquis Advance Care Planning:   Code Status: Full Code   Consults: Orthopedics  Family Communication: None room posted  Severity of Illness: The appropriate patient status for this patient is OBSERVATION. Observation status is judged to be reasonable and necessary in order to provide  the required intensity of service to ensure the patient's safety. The patient's presenting symptoms, physical exam findings, and initial radiographic and laboratory data in the context of their medical condition is felt to place them at decreased risk for further clinical deterioration. Furthermore, it is anticipated that the patient will be medically stable for discharge from the hospital within 2 midnights of admission.   Author: Norval Morton, MD 07/21/2022 7:56 AM  For on call review [email or web address].

## 2022-07-21 NOTE — ED Notes (Signed)
Patient transported to CT 

## 2022-07-21 NOTE — Progress Notes (Signed)
9 beat run of v-tach called to NP on call.

## 2022-07-21 NOTE — ED Provider Notes (Signed)
[facility] EMERGENCY DEPARTMENT Provider Note   CSN: [id number] Arrival date & time: 07/20/22  2133     History  Chief Complaint  Patient presents with   Vincent [name] is a 86 y.o. male with past medical history significant for hypertension, stroke, hyperlipidemia, previous alcohol abuse, diabetes, chronic lower extremity wounds who presents with concern for significant left hip pain after falling onto cement steps from home.  Patient reports that he is unable to ambulate.  Patient takes Eliquis. Denies Head injury at this time.   Fall       Home Medications Prior to Admission medications   Medication Sig Start Date End Date Taking? Authorizing Provider  acetaminophen (TYLENOL) 500 MG tablet Take 2 tablets (1,000 mg total) by mouth every 6 (six) hours as needed. Patient taking differently: Take 1,000 mg by mouth every 6 (six) hours as needed for moderate pain or mild pain. 02/21/21   [name], MD  apixaban (ELIQUIS) 5 MG TABS tablet Take 1 tablet (5 mg total) by mouth 2 (two) times daily. 05/16/22   Larey Dresser, MD  atorvastatin (LIPITOR) 80 MG tablet Take 1 tablet (80 mg total) by mouth daily. 06/29/22   Milford, Maricela Bo, FNP  Brimonidine Tartrate (ALPHAGAN P OP) Place 1 drop into both eyes in the morning, at noon, and at bedtime.    [provider]  Calcium Carbonate-Vitamin D (OSCAL 500 D-3 PO) Take 1 tablet by mouth 2 (two) times daily.    [provider]  carvedilol (COREG) 6.25 MG tablet Take 1 tablet (6.25 mg total) by mouth 2 (two) times daily with a meal. 12/07/21   Larey Dresser, MD  Cyanocobalamin (VITAMIN B-12 CR PO) Take 1 tablet by mouth daily.    [provider]  digoxin (LANOXIN) 0.125 MG tablet Take 1 tablet (0.125 mg total) by mouth daily. 06/29/22   Milford, Maricela Bo, FNP  empagliflozin (JARDIANCE) 25 MG TABS tablet Take 25 mg by mouth in the morning and at bedtime.    [provider]   famotidine (PEPCID) 20 MG tablet Take 20 mg by mouth 2 (two) times daily.    [provider]  Ferrous Sulfate (IRON PO) Take 1 tablet by mouth daily.    [provider]  furosemide (LASIX) 40 MG tablet Take 40 mg by mouth 2 (two) times daily.    [provider]  isosorbide mononitrate (IMDUR) 30 MG 24 hr tablet Take 1 tablet (30 mg total) by mouth daily. 12/17/21   Larey Dresser, MD  loratadine (CLARITIN) 10 MG tablet Take 10 mg by mouth daily.    [provider]  LUMIGAN 0.01 % SOLN Place 1 drop into both eyes at bedtime. 06/05/22   [provider]  Magnesium 250 MG TABS Take 1 tablet by mouth 2 (two) times daily.    [provider]  metFORMIN (GLUCOPHAGE) 500 MG tablet Take 500 mg by mouth 2 (two) times daily. 06/16/19   [provider]  nitroGLYCERIN (NITROLINGUAL) 0.4 MG/SPRAY spray Place 1 spray under the tongue every 5 (five) minutes x 3 doses as needed for chest pain. 06/29/22   Milford, Maricela Bo, FNP  OCUFLOX 0.3 % ophthalmic solution Place 1 drop into the left eye 4 (four) times daily. 02/04/22   [provider]  Omega-3 Fatty Acids (OMEGA-3 FISH OIL PO) Take 1 capsule by mouth daily.    [provider]  PROLENSA 0.07 % SOLN  Place 1 drop into the left eye daily. 02/04/22   [provider]  sacubitril-valsartan (ENTRESTO) 97-103 MG Take 1 tablet by mouth 2 (two) times daily. 12/07/21   Larey Dresser, MD  spironolactone (ALDACTONE) 25 MG tablet Take 25 mg by mouth daily.    [provider]      Allergies    Patient has no known allergies.    Review of Systems   Review of Systems  Musculoskeletal:  Positive for gait problem.  All other systems reviewed and are negative.   Physical Exam Updated Vital Signs BP (!) 147/79 (BP Location: Right Arm)   Pulse 73   Temp 97.6 F (36.4 C) (Oral)   Resp 19   Ht '5\' 9"'$  (1.753 m)   Wt 81.6 kg   SpO2 99%   BMI 26.58 kg/m  Physical  Exam Vitals and nursing note reviewed.  Constitutional:      General: He is not in acute distress.    Appearance: Normal appearance.  HENT:     Head: Normocephalic and atraumatic.  Eyes:     General:        Right eye: No discharge.        Left eye: No discharge.  Cardiovascular:     Rate and Rhythm: Normal rate and regular rhythm.     Pulses: Normal pulses.     Heart sounds: No murmur heard.    No friction rub. No gallop.  Pulmonary:     Effort: Pulmonary effort is normal.     Breath sounds: Normal breath sounds.  Abdominal:     General: Bowel sounds are normal.     Palpations: Abdomen is soft.  Musculoskeletal:     Comments: Patient with tenderness to palpation over the left lateral hip without obvious step-off or deformity.  He has decreased strength secondary to pain, effort to flexion, extension of the left hip.  Normal range of motion both actively and passively at the left knee, and ankle.  No other signs of acute trauma noted.  Skin:    General: Skin is warm and dry.     Capillary Refill: Capillary refill takes less than 2 seconds.     Comments: Patient with wrapped lower extremity wounds, with no active purulence, drainage, no significant induration or ecchymosis noted  Neurological:     Mental Status: He is alert and oriented to person, place, and time.  Psychiatric:        Mood and Affect: Mood normal.        Behavior: Behavior normal.     ED Results / Procedures / Treatments   Labs (all labs ordered are listed, but only abnormal results are displayed) Labs Reviewed  CBC - Abnormal; Notable for the following components:      Result Value   Hemoglobin 11.8 (*)    HCT 37.9 (*)    RDW 19.0 (*)    All other components within normal limits  BASIC METABOLIC PANEL - Abnormal; Notable for the following components:   Glucose, Bld 159 (*)    Anion gap 17 (*)    All other components within normal limits    EKG None  Radiology CT Hip Left Wo Contrast  Result  Date: 07/21/2022 CLINICAL DATA:  Hip trauma, fracture suspected. EXAM: CT OF THE LEFT HIP WITHOUT CONTRAST TECHNIQUE: Multidetector CT imaging of the left hip was performed according to the standard protocol. Multiplanar CT image reconstructions were also generated. RADIATION DOSE REDUCTION: This exam was performed  according to the departmental dose-optimization program which includes automated exposure control, adjustment of the mA and/or kV according to patient size and/or use of iterative reconstruction technique. COMPARISON:  07/20/2022. FINDINGS: Bones/Joint/Cartilage There is a minimally displaced fracture of the inferior pubic ramus on the left. There is a minimally displaced fracture of the left superior pubic ramus extending into the anterior column of the acetabulum. There is a nondisplaced fracture of the posterior wall of the acetabulum. The remaining bony structures are intact. No dislocation. Degenerative changes are noted at the left hip and in the lower lumbar spine. Ligaments Suboptimally assessed by CT. Muscles and Tendons No intramuscular hematoma. Soft tissues Atherosclerotic calcification of the aorta. Anasarca is noted. Colonic diverticulosis without diverticulitis. A small amount of blood products are noted along the lateral pelvis wall on the left in the region of pelvic fracture. Fluid attenuation is noted in the scrotum, possible hydrocele. IMPRESSION: 1. Minimally displaced fracture of the superior pubic ramus extending into the acetabulum and anterior column on the left. 2. Nondisplaced fracture of the posterior wall of the acetabulum. 3. Mildly displaced fracture of the inferior pubic ramus on the left. 4. Strandy hemorrhage along the lateral pelvic wall on the left. 5. Aortic atherosclerosis. Electronically Signed   By: Brett Fairy M.D.   On: 07/21/2022 02:37   CT Cervical Spine Wo Contrast  Result Date: 07/20/2022 CLINICAL DATA:  Trauma. EXAM: CT CERVICAL SPINE WITHOUT CONTRAST  TECHNIQUE: Multidetector CT imaging of the cervical spine was performed without intravenous contrast. Multiplanar CT image reconstructions were also generated. RADIATION DOSE REDUCTION: This exam was performed according to the departmental dose-optimization program which includes automated exposure control, adjustment of the mA and/or kV according to patient size and/or use of iterative reconstruction technique. COMPARISON:  None Available. FINDINGS: Alignment: Normal. Skull base and vertebrae: No acute fracture. No primary bone lesion or focal pathologic process. Soft tissues and spinal canal: No prevertebral fluid or swelling. No visible canal hematoma. Disc levels: There is disc space narrowing and endplate osteophyte formation at C3-C4, C4-C5, C5-C6 and C6-C7 compatible with degenerative change. There is mild-to-moderate neural foraminal stenosis on the left at these levels with mild multilevel central canal stenosis. Upper chest: There is a small right pleural effusion. Other: There are atherosclerotic calcifications of the aorta. IMPRESSION: 1. No acute fracture or traumatic subluxation of the cervical spine. 2. Multilevel degenerative changes of the cervical spine. 3. Small right pleural effusion. Aortic Atherosclerosis (ICD10-I70.0). Electronically Signed   By: Ronney Asters M.D.   On: 07/20/2022 23:17   CT Head Wo Contrast  Result Date: 07/20/2022 CLINICAL DATA:  Head trauma EXAM: CT HEAD WITHOUT CONTRAST TECHNIQUE: Contiguous axial images were obtained from the base of the skull through the vertex without intravenous contrast. RADIATION DOSE REDUCTION: This exam was performed according to the departmental dose-optimization program which includes automated exposure control, adjustment of the mA and/or kV according to patient size and/or use of iterative reconstruction technique. COMPARISON:  CT head 06/10/2022.  MRI brain 03/19/2014. FINDINGS: Brain: There is no acute infarct, acute intracranial  hemorrhage, extra-axial fluid collection, mass effect or midline shift. Large old right MCA distribution infarct appears unchanged from prior. Old bilateral cerebellar infarcts appear unchanged from prior. No hydrocephalus. Mild diffuse atrophy is stable. Vascular: Atherosclerotic calcifications are present within the cavernous internal carotid arteries. Skull: Normal. Negative for fracture or focal lesion. Sinuses/Orbits: No acute finding. Other: None. IMPRESSION: 1. No acute intracranial process. 2. Unchanged old right MCA distribution infarct and  old bilateral cerebellar infarcts. Electronically Signed   By: Ronney Asters M.D.   On: 07/20/2022 23:06   DG Foot Complete Right  Result Date: 07/20/2022 CLINICAL DATA:  Left foot pain after a fall. EXAM: RIGHT FOOT COMPLETE - 3+ VIEW COMPARISON:  None Available. FINDINGS: Degenerative changes in the interphalangeal and metatarsal-phalangeal joints. Left hallux valgus deformity. No evidence of acute fracture or dislocation. Dorsal soft tissue swelling. Vascular calcifications. IMPRESSION: Degenerative changes in the left foot with hallux valgus deformity. No acute displaced fractures identified. Dorsal soft tissue swelling. Electronically Signed   By: Lucienne Capers M.D.   On: 07/20/2022 22:36   DG Chest 2 View  Result Date: 07/20/2022 CLINICAL DATA:  Left hip and foot pain after a fall. EXAM: CHEST - 2 VIEW COMPARISON:  06/11/2022 FINDINGS: Loop recorder is present. Shallow inspiration. Heart size is enlarged. No vascular congestion, edema, or consolidation. Small bilateral pleural effusions are suggested. No pneumothorax. Visualized ribs are nondepressed. Degenerative changes in the spine and shoulders. IMPRESSION: Cardiac enlargement with small bilateral pleural effusions. Electronically Signed   By: Lucienne Capers M.D.   On: 07/20/2022 22:34   DG Hip Unilat W or Wo Pelvis 2-3 Views Left  Result Date: 07/20/2022 CLINICAL DATA:  Left hip and foot  pain after a fall. EXAM: DG HIP (WITH OR WITHOUT PELVIS) 2-3V LEFT COMPARISON:  None Available. FINDINGS: Acute mildly displaced fractures identified in the left superior and inferior pubic rami with superior ramus fracture extending to the base of the acetabulum. No fracture or dislocation suggested in the hip joint. SI joints and symphysis pubis are not displaced. Visualized sacrum appears intact. Vascular calcifications. IMPRESSION: Fracture of the left superior and inferior pubic rami extending to the central acetabulum. Electronically Signed   By: Lucienne Capers M.D.   On: 07/20/2022 22:32    Procedures Procedures    Medications Ordered in ED Medications  morphine (PF) 4 MG/ML injection 4 mg (4 mg Intravenous Given 07/21/22 0125)  morphine (PF) 4 MG/ML injection 4 mg (4 mg Intravenous Given 07/21/22 9924)    ED Course/ Medical Decision Making/ A&P                           Medical Decision Making Amount and/or Complexity of Data Reviewed Labs: ordered. Radiology: ordered.   This patient is a 86 y.o. male who presents to the ED for concern of fall, left hip pain, inability to ambulate, this involves an extensive number of treatment options, and is a complaint that carries with it a high risk of complications and morbidity. The emergent differential diagnosis prior to evaluation includes, but is not limited to, hip fracture, other fractures or injuries, hematoma, hip dislocation, versus other nonsurgical musculoskeletal injury.  Considered causes for a nonmechanical fall, however patient history is consistent with mechanical fall at this time.  This is not an exhaustive differential.   Past Medical History / Co-morbidities / Social History: Hyperlipidemia, hypertension, alcoholic cardiomyopathy, persistent A-fib currently on Eliquis, diabetes, CHF   Additional history: Chart reviewed. Pertinent results include: Reviewed outpatient cardiology notes, recent admission for acute on  chronic heart failure including lab work, imaging  Physical Exam: Physical exam performed. The pertinent findings include: Patient with decreased strength, significant pain in the left hip, and is unable to ambulate secondary to pain  Lab Tests: I ordered, and personally interpreted labs.  The pertinent results include: CBC overall unremarkable, his BMP with mild hyperglycemia, glucose 89,  he does have a mild anion gap of unclear significance, may be secondary to the trauma, no evidence of developing DKA, no recent toxic ingestion, no recent nausea, vomiting   Imaging Studies: I ordered imaging studies including plain films of the right foot, chest, left hip, CT of the head, C-spine, left hip. I independently visualized and interpreted imaging which showed some degenerative changes of the foot, no intrathoracic abnormality, no intracranial abnormality other than noted previous stroke, left hip with superior, inferior pubic rami fracture as well as acetabular fracture, not significantly displaced. I agree with the radiologist interpretation.   Cardiac Monitoring:  The patient was maintained on a cardiac monitor.  My attending physician Dr. Dayna Barker viewed and interpreted the cardiac monitored which showed an underlying rhythm of: Afib. I agree with this interpretation.   Medications: I ordered medication including morphine for pain.  Patient with some improvement of pain but still requiring pain medication at this time, and unable to ambulate.  Consultations Obtained: I requested consultation with the orthopedic surgeon, spoke with Dr. Stann Mainland, hospitalist, spoke with Dr. Marlowe Sax,  and discussed lab and imaging findings as well as pertinent plan - they recommend: Likely not to be a surgical case, encourage walker, early ambulation, however patient will need PT evaluation, pain control, and hospital admission given his advanced age and new hip fracture.   Disposition: After consideration of the  diagnostic results and the patients response to treatment, I feel that benefit from admission as discussed above.   I discussed this case with my attending physician Dr. Dayna Barker who cosigned this note including patient's presenting symptoms, physical exam, and planned diagnostics and interventions. Attending physician stated agreement with plan or made changes to plan which were implemented.    Final Clinical Impression(s) / ED Diagnoses Final diagnoses:  Closed nondisplaced fracture of posterior wall of left acetabulum, initial encounter [facility])    Rx / DC Orders ED Discharge Orders     None         Dorien Chihuahua 07/21/22 0355    Merrily Pew, MD 07/21/22 [phone]

## 2022-07-22 DIAGNOSIS — S32592A Other specified fracture of left pubis, initial encounter for closed fracture: Secondary | ICD-10-CM | POA: Diagnosis not present

## 2022-07-22 DIAGNOSIS — S32591A Other specified fracture of right pubis, initial encounter for closed fracture: Secondary | ICD-10-CM

## 2022-07-22 LAB — BASIC METABOLIC PANEL
Anion gap: 8 (ref 5–15)
BUN: 10 mg/dL (ref 8–23)
CO2: 27 mmol/L (ref 22–32)
Calcium: 8.4 mg/dL — ABNORMAL LOW (ref 8.9–10.3)
Chloride: 103 mmol/L (ref 98–111)
Creatinine, Ser: 0.98 mg/dL (ref 0.61–1.24)
GFR, Estimated: >60 mL/min (ref >60)
Glucose, Bld: 119 mg/dL — ABNORMAL HIGH (ref 70–99)
Potassium: 3.6 mmol/L (ref 3.5–5.1)
Sodium: 138 mmol/L (ref 135–145)

## 2022-07-22 LAB — CBC
HCT: 32.1 % — ABNORMAL LOW (ref 39.0–52.0)
Hemoglobin: 10 g/dL — ABNORMAL LOW (ref 13.0–17.0)
MCH: 26.5 pg (ref 26.0–34.0)
MCHC: 31.2 g/dL (ref 30.0–36.0)
MCV: 85.1 fL (ref 80.0–100.0)
Platelets: 152 10*3/uL (ref 150–400)
RBC: 3.77 MIL/uL — ABNORMAL LOW (ref 4.22–5.81)
RDW: 18.8 % — ABNORMAL HIGH (ref 11.5–15.5)
WBC: 4.8 10*3/uL (ref 4.0–10.5)
nRBC: 0 % (ref 0.0–0.2)

## 2022-07-22 LAB — MAGNESIUM: Magnesium: 1.8 mg/dL (ref 1.7–2.4)

## 2022-07-22 MED ORDER — MAGNESIUM SULFATE 2 GM/50ML IV SOLN
2.0000 g | Freq: Once | INTRAVENOUS | Status: AC
  Administered 2022-07-22: 2 g via INTRAVENOUS
  Filled 2022-07-22: qty 50

## 2022-07-22 NOTE — Consult Note (Signed)
[facility] Nurse Consult Note: Reason for Consult:LE wounds Patient is follow by the wound care center since 2022, per notes healed in March 2023 but was readmitted Aug. 2023 with bilateral LE wounds again. Poor edema control/compliance with needed compression  Wound type: Venous stasis in the presence of DM/lymphedema  Pressure Injury POA: NA Measurement: 07/18/22 seen in [facility]; Dr. Celine Ahr RLE circumferential wound;; LLE lateral and medial Wound bed: wounds are noted in her last visit to be pink and mostly clean Drainage (amount, consistency, odor) non purulent  Periwound: edema Dressing procedure/placement/frequency: Cleanse bilateral LEs with saline, pat dry Cut to fit silver hydrofiber (Aquacel Ag+) and place over open wounds, top with ABD pads, secure with kerlix from toes to knees followed by 4" Coban from toes to knees. Change 2x week beginning 07/22/22 and every Tues/Friday per bedside nursing  Follow up with wound care center as scheduled. Resume HHRN if in place PTA.  Re consult if needed, will not follow at this time. Thanks  Kayton Dunaj [employer], RN,CWOCN, CNS, [place] [phone])

## 2022-07-22 NOTE — Progress Notes (Addendum)
Contacted by central tele with reports of 7 beat run of SVT. Patient has an extensive cardiac history with noted history of SVT and non sustained Vtach. Currently in AFib, informed NP on call. Midnight labs results consistent with previous labs obtained during the day

## 2022-07-22 NOTE — Progress Notes (Addendum)
[facility] Progress Note  Patient: Vincent [name]     ZOX:[id number]  DOA: 07/20/2022   PCP: [name], MD       Brief hospital course: This is an 86 year old male with congestive heart failure, atrial fibrillation, coronary artery disease status post PCI, diabetes mellitus, CVA who presents to the hospital after a fall.  He states that he was trying to climb the stairs when he lost his balance and fell. In the ED, he was noted to have superior and inferior pubic rami fractures and fracture of the posterior wall of the acetabulum.  Per orthopedic surgery, there is no need for surgery at this time.  We are managing him with pain medications and awaiting a PT OT evaluation.  Subjective:  He has pain in his left hip/pelvic area when he tries to move his left leg.  Assessment and Plan: Principal Problem:   Bilateral pubic rami fractures ([place]) Fall   Acetabular fracture ([place]) -Follow-up on PT eval and continue pain control medications  Active Problems:      Long term current use of anticoagulant   Permanent atrial fibrillation (HCC) Chronic systolic heart failure -Continue Eliquis, carvedilol, digoxin, Entresto, isosorbide mononitrate, spironolactone and Lasix    Normocytic anemia    Controlled type 2 diabetes mellitus without complication, without long-term current use of insulin ([place]) -Continue Jardiance         Code Status: Full Code DVT prophylaxis:   apixaban (ELIQUIS) tablet 5 mg    Consultants: orthopedic surgery Level of Care: Level of care: Telemetry Medical Total time on patient care: 35   Objective:   Vitals:   07/21/22 2000 07/21/22 2012 07/22/22 0427 07/22/22 0908  BP:  (!) 139/92    Pulse:    73  Resp:      Temp:  98 F (36.7 C)    TempSrc:  Tympanic    SpO2: 100% 100%    Weight:   85.2 kg   Height:       Filed Weights   07/20/22 2140 07/21/22 1345 07/22/22 0427  Weight: 81.6 kg 85.8 kg 85.2 kg   Exam: General exam: Appears  comfortable  HEENT: oral mucosa moist Respiratory system: Clear to auscultation.  Cardiovascular system: S1 & S2 heard  Gastrointestinal system: Abdomen soft, non-tender, nondistended. Normal bowel sounds   Extremities: No cyanosis, clubbing or edema Psychiatry:  Mood & affect appropriate.      CBC: Recent Labs  Lab 07/21/22 0053 07/21/22 0830 07/22/22 0019  WBC 7.7  --  4.8  HGB 11.8* 10.6* 10.0*  HCT 37.9* 34.4* 32.1*  MCV 87.3  --  85.1  PLT 159  --  811   Basic Metabolic Panel: Recent Labs  Lab 07/21/22 0053 07/22/22 0019  NA 141 138  K 3.7 3.6  CL 102 103  CO2 22 27  GLUCOSE 159* 119*  BUN 14 10  CREATININE 1.02 0.98  CALCIUM 9.2 8.4*  MG  --  1.8   GFR: Estimated Creatinine Clearance: 60.7 mL/min (by C-G formula based on SCr of 0.98 mg/dL).  Scheduled Meds:  apixaban  5 mg Oral BID   atorvastatin  80 mg Oral Daily   brimonidine  1 drop Both Eyes BID   carvedilol  6.25 mg Oral BID WC   digoxin  0.125 mg Oral Daily   empagliflozin  25 mg Oral Daily   famotidine  20 mg Oral Daily   furosemide  40 mg Oral Daily   isosorbide mononitrate  30 mg Oral Daily   latanoprost  1 drop Both Eyes QHS   loratadine  10 mg Oral Daily   sacubitril-valsartan  1 tablet Oral BID   sodium chloride flush  3 mL Intravenous Q12H   spironolactone  25 mg Oral Daily   Continuous Infusions: Imaging and lab data was personally reviewed CT Hip Left Wo Contrast  Result Date: 07/21/2022 CLINICAL DATA:  Hip trauma, fracture suspected. EXAM: CT OF THE LEFT HIP WITHOUT CONTRAST TECHNIQUE: Multidetector CT imaging of the left hip was performed according to the standard protocol. Multiplanar CT image reconstructions were also generated. RADIATION DOSE REDUCTION: This exam was performed according to the departmental dose-optimization program which includes automated exposure control, adjustment of the mA and/or kV according to patient size and/or use of iterative reconstruction technique.  COMPARISON:  07/20/2022. FINDINGS: Bones/Joint/Cartilage There is a minimally displaced fracture of the inferior pubic ramus on the left. There is a minimally displaced fracture of the left superior pubic ramus extending into the anterior column of the acetabulum. There is a nondisplaced fracture of the posterior wall of the acetabulum. The remaining bony structures are intact. No dislocation. Degenerative changes are noted at the left hip and in the lower lumbar spine. Ligaments Suboptimally assessed by CT. Muscles and Tendons No intramuscular hematoma. Soft tissues Atherosclerotic calcification of the aorta. Anasarca is noted. Colonic diverticulosis without diverticulitis. A small amount of blood products are noted along the lateral pelvis wall on the left in the region of pelvic fracture. Fluid attenuation is noted in the scrotum, possible hydrocele. IMPRESSION: 1. Minimally displaced fracture of the superior pubic ramus extending into the acetabulum and anterior column on the left. 2. Nondisplaced fracture of the posterior wall of the acetabulum. 3. Mildly displaced fracture of the inferior pubic ramus on the left. 4. Strandy hemorrhage along the lateral pelvic wall on the left. 5. Aortic atherosclerosis. Electronically Signed   By: Brett Fairy M.D.   On: 07/21/2022 02:37   CT Cervical Spine Wo Contrast  Result Date: 07/20/2022 CLINICAL DATA:  Trauma. EXAM: CT CERVICAL SPINE WITHOUT CONTRAST TECHNIQUE: Multidetector CT imaging of the cervical spine was performed without intravenous contrast. Multiplanar CT image reconstructions were also generated. RADIATION DOSE REDUCTION: This exam was performed according to the departmental dose-optimization program which includes automated exposure control, adjustment of the mA and/or kV according to patient size and/or use of iterative reconstruction technique. COMPARISON:  None Available. FINDINGS: Alignment: Normal. Skull base and vertebrae: No acute fracture. No  primary bone lesion or focal pathologic process. Soft tissues and spinal canal: No prevertebral fluid or swelling. No visible canal hematoma. Disc levels: There is disc space narrowing and endplate osteophyte formation at C3-C4, C4-C5, C5-C6 and C6-C7 compatible with degenerative change. There is mild-to-moderate neural foraminal stenosis on the left at these levels with mild multilevel central canal stenosis. Upper chest: There is a small right pleural effusion. Other: There are atherosclerotic calcifications of the aorta. IMPRESSION: 1. No acute fracture or traumatic subluxation of the cervical spine. 2. Multilevel degenerative changes of the cervical spine. 3. Small right pleural effusion. Aortic Atherosclerosis (ICD10-I70.0). Electronically Signed   By: Ronney Asters M.D.   On: 07/20/2022 23:17   CT Head Wo Contrast  Result Date: 07/20/2022 CLINICAL DATA:  Head trauma EXAM: CT HEAD WITHOUT CONTRAST TECHNIQUE: Contiguous axial images were obtained from the base of the skull through the vertex without intravenous contrast. RADIATION DOSE REDUCTION: This exam was performed according to the departmental dose-optimization program which  includes automated exposure control, adjustment of the mA and/or kV according to patient size and/or use of iterative reconstruction technique. COMPARISON:  CT head 06/10/2022.  MRI brain 03/19/2014. FINDINGS: Brain: There is no acute infarct, acute intracranial hemorrhage, extra-axial fluid collection, mass effect or midline shift. Large old right MCA distribution infarct appears unchanged from prior. Old bilateral cerebellar infarcts appear unchanged from prior. No hydrocephalus. Mild diffuse atrophy is stable. Vascular: Atherosclerotic calcifications are present within the cavernous internal carotid arteries. Skull: Normal. Negative for fracture or focal lesion. Sinuses/Orbits: No acute finding. Other: None. IMPRESSION: 1. No acute intracranial process. 2. Unchanged old right  MCA distribution infarct and old bilateral cerebellar infarcts. Electronically Signed   By: Ronney Asters M.D.   On: 07/20/2022 23:06   DG Foot Complete Right  Result Date: 07/20/2022 CLINICAL DATA:  Left foot pain after a fall. EXAM: RIGHT FOOT COMPLETE - 3+ VIEW COMPARISON:  None Available. FINDINGS: Degenerative changes in the interphalangeal and metatarsal-phalangeal joints. Left hallux valgus deformity. No evidence of acute fracture or dislocation. Dorsal soft tissue swelling. Vascular calcifications. IMPRESSION: Degenerative changes in the left foot with hallux valgus deformity. No acute displaced fractures identified. Dorsal soft tissue swelling. Electronically Signed   By: Lucienne Capers M.D.   On: 07/20/2022 22:36   DG Chest 2 View  Result Date: 07/20/2022 CLINICAL DATA:  Left hip and foot pain after a fall. EXAM: CHEST - 2 VIEW COMPARISON:  06/11/2022 FINDINGS: Loop recorder is present. Shallow inspiration. Heart size is enlarged. No vascular congestion, edema, or consolidation. Small bilateral pleural effusions are suggested. No pneumothorax. Visualized ribs are nondepressed. Degenerative changes in the spine and shoulders. IMPRESSION: Cardiac enlargement with small bilateral pleural effusions. Electronically Signed   By: Lucienne Capers M.D.   On: 07/20/2022 22:34   DG Hip Unilat W or Wo Pelvis 2-3 Views Left  Result Date: 07/20/2022 CLINICAL DATA:  Left hip and foot pain after a fall. EXAM: DG HIP (WITH OR WITHOUT PELVIS) 2-3V LEFT COMPARISON:  None Available. FINDINGS: Acute mildly displaced fractures identified in the left superior and inferior pubic rami with superior ramus fracture extending to the base of the acetabulum. No fracture or dislocation suggested in the hip joint. SI joints and symphysis pubis are not displaced. Visualized sacrum appears intact. Vascular calcifications. IMPRESSION: Fracture of the left superior and inferior pubic rami extending to the central  acetabulum. Electronically Signed   By: Lucienne Capers M.D.   On: 07/20/2022 22:32    LOS: 0 days   Author: Debbe Odea  07/22/2022 11:54 AM  To contact [facility]>   Check the care team in [facility] and look for the attending/consulting [place] provider listed  Log into [email or web address] and use [place]'s universal password   Go to> "[facility]"  and find provider  If you still have difficulty reaching the provider, please page the [facility] (Director on Call) for the Hospitalists listed on amion

## 2022-07-22 NOTE — Evaluation (Signed)
Physical Therapy Evaluation  Patient Details Name: Vincent [name] MRN: [id number] DOB: 1936-04-16 Today's Date: 07/22/2022  History of Present Illness  86 yo male s/p fall with L hip. CT () displaced superior pubic ramus extending into the acetabulum and anterior column on the L. Non displaced fx of posterior wall of acetabulum, mildly displaced fx of the inferior pubic ramus on L. Strandy hemorrhage along the lateral pelvic wall on L. Per Dr. Sheela Stack non operative. PMH HF with reduced EF, permanent ARIB on anticoagulation, CAD s/p PCI to RCA, DM, CVA with residual L weakness, L drop foot , chronic wounds,gluac[place]   Clinical Impression  Pt admitted with above diagnosis. Pt currently with functional limitations due to the deficits listed below (see PT Problem List). At the time of PT eval pt was able to perform transfers with up to +2 mod assist. Pt motivated to participate, but limited by pain. Feel this patient would benefit from continued multidisciplinary rehab at the SNF level, to decrease risk for falls and maximize functional independence and safety prior to return home with sister's support. Acutely, pt will benefit from skilled PT to increase their independence and safety with mobility to allow discharge to the venue listed below.          Recommendations for follow up therapy are one component of a multi-disciplinary discharge planning process, led by the attending physician.  Recommendations may be updated based on patient status, additional functional criteria and insurance authorization.  Follow Up Recommendations Skilled nursing-short term rehab (<3 hours/day) Can patient physically be transported by private vehicle: No    Assistance Recommended at Discharge Frequent or constant Supervision/Assistance  Patient can return home with the following  Two people to help with walking and/or transfers;A lot of help with bathing/dressing/bathroom;Assistance with cooking/housework;Assist  for transportation;Help with stairs or ramp for entrance    Equipment Recommendations Rolling walker (2 wheels);BSC/3in1 (TBD by next venue of care)  Recommendations for Other Services       Functional Status Assessment Patient has had a recent decline in their functional status and demonstrates the ability to make significant improvements in function in a reasonable and predictable amount of time.     Precautions / Restrictions Precautions Precautions: Fall Precaution Comments: incontinence of bladder- recommend brief Restrictions Weight Bearing Restrictions: Yes LLE Weight Bearing: Weight bearing as tolerated      Mobility  Bed Mobility Overal bed mobility: Needs Assistance Bed Mobility: Supine to Sit, Sit to Supine     Supine to sit: Mod assist, HOB elevated     General bed mobility comments: Segmental advancement of LE's towards EOB and encouraged pt to keep LE's close together for pain control.    Transfers Overall transfer level: Needs assistance Equipment used: Rolling walker (2 wheels) Transfers: Sit to/from Stand, Bed to chair/wheelchair/BSC Sit to Stand: +2 physical assistance, Mod assist, From elevated surface   Step pivot transfers: Mod assist, +2 physical assistance       General transfer comment: pt with heavy anterior lean onto the RW with hip flexion to stand from elevated surface. pt pulling with BIL UE despite vc to place R LE on bed surface. pt achieved full upright posture in RW with weight shift onto L LE. Pt with increased difficulty to advance RLE. Chair was brought up behind pt as he was having increased pain and unable to complete step pivot.    Ambulation/Gait               General  Gait Details: Unable to progress to gait training at this time.  Stairs            Wheelchair Mobility    Modified [name] (Stroke Patients Only)       Balance Overall balance assessment: Needs assistance Sitting-balance support: Bilateral upper  extremity supported, Feet supported Sitting balance-Leahy Scale: Fair     Standing balance support: Bilateral upper extremity supported, During functional activity, Reliant on assistive device for balance Standing balance-Leahy Scale: Poor                               Pertinent Vitals/Pain Pain Assessment Pain Assessment: Faces Faces Pain Scale: Hurts even more Pain Location: R LE Pain Descriptors / Indicators: Discomfort, Grimacing, Guarding Pain Intervention(s): Limited activity within patient's tolerance, Monitored during session, Repositioned    Home Living Family/patient expects to be discharged to:: Private residence Living Arrangements: Alone Available Help at Discharge: Available PRN/intermittently Type of Home: House Home Access: Stairs to enter Entrance Stairs-Rails: None Entrance Stairs-Number of Steps: 3 (front door is normal entrance. he has 2 on the back door)   Home Layout: One level Home Equipment: Kasandra Knudsen - single point Additional Comments: [place] - by car 5 minutes and walking 15 minutes apart. Sister drives. Pt manages all medical care.    Prior Function Prior Level of Function : Independent/Modified Independent             Mobility Comments: the cane and walker were given to sister and he has access but they are not the patients. 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 email and phone contacts. Sister reports pt stopped driving due to vision but has a full license if he wanted to drive     Hand Dominance   Dominant Hand: Right    Extremity/Trunk Assessment   Upper Extremity Assessment Upper Extremity Assessment: Overall WFL for tasks assessed    Lower Extremity Assessment Lower Extremity Assessment: RLE deficits/detail;LLE deficits/detail;Generalized weakness RLE Deficits / Details: Bilaterally limited due to pain. Pt reports L more painful than R. RLE:  Unable to fully assess due to pain    Cervical / Trunk Assessment Cervical / Trunk Assessment: Normal (Forward head posture with rounded shoulders)  Communication   Communication: No difficulties  Cognition Arousal/Alertness: Awake/alert Behavior During Therapy: Flat affect Overall Cognitive Status: Impaired/Different from baseline Area of Impairment: Awareness, Following commands                       Following Commands: Follows multi-step commands inconsistently, Follows multi-step commands with increased time   Awareness: Emergent            General Comments      Exercises General Exercises - Lower Extremity Heel Slides: 5 reps, AROM, Right, Left   Assessment/Plan    PT Assessment Patient needs continued PT services  PT Problem List Decreased strength;Decreased activity tolerance;Decreased balance;Decreased mobility;Decreased cognition;Decreased safety awareness;Decreased knowledge of use of DME;Decreased knowledge of precautions;Pain       PT Treatment Interventions DME instruction;Gait training;Stair training;Functional mobility training;Therapeutic activities;Therapeutic exercise;Balance training;Cognitive remediation;Patient/family education    PT Goals (Current goals can be found in the Care Plan section)  Acute Rehab PT Goals Patient Stated Goal: Progress and be able to go home again PT Goal Formulation: With patient Time For Goal Achievement: 08/05/22 Potential to Achieve Goals: Good  Frequency Min 3X/week     Co-evaluation PT/OT/SLP Co-Evaluation/Treatment: Yes Reason for Co-Treatment: Complexity of the patient's impairments (multi-system involvement);Necessary to address cognition/behavior during functional activity;For patient/therapist safety;To address functional/ADL transfers PT goals addressed during session: Mobility/safety with mobility;Balance;Proper use of DME;Strengthening/ROM         AM-PAC PT "6 Clicks" Mobility  Outcome  Measure Help needed turning from your back to your side while in a flat bed without using bedrails?: A Lot Help needed moving from lying on your back to sitting on the side of a flat bed without using bedrails?: A Lot Help needed moving to and from a bed to a chair (including a wheelchair)?: Total Help needed standing up from a chair using your arms (e.g., wheelchair or bedside chair)?: Total Help needed to walk in hospital room?: Total Help needed climbing 3-5 steps with a railing? : Total 6 Click Score: 8    End of Session Equipment Utilized During Treatment: Gait belt Activity Tolerance: Patient limited by pain Patient left: with call bell/phone within reach;with chair alarm set;in chair Nurse Communication: Mobility status (Pt reports has not eaten breakfast) PT Visit Diagnosis: Unsteadiness on feet (R26.81);Pain Pain - Right/Left: Left (bilateral) Pain - part of body: Hip (pelvis)    Time: 3614-4315 PT Time Calculation (min) (ACUTE ONLY): 43 min   Charges:   PT Evaluation $PT Eval Moderate Complexity: 1 Mod PT Treatments $Gait Training: 8-22 mins        Rolinda Roan, PT, DPT Acute Rehabilitation Services Secure Chat Preferred Office: [phone]   Thelma Comp 07/22/2022, 1:28 PM

## 2022-07-22 NOTE — Evaluation (Signed)
Occupational Therapy Evaluation Patient Details Name: Vincent [name] MRN: [id number] DOB: [birth year] Today's Date: 07/22/2022   History of Present Illness 86 yo male s/p fall with L hip. CT () displaced superior pubic ramus extending into the acetabulum and anterior column on the L. Non displaced fx of posterior wall of acetabulum, mildly displaced fx of the inferior pubic ramus on L. Strandy hemorrhage along the lateral pelvic wall on L. Per Dr. Sheela Stack non operative. PMH HF with reduced EF, permanent ARIB on anticoagulation, CAD s/p PCI to RCA, DM, CVA with residual L weakness, L drop foot , chronic wounds,[place]   Clinical Impression   PT admitted with BIL superior pubic ramus and L pelvic wall lateral hemorrhage. Pt currently with functional limitiations due to the deficits listed below (see OT problem list). Pt demonstrates ability to (A) with bed mobility with heavy cues and use of bed rail. Pt with elevated bed surface and RW able to power up into standing. Pt advanced a few steps forward but requires chair placed behind the patient. Pt with bed swung away and able to sit into the chair. Pt with elevated surface to help with sit<>Stand.  Pt will benefit from skilled OT to increase their independence and safety with adls and balance to allow discharge SNF *(requesting [facility] due to close to home and likely to have insurance approve per pt stating).       Recommendations for follow up therapy are one component of a multi-disciplinary discharge planning process, led by the attending physician.  Recommendations may be updated based on patient status, additional functional criteria and insurance authorization.   Follow Up Recommendations  Skilled nursing-short term rehab (<3 hours/day)     Assistance Recommended at Discharge Intermittent Supervision/Assistance  Patient can return home with the following Two people to help with walking and/or transfers;Two people to help with  bathing/dressing/bathroom;Assist for transportation    Functional Status Assessment  Patient has had a recent decline in their functional status and demonstrates the ability to make significant improvements in function in a reasonable and predictable amount of time.  Equipment Recommendations  Wheelchair (measurements OT);Wheelchair cushion (measurements OT);BSC/3in1;Other (comment) (RW)    Recommendations for Other Services       Precautions / Restrictions Precautions Precautions: Fall Precaution Comments: incontinence of bladder- recommend brief Restrictions Weight Bearing Restrictions: Yes LLE Weight Bearing: Weight bearing as tolerated      Mobility Bed Mobility Overal bed mobility: Needs Assistance Bed Mobility: Supine to Sit, Sit to Supine     Supine to sit: Mod assist, HOB elevated     General bed mobility comments: pt educated on knee extension to abduct L LE toward EOB and then add R LE toward it in small movements to keep hips shoulder width at widest. pt able to bring bil LE toward EOB with mod cues for sequence. pt states "i am not sure what i am suppose to do several times showing decr recall." pt once EOB using bed rail to pull trunk toward EOB and pad pulled by OT to pivot hips MOD (A) toward EOB sitting. pt static sitting min guard on elevated surface. pt holding with L UE    Transfers Overall transfer level: Needs assistance Equipment used: Rolling walker (2 wheels) Transfers: Sit to/from Stand Sit to Stand: +2 physical assistance, Mod assist, From elevated surface           General transfer comment: pt with heavy anterior lean onto the RW with hip flexion to stand  from elevated surface. pt pulling with BIL UE despite vc to place R LE on bed surface. pt achieved full upright posture in RW with weight shift onto L LE.      Balance Overall balance assessment: Needs assistance Sitting-balance support: Bilateral upper extremity supported, Feet  supported Sitting balance-Leahy Scale: Fair     Standing balance support: Bilateral upper extremity supported, During functional activity, Reliant on assistive device for balance Standing balance-Leahy Scale: Poor                             ADL either performed or assessed with clinical judgement   ADL Overall ADL's : Needs assistance/impaired Eating/Feeding: Set up;Bed level   Grooming: Set up;Sitting   Upper Body Bathing: Moderate assistance;Bed level   Lower Body Bathing: Maximal assistance;Bed level   Upper Body Dressing : Moderate assistance;Sitting   Lower Body Dressing: Maximal assistance;Sit to/from stand;+2 for physical assistance;+2 for safety/equipment   Toilet Transfer: +2 for physical assistance;Moderate assistance (sit <>stand with chair placed behind pt. pt just sit<>Stand with a few step advancement foward. pt was unable to pivot)             General ADL Comments: pt oob to chair and requires elevated surface     Vision Baseline Vision/History: 1 Wears glasses;3 Glaucoma       Perception     Praxis      Pertinent Vitals/Pain Pain Assessment Pain Assessment: Faces Faces Pain Scale: Hurts even more Pain Location: R LE Pain Descriptors / Indicators: Discomfort, Grimacing, Guarding Pain Intervention(s): Monitored during session, Limited activity within patient's tolerance, Premedicated before session, Repositioned (pt reports no pain in sitting and la\ying. Only in standing with weight bearing)     Hand Dominance Right   Extremity/Trunk Assessment Upper Extremity Assessment Upper Extremity Assessment: Overall WFL for tasks assessed   Lower Extremity Assessment Lower Extremity Assessment: Defer to PT evaluation   Cervical / Trunk Assessment Cervical / Trunk Assessment: Normal   Communication Communication Communication: No difficulties   Cognition Arousal/Alertness: Awake/alert Behavior During Therapy: Flat affect Overall  Cognitive Status: Impaired/Different from baseline Area of Impairment: Awareness, Following commands                       Following Commands: Follows multi-step commands inconsistently, Follows multi-step commands with increased time   Awareness: Emergent   General Comments: pt initially verbalized to PT to decline OT due to not wanting to do peg task. pt showing some recall of prior SNF level OT care for stroke. OT calling sister to verify home setup.     General Comments  noted to have edema at scrotum and leaking primafit so increased risk for skin break down. pt will benefit from frequent bed repositioning    Exercises     Shoulder Instructions      Home Living Family/patient expects to be discharged to:: Private residence Living Arrangements: Alone Available Help at Discharge: Available PRN/intermittently Type of Home: House Home Access: Stairs to enter [employer] of Steps: 3 (front door is normal entrance. he has 2 on the back door) Entrance Stairs-Rails: None Home Layout: One level     Bathroom Shower/Tub: Teacher, early years/pre: Standard     Home Equipment: Kasandra Knudsen - single point   Additional Comments: [place] - by car 5 minutes and walking 15 minutes apart. Sister drives. Pt manages all medical care.  Prior Functioning/Environment Prior Level of Function : Independent/Modified Independent             Mobility Comments: the cane and walker were given to sister and he has access but they are not the patients. 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 email and phone contacts. Sister reports pt stopped driving due to vision but has a full license if he wanted to drive        OT Problem List: Decreased strength;Decreased activity tolerance;Impaired balance (sitting and/or standing);Decreased cognition;Decreased safety awareness;Decreased knowledge  of use of DME or AE;Decreased knowledge of precautions;Pain;Increased edema;Impaired vision/perception      OT Treatment/Interventions: Self-care/ADL training;Therapeutic exercise;Energy conservation;DME and/or AE instruction;Manual therapy;Modalities;Therapeutic activities;Cognitive remediation/compensation;Visual/perceptual remediation/compensation;Patient/family education;Balance training    OT Goals(Current goals can be found in the care plan section) Acute Rehab OT Goals Patient Stated Goal: agreeable to therapy and requesting [facility] being closest to his home OT Goal Formulation: With patient Time For Goal Achievement: 08/05/22 Potential to Achieve Goals: Good  OT Frequency: Min 2X/week    Co-evaluation PT/OT/SLP Co-Evaluation/Treatment: Yes Reason for Co-Treatment: Necessary to address cognition/behavior during functional activity;For patient/therapist safety;To address functional/ADL transfers   OT goals addressed during session: ADL's and self-care;Proper use of Adaptive equipment and DME;Strengthening/ROM      AM-PAC OT "6 Clicks" Daily Activity     Outcome Measure Help from another person eating meals?: A Little Help from another person taking care of personal grooming?: A Little Help from another person toileting, which includes using toliet, bedpan, or urinal?: A Lot Help from another person bathing (including washing, rinsing, drying)?: A Lot Help from another person to put on and taking off regular upper body clothing?: A Lot Help from another person to put on and taking off regular lower body clothing?: A Lot 6 Click Score: 14   End of Session Equipment Utilized During Treatment: Gait belt;Rolling walker (2 wheels);Oxygen (on 3L on arrival. Pt demonstrates 89% on RA) Nurse Communication: Mobility status;Precautions;Weight bearing status  Activity Tolerance: Patient tolerated treatment well Patient left: in chair;with call bell/phone within reach;with chair alarm set  (elevated surface with blankets)  OT Visit Diagnosis: Unsteadiness on feet (R26.81);Muscle weakness (generalized) (M62.81);Pain Pain - Right/Left: Right Pain - part of body: Leg;Hip                Time: 9379-0240 OT Time Calculation (min): 43 min Charges:  OT General Charges $OT Visit: 1 Visit OT Evaluation $OT Eval Moderate Complexity: 1 Mod OT Treatments $Self Care/Home Management : 8-22 mins   Brynn, OTR/L  Acute Rehabilitation Services Office: [phone] .   [name] 07/22/2022, 11:42 AM

## 2022-07-22 NOTE — TOC CAGE-AID Note (Signed)
Transition of Care [facility]) - CAGE-AID Screening   Patient Details  Name: DENYS SALINGER MRN: [id number] Date of Birth: 1935/12/24  Transition of Care [facility]) CM/SW Contact:    Army Melia, RN Phone [phone] 07/22/2022, 4:34 AM   Clinical Narrative:  No hx of drug/alcohol use, no resources indicated.  CAGE-AID Screening:    Have You Ever Felt You Ought to Cut Down on Your Drinking or Drug Use?: No Have People Annoyed You By Critizing Your Drinking Or Drug Use?: No Have You Felt Bad Or Guilty About Your Drinking Or Drug Use?: No Have You Ever Had a Drink or Used Drugs First Thing In The Morning to Steady Your Nerves or to Get Rid of a Hangover?: No CAGE-AID Score: 0  Substance Abuse Education Offered: No

## 2022-07-23 DIAGNOSIS — S32591A Other specified fracture of right pubis, initial encounter for closed fracture: Secondary | ICD-10-CM | POA: Diagnosis not present

## 2022-07-23 DIAGNOSIS — S32592A Other specified fracture of left pubis, initial encounter for closed fracture: Secondary | ICD-10-CM | POA: Diagnosis not present

## 2022-07-23 LAB — COMPREHENSIVE METABOLIC PANEL
ALT: 19 U/L (ref 0–44)
AST: 20 U/L (ref 15–41)
Albumin: 2.8 g/dL — ABNORMAL LOW (ref 3.5–5.0)
Alkaline Phosphatase: 67 U/L (ref 38–126)
Anion gap: 9 (ref 5–15)
BUN: 12 mg/dL (ref 8–23)
CO2: 30 mmol/L (ref 22–32)
Calcium: 8.8 mg/dL — ABNORMAL LOW (ref 8.9–10.3)
Chloride: 100 mmol/L (ref 98–111)
Creatinine, Ser: 0.99 mg/dL (ref 0.61–1.24)
GFR, Estimated: >60 mL/min (ref >60)
Glucose, Bld: 129 mg/dL — ABNORMAL HIGH (ref 70–99)
Potassium: 3.5 mmol/L (ref 3.5–5.1)
Sodium: 139 mmol/L (ref 135–145)
Total Bilirubin: 2.2 mg/dL — ABNORMAL HIGH (ref 0.3–1.2)
Total Protein: 6.1 g/dL — ABNORMAL LOW (ref 6.5–8.1)

## 2022-07-23 LAB — GLUCOSE, CAPILLARY
Glucose-Capillary: 160 mg/dL — ABNORMAL HIGH (ref 70–99)
Glucose-Capillary: 152 mg/dL — ABNORMAL HIGH (ref 70–99)
Glucose-Capillary: 126 mg/dL — ABNORMAL HIGH (ref 70–99)
Glucose-Capillary: 163 mg/dL — ABNORMAL HIGH (ref 70–99)
Glucose-Capillary: 105 mg/dL — ABNORMAL HIGH (ref 70–99)

## 2022-07-23 LAB — MAGNESIUM: Magnesium: 1.9 mg/dL (ref 1.7–2.4)

## 2022-07-23 MED ORDER — POLYETHYLENE GLYCOL 3350 17 G PO PACK
17.0000 g | PACK | Freq: Every day | ORAL | Status: DC | PRN
  Administered 2022-07-23: 17 g via ORAL
  Filled 2022-07-23: qty 1

## 2022-07-23 NOTE — Progress Notes (Signed)
[facility] Progress Note  Patient: Vincent [name]     TKP:[id number]  DOA: 07/20/2022   PCP: [name], MD       Brief hospital course: This is an 86 year old male with congestive heart failure, atrial fibrillation, coronary artery disease status post PCI, diabetes mellitus, CVA who presents to the hospital after a fall.  He states that he was trying to climb the stairs when he lost his balance and fell. In the ED, he was noted to have superior and inferior pubic rami fractures and fracture of the posterior wall of the acetabulum.  Per orthopedic surgery, there is no need for surgery at this time.  We are managing him with pain medications and awaiting a PT OT evaluation.  Subjective:  Continues to have pain in left hip/pelvis with movement.   Assessment and Plan: Principal Problem:   Bilateral pubic rami fractures ([place]) Fall   Acetabular fracture ([place]) - PT recommends SNF - cont pain control  Active Problems:      Long term current use of anticoagulant   Permanent atrial fibrillation (HCC) Chronic systolic heart failure -Continue Eliquis, carvedilol, digoxin, Entresto, isosorbide mononitrate, spironolactone and Lasix    Normocytic anemia - Hgb ~ 10    Controlled type 2 diabetes mellitus without complication, without long-term current use of insulin ([place]) -Continue Jardiance - he does not want insulin during the hospital stay- CBGs are resonably controlled - last A1c was 7.0          Code Status: Full Code DVT prophylaxis:   apixaban (ELIQUIS) tablet 5 mg    Consultants: orthopedic surgery Level of Care: Level of care: Telemetry Medical Total time on patient care: 35   Objective:   Vitals:   07/22/22 2122 07/23/22 0022 07/23/22 0745 07/23/22 0838  BP: 136/80 126/64  137/68  Pulse: 91 67  73  Resp:  18  18  Temp:    97.9 F (36.6 C)  TempSrc:    Oral  SpO2: 98%   98%  Weight:   84.2 kg   Height:       Filed Weights   07/21/22 1345 07/22/22  0427 07/23/22 0745  Weight: 85.8 kg 85.2 kg 84.2 kg   Exam: General exam: Appears comfortable  HEENT: oral mucosa moist Respiratory system: Clear to auscultation.  Cardiovascular system: S1 & S2 heard  Gastrointestinal system: Abdomen soft, non-tender, nondistended. Normal bowel sounds   Extremities: No cyanosis, clubbing or edema Psychiatry:  Mood & affect appropriate.      CBC: Recent Labs  Lab 07/21/22 0053 07/21/22 0830 07/22/22 0019  WBC 7.7  --  4.8  HGB 11.8* 10.6* 10.0*  HCT 37.9* 34.4* 32.1*  MCV 87.3  --  85.1  PLT 159  --  517    Basic Metabolic Panel: Recent Labs  Lab 07/21/22 0053 07/22/22 0019 07/23/22 0259  NA 141 138 139  K 3.7 3.6 3.5  CL 102 103 100  CO2 '22 27 30  '$ GLUCOSE 159* 119* 129*  BUN '14 10 12  '$ CREATININE 1.02 0.98 0.99  CALCIUM 9.2 8.4* 8.8*  MG  --  1.8 1.9    GFR: Estimated Creatinine Clearance: 55.5 mL/min (by C-G formula based on SCr of 0.99 mg/dL).  Scheduled Meds:  apixaban  5 mg Oral BID   atorvastatin  80 mg Oral Daily   brimonidine  1 drop Both Eyes BID   carvedilol  6.25 mg Oral BID WC   digoxin  0.125 mg Oral Daily  empagliflozin  25 mg Oral Daily   famotidine  20 mg Oral Daily   furosemide  40 mg Oral Daily   isosorbide mononitrate  30 mg Oral Daily   latanoprost  1 drop Both Eyes QHS   loratadine  10 mg Oral Daily   sacubitril-valsartan  1 tablet Oral BID   sodium chloride flush  3 mL Intravenous Q12H   spironolactone  25 mg Oral Daily   Continuous Infusions: Imaging and lab data was personally reviewed No results found.  LOS: 0 days   Author: Debbe Odea  07/23/2022 11:08 AM  To contact [facility]>   Check the care team in [facility] and look for the attending/consulting [place] provider listed  Log into [email or web address] and use [place]'s universal password   Go to> "[facility]"  and find provider  If you still have difficulty reaching the provider, please page the [facility] (Director on Call) for  the Hospitalists listed on amion

## 2022-07-23 NOTE — TOC Initial Note (Signed)
Transition of Care [facility]) - Initial/Assessment Note    Patient Details  Name: Vincent [name] MRN: [id number] Date of Birth: 05-12-1936  Transition of Care [facility]) CM/SW Contact:    Ina Homes, [place] Phone Number: 07/23/2022, 3:38 PM  Clinical Narrative:                  SW attempted to meet with pt but currently with PT. SW will f/u PT recs and discuss with pt.     Expected Discharge Plan: Skilled Nursing Facility Barriers to Discharge: Ship broker, SNF Pending bed offer   Patient Goals and CMS Choice        Expected Discharge Plan and Services Expected Discharge Plan: [place] arrangements for the past 2 months: Single Family Home                                      Prior Living Arrangements/Services Living arrangements for the past 2 months: Single Family Home Lives with:: Self                   Activities of Daily Living Home Assistive Devices/Equipment: CBG Meter ADL Screening (condition at time of admission) Patient's cognitive ability adequate to safely complete daily activities?: Yes Is the patient deaf or have difficulty hearing?: No Does the patient have difficulty seeing, even when wearing glasses/contacts?: No Does the patient have difficulty concentrating, remembering, or making decisions?: No Patient able to express need for assistance with ADLs?: Yes Does the patient have difficulty dressing or bathing?: Yes Independently performs ADLs?: No Communication: Independent Dressing (OT): Needs assistance Is this a change from baseline?: Change from baseline, expected to last >3 days Grooming: Independent Feeding: Independent Bathing: Needs assistance Is this a change from baseline?: Change from baseline, expected to last >3 days Toileting: Needs assistance Is this a change from baseline?: Change from baseline, expected to last >3days In/Out Bed: Needs assistance Is this a change from  baseline?: Change from baseline, expected to last >3 days Walks in Home: Needs assistance Is this a change from baseline?: Change from baseline, expected to last >3 days Does the patient have difficulty walking or climbing stairs?: Yes Weakness of Legs: Left Weakness of Arms/Hands: None  Permission Sought/Granted                  Emotional Assessment       Orientation: : Oriented to Self, Oriented to Place, Oriented to  Time, Oriented to Situation      Admission diagnosis:  Pelvic fracture ([place]) [S32.9XXA] Closed nondisplaced fracture of posterior wall of left acetabulum, initial encounter [facility]) [S32.425A] Patient Active Problem List   Diagnosis Date Noted   Pelvic fracture ([place]) 07/21/2022   Bilateral pubic rami fractures ([place]) 07/21/2022   Acetabular fracture ([place]) 07/21/2022   Normocytic anemia 07/21/2022   History of stroke with residual deficit 07/21/2022   Chronic wound 07/21/2022   Permanent atrial fibrillation ([place])    Acute on chronic combined systolic and diastolic CHF (congestive heart failure) ([place]) 06/11/2022   Acute respiratory failure with hypoxia ([place]) 06/11/2022   Fall 06/11/2022   Controlled type 2 diabetes mellitus without complication, without long-term current use of insulin ([place]) 06/11/2022   Acquired thrombophilia ([place]) 10/11/2021   Benign prostatic hyperplasia 10/11/2021   Diabetic peripheral neuropathy ([place]) 10/11/2021   Gout 10/11/2021   Hemiplegia as  late effect of cerebrovascular disease ([place]) 10/11/2021   Hyperglycemia due to type 2 diabetes mellitus ([place]) 10/11/2021   Hypertensive retinopathy 10/11/2021   Leg ulcer ([place]) 10/11/2021   Neutropenia ([place]) 10/11/2021   Hyponatremia 81/15/7262   Chronic systolic CHF (congestive heart failure) ([place]) 09/19/2021   NSTEMI (non-ST elevated myocardial infarction) ([place]) 09/19/2021   Type 2 diabetes mellitus with foot ulcer ([place]) 09/19/2021   Chronic leg wounds 09/19/2021   Elevated troponin    CHF  (congestive heart failure) ([place]) 09/17/2021   Chest pain 11/25/2015   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/25/2015   Hemiparesis and alteration of sensations as late effects of stroke ([place]) 09/25/2015   Abnormal stress test 07/21/2015   Foot drop, left 03/11/2015   Gait disorder 03/11/2015   Precordial pain 03/19/2014   Dizziness 03/17/2014   Near syncope 03/17/2014   Encounter for therapeutic drug monitoring 09/24/2013   Rectal bleeding 10/19/2012   External hemorrhoid, bleeding 10/19/2012   Long term current use of anticoagulant 10/12/2010   GLAUCOMA 09/17/2010   GERD 09/17/2010   HEMORRHAGE OF RECTUM AND ANUS 09/17/2010   BRADYCARDIA 06/09/2010   VENTRICULAR TACHYCARDIA 12/30/2008   SYNCOPE 12/30/2008   PULMONARY INFILTRATE INCLUDES (EOSINOPHILIA) 12/28/2007   HLD (hyperlipidemia) 12/27/2007   Essential hypertension 12/27/2007   Coronary atherosclerosis 03/55/9741   Alcoholic cardiomyopathy ([place]) 12/27/2007   Persistent atrial fibrillation ([place]) 12/27/2007   Cerebral artery occlusion with cerebral infarction ([place]) 12/27/2007   COUGH 12/27/2007   PCP:  [name], MD Pharmacy:   [facility] [address] [place] [address] Phone: [phone] Fax: [phone]  [facility] [address] [place] [address] Phone: [phone] Fax: [place], [place] [address] Phone: [phone] Fax: [phone]     Social Determinants of Health (SDOH) Interventions    Readmission Risk Interventions     No data to display

## 2022-07-23 NOTE — Progress Notes (Signed)
Pt with a 7 beat wide QRS Vtach episode. No complaints of chest pain/ discomfort. Currently running afib. Vitals: BP 126/64(82), HR: 63, RR: 18, 02:98. Provider notified and labs ordered.   0107: Pt with no glucose checks. Provider notified and placed orders. Pt stated that he does not take insulin at home and preferred not to start. Stated that he would talk with attending about decision.  Patient has a home glucose monitor on left arm. Not using

## 2022-07-23 NOTE — NC FL2 (Signed)
[place] LEVEL OF CARE SCREENING TOOL     IDENTIFICATION  Patient Name: Vincent [name] Birthdate: November 21, 1935 Sex: male Admission Date (Current Location): 07/20/2022  [facility] and [place] Number:  Herbalist and Address:  [facility] [place] [place], [place] [address], [place], [place] [address]      Provider Number: [id number]  Attending Physician Name and Address:  Debbe Odea, MD  Relative Name and Phone Number:  Amadi, Frady Sister [phone]    Current Level of Care: Hospital Recommended Level of Care: [place] Prior Approval Number:    Date Approved/Denied:   PASRR Number: [id number]  Discharge Plan: SNF    Current Diagnoses: Patient Active Problem List   Diagnosis Date Noted   Pelvic fracture ([place]) 07/21/2022   Bilateral pubic rami fractures ([place]) 07/21/2022   Acetabular fracture ([place]) 07/21/2022   Normocytic anemia 07/21/2022   History of stroke with residual deficit 07/21/2022   Chronic wound 07/21/2022   Permanent atrial fibrillation ([place])    Acute on chronic combined systolic and diastolic CHF (congestive heart failure) ([place]) 06/11/2022   Acute respiratory failure with hypoxia ([place]) 06/11/2022   Fall 06/11/2022   Controlled type 2 diabetes mellitus without complication, without long-term current use of insulin ([place]) 06/11/2022   Acquired thrombophilia ([place]) 10/11/2021   Benign prostatic hyperplasia 10/11/2021   Diabetic peripheral neuropathy ([place]) 10/11/2021   Gout 10/11/2021   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 ([place]) 10/11/2021   Hyperglycemia due to type 2 diabetes mellitus ([place]) 10/11/2021   Hypertensive retinopathy 10/11/2021   Leg ulcer (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/25/2015   Hemiparesis and alteration of sensations as late effects of stroke ([place]) 09/25/2015   Abnormal stress test 07/21/2015   Foot drop, left 03/11/2015   Gait disorder 03/11/2015   Precordial pain 03/19/2014   Dizziness 03/17/2014   Near syncope 03/17/2014   Encounter for therapeutic drug monitoring 09/24/2013   Rectal bleeding 10/19/2012   External hemorrhoid, bleeding 10/19/2012   Long term current use of anticoagulant 10/12/2010   GLAUCOMA 09/17/2010   GERD 09/17/2010   HEMORRHAGE OF RECTUM AND ANUS 09/17/2010   BRADYCARDIA 06/09/2010   VENTRICULAR TACHYCARDIA 12/30/2008   SYNCOPE 12/30/2008   PULMONARY INFILTRATE INCLUDES (EOSINOPHILIA) 12/28/2007   HLD (hyperlipidemia) 12/27/2007   Essential hypertension 12/27/2007   Coronary atherosclerosis 25/42/7062   Alcoholic cardiomyopathy ([place]) 12/27/2007   Persistent atrial fibrillation ([place]) 12/27/2007   Cerebral artery occlusion with cerebral infarction ([place]) 12/27/2007   COUGH 12/27/2007    Orientation RESPIRATION BLADDER Height & Weight     Self, Time, Situation, Place  Normal External catheter, Continent Weight: 185 lb 10 oz (84.2 kg) Height:  '5\' 9"'$  (175.3 cm)  BEHAVIORAL SYMPTOMS/MOOD NEUROLOGICAL BOWEL NUTRITION STATUS      Continent Diet (see discharge summary)  AMBULATORY STATUS COMMUNICATION OF NEEDS Skin   Limited Assist Verbally Other (Comment) (venous stasis ulcer, right and left leg)                       Personal Care Assistance Level  of Assistance  Bathing, Dressing Bathing Assistance: Limited assistance   Dressing Assistance: Limited assistance     Functional Limitations Info  Sight Sight Info: Impaired        SPECIAL CARE FACTORS FREQUENCY  PT (By licensed PT), OT (By licensed OT)     PT  Frequency: 4-5x/wk OT Frequency: 4-5x/wk            Contractures Contractures Info: Not present    Additional Factors Info  Code Status Code Status Info: FULL             Current Medications (07/23/2022):  This is the current hospital active medication list Current Facility-Administered Medications  Medication Dose Route Frequency Provider Last Rate Last Admin   acetaminophen (TYLENOL) tablet 650 mg  650 mg Oral Q6H PRN Norval Morton, MD       Or   acetaminophen (TYLENOL) suppository 650 mg  650 mg Rectal Q6H PRN Fuller Plan A, MD       albuterol (PROVENTIL) (2.5 MG/3ML) 0.083% nebulizer solution 2.5 mg  2.5 mg Nebulization Q6H PRN Fuller Plan A, MD       apixaban (ELIQUIS) tablet 5 mg  5 mg Oral BID [name], Rondell A, MD   5 mg at 07/23/22 0845   atorvastatin (LIPITOR) tablet 80 mg  80 mg Oral Daily Smith, Rondell A, MD   80 mg at 07/23/22 0846   brimonidine (ALPHAGAN) 0.15 % ophthalmic solution 1 drop  1 drop Both Eyes BID Fuller Plan A, MD   1 drop at 07/23/22 0846   carvedilol (COREG) tablet 6.25 mg  6.25 mg Oral BID WC Smith, Rondell A, MD   6.25 mg at 07/23/22 0845   digoxin (LANOXIN) tablet 0.125 mg  0.125 mg Oral Daily [name], Rondell A, MD   0.125 mg at 07/23/22 0846   empagliflozin (JARDIANCE) tablet 25 mg  25 mg Oral Daily Smith, Rondell A, MD   25 mg at 07/23/22 0848   famotidine (PEPCID) tablet 20 mg  20 mg Oral Daily [name], Rondell A, MD   20 mg at 07/23/22 0846   furosemide (LASIX) tablet 40 mg  40 mg Oral Daily [name], Rondell A, MD   40 mg at 07/23/22 0846   HYDROcodone-acetaminophen (NORCO/VICODIN) 5-325 MG per tablet 1 tablet  1 tablet Oral Q6H PRN Fuller Plan A, MD   1 tablet at 07/23/22 0306   isosorbide mononitrate (IMDUR) 24 hr tablet 30 mg  30 mg Oral Daily Smith, Rondell A, MD   30 mg at 07/23/22 0846   latanoprost (XALATAN) 0.005 % ophthalmic solution 1 drop  1 drop Both Eyes QHS Smith, Rondell A, MD   1 drop at 07/22/22 2120   loratadine  (CLARITIN) tablet 10 mg  10 mg Oral Daily Fuller Plan A, MD   10 mg at 07/23/22 0846   morphine (PF) 4 MG/ML injection 4 mg  4 mg Intravenous Q3H PRN Fuller Plan A, MD       ondansetron (ZOFRAN) tablet 4 mg  4 mg Oral Q6H PRN Fuller Plan A, MD       Or   ondansetron (ZOFRAN) injection 4 mg  4 mg Intravenous Q6H PRN Smith, Rondell A, MD       polyethylene glycol (MIRALAX / GLYCOLAX) packet 17 g  17 g Oral Daily PRN Debbe Odea, MD       sacubitril-valsartan (ENTRESTO) 97-103 mg per tablet  1 tablet Oral BID Norval Morton, MD   1 tablet at  07/23/22 0848   sodium chloride flush (NS) 0.9 % injection 3 mL  3 mL Intravenous Q12H Smith, Rondell A, MD   3 mL at 07/23/22 0848   spironolactone (ALDACTONE) tablet 25 mg  25 mg Oral Daily Fuller Plan A, MD   25 mg at 07/23/22 4665     Discharge Medications: Please see discharge summary for a list of discharge medications.  Relevant Imaging Results:  Relevant Lab Results:   Additional Information    [place], LCSWA

## 2022-07-23 NOTE — Progress Notes (Signed)
Physical Therapy Treatment Patient Details Name: Vincent [name] MRN: [id number] DOB: Nov 25, 1935 Today's Date: 07/23/2022   History of Present Illness 86 yo male s/p fall with L hip. CT () displaced superior pubic ramus extending into the acetabulum and anterior column on the L. Non displaced fx of posterior wall of acetabulum, mildly displaced fx of the inferior pubic ramus on L. Strandy hemorrhage along the lateral pelvic wall on L. Per Dr. Sheela Stack non operative. PMH HF with reduced EF, permanent ARIB on anticoagulation, CAD s/p PCI to RCA, DM, CVA with residual L weakness, L drop foot , chronic wounds,gluacoma    PT Comments    Patient slowly progressing with mobility.  Agree with SNF for discharge as patient will need longer recovery time than CIR can provide.  Will benefit from continued PT.   Recommendations for follow up therapy are one component of a multi-disciplinary discharge planning process, led by the attending physician.  Recommendations may be updated based on patient status, additional functional criteria and insurance authorization.  Follow Up Recommendations  Skilled nursing-short term rehab (<3 hours/day) Can patient physically be transported by private vehicle: No   Assistance Recommended at Discharge Frequent or constant Supervision/Assistance  Patient can return home with the following Two people to help with walking and/or transfers;A lot of help with bathing/dressing/bathroom;Assistance with cooking/housework;Assist for transportation;Help with stairs or ramp for entrance   Equipment Recommendations  Rolling Vincent (2 wheels);BSC/3in1    Recommendations for Other Services       Precautions / Restrictions Precautions Precautions: Fall Precaution Comments: incontinence of bladder- recommend brief Restrictions LLE Weight Bearing: Weight bearing as tolerated     Mobility  Bed Mobility Overal bed mobility: Needs Assistance Bed Mobility: Supine to Sit      Supine to sit: Mod assist     General bed mobility comments: Segmental advancement of LE's towards EOB and encouraged pt to keep LE's close together for pain control.    Transfers Overall transfer level: Needs assistance Equipment used: Rolling Vincent (2 wheels) Transfers: Sit to/from Stand, Bed to chair/wheelchair/BSC Sit to Stand: Mod assist   Step pivot transfers: Mod assist       General transfer comment: required cueing to reach full upright, cueing for sequencing, required increased time for mobility    Ambulation/Gait                   Stairs             Wheelchair Mobility    Modified [name] (Stroke Patients Only)       Balance Overall balance assessment: Needs assistance Sitting-balance support: No upper extremity supported, Feet supported Sitting balance-Leahy Scale: Good     Standing balance support: Bilateral upper extremity supported, During functional activity, Reliant on assistive device for balance Standing balance-Leahy Scale: Poor                              Cognition Arousal/Alertness: Awake/alert Behavior During Therapy: WFL for tasks assessed/performed                           Following Commands: Follows multi-step commands with increased time                Exercises General Exercises - Lower Extremity Ankle Circles/Pumps: AROM, Both, 10 reps, Seated Quad Sets: AROM, Both, 10 reps, Seated    General Comments  Pertinent Vitals/Pain Pain Assessment Pain Assessment: 0-10 Pain Score: 5  Pain Location: R LE Pain Descriptors / Indicators: Discomfort, Aching, Guarding Pain Intervention(s): Limited activity within patient's tolerance, Monitored during session    Home Living                          Prior Function            PT Goals (current goals can now be found in the care plan section) Progress towards PT goals: Progressing toward goals    Frequency    Min  3X/week      PT Plan Current plan remains appropriate    Co-evaluation              AM-PAC PT "6 Clicks" Mobility   Outcome Measure  Help needed turning from your back to your side while in a flat bed without using bedrails?: A Lot Help needed moving from lying on your back to sitting on the side of a flat bed without using bedrails?: A Lot Help needed moving to and from a bed to a chair (including a wheelchair)?: A Lot Help needed standing up from a chair using your arms (e.g., wheelchair or bedside chair)?: A Lot Help needed to walk in hospital room?: Total Help needed climbing 3-5 steps with a railing? : Total 6 Click Score: 10    End of Session Equipment Utilized During Treatment: Gait belt Activity Tolerance: Patient tolerated treatment well Patient left: with call bell/phone within reach;with chair alarm set;in chair;with family/visitor present Nurse Communication: Mobility status PT Visit Diagnosis: Unsteadiness on feet (R26.81);Pain;Difficulty in walking, not elsewhere classified (R26.2) Pain - Right/Left: Left     Time: 1400-1435 PT Time Calculation (min) (ACUTE ONLY): 35 min  Charges:  $Gait Training: 23-37 mins                     07/23/2022 Margie, PT Acute Rehabilitation Services Office:  [phone]'    Shanna Cisco 07/23/2022, 2:47 PM

## 2022-07-24 DIAGNOSIS — I959 Hypotension, unspecified: Secondary | ICD-10-CM

## 2022-07-24 DIAGNOSIS — S32592A Other specified fracture of left pubis, initial encounter for closed fracture: Secondary | ICD-10-CM | POA: Diagnosis not present

## 2022-07-24 DIAGNOSIS — S32591A Other specified fracture of right pubis, initial encounter for closed fracture: Secondary | ICD-10-CM | POA: Diagnosis not present

## 2022-07-24 LAB — BASIC METABOLIC PANEL
Anion gap: 10 (ref 5–15)
BUN: 12 mg/dL (ref 8–23)
CO2: 28 mmol/L (ref 22–32)
Calcium: 8.2 mg/dL — ABNORMAL LOW (ref 8.9–10.3)
Chloride: 98 mmol/L (ref 98–111)
Creatinine, Ser: 0.85 mg/dL (ref 0.61–1.24)
GFR, Estimated: >60 mL/min (ref >60)
Glucose, Bld: 141 mg/dL — ABNORMAL HIGH (ref 70–99)
Potassium: 3.3 mmol/L — ABNORMAL LOW (ref 3.5–5.1)
Sodium: 136 mmol/L (ref 135–145)

## 2022-07-24 LAB — CBC
HCT: 31.2 % — ABNORMAL LOW (ref 39.0–52.0)
Hemoglobin: 10 g/dL — ABNORMAL LOW (ref 13.0–17.0)
MCH: 27 pg (ref 26.0–34.0)
MCHC: 32.1 g/dL (ref 30.0–36.0)
MCV: 84.3 fL (ref 80.0–100.0)
Platelets: 167 10*3/uL (ref 150–400)
RBC: 3.7 MIL/uL — ABNORMAL LOW (ref 4.22–5.81)
RDW: 18.3 % — ABNORMAL HIGH (ref 11.5–15.5)
WBC: 4.6 10*3/uL (ref 4.0–10.5)
nRBC: 0 % (ref 0.0–0.2)

## 2022-07-24 LAB — MAGNESIUM: Magnesium: 1.8 mg/dL (ref 1.7–2.4)

## 2022-07-24 LAB — GLUCOSE, CAPILLARY
Glucose-Capillary: 108 mg/dL — ABNORMAL HIGH (ref 70–99)
Glucose-Capillary: 118 mg/dL — ABNORMAL HIGH (ref 70–99)
Glucose-Capillary: 137 mg/dL — ABNORMAL HIGH (ref 70–99)
Glucose-Capillary: 100 mg/dL — ABNORMAL HIGH (ref 70–99)

## 2022-07-24 MED ORDER — ACETAMINOPHEN 500 MG PO TABS
1000.0000 mg | ORAL_TABLET | Freq: Four times a day (QID) | ORAL | Status: DC
  Administered 2022-07-24 – 2022-07-27 (×10): 1000 mg via ORAL
  Filled 2022-07-24 (×15): qty 2

## 2022-07-24 MED ORDER — IBUPROFEN 200 MG PO TABS
400.0000 mg | ORAL_TABLET | Freq: Four times a day (QID) | ORAL | Status: DC
  Administered 2022-07-24 – 2022-07-25 (×5): 400 mg via ORAL
  Filled 2022-07-24 (×5): qty 2

## 2022-07-24 MED ORDER — POTASSIUM CHLORIDE CRYS ER 10 MEQ PO TBCR
40.0000 meq | EXTENDED_RELEASE_TABLET | ORAL | Status: AC
  Administered 2022-07-24 (×2): 40 meq via ORAL
  Filled 2022-07-24 (×2): qty 4

## 2022-07-24 NOTE — Progress Notes (Signed)
[facility] Progress Note  Patient: Vincent [name]     YVO:[id number]  DOA: 07/20/2022   PCP: [name], MD       Brief hospital course: This is an 86 year old male with congestive heart failure, atrial fibrillation, coronary artery disease status post PCI, diabetes mellitus, CVA who presents to the hospital after a fall.  He states that he was trying to climb the stairs when he lost his balance and fell. In the ED, he was noted to have superior and inferior pubic rami fractures and fracture of the posterior wall of the acetabulum.  Per orthopedic surgery, there is no need for surgery at this time.  We are managing him with pain medications and awaiting a PT OT evaluation.  Subjective:  He feels light headed today. No other complaints.   Assessment and Plan: Principal Problem:   Bilateral pubic rami fractures ([place])   Acetabular fracture ([place]) - PT recommends SNF - cont pain control  Active Problems:  Feeling light headed- hypotensive - SBP ~ 100, not hypoxic, no drop in Hgb - he may be orthostatic- unable to stand for orthostatics due to fracture and pain with weight bearing - hold antihypertensives and diuretics for today - have asked him to try to reduce the amount of narcotics he is using for pain- will add APAP 1000 mg QID and Ibuprofen QID for pain    Permanent atrial fibrillation- Long term current use of anticoagulant Chronic systolic heart failure - ECHO 8/23> EF 25-30% with global hypokinesis of LV -Continue Eliquis and Digoxin - due to hypotension, hold carvedilol, Entresto, isosorbide mononitrate, spironolactone and Lasix    Normocytic anemia - Hgb ~ 10  Hypokalemia - replaced- check Mg+    Controlled type 2 diabetes mellitus without complication, without long-term current use of insulin ([place]) -Continue Jardiance - he does not want insulin during the hospital stay- CBGs are resonably controlled - last A1c was 7.0          Code Status: Full  Code DVT prophylaxis:   apixaban (ELIQUIS) tablet 5 mg   Consultants: orthopedic surgery Level of Care: Level of care: Telemetry Medical Total time on patient care: 35   Objective:   Vitals:   07/23/22 1439 07/23/22 1726 07/23/22 2050 07/24/22 0805  BP:   (!) 101/54   Pulse:  68    Resp:   16   Temp:   97.7 F (36.5 C) 97.9 F (36.6 C)  TempSrc:   Oral   SpO2: 92%  92% 100%  Weight:      Height:       Filed Weights   07/21/22 1345 07/22/22 0427 07/23/22 0745  Weight: 85.8 kg 85.2 kg 84.2 kg   Exam: General exam: Appears comfortable  HEENT: oral mucosa moist Respiratory system: Clear to auscultation.  Cardiovascular system: S1 & S2 heard  Gastrointestinal system: Abdomen soft, non-tender, nondistended. Normal bowel sounds   Extremities: No cyanosis, clubbing or edema Psychiatry:  Mood & affect appropriate.    CBC: Recent Labs  Lab 07/21/22 0053 07/21/22 0830 07/22/22 0019 07/24/22 0908  WBC 7.7  --  4.8 4.6  HGB 11.8* 10.6* 10.0* 10.0*  HCT 37.9* 34.4* 32.1* 31.2*  MCV 87.3  --  85.1 84.3  PLT 159  --  152 863    Basic Metabolic Panel: Recent Labs  Lab 07/21/22 0053 07/22/22 0019 07/23/22 0259 07/24/22 0908  NA 141 138 139 136  K 3.7 3.6 3.5 3.3*  CL 102 103 100  98  CO2 '22 27 30 28  '$ GLUCOSE 159* 119* 129* 141*  BUN '14 10 12 12  '$ CREATININE 1.02 0.98 0.99 0.85  CALCIUM 9.2 8.4* 8.8* 8.2*  MG  --  1.8 1.9  --     GFR: Estimated Creatinine Clearance: 64.7 mL/min (by C-G formula based on SCr of 0.85 mg/dL).  Scheduled Meds:  acetaminophen  1,000 mg Oral QID   apixaban  5 mg Oral BID   atorvastatin  80 mg Oral Daily   brimonidine  1 drop Both Eyes BID   carvedilol  6.25 mg Oral BID WC   digoxin  0.125 mg Oral Daily   empagliflozin  25 mg Oral Daily   famotidine  20 mg Oral Daily   furosemide  40 mg Oral Daily   isosorbide mononitrate  30 mg Oral Daily   latanoprost  1 drop Both Eyes QHS   loratadine  10 mg Oral Daily   sacubitril-valsartan   1 tablet Oral BID   sodium chloride flush  3 mL Intravenous Q12H   spironolactone  25 mg Oral Daily   Continuous Infusions: Imaging and lab data was personally reviewed No results found.  LOS: 0 days   Author: Debbe Odea  07/24/2022 9:55 AM  To contact [facility]>   Check the care team in [facility] and look for the attending/consulting [place] provider listed  Log into [email or web address] and use [place]'s universal password   Go to> "[facility]"  and find provider  If you still have difficulty reaching the provider, please page the [facility] (Director on Call) for the Hospitalists listed on amion

## 2022-07-24 NOTE — TOC Initial Note (Signed)
Transition of Care [facility]) - Initial/Assessment Note    Patient Details  Name: Vincent [name] MRN: [id number] Date of Birth: April 03, 1936  [facility] CM/SW Contact:    Joanne Chars, LCSW Phone Number: 07/24/2022, 11:12 AM  Clinical Narrative:     CSW met with pt regarding DC recommendation for SNF.  Pt is agreeable to this, permission given to send out referral in hub.  Permission given to speak with sister [name].  Pt lives alone, reports [facility] currently providing services.  Pt reports he is vaccinated for covid with at least one booster.                Expected Discharge Plan: Skilled Nursing Facility Barriers to Discharge: Continued Medical Work up, SNF Pending bed offer   Patient Goals and CMS Choice Patient states their goals for this hospitalization and ongoing recovery are:: get well      Expected Discharge Plan and Services Expected Discharge Plan: [place] In-house Referral: Clinical Social Work   Post Acute Care Choice: [place] Living arrangements for the past 2 months: [place]                                      Prior Living Arrangements/Services Living arrangements for the past 2 months: Single Family Home Lives with:: Self Patient language and need for interpreter reviewed:: Yes Do you feel safe going back to the place where you live?: Yes      Need for Family Participation in Patient Care: No (Comment) Care giver support system in place?: Yes (comment) Current home services: Home OT, Home PT ([facility]?) Criminal Activity/Legal Involvement Pertinent to Current Situation/Hospitalization: No - Comment as needed  Activities of Daily Living Home Assistive Devices/Equipment: CBG Meter ADL Screening (condition at time of admission) Patient's cognitive ability adequate to safely complete daily activities?: Yes Is the patient deaf or have difficulty hearing?: No Does the patient have  difficulty seeing, even when wearing glasses/contacts?: No Does the patient have difficulty concentrating, remembering, or making decisions?: No Patient able to express need for assistance with ADLs?: Yes Does the patient have difficulty dressing or bathing?: Yes Independently performs ADLs?: No Communication: Independent Dressing (OT): Needs assistance Is this a change from baseline?: Change from baseline, expected to last >3 days Grooming: Independent Feeding: Independent Bathing: Needs assistance Is this a change from baseline?: Change from baseline, expected to last >3 days Toileting: Needs assistance Is this a change from baseline?: Change from baseline, expected to last >3days In/Out Bed: Needs assistance Is this a change from baseline?: Change from baseline, expected to last >3 days Walks in Home: Needs assistance Is this a change from baseline?: Change from baseline, expected to last >3 days Does the patient have difficulty walking or climbing stairs?: Yes Weakness of Legs: Left Weakness of Arms/Hands: None  Permission Sought/Granted Permission sought to share information with : Family Supports Permission granted to share information with : Yes, Verbal Permission Granted  Share Information with NAME: sister [name]           Emotional Assessment Appearance:: Appears stated age Attitude/Demeanor/Rapport: Engaged Affect (typically observed): Appropriate, Pleasant Orientation: : Oriented to Self, Oriented to Place, Oriented to  Time, Oriented to Situation      Admission diagnosis:  Pelvic fracture ([place]) [S32.9XXA] Closed nondisplaced fracture of posterior wall of left acetabulum, initial encounter [facility]) [S32.425A] Patient  Active Problem List   Diagnosis Date Noted   Hypotension 07/24/2022   Pelvic fracture (HCC) 07/21/2022   Bilateral pubic rami fractures (HCC) 07/21/2022   Acetabular fracture ([place]) 07/21/2022   Normocytic anemia 07/21/2022   History of stroke with  residual deficit 07/21/2022   Chronic wound 07/21/2022   Permanent atrial fibrillation (HCC)    Acute on chronic combined systolic and diastolic CHF (congestive heart failure) ([place]) 06/11/2022   Acute respiratory failure with hypoxia ([place]) 06/11/2022   Fall 06/11/2022   Controlled type 2 diabetes mellitus without complication, without long-term current use of insulin ([place]) 06/11/2022   Acquired thrombophilia ([place]) 10/11/2021   Benign prostatic hyperplasia 10/11/2021   Diabetic peripheral neuropathy ([place]) 10/11/2021   Gout 10/11/2021   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 ([place]) 10/11/2021   Hyperglycemia due to type 2 diabetes mellitus ([place]) 10/11/2021   Hypertensive retinopathy 10/11/2021   Leg ulcer ([place]) 10/11/2021   Neutropenia ([place]) 10/11/2021   Hyponatremia 83/38/2505   Chronic systolic CHF (congestive heart failure) ([place]) 09/19/2021   NSTEMI (non-ST elevated myocardial infarction) ([place]) 09/19/2021   Type 2 diabetes mellitus with foot ulcer ([place]) 09/19/2021   Chronic leg wounds 09/19/2021   Elevated troponin    CHF (congestive heart failure) ([place]) 09/17/2021   Chest pain 11/25/2015   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/25/2015   Hemiparesis and alteration of sensations as late effects of stroke ([place]) 09/25/2015   Abnormal stress test 07/21/2015   Foot drop, left 03/11/2015   Gait disorder 03/11/2015   Precordial pain 03/19/2014   Dizziness 03/17/2014   Near syncope 03/17/2014   Encounter for therapeutic drug monitoring 09/24/2013   Rectal bleeding 10/19/2012   External hemorrhoid, bleeding 10/19/2012   Long term current use of anticoagulant 10/12/2010   GLAUCOMA 09/17/2010   GERD 09/17/2010   HEMORRHAGE OF RECTUM AND ANUS 09/17/2010   BRADYCARDIA 06/09/2010   VENTRICULAR TACHYCARDIA 12/30/2008   SYNCOPE 12/30/2008   PULMONARY INFILTRATE INCLUDES (EOSINOPHILIA) 12/28/2007   HLD (hyperlipidemia) 12/27/2007   Essential hypertension 12/27/2007    Coronary atherosclerosis 39/76/7341   Alcoholic cardiomyopathy ([place]) 12/27/2007   Persistent atrial fibrillation ([place]) 12/27/2007   Cerebral artery occlusion with cerebral infarction ([place]) 12/27/2007   COUGH 12/27/2007   PCP:  [name], MD Pharmacy:   [facility] [address], [place] [address] Phone: [phone] Fax: [phone]  [facility] [address] [address] Phone: [phone] Fax: [place], [place] [address] Phone: [phone] Fax: [phone]     Social Determinants of Health (SDOH) Interventions    Readmission Risk Interventions     No data to display

## 2022-07-25 DIAGNOSIS — Z8249 Family history of ischemic heart disease and other diseases of the circulatory system: Secondary | ICD-10-CM | POA: Diagnosis not present

## 2022-07-25 DIAGNOSIS — W19XXXA Unspecified fall, initial encounter: Secondary | ICD-10-CM | POA: Diagnosis present

## 2022-07-25 DIAGNOSIS — S32591A Other specified fracture of right pubis, initial encounter for closed fracture: Secondary | ICD-10-CM | POA: Diagnosis present

## 2022-07-25 DIAGNOSIS — K219 Gastro-esophageal reflux disease without esophagitis: Secondary | ICD-10-CM | POA: Diagnosis present

## 2022-07-25 DIAGNOSIS — I5022 Chronic systolic (congestive) heart failure: Secondary | ICD-10-CM | POA: Diagnosis present

## 2022-07-25 DIAGNOSIS — H409 Unspecified glaucoma: Secondary | ICD-10-CM | POA: Diagnosis present

## 2022-07-25 DIAGNOSIS — I959 Hypotension, unspecified: Secondary | ICD-10-CM | POA: Diagnosis not present

## 2022-07-25 DIAGNOSIS — E1169 Type 2 diabetes mellitus with other specified complication: Secondary | ICD-10-CM | POA: Diagnosis present

## 2022-07-25 DIAGNOSIS — I2510 Atherosclerotic heart disease of native coronary artery without angina pectoris: Secondary | ICD-10-CM | POA: Diagnosis present

## 2022-07-25 DIAGNOSIS — S32592A Other specified fracture of left pubis, initial encounter for closed fracture: Secondary | ICD-10-CM | POA: Diagnosis present

## 2022-07-25 DIAGNOSIS — E1141 Type 2 diabetes mellitus with diabetic mononeuropathy: Secondary | ICD-10-CM | POA: Diagnosis present

## 2022-07-25 DIAGNOSIS — D649 Anemia, unspecified: Secondary | ICD-10-CM | POA: Diagnosis not present

## 2022-07-25 DIAGNOSIS — S32425A Nondisplaced fracture of posterior wall of left acetabulum, initial encounter for closed fracture: Secondary | ICD-10-CM | POA: Diagnosis present

## 2022-07-25 DIAGNOSIS — E876 Hypokalemia: Secondary | ICD-10-CM | POA: Diagnosis not present

## 2022-07-25 DIAGNOSIS — Z7984 Long term (current) use of oral hypoglycemic drugs: Secondary | ICD-10-CM | POA: Diagnosis not present

## 2022-07-25 DIAGNOSIS — J449 Chronic obstructive pulmonary disease, unspecified: Secondary | ICD-10-CM | POA: Diagnosis present

## 2022-07-25 DIAGNOSIS — Z955 Presence of coronary angioplasty implant and graft: Secondary | ICD-10-CM | POA: Diagnosis not present

## 2022-07-25 DIAGNOSIS — M109 Gout, unspecified: Secondary | ICD-10-CM | POA: Diagnosis present

## 2022-07-25 DIAGNOSIS — Z7901 Long term (current) use of anticoagulants: Secondary | ICD-10-CM | POA: Diagnosis not present

## 2022-07-25 DIAGNOSIS — E785 Hyperlipidemia, unspecified: Secondary | ICD-10-CM | POA: Diagnosis present

## 2022-07-25 DIAGNOSIS — Z79899 Other long term (current) drug therapy: Secondary | ICD-10-CM | POA: Diagnosis not present

## 2022-07-25 DIAGNOSIS — S329XXA Fracture of unspecified parts of lumbosacral spine and pelvis, initial encounter for closed fracture: Secondary | ICD-10-CM | POA: Diagnosis present

## 2022-07-25 DIAGNOSIS — I110 Hypertensive heart disease with heart failure: Secondary | ICD-10-CM | POA: Diagnosis present

## 2022-07-25 DIAGNOSIS — I4821 Permanent atrial fibrillation: Secondary | ICD-10-CM | POA: Diagnosis present

## 2022-07-25 DIAGNOSIS — I6930 Unspecified sequelae of cerebral infarction: Secondary | ICD-10-CM | POA: Diagnosis not present

## 2022-07-25 DIAGNOSIS — Z87891 Personal history of nicotine dependence: Secondary | ICD-10-CM | POA: Diagnosis not present

## 2022-07-25 LAB — GLUCOSE, CAPILLARY
Glucose-Capillary: 109 mg/dL — ABNORMAL HIGH (ref 70–99)
Glucose-Capillary: 99 mg/dL (ref 70–99)
Glucose-Capillary: 125 mg/dL — ABNORMAL HIGH (ref 70–99)
Glucose-Capillary: 136 mg/dL — ABNORMAL HIGH (ref 70–99)

## 2022-07-25 LAB — BASIC METABOLIC PANEL
Anion gap: 7 (ref 5–15)
BUN: 16 mg/dL (ref 8–23)
CO2: 28 mmol/L (ref 22–32)
Calcium: 8.2 mg/dL — ABNORMAL LOW (ref 8.9–10.3)
Chloride: 104 mmol/L (ref 98–111)
Creatinine, Ser: 1.05 mg/dL (ref 0.61–1.24)
GFR, Estimated: >60 mL/min (ref >60)
Glucose, Bld: 97 mg/dL (ref 70–99)
Potassium: 3.9 mmol/L (ref 3.5–5.1)
Sodium: 139 mmol/L (ref 135–145)

## 2022-07-25 MED ORDER — SODIUM CHLORIDE 0.9 % IV BOLUS
500.0000 mL | Freq: Once | INTRAVENOUS | Status: AC
  Administered 2022-07-25: 500 mL via INTRAVENOUS

## 2022-07-25 MED ORDER — SODIUM CHLORIDE 0.9% IV SOLUTION: Freq: Once | INTRAVENOUS | Status: AC

## 2022-07-25 MED ORDER — POTASSIUM CHLORIDE CRYS ER 20 MEQ PO TBCR
40.0000 meq | EXTENDED_RELEASE_TABLET | Freq: Once | ORAL | Status: AC
  Administered 2022-07-25: 40 meq via ORAL
  Filled 2022-07-25: qty 2

## 2022-07-25 NOTE — Progress Notes (Signed)
[facility] Progress Note  Patient: Vincent [name]     WNI:[id number]  DOA: 07/20/2022   PCP: [name], MD       Brief hospital course: This is an 86 year old male with congestive heart failure, atrial fibrillation, coronary artery disease status post PCI, diabetes mellitus, CVA who presents to the hospital after a fall.  He states that he was trying to climb the stairs when he lost his balance and fell. In the ED, he was noted to have superior and inferior pubic rami fractures and fracture of the posterior wall of the acetabulum.  Per orthopedic surgery, there is no need for surgery at this time.  We are managing him with pain medications and awaiting a PT OT evaluation.  Subjective:  Still lightheaded today.  Assessment and Plan: Principal Problem:   Bilateral pubic rami fractures ([place])   Acetabular fracture ([place]) - PT recommends SNF - cont pain control  Active Problems:  Feeling light headed- hypotensive - SBP ~ 100, not hypoxic, no drop in Hgb - he may be orthostatic- unable to stand for orthostatics due to fracture and pain with weight bearing - hold antihypertensives and diuretics - will give a NS bolus today   Permanent atrial fibrillation- Long term current use of anticoagulant Chronic systolic heart failure - ECHO 8/23> EF 25-30% with global hypokinesis of LV -Continue Eliquis and Digoxin - due to hypotension, hold carvedilol, Entresto, isosorbide mononitrate, spironolactone and Lasix    Normocytic anemia - Hgb ~ 10  Hypokalemia - replaced- check Mg+    Controlled type 2 diabetes mellitus without complication, without long-term current use of insulin ([place]) -Continue Jardiance - he does not want insulin during the hospital stay- CBGs are resonably controlled - last A1c was 7.0          Code Status: Full Code DVT prophylaxis:   apixaban (ELIQUIS) tablet 5 mg   Consultants: orthopedic surgery Level of Care: Level of care: Telemetry  Medical Total time on patient care: 35   Objective:   Vitals:   07/24/22 0805 07/24/22 1016 07/24/22 2030 07/25/22 0821  BP:  118/65 (!) 123/50 103/66  Pulse:    71  Resp:  '18 17 18  '$ Temp: 97.9 F (36.6 C)  97.8 F (36.6 C) 97.9 F (36.6 C)  TempSrc:   Oral Oral  SpO2: 100%  100% 100%  Weight:      Height:       Filed Weights   07/21/22 1345 07/22/22 0427 07/23/22 0745  Weight: 85.8 kg 85.2 kg 84.2 kg   Exam: General exam: Appears comfortable  HEENT: oral mucosa moist Respiratory system: Clear to auscultation.  Cardiovascular system: S1 & S2 heard  Gastrointestinal system: Abdomen soft, non-tender, nondistended. Normal bowel sounds   Extremities: No cyanosis, clubbing or edema Psychiatry:  Mood & affect appropriate.    CBC: Recent Labs  Lab 07/21/22 0053 07/21/22 0830 07/22/22 0019 07/24/22 0908  WBC 7.7  --  4.8 4.6  HGB 11.8* 10.6* 10.0* 10.0*  HCT 37.9* 34.4* 32.1* 31.2*  MCV 87.3  --  85.1 84.3  PLT 159  --  152 381    Basic Metabolic Panel: Recent Labs  Lab 07/21/22 0053 07/22/22 0019 07/23/22 0259 07/24/22 0908 07/25/22 0331  NA 141 138 139 136 139  K 3.7 3.6 3.5 3.3* 3.9  CL 102 103 100 98 104  CO2 '22 27 30 28 28  '$ GLUCOSE 159* 119* 129* 141* 97  BUN '14 10 12 12 '$ 16  CREATININE 1.02 0.98 0.99 0.85 1.05  CALCIUM 9.2 8.4* 8.8* 8.2* 8.2*  MG  --  1.8 1.9 1.8  --     GFR: Estimated Creatinine Clearance: 52.4 mL/min (by C-G formula based on SCr of 1.05 mg/dL).  Scheduled Meds:  sodium chloride   Intravenous Once   acetaminophen  1,000 mg Oral QID   apixaban  5 mg Oral BID   atorvastatin  80 mg Oral Daily   brimonidine  1 drop Both Eyes BID   carvedilol  6.25 mg Oral BID WC   digoxin  0.125 mg Oral Daily   empagliflozin  25 mg Oral Daily   famotidine  20 mg Oral Daily   isosorbide mononitrate  30 mg Oral Daily   latanoprost  1 drop Both Eyes QHS   loratadine  10 mg Oral Daily   sodium chloride flush  3 mL Intravenous Q12H    Continuous Infusions: Imaging and lab data was personally reviewed No results found.  LOS: 0 days   Author: Debbe Odea  07/25/2022 11:22 AM  To contact [facility]>   Check the care team in [facility] and look for the attending/consulting [place] provider listed  Log into [email or web address] and use [place] universal password   Go to> "[facility]"  and find provider  If you still have difficulty reaching the provider, please page the [facility] (Director on Call) for the Hospitalists listed on amion

## 2022-07-25 NOTE — Progress Notes (Signed)
Occupational Therapy Treatment Patient Details Name: Vincent [name] MRN: [id number] DOB: [birth year] Today's Date: 07/25/2022   History of present illness 86 yo male s/p fall with L hip. CT () displaced superior pubic ramus extending into the acetabulum and anterior column on the L. Non displaced fx of posterior wall of acetabulum, mildly displaced fx of the inferior pubic ramus on L. Strandy hemorrhage along the lateral pelvic wall on L. Per Dr. Sheela Stack non operative. PMH HF with reduced EF, permanent ARIB on anticoagulation, CAD s/p PCI to RCA, DM, CVA with residual L weakness, L drop foot , chronic wounds,gluacoma   OT comments  Patient received in supine and agreeable to OT/PT session. Patient stating no pain except with movement. Patient required mod assist x2 to get to EOB due to pain and increased time. Patient instructed on transfer to recliner with mod assist x2 to stand and mod assist to transfer with frequent verbal cues. Patient performed stand from recliner and reaching tasks to address standing tolerance and balance. Patient positioned in recliner for comfort. Acute OT to continue to follow.    Recommendations for follow up therapy are one component of a multi-disciplinary discharge planning process, led by the attending physician.  Recommendations may be updated based on patient status, additional functional criteria and insurance authorization.    Follow Up Recommendations  Skilled nursing-short term rehab (<3 hours/day)     Assistance Recommended at Discharge Intermittent Supervision/Assistance  Patient can return home with the following  Two people to help with walking and/or transfers;Two people to help with bathing/dressing/bathroom;Assist for transportation   Equipment Recommendations  Wheelchair (measurements OT);Wheelchair cushion (measurements OT);BSC/3in1;Other (comment)    Recommendations for Other Services      Precautions / Restrictions  Precautions Precautions: Fall Precaution Comments: incontinence of bladder- recommend brief Restrictions Weight Bearing Restrictions: Yes LLE Weight Bearing: Weight bearing as tolerated       Mobility Bed Mobility Overal bed mobility: Needs Assistance Bed Mobility: Supine to Sit     Supine to sit: Mod assist, +2 for physical assistance     General bed mobility comments: difficulty getting to EOB due to pain requiring assistance to scoot hips forward with bed pads and assistance with BLEs    Transfers Overall transfer level: Needs assistance Equipment used: Rolling walker (2 wheels) Transfers: Sit to/from Stand, Bed to chair/wheelchair/BSC Sit to Stand: Mod assist, +2 physical assistance     Step pivot transfers: Mod assist     General transfer comment: difficulty with sit to stands due to complaints of pain, verbal cues throughout for transfer and hand placement     Balance Overall balance assessment: Needs assistance Sitting-balance support: No upper extremity supported, Feet supported Sitting balance-Leahy Scale: Good     Standing balance support: Single extremity supported, Bilateral upper extremity supported, During functional activity Standing balance-Leahy Scale: Poor Standing balance comment: performed reaching tasks with min assist for balance                           ADL either performed or assessed with clinical judgement   ADL Overall ADL's : Needs assistance/impaired                 Upper Body Dressing : Sitting;Minimal assistance Upper Body Dressing Details (indicate cue type and reason): to donn gown to cover back                   General ADL Comments:  focused on transfers and standing tolerance/balance    Extremity/Trunk Assessment              Vision       Perception     Praxis      Cognition Arousal/Alertness: Awake/alert Behavior During Therapy: WFL for tasks assessed/performed Overall Cognitive  Status: Impaired/Different from baseline Area of Impairment: Awareness, Following commands                       Following Commands: Follows multi-step commands with increased time   Awareness: Emergent   General Comments: no complaints of pain except during mobility        Exercises      Shoulder Instructions       General Comments VSS on RA    Pertinent Vitals/ Pain       Pain Assessment Pain Assessment: Faces Faces Pain Scale: Hurts even more Pain Location: R LE Pain Descriptors / Indicators: Discomfort, Aching, Guarding Pain Intervention(s): Limited activity within patient's tolerance, Monitored during session, Repositioned, Premedicated before session  Home Living                                          Prior Functioning/Environment              Frequency  Min 2X/week        Progress Toward Goals  OT Goals(current goals can now be found in the care plan section)  Progress towards OT goals: Progressing toward goals  Acute Rehab OT Goals Patient Stated Goal: get better OT Goal Formulation: With patient Time For Goal Achievement: 08/05/22 Potential to Achieve Goals: Good ADL Goals Pt Will Perform Lower Body Dressing: with max assist;sit to/from stand;with adaptive equipment Pt Will Transfer to Toilet: with max assist;stand pivot transfer;bedside commode Additional ADL Goal #1: pt will complete bed mobility min (A) as precursor to adls. Additional ADL Goal #2: Pt will complete sit <>stand mod (A) as precursor to adls. Additional ADL Goal #3: Pt will tolerate static standing bil LE for 1 minute as precursor to sink level care  Plan Discharge plan remains appropriate    Co-evaluation    PT/OT/SLP Co-Evaluation/Treatment: Yes Reason for Co-Treatment: For patient/therapist safety;To address functional/ADL transfers   OT goals addressed during session: Strengthening/ROM      AM-PAC OT "6 Clicks" Daily Activity      Outcome Measure   Help from another person eating meals?: A Little Help from another person taking care of personal grooming?: A Little Help from another person toileting, which includes using toliet, bedpan, or urinal?: A Lot Help from another person bathing (including washing, rinsing, drying)?: A Lot Help from another person to put on and taking off regular upper body clothing?: A Lot Help from another person to put on and taking off regular lower body clothing?: A Lot 6 Click Score: 14    End of Session Equipment Utilized During Treatment: Gait belt;Rolling walker (2 wheels)  OT Visit Diagnosis: Unsteadiness on feet (R26.81);Muscle weakness (generalized) (M62.81);Pain Pain - Right/Left: Right Pain - part of body: Leg;Hip   Activity Tolerance Patient tolerated treatment well   Patient Left in chair;with call bell/phone within reach;with chair alarm set   Nurse Communication Mobility status;Need for lift equipment (possible Stedy to return to bed)        Time: 6301-6010 OT Time Calculation (min): 32 min  Charges: OT General Charges $OT Visit: 1 Visit OT Treatments $Therapeutic Activity: 8-22 mins  Lodema Hong, [place]  Office [place] 07/25/2022, 2:40 PM

## 2022-07-25 NOTE — Progress Notes (Signed)
Physical Therapy Treatment Patient Details Name: Vincent [name] MRN: [id number] DOB: Nov 25, 1935 Today's Date: 07/25/2022   History of Present Illness 86 yo male s/p fall with L hip. CT () displaced superior pubic ramus extending into the acetabulum and anterior column on the L. Non displaced fx of posterior wall of acetabulum, mildly displaced fx of the inferior pubic ramus on L. Strandy hemorrhage along the lateral pelvic wall on L. Per Dr. Sheela Stack non operative. PMH HF with reduced EF, permanent ARIB on anticoagulation, CAD s/p PCI to RCA, DM, CVA with residual L weakness, L drop foot , chronic wounds,gluacoma    PT Comments    Pt received in supine, agreeable to therapy session after premedication for pain and after eating lunch. Pt with good effort but limited in standing and gait tolerance due to pain/anticipation of pain. Pt needing up to +2 modA for bed mobility and transfers and max cues for step sequencing/body mechanics throughout. Pt needing increased time/effort to perform all tasks. Pt up in chair with alarm on for safety at end of session, pt weaned to RA and RN notified, SpO2 WFL resting and with exertion during session. Pt continues to benefit from PT services to progress toward functional mobility goals.    Recommendations for follow up therapy are one component of a multi-disciplinary discharge planning process, led by the attending physician.  Recommendations may be updated based on patient status, additional functional criteria and insurance authorization.  Follow Up Recommendations  Skilled nursing-short term rehab (<3 hours/day) Can patient physically be transported by private vehicle: No   Assistance Recommended at Discharge Frequent or constant Supervision/Assistance  Patient can return home with the following Two people to help with walking and/or transfers;A lot of help with bathing/dressing/bathroom;Assistance with cooking/housework;Assist for transportation;Help with  stairs or ramp for entrance   Equipment Recommendations  Rolling walker (2 wheels);BSC/3in1    Recommendations for Other Services       Precautions / Restrictions Precautions Precautions: Fall Precaution Comments: incontinence of bladder- recommend brief Restrictions Weight Bearing Restrictions: Yes LLE Weight Bearing: Weight bearing as tolerated     Mobility  Bed Mobility Overal bed mobility: Needs Assistance Bed Mobility: Supine to Sit     Supine to sit: Mod assist, +2 for physical assistance     General bed mobility comments: difficulty getting to EOB due to pain requiring assistance to scoot hips forward with bed pads and assistance with BLEs; attempted with +1 assist but ultimately pt needed +2 due to pain/guarding    Transfers Overall transfer level: Needs assistance Equipment used: Rolling walker (2 wheels) Transfers: Sit to/from Stand, Bed to chair/wheelchair/BSC Sit to Stand: Mod assist, +2 physical assistance, From elevated surface   Step pivot transfers: Mod assist, +2 safety/equipment       General transfer comment: difficulty with sit to stands due to complaints of pain, verbal cues throughout for transfer and hand placement; max step sequencing cues during pivotal steps from bed>chair; pt also able to stand from elevated recliner seat with +2 lift assist.    Ambulation/Gait Ambulation/Gait assistance: Min assist, +2 safety/equipment   Assistive device: Rolling walker (2 wheels) Gait Pattern/deviations: Shuffle, Step-to pattern, Antalgic, Decreased weight shift to right     Pre-gait activities: standing at RW: hip flexion on LLE x10 reps; pt attempted on RLE but unable to raise foot off floor due to guarding/pain in LLE single leg stance; wtih pivotal steps ~88f, pt shuffling RLE due to LLE pain with stepping  Stairs             Wheelchair Mobility    Modified [name] (Stroke Patients Only)       Balance Overall balance assessment:  Needs assistance Sitting-balance support: No upper extremity supported, Feet supported Sitting balance-Leahy Scale: Good     Standing balance support: Single extremity supported, Bilateral upper extremity supported, During functional activity Standing balance-Leahy Scale: Poor Standing balance comment: performed reaching tasks with min assist for balance                            Cognition Arousal/Alertness: Awake/alert Behavior During Therapy: WFL for tasks assessed/performed Overall Cognitive Status: Impaired/Different from baseline Area of Impairment: Awareness, Following commands                       Following Commands: Follows multi-step commands with increased time   Awareness: Emergent   General Comments: no complaints of pain except during mobility; pt seeming to call out in anticipation of pain, prior to moving extremity at times.        Exercises General Exercises - Lower Extremity Ankle Circles/Pumps: AROM, Both, 10 reps, Supine Quad Sets: AROM, Both, 5 reps, Supine Heel Slides: AROM, AAROM, Left, 5 reps, Supine Hip ABduction/ADduction: AAROM, Left, 5 reps, Supine Hip Flexion/Marching: AROM, Left, 10 reps, Standing    General Comments General comments (skin integrity, edema, etc.): SpO2 WFL on RA (received on 2L O2 [place]), pt weaned to RA and RN notified; pt fingers cold so with warm blanket placed over his hand, the pulse oximeter had a better signal      Pertinent Vitals/Pain Pain Assessment Pain Assessment: Faces Faces Pain Scale: Hurts even more Pain Location: R LE Pain Descriptors / Indicators: Discomfort, Aching, Guarding, Grimacing, Moaning Pain Intervention(s): Limited activity within patient's tolerance, Monitored during session, Premedicated before session, Repositioned, Patient requesting pain meds-RN notified, Other (comment) (pt defers ice pack)    Home Living                          Prior Function             PT Goals (current goals can now be found in the care plan section) Acute Rehab PT Goals Patient Stated Goal: Progress and be able to go home again PT Goal Formulation: With patient Time For Goal Achievement: 08/05/22 Progress towards PT goals: Progressing toward goals    Frequency    Min 3X/week      PT Plan Current plan remains appropriate    Co-evaluation PT/OT/SLP Co-Evaluation/Treatment: Yes Reason for Co-Treatment: For patient/therapist safety;To address functional/ADL transfers PT goals addressed during session: Mobility/safety with mobility;Balance;Proper use of DME;Strengthening/ROM OT goals addressed during session: Strengthening/ROM      AM-PAC PT "6 Clicks" Mobility   Outcome Measure  Help needed turning from your back to your side while in a flat bed without using bedrails?: A Lot Help needed moving from lying on your back to sitting on the side of a flat bed without using bedrails?: A Lot Help needed moving to and from a bed to a chair (including a wheelchair)?: A Lot Help needed standing up from a chair using your arms (e.g., wheelchair or bedside chair)?: A Lot Help needed to walk in hospital room?: Total Help needed climbing 3-5 steps with a railing? : Total 6 Click Score: 10    End of Session Equipment Utilized  During Treatment: Gait belt Activity Tolerance: Patient tolerated treatment well;Patient limited by pain Patient left: in chair;with call bell/phone within reach;with chair alarm set;Other (comment) (pt requesting his feet down at this time) Nurse Communication: Mobility status;Need for lift equipment;Other (comment);Patient requests pain meds (Stedy vs +2 with RW to pivot back to bed from chair) PT Visit Diagnosis: Unsteadiness on feet (R26.81);Pain;Difficulty in walking, not elsewhere classified (R26.2) Pain - Right/Left: Left Pain - part of body: Hip (and posterior pelvic/sacral pain)     Time: 9847-3085 PT Time Calculation (min) (ACUTE  ONLY): 32 min  Charges:  $Therapeutic Activity: 8-22 mins                     Sharmayne Jablon P., PTA Acute Rehabilitation Services Secure Chat Preferred 9a-5:30pm Office: [place] 07/25/2022, 3:17 PM

## 2022-07-26 DIAGNOSIS — S32591A Other specified fracture of right pubis, initial encounter for closed fracture: Secondary | ICD-10-CM | POA: Diagnosis not present

## 2022-07-26 DIAGNOSIS — I959 Hypotension, unspecified: Secondary | ICD-10-CM

## 2022-07-26 DIAGNOSIS — S32592A Other specified fracture of left pubis, initial encounter for closed fracture: Secondary | ICD-10-CM

## 2022-07-26 LAB — GLUCOSE, CAPILLARY
Glucose-Capillary: 112 mg/dL — ABNORMAL HIGH (ref 70–99)
Glucose-Capillary: 92 mg/dL (ref 70–99)
Glucose-Capillary: 107 mg/dL — ABNORMAL HIGH (ref 70–99)
Glucose-Capillary: 141 mg/dL — ABNORMAL HIGH (ref 70–99)

## 2022-07-26 MED ORDER — OXYCODONE HCL 5 MG PO TABS
5.0000 mg | ORAL_TABLET | ORAL | Status: DC | PRN
  Administered 2022-07-26: 5 mg via ORAL
  Filled 2022-07-26: qty 1

## 2022-07-26 MED ORDER — SACUBITRIL-VALSARTAN 97-103 MG PO TABS
1.0000 | ORAL_TABLET | Freq: Two times a day (BID) | ORAL | Status: DC
  Administered 2022-07-26 – 2022-07-27 (×3): 1 via ORAL
  Filled 2022-07-26 (×5): qty 1

## 2022-07-26 MED ORDER — SPIRONOLACTONE 25 MG PO TABS
25.0000 mg | ORAL_TABLET | Freq: Every day | ORAL | Status: DC
  Administered 2022-07-26 – 2022-07-27 (×2): 25 mg via ORAL
  Filled 2022-07-26 (×2): qty 1

## 2022-07-26 MED ORDER — FUROSEMIDE 40 MG PO TABS
40.0000 mg | ORAL_TABLET | Freq: Every day | ORAL | Status: DC
  Administered 2022-07-26 – 2022-07-27 (×2): 40 mg via ORAL
  Filled 2022-07-26 (×2): qty 1

## 2022-07-26 NOTE — Progress Notes (Incomplete)
Secretary to order silver hydro fiber Ag+ for dressing change.  None is currently on the unit.   07/26/2022 Time: 2008  All items received for dressing change.  Will do tonight.  Time: 2326 Dressing change done.

## 2022-07-26 NOTE — Progress Notes (Signed)
Mobility Specialist Progress Note   07/26/22 1220  Mobility  Activity Transferred from chair to bed  Level of Assistance +2 (takes two people)  Assistive Device Stedy  LLE Weight Bearing WBAT  Activity Response Tolerated well  $Mobility charge 1 Mobility   Pre Mobility: 43 HR, 95% SpO2 on RA  During Mobility: 68 HR, 98% SpO2 on RA  Post Mobility: 64 HR, 133/71 BP, 100% SpO2 on RA   Received pt in chair asleep but easily aroused. Transferred pt via stedy requiring +2A for safety + min cues for foot and hand placement. No physical assist on rise from chair but minA to get pt back supine in bed d/t limited strength. Left w/ call bell in reach and bed alarm on.   [place] Mobility Specialist Please contact via SecureChat or  Rehab office at [phone]

## 2022-07-26 NOTE — Progress Notes (Signed)
Did not do dressing changes via patient request not to because he could not tolerate it while we were awaiting new PRN pain medication orders. He did get the new PRN oxycodone will pass on dressing change to next shift in report.

## 2022-07-26 NOTE — TOC Progression Note (Addendum)
Transition of Care [facility]) - Progression Note    Patient Details  Name: Vincent [name] MRN: [id number] Date of Birth: Apr 11, 1936  Transition of Care [facility]) CM/SW Contact  Joanne Chars, LCSW Phone Number: 07/26/2022, 10:57 AM  Clinical Narrative:   Bed offers presented to pt with choice document.  Pt would like to accept offer at [facility]  Received confirmation from [facility] that they can accept tomorrow.   Auth request submitted in [place] and approved: [id number], 3 days: 11/29-12/1.      Expected Discharge Plan: [place] Barriers to Discharge: Continued Medical Work up, SNF Pending bed offer  Expected Discharge Plan and Services Expected Discharge Plan: [place] In-house Referral: Clinical Social Work   Post Acute Care Choice: [place] Living arrangements for the past 2 months: Single Family Home                                       Social Determinants of Health (SDOH) Interventions    Readmission Risk Interventions     No data to display

## 2022-07-26 NOTE — Progress Notes (Deleted)
[facility] Progress Note  Patient: Vincent [name]     XMI:[id number]  DOA: 07/20/2022   PCP: [name], MD       Brief hospital course: This is an 86 year old male with congestive heart failure, atrial fibrillation, coronary artery disease status post PCI, diabetes mellitus, CVA who presents to the hospital after a fall.  He states that he was trying to climb the stairs when he lost his balance and fell. In the ED, he was noted to have superior and inferior pubic rami fractures and fracture of the posterior wall of the acetabulum.  Per orthopedic surgery, there is no need for surgery at this time.  We are managing him with pain medications and awaiting a PT OT evaluation.  Subjective:  No longer feeling lightheaded.  Assessment and Plan: Principal Problem:   Bilateral pubic rami fractures ([place])   Acetabular fracture ([place]) - PT recommends SNF- [facility] can accept tomorrow - cont pain control  Active Problems:  Feeling light headed- hypotensive - SBP ~ 100, not hypoxic, no drop in Hgb - he may be orthostatic- unable to stand for orthostatics due to fracture and pain with weight bearing - given a small NS bolus yesterday - BP improved and slightly elevated now - resume diuretics today   Permanent atrial fibrillation- Long term current use of anticoagulant Chronic systolic heart failure - ECHO 8/23> EF 25-30% with global hypokinesis of LV -Continue Eliquis, coreg and Digoxin     Normocytic anemia - Hgb ~ 10  Hypokalemia - replaced - follow    Controlled type 2 diabetes mellitus without complication, without long-term current use of insulin ([place]) -Continue Jardiance - he does not want insulin during the hospital stay- CBGs are resonably controlled - last A1c was 7.0          Code Status: Full Code DVT prophylaxis:   apixaban (ELIQUIS) tablet 5 mg   Consultants: orthopedic surgery Level of Care: Level of care: Telemetry Medical Total time on patient  care: 35   Objective:   Vitals:   07/25/22 1437 07/25/22 2118 07/26/22 0445 07/26/22 0823  BP: (!) 149/136 (!) 152/70 (!) 146/87 125/65  Pulse: (!) 105 90 63 71  Resp: '18 18 18 18  '$ Temp: 97.8 F (36.6 C) (!) 97.3 F (36.3 C)  98.3 F (36.8 C)  TempSrc: Oral Oral    SpO2: 100% 90% (!) 85% 99%  Weight:      Height:       Filed Weights   07/21/22 1345 07/22/22 0427 07/23/22 0745  Weight: 85.8 kg 85.2 kg 84.2 kg   Exam: General exam: Appears comfortable  HEENT: oral mucosa moist Respiratory system: Clear to auscultation.  Cardiovascular system: S1 & S2 heard  Gastrointestinal system: Abdomen soft, non-tender, nondistended. Normal bowel sounds   Extremities: No cyanosis, clubbing or edema Psychiatry:  Mood & affect appropriate.     CBC: Recent Labs  Lab 07/21/22 0053 07/21/22 0830 07/22/22 0019 07/24/22 0908  WBC 7.7  --  4.8 4.6  HGB 11.8* 10.6* 10.0* 10.0*  HCT 37.9* 34.4* 32.1* 31.2*  MCV 87.3  --  85.1 84.3  PLT 159  --  152 825    Basic Metabolic Panel: Recent Labs  Lab 07/21/22 0053 07/22/22 0019 07/23/22 0259 07/24/22 0908 07/25/22 0331  NA 141 138 139 136 139  K 3.7 3.6 3.5 3.3* 3.9  CL 102 103 100 98 104  CO2 '22 27 30 28 28  '$ GLUCOSE 159* 119* 129* 141* 97  BUN '14 10 12 12 16  '$ CREATININE 1.02 0.98 0.99 0.85 1.05  CALCIUM 9.2 8.4* 8.8* 8.2* 8.2*  MG  --  1.8 1.9 1.8  --     GFR: Estimated Creatinine Clearance: 52.4 mL/min (by C-G formula based on SCr of 1.05 mg/dL).  Scheduled Meds:  acetaminophen  1,000 mg Oral QID   apixaban  5 mg Oral BID   atorvastatin  80 mg Oral Daily   brimonidine  1 drop Both Eyes BID   carvedilol  6.25 mg Oral BID WC   digoxin  0.125 mg Oral Daily   empagliflozin  25 mg Oral Daily   famotidine  20 mg Oral Daily   furosemide  40 mg Oral Daily   isosorbide mononitrate  30 mg Oral Daily   latanoprost  1 drop Both Eyes QHS   loratadine  10 mg Oral Daily   sacubitril-valsartan  1 tablet Oral BID   sodium  chloride flush  3 mL Intravenous Q12H   spironolactone  25 mg Oral Daily   Continuous Infusions: Imaging and lab data was personally reviewed No results found.  LOS: 1 day   Author: Debbe Odea  07/26/2022 11:32 AM  To contact [facility]>   Check the care team in [facility] and look for the attending/consulting [place] provider listed  Log into [email or web address] and use [place]'s universal password   Go to> "[facility]"  and find provider  If you still have difficulty reaching the provider, please page the [facility] (Director on Call) for the Hospitalists listed on amion

## 2022-07-26 NOTE — Progress Notes (Addendum)
CCMD called  stating patient sustaining a low heart rate MD notified (Rizwan Saima). Patient placed back on O2 1 liter. Patient resting well in chair, no new orders.

## 2022-07-26 NOTE — Progress Notes (Signed)
[facility] Progress Note  Patient: Vincent [name]     RAQ:[id number]  DOA: 07/20/2022   PCP: [name], MD       Brief hospital course: This is an 86 year old male with congestive heart failure, atrial fibrillation, coronary artery disease status post PCI, diabetes mellitus, CVA who presents to the hospital after a fall.  He states that he was trying to climb the stairs when he lost his balance and fell. In the ED, he was noted to have superior and inferior pubic rami fractures and fracture of the posterior wall of the acetabulum.  Per orthopedic surgery, there is no need for surgery at this time.    Insurance auth obtained for [facility] can accept tomorrow  Subjective:  No longer feeling lightheaded.  Assessment and Plan: Principal Problem:   Bilateral pubic rami fractures ([place])   Acetabular fracture ([place]) - PT recommends SNF - cont pain control  Active Problems:  Feeling light headed- hypotensive - SBP ~ 100, not hypoxic, no drop in Hgb - he may be orthostatic- unable to stand for orthostatics due to fracture and pain with weight bearing - given a small NS bolus yesterday - BP improved and slightly elevated now - resume diuretics today   Permanent atrial fibrillation- Long term current use of anticoagulant Chronic systolic heart failure - ECHO 8/23> EF 25-30% with global hypokinesis of LV -Continue Eliquis, coreg and Digoxin     Normocytic anemia - Hgb ~ 10  Hypokalemia - replaced - follow    Controlled type 2 diabetes mellitus without complication, without long-term current use of insulin ([place]) -Continue Jardiance - he does not want insulin during the hospital stay- CBGs are resonably controlled - last A1c was 7.0          Code Status: Full Code DVT prophylaxis:   apixaban (ELIQUIS) tablet 5 mg   Consultants: orthopedic surgery Level of Care: Level of care: Telemetry Medical Total time on patient care: 35   Objective:   Vitals:    07/25/22 1437 07/25/22 2118 07/26/22 0445 07/26/22 0823  BP: (!) 149/136 (!) 152/70 (!) 146/87 125/65  Pulse: (!) 105 90 63 71  Resp: '18 18 18 18  '$ Temp: 97.8 F (36.6 C) (!) 97.3 F (36.3 C)  98.3 F (36.8 C)  TempSrc: Oral Oral    SpO2: 100% 90% (!) 85% 99%  Weight:      Height:       Filed Weights   07/21/22 1345 07/22/22 0427 07/23/22 0745  Weight: 85.8 kg 85.2 kg 84.2 kg   Exam: General exam: Appears comfortable  HEENT: oral mucosa moist Respiratory system: Clear to auscultation.  Cardiovascular system: S1 & S2 heard  Gastrointestinal system: Abdomen soft, non-tender, nondistended. Normal bowel sounds   Extremities: No cyanosis, clubbing or edema Psychiatry:  Mood & affect appropriate.     CBC: Recent Labs  Lab 07/21/22 0053 07/21/22 0830 07/22/22 0019 07/24/22 0908  WBC 7.7  --  4.8 4.6  HGB 11.8* 10.6* 10.0* 10.0*  HCT 37.9* 34.4* 32.1* 31.2*  MCV 87.3  --  85.1 84.3  PLT 159  --  152 456    Basic Metabolic Panel: Recent Labs  Lab 07/21/22 0053 07/22/22 0019 07/23/22 0259 07/24/22 0908 07/25/22 0331  NA 141 138 139 136 139  K 3.7 3.6 3.5 3.3* 3.9  CL 102 103 100 98 104  CO2 '22 27 30 28 28  '$ GLUCOSE 159* 119* 129* 141* 97  BUN 14 10 12  12 16  CREATININE 1.02 0.98 0.99 0.85 1.05  CALCIUM 9.2 8.4* 8.8* 8.2* 8.2*  MG  --  1.8 1.9 1.8  --     GFR: Estimated Creatinine Clearance: 52.4 mL/min (by C-G formula based on SCr of 1.05 mg/dL).  Scheduled Meds:  acetaminophen  1,000 mg Oral QID   apixaban  5 mg Oral BID   atorvastatin  80 mg Oral Daily   brimonidine  1 drop Both Eyes BID   carvedilol  6.25 mg Oral BID WC   digoxin  0.125 mg Oral Daily   empagliflozin  25 mg Oral Daily   famotidine  20 mg Oral Daily   furosemide  40 mg Oral Daily   isosorbide mononitrate  30 mg Oral Daily   latanoprost  1 drop Both Eyes QHS   loratadine  10 mg Oral Daily   sacubitril-valsartan  1 tablet Oral BID   sodium chloride flush  3 mL Intravenous Q12H    spironolactone  25 mg Oral Daily   Continuous Infusions: Imaging and lab data was personally reviewed No results found.  LOS: 1 day   Author: Debbe Odea  07/26/2022 12:05 PM  To contact [facility]>   Check the care team in [facility] and look for the attending/consulting [facility] provider listed  Log into [email or web address] and use [place]'s universal password   Go to> "[facility]"  and find provider  If you still have difficulty reaching the provider, please page the [facility] (Director on Call) for the Hospitalists listed on amion

## 2022-07-26 NOTE — Plan of Care (Signed)

## 2022-07-27 DIAGNOSIS — I6930 Unspecified sequelae of cerebral infarction: Secondary | ICD-10-CM

## 2022-07-27 DIAGNOSIS — I4821 Permanent atrial fibrillation: Secondary | ICD-10-CM

## 2022-07-27 DIAGNOSIS — I2510 Atherosclerotic heart disease of native coronary artery without angina pectoris: Secondary | ICD-10-CM | POA: Diagnosis not present

## 2022-07-27 DIAGNOSIS — S32425D Nondisplaced fracture of posterior wall of left acetabulum, subsequent encounter for fracture with routine healing: Secondary | ICD-10-CM | POA: Diagnosis not present

## 2022-07-27 DIAGNOSIS — Z741 Need for assistance with personal care: Secondary | ICD-10-CM | POA: Diagnosis not present

## 2022-07-27 DIAGNOSIS — I10 Essential (primary) hypertension: Secondary | ICD-10-CM | POA: Diagnosis not present

## 2022-07-27 DIAGNOSIS — I69359 Hemiplegia and hemiparesis following cerebral infarction affecting unspecified side: Secondary | ICD-10-CM | POA: Diagnosis not present

## 2022-07-27 DIAGNOSIS — D649 Anemia, unspecified: Secondary | ICD-10-CM

## 2022-07-27 DIAGNOSIS — S32592D Other specified fracture of left pubis, subsequent encounter for fracture with routine healing: Secondary | ICD-10-CM | POA: Diagnosis not present

## 2022-07-27 DIAGNOSIS — E785 Hyperlipidemia, unspecified: Secondary | ICD-10-CM | POA: Diagnosis not present

## 2022-07-27 DIAGNOSIS — I5022 Chronic systolic (congestive) heart failure: Secondary | ICD-10-CM | POA: Diagnosis not present

## 2022-07-27 DIAGNOSIS — R2681 Unsteadiness on feet: Secondary | ICD-10-CM | POA: Diagnosis not present

## 2022-07-27 DIAGNOSIS — T148XXA Other injury of unspecified body region, initial encounter: Secondary | ICD-10-CM

## 2022-07-27 DIAGNOSIS — M6259 Muscle wasting and atrophy, not elsewhere classified, multiple sites: Secondary | ICD-10-CM | POA: Diagnosis not present

## 2022-07-27 DIAGNOSIS — S32455A Nondisplaced transverse fracture of left acetabulum, initial encounter for closed fracture: Secondary | ICD-10-CM

## 2022-07-27 DIAGNOSIS — M84359A Stress fracture, hip, unspecified, initial encounter for fracture: Secondary | ICD-10-CM | POA: Diagnosis not present

## 2022-07-27 DIAGNOSIS — R41841 Cognitive communication deficit: Secondary | ICD-10-CM | POA: Diagnosis not present

## 2022-07-27 DIAGNOSIS — E876 Hypokalemia: Secondary | ICD-10-CM

## 2022-07-27 DIAGNOSIS — M6281 Muscle weakness (generalized): Secondary | ICD-10-CM | POA: Diagnosis not present

## 2022-07-27 DIAGNOSIS — E119 Type 2 diabetes mellitus without complications: Secondary | ICD-10-CM | POA: Diagnosis not present

## 2022-07-27 DIAGNOSIS — S329XXD Fracture of unspecified parts of lumbosacral spine and pelvis, subsequent encounter for fracture with routine healing: Secondary | ICD-10-CM | POA: Diagnosis not present

## 2022-07-27 LAB — BASIC METABOLIC PANEL
Anion gap: 9 (ref 5–15)
BUN: 18 mg/dL (ref 8–23)
CO2: 25 mmol/L (ref 22–32)
Calcium: 8.3 mg/dL — ABNORMAL LOW (ref 8.9–10.3)
Chloride: 101 mmol/L (ref 98–111)
Creatinine, Ser: 0.9 mg/dL (ref 0.61–1.24)
GFR, Estimated: >60 mL/min (ref >60)
Glucose, Bld: 105 mg/dL — ABNORMAL HIGH (ref 70–99)
Potassium: 4 mmol/L (ref 3.5–5.1)
Sodium: 135 mmol/L (ref 135–145)

## 2022-07-27 LAB — GLUCOSE, CAPILLARY
Glucose-Capillary: 157 mg/dL — ABNORMAL HIGH (ref 70–99)
Glucose-Capillary: 112 mg/dL — ABNORMAL HIGH (ref 70–99)
Glucose-Capillary: 106 mg/dL — ABNORMAL HIGH (ref 70–99)

## 2022-07-27 MED ORDER — OXYCODONE HCL 5 MG PO TABS: 5.0000 mg | ORAL_TABLET | Freq: Four times a day (QID) | ORAL | 0 refills | Status: DC | PRN

## 2022-07-27 MED ORDER — OXYCODONE HCL 5 MG PO TABS: 5.0000 mg | ORAL_TABLET | Freq: Four times a day (QID) | ORAL | 0 refills | Status: AC | PRN

## 2022-07-27 NOTE — Progress Notes (Signed)
Physical Therapy Treatment Patient Details Name: Vincent [name] ANTOLIN MRN: [id number] DOB: 1935/11/04 Today's Date: 07/27/2022   History of Present Illness 86 yo male s/p fall with L hip. CT () displaced superior pubic ramus extending into the acetabulum and anterior column on the L. Non displaced fx of posterior wall of acetabulum, mildly displaced fx of the inferior pubic ramus on L. Strandy hemorrhage along the lateral pelvic wall on L. Per Dr. Sheela Stack non operative. PMH HF with reduced EF, permanent ARIB on anticoagulation, CAD s/p PCI to RCA, DM, CVA with residual L weakness, L foot drop, chronic wounds, glaucoma.    PT Comments    Pt received in supine, he was c/o frustration with positioning in bed and generalized discomfort. Mr Bolin was agreeable to therapy session for seated/supine LE exercises and seated activity however refusing OOB to chair or standing transfers. Pt appearing more anxious this date and demos some possible delirium but following simple commands today with increased time, he performs bed mobility with up to +53moA and needs up to maxA for lateral seated scooting toward HOB. Good tolerance for BLE exercises but pt needing multimodal cues for proper technique/reps today. Pt continues to benefit from PT services to progress toward functional mobility goals.    Recommendations for follow up therapy are one component of a multi-disciplinary discharge planning process, led by the attending physician.  Recommendations may be updated based on patient status, additional functional criteria and insurance authorization.  Follow Up Recommendations  Skilled nursing-short term rehab (<3 hours/day) Can patient physically be transported by private vehicle: No   Assistance Recommended at Discharge Frequent or constant Supervision/Assistance  Patient can return home with the following Two people to help with walking and/or transfers;A lot of help with bathing/dressing/bathroom;Assistance  with cooking/housework;Assist for transportation;Help with stairs or ramp for entrance   Equipment Recommendations  Rolling walker (2 wheels);BSC/3in1    Recommendations for Other Services       Precautions / Restrictions Precautions Precautions: Fall Precaution Comments: incontinence of bladder- recommend brief; intermittent delirium Restrictions Weight Bearing Restrictions: Yes LLE Weight Bearing: Weight bearing as tolerated     Mobility  Bed Mobility Overal bed mobility: Needs Assistance Bed Mobility: Sit to Supine, Supine to Sit     Supine to sit: Mod assist, +2 for physical assistance, HOB elevated Sit to supine: Mod assist   General bed mobility comments: difficulty getting to EOB due to pain requiring assistance to scoot hips forward with bed pads and assistance with BLEs; attempted with +1 assist but ultimately pt needed +2 due to pain/guarding; pt able to return to supine with BLE assist over edge of bed and able to use bed rails to guide his trunk back to supine.    Transfers Overall transfer level: Needs assistance   Transfers: Bed to chair/wheelchair/BSC            Lateral/Scoot Transfers: Max assist General transfer comment: defer sit to stands due to complaints of pain and pt adamant about not wanting to transfer OOB to chair; pt agreeable to seated exercises and lateral seated scooting toward HOB. Pt needing maxA with bed pad to achieve signifcant hip clearance/lateral scoots.    Ambulation/Gait               General Gait Details: defer due to pt anxiety/apparent discomfort.      Balance Overall balance assessment: Needs assistance Sitting-balance support: No upper extremity supported, Feet supported Sitting balance-Leahy Scale: Fair Sitting balance - Comments: pt tending  to prefer BUE support       Standing balance comment: pt defers                            Cognition Arousal/Alertness: Awake/alert Behavior During  Therapy: Agitated, Anxious Overall Cognitive Status: Impaired/Different from baseline Area of Impairment: Awareness, Following commands, Memory, Problem solving                     Memory: Decreased short-term memory Following Commands: Follows one step commands with increased time, Follows multi-step commands inconsistently   Awareness: Intellectual Problem Solving: Decreased initiation, Difficulty sequencing, Requires verbal cues, Requires tactile cues General Comments: Pt easily frustrated this date, benefits from discussion of plan and simple 1-step cues this date; Pt frustrated when asked about pain but does c/o discomfort with mobility. Pt adamantly refusing transfer to chair.        Exercises General Exercises - Lower Extremity Ankle Circles/Pumps: AROM, Both, 10 reps, Supine Long Arc Quad: AROM, Both, 10 reps, Seated Heel Slides: AROM, Left, 5 reps, Supine Hip ABduction/ADduction: AAROM, Supine, AROM, Both, 10 reps, Seated Straight Leg Raises: AAROM, Both, 5 reps, Supine Hip Flexion/Marching: AROM, 10 reps, Both, Seated Other Exercises Other Exercises: chair push-ups with emphasis on B tricep activation x10 reps seated EOB    General Comments General comments (skin integrity, edema, etc.): Pt reports improved comfort once repositioned in supine after performing seated strengthening/scooting tasks      Pertinent Vitals/Pain Pain Assessment Pain Assessment: PAINAD Breathing: normal Negative Vocalization: occasional moan/groan, low speech, negative/disapproving quality Facial Expression: sad, frightened, frown Body Language: relaxed Consolability: distracted or reassured by voice/touch PAINAD Score: 3 Pain Location: LLE, back, generalized Pain Descriptors / Indicators: Discomfort, Aching, Guarding, Grimacing, Moaning Pain Intervention(s): Monitored during session, Limited activity within patient's tolerance, Repositioned, Other (comment) ("it's not pain, it's  discomfort")    Home Living                          Prior Function            PT Goals (current goals can now be found in the care plan section) Acute Rehab PT Goals Patient Stated Goal: Progress and be able to go home again PT Goal Formulation: With patient Time For Goal Achievement: 08/05/22 Progress towards PT goals: Progressing toward goals    Frequency    Min 3X/week      PT Plan Current plan remains appropriate    Co-evaluation              AM-PAC PT "6 Clicks" Mobility   Outcome Measure  Help needed turning from your back to your side while in a flat bed without using bedrails?: A Lot Help needed moving from lying on your back to sitting on the side of a flat bed without using bedrails?: A Lot Help needed moving to and from a bed to a chair (including a wheelchair)?: A Lot Help needed standing up from a chair using your arms (e.g., wheelchair or bedside chair)?: Total Help needed to walk in hospital room?: Total Help needed climbing 3-5 steps with a railing? : Total 6 Click Score: 9    End of Session Equipment Utilized During Treatment: Gait belt Activity Tolerance: Patient tolerated treatment well;Patient limited by pain;Other (comment) (pt apparent anxiety) Patient left: with call bell/phone within reach;Other (comment);in bed;with bed alarm set (BLE elevated on pillows for edema reduction,  heels floated) Nurse Communication: Mobility status;Need for lift equipment Charlaine Dalton) PT Visit Diagnosis: Unsteadiness on feet (R26.81);Pain;Difficulty in walking, not elsewhere classified (R26.2) Pain - Right/Left: Left Pain - part of body: Hip (and posterior pelvic/sacral pain)     Time: 3685-9923 PT Time Calculation (min) (ACUTE ONLY): 22 min  Charges:  $Therapeutic Exercise: 8-22 mins                     Keyuna Cuthrell P., PTA Acute Rehabilitation Services Secure Chat Preferred 9a-5:30pm Office: Welcome 07/27/2022, 3:10 PM

## 2022-07-27 NOTE — Hospital Course (Signed)
Vincent [name] was admitted to the hospital with the working diagnosis of bilateral pubic rami fractures and acetabular fracture.   86 yo male with the past medical history of heart failure, atrial fibrillation, coronary artery disease, T2DM, CVA and chronic wounds who presented with left hip pain after a fall. He fell while climbing steps, landed on his left side. Post fall patient not able to bear weight on left lower extremity due to pain. On his initial physical examination his blood pressure was 140/88, HR 73, RR 19 and 02 saturation 98%, lungs with no wheezing or rales, heart with S1 and S2 present irregularly irregular with no gallops or murmurs, abdomen with no distention and no lower extremity edema.   Na 141, K 3,7 Cl 102 bicarbonate 22 glucose 159, bun 14 cr 1,0 Wbc 7,7 hgb 11.8 plt 159  Head CT with no acute changes, old right MCA distribution infarct and old bilateral cerebellar infarcts.  Cervical spine CT with no acute changes.   Left hip CT with minimally displaced fracture of the superior pubic ramus extending into the acetabulum and anterior column on the left.  Non displaced fracture of the posterior wall of the acetabulum.  Mildly displaced fracture of the inferior pubic ramus on the left.  Strandy hemorrhage alone the lateral pelvic wall on the left.   Chest radiograph with cardiomegaly with no infiltrates.   EKG 97 bpm, normal axis, normal intervals, atrial fibrillation rhythm with no significant ST segment or T wave changes.   Patient was placed on analgesics for pain control. Consulted orthopedics, PT and OT. Recommended continue with physical therapy, non operative interventions.

## 2022-07-27 NOTE — Assessment & Plan Note (Signed)
Glucose remained controlled during his hospitalization. Plan to continue with empagliflozin. Continue statin therapy with atorvastatin.

## 2022-07-27 NOTE — Assessment & Plan Note (Signed)
Present on admission, continue local wound care.

## 2022-07-27 NOTE — Assessment & Plan Note (Addendum)
Continue physical therapy.  Blood pressure and glucose control.  Anticoagulation with apixaban and statin therapy with atorvastatin

## 2022-07-27 NOTE — Progress Notes (Addendum)
Called back again, talked to the nurse and was able to give report. Called to give report to nurse receiving patient. Nurse was not available, was told to call back in 10 minutes.

## 2022-07-27 NOTE — TOC Transition Note (Signed)
Transition of Care [facility]) - CM/SW Discharge Note   Patient Details  Name: Vincent [name] MRN: [id number] Date of Birth: [birth year]  [facility] CM/SW Contact:  Joanne Chars, LCSW Phone Number: 07/27/2022, 2:08 PM   Clinical Narrative:   Pt discharging to [facility], room 306A.  RN call report to [phone].    Final next level of care: Skilled Nursing Facility Barriers to Discharge: Barriers Resolved   Patient Goals and CMS Choice Patient states their goals for this hospitalization and ongoing recovery are:: get well      Discharge Placement              Patient chooses bed at:  [facility]) Patient to be transferred to facility by: [facility] Name of family member notified: sister [name] Patient and family notified of of transfer: 07/27/22  Discharge Plan and Services In-house Referral: Clinical Social Work   Post Acute Care Choice: [place]                               Social Determinants of Health (SDOH) Interventions     Readmission Risk Interventions     No data to display

## 2022-07-27 NOTE — Assessment & Plan Note (Addendum)
Patient had no signs of heart failure exacerbation. He had a transient episode of hypotension that improved with IV fluids. At the time of his discharge will continue medical therapy with carvedilol, digoxin, entresto, isosorbide anad diuresis with spironolactone, furosemide and empagliflizin.

## 2022-07-27 NOTE — Assessment & Plan Note (Signed)
Atrial fibrillation remained rate controlled during his hospitalization.  Plan to continue with carvedilol for rate control and anticoagulation with apixaban.

## 2022-07-27 NOTE — Assessment & Plan Note (Signed)
Left acetabular fracture.

## 2022-07-27 NOTE — Assessment & Plan Note (Signed)
Cell count remained stable, no signs of worsening anemia. Anemia of chronic disease, his discharge hgb is 10.0

## 2022-07-27 NOTE — Assessment & Plan Note (Addendum)
Continue blood pressure control with carvedilol, isosorbide and entresto.

## 2022-07-27 NOTE — Assessment & Plan Note (Signed)
Electrolytes were corrected, at the time of his discharge serum K is 4,0 with serum cr of 0,90 and bicarbonate at 25.

## 2022-07-27 NOTE — Assessment & Plan Note (Signed)
Left pelvic fracture (no bilateral).  Pain is well controlled with oral analgesics. Plan to continue physical therapy at SNF.

## 2022-07-27 NOTE — Assessment & Plan Note (Signed)
No chest pain

## 2022-07-27 NOTE — Discharge Summary (Signed)
Physician Discharge Summary   Patient: Vincent [name] MRN: [id number] DOB: 1935-09-30  Admit date:     07/20/2022  Discharge date: 07/27/22  Discharge Physician: [name]   PCP: [name], MD   Recommendations at discharge:    Continue pain control with acetaminophen and oxycodone as needed. Follow up with Dr [name] in 7 to 10 days   Discharge Diagnoses: Principal Problem:   Pelvic fracture [name]) Active Problems:   Permanent atrial fibrillation (HCC)   Normocytic anemia   Chronic systolic CHF (congestive heart failure) (HCC)   Coronary atherosclerosis   Essential hypertension   Type 2 diabetes mellitus with hyperlipidemia ([place])   History of stroke with residual deficit   Chronic wound   Hypokalemia  Resolved Problems:   * No resolved hospital problems. [facility] Course: Mr. [name] was admitted to the hospital with the working diagnosis of bilateral pubic rami fractures and acetabular fracture.   86 yo male with the past medical history of heart failure, atrial fibrillation, coronary artery disease, T2DM, CVA and chronic wounds who presented with left hip pain after a fall. He fell while climbing steps, landed on his left side. Post fall patient not able to bear weight on left lower extremity due to pain. On his initial physical examination his blood pressure was 140/88, HR 73, RR 19 and 02 saturation 98%, lungs with no wheezing or rales, heart with S1 and S2 present irregularly irregular with no gallops or murmurs, abdomen with no distention and no lower extremity edema.   Na 141, K 3,7 Cl 102 bicarbonate 22 glucose 159, bun 14 cr 1,0 Wbc 7,7 hgb 11.8 plt 159  Head CT with no acute changes, old right MCA distribution infarct and old bilateral cerebellar infarcts.  Cervical spine CT with no acute changes.   Left hip CT with minimally displaced fracture of the superior pubic ramus extending into the acetabulum and anterior column on the left.  Non displaced  fracture of the posterior wall of the acetabulum.  Mildly displaced fracture of the inferior pubic ramus on the left.  Strandy hemorrhage alone the lateral pelvic wall on the left.   Chest radiograph with cardiomegaly with no infiltrates.   EKG 97 bpm, normal axis, normal intervals, atrial fibrillation rhythm with no significant ST segment or T wave changes.   Patient was placed on analgesics for pain control. Consulted orthopedics, PT and OT. Recommended continue with physical therapy, non operative interventions.   Assessment and Plan: * Pelvic fracture ([place]) Left pelvic fracture (no bilateral).  Pain is well controlled with oral analgesics. Plan to continue physical therapy at SNF.   Bilateral pubic rami fractures (HCC) Left acetabular fracture.   Permanent atrial fibrillation (HCC) Atrial fibrillation remained rate controlled during his hospitalization.  Plan to continue with carvedilol for rate control and anticoagulation with apixaban.   Normocytic anemia Cell count remained stable, no signs of worsening anemia. Anemia of chronic disease, his discharge hgb is 00.8   Chronic systolic CHF (congestive heart failure) ([place]) Patient had no signs of heart failure exacerbation. He had a transient episode of hypotension that improved with IV fluids. At the time of his discharge will continue medical therapy with carvedilol, digoxin, entresto, isosorbide anad diuresis with spironolactone, furosemide and empagliflizin.   Coronary atherosclerosis No chest pain.   Essential hypertension Continue blood pressure control with carvedilol, isosorbide and entresto.    Type 2 diabetes mellitus with hyperlipidemia (HCC) Glucose remained controlled during his hospitalization. Plan to  continue with empagliflozin. Continue statin therapy with atorvastatin.  History of stroke with residual deficit Continue physical therapy.  Blood pressure and glucose control.  Anticoagulation with  apixaban and statin therapy with atorvastatin   Chronic wound Present on admission, continue local wound care.   Hypokalemia Electrolytes were corrected, at the time of his discharge serum K is 4,0 with serum cr of 0,90 and bicarbonate at 25.          Consultants: wound care Procedures performed: none  Disposition: Skilled nursing facility Diet recommendation:  Discharge Diet Orders (From admission, onward)     Start     Ordered   07/27/22 0000  Diet - low sodium heart healthy        07/27/22 1217           Cardiac and Carb modified diet DISCHARGE MEDICATION: Allergies as of 07/27/2022   No Known Allergies      Medication List     TAKE these medications    acetaminophen 500 MG tablet Commonly known as: TYLENOL Take 2 tablets (1,000 mg total) by mouth every 6 (six) hours as needed. What changed: reasons to take this   ALPHAGAN P OP Place 1 drop into both eyes in the morning and at bedtime.   apixaban 5 MG Tabs tablet Commonly known as: ELIQUIS Take 1 tablet (5 mg total) by mouth 2 (two) times daily.   atorvastatin 80 MG tablet Commonly known as: LIPITOR Take 1 tablet (80 mg total) by mouth daily.   carvedilol 6.25 MG tablet Commonly known as: COREG Take 1 tablet (6.25 mg total) by mouth 2 (two) times daily with a meal.   digoxin 0.125 MG tablet Commonly known as: LANOXIN Take 1 tablet (0.125 mg total) by mouth daily.   famotidine 20 MG tablet Commonly known as: PEPCID Take 20 mg by mouth daily.   furosemide 40 MG tablet Commonly known as: LASIX Take 40 mg by mouth daily.   IRON PO Take 1 tablet by mouth daily.   isosorbide mononitrate 30 MG 24 hr tablet Commonly known as: IMDUR Take 1 tablet (30 mg total) by mouth daily.   Jardiance 25 MG Tabs tablet Generic drug: empagliflozin Take 25 mg by mouth daily.   loratadine 10 MG tablet Commonly known as: CLARITIN Take 10 mg by mouth daily.   Lumigan 0.01 % Soln Generic drug:  bimatoprost Place 1 drop into both eyes at bedtime.   Magnesium 400 MG Tabs Take 400 mg by mouth 2 (two) times daily.   metFORMIN 500 MG tablet Commonly known as: GLUCOPHAGE Take 500 mg by mouth 2 (two) times daily.   nitroGLYCERIN 0.4 MG/SPRAY spray Commonly known as: NITROLINGUAL Place 1 spray under the tongue every 5 (five) minutes x 3 doses as needed for chest pain.   OMEGA-3 FISH OIL PO Take 1 capsule by mouth daily.   OSCAL 500 D-3 PO Take 1 tablet by mouth 2 (two) times daily.   oxyCODONE 5 MG immediate release tablet Commonly known as: Oxy IR/ROXICODONE Take 1 tablet (5 mg total) by mouth every 6 (six) hours as needed for severe pain.   sacubitril-valsartan 97-103 MG Commonly known as: ENTRESTO Take 1 tablet by mouth 2 (two) times daily.   spironolactone 25 MG tablet Commonly known as: ALDACTONE Take 25 mg by mouth daily.   VITAMIN B-12 CR PO Take 1 tablet by mouth daily.               Discharge Care Instructions  (  From admission, onward)           Start     Ordered   07/27/22 0000  Discharge wound care:       Comments: Beginning 07/22/22 and 2x per week Q Tues/Friday per BEDSIDE NURSING Cleanse bilateral LE wounds with saline, pat dry. Cut to fit silver hydrofiber (aquacel Ag+) and place over open wounds, top with ABD pads, secure with kerlix (wrapping from toes to knees) bilaterally. Then apply 4" Coban over kerlix in same fashion from toes to knees.   07/27/22 1217            Contact information for after-discharge care     Destination     [facility] Preferred SNF .   Service: Skilled Nursing Contact information: [address] [place] [phone]                    Discharge Exam: Danley Danker Weights   07/21/22 1345 07/22/22 0427 07/23/22 0745  Weight: 85.8 kg 85.2 kg 84.2 kg   BP 136/76 (BP Location: Right Arm)   Pulse 66   Temp 98.3 F (36.8 C) (Oral)   Resp 18   Ht  '5\' 9"'$  (1.753 m)   Wt 84.2 kg   SpO2 95%   BMI 27.41 kg/m   Neurology awake and alert ENT with no pallor Cardiovascular with S1 and S2 present and rhythmic with no gallops, rubs or murmurs. No JVD Trace lower extremity edema (wraps in place) Respiratory with no rales or wheezing Abdomen with no distention   Condition at discharge: stable  The results of significant diagnostics from this hospitalization (including imaging, microbiology, ancillary and laboratory) are listed below for reference.   Imaging Studies: CT Hip Left Wo Contrast  Result Date: 07/21/2022 CLINICAL DATA:  Hip trauma, fracture suspected. EXAM: CT OF THE LEFT HIP WITHOUT CONTRAST TECHNIQUE: Multidetector CT imaging of the left hip was performed according to the standard protocol. Multiplanar CT image reconstructions were also generated. RADIATION DOSE REDUCTION: This exam was performed according to the departmental dose-optimization program which includes automated exposure control, adjustment of the mA and/or kV according to patient size and/or use of iterative reconstruction technique. COMPARISON:  07/20/2022. FINDINGS: Bones/Joint/Cartilage There is a minimally displaced fracture of the inferior pubic ramus on the left. There is a minimally displaced fracture of the left superior pubic ramus extending into the anterior column of the acetabulum. There is a nondisplaced fracture of the posterior wall of the acetabulum. The remaining bony structures are intact. No dislocation. Degenerative changes are noted at the left hip and in the lower lumbar spine. Ligaments Suboptimally assessed by CT. Muscles and Tendons No intramuscular hematoma. Soft tissues Atherosclerotic calcification of the aorta. Anasarca is noted. Colonic diverticulosis without diverticulitis. A small amount of blood products are noted along the lateral pelvis wall on the left in the region of pelvic fracture. Fluid attenuation is noted in the scrotum, possible  hydrocele. IMPRESSION: 1. Minimally displaced fracture of the superior pubic ramus extending into the acetabulum and anterior column on the left. 2. Nondisplaced fracture of the posterior wall of the acetabulum. 3. Mildly displaced fracture of the inferior pubic ramus on the left. 4. Strandy hemorrhage along the lateral pelvic wall on the left. 5. Aortic atherosclerosis. Electronically Signed   By: Brett Fairy M.D.   On: 07/21/2022 02:37   CT Cervical Spine Wo Contrast  Result Date: 07/20/2022 CLINICAL DATA:  Trauma. EXAM: CT CERVICAL SPINE  WITHOUT CONTRAST TECHNIQUE: Multidetector CT imaging of the cervical spine was performed without intravenous contrast. Multiplanar CT image reconstructions were also generated. RADIATION DOSE REDUCTION: This exam was performed according to the departmental dose-optimization program which includes automated exposure control, adjustment of the mA and/or kV according to patient size and/or use of iterative reconstruction technique. COMPARISON:  None Available. FINDINGS: Alignment: Normal. Skull base and vertebrae: No acute fracture. No primary bone lesion or focal pathologic process. Soft tissues and spinal canal: No prevertebral fluid or swelling. No visible canal hematoma. Disc levels: There is disc space narrowing and endplate osteophyte formation at C3-C4, C4-C5, C5-C6 and C6-C7 compatible with degenerative change. There is mild-to-moderate neural foraminal stenosis on the left at these levels with mild multilevel central canal stenosis. Upper chest: There is a small right pleural effusion. Other: There are atherosclerotic calcifications of the aorta. IMPRESSION: 1. No acute fracture or traumatic subluxation of the cervical spine. 2. Multilevel degenerative changes of the cervical spine. 3. Small right pleural effusion. Aortic Atherosclerosis (ICD10-I70.0). Electronically Signed   By: Ronney Asters M.D.   On: 07/20/2022 23:17   CT Head Wo Contrast  Result Date:  07/20/2022 CLINICAL DATA:  Head trauma EXAM: CT HEAD WITHOUT CONTRAST TECHNIQUE: Contiguous axial images were obtained from the base of the skull through the vertex without intravenous contrast. RADIATION DOSE REDUCTION: This exam was performed according to the departmental dose-optimization program which includes automated exposure control, adjustment of the mA and/or kV according to patient size and/or use of iterative reconstruction technique. COMPARISON:  CT head 06/10/2022.  MRI brain 03/19/2014. FINDINGS: Brain: There is no acute infarct, acute intracranial hemorrhage, extra-axial fluid collection, mass effect or midline shift. Large old right MCA distribution infarct appears unchanged from prior. Old bilateral cerebellar infarcts appear unchanged from prior. No hydrocephalus. Mild diffuse atrophy is stable. Vascular: Atherosclerotic calcifications are present within the cavernous internal carotid arteries. Skull: Normal. Negative for fracture or focal lesion. Sinuses/Orbits: No acute finding. Other: None. IMPRESSION: 1. No acute intracranial process. 2. Unchanged old right MCA distribution infarct and old bilateral cerebellar infarcts. Electronically Signed   By: Ronney Asters M.D.   On: 07/20/2022 23:06   DG Foot Complete Right  Result Date: 07/20/2022 CLINICAL DATA:  Left foot pain after a fall. EXAM: RIGHT FOOT COMPLETE - 3+ VIEW COMPARISON:  None Available. FINDINGS: Degenerative changes in the interphalangeal and metatarsal-phalangeal joints. Left hallux valgus deformity. No evidence of acute fracture or dislocation. Dorsal soft tissue swelling. Vascular calcifications. IMPRESSION: Degenerative changes in the left foot with hallux valgus deformity. No acute displaced fractures identified. Dorsal soft tissue swelling. Electronically Signed   By: Lucienne Capers M.D.   On: 07/20/2022 22:36   DG Chest 2 View  Result Date: 07/20/2022 CLINICAL DATA:  Left hip and foot pain after a fall. EXAM:  CHEST - 2 VIEW COMPARISON:  06/11/2022 FINDINGS: Loop recorder is present. Shallow inspiration. Heart size is enlarged. No vascular congestion, edema, or consolidation. Small bilateral pleural effusions are suggested. No pneumothorax. Visualized ribs are nondepressed. Degenerative changes in the spine and shoulders. IMPRESSION: Cardiac enlargement with small bilateral pleural effusions. Electronically Signed   By: Lucienne Capers M.D.   On: 07/20/2022 22:34   DG Hip Unilat W or Wo Pelvis 2-3 Views Left  Result Date: 07/20/2022 CLINICAL DATA:  Left hip and foot pain after a fall. EXAM: DG HIP (WITH OR WITHOUT PELVIS) 2-3V LEFT COMPARISON:  None Available. FINDINGS: Acute mildly displaced fractures identified in the left superior  and inferior pubic rami with superior ramus fracture extending to the base of the acetabulum. No fracture or dislocation suggested in the hip joint. SI joints and symphysis pubis are not displaced. Visualized sacrum appears intact. Vascular calcifications. IMPRESSION: Fracture of the left superior and inferior pubic rami extending to the central acetabulum. Electronically Signed   By: Lucienne Capers M.D.   On: 07/20/2022 22:32    Microbiology: Results for orders placed or performed during the hospital encounter of 09/17/21  Resp Panel by RT-PCR (Flu A&B, Covid) Nasopharyngeal Swab     Status: None   Collection Time: 09/17/21 12:54 PM   Specimen: Nasopharyngeal Swab; Nasopharyngeal(NP) swabs in vial transport medium  Result Value Ref Range Status   SARS Coronavirus 2 by RT PCR NEGATIVE NEGATIVE Final    Comment: (NOTE) SARS-CoV-2 target nucleic acids are NOT DETECTED.  The SARS-CoV-2 RNA is generally detectable in upper respiratory specimens during the acute phase of infection. The lowest concentration of SARS-CoV-2 viral copies this assay can detect is 138 copies/mL. A negative result does not preclude SARS-Cov-2 infection and should not be used as the sole basis for  treatment or other patient management decisions. A negative result may occur with  improper specimen collection/handling, submission of specimen other than nasopharyngeal swab, presence of viral mutation(s) within the areas targeted by this assay, and inadequate number of viral copies(<138 copies/mL). A negative result must be combined with clinical observations, patient history, and epidemiological information. The expected result is Negative.  Fact Sheet for Patients:  EntrepreneurPulse.com.au  Fact Sheet for Healthcare Providers:  IncredibleEmployment.be  This test is no t yet approved or cleared by the [place] FDA and  has been authorized for detection and/or diagnosis of SARS-CoV-2 by FDA under an Emergency Use Authorization (EUA). This EUA will remain  in effect (meaning this test can be used) for the duration of the COVID-19 declaration under Section 564(b)(1) of the Act, 21 U.S.C.section 360bbb-3(b)(1), unless the authorization is terminated  or revoked sooner.       Influenza A by PCR NEGATIVE NEGATIVE Final   Influenza B by PCR NEGATIVE NEGATIVE Final    Comment: (NOTE) The Xpert Xpress SARS-CoV-2/FLU/RSV plus assay is intended as an aid in the diagnosis of influenza from Nasopharyngeal swab specimens and should not be used as a sole basis for treatment. Nasal washings and aspirates are unacceptable for Xpert Xpress SARS-CoV-2/FLU/RSV testing.  Fact Sheet for Patients: EntrepreneurPulse.com.au  Fact Sheet for Healthcare Providers: IncredibleEmployment.be  This test is not yet approved or cleared by the [place] FDA and has been authorized for detection and/or diagnosis of SARS-CoV-2 by FDA under an Emergency Use Authorization (EUA). This EUA will remain in effect (meaning this test can be used) for the duration of the COVID-19 declaration under Section 564(b)(1) of the Act, 21  U.S.C. section 360bbb-3(b)(1), unless the authorization is terminated or revoked.  Performed at [facility], [place] [address], [place], [place] [address]     Labs: CBC: Recent Labs  Lab 07/21/22 0053 07/21/22 0830 07/22/22 0019 07/24/22 0908  WBC 7.7  --  4.8 4.6  HGB 11.8* 10.6* 10.0* 10.0*  HCT 37.9* 34.4* 32.1* 31.2*  MCV 87.3  --  85.1 84.3  PLT 159  --  152 924   Basic Metabolic Panel: Recent Labs  Lab 07/22/22 0019 07/23/22 0259 07/24/22 0908 07/25/22 0331 07/27/22 0504  NA 138 139 136 139 135  K 3.6 3.5 3.3* 3.9 4.0  CL 103 100 98  104 101  CO2 '27 30 28 28 25  '$ GLUCOSE 119* 129* 141* 97 105*  BUN '10 12 12 16 18  '$ CREATININE 0.98 0.99 0.85 1.05 0.90  CALCIUM 8.4* 8.8* 8.2* 8.2* 8.3*  MG 1.8 1.9 1.8  --   --    Liver Function Tests: Recent Labs  Lab 07/23/22 0259  AST 20  ALT 19  ALKPHOS 67  BILITOT 2.2*  PROT 6.1*  ALBUMIN 2.8*   CBG: Recent Labs  Lab 07/26/22 1153 07/26/22 1642 07/26/22 1941 07/27/22 0829 07/27/22 1135  GLUCAP 92 107* 141* 157* 112*    Discharge time spent: greater than 30 minutes.  Signed: Tawni Millers, MD [facility] 07/27/2022

## 2022-07-28 DIAGNOSIS — M84359A Stress fracture, hip, unspecified, initial encounter for fracture: Secondary | ICD-10-CM | POA: Diagnosis not present

## 2022-07-28 DIAGNOSIS — I10 Essential (primary) hypertension: Secondary | ICD-10-CM | POA: Diagnosis not present

## 2022-07-28 DIAGNOSIS — E785 Hyperlipidemia, unspecified: Secondary | ICD-10-CM | POA: Diagnosis not present

## 2022-08-02 DIAGNOSIS — M84359A Stress fracture, hip, unspecified, initial encounter for fracture: Secondary | ICD-10-CM | POA: Diagnosis not present

## 2022-08-04 ENCOUNTER — Telehealth (HOSPITAL_COMMUNITY): Payer: Self-pay | Admitting: Surgery

## 2022-08-04 NOTE — Telephone Encounter (Signed)
I called Vincent [name] regarding his Zio monitor applied in [facility]. The company reached out to say that the return of this monitor has been delayed.  I left a message requesting that he call me back with any updates regarding the monitor.

## 2022-08-05 DIAGNOSIS — I10 Essential (primary) hypertension: Secondary | ICD-10-CM | POA: Diagnosis not present

## 2022-08-05 DIAGNOSIS — E785 Hyperlipidemia, unspecified: Secondary | ICD-10-CM | POA: Diagnosis not present

## 2022-08-05 DIAGNOSIS — M84359A Stress fracture, hip, unspecified, initial encounter for fracture: Secondary | ICD-10-CM | POA: Diagnosis not present

## 2022-08-08 ENCOUNTER — Ambulatory Visit: Payer: Medicare Other | Admitting: Podiatry

## 2022-08-08 DIAGNOSIS — E785 Hyperlipidemia, unspecified: Secondary | ICD-10-CM | POA: Diagnosis not present

## 2022-08-08 DIAGNOSIS — I10 Essential (primary) hypertension: Secondary | ICD-10-CM | POA: Diagnosis not present

## 2022-08-08 DIAGNOSIS — M84359A Stress fracture, hip, unspecified, initial encounter for fracture: Secondary | ICD-10-CM | POA: Diagnosis not present

## 2022-08-15 DIAGNOSIS — I10 Essential (primary) hypertension: Secondary | ICD-10-CM | POA: Diagnosis not present

## 2022-08-15 DIAGNOSIS — M84359A Stress fracture, hip, unspecified, initial encounter for fracture: Secondary | ICD-10-CM | POA: Diagnosis not present

## 2022-09-05 ENCOUNTER — Other Ambulatory Visit: Payer: Self-pay

## 2022-09-05 ENCOUNTER — Encounter (HOSPITAL_COMMUNITY): Admission: EM | Disposition: E | Payer: Self-pay | Source: Home / Self Care | Attending: Neurology

## 2022-09-05 ENCOUNTER — Emergency Department (HOSPITAL_COMMUNITY): Payer: Medicare Other

## 2022-09-05 ENCOUNTER — Encounter (HOSPITAL_COMMUNITY): Payer: Self-pay | Admitting: Emergency Medicine

## 2022-09-05 ENCOUNTER — Emergency Department (HOSPITAL_COMMUNITY): Payer: Medicare Other | Admitting: Anesthesiology

## 2022-09-05 ENCOUNTER — Inpatient Hospital Stay (HOSPITAL_COMMUNITY)
Admission: EM | Admit: 2022-09-05 | Discharge: 2022-09-29 | DRG: 023 | Disposition: E | Payer: Medicare Other | Attending: Neurology | Admitting: Neurology

## 2022-09-05 DIAGNOSIS — I614 Nontraumatic intracerebral hemorrhage in cerebellum: Secondary | ICD-10-CM | POA: Diagnosis not present

## 2022-09-05 DIAGNOSIS — Z9911 Dependence on respirator [ventilator] status: Secondary | ICD-10-CM | POA: Diagnosis not present

## 2022-09-05 DIAGNOSIS — R42 Dizziness and giddiness: Secondary | ICD-10-CM

## 2022-09-05 DIAGNOSIS — I63332 Cerebral infarction due to thrombosis of left posterior cerebral artery: Secondary | ICD-10-CM | POA: Diagnosis not present

## 2022-09-05 DIAGNOSIS — I616 Nontraumatic intracerebral hemorrhage, multiple localized: Secondary | ICD-10-CM | POA: Diagnosis not present

## 2022-09-05 DIAGNOSIS — Z955 Presence of coronary angioplasty implant and graft: Secondary | ICD-10-CM

## 2022-09-05 DIAGNOSIS — I6389 Other cerebral infarction: Secondary | ICD-10-CM | POA: Diagnosis not present

## 2022-09-05 DIAGNOSIS — T827XXA Infection and inflammatory reaction due to other cardiac and vascular devices, implants and grafts, initial encounter: Secondary | ICD-10-CM | POA: Diagnosis not present

## 2022-09-05 DIAGNOSIS — I426 Alcoholic cardiomyopathy: Secondary | ICD-10-CM | POA: Diagnosis present

## 2022-09-05 DIAGNOSIS — E1165 Type 2 diabetes mellitus with hyperglycemia: Secondary | ICD-10-CM | POA: Diagnosis present

## 2022-09-05 DIAGNOSIS — T82898A Other specified complication of vascular prosthetic devices, implants and grafts, initial encounter: Secondary | ICD-10-CM | POA: Diagnosis not present

## 2022-09-05 DIAGNOSIS — Z87891 Personal history of nicotine dependence: Secondary | ICD-10-CM | POA: Diagnosis not present

## 2022-09-05 DIAGNOSIS — E1141 Type 2 diabetes mellitus with diabetic mononeuropathy: Secondary | ICD-10-CM | POA: Diagnosis present

## 2022-09-05 DIAGNOSIS — I651 Occlusion and stenosis of basilar artery: Secondary | ICD-10-CM | POA: Diagnosis not present

## 2022-09-05 DIAGNOSIS — R0602 Shortness of breath: Secondary | ICD-10-CM | POA: Diagnosis not present

## 2022-09-05 DIAGNOSIS — I4821 Permanent atrial fibrillation: Secondary | ICD-10-CM | POA: Diagnosis present

## 2022-09-05 DIAGNOSIS — I110 Hypertensive heart disease with heart failure: Secondary | ICD-10-CM | POA: Diagnosis not present

## 2022-09-05 DIAGNOSIS — Z7189 Other specified counseling: Secondary | ICD-10-CM | POA: Diagnosis not present

## 2022-09-05 DIAGNOSIS — I252 Old myocardial infarction: Secondary | ICD-10-CM

## 2022-09-05 DIAGNOSIS — E1142 Type 2 diabetes mellitus with diabetic polyneuropathy: Secondary | ICD-10-CM | POA: Diagnosis present

## 2022-09-05 DIAGNOSIS — R471 Dysarthria and anarthria: Secondary | ICD-10-CM | POA: Diagnosis present

## 2022-09-05 DIAGNOSIS — R001 Bradycardia, unspecified: Secondary | ICD-10-CM | POA: Diagnosis not present

## 2022-09-05 DIAGNOSIS — S0990XA Unspecified injury of head, initial encounter: Secondary | ICD-10-CM | POA: Diagnosis not present

## 2022-09-05 DIAGNOSIS — R188 Other ascites: Secondary | ICD-10-CM | POA: Diagnosis not present

## 2022-09-05 DIAGNOSIS — I878 Other specified disorders of veins: Secondary | ICD-10-CM | POA: Diagnosis present

## 2022-09-05 DIAGNOSIS — R1310 Dysphagia, unspecified: Secondary | ICD-10-CM | POA: Diagnosis present

## 2022-09-05 DIAGNOSIS — Z515 Encounter for palliative care: Secondary | ICD-10-CM | POA: Diagnosis not present

## 2022-09-05 DIAGNOSIS — E876 Hypokalemia: Secondary | ICD-10-CM | POA: Diagnosis not present

## 2022-09-05 DIAGNOSIS — Y92009 Unspecified place in unspecified non-institutional (private) residence as the place of occurrence of the external cause: Secondary | ICD-10-CM

## 2022-09-05 DIAGNOSIS — Z9889 Other specified postprocedural states: Secondary | ICD-10-CM

## 2022-09-05 DIAGNOSIS — I6523 Occlusion and stenosis of bilateral carotid arteries: Secondary | ICD-10-CM | POA: Diagnosis present

## 2022-09-05 DIAGNOSIS — R7881 Bacteremia: Secondary | ICD-10-CM | POA: Diagnosis not present

## 2022-09-05 DIAGNOSIS — R531 Weakness: Secondary | ICD-10-CM | POA: Diagnosis not present

## 2022-09-05 DIAGNOSIS — J9601 Acute respiratory failure with hypoxia: Secondary | ICD-10-CM | POA: Diagnosis not present

## 2022-09-05 DIAGNOSIS — I6322 Cerebral infarction due to unspecified occlusion or stenosis of basilar arteries: Secondary | ICD-10-CM | POA: Diagnosis not present

## 2022-09-05 DIAGNOSIS — Z79899 Other long term (current) drug therapy: Secondary | ICD-10-CM

## 2022-09-05 DIAGNOSIS — E870 Hyperosmolality and hypernatremia: Secondary | ICD-10-CM | POA: Diagnosis not present

## 2022-09-05 DIAGNOSIS — J811 Chronic pulmonary edema: Secondary | ICD-10-CM | POA: Diagnosis not present

## 2022-09-05 DIAGNOSIS — J9611 Chronic respiratory failure with hypoxia: Secondary | ICD-10-CM

## 2022-09-05 DIAGNOSIS — G319 Degenerative disease of nervous system, unspecified: Secondary | ICD-10-CM | POA: Diagnosis not present

## 2022-09-05 DIAGNOSIS — J90 Pleural effusion, not elsewhere classified: Secondary | ICD-10-CM | POA: Diagnosis not present

## 2022-09-05 DIAGNOSIS — N400 Enlarged prostate without lower urinary tract symptoms: Secondary | ICD-10-CM | POA: Diagnosis present

## 2022-09-05 DIAGNOSIS — I63343 Cerebral infarction due to thrombosis of bilateral cerebellar arteries: Secondary | ICD-10-CM

## 2022-09-05 DIAGNOSIS — E440 Moderate protein-calorie malnutrition: Secondary | ICD-10-CM | POA: Diagnosis present

## 2022-09-05 DIAGNOSIS — K8020 Calculus of gallbladder without cholecystitis without obstruction: Secondary | ICD-10-CM | POA: Diagnosis not present

## 2022-09-05 DIAGNOSIS — R918 Other nonspecific abnormal finding of lung field: Secondary | ICD-10-CM | POA: Diagnosis not present

## 2022-09-05 DIAGNOSIS — M21371 Foot drop, right foot: Secondary | ICD-10-CM | POA: Diagnosis present

## 2022-09-05 DIAGNOSIS — I69322 Dysarthria following cerebral infarction: Secondary | ICD-10-CM | POA: Diagnosis not present

## 2022-09-05 DIAGNOSIS — I639 Cerebral infarction, unspecified: Secondary | ICD-10-CM | POA: Diagnosis present

## 2022-09-05 DIAGNOSIS — Z66 Do not resuscitate: Secondary | ICD-10-CM | POA: Diagnosis not present

## 2022-09-05 DIAGNOSIS — R042 Hemoptysis: Secondary | ICD-10-CM | POA: Diagnosis not present

## 2022-09-05 DIAGNOSIS — G9389 Other specified disorders of brain: Secondary | ICD-10-CM | POA: Diagnosis not present

## 2022-09-05 DIAGNOSIS — I5042 Chronic combined systolic (congestive) and diastolic (congestive) heart failure: Secondary | ICD-10-CM | POA: Diagnosis present

## 2022-09-05 DIAGNOSIS — E871 Hypo-osmolality and hyponatremia: Secondary | ICD-10-CM | POA: Diagnosis present

## 2022-09-05 DIAGNOSIS — Z96642 Presence of left artificial hip joint: Secondary | ICD-10-CM | POA: Diagnosis present

## 2022-09-05 DIAGNOSIS — I82612 Acute embolism and thrombosis of superficial veins of left upper extremity: Secondary | ICD-10-CM | POA: Diagnosis not present

## 2022-09-05 DIAGNOSIS — R29810 Facial weakness: Secondary | ICD-10-CM | POA: Diagnosis present

## 2022-09-05 DIAGNOSIS — S199XXA Unspecified injury of neck, initial encounter: Secondary | ICD-10-CM | POA: Diagnosis not present

## 2022-09-05 DIAGNOSIS — Z1152 Encounter for screening for COVID-19: Secondary | ICD-10-CM

## 2022-09-05 DIAGNOSIS — R29715 NIHSS score 15: Secondary | ICD-10-CM | POA: Diagnosis present

## 2022-09-05 DIAGNOSIS — Z8042 Family history of malignant neoplasm of prostate: Secondary | ICD-10-CM

## 2022-09-05 DIAGNOSIS — I63443 Cerebral infarction due to embolism of bilateral cerebellar arteries: Secondary | ICD-10-CM | POA: Diagnosis not present

## 2022-09-05 DIAGNOSIS — Z8249 Family history of ischemic heart disease and other diseases of the circulatory system: Secondary | ICD-10-CM

## 2022-09-05 DIAGNOSIS — B9561 Methicillin susceptible Staphylococcus aureus infection as the cause of diseases classified elsewhere: Secondary | ICD-10-CM | POA: Diagnosis not present

## 2022-09-05 DIAGNOSIS — J690 Pneumonitis due to inhalation of food and vomit: Secondary | ICD-10-CM | POA: Diagnosis not present

## 2022-09-05 DIAGNOSIS — R112 Nausea with vomiting, unspecified: Secondary | ICD-10-CM

## 2022-09-05 DIAGNOSIS — R1110 Vomiting, unspecified: Secondary | ICD-10-CM | POA: Diagnosis not present

## 2022-09-05 DIAGNOSIS — I6329 Cerebral infarction due to unspecified occlusion or stenosis of other precerebral arteries: Secondary | ICD-10-CM | POA: Diagnosis present

## 2022-09-05 DIAGNOSIS — R600 Localized edema: Secondary | ICD-10-CM | POA: Diagnosis not present

## 2022-09-05 DIAGNOSIS — I482 Chronic atrial fibrillation, unspecified: Secondary | ICD-10-CM | POA: Diagnosis not present

## 2022-09-05 DIAGNOSIS — L538 Other specified erythematous conditions: Secondary | ICD-10-CM | POA: Diagnosis not present

## 2022-09-05 DIAGNOSIS — G936 Cerebral edema: Secondary | ICD-10-CM | POA: Diagnosis not present

## 2022-09-05 DIAGNOSIS — M21372 Foot drop, left foot: Secondary | ICD-10-CM | POA: Diagnosis present

## 2022-09-05 DIAGNOSIS — Z7901 Long term (current) use of anticoagulants: Secondary | ICD-10-CM

## 2022-09-05 DIAGNOSIS — Y848 Other medical procedures as the cause of abnormal reaction of the patient, or of later complication, without mention of misadventure at the time of the procedure: Secondary | ICD-10-CM | POA: Diagnosis not present

## 2022-09-05 DIAGNOSIS — I6340 Cerebral infarction due to embolism of unspecified cerebral artery: Secondary | ICD-10-CM | POA: Diagnosis not present

## 2022-09-05 DIAGNOSIS — I6381 Other cerebral infarction due to occlusion or stenosis of small artery: Secondary | ICD-10-CM | POA: Diagnosis not present

## 2022-09-05 DIAGNOSIS — J9690 Respiratory failure, unspecified, unspecified whether with hypoxia or hypercapnia: Secondary | ICD-10-CM | POA: Diagnosis not present

## 2022-09-05 DIAGNOSIS — J449 Chronic obstructive pulmonary disease, unspecified: Secondary | ICD-10-CM | POA: Diagnosis present

## 2022-09-05 DIAGNOSIS — I2510 Atherosclerotic heart disease of native coronary artery without angina pectoris: Secondary | ICD-10-CM

## 2022-09-05 DIAGNOSIS — I255 Ischemic cardiomyopathy: Secondary | ICD-10-CM | POA: Diagnosis not present

## 2022-09-05 DIAGNOSIS — Z4682 Encounter for fitting and adjustment of non-vascular catheter: Secondary | ICD-10-CM | POA: Diagnosis not present

## 2022-09-05 DIAGNOSIS — Z8673 Personal history of transient ischemic attack (TIA), and cerebral infarction without residual deficits: Secondary | ICD-10-CM | POA: Diagnosis not present

## 2022-09-05 DIAGNOSIS — G9340 Encephalopathy, unspecified: Secondary | ICD-10-CM | POA: Diagnosis not present

## 2022-09-05 DIAGNOSIS — Z800 Family history of malignant neoplasm of digestive organs: Secondary | ICD-10-CM

## 2022-09-05 DIAGNOSIS — I509 Heart failure, unspecified: Secondary | ICD-10-CM | POA: Diagnosis not present

## 2022-09-05 DIAGNOSIS — J9621 Acute and chronic respiratory failure with hypoxia: Secondary | ICD-10-CM | POA: Diagnosis present

## 2022-09-05 DIAGNOSIS — R509 Fever, unspecified: Secondary | ICD-10-CM | POA: Diagnosis not present

## 2022-09-05 DIAGNOSIS — E785 Hyperlipidemia, unspecified: Secondary | ICD-10-CM | POA: Diagnosis present

## 2022-09-05 DIAGNOSIS — W19XXXA Unspecified fall, initial encounter: Principal | ICD-10-CM | POA: Diagnosis present

## 2022-09-05 DIAGNOSIS — R319 Hematuria, unspecified: Secondary | ICD-10-CM | POA: Diagnosis not present

## 2022-09-05 DIAGNOSIS — E1169 Type 2 diabetes mellitus with other specified complication: Secondary | ICD-10-CM | POA: Diagnosis present

## 2022-09-05 DIAGNOSIS — K219 Gastro-esophageal reflux disease without esophagitis: Secondary | ICD-10-CM | POA: Diagnosis present

## 2022-09-05 DIAGNOSIS — J9811 Atelectasis: Secondary | ICD-10-CM | POA: Diagnosis not present

## 2022-09-05 DIAGNOSIS — Z7984 Long term (current) use of oral hypoglycemic drugs: Secondary | ICD-10-CM

## 2022-09-05 DIAGNOSIS — M25552 Pain in left hip: Secondary | ICD-10-CM | POA: Diagnosis not present

## 2022-09-05 DIAGNOSIS — I6302 Cerebral infarction due to thrombosis of basilar artery: Secondary | ICD-10-CM

## 2022-09-05 DIAGNOSIS — J9602 Acute respiratory failure with hypercapnia: Secondary | ICD-10-CM | POA: Diagnosis not present

## 2022-09-05 DIAGNOSIS — M109 Gout, unspecified: Secondary | ICD-10-CM | POA: Diagnosis present

## 2022-09-05 DIAGNOSIS — I808 Phlebitis and thrombophlebitis of other sites: Secondary | ICD-10-CM | POA: Diagnosis not present

## 2022-09-05 DIAGNOSIS — H5500 Unspecified nystagmus: Secondary | ICD-10-CM | POA: Diagnosis present

## 2022-09-05 HISTORY — PX: RADIOLOGY WITH ANESTHESIA: SHX6223

## 2022-09-05 LAB — COMPREHENSIVE METABOLIC PANEL
ALT: 48 U/L — ABNORMAL HIGH (ref 0–44)
AST: 64 U/L — ABNORMAL HIGH (ref 15–41)
Albumin: 3.3 g/dL — ABNORMAL LOW (ref 3.5–5.0)
Alkaline Phosphatase: 144 U/L — ABNORMAL HIGH (ref 38–126)
Anion gap: 8 (ref 5–15)
BUN: 8 mg/dL (ref 8–23)
CO2: 26 mmol/L (ref 22–32)
Calcium: 8.8 mg/dL — ABNORMAL LOW (ref 8.9–10.3)
Chloride: 106 mmol/L (ref 98–111)
Creatinine, Ser: 1.09 mg/dL (ref 0.61–1.24)
GFR, Estimated: >60 mL/min (ref >60)
Glucose, Bld: 152 mg/dL — ABNORMAL HIGH (ref 70–99)
Potassium: 3.3 mmol/L — ABNORMAL LOW (ref 3.5–5.1)
Sodium: 140 mmol/L (ref 135–145)
Total Bilirubin: 1.8 mg/dL — ABNORMAL HIGH (ref 0.3–1.2)
Total Protein: 6.9 g/dL (ref 6.5–8.1)

## 2022-09-05 LAB — CBC WITH DIFFERENTIAL/PLATELET
Abs Immature Granulocytes: 0.02 10*3/uL (ref 0.00–0.07)
Basophils Absolute: 0 10*3/uL (ref 0.0–0.1)
Basophils Relative: 1 %
Eosinophils Absolute: 0.1 10*3/uL (ref 0.0–0.5)
Eosinophils Relative: 2 %
HCT: 36.7 % — ABNORMAL LOW (ref 39.0–52.0)
Hemoglobin: 11.1 g/dL — ABNORMAL LOW (ref 13.0–17.0)
Immature Granulocytes: 1 %
Lymphocytes Relative: 12 %
Lymphs Abs: 0.5 10*3/uL — ABNORMAL LOW (ref 0.7–4.0)
MCH: 26.7 pg (ref 26.0–34.0)
MCHC: 30.2 g/dL (ref 30.0–36.0)
MCV: 88.2 fL (ref 80.0–100.0)
Monocytes Absolute: 0.4 10*3/uL (ref 0.1–1.0)
Monocytes Relative: 10 %
Neutro Abs: 2.9 10*3/uL (ref 1.7–7.7)
Neutrophils Relative %: 74 %
Platelets: 149 10*3/uL — ABNORMAL LOW (ref 150–400)
RBC: 4.16 MIL/uL — ABNORMAL LOW (ref 4.22–5.81)
RDW: 18.4 % — ABNORMAL HIGH (ref 11.5–15.5)
WBC: 3.8 10*3/uL — ABNORMAL LOW (ref 4.0–10.5)
nRBC: 0 % (ref 0.0–0.2)

## 2022-09-05 LAB — CK: Total CK: 59 U/L (ref 49–397)

## 2022-09-05 LAB — I-STAT CHEM 8, ED
BUN: 8 mg/dL (ref 8–23)
Calcium, Ion: 1.18 mmol/L (ref 1.15–1.40)
Chloride: 103 mmol/L (ref 98–111)
Creatinine, Ser: 1 mg/dL (ref 0.61–1.24)
Glucose, Bld: 153 mg/dL — ABNORMAL HIGH (ref 70–99)
HCT: 36 % — ABNORMAL LOW (ref 39.0–52.0)
Hemoglobin: 12.2 g/dL — ABNORMAL LOW (ref 13.0–17.0)
Potassium: 3.7 mmol/L (ref 3.5–5.1)
Sodium: 144 mmol/L (ref 135–145)
TCO2: 28 mmol/L (ref 22–32)

## 2022-09-05 LAB — I-STAT VENOUS BLOOD GAS, ED
Acid-Base Excess: 3 mmol/L — ABNORMAL HIGH (ref 0.0–2.0)
Bicarbonate: 29.5 mmol/L — ABNORMAL HIGH (ref 20.0–28.0)
Calcium, Ion: 1.19 mmol/L (ref 1.15–1.40)
HCT: 36 % — ABNORMAL LOW (ref 39.0–52.0)
Hemoglobin: 12.2 g/dL — ABNORMAL LOW (ref 13.0–17.0)
O2 Saturation: 25 %
Potassium: 3.7 mmol/L (ref 3.5–5.1)
Sodium: 143 mmol/L (ref 135–145)
TCO2: 31 mmol/L (ref 22–32)
pCO2, Ven: 52.2 mmHg (ref 44–60)
pH, Ven: 7.36 (ref 7.25–7.43)
pO2, Ven: 18 mmHg — CL (ref 32–45)

## 2022-09-05 LAB — RESP PANEL BY RT-PCR (RSV, FLU A&B, COVID)  RVPGX2
Influenza A by PCR: NEGATIVE
Influenza B by PCR: NEGATIVE
Resp Syncytial Virus by PCR: NEGATIVE
SARS Coronavirus 2 by RT PCR: NEGATIVE

## 2022-09-05 LAB — TROPONIN I (HIGH SENSITIVITY)
Troponin I (High Sensitivity): 60 ng/L — ABNORMAL HIGH (ref <18)
Troponin I (High Sensitivity): 58 ng/L — ABNORMAL HIGH (ref <18)

## 2022-09-05 LAB — LACTIC ACID, PLASMA
Lactic Acid, Venous: 2.7 mmol/L (ref 0.5–1.9)
Lactic Acid, Venous: 1.6 mmol/L (ref 0.5–1.9)

## 2022-09-05 LAB — BRAIN NATRIURETIC PEPTIDE: B Natriuretic Peptide: 3009.2 pg/mL — ABNORMAL HIGH (ref 0.0–100.0)

## 2022-09-05 LAB — DIGOXIN LEVEL: Digoxin Level: <0.2 ng/mL — ABNORMAL LOW (ref 0.8–2.0)

## 2022-09-05 SURGERY — RADIOLOGY WITH ANESTHESIA
Anesthesia: General

## 2022-09-05 MED ORDER — LIDOCAINE 2% (20 MG/ML) 5 ML SYRINGE
INTRAMUSCULAR | Status: DC | PRN
  Administered 2022-09-05: 60 mg via INTRAVENOUS

## 2022-09-05 MED ORDER — VERAPAMIL HCL 2.5 MG/ML IV SOLN
INTRA_ARTERIAL | Status: DC | PRN
  Administered 2022-09-05: 6 mL via INTRA_ARTERIAL

## 2022-09-05 MED ORDER — NITROGLYCERIN 1 MG/10 ML FOR IR/CATH LAB
INTRA_ARTERIAL | Status: AC
  Filled 2022-09-05: qty 10

## 2022-09-05 MED ORDER — LACTATED RINGERS IV SOLN: INTRAVENOUS | Status: DC | PRN

## 2022-09-05 MED ORDER — SENNOSIDES-DOCUSATE SODIUM 8.6-50 MG PO TABS: 1.0000 | ORAL_TABLET | Freq: Every evening | ORAL | Status: DC | PRN

## 2022-09-05 MED ORDER — ACETAMINOPHEN 325 MG PO TABS
650.0000 mg | ORAL_TABLET | ORAL | Status: DC | PRN
  Filled 2022-09-05: qty 2

## 2022-09-05 MED ORDER — ETOMIDATE 2 MG/ML IV SOLN
INTRAVENOUS | Status: DC | PRN
  Administered 2022-09-05: 16 mg via INTRAVENOUS

## 2022-09-05 MED ORDER — IOHEXOL 350 MG/ML SOLN
75.0000 mL | Freq: Once | INTRAVENOUS | Status: AC | PRN
  Administered 2022-09-05: 75 mL via INTRAVENOUS

## 2022-09-05 MED ORDER — SUCCINYLCHOLINE CHLORIDE 200 MG/10ML IV SOSY
PREFILLED_SYRINGE | INTRAVENOUS | Status: DC | PRN
  Administered 2022-09-05: 120 mg via INTRAVENOUS

## 2022-09-05 MED ORDER — FENTANYL CITRATE (PF) 250 MCG/5ML IJ SOLN
INTRAMUSCULAR | Status: DC | PRN
  Administered 2022-09-05: 100 ug via INTRAVENOUS

## 2022-09-05 MED ORDER — CLEVIDIPINE BUTYRATE 0.5 MG/ML IV EMUL
INTRAVENOUS | Status: AC
  Administered 2022-09-06: 20 mg/h via INTRAVENOUS
  Filled 2022-09-05: qty 50

## 2022-09-05 MED ORDER — CEFAZOLIN SODIUM-DEXTROSE 2-3 GM-%(50ML) IV SOLR
INTRAVENOUS | Status: DC | PRN
  Administered 2022-09-05: 2 g via INTRAVENOUS

## 2022-09-05 MED ORDER — ONDANSETRON HCL 4 MG/2ML IJ SOLN: 4.0000 mg | Freq: Once | INTRAMUSCULAR | Status: AC

## 2022-09-05 MED ORDER — ACETAMINOPHEN 160 MG/5ML PO SOLN
650.0000 mg | ORAL | Status: DC | PRN
  Administered 2022-09-07 – 2022-09-18 (×19): 650 mg
  Filled 2022-09-05 (×20): qty 20.3

## 2022-09-05 MED ORDER — CLEVIDIPINE BUTYRATE 0.5 MG/ML IV EMUL
INTRAVENOUS | Status: AC
  Administered 2022-09-06: 21 mg/h via INTRAVENOUS
  Filled 2022-09-05: qty 50

## 2022-09-05 MED ORDER — LORAZEPAM 2 MG/ML IJ SOLN: 1.0000 mg | Freq: Once | INTRAMUSCULAR | Status: AC

## 2022-09-05 MED ORDER — ONDANSETRON HCL 4 MG/2ML IJ SOLN
INTRAMUSCULAR | Status: AC
  Administered 2022-09-05: 4 mg via INTRAVENOUS
  Filled 2022-09-05: qty 2

## 2022-09-05 MED ORDER — VERAPAMIL HCL 2.5 MG/ML IV SOLN
INTRAVENOUS | Status: AC
  Filled 2022-09-05: qty 2

## 2022-09-05 MED ORDER — LACTATED RINGERS IV BOLUS: 1000.0000 mL | Freq: Once | INTRAVENOUS | Status: DC

## 2022-09-05 MED ORDER — ACETAMINOPHEN 650 MG RE SUPP: 650.0000 mg | RECTAL | Status: DC | PRN

## 2022-09-05 MED ORDER — HEPARIN SODIUM (PORCINE) 1000 UNIT/ML IJ SOLN
INTRAMUSCULAR | Status: AC
  Filled 2022-09-05: qty 10

## 2022-09-05 MED ORDER — FUROSEMIDE 10 MG/ML IJ SOLN: 80.0000 mg | Freq: Once | INTRAMUSCULAR | Status: DC

## 2022-09-05 MED ORDER — PHENYLEPHRINE 80 MCG/ML (10ML) SYRINGE FOR IV PUSH (FOR BLOOD PRESSURE SUPPORT)
PREFILLED_SYRINGE | INTRAVENOUS | Status: DC | PRN
  Administered 2022-09-05 – 2022-09-06 (×2): 80 ug via INTRAVENOUS

## 2022-09-05 MED ORDER — ROCURONIUM BROMIDE 10 MG/ML (PF) SYRINGE
PREFILLED_SYRINGE | INTRAVENOUS | Status: DC | PRN
  Administered 2022-09-05: 20 mg via INTRAVENOUS
  Administered 2022-09-05: 50 mg via INTRAVENOUS
  Administered 2022-09-05: 20 mg via INTRAVENOUS
  Administered 2022-09-05: 30 mg via INTRAVENOUS
  Administered 2022-09-06: 20 mg via INTRAVENOUS

## 2022-09-05 MED ORDER — FENTANYL CITRATE (PF) 100 MCG/2ML IJ SOLN
INTRAMUSCULAR | Status: AC
  Filled 2022-09-05: qty 2

## 2022-09-05 MED ORDER — CLEVIDIPINE BUTYRATE 0.5 MG/ML IV EMUL
INTRAVENOUS | Status: DC | PRN
  Administered 2022-09-05: 4 mg/h via INTRAVENOUS
  Administered 2022-09-05: 12 mg/h via INTRAVENOUS

## 2022-09-05 MED ORDER — SODIUM CHLORIDE 0.9 % IV SOLN: INTRAVENOUS | Status: AC

## 2022-09-05 MED ORDER — CEFAZOLIN SODIUM-DEXTROSE 2-4 GM/100ML-% IV SOLN
INTRAVENOUS | Status: AC
  Filled 2022-09-05: qty 100

## 2022-09-05 MED ORDER — LORAZEPAM 2 MG/ML IJ SOLN
INTRAMUSCULAR | Status: AC
  Administered 2022-09-05: 1 mg via INTRAVENOUS
  Filled 2022-09-05: qty 1

## 2022-09-05 MED ORDER — SODIUM CHLORIDE 0.9 % IV SOLN: 3.0000 g | Freq: Once | INTRAVENOUS | Status: DC

## 2022-09-05 MED ORDER — STROKE: EARLY STAGES OF RECOVERY BOOK
Freq: Once | Status: AC
  Filled 2022-09-05: qty 1

## 2022-09-05 MED ORDER — HEPARIN SODIUM (PORCINE) 1000 UNIT/ML IJ SOLN
INTRAMUSCULAR | Status: AC
  Filled 2022-09-05: qty 1

## 2022-09-05 MED ORDER — LACTATED RINGERS IV BOLUS
1000.0000 mL | Freq: Once | INTRAVENOUS | Status: AC
  Administered 2022-09-05: 1000 mL via INTRAVENOUS

## 2022-09-05 MED ORDER — SODIUM CHLORIDE (PF) 0.9 % IJ SOLN
INTRAVENOUS | Status: DC | PRN
  Administered 2022-09-05: 25 ug via INTRA_ARTERIAL
  Administered 2022-09-06: 200 ug via INTRA_ARTERIAL

## 2022-09-05 NOTE — ED Notes (Signed)
Consent obtained by sister, witnessed by this RN

## 2022-09-05 NOTE — ED Notes (Signed)
Date and time results received: 09/03/2022 4:25 PM  (use smartphrase ".now" to insert current time)  Test: lactic acid Critical Value: 2.7   Name of Provider Notified: Dr. Maryan Rued and Dr. Mammie Lorenzo  Orders Received? Or Actions Taken?: see chart

## 2022-09-05 NOTE — ED Provider Notes (Signed)
[facility] EMERGENCY DEPARTMENT Provider Note   CSN: [id number] Arrival date & time: 08/30/2022  1501     History  Chief Complaint  Patient presents with   Vincent [name] is a 87 y.o. male.  Past medical history of CHF with an EF 25 to 30% with global hypokinesis, permanent A-fib on Eliquis and digoxin, CAD, type 2 diabetes, CVA, chronic bilateral lower extremity wounds and edema.  Brought to the emergency department today by EMS as daughter was at home with him and found him down.  Unknown downtime.  Daughter left the house today, when she returned he was on the ground.  Patient notes that he does not know why he fell he cannot remember why he fell but has had multiple episodes of vomiting today.  Denies any abdominal pain, chest pain, difficulty breathing.  Given history of diabetes, EMS checked blood sugar which was in the 200s.  With EMS, had borderline bradycardia, normal oxygen saturation, normal blood pressure.  HPI     Home Medications Prior to Admission medications   Medication Sig Start Date End Date Taking? Authorizing Provider  acetaminophen (TYLENOL) 500 MG tablet Take 2 tablets (1,000 mg total) by mouth every 6 (six) hours as needed. Patient taking differently: Take 1,000 mg by mouth every 6 (six) hours as needed for moderate pain or mild pain. 02/21/21   [name], MD  apixaban (ELIQUIS) 5 MG TABS tablet Take 1 tablet (5 mg total) by mouth 2 (two) times daily. 05/16/22   Larey Dresser, MD  atorvastatin (LIPITOR) 80 MG tablet Take 1 tablet (80 mg total) by mouth daily. 06/29/22   Milford, Maricela Bo, FNP  Brimonidine Tartrate (ALPHAGAN P OP) Place 1 drop into both eyes in the morning and at bedtime.    [provider]  Calcium Carbonate-Vitamin D (OSCAL 500 D-3 PO) Take 1 tablet by mouth 2 (two) times daily.    [provider]  carvedilol (COREG) 6.25 MG tablet Take 1 tablet (6.25 mg total) by mouth 2 (two) times daily with a  meal. 12/07/21   Larey Dresser, MD  Cyanocobalamin (VITAMIN B-12 CR PO) Take 1 tablet by mouth daily.    [provider]  digoxin (LANOXIN) 0.125 MG tablet Take 1 tablet (0.125 mg total) by mouth daily. 06/29/22   Milford, Maricela Bo, FNP  empagliflozin (JARDIANCE) 25 MG TABS tablet Take 25 mg by mouth daily.    [provider]  famotidine (PEPCID) 20 MG tablet Take 20 mg by mouth daily.    [provider]  Ferrous Sulfate (IRON PO) Take 1 tablet by mouth daily.    [provider]  furosemide (LASIX) 40 MG tablet Take 40 mg by mouth daily.    [provider]  isosorbide mononitrate (IMDUR) 30 MG 24 hr tablet Take 1 tablet (30 mg total) by mouth daily. 12/17/21   Larey Dresser, MD  loratadine (CLARITIN) 10 MG tablet Take 10 mg by mouth daily.    [provider]  LUMIGAN 0.01 % SOLN Place 1 drop into both eyes at bedtime. 06/05/22   [provider]  Magnesium 400 MG TABS Take 400 mg by mouth 2 (two) times daily.    [provider]  metFORMIN (GLUCOPHAGE) 500 MG tablet Take 500 mg by mouth 2 (two) times daily. 06/16/19   [provider]  nitroGLYCERIN (NITROLINGUAL) 0.4 MG/SPRAY spray Place 1 spray under the tongue every 5 (five) minutes x  3 doses as needed for chest pain. 06/29/22   Milford, Maricela Bo, FNP  Omega-3 Fatty Acids (OMEGA-3 FISH OIL PO) Take 1 capsule by mouth daily.    [provider]  oxyCODONE (OXY IR/ROXICODONE) 5 MG immediate release tablet Take 1 tablet (5 mg total) by mouth every 6 (six) hours as needed for severe pain. 07/27/22   Arrien, Jimmy Picket, MD  sacubitril-valsartan (ENTRESTO) 97-103 MG Take 1 tablet by mouth 2 (two) times daily. 12/07/21   Larey Dresser, MD  spironolactone (ALDACTONE) 25 MG tablet Take 25 mg by mouth daily.    [provider]      Allergies    Patient has no known allergies.    Review of Systems   Review of Systems  Physical Exam Updated Vital  Signs BP (!) 162/81   Pulse 61   Temp (!) 97.5 F (36.4 C) (Oral)   Resp 18   SpO2 100%  Physical Exam Vitals and nursing note reviewed.  Constitutional:      Appearance: He is well-developed. He is ill-appearing and diaphoretic.  HENT:     Head: Normocephalic and atraumatic.  Eyes:     Conjunctiva/sclera: Conjunctivae normal.  Cardiovascular:     Rate and Rhythm: Bradycardia present. Rhythm irregular.     Pulses: Normal pulses.     Heart sounds: Normal heart sounds. No murmur heard. Pulmonary:     Effort: Pulmonary effort is normal. No respiratory distress.     Breath sounds: Normal breath sounds. No stridor. No wheezing, rhonchi or rales.  Abdominal:     General: Abdomen is flat.     Palpations: Abdomen is soft.     Tenderness: There is no abdominal tenderness. There is no guarding or rebound.  Musculoskeletal:        General: No swelling.     Cervical back: Neck supple.     Right lower leg: Edema present.     Left lower leg: Edema present.     Comments: Chronic venous stasis in bilateral lower extremities with healthy appearing wounds of the right lower extremity without purulent drainage  Skin:    General: Skin is warm.     Capillary Refill: Capillary refill takes less than 2 seconds.  Neurological:     General: No focal deficit present.     Mental Status: He is alert.     GCS: GCS eye subscore is 3. GCS verbal subscore is 4. GCS motor subscore is 6.  Psychiatric:        Mood and Affect: Mood normal.     ED Results / Procedures / Treatments   Labs (all labs ordered are listed, but only abnormal results are displayed) Labs Reviewed  CBC WITH DIFFERENTIAL/PLATELET - Abnormal; Notable for the following components:      Result Value   WBC 3.8 (*)    RBC 4.16 (*)    Hemoglobin 11.1 (*)    HCT 36.7 (*)    RDW 18.4 (*)    Platelets 149 (*)    Lymphs Abs 0.5 (*)    All other components within normal limits  LACTIC ACID, PLASMA - Abnormal; Notable for the  following components:   Lactic Acid, Venous 2.7 (*)    All other components within normal limits  BRAIN NATRIURETIC PEPTIDE - Abnormal; Notable for the following components:   B Natriuretic Peptide 3,009.2 (*)    All other components within normal limits  COMPREHENSIVE METABOLIC PANEL - Abnormal; Notable for the following components:  Potassium 3.3 (*)    Glucose, Bld 152 (*)    Calcium 8.8 (*)    Albumin 3.3 (*)    AST 64 (*)    ALT 48 (*)    Alkaline Phosphatase 144 (*)    Total Bilirubin 1.8 (*)    All other components within normal limits  DIGOXIN LEVEL - Abnormal; Notable for the following components:   Digoxin Level <0.2 (*)    All other components within normal limits  I-STAT CHEM 8, ED - Abnormal; Notable for the following components:   Glucose, Bld 153 (*)    Hemoglobin 12.2 (*)    HCT 36.0 (*)    All other components within normal limits  I-STAT VENOUS BLOOD GAS, ED - Abnormal; Notable for the following components:   pO2, Ven 18 (*)    Bicarbonate 29.5 (*)    Acid-Base Excess 3.0 (*)    HCT 36.0 (*)    Hemoglobin 12.2 (*)    All other components within normal limits  TROPONIN I (HIGH SENSITIVITY) - Abnormal; Notable for the following components:   Troponin I (High Sensitivity) 60 (*)    All other components within normal limits  TROPONIN I (HIGH SENSITIVITY) - Abnormal; Notable for the following components:   Troponin I (High Sensitivity) 58 (*)    All other components within normal limits  RESP PANEL BY RT-PCR (RSV, FLU A&B, COVID)  [id number]  LACTIC ACID, PLASMA  CK    EKG EKG Interpretation  Date/Time:  Monday September 05 2022 15:14:15 EST Ventricular Rate:  50 PR Interval:    QRS Duration: 140 QT Interval:  545 QTC Calculation: 461 R Axis:   9 Text Interpretation: Atrial fibrillation Left bundle branch block Baseline wander in lead(s) V1 similar to EKG on 8/23 Confirmed by Blanchie Dessert ([id number]) on 08/31/2022 3:58:45 PM  Radiology CT Angio Head W or  Wo Contrast  Result Date: 09/10/2022 CLINICAL DATA:  Dizziness and vomiting. Right-sided gaze preference. Stroke workup. EXAM: CT ANGIOGRAPHY HEAD TECHNIQUE: Multidetector CT imaging of the head was performed using the standard protocol during bolus administration of intravenous contrast. Multiplanar CT image reconstructions and MIPs were obtained to evaluate the vascular anatomy. RADIATION DOSE REDUCTION: This exam was performed according to the departmental dose-optimization program which includes automated exposure control, adjustment of the mA and/or kV according to patient size and/or use of iterative reconstruction technique. CONTRAST:  29m OMNIPAQUE IOHEXOL 350 MG/ML SOLN COMPARISON:  Same-day noncontrast CT head. FINDINGS: CTA HEAD Anterior circulation: The imaged portions of the ICAs in the neck are patent. There is calcified plaque in the carotid siphons resulting in moderate to severe stenosis bilaterally. The right M1 segment is patent. There is multifocal high-grade stenosis of the right M2 and M3 branches corresponding to the chronic MCA distribution infarct (7-96, 7-105, 7-117). The ACAs are patent, without proximal high-grade stenosis or occlusion. The anterior communicating artery is normal. There is no aneurysm or AVM. The left M1 segment is patent. The distal left MCA branches appear overall patent, without proximal high-grade stenosis or occlusion. Posterior circulation: The left V4 segment is occluded there is calcified plaque in the proximal V4 segment resulting in moderate to severe stenosis. There is poor opacification of the basilar artery throughout with the apparent occlusion distally. The bilateral PCAs are supplied by posterior communicating arteries with likely retrograde flow into the P1 segments. There is no proximal high-grade stenosis in the PCAs. There is no aneurysm or AVM. Venous sinuses: Suboptimally evaluated due to  bolus timing. Anatomic variants: As above. Review of the MIP  images confirms the above findings. IMPRESSION: 1. 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 The bilateral PCAs are supplied by posterior communicating arteries with likely retrograde flow into the P1 segments. 2. Multifocal high-grade stenosis of the right M2 and M3 branches relatively diminished perfusion corresponding to the chronic MCA distribution infarct. 3. Moderate to severe stenosis in the bilateral carotid siphons. These results were called by telephone at the time of interpretation on 09/04/2022 at 7:40 pm to provider Dr Cheral Marker, who verbally acknowledged these results. Electronically Signed   By: Valetta Mole M.D.   On: 09/02/2022 19:41   US Abdomen Limited RUQ (LIVER/GB)  Result Date: 09/27/2022 CLINICAL DATA:  Vomiting EXAM: ULTRASOUND ABDOMEN LIMITED RIGHT UPPER QUADRANT COMPARISON:  CT 09/03/2022 earlier.  Older exams as well. FINDINGS: Gallbladder: Gallbladder is mildly distended with dependent stones. No wall thickening. There is some trace ascites. Common bile duct: Diameter: 3 mm Liver: No focal lesion identified. Within normal limits in parenchymal echogenicity. Portal vein is patent on color Doppler imaging with normal direction of blood flow towards the liver. Other: Trace free fluid. IMPRESSION: Distended gallbladder with stones. No ductal dilatation. No further sonographic evidence of acute cholecystitis at this time. Electronically Signed   By: Jill Side M.D.   On: 09/16/2022 18:30   CT Head Wo Contrast  Result Date: 09/14/2022 CLINICAL DATA:  Neck trauma (Age >= 65y); Head trauma, minor (Age >= 65y) EXAM: CT HEAD WITHOUT CONTRAST CT CERVICAL SPINE WITHOUT CONTRAST TECHNIQUE: Multidetector CT imaging of the head and cervical spine was performed following the standard protocol without intravenous contrast. Multiplanar CT image reconstructions of the cervical spine were also generated. RADIATION  DOSE REDUCTION: This exam was performed according to the departmental dose-optimization program which includes automated exposure control, adjustment of the mA and/or kV according to patient size and/or use of iterative reconstruction technique. COMPARISON:  CT head and C-spine 07/20/2022 FINDINGS: CT HEAD FINDINGS Brain: Cerebral ventricle sizes are concordant with the degree of cerebral volume loss. 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 Chronic bilateral cerebellar infarctions. Patchy and confluent areas of decreased attenuation are noted throughout the deep and periventricular white matter of the cerebral hemispheres bilaterally, compatible with chronic microvascular ischemic disease. No evidence of large-territorial acute infarction. No parenchymal hemorrhage. No mass lesion. No extra-axial collection. No mass effect or midline shift. No hydrocephalus. Basilar cisterns are patent. Vascular: No hyperdense vessel. Skull: No acute fracture or focal lesion. Sinuses/Orbits: Paranasal sinuses and mastoid air cells are clear. Bilateral lens replacement. Otherwise the orbits are unremarkable. Other: None. CT CERVICAL SPINE FINDINGS Alignment: Normal. Skull base and vertebrae: Multilevel moderate severe degenerative changes of the spine with associated posterior disc osteophyte complex formation at the C3-C4 level and C4-C5 level. Possible smaller posterior disc osteophyte complex formation at the C6-C7 level. No associated severe osseous neural foraminal or central canal stenosis. No acute fracture. No aggressive appearing focal osseous lesion or focal pathologic process. Soft tissues and spinal canal: No prevertebral fluid or swelling. No visible canal hematoma. Upper chest: Unremarkable. Other: None. IMPRESSION: 1. No acute intracranial abnormality. 2. No acute displaced fracture or traumatic listhesis of the cervical spine. Electronically Signed   By: Iven Finn M.D.   On: 09/21/2022 17:32   CT Cervical Spine Wo Contrast  Result Date: 09/06/2022 CLINICAL DATA:  Neck trauma (Age >=  65y); Head trauma, minor (Age >= 65y) EXAM: CT HEAD WITHOUT CONTRAST CT CERVICAL SPINE WITHOUT CONTRAST TECHNIQUE: Multidetector CT imaging of the head and cervical spine was performed following the standard protocol without intravenous contrast. Multiplanar CT image reconstructions of the cervical spine were also generated. RADIATION DOSE REDUCTION: This exam was performed according to the departmental dose-optimization program which includes automated exposure control, adjustment of the mA and/or kV according to patient size and/or use of iterative reconstruction technique. COMPARISON:  CT head and C-spine 07/20/2022 FINDINGS: CT HEAD FINDINGS Brain: Cerebral ventricle sizes are concordant with the degree of cerebral volume loss. 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 Chronic bilateral cerebellar infarctions. Patchy and confluent areas of decreased attenuation are noted throughout the deep and periventricular white matter of the cerebral hemispheres bilaterally, compatible with chronic microvascular ischemic disease. No evidence of large-territorial acute infarction. No parenchymal hemorrhage. No mass lesion. No extra-axial collection. No mass effect or midline shift. No hydrocephalus. Basilar cisterns are patent. Vascular: No hyperdense vessel. Skull: No acute fracture or focal lesion. Sinuses/Orbits: Paranasal sinuses and mastoid air cells are clear. Bilateral lens replacement. Otherwise the orbits are unremarkable. Other: None. CT CERVICAL SPINE FINDINGS Alignment: Normal. Skull base and vertebrae: Multilevel moderate severe degenerative changes of the spine with associated posterior disc osteophyte complex formation at the C3-C4 level and C4-C5 level. Possible smaller posterior disc osteophyte complex formation at the C6-C7  level. No associated severe osseous neural foraminal or central canal stenosis. No acute fracture. No aggressive appearing focal osseous lesion or focal pathologic process. Soft tissues and spinal canal: No prevertebral fluid or swelling. No visible canal hematoma. Upper chest: Unremarkable. Other: None. IMPRESSION: 1. No acute intracranial abnormality. 2. No acute displaced fracture or traumatic listhesis of the cervical spine. Electronically Signed   By: Iven Finn M.D.   On: 09/14/2022 17:32   CT CHEST ABDOMEN PELVIS W CONTRAST  Result Date: 09/24/2022 CLINICAL DATA:  Sepsis.  Fall.  Unknown time down. EXAM: CT CHEST, ABDOMEN, AND PELVIS WITH CONTRAST TECHNIQUE: Multidetector CT imaging of the chest, abdomen and pelvis was performed following the standard protocol during bolus administration of intravenous contrast. RADIATION DOSE REDUCTION: This exam was performed according to the departmental dose-optimization program which includes automated exposure control, adjustment of the mA and/or kV according to patient size and/or use of iterative reconstruction technique. CONTRAST:  46m OMNIPAQUE IOHEXOL 350 MG/ML SOLN COMPARISON:  X-ray thoracic and lumbar spine 03/31/2020 FINDINGS: CHEST: Cardiovascular: No aortic injury. The thoracic aorta is normal in caliber. The heart is enlarged in size. No significant pericardial effusion. Atherosclerotic plaque. Four-vessel coronary calcification. Mediastinum/Nodes: No pneumomediastinum. No mediastinal hematoma. The esophagus is unremarkable. The thyroid is unremarkable. The central airways are patent. No mediastinal, hilar, or axillary lymphadenopathy. Lungs/Pleura: Peribronchovascular ground-glass airspace opacities of the lingula and bilateral lower lobes. No pulmonary nodule. No pulmonary mass. No pulmonary contusion or laceration. No pneumatocele formation. Trace right pleural effusion. Possible tiny trace left pleural effusion. No pneumothorax. No hemothorax.  Musculoskeletal/Chest wall: No chest wall mass. No acute rib or sternal fracture. Old healed sternal fracture. Old healed left rib fractures. No spinal fracture. Mild to moderate degenerative changes of the shoulders, right greater than left. ABDOMEN / PELVIS: Hepatobiliary: Not enlarged. Heterogeneous appearance of the hepatic parenchyma likely due to streak artifact originating from the bilateral upper extremities along the patient's sides. No focal lesion. No laceration or subcapsular hematoma. Calcified gallstones within the gallbladder lumen. Suggestion of fundal gallbladder wall thickening/pericholecystic  fluid. No biliary ductal dilatation. Pancreas: Normal pancreatic contour. No main pancreatic duct dilatation. Spleen: Not enlarged. No focal lesion. No laceration, subcapsular hematoma, or vascular injury. Adrenals/Urinary Tract: No nodularity bilaterally. Bilateral kidneys enhance symmetrically. No hydronephrosis. No contusion, laceration, or subcapsular hematoma. Fluid dense lesion of the right kidney likely represents a simple renal cyst. Simple renal cysts, in the absence of clinically indicated signs/symptoms, require no independent follow-up. No injury to the vascular structures or collecting systems. No hydroureter. The urinary bladder is unremarkable. On delayed imaging, there is no urothelial wall thickening and there are no filling defects in the opacified portions of the bilateral collecting systems or ureters. Stomach/Bowel: No small or large bowel wall thickening or dilatation. Colonic diverticulosis. The appendix is unremarkable. Vasculature/Lymphatics: Severe atherosclerotic plaque. No abdominal aorta or iliac aneurysm. No active contrast extravasation or pseudoaneurysm. No abdominal, pelvic, inguinal lymphadenopathy. Reproductive: Prostate is borderline enlarged in size. Partially visualized at least small to moderate volume right hydrocele. Other: No simple free fluid ascites. No  pneumoperitoneum. No hemoperitoneum. No mesenteric hematoma identified. No organized fluid collection. Musculoskeletal: No significant soft tissue hematoma. No acute pelvic fracture. No acute spinal fracture. Chronic compression fracture of the L1 and L4 vertebral bodies grossly stable in appearance. Grade 1 anterolisthesis of L3 on L4 and L4 on L5. Multilevel degenerative changes of the spine. Ports and Devices: None. IMPRESSION: 1. Peribronchovascular ground-glass airspace opacities of the lingula and bilateral lower lobes. Findings suggestive of infection/inflammation and consistent with chest x-ray. Followup PA and lateral chest X-ray is recommended in 3-4 weeks following therapy to ensure resolution. 2. Trace right pleural effusion. Possible tiny trace left pleural effusion. 3. Cholelithiasis with suggestion of fundal gallbladder wall thickening/pericholecystic fluid. Correlate with liver function tests. Consider right upper quadrant ultrasound for a more sensitive evaluation of the gallbladder if clinically indicated. 4. No acute traumatic injury to the chest, abdomen, or pelvis. 5. No acute fracture or traumatic malalignment of the thoracic or lumbar spine. 6. Other imaging findings of potential clinical significance: Colonic diverticulosis with no acute diverticulitis. Cardiomegaly. Borderline prostatomegaly. Partially visualized at least small to moderate volume right hydrocele .Aortic Atherosclerosis (ICD10-I70.0) -including four-vessel coronary artery calcification. Electronically Signed   By: Iven Finn M.D.   On: 09/02/2022 17:26   DG Chest Port 1 View  Result Date: 08/30/2022 CLINICAL DATA:  Shortness of breath and weakness. EXAM: PORTABLE CHEST 1 VIEW COMPARISON:  07/20/2022 FINDINGS: Patient is slightly rotated to the right. Lungs are adequately inflated demonstrate subtle hazy prominence of the pulmonary vasculature as well as minimal bibasilar density. Findings likely due to mild  interstitial edema/vascular congestion. Not exclude infection in the lung bases. Stable cardiomegaly. Metallic device projects over the anterior left chest wall unchanged. Remainder of the exam is unchanged. IMPRESSION: 1. Findings suggesting mild interstitial edema/vascular congestion. Infection in the lung bases is possible. 2. Stable cardiomegaly. Electronically Signed   By: Marin Olp M.D.   On: 09/08/2022 15:35    Procedures Procedures    Medications Ordered in ED Medications  furosemide (LASIX) injection 80 mg (has no administration in time range)  clevidipine (CLEVIPREX) 0.5 MG/ML infusion (has no administration in time range)  ceFAZolin (ANCEF) 2-4 GM/100ML-% IVPB (has no administration in time range)  nitroGLYCERIN 100 mcg/mL intra-arterial injection (has no administration in time range)  Ampicillin-Sulbactam (UNASYN) 3 g in sodium chloride 0.9 % 100 mL IVPB (has no administration in time range)  lactated ringers bolus 1,000 mL (0 mLs Intravenous Stopped 09/26/2022 1711)  ondansetron (ZOFRAN) injection 4 mg (4 mg Intravenous Given 09/20/2022 1519)  iohexol (OMNIPAQUE) 350 MG/ML injection 75 mL (75 mLs Intravenous Contrast Given 09/23/2022 1659)  iohexol (OMNIPAQUE) 350 MG/ML injection 75 mL (75 mLs Intravenous Contrast Given 09/22/2022 1839)  fentaNYL (SUBLIMAZE) 100 MCG/2ML injection (  Override pull for Anesthesia 09/13/2022 2105)  LORazepam (ATIVAN) injection 1 mg (1 mg Intravenous Given 09/18/2022 2051)    ED Course/ Medical Decision Making/ A&P                           Medical Decision Making Problems Addressed: Aspiration pneumonia of both lower lobes due to vomit [facility]): acute illness or injury that poses a threat to life or bodily functions Basilar artery occlusion: acute illness or injury that poses a threat to life or bodily functions Dizziness: acute illness or injury that poses a threat to life or bodily functions Fall, initial encounter: acute illness or injury that poses a threat to  life or bodily functions Nausea and vomiting, unspecified vomiting type: acute illness or injury that poses a threat to life or bodily functions  Amount and/or Complexity of Data Reviewed Labs: ordered. Decision-making details documented in ED Course. Radiology: ordered and independent interpretation performed. Decision-making details documented in ED Course. ECG/medicine tests: ordered and independent interpretation performed. Decision-making details documented in ED Course.  Risk Prescription drug management. Decision regarding hospitalization.   This is a 87 y.o. male who presents to the emergency department with fall,nausea, vomiting. History is obtained from EMS and patient. Medical history reviewed in EMR.  Past medical/surgical history that increases complexity of ED encounter:  CHF with an EF 25 to 30% with global hypokinesis, permanent A-fib on Eliquis and digoxin, CAD, type 2 diabetes, CVA, chronic bilateral lower extremity wounds and edema  Initial Assessment:  Patient overall ill-appearing on arrival actively vomiting, unable to fully answer questions and take history.  Has taken all of his medications, has not missed any doses.  Did have a fall at home, is on Eliquis.  Also taking digoxin for his atrial fibrillation and HFrEF.  Initial vitals significant for bradycardia in the 40s with normal blood pressure, afebrile, no respiratory distress.  Initial EKG obtained on arrival shows significant T wave inversions primarily in the inferior and lateral leads which were not present compared to most recent, however after further investigation were present compared to EKGs from the past.  My attending spoke with cardiology who did not at that time think he was a STEMI candidate.  Differential for his presentation is multifactorial, could be related to complication from his fall, DKA, worsening HFrEF, ACS/MI, intra-abdominal pathology  Initial Plan: Laboratory evaluation with CBC, CMP,  troponin x 2, CK, VBG, i-STAT Chem-8, COVID/flu, BNP, lactic acid, digoxin level ECG Chest x-ray CT scans of the head, cervical spine, and chest/abdomen/pelvis  Interpretation of Diagnostics: I personally reviewed the EKG, labs, x-ray, and CT scans and my interpretation is as follows: VBG relatively unremarkable.  CBC shows white blood cell count of 3.8, stable hemoglobin.  Initially lactic acid is elevated 2.7 but responds well to fluids and returns back down to normal.  Initial troponin is 60, repeat is 58.  Digoxin levels are actually low.  Rest of labs relatively unremarkable.  Chest x-ray with some possible signs of aspiration, and does show cardiomegaly and volume overload.  Will order antibiotics as he is now requiring 3 L of nasal cannula to maintain sats in the low to  mid 32s.  CT scans show no acute traumatic injuries.  It does show some gallstones and trace pericholecystic fluid as well as possibly dilated gallbladder wall.  An ultrasound of the right upper quadrant was obtained which shows no signs of acute cholecystitis but with gallstones.  Patient still altered on exam with leftward nystagmus and right gaze preference.  Talk to neurology on the phone who recommended a stat CTA of the head and neck.  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  Neurology came down at bedside to evaluate him, sister is now saying that his last known normal was at 1 PM, initial last known normal was much less determined and initially had said that he had a fall yesterday and his last known normal may have been yesterday.  Now patient and sister are declining that there was a fall yesterday.  Dr. Cheral Marker called a code stroke on the patient, discussed with interventional radiology, they have plans to take him for intervention with angiography and potential thrombectomy.  MDM generated using voice dictation software and may contain dictation errors. Please contact me for any  clarification or with any questions.          Final Clinical Impression(s) / ED Diagnoses Final diagnoses:  Fall, initial encounter  Dizziness  Nausea and vomiting, unspecified vomiting type  Basilar artery occlusion  Aspiration pneumonia of both lower lobes due to vomit [facility])    Rx / DC Orders ED Discharge Orders     None         Phyllis Ginger, MD 09/07/2022 2112    Blanchie Dessert, MD 09/06/22 2318

## 2022-09-05 NOTE — Anesthesia Procedure Notes (Signed)
Arterial Line Insertion Start/End01/22/2024 9:00 PM, 09/28/2022 9:05 PM Performed by: Dorthea Cove, CRNA, CRNA  Patient location: OOR procedure area. Preanesthetic checklist: patient identified, IV checked, site marked, risks and benefits discussed, surgical consent, monitors and equipment checked, pre-op evaluation, timeout performed and anesthesia consent Patient sedated Left, radial was placed Catheter size: 20 G Hand hygiene performed  and maximum sterile barriers used   Attempts: 1 Procedure performed without using ultrasound guided technique. Following insertion, dressing applied and Biopatch. Post procedure assessment: normal  Patient tolerated the procedure well with no immediate complications.

## 2022-09-05 NOTE — Anesthesia Preprocedure Evaluation (Addendum)
Anesthesia Evaluation  Patient identified by MRN, date of birth, ID band  Reviewed: Allergy & Precautions, Patient's Chart, lab work & pertinent test resultsPreop documentation limited or incomplete due to emergent nature of procedure.  Airway Mallampati: II  TM Distance: >3 FB Neck ROM: Full    Dental no notable dental hx.    Pulmonary COPD, former smoker   Pulmonary exam normal        Cardiovascular hypertension, Pt. on medications and Pt. on home beta blockers + CAD, + Past MI and +CHF   Rhythm:Regular Rate:Normal  ECHO 08/23:  1. Left ventricular ejection fraction, by estimation, is 25 to 30%. The  left ventricle has severely decreased function. The left ventricle  demonstrates global hypokinesis. The left ventricular internal cavity size  was moderately to severely dilated.  Left ventricular diastolic function could not be evaluated.   2. Right ventricular systolic function is mildly reduced. The right  ventricular size is mildly enlarged. There is mildly elevated pulmonary  artery systolic pressure. The estimated right ventricular systolic  pressure is 29.7 mmHg.   3. Left atrial size was moderately dilated.   4. Right atrial size was moderately dilated.   5. The mitral valve is normal in structure. Mild to moderate mitral valve  regurgitation.   6. The aortic valve is tricuspid. Aortic valve regurgitation is not  visualized. No aortic stenosis is present.   7. The inferior vena cava is normal in size with greater than 50%  respiratory variability, suggesting right atrial pressure of 3 mmHg.   Comparison(s): There may be a slight improvement in left ventricular  contractility compared to 09/18/2021.      Neuro/Psych CODE STROKE CVA  negative psych ROS   GI/Hepatic Neg liver ROS,GERD  ,,  Endo/Other  diabetes, Type 2, Oral Hypoglycemic Agents    Renal/GU negative Renal ROS  negative genitourinary    Musculoskeletal negative musculoskeletal ROS (+)    Abdominal Normal abdominal exam  (+)   Peds  Hematology  (+) Blood dyscrasia, anemia Lab Results      Component                Value               Date                      WBC                      3.8 (L)             09/08/2022                HGB                      12.2 (L)            09/04/2022                HGB                      12.2 (L)            09/02/2022                HCT                      36.0 (L)            09/08/2022  HCT                      36.0 (L)            09/10/2022                MCV                      88.2                09/13/2022                PLT                      149 (L)             09/10/2022             Lab Results      Component                Value               Date                      NA                       144                 09/01/2022                NA                       143                 09/22/2022                K                        3.7                 09/04/2022                K                        3.7                 08/30/2022                CO2                      26                  09/24/2022                GLUCOSE                  153 (H)             09/18/2022                BUN                      8                   09/13/2022  CREATININE               1.00                09/18/2022                CALCIUM                  8.8 (L)             09/06/2022                GFRNONAA                 >60                 09/18/2022              Anesthesia Other Findings   Reproductive/Obstetrics                             Anesthesia Physical Anesthesia Plan  ASA: 4 and emergent  Anesthesia Plan: General   Post-op Pain Management:    Induction: Intravenous and Rapid sequence  PONV Risk Score and Plan: 2 and Ondansetron, Dexamethasone and Treatment may vary due to age or medical condition  Airway  Management Planned: Mask and Oral ETT  Additional Equipment: Arterial line  Intra-op Plan:   Post-operative Plan: Possible Post-op intubation/ventilation  Informed Consent:      Only emergency history available  Plan Discussed with:   Anesthesia Plan Comments:        Anesthesia Quick Evaluation

## 2022-09-05 NOTE — ED Triage Notes (Signed)
Pt BIB [facility] from home with reports of fall and unknown time down. Per EMS family on scene reports they left the house today and when they returned he was found on the ground. Family unsure of how long they were gone.

## 2022-09-05 NOTE — ED Provider Notes (Incomplete)
Patient is an elderly male with multiple medical problems presenting today with complaints of dizziness, vomiting that started sometime this morning or yesterday.  Patient's sister called EMS because she found him on the floor.  He reports being nauseated but cannot answer questions.  He reports compliance with his medication but is on anticoagulant, Eliquis, also digoxin for atrial fibrillation and has a history of reduced EF of 25 to 30%.  Patient EKG initially showed significant T wave inversion and ST depression.  Discussed with cardiologist Dr. Saunders Revel who at that time did not feel that he was a STEMI candidate.  Does not appear to have significant heart block requiring pacemaker at this time.  Patient was given Zofran for nausea with improvement in the vomiting.  He does have signs of fluid overload however unclear chronicity.  Viral panel is negative today, creatinine is at baseline with normal electrolytes and VBG with normal pH, mild leukopenia with a white count of 3.8 but stable hemoglobin of 11.  Lactic acid is mildly elevated 2.7 and troponin is 60 digoxin levels are still pending.  Chest x-ray with findings consistent with cardiomegaly and mild overload. 8:03 PM Digoxin levels were undetectable.  Patient's labs relatively appear to be at baseline.  However on exam patient is still altered.  With holding his eyes open he has some nystagmus and has a hard time crossing over to the left.  Still unclear last seen normal.  Consulted with neurology who recommended a CTA of his head and neck which was done which does show a basilar occlusion.  Dr. Cheral Marker with the neurology team came to evaluate the patient and his sister is reporting now that he was last normal at 1 PM before she left the house.  Before patient reported that he had fallen this morning and then at 1 time had said he had fallen yesterday.  His sister is currently staying with him and reports he has not fallen to her knowledge.  Dr. Cheral Marker would  like to call a code stroke on the patient.  He does take Eliquis and was discussing with interventional radiology.  Patient is still able to converse but is more somnolent now than prior and has not received any medications that would cause this.  Also concerned that patient has had aspiration based on chest x-ray findings and is requiring 3 L nasal cannula to maintain sats of 92%.  BNP and troponin are elevated but appear to be baseline.  CRITICAL CARE Performed by: Eldwin Volkov Total critical care time: 30 minutes Critical care time was exclusive of separately billable procedures and treating other patients. Critical care was necessary to treat or prevent imminent or life-threatening deterioration. Critical care was time spent personally by me on the following activities: development of treatment plan with patient and/or surrogate as well as nursing, discussions with consultants, evaluation of patient's response to treatment, examination of patient, obtaining history from patient or surrogate, ordering and performing treatments and interventions, ordering and review of laboratory studies, ordering and review of radiographic studies, pulse oximetry and re-evaluation of patient's condition.

## 2022-09-05 NOTE — Anesthesia Procedure Notes (Signed)
Procedure Name: Intubation Date/Time: 09/26/2022 9:03 PM  Performed by: Amadeo Garnet, CRNAPre-anesthesia Checklist: Patient identified, Emergency Drugs available, Suction available and Patient being monitored Patient Re-evaluated:Patient Re-evaluated prior to induction Oxygen Delivery Method: Circle system utilized Preoxygenation: Pre-oxygenation with 100% oxygen Induction Type: IV induction and Rapid sequence Laryngoscope Size: Mac and 4 Grade View: Grade I Tube type: Oral Tube size: 7.5 mm Number of attempts: 1 Airway Equipment and Method: Stylet and Oral airway Placement Confirmation: ETT inserted through vocal cords under direct vision, positive ETCO2 and breath sounds checked- equal and bilateral Secured at: 23 cm Tube secured with: Tape Dental Injury: Teeth and Oropharynx as per pre-operative assessment

## 2022-09-05 NOTE — ED Notes (Signed)
PT pulling at monitoring cables and gown. Pt placed in bilateral mitts. Explained to family the rationale.

## 2022-09-05 NOTE — H&P (Incomplete)
NEUROHOSPITALIST ADMISSION HISTORY AND PHYSICAL   Requestig physician: Dr. Maryan Rued  Reason for Consult: Nausea and vomiting after a fall at home  History obtained from:  Sister and Chart     HPI:                                                                                                                                          KAWAN VALLADOLID is an 87 y.o. male with a PMHx of alcoholic cardiomyopathy, permanent atrial fibrillation on Eliquis, CHF with an EF 25 to 30% with global hypokinesis, CAD s/p stenting, DM2, prior stroke, chronic bilateral lower extremity wounds with edema, LLE peroneal neuropathy with chronic foot drop, COPD, HLD and HTN who presented to the ED via EMS this afternoon after he was found down by his sister with unknown downtime per initial history taking by ED team. Sister left the house today and when she returned he was on the ground. The patient was conversant after arriving to the ED. He stated that he did not know why he fell, but did recall having had multiple episodes of vomiting with dizziness today. Blood sugar per EMS was in the 200s; he had borderline bradycardia, normal oxygen saturation, and normal blood pressure per EMS. His EKG initially showed significant T wave inversion and ST depression. ED team had discussed with cardiologist Dr. Saunders Revel who at that time did not feel that he was a STEMI candidate. The patient did not appear to have significant heart block requiring pacemaker, per EDP note. The patient was given Zofran with improvement in his vomiting. He did have signs of fluid overload of unclear chronicity. Viral panel was negative  Neurology had been called and recommended a CTA of head to further assess. CTA was discussed in sign out, with report still pending but with possible basilar artery occlusion. Patient was seen immediately at 7:20 PM after reviewing CTA. Radiology called to provide preliminary read of CTA, confirming distal  basilar artery occlusion, left V4 occlusion and tenuous flow throughout the vertebrobasilar artery territory with the exception of the posterior cerebral arteries, which arise from the anterior circulation.   Sister was present at the bedside and clarified to examiner that the patient was last seen by her at his baseline at 1:00 PM today prior to her leaving the house. When she returned home at 2:00 PM, she found him on his knees gripping his walker and unable to stand up on his own. She noted him to be weak all over, but primarily in his legs. He was lucid with no dysarthria at that time. EMS was called and sister states that when they arrived the patient started to deteriorate in terms of level of alertness, ability to answer questions and had new onset of dysarthria.  Sister states that he got out of rehab about 2 weeks ago for a fractured left hip. She states that decision was to pursue PT without operative intervention. She states that he is fully functioning at baseline, still works as an Forensic psychologist, but has been using a walker since fracturing his hip. He does not require any assistance with his ADLs. She states he did have a stroke in the past, but she was not aware of any residual deficit from the stroke.   The patient is able to state that he does not think he is having a stroke. He is unable to give a reason why he is in the hospital, other than "I fell" and that he has been nauseated and dizzy. He endorses lightheadedness but denies vertigo. He is oriented to the city, state, month and year, but not the day of the week. Speech is dysarthric. He states that he is thirsty.   ED Course: Creatinine is at baseline with normal electrolytes. VBG with normal pH. CBC with mild leukopenia with a white count of 3.8 but stable hemoglobin of 11.   Lactic acid is mildly elevated at 2.7. Troponin is 60  Digoxin levels drawn.   Chest x-ray with findings consistent with cardiomegaly and mild overload.    Past Medical History:  Diagnosis Date   Alcoholic cardiomyopathy ([place])    Bradycardia    Chronic systolic CHF (congestive heart failure) ([facility])    Common peroneal neuropathy of left lower extremity    COPD (chronic obstructive pulmonary disease) ([place])    Coronary artery disease    a. s/p remote stent to Cx 2004 with chronic stable angina in context of residual circumflex disease.   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 ([place]) 09/25/2015   Hyperlipidemia    Hypertension    Long term (current) use of anticoagulants    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/25/2015   NSVT (nonsustained ventricular tachycardia) ([facility])    Permanent atrial fibrillation ([facility])    Pulmonary eosinophilia ([facility])    Sinus bradycardia    Stroke [facility])    Syncope and collapse    Unspecified glaucoma(365.9)     Past Surgical History:  Procedure Laterality Date   CARDIAC CATHETERIZATION N/A 07/30/2015   Procedure: Left Heart Cath and Coronary Angiography;  Surgeon: Belva Crome, MD;  Location: [facility];  Service: Cardiovascular;  Laterality: N/A;   COLONOSCOPY     KNEE SURGERY     loop recorder  09/2007   RIGHT/LEFT HEART CATH AND CORONARY ANGIOGRAPHY N/A 09/20/2021   Procedure: RIGHT/LEFT HEART CATH AND CORONARY ANGIOGRAPHY;  Surgeon: Martinique, Peter M, MD;  Location: [place] [facility];  Service: Cardiovascular;  Laterality: N/A;    Family History  Problem Relation Age of Onset   Colon cancer Mother    Hypertension Mother    Cancer Mother    Prostate cancer Brother    Cancer Brother    Heart disease Father    Heart attack Father    Hypertension Father    Heart attack Sister    Cancer Sister    Stroke Neg Hx    Colon polyps Neg Hx    Esophageal cancer Neg Hx    Stomach cancer Neg Hx    Rectal cancer Neg Hx    Pancreatic cancer Neg  Hx               Social History:  reports that he quit smoking about 39 years ago. His smoking use included cigarettes. He has a 7.50 pack-year smoking history. He has never used smokeless tobacco. He reports that he does not drink alcohol and does not use drugs.  No Known Allergies  HOME MEDICATIONS:                                                                                                                      No current facility-administered medications on file prior to encounter.   Current Outpatient Medications on File Prior to Encounter  Medication Sig Dispense Refill   acetaminophen (TYLENOL) 500 MG tablet Take 2 tablets (1,000 mg total) by mouth every 6 (six) hours as needed. (Patient taking differently: Take 1,000 mg by mouth every 6 (six) hours as needed for moderate pain or mild pain.) 30 tablet 0   apixaban (ELIQUIS) 5 MG TABS tablet Take 1 tablet (5 mg total) by mouth 2 (two) times daily. 60 tablet 5   atorvastatin (LIPITOR) 80 MG tablet Take 1 tablet (80 mg total) by mouth daily. 30 tablet 11   Brimonidine Tartrate (ALPHAGAN P OP) Place 1 drop into both eyes in the morning and at bedtime.     Calcium Carbonate-Vitamin D (OSCAL 500 D-3 PO) Take 1 tablet by mouth 2 (two) times daily.     carvedilol (COREG) 6.25 MG tablet Take 1 tablet (6.25 mg total) by mouth 2 (two) times daily with a meal. 60 tablet 11   Cyanocobalamin (VITAMIN B-12 CR PO) Take 1 tablet by mouth daily.     digoxin (LANOXIN) 0.125 MG tablet Take 1 tablet (0.125 mg total) by mouth daily. 90 tablet 3   empagliflozin (JARDIANCE) 25 MG TABS tablet Take 25 mg by mouth daily.     famotidine (PEPCID) 20 MG tablet Take 20 mg by mouth daily.     Ferrous Sulfate (IRON PO) Take 1 tablet by mouth daily.     furosemide (LASIX) 40 MG tablet Take 40 mg by mouth daily.     isosorbide mononitrate (IMDUR) 30 MG 24 hr tablet Take 1 tablet (30 mg total) by mouth daily. 90 tablet 3   loratadine (CLARITIN) 10 MG tablet Take 10 mg by mouth  daily.     LUMIGAN 0.01 % SOLN Place 1 drop into both eyes at bedtime.     Magnesium 400 MG TABS Take 400 mg by mouth 2 (two) times daily.     metFORMIN (GLUCOPHAGE) 500 MG tablet Take 500 mg by mouth 2 (two) times daily.     nitroGLYCERIN (NITROLINGUAL) 0.4 MG/SPRAY spray Place 1 spray under the tongue every 5 (five) minutes x 3 doses as needed for chest pain. 12 g 6   Omega-3 Fatty Acids (OMEGA-3 FISH OIL PO) Take 1 capsule by mouth daily.     oxyCODONE (OXY IR/ROXICODONE) 5 MG immediate release tablet Take 1 tablet (5  mg total) by mouth every 6 (six) hours as needed for severe pain. 10 tablet 0   sacubitril-valsartan (ENTRESTO) 97-103 MG Take 1 tablet by mouth 2 (two) times daily. 60 tablet 11   spironolactone (ALDACTONE) 25 MG tablet Take 25 mg by mouth daily.       ROS:                                                                                                                                       As per HPI.    Blood pressure (!) 161/75, pulse 65, temperature (!) 97.5 F (36.4 C), temperature source Oral, resp. rate 19, SpO2 91 %.   General Examination:                                                                                                       Physical Exam  HEENT-  [place]/AT    Lungs- Respirations unlabored Extremities- Bilateral pretibial edema with chronic skin changes   Neurological Examination Mental Status: Drowsy to somnolent. At times opens eyes to voice alone, but predominantly requires light sternal rub or other tactile stimuli with loud vocalizations intermittently to maintain arousal sufficient to answer questions and follow commands. Severe dysarthria. Comprehension for basic questions and commands is intact. Able to name a thumb, pinky finger, his nose and his ear. Poor insight with anosognosia. Adamantly refuses to believe that he is having a stroke.  Cranial Nerves: II: Left homonymous hemianopsia. Has difficulty fixating on visual stimuli. Can visually  identify the examiner's gloved hand and two fingers. Can count one and two fingers but unable to count 3 or more fingers. Eyelids often have to be held open by examiner for patient to attend to visual stimuli.  III,IV, VI: Has difficulty opening eyes throughout exam, but with enough stimulation will elevate eyelids fully and symmetrically before closing them again. Severe difficulty fixating on visual stimuli and cannot fixate on left sided stimuli by moving globes in that direction. Eyes are conjugate and near the midline. Can track to the right with difficulty, but not to the left past midline after several trials, which cannot be overcome with oculocephalic maneuver. Spontaneous as well as gaze evoked multi-directional nystagmus is noted.  V: Weak corneal reflexes bilaterally VII: Mild left facial droop.  VIII: Hearing intact to voice IX,X: Mild hoarseness noted.  XI: Head is preferentially rotated to the right XII: Does not follow command for testing. Severe dysarthria.  Motor: Tetraparesis slightly worse to LUE.  4/5 RUE strength proximally and distally.  4-/5 LUE strength proximally and distally Unable to elevate RLE antigravity at hip or knee (2/5). Distal RLE strength 1-2/5 Able to briefly elevate LLE antigravity at hip or knee but for not more than 5 seconds. Distal LLE strength 1-2/5 Sensory: Grossly intact to touch in all 4 extremities.  Deep Tendon Reflexes: 1+ bilateral brachioradialis and biceps. 0 left patella. 1+ left patella. Toes mute bilaterally.  Cerebellar: Significant difficulty with targeting during FNF, worse on the left, but no gross ataxia noted. Unable to test H-S.  Gait: Unable to assess   Lab Results: Basic Metabolic Panel: Recent Labs  Lab 09/06/2022 1520 09/16/2022 1547  NA 140 143  144  K 3.3* 3.7  3.7  CL 106 103  CO2 26  --   GLUCOSE 152* 153*  BUN 8 8  CREATININE 1.09 1.00  CALCIUM 8.8*  --     CBC: Recent Labs  Lab 09/26/2022 1520 08/29/2022 1547   WBC 3.8*  --   NEUTROABS 2.9  --   HGB 11.1* 12.2*  12.2*  HCT 36.7* 36.0*  36.0*  MCV 88.2  --   PLT 149*  --     Cardiac Enzymes: Recent Labs  Lab 09/27/2022 1520  CKTOTAL 59    Lipid Panel: No results for input(s): "CHOL", "TRIG", "HDL", "CHOLHDL", "VLDL", "LDLCALC" in the last 168 hours.  Imaging: US Abdomen Limited RUQ (LIVER/GB)  Result Date: 09/10/2022 CLINICAL DATA:  Vomiting EXAM: ULTRASOUND ABDOMEN LIMITED RIGHT UPPER QUADRANT COMPARISON:  CT 09/18/2022 earlier.  Older exams as well. FINDINGS: Gallbladder: Gallbladder is mildly distended with dependent stones. No wall thickening. There is some trace ascites. Common bile duct: Diameter: 3 mm Liver: No focal lesion identified. Within normal limits in parenchymal echogenicity. Portal vein is patent on color Doppler imaging with normal direction of blood flow towards the liver. Other: Trace free fluid. IMPRESSION: Distended gallbladder with stones. No ductal dilatation. No further sonographic evidence of acute cholecystitis at this time. Electronically Signed   By: Jill Side M.D.   On: 09/03/2022 18:30   CT Head Wo Contrast  Result Date: 09/23/2022 CLINICAL DATA:  Neck trauma (Age >= 65y); Head trauma, minor (Age >= 65y) EXAM: CT HEAD WITHOUT CONTRAST CT CERVICAL SPINE WITHOUT CONTRAST TECHNIQUE: Multidetector CT imaging of the head and cervical spine was performed following the standard protocol without intravenous contrast. Multiplanar CT image reconstructions of the cervical spine were also generated. RADIATION DOSE REDUCTION: This exam was performed according to the departmental dose-optimization program which includes automated exposure control, adjustment of the mA and/or kV according to patient size and/or use of iterative reconstruction technique. COMPARISON:  CT head and C-spine 07/20/2022 FINDINGS: CT HEAD FINDINGS Brain: Cerebral ventricle sizes are concordant with the degree of cerebral volume loss. Chronic right middle  cerebral artery distribution infarction with associated encephalomalacia. Chronic bilateral cerebellar infarctions. Patchy and confluent areas of decreased attenuation are noted throughout the deep and periventricular white matter of the cerebral hemispheres bilaterally, compatible with chronic microvascular ischemic disease. No evidence of large-territorial acute infarction. No parenchymal hemorrhage. No mass lesion. No extra-axial collection. No mass effect or midline shift. No hydrocephalus. Basilar cisterns are patent. Vascular: No hyperdense vessel. Skull: No acute fracture or focal lesion. Sinuses/Orbits: Paranasal sinuses and mastoid air cells are clear. Bilateral lens replacement. Otherwise the orbits are unremarkable. Other: None. CT CERVICAL SPINE FINDINGS Alignment: Normal. Skull base and vertebrae: Multilevel moderate severe degenerative changes of the spine with associated posterior  disc osteophyte complex formation at the C3-C4 level and C4-C5 level. Possible smaller posterior disc osteophyte complex formation at the C6-C7 level. No associated severe osseous neural foraminal or central canal stenosis. No acute fracture. No aggressive appearing focal osseous lesion or focal pathologic process. Soft tissues and spinal canal: No prevertebral fluid or swelling. No visible canal hematoma. Upper chest: Unremarkable. Other: None. IMPRESSION: 1. No acute intracranial abnormality. 2. No acute displaced fracture or traumatic listhesis of the cervical spine. Electronically Signed   By: Iven Finn M.D.   On: 09/25/2022 17:32   CT Cervical Spine Wo Contrast  Result Date: 09/20/2022 CLINICAL DATA:  Neck trauma (Age >= 65y); Head trauma, minor (Age >= 65y) EXAM: CT HEAD WITHOUT CONTRAST CT CERVICAL SPINE WITHOUT CONTRAST TECHNIQUE: Multidetector CT imaging of the head and cervical spine was performed following the standard protocol without intravenous contrast. Multiplanar CT image reconstructions of the  cervical spine were also generated. RADIATION DOSE REDUCTION: This exam was performed according to the departmental dose-optimization program which includes automated exposure control, adjustment of the mA and/or kV according to patient size and/or use of iterative reconstruction technique. COMPARISON:  CT head and C-spine 07/20/2022 FINDINGS: CT HEAD FINDINGS Brain: Cerebral ventricle sizes are concordant with the degree of cerebral volume loss. 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 Chronic bilateral cerebellar infarctions. Patchy and confluent areas of decreased attenuation are noted throughout the deep and periventricular white matter of the cerebral hemispheres bilaterally, compatible with chronic microvascular ischemic disease. No evidence of large-territorial acute infarction. No parenchymal hemorrhage. No mass lesion. No extra-axial collection. No mass effect or midline shift. No hydrocephalus. Basilar cisterns are patent. Vascular: No hyperdense vessel. Skull: No acute fracture or focal lesion. Sinuses/Orbits: Paranasal sinuses and mastoid air cells are clear. Bilateral lens replacement. Otherwise the orbits are unremarkable. Other: None. CT CERVICAL SPINE FINDINGS Alignment: Normal. Skull base and vertebrae: Multilevel moderate severe degenerative changes of the spine with associated posterior disc osteophyte complex formation at the C3-C4 level and C4-C5 level. Possible smaller posterior disc osteophyte complex formation at the C6-C7 level. No associated severe osseous neural foraminal or central canal stenosis. No acute fracture. No aggressive appearing focal osseous lesion or focal pathologic process. Soft tissues and spinal canal: No prevertebral fluid or swelling. No visible canal hematoma. Upper chest: Unremarkable. Other: None. IMPRESSION: 1. No acute intracranial abnormality. 2. No acute displaced fracture or traumatic listhesis of the  cervical spine. Electronically Signed   By: Iven Finn M.D.   On: 09/21/2022 17:32   CT CHEST ABDOMEN PELVIS W CONTRAST  Result Date: 08/29/2022 CLINICAL DATA:  Sepsis.  Fall.  Unknown time down. EXAM: CT CHEST, ABDOMEN, AND PELVIS WITH CONTRAST TECHNIQUE: Multidetector CT imaging of the chest, abdomen and pelvis was performed following the standard protocol during bolus administration of intravenous contrast. RADIATION DOSE REDUCTION: This exam was performed according to the departmental dose-optimization program which includes automated exposure control, adjustment of the mA and/or kV according to patient size and/or use of iterative reconstruction technique. CONTRAST:  49m OMNIPAQUE IOHEXOL 350 MG/ML SOLN COMPARISON:  X-ray thoracic and lumbar spine 03/31/2020 FINDINGS: CHEST: Cardiovascular: No aortic injury. The thoracic aorta is normal in caliber. The heart is enlarged in size. No significant pericardial effusion. Atherosclerotic plaque. Four-vessel coronary calcification. Mediastinum/Nodes: No pneumomediastinum. No mediastinal hematoma. The esophagus is unremarkable. The thyroid is unremarkable. The central airways are patent. No mediastinal, hilar, or axillary lymphadenopathy. Lungs/Pleura: Peribronchovascular ground-glass airspace opacities of the lingula and  bilateral lower lobes. No pulmonary nodule. No pulmonary mass. No pulmonary contusion or laceration. No pneumatocele formation. Trace right pleural effusion. Possible tiny trace left pleural effusion. No pneumothorax. No hemothorax. Musculoskeletal/Chest wall: No chest wall mass. No acute rib or sternal fracture. Old healed sternal fracture. Old healed left rib fractures. No spinal fracture. Mild to moderate degenerative changes of the shoulders, right greater than left. ABDOMEN / PELVIS: Hepatobiliary: Not enlarged. Heterogeneous appearance of the hepatic parenchyma likely due to streak artifact originating from the bilateral upper  extremities along the patient's sides. No focal lesion. No laceration or subcapsular hematoma. Calcified gallstones within the gallbladder lumen. Suggestion of fundal gallbladder wall thickening/pericholecystic fluid. No biliary ductal dilatation. Pancreas: Normal pancreatic contour. No main pancreatic duct dilatation. Spleen: Not enlarged. No focal lesion. No laceration, subcapsular hematoma, or vascular injury. Adrenals/Urinary Tract: No nodularity bilaterally. Bilateral kidneys enhance symmetrically. No hydronephrosis. No contusion, laceration, or subcapsular hematoma. Fluid dense lesion of the right kidney likely represents a simple renal cyst. Simple renal cysts, in the absence of clinically indicated signs/symptoms, require no independent follow-up. No injury to the vascular structures or collecting systems. No hydroureter. The urinary bladder is unremarkable. On delayed imaging, there is no urothelial wall thickening and there are no filling defects in the opacified portions of the bilateral collecting systems or ureters. Stomach/Bowel: No small or large bowel wall thickening or dilatation. Colonic diverticulosis. The appendix is unremarkable. Vasculature/Lymphatics: Severe atherosclerotic plaque. No abdominal aorta or iliac aneurysm. No active contrast extravasation or pseudoaneurysm. No abdominal, pelvic, inguinal lymphadenopathy. Reproductive: Prostate is borderline enlarged in size. Partially visualized at least small to moderate volume right hydrocele. Other: No simple free fluid ascites. No pneumoperitoneum. No hemoperitoneum. No mesenteric hematoma identified. No organized fluid collection. Musculoskeletal: No significant soft tissue hematoma. No acute pelvic fracture. No acute spinal fracture. Chronic compression fracture of the L1 and L4 vertebral bodies grossly stable in appearance. Grade 1 anterolisthesis of L3 on L4 and L4 on L5. Multilevel degenerative changes of the spine. Ports and Devices:  None. IMPRESSION: 1. Peribronchovascular ground-glass airspace opacities of the lingula and bilateral lower lobes. Findings suggestive of infection/inflammation and consistent with chest x-ray. Followup PA and lateral chest X-ray is recommended in 3-4 weeks following therapy to ensure resolution. 2. Trace right pleural effusion. Possible tiny trace left pleural effusion. 3. Cholelithiasis with suggestion of fundal gallbladder wall thickening/pericholecystic fluid. Correlate with liver function tests. Consider right upper quadrant ultrasound for a more sensitive evaluation of the gallbladder if clinically indicated. 4. No acute traumatic injury to the chest, abdomen, or pelvis. 5. No acute fracture or traumatic malalignment of the thoracic or lumbar spine. 6. Other imaging findings of potential clinical significance: Colonic diverticulosis with no acute diverticulitis. Cardiomegaly. Borderline prostatomegaly. Partially visualized at least small to moderate volume right hydrocele .Aortic Atherosclerosis (ICD10-I70.0) -including four-vessel coronary artery calcification. Electronically Signed   By: Iven Finn M.D.   On: 09/28/2022 17:26   DG Chest Port 1 View  Result Date: 09/12/2022 CLINICAL DATA:  Shortness of breath and weakness. EXAM: PORTABLE CHEST 1 VIEW COMPARISON:  07/20/2022 FINDINGS: Patient is slightly rotated to the right. Lungs are adequately inflated demonstrate subtle hazy prominence of the pulmonary vasculature as well as minimal bibasilar density. Findings likely due to mild interstitial edema/vascular congestion. Not exclude infection in the lung bases. Stable cardiomegaly. Metallic device projects over the anterior left chest wall unchanged. Remainder of the exam is unchanged. IMPRESSION: 1. Findings suggesting mild interstitial edema/vascular congestion. Infection in the  lung bases is possible. 2. Stable cardiomegaly. Electronically Signed   By: Marin Olp M.D.   On: 09/22/2022 15:35     TTE 03/29/22: 1. Left ventricular ejection fraction, by estimation, is 25 to 30%. The  left ventricle has severely decreased function. The left ventricle  demonstrates global hypokinesis. The left ventricular internal cavity size  was moderately to severely dilated. Left ventricular diastolic function could not be evaluated.   2. Right ventricular systolic function is mildly reduced. The right  ventricular size is mildly enlarged. There is mildly elevated pulmonary  artery systolic pressure. The estimated right ventricular systolic  pressure is 00.8 mmHg.   3. Left atrial size was moderately dilated.   4. Right atrial size was moderately dilated.   5. The mitral valve is normal in structure. Mild to moderate mitral valve  regurgitation.   6. The aortic valve is tricuspid. Aortic valve regurgitation is not  visualized. No aortic stenosis is present.   7. The inferior vena cava is normal in size with greater than 50%  respiratory variability, suggesting right atrial pressure of 3 mmHg.    Assessment: 87 y.o. male with a PMHx of alcoholic cardiomyopathy, permanent atrial fibrillation on Eliquis, CHF with an EF 25 to 30% with global hypokinesis, CAD s/p stenting, DM2, prior stroke, chronic bilateral lower extremity wounds with edema, LLE peroneal neuropathy with chronic foot drop, COPD, HLD and HTN who presented to the ED via EMS this afternoon after he was found down by his sister. LKN was 1:00 PM.  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 Code Stroke was called at that time.  - Exam reveals depressed level of consciousness, tetraparesis with LUE weaker than left, mild left facial droop, severe dysarthria, left hemianopsia, inability to gaze past midline to the left that cannot be overcome with oculocephalic maneuver and spontaneous as well as gaze evoked multi-directional nystagmus. NIHSS: 15 - Fully functional at baseline. mRS: 0 -  CT head: No acute intracranial abnormality. Chronic  right middle cerebral artery distribution infarction with associated encephalomalacia. Chronic bilateral cerebellar infarctions. Patchy and confluent areas of decreased attenuation are noted throughout the deep and periventricular white matter of the cerebral hemispheres bilaterally, compatible with chronic microvascular ischemic disease. - CTA of head: Occlusion of the left V4 segment and poor opacification of the basilar artery with apparent occlusion distally, age indeterminate. The bilateral PCAs are supplied by posterior communicating arteries with likely retrograde flow into the P1 segments. Multifocal high-grade stenosis of the right M2 and M3 branches relatively diminished perfusion corresponding to the chronic MCA distribution infarct. Moderate to severe stenosis in the bilateral carotid siphons. - Not a TNK candidate due to anticoagulation - The patient is a VIR candidate. Significant time required to obtain consent from patient's sister, Breion Novacek, who is his HCPOA, after the patient was determined to be a potential endovascular candidate. Patient was deemed not competent to make his own medical decisions based on anosognosia regarding his stroke. The patient did state that he wanted his sister to make final decision regarding his care while she was in the room and she elected to proceed with VIR after full risks/benefits were discussed, as follows: Risks/benefits of the procedure were discussed extensively with the patient's sister, including approximately 50% chance of significant improvement relative to an approximate 10% chance of subarachnoid hemorrhage with possibility of significant worsening including death. Distribution of possible outcomes were felt to be shifted more towards poor neurological functioning even with thrombectomy given difficulty of  accessing the basilar artery given small caliber posterior circulation vessels in the context of fetal PCAs, as well as relatively high  likelihood of presence of irreversible brainstem infarction. Possible poor outcomes with or without treatment were discussed extensively with the patient's sister, including locked in state, tetraplegia and death, with the distribution being weighted more towards poor outcomes, as described above. Overall, it is felt that his likelihood of a good outcome is increased with intervention relative to no intervention, despite overall prognosis being felt to be more likely to be poor regardless of treatment. The patient's sister expressed understanding and provided informed consent to proceed with VIR. All questions answered. Dr. Estanislado Pandy also participated in the consent process by speakerphone. Consent witnessed by Irma Newness, Rapid Response Nurse.  - After the patient's sister left, the patient began stating that he did not want to undergo treatment. Two attempts to call sister for additional discussion failed. Code IR was deactivated due to concern for possibly treating a competent patient against his wishes. We were able to achieve telephone contact with sister again about 10 minutes after patient refused treatment. The patient's sister reiterated that despite his refusal, she was his HCPOA and felt that despite his being oriented x 4, her overall impression was that he was not competent to refuse treatment after consent was given by HCPOA. Brief reexamination revealed that patient continued to be unaware of his stroke with anosognosia and poor insight. Code IR was reactivated with sister's consent.    Recommendations: - Admitting to Neuro ICU following VIR  - Post-VIR order set to include frequent neuro checks and BP management.  - If stenting is required, may need antiplatelet medication, otherwise no anticoagulation or antiplatelet medications x 24 hours following VIR.   - DVT prophylaxis with SCDs.  - Continue atorvastatin.  - Can restart anticoagulation if follow up CT at 24 hours is negative for  hemorrhagic conversion. - Repeat TTE has been ordered (last TTE in August 2023).  - MRI brain  - PT/OT/Speech.  - NPO until passes swallow evaluation.  - Sliding scale insulin.  - Telemetry monitoring - Fasting lipid panel, HgbA1c - Family requests to be contacted via the following telephone numbers:  - [place]ao, [place]: [phone] - Hulen Skains: [phone] - Will need CCM consult if not extubated after procedure - Will need Cardiology consult in the AM for optimization of his cardiac medications given his CHF, atrial fibrillation and HTN  - Discussed code status with patient's sister. She would like him to be full code.   100 minutes spent in the neurological evaluation and management of this critically ill patient.   Electronically signed: Dr. Kerney Elbe 08/30/2022, 7:13 PM

## 2022-09-05 NOTE — ED Notes (Signed)
Called to Activate Code Stroke -per Dr. Cheral Marker

## 2022-09-06 ENCOUNTER — Inpatient Hospital Stay (HOSPITAL_COMMUNITY): Payer: Medicare Other

## 2022-09-06 ENCOUNTER — Encounter (HOSPITAL_COMMUNITY): Payer: Self-pay | Admitting: Neurology

## 2022-09-06 DIAGNOSIS — I6389 Other cerebral infarction: Secondary | ICD-10-CM

## 2022-09-06 DIAGNOSIS — I651 Occlusion and stenosis of basilar artery: Secondary | ICD-10-CM | POA: Diagnosis present

## 2022-09-06 DIAGNOSIS — I639 Cerebral infarction, unspecified: Secondary | ICD-10-CM | POA: Diagnosis not present

## 2022-09-06 DIAGNOSIS — I482 Chronic atrial fibrillation, unspecified: Secondary | ICD-10-CM

## 2022-09-06 DIAGNOSIS — I255 Ischemic cardiomyopathy: Secondary | ICD-10-CM | POA: Diagnosis not present

## 2022-09-06 DIAGNOSIS — J9611 Chronic respiratory failure with hypoxia: Secondary | ICD-10-CM

## 2022-09-06 HISTORY — PX: IR ANGIO INTRA EXTRACRAN SEL INTERNAL CAROTID UNI R MOD SED: IMG5362

## 2022-09-06 HISTORY — PX: IR US GUIDE VASC ACCESS RIGHT: IMG2390

## 2022-09-06 HISTORY — PX: IR PERCUTANEOUS ART THROMBECTOMY/INFUSION INTRACRANIAL INC DIAG ANGIO: IMG6087

## 2022-09-06 HISTORY — PX: IR CT HEAD LTD: IMG2386

## 2022-09-06 LAB — ECHOCARDIOGRAM COMPLETE
AR max vel: 2.28 cm2
AV Area VTI: 2.11 cm2
AV Area mean vel: 2.11 cm2
AV Mean grad: 7 mmHg
AV Peak grad: 12.8 mmHg
Ao pk vel: 1.79 m/s
Height: 69 in
MV M vel: 4.55 m/s
MV Peak grad: 82.8 mmHg
MV VTI: 2.48 cm2
S' Lateral: 6.4 cm
Single Plane A4C EF: 24.6 %
Weight: 2670.21 oz

## 2022-09-06 LAB — POCT ACTIVATED CLOTTING TIME: Activated Clotting Time: 0 seconds

## 2022-09-06 LAB — BASIC METABOLIC PANEL
Anion gap: 6 (ref 5–15)
BUN: 6 mg/dL — ABNORMAL LOW (ref 8–23)
CO2: 25 mmol/L (ref 22–32)
Calcium: 7.9 mg/dL — ABNORMAL LOW (ref 8.9–10.3)
Chloride: 108 mmol/L (ref 98–111)
Creatinine, Ser: 0.84 mg/dL (ref 0.61–1.24)
GFR, Estimated: >60 mL/min (ref >60)
Glucose, Bld: 90 mg/dL (ref 70–99)
Potassium: 3.2 mmol/L — ABNORMAL LOW (ref 3.5–5.1)
Sodium: 139 mmol/L (ref 135–145)

## 2022-09-06 LAB — CBC WITH DIFFERENTIAL/PLATELET
Abs Immature Granulocytes: 0.04 10*3/uL (ref 0.00–0.07)
Basophils Absolute: 0 10*3/uL (ref 0.0–0.1)
Basophils Relative: 0 %
Eosinophils Absolute: 0 10*3/uL (ref 0.0–0.5)
Eosinophils Relative: 0 %
HCT: 30.3 % — ABNORMAL LOW (ref 39.0–52.0)
Hemoglobin: 9.5 g/dL — ABNORMAL LOW (ref 13.0–17.0)
Immature Granulocytes: 0 %
Lymphocytes Relative: 3 %
Lymphs Abs: 0.3 10*3/uL — ABNORMAL LOW (ref 0.7–4.0)
MCH: 26.9 pg (ref 26.0–34.0)
MCHC: 31.4 g/dL (ref 30.0–36.0)
MCV: 85.8 fL (ref 80.0–100.0)
Monocytes Absolute: 0.7 10*3/uL (ref 0.1–1.0)
Monocytes Relative: 7 %
Neutro Abs: 9.8 10*3/uL — ABNORMAL HIGH (ref 1.7–7.7)
Neutrophils Relative %: 90 %
Platelets: 140 10*3/uL — ABNORMAL LOW (ref 150–400)
RBC: 3.53 MIL/uL — ABNORMAL LOW (ref 4.22–5.81)
RDW: 18.6 % — ABNORMAL HIGH (ref 11.5–15.5)
WBC: 10.8 10*3/uL — ABNORMAL HIGH (ref 4.0–10.5)
nRBC: 0 % (ref 0.0–0.2)

## 2022-09-06 LAB — LIPID PANEL
Cholesterol: 129 mg/dL (ref 0–200)
HDL: 58 mg/dL (ref >40)
LDL Cholesterol: 56 mg/dL (ref 0–99)
Total CHOL/HDL Ratio: 2.2 RATIO
Triglycerides: 75 mg/dL (ref <150)
VLDL: 15 mg/dL (ref 0–40)

## 2022-09-06 LAB — MRSA NEXT GEN BY PCR, NASAL: MRSA by PCR Next Gen: NOT DETECTED

## 2022-09-06 LAB — GLUCOSE, CAPILLARY
Glucose-Capillary: 153 mg/dL — ABNORMAL HIGH (ref 70–99)
Glucose-Capillary: 110 mg/dL — ABNORMAL HIGH (ref 70–99)
Glucose-Capillary: 94 mg/dL (ref 70–99)
Glucose-Capillary: 98 mg/dL (ref 70–99)
Glucose-Capillary: 96 mg/dL (ref 70–99)

## 2022-09-06 LAB — APTT
aPTT: 176 seconds (ref 24–36)
aPTT: >200 seconds (ref 24–36)

## 2022-09-06 LAB — MAGNESIUM: Magnesium: 1.8 mg/dL (ref 1.7–2.4)

## 2022-09-06 LAB — HEMOGLOBIN A1C
Hgb A1c MFr Bld: 6.5 % — ABNORMAL HIGH (ref 4.8–5.6)
Mean Plasma Glucose: 139.85 mg/dL

## 2022-09-06 LAB — HEPARIN LEVEL (UNFRACTIONATED): Heparin Unfractionated: 0.58 IU/mL (ref 0.30–0.70)

## 2022-09-06 MED ORDER — HEPARIN SODIUM (PORCINE) 5000 UNIT/ML IJ SOLN
5000.0000 [IU] | Freq: Three times a day (TID) | INTRAMUSCULAR | Status: DC
  Administered 2022-09-06 – 2022-09-07 (×4): 5000 [IU] via SUBCUTANEOUS
  Filled 2022-09-06 (×3): qty 1

## 2022-09-06 MED ORDER — DIGOXIN 125 MCG PO TABS
0.1250 mg | ORAL_TABLET | Freq: Every day | ORAL | Status: DC
  Administered 2022-09-06: 0.125 mg
  Filled 2022-09-06 (×2): qty 1

## 2022-09-06 MED ORDER — SODIUM CHLORIDE 0.9 % IV SOLN: INTRAVENOUS | Status: DC

## 2022-09-06 MED ORDER — POTASSIUM CHLORIDE 20 MEQ PO PACK
40.0000 meq | PACK | Freq: Once | ORAL | Status: AC
  Administered 2022-09-06: 40 meq
  Filled 2022-09-06: qty 2

## 2022-09-06 MED ORDER — ORAL CARE MOUTH RINSE: 15.0000 mL | OROMUCOSAL | Status: DC | PRN

## 2022-09-06 MED ORDER — PROPOFOL 1000 MG/100ML IV EMUL
0.0000 ug/kg/min | INTRAVENOUS | Status: DC
  Administered 2022-09-06: 25 ug/kg/min via INTRAVENOUS
  Administered 2022-09-06: 5 ug/kg/min via INTRAVENOUS
  Administered 2022-09-06: 10 ug/kg/min via INTRAVENOUS
  Administered 2022-09-06: 50 ug/kg/min via INTRAVENOUS
  Filled 2022-09-06 (×3): qty 100

## 2022-09-06 MED ORDER — CLEVIDIPINE BUTYRATE 0.5 MG/ML IV EMUL: 0.0000 mg/h | INTRAVENOUS | Status: DC

## 2022-09-06 MED ORDER — HEPARIN (PORCINE) 25000 UT/250ML-% IV SOLN
800.0000 [IU]/h | INTRAVENOUS | Status: DC
  Administered 2022-09-06: 800 [IU]/h via INTRAVENOUS

## 2022-09-06 MED ORDER — PERFLUTREN LIPID MICROSPHERE
1.0000 mL | INTRAVENOUS | Status: AC | PRN
  Administered 2022-09-06: 6 mL via INTRAVENOUS

## 2022-09-06 MED ORDER — BRIMONIDINE TARTRATE 0.15 % OP SOLN
1.0000 [drp] | Freq: Two times a day (BID) | OPHTHALMIC | Status: DC
  Administered 2022-09-06 – 2022-09-19 (×26): 1 [drp] via OPHTHALMIC
  Filled 2022-09-06: qty 5

## 2022-09-06 MED ORDER — HEPARIN (PORCINE) 25000 UT/250ML-% IV SOLN
1000.0000 [IU]/h | INTRAVENOUS | Status: DC
  Administered 2022-09-06: 1000 [IU]/h via INTRAVENOUS
  Filled 2022-09-06: qty 250

## 2022-09-06 MED ORDER — ASPIRIN 325 MG PO TABS
325.0000 mg | ORAL_TABLET | Freq: Every day | ORAL | Status: DC
  Administered 2022-09-06 – 2022-09-16 (×11): 325 mg
  Filled 2022-09-06 (×11): qty 1

## 2022-09-06 MED ORDER — PROPOFOL 500 MG/50ML IV EMUL
INTRAVENOUS | Status: DC | PRN
  Administered 2022-09-06: 50 ug/kg/min via INTRAVENOUS

## 2022-09-06 MED ORDER — ACETAMINOPHEN 650 MG RE SUPP: 650.0000 mg | RECTAL | Status: DC | PRN

## 2022-09-06 MED ORDER — SPIRONOLACTONE 25 MG PO TABS: 25.0000 mg | ORAL_TABLET | Freq: Every day | ORAL | Status: DC

## 2022-09-06 MED ORDER — PROPOFOL 1000 MG/100ML IV EMUL: 0.0000 ug/kg/min | INTRAVENOUS | Status: DC

## 2022-09-06 MED ORDER — DOCUSATE SODIUM 50 MG/5ML PO LIQD
100.0000 mg | Freq: Two times a day (BID) | ORAL | Status: DC
  Administered 2022-09-06 – 2022-09-19 (×17): 100 mg
  Filled 2022-09-06 (×17): qty 10

## 2022-09-06 MED ORDER — CHLORHEXIDINE GLUCONATE CLOTH 2 % EX PADS
6.0000 | MEDICATED_PAD | Freq: Every day | CUTANEOUS | Status: DC
  Administered 2022-09-06 – 2022-09-13 (×9): 6 via TOPICAL

## 2022-09-06 MED ORDER — ATORVASTATIN CALCIUM 80 MG PO TABS: 80.0000 mg | ORAL_TABLET | Freq: Every day | ORAL | Status: DC

## 2022-09-06 MED ORDER — CLEVIDIPINE BUTYRATE 0.5 MG/ML IV EMUL
0.0000 mg/h | INTRAVENOUS | Status: DC
  Filled 2022-09-06: qty 100
  Filled 2022-09-06: qty 50

## 2022-09-06 MED ORDER — ATORVASTATIN CALCIUM 40 MG PO TABS: 80.0000 mg | ORAL_TABLET | Freq: Every day | ORAL | Status: DC

## 2022-09-06 MED ORDER — ORAL CARE MOUTH RINSE
15.0000 mL | OROMUCOSAL | Status: DC
  Administered 2022-09-06 – 2022-09-19 (×152): 15 mL via OROMUCOSAL

## 2022-09-06 MED ORDER — MAGNESIUM SULFATE 2 GM/50ML IV SOLN
2.0000 g | Freq: Once | INTRAVENOUS | Status: AC
  Administered 2022-09-06: 2 g via INTRAVENOUS
  Filled 2022-09-06: qty 50

## 2022-09-06 MED ORDER — ACETAMINOPHEN 160 MG/5ML PO SOLN: 650.0000 mg | ORAL | Status: DC | PRN

## 2022-09-06 MED ORDER — FENTANYL CITRATE PF 50 MCG/ML IJ SOSY
25.0000 ug | PREFILLED_SYRINGE | INTRAMUSCULAR | Status: DC | PRN
  Administered 2022-09-07 (×2): 25 ug via INTRAVENOUS
  Administered 2022-09-08 – 2022-09-15 (×8): 50 ug via INTRAVENOUS
  Administered 2022-09-15 – 2022-09-16 (×2): 100 ug via INTRAVENOUS
  Administered 2022-09-17 (×2): 50 ug via INTRAVENOUS
  Filled 2022-09-06 (×4): qty 1
  Filled 2022-09-06: qty 2
  Filled 2022-09-06 (×2): qty 1
  Filled 2022-09-06: qty 2
  Filled 2022-09-06 (×2): qty 1
  Filled 2022-09-06 (×2): qty 2

## 2022-09-06 MED ORDER — DIGOXIN 125 MCG PO TABS: 0.1250 mg | ORAL_TABLET | Freq: Every day | ORAL | Status: DC

## 2022-09-06 MED ORDER — ONDANSETRON HCL 4 MG/2ML IJ SOLN
INTRAMUSCULAR | Status: DC | PRN
  Administered 2022-09-06: 4 mg via INTRAVENOUS

## 2022-09-06 MED ORDER — FENTANYL CITRATE PF 50 MCG/ML IJ SOSY: 25.0000 ug | PREFILLED_SYRINGE | INTRAMUSCULAR | Status: DC | PRN

## 2022-09-06 MED ORDER — INSULIN ASPART 100 UNIT/ML IJ SOLN
0.0000 [IU] | INTRAMUSCULAR | Status: DC
  Administered 2022-09-06: 3 [IU] via SUBCUTANEOUS
  Administered 2022-09-08: 5 [IU] via SUBCUTANEOUS
  Administered 2022-09-08 (×3): 3 [IU] via SUBCUTANEOUS
  Administered 2022-09-08 – 2022-09-09 (×3): 2 [IU] via SUBCUTANEOUS
  Administered 2022-09-09 – 2022-09-10 (×7): 3 [IU] via SUBCUTANEOUS
  Administered 2022-09-10: 2 [IU] via SUBCUTANEOUS
  Administered 2022-09-10 (×2): 3 [IU] via SUBCUTANEOUS
  Administered 2022-09-10: 2 [IU] via SUBCUTANEOUS
  Administered 2022-09-11 (×3): 5 [IU] via SUBCUTANEOUS
  Administered 2022-09-11: 3 [IU] via SUBCUTANEOUS
  Administered 2022-09-11: 2 [IU] via SUBCUTANEOUS
  Administered 2022-09-11: 3 [IU] via SUBCUTANEOUS
  Administered 2022-09-12: 2 [IU] via SUBCUTANEOUS
  Administered 2022-09-12: 5 [IU] via SUBCUTANEOUS
  Administered 2022-09-12 (×4): 3 [IU] via SUBCUTANEOUS
  Administered 2022-09-13: 5 [IU] via SUBCUTANEOUS
  Administered 2022-09-13: 3 [IU] via SUBCUTANEOUS
  Administered 2022-09-13: 2 [IU] via SUBCUTANEOUS
  Administered 2022-09-13 – 2022-09-14 (×3): 3 [IU] via SUBCUTANEOUS
  Administered 2022-09-14: 5 [IU] via SUBCUTANEOUS
  Administered 2022-09-14: 3 [IU] via SUBCUTANEOUS
  Administered 2022-09-14 (×3): 5 [IU] via SUBCUTANEOUS
  Administered 2022-09-14 – 2022-09-15 (×6): 3 [IU] via SUBCUTANEOUS
  Administered 2022-09-15 – 2022-09-16 (×3): 5 [IU] via SUBCUTANEOUS

## 2022-09-06 MED ORDER — CARVEDILOL 3.125 MG PO TABS: 6.2500 mg | ORAL_TABLET | Freq: Two times a day (BID) | ORAL | Status: DC

## 2022-09-06 MED ORDER — POLYETHYLENE GLYCOL 3350 17 G PO PACK
17.0000 g | PACK | Freq: Every day | ORAL | Status: DC
  Administered 2022-09-06 – 2022-09-19 (×7): 17 g
  Filled 2022-09-06 (×7): qty 1

## 2022-09-06 MED ORDER — INSULIN ASPART 100 UNIT/ML IJ SOLN: 0.0000 [IU] | Freq: Three times a day (TID) | INTRAMUSCULAR | Status: DC

## 2022-09-06 MED ORDER — IOHEXOL 300 MG/ML  SOLN
50.0000 mL | Freq: Once | INTRAMUSCULAR | Status: AC | PRN
  Administered 2022-09-06: 6 mL via INTRA_ARTERIAL

## 2022-09-06 MED ORDER — IOHEXOL 300 MG/ML  SOLN
150.0000 mL | Freq: Once | INTRAMUSCULAR | Status: AC | PRN
  Administered 2022-09-06: 150 mL via INTRA_ARTERIAL

## 2022-09-06 NOTE — Progress Notes (Signed)
PT Cancellation Note  Patient Details Name: Vincent [name] MRN: [id number] DOB: February 14, 1936   Cancelled Treatment:    Reason Eval/Treat Not Completed: Patient not medically ready. Pt remains intubated. RN reports plan to attempt extubation today. Acute PT to hold and to return to complete PT eval as able/appropriate.  Kittie Plater, PT, DPT Acute Rehabilitation Services Secure chat preferred Office #: [phone]    Berline Lopes 09/06/2022, 7:54 AM

## 2022-09-06 NOTE — Progress Notes (Signed)
[facility] ADULT ICU REPLACEMENT PROTOCOL FOR AM LAB REPLACEMENT ONLY (AM labs back after rounds so not noticed until this evening)  The patient does apply for the [facility] Adult ICU Electrolyte Replacment Protocol based on the criteria listed below:   1. Is GFR >/= 40 ml/min? Yes.    Patient's GFR today is 65 2. Is urine output >/= 0.5 ml/kg/hr for the last 6 hours? Yes.   Patient's UOP is 0.9 ml/kg/hr 3. Is BUN < 60 mg/dL? Yes.    Patient's BUN today is 6 4. Abnormal electrolyte(s): K 3.2, Mg 1.8. Noted pt also on digoxin 5. Ordered repletion with: Magnesium sulfate 2gm IV now and potassium chloride 43mq per tube   CSherlon Handing PharmD, BCPS Please see amion for complete clinical pharmacist phone list1/04/2023 7:01 PM

## 2022-09-06 NOTE — Progress Notes (Addendum)
CCM NP Nevada Crane was notified that urine from Foley catheter was pink.  Lab orders were placed.  RN will assess and act accordingly.   Hema Carlean Jews was also pointed out to MD Agarwala while he was present in the patient's room.

## 2022-09-06 NOTE — Progress Notes (Signed)
[place] Progress Note Patient Name: Vincent [name] DOB: 10/12/1935 MRN: [id number]   Date of Service  09/06/2022  HPI/Events of Note  Received request for order of Cleviprex that the patient came with. 62 M hx of afib on apixaban, HFrEF 25-30%, CAD s/p stent, DM (A1C 6.5), CVA presented after being found down. CTA with distal basilar artery occlusion underwent revascularization c/o IR. Remains intubated.  eICU Interventions  Cleviprex ordered with SBP goal 120-140 as per neurology     Intervention Category Intermediate Interventions: Hypertension - evaluation and management  Vincent [name] Vincent [name] 09/06/2022, 2:19 AM

## 2022-09-06 NOTE — Progress Notes (Signed)
Patient transported on ventilator to MRI and back to 8V29 with no complications. Vitals remained stable.

## 2022-09-06 NOTE — Progress Notes (Signed)
eLink Physician-Brief Progress Note Patient Name: Vincent [name] DOB: 09/28/1935 MRN: [id number]   Date of Service  09/06/2022  HPI/Events of Note  Notified that SBP still not at goal despite maximum Cleviprex and propofol Patient seen adequately sedated Noted diastolic pressure seems to be on the lower side with BP 145/49  eICU Interventions  Change Cleviprex parameter with goal SBP < 150 so as not to drop diastolic pressure further in this 87 year old     Intervention Category Intermediate Interventions: Hypertension - evaluation and management  Shona Needles Sheyann Sulton 09/06/2022, 3:02 AM

## 2022-09-06 NOTE — Transfer of Care (Signed)
Immediate Anesthesia Transfer of Care Note  Patient: Vincent [name]  Procedure(s) Performed: RADIOLOGY WITH ANESTHESIA  Patient Location: ICU  Anesthesia Type:General  Level of Consciousness: Patient remains intubated per anesthesia plan  Airway & Oxygen Therapy: Patient remains intubated per anesthesia plan and Patient placed on Ventilator (see vital sign flow sheet for setting)  Post-op Assessment: Report given to RN and Post -op Vital signs reviewed and stable  Post vital signs: Reviewed and stable  Last Vitals:  Vitals Value Taken Time  BP 142/47 09/06/22 0129  Temp 35.6 C 09/06/22 0129  Pulse 65 09/06/22 0129  Resp 18 09/06/22 0129  SpO2 100% 09/06/22 0129  Vitals shown include unvalidated device data.  Last Pain:  Vitals:   09/06/22 0127  TempSrc: Axillary  PainSc:          Complications: No notable events documented.

## 2022-09-06 NOTE — Progress Notes (Signed)
OT Cancellation Note  Patient Details Name: Vincent [name] MRN: [id number] DOB: [birth year]   Cancelled Treatment:    Reason Eval/Treat Not Completed: Patient not medically ready.Pt remains intubated. RN reports plan to attempt extubation today.   [name] 09/06/2022, 8:17 AM

## 2022-09-06 NOTE — Code Documentation (Signed)
Stroke Response Nurse Documentation Code Documentation  Vincent [name] is a 87 y.o. male arriving to [facility]  via [facility] on 09/25/2022 with past medical hx of afib,HTN, CAD, HLD, CVA, CHF. On Eliquis (apixaban) daily. Code stroke was activated by ED.   Patient from home where he was LKW at 1300 and now complaining of left sided weakness and slurred speech.   Stroke team at the bedside on patient arrival. Labs drawn and patient cleared for CT by Dr. Mammie Lorenzo. Patient to CT with team. NIHSS 14, see documentation for details and code stroke times. Patient with decreased LOC, right gaze preference , left hemianopia, left facial droop, left arm weakness, bilateral leg weakness, and dysarthria  on exam. The following imaging was completed:  CT Head and CTA. Patient is not a candidate for IV Thrombolytic due to Eliquis and outside the window. Patient is a candidate for IR due to basilar artery occlusion.   Care Plan: Thrombectomy.   Bedside handoff with IR RN Elta Guadeloupe.     Madelynn Done  Rapid Response RN

## 2022-09-06 NOTE — Progress Notes (Signed)
Heart Failure Navigator Progress Note  Assessed for Heart & Vascular TOC clinic readiness.  Patient does not meet criteria due to Advanced Heart Failure Team patient of Dr. McLean.   Navigator will sign off at this time.   Weltha Cathy, BSN, RN Heart Failure Nurse Navigator Secure Chat Only   

## 2022-09-06 NOTE — Progress Notes (Signed)
SBAR called to [place], Therapist, sports. All questions answered to satisfaction. Pt transported to 4N28 on bed with CRNA.

## 2022-09-06 NOTE — Progress Notes (Signed)
Referring Physician(s):  Dr Bruce Donath  Supervising Physician: Luanne Bras  Patient Status:  [facility] - In-pt  Chief Complaint:  CVA Basilar artery occlusion  Subjective:  NIR procedure 1/9 early am Status post bilateral vertebral artery angiograms, and right common carotid arteriogram.  Right CFA and right radial approach. Findings.  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 Status post complete revascularization of distal basilar artery with 1 pass with a 4 mm x 40 mm Solitaire X retriever device, and  aspiration achieving aTICI 2C revascularization. Post CT of brain no evidence of hemorrhage.  Wristband applied for hemostasis at the right radial puncture site.  Distal right radial pulse verified to be present post procedure .   8 French Angio-Seal closure device to deployed for hemostasis of the right groin puncture site.  Distal pedal pulses not dopplerable bilaterally unchanged from prior to the procedure.  Pt is sedated; intubated; no response   Allergies: Patient has no known allergies.  Medications: Prior to Admission medications   Medication Sig Start Date End Date Taking? Authorizing Provider  apixaban (ELIQUIS) 5 MG TABS tablet Take 1 tablet (5 mg total) by mouth 2 (two) times daily. 05/16/22  Yes Larey Dresser, MD  carvedilol (COREG) 6.25 MG tablet Take 1 tablet (6.25 mg total) by mouth 2 (two) times daily with a meal. 12/07/21  Yes Larey Dresser, MD  acetaminophen (TYLENOL) 500 MG tablet Take 2 tablets (1,000 mg total) by mouth every 6 (six) hours as needed. Patient taking differently: Take 1,000 mg by mouth every 6 (six) hours as needed for moderate pain or mild pain. 02/21/21   [name], MD  atorvastatin (LIPITOR) 80 MG tablet Take 1 tablet (80 mg total) by mouth daily. 06/29/22   Milford, Maricela Bo, FNP  Brimonidine Tartrate (ALPHAGAN P OP) Place 1 drop into both eyes in the morning and at bedtime.    [provider]  Calcium  Carbonate-Vitamin D (OSCAL 500 D-3 PO) Take 1 tablet by mouth 2 (two) times daily.    [provider]  Cyanocobalamin (VITAMIN B-12 CR PO) Take 1 tablet by mouth daily.    [provider]  digoxin (LANOXIN) 0.125 MG tablet Take 1 tablet (0.125 mg total) by mouth daily. 06/29/22   Milford, Maricela Bo, FNP  empagliflozin (JARDIANCE) 25 MG TABS tablet Take 25 mg by mouth daily.    [provider]  famotidine (PEPCID) 20 MG tablet Take 20 mg by mouth daily.    [provider]  Ferrous Sulfate (IRON PO) Take 1 tablet by mouth daily.    [provider]  furosemide (LASIX) 40 MG tablet Take 40 mg by mouth daily.    [provider]  isosorbide mononitrate (IMDUR) 30 MG 24 hr tablet Take 1 tablet (30 mg total) by mouth daily. 12/17/21   Larey Dresser, MD  loratadine (CLARITIN) 10 MG tablet Take 10 mg by mouth daily.    [provider]  LUMIGAN 0.01 % SOLN Place 1 drop into both eyes at bedtime. 06/05/22   [provider]  Magnesium 400 MG TABS Take 400 mg by mouth 2 (two) times daily.    [provider]  metFORMIN (GLUCOPHAGE) 500 MG tablet Take 500 mg by mouth 2 (two) times daily. 06/16/19   [provider]  nitroGLYCERIN (NITROLINGUAL) 0.4 MG/SPRAY spray Place 1 spray under the tongue every 5 (five) minutes x 3 doses as needed for chest pain. 06/29/22   Allena Katz  M, FNP  Omega-3 Fatty Acids (OMEGA-3 FISH OIL PO) Take 1 capsule by mouth daily.    [provider]  oxyCODONE (OXY IR/ROXICODONE) 5 MG immediate release tablet Take 1 tablet (5 mg total) by mouth every 6 (six) hours as needed for severe pain. 07/27/22   Arrien, Jimmy Picket, MD  sacubitril-valsartan (ENTRESTO) 97-103 MG Take 1 tablet by mouth 2 (two) times daily. 12/07/21   Larey Dresser, MD  spironolactone (ALDACTONE) 25 MG tablet Take 25 mg by mouth daily.    [provider]     Vital Signs: BP (!) 114/49   Pulse 78   Temp  99.4 F (37.4 C) (Oral)   Resp (!) 21   Ht '5\' 9"'$  (1.753 m)   Wt 166 lb 14.2 oz (75.7 kg)   SpO2 100%   BMI 24.65 kg/m   Physical Exam Constitutional:      Comments: Intubated; no response   Musculoskeletal:     Comments: No movement Sedates; intubated  Skin:    General: Skin is warm.     Comments: Rt groin no bleeding; soft; no hematoma Rt foot 1+pulses Rt wrist site- no bleeding and no hematoma; good 2+ pulse     Imaging: DG Abd 1 View  Result Date: 09/06/2022 CLINICAL DATA:  Orogastric tube placement. EXAM: ABDOMEN - 1 VIEW COMPARISON:  None Available. FINDINGS: Distal tip of nasogastric tube is seen in expected position of the stomach. IMPRESSION: Distal tip of nasogastric tube is seen in expected position of the stomach. Electronically Signed   By: Marijo Conception M.D.   On: 09/06/2022 08:12   CT Angio Head W or Wo Contrast  Result Date: 09/03/2022 CLINICAL DATA:  Dizziness and vomiting. Right-sided gaze preference. Stroke workup. EXAM: CT ANGIOGRAPHY HEAD TECHNIQUE: Multidetector CT imaging of the head was performed using the standard protocol during bolus administration of intravenous contrast. Multiplanar CT image reconstructions and MIPs were obtained to evaluate the vascular anatomy. RADIATION DOSE REDUCTION: This exam was performed according to the departmental dose-optimization program which includes automated exposure control, adjustment of the mA and/or kV according to patient size and/or use of iterative reconstruction technique. CONTRAST:  1m OMNIPAQUE IOHEXOL 350 MG/ML SOLN COMPARISON:  Same-day noncontrast CT head. FINDINGS: CTA HEAD Anterior circulation: The imaged portions of the ICAs in the neck are patent. There is calcified plaque in the carotid siphons resulting in moderate to severe stenosis bilaterally. The right M1 segment is patent. There is multifocal high-grade stenosis of the right M2 and M3 branches corresponding to the chronic MCA distribution infarct  (7-96, 7-105, 7-117). The ACAs are patent, without proximal high-grade stenosis or occlusion. The anterior communicating artery is normal. There is no aneurysm or AVM. The left M1 segment is patent. The distal left MCA branches appear overall patent, without proximal high-grade stenosis or occlusion. Posterior circulation: The left V4 segment is occluded there is calcified plaque in the proximal V4 segment resulting in moderate to severe stenosis. There is poor opacification of the basilar artery throughout with the apparent occlusion distally. The bilateral PCAs are supplied by posterior communicating arteries with likely retrograde flow into the P1 segments. There is no proximal high-grade stenosis in the PCAs. There is no aneurysm or AVM. Venous sinuses: Suboptimally evaluated due to bolus timing. Anatomic variants: As above. Review of the MIP images confirms the above findings. IMPRESSION: 1. 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 The  bilateral PCAs are supplied by posterior communicating arteries with likely retrograde flow into the P1 segments. 2. Multifocal high-grade stenosis of the right M2 and M3 branches relatively diminished perfusion corresponding to the chronic MCA distribution infarct. 3. Moderate to severe stenosis in the bilateral carotid siphons. These results were called by telephone at the time of interpretation on 09/13/2022 at 7:40 pm to provider Dr Cheral Marker, who verbally acknowledged these results. Electronically Signed   By: Valetta Mole M.D.   On: 08/29/2022 19:41   US Abdomen Limited RUQ (LIVER/GB)  Result Date: 09/16/2022 CLINICAL DATA:  Vomiting EXAM: ULTRASOUND ABDOMEN LIMITED RIGHT UPPER QUADRANT COMPARISON:  CT 09/14/2022 earlier.  Older exams as well. FINDINGS: Gallbladder: Gallbladder is mildly distended with dependent stones. No wall thickening. There is some trace ascites. Common bile duct:  Diameter: 3 mm Liver: No focal lesion identified. Within normal limits in parenchymal echogenicity. Portal vein is patent on color Doppler imaging with normal direction of blood flow towards the liver. Other: Trace free fluid. IMPRESSION: Distended gallbladder with stones. No ductal dilatation. No further sonographic evidence of acute cholecystitis at this time. Electronically Signed   By: Jill Side M.D.   On: 09/09/2022 18:30   CT Head Wo Contrast  Result Date: 08/29/2022 CLINICAL DATA:  Neck trauma (Age >= 65y); Head trauma, minor (Age >= 65y) EXAM: CT HEAD WITHOUT CONTRAST CT CERVICAL SPINE WITHOUT CONTRAST TECHNIQUE: Multidetector CT imaging of the head and cervical spine was performed following the standard protocol without intravenous contrast. Multiplanar CT image reconstructions of the cervical spine were also generated. RADIATION DOSE REDUCTION: This exam was performed according to the departmental dose-optimization program which includes automated exposure control, adjustment of the mA and/or kV according to patient size and/or use of iterative reconstruction technique. COMPARISON:  CT head and C-spine 07/20/2022 FINDINGS: CT HEAD FINDINGS Brain: Cerebral ventricle sizes are concordant with the degree of cerebral volume loss. 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 Chronic bilateral cerebellar infarctions. Patchy and confluent areas of decreased attenuation are noted throughout the deep and periventricular white matter of the cerebral hemispheres bilaterally, compatible with chronic microvascular ischemic disease. No evidence of large-territorial acute infarction. No parenchymal hemorrhage. No mass lesion. No extra-axial collection. No mass effect or midline shift. No hydrocephalus. Basilar cisterns are patent. Vascular: No hyperdense vessel. Skull: No acute fracture or focal lesion. Sinuses/Orbits: Paranasal sinuses and mastoid air cells are  clear. Bilateral lens replacement. Otherwise the orbits are unremarkable. Other: None. CT CERVICAL SPINE FINDINGS Alignment: Normal. Skull base and vertebrae: Multilevel moderate severe degenerative changes of the spine with associated posterior disc osteophyte complex formation at the C3-C4 level and C4-C5 level. Possible smaller posterior disc osteophyte complex formation at the C6-C7 level. No associated severe osseous neural foraminal or central canal stenosis. No acute fracture. No aggressive appearing focal osseous lesion or focal pathologic process. Soft tissues and spinal canal: No prevertebral fluid or swelling. No visible canal hematoma. Upper chest: Unremarkable. Other: None. IMPRESSION: 1. No acute intracranial abnormality. 2. No acute displaced fracture or traumatic listhesis of the cervical spine. Electronically Signed   By: Iven Finn M.D.   On: 09/02/2022 17:32   CT Cervical Spine Wo Contrast  Result Date: 09/15/2022 CLINICAL DATA:  Neck trauma (Age >= 65y); Head trauma, minor (Age >= 65y) EXAM: CT HEAD WITHOUT CONTRAST CT CERVICAL SPINE WITHOUT CONTRAST TECHNIQUE: Multidetector CT imaging of the head and cervical spine was performed following the standard protocol without intravenous contrast. Multiplanar CT  image reconstructions of the cervical spine were also generated. RADIATION DOSE REDUCTION: This exam was performed according to the departmental dose-optimization program which includes automated exposure control, adjustment of the mA and/or kV according to patient size and/or use of iterative reconstruction technique. COMPARISON:  CT head and C-spine 07/20/2022 FINDINGS: CT HEAD FINDINGS Brain: Cerebral ventricle sizes are concordant with the degree of cerebral volume loss. 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 Chronic bilateral cerebellar infarctions. Patchy and confluent areas of decreased attenuation are noted throughout the  deep and periventricular white matter of the cerebral hemispheres bilaterally, compatible with chronic microvascular ischemic disease. No evidence of large-territorial acute infarction. No parenchymal hemorrhage. No mass lesion. No extra-axial collection. No mass effect or midline shift. No hydrocephalus. Basilar cisterns are patent. Vascular: No hyperdense vessel. Skull: No acute fracture or focal lesion. Sinuses/Orbits: Paranasal sinuses and mastoid air cells are clear. Bilateral lens replacement. Otherwise the orbits are unremarkable. Other: None. CT CERVICAL SPINE FINDINGS Alignment: Normal. Skull base and vertebrae: Multilevel moderate severe degenerative changes of the spine with associated posterior disc osteophyte complex formation at the C3-C4 level and C4-C5 level. Possible smaller posterior disc osteophyte complex formation at the C6-C7 level. No associated severe osseous neural foraminal or central canal stenosis. No acute fracture. No aggressive appearing focal osseous lesion or focal pathologic process. Soft tissues and spinal canal: No prevertebral fluid or swelling. No visible canal hematoma. Upper chest: Unremarkable. Other: None. IMPRESSION: 1. No acute intracranial abnormality. 2. No acute displaced fracture or traumatic listhesis of the cervical spine. Electronically Signed   By: Iven Finn M.D.   On: 09/07/2022 17:32   CT CHEST ABDOMEN PELVIS W CONTRAST  Result Date: 08/31/2022 CLINICAL DATA:  Sepsis.  Fall.  Unknown time down. EXAM: CT CHEST, ABDOMEN, AND PELVIS WITH CONTRAST TECHNIQUE: Multidetector CT imaging of the chest, abdomen and pelvis was performed following the standard protocol during bolus administration of intravenous contrast. RADIATION DOSE REDUCTION: This exam was performed according to the departmental dose-optimization program which includes automated exposure control, adjustment of the mA and/or kV according to patient size and/or use of iterative reconstruction  technique. CONTRAST:  6m OMNIPAQUE IOHEXOL 350 MG/ML SOLN COMPARISON:  X-ray thoracic and lumbar spine 03/31/2020 FINDINGS: CHEST: Cardiovascular: No aortic injury. The thoracic aorta is normal in caliber. The heart is enlarged in size. No significant pericardial effusion. Atherosclerotic plaque. Four-vessel coronary calcification. Mediastinum/Nodes: No pneumomediastinum. No mediastinal hematoma. The esophagus is unremarkable. The thyroid is unremarkable. The central airways are patent. No mediastinal, hilar, or axillary lymphadenopathy. Lungs/Pleura: Peribronchovascular ground-glass airspace opacities of the lingula and bilateral lower lobes. No pulmonary nodule. No pulmonary mass. No pulmonary contusion or laceration. No pneumatocele formation. Trace right pleural effusion. Possible tiny trace left pleural effusion. No pneumothorax. No hemothorax. Musculoskeletal/Chest wall: No chest wall mass. No acute rib or sternal fracture. Old healed sternal fracture. Old healed left rib fractures. No spinal fracture. Mild to moderate degenerative changes of the shoulders, right greater than left. ABDOMEN / PELVIS: Hepatobiliary: Not enlarged. Heterogeneous appearance of the hepatic parenchyma likely due to streak artifact originating from the bilateral upper extremities along the patient's sides. No focal lesion. No laceration or subcapsular hematoma. Calcified gallstones within the gallbladder lumen. Suggestion of fundal gallbladder wall thickening/pericholecystic fluid. No biliary ductal dilatation. Pancreas: Normal pancreatic contour. No main pancreatic duct dilatation. Spleen: Not enlarged. No focal lesion. No laceration, subcapsular hematoma, or vascular injury. Adrenals/Urinary Tract: No nodularity bilaterally. Bilateral kidneys enhance symmetrically. No hydronephrosis.  No contusion, laceration, or subcapsular hematoma. Fluid dense lesion of the right kidney likely represents a simple renal cyst. Simple renal cysts,  in the absence of clinically indicated signs/symptoms, require no independent follow-up. No injury to the vascular structures or collecting systems. No hydroureter. The urinary bladder is unremarkable. On delayed imaging, there is no urothelial wall thickening and there are no filling defects in the opacified portions of the bilateral collecting systems or ureters. Stomach/Bowel: No small or large bowel wall thickening or dilatation. Colonic diverticulosis. The appendix is unremarkable. Vasculature/Lymphatics: Severe atherosclerotic plaque. No abdominal aorta or iliac aneurysm. No active contrast extravasation or pseudoaneurysm. No abdominal, pelvic, inguinal lymphadenopathy. Reproductive: Prostate is borderline enlarged in size. Partially visualized at least small to moderate volume right hydrocele. Other: No simple free fluid ascites. No pneumoperitoneum. No hemoperitoneum. No mesenteric hematoma identified. No organized fluid collection. Musculoskeletal: No significant soft tissue hematoma. No acute pelvic fracture. No acute spinal fracture. Chronic compression fracture of the L1 and L4 vertebral bodies grossly stable in appearance. Grade 1 anterolisthesis of L3 on L4 and L4 on L5. Multilevel degenerative changes of the spine. Ports and Devices: None. IMPRESSION: 1. Peribronchovascular ground-glass airspace opacities of the lingula and bilateral lower lobes. Findings suggestive of infection/inflammation and consistent with chest x-ray. Followup PA and lateral chest X-ray is recommended in 3-4 weeks following therapy to ensure resolution. 2. Trace right pleural effusion. Possible tiny trace left pleural effusion. 3. Cholelithiasis with suggestion of fundal gallbladder wall thickening/pericholecystic fluid. Correlate with liver function tests. Consider right upper quadrant ultrasound for a more sensitive evaluation of the gallbladder if clinically indicated. 4. No acute traumatic injury to the chest, abdomen, or  pelvis. 5. No acute fracture or traumatic malalignment of the thoracic or lumbar spine. 6. Other imaging findings of potential clinical significance: Colonic diverticulosis with no acute diverticulitis. Cardiomegaly. Borderline prostatomegaly. Partially visualized at least small to moderate volume right hydrocele .Aortic Atherosclerosis (ICD10-I70.0) -including four-vessel coronary artery calcification. Electronically Signed   By: Iven Finn M.D.   On: 08/29/2022 17:26   DG Chest Port 1 View  Result Date: 09/06/2022 CLINICAL DATA:  Shortness of breath and weakness. EXAM: PORTABLE CHEST 1 VIEW COMPARISON:  07/20/2022 FINDINGS: Patient is slightly rotated to the right. Lungs are adequately inflated demonstrate subtle hazy prominence of the pulmonary vasculature as well as minimal bibasilar density. Findings likely due to mild interstitial edema/vascular congestion. Not exclude infection in the lung bases. Stable cardiomegaly. Metallic device projects over the anterior left chest wall unchanged. Remainder of the exam is unchanged. IMPRESSION: 1. Findings suggesting mild interstitial edema/vascular congestion. Infection in the lung bases is possible. 2. Stable cardiomegaly. Electronically Signed   By: Marin Olp M.D.   On: 09/13/2022 15:35    Labs:  CBC: Recent Labs    07/21/22 0053 07/21/22 0830 07/22/22 0019 07/24/22 0908 09/02/2022 1520 09/14/2022 1547  WBC 7.7  --  4.8 4.6 3.8*  --   HGB 11.8*   < > 10.0* 10.0* 11.1* 12.2*  12.2*  HCT 37.9*   < > 32.1* 31.2* 36.7* 36.0*  36.0*  PLT 159  --  152 167 149*  --    < > = values in this interval not displayed.    COAGS: Recent Labs    09/06/21 1327 09/17/21 1317 09/18/21 0147 06/10/22 1547  INR 3.9* 1.5* 1.4* 1.3*    BMP: Recent Labs    07/24/22 0908 07/25/22 0331 07/27/22 0504 09/26/2022 1520 09/27/2022 1547  NA 136 139 135  140 143  144  K 3.3* 3.9 4.0 3.3* 3.7  3.7  CL 98 104 101 106 103  CO2 '28 28 25 26  '$ --   GLUCOSE  141* 97 105* 152* 153*  BUN '12 16 18 8 8  '$ CALCIUM 8.2* 8.2* 8.3* 8.8*  --   CREATININE 0.85 1.05 0.90 1.09 1.00  GFRNONAA >60 >60 >60 >60  --     LIVER FUNCTION TESTS: Recent Labs    09/18/21 0147 07/23/22 0259 09/28/2022 1520  BILITOT 2.5* 2.2* 1.8*  AST 42* 20 64*  ALT 33 19 48*  ALKPHOS 86 67 144*  PROT 6.6 6.1* 6.9  ALBUMIN 3.5 2.8* 3.3*    Assessment and Plan:  Post basilar artery revascularization in NIR early this am Intubated and sedated post procedure Dr Estanislado Pandy has seen and examined pt Await MRI/MRA Will follow with Neuro  Electronically Signed: Lavonia Drafts, PA-C 09/06/2022, 10:03 AM   I spent a total of 15 Minutes at the the patient's bedside AND on the patient's hospital floor or unit, greater than 50% of which was counseling/coordinating care for Basilar artery revascularization

## 2022-09-06 NOTE — Procedures (Signed)
INR.  Status post bilateral vertebral artery angiograms, and right common carotid arteriogram.  Right CFA and right radial approach. Findings.  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 Status post complete revascularization of distal basilar artery with 1 pass with a 4 mm x 40 mm Solitaire X retriever device, and  aspiration achieving aTICI 2C revascularization. Post CT of brain no evidence of hemorrhage.  Wristband applied for hemostasis at the right radial puncture site.  Distal right radial pulse verified to be present post procedure .  8 French Angio-Seal closure device to deployed for hemostasis of the right groin puncture site.  Distal pedal pulses not dopplerable bilaterally unchanged from prior to the procedure. Patient left intubated due to his medical condition. Arlean Hopping MD

## 2022-09-06 NOTE — Progress Notes (Signed)
ANTICOAGULATION CONSULT NOTE  Pharmacy Consult for heparin Indication: VTE treatment - Eliquis PTA  No Known Allergies  Patient Measurements: Height: '5\' 9"'$  (175.3 cm) Weight: 75.7 kg (166 lb 14.2 oz) IBW/kg (Calculated) : 70.7 Heparin Dosing Weight: TBW  Vital Signs: Temp: 98.5 F (36.9 C) (01/09 1200) Temp Source: Axillary (01/09 1200) BP: 126/66 (01/09 1400) Pulse Rate: 78 (01/09 1400)  Labs: Recent Labs    08/30/2022 1520 09/16/2022 1547 08/30/2022 1742 09/06/22 0926  HGB 11.1* 12.2*  12.2*  --  9.5*  HCT 36.7* 36.0*  36.0*  --  30.3*  PLT 149*  --   --  140*  APTT  --   --   --  176*  HEPARINUNFRC  --   --   --  0.58  CREATININE 1.09 1.00  --  0.84  CKTOTAL 59  --   --   --   TROPONINIHS 60*  --  58*  --      Estimated Creatinine Clearance: 65.5 mL/min (by C-G formula based on SCr of 0.84 mg/dL).   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 s/p remote stent to Cx 2004 with chronic stable angina in context of residual circumflex disease.   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 60 tablet 5 Unk   carvedilol (COREG) 6.25 MG tablet Take 1 tablet (6.25 mg total) by mouth 2 (two) times daily with a meal. 60 tablet 11 Unk   acetaminophen (TYLENOL) 500 MG tablet Take 2 tablets (1,000 mg total) by mouth every 6 (six) hours as needed. (Patient taking differently: Take 1,000 mg by mouth every 6 (six) hours as needed for moderate pain or mild pain.) 30 tablet 0    atorvastatin (LIPITOR) 80 MG tablet Take 1 tablet (80 mg total) by mouth daily. 30 tablet 11    Brimonidine Tartrate (ALPHAGAN P OP) Place 1 drop into both eyes in the morning and at bedtime.      Calcium Carbonate-Vitamin D (OSCAL 500 D-3 PO) Take 1 tablet by mouth 2 (two) times daily.      Cyanocobalamin (VITAMIN B-12 CR PO) Take 1 tablet by mouth daily.      digoxin (LANOXIN) 0.125 MG tablet Take 1 tablet (0.125 mg total) by mouth daily. 90 tablet 3    empagliflozin (JARDIANCE) 25 MG TABS tablet Take 25 mg by mouth daily.      famotidine (PEPCID) 20 MG tablet Take 20 mg by mouth daily.  Ferrous Sulfate (IRON PO) Take 1 tablet by mouth daily.      furosemide (LASIX) 40 MG tablet Take 40 mg by mouth daily.      isosorbide mononitrate (IMDUR) 30 MG 24 hr tablet Take 1 tablet (30 mg total) by mouth daily. 90 tablet 3    loratadine (CLARITIN) 10 MG tablet Take 10 mg by mouth daily.      LUMIGAN 0.01 % SOLN Place 1 drop into both eyes at bedtime.      Magnesium 400 MG TABS Take 400 mg by mouth 2 (two) times daily.      metFORMIN (GLUCOPHAGE) 500 MG tablet Take 500 mg by mouth 2 (two) times daily.      nitroGLYCERIN (NITROLINGUAL) 0.4 MG/SPRAY spray Place 1 spray under the tongue every 5 (five) minutes x 3 doses as needed for chest pain. 12 g 6    Omega-3 Fatty Acids (OMEGA-3 FISH OIL PO) Take 1 capsule by mouth daily.      oxyCODONE (OXY IR/ROXICODONE) 5 MG immediate release tablet Take 1 tablet (5 mg total) by mouth every 6 (six) hours as needed for severe pain. 10 tablet 0     sacubitril-valsartan (ENTRESTO) 97-103 MG Take 1 tablet by mouth 2 (two) times daily. 60 tablet 11    spironolactone (ALDACTONE) 25 MG tablet Take 25 mg by mouth daily.       Assessment:  4 yoM with stroke presenting after being found down at home by family. Patient's records report apixaban prior to admission (unknown last dose). Patient is now s/p IR thrombectomy (1/9).  Initial anti-Xa level 0.58 (not consistent with a recent apixiban dose), aPTT repeated to verify since it was supratherapeutic however Anti-Xa level was not, confirmed result with lab so will follow aPTT, RN noted urine with pink color, chronic anemia stable but will have to monitor  Goal of Therapy:  Heparin level 0.3-0.5 units/ml aPTT 66-85 seconds Monitor platelets by anticoagulation protocol: Yes   Plan:  Hold heparin gtt x 1h, restart at reduced rate of 800 units/hr F/u 8 hour aPTT/HL Monitor CBC, s/s hematuria/bleeding  Bertis Ruddy, PharmD, [place] Pharmacist ED Pharmacist Phone # [phone] 09/06/2022 3:26 PM

## 2022-09-06 NOTE — Progress Notes (Addendum)
STROKE TEAM PROGRESS NOTE   INTERVAL HISTORY His sister is at the bedside.  Currently sedated in anticipation of MRI in next 15 minutes. RN had sedation off for about an hour and was unable to get him to follow commands.  Vitals:   09/06/22 0700 09/06/22 0715 09/06/22 0730 09/06/22 0800  BP: (!) 113/50   (!) 114/49  Pulse: 79 80 82 70  Resp: '17 17 17 17  '$ Temp:      TempSrc:      SpO2: 100% 100% 100% 100%  Weight:      Height:       CBC:  Recent Labs  Lab 09/18/2022 1520 09/23/2022 1547  WBC 3.8*  --   NEUTROABS 2.9  --   HGB 11.1* 12.2*  12.2*  HCT 36.7* 36.0*  36.0*  MCV 88.2  --   PLT 149*  --    Basic Metabolic Panel:  Recent Labs  Lab 09/08/2022 1520 09/24/2022 1547  NA 140 143  144  K 3.3* 3.7  3.7  CL 106 103  CO2 26  --   GLUCOSE 152* 153*  BUN 8 8  CREATININE 1.09 1.00  CALCIUM 8.8*  --    Lipid Panel:  Recent Labs  Lab 09/06/22 0211  CHOL 129  TRIG 75  HDL 58  CHOLHDL 2.2  VLDL 15  LDLCALC 56   HgbA1c:  Recent Labs  Lab 09/06/22 0211  HGBA1C 6.5*   Urine Drug Screen: No results for input(s): "LABOPIA", "COCAINSCRNUR", "LABBENZ", "AMPHETMU", "THCU", "LABBARB" in the last 168 hours.  Alcohol Level No results for input(s): "ETH" in the last 168 hours.  IMAGING past 24 hours DG Abd 1 View  Result Date: 09/06/2022 CLINICAL DATA:  Orogastric tube placement. EXAM: ABDOMEN - 1 VIEW COMPARISON:  None Available. FINDINGS: Distal tip of nasogastric tube is seen in expected position of the stomach. IMPRESSION: Distal tip of nasogastric tube is seen in expected position of the stomach. Electronically Signed   By: Marijo Conception M.D.   On: 09/06/2022 08:12   CT Angio Head W or Wo Contrast  Result Date: 08/31/2022 CLINICAL DATA:  Dizziness and vomiting. Right-sided gaze preference. Stroke workup. EXAM: CT ANGIOGRAPHY HEAD TECHNIQUE: Multidetector CT imaging of the head was performed using the standard protocol during bolus administration of intravenous  contrast. Multiplanar CT image reconstructions and MIPs were obtained to evaluate the vascular anatomy. RADIATION DOSE REDUCTION: This exam was performed according to the departmental dose-optimization program which includes automated exposure control, adjustment of the mA and/or kV according to patient size and/or use of iterative reconstruction technique. CONTRAST:  82m OMNIPAQUE IOHEXOL 350 MG/ML SOLN COMPARISON:  Same-day noncontrast CT head. FINDINGS: CTA HEAD Anterior circulation: The imaged portions of the ICAs in the neck are patent. There is calcified plaque in the carotid siphons resulting in moderate to severe stenosis bilaterally. The right M1 segment is patent. There is multifocal high-grade stenosis of the right M2 and M3 branches corresponding to the chronic MCA distribution infarct (7-96, 7-105, 7-117). The ACAs are patent, without proximal high-grade stenosis or occlusion. The anterior communicating artery is normal. There is no aneurysm or AVM. The left M1 segment is patent. The distal left MCA branches appear overall patent, without proximal high-grade stenosis or occlusion. Posterior circulation: The left V4 segment is occluded there is calcified plaque in the proximal V4 segment resulting in moderate to severe stenosis. There is poor opacification of the basilar artery throughout with the apparent occlusion distally. The  bilateral PCAs are supplied by posterior communicating arteries with likely retrograde flow into the P1 segments. There is no proximal high-grade stenosis in the PCAs. There is no aneurysm or AVM. Venous sinuses: Suboptimally evaluated due to bolus timing. Anatomic variants: As above. Review of the MIP images confirms the above findings. IMPRESSION: 1. 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 The bilateral PCAs are supplied by posterior communicating arteries with likely retrograde flow  into the P1 segments. 2. Multifocal high-grade stenosis of the right M2 and M3 branches relatively diminished perfusion corresponding to the chronic MCA distribution infarct. 3. Moderate to severe stenosis in the bilateral carotid siphons. These results were called by telephone at the time of interpretation on 09/09/2022 at 7:40 pm to provider Dr Cheral Marker, who verbally acknowledged these results. Electronically Signed   By: Valetta Mole M.D.   On: 09/04/2022 19:41   US Abdomen Limited RUQ (LIVER/GB)  Result Date: 09/20/2022 CLINICAL DATA:  Vomiting EXAM: ULTRASOUND ABDOMEN LIMITED RIGHT UPPER QUADRANT COMPARISON:  CT 08/29/2022 earlier.  Older exams as well. FINDINGS: Gallbladder: Gallbladder is mildly distended with dependent stones. No wall thickening. There is some trace ascites. Common bile duct: Diameter: 3 mm Liver: No focal lesion identified. Within normal limits in parenchymal echogenicity. Portal vein is patent on color Doppler imaging with normal direction of blood flow towards the liver. Other: Trace free fluid. IMPRESSION: Distended gallbladder with stones. No ductal dilatation. No further sonographic evidence of acute cholecystitis at this time. Electronically Signed   By: Jill Side M.D.   On: 09/25/2022 18:30   CT Head Wo Contrast  Result Date: 09/28/2022 CLINICAL DATA:  Neck trauma (Age >= 65y); Head trauma, minor (Age >= 65y) EXAM: CT HEAD WITHOUT CONTRAST CT CERVICAL SPINE WITHOUT CONTRAST TECHNIQUE: Multidetector CT imaging of the head and cervical spine was performed following the standard protocol without intravenous contrast. Multiplanar CT image reconstructions of the cervical spine were also generated. RADIATION DOSE REDUCTION: This exam was performed according to the departmental dose-optimization program which includes automated exposure control, adjustment of the mA and/or kV according to patient size and/or use of iterative reconstruction technique. COMPARISON:  CT head and C-spine  07/20/2022 FINDINGS: CT HEAD FINDINGS Brain: Cerebral ventricle sizes are concordant with the degree of cerebral volume loss. 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 Chronic bilateral cerebellar infarctions. Patchy and confluent areas of decreased attenuation are noted throughout the deep and periventricular white matter of the cerebral hemispheres bilaterally, compatible with chronic microvascular ischemic disease. No evidence of large-territorial acute infarction. No parenchymal hemorrhage. No mass lesion. No extra-axial collection. No mass effect or midline shift. No hydrocephalus. Basilar cisterns are patent. Vascular: No hyperdense vessel. Skull: No acute fracture or focal lesion. Sinuses/Orbits: Paranasal sinuses and mastoid air cells are clear. Bilateral lens replacement. Otherwise the orbits are unremarkable. Other: None. CT CERVICAL SPINE FINDINGS Alignment: Normal. Skull base and vertebrae: Multilevel moderate severe degenerative changes of the spine with associated posterior disc osteophyte complex formation at the C3-C4 level and C4-C5 level. Possible smaller posterior disc osteophyte complex formation at the C6-C7 level. No associated severe osseous neural foraminal or central canal stenosis. No acute fracture. No aggressive appearing focal osseous lesion or focal pathologic process. Soft tissues and spinal canal: No prevertebral fluid or swelling. No visible canal hematoma. Upper chest: Unremarkable. Other: None. IMPRESSION: 1. No acute intracranial abnormality. 2. No acute displaced fracture or traumatic listhesis  of the cervical spine. Electronically Signed   By: Iven Finn M.D.   On: 09/04/2022 17:32   CT Cervical Spine Wo Contrast  Result Date: 09/08/2022 CLINICAL DATA:  Neck trauma (Age >= 65y); Head trauma, minor (Age >= 65y) EXAM: CT HEAD WITHOUT CONTRAST CT CERVICAL SPINE WITHOUT CONTRAST TECHNIQUE: Multidetector CT imaging of the  head and cervical spine was performed following the standard protocol without intravenous contrast. Multiplanar CT image reconstructions of the cervical spine were also generated. RADIATION DOSE REDUCTION: This exam was performed according to the departmental dose-optimization program which includes automated exposure control, adjustment of the mA and/or kV according to patient size and/or use of iterative reconstruction technique. COMPARISON:  CT head and C-spine 07/20/2022 FINDINGS: CT HEAD FINDINGS Brain: Cerebral ventricle sizes are concordant with the degree of cerebral volume loss. 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 Chronic bilateral cerebellar infarctions. Patchy and confluent areas of decreased attenuation are noted throughout the deep and periventricular white matter of the cerebral hemispheres bilaterally, compatible with chronic microvascular ischemic disease. No evidence of large-territorial acute infarction. No parenchymal hemorrhage. No mass lesion. No extra-axial collection. No mass effect or midline shift. No hydrocephalus. Basilar cisterns are patent. Vascular: No hyperdense vessel. Skull: No acute fracture or focal lesion. Sinuses/Orbits: Paranasal sinuses and mastoid air cells are clear. Bilateral lens replacement. Otherwise the orbits are unremarkable. Other: None. CT CERVICAL SPINE FINDINGS Alignment: Normal. Skull base and vertebrae: Multilevel moderate severe degenerative changes of the spine with associated posterior disc osteophyte complex formation at the C3-C4 level and C4-C5 level. Possible smaller posterior disc osteophyte complex formation at the C6-C7 level. No associated severe osseous neural foraminal or central canal stenosis. No acute fracture. No aggressive appearing focal osseous lesion or focal pathologic process. Soft tissues and spinal canal: No prevertebral fluid or swelling. No visible canal hematoma. Upper chest:  Unremarkable. Other: None. IMPRESSION: 1. No acute intracranial abnormality. 2. No acute displaced fracture or traumatic listhesis of the cervical spine. Electronically Signed   By: Iven Finn M.D.   On: 09/08/2022 17:32   CT CHEST ABDOMEN PELVIS W CONTRAST  Result Date: 08/31/2022 CLINICAL DATA:  Sepsis.  Fall.  Unknown time down. EXAM: CT CHEST, ABDOMEN, AND PELVIS WITH CONTRAST TECHNIQUE: Multidetector CT imaging of the chest, abdomen and pelvis was performed following the standard protocol during bolus administration of intravenous contrast. RADIATION DOSE REDUCTION: This exam was performed according to the departmental dose-optimization program which includes automated exposure control, adjustment of the mA and/or kV according to patient size and/or use of iterative reconstruction technique. CONTRAST:  39m OMNIPAQUE IOHEXOL 350 MG/ML SOLN COMPARISON:  X-ray thoracic and lumbar spine 03/31/2020 FINDINGS: CHEST: Cardiovascular: No aortic injury. The thoracic aorta is normal in caliber. The heart is enlarged in size. No significant pericardial effusion. Atherosclerotic plaque. Four-vessel coronary calcification. Mediastinum/Nodes: No pneumomediastinum. No mediastinal hematoma. The esophagus is unremarkable. The thyroid is unremarkable. The central airways are patent. No mediastinal, hilar, or axillary lymphadenopathy. Lungs/Pleura: Peribronchovascular ground-glass airspace opacities of the lingula and bilateral lower lobes. No pulmonary nodule. No pulmonary mass. No pulmonary contusion or laceration. No pneumatocele formation. Trace right pleural effusion. Possible tiny trace left pleural effusion. No pneumothorax. No hemothorax. Musculoskeletal/Chest wall: No chest wall mass. No acute rib or sternal fracture. Old healed sternal fracture. Old healed left rib fractures. No spinal fracture. Mild to moderate degenerative changes of the shoulders, right greater than left. ABDOMEN / PELVIS: Hepatobiliary: Not  enlarged. Heterogeneous appearance of the  hepatic parenchyma likely due to streak artifact originating from the bilateral upper extremities along the patient's sides. No focal lesion. No laceration or subcapsular hematoma. Calcified gallstones within the gallbladder lumen. Suggestion of fundal gallbladder wall thickening/pericholecystic fluid. No biliary ductal dilatation. Pancreas: Normal pancreatic contour. No main pancreatic duct dilatation. Spleen: Not enlarged. No focal lesion. No laceration, subcapsular hematoma, or vascular injury. Adrenals/Urinary Tract: No nodularity bilaterally. Bilateral kidneys enhance symmetrically. No hydronephrosis. No contusion, laceration, or subcapsular hematoma. Fluid dense lesion of the right kidney likely represents a simple renal cyst. Simple renal cysts, in the absence of clinically indicated signs/symptoms, require no independent follow-up. No injury to the vascular structures or collecting systems. No hydroureter. The urinary bladder is unremarkable. On delayed imaging, there is no urothelial wall thickening and there are no filling defects in the opacified portions of the bilateral collecting systems or ureters. Stomach/Bowel: No small or large bowel wall thickening or dilatation. Colonic diverticulosis. The appendix is unremarkable. Vasculature/Lymphatics: Severe atherosclerotic plaque. No abdominal aorta or iliac aneurysm. No active contrast extravasation or pseudoaneurysm. No abdominal, pelvic, inguinal lymphadenopathy. Reproductive: Prostate is borderline enlarged in size. Partially visualized at least small to moderate volume right hydrocele. Other: No simple free fluid ascites. No pneumoperitoneum. No hemoperitoneum. No mesenteric hematoma identified. No organized fluid collection. Musculoskeletal: No significant soft tissue hematoma. No acute pelvic fracture. No acute spinal fracture. Chronic compression fracture of the L1 and L4 vertebral bodies grossly stable in  appearance. Grade 1 anterolisthesis of L3 on L4 and L4 on L5. Multilevel degenerative changes of the spine. Ports and Devices: None. IMPRESSION: 1. Peribronchovascular ground-glass airspace opacities of the lingula and bilateral lower lobes. Findings suggestive of infection/inflammation and consistent with chest x-ray. Followup PA and lateral chest X-ray is recommended in 3-4 weeks following therapy to ensure resolution. 2. Trace right pleural effusion. Possible tiny trace left pleural effusion. 3. Cholelithiasis with suggestion of fundal gallbladder wall thickening/pericholecystic fluid. Correlate with liver function tests. Consider right upper quadrant ultrasound for a more sensitive evaluation of the gallbladder if clinically indicated. 4. No acute traumatic injury to the chest, abdomen, or pelvis. 5. No acute fracture or traumatic malalignment of the thoracic or lumbar spine. 6. Other imaging findings of potential clinical significance: Colonic diverticulosis with no acute diverticulitis. Cardiomegaly. Borderline prostatomegaly. Partially visualized at least small to moderate volume right hydrocele .Aortic Atherosclerosis (ICD10-I70.0) -including four-vessel coronary artery calcification. Electronically Signed   By: Iven Finn M.D.   On: 09/15/2022 17:26   DG Chest Port 1 View  Result Date: 09/15/2022 CLINICAL DATA:  Shortness of breath and weakness. EXAM: PORTABLE CHEST 1 VIEW COMPARISON:  07/20/2022 FINDINGS: Patient is slightly rotated to the right. Lungs are adequately inflated demonstrate subtle hazy prominence of the pulmonary vasculature as well as minimal bibasilar density. Findings likely due to mild interstitial edema/vascular congestion. Not exclude infection in the lung bases. Stable cardiomegaly. Metallic device projects over the anterior left chest wall unchanged. Remainder of the exam is unchanged. IMPRESSION: 1. Findings suggesting mild interstitial edema/vascular congestion. Infection in  the lung bases is possible. 2. Stable cardiomegaly. Electronically Signed   By: Marin Olp M.D.   On: 08/30/2022 15:35    PHYSICAL EXAM  General - Well nourished, well developed, intubated on sedation.   Ophthalmologic - fundi not visualized due to noncooperation.   Cardiovascular - irregularly irregular heart rate and rhythm.   Neuro - intubated on propofol, eyes closed, not following commands.  With forced eye opening, eyes in mid position,  not blinking to visual threat, doll's eyes absent, not tracking, pupils 77m and reactive to light. Corneal reflex absent, gag and cough present. Breathing over the vent.   Facial symmetry not able to test due to ET tube.  Tongue protrusion not cooperative.  On pain stimulation, no movement of all extremities. DTR 1+ and no babinski. Sensation, coordination and gait not tested.     ASSESSMENT/PLAN Mr. DKAEGAN HETTICHis a 87y.o. male with history of  alcoholic cardiomyopathy, permanent atrial fibrillation on Eliquis, CHF with an EF 25 to 30% with global hypokinesis, CAD s/p stenting, DM2, prior stroke, chronic bilateral lower extremity wounds with edema, LLE peroneal neuropathy with chronic foot drop, COPD, HLD and HTN who presented to the ED via EMS this afternoon after he was found down.  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  CTA head & neck Occlusion of the left V4 segment and poor opacification of the basilar artery with apparent occlusion distally, age indeterminate. The bilateral PCAs are supplied by posterior communicating arteries with likely retrograde flow into the P1 segments.Multifocal high-grade stenosis of the right M2 and M3 branches   S/p IR with TICI2c reperfusion MRI - Multiple areas of acute infarct involving the cerebellum bilaterally, left  pons, and right frontal white matter. 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 Large area of chronic infarct and mild chronic hemorrhage and in the right MCA territory.  MRA  - Basilar artery is patent now.   2D Echo EF 20-25%  LDL 56 HgbA1c 6.5 VTE prophylaxis - Heparin subq Eliquis (apixaban) daily prior to admission, now on heparin IV. However, given the large size of cerebellar infarct, will switch to ASA for now. Once stable in 3-5 days, [facility]:  Pending Disposition:  Pending  Chronic Atrial Fibrillation  Home med: Eliquis Rate controlled On heparin IV.  However given the large size of the cerebral infarct, will switch to aspirin for now.  Once stable in 3 to 5 days will resume AC.  Congestive heart failure Alcoholic cardiomyopathy 87/3710EF 25 to 30%.  This admission EF 20 to 25%. Home meds including Imdur, digoxin, Jardiance, lasix On digoxin CCM managing.  Hypertension Home meds:  Lasix, coreg, aldactone Stable BP goal 120-140 for 24 hours post IR Long-term BP goal normotensive  Post operative respiratory insufficiency  Remains intubated post procedure CCM to manage ventilator settings Hx of COPD  Hyperlipidemia Home meds:  Atorvastatin '80mg'$   LDL 56, goal < 70 AST/ALT 64/48-> pending Will resume statin once LFT normalizes Continue statin at discharge  Diabetes type II Controlled Home meds:  Jardiance, metformin HgbA1c 6.5, goal < 7.0 CBGs SSI  Other Stroke Risk Factors Advanced Age >/= 641 Hx stroke/TIA Evidence of chronic infarcts on imaging Coronary artery disease status post stenting patient  Other Active Problems Chronic BLE wound Chronic b/l foot drop Recent left hip fracture - walking with walker at home Leukopenia WBC 3.8 Hypokalemia K 3.2 [facility]  Patient seen and examined by NP/APP with MD. MD to update note as needed.   DJanine Ores DNP,  FNP-BC [facility] Pager: [phone] ATTENDING NOTE: I reviewed above note and agree with the assessment and plan. Pt was seen  and examined.   87 years old male with history of alcohol cardiomyopathy, A-fib on Eliquis, CHF, CAD status post stenting, hypertension, hyperlipidemia, diabetes, chronic BLE wounds and foot drop, recent left hip fracture admitted for found down, nausea vomiting and dizziness.  CT no acute abnormality.  CTA that showed distal VA occlusion, left V4 occlusion, bilateral fetal PCAs, right M2 and M3 high-grade stenosis, bilateral ICA siphon moderate to severe stenosis.  Status post IR with TICI2c.  MRI showed large bilateral cerebellar infarct, left pontine and small right ACA infarcts.  EF 20 to 25%, LDL 56, A1c 6.5, creatinine 1.00.  AST/ALT 64/48.  WBC 3.8.  On exam, sister at the bedside, pt still intubated on propofol, eyes closed, not following commands. With forced eye opening, eyes in mid position, not blinking to visual threat, doll's eyes absent, not tracking, pupils 95m and reactive to light. Corneal reflex absent, gag and cough present. Breathing over the vent.  Facial symmetry not able to test due to ET tube.  Tongue protrusion not cooperative. On pain stimulation, no movement of all extremities. DTR 1+ and no babinski. Sensation, coordination and gait not tested.   Etiology of patient's stroke likely embolic due [facility]  Currently still intubated, not candidate for intubation today, Vent management per CCM.  Worsening cardiomyopathy, on digoxin, appreciate CCM assistance.  Was on heparin IV, given large size of stroke, will DC heparin IV changed to aspirin 325.  Once stable for 3 to 5 days, may resume AC.  Hold off statin for now given elevated LFTs.  Once normalized, will resume statin.  PT and OT pending.  For detailed assessment and plan, please refer to above/below as I have made changes wherever appropriate.   JRosalin Hawking MD  PhD Stroke Neurology 09/06/2022 5:41 PM  This patient is critically ill due to cerebellum and pontine infarct due to bilateral occlusion, status post thrombectomy, chronic A-fib, severe cardiomyopathy and at significant risk of neurological worsening, death form recurrent stroke, hemorrhagic transformation, hydrocephalus, heart failure, cardiac rest, seizure. This patient's care requires constant monitoring of vital signs, hemodynamics, respiratory and cardiac monitoring, review of multiple databases, neurological assessment, discussion with family, other specialists and medical decision making of high complexity. I spent 40 minutes of neurocritical care time in the care of this patient. I had long discussion with sister at bedside, updated pt current condition, treatment plan and potential prognosis, and answered all the questions.  She expressed understanding and appreciation.      To contact Stroke Continuity provider, please refer to [email or web address] After hours, contact General Neurology

## 2022-09-06 NOTE — Anesthesia Postprocedure Evaluation (Signed)
Anesthesia Post Note  Patient: Vincent [name]  Procedure(s) Performed: RADIOLOGY WITH ANESTHESIA     Patient location during evaluation: SICU Anesthesia Type: General Level of consciousness: sedated Pain management: pain level controlled Vital Signs Assessment: post-procedure vital signs reviewed and stable Respiratory status: patient remains intubated per anesthesia plan Cardiovascular status: stable Postop Assessment: no apparent nausea or vomiting Anesthetic complications: no   No notable events documented.  Last Vitals:  Vitals:   09/06/22 0545 09/06/22 0600  BP:    Pulse: 94   Resp: 18   Temp:  37 C  SpO2: 100%     Last Pain:  Vitals:   09/06/22 0600  TempSrc: Oral  PainSc:                  Vincent [name]

## 2022-09-06 NOTE — Progress Notes (Signed)
SLP Cancellation Note  Patient Details Name: Vincent [name] MRN: [id number] DOB: 1936/08/11   Cancelled treatment:       Reason Eval/Treat Not Completed: Patient not medically ready. Pt remains intubated, will f/u    Aisea Bouldin, Katherene Ponto 09/06/2022, 7:35 AM

## 2022-09-06 NOTE — Progress Notes (Signed)
NAME:  Vincent [name], MRN:  [id number], DOB:  10/22/1935, LOS: 1 ADMISSION DATE:  09/12/2022, CONSULTATION DATE:  09/06/2022 REFERRING MD:  Marjory Lies - NIR CHIEF COMPLAINT:  S/p basilar artery occlusion, vent Management   History of Present Illness:  87 year old man who presented to [facility] ED 1/8 as a Code Stroke after being found down at home. Presented with R gaze preference, L-sided deficits and dysarthria. PMHx significant for HTN, HLD, AF (on Eliquis), CHF (EF 25-30%), CAD s/p stent (2004), prior CVA with residual L-sided deficits, COPD, T2DM.  CTA Head/Neck demonstrated distal basilar artery occlusion, left V4 occlusion. He was seen by neurology and IR and was taken to IR for revascularization of distal basilar artery; TICI 2C revascularization achieved. Post-procedure CT negative for hemorrhage.  Post-procedure, he was left intubated and was transferred to ICU.   PCCM was asked to see for vent management.  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 s/p remote stent to Cx 2004 with chronic stable angina in context of residual circumflex disease.   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  Interim History / Subjective:  No significant events since procedure Remains intubated, sedated When weaning sedation, becomes mildly agitated, not following commands Re-sedated for MRI Echo today Sister updated at bedside  Objective:  Blood pressure (!) 114/49, pulse 78, temperature 99.4 F (37.4 C), temperature source Oral, resp. rate (!) 21, height '5\' 9"'$  (1.753 m), weight 75.7 kg, SpO2 100 %.    Vent Mode: PRVC FiO2 (%):  [40 %-60 %] 40 % Set Rate:  [15 bmp] 15 bmp Vt Set:  [500 mL] 500 mL PEEP:  [5 cmH20] 5 cmH20 Plateau Pressure:  [13 cmH20-15 cmH20] 13 cmH20   Intake/Output Summary (Last 24 hours) at 09/06/2022 0955 Last data filed at 09/06/2022 0900 Gross per 24 hour  Intake 2495.41 ml  Output 2300 ml  Net 195.41 ml   Filed Weights   09/06/22 0200 09/06/22 0432  Weight: 84.2 kg 75.7 kg   Physical Examination: General: Acutely ill-appearing elderly man in NAD. Intubated, lightly sedated. HEENT: [place]/AT, anicteric sclera, PERRL 25m, moist mucous membranes. ETT/OGT. Neuro: Sedated. Opens eyes to repetitive verbal stimuli/tactile and noxious stimuli. Following commands intermittently with facial expressions, lower extremities, less response in upper extremities. Moves BLE to command. +Corneal, +Cough, and +Gag  CV: Irregularly irregular rhythm, no m/g/r. PULM: Breathing even and unlabored on vent (PEEP 5, FiO2 40%). Lung fields coarse on R, clear on L. GI:  Soft, nontender, nondistended. Normoactive bowel sounds. Extremities: No significant LE edema noted. Skin: Warm/dry, no rashes, venous stasis changes of BLE.  Labs/imaging personally reviewed:  CTA head 1/8 > basilar artery occlusion, stenosis of right M2 and M3 branches, stenosis bilateral carotid siphons. MRI brain 1/9 >  Echo 1/9 >   Assessment & Plan:   Basilar artery  occlusion - s/p IR revascularization. CTA Head/Neck demonstrated distal basilar artery occlusion, left V4 occlusion. Taken to IR for revascularization of distal basilar artery; TICI 2C revascularization. Post-procedure CT negative for hemorrhage. - Stroke team primary - Post-procedure management per IR - F/u MRI Brain - Additional imaging per Neuro - Goal SBP 120-140 - Cleviprex titrated to goal SBP - F/u Echo, lipids, A1C - Neuroprotective measures: HOB > 30 degrees, normoglycemia, normothermia, electrolytes WNL  Respiratory insufficiency - due to inability to protect the airway in the setting of above. Hx COPD - Continue full vent support (4-8cc/kg IBW) - Wean FiO2 for O2 sat > 90% - Daily WUA/SBT as mental status allows; extubate 1/9PM versus 1/10AM - VAP bundle - Pulmonary hygiene - PAD protocol for sedation: Propofol and Fentanyl for goal RASS 0 to -1 - Follow CXR  Hx A.fib on Eliquis, sCHF (echo from Aug 2023 with EF 25%-30%) CAD s/p stenting HTN HLD - Heparin gtt in place of home Eliquis - F/u Echo - BP management as above - Continue statin, resume digoxin as appropriate - Hold home Lasix, spironolactone, Imdur, Entresto, Coreg; resume as clinically appropriate  Hx DM Hemoglobin A1C 6.5% - SSI - CBGs Q4H - Goal CBG 140-180 - Hold home medications  Best practice (evaluated daily):  Diet/type: NPO DVT prophylaxis: systemic heparin GI prophylaxis: PPI Lines: Arterial Line Foley:  N/A Code Status:  full code Last date of multidisciplinary goals of care discussion: Pending, sister [name] updated at bedside  Critical care time: 41 minutes   The patient is critically ill with multiple organ system failure and requires high complexity decision making for assessment and support, frequent evaluation and titration of therapies, advanced monitoring, review of radiographic studies and interpretation of complex data.   Critical Care Time devoted to patient care services,  exclusive of separately billable procedures, described in this note is 41 minutes.  Lestine Mount, PA-C [facility] 09/06/22 9:55 AM  Please see [email or web address] for pager details.  From 7A-7P if no response, please call [phone] After hours, please call ELink [phone]

## 2022-09-06 NOTE — Consult Note (Signed)
NAME:  Vincent [name], MRN:  [id number], DOB:  08-18-1936, LOS: 1 ADMISSION DATE:  08/30/2022, CONSULTATION DATE:  09/06/22 REFERRING MD:  Marjory Lies CHIEF COMPLAINT:  Vent Management   History of Present Illness:  Vincent [name] is a 87 y.o. male who has a PMH as below including AF on Eliquis, CHF with EF 25-30%, CAD s/ pstent, DM, prior CVA. He presented to [facility] ED 1/8 after being found down at home. He had CTA that demonstrated a distal basilar artery occlusion, left V4 occlusion. He was seen by neurology and IR and was taken to IR for revascularization of distal basilar artery.  Post procedure, he was left intubated and was transferred to ICU where PCCM was asked to see for vent management.  Pertinent  Medical History:  has HLD (hyperlipidemia); Essential hypertension; Coronary atherosclerosis; Alcoholic cardiomyopathy ([place]); VENTRICULAR TACHYCARDIA; Persistent atrial fibrillation ([place]); BRADYCARDIA; Cerebral artery occlusion with cerebral infarction ([place]); PULMONARY INFILTRATE INCLUDES (EOSINOPHILIA); SYNCOPE; COUGH; GLAUCOMA; GERD; HEMORRHAGE OF RECTUM AND ANUS; Long term current use of anticoagulant; Rectal bleeding; External hemorrhoid, bleeding; Encounter for therapeutic drug monitoring; Dizziness; Near syncope; Precordial pain; Foot drop, left; Gait disorder; Abnormal stress test; 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; Hemiparesis and alteration of sensations as late effects of stroke ([place]); Chest pain; CHF (congestive heart failure) ([place]); Elevated troponin; Chronic systolic CHF (congestive heart failure) ([place]); NSTEMI (non-ST elevated myocardial infarction) ([place]); Type 2 diabetes mellitus with foot ulcer ([place]); Chronic leg wounds; Hyponatremia; Acquired thrombophilia ([place]); Benign prostatic hyperplasia; Diabetic peripheral neuropathy ([place]); Gout; 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 ([place]); Hyperglycemia due to type 2 diabetes mellitus ([place]); Hypertensive retinopathy; Leg ulcer  ([place]); Neutropenia ([place]); Acute on chronic combined systolic and diastolic CHF (congestive heart failure) ([place]); Acute respiratory failure with hypoxia ([place]); Fall; Type 2 diabetes mellitus with hyperlipidemia ([place]); Permanent atrial fibrillation ([place]); Pelvic fracture ([place]); Bilateral pubic rami fractures ([place]); Acetabular fracture ([place]); Normocytic anemia; History of stroke with residual deficit; Chronic wound; Hypotension; Hypokalemia; Status post surgery; and Stroke (cerebrum) ([place]) on their problem list.  Significant Hospital Events: Including procedures, antibiotic start and stop dates in addition to other pertinent events   1/9 admit, to VIR for revascularization of occluded basilar artery.  Interim History / Subjective:  Sedated on vent.  Objective:  Blood pressure (!) 162/81, pulse 63, temperature (!) 96 F (35.6 C), temperature source Axillary, resp. rate 16, SpO2 100 %.        Intake/Output Summary (Last 24 hours) at 09/06/2022 0130 Last data filed at 09/06/2022 0050 Gross per 24 hour  Intake 1050 ml  Output 825 ml  Net 225 ml   There were no vitals filed for this visit.  Examination: General: Elderly male on vent, in NAD. Neuro: Sedated, not responsive. HEENT: [place]/AT. Sclerae anicteric. ETT in place. Cardiovascular: IRIR, no M/R/G.  Lungs: Respirations even and unlabored.  CTA bilaterally, No W/R/R. Abdomen: BS x 4, soft, NT/ND.  Musculoskeletal: Venous statis changes BLE, no edema.  Skin: Some skin flaking with venous stasis changes BLE. Otherwise intact, warm, no rashes.  Labs/imaging personally reviewed:  CTA head 1/8 > basilar artery occlusion, stenosis of right M2 and M3 branches, stenosis bilateral carotid siphons. MRI brain 1/9 >  Echo 1/9 >   Assessment & Plan:   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 - Post procedure management per IR. - Stroke workup / management per neuro. - F/u on CT head, MRI brain, echo. - Cleviprex PRN for goal SBP 120  -  140 per neuro.  Respiratory insufficiency - due to inability to protect the airway in the setting of above. Hx COPD. - Full vent support. - Assess ABG. - Wean as able. - Daily SBT with hopeful extubation in AM. - Bronchial hygiene. - Follow CXR.  Hx A.fib on Eliquis, sCHF (echo from Aug 2023 with EF 25%-30%), CAD s/p stenting, HTN, HLD. - Heparin gtt in lie of home Eliquis. - F/u on echo. - Cleviprex as above. - Continue home Atorvastatin, Digoxin. - Hold home Furosemide, Spironolactone, Imdur, Entresto, Carvedilol.  Hx DM. - SSI. - Hold home Jardiance, Metformin,   Best practice (evaluated daily):  Diet/type: NPO DVT prophylaxis: systemic heparin GI prophylaxis: PPI Lines: Arterial Line Foley:  N/A Code Status:  full code Last date of multidisciplinary goals of care discussion: None yet.  Labs   CBC: Recent Labs  Lab 09/21/2022 1520 09/04/2022 1547  WBC 3.8*  --   NEUTROABS 2.9  --   HGB 11.1* 12.2*  12.2*  HCT 36.7* 36.0*  36.0*  MCV 88.2  --   PLT 149*  --     Basic Metabolic Panel: Recent Labs  Lab 09/23/2022 1520 08/30/2022 1547  NA 140 143  144  K 3.3* 3.7  3.7  CL 106 103  CO2 26  --   GLUCOSE 152* 153*  BUN 8 8  CREATININE 1.09 1.00  CALCIUM 8.8*  --    GFR: CrCl cannot be calculated (Unknown ideal weight.). Recent Labs  Lab 09/21/2022 1520 09/01/2022 1742  WBC 3.8*  --   LATICACIDVEN 2.7* 1.6    Liver Function Tests: Recent Labs  Lab 09/25/2022 1520  AST 64*  ALT 48*  ALKPHOS 144*  BILITOT 1.8*  PROT 6.9  ALBUMIN 3.3*   No results for input(s): "LIPASE", "AMYLASE" in the last 168 hours. No results for input(s): "AMMONIA" in the last 168 hours.  ABG    Component Value Date/Time   PHART 7.462 (H) 09/20/2021 0808   PCO2ART 38.6 09/20/2021 0808   PO2ART 125 (H) 09/20/2021 0808   HCO3 29.5 (H) 09/25/2022 1547   TCO2 28 09/21/2022 1547   TCO2 31 09/25/2022 1547   O2SAT 25 09/04/2022 1547     Coagulation Profile: No results for  input(s): "INR", "PROTIME" in the last 168 hours.  Cardiac Enzymes: Recent Labs  Lab 09/18/2022 1520  CKTOTAL 59    HbA1C: Hgb A1c MFr Bld  Date/Time Value Ref Range Status  09/17/2021 05:23 PM 7.0 (H) 4.8 - 5.6 % Final    Comment:    (NOTE)         Prediabetes: 5.7 - 6.4         Diabetes: >6.4         Glycemic control for adults with diabetes: <7.0   11/28/2017 12:01 PM 14.3 (H) 4.8 - 5.6 % Final    Comment:             Prediabetes: 5.7 - 6.4          Diabetes: >6.4          Glycemic control for adults with diabetes: <7.0     CBG: No results for input(s): "GLUCAP" in the last 168 hours.  Review of Systems:   Unable to obtain as pt is encephalopathic.  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   Surgical History:   Past Surgical History:  Procedure Laterality Date   CARDIAC CATHETERIZATION N/A 07/30/2015   Procedure: Left Heart Cath and Coronary Angiography;  Surgeon: Belva Crome, MD;  Location: [place] [facility];  Service: Cardiovascular;  Laterality: N/A;   COLONOSCOPY     KNEE SURGERY     loop recorder  09/2007   RIGHT/LEFT HEART CATH AND CORONARY ANGIOGRAPHY N/A 09/20/2021   Procedure: RIGHT/LEFT HEART CATH  AND CORONARY ANGIOGRAPHY;  Surgeon: Martinique, Peter M, MD;  Location: [place] [facility];  Service: Cardiovascular;  Laterality: N/A;     Social History:   reports that he quit smoking about 39 years ago. His smoking use included cigarettes. He has a 7.50 pack-year smoking history. He has never used smokeless tobacco. He reports that he does not drink alcohol and does not use drugs.   Family History:  His family history includes Cancer in his brother, mother, and sister; Colon cancer in his mother; Heart attack in his father and sister; Heart disease in his father; Hypertension in his father and mother; Prostate cancer in his brother. There is no history of Stroke, Colon polyps, Esophageal cancer, Stomach cancer, Rectal cancer, or Pancreatic cancer.   Allergies No Known Allergies   Home Medications  Prior to Admission medications   Medication Sig Start Date End Date Taking? Authorizing Provider  acetaminophen (TYLENOL) 500 MG tablet Take 2 tablets (1,000 mg total) by mouth every 6 (six) hours as needed. Patient taking differently: Take 1,000 mg by mouth every 6 (six) hours as needed for moderate pain or mild pain. 02/21/21   [name], MD  apixaban (ELIQUIS) 5 MG TABS tablet Take 1 tablet (5 mg total) by mouth 2 (two) times daily. 05/16/22   Larey Dresser, MD  atorvastatin (LIPITOR) 80 MG tablet Take 1 tablet (80 mg total) by mouth daily. 06/29/22   Milford, Maricela Bo, FNP  Brimonidine Tartrate (ALPHAGAN P OP) Place 1 drop into both eyes in the morning and at bedtime.    [provider]  Calcium Carbonate-Vitamin D (OSCAL 500 D-3 PO) Take 1 tablet by mouth 2 (two) times daily.    [provider]  carvedilol (COREG) 6.25 MG tablet Take 1 tablet (6.25 mg total) by mouth 2 (two) times daily with a meal. 12/07/21   Larey Dresser, MD  Cyanocobalamin (VITAMIN B-12 CR PO) Take 1 tablet by mouth daily.    [provider]  digoxin (LANOXIN) 0.125 MG tablet Take 1 tablet  (0.125 mg total) by mouth daily. 06/29/22   [place]ford, Maricela Bo, FNP  empagliflozin (JARDIANCE) 25 MG TABS tablet Take 25 mg by mouth daily.    [provider]  famotidine (PEPCID) 20 MG tablet Take 20 mg by mouth daily.    [provider]  Ferrous Sulfate (IRON PO) Take 1 tablet by mouth daily.    [provider]  furosemide (LASIX) 40 MG tablet Take 40 mg by mouth daily.    [provider]  isosorbide mononitrate (IMDUR) 30 MG 24 hr tablet Take 1 tablet (30 mg total)  by mouth daily. 12/17/21   Larey Dresser, MD  loratadine (CLARITIN) 10 MG tablet Take 10 mg by mouth daily.    [provider]  LUMIGAN 0.01 % SOLN Place 1 drop into both eyes at bedtime. 06/05/22   [provider]  Magnesium 400 MG TABS Take 400 mg by mouth 2 (two) times daily.    [provider]  metFORMIN (GLUCOPHAGE) 500 MG tablet Take 500 mg by mouth 2 (two) times daily. 06/16/19   [provider]  nitroGLYCERIN (NITROLINGUAL) 0.4 MG/SPRAY spray Place 1 spray under the tongue every 5 (five) minutes x 3 doses as needed for chest pain. 06/29/22   [place], Maricela Bo, FNP  Omega-3 Fatty Acids (OMEGA-3 FISH OIL PO) Take 1 capsule by mouth daily.    [provider]  oxyCODONE (OXY IR/ROXICODONE) 5 MG immediate release tablet Take 1 tablet (5 mg total) by mouth every 6 (six) hours as needed for severe pain. 07/27/22   Arrien, Jimmy Picket, MD  sacubitril-valsartan (ENTRESTO) 97-103 MG Take 1 tablet by mouth 2 (two) times daily. 12/07/21   Larey Dresser, MD  spironolactone (ALDACTONE) 25 MG tablet Take 25 mg by mouth daily.    [provider]     Critical care time: 35 min.   Montey Hora, [facility] For pager details, please see AMION or use Epic chat  After 1900, please call [place] for cross coverage needs 09/06/2022, 1:30 AM

## 2022-09-06 NOTE — Progress Notes (Signed)
Echocardiogram 2D Echocardiogram has been performed.  Ronny Flurry 09/06/2022, 10:35 AM

## 2022-09-06 NOTE — Progress Notes (Signed)
ANTICOAGULATION CONSULT NOTE - Initial Consult  Pharmacy Consult for heparin Indication: VTE treatment - Eliquis PTA  No Known Allergies  Patient Measurements: Height: '5\' 9"'$  (175.3 cm) Weight: 84.2 kg (185 lb 10 oz) IBW/kg (Calculated) : 70.7 Heparin Dosing Weight: 84.2 Kg  Vital Signs: Temp: 96 F (35.6 C) (01/09 0127) Temp Source: Axillary (01/09 0127) BP: 118/59 (01/09 0200) Pulse Rate: 58 (01/09 0200)  Labs: Recent Labs    09/06/2022 1520 09/07/2022 1547 09/21/2022 1742  HGB 11.1* 12.2*  12.2*  --   HCT 36.7* 36.0*  36.0*  --   PLT 149*  --   --   CREATININE 1.09 1.00  --   CKTOTAL 59  --   --   TROPONINIHS 60*  --  58*    Estimated Creatinine Clearance: 55 mL/min (by C-G formula based on SCr of 1 mg/dL).   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 s/p remote stent to Cx 2004 with chronic stable angina in context of residual circumflex disease.   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 (Patient  taking differently: Take 1,000 mg by mouth every 6 (six) hours as needed for moderate pain or mild pain.) 30 tablet 0    apixaban (ELIQUIS) 5 MG TABS tablet Take 1 tablet (5 mg total) by mouth 2 (two) times daily. 60 tablet 5    atorvastatin (LIPITOR) 80 MG tablet Take 1 tablet (80 mg total) by mouth daily. 30 tablet 11    Brimonidine Tartrate (ALPHAGAN P OP) Place 1 drop into both eyes in the morning and at bedtime.      Calcium Carbonate-Vitamin D (OSCAL 500 D-3 PO) Take 1 tablet by mouth 2 (two) times daily.      carvedilol (COREG) 6.25 MG tablet Take 1 tablet (6.25 mg total) by mouth 2 (two) times daily with a meal. 60 tablet 11    Cyanocobalamin (VITAMIN B-12 CR PO) Take 1 tablet by mouth daily.      digoxin (LANOXIN) 0.125 MG tablet Take 1 tablet (0.125 mg total) by mouth daily. 90 tablet 3    empagliflozin (JARDIANCE) 25 MG TABS tablet Take 25 mg by mouth daily.      famotidine (PEPCID) 20 MG tablet Take 20 mg by mouth daily.      Ferrous Sulfate (IRON PO) Take 1 tablet by mouth daily.      furosemide (LASIX) 40 MG tablet Take 40  mg by mouth daily.      isosorbide mononitrate (IMDUR) 30 MG 24 hr tablet Take 1 tablet (30 mg total) by mouth daily. 90 tablet 3    loratadine (CLARITIN) 10 MG tablet Take 10 mg by mouth daily.      LUMIGAN 0.01 % SOLN Place 1 drop into both eyes at bedtime.      Magnesium 400 MG TABS Take 400 mg by mouth 2 (two) times daily.      metFORMIN (GLUCOPHAGE) 500 MG tablet Take 500 mg by mouth 2 (two) times daily.      nitroGLYCERIN (NITROLINGUAL) 0.4 MG/SPRAY spray Place 1 spray under the tongue every 5 (five) minutes x 3 doses as needed for chest pain. 12 g 6    Omega-3 Fatty Acids (OMEGA-3 FISH OIL PO) Take 1 capsule by mouth daily.      oxyCODONE (OXY IR/ROXICODONE) 5 MG immediate release tablet Take 1 tablet (5 mg total) by mouth every 6 (six) hours as needed for severe pain. 10 tablet 0    sacubitril-valsartan (ENTRESTO) 97-103 MG Take 1 tablet by mouth 2 (two)  times daily. 60 tablet 11    spironolactone (ALDACTONE) 25 MG tablet Take 25 mg by mouth daily.       Assessment:  55 yoM with stroke presenting after being found down at home by family. Patient's records report apixaban prior to admission (unknown last dose). Patient is now s/p IR thrombectomy (1/9). Hgb 12.2, PLT 149, Pharmacy to start heparin.   Goal of Therapy:  Heparin level 0.3-0.5 units/ml aPTT 66-85 seconds Monitor platelets by anticoagulation protocol: Yes   Plan:  NO BOLUS Start heparin infusion at 1000 units/hr Check anti-Xa level in 8 hours and daily while on heparin Continue to monitor H&H and platelets  Georga Bora, PharmD Clinical Pharmacist 09/06/2022 2:14 AM Please check AMION for all [place] numbers

## 2022-09-07 ENCOUNTER — Inpatient Hospital Stay (HOSPITAL_COMMUNITY): Payer: Medicare Other

## 2022-09-07 DIAGNOSIS — E440 Moderate protein-calorie malnutrition: Secondary | ICD-10-CM | POA: Insufficient documentation

## 2022-09-07 DIAGNOSIS — I255 Ischemic cardiomyopathy: Secondary | ICD-10-CM | POA: Diagnosis not present

## 2022-09-07 DIAGNOSIS — I639 Cerebral infarction, unspecified: Secondary | ICD-10-CM | POA: Diagnosis not present

## 2022-09-07 DIAGNOSIS — I651 Occlusion and stenosis of basilar artery: Secondary | ICD-10-CM | POA: Diagnosis not present

## 2022-09-07 LAB — BASIC METABOLIC PANEL
Anion gap: 7 (ref 5–15)
Anion gap: 11 (ref 5–15)
BUN: 8 mg/dL (ref 8–23)
BUN: 8 mg/dL (ref 8–23)
CO2: 24 mmol/L (ref 22–32)
CO2: 22 mmol/L (ref 22–32)
Calcium: 8.2 mg/dL — ABNORMAL LOW (ref 8.9–10.3)
Calcium: 8.3 mg/dL — ABNORMAL LOW (ref 8.9–10.3)
Chloride: 109 mmol/L (ref 98–111)
Chloride: 107 mmol/L (ref 98–111)
Creatinine, Ser: 0.94 mg/dL (ref 0.61–1.24)
Creatinine, Ser: 0.87 mg/dL (ref 0.61–1.24)
GFR, Estimated: >60 mL/min (ref >60)
GFR, Estimated: >60 mL/min (ref >60)
Glucose, Bld: 89 mg/dL (ref 70–99)
Glucose, Bld: 91 mg/dL (ref 70–99)
Potassium: 3.7 mmol/L (ref 3.5–5.1)
Potassium: 3.7 mmol/L (ref 3.5–5.1)
Sodium: 140 mmol/L (ref 135–145)
Sodium: 140 mmol/L (ref 135–145)

## 2022-09-07 LAB — CBC
HCT: 33.5 % — ABNORMAL LOW (ref 39.0–52.0)
Hemoglobin: 10.4 g/dL — ABNORMAL LOW (ref 13.0–17.0)
MCH: 26.6 pg (ref 26.0–34.0)
MCHC: 31 g/dL (ref 30.0–36.0)
MCV: 85.7 fL (ref 80.0–100.0)
Platelets: 144 10*3/uL — ABNORMAL LOW (ref 150–400)
RBC: 3.91 MIL/uL — ABNORMAL LOW (ref 4.22–5.81)
RDW: 18.6 % — ABNORMAL HIGH (ref 11.5–15.5)
WBC: 7.3 10*3/uL (ref 4.0–10.5)
nRBC: 0 % (ref 0.0–0.2)

## 2022-09-07 LAB — HEPATIC FUNCTION PANEL
ALT: 30 U/L (ref 0–44)
AST: 33 U/L (ref 15–41)
Albumin: 2.7 g/dL — ABNORMAL LOW (ref 3.5–5.0)
Alkaline Phosphatase: 110 U/L (ref 38–126)
Bilirubin, Direct: 0.5 mg/dL — ABNORMAL HIGH (ref 0.0–0.2)
Indirect Bilirubin: 1.6 mg/dL — ABNORMAL HIGH (ref 0.3–0.9)
Total Bilirubin: 2.1 mg/dL — ABNORMAL HIGH (ref 0.3–1.2)
Total Protein: 5.9 g/dL — ABNORMAL LOW (ref 6.5–8.1)

## 2022-09-07 LAB — GLUCOSE, CAPILLARY
Glucose-Capillary: 107 mg/dL — ABNORMAL HIGH (ref 70–99)
Glucose-Capillary: 108 mg/dL — ABNORMAL HIGH (ref 70–99)
Glucose-Capillary: 110 mg/dL — ABNORMAL HIGH (ref 70–99)
Glucose-Capillary: 138 mg/dL — ABNORMAL HIGH (ref 70–99)
Glucose-Capillary: 73 mg/dL (ref 70–99)
Glucose-Capillary: 91 mg/dL (ref 70–99)

## 2022-09-07 LAB — PHOSPHORUS
Phosphorus: 3.2 mg/dL (ref 2.5–4.6)
Phosphorus: 3.2 mg/dL (ref 2.5–4.6)

## 2022-09-07 LAB — MAGNESIUM
Magnesium: 2.2 mg/dL (ref 1.7–2.4)
Magnesium: 2 mg/dL (ref 1.7–2.4)

## 2022-09-07 LAB — TRIGLYCERIDES: Triglycerides: 81 mg/dL (ref <150)

## 2022-09-07 LAB — DIGOXIN LEVEL: Digoxin Level: 0.3 ng/mL — ABNORMAL LOW (ref 0.8–2.0)

## 2022-09-07 MED ORDER — ATORVASTATIN CALCIUM 80 MG PO TABS
80.0000 mg | ORAL_TABLET | Freq: Every day | ORAL | Status: DC
  Administered 2022-09-07 – 2022-09-19 (×13): 80 mg
  Filled 2022-09-07 (×13): qty 1

## 2022-09-07 MED ORDER — PROSOURCE TF20 ENFIT COMPATIBL EN LIQD
60.0000 mL | Freq: Every day | ENTERAL | Status: DC
  Administered 2022-09-07: 60 mL
  Filled 2022-09-07: qty 60

## 2022-09-07 MED ORDER — PROSOURCE TF20 ENFIT COMPATIBL EN LIQD
60.0000 mL | Freq: Two times a day (BID) | ENTERAL | Status: DC
  Administered 2022-09-07 – 2022-09-19 (×24): 60 mL
  Filled 2022-09-07 (×25): qty 60

## 2022-09-07 MED ORDER — PANTOPRAZOLE SODIUM 40 MG IV SOLR
40.0000 mg | INTRAVENOUS | Status: DC
  Administered 2022-09-07 – 2022-09-18 (×12): 40 mg via INTRAVENOUS
  Filled 2022-09-07 (×12): qty 10

## 2022-09-07 MED ORDER — OSMOLITE 1.5 CAL PO LIQD
1000.0000 mL | ORAL | Status: DC
  Administered 2022-09-07 – 2022-09-18 (×10): 1000 mL

## 2022-09-07 MED ORDER — FENTANYL 2500MCG IN NS 250ML (10MCG/ML) PREMIX INFUSION
0.0000 ug/h | INTRAVENOUS | Status: DC
  Administered 2022-09-07: 25 ug/h via INTRAVENOUS
  Filled 2022-09-07: qty 250

## 2022-09-07 MED ORDER — VITAL HIGH PROTEIN PO LIQD: 1000.0000 mL | ORAL | Status: DC

## 2022-09-07 NOTE — Progress Notes (Signed)
   Inpatient Rehab Admissions Coordinator :  Per therapy recommendations patient was screened for CIR candidacy by Tamatha Gadbois RN MSN. Patient is not yet at a level to tolerate the intensity required to pursue a CIR admit . Patient may have the potential to progress to become a candidate. The CIR admissions team will follow and monitor for progress and place a Rehab Consult order if felt to be appropriate. Please contact me with any questions.  Equilla Que RN MSN Admissions Coordinator [phone]  

## 2022-09-07 NOTE — Procedures (Signed)
Cortrak  Person Inserting Tube:  Alroy Dust, Avri Paiva L, RD Tube Type:  Cortrak - 43 inches Tube Size:  10 Tube Location:  Right nare Secured by: Bridle Technique Used to Measure Tube Placement:  Marking at nare/corner of mouth Cortrak Secured At:  73 cm   Cortrak Tube Team Note:  Consult received to place a Cortrak feeding tube.   X-ray is required, abdominal x-ray has been ordered by the Cortrak team. Please confirm tube placement before using the Cortrak tube.   If the tube becomes dislodged please keep the tube and contact the Cortrak team at [email or web address] for replacement.  If after hours and replacement cannot be delayed, place a NG tube and confirm placement with an abdominal x-ray.    Hermina Barters RD, LDN Clinical Dietitian See Shea Evans for contact information.

## 2022-09-07 NOTE — Progress Notes (Signed)
Referring Physician(s): Dr. 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 Estanislado Pandy.   Subjective: Patient intubated but weaning. Eyes are open and he is moving all extremities. Movement is less pronounced on the left. His wife is at the bedside.   Allergies: Patient has no known allergies.  Medications: Prior to Admission medications   Medication Sig Start Date End Date Taking? Authorizing Provider  apixaban (ELIQUIS) 5 MG TABS tablet Take 1 tablet (5 mg total) by mouth 2 (two) times daily. 05/16/22  Yes Larey Dresser, MD  carvedilol (COREG) 6.25 MG tablet Take 1 tablet (6.25 mg total) by mouth 2 (two) times daily with a meal. 12/07/21  Yes Larey Dresser, MD  acetaminophen (TYLENOL) 500 MG tablet Take 2 tablets (1,000 mg total) by mouth every 6 (six) hours as needed. Patient taking differently: Take 1,000 mg by mouth every 6 (six) hours as needed for moderate pain or mild pain. 02/21/21   [name], MD  atorvastatin (LIPITOR) 80 MG tablet Take 1 tablet (80 mg total) by mouth daily. 06/29/22   Milford, Maricela Bo, FNP  Brimonidine Tartrate (ALPHAGAN P OP) Place 1 drop into both eyes in the morning and at bedtime.    [provider]  Calcium Carbonate-Vitamin D (OSCAL 500 D-3 PO) Take 1 tablet by mouth 2 (two) times daily.    [provider]  Cyanocobalamin (VITAMIN B-12 CR PO) Take 1 tablet by mouth daily.    [provider]  digoxin (LANOXIN) 0.125 MG tablet Take 1 tablet (0.125 mg total) by mouth daily. 06/29/22   Milford, Maricela Bo, FNP  empagliflozin (JARDIANCE) 25 MG TABS tablet Take 25 mg by mouth daily.    [provider]  famotidine (PEPCID) 20 MG tablet Take 20 mg by mouth daily.    [provider]  Ferrous Sulfate (IRON PO) Take 1 tablet by mouth daily.    [provider]   furosemide (LASIX) 40 MG tablet Take 40 mg by mouth daily.    [provider]  isosorbide mononitrate (IMDUR) 30 MG 24 hr tablet Take 1 tablet (30 mg total) by mouth daily. 12/17/21   Larey Dresser, MD  loratadine (CLARITIN) 10 MG tablet Take 10 mg by mouth daily.    [provider]  LUMIGAN 0.01 % SOLN Place 1 drop into both eyes at bedtime. 06/05/22   [provider]  Magnesium 400 MG TABS Take 400 mg by mouth 2 (two) times daily.    [provider]  metFORMIN (GLUCOPHAGE) 500 MG tablet Take 500 mg by mouth 2 (two) times daily. 06/16/19   [provider]  nitroGLYCERIN (NITROLINGUAL) 0.4 MG/SPRAY spray Place 1 spray under the tongue every 5 (five) minutes x 3 doses as needed for chest pain. 06/29/22   Milford, Maricela Bo, FNP  Omega-3 Fatty Acids (OMEGA-3 FISH OIL PO) Take 1 capsule by mouth daily.    [provider]  oxyCODONE (OXY IR/ROXICODONE) 5 MG immediate release tablet Take 1 tablet (5 mg total) by mouth every 6 (six) hours as needed for severe pain. 07/27/22   Arrien, Jimmy Picket, MD  sacubitril-valsartan (ENTRESTO) 97-103 MG Take 1 tablet by mouth 2 (two) times daily. 12/07/21   Larey Dresser, MD  spironolactone (ALDACTONE) 25 MG tablet Take 25 mg by mouth daily.    [provider]  Vital Signs: BP 135/60 (BP Location: Left Arm)   Pulse (!) 44   Temp 99.3 F (37.4 C) (Axillary)   Resp 14   Ht '5\' 9"'$  (1.753 m)   Wt 166 lb 14.2 oz (75.7 kg)   SpO2 100%   BMI 24.65 kg/m   Physical Exam Constitutional:      Comments: Eyes open, on the ventilator.   Pulmonary:     Comments: Weaning on the ventilator Skin:    General: Skin is warm and dry.  Neurological:     Mental Status: He is alert.     Comments: Moving all extremities right > left. Did not follow commands during my assessment but he has followed commands earlier today per bedside RN.      Imaging: DG Abd Portable 1V  Result Date:  09/07/2022 CLINICAL DATA:  Tube placement EXAM: PORTABLE ABDOMEN - 1 VIEW COMPARISON:  09/06/2022 FINDINGS: Limited portable view of the upper abdomen demonstrates Dobbhoff tube with tip overlying the stomach. This could be advanced further. There is some gas seen in nondilated loops of bowel elsewhere in the abdomen. Preserved multiple stones in the gallbladder in the right upper quadrant. IMPRESSION: Limited x-ray for tube placement demonstrates Dobbhoff tube overlying the midbody of the stomach Electronically Signed   By: Jill Side M.D.   On: 09/07/2022 10:10   IR PERCUTANEOUS ART THROMBECTOMY/INFUSION INTRACRANIAL INC DIAG ANGIO  Result Date: 09/07/2022 INDICATION: Severe dysarthria, right-sided nystagmus, bilateral upper and lower extremity weakness and progressive decrease in level of consciousness. 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 EXAM: 1. EMERGENT LARGE VESSEL OCCLUSION THROMBOLYSIS (POSTERIOR CIRCULATION) COMPARISON:  CTA of the head and neck September 05, 2022. MEDICATIONS: Ancef 2 g IV antibiotic was administered within 1 hour of the procedure. ANESTHESIA/SEDATION: General anesthesia. CONTRAST:  Omnipaque 300 approximately 140 cc. FLUOROSCOPY TIME:  Fluoroscopy Time: 135 minutes 6 seconds (2702 mGy). COMPLICATIONS: None immediate. TECHNIQUE: Following a full explanation of the procedure along with the potential associated complications, an informed witnessed consent was obtained from the patient's sister. The risks of intracranial hemorrhage of 10%, worsening neurological deficit, ventilator dependency, death and inability to revascularize were all reviewed in detail with the patient's sister. The patient was then put under general anesthesia by the Department of Anesthesiology at [facility]. The right groin was prepped and draped in the usual sterile fashion. Thereafter using modified Seldinger technique, transfemoral access into the right common femoral artery  was obtained without difficulty. Over a 0.035 inch guidewire an 8 French 25 cm Pinnacle sheath was inserted. Through this, and also over a 0.035 inch guidewire a 5 [place] JB 1 catheter was advanced to the aortic arch region and selectively positioned in the innominate artery, the left subclavian artery and the right common carotid artery. FINDINGS: The left subclavian artery demonstrates a hypoplastic non dominant left vertebral artery with a patent origin. Vessel is seen to opacify to the cranial skull base where it terminates just distal to the left posterior-inferior cerebellar artery. The innominate arteriogram demonstrates patency of the right subclavian artery and the right common carotid artery proximally. The right vertebral artery at its origin demonstrates approximately 30% stenosis associated with an irregular atherosclerotic plaque. The vessel is, otherwise, seen to opacify to the cranial skull base. Opacification is noted into the right vertebrobasilar junction and the right posterior-inferior cerebellar artery. The right common carotid arteriogram demonstrates the right external carotid artery and its major branches to be widely patent. The  right internal carotid artery at the bulb to the cranial skull base is also widely patent. PROCEDURE: The 5 [place] JB 1 catheter was advanced distally into the right subclavian artery over an 035 inch guidewire. The guidewire was removed. Good aspiration obtained from the hub of the 5 French diagnostic catheter. A gentle control arteriogram performed through this demonstrated no evidence of spasms, dissections or of intraluminal filling defects. Again demonstrated was the stenotic origin of the right vertebral artery with moderate proximal tortuosity. The vessel, more distally, demonstrates moderate to moderately severe tortuosity to the right vertebrobasilar junction with slow stagnant flow noted distal to this. Over an 035 inch 300 cm Rosen exchange guidewire, an  8 French 90 cm Neuron Max guide catheter was advanced and positioned just proximal to the origin of the right vertebral artery. Over an 035 inch guidewire, a 132 cm 6 [place] Catalyst guide catheter was then advanced just proximal to the origin of the right vertebral artery. The glidewire was then advanced to the mid right vertebral artery. Subsequent advancement of the 6 [place] Catalyst guide catheter, and the Neuron Max sheath was met herniation into the aortic arch. Attempts were then made using 027 microcatheter with the 018 micro guidewire which resulted in instability of the platform resulting in herniation of the combination into the aortic arch. This approach was abandoned. The right radial artery was then identified with ultrasound, and its morphology documented and radial PACS system. A dorsal palmar anastomosis was verified present. Over an 018 inch micro guidewire, a 6/7 French radial sheath was then advanced without difficulty. The obturator, and the micro guidewire were removed. Good aspiration was obtained from the hub of the radial sheath. 200 mcg of nitroglycerin, and 2.5 mg of verapamil and 1000 units of heparin was then injected through the radial sheath without event. A right radial arteriogram was then performed. Over an 035 inch guidewire, a 5 French Simmons 2 diagnostic catheter was advanced into the proximal 1/3 of the right vertebral artery. The guidewire was removed. Good aspiration obtained from the hub of the diagnostic catheter. A gentle control arteriogram performed through this demonstrated moderately extensive tortuosity of the right vertebral artery to the cranial skull base. Also noted at this time was patency of the right posterior-inferior cerebellar artery with slow flow noted into the distal 1/3 of the basilar artery which remained occluded. Patency was noted of the anterior-inferior cerebellar arteries. Over an 035 inch 280 cm Terumo exchange guidewire, a 5 cm 072 Benchmark  catheter was advanced and positioned at the junction of the middle and the proximal 1/3 of the right vertebral artery. The guidewire was removed. Good aspiration obtained from the hub of the Benchmark guide catheter. A gentle control arteriogram performed through the Benchmark guide catheter demonstrated no evidence of spasms, dissections or of intraluminal filling defects. A combination of an 021 160 cm microcatheter inside of a 120 cm 45 Phenom plus catheter was advanced over an 014 inch support micro guidewire with a moderate J configuration, the combination was advanced without difficulty to the proximal basilar artery. The micro guidewire was then gently advanced into the left posterior cerebral artery proximal petrous region followed by the microcatheter. The guidewire was removed. Good aspiration obtained from the hub of the microcatheter. A gentle control arteriogram performed through the microcatheter demonstrated safe positioning of the tip of the microcatheter which was then connected to continuous heparinized saline infusion. The Benchmark microcatheter was advanced as distally as possible to the right vertebrobasilar  junction to the right posterior-inferior cerebellar artery. A 4 mm x 40 mm Solitaire X retrieval device was advanced and positioned in the P1 P2 junction more proximally into the basilar artery. Aspiration was applied at the hub of the Benchmark guide catheter for approximately 2-1/2 minutes. Thereafter, the retrieval device, the microcatheter and the Phenom Plus catheter were retrieved and removed. A control arteriogram performed through the Benchmark guide catheter in the distal right vertebral artery demonstrated complete revascularization of the basilar artery and of the left posterior cerebral artery and the left superior cerebellar artery. No supraclinoid opacification noted of the right P1 segment due to inflow from the anterior circulation. The right superior cerebellar artery  remained occluded. A TICI 2C revascularization was achieved. The Benchmark guide catheter was then retrieved more proximally into the right subclavian artery. A gentle control arteriogram performed at this site demonstrated patency of the right vertebral artery extra cranially and intracranially. The 072 guide catheter was removed. A wrist band was then applied at the hub of the radial puncture site. Distal right radial pulse was verified to be present. A flat panel CT of the brain demonstrated no evidence of intracranial hemorrhage. No mass effect was noted either. Patient was left intubated due to his medical condition. He was then transferred to the neuro ICU for post revascularization care. IMPRESSION: Status post endovascular complete revascularization of the occluded basilar artery, the left posterior cerebral artery and the left superior cerebellar artery and partially the right superior cerebellar artery with 1 pass with a 4 mm x 40 mm Solitaire X retrieval device and proximal aspiration. PLAN: Follow-up as per referring MD. Electronically Signed   By: Luanne Bras M.D.   On: 09/07/2022 08:31   IR CT Head Ltd  Result Date: 09/07/2022 INDICATION: Severe dysarthria, right-sided nystagmus, bilateral upper and lower extremity weakness and progressive decrease in level of consciousness. 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 EXAM: 1. EMERGENT LARGE VESSEL OCCLUSION THROMBOLYSIS (POSTERIOR CIRCULATION) COMPARISON:  CTA of the head and neck September 05, 2022. MEDICATIONS: Ancef 2 g IV antibiotic was administered within 1 hour of the procedure. ANESTHESIA/SEDATION: General anesthesia. CONTRAST:  Omnipaque 300 approximately 140 cc. FLUOROSCOPY TIME:  Fluoroscopy Time: 135 minutes 6 seconds (2702 mGy). COMPLICATIONS: None immediate. TECHNIQUE: Following a full explanation of the procedure along with the potential associated complications, an informed witnessed consent was obtained from  the patient's sister. The risks of intracranial hemorrhage of 10%, worsening neurological deficit, ventilator dependency, death and inability to revascularize were all reviewed in detail with the patient's sister. The patient was then put under general anesthesia by the Department of Anesthesiology at [facility]. The right groin was prepped and draped in the usual sterile fashion. Thereafter using modified Seldinger technique, transfemoral access into the right common femoral artery was obtained without difficulty. Over a 0.035 inch guidewire an 8 French 25 cm Pinnacle sheath was inserted. Through this, and also over a 0.035 inch guidewire a 5 [place] JB 1 catheter was advanced to the aortic arch region and selectively positioned in the innominate artery, the left subclavian artery and the right common carotid artery. FINDINGS: The left subclavian artery demonstrates a hypoplastic non dominant left vertebral artery with a patent origin. Vessel is seen to opacify to the cranial skull base where it terminates just distal to the left posterior-inferior cerebellar artery. The innominate arteriogram demonstrates patency of the right subclavian artery and the right common carotid artery proximally. The right vertebral  artery at its origin demonstrates approximately 30% stenosis associated with an irregular atherosclerotic plaque. The vessel is, otherwise, seen to opacify to the cranial skull base. Opacification is noted into the right vertebrobasilar junction and the right posterior-inferior cerebellar artery. The right common carotid arteriogram demonstrates the right external carotid artery and its major branches to be widely patent. The right internal carotid artery at the bulb to the cranial skull base is also widely patent. PROCEDURE: The 5 [place] JB 1 catheter was advanced distally into the right subclavian artery over an 035 inch guidewire. The guidewire was removed. Good aspiration obtained from the hub of  the 5 French diagnostic catheter. A gentle control arteriogram performed through this demonstrated no evidence of spasms, dissections or of intraluminal filling defects. Again demonstrated was the stenotic origin of the right vertebral artery with moderate proximal tortuosity. The vessel, more distally, demonstrates moderate to moderately severe tortuosity to the right vertebrobasilar junction with slow stagnant flow noted distal to this. Over an 035 inch 300 cm Rosen exchange guidewire, an 8 French 90 cm Neuron Max guide catheter was advanced and positioned just proximal to the origin of the right vertebral artery. Over an 035 inch guidewire, a 132 cm 6 [place] Catalyst guide catheter was then advanced just proximal to the origin of the right vertebral artery. The glidewire was then advanced to the mid right vertebral artery. Subsequent advancement of the 6 [place] Catalyst guide catheter, and the Neuron Max sheath was met herniation into the aortic arch. Attempts were then made using 027 microcatheter with the 018 micro guidewire which resulted in instability of the platform resulting in herniation of the combination into the aortic arch. This approach was abandoned. The right radial artery was then identified with ultrasound, and its morphology documented and radial PACS system. A dorsal palmar anastomosis was verified present. Over an 018 inch micro guidewire, a 6/7 French radial sheath was then advanced without difficulty. The obturator, and the micro guidewire were removed. Good aspiration was obtained from the hub of the radial sheath. 200 mcg of nitroglycerin, and 2.5 mg of verapamil and 1000 units of heparin was then injected through the radial sheath without event. A right radial arteriogram was then performed. Over an 035 inch guidewire, a 5 French Simmons 2 diagnostic catheter was advanced into the proximal 1/3 of the right vertebral artery. The guidewire was removed. Good aspiration obtained from the hub  of the diagnostic catheter. A gentle control arteriogram performed through this demonstrated moderately extensive tortuosity of the right vertebral artery to the cranial skull base. Also noted at this time was patency of the right posterior-inferior cerebellar artery with slow flow noted into the distal 1/3 of the basilar artery which remained occluded. Patency was noted of the anterior-inferior cerebellar arteries. Over an 035 inch 280 cm Terumo exchange guidewire, a 5 cm 072 Benchmark catheter was advanced and positioned at the junction of the middle and the proximal 1/3 of the right vertebral artery. The guidewire was removed. Good aspiration obtained from the hub of the Benchmark guide catheter. A gentle control arteriogram performed through the Benchmark guide catheter demonstrated no evidence of spasms, dissections or of intraluminal filling defects. A combination of an 021 160 cm microcatheter inside of a 120 cm 45 Phenom plus catheter was advanced over an 014 inch support micro guidewire with a moderate J configuration, the combination was advanced without difficulty to the proximal basilar artery. The micro guidewire was then gently advanced into the left posterior cerebral  artery proximal petrous region followed by the microcatheter. The guidewire was removed. Good aspiration obtained from the hub of the microcatheter. A gentle control arteriogram performed through the microcatheter demonstrated safe positioning of the tip of the microcatheter which was then connected to continuous heparinized saline infusion. The Benchmark microcatheter was advanced as distally as possible to the right vertebrobasilar junction to the right posterior-inferior cerebellar artery. A 4 mm x 40 mm Solitaire X retrieval device was advanced and positioned in the P1 P2 junction more proximally into the basilar artery. Aspiration was applied at the hub of the Benchmark guide catheter for approximately 2-1/2 minutes. Thereafter, the  retrieval device, the microcatheter and the Phenom Plus catheter were retrieved and removed. A control arteriogram performed through the Benchmark guide catheter in the distal right vertebral artery demonstrated complete revascularization of the basilar artery and of the left posterior cerebral artery and the left superior cerebellar artery. No supraclinoid opacification noted of the right P1 segment due to inflow from the anterior circulation. The right superior cerebellar artery remained occluded. A TICI 2C revascularization was achieved. The Benchmark guide catheter was then retrieved more proximally into the right subclavian artery. A gentle control arteriogram performed at this site demonstrated patency of the right vertebral artery extra cranially and intracranially. The 072 guide catheter was removed. A wrist band was then applied at the hub of the radial puncture site. Distal right radial pulse was verified to be present. A flat panel CT of the brain demonstrated no evidence of intracranial hemorrhage. No mass effect was noted either. Patient was left intubated due to his medical condition. He was then transferred to the neuro ICU for post revascularization care. IMPRESSION: Status post endovascular complete revascularization of the occluded basilar artery, the left posterior cerebral artery and the left superior cerebellar artery and partially the right superior cerebellar artery with 1 pass with a 4 mm x 40 mm Solitaire X retrieval device and proximal aspiration. PLAN: Follow-up as per referring MD. Electronically Signed   By: Luanne Bras M.D.   On: 09/07/2022 08:31   IR US Guide Vasc Access Right  Result Date: 09/07/2022 INDICATION: Severe dysarthria, right-sided nystagmus, bilateral upper and lower extremity weakness and progressive decrease in level of consciousness. 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 EXAM: 1. EMERGENT LARGE VESSEL OCCLUSION THROMBOLYSIS (POSTERIOR  CIRCULATION) COMPARISON:  CTA of the head and neck September 05, 2022. MEDICATIONS: Ancef 2 g IV antibiotic was administered within 1 hour of the procedure. ANESTHESIA/SEDATION: General anesthesia. CONTRAST:  Omnipaque 300 approximately 140 cc. FLUOROSCOPY TIME:  Fluoroscopy Time: 135 minutes 6 seconds (2702 mGy). COMPLICATIONS: None immediate. TECHNIQUE: Following a full explanation of the procedure along with the potential associated complications, an informed witnessed consent was obtained from the patient's sister. The risks of intracranial hemorrhage of 10%, worsening neurological deficit, ventilator dependency, death and inability to revascularize were all reviewed in detail with the patient's sister. The patient was then put under general anesthesia by the Department of Anesthesiology at [facility]. The right groin was prepped and draped in the usual sterile fashion. Thereafter using modified Seldinger technique, transfemoral access into the right common femoral artery was obtained without difficulty. Over a 0.035 inch guidewire an 8 French 25 cm Pinnacle sheath was inserted. Through this, and also over a 0.035 inch guidewire a 5 [place] JB 1 catheter was advanced to the aortic arch region and selectively positioned in the innominate artery, the left subclavian artery and the  right common carotid artery. FINDINGS: The left subclavian artery demonstrates a hypoplastic non dominant left vertebral artery with a patent origin. Vessel is seen to opacify to the cranial skull base where it terminates just distal to the left posterior-inferior cerebellar artery. The innominate arteriogram demonstrates patency of the right subclavian artery and the right common carotid artery proximally. The right vertebral artery at its origin demonstrates approximately 30% stenosis associated with an irregular atherosclerotic plaque. The vessel is, otherwise, seen to opacify to the cranial skull base. Opacification is noted  into the right vertebrobasilar junction and the right posterior-inferior cerebellar artery. The right common carotid arteriogram demonstrates the right external carotid artery and its major branches to be widely patent. The right internal carotid artery at the bulb to the cranial skull base is also widely patent. PROCEDURE: The 5 [place] JB 1 catheter was advanced distally into the right subclavian artery over an 035 inch guidewire. The guidewire was removed. Good aspiration obtained from the hub of the 5 French diagnostic catheter. A gentle control arteriogram performed through this demonstrated no evidence of spasms, dissections or of intraluminal filling defects. Again demonstrated was the stenotic origin of the right vertebral artery with moderate proximal tortuosity. The vessel, more distally, demonstrates moderate to moderately severe tortuosity to the right vertebrobasilar junction with slow stagnant flow noted distal to this. Over an 035 inch 300 cm Rosen exchange guidewire, an 8 French 90 cm Neuron Max guide catheter was advanced and positioned just proximal to the origin of the right vertebral artery. Over an 035 inch guidewire, a 132 cm 6 [place] Catalyst guide catheter was then advanced just proximal to the origin of the right vertebral artery. The glidewire was then advanced to the mid right vertebral artery. Subsequent advancement of the 6 [place] Catalyst guide catheter, and the Neuron Max sheath was met herniation into the aortic arch. Attempts were then made using 027 microcatheter with the 018 micro guidewire which resulted in instability of the platform resulting in herniation of the combination into the aortic arch. This approach was abandoned. The right radial artery was then identified with ultrasound, and its morphology documented and radial PACS system. A dorsal palmar anastomosis was verified present. Over an 018 inch micro guidewire, a 6/7 French radial sheath was then advanced without  difficulty. The obturator, and the micro guidewire were removed. Good aspiration was obtained from the hub of the radial sheath. 200 mcg of nitroglycerin, and 2.5 mg of verapamil and 1000 units of heparin was then injected through the radial sheath without event. A right radial arteriogram was then performed. Over an 035 inch guidewire, a 5 French Simmons 2 diagnostic catheter was advanced into the proximal 1/3 of the right vertebral artery. The guidewire was removed. Good aspiration obtained from the hub of the diagnostic catheter. A gentle control arteriogram performed through this demonstrated moderately extensive tortuosity of the right vertebral artery to the cranial skull base. Also noted at this time was patency of the right posterior-inferior cerebellar artery with slow flow noted into the distal 1/3 of the basilar artery which remained occluded. Patency was noted of the anterior-inferior cerebellar arteries. Over an 035 inch 280 cm Terumo exchange guidewire, a 5 cm 072 Benchmark catheter was advanced and positioned at the junction of the middle and the proximal 1/3 of the right vertebral artery. The guidewire was removed. Good aspiration obtained from the hub of the Benchmark guide catheter. A gentle control arteriogram performed through the Benchmark guide catheter demonstrated no evidence  of spasms, dissections or of intraluminal filling defects. A combination of an 021 160 cm microcatheter inside of a 120 cm 45 Phenom plus catheter was advanced over an 014 inch support micro guidewire with a moderate J configuration, the combination was advanced without difficulty to the proximal basilar artery. The micro guidewire was then gently advanced into the left posterior cerebral artery proximal petrous region followed by the microcatheter. The guidewire was removed. Good aspiration obtained from the hub of the microcatheter. A gentle control arteriogram performed through the microcatheter demonstrated safe  positioning of the tip of the microcatheter which was then connected to continuous heparinized saline infusion. The Benchmark microcatheter was advanced as distally as possible to the right vertebrobasilar junction to the right posterior-inferior cerebellar artery. A 4 mm x 40 mm Solitaire X retrieval device was advanced and positioned in the P1 P2 junction more proximally into the basilar artery. Aspiration was applied at the hub of the Benchmark guide catheter for approximately 2-1/2 minutes. Thereafter, the retrieval device, the microcatheter and the Phenom Plus catheter were retrieved and removed. A control arteriogram performed through the Benchmark guide catheter in the distal right vertebral artery demonstrated complete revascularization of the basilar artery and of the left posterior cerebral artery and the left superior cerebellar artery. No supraclinoid opacification noted of the right P1 segment due to inflow from the anterior circulation. The right superior cerebellar artery remained occluded. A TICI 2C revascularization was achieved. The Benchmark guide catheter was then retrieved more proximally into the right subclavian artery. A gentle control arteriogram performed at this site demonstrated patency of the right vertebral artery extra cranially and intracranially. The 072 guide catheter was removed. A wrist band was then applied at the hub of the radial puncture site. Distal right radial pulse was verified to be present. A flat panel CT of the brain demonstrated no evidence of intracranial hemorrhage. No mass effect was noted either. Patient was left intubated due to his medical condition. He was then transferred to the neuro ICU for post revascularization care. IMPRESSION: Status post endovascular complete revascularization of the occluded basilar artery, the left posterior cerebral artery and the left superior cerebellar artery and partially the right superior cerebellar artery with 1 pass with a 4  mm x 40 mm Solitaire X retrieval device and proximal aspiration. PLAN: Follow-up as per referring MD. Electronically Signed   By: Luanne Bras M.D.   On: 09/07/2022 08:31   IR ANGIO INTRA EXTRACRAN SEL INTERNAL CAROTID UNI R MOD SED  Result Date: 09/07/2022 INDICATION: Severe dysarthria, right-sided nystagmus, bilateral upper and lower extremity weakness and progressive decrease in level of consciousness. 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 EXAM: 1. EMERGENT LARGE VESSEL OCCLUSION THROMBOLYSIS (POSTERIOR CIRCULATION) COMPARISON:  CTA of the head and neck September 05, 2022. MEDICATIONS: Ancef 2 g IV antibiotic was administered within 1 hour of the procedure. ANESTHESIA/SEDATION: General anesthesia. CONTRAST:  Omnipaque 300 approximately 140 cc. FLUOROSCOPY TIME:  Fluoroscopy Time: 135 minutes 6 seconds (2702 mGy). COMPLICATIONS: None immediate. TECHNIQUE: Following a full explanation of the procedure along with the potential associated complications, an informed witnessed consent was obtained from the patient's sister. The risks of intracranial hemorrhage of 10%, worsening neurological deficit, ventilator dependency, death and inability to revascularize were all reviewed in detail with the patient's sister. The patient was then put under general anesthesia by the Department of Anesthesiology at [facility]. The right groin was prepped and draped in the usual sterile  fashion. Thereafter using modified Seldinger technique, transfemoral access into the right common femoral artery was obtained without difficulty. Over a 0.035 inch guidewire an 8 French 25 cm Pinnacle sheath was inserted. Through this, and also over a 0.035 inch guidewire a 5 [place] JB 1 catheter was advanced to the aortic arch region and selectively positioned in the innominate artery, the left subclavian artery and the right common carotid artery. FINDINGS: The left subclavian artery demonstrates a hypoplastic  non dominant left vertebral artery with a patent origin. Vessel is seen to opacify to the cranial skull base where it terminates just distal to the left posterior-inferior cerebellar artery. The innominate arteriogram demonstrates patency of the right subclavian artery and the right common carotid artery proximally. The right vertebral artery at its origin demonstrates approximately 30% stenosis associated with an irregular atherosclerotic plaque. The vessel is, otherwise, seen to opacify to the cranial skull base. Opacification is noted into the right vertebrobasilar junction and the right posterior-inferior cerebellar artery. The right common carotid arteriogram demonstrates the right external carotid artery and its major branches to be widely patent. The right internal carotid artery at the bulb to the cranial skull base is also widely patent. PROCEDURE: The 5 [place] JB 1 catheter was advanced distally into the right subclavian artery over an 035 inch guidewire. The guidewire was removed. Good aspiration obtained from the hub of the 5 French diagnostic catheter. A gentle control arteriogram performed through this demonstrated no evidence of spasms, dissections or of intraluminal filling defects. Again demonstrated was the stenotic origin of the right vertebral artery with moderate proximal tortuosity. The vessel, more distally, demonstrates moderate to moderately severe tortuosity to the right vertebrobasilar junction with slow stagnant flow noted distal to this. Over an 035 inch 300 cm Rosen exchange guidewire, an 8 French 90 cm Neuron Max guide catheter was advanced and positioned just proximal to the origin of the right vertebral artery. Over an 035 inch guidewire, a 132 cm 6 [place] Catalyst guide catheter was then advanced just proximal to the origin of the right vertebral artery. The glidewire was then advanced to the mid right vertebral artery. Subsequent advancement of the 6 [place] Catalyst guide catheter,  and the Neuron Max sheath was met herniation into the aortic arch. Attempts were then made using 027 microcatheter with the 018 micro guidewire which resulted in instability of the platform resulting in herniation of the combination into the aortic arch. This approach was abandoned. The right radial artery was then identified with ultrasound, and its morphology documented and radial PACS system. A dorsal palmar anastomosis was verified present. Over an 018 inch micro guidewire, a 6/7 French radial sheath was then advanced without difficulty. The obturator, and the micro guidewire were removed. Good aspiration was obtained from the hub of the radial sheath. 200 mcg of nitroglycerin, and 2.5 mg of verapamil and 1000 units of heparin was then injected through the radial sheath without event. A right radial arteriogram was then performed. Over an 035 inch guidewire, a 5 French Simmons 2 diagnostic catheter was advanced into the proximal 1/3 of the right vertebral artery. The guidewire was removed. Good aspiration obtained from the hub of the diagnostic catheter. A gentle control arteriogram performed through this demonstrated moderately extensive tortuosity of the right vertebral artery to the cranial skull base. Also noted at this time was patency of the right posterior-inferior cerebellar artery with slow flow noted into the distal 1/3 of the basilar artery which remained occluded. Patency was noted  of the anterior-inferior cerebellar arteries. Over an 035 inch 280 cm Terumo exchange guidewire, a 5 cm 072 Benchmark catheter was advanced and positioned at the junction of the middle and the proximal 1/3 of the right vertebral artery. The guidewire was removed. Good aspiration obtained from the hub of the Benchmark guide catheter. A gentle control arteriogram performed through the Benchmark guide catheter demonstrated no evidence of spasms, dissections or of intraluminal filling defects. A combination of an 021 160 cm  microcatheter inside of a 120 cm 45 Phenom plus catheter was advanced over an 014 inch support micro guidewire with a moderate J configuration, the combination was advanced without difficulty to the proximal basilar artery. The micro guidewire was then gently advanced into the left posterior cerebral artery proximal petrous region followed by the microcatheter. The guidewire was removed. Good aspiration obtained from the hub of the microcatheter. A gentle control arteriogram performed through the microcatheter demonstrated safe positioning of the tip of the microcatheter which was then connected to continuous heparinized saline infusion. The Benchmark microcatheter was advanced as distally as possible to the right vertebrobasilar junction to the right posterior-inferior cerebellar artery. A 4 mm x 40 mm Solitaire X retrieval device was advanced and positioned in the P1 P2 junction more proximally into the basilar artery. Aspiration was applied at the hub of the Benchmark guide catheter for approximately 2-1/2 minutes. Thereafter, the retrieval device, the microcatheter and the Phenom Plus catheter were retrieved and removed. A control arteriogram performed through the Benchmark guide catheter in the distal right vertebral artery demonstrated complete revascularization of the basilar artery and of the left posterior cerebral artery and the left superior cerebellar artery. No supraclinoid opacification noted of the right P1 segment due to inflow from the anterior circulation. The right superior cerebellar artery remained occluded. A TICI 2C revascularization was achieved. The Benchmark guide catheter was then retrieved more proximally into the right subclavian artery. A gentle control arteriogram performed at this site demonstrated patency of the right vertebral artery extra cranially and intracranially. The 072 guide catheter was removed. A wrist band was then applied at the hub of the radial puncture site. Distal  right radial pulse was verified to be present. A flat panel CT of the brain demonstrated no evidence of intracranial hemorrhage. No mass effect was noted either. Patient was left intubated due to his medical condition. He was then transferred to the neuro ICU for post revascularization care. IMPRESSION: Status post endovascular complete revascularization of the occluded basilar artery, the left posterior cerebral artery and the left superior cerebellar artery and partially the right superior cerebellar artery with 1 pass with a 4 mm x 40 mm Solitaire X retrieval device and proximal aspiration. PLAN: Follow-up as per referring MD. Electronically Signed   By: Luanne Bras M.D.   On: 09/07/2022 08:31   Portable Chest xray  Result Date: 09/07/2022 CLINICAL DATA:  Respiratory failure EXAM: PORTABLE CHEST 1 VIEW COMPARISON:  Prior chest x-ray 09/22/2022 FINDINGS: Stable significant enlargement of the cardiopericardial silhouette. Atherosclerotic calcifications are present in the transverse aorta. The patient is intubated. The tip of the endotracheal tube is 7.3 cm above the carina. A gastric tube is present. The proximal side-hole projects over the GE junction. An implantable loop recorder projects over the left chest. Pulmonary vascular congestion without overt edema. Low lung volumes with bibasilar atelectasis. No pneumothorax. IMPRESSION: 1. Stable cardiomegaly and pulmonary vascular congestion without overt pulmonary edema. 2. Bibasilar atelectasis. 3. Intubated. The tip of the endotracheal  tube is 7.3 cm above the carina. 4. Gastric tube is present. The proximal side-hole projects over the GE junction. Electronically Signed   By: Jacqulynn Cadet M.D.   On: 09/07/2022 07:53   ECHOCARDIOGRAM COMPLETE  Result Date: 09/06/2022    ECHOCARDIOGRAM REPORT   Patient Name:   Vincent [name] Date of Exam: 09/06/2022 Medical Rec #:  [id number]      Height:       69.0 in Accession #:    [id number]     Weight:        166.9 lb Date of Birth:  09/10/1935       BSA:          1.913 m Patient Age:    25 years       BP:           113/50 mmHg Patient Gender: M              HR:           86 bpm. Exam Location:  Inpatient Procedure: 2D Echo, Cardiac Doppler, Color Doppler and Intracardiac            Opacification Agent Indications:    Stroke I63.9  History:        Patient has prior history of Echocardiogram examinations, most                 recent 03/29/2022. CHF, Previous Myocardial Infarction and CAD,                 Stroke and COPD, Arrythmias:Tachycardia, Atrial Fibrillation and                 Bradycardia, Signs/Symptoms:Syncope, Hypotension and Chest Pain;                 Risk Factors:Hypertension, Diabetes and Dyslipidemia.  Sonographer:    Ronny Flurry Referring Phys: [place]  1. Left ventricular ejection fraction, by estimation, is 20 to 25%. The left ventricle has severely decreased function. The left ventricle demonstrates global hypokinesis. The left ventricular internal cavity size was severely dilated. Left ventricular diastolic function could not be evaluated.  2. Right ventricular systolic function is mildly reduced. The right ventricular size is mildly enlarged. There is moderately elevated pulmonary artery systolic pressure. The estimated right ventricular systolic pressure is 53.2 mmHg.  3. Left atrial size was severely dilated.  4. Right atrial size was severely dilated.  5. The mitral valve is grossly normal. Moderate mitral valve regurgitation.  6. The tricuspid valve is abnormal. Tricuspid valve regurgitation is moderate to severe.  7. The aortic valve is tricuspid. Aortic valve regurgitation is not visualized. No aortic stenosis is present.  8. The inferior vena cava is dilated in size with <50% respiratory variability, suggesting right atrial pressure of 15 mmHg. Comparison(s): No significant change from prior study. Conclusion(s)/Recommendation(s): No left ventricular mural or apical  thrombus/thrombi. FINDINGS  Left Ventricle: Left ventricular ejection fraction, by estimation, is 20 to 25%. The left ventricle has severely decreased function. The left ventricle demonstrates global hypokinesis. Definity contrast agent was given IV to delineate the left ventricular endocardial borders. The left ventricular internal cavity size was severely dilated. There is no left ventricular hypertrophy. Left ventricular diastolic function could not be evaluated due to atrial fibrillation. Left ventricular diastolic function could not be evaluated. Right Ventricle: The right ventricular size is mildly enlarged. No increase in right ventricular wall thickness. Right ventricular systolic function is mildly reduced. There  is moderately elevated pulmonary artery systolic pressure. The tricuspid regurgitant velocity is 3.28 m/s, and with an assumed right atrial pressure of 15 mmHg, the estimated right ventricular systolic pressure is 69.4 mmHg. Left Atrium: Left atrial size was severely dilated. Right Atrium: Right atrial size was severely dilated. Pericardium: There is no evidence of pericardial effusion. Mitral Valve: The mitral valve is grossly normal. Moderate mitral valve regurgitation. MV peak gradient, 8.2 mmHg. The mean mitral valve gradient is 3.0 mmHg. Tricuspid Valve: The tricuspid valve is abnormal. Tricuspid valve regurgitation is moderate to severe. No evidence of tricuspid stenosis. Aortic Valve: The aortic valve is tricuspid. Aortic valve regurgitation is not visualized. No aortic stenosis is present. Aortic valve mean gradient measures 7.0 mmHg. Aortic valve peak gradient measures 12.8 mmHg. Aortic valve area, by VTI measures 2.11  cm. Pulmonic Valve: The pulmonic valve was grossly normal. Pulmonic valve regurgitation is mild. No evidence of pulmonic stenosis. Aorta: The aortic root and ascending aorta are structurally normal, with no evidence of dilitation. Venous: The inferior vena cava is dilated  in size with less than 50% respiratory variability, suggesting right atrial pressure of 15 mmHg. IAS/Shunts: The atrial septum is grossly normal.  LEFT VENTRICLE PLAX 2D LVIDd:         7.00 cm      Diastology LVIDs:         6.40 cm      LV e' medial:    4.37 cm/s LV PW:         1.10 cm      LV E/e' medial:  27.9 LV IVS:        0.80 cm      LV e' lateral:   6.87 cm/s LVOT diam:     2.00 cm      LV E/e' lateral: 17.8 LV SV:         71 LV SV Index:   37 LVOT Area:     3.14 cm  LV Volumes (MOD) LV vol d, MOD A4C: 179.0 ml LV vol s, MOD A4C: 135.0 ml LV SV MOD A4C:     179.0 ml RIGHT VENTRICLE             IVC RV S prime:     10.70 cm/s  IVC diam: 2.10 cm TAPSE (M-mode): 1.1 cm LEFT ATRIUM             Index        RIGHT ATRIUM           Index LA diam:        4.30 cm 2.25 cm/m   RA Area:     23.10 cm LA Vol (A2C):   69.7 ml 36.43 ml/m  RA Volume:   77.90 ml  40.72 ml/m LA Vol (A4C):   50.6 ml 26.45 ml/m LA Biplane Vol: 58.9 ml 30.79 ml/m  AORTIC VALVE                     PULMONIC VALVE AV Area (Vmax):    2.28 cm      PR End Diast Vel: 8.53 msec AV Area (Vmean):   2.11 cm AV Area (VTI):     2.11 cm AV Vmax:           179.00 cm/s AV Vmean:          129.000 cm/s AV VTI:            0.336 m AV Peak Grad:  12.8 mmHg AV Mean Grad:      7.0 mmHg LVOT Vmax:         129.67 cm/s LVOT Vmean:        86.833 cm/s LVOT VTI:          0.226 m LVOT/AV VTI ratio: 0.67  AORTA Ao Root diam: 3.40 cm Ao Asc diam:  2.60 cm MITRAL VALVE                TRICUSPID VALVE MV Area VTI:  2.48 cm      TR Peak grad:   43.0 mmHg MV Peak grad: 8.2 mmHg      TR Vmax:        328.00 cm/s MV Mean grad: 3.0 mmHg MV Vmax:      1.43 m/s      SHUNTS MV Vmean:     79.0 cm/s     Systemic VTI:  0.23 m MR Peak grad: 82.8 mmHg     Systemic Diam: 2.00 cm MR Mean grad: 48.0 mmHg MR Vmax:      455.00 cm/s MR Vmean:     327.0 cm/s MV E velocity: 122.00 cm/s Eleonore Chiquito MD Electronically signed by Eleonore Chiquito MD Signature Date/Time: 09/06/2022/1:46:33 PM     Final    MR BRAIN WO CONTRAST  Result Date: 09/06/2022 CLINICAL DATA:  Stroke follow-up. Postop thrombectomy basilar artery 09/06/2022 EXAM: MRI HEAD WITHOUT CONTRAST MRA HEAD WITHOUT CONTRAST TECHNIQUE: Multiplanar, multi-echo pulse sequences of the brain and surrounding structures were acquired without intravenous contrast. Angiographic images of the Circle of Willis were acquired using MRA technique without intravenous contrast. COMPARISON:  CT head and CT angio head 09/13/2022 FINDINGS: MRI HEAD FINDINGS Brain: Moderate to large area of acute infarction right inferior cerebellum. Moderately large area of acute infarct left superior cerebellum with smaller areas of infarct in the left mid cerebellum. Small area of acute infarct in the left pons. Small area of acute infarct right frontal white matter. . 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 Large area of chronic infarct right MCA territory involving the right posterior frontal and parietal lobe. Chronic blood products in the right MCA infarct. Chronic microvascular ischemic change in the white matter right greater than left. No  mass identified.  Generalized atrophy without hydrocephalus. Vascular: Normal arterial flow voids at the skull base. Skull and upper cervical spine: No focal skeletal abnormality Sinuses/Orbits: Mucosal edema paranasal sinuses. Air-fluid level right maxillary sinus. Bilateral cataract extraction Other: None MRA HEAD FINDINGS Anterior circulation: Internal carotid artery patent bilaterally without stenosis. Anterior and middle cerebral arteries patent bilaterally without significant stenosis. Decreased signal in the middle cerebral artery M1 segment bilaterally felt to be due to vascular tortuosity. Posterior circulation: Right vertebral artery is tortuous and dominant and supplies the basilar. Right vertebral artery is patent. Right PICA not visualized. Left vertebral artery ends in PICA  Basilar is patent. This is occluded prior to procedure today. Superior cerebellar and posterior cerebral arteries patent bilaterally. Fetal origin right posterior cerebral artery Anatomic variants: None IMPRESSION: 1. Multiple areas of acute infarct involving the cerebellum bilaterally, left pons, and right frontal white matter. 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 Large area of chronic infarct and mild chronic hemorrhage and in the right MCA territory. 2. 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 Right vertebral artery patent. Left vertebral artery ends in PICA. Electronically Signed   By: Franchot Gallo M.D.   On:  09/06/2022 11:50   MR ANGIO HEAD WO CONTRAST  Result Date: 09/06/2022 CLINICAL DATA:  Stroke follow-up. Postop thrombectomy basilar artery 09/06/2022 EXAM: MRI HEAD WITHOUT CONTRAST MRA HEAD WITHOUT CONTRAST TECHNIQUE: Multiplanar, multi-echo pulse sequences of the brain and surrounding structures were acquired without intravenous contrast. Angiographic images of the Circle of Willis were acquired using MRA technique without intravenous contrast. COMPARISON:  CT head and CT angio head 09/27/2022 FINDINGS: MRI HEAD FINDINGS Brain: Moderate to large area of acute infarction right inferior cerebellum. Moderately large area of acute infarct left superior cerebellum with smaller areas of infarct in the left mid cerebellum. Small area of acute infarct in the left pons. Small area of acute infarct right frontal white matter. . 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 Large area of chronic infarct right MCA territory involving the right posterior frontal and parietal lobe. Chronic blood products in the right MCA infarct. Chronic microvascular ischemic change in the white matter right greater than left. No  mass identified.  Generalized atrophy without hydrocephalus. Vascular: Normal arterial flow voids at the  skull base. Skull and upper cervical spine: No focal skeletal abnormality Sinuses/Orbits: Mucosal edema paranasal sinuses. Air-fluid level right maxillary sinus. Bilateral cataract extraction Other: None MRA HEAD FINDINGS Anterior circulation: Internal carotid artery patent bilaterally without stenosis. Anterior and middle cerebral arteries patent bilaterally without significant stenosis. Decreased signal in the middle cerebral artery M1 segment bilaterally felt to be due to vascular tortuosity. Posterior circulation: Right vertebral artery is tortuous and dominant and supplies the basilar. Right vertebral artery is patent. Right PICA not visualized. Left vertebral artery ends in PICA Basilar is patent. This is occluded prior to procedure today. Superior cerebellar and posterior cerebral arteries patent bilaterally. Fetal origin right posterior cerebral artery Anatomic variants: None IMPRESSION: 1. Multiple areas of acute infarct involving the cerebellum bilaterally, left pons, and right frontal white matter. 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 Large area of chronic infarct and mild chronic hemorrhage and in the right MCA territory. 2. 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 Right vertebral artery patent. Left vertebral artery ends in PICA. Electronically Signed   By: Franchot Gallo M.D.   On: 09/06/2022 11:50   DG Abd 1 View  Result Date: 09/06/2022 CLINICAL DATA:  Orogastric tube placement. EXAM: ABDOMEN - 1 VIEW COMPARISON:  None Available. FINDINGS: Distal tip of nasogastric tube is seen in expected position of the stomach. IMPRESSION: Distal tip of nasogastric tube is seen in expected position of the stomach. Electronically Signed   By: Marijo Conception M.D.   On: 09/06/2022 08:12   CT Angio Head W or Wo Contrast  Result Date: 09/21/2022 CLINICAL DATA:  Dizziness and vomiting. Right-sided gaze preference. Stroke workup. EXAM: CT ANGIOGRAPHY HEAD  TECHNIQUE: Multidetector CT imaging of the head was performed using the standard protocol during bolus administration of intravenous contrast. Multiplanar CT image reconstructions and MIPs were obtained to evaluate the vascular anatomy. RADIATION DOSE REDUCTION: This exam was performed according to the departmental dose-optimization program which includes automated exposure control, adjustment of the mA and/or kV according to patient size and/or use of iterative reconstruction technique. CONTRAST:  11m OMNIPAQUE IOHEXOL 350 MG/ML SOLN COMPARISON:  Same-day noncontrast CT head. FINDINGS: CTA HEAD Anterior circulation: The imaged portions of the ICAs in the neck are patent. There is calcified plaque in the carotid siphons resulting in moderate to severe stenosis bilaterally. The right M1 segment is patent.  There is multifocal high-grade stenosis of the right M2 and M3 branches corresponding to the chronic MCA distribution infarct (7-96, 7-105, 7-117). The ACAs are patent, without proximal high-grade stenosis or occlusion. The anterior communicating artery is normal. There is no aneurysm or AVM. The left M1 segment is patent. The distal left MCA branches appear overall patent, without proximal high-grade stenosis or occlusion. Posterior circulation: The left V4 segment is occluded there is calcified plaque in the proximal V4 segment resulting in moderate to severe stenosis. There is poor opacification of the basilar artery throughout with the apparent occlusion distally. The bilateral PCAs are supplied by posterior communicating arteries with likely retrograde flow into the P1 segments. There is no proximal high-grade stenosis in the PCAs. There is no aneurysm or AVM. Venous sinuses: Suboptimally evaluated due to bolus timing. Anatomic variants: As above. Review of the MIP images confirms the above findings. IMPRESSION: 1. Occlusion of the left V4 segment and poor opacification of the basilar artery with apparent  occlusion distally, age indeterminate. The bilateral PCAs are supplied by posterior communicating arteries with likely retrograde flow into the P1 segments. 2. Multifocal high-grade stenosis of the right M2 and M3 branches relatively diminished perfusion corresponding to the chronic MCA distribution infarct. 3. Moderate to severe stenosis in the bilateral carotid siphons. These results were called by telephone at the time of interpretation on 09/08/2022 at 7:40 pm to provider Dr Cheral Marker, who verbally acknowledged these results. Electronically Signed   By: Valetta Mole M.D.   On: 09/25/2022 19:41   US Abdomen Limited RUQ (LIVER/GB)  Result Date: 09/04/2022 CLINICAL DATA:  Vomiting EXAM: ULTRASOUND ABDOMEN LIMITED RIGHT UPPER QUADRANT COMPARISON:  CT 09/27/2022 earlier.  Older exams as well. FINDINGS: Gallbladder: Gallbladder is mildly distended with dependent stones. No wall thickening. There is some trace ascites. Common bile duct: Diameter: 3 mm Liver: No focal lesion identified. Within normal limits in parenchymal echogenicity. Portal vein is patent on color Doppler imaging with normal direction of blood flow towards the liver. Other: Trace free fluid. IMPRESSION: Distended gallbladder with stones. No ductal dilatation. No further sonographic evidence of acute cholecystitis at this time. Electronically Signed   By: Jill Side M.D.   On: 09/18/2022 18:30   CT Head Wo Contrast  Result Date: 09/20/2022 CLINICAL DATA:  Neck trauma (Age >= 65y); Head trauma, minor (Age >= 65y) EXAM: CT HEAD WITHOUT CONTRAST CT CERVICAL SPINE WITHOUT CONTRAST TECHNIQUE: Multidetector CT imaging of the head and cervical spine was performed following the standard protocol without intravenous contrast. Multiplanar CT image reconstructions of the cervical spine were also generated. RADIATION DOSE REDUCTION: This exam was performed according to the departmental dose-optimization program which includes automated exposure control,  adjustment of the mA and/or kV according to patient size and/or use of iterative reconstruction technique. COMPARISON:  CT head and C-spine 07/20/2022 FINDINGS: CT HEAD FINDINGS Brain: Cerebral ventricle sizes are concordant with the degree of cerebral volume loss. 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 Chronic bilateral cerebellar infarctions. Patchy and confluent areas of decreased attenuation are noted throughout the deep and periventricular white matter of the cerebral hemispheres bilaterally, compatible with chronic microvascular ischemic disease. No evidence of large-territorial acute infarction. No parenchymal hemorrhage. No mass lesion. No extra-axial collection. No mass effect or midline shift. No hydrocephalus. Basilar cisterns are patent. Vascular: No hyperdense vessel. Skull: No acute fracture or focal lesion. Sinuses/Orbits: Paranasal sinuses and mastoid air cells are clear. Bilateral lens replacement. Otherwise the orbits are  unremarkable. Other: None. CT CERVICAL SPINE FINDINGS Alignment: Normal. Skull base and vertebrae: Multilevel moderate severe degenerative changes of the spine with associated posterior disc osteophyte complex formation at the C3-C4 level and C4-C5 level. Possible smaller posterior disc osteophyte complex formation at the C6-C7 level. No associated severe osseous neural foraminal or central canal stenosis. No acute fracture. No aggressive appearing focal osseous lesion or focal pathologic process. Soft tissues and spinal canal: No prevertebral fluid or swelling. No visible canal hematoma. Upper chest: Unremarkable. Other: None. IMPRESSION: 1. No acute intracranial abnormality. 2. No acute displaced fracture or traumatic listhesis of the cervical spine. Electronically Signed   By: Iven Finn M.D.   On: 09/07/2022 17:32   CT Cervical Spine Wo Contrast  Result Date: 09/04/2022 CLINICAL DATA:  Neck trauma (Age >= 65y);  Head trauma, minor (Age >= 65y) EXAM: CT HEAD WITHOUT CONTRAST CT CERVICAL SPINE WITHOUT CONTRAST TECHNIQUE: Multidetector CT imaging of the head and cervical spine was performed following the standard protocol without intravenous contrast. Multiplanar CT image reconstructions of the cervical spine were also generated. RADIATION DOSE REDUCTION: This exam was performed according to the departmental dose-optimization program which includes automated exposure control, adjustment of the mA and/or kV according to patient size and/or use of iterative reconstruction technique. COMPARISON:  CT head and C-spine 07/20/2022 FINDINGS: CT HEAD FINDINGS Brain: Cerebral ventricle sizes are concordant with the degree of cerebral volume loss. 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 Chronic bilateral cerebellar infarctions. Patchy and confluent areas of decreased attenuation are noted throughout the deep and periventricular white matter of the cerebral hemispheres bilaterally, compatible with chronic microvascular ischemic disease. No evidence of large-territorial acute infarction. No parenchymal hemorrhage. No mass lesion. No extra-axial collection. No mass effect or midline shift. No hydrocephalus. Basilar cisterns are patent. Vascular: No hyperdense vessel. Skull: No acute fracture or focal lesion. Sinuses/Orbits: Paranasal sinuses and mastoid air cells are clear. Bilateral lens replacement. Otherwise the orbits are unremarkable. Other: None. CT CERVICAL SPINE FINDINGS Alignment: Normal. Skull base and vertebrae: Multilevel moderate severe degenerative changes of the spine with associated posterior disc osteophyte complex formation at the C3-C4 level and C4-C5 level. Possible smaller posterior disc osteophyte complex formation at the C6-C7 level. No associated severe osseous neural foraminal or central canal stenosis. No acute fracture. No aggressive appearing focal osseous  lesion or focal pathologic process. Soft tissues and spinal canal: No prevertebral fluid or swelling. No visible canal hematoma. Upper chest: Unremarkable. Other: None. IMPRESSION: 1. No acute intracranial abnormality. 2. No acute displaced fracture or traumatic listhesis of the cervical spine. Electronically Signed   By: Iven Finn M.D.   On: 09/10/2022 17:32   CT CHEST ABDOMEN PELVIS W CONTRAST  Result Date: 09/28/2022 CLINICAL DATA:  Sepsis.  Fall.  Unknown time down. EXAM: CT CHEST, ABDOMEN, AND PELVIS WITH CONTRAST TECHNIQUE: Multidetector CT imaging of the chest, abdomen and pelvis was performed following the standard protocol during bolus administration of intravenous contrast. RADIATION DOSE REDUCTION: This exam was performed according to the departmental dose-optimization program which includes automated exposure control, adjustment of the mA and/or kV according to patient size and/or use of iterative reconstruction technique. CONTRAST:  29m OMNIPAQUE IOHEXOL 350 MG/ML SOLN COMPARISON:  X-ray thoracic and lumbar spine 03/31/2020 FINDINGS: CHEST: Cardiovascular: No aortic injury. The thoracic aorta is normal in caliber. The heart is enlarged in size. No significant pericardial effusion. Atherosclerotic plaque. Four-vessel coronary calcification. Mediastinum/Nodes: No pneumomediastinum. No mediastinal hematoma. The esophagus is unremarkable.  The thyroid is unremarkable. The central airways are patent. No mediastinal, hilar, or axillary lymphadenopathy. Lungs/Pleura: Peribronchovascular ground-glass airspace opacities of the lingula and bilateral lower lobes. No pulmonary nodule. No pulmonary mass. No pulmonary contusion or laceration. No pneumatocele formation. Trace right pleural effusion. Possible tiny trace left pleural effusion. No pneumothorax. No hemothorax. Musculoskeletal/Chest wall: No chest wall mass. No acute rib or sternal fracture. Old healed sternal fracture. Old healed left rib  fractures. No spinal fracture. Mild to moderate degenerative changes of the shoulders, right greater than left. ABDOMEN / PELVIS: Hepatobiliary: Not enlarged. Heterogeneous appearance of the hepatic parenchyma likely due to streak artifact originating from the bilateral upper extremities along the patient's sides. No focal lesion. No laceration or subcapsular hematoma. Calcified gallstones within the gallbladder lumen. Suggestion of fundal gallbladder wall thickening/pericholecystic fluid. No biliary ductal dilatation. Pancreas: Normal pancreatic contour. No main pancreatic duct dilatation. Spleen: Not enlarged. No focal lesion. No laceration, subcapsular hematoma, or vascular injury. Adrenals/Urinary Tract: No nodularity bilaterally. Bilateral kidneys enhance symmetrically. No hydronephrosis. No contusion, laceration, or subcapsular hematoma. Fluid dense lesion of the right kidney likely represents a simple renal cyst. Simple renal cysts, in the absence of clinically indicated signs/symptoms, require no independent follow-up. No injury to the vascular structures or collecting systems. No hydroureter. The urinary bladder is unremarkable. On delayed imaging, there is no urothelial wall thickening and there are no filling defects in the opacified portions of the bilateral collecting systems or ureters. Stomach/Bowel: No small or large bowel wall thickening or dilatation. Colonic diverticulosis. The appendix is unremarkable. Vasculature/Lymphatics: Severe atherosclerotic plaque. No abdominal aorta or iliac aneurysm. No active contrast extravasation or pseudoaneurysm. No abdominal, pelvic, inguinal lymphadenopathy. Reproductive: Prostate is borderline enlarged in size. Partially visualized at least small to moderate volume right hydrocele. Other: No simple free fluid ascites. No pneumoperitoneum. No hemoperitoneum. No mesenteric hematoma identified. No organized fluid collection. Musculoskeletal: No significant soft  tissue hematoma. No acute pelvic fracture. No acute spinal fracture. Chronic compression fracture of the L1 and L4 vertebral bodies grossly stable in appearance. Grade 1 anterolisthesis of L3 on L4 and L4 on L5. Multilevel degenerative changes of the spine. Ports and Devices: None. IMPRESSION: 1. Peribronchovascular ground-glass airspace opacities of the lingula and bilateral lower lobes. Findings suggestive of infection/inflammation and consistent with chest x-ray. Followup PA and lateral chest X-ray is recommended in 3-4 weeks following therapy to ensure resolution. 2. Trace right pleural effusion. Possible tiny trace left pleural effusion. 3. Cholelithiasis with suggestion of fundal gallbladder wall thickening/pericholecystic fluid. Correlate with liver function tests. Consider right upper quadrant ultrasound for a more sensitive evaluation of the gallbladder if clinically indicated. 4. No acute traumatic injury to the chest, abdomen, or pelvis. 5. No acute fracture or traumatic malalignment of the thoracic or lumbar spine. 6. Other imaging findings of potential clinical significance: Colonic diverticulosis with no acute diverticulitis. Cardiomegaly. Borderline prostatomegaly. Partially visualized at least small to moderate volume right hydrocele .Aortic Atherosclerosis (ICD10-I70.0) -including four-vessel coronary artery calcification. Electronically Signed   By: Iven Finn M.D.   On: 09/20/2022 17:26   DG Chest Port 1 View  Result Date: 09/04/2022 CLINICAL DATA:  Shortness of breath and weakness. EXAM: PORTABLE CHEST 1 VIEW COMPARISON:  07/20/2022 FINDINGS: Patient is slightly rotated to the right. Lungs are adequately inflated demonstrate subtle hazy prominence of the pulmonary vasculature as well as minimal bibasilar density. Findings likely due to mild interstitial edema/vascular congestion. Not exclude infection in the lung bases. Stable cardiomegaly. Metallic device projects  over the anterior left  chest wall unchanged. Remainder of the exam is unchanged. IMPRESSION: 1. Findings suggesting mild interstitial edema/vascular congestion. Infection in the lung bases is possible. 2. Stable cardiomegaly. Electronically Signed   By: Marin Olp M.D.   On: 09/08/2022 15:35    Labs:  CBC: Recent Labs    07/24/22 0908 09/27/2022 1520 08/31/2022 1547 09/06/22 0926 09/07/22 0232  WBC 4.6 3.8*  --  10.8* 7.3  HGB 10.0* 11.1* 12.2*  12.2* 9.5* 10.4*  HCT 31.2* 36.7* 36.0*  36.0* 30.3* 33.5*  PLT 167 149*  --  140* 144*    COAGS: Recent Labs    09/17/21 1317 09/18/21 0147 06/10/22 1547 09/06/22 0926 09/06/22 1238  INR 1.5* 1.4* 1.3*  --   --   APTT  --   --   --  176* >200*    BMP: Recent Labs    09/26/2022 1520 09/10/2022 1547 09/06/22 0926 09/07/22 0232 09/07/22 0837  NA 140 143  144 139 140 140  K 3.3* 3.7  3.7 3.2* 3.7 3.7  CL 106 103 108 109 107  CO2 26  --  '25 24 22  '$ GLUCOSE 152* 153* 90 89 91  BUN 8 8 6* 8 8  CALCIUM 8.8*  --  7.9* 8.2* 8.3*  CREATININE 1.09 1.00 0.84 0.94 0.87  GFRNONAA >60  --  >60 >60 >60    LIVER FUNCTION TESTS: Recent Labs    09/18/21 0147 07/23/22 0259 09/08/2022 1520 09/07/22 0232  BILITOT 2.5* 2.2* 1.8* 2.1*  AST 42* 20 64* 33  ALT 33 19 48* 30  ALKPHOS 86 67 144* 110  PROT 6.6 6.1* 6.9 5.9*  ALBUMIN 3.5 2.8* 3.3* 2.7*    Assessment and Plan:  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 Estanislado Pandy.   MRA/Brain done yesterday which shows:  IMPRESSION: 1. Multiple areas of acute infarct involving the cerebellum bilaterally, left pons, and right frontal white matter. 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 Large area of chronic infarct and mild chronic hemorrhage and in the right MCA territory. 2. 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 Right vertebral artery patent. Left vertebral artery ends in PICA  He is awake and  following some commands per bedside RN. He did not follow commands for me during my assessment. He is moving his right side very well with limited movement on the left side. Currently weaning on the ventilator.   He is off IV heparin and is only receiving aspirin right now. Eliquis to be resumed in a few days per Neurology note.   Please call IR with any questions.   Electronically Signed: Soyla Dryer, AGACNP-BC [phone] 09/07/2022, 3:03 PM   I spent a total of 15 Minutes at the the patient's bedside AND on the patient's hospital floor or unit, greater than 50% of which was counseling/coordinating care for post thrombectomy.

## 2022-09-07 NOTE — Progress Notes (Signed)
Initial Nutrition Assessment  DOCUMENTATION CODES:   Non-severe (moderate) malnutrition in context of chronic illness  INTERVENTION:   Initiate tube feeding via Cortrak tube: Osmolite 1.5 at 20 ml/h and increase by 10 ml every 8 hours to goal rate of 50 ml/hr (1200 ml per day) Prosource TF20 60 ml BID  Provides 1960 kcal, 115 gm protein, 912 ml free water daily   NUTRITION DIAGNOSIS:   Moderate Malnutrition related to chronic illness (COPD/CHF) as evidenced by moderate fat depletion, moderate muscle depletion.  GOAL:   Patient will meet greater than or equal to 90% of their needs  MONITOR:   TF tolerance  REASON FOR ASSESSMENT:   Consult Enteral/tube feeding initiation and management  ASSESSMENT:   Pt with PMH of HTN, HLD, AF on Eliquis, CHF with EF 25-30%, CAD s/p stent, CVA with L-sided deficits, COPD, DM admitted with bilateral large cerebellar infarct s/p IR revascularization.   No family present at time of visit this am.  Pt did wake to voice but was not able to answer any questions. Per MD pt is following commands on vent.  Per neurology pt with recent L hip fx using walker at home.   1/10 - s/p cortrak placement; per xray tip mid-body of stomach   Medications reviewed and include: colace, SSI, protonix, miralax  NS @ 75 ml/hr  Labs reviewed    NUTRITION - FOCUSED PHYSICAL EXAM:  Flowsheet Row Most Recent Value  Orbital Region Moderate depletion  Upper Arm Region Moderate depletion  Thoracic and Lumbar Region No depletion  Buccal Region Moderate depletion  Temple Region Moderate depletion  Clavicle Bone Region Severe depletion  Clavicle and Acromion Bone Region Severe depletion  Scapular Bone Region Severe depletion  Dorsal Hand Unable to assess  [edema]  Patellar Region No depletion  Anterior Thigh Region No depletion  Posterior Calf Region No depletion  [edema]  Edema (RD Assessment) Moderate  Hair Reviewed  Eyes Reviewed  Mouth Unable to  assess  Skin Reviewed  Nails Reviewed       Diet Order:   Diet Order             Diet NPO time specified  Diet effective now                   EDUCATION NEEDS:   Not appropriate for education at this time  Skin:  Skin Assessment: Reviewed RN Assessment (R venous stasis ulcer)  Last BM:  unknown  Height:   Ht Readings from Last 1 Encounters:  09/06/22 '5\' 9"'$  (1.753 m)    Weight:   Wt Readings from Last 1 Encounters:  09/06/22 75.7 kg    BMI:  Body mass index is 24.65 kg/m.  Estimated Nutritional Needs:   Kcal:  1800-2000  Protein:  110-120 grams  Fluid:  >1.8 L/day  Lockie Pares., RD, LDN, CNSC See AMi[facility] for contact information

## 2022-09-07 NOTE — Progress Notes (Signed)
NAME:  Vincent [name], MRN:  [id number], DOB:  April 12, 1936, LOS: 2 ADMISSION DATE:  08/31/2022, CONSULTATION DATE:  09/06/2022 REFERRING MD:  Marjory Lies - NIR CHIEF COMPLAINT:  S/p basilar artery occlusion, vent Management   History of Present Illness:  87 year old man who presented to [facility] ED 1/8 as a Code Stroke after being found down at home. Presented with R gaze preference, L-sided deficits and dysarthria. PMHx significant for HTN, HLD, AF (on Eliquis), CHF (EF 25-30%), CAD s/p stent (2004), prior CVA with residual L-sided deficits, COPD, T2DM.  CTA Head/Neck demonstrated distal basilar artery occlusion, left V4 occlusion. He was seen by neurology and IR and was taken to IR for revascularization of distal basilar artery; TICI 2C revascularization achieved. Post-procedure CT negative for hemorrhage.  Post-procedure, he was left intubated and was transferred to ICU.   PCCM was asked to see for vent management.  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 s/p remote stent to Cx 2004 with chronic stable angina in context of residual circumflex disease.   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  Interim History / Subjective:  Off sedation Weaning on ventilator and following commands but still significantly fatigued and falling asleep  Objective:  Blood pressure 128/67, pulse 68, temperature 98.8 F (37.1 C), temperature source Axillary, resp. rate 15, height '5\' 9"'$  (1.753 m), weight 75.7 kg, SpO2 100 %.    Vent Mode: PRVC FiO2 (%):  [40 %] 40 % Set Rate:  [15 bmp] 15 bmp Vt Set:  [500 mL-560 mL] 560 mL PEEP:  [5 cmH20] 5 cmH20 Plateau Pressure:  [12 cmH20-14 cmH20] 14 cmH20   Intake/Output Summary (Last 24 hours) at 09/07/2022 0924 Last data filed at 09/07/2022 0800 Gross per 24 hour  Intake 1507.19 ml  Output 945 ml  Net 562.19 ml    Filed Weights   09/06/22 0200 09/06/22 0432  Weight: 84.2 kg 75.7 kg   Physical Examination: General: Acutely ill-appearing elderly man in NAD. Intubated, lightly sedated. HEENT: [place]/AT, anicteric sclera, PERRL 34m, moist mucous membranes. ETT/OGT. Neuro: Sedated. Opens eyes to repetitive verbal stimuli/tactile and noxious stimuli. Following commands intermittently with facial expressions, lower extremities, less response in upper extremities. Moves BLE to command. +Corneal, +Cough, and +Gag  CV: Irregularly irregular rhythm, no m/g/r. PULM: Breathing even and unlabored on vent (PEEP 5, FiO2 40%). Lung fields coarse on R, clear on L. GI: Soft, nontender, nondistended. Normoactive bowel sounds. Extremities: No significant LE edema noted.  Skin: Warm/dry, no rashes, venous stasis changes of BLE.  Labs/imaging personally reviewed:  CTA head 1/8 > basilar artery occlusion, stenosis of right M2 and M3 branches, stenosis bilateral carotid siphons. MRI brain 1/9 >  Echo 1/9 >   Assessment & Plan:   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 CTA Head/Neck demonstrated distal basilar artery  occlusion, left V4 occlusion. Taken to IR for revascularization of distal basilar artery; TICI 2C revascularization. Post-procedure CT negative for hemorrhage. - Stroke team primary - Post-procedure management per IR - F/u MRI Brain - Additional imaging per Neuro - Goal SBP 120-140 - off cleviprex - Echo with EF 20-25% similar to prior - Neuroprotective measures: HOB > 30 degrees, normoglycemia, normothermia, electrolytes WNL  Respiratory insufficiency - due to inability to protect the airway in the setting of above. Hx COPD -mental status improving, but remains the barrier to extubation -Maintain full vent support with SAT/SBT as tolerated -titrate Vent setting to maintain SpO2 greater than or equal to 90%. -HOB elevated 30 degrees. -Plateau pressures less than 30 cm H20.  -Follow chest x-ray, ABG prn.   -Bronchial hygiene and RT/bronchodilator protocol.   Hx A.fib on Eliquis, sCHF (echo from Aug 2023 with EF 25%-30%) CAD s/p stenting HTN HLD HFrEF - Heparin gtt held by neuro -Echo with EF 25-30% similar to prior - BP management as above - Continue statin, resume digoxin as appropriate - Hold home Lasix, spironolactone, Imdur, Entresto, Coreg; resume as clinically appropriate  Hx DM Hemoglobin A1C 6.5% - SSI - CBGs Q4H - Goal CBG 140-180 - Hold home medications  Best practice (evaluated daily):  Diet/type: NPO, start TF if mental status does not improve DVT prophylaxis: systemic heparin GI prophylaxis: PPI Lines: Arterial Line Foley:  N/A Code Status:  full code Last date of multidisciplinary goals of care discussion: Pending, will update sister  Critical care time: 35 minutes   The patient is critically ill with multiple organ system failure and requires high complexity decision making for assessment and support, frequent evaluation and titration of therapies, advanced monitoring, review of radiographic studies and interpretation of complex data.   Critical Care  Time devoted to patient care services, exclusive of separately billable procedures, described in this note is 41 minutes.  Otilio Carpen Arvetta Araque, PA-C [facility] 09/07/22 9:24 AM  Please see [email or web address] for pager details.  From 7A-7P if no response, please call [phone] After hours, please call ELink [phone]

## 2022-09-07 NOTE — Progress Notes (Addendum)
[facility] Progress Note Patient Name: Vincent [name] DOB: 02/20/1936 MRN: [id number]   Date of Service  09/07/2022  HPI/Events of Note  Bradycardia - HR 47-51. Now off of a Propofol IV infusion. Digoxin level = 0.3 (subtherapeutic). Therefore, Digoxin toxicity is not the etiology of the bradycardia. CNS etiology vs Propofol?  eICU Interventions  Continue present management.      Intervention Category Major Interventions: Arrhythmia - evaluation and management  Hindy Perrault [place] 09/07/2022, 4:40 AM

## 2022-09-07 NOTE — Progress Notes (Signed)
[facility] Progress Note Patient Name: Vincent [name] DOB: 12/20/1935 MRN: [id number]   Date of Service  09/07/2022  HPI/Events of Note  Bradycardia - HR = 47. Patient is currently on Digoxin and Propofol IV infusion. He was on Coreg at home which appears not to have been ordered on his admission.   eICU Interventions  Plan: Digoxin level STAT.  D/C Propofol IV infusion.  Fentanyl IV infusion. Titrate to RASS = 0 to -1.      Intervention Category Major Interventions: Arrhythmia - evaluation and management  Nioka Thorington [place] 09/07/2022, 1:23 AM

## 2022-09-07 NOTE — Evaluation (Signed)
Physical Therapy Evaluation Patient Details Name: Vincent [name] MRN: [id number] DOB: Mar 27, 1936 Today's Date: 09/07/2022  History of Present Illness  Pt is an 87 y/o male presenting 1/9 to the ED via EMS having been found down by his sister for an unknown time.  MRI showed multiple areas of acute infarct involving the cerebellum bil, left pons and right frontal white matter.  Also showed chronic infarcts in the cerebellum bil and in th R MCA territory.  1/9 In IR s/p complete revascularization of the distal basilar artery.  PMHx:alcoholic cardiomyopathy, chronic sCHF, COPD, CAD, L foot drop, gait d/o, Gout, [place] 2017, with residual weakness/altered sensation, HTN, afib,  Clinical Impression  Pt admitted with/for fall and found to have suffered stroke.  Pt is intubated presently and eval was limited to transition to/from EOB with mod/max assist   Pt currently limited functionally due to the problems listed. ( See problems list.)   Pt will benefit from PT to maximize function and safety in order to get ready for next venue listed below.        Recommendations for follow up therapy are one component of a multi-disciplinary discharge planning process, led by the attending physician.  Recommendations may be updated based on patient status, additional functional criteria and insurance authorization.  Follow Up Recommendations Acute inpatient rehab (3hours/day)      Assistance Recommended at Discharge Frequent or constant Supervision/Assistance  Patient can return home with the following  A little help with walking and/or transfers;A little help with bathing/dressing/bathroom;Assistance with cooking/housework;Assist for transportation;Help with stairs or ramp for entrance    Equipment Recommendations    Recommendations for Other Services  Rehab consult    Functional Status Assessment Patient has had a recent decline in their functional status and demonstrates the ability to make significant  improvements in function in a reasonable and predictable amount of time.     Precautions / Restrictions Precautions Precautions: Fall      Mobility  Bed Mobility Overal bed mobility: Needs Assistance Bed Mobility: Supine to Sit, Sit to Supine     Supine to sit: Mod assist Sit to supine: Max assist   General bed mobility comments: Difficult due to lines/tubes/trach.  +2 not available    Transfers                   General transfer comment: NT, no additional assist available    Ambulation/Gait               General Gait Details: NT  Stairs            Wheelchair Mobility    Modified [name] (Stroke Patients Only) Modified [name] (Stroke Patients Only) Pre-Morbid [name] Score: No significant disability Modified [name]: Severe disability     Balance Overall balance assessment: Needs assistance Sitting-balance support: Bilateral upper extremity supported, Single extremity supported, Feet supported Sitting balance-Leahy Scale: Poor Sitting balance - Comments: pt consistently biased posteriorly and unable to maintain upright even with Bil UE's                                     Pertinent Vitals/Pain Pain Assessment Pain Assessment: No/denies pain Faces Pain Scale: No hurt Pain Intervention(s): Monitored during session    Home Living Family/patient expects to be discharged to:: Private residence Living Arrangements: Alone Available Help at Discharge: Family;Available PRN/intermittently Type of Home: House Home Access: Stairs to enter Entrance  Stairs-Rails: None Entrance Stairs-Number of Steps: 3   Home Layout: One level Home Equipment: Cane - single point Additional Comments: Sister lives [place] the same subdivision - by car 5 minutes and walking 15 minutes apart. Sister drives. Pt manages all medical care.    Prior Function Prior Level of Function : Independent/Modified Independent             Mobility Comments: the cane  and walker were given to sister and he has access but they are not the patients. 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 email and phone contacts. Sister reports pt stopped driving due to vision but has a full license if he wanted to drive     Hand Dominance   Dominant Hand: Right    Extremity/Trunk Assessment   Upper Extremity Assessment Upper Extremity Assessment: Generalized weakness    Lower Extremity Assessment Lower Extremity Assessment: Generalized weakness       Communication   Communication: No difficulties;Other (comment) (presently intubated)  Cognition Arousal/Alertness: Awake/alert Behavior During Therapy: WFL for tasks assessed/performed Overall Cognitive Status: Difficult to assess (pt  nods he's thinking clearly, answered yes/no questions well, still difficult to assess.)                                          General Comments General comments (skin integrity, edema, etc.): vss on Vent    Exercises Other Exercises Other Exercises: bil hip/knee flex/ext AROM with graded resistance x10 reps Other Exercises: bicep/tricep presses x10 reps bil with graded assist/resistance.   Assessment/Plan    PT Assessment Patient needs continued PT services  PT Problem List Decreased strength;Decreased activity tolerance;Decreased range of motion;Decreased balance;Decreased mobility       PT Treatment Interventions Gait training;Functional mobility training;Therapeutic activities;Therapeutic exercise;Balance training;Neuromuscular re-education;Patient/family education;DME instruction    PT Goals (Current goals can be found in the Care Plan section)  Acute Rehab PT Goals PT Goal Formulation: Patient unable to participate in goal setting Time For Goal Achievement: 09/21/22 Potential to Achieve Goals: Good    Frequency Min 3X/week     Co-evaluation               AM-PAC PT "6 Clicks"  Mobility  Outcome Measure Help needed turning from your back to your side while in a flat bed without using bedrails?: A Lot Help needed moving from lying on your back to sitting on the side of a flat bed without using bedrails?: A Lot Help needed moving to and from a bed to a chair (including a wheelchair)?: Total Help needed standing up from a chair using your arms (e.g., wheelchair or bedside chair)?: Total Help needed to walk in hospital room?: Total Help needed climbing 3-5 steps with a railing? : Total 6 Click Score: 8    End of Session Equipment Utilized During Treatment: Oxygen Activity Tolerance: Patient tolerated treatment well;Patient limited by fatigue Patient left: in bed;with call bell/phone within reach;with bed alarm set Nurse Communication: Mobility status PT Visit Diagnosis: Muscle weakness (generalized) (M62.81);Other abnormalities of gait and mobility (R26.89)    Time: 1530-1555 PT Time Calculation (min) (ACUTE ONLY): 25 min   Charges:   PT Evaluation $PT Eval Moderate Complexity: 1 Mod PT Treatments $Therapeutic Activity: 8-22 mins        09/07/2022  Ginger Carne., PT Acute Rehabilitation Services [phone]  (office)  Tessie Fass Jabrea Kallstrom 09/07/2022, 5:12 PM

## 2022-09-07 NOTE — Progress Notes (Signed)
STROKE TEAM PROGRESS NOTE   INTERVAL HISTORY No family is at the bedside. Pt still intubated and eyes closed but following commands in hands and feet. MRI showed large b/l cerebellar infarcts and also left pontine infarcts. Off heparin IV, on ASA now.   Vitals:   09/07/22 1000 09/07/22 1100 09/07/22 1152 09/07/22 1200  BP: (!) 133/51 126/80  135/60  Pulse: (!) 49 (!) 46 (!) 44 (!) 44  Resp: '16 15 14 14  '$ Temp:    99.3 F (37.4 C)  TempSrc:    Axillary  SpO2: 100% 100% 100% 100%  Weight:      Height:       CBC:  Recent Labs  Lab 09/24/2022 1520 09/04/2022 1547 09/06/22 0926 09/07/22 0232  WBC 3.8*  --  10.8* 7.3  NEUTROABS 2.9  --  9.8*  --   HGB 11.1*   < > 9.5* 10.4*  HCT 36.7*   < > 30.3* 33.5*  MCV 88.2  --  85.8 85.7  PLT 149*  --  140* 144*   < > = values in this interval not displayed.   Basic Metabolic Panel:  Recent Labs  Lab 09/06/22 0926 09/07/22 0232 09/07/22 0837  NA 139 140 140  K 3.2* 3.7 3.7  CL 108 109 107  CO2 '25 24 22  '$ GLUCOSE 90 89 91  BUN 6* 8 8  CREATININE 0.84 0.94 0.87  CALCIUM 7.9* 8.2* 8.3*  MG 1.8 2.2  --   PHOS  --  3.2  --    Lipid Panel:  Recent Labs  Lab 09/06/22 0211 09/07/22 0232  CHOL 129  --   TRIG 75 81  HDL 58  --   CHOLHDL 2.2  --   VLDL 15  --   LDLCALC 56  --    HgbA1c:  Recent Labs  Lab 09/06/22 0211  HGBA1C 6.5*   Urine Drug Screen: No results for input(s): "LABOPIA", "COCAINSCRNUR", "LABBENZ", "AMPHETMU", "THCU", "LABBARB" in the last 168 hours.  Alcohol Level No results for input(s): "ETH" in the last 168 hours.  IMAGING past 24 hours DG Abd Portable 1V  Result Date: 09/07/2022 CLINICAL DATA:  Tube placement EXAM: PORTABLE ABDOMEN - 1 VIEW COMPARISON:  09/06/2022 FINDINGS: Limited portable view of the upper abdomen demonstrates Dobbhoff tube with tip overlying the stomach. This could be advanced further. There is some gas seen in nondilated loops of bowel elsewhere in the abdomen. Preserved multiple stones  in the gallbladder in the right upper quadrant. IMPRESSION: Limited x-ray for tube placement demonstrates Dobbhoff tube overlying the midbody of the stomach Electronically Signed   By: Jill Side M.D.   On: 09/07/2022 10:10   Portable Chest xray  Result Date: 09/07/2022 CLINICAL DATA:  Respiratory failure EXAM: PORTABLE CHEST 1 VIEW COMPARISON:  Prior chest x-ray 09/12/2022 FINDINGS: Stable significant enlargement of the cardiopericardial silhouette. Atherosclerotic calcifications are present in the transverse aorta. The patient is intubated. The tip of the endotracheal tube is 7.3 cm above the carina. A gastric tube is present. The proximal side-hole projects over the GE junction. An implantable loop recorder projects over the left chest. Pulmonary vascular congestion without overt edema. Low lung volumes with bibasilar atelectasis. No pneumothorax. IMPRESSION: 1. Stable cardiomegaly and pulmonary vascular congestion without overt pulmonary edema. 2. Bibasilar atelectasis. 3. Intubated. The tip of the endotracheal tube is 7.3 cm above the carina. 4. Gastric tube is present. The proximal side-hole projects over the GE junction. Electronically Signed   By:  Jacqulynn Cadet M.D.   On: 09/07/2022 07:53    PHYSICAL EXAM  General - Well nourished, well developed, intubated off sedation.   Ophthalmologic - fundi not visualized due to noncooperation.   Cardiovascular - irregularly irregular heart rate and rhythm.   Neuro - intubated off propofol, eyes closed, but following commands.  With forced eye opening, eyes in mid position, not blinking to visual threat, not tracking, pupils 72m and reactive to light. Corneal reflex present, gag and cough present. Breathing over the vent.   Facial symmetry not able to test due to ET tube.  Tongue protrusion not cooperative. Follows simple commands and against gravity BUEs, wiggle toes on b/l feet. DTR 1+ and no babinski. Sensation, coordination not cooperative and  gait not tested.     ASSESSMENT/PLAN [name] a 87y.o. male with history of  alcoholic cardiomyopathy, permanent atrial fibrillation on Eliquis, CHF with an EF 25 to 30% with global hypokinesis, CAD s/p stenting, DM2, prior stroke, chronic bilateral lower extremity wounds with edema, LLE peroneal neuropathy with chronic foot drop, COPD, HLD and HTN who presented to the ED via EMS this afternoon after he was found down.  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  CTA head & neck Occlusion of the left V4 segment and poor opacification of the basilar artery with apparent occlusion distally, age indeterminate. The bilateral PCAs are supplied by posterior communicating arteries with likely retrograde flow into the P1 segments.Multifocal high-grade stenosis of the right M2 and M3 branches   S/p IR with TICI2c reperfusion MRI - Multiple areas of acute infarct involving the cerebellum bilaterally, left pons, and right frontal white matter. 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 Large area of chronic infarct and mild chronic hemorrhage and in the right MCA territory.  MRA  - Basilar artery is patent now.   2D Echo EF 20-25%  LDL 56 HgbA1c 6.5 VTE prophylaxis - Heparin subq Eliquis (apixaban) daily prior to admission, now off heparin IV on ASA for now. Once stable in 3-5 days, [facility]:  Pending Disposition:  Pending  Chronic Atrial Fibrillation  Home med: Eliquis Rate controlled Off heparin IV on aspirin for now.  Once stable in 3 to 5 days will resume AC.  Congestive heart failure Alcoholic cardiomyopathy 89/2957EF 25 to 30%.  This admission EF 20 to 25%. Home meds including Imdur, digoxin, Jardiance, lasix On digoxin CCM  managing Will consider cardiology consult if needed. Pt follows with Dr. MAundra Dubinas outpt  Hypertension Home meds:  Lasix, coreg, aldactone Stable BP goal < 180/105 Long-term BP goal normotensive  Post operative respiratory insufficiency  Remains intubated post procedure CCM to manage ventilator settings Hx of COPD Extubate as able  Hyperlipidemia Home meds:  Atorvastatin '80mg'$   LDL 56, goal < 70 AST/ALT 64/48-> 33/30 Resume lipitor 80 Continue statin at discharge  Diabetes type II Controlled Home meds:  Jardiance, metformin HgbA1c 6.5, goal < 7.0 CBGs SSI  Other Stroke Risk Factors Advanced Age >/= 636 Hx stroke/TIA Evidence of chronic infarcts on imaging Coronary artery disease status post stenting patient  Other Active Problems Chronic BLE wound Chronic b/l foot drop Recent left hip fracture - walking with walker at home Leukopenia WBC 3.8->7.3  Hypokalemia K 3.2 -> 3.7   Hospital day # 2   Rosalin Hawking, MD PhD Stroke Neurology 09/07/2022 1:56 PM  This patient is critically ill due to cerebellum and pontine infarct due to bilateral occlusion, status post thrombectomy, chronic A-fib, severe cardiomyopathy and at significant risk of neurological worsening, death form recurrent stroke, hemorrhagic transformation, hydrocephalus, heart failure, cardiac rest, seizure. This patient's care requires constant monitoring of vital signs, hemodynamics, respiratory and cardiac monitoring, review of multiple databases, neurological assessment, discussion with family, other specialists and medical decision making of high complexity. I spent 40 minutes of neurocritical care time in the care of this patient. Discussed with Dr. Lynetta Mare.      To contact Stroke Continuity provider, please refer to [email or web address] After hours, contact General Neurology

## 2022-09-08 ENCOUNTER — Inpatient Hospital Stay (HOSPITAL_COMMUNITY): Payer: Medicare Other

## 2022-09-08 DIAGNOSIS — I639 Cerebral infarction, unspecified: Secondary | ICD-10-CM | POA: Diagnosis not present

## 2022-09-08 DIAGNOSIS — I482 Chronic atrial fibrillation, unspecified: Secondary | ICD-10-CM | POA: Diagnosis not present

## 2022-09-08 DIAGNOSIS — I6340 Cerebral infarction due to embolism of unspecified cerebral artery: Secondary | ICD-10-CM | POA: Diagnosis not present

## 2022-09-08 DIAGNOSIS — G9340 Encephalopathy, unspecified: Secondary | ICD-10-CM

## 2022-09-08 DIAGNOSIS — I255 Ischemic cardiomyopathy: Secondary | ICD-10-CM | POA: Diagnosis not present

## 2022-09-08 DIAGNOSIS — I651 Occlusion and stenosis of basilar artery: Secondary | ICD-10-CM | POA: Diagnosis not present

## 2022-09-08 LAB — CBC
HCT: 33.3 % — ABNORMAL LOW (ref 39.0–52.0)
HCT: 28.3 % — ABNORMAL LOW (ref 39.0–52.0)
Hemoglobin: 9.9 g/dL — ABNORMAL LOW (ref 13.0–17.0)
Hemoglobin: 8.8 g/dL — ABNORMAL LOW (ref 13.0–17.0)
MCH: 26.3 pg (ref 26.0–34.0)
MCH: 26.9 pg (ref 26.0–34.0)
MCHC: 29.7 g/dL — ABNORMAL LOW (ref 30.0–36.0)
MCHC: 31.1 g/dL (ref 30.0–36.0)
MCV: 88.6 fL (ref 80.0–100.0)
MCV: 86.5 fL (ref 80.0–100.0)
Platelets: 147 10*3/uL — ABNORMAL LOW (ref 150–400)
Platelets: 145 10*3/uL — ABNORMAL LOW (ref 150–400)
RBC: 3.76 MIL/uL — ABNORMAL LOW (ref 4.22–5.81)
RBC: 3.27 MIL/uL — ABNORMAL LOW (ref 4.22–5.81)
RDW: 18.6 % — ABNORMAL HIGH (ref 11.5–15.5)
RDW: 18.3 % — ABNORMAL HIGH (ref 11.5–15.5)
WBC: 5.6 10*3/uL (ref 4.0–10.5)
WBC: 5.3 10*3/uL (ref 4.0–10.5)
nRBC: 0 % (ref 0.0–0.2)
nRBC: 0 % (ref 0.0–0.2)

## 2022-09-08 LAB — TYPE AND SCREEN
ABO/RH(D): O POS
Antibody Screen: NEGATIVE

## 2022-09-08 LAB — BASIC METABOLIC PANEL
Anion gap: 7 (ref 5–15)
BUN: 15 mg/dL (ref 8–23)
CO2: 23 mmol/L (ref 22–32)
Calcium: 8.2 mg/dL — ABNORMAL LOW (ref 8.9–10.3)
Chloride: 111 mmol/L (ref 98–111)
Creatinine, Ser: 0.87 mg/dL (ref 0.61–1.24)
GFR, Estimated: >60 mL/min (ref >60)
Glucose, Bld: 170 mg/dL — ABNORMAL HIGH (ref 70–99)
Potassium: 3.6 mmol/L (ref 3.5–5.1)
Sodium: 141 mmol/L (ref 135–145)

## 2022-09-08 LAB — GLUCOSE, CAPILLARY
Glucose-Capillary: 135 mg/dL — ABNORMAL HIGH (ref 70–99)
Glucose-Capillary: 156 mg/dL — ABNORMAL HIGH (ref 70–99)
Glucose-Capillary: 175 mg/dL — ABNORMAL HIGH (ref 70–99)
Glucose-Capillary: 179 mg/dL — ABNORMAL HIGH (ref 70–99)
Glucose-Capillary: 183 mg/dL — ABNORMAL HIGH (ref 70–99)
Glucose-Capillary: 215 mg/dL — ABNORMAL HIGH (ref 70–99)

## 2022-09-08 LAB — PROTIME-INR
INR: 1.2 (ref 0.8–1.2)
Prothrombin Time: 14.9 seconds (ref 11.4–15.2)

## 2022-09-08 LAB — APTT: aPTT: 34 seconds (ref 24–36)

## 2022-09-08 LAB — MAGNESIUM
Magnesium: 2 mg/dL (ref 1.7–2.4)
Magnesium: 2 mg/dL (ref 1.7–2.4)

## 2022-09-08 LAB — PHOSPHORUS
Phosphorus: 2.3 mg/dL — ABNORMAL LOW (ref 2.5–4.6)
Phosphorus: 2.1 mg/dL — ABNORMAL LOW (ref 2.5–4.6)

## 2022-09-08 MED ORDER — POTASSIUM PHOSPHATES 15 MMOLE/5ML IV SOLN
15.0000 mmol | Freq: Once | INTRAVENOUS | Status: AC
  Administered 2022-09-08: 15 mmol via INTRAVENOUS
  Filled 2022-09-08: qty 5

## 2022-09-08 MED ORDER — FUROSEMIDE 10 MG/ML IJ SOLN
40.0000 mg | Freq: Every day | INTRAMUSCULAR | Status: DC
  Administered 2022-09-08 – 2022-09-18 (×11): 40 mg via INTRAVENOUS
  Filled 2022-09-08 (×11): qty 4

## 2022-09-08 MED ORDER — DEXMEDETOMIDINE HCL IN NACL 400 MCG/100ML IV SOLN
0.0000 ug/kg/h | INTRAVENOUS | Status: DC
  Administered 2022-09-08 – 2022-09-09 (×3): 0.4 ug/kg/h via INTRAVENOUS
  Administered 2022-09-10 – 2022-09-11 (×2): 0.2 ug/kg/h via INTRAVENOUS
  Administered 2022-09-12 – 2022-09-14 (×6): 0.4 ug/kg/h via INTRAVENOUS
  Administered 2022-09-15: 0.5 ug/kg/h via INTRAVENOUS
  Administered 2022-09-15: 0.6 ug/kg/h via INTRAVENOUS
  Administered 2022-09-16: 0.3 ug/kg/h via INTRAVENOUS
  Administered 2022-09-16: 0.7 ug/kg/h via INTRAVENOUS
  Administered 2022-09-17: 0.5 ug/kg/h via INTRAVENOUS
  Administered 2022-09-17 – 2022-09-18 (×2): 0.6 ug/kg/h via INTRAVENOUS
  Administered 2022-09-18 – 2022-09-19 (×2): 0.4 ug/kg/h via INTRAVENOUS
  Administered 2022-09-19: 0.7 ug/kg/h via INTRAVENOUS
  Filled 2022-09-08 (×21): qty 100

## 2022-09-08 MED ORDER — POTASSIUM CHLORIDE 20 MEQ PO PACK
20.0000 meq | PACK | Freq: Once | ORAL | Status: AC
  Administered 2022-09-08: 20 meq
  Filled 2022-09-08: qty 1

## 2022-09-08 NOTE — Progress Notes (Signed)
STROKE TEAM PROGRESS NOTE   INTERVAL HISTORY CCM PA and RN are at the bedside. Pt still intubated and on vent. Per RN, pt did not tolerate SBT this am due to agitation and tachycardia. Now back to vent, no sedation. Pt eyes open on voice, following commands.   Vitals:   09/08/22 0600 09/08/22 0700 09/08/22 0800 09/08/22 0900  BP: (!) 142/62 (!) 144/66 135/70 131/65  Pulse: 61 (!) 54 60 (!) 53  Resp: (!) 0 13 15 (!) 9  Temp:   98.8 F (37.1 C)   TempSrc:   Axillary   SpO2: 100% 100% 100% 100%  Weight:      Height:       CBC:  Recent Labs  Lab 09/14/2022 1520 09/16/2022 1547 09/06/22 0926 09/07/22 0232 09/08/22 0551  WBC 3.8*  --  10.8* 7.3 5.6  NEUTROABS 2.9  --  9.8*  --   --   HGB 11.1*   < > 9.5* 10.4* 9.9*  HCT 36.7*   < > 30.3* 33.5* 33.3*  MCV 88.2  --  85.8 85.7 88.6  PLT 149*  --  140* 144* 147*   < > = values in this interval not displayed.   Basic Metabolic Panel:  Recent Labs  Lab 09/07/22 0837 09/07/22 1648 09/08/22 0551  NA 140  --  141  K 3.7  --  3.6  CL 107  --  111  CO2 22  --  23  GLUCOSE 91  --  170*  BUN 8  --  15  CREATININE 0.87  --  0.87  CALCIUM 8.3*  --  8.2*  MG  --  2.0 2.0  PHOS  --  3.2 2.3*   Lipid Panel:  Recent Labs  Lab 09/06/22 0211 09/07/22 0232  CHOL 129  --   TRIG 75 81  HDL 58  --   CHOLHDL 2.2  --   VLDL 15  --   LDLCALC 56  --    HgbA1c:  Recent Labs  Lab 09/06/22 0211  HGBA1C 6.5*   Urine Drug Screen: No results for input(s): "LABOPIA", "COCAINSCRNUR", "LABBENZ", "AMPHETMU", "THCU", "LABBARB" in the last 168 hours.  Alcohol Level No results for input(s): "ETH" in the last 168 hours.  IMAGING past 24 hours DG Abd Portable 1V  Result Date: 09/07/2022 CLINICAL DATA:  Tube placement EXAM: PORTABLE ABDOMEN - 1 VIEW COMPARISON:  09/06/2022 FINDINGS: Limited portable view of the upper abdomen demonstrates Dobbhoff tube with tip overlying the stomach. This could be advanced further. There is some gas seen in  nondilated loops of bowel elsewhere in the abdomen. Preserved multiple stones in the gallbladder in the right upper quadrant. IMPRESSION: Limited x-ray for tube placement demonstrates Dobbhoff tube overlying the midbody of the stomach Electronically Signed   By: Vincent [name] M.D.   On: 09/07/2022 10:10    PHYSICAL EXAM  General - Well nourished, well developed, intubated off sedation.   Ophthalmologic - fundi not visualized due to noncooperation.   Cardiovascular - irregularly irregular heart rate and rhythm.   Neuro - intubated off propofol, eyes open on voice, following simple commands. Eyes in more left gaze preference position, slowly tracking to right but not complete right gaze, pupils 43m and reactive to light. Corneal reflex present, gag and cough present. Breathing over the vent.   Facial symmetry not able to test due to ET tube.  Tongue protrusion not cooperative. Follows simple commands and against gravity BUEs, wiggle toes on b/l  feet. DTR 1+ and no babinski. Sensation, coordination not cooperative and gait not tested.     ASSESSMENT/PLAN [name] is a 87 y.o. male with history of  alcoholic cardiomyopathy, permanent atrial fibrillation on Eliquis, CHF with an EF 25 to 30% with global hypokinesis, CAD s/p stenting, DM2, prior stroke, chronic bilateral lower extremity wounds with edema, LLE peroneal neuropathy with chronic foot drop, COPD, HLD and HTN who presented to the ED via EMS this afternoon after he was found down.  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  CTA head & neck Occlusion of the left V4 segment and poor opacification of the basilar artery with apparent occlusion distally, age indeterminate. The bilateral PCAs are supplied by posterior communicating arteries with likely  retrograde flow into the P1 segments.Multifocal high-grade stenosis of the right M2 and M3 branches   S/p IR with TICI2c reperfusion MRI - Multiple areas of acute infarct involving the cerebellum bilaterally, left pons, and right frontal white matter. 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 Large area of chronic infarct and mild chronic hemorrhage and in the right MCA territory.  MRA  - Basilar artery is patent now.   CT repeat to rule out hydro 2D Echo EF 20-25%  LDL 56 HgbA1c 6.5 VTE prophylaxis - Heparin subq Eliquis (apixaban) daily prior to admission, now off heparin IV on ASA for now. Once stable in 2-3 days, will resume [facility] Therapy recommendations:  CIR Disposition:  Pending  Chronic Atrial Fibrillation  Home med: Eliquis Rate controlled Off heparin IV on aspirin for now.  Once stable in 2-3 days will resume AC.  Congestive heart failure Alcoholic cardiomyopathy 02/3418 EF 25 to 30%.  This admission EF 20 to 25%. Home meds including Imdur, digoxin, Jardiance, lasix On digoxin CCM managing Will consider cardiology consult if needed. Pt follows with Dr. Aundra [name] as outpt  Hypertension Home meds:  Lasix, coreg, aldactone Stable BP goal < 180/105 Long-term BP goal normotensive  Post operative respiratory insufficiency  Remains intubated post procedure CCM to manage ventilator settings Hx of COPD Extubate as able  Hyperlipidemia Home meds:  Atorvastatin '80mg'$   LDL 56, goal < 70 AST/ALT 64/48-> 33/30 Resumed lipitor 80 Continue statin at discharge  Diabetes type II Controlled Home meds:  Jardiance, metformin HgbA1c 6.5, goal < 7.0 CBGs SSI  Other Stroke Risk Factors Advanced Age >/= 28  Hx stroke/TIA Evidence of chronic infarcts on imaging Coronary artery disease status post stenting patient  Other Active Problems Chronic BLE wound Chronic b/l foot drop Recent left hip fracture - walking with walker at home Leukopenia  WBC 3.8->7.3->5.6 Hypokalemia K 3.2 -> 3.7->3.6   Hospital day # 3   Vincent Hawking, MD PhD Stroke Neurology 09/08/2022 9:52 AM  This patient is critically ill due to cerebellum and pontine infarct due to bilateral occlusion, status post thrombectomy, chronic A-fib, severe cardiomyopathy and at significant risk of neurological worsening, death form recurrent stroke, hemorrhagic transformation, hydrocephalus, heart failure, cardiac rest, seizure. This patient's care requires constant monitoring of vital signs, hemodynamics, respiratory and cardiac monitoring, review of multiple databases, neurological assessment, discussion with family, other specialists and medical decision making of high complexity. I spent 30 minutes of neurocritical care time in the care of this patient. Discussed with CCM  PA     To contact Stroke Continuity provider, please refer to [email or web address] After hours, contact General Neurology

## 2022-09-08 NOTE — Progress Notes (Signed)
Transported pt to and from 4N28 to CT 2 on vent. On transport back from CT, Pt began coughing up frank red blood in ETT. CCM NP called to bedside upon return to 4N28. Sample sent to lab per order.

## 2022-09-08 NOTE — Progress Notes (Signed)
Pharmacy Electrolyte Replacement  Recent Labs:  Recent Labs    09/08/22 0551  K 3.6  MG 2.0  PHOS 2.3*  CREATININE 0.87    Low Critical Values (K </= 2.5, Phos </= 1, Mg </= 1) Present: None  MD Contacted: n/a - no critical values noted  Plan: KPhos 3mol IV x 1 KCl 249m PT x 1 dose Recheck labs in AM per protocol   HaArturo MortonPharmD, BCPS Please check AMION for all [facility] numbers Clinical Pharmacist 09/08/2022 11:43 AM

## 2022-09-08 NOTE — Progress Notes (Signed)
NAME:  Vincent [name], MRN:  [id number], DOB:  1936-05-24, LOS: 3 ADMISSION DATE:  08/29/2022, CONSULTATION DATE:  09/06/2022 REFERRING MD:  Marjory Lies - NIR CHIEF COMPLAINT:  S/p basilar artery occlusion, vent Management   History of Present Illness:  87 year old man who presented to [facility] ED 1/8 as a Code Stroke after being found down at home. Presented with R gaze preference, L-sided deficits and dysarthria. PMHx significant for HTN, HLD, AF (on Eliquis), CHF (EF 25-30%), CAD s/p stent (2004), prior CVA with residual L-sided deficits, COPD, T2DM.  CTA Head/Neck demonstrated distal basilar artery occlusion, left V4 occlusion. He was seen by neurology and IR and was taken to IR for revascularization of distal basilar artery; TICI 2C revascularization achieved. Post-procedure CT negative for hemorrhage.  Post-procedure, he was left intubated and was transferred to ICU.   PCCM was asked to see for vent management.  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 s/p remote stent to Cx 2004 with chronic stable angina in context of residual circumflex disease.   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 ([place]) 09/25/2015   Hyperlipidemia    Hypertension    Long term (current) use of anticoagulants    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/25/2015   NSVT (nonsustained ventricular tachycardia) (HCC)    Permanent atrial fibrillation (HCC)    Pulmonary eosinophilia (HCC)    Sinus bradycardia    Stroke [facility])    Syncope and collapse    Unspecified glaucoma(365.9)    Significant Hospital Events: Including procedures,  antibiotic start and stop dates in addition to other pertinent events   1/9 Admit, to VIR for revascularization of occluded basilar artery. 1/11 agitated with vent wean  Interim History / Subjective:  Tachycardic, tachypneic and agitated with SBT this AM Repeat head CT without acute findings While in CT had bright red blood from ETT per respiratory   Objective:  Blood pressure (!) 144/66, pulse (!) 54, temperature 98.9 F (37.2 C), temperature source Axillary, resp. rate 13, height '5\' 9"'$  (1.753 m), weight 75.7 kg, SpO2 100 %.    Vent Mode: PRVC FiO2 (%):  [40 %] 40 % Set Rate:  [15 bmp] 15 bmp Vt Set:  [560 mL] 560 mL PEEP:  [5 cmH20] 5 cmH20 Pressure Support:  [10 cmH20] 10 cmH20 Plateau Pressure:  [14 cmH20-16 cmH20] 15 cmH20   Intake/Output Summary (Last 24 hours) at 09/08/2022 0759 Last data filed at 09/08/2022 0700 Gross per 24 hour  Intake 2378.51 ml  Output 500 ml  Net 1878.51 ml    Filed Weights   09/06/22 0200 09/06/22 0432  Weight: 84.2 kg 75.7 kg     General:  acutely ill elderly M intubated full vent support  HEENT: MM pink/moist, pupils equal and reactive Neuro: opens eyes to voice,moving L side and RLE to command, not moving RUE  CV: s1s2 rrr, no m/r/g PULM:  mechanically ventilated without significant rhonchi or wheezing, blood tinged, thick sputum suctioned GI: soft, bsx4 active  GU: dark red urine in foley bag without clots Extremities:  warm/dry, no edema  Skin: no rashes or lesions     Assessment & Plan:   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 CTA Head/Neck demonstrated distal basilar artery occlusion, left V4 occlusion. Taken to IR for revascularization of distal basilar artery; TICI 2C revascularization. Post-procedure CT negative for hemorrhage. - Stroke team primary - Post-procedure management per IR -repeat head CT today without acute findings  - Goal SBP 120-140 - remains arousable but somewhat lethargic - Echo with EF  20-25% similar to prior - Neuroprotective measures: HOB > 30 degrees, normoglycemia, normothermia, electrolytes WNL  Respiratory insufficiency - due to inability to protect the airway in the setting of above. Hx COPD Hemoptysis -more agitated and tachycardic with SBT,  remains the barrier to extubation.  Trial Precedex -Maintain full vent support with SAT/SBT as tolerated -titrate Vent setting to maintain SpO2 greater than or equal to 90%. -HOB elevated 30 degrees. -Plateau pressures less than 30 cm H20.  -Follow chest x-ray, ABG prn.   -Bronchial hygiene and RT/bronchodilator protocol. -bright red  hemoptysis reported in CT, more blood tinged later on my assessment, CXR with mild edema -check coags  -start Lasix '40mg'$    Hx A.fib on Eliquis, sCHF (echo from Aug 2023 with EF 25%-30%) CAD s/p stenting HTN HLD HFrEF - Heparin gtt held by neuro yesterday -Echo with EF 25-30% similar to prior - BP management as above - Continue statin, resume digoxin as appropriate - Hold spironolactone, Imdur, Entresto, Coreg; resume as clinically appropriate  Hx DM Hemoglobin A1C 6.5% - SSI - CBGs Q4H - Goal CBG 140-180 - Hold home medications   Hematuria -pt pulled at foley yesterday, perhaps secondary to trauma -no clots -monitor   Best practice (evaluated daily):  Diet/type: TF DVT prophylaxis: SCD GI prophylaxis: PPI Lines: Arterial Line Foley:  Yes, and it is still needed Code Status:  full code Last date of multidisciplinary goals of care discussion: Per primary, sister updated at the bedside  Critical care time: 42 minutes   The patient is critically ill with multiple organ system failure and requires high complexity decision making for assessment and support, frequent evaluation and titration of therapies, advanced monitoring, review of radiographic studies and interpretation of complex data.   Critical Care Time devoted to patient care services, exclusive of separately  billable procedures, described in this note is 41 minutes.  Otilio Carpen Ahman Dugdale, PA-C [facility] 09/08/22 7:59 AM  Please see [email or web address] for pager details.  From 7A-7P if no response, please call [phone] After hours, please call ELink [phone]

## 2022-09-08 NOTE — Progress Notes (Signed)
[facility] Progress Note Patient Name: Vincent [name] DOB: 06/21/1936 MRN: [id number]   Date of Service  09/08/2022  HPI/Events of Note  Hematuria - Nursing reports blood in condom cath which she says is about 25 mL. Foley removed today and patient has been I/O cathed X 1 already and protocol requires repeat I/O cath now. Nurse concerned about trauma asking for Foley. Patient is presently on Heparin 5000 units Roberts Q 8 hours for DVT prophylaxis.   eICU Interventions  Plan: D/C Heparin [place] and maintain SCD's. I/O Cath PRN.      Intervention Category Major Interventions: Other:  Lysle Dingwall 09/08/2022, 2:44 AM

## 2022-09-08 NOTE — Progress Notes (Signed)
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 Estanislado Pandy.    Subjective:  Patient intubated, failed breathing trial this morning.  RN reports intermittent movement of all 4s, sometimes follows simple commands.  Nods "yes" to "Can you hear me?"  Allergies: Patient has no known allergies.  Medications: Prior to Admission medications   Medication Sig Start Date End Date Taking? Authorizing Provider  apixaban (ELIQUIS) 5 MG TABS tablet Take 1 tablet (5 mg total) by mouth 2 (two) times daily. 05/16/22  Yes Larey Dresser, MD  carvedilol (COREG) 6.25 MG tablet Take 1 tablet (6.25 mg total) by mouth 2 (two) times daily with a meal. 12/07/21  Yes Larey Dresser, MD  acetaminophen (TYLENOL) 500 MG tablet Take 2 tablets (1,000 mg total) by mouth every 6 (six) hours as needed. Patient taking differently: Take 1,000 mg by mouth every 6 (six) hours as needed for moderate pain or mild pain. 02/21/21   [name], MD  atorvastatin (LIPITOR) 80 MG tablet Take 1 tablet (80 mg total) by mouth daily. 06/29/22   Milford, Maricela Bo, FNP  Brimonidine Tartrate (ALPHAGAN P OP) Place 1 drop into both eyes in the morning and at bedtime.    [provider]  Calcium Carbonate-Vitamin D (OSCAL 500 D-3 PO) Take 1 tablet by mouth 2 (two) times daily.    [provider]  Cyanocobalamin (VITAMIN B-12 CR PO) Take 1 tablet by mouth daily.    [provider]  digoxin (LANOXIN) 0.125 MG tablet Take 1 tablet (0.125 mg total) by mouth daily. 06/29/22   Milford, Maricela Bo, FNP  empagliflozin (JARDIANCE) 25 MG TABS tablet Take 25 mg by mouth daily.    [provider]  famotidine (PEPCID) 20 MG tablet Take 20 mg by mouth daily.    [provider]  Ferrous Sulfate (IRON PO) Take 1 tablet by mouth daily.    [provider]  furosemide  (LASIX) 40 MG tablet Take 40 mg by mouth daily.    [provider]  isosorbide mononitrate (IMDUR) 30 MG 24 hr tablet Take 1 tablet (30 mg total) by mouth daily. 12/17/21   Larey Dresser, MD  loratadine (CLARITIN) 10 MG tablet Take 10 mg by mouth daily.    [provider]  LUMIGAN 0.01 % SOLN Place 1 drop into both eyes at bedtime. 06/05/22   [provider]  Magnesium 400 MG TABS Take 400 mg by mouth 2 (two) times daily.    [provider]  metFORMIN (GLUCOPHAGE) 500 MG tablet Take 500 mg by mouth 2 (two) times daily. 06/16/19   [provider]  nitroGLYCERIN (NITROLINGUAL) 0.4 MG/SPRAY spray Place 1 spray under the tongue every 5 (five) minutes x 3 doses as needed for chest pain. 06/29/22   Milford, Maricela Bo, FNP  Omega-3 Fatty Acids (OMEGA-3 FISH OIL PO) Take 1 capsule by mouth daily.    [provider]  oxyCODONE (OXY IR/ROXICODONE) 5 MG immediate release tablet Take 1 tablet (5 mg total) by mouth every 6 (six) hours as needed for severe pain. 07/27/22   Arrien, Jimmy Picket, MD  sacubitril-valsartan (ENTRESTO) 97-103 MG Take 1 tablet by mouth 2 (two) times daily. 12/07/21   Larey Dresser, MD  spironolactone (ALDACTONE) 25 MG tablet Take 25 mg by mouth daily.    [provider]     Vital Signs:  BP (!) 145/87   Pulse 69   Temp 98.8 F (37.1 C) (Axillary)   Resp (!) 21   Ht '5\' 9"'$  (1.753 m)   Wt 166 lb 14.2 oz (75.7 kg)   SpO2 100%   BMI 24.65 kg/m   Physical Exam Constitutional:      General: He is awake.     Appearance: He is ill-appearing.     Interventions: He is intubated.  Cardiovascular:     Rate and Rhythm: Normal rate.     Pulses: Normal pulses.  Pulmonary:     Effort: He is intubated.  Skin:    General: Skin is warm and dry.  Neurological:     Comments: Follows some commands, moving left side greater than right, does not attempt to communicate.    Imaging: CT HEAD WO CONTRAST (5MM)  Result  Date: 09/08/2022 CLINICAL DATA:  Stroke follow-up EXAM: CT HEAD WITHOUT CONTRAST TECHNIQUE: Contiguous axial images were obtained from the base of the skull through the vertex without intravenous contrast. RADIATION DOSE REDUCTION: This exam was performed according to the departmental dose-optimization program which includes automated exposure control, adjustment of the mA and/or kV according to patient size and/or use of iterative reconstruction technique. COMPARISON:  MRI Brain 09/06/22 FINDINGS: Brain: Redemonstrated is a large chronic right MCA territory infarct. Redemonstrated evolving bilateral cerebellar infarcts. There is increased conspicuity of the pontine infarct seen on prior brain MRI. Ex vacuo dilatation of the right lateral ventricular system. No hydrocephalus. No extra-axial fluid collection. No hemorrhage. Vascular: No hyperdense vessel or unexpected calcification. Skull: Normal. Negative for fracture or focal lesion. Sinuses/Orbits: Bilateral lens replacement. Mild mucosal thickening in the bilateral ethmoid and left sphenoid sinus. No middle ear or mastoid effusion. Partially visualized enteric tube place. Other: None IMPRESSION: 1. Redemonstrated evolving bilateral cerebellar infarcts with increased conspicuity of the pontine infarct seen on prior brain MRI. 2. Redemonstrated large chronic right MCA territory infarct. 3. No hemorrhage or CT evidence of a new infarct. Electronically Signed   By: Marin Roberts M.D.   On: 09/08/2022 11:18   DG CHEST PORT 1 VIEW  Result Date: 09/08/2022 CLINICAL DATA:  Hemoptysis EXAM: PORTABLE CHEST 1 VIEW COMPARISON:  09/07/2022 FINDINGS: Endotracheal tube terminates at the level of the clavicular heads. Enteric tube courses below the diaphragm with distal tip beyond the inferior margin of the film. Stable cardiomegaly. Mild pulmonary vascular congestion. Similar mild perihilar and bibasilar interstitial prominence without new airspace consolidation. No pleural  effusion or pneumothorax. IMPRESSION: Stable radiographic appearance of the chest. Cardiomegaly with mild pulmonary vascular congestion. Electronically Signed   By: Davina Poke D.O.   On: 09/08/2022 11:13   DG Abd Portable 1V  Result Date: 09/07/2022 CLINICAL DATA:  Tube placement EXAM: PORTABLE ABDOMEN - 1 VIEW COMPARISON:  09/06/2022 FINDINGS: Limited portable view of the upper abdomen demonstrates Dobbhoff tube with tip overlying the stomach. This could be advanced further. There is some gas seen in nondilated loops of bowel elsewhere in the abdomen. Preserved multiple stones in the gallbladder in the right upper quadrant. IMPRESSION: Limited x-ray for tube placement demonstrates Dobbhoff tube overlying the midbody of the stomach Electronically Signed   By: Jill Side M.D.   On: 09/07/2022 10:10   IR PERCUTANEOUS ART THROMBECTOMY/INFUSION INTRACRANIAL INC DIAG ANGIO  Result Date: 09/07/2022 INDICATION: Severe dysarthria, right-sided nystagmus, bilateral upper and lower extremity weakness and progressive decrease in level of consciousness. 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 EXAM: 1.  EMERGENT LARGE VESSEL OCCLUSION THROMBOLYSIS (POSTERIOR CIRCULATION) COMPARISON:  CTA of the head and neck September 05, 2022. MEDICATIONS: Ancef 2 g IV antibiotic was administered within 1 hour of the procedure. ANESTHESIA/SEDATION: General anesthesia. CONTRAST:  Omnipaque 300 approximately 140 cc. FLUOROSCOPY TIME:  Fluoroscopy Time: 135 minutes 6 seconds (2702 mGy). COMPLICATIONS: None immediate. TECHNIQUE: Following a full explanation of the procedure along with the potential associated complications, an informed witnessed consent was obtained from the patient's sister. The risks of intracranial hemorrhage of 10%, worsening neurological deficit, ventilator dependency, death and inability to revascularize were all reviewed in detail with the patient's sister. The patient was then put under  general anesthesia by the Department of Anesthesiology at [facility]. The right groin was prepped and draped in the usual sterile fashion. Thereafter using modified Seldinger technique, transfemoral access into the right common femoral artery was obtained without difficulty. Over a 0.035 inch guidewire an 8 French 25 cm Pinnacle sheath was inserted. Through this, and also over a 0.035 inch guidewire a 5 [place] JB 1 catheter was advanced to the aortic arch region and selectively positioned in the innominate artery, the left subclavian artery and the right common carotid artery. FINDINGS: The left subclavian artery demonstrates a hypoplastic non dominant left vertebral artery with a patent origin. Vessel is seen to opacify to the cranial skull base where it terminates just distal to the left posterior-inferior cerebellar artery. The innominate arteriogram demonstrates patency of the right subclavian artery and the right common carotid artery proximally. The right vertebral artery at its origin demonstrates approximately 30% stenosis associated with an irregular atherosclerotic plaque. The vessel is, otherwise, seen to opacify to the cranial skull base. Opacification is noted into the right vertebrobasilar junction and the right posterior-inferior cerebellar artery. The right common carotid arteriogram demonstrates the right external carotid artery and its major branches to be widely patent. The right internal carotid artery at the bulb to the cranial skull base is also widely patent. PROCEDURE: The 5 [place] JB 1 catheter was advanced distally into the right subclavian artery over an 035 inch guidewire. The guidewire was removed. Good aspiration obtained from the hub of the 5 French diagnostic catheter. A gentle control arteriogram performed through this demonstrated no evidence of spasms, dissections or of intraluminal filling defects. Again demonstrated was the stenotic origin of the right vertebral artery  with moderate proximal tortuosity. The vessel, more distally, demonstrates moderate to moderately severe tortuosity to the right vertebrobasilar junction with slow stagnant flow noted distal to this. Over an 035 inch 300 cm Rosen exchange guidewire, an 8 French 90 cm Neuron Max guide catheter was advanced and positioned just proximal to the origin of the right vertebral artery. Over an 035 inch guidewire, a 132 cm 6 [place] Catalyst guide catheter was then advanced just proximal to the origin of the right vertebral artery. The glidewire was then advanced to the mid right vertebral artery. Subsequent advancement of the 6 [place] Catalyst guide catheter, and the Neuron Max sheath was met herniation into the aortic arch. Attempts were then made using 027 microcatheter with the 018 micro guidewire which resulted in instability of the platform resulting in herniation of the combination into the aortic arch. This approach was abandoned. The right radial artery was then identified with ultrasound, and its morphology documented and radial PACS system. A dorsal palmar anastomosis was verified present. Over an 018 inch micro guidewire, a 6/7 French radial sheath was then advanced without difficulty. The obturator, and the  micro guidewire were removed. Good aspiration was obtained from the hub of the radial sheath. 200 mcg of nitroglycerin, and 2.5 mg of verapamil and 1000 units of heparin was then injected through the radial sheath without event. A right radial arteriogram was then performed. Over an 035 inch guidewire, a 5 French Simmons 2 diagnostic catheter was advanced into the proximal 1/3 of the right vertebral artery. The guidewire was removed. Good aspiration obtained from the hub of the diagnostic catheter. A gentle control arteriogram performed through this demonstrated moderately extensive tortuosity of the right vertebral artery to the cranial skull base. Also noted at this time was patency of the right  posterior-inferior cerebellar artery with slow flow noted into the distal 1/3 of the basilar artery which remained occluded. Patency was noted of the anterior-inferior cerebellar arteries. Over an 035 inch 280 cm Terumo exchange guidewire, a 5 cm 072 Benchmark catheter was advanced and positioned at the junction of the middle and the proximal 1/3 of the right vertebral artery. The guidewire was removed. Good aspiration obtained from the hub of the Benchmark guide catheter. A gentle control arteriogram performed through the Benchmark guide catheter demonstrated no evidence of spasms, dissections or of intraluminal filling defects. A combination of an 021 160 cm microcatheter inside of a 120 cm 45 Phenom plus catheter was advanced over an 014 inch support micro guidewire with a moderate J configuration, the combination was advanced without difficulty to the proximal basilar artery. The micro guidewire was then gently advanced into the left posterior cerebral artery proximal petrous region followed by the microcatheter. The guidewire was removed. Good aspiration obtained from the hub of the microcatheter. A gentle control arteriogram performed through the microcatheter demonstrated safe positioning of the tip of the microcatheter which was then connected to continuous heparinized saline infusion. The Benchmark microcatheter was advanced as distally as possible to the right vertebrobasilar junction to the right posterior-inferior cerebellar artery. A 4 mm x 40 mm Solitaire X retrieval device was advanced and positioned in the P1 P2 junction more proximally into the basilar artery. Aspiration was applied at the hub of the Benchmark guide catheter for approximately 2-1/2 minutes. Thereafter, the retrieval device, the microcatheter and the Phenom Plus catheter were retrieved and removed. A control arteriogram performed through the Benchmark guide catheter in the distal right vertebral artery demonstrated complete  revascularization of the basilar artery and of the left posterior cerebral artery and the left superior cerebellar artery. No supraclinoid opacification noted of the right P1 segment due to inflow from the anterior circulation. The right superior cerebellar artery remained occluded. A TICI 2C revascularization was achieved. The Benchmark guide catheter was then retrieved more proximally into the right subclavian artery. A gentle control arteriogram performed at this site demonstrated patency of the right vertebral artery extra cranially and intracranially. The 072 guide catheter was removed. A wrist band was then applied at the hub of the radial puncture site. Distal right radial pulse was verified to be present. A flat panel CT of the brain demonstrated no evidence of intracranial hemorrhage. No mass effect was noted either. Patient was left intubated due to his medical condition. He was then transferred to the neuro ICU for post revascularization care. IMPRESSION: Status post endovascular complete revascularization of the occluded basilar artery, the left posterior cerebral artery and the left superior cerebellar artery and partially the right superior cerebellar artery with 1 pass with a 4 mm x 40 mm Solitaire X retrieval device and proximal aspiration. PLAN:  Follow-up as per referring MD. Electronically Signed   By: Luanne Bras M.D.   On: 09/07/2022 08:31   IR CT Head Ltd  Result Date: 09/07/2022 INDICATION: Severe dysarthria, right-sided nystagmus, bilateral upper and lower extremity weakness and progressive decrease in level of consciousness. 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 EXAM: 1. EMERGENT LARGE VESSEL OCCLUSION THROMBOLYSIS (POSTERIOR CIRCULATION) COMPARISON:  CTA of the head and neck September 05, 2022. MEDICATIONS: Ancef 2 g IV antibiotic was administered within 1 hour of the procedure. ANESTHESIA/SEDATION: General anesthesia. CONTRAST:  Omnipaque 300 approximately  140 cc. FLUOROSCOPY TIME:  Fluoroscopy Time: 135 minutes 6 seconds (2702 mGy). COMPLICATIONS: None immediate. TECHNIQUE: Following a full explanation of the procedure along with the potential associated complications, an informed witnessed consent was obtained from the patient's sister. The risks of intracranial hemorrhage of 10%, worsening neurological deficit, ventilator dependency, death and inability to revascularize were all reviewed in detail with the patient's sister. The patient was then put under general anesthesia by the Department of Anesthesiology at [facility]. The right groin was prepped and draped in the usual sterile fashion. Thereafter using modified Seldinger technique, transfemoral access into the right common femoral artery was obtained without difficulty. Over a 0.035 inch guidewire an 8 French 25 cm Pinnacle sheath was inserted. Through this, and also over a 0.035 inch guidewire a 5 [place] JB 1 catheter was advanced to the aortic arch region and selectively positioned in the innominate artery, the left subclavian artery and the right common carotid artery. FINDINGS: The left subclavian artery demonstrates a hypoplastic non dominant left vertebral artery with a patent origin. Vessel is seen to opacify to the cranial skull base where it terminates just distal to the left posterior-inferior cerebellar artery. The innominate arteriogram demonstrates patency of the right subclavian artery and the right common carotid artery proximally. The right vertebral artery at its origin demonstrates approximately 30% stenosis associated with an irregular atherosclerotic plaque. The vessel is, otherwise, seen to opacify to the cranial skull base. Opacification is noted into the right vertebrobasilar junction and the right posterior-inferior cerebellar artery. The right common carotid arteriogram demonstrates the right external carotid artery and its major branches to be widely patent. The right  internal carotid artery at the bulb to the cranial skull base is also widely patent. PROCEDURE: The 5 [place] JB 1 catheter was advanced distally into the right subclavian artery over an 035 inch guidewire. The guidewire was removed. Good aspiration obtained from the hub of the 5 French diagnostic catheter. A gentle control arteriogram performed through this demonstrated no evidence of spasms, dissections or of intraluminal filling defects. Again demonstrated was the stenotic origin of the right vertebral artery with moderate proximal tortuosity. The vessel, more distally, demonstrates moderate to moderately severe tortuosity to the right vertebrobasilar junction with slow stagnant flow noted distal to this. Over an 035 inch 300 cm Rosen exchange guidewire, an 8 French 90 cm Neuron Max guide catheter was advanced and positioned just proximal to the origin of the right vertebral artery. Over an 035 inch guidewire, a 132 cm 6 [place] Catalyst guide catheter was then advanced just proximal to the origin of the right vertebral artery. The glidewire was then advanced to the mid right vertebral artery. Subsequent advancement of the 6 [place] Catalyst guide catheter, and the Neuron Max sheath was met herniation into the aortic arch. Attempts were then made using 027 microcatheter with the 018 micro guidewire which resulted in instability of  the platform resulting in herniation of the combination into the aortic arch. This approach was abandoned. The right radial artery was then identified with ultrasound, and its morphology documented and radial PACS system. A dorsal palmar anastomosis was verified present. Over an 018 inch micro guidewire, a 6/7 French radial sheath was then advanced without difficulty. The obturator, and the micro guidewire were removed. Good aspiration was obtained from the hub of the radial sheath. 200 mcg of nitroglycerin, and 2.5 mg of verapamil and 1000 units of heparin was then injected through the  radial sheath without event. A right radial arteriogram was then performed. Over an 035 inch guidewire, a 5 French Simmons 2 diagnostic catheter was advanced into the proximal 1/3 of the right vertebral artery. The guidewire was removed. Good aspiration obtained from the hub of the diagnostic catheter. A gentle control arteriogram performed through this demonstrated moderately extensive tortuosity of the right vertebral artery to the cranial skull base. Also noted at this time was patency of the right posterior-inferior cerebellar artery with slow flow noted into the distal 1/3 of the basilar artery which remained occluded. Patency was noted of the anterior-inferior cerebellar arteries. Over an 035 inch 280 cm Terumo exchange guidewire, a 5 cm 072 Benchmark catheter was advanced and positioned at the junction of the middle and the proximal 1/3 of the right vertebral artery. The guidewire was removed. Good aspiration obtained from the hub of the Benchmark guide catheter. A gentle control arteriogram performed through the Benchmark guide catheter demonstrated no evidence of spasms, dissections or of intraluminal filling defects. A combination of an 021 160 cm microcatheter inside of a 120 cm 45 Phenom plus catheter was advanced over an 014 inch support micro guidewire with a moderate J configuration, the combination was advanced without difficulty to the proximal basilar artery. The micro guidewire was then gently advanced into the left posterior cerebral artery proximal petrous region followed by the microcatheter. The guidewire was removed. Good aspiration obtained from the hub of the microcatheter. A gentle control arteriogram performed through the microcatheter demonstrated safe positioning of the tip of the microcatheter which was then connected to continuous heparinized saline infusion. The Benchmark microcatheter was advanced as distally as possible to the right vertebrobasilar junction to the right  posterior-inferior cerebellar artery. A 4 mm x 40 mm Solitaire X retrieval device was advanced and positioned in the P1 P2 junction more proximally into the basilar artery. Aspiration was applied at the hub of the Benchmark guide catheter for approximately 2-1/2 minutes. Thereafter, the retrieval device, the microcatheter and the Phenom Plus catheter were retrieved and removed. A control arteriogram performed through the Benchmark guide catheter in the distal right vertebral artery demonstrated complete revascularization of the basilar artery and of the left posterior cerebral artery and the left superior cerebellar artery. No supraclinoid opacification noted of the right P1 segment due to inflow from the anterior circulation. The right superior cerebellar artery remained occluded. A TICI 2C revascularization was achieved. The Benchmark guide catheter was then retrieved more proximally into the right subclavian artery. A gentle control arteriogram performed at this site demonstrated patency of the right vertebral artery extra cranially and intracranially. The 072 guide catheter was removed. A wrist band was then applied at the hub of the radial puncture site. Distal right radial pulse was verified to be present. A flat panel CT of the brain demonstrated no evidence of intracranial hemorrhage. No mass effect was noted either. Patient was left intubated due to his medical  condition. He was then transferred to the neuro ICU for post revascularization care. IMPRESSION: Status post endovascular complete revascularization of the occluded basilar artery, the left posterior cerebral artery and the left superior cerebellar artery and partially the right superior cerebellar artery with 1 pass with a 4 mm x 40 mm Solitaire X retrieval device and proximal aspiration. PLAN: Follow-up as per referring MD. Electronically Signed   By: Luanne Bras M.D.   On: 09/07/2022 08:31   IR US Guide Vasc Access Right  Result Date:  09/07/2022 INDICATION: Severe dysarthria, right-sided nystagmus, bilateral upper and lower extremity weakness and progressive decrease in level of consciousness. 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 EXAM: 1. EMERGENT LARGE VESSEL OCCLUSION THROMBOLYSIS (POSTERIOR CIRCULATION) COMPARISON:  CTA of the head and neck September 05, 2022. MEDICATIONS: Ancef 2 g IV antibiotic was administered within 1 hour of the procedure. ANESTHESIA/SEDATION: General anesthesia. CONTRAST:  Omnipaque 300 approximately 140 cc. FLUOROSCOPY TIME:  Fluoroscopy Time: 135 minutes 6 seconds (2702 mGy). COMPLICATIONS: None immediate. TECHNIQUE: Following a full explanation of the procedure along with the potential associated complications, an informed witnessed consent was obtained from the patient's sister. The risks of intracranial hemorrhage of 10%, worsening neurological deficit, ventilator dependency, death and inability to revascularize were all reviewed in detail with the patient's sister. The patient was then put under general anesthesia by the Department of Anesthesiology at [facility] The right groin was prepped and draped in the usual sterile fashion. Thereafter using modified Seldinger technique, transfemoral access into the right common femoral artery was obtained without difficulty. Over a 0.035 inch guidewire an 8 French 25 cm Pinnacle sheath was inserted. Through this, and also over a 0.035 inch guidewire a 5 [place] JB 1 catheter was advanced to the aortic arch region and selectively positioned in the innominate artery, the left subclavian artery and the right common carotid artery. FINDINGS: The left subclavian artery demonstrates a hypoplastic non dominant left vertebral artery with a patent origin. Vessel is seen to opacify to the cranial skull base where it terminates just distal to the left posterior-inferior cerebellar artery. The innominate arteriogram demonstrates patency of the right  subclavian artery and the right common carotid artery proximally. The right vertebral artery at its origin demonstrates approximately 30% stenosis associated with an irregular atherosclerotic plaque. The vessel is, otherwise, seen to opacify to the cranial skull base. Opacification is noted into the right vertebrobasilar junction and the right posterior-inferior cerebellar artery. The right common carotid arteriogram demonstrates the right external carotid artery and its major branches to be widely patent. The right internal carotid artery at the bulb to the cranial skull base is also widely patent. PROCEDURE: The 5 [place] JB 1 catheter was advanced distally into the right subclavian artery over an 035 inch guidewire. The guidewire was removed. Good aspiration obtained from the hub of the 5 French diagnostic catheter. A gentle control arteriogram performed through this demonstrated no evidence of spasms, dissections or of intraluminal filling defects. Again demonstrated was the stenotic origin of the right vertebral artery with moderate proximal tortuosity. The vessel, more distally, demonstrates moderate to moderately severe tortuosity to the right vertebrobasilar junction with slow stagnant flow noted distal to this. Over an 035 inch 300 cm Rosen exchange guidewire, an 8 French 90 cm Neuron Max guide catheter was advanced and positioned just proximal to the origin of the right vertebral artery. Over an 035 inch guidewire, a 132 cm 6 [place] Catalyst guide catheter  was then advanced just proximal to the origin of the right vertebral artery. The glidewire was then advanced to the mid right vertebral artery. Subsequent advancement of the 6 [place] Catalyst guide catheter, and the Neuron Max sheath was met herniation into the aortic arch. Attempts were then made using 027 microcatheter with the 018 micro guidewire which resulted in instability of the platform resulting in herniation of the combination into the aortic  arch. This approach was abandoned. The right radial artery was then identified with ultrasound, and its morphology documented and radial PACS system. A dorsal palmar anastomosis was verified present. Over an 018 inch micro guidewire, a 6/7 French radial sheath was then advanced without difficulty. The obturator, and the micro guidewire were removed. Good aspiration was obtained from the hub of the radial sheath. 200 mcg of nitroglycerin, and 2.5 mg of verapamil and 1000 units of heparin was then injected through the radial sheath without event. A right radial arteriogram was then performed. Over an 035 inch guidewire, a 5 French Simmons 2 diagnostic catheter was advanced into the proximal 1/3 of the right vertebral artery. The guidewire was removed. Good aspiration obtained from the hub of the diagnostic catheter. A gentle control arteriogram performed through this demonstrated moderately extensive tortuosity of the right vertebral artery to the cranial skull base. Also noted at this time was patency of the right posterior-inferior cerebellar artery with slow flow noted into the distal 1/3 of the basilar artery which remained occluded. Patency was noted of the anterior-inferior cerebellar arteries. Over an 035 inch 280 cm Terumo exchange guidewire, a 5 cm 072 Benchmark catheter was advanced and positioned at the junction of the middle and the proximal 1/3 of the right vertebral artery. The guidewire was removed. Good aspiration obtained from the hub of the Benchmark guide catheter. A gentle control arteriogram performed through the Benchmark guide catheter demonstrated no evidence of spasms, dissections or of intraluminal filling defects. A combination of an 021 160 cm microcatheter inside of a 120 cm 45 Phenom plus catheter was advanced over an 014 inch support micro guidewire with a moderate J configuration, the combination was advanced without difficulty to the proximal basilar artery. The micro guidewire was  then gently advanced into the left posterior cerebral artery proximal petrous region followed by the microcatheter. The guidewire was removed. Good aspiration obtained from the hub of the microcatheter. A gentle control arteriogram performed through the microcatheter demonstrated safe positioning of the tip of the microcatheter which was then connected to continuous heparinized saline infusion. The Benchmark microcatheter was advanced as distally as possible to the right vertebrobasilar junction to the right posterior-inferior cerebellar artery. A 4 mm x 40 mm Solitaire X retrieval device was advanced and positioned in the P1 P2 junction more proximally into the basilar artery. Aspiration was applied at the hub of the Benchmark guide catheter for approximately 2-1/2 minutes. Thereafter, the retrieval device, the microcatheter and the Phenom Plus catheter were retrieved and removed. A control arteriogram performed through the Benchmark guide catheter in the distal right vertebral artery demonstrated complete revascularization of the basilar artery and of the left posterior cerebral artery and the left superior cerebellar artery. No supraclinoid opacification noted of the right P1 segment due to inflow from the anterior circulation. The right superior cerebellar artery remained occluded. A TICI 2C revascularization was achieved. The Benchmark guide catheter was then retrieved more proximally into the right subclavian artery. A gentle control arteriogram performed at this site demonstrated patency of the right  vertebral artery extra cranially and intracranially. The 072 guide catheter was removed. A wrist band was then applied at the hub of the radial puncture site. Distal right radial pulse was verified to be present. A flat panel CT of the brain demonstrated no evidence of intracranial hemorrhage. No mass effect was noted either. Patient was left intubated due to his medical condition. He was then transferred to the  neuro ICU for post revascularization care. IMPRESSION: Status post endovascular complete revascularization of the occluded basilar artery, the left posterior cerebral artery and the left superior cerebellar artery and partially the right superior cerebellar artery with 1 pass with a 4 mm x 40 mm Solitaire X retrieval device and proximal aspiration. PLAN: Follow-up as per referring MD. Electronically Signed   By: Luanne Bras M.D.   On: 09/07/2022 08:31   IR ANGIO INTRA EXTRACRAN SEL INTERNAL CAROTID UNI R MOD SED  Result Date: 09/07/2022 INDICATION: Severe dysarthria, right-sided nystagmus, bilateral upper and lower extremity weakness and progressive decrease in level of consciousness. 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 EXAM: 1. EMERGENT LARGE VESSEL OCCLUSION THROMBOLYSIS (POSTERIOR CIRCULATION) COMPARISON:  CTA of the head and neck September 05, 2022. MEDICATIONS: Ancef 2 g IV antibiotic was administered within 1 hour of the procedure. ANESTHESIA/SEDATION: General anesthesia. CONTRAST:  Omnipaque 300 approximately 140 cc. FLUOROSCOPY TIME:  Fluoroscopy Time: 135 minutes 6 seconds (2702 mGy). COMPLICATIONS: None immediate. TECHNIQUE: Following a full explanation of the procedure along with the potential associated complications, an informed witnessed consent was obtained from the patient's sister. The risks of intracranial hemorrhage of 10%, worsening neurological deficit, ventilator dependency, death and inability to revascularize were all reviewed in detail with the patient's sister. The patient was then put under general anesthesia by the Department of Anesthesiology at [facility]. The right groin was prepped and draped in the usual sterile fashion. Thereafter using modified Seldinger technique, transfemoral access into the right common femoral artery was obtained without difficulty. Over a 0.035 inch guidewire an 8 French 25 cm Pinnacle sheath was inserted. Through  this, and also over a 0.035 inch guidewire a 5 [place] JB 1 catheter was advanced to the aortic arch region and selectively positioned in the innominate artery, the left subclavian artery and the right common carotid artery. FINDINGS: The left subclavian artery demonstrates a hypoplastic non dominant left vertebral artery with a patent origin. Vessel is seen to opacify to the cranial skull base where it terminates just distal to the left posterior-inferior cerebellar artery. The innominate arteriogram demonstrates patency of the right subclavian artery and the right common carotid artery proximally. The right vertebral artery at its origin demonstrates approximately 30% stenosis associated with an irregular atherosclerotic plaque. The vessel is, otherwise, seen to opacify to the cranial skull base. Opacification is noted into the right vertebrobasilar junction and the right posterior-inferior cerebellar artery. The right common carotid arteriogram demonstrates the right external carotid artery and its major branches to be widely patent. The right internal carotid artery at the bulb to the cranial skull base is also widely patent. PROCEDURE: The 5 [place] JB 1 catheter was advanced distally into the right subclavian artery over an 035 inch guidewire. The guidewire was removed. Good aspiration obtained from the hub of the 5 French diagnostic catheter. A gentle control arteriogram performed through this demonstrated no evidence of spasms, dissections or of intraluminal filling defects. Again demonstrated was the stenotic origin of the right vertebral artery with moderate proximal tortuosity. The  vessel, more distally, demonstrates moderate to moderately severe tortuosity to the right vertebrobasilar junction with slow stagnant flow noted distal to this. Over an 035 inch 300 cm Rosen exchange guidewire, an 8 French 90 cm Neuron Max guide catheter was advanced and positioned just proximal to the origin of the right  vertebral artery. Over an 035 inch guidewire, a 132 cm 6 [place] Catalyst guide catheter was then advanced just proximal to the origin of the right vertebral artery. The glidewire was then advanced to the mid right vertebral artery. Subsequent advancement of the 6 [place] Catalyst guide catheter, and the Neuron Max sheath was met herniation into the aortic arch. Attempts were then made using 027 microcatheter with the 018 micro guidewire which resulted in instability of the platform resulting in herniation of the combination into the aortic arch. This approach was abandoned. The right radial artery was then identified with ultrasound, and its morphology documented and radial PACS system. A dorsal palmar anastomosis was verified present. Over an 018 inch micro guidewire, a 6/7 French radial sheath was then advanced without difficulty. The obturator, and the micro guidewire were removed. Good aspiration was obtained from the hub of the radial sheath. 200 mcg of nitroglycerin, and 2.5 mg of verapamil and 1000 units of heparin was then injected through the radial sheath without event. A right radial arteriogram was then performed. Over an 035 inch guidewire, a 5 French Simmons 2 diagnostic catheter was advanced into the proximal 1/3 of the right vertebral artery. The guidewire was removed. Good aspiration obtained from the hub of the diagnostic catheter. A gentle control arteriogram performed through this demonstrated moderately extensive tortuosity of the right vertebral artery to the cranial skull base. Also noted at this time was patency of the right posterior-inferior cerebellar artery with slow flow noted into the distal 1/3 of the basilar artery which remained occluded. Patency was noted of the anterior-inferior cerebellar arteries. Over an 035 inch 280 cm Terumo exchange guidewire, a 5 cm 072 Benchmark catheter was advanced and positioned at the junction of the middle and the proximal 1/3 of the right vertebral  artery. The guidewire was removed. Good aspiration obtained from the hub of the Benchmark guide catheter. A gentle control arteriogram performed through the Benchmark guide catheter demonstrated no evidence of spasms, dissections or of intraluminal filling defects. A combination of an 021 160 cm microcatheter inside of a 120 cm 45 Phenom plus catheter was advanced over an 014 inch support micro guidewire with a moderate J configuration, the combination was advanced without difficulty to the proximal basilar artery. The micro guidewire was then gently advanced into the left posterior cerebral artery proximal petrous region followed by the microcatheter. The guidewire was removed. Good aspiration obtained from the hub of the microcatheter. A gentle control arteriogram performed through the microcatheter demonstrated safe positioning of the tip of the microcatheter which was then connected to continuous heparinized saline infusion. The Benchmark microcatheter was advanced as distally as possible to the right vertebrobasilar junction to the right posterior-inferior cerebellar artery. A 4 mm x 40 mm Solitaire X retrieval device was advanced and positioned in the P1 P2 junction more proximally into the basilar artery. Aspiration was applied at the hub of the Benchmark guide catheter for approximately 2-1/2 minutes. Thereafter, the retrieval device, the microcatheter and the Phenom Plus catheter were retrieved and removed. A control arteriogram performed through the Benchmark guide catheter in the distal right vertebral artery demonstrated complete revascularization of the basilar artery and of  the left posterior cerebral artery and the left superior cerebellar artery. No supraclinoid opacification noted of the right P1 segment due to inflow from the anterior circulation. The right superior cerebellar artery remained occluded. A TICI 2C revascularization was achieved. The Benchmark guide catheter was then retrieved more  proximally into the right subclavian artery. A gentle control arteriogram performed at this site demonstrated patency of the right vertebral artery extra cranially and intracranially. The 072 guide catheter was removed. A wrist band was then applied at the hub of the radial puncture site. Distal right radial pulse was verified to be present. A flat panel CT of the brain demonstrated no evidence of intracranial hemorrhage. No mass effect was noted either. Patient was left intubated due to his medical condition. He was then transferred to the neuro ICU for post revascularization care. IMPRESSION: Status post endovascular complete revascularization of the occluded basilar artery, the left posterior cerebral artery and the left superior cerebellar artery and partially the right superior cerebellar artery with 1 pass with a 4 mm x 40 mm Solitaire X retrieval device and proximal aspiration. PLAN: Follow-up as per referring MD. Electronically Signed   By: Luanne Bras M.D.   On: 09/07/2022 08:31   Portable Chest xray  Result Date: 09/07/2022 CLINICAL DATA:  Respiratory failure EXAM: PORTABLE CHEST 1 VIEW COMPARISON:  Prior chest x-ray 09/10/2022 FINDINGS: Stable significant enlargement of the cardiopericardial silhouette. Atherosclerotic calcifications are present in the transverse aorta. The patient is intubated. The tip of the endotracheal tube is 7.3 cm above the carina. A gastric tube is present. The proximal side-hole projects over the GE junction. An implantable loop recorder projects over the left chest. Pulmonary vascular congestion without overt edema. Low lung volumes with bibasilar atelectasis. No pneumothorax. IMPRESSION: 1. Stable cardiomegaly and pulmonary vascular congestion without overt pulmonary edema. 2. Bibasilar atelectasis. 3. Intubated. The tip of the endotracheal tube is 7.3 cm above the carina. 4. Gastric tube is present. The proximal side-hole projects over the GE junction.  Electronically Signed   By: Jacqulynn Cadet M.D.   On: 09/07/2022 07:53   ECHOCARDIOGRAM COMPLETE  Result Date: 09/06/2022    ECHOCARDIOGRAM REPORT   Patient Name:   Vincent [name] Date of Exam: 09/06/2022 Medical Rec #:  [id number]      Height:       69.0 in Accession #:    [id number]     Weight:       166.9 lb Date of Birth:  1936/03/20       BSA:          1.913 m Patient Age:    87 years       BP:           113/50 mmHg Patient Gender: M              HR:           86 bpm. Exam Location:  Inpatient Procedure: 2D Echo, Cardiac Doppler, Color Doppler and Intracardiac            Opacification Agent Indications:    Stroke I63.9  History:        Patient has prior history of Echocardiogram examinations, most                 recent 03/29/2022. CHF, Previous Myocardial Infarction and CAD,                 Stroke and COPD, Arrythmias:Tachycardia, Atrial Fibrillation and  Bradycardia, Signs/Symptoms:Syncope, Hypotension and Chest Pain;                 Risk Factors:Hypertension, Diabetes and Dyslipidemia.  Sonographer:    Ronny Flurry Referring Phys: [place]  1. Left ventricular ejection fraction, by estimation, is 20 to 25%. The left ventricle has severely decreased function. The left ventricle demonstrates global hypokinesis. The left ventricular internal cavity size was severely dilated. Left ventricular diastolic function could not be evaluated.  2. Right ventricular systolic function is mildly reduced. The right ventricular size is mildly enlarged. There is moderately elevated pulmonary artery systolic pressure. The estimated right ventricular systolic pressure is 14.4 mmHg.  3. Left atrial size was severely dilated.  4. Right atrial size was severely dilated.  5. The mitral valve is grossly normal. Moderate mitral valve regurgitation.  6. The tricuspid valve is abnormal. Tricuspid valve regurgitation is moderate to severe.  7. The aortic valve is tricuspid. Aortic valve  regurgitation is not visualized. No aortic stenosis is present.  8. The inferior vena cava is dilated in size with <50% respiratory variability, suggesting right atrial pressure of 15 mmHg. Comparison(s): No significant change from prior study. Conclusion(s)/Recommendation(s): No left ventricular mural or apical thrombus/thrombi. FINDINGS  Left Ventricle: Left ventricular ejection fraction, by estimation, is 20 to 25%. The left ventricle has severely decreased function. The left ventricle demonstrates global hypokinesis. Definity contrast agent was given IV to delineate the left ventricular endocardial borders. The left ventricular internal cavity size was severely dilated. There is no left ventricular hypertrophy. Left ventricular diastolic function could not be evaluated due to atrial fibrillation. Left ventricular diastolic function could not be evaluated. Right Ventricle: The right ventricular size is mildly enlarged. No increase in right ventricular wall thickness. Right ventricular systolic function is mildly reduced. There is moderately elevated pulmonary artery systolic pressure. The tricuspid regurgitant velocity is 3.28 m/s, and with an assumed right atrial pressure of 15 mmHg, the estimated right ventricular systolic pressure is 31.5 mmHg. Left Atrium: Left atrial size was severely dilated. Right Atrium: Right atrial size was severely dilated. Pericardium: There is no evidence of pericardial effusion. Mitral Valve: The mitral valve is grossly normal. Moderate mitral valve regurgitation. MV peak gradient, 8.2 mmHg. The mean mitral valve gradient is 3.0 mmHg. Tricuspid Valve: The tricuspid valve is abnormal. Tricuspid valve regurgitation is moderate to severe. No evidence of tricuspid stenosis. Aortic Valve: The aortic valve is tricuspid. Aortic valve regurgitation is not visualized. No aortic stenosis is present. Aortic valve mean gradient measures 7.0 mmHg. Aortic valve peak gradient measures 12.8 mmHg.  Aortic valve area, by VTI measures 2.11  cm. Pulmonic Valve: The pulmonic valve was grossly normal. Pulmonic valve regurgitation is mild. No evidence of pulmonic stenosis. Aorta: The aortic root and ascending aorta are structurally normal, with no evidence of dilitation. Venous: The inferior vena cava is dilated in size with less than 50% respiratory variability, suggesting right atrial pressure of 15 mmHg. IAS/Shunts: The atrial septum is grossly normal.  LEFT VENTRICLE PLAX 2D LVIDd:         7.00 cm      Diastology LVIDs:         6.40 cm      LV e' medial:    4.37 cm/s LV PW:         1.10 cm      LV E/e' medial:  27.9 LV IVS:        0.80 cm  LV e' lateral:   6.87 cm/s LVOT diam:     2.00 cm      LV E/e' lateral: 17.8 LV SV:         71 LV SV Index:   37 LVOT Area:     3.14 cm  LV Volumes (MOD) LV vol d, MOD A4C: 179.0 ml LV vol s, MOD A4C: 135.0 ml LV SV MOD A4C:     179.0 ml RIGHT VENTRICLE             IVC RV S prime:     10.70 cm/s  IVC diam: 2.10 cm TAPSE (M-mode): 1.1 cm LEFT ATRIUM             Index        RIGHT ATRIUM           Index LA diam:        4.30 cm 2.25 cm/m   RA Area:     23.10 cm LA Vol (A2C):   69.7 ml 36.43 ml/m  RA Volume:   77.90 ml  40.72 ml/m LA Vol (A4C):   50.6 ml 26.45 ml/m LA Biplane Vol: 58.9 ml 30.79 ml/m  AORTIC VALVE                     PULMONIC VALVE AV Area (Vmax):    2.28 cm      PR End Diast Vel: 8.53 msec AV Area (Vmean):   2.11 cm AV Area (VTI):     2.11 cm AV Vmax:           179.00 cm/s AV Vmean:          129.000 cm/s AV VTI:            0.336 m AV Peak Grad:      12.8 mmHg AV Mean Grad:      7.0 mmHg LVOT Vmax:         129.67 cm/s LVOT Vmean:        86.833 cm/s LVOT VTI:          0.226 m LVOT/AV VTI ratio: 0.67  AORTA Ao Root diam: 3.40 cm Ao Asc diam:  2.60 cm MITRAL VALVE                TRICUSPID VALVE MV Area VTI:  2.48 cm      TR Peak grad:   43.0 mmHg MV Peak grad: 8.2 mmHg      TR Vmax:        328.00 cm/s MV Mean grad: 3.0 mmHg MV Vmax:      1.43 m/s       SHUNTS MV Vmean:     79.0 cm/s     Systemic VTI:  0.23 m MR Peak grad: 82.8 mmHg     Systemic Diam: 2.00 cm MR Mean grad: 48.0 mmHg MR Vmax:      455.00 cm/s MR Vmean:     327.0 cm/s MV E velocity: 122.00 cm/s Eleonore Chiquito MD Electronically signed by Eleonore Chiquito MD Signature Date/Time: 09/06/2022/1:46:33 PM    Final    MR BRAIN WO CONTRAST  Result Date: 09/06/2022 CLINICAL DATA:  Stroke follow-up. Postop thrombectomy basilar artery 09/06/2022 EXAM: MRI HEAD WITHOUT CONTRAST MRA HEAD WITHOUT CONTRAST TECHNIQUE: Multiplanar, multi-echo pulse sequences of the brain and surrounding structures were acquired without intravenous contrast. Angiographic images of the Circle of Willis were acquired using MRA technique without intravenous contrast. COMPARISON:  CT head and CT angio head  09/06/2022 FINDINGS: MRI HEAD FINDINGS Brain: Moderate to large area of acute infarction right inferior cerebellum. Moderately large area of acute infarct left superior cerebellum with smaller areas of infarct in the left mid cerebellum. Small area of acute infarct in the left pons. Small area of acute infarct right frontal white matter. . 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 Large area of chronic infarct right MCA territory involving the right posterior frontal and parietal lobe. Chronic blood products in the right MCA infarct. Chronic microvascular ischemic change in the white matter right greater than left. No  mass identified.  Generalized atrophy without hydrocephalus. Vascular: Normal arterial flow voids at the skull base. Skull and upper cervical spine: No focal skeletal abnormality Sinuses/Orbits: Mucosal edema paranasal sinuses. Air-fluid level right maxillary sinus. Bilateral cataract extraction Other: None MRA HEAD FINDINGS Anterior circulation: Internal carotid artery patent bilaterally without stenosis. Anterior and middle cerebral arteries patent bilaterally without significant  stenosis. Decreased signal in the middle cerebral artery M1 segment bilaterally felt to be due to vascular tortuosity. Posterior circulation: Right vertebral artery is tortuous and dominant and supplies the basilar. Right vertebral artery is patent. Right PICA not visualized. Left vertebral artery ends in PICA Basilar is patent. This is occluded prior to procedure today. Superior cerebellar and posterior cerebral arteries patent bilaterally. Fetal origin right posterior cerebral artery Anatomic variants: None IMPRESSION: 1. Multiple areas of acute infarct involving the cerebellum bilaterally, left pons, and right frontal white matter. 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 Large area of chronic infarct and mild chronic hemorrhage and in the right MCA territory. 2. 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 Right vertebral artery patent. Left vertebral artery ends in PICA. Electronically Signed   By: Franchot Gallo M.D.   On: 09/06/2022 11:50   MR ANGIO HEAD WO CONTRAST  Result Date: 09/06/2022 CLINICAL DATA:  Stroke follow-up. Postop thrombectomy basilar artery 09/06/2022 EXAM: MRI HEAD WITHOUT CONTRAST MRA HEAD WITHOUT CONTRAST TECHNIQUE: Multiplanar, multi-echo pulse sequences of the brain and surrounding structures were acquired without intravenous contrast. Angiographic images of the Circle of Willis were acquired using MRA technique without intravenous contrast. COMPARISON:  CT head and CT angio head 09/18/2022 FINDINGS: MRI HEAD FINDINGS Brain: Moderate to large area of acute infarction right inferior cerebellum. Moderately large area of acute infarct left superior cerebellum with smaller areas of infarct in the left mid cerebellum. Small area of acute infarct in the left pons. Small area of acute infarct right frontal white matter. . 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 Large area of chronic infarct right MCA  territory involving the right posterior frontal and parietal lobe. Chronic blood products in the right MCA infarct. Chronic microvascular ischemic change in the white matter right greater than left. No  mass identified.  Generalized atrophy without hydrocephalus. Vascular: Normal arterial flow voids at the skull base. Skull and upper cervical spine: No focal skeletal abnormality Sinuses/Orbits: Mucosal edema paranasal sinuses. Air-fluid level right maxillary sinus. Bilateral cataract extraction Other: None MRA HEAD FINDINGS Anterior circulation: Internal carotid artery patent bilaterally without stenosis. Anterior and middle cerebral arteries patent bilaterally without significant stenosis. Decreased signal in the middle cerebral artery M1 segment bilaterally felt to be due to vascular tortuosity. Posterior circulation: Right vertebral artery is tortuous and dominant and supplies the basilar. Right vertebral artery is patent. Right PICA not visualized. Left vertebral artery ends in PICA Basilar is patent. This is  occluded prior to procedure today. Superior cerebellar and posterior cerebral arteries patent bilaterally. Fetal origin right posterior cerebral artery Anatomic variants: None IMPRESSION: 1. Multiple areas of acute infarct involving the cerebellum bilaterally, left pons, and right frontal white matter. 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 Large area of chronic infarct and mild chronic hemorrhage and in the right MCA territory. 2. 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 Right vertebral artery patent. Left vertebral artery ends in PICA. Electronically Signed   By: Franchot Gallo M.D.   On: 09/06/2022 11:50   DG Abd 1 View  Result Date: 09/06/2022 CLINICAL DATA:  Orogastric tube placement. EXAM: ABDOMEN - 1 VIEW COMPARISON:  None Available. FINDINGS: Distal tip of nasogastric tube is seen in expected position of the stomach. IMPRESSION: Distal tip of  nasogastric tube is seen in expected position of the stomach. Electronically Signed   By: Marijo Conception M.D.   On: 09/06/2022 08:12   CT Angio Head W or Wo Contrast  Result Date: 09/06/2022 CLINICAL DATA:  Dizziness and vomiting. Right-sided gaze preference. Stroke workup. EXAM: CT ANGIOGRAPHY HEAD TECHNIQUE: Multidetector CT imaging of the head was performed using the standard protocol during bolus administration of intravenous contrast. Multiplanar CT image reconstructions and MIPs were obtained to evaluate the vascular anatomy. RADIATION DOSE REDUCTION: This exam was performed according to the departmental dose-optimization program which includes automated exposure control, adjustment of the mA and/or kV according to patient size and/or use of iterative reconstruction technique. CONTRAST:  39m OMNIPAQUE IOHEXOL 350 MG/ML SOLN COMPARISON:  Same-day noncontrast CT head. FINDINGS: CTA HEAD Anterior circulation: The imaged portions of the ICAs in the neck are patent. There is calcified plaque in the carotid siphons resulting in moderate to severe stenosis bilaterally. The right M1 segment is patent. There is multifocal high-grade stenosis of the right M2 and M3 branches corresponding to the chronic MCA distribution infarct (7-96, 7-105, 7-117). The ACAs are patent, without proximal high-grade stenosis or occlusion. The anterior communicating artery is normal. There is no aneurysm or AVM. The left M1 segment is patent. The distal left MCA branches appear overall patent, without proximal high-grade stenosis or occlusion. Posterior circulation: The left V4 segment is occluded there is calcified plaque in the proximal V4 segment resulting in moderate to severe stenosis. There is poor opacification of the basilar artery throughout with the apparent occlusion distally. The bilateral PCAs are supplied by posterior communicating arteries with likely retrograde flow into the P1 segments. There is no proximal high-grade  stenosis in the PCAs. There is no aneurysm or AVM. Venous sinuses: Suboptimally evaluated due to bolus timing. Anatomic variants: As above. Review of the MIP images confirms the above findings. IMPRESSION: 1. 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 The bilateral PCAs are supplied by posterior communicating arteries with likely retrograde flow into the P1 segments. 2. Multifocal high-grade stenosis of the right M2 and M3 branches relatively diminished perfusion corresponding to the chronic MCA distribution infarct. 3. Moderate to severe stenosis in the bilateral carotid siphons. These results were called by telephone at the time of interpretation on 09/26/2022 at 7:40 pm to provider Dr LCheral Marker who verbally acknowledged these results. Electronically Signed   By: PValetta MoleM.D.   On: 09/18/2022 19:41   [place]Abdomen Limited RUQ (LIVER/GB)  Result Date: 09/23/2022 CLINICAL DATA:  Vomiting EXAM: ULTRASOUND ABDOMEN LIMITED RIGHT UPPER QUADRANT COMPARISON:  CT 09/06/2022 earlier.  Older exams as well. FINDINGS: Gallbladder: Gallbladder is mildly distended with dependent stones. No wall thickening. There is some trace ascites. Common bile duct: Diameter: 3 mm Liver: No focal lesion identified. Within normal limits in parenchymal echogenicity. Portal vein is patent on color Doppler imaging with normal direction of blood flow towards the liver. Other: Trace free fluid. IMPRESSION: Distended gallbladder with stones. No ductal dilatation. No further sonographic evidence of acute cholecystitis at this time. Electronically Signed   By: Jill Side M.D.   On: 09/18/2022 18:30   CT Head Wo Contrast  Result Date: 09/02/2022 CLINICAL DATA:  Neck trauma (Age >= 65y); Head trauma, minor (Age >= 65y) EXAM: CT HEAD WITHOUT CONTRAST CT CERVICAL SPINE WITHOUT CONTRAST TECHNIQUE: Multidetector CT imaging of the head and cervical spine was  performed following the standard protocol without intravenous contrast. Multiplanar CT image reconstructions of the cervical spine were also generated. RADIATION DOSE REDUCTION: This exam was performed according to the departmental dose-optimization program which includes automated exposure control, adjustment of the mA and/or kV according to patient size and/or use of iterative reconstruction technique. COMPARISON:  CT head and C-spine 07/20/2022 FINDINGS: CT HEAD FINDINGS Brain: Cerebral ventricle sizes are concordant with the degree of cerebral volume loss. 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 Chronic bilateral cerebellar infarctions. Patchy and confluent areas of decreased attenuation are noted throughout the deep and periventricular white matter of the cerebral hemispheres bilaterally, compatible with chronic microvascular ischemic disease. No evidence of large-territorial acute infarction. No parenchymal hemorrhage. No mass lesion. No extra-axial collection. No mass effect or midline shift. No hydrocephalus. Basilar cisterns are patent. Vascular: No hyperdense vessel. Skull: No acute fracture or focal lesion. Sinuses/Orbits: Paranasal sinuses and mastoid air cells are clear. Bilateral lens replacement. Otherwise the orbits are unremarkable. Other: None. CT CERVICAL SPINE FINDINGS Alignment: Normal. Skull base and vertebrae: Multilevel moderate severe degenerative changes of the spine with associated posterior disc osteophyte complex formation at the C3-C4 level and C4-C5 level. Possible smaller posterior disc osteophyte complex formation at the C6-C7 level. No associated severe osseous neural foraminal or central canal stenosis. No acute fracture. No aggressive appearing focal osseous lesion or focal pathologic process. Soft tissues and spinal canal: No prevertebral fluid or swelling. No visible canal hematoma. Upper chest: Unremarkable. Other: None.  IMPRESSION: 1. No acute intracranial abnormality. 2. No acute displaced fracture or traumatic listhesis of the cervical spine. Electronically Signed   By: Iven Finn M.D.   On: 09/13/2022 17:32   CT Cervical Spine Wo Contrast  Result Date: 09/10/2022 CLINICAL DATA:  Neck trauma (Age >= 65y); Head trauma, minor (Age >= 65y) EXAM: CT HEAD WITHOUT CONTRAST CT CERVICAL SPINE WITHOUT CONTRAST TECHNIQUE: Multidetector CT imaging of the head and cervical spine was performed following the standard protocol without intravenous contrast. Multiplanar CT image reconstructions of the cervical spine were also generated. RADIATION DOSE REDUCTION: This exam was performed according to the departmental dose-optimization program which includes automated exposure control, adjustment of the mA and/or kV according to patient size and/or use of iterative reconstruction technique. COMPARISON:  CT head and C-spine 07/20/2022 FINDINGS: CT HEAD FINDINGS Brain: Cerebral ventricle sizes are concordant with the degree of cerebral volume loss. 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 Chronic bilateral cerebellar infarctions. Patchy and confluent areas of decreased attenuation are noted throughout the deep and periventricular white matter of the cerebral hemispheres bilaterally, compatible with chronic microvascular ischemic disease. No evidence  of large-territorial acute infarction. No parenchymal hemorrhage. No mass lesion. No extra-axial collection. No mass effect or midline shift. No hydrocephalus. Basilar cisterns are patent. Vascular: No hyperdense vessel. Skull: No acute fracture or focal lesion. Sinuses/Orbits: Paranasal sinuses and mastoid air cells are clear. Bilateral lens replacement. Otherwise the orbits are unremarkable. Other: None. CT CERVICAL SPINE FINDINGS Alignment: Normal. Skull base and vertebrae: Multilevel moderate severe degenerative changes of the spine with  associated posterior disc osteophyte complex formation at the C3-C4 level and C4-C5 level. Possible smaller posterior disc osteophyte complex formation at the C6-C7 level. No associated severe osseous neural foraminal or central canal stenosis. No acute fracture. No aggressive appearing focal osseous lesion or focal pathologic process. Soft tissues and spinal canal: No prevertebral fluid or swelling. No visible canal hematoma. Upper chest: Unremarkable. Other: None. IMPRESSION: 1. No acute intracranial abnormality. 2. No acute displaced fracture or traumatic listhesis of the cervical spine. Electronically Signed   By: Iven Finn M.D.   On: 09/17/2022 17:32   CT CHEST ABDOMEN PELVIS W CONTRAST  Result Date: 09/04/2022 CLINICAL DATA:  Sepsis.  Fall.  Unknown time down. EXAM: CT CHEST, ABDOMEN, AND PELVIS WITH CONTRAST TECHNIQUE: Multidetector CT imaging of the chest, abdomen and pelvis was performed following the standard protocol during bolus administration of intravenous contrast. RADIATION DOSE REDUCTION: This exam was performed according to the departmental dose-optimization program which includes automated exposure control, adjustment of the mA and/or kV according to patient size and/or use of iterative reconstruction technique. CONTRAST:  50m OMNIPAQUE IOHEXOL 350 MG/ML SOLN COMPARISON:  X-ray thoracic and lumbar spine 03/31/2020 FINDINGS: CHEST: Cardiovascular: No aortic injury. The thoracic aorta is normal in caliber. The heart is enlarged in size. No significant pericardial effusion. Atherosclerotic plaque. Four-vessel coronary calcification. Mediastinum/Nodes: No pneumomediastinum. No mediastinal hematoma. The esophagus is unremarkable. The thyroid is unremarkable. The central airways are patent. No mediastinal, hilar, or axillary lymphadenopathy. Lungs/Pleura: Peribronchovascular ground-glass airspace opacities of the lingula and bilateral lower lobes. No pulmonary nodule. No pulmonary mass. No  pulmonary contusion or laceration. No pneumatocele formation. Trace right pleural effusion. Possible tiny trace left pleural effusion. No pneumothorax. No hemothorax. Musculoskeletal/Chest wall: No chest wall mass. No acute rib or sternal fracture. Old healed sternal fracture. Old healed left rib fractures. No spinal fracture. Mild to moderate degenerative changes of the shoulders, right greater than left. ABDOMEN / PELVIS: Hepatobiliary: Not enlarged. Heterogeneous appearance of the hepatic parenchyma likely due to streak artifact originating from the bilateral upper extremities along the patient's sides. No focal lesion. No laceration or subcapsular hematoma. Calcified gallstones within the gallbladder lumen. Suggestion of fundal gallbladder wall thickening/pericholecystic fluid. No biliary ductal dilatation. Pancreas: Normal pancreatic contour. No main pancreatic duct dilatation. Spleen: Not enlarged. No focal lesion. No laceration, subcapsular hematoma, or vascular injury. Adrenals/Urinary Tract: No nodularity bilaterally. Bilateral kidneys enhance symmetrically. No hydronephrosis. No contusion, laceration, or subcapsular hematoma. Fluid dense lesion of the right kidney likely represents a simple renal cyst. Simple renal cysts, in the absence of clinically indicated signs/symptoms, require no independent follow-up. No injury to the vascular structures or collecting systems. No hydroureter. The urinary bladder is unremarkable. On delayed imaging, there is no urothelial wall thickening and there are no filling defects in the opacified portions of the bilateral collecting systems or ureters. Stomach/Bowel: No small or large bowel wall thickening or dilatation. Colonic diverticulosis. The appendix is unremarkable. Vasculature/Lymphatics: Severe atherosclerotic plaque. No abdominal aorta or iliac aneurysm. No active contrast extravasation or pseudoaneurysm. No abdominal, pelvic,  inguinal lymphadenopathy.  Reproductive: Prostate is borderline enlarged in size. Partially visualized at least small to moderate volume right hydrocele. Other: No simple free fluid ascites. No pneumoperitoneum. No hemoperitoneum. No mesenteric hematoma identified. No organized fluid collection. Musculoskeletal: No significant soft tissue hematoma. No acute pelvic fracture. No acute spinal fracture. Chronic compression fracture of the L1 and L4 vertebral bodies grossly stable in appearance. Grade 1 anterolisthesis of L3 on L4 and L4 on L5. Multilevel degenerative changes of the spine. Ports and Devices: None. IMPRESSION: 1. Peribronchovascular ground-glass airspace opacities of the lingula and bilateral lower lobes. Findings suggestive of infection/inflammation and consistent with chest x-ray. Followup PA and lateral chest X-ray is recommended in 3-4 weeks following therapy to ensure resolution. 2. Trace right pleural effusion. Possible tiny trace left pleural effusion. 3. Cholelithiasis with suggestion of fundal gallbladder wall thickening/pericholecystic fluid. Correlate with liver function tests. Consider right upper quadrant ultrasound for a more sensitive evaluation of the gallbladder if clinically indicated. 4. No acute traumatic injury to the chest, abdomen, or pelvis. 5. No acute fracture or traumatic malalignment of the thoracic or lumbar spine. 6. Other imaging findings of potential clinical significance: Colonic diverticulosis with no acute diverticulitis. Cardiomegaly. Borderline prostatomegaly. Partially visualized at least small to moderate volume right hydrocele .Aortic Atherosclerosis (ICD10-I70.0) -including four-vessel coronary artery calcification. Electronically Signed   By: Iven Finn M.D.   On: 09/13/2022 17:26   DG Chest Port 1 View  Result Date: 09/28/2022 CLINICAL DATA:  Shortness of breath and weakness. EXAM: PORTABLE CHEST 1 VIEW COMPARISON:  07/20/2022 FINDINGS: Patient is slightly rotated to the right.  Lungs are adequately inflated demonstrate subtle hazy prominence of the pulmonary vasculature as well as minimal bibasilar density. Findings likely due to mild interstitial edema/vascular congestion. Not exclude infection in the lung bases. Stable cardiomegaly. Metallic device projects over the anterior left chest wall unchanged. Remainder of the exam is unchanged. IMPRESSION: 1. Findings suggesting mild interstitial edema/vascular congestion. Infection in the lung bases is possible. 2. Stable cardiomegaly. Electronically Signed   By: Marin Olp M.D.   On: 09/04/2022 15:35    Labs:  CBC: Recent Labs    09/07/2022 1520 09/18/2022 1547 09/06/22 0926 09/07/22 0232 09/08/22 0551  WBC 3.8*  --  10.8* 7.3 5.6  HGB 11.1* 12.2*  12.2* 9.5* 10.4* 9.9*  HCT 36.7* 36.0*  36.0* 30.3* 33.5* 33.3*  PLT 149*  --  140* 144* 147*    COAGS: Recent Labs    09/17/21 1317 09/18/21 0147 06/10/22 1547 09/06/22 0926 09/06/22 1238  INR 1.5* 1.4* 1.3*  --   --   APTT  --   --   --  176* >200*    BMP: Recent Labs    09/06/22 0926 09/07/22 0232 09/07/22 0837 09/08/22 0551  NA 139 140 140 141  K 3.2* 3.7 3.7 3.6  CL 108 109 107 111  CO2 '25 24 22 23  '$ GLUCOSE 90 89 91 170*  BUN 6* '8 8 15  '$ CALCIUM 7.9* 8.2* 8.3* 8.2*  CREATININE 0.84 0.94 0.87 0.87  GFRNONAA >60 >60 >60 >60    LIVER FUNCTION TESTS: Recent Labs    09/18/21 0147 07/23/22 0259 09/06/2022 1520 09/07/22 0232  BILITOT 2.5* 2.2* 1.8* 2.1*  AST 42* 20 64* 33  ALT 33 19 48* 30  ALKPHOS 86 67 144* 110  PROT 6.6 6.1* 6.9 5.9*  ALBUMIN 3.5 2.8* 3.3* 2.7*    Assessment and Plan:  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 Estanislado Pandy.  He is awake and following some commands. He is moving left side more than right. Currently back on full ventilator support.   Further treatment deferred to primary team.   Electronically Signed: Pasty Spillers, PA 09/08/2022, 11:53 AM   I spent a  total of 15 Minutes at the the patient's bedside AND on the patient's hospital floor or unit, greater than 50% of which was counseling/coordinating care for post stroke intervention follow up

## 2022-09-08 NOTE — Progress Notes (Signed)
PT Cancellation Note  Patient Details Name: Vincent [name] MRN: [id number] DOB: Sep 30, 1935   Cancelled Treatment:    Reason Eval/Treat Not Completed: Patient not medically ready.  Medical issues which prohibited therapy (Per RT coughing up blood- asking to hold at this time.)  09/08/2022  Ginger Carne., PT Acute Rehabilitation Services [phone]  (office)   Tessie Fass Rashada Klontz 09/08/2022, 5:40 PM

## 2022-09-08 NOTE — Progress Notes (Signed)
OT Cancellation Note  Patient Details Name: Vincent [name] MRN: [id number] DOB: November 24, 1935   Cancelled Treatment:    Reason Eval/Treat Not Completed: Medical issues which prohibited therapy (Per RT coughing up blood- asking to hold at this time.)  [facility] 09/08/2022, 11:41 AM Maurie Boettcher, OT/L   Acute OT Clinical Specialist Acute Rehabilitation Services Pager [phone] Office [phone]

## 2022-09-09 ENCOUNTER — Inpatient Hospital Stay (HOSPITAL_COMMUNITY): Payer: Medicare Other

## 2022-09-09 DIAGNOSIS — W19XXXA Unspecified fall, initial encounter: Secondary | ICD-10-CM | POA: Diagnosis not present

## 2022-09-09 DIAGNOSIS — G9340 Encephalopathy, unspecified: Secondary | ICD-10-CM

## 2022-09-09 DIAGNOSIS — I482 Chronic atrial fibrillation, unspecified: Secondary | ICD-10-CM | POA: Diagnosis not present

## 2022-09-09 DIAGNOSIS — J690 Pneumonitis due to inhalation of food and vomit: Secondary | ICD-10-CM | POA: Diagnosis not present

## 2022-09-09 DIAGNOSIS — Z9911 Dependence on respirator [ventilator] status: Secondary | ICD-10-CM

## 2022-09-09 DIAGNOSIS — I651 Occlusion and stenosis of basilar artery: Secondary | ICD-10-CM | POA: Diagnosis not present

## 2022-09-09 LAB — CBC
HCT: 33.1 % — ABNORMAL LOW (ref 39.0–52.0)
Hemoglobin: 10.2 g/dL — ABNORMAL LOW (ref 13.0–17.0)
MCH: 26.8 pg (ref 26.0–34.0)
MCHC: 30.8 g/dL (ref 30.0–36.0)
MCV: 87.1 fL (ref 80.0–100.0)
Platelets: 136 10*3/uL — ABNORMAL LOW (ref 150–400)
RBC: 3.8 MIL/uL — ABNORMAL LOW (ref 4.22–5.81)
RDW: 18.4 % — ABNORMAL HIGH (ref 11.5–15.5)
WBC: 5.4 10*3/uL (ref 4.0–10.5)
nRBC: 0 % (ref 0.0–0.2)

## 2022-09-09 LAB — BASIC METABOLIC PANEL
Anion gap: 8 (ref 5–15)
BUN: 17 mg/dL (ref 8–23)
CO2: 26 mmol/L (ref 22–32)
Calcium: 8.1 mg/dL — ABNORMAL LOW (ref 8.9–10.3)
Chloride: 108 mmol/L (ref 98–111)
Creatinine, Ser: 0.84 mg/dL (ref 0.61–1.24)
GFR, Estimated: >60 mL/min (ref >60)
Glucose, Bld: 167 mg/dL — ABNORMAL HIGH (ref 70–99)
Potassium: 3.6 mmol/L (ref 3.5–5.1)
Sodium: 142 mmol/L (ref 135–145)

## 2022-09-09 LAB — GLUCOSE, CAPILLARY
Glucose-Capillary: 128 mg/dL — ABNORMAL HIGH (ref 70–99)
Glucose-Capillary: 132 mg/dL — ABNORMAL HIGH (ref 70–99)
Glucose-Capillary: 156 mg/dL — ABNORMAL HIGH (ref 70–99)
Glucose-Capillary: 156 mg/dL — ABNORMAL HIGH (ref 70–99)
Glucose-Capillary: 162 mg/dL — ABNORMAL HIGH (ref 70–99)
Glucose-Capillary: 181 mg/dL — ABNORMAL HIGH (ref 70–99)
Glucose-Capillary: 183 mg/dL — ABNORMAL HIGH (ref 70–99)

## 2022-09-09 LAB — PHOSPHORUS: Phosphorus: 2.5 mg/dL (ref 2.5–4.6)

## 2022-09-09 MED ORDER — SENNOSIDES-DOCUSATE SODIUM 8.6-50 MG PO TABS: 1.0000 | ORAL_TABLET | Freq: Every evening | ORAL | Status: DC | PRN

## 2022-09-09 MED ORDER — POTASSIUM CHLORIDE 20 MEQ PO PACK
40.0000 meq | PACK | ORAL | Status: AC
  Administered 2022-09-09 (×2): 40 meq
  Filled 2022-09-09 (×2): qty 2

## 2022-09-09 NOTE — Progress Notes (Signed)
NAME:  Vincent [name], MRN:  [id number], DOB:  1936/07/29, LOS: 4 ADMISSION DATE:  09/08/2022, CONSULTATION DATE:  09/06/2022 REFERRING MD:  Marjory Lies - NIR CHIEF COMPLAINT:  S/p basilar artery occlusion, vent Management   History of Present Illness:  87 year old man who presented to [facility] ED 1/8 as a Code Stroke after being found down at home. Presented with R gaze preference, L-sided deficits and dysarthria. PMHx significant for HTN, HLD, AF (on Eliquis), CHF (EF 25-30%), CAD s/p stent (2004), prior CVA with residual L-sided deficits, COPD, T2DM.  CTA Head/Neck demonstrated distal basilar artery occlusion, left V4 occlusion. He was seen by neurology and IR and was taken to IR for revascularization of distal basilar artery; TICI 2C revascularization achieved. Post-procedure CT negative for hemorrhage.  Post-procedure, he was left intubated and was transferred to ICU.   PCCM was asked to see for vent management.  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 s/p remote stent to Cx 2004 with chronic stable angina in context of residual circumflex disease.   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 1/11 agitated with vent wean  Interim History / Subjective:  Less interactive today, stat head CT ordered, no acute findings 2L UOP after lasix   Objective:  Blood pressure (!) 146/59, pulse (!) 50, temperature 98.8 F (37.1 C), temperature source Axillary, resp. rate 15, height '5\' 9"'$  (1.753 m), weight 75.7 kg, SpO2 100 %.    Vent Mode: PRVC FiO2 (%):  [40 %] 40 % Set Rate:  [15 bmp] 15 bmp Vt Set:  [560 mL] 560 mL PEEP:  [5 cmH20] 5 cmH20 Plateau Pressure:  [13 cmH20-18 cmH20] 18 cmH20   Intake/Output Summary (Last 24 hours) at 09/09/2022 1448 Last data filed at 09/09/2022 0800 Gross per 24 hour  Intake 1836.38 ml  Output 3350 ml  Net -1513.62 ml    Filed Weights   09/06/22 0200 09/06/22 0432  Weight: 84.2 kg 75.7 kg     General:  acutely ill elderly M intubated full vent support  HEENT: MM pink/moist, pupils equal and reactive Neuro: opens eyes to voice, not following commands for me on low dose precedex CV: s1s2 rrr, no m/r/g PULM:  mechanically ventilated with mild basilar rhonchi bilaterally  GI: soft, bsx4 active  GU: dark red urine in foley bag without clots Extremities: warm/dry, no edema  Skin: no rashes or lesions     Assessment & Plan:   Basilar artery occlusion Acute Encephalopathy  -  s/p IR revascularization. CTA Head/Neck demonstrated distal basilar artery occlusion, left V4 occlusion. Taken to IR for revascularization of distal basilar artery; TICI 2C revascularization. Post-procedure CT negative for hemorrhage. - Stroke team primary - Post-procedure management per IR -repeat head CT today without acute findings  - Goal SBP 120-140 - agitated yesterday and more lethargic today, titrate down precedex - Echo with EF 20-25% similar to prior - Neuroprotective measures: HOB > 30 degrees, normoglycemia,  normothermia, electrolytes WNL  Respiratory insufficiency  - due to inability to protect the airway in the setting of above. Hx COPD Hemoptysis -mental status remains barrier to vent weaning and extubation -wean precedex as able  -Maintain full vent support with SAT/SBT as tolerated -titrate Vent setting to maintain SpO2 greater than or equal to 90%. -HOB elevated 30 degrees. -Plateau pressures less than 30 cm H20.  -Follow chest x-ray, ABG prn.   -Bronchial hygiene and RT/bronchodilator protocol. -continue Lasix, increase to '60mg'$  qd  -respiratory culture pending, no infiltrate on CXR yesterday, trend fever curve   Hx A.fib on Eliquis, sCHF (echo from Aug 2023 with EF 25%-30%) CAD s/p stenting HTN HLD HFrEF - continue holding heparin gtt, resume per neuro -Echo with EF 25-30% similar to prior - BP management as above - Continue statin, resume digoxin as appropriate - Hold spironolactone, Imdur, Entresto, Coreg; resume as clinically appropriate  Hx DM Hemoglobin A1C 6.5% - SSI - CBGs Q4H - Goal CBG 140-180 - Hold home medications   Hematuria -pt pulled at foley yesterday, perhaps secondary to trauma -no clots -monitor   Best practice (evaluated daily):  Diet/type: TF DVT prophylaxis: SCD GI prophylaxis: PPI Lines: Arterial Line Foley:  Yes, and it is still needed Code Status:  full code Last date of multidisciplinary goals of care discussion: Per primary,   Critical care time: 40 minutes   The patient is critically ill with multiple organ system failure and requires high complexity decision making for assessment and support, frequent evaluation and titration of therapies, advanced monitoring, review of radiographic studies and interpretation of complex data.   Critical Care Time devoted to patient care services, exclusive of separately billable procedures, described in this note is 41 minutes.  Otilio Carpen Kalvin Buss, PA-C [facility] 09/09/22  8:23 AM  Please see [email or web address] for pager details.  From 7A-7P if no response, please call [phone] After hours, please call ELink [phone]

## 2022-09-09 NOTE — Progress Notes (Signed)
  Transition of Care [facility]) Screening Note   Patient Details  Name: Vincent [name] Date of Birth: [birth year]   Transition of Care [facility]) CM/SW Contact:    Benard Halsted, LCSW Phone Number: 09/09/2022, 9:59 AM    Transition of Care Department [facility]) has reviewed patient who is still intubated. We will continue to monitor patient advancement through interdisciplinary progression rounds. If new patient transition needs arise, please place a TOC consult.

## 2022-09-09 NOTE — Progress Notes (Signed)
OT Cancellation Note  Patient Details Name: Vincent [name] MRN: [id number] DOB: 1936-08-14   Cancelled Treatment:    Reason Eval/Treat Not Completed: Patient not medically ready (respiratory related concerns stated from RN staff  - requesting to home)  [name] 09/09/2022, 12:10 PM

## 2022-09-09 NOTE — Progress Notes (Addendum)
STROKE TEAM PROGRESS NOTE   INTERVAL HISTORY Pt still intubated and on vent. No family at the bedside.  More drowsy today. Difficult to wake, but tachypnea during wean Nods head to voice, flicker of muscle in R foot and right hand.  On low dose precedex  Vitals:   09/09/22 0500 09/09/22 0600 09/09/22 0700 09/09/22 0800  BP: (!) 151/63 (!) 143/64 (!) 146/57 (!) 146/59  Pulse: (!) 48 (!) 53 (!) 54 (!) 50  Resp: '15 15 15 15  '$ Temp:      TempSrc:      SpO2: 100% 100% 100% 100%  Weight:      Height:       CBC:  Recent Labs  Lab 09/15/2022 1520 09/26/2022 1547 09/06/22 0926 09/07/22 0232 09/08/22 1129 09/09/22 0454  WBC 3.8*  --  10.8*   < > 5.3 5.4  NEUTROABS 2.9  --  9.8*  --   --   --   HGB 11.1*   < > 9.5*   < > 8.8* 10.2*  HCT 36.7*   < > 30.3*   < > 28.3* 33.1*  MCV 88.2  --  85.8   < > 86.5 87.1  PLT 149*  --  140*   < > 145* 136*   < > = values in this interval not displayed.    Basic Metabolic Panel:  Recent Labs  Lab 09/08/22 0551 09/08/22 1700 09/09/22 0454  NA 141  --  142  K 3.6  --  3.6  CL 111  --  108  CO2 23  --  26  GLUCOSE 170*  --  167*  BUN 15  --  17  CREATININE 0.87  --  0.84  CALCIUM 8.2*  --  8.1*  MG 2.0 2.0  --   PHOS 2.3* 2.1* 2.5    Lipid Panel:  Recent Labs  Lab 09/06/22 0211 09/07/22 0232  CHOL 129  --   TRIG 75 81  HDL 58  --   CHOLHDL 2.2  --   VLDL 15  --   LDLCALC 56  --     HgbA1c:  Recent Labs  Lab 09/06/22 0211  HGBA1C 6.5*    Urine Drug Screen: No results for input(s): "LABOPIA", "COCAINSCRNUR", "LABBENZ", "AMPHETMU", "THCU", "LABBARB" in the last 168 hours.  Alcohol Level No results for input(s): "ETH" in the last 168 hours.  IMAGING past 24 hours CT HEAD WO CONTRAST (5MM)  Result Date: 09/08/2022 CLINICAL DATA:  Stroke follow-up EXAM: CT HEAD WITHOUT CONTRAST TECHNIQUE: Contiguous axial images were obtained from the base of the skull through the vertex without intravenous contrast. RADIATION DOSE REDUCTION:  This exam was performed according to the departmental dose-optimization program which includes automated exposure control, adjustment of the mA and/or kV according to patient size and/or use of iterative reconstruction technique. COMPARISON:  MRI Brain 09/06/22 FINDINGS: Brain: Redemonstrated is a large chronic right MCA territory infarct. Redemonstrated evolving bilateral cerebellar infarcts. There is increased conspicuity of the pontine infarct seen on prior brain MRI. Ex vacuo dilatation of the right lateral ventricular system. No hydrocephalus. No extra-axial fluid collection. No hemorrhage. Vascular: No hyperdense vessel or unexpected calcification. Skull: Normal. Negative for fracture or focal lesion. Sinuses/Orbits: Bilateral lens replacement. Mild mucosal thickening in the bilateral ethmoid and left sphenoid sinus. No middle ear or mastoid effusion. Partially visualized enteric tube place. Other: None IMPRESSION: 1. Redemonstrated evolving bilateral cerebellar infarcts with increased conspicuity of the pontine infarct seen on prior brain  MRI. 2. Redemonstrated large chronic right MCA territory infarct. 3. No hemorrhage or CT evidence of a new infarct. Electronically Signed   By: Marin Roberts M.D.   On: 09/08/2022 11:18   DG CHEST PORT 1 VIEW  Result Date: 09/08/2022 CLINICAL DATA:  Hemoptysis EXAM: PORTABLE CHEST 1 VIEW COMPARISON:  09/07/2022 FINDINGS: Endotracheal tube terminates at the level of the clavicular heads. Enteric tube courses below the diaphragm with distal tip beyond the inferior margin of the film. Stable cardiomegaly. Mild pulmonary vascular congestion. Similar mild perihilar and bibasilar interstitial prominence without new airspace consolidation. No pleural effusion or pneumothorax. IMPRESSION: Stable radiographic appearance of the chest. Cardiomegaly with mild pulmonary vascular congestion. Electronically Signed   By: Davina Poke D.O.   On: 09/08/2022 11:13    PHYSICAL  EXAM  General - Well nourished, well developed, intubated off sedation.   Ophthalmologic - fundi not visualized due to noncooperation.   Cardiovascular - irregularly irregular heart rate and rhythm.   Neuro - intubated off propofol, eyes open on voice, following simple commands. Eyes in more left gaze preference position, slowly tracking to right but not complete right gaze, pupils 44m and reactive to light. Corneal reflex present, gag and cough present. Breathing over the vent.   Facial symmetry not able to test due to ET tube.  Tongue protrusion not cooperative. Follows simple commands and against gravity BUEs, wiggle toes on b/l feet. DTR 1+ and no babinski. Sensation, coordination not cooperative and gait not tested.     ASSESSMENT/PLAN Mr. DKYO COCUZZAis a 87y.o. male with history of  alcoholic cardiomyopathy, permanent atrial fibrillation on Eliquis, CHF with an EF 25 to 30% with global hypokinesis, CAD s/p stenting, DM2, prior stroke, chronic bilateral lower extremity wounds with edema, LLE peroneal neuropathy with chronic foot drop, COPD, HLD and HTN who presented to the ED via EMS this afternoon after he was found down.  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  CTA head & neck Occlusion of the left V4 segment and poor opacification of the basilar artery with apparent occlusion distally, age indeterminate. The bilateral PCAs are supplied by posterior communicating arteries with likely retrograde flow into the P1 segments.Multifocal high-grade stenosis of the right M2 and M3 branches   S/p IR with TICI2c reperfusion MRI - Multiple areas of acute infarct involving the cerebellum bilaterally, left pons, and right frontal white matter. Chronic infarcts in the cerebellum bilaterally with evidence of  prior hemorrhage. Large area of chronic infarct and mild chronic hemorrhage and in the right MCA territory.  MRA  - Basilar artery is patent now.   1/11- CT repeat - no hydrocephalus  1/12- CT repeat - no hydrocephalus 2D Echo EF 20-25%  LDL 56 HgbA1c 6.5 VTE prophylaxis - Heparin subq Eliquis (apixaban) daily prior to admission, now on ASA. Plan to resume AC with heparin IV over the weekend. Therapy recommendations:  CIR Disposition:  Pending  Chronic Atrial Fibrillation  Home med: Eliquis Rate controlled now on ASA for now. Plan to resume AC with heparin IV over the weekend.  Congestive heart failure Alcoholic cardiomyopathy 86/2694EF 25 to 30%.  This admission EF 20 to 25%. Home meds including Imdur, digoxin, Jardiance, lasix On digoxin CCM managing Pt follows with Dr. MAundra Dubinas outpt  Hypertension Home meds:  Lasix, coreg, aldactone Stable BP goal < 180/105 Long-term BP goal normotensive  Post operative respiratory insufficiency  Remains intubated post procedure CCM to manage ventilator settings Hx of COPD Extubate as able  Hyperlipidemia Home meds:  Atorvastatin '80mg'$   LDL 56, goal < 70 AST/ALT 64/48-> 33/30 Resumed lipitor 80 Continue statin at discharge  Diabetes type II Controlled Home meds:  Jardiance, metformin HgbA1c 6.5, goal < 7.0 CBGs SSI  Other Stroke Risk Factors Advanced Age >/= 5  Hx stroke/TIA Evidence of chronic infarcts on imaging Coronary artery disease status post stenting patient  Other Active Problems Chronic BLE wound Chronic b/l foot drop Recent left hip fracture - walking with walker at home Leukopenia WBC 3.8->7.3->5.6 -> 3.8->5.4 Hypokalemia K 3.2 -> 3.7->3.6   Patient seen and examined by NP/APP with MD. MD to update note as needed.   Janine Ores, DNP, FNP-BC [facility] Pager: [phone]  ATTENDING NOTE: I reviewed above note and agree with the assessment and plan. Pt was seen and examined.   No  family at bedside.  Patient still intubated, did not pass SBT due to agitation, tachycardia, tachypnea, received Precedex and now calm.  However, more lethargic and drowsy sleepy than yesterday, only barely follows a couple of simple commands, not moving extremities on request.  Repeat CT showed stable expected evolution of strokes, no hemorrhagic transformation or hydrocephalus.  Continue aspirin and statin.  Plan for resume anticoagulation with heparin IV over the weekend.  Discussed with CCM Dr. Lynetta Mare.  For detailed assessment and plan, please refer to above/below as I have made changes wherever appropriate.   This patient is critically ill due to posterior circulation large infarct, basilar artery occlusion status post thrombectomy, congestive heart failure, chronic A-fib, respiratory failure and at significant risk of neurological worsening, death form recurrent stroke, hemorrhagic transmission, hydrocephalus, heart failure, seizure. This patient's care requires constant monitoring of vital signs, hemodynamics, respiratory and cardiac monitoring, review of multiple databases, neurological assessment, discussion with family, other specialists and medical decision making of high complexity. I spent 40 minutes of neurocritical care time in the care of this patient.  Rosalin Hawking, MD PhD Stroke Neurology 09/09/2022 7:06 PM     To contact Stroke Continuity provider, please refer to [email or web address] After hours, contact General Neurology

## 2022-09-09 NOTE — Progress Notes (Signed)
Transported to and from 4N28 to CT2 on vent. Pt stable throughout with no complications. VS WNL.

## 2022-09-09 NOTE — Progress Notes (Signed)
PT Cancellation Note  Patient Details Name: TANDY LEWIN MRN: [id number] DOB: Mar 11, 1936   Cancelled Treatment:    Reason Eval/Treat Not Completed: Patient not medically ready.  RN and RT both held pt today.  Will see as able 1/12. 09/09/2022  Ginger Carne., PT Acute Rehabilitation Services [phone]  (office)   Tessie Fass Morty Ortwein 09/09/2022, 3:17 PM

## 2022-09-10 ENCOUNTER — Inpatient Hospital Stay (HOSPITAL_COMMUNITY): Payer: Medicare Other

## 2022-09-10 DIAGNOSIS — Z9889 Other specified postprocedural states: Secondary | ICD-10-CM | POA: Diagnosis not present

## 2022-09-10 DIAGNOSIS — I639 Cerebral infarction, unspecified: Secondary | ICD-10-CM | POA: Diagnosis not present

## 2022-09-10 LAB — POCT I-STAT 7, (LYTES, BLD GAS, ICA,H+H)
Acid-Base Excess: 3 mmol/L — ABNORMAL HIGH (ref 0.0–2.0)
Bicarbonate: 27.7 mmol/L (ref 20.0–28.0)
Calcium, Ion: 1.2 mmol/L (ref 1.15–1.40)
HCT: 34 % — ABNORMAL LOW (ref 39.0–52.0)
Hemoglobin: 11.6 g/dL — ABNORMAL LOW (ref 13.0–17.0)
O2 Saturation: 99 %
Patient temperature: 98.2
Potassium: 4.3 mmol/L (ref 3.5–5.1)
Sodium: 146 mmol/L — ABNORMAL HIGH (ref 135–145)
TCO2: 29 mmol/L (ref 22–32)
pCO2 arterial: 40.5 mmHg (ref 32–48)
pH, Arterial: 7.442 (ref 7.35–7.45)
pO2, Arterial: 132 mmHg — ABNORMAL HIGH (ref 83–108)

## 2022-09-10 LAB — BASIC METABOLIC PANEL
Anion gap: 10 (ref 5–15)
BUN: 20 mg/dL (ref 8–23)
CO2: 27 mmol/L (ref 22–32)
Calcium: 8.4 mg/dL — ABNORMAL LOW (ref 8.9–10.3)
Chloride: 107 mmol/L (ref 98–111)
Creatinine, Ser: 0.85 mg/dL (ref 0.61–1.24)
GFR, Estimated: >60 mL/min (ref >60)
Glucose, Bld: 171 mg/dL — ABNORMAL HIGH (ref 70–99)
Potassium: 3.8 mmol/L (ref 3.5–5.1)
Sodium: 144 mmol/L (ref 135–145)

## 2022-09-10 LAB — CBC
HCT: 34.7 % — ABNORMAL LOW (ref 39.0–52.0)
Hemoglobin: 10.4 g/dL — ABNORMAL LOW (ref 13.0–17.0)
MCH: 26.4 pg (ref 26.0–34.0)
MCHC: 30 g/dL (ref 30.0–36.0)
MCV: 88.1 fL (ref 80.0–100.0)
Platelets: 142 10*3/uL — ABNORMAL LOW (ref 150–400)
RBC: 3.94 MIL/uL — ABNORMAL LOW (ref 4.22–5.81)
RDW: 18.3 % — ABNORMAL HIGH (ref 11.5–15.5)
WBC: 6.6 10*3/uL (ref 4.0–10.5)
nRBC: 0 % (ref 0.0–0.2)

## 2022-09-10 LAB — CULTURE, RESPIRATORY W GRAM STAIN
Culture: NORMAL
Gram Stain: NONE SEEN

## 2022-09-10 LAB — GLUCOSE, CAPILLARY
Glucose-Capillary: 164 mg/dL — ABNORMAL HIGH (ref 70–99)
Glucose-Capillary: 155 mg/dL — ABNORMAL HIGH (ref 70–99)
Glucose-Capillary: 175 mg/dL — ABNORMAL HIGH (ref 70–99)
Glucose-Capillary: 144 mg/dL — ABNORMAL HIGH (ref 70–99)
Glucose-Capillary: 165 mg/dL — ABNORMAL HIGH (ref 70–99)

## 2022-09-10 MED ORDER — HEPARIN (PORCINE) 25000 UT/250ML-% IV SOLN
850.0000 [IU]/h | INTRAVENOUS | Status: DC
  Administered 2022-09-10: 800 [IU]/h via INTRAVENOUS
  Administered 2022-09-11: 850 [IU]/h via INTRAVENOUS
  Filled 2022-09-10 (×2): qty 250

## 2022-09-10 MED ORDER — MIDAZOLAM HCL 2 MG/2ML IJ SOLN: 2.0000 mg | Freq: Once | INTRAMUSCULAR | Status: AC

## 2022-09-10 MED ORDER — SACUBITRIL-VALSARTAN 97-103 MG PO TABS
1.0000 | ORAL_TABLET | Freq: Two times a day (BID) | ORAL | Status: DC
  Administered 2022-09-10 – 2022-09-19 (×19): 1
  Filled 2022-09-10 (×20): qty 1

## 2022-09-10 MED ORDER — SPIRONOLACTONE 25 MG PO TABS
25.0000 mg | ORAL_TABLET | Freq: Every day | ORAL | Status: DC
  Administered 2022-09-10 – 2022-09-19 (×10): 25 mg
  Filled 2022-09-10 (×10): qty 1

## 2022-09-10 MED ORDER — METOPROLOL TARTRATE 5 MG/5ML IV SOLN: 2.5000 mg | Freq: Once | INTRAVENOUS | Status: DC

## 2022-09-10 MED ORDER — FENTANYL CITRATE PF 50 MCG/ML IJ SOSY
50.0000 ug | PREFILLED_SYRINGE | Freq: Once | INTRAMUSCULAR | Status: AC
  Administered 2022-09-10: 50 ug via INTRAVENOUS

## 2022-09-10 MED ORDER — MIDAZOLAM HCL 2 MG/2ML IJ SOLN
INTRAMUSCULAR | Status: AC
  Administered 2022-09-10: 2 mg via INTRAVENOUS
  Filled 2022-09-10: qty 2

## 2022-09-10 MED ORDER — HYDRALAZINE HCL 20 MG/ML IJ SOLN: 10.0000 mg | INTRAMUSCULAR | Status: DC | PRN

## 2022-09-10 MED ORDER — POTASSIUM CHLORIDE 20 MEQ PO PACK
40.0000 meq | PACK | Freq: Once | ORAL | Status: AC
  Administered 2022-09-10: 40 meq
  Filled 2022-09-10: qty 2

## 2022-09-10 NOTE — Progress Notes (Signed)
PT noted to be diaphoretic and tachypnea. RT called, pt switched back to [facility].

## 2022-09-10 NOTE — Progress Notes (Signed)
Pts sats noted to be dropping into the 80s with high peak pressure. RT notified, lavage done to no avail. RT at bedside to do ambu lavage. Pts RR 40s, HR 140s. APP aware and at bedside. Orders noted, meds given, see MAR. Pts sister at bedside and updated.

## 2022-09-10 NOTE — Progress Notes (Signed)
PT Cancellation Note  Patient Details Name: Vincent [name] MRN: [id number] DOB: Oct 21, 1935   Cancelled Treatment:    Reason Eval/Treat Not Completed: Patient not medically ready. RN asked to hold as attempting to wean off vent however pt also with pink foamy sputum as well. Acute PT to return as able/pt appropriate to progress mobility.  Kittie Plater, PT, DPT Acute Rehabilitation Services Secure chat preferred Office #: [phone]    Berline Lopes 09/10/2022, 8:56 AM

## 2022-09-10 NOTE — Progress Notes (Signed)
STROKE TEAM PROGRESS NOTE   INTERVAL HISTORY Pt still intubated and on vent for respiratory failure but is weaning this morning.Marland Kitchen  He had failed spontaneous breath trial with tachypnea yesterday and was given Lasix with 2 L diuresis.  No family at the bedside.  More alert today.  Following commands well and moving all 4 extremities purposefully though with mild right hemiparesis nods head to voice,   He is off precedex this morning.  Vitals:   09/10/22 1000 09/10/22 1022 09/10/22 1100 09/10/22 1200  BP: (!) 123/51  (!) 117/51 (!) 124/46  Pulse: 71  71 72  Resp: (!) '24  17 20  '$ Temp:      TempSrc:      SpO2: 100% 100% 100% 100%  Weight:      Height:       CBC:  Recent Labs  Lab 09/01/2022 1520 09/15/2022 1547 09/06/22 0926 09/07/22 0232 09/09/22 0454 09/10/22 0327  WBC 3.8*  --  10.8*   < > 5.4 6.6  NEUTROABS 2.9  --  9.8*  --   --   --   HGB 11.1*   < > 9.5*   < > 10.2* 10.4*  HCT 36.7*   < > 30.3*   < > 33.1* 34.7*  MCV 88.2  --  85.8   < > 87.1 88.1  PLT 149*  --  140*   < > 136* 142*   < > = values in this interval not displayed.   Basic Metabolic Panel:  Recent Labs  Lab 09/08/22 0551 09/08/22 1700 09/09/22 0454 09/10/22 0327  NA 141  --  142 144  K 3.6  --  3.6 3.8  CL 111  --  108 107  CO2 23  --  26 27  GLUCOSE 170*  --  167* 171*  BUN 15  --  17 20  CREATININE 0.87  --  0.84 0.85  CALCIUM 8.2*  --  8.1* 8.4*  MG 2.0 2.0  --   --   PHOS 2.3* 2.1* 2.5  --    Lipid Panel:  Recent Labs  Lab 09/06/22 0211 09/07/22 0232  CHOL 129  --   TRIG 75 81  HDL 58  --   CHOLHDL 2.2  --   VLDL 15  --   LDLCALC 56  --    HgbA1c:  Recent Labs  Lab 09/06/22 0211  HGBA1C 6.5*   Urine Drug Screen: No results for input(s): "LABOPIA", "COCAINSCRNUR", "LABBENZ", "AMPHETMU", "THCU", "LABBARB" in the last 168 hours.  Alcohol Level No results for input(s): "ETH" in the last 168 hours.  IMAGING past 24 hours No results found.  PHYSICAL EXAM  General - Well  nourished, well developed elderly African-American male intubated off sedation.   Ophthalmologic - fundi not visualized due to noncooperation.   Cardiovascular - irregularly irregular heart rate and rhythm.   Neuro - intubated off sedation, eyes open on voice, following simple commands. Eyes in more left gaze preference position, slowly tracking to right but not complete right gaze, pupils 12m and reactive to light. Corneal reflex present, gag and cough present. Breathing over the vent.   Facial symmetry not able to test due to ET tube.  Tongue protrusion not cooperative. Follows simple commands and against gravity BUEs left greater than right, wiggle toes on b/l feet. DTR 1+ and no babinski. Sensation, coordination not cooperative and gait not tested.     ASSESSMENT/PLAN Mr. Vincent FERRISis a 87y.o. male  with history of  alcoholic cardiomyopathy, permanent atrial fibrillation on Eliquis, CHF with an EF 25 to 30% with global hypokinesis, CAD s/p stenting, DM2, prior stroke, chronic bilateral lower extremity wounds with edema, LLE peroneal neuropathy with chronic foot drop, COPD, HLD and HTN who presented to the ED via EMS this afternoon after he was found down.  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  CTA head & neck Occlusion of the left V4 segment and poor opacification of the basilar artery with apparent occlusion distally, age indeterminate. The bilateral PCAs are supplied by posterior communicating arteries with likely retrograde flow into the P1 segments.Multifocal high-grade stenosis of the right M2 and M3 branches   S/p IR with TICI2c reperfusion MRI - Multiple areas of acute infarct involving the cerebellum bilaterally, left pons, and right frontal white matter. Chronic infarcts in the cerebellum  bilaterally with evidence of prior hemorrhage. Large area of chronic infarct and mild chronic hemorrhage and in the right MCA territory.  MRA  - Basilar artery is patent now.   1/11- CT repeat - no hydrocephalus  1/12- CT repeat - no hydrocephalus 2D Echo EF 20-25%  LDL 56 HgbA1c 6.5 VTE prophylaxis - Heparin subq Eliquis (apixaban) daily prior to admission, now on ASA. Plan to resume AC with heparin IV 09/10/2022 Therapy recommendations:  CIR Disposition:  Pending  Chronic Atrial Fibrillation  Home med: Eliquis Rate controlled now on ASA for now. Plan to resume AC with heparin IV over the weekend.  Congestive heart failure Alcoholic cardiomyopathy 03/1447 EF 25 to 30%.  This admission EF 20 to 25%. Home meds including Imdur, digoxin, Jardiance, lasix On digoxin CCM managing Pt follows with Dr. Aundra Dubin as outpt  Hypertension Home meds:  Lasix, coreg, aldactone Stable BP goal < 180/105 Long-term BP goal normotensive  Post operative respiratory insufficiency  Remains intubated post procedure CCM to manage ventilator settings Hx of COPD Extubate as able  Hyperlipidemia Home meds:  Atorvastatin '80mg'$   LDL 56, goal < 70 AST/ALT 64/48-> 33/30 Resumed lipitor 80 Continue statin at discharge  Diabetes type II Controlled Home meds:  Jardiance, metformin HgbA1c 6.5, goal < 7.0 CBGs SSI  Other Stroke Risk Factors Advanced Age >/= 2  Hx stroke/TIA Evidence of chronic infarcts on imaging Coronary artery disease status post stenting patient  Other Active Problems Chronic BLE wound Chronic b/l foot drop Recent left hip fracture - walking with walker at home Leukopenia WBC 3.8->7.3->5.6 -> 3.8->5.4 Hypokalemia K 3.2 -> 3.7->3.6  Patient neurological exam appears improved today with patient being interactive and following commands and moving all 4 extremities though with mild right hemiparesis.  Plan to resume anticoagulation with IV heparin till patient is extubated and  able to swallow safely then we will switch back to Eliquis.  Wean off ventilatory support as per critical care team.  No family available at the bedside for discussion.  Discussed with Dr. Lynetta Mare critical care medicine.This patient is critically ill and at significant risk of neurological worsening, death and care requires constant monitoring of vital signs, hemodynamics,respiratory and cardiac monitoring, extensive review of multiple databases, frequent neurological assessment, discussion with family, other specialists and medical decision making of high complexity.I have made any additions or clarifications directly to the above note.This critical care time does not reflect procedure time, or  teaching time or supervisory time of PA/NP/Med Resident etc but could involve care discussion time.  I spent 30 minutes of neurocritical care time  in the care of  this patient.       09/10/2022 12:50 PM     To contact Stroke Continuity provider, please refer to [email or web address] After hours, contact General Neurology

## 2022-09-10 NOTE — Progress Notes (Addendum)
NAME:  Vincent [name], MRN:  [id number], DOB:  Feb 14, 1936, LOS: 5 ADMISSION DATE:  09/26/2022, CONSULTATION DATE:  09/06/2022 REFERRING MD:  Marjory Lies - NIR CHIEF COMPLAINT:  S/p basilar artery occlusion, vent Management   History of Present Illness:  87 year old man who presented to [facility] ED 1/8 as a Code Stroke after being found down at home. Presented with R gaze preference, L-sided deficits and dysarthria. PMHx significant for HTN, HLD, AF (on Eliquis), CHF (EF 25-30%), CAD s/p stent (2004), prior CVA with residual L-sided deficits, COPD, T2DM.  CTA Head/Neck demonstrated distal basilar artery occlusion, left V4 occlusion. He was seen by neurology and IR and was taken to IR for revascularization of distal basilar artery; TICI 2C revascularization achieved. Post-procedure CT negative for hemorrhage.  Post-procedure, he was left intubated and was transferred to ICU.  PCCM was asked to see for vent management.  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 s/p remote stent to Cx 2004 with chronic stable angina in context of residual circumflex disease.   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 ([place]) 09/25/2015   Hyperlipidemia    Hypertension    Long term (current) use of anticoagulants    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/25/2015   NSVT (nonsustained ventricular tachycardia) (HCC)    Permanent atrial fibrillation (HCC)    Pulmonary eosinophilia (HCC)    Sinus bradycardia    Stroke [facility])    Syncope and collapse    Unspecified glaucoma(365.9)    Significant Hospital Events: Including procedures,  antibiotic start and stop dates in addition to other pertinent events   1/9 Admit, to VIR for revascularization of occluded basilar artery. 1/11 agitated with vent wean 1/12 less interactive. CT head with no acute findings. Failed SBT with tachypnea. Given lasix with 2L out   Interim History / Subjective:  This AM off sedation. Undergoing SBT with PS 8/5   Objective:  Blood pressure 128/65, pulse (!) 55, temperature 100.3 F (37.9 C), temperature source Oral, resp. rate 18, height '5\' 9"'$  (1.753 m), weight 75.7 kg, SpO2 100 %.    Vent Mode: PRVC FiO2 (%):  [30 %-40 %] 40 % Set Rate:  [15 bmp] 15 bmp Vt Set:  [560 mL] 560 mL PEEP:  [5 cmH20] 5 cmH20 Plateau Pressure:  [14 cmH20-16 cmH20] 14 cmH20   Intake/Output Summary (Last 24 hours) at 09/10/2022 0806 Last data filed at 09/10/2022 0700 Gross per 24 hour  Intake 1282.57 ml  Output 2450 ml  Net -1167.43 ml   Filed Weights   09/06/22 0200 09/06/22 0432  Weight: 84.2 kg 75.7 kg    General:  Ill appearing elderly male, intubated  HEENT: ETT/OG in place  Neuro: Opens eyes with verbal stimulation, follows commands throughout  CV: Irregular, HR 86, no mRG  PULM:  Coarse breaths sounds, mild tachypnea  GI: soft, bsx4 active  GU: clear urine, external foley in place  Extremities: warm/dry, +1 edema in BLE Skin: no rashes or lesions   Assessment & Plan:   Basilar artery occlusion Acute Encephalopathy  -  s/p IR revascularization. CTA Head/Neck demonstrated distal basilar artery occlusion, left V4 occlusion. Taken to IR for revascularization of distal basilar artery; TICI 2C revascularization. Post-procedure CT negative for hemorrhage. Plan - Stroke team primary - Continues on ASA 325  - Goal SBP <180  - Precedex off, titrate for RASS goal 0/-1  - Neuroprotective measures: HOB > 30 degrees, normoglycemia, normothermia, electrolytes WNL  Respiratory insufficiency  - due to inability to protect the airway in the setting of  above. - CXR 1/11 with cardiomegaly, mild pulmonary vascular congestion  Hx COPD Hemoptysis Plan -Maintain full vent support with SAT/SBT as tolerated (Failed 1/12 with tachypnea). Goal extubate in next 24 hours  -titrate Vent setting to maintain SpO2 greater than or equal to 90%. -HOB elevated 30 degrees. -Plateau pressures less than 30 cm H20.  -Follow chest x-ray, ABG prn.   -Bronchial hygiene and RT/bronchodilator protocol. -continue Lasix '60mg'$  qd   Hx A.fib on Eliquis, sCHF (echo from Aug 2023 with EF 25%-30%) - Echo with EF 20-25% similar to prior CAD s/p stenting HTN HLD HFrEF - continue holding heparin gtt, resume per neuro - BP management as above - Continue statin, resume digoxin as appropriate - Hold Imdur, Coreg; resume as clinically appropriate - Re-start home spironolactone and Entresto   Hx DM Hemoglobin A1C 6.5% - SSI - CBGs Q4H - Goal CBG 140-180 - Hold home medications  Hematuria -pt pulled at foley 1/10, perhaps secondary to trauma >> no further noted, external cath in place   Best practice (evaluated daily):  Diet/type: TF DVT prophylaxis: SCD GI prophylaxis: PPI Lines: Arterial Line Foley:  Yes, and it is still needed Code Status:  full code Last date of multidisciplinary goals of care discussion: Per primary,   Critical care time: 35 minutes   CRITICAL CARE Performed by: Omar Person   Total critical care time: 35 minutes  Critical care time was exclusive of separately billable procedures and treating other patients.  Critical care was necessary to treat or prevent imminent or life-threatening deterioration.  Critical care was time spent personally by me on the following activities: development of treatment plan with patient and/or surrogate as well as nursing, discussions with consultants, evaluation of patient's response to treatment, examination of patient, obtaining history from patient or surrogate, ordering and performing  treatments and interventions, ordering and review of laboratory studies, ordering and review of radiographic studies, pulse oximetry and re-evaluation of patient's condition.  Hayden Pedro, AGACNP-BC [facility]  PCCM Pgr: [phone]   Please see [email or web address] for pager details.  From 7A-7P if no response, please call [phone] After hours, please call ELink [phone]

## 2022-09-10 NOTE — Progress Notes (Signed)
OT Cancellation Note  Patient Details Name: Vincent [name] MRN: [id number] DOB: 10/05/1935   Cancelled Treatment:    Reason Eval/Treat Not Completed: Medical issues which prohibited therapy.  RN requesting hold per PT.  OT to continue efforts as appropriate.  Attempting to wean from vent.    Bleu Moisan D Melody Cirrincione 09/10/2022, 10:04 AM 09/10/2022  RP, OTR/L  Acute Rehabilitation Services  Office:  [phone]

## 2022-09-10 NOTE — Progress Notes (Signed)
ANTICOAGULATION CONSULT NOTE - Initial Consult  Pharmacy Consult:  Heparin Indication: atrial fibrillation  No Known Allergies  Patient Measurements: Height: '5\' 9"'$  (175.3 cm) Weight: 75.7 kg (166 lb 14.2 oz) IBW/kg (Calculated) : 70.7 Heparin Dosing Weight: 76 kg  Vital Signs: Temp: 99.3 F (37.4 C) (01/13 1200) Temp Source: Axillary (01/13 1200) BP: 111/47 (01/13 1400) Pulse Rate: 53 (01/13 1400)  Labs: Recent Labs    09/08/22 0551 09/08/22 1129 09/09/22 0454 09/10/22 0327  HGB 9.9* 8.8* 10.2* 10.4*  HCT 33.3* 28.3* 33.1* 34.7*  PLT 147* 145* 136* 142*  APTT  --  34  --   --   LABPROT  --  14.9  --   --   INR  --  1.2  --   --   CREATININE 0.87  --  0.84 0.85    Estimated Creatinine Clearance: 64.7 mL/min (by C-G formula based on SCr of 0.85 mg/dL).   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 s/p remote stent to Cx 2004 with chronic stable angina in context of residual circumflex disease.   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  Patient admitted with MCA CVA s/p thrombectomy on 1/9.  He was started on IV heparin, which was then  held due to large size of stroke.  Now stable and Pharmacy consulted to restart IV heparin until patient could take PO Eliquis.  CBC stable; no bleeding reported.  Goal of Therapy:  Heparin level 0.3-0.5 units/ml Monitor platelets by anticoagulation protocol: Yes   Plan:  No bolus with CVA Initiate IV heparin at 800 units/hr Check 8 hr heparin level and PTT Daily heparin level and CBC - order daily if needed  Heli Dino D. Mina Marble, PharmD, BCPS, [place] 09/10/2022, 2:57 PM

## 2022-09-11 ENCOUNTER — Inpatient Hospital Stay (HOSPITAL_COMMUNITY): Payer: Medicare Other

## 2022-09-11 DIAGNOSIS — Z9889 Other specified postprocedural states: Secondary | ICD-10-CM | POA: Diagnosis not present

## 2022-09-11 DIAGNOSIS — R7881 Bacteremia: Secondary | ICD-10-CM | POA: Diagnosis not present

## 2022-09-11 DIAGNOSIS — B9561 Methicillin susceptible Staphylococcus aureus infection as the cause of diseases classified elsewhere: Secondary | ICD-10-CM

## 2022-09-11 DIAGNOSIS — I639 Cerebral infarction, unspecified: Secondary | ICD-10-CM | POA: Diagnosis not present

## 2022-09-11 DIAGNOSIS — R509 Fever, unspecified: Secondary | ICD-10-CM | POA: Diagnosis not present

## 2022-09-11 LAB — URINALYSIS, ROUTINE W REFLEX MICROSCOPIC
Bilirubin Urine: NEGATIVE
Glucose, UA: 50 mg/dL — AB
Ketones, ur: NEGATIVE mg/dL
Leukocytes,Ua: NEGATIVE
Nitrite: NEGATIVE
Protein, ur: 30 mg/dL — AB
RBC / HPF: >50 RBC/hpf — ABNORMAL HIGH (ref 0–5)
Specific Gravity, Urine: 1.01 (ref 1.005–1.030)
pH: 5 (ref 5.0–8.0)

## 2022-09-11 LAB — RESPIRATORY PANEL BY PCR

## 2022-09-11 LAB — GLUCOSE, CAPILLARY
Glucose-Capillary: 141 mg/dL — ABNORMAL HIGH (ref 70–99)
Glucose-Capillary: 180 mg/dL — ABNORMAL HIGH (ref 70–99)
Glucose-Capillary: 180 mg/dL — ABNORMAL HIGH (ref 70–99)
Glucose-Capillary: 189 mg/dL — ABNORMAL HIGH (ref 70–99)
Glucose-Capillary: 201 mg/dL — ABNORMAL HIGH (ref 70–99)
Glucose-Capillary: 204 mg/dL — ABNORMAL HIGH (ref 70–99)
Glucose-Capillary: 220 mg/dL — ABNORMAL HIGH (ref 70–99)

## 2022-09-11 LAB — BASIC METABOLIC PANEL
Anion gap: 8 (ref 5–15)
BUN: 24 mg/dL — ABNORMAL HIGH (ref 8–23)
CO2: 29 mmol/L (ref 22–32)
Calcium: 8 mg/dL — ABNORMAL LOW (ref 8.9–10.3)
Chloride: 107 mmol/L (ref 98–111)
Creatinine, Ser: 0.96 mg/dL (ref 0.61–1.24)
GFR, Estimated: >60 mL/min (ref >60)
Glucose, Bld: 214 mg/dL — ABNORMAL HIGH (ref 70–99)
Potassium: 3.6 mmol/L (ref 3.5–5.1)
Sodium: 144 mmol/L (ref 135–145)

## 2022-09-11 LAB — PROCALCITONIN: Procalcitonin: 1.33 ng/mL

## 2022-09-11 LAB — BLOOD CULTURE ID PANEL (REFLEXED) - BCID2

## 2022-09-11 LAB — CBC
HCT: 32.6 % — ABNORMAL LOW (ref 39.0–52.0)
Hemoglobin: 9.7 g/dL — ABNORMAL LOW (ref 13.0–17.0)
MCH: 26.2 pg (ref 26.0–34.0)
MCHC: 29.8 g/dL — ABNORMAL LOW (ref 30.0–36.0)
MCV: 88.1 fL (ref 80.0–100.0)
Platelets: 122 10*3/uL — ABNORMAL LOW (ref 150–400)
RBC: 3.7 MIL/uL — ABNORMAL LOW (ref 4.22–5.81)
RDW: 18.3 % — ABNORMAL HIGH (ref 11.5–15.5)
WBC: 8.9 10*3/uL (ref 4.0–10.5)
nRBC: 0 % (ref 0.0–0.2)

## 2022-09-11 LAB — HEPARIN LEVEL (UNFRACTIONATED)
Heparin Unfractionated: 0.21 IU/mL — ABNORMAL LOW (ref 0.30–0.70)
Heparin Unfractionated: 0.48 IU/mL (ref 0.30–0.70)

## 2022-09-11 LAB — STREP PNEUMONIAE URINARY ANTIGEN: Strep Pneumo Urinary Antigen: NEGATIVE

## 2022-09-11 LAB — APTT: aPTT: 60 seconds — ABNORMAL HIGH (ref 24–36)

## 2022-09-11 LAB — PHOSPHORUS: Phosphorus: 2.1 mg/dL — ABNORMAL LOW (ref 2.5–4.6)

## 2022-09-11 LAB — MAGNESIUM: Magnesium: 2 mg/dL (ref 1.7–2.4)

## 2022-09-11 MED ORDER — SODIUM CHLORIDE 0.9 % IV SOLN
12.5000 mg | Freq: Three times a day (TID) | INTRAVENOUS | Status: DC | PRN
  Administered 2022-09-11: 12.5 mg via INTRAVENOUS
  Filled 2022-09-11: qty 0.5

## 2022-09-11 MED ORDER — INSULIN ASPART 100 UNIT/ML IJ SOLN
2.0000 [IU] | INTRAMUSCULAR | Status: DC
  Administered 2022-09-11 – 2022-09-12 (×6): 2 [IU] via SUBCUTANEOUS

## 2022-09-11 MED ORDER — CARVEDILOL 3.125 MG PO TABS
3.1250 mg | ORAL_TABLET | Freq: Two times a day (BID) | ORAL | Status: DC
  Administered 2022-09-11 – 2022-09-19 (×13): 3.125 mg
  Filled 2022-09-11 (×13): qty 1

## 2022-09-11 MED ORDER — POTASSIUM PHOSPHATES 15 MMOLE/5ML IV SOLN
30.0000 mmol | Freq: Once | INTRAVENOUS | Status: AC
  Administered 2022-09-11: 30 mmol via INTRAVENOUS
  Filled 2022-09-11: qty 10

## 2022-09-11 MED ORDER — CEFAZOLIN SODIUM-DEXTROSE 2-4 GM/100ML-% IV SOLN
2.0000 g | Freq: Three times a day (TID) | INTRAVENOUS | Status: DC
  Administered 2022-09-11 – 2022-09-13 (×6): 2 g via INTRAVENOUS
  Filled 2022-09-11 (×6): qty 100

## 2022-09-11 MED ORDER — IOHEXOL 350 MG/ML SOLN
75.0000 mL | Freq: Once | INTRAVENOUS | Status: AC | PRN
  Administered 2022-09-11: 75 mL via INTRAVENOUS

## 2022-09-11 NOTE — Progress Notes (Signed)
Transported to and from 4N28 to CT2 on vent. Pt very agitated throughout. VS WNL

## 2022-09-11 NOTE — Progress Notes (Addendum)
STROKE TEAM PROGRESS NOTE   INTERVAL HISTORY Febrile 103F, repeat head ct later today. Thorazine for hiccups. Low threshold for abx- management per CCM and ID Urine culture and blood cultures sent Pt still intubated and on vent for respiratory failure, becomes tachypneic during weans.  No family at the bedside.  Following commands well and moving all 4 extremities purposefully though with mild right hemiparesis nods head to voice,   He is off precedex this morning.  Heparin IV started yesterday.  Vital signs stable.  He is still requiring ventilatory support for respiratory failure as attempt to wean have led to tachycardia and respiratory distress  Vitals:   09/11/22 0400 09/11/22 0500 09/11/22 0600 09/11/22 0700  BP: (!) 139/49 (!) 150/108 (!) 131/57 (!) 155/54  Pulse: (!) 53  78 60  Resp: 18 (!) 35 14 14  Temp:      TempSrc:      SpO2: 100%  100% 100%  Weight:      Height:       CBC:  Recent Labs  Lab 09/08/2022 1520 08/30/2022 1547 09/06/22 0926 09/07/22 0232 09/10/22 0327 09/10/22 1455 09/11/22 0352  WBC 3.8*  --  10.8*   < > 6.6  --  8.9  NEUTROABS 2.9  --  9.8*  --   --   --   --   HGB 11.1*   < > 9.5*   < > 10.4* 11.6* 9.7*  HCT 36.7*   < > 30.3*   < > 34.7* 34.0* 32.6*  MCV 88.2  --  85.8   < > 88.1  --  88.1  PLT 149*  --  140*   < > 142*  --  122*   < > = values in this interval not displayed.    Basic Metabolic Panel:  Recent Labs  Lab 09/08/22 1700 09/09/22 0454 09/10/22 0327 09/10/22 1455 09/11/22 0352  NA  --  142 144 146* 144  K  --  3.6 3.8 4.3 3.6  CL  --  108 107  --  107  CO2  --  26 27  --  29  GLUCOSE  --  167* 171*  --  214*  BUN  --  17 20  --  24*  CREATININE  --  0.84 0.85  --  0.96  CALCIUM  --  8.1* 8.4*  --  8.0*  MG 2.0  --   --   --  2.0  PHOS 2.1* 2.5  --   --  2.1*    Lipid Panel:  Recent Labs  Lab 09/06/22 0211 09/07/22 0232  CHOL 129  --   TRIG 75 81  HDL 58  --   CHOLHDL 2.2  --   VLDL 15  --   LDLCALC 56  --      HgbA1c:  Recent Labs  Lab 09/06/22 0211  HGBA1C 6.5*    Urine Drug Screen: No results for input(s): "LABOPIA", "COCAINSCRNUR", "LABBENZ", "AMPHETMU", "THCU", "LABBARB" in the last 168 hours.  Alcohol Level No results for input(s): "ETH" in the last 168 hours.  IMAGING past 24 hours DG CHEST PORT 1 VIEW  Result Date: 09/10/2022 CLINICAL DATA:  Ventilator dependent EXAM: PORTABLE CHEST 1 VIEW COMPARISON:  Prior chest x-ray 08/29/2022 FINDINGS: Patient remains intubated. The tip of the endotracheal tube is 7.7 cm above the carina. A feeding tube is present. The tip lies off the field of view, presumably in the stomach. Significant enlargement of the cardiopericardial silhouette  as seen before. Improved aeration. Lungs are clear. Perhaps trace small pleural effusions. No pulmonary edema. No pneumothorax. No acute osseous abnormality. IMPRESSION: 1. Overall improved aeration and inspiratory volumes. 2. Stable and satisfactory support apparatus. 3. Persistent cardiomegaly. 4. Probable trace bilateral pleural effusions. Electronically Signed   By: Jacqulynn Cadet M.D.   On: 09/10/2022 15:08    PHYSICAL EXAM  General - Well nourished, well developed elderly African-American male intubated off sedation.   Ophthalmologic - fundi not visualized due to noncooperation.   Cardiovascular - irregularly irregular heart rate and rhythm.   Neuro - intubated off sedation, eyes open on voice, following simple commands. Eyes in more left gaze preference position, slowly tracking to right but not complete right gaze, pupils 53m and reactive to light. Corneal reflex present, gag and cough present. Breathing over the vent.   Facial symmetry not able to test due to ET tube.  Tongue protrusion not cooperative. Follows simple commands and against gravity BUEs left greater than right, wiggle toes on b/l feet. DTR 1+ and no babinski. Sensation, coordination not cooperative and gait not  tested.     ASSESSMENT/PLAN Mr. Vincent CATANZAROis a 87y.o. male with history of  alcoholic cardiomyopathy, permanent atrial fibrillation on Eliquis, CHF with an EF 25 to 30% with global hypokinesis, CAD s/p stenting, DM2, prior stroke, chronic bilateral lower extremity wounds with edema, LLE peroneal neuropathy with chronic foot drop, COPD, HLD and HTN who presented to the ED via EMS this afternoon after he was found down.  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  CTA head & neck Occlusion of the left V4 segment and poor opacification of the basilar artery with apparent occlusion distally, age indeterminate. The bilateral PCAs are supplied by posterior communicating arteries with likely retrograde flow into the P1 segments.Multifocal high-grade stenosis of the right M2 and M3 branches   S/p IR with TICI2c reperfusion MRI - Multiple areas of acute infarct involving the cerebellum bilaterally, left pons, and right frontal white matter. 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 Large area of chronic infarct and mild chronic hemorrhage and in the right MCA territory.  MRA  - Basilar artery is patent now.   1/11- CT repeat - no hydrocephalus  1/12- CT repeat - no hydrocephalus 2D Echo EF 20-25%  LDL 56 HgbA1c 6.5 VTE prophylaxis - Heparin subq Eliquis (apixaban) daily prior to admission,   resumed AC with heparin IV 09/10/2022 Therapy recommendations:  CIR Disposition:  Pending  Chronic Atrial Fibrillation  Home med: Eliquis Rate controlled Heparin IV  Congestive heart failure Alcoholic cardiomyopathy 82/3300EF 25 to 30%.  This admission EF 20 to 25%. Home meds including Imdur, digoxin, Jardiance, lasix On digoxin CCM managing Pt follows with Dr. MAundra Dubinas  outpt  Hypertension Home meds:  Lasix, coreg, aldactone Stable BP goal < 180/105 Long-term BP goal normotensive  Post operative respiratory insufficiency  Remains intubated post procedure CCM to manage ventilator settings Hx of COPD Extubate as able  Hyperlipidemia Home meds:  Atorvastatin '80mg'$   LDL 56, goal < 70 AST/ALT 64/48-> 33/30 Resumed lipitor 80 Continue statin at discharge  Diabetes type II Controlled Home meds:  Jardiance, metformin HgbA1c 6.5, goal < 7.0 CBGs SSI  Other Stroke Risk Factors Advanced Age >/=  44  Hx stroke/TIA Evidence of chronic infarcts on imaging Coronary artery disease status post stenting patient  Other Active Problems Chronic BLE wound Chronic b/l foot drop Recent left hip fracture - walking with walker at home Leukopenia WBC 3.8->7.3->5.6 -> 3.8->5.4 Hypokalemia K 3.2 -> 3.7->3.6 Febrile-Tmax 103F ID consulted Blood cultures respiratory culture and urine culture sent  Patient seen and examined by NP/APP with MD. MD to update note as needed.   Janine Ores, DNP, FNP-BC [facility] Pager: [phone]  I have personally obtained history,examined this patient, reviewed notes, independently viewed imaging studies, participated in medical decision making and plan of care.ROS completed by me personally and pertinent positives fully documented  I have made any additions or clarifications directly to the above note. Agree with note above.  Patient continues to require ventilatory support for respiratory failure and attempts to wean him up so far not been successful.  Continue IV heparin for his A-fib for stroke prevention.  Try Thorazine 12.5 mg as needed for hiccups.  Workup for fever and treatment as per pulmonary critical care team.  Long discussion with patient's sister at the bedside and with critical care team nurse practitioner and answered questions.This patient is critically ill and at significant risk of neurological  worsening, death and care requires constant monitoring of vital signs, hemodynamics,respiratory and cardiac monitoring, extensive review of multiple databases, frequent neurological assessment, discussion with family, other specialists and medical decision making of high complexity.I have made any additions or clarifications directly to the above note.This critical care time does not reflect procedure time, or teaching time or supervisory time of PA/NP/Med Resident etc but could involve care discussion time.  I spent 30 minutes of neurocritical care time  in the care of  this patient.      Antony Contras, MD Medical Director [facility] Pager: [phone] 09/11/2022 3:41 PM   To contact Stroke Continuity provider, please refer to [email or web address] After hours, contact General Neurology

## 2022-09-11 NOTE — Progress Notes (Signed)
Notified elink Pt is in Afib RVR with HR 160-200s bp 117/102. Pt responding to voice and follows commands. Currently awaiting new orders.   Donalda Ewings RN

## 2022-09-11 NOTE — Progress Notes (Signed)
ANTICOAGULATION CONSULT NOTE  Pharmacy Consult:  Heparin Indication: atrial fibrillation  No Known Allergies  Patient Measurements: Height: '5\' 9"'$  (175.3 cm) Weight: 75.7 kg (166 lb 14.2 oz) IBW/kg (Calculated) : 70.7 Heparin Dosing Weight: 76 kg  Vital Signs: Temp: 103.1 F (39.5 C) (01/14 0800) Temp Source: Axillary (01/14 0800) BP: 129/57 (01/14 1144) Pulse Rate: 74 (01/14 1144)  Labs: Recent Labs    09/09/22 0454 09/10/22 0327 09/10/22 1455 09/10/22 2340 09/11/22 0352 09/11/22 1027  HGB 10.2* 10.4* 11.6*  --  9.7*  --   HCT 33.1* 34.7* 34.0*  --  32.6*  --   PLT 136* 142*  --   --  122*  --   APTT  --   --   --  60*  --   --   HEPARINUNFRC  --   --   --  0.21*  --  0.48  CREATININE 0.84 0.85  --   --  0.96  --      Estimated Creatinine Clearance: 57.3 mL/min (by C-G formula based on SCr of 0.96 mg/dL).   Assessment: 79 YOM with history of Afib on Eliquis PTA, last down unknown, prior to 1/8.  Patient admitted with MCA CVA s/p thrombectomy on 1/9.  He was started on IV heparin, which was then held due to large size of stroke.  Now stable and Pharmacy consulted to restart IV heparin until patient could take PO Eliquis.    Heparin level therapeutic and toward the high end of normal.  Urine is dark, but improving per MD.  No other issues per discussion with RN.  Goal of Therapy:  Heparin level 0.3-0.5 units/ml Monitor platelets by anticoagulation protocol: Yes   Plan:  No bolus with CVA Reduce IV heparin slightly to 850 units/hr Daily heparin level and CBC  Dayona Shaheen D. Mina Marble, PharmD, BCPS, [place] 09/11/2022, 12:25 PM

## 2022-09-11 NOTE — Progress Notes (Signed)
BLE venous duplex has been completed.   Results can be found under chart review under CV PROC. 09/11/2022 1:19 PM Alaine Loughney RVT, RDMS

## 2022-09-11 NOTE — Care Management (Signed)
This RN noticed patient had purulent drainage from a previous PIV site in Left mid/upper extremity. This RN reached out to MD Vu with infectious disease. Orders placed.   Daisey Must, RN

## 2022-09-11 NOTE — Consult Note (Signed)
[facility]    Date of Admission:  09/12/2022     Reason for Consult: mssa bacteremia    Referring Provider: Charise Carwin     Lines:  Periheral iv's  Abx: 1/14-c cefazolin          Assessment: Ischemic basilar stroke HOB with mssa Sepsis Hypoxemic respiratory failure History left hip orif Presence of internal loop recorder  87 yo male pmh alcoholic cardiomyopathy, afib on eliquis, HFrEF (25%/global hypokinesis), s/p cardiac device, CAD with prior ptca/stent, dm2, hx cva, chronic venous stasis, LLE peroneal neuropathy/foot drop, copd, admitted 1/08 after found down by family members of unclear duration, hx left hip orif distant past, arrived on stroke team, course complicated by hospital onset mssa bacteremia.   Patient arrived intubated for airway protection Stroke w/u showed distal basilar artery occlusion, left v4 occlusion. He is s/p IR revascularization 1/09 of distal basilar artery. Post procedure 1/12 ct head negative for hemorrhage  There was no admission day blood cx as patient didn't have sign of sepsis. However on HD #5 developed fever and sepsis w/u showed mssa bsi. He was started on cefazolin within 24 hours bcx result. 1/13 cxr no opacity; trace pleural effusion  He has had no central line as of the time of bacteremia/sepsis, only peripheral iv's which could be source but unclear  1/09 mrsa nares negative 1/11 respiratory cx normal flora no pseudomonas or mrsa 1/13 bcx mssa 1/13 respiratory cx in process   Today 1/14 remains intubated due to failure to wean, in setting new sepsis  His loop recorder likely not invovled, but would let EP team know  He does have left hip orif and primary team planning on imaging to see if its the source   Currently has no focal pain  Plan: Repeat blood cx today Remove/exchange all peripheral iv Continue cefazolin Duration abx pending repeat blood cx He has recent echo a few days  prior, and given well timed onset sepsis/bacteremia I do not see need to repeat an echo at this time Will assess for metastatic foci of infection when he is extubated Given presence of loop recorder, although likely uninvolved, would also consult EP Primary team planning left hip ct Discussed with primary team   I spent 75 minute reviewing data/chart, and coordinating care and >50% direct face to face time providing counseling/discussing diagnostics/treatment plan with patient       ------------------------------------------------ Principal Problem:   Status post surgery Active Problems:   Stroke (cerebrum) ([place])   Basilar artery occlusion   Chronic respiratory failure with hypoxia ([place])   Basilar artery obstruction   Malnutrition of moderate degree   Encephalopathy acute   Ventilator dependence (HCC)    HPI: Vincent [name] is a 87 y.o. male pmh alcoholic cardiomyopathy, afib on eliquis, HFrEF (25%/global hypokinesis), s/p cardiac device, CAD with prior ptca/stent, dm2, hx cva, chronic venous stasis, LLE peroneal neuropathy/foot drop, copd, admitted 1/08 after found down by family members of unclear duration, hx left hip orif distant past, arrived on stroke team, course complicated by hospital onset mssa bacteremia.   Patient initially admitted 1/9 on stroke protocol for distal basilar artery occlusion. Ir had performed revascularization. He was intubated prior to ir procedure and had remained so for aiway protection  His course was complicated by well timed onset sepsis 1/13 (hd #5) and bcx returned with mssa  There has been no central line. He does have an old left hip  prosthesis and internal loop recorder. He currently denies any focal pain  Hx limited though due to intubation status  No rash, complaint reported by nursing or his sister who is present at bedside  Family History  Problem Relation Age of Onset   Colon cancer Mother    Hypertension Mother    Cancer Mother     Prostate cancer Brother    Cancer Brother    Heart disease Father    Heart attack Father    Hypertension Father    Heart attack Sister    Cancer Sister    Stroke Neg Hx    Colon polyps Neg Hx    Esophageal cancer Neg Hx    Stomach cancer Neg Hx    Rectal cancer Neg Hx    Pancreatic cancer Neg Hx     Social History   Tobacco Use   Smoking status: Former    Packs/day: 0.50    Years: 15.00    Total pack years: 7.50    Types: Cigarettes    Quit date: 08/22/1983    Years since quitting: 39.0   Smokeless tobacco: Never  Vaping Use   Vaping Use: Never used  Substance Use Topics   Alcohol use: No   Drug use: No    No Known Allergies  Review of Systems: ROS All Other ROS was negative, except mentioned above   Past Medical History:  Diagnosis Date   Bradycardia    Chronic systolic CHF (congestive heart failure) (HCC)    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 s/p remote stent to Cx 2004 with chronic stable angina in context of residual circumflex disease.   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 ([place]) 09/25/2015   Hyperlipidemia    Hypertension    Long term (current) use of anticoagulants    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/25/2015   NSVT (nonsustained ventricular tachycardia) (HCC)    Permanent atrial fibrillation (HCC)    Pulmonary eosinophilia (HCC)    Sinus bradycardia    Stroke (HCC)    Syncope and collapse    Unspecified glaucoma(365.9)        Scheduled Meds:  aspirin  325 mg Per Tube Daily   atorvastatin  80 mg Per Tube Daily   brimonidine  1 drop Both Eyes BID   carvedilol  3.125 mg Per Tube BID   Chlorhexidine Gluconate Cloth  6 each Topical Q0600   docusate  100 mg Per Tube BID    feeding supplement (PROSource TF20)  60 mL Per Tube BID   furosemide  40 mg Intravenous Daily   insulin aspart  0-15 Units Subcutaneous Q4H   insulin aspart  2 Units Subcutaneous Q4H   mouth rinse  15 mL Mouth Rinse Q2H   pantoprazole (PROTONIX) IV  40 mg Intravenous Q24H   polyethylene glycol  17 g Per Tube Daily   sacubitril-valsartan  1 tablet Per Tube BID   spironolactone  25 mg Per Tube Daily   Continuous Infusions:   ceFAZolin (ANCEF) IV 2 g (09/11/22 1244)   chlorproMAZINE (THORAZINE) 12.5 mg in sodium chloride 0.9 % 25 mL IVPB     dexmedetomidine (PRECEDEX) IV infusion 0.2 mcg/kg/hr (09/11/22 1021)  feeding supplement (OSMOLITE 1.5 CAL) 50 mL/hr at 09/11/22 0700   heparin 850 Units/hr (09/11/22 1244)   potassium PHOSPHATE IVPB (in mmol) 30 mmol (09/11/22 1217)   PRN Meds:.acetaminophen **OR** acetaminophen (TYLENOL) oral liquid 160 mg/5 mL **OR** acetaminophen, chlorproMAZINE (THORAZINE) 12.5 mg in sodium chloride 0.9 % 25 mL IVPB, fentaNYL (SUBLIMAZE) injection, fentaNYL (SUBLIMAZE) injection, hydrALAZINE, senna-docusate   OBJECTIVE: Blood pressure (!) 125/54, pulse 70, temperature (!) 102 F (38.9 C), temperature source Oral, resp. rate 15, height '5\' 9"'$  (1.753 m), weight 75.7 kg, SpO2 100 %.  Physical Exam General/constitutional: intubated; eyes opened/tracking, nodding head on questions  HEENT: Normocephalic, PER, Conj Clear, EOMI, Oropharynx clear Neck supple CV: rrr no mrg; left chest nontender where the loop recorder is present Lungs: clear to auscultation, normal respiratory effort Abd: Soft, Nontender Ext: trace bilateral LE edema Skin: No Rash Neuro: nonfocal MSK: no peripheral joint swelling/tenderness/warmth; no left hip tenderness    Lab Results Lab Results  Component Value Date   WBC 8.9 09/11/2022   HGB 9.7 (L) 09/11/2022   HCT 32.6 (L) 09/11/2022   MCV 88.1 09/11/2022   PLT 122 (L) 09/11/2022    Lab Results  Component Value Date   CREATININE  0.96 09/11/2022   BUN 24 (H) 09/11/2022   NA 144 09/11/2022   K 3.6 09/11/2022   CL 107 09/11/2022   CO2 29 09/11/2022    Lab Results  Component Value Date   ALT 30 09/07/2022   AST 33 09/07/2022   ALKPHOS 110 09/07/2022   BILITOT 2.1 (H) 09/07/2022      Microbiology: Recent Results (from the past 240 hour(s))  Resp panel by RT-PCR (RSV, Flu A&B, Covid) Anterior Nasal Swab     Status: None   Collection Time: 09/03/2022  3:50 PM   Specimen: Anterior Nasal Swab  Result Value Ref Range Status   SARS Coronavirus 2 by RT PCR NEGATIVE NEGATIVE Final    Comment: (NOTE) SARS-CoV-2 target nucleic acids are NOT DETECTED.  The SARS-CoV-2 RNA is generally detectable in upper respiratory specimens during the acute phase of infection. The lowest concentration of SARS-CoV-2 viral copies this assay can detect is 138 copies/mL. A negative result does not preclude SARS-Cov-2 infection and should not be used as the sole basis for treatment or other patient management decisions. A negative result may occur with  improper specimen collection/handling, submission of specimen other than nasopharyngeal swab, presence of viral mutation(s) within the areas targeted by this assay, and inadequate number of viral copies(<138 copies/mL). A negative result must be combined with clinical observations, patient history, and epidemiological information. The expected result is Negative.  Fact Sheet for Patients:  EntrepreneurPulse.com.au  Fact Sheet for Healthcare Providers:  IncredibleEmployment.be  This test is no t yet approved or cleared by the [place] FDA and  has been authorized for detection and/or diagnosis of SARS-CoV-2 by FDA under an Emergency Use Authorization (EUA). This EUA will remain  in effect (meaning this test can be used) for the duration of the COVID-19 declaration under Section 564(b)(1) of the Act, 21 U.S.C.section 360bbb-3(b)(1), unless the  authorization is terminated  or revoked sooner.       Influenza A by PCR NEGATIVE NEGATIVE Final   Influenza B by PCR NEGATIVE NEGATIVE Final    Comment: (NOTE) The Xpert Xpress SARS-CoV-2/FLU/RSV plus assay is intended as an aid in the diagnosis of influenza from Nasopharyngeal swab specimens and should not be used as a sole basis for treatment. Nasal washings and  aspirates are unacceptable for Xpert Xpress SARS-CoV-2/FLU/RSV testing.  Fact Sheet for Patients: EntrepreneurPulse.com.au  Fact Sheet for Healthcare Providers: IncredibleEmployment.be  This test is not yet approved or cleared by the [place] FDA and has been authorized for detection and/or diagnosis of SARS-CoV-2 by FDA under an Emergency Use Authorization (EUA). This EUA will remain in effect (meaning this test can be used) for the duration of the COVID-19 declaration under Section 564(b)(1) of the Act, 21 U.S.C. section 360bbb-3(b)(1), unless the authorization is terminated or revoked.     Resp Syncytial Virus by PCR NEGATIVE NEGATIVE Final    Comment: (NOTE) Fact Sheet for Patients: EntrepreneurPulse.com.au  Fact Sheet for Healthcare Providers: IncredibleEmployment.be  This test is not yet approved or cleared by the [place] FDA and has been authorized for detection and/or diagnosis of SARS-CoV-2 by FDA under an Emergency Use Authorization (EUA). This EUA will remain in effect (meaning this test can be used) for the duration of the COVID-19 declaration under Section 564(b)(1) of the Act, 21 U.S.C. section 360bbb-3(b)(1), unless the authorization is terminated or revoked.  Performed at [facility], [place] [address] [place], [place] [address]   MRSA Next Gen by PCR, Nasal     Status: None   Collection Time: 09/06/22  1:43 AM   Specimen: Nasal Mucosa; Nasal Swab  Result Value Ref Range Status   MRSA by PCR Next Gen NOT  DETECTED NOT DETECTED Final    Comment: (NOTE) The GeneXpert MRSA Assay (FDA approved for NASAL specimens only), is one component of a comprehensive MRSA colonization surveillance program. It is not intended to diagnose MRSA infection nor to guide or monitor treatment for MRSA infections. Test performance is not FDA approved in patients less than 90 years old. Performed at [facility], [place] [address], [place], [place] [address]   Culture, Respiratory w Gram Stain     Status: None   Collection Time: 09/08/22 11:02 AM   Specimen: Tracheal Aspirate; Respiratory  Result Value Ref Range Status   Specimen Description TRACHEAL ASPIRATE  Final   Special Requests NONE  Final   Gram Stain   Final    NO WBC SEEN FEW GRAM POSITIVE COCCI IN CHAINS FEW GRAM NEGATIVE RODS RARE GRAM POSITIVE RODS    Culture   Final    ABUNDANT Normal respiratory flora-no Staph aureus or Pseudomonas seen Performed at [facility], [address], [place], [place] [address]    Report Status 09/10/2022 FINAL  Final  Culture, Respiratory w Gram Stain     Status: None (Preliminary result)   Collection Time: 09/10/22  5:48 PM   Specimen: Tracheal Aspirate; Respiratory  Result Value Ref Range Status   Specimen Description TRACHEAL ASPIRATE  Final   Special Requests NONE  Final   Gram Stain   Final    ABUNDANT WBC PRESENT, PREDOMINANTLY PMN FEW GRAM POSITIVE RODS RARE GRAM POSITIVE COCCI IN SINGLES IN PAIRS FEW GRAM NEGATIVE RODS Performed at [facility], [place] [address] [place], [place] [address]    Culture PENDING  Incomplete   Report Status PENDING  Incomplete  Culture, blood (Routine X 2) w Reflex to ID Panel     Status: None (Preliminary result)   Collection Time: 09/10/22  6:06 PM   Specimen: BLOOD  Result Value Ref Range Status   Specimen Description BLOOD SITE NOT SPECIFIED  Final   Special Requests   Final    BOTTLES DRAWN AEROBIC AND ANAEROBIC  Blood Culture adequate volume    Culture  Setup Time   Final    GRAM POSITIVE COCCI IN CLUSTERS AEROBIC BOTTLE ONLY CRITICAL RESULT CALLED TO, READ BACK BY AND VERIFIED WITH: PHARMD L.BELL AT 1206 ON 09/11/2022 BY T.SAAD. Performed at [facility], [place] [address] [place], [place] [address]    Culture GRAM POSITIVE COCCI  Final   Report Status PENDING  Incomplete  Culture, blood (Routine X 2) w Reflex to ID Panel     Status: None (Preliminary result)   Collection Time: 09/10/22  6:06 PM   Specimen: BLOOD  Result Value Ref Range Status   Specimen Description BLOOD SITE NOT SPECIFIED  Final   Special Requests   Final    BOTTLES DRAWN AEROBIC AND ANAEROBIC Blood Culture adequate volume   Culture  Setup Time   Final    GRAM POSITIVE COCCI IN CLUSTERS IN BOTH AEROBIC AND ANAEROBIC BOTTLES CRITICAL VALUE NOTED.  VALUE IS CONSISTENT WITH PREVIOUSLY REPORTED AND CALLED VALUE. Performed at [facility], [place] [address] [place], [place] [address]    Culture GRAM POSITIVE COCCI  Final   Report Status PENDING  Incomplete  Blood Culture ID Panel (Reflexed)     Status: Abnormal   Collection Time: 09/10/22  6:06 PM  Result Value Ref Range Status   Enterococcus faecalis NOT DETECTED NOT DETECTED Final   Enterococcus Faecium NOT DETECTED NOT DETECTED Final   Listeria monocytogenes NOT DETECTED NOT DETECTED Final   Staphylococcus species DETECTED (A) NOT DETECTED Final    Comment: CRITICAL RESULT CALLED TO, READ BACK BY AND VERIFIED WITH: PHARMD L.BELL AT 1206 ON 09/11/2022 BY T.SAAD.    Staphylococcus aureus (BCID) DETECTED (A) NOT DETECTED Final    Comment: CRITICAL RESULT CALLED TO, READ BACK BY AND VERIFIED WITH: PHARMD L.BELL AT 1206 ON 09/11/2022 BY T.SAAD.    Staphylococcus epidermidis NOT DETECTED NOT DETECTED Final   Staphylococcus lugdunensis NOT DETECTED NOT DETECTED Final   Streptococcus species NOT DETECTED NOT DETECTED Final   Streptococcus agalactiae NOT DETECTED NOT DETECTED Final   Streptococcus  pneumoniae NOT DETECTED NOT DETECTED Final   Streptococcus pyogenes NOT DETECTED NOT DETECTED Final   A.calcoaceticus-baumannii NOT DETECTED NOT DETECTED Final   Bacteroides fragilis NOT DETECTED NOT DETECTED Final   Enterobacterales NOT DETECTED NOT DETECTED Final   Enterobacter cloacae complex NOT DETECTED NOT DETECTED Final   Escherichia coli NOT DETECTED NOT DETECTED Final   Klebsiella aerogenes NOT DETECTED NOT DETECTED Final   Klebsiella oxytoca NOT DETECTED NOT DETECTED Final   Klebsiella pneumoniae NOT DETECTED NOT DETECTED Final   Proteus species NOT DETECTED NOT DETECTED Final   Salmonella species NOT DETECTED NOT DETECTED Final   Serratia marcescens NOT DETECTED NOT DETECTED Final   Haemophilus influenzae NOT DETECTED NOT DETECTED Final   Neisseria meningitidis NOT DETECTED NOT DETECTED Final   Pseudomonas aeruginosa NOT DETECTED NOT DETECTED Final   Stenotrophomonas maltophilia NOT DETECTED NOT DETECTED Final   Candida albicans NOT DETECTED NOT DETECTED Final   Candida auris NOT DETECTED NOT DETECTED Final   Candida glabrata NOT DETECTED NOT DETECTED Final   Candida krusei NOT DETECTED NOT DETECTED Final   Candida parapsilosis NOT DETECTED NOT DETECTED Final   Candida tropicalis NOT DETECTED NOT DETECTED Final   Cryptococcus neoformans/gattii NOT DETECTED NOT DETECTED Final   Meth resistant mecA/C and MREJ NOT DETECTED NOT DETECTED Final    Comment: Performed at [facility], [address] [place],  [place] [address]     Serology:    Imaging: If present, new imagings (plain films, ct scans, and mri) have been personally visualized and interpreted; radiology reports have been reviewed. Decision making incorporated into the Impression / Recommendations.  1/8 ct chest abd pelv with contrast 1. Peribronchovascular ground-glass airspace opacities of the lingula and bilateral lower lobes. Findings suggestive of infection/inflammation and consistent with chest  x-ray. Followup PA and lateral chest X-ray is recommended in 3-4 weeks following therapy to ensure resolution. 2. Trace right pleural effusion. Possible tiny trace left pleural effusion. 3. Cholelithiasis with suggestion of fundal gallbladder wall thickening/pericholecystic fluid. Correlate with liver function tests. Consider right upper quadrant ultrasound for a more sensitive evaluation of the gallbladder if clinically indicated. 4. No acute traumatic injury to the chest, abdomen, or pelvis. 5. No acute fracture or traumatic malalignment of the thoracic or lumbar spine. 6. Other imaging findings of potential clinical significance: Colonic diverticulosis with no acute diverticulitis. Cardiomegaly. Borderline prostatomegaly. Partially visualized at least small to moderate volume right hydrocele .Aortic Atherosclerosis (ICD10-I70.0) -including four-vessel coronary artery calcification   1/8 ct angio head 1. 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 The bilateral PCAs are supplied by posterior communicating arteries with likely retrograde flow into the P1 segments. 2. Multifocal high-grade stenosis of the right M2 and M3 branches relatively diminished perfusion corresponding to the chronic MCA distribution infarct. 3. Moderate to severe stenosis in the bilateral carotid siphons.   1/8 ct cspine 1. No acute intracranial abnormality. 2. No acute displaced fracture or traumatic listhesis of the cervical spine.   1/9 tte  1. Left ventricular ejection fraction, by estimation, is 20 to 25%. The  left ventricle has severely decreased function. The left ventricle  demonstrates global hypokinesis. The left ventricular internal cavity size  was severely dilated. Left ventricular  diastolic function could not be evaluated.   2. Right ventricular systolic function is mildly reduced. The right   ventricular size is mildly enlarged. There is moderately elevated  pulmonary artery systolic pressure. The estimated right ventricular  systolic pressure is 23.5 mmHg.   3. Left atrial size was severely dilated.   4. Right atrial size was severely dilated.   5. The mitral valve is grossly normal. Moderate mitral valve  regurgitation.   6. The tricuspid valve is abnormal. Tricuspid valve regurgitation is  moderate to severe.   7. The aortic valve is tricuspid. Aortic valve regurgitation is not  visualized. No aortic stenosis is present.   8. The inferior vena cava is dilated in size with <50% respiratory  variability, suggesting right atrial pressure of 15 mmHg.   Comparison(s): No significant change from prior study.    1/12 head ct 1. Stable non contrast CT appearance of brainstem and cerebellar infarcts. No malignant hemorrhagic transformation or significant posterior fossa mass effect. 2. No new intracranial abnormality. Chronic right hemisphere encephalomalacia.   1/13 cxr 1. Overall improved aeration and inspiratory volumes. 2. Stable and satisfactory support apparatus. 3. Persistent cardiomegaly. 4. Probable trace bilateral pleural effusions.     Jabier Mutton, [place] [facility] [place] [phone] pager    09/11/2022, 12:56 PM

## 2022-09-11 NOTE — Progress Notes (Addendum)
PHARMACY - PHYSICIAN COMMUNICATION CRITICAL VALUE ALERT - BLOOD CULTURE IDENTIFICATION (BCID)  Vincent [name] is an 87 y.o. male who presented to [facility] on 09/13/2022 after being found on the floor and bradycardic.  Found to have an MCA CVA.    Assessment: febrile.  BCx growing MSSA in 3 of 4 bottles thus far.  Recent L hip fracture.  Name of physician (or Provider) Contacted: Dr. Lynetta Mare and Dewaine Oats, NP  Current antibiotics: none  Changes to prescribed antibiotics recommended:  Recommendations accepted by provider - start Ancef 2gm IV Q8H F/u with ID consult, ECHO, repeat BCx  Results for orders placed or performed during the hospital encounter of 09/13/2022  Blood Culture ID Panel (Reflexed) (Collected: 09/10/2022  6:06 PM)  Result Value Ref Range   Enterococcus faecalis NOT DETECTED NOT DETECTED   Enterococcus Faecium NOT DETECTED NOT DETECTED   Listeria monocytogenes NOT DETECTED NOT DETECTED   Staphylococcus species DETECTED (A) NOT DETECTED   Staphylococcus aureus (BCID) DETECTED (A) NOT DETECTED   Staphylococcus epidermidis NOT DETECTED NOT DETECTED   Staphylococcus lugdunensis NOT DETECTED NOT DETECTED   Streptococcus species NOT DETECTED NOT DETECTED   Streptococcus agalactiae NOT DETECTED NOT DETECTED   Streptococcus pneumoniae NOT DETECTED NOT DETECTED   Streptococcus pyogenes NOT DETECTED NOT DETECTED   A.calcoaceticus-baumannii NOT DETECTED NOT DETECTED   Bacteroides fragilis NOT DETECTED NOT DETECTED   Enterobacterales NOT DETECTED NOT DETECTED   Enterobacter cloacae complex NOT DETECTED NOT DETECTED   Escherichia coli NOT DETECTED NOT DETECTED   Klebsiella aerogenes NOT DETECTED NOT DETECTED   Klebsiella oxytoca NOT DETECTED NOT DETECTED   Klebsiella pneumoniae NOT DETECTED NOT DETECTED   Proteus species NOT DETECTED NOT DETECTED   Salmonella species NOT DETECTED NOT DETECTED   Serratia marcescens NOT DETECTED NOT DETECTED   Haemophilus influenzae NOT DETECTED  NOT DETECTED   Neisseria meningitidis NOT DETECTED NOT DETECTED   Pseudomonas aeruginosa NOT DETECTED NOT DETECTED   Stenotrophomonas maltophilia NOT DETECTED NOT DETECTED   Candida albicans NOT DETECTED NOT DETECTED   Candida auris NOT DETECTED NOT DETECTED   Candida glabrata NOT DETECTED NOT DETECTED   Candida krusei NOT DETECTED NOT DETECTED   Candida parapsilosis NOT DETECTED NOT DETECTED   Candida tropicalis NOT DETECTED NOT DETECTED   Cryptococcus neoformans/gattii NOT DETECTED NOT DETECTED   Meth resistant mecA/C and MREJ NOT DETECTED NOT DETECTED    [name] D. Mina Marble, PharmD, BCPS, [place] 09/11/2022, 12:22 PM

## 2022-09-11 NOTE — Progress Notes (Signed)
ANTICOAGULATION CONSULT NOTE   Pharmacy Consult:  Heparin Indication: atrial fibrillation  No Known Allergies  Patient Measurements: Height: '5\' 9"'$  (175.3 cm) Weight: 75.7 kg (166 lb 14.2 oz) IBW/kg (Calculated) : 70.7 Heparin Dosing Weight: 76 kg  Vital Signs: Temp: 100.7 F (38.2 C) (01/13 2300) Temp Source: Oral (01/13 2300) BP: 120/53 (01/13 2000) Pulse Rate: 52 (01/13 2010)  Labs: Recent Labs    09/08/22 0551 09/08/22 1129 09/09/22 0454 09/10/22 0327 09/10/22 1455 09/10/22 2340  HGB 9.9* 8.8* 10.2* 10.4* 11.6*  --   HCT 33.3* 28.3* 33.1* 34.7* 34.0*  --   PLT 147* 145* 136* 142*  --   --   APTT  --  34  --   --   --  60*  LABPROT  --  14.9  --   --   --   --   INR  --  1.2  --   --   --   --   HEPARINUNFRC  --   --   --   --   --  0.21*  CREATININE 0.87  --  0.84 0.85  --   --      Estimated Creatinine Clearance: 64.7 mL/min (by C-G formula based on SCr of 0.85 mg/dL).   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 s/p remote stent to Cx 2004 with chronic stable angina in context of residual circumflex disease.   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  Patient admitted with MCA CVA s/p thrombectomy on 1/9.  He was started on IV heparin, which was then held due to large size of stroke.  Now stable and Pharmacy consulted to restart IV heparin until patient could take PO Eliquis.  CBC stable; no bleeding reported.  1/14 AM update:  Heparin level and aPTT sub-therapeutic Numbers correlating-no need for further aPTT's  Goal of Therapy:  Heparin level 0.3-0.5 units/ml Monitor platelets by anticoagulation protocol: Yes   Plan:  No bolus with CVA Inc heparin to 900 units/hr 0830 heparin level  Narda Bonds, PharmD, [place] Pharmacist Phone: [phone]

## 2022-09-11 NOTE — Progress Notes (Addendum)
NAME:  Vincent [name], MRN:  [id number], DOB:  February 22, 1936, LOS: 6 ADMISSION DATE:  09/06/2022, CONSULTATION DATE:  09/06/2022 REFERRING MD:  Marjory Lies - NIR CHIEF COMPLAINT:  S/p basilar artery occlusion, vent Management   History of Present Illness:  87 year old man who presented to [facility] ED 1/8 as a Code Stroke after being found down at home. Presented with R gaze preference, L-sided deficits and dysarthria. PMHx significant for HTN, HLD, AF (on Eliquis), CHF (EF 25-30%), CAD s/p stent (2004), prior CVA with residual L-sided deficits, COPD, T2DM.  CTA Head/Neck demonstrated distal basilar artery occlusion, left V4 occlusion. He was seen by neurology and IR and was taken to IR for revascularization of distal basilar artery; TICI 2C revascularization achieved. Post-procedure CT negative for hemorrhage.  Post-procedure, he was left intubated and was transferred to ICU.  PCCM was asked to see for vent management.  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 s/p remote stent to Cx 2004 with chronic stable angina in context of residual circumflex disease.   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 1/11 agitated with vent wean 1/12 less interactive. CT head with no acute findings. Failed SBT with tachypnea. Given lasix with 2L out   Interim History / Subjective:  Overnight with periods of A.Fib RVR. Tmax 103.1. Cultures sent. This AM remains off sedation   Objective:  Blood pressure (!) 172/80, pulse 88, temperature (!) 103.1 F (39.5 C), temperature source Axillary, resp. rate 16, height '5\' 9"'$  (1.753 m), weight 75.7 kg, SpO2 100 %.    Vent Mode: PRVC FiO2 (%):  [30 %-40 %] 40 % Set Rate:  [15 bmp] 15 bmp Vt Set:  [560 mL] 560 mL PEEP:  [5 cmH20] 5 cmH20 Plateau Pressure:  [7 cmH20-22 cmH20] 14 cmH20   Intake/Output Summary (Last 24 hours) at 09/11/2022 0945 Last data filed at 09/11/2022 0700 Gross per 24 hour  Intake 1326.56 ml  Output 1350 ml  Net -23.44 ml   Filed Weights   09/06/22 0200 09/06/22 0432  Weight: 84.2 kg 75.7 kg   General:  Ill appearing elderly male, intubated  HEENT: ETT/OG in place  Neuro: Opens eyes with verbal stimulation, does not follow commands CV: Irregular, HR 82, no mRG  PULM:  Coarse breaths sounds, mild tachypnea  GI: soft, bsx4 active  GU: urine cloudy, external foley in place  Extremities: warm/dry, +1 edema in BLE Skin: no rashes or lesions   Assessment & Plan:  Basilar artery occlusion Acute Encephalopathy  - s/p IR revascularization. CTA Head/Neck demonstrated distal basilar artery occlusion, left V4 occlusion. Taken to IR for revascularization of distal basilar artery; TICI 2C revascularization. Post-procedure CT negative for hemorrhage. Plan - Stroke team primary - Continues on ASA 325  - Goal SBP <180  - Precedex off, titrate for RASS goal 0/-1  - Neuroprotective measures: HOB > 30 degrees, normoglycemia, normothermia, electrolytes WNL  Respiratory insufficiency  - due to inability  to protect the airway in the setting of above. - CXR 1/11 with cardiomegaly, mild pulmonary vascular congestion  Hx COPD Hemoptysis Plan -Maintain full vent support with SAT/SBT as tolerated > 1/13 PS 8/5 for 2 hours prior to becoming tachypnea and diaphoretic  -titrate Vent setting to maintain SpO2 greater than or equal to 90%. -HOB elevated 30 degrees. -Plateau pressures less than 30 cm H20.  -Follow chest x-ray, ABG prn.   -Bronchial hygiene and RT/bronchodilator protocol. -continue Lasix '40mg'$  qd   Hx A.fib on Eliquis, sCHF (echo from Aug 2023 with EF 25%-30%) - Echo with EF 20-25% similar to prior CAD s/p stenting HTN HLD HFrEF - heparin gtt restarted 1/13  - BP management as above - Continue statin, resume digoxin as appropriate - Hold Imdur, Coreg; resume as clinically appropriate >> restart home Coreg at half dose  - Continue home spironolactone and Entresto   Febrile - WBC 8.9 (6.6)   Plan - Trend WBC and Fever Curve - Send PCT - Follow Cultures from 1/13 - RVP pending  - Send Strep and Legionellae  - Lower Ex U/S to r/o DVT   Hypophosphatemia  Plan - Trend BMP - Replacing now   Hx DM Hemoglobin A1C 6.5% - SSI - CBGs Q4H - Goal CBG 140-180 - Hold home medications  Hematuria -pt pulled at foley 1/10, perhaps secondary to trauma >> no further noted, external cath in place   Best practice (evaluated daily):  Diet/type: TF DVT prophylaxis: SCD GI prophylaxis: PPI Lines: N/A Foley:  External foley in place  Code Status:  full code Last date of multidisciplinary goals of care discussion: Per primary  Critical care time: 35 minutes   CRITICAL CARE Performed by: Omar Person   Total critical care time: 35 minutes  Critical care time was exclusive of separately billable procedures and treating other patients.  Critical care was necessary to treat or prevent imminent or life-threatening deterioration.  Critical care was time spent personally  by me on the following activities: development of treatment plan with patient and/or surrogate as well as nursing, discussions with consultants, evaluation of patient's response to treatment, examination of patient, obtaining history from patient or surrogate, ordering and performing treatments and interventions, ordering and review of laboratory studies, ordering and review of radiographic studies, pulse oximetry and re-evaluation of patient's condition.  Hayden Pedro, AGACNP-BC [facility]  PCCM Pgr: [phone]   Please see [email or web address] for pager details.  From 7A-7P if no response, please call [phone] After hours, please call ELink [phone]

## 2022-09-12 ENCOUNTER — Inpatient Hospital Stay (HOSPITAL_COMMUNITY): Payer: Medicare Other

## 2022-09-12 DIAGNOSIS — L538 Other specified erythematous conditions: Secondary | ICD-10-CM

## 2022-09-12 DIAGNOSIS — R7881 Bacteremia: Secondary | ICD-10-CM

## 2022-09-12 DIAGNOSIS — I639 Cerebral infarction, unspecified: Secondary | ICD-10-CM | POA: Diagnosis not present

## 2022-09-12 DIAGNOSIS — B9561 Methicillin susceptible Staphylococcus aureus infection as the cause of diseases classified elsewhere: Secondary | ICD-10-CM | POA: Diagnosis not present

## 2022-09-12 DIAGNOSIS — Z9889 Other specified postprocedural states: Secondary | ICD-10-CM | POA: Diagnosis not present

## 2022-09-12 LAB — PROCALCITONIN: Procalcitonin: 1.2 ng/mL

## 2022-09-12 LAB — SODIUM
Sodium: 144 mmol/L (ref 135–145)
Sodium: 146 mmol/L — ABNORMAL HIGH (ref 135–145)
Sodium: 147 mmol/L — ABNORMAL HIGH (ref 135–145)

## 2022-09-12 LAB — CBC
HCT: 30.1 % — ABNORMAL LOW (ref 39.0–52.0)
Hemoglobin: 9.2 g/dL — ABNORMAL LOW (ref 13.0–17.0)
MCH: 26.6 pg (ref 26.0–34.0)
MCHC: 30.6 g/dL (ref 30.0–36.0)
MCV: 87 fL (ref 80.0–100.0)
Platelets: 122 10*3/uL — ABNORMAL LOW (ref 150–400)
RBC: 3.46 MIL/uL — ABNORMAL LOW (ref 4.22–5.81)
RDW: 18.3 % — ABNORMAL HIGH (ref 11.5–15.5)
WBC: 7.3 10*3/uL (ref 4.0–10.5)
nRBC: 0 % (ref 0.0–0.2)

## 2022-09-12 LAB — ECHOCARDIOGRAM LIMITED
Calc EF: 24.5 %
Height: 69 in
S' Lateral: 6 cm
Single Plane A2C EF: 28.5 %
Single Plane A4C EF: 25.1 %
Weight: 2670.21 oz

## 2022-09-12 LAB — GLUCOSE, CAPILLARY
Glucose-Capillary: 122 mg/dL — ABNORMAL HIGH (ref 70–99)
Glucose-Capillary: 161 mg/dL — ABNORMAL HIGH (ref 70–99)
Glucose-Capillary: 166 mg/dL — ABNORMAL HIGH (ref 70–99)
Glucose-Capillary: 190 mg/dL — ABNORMAL HIGH (ref 70–99)
Glucose-Capillary: 205 mg/dL — ABNORMAL HIGH (ref 70–99)
Glucose-Capillary: 237 mg/dL — ABNORMAL HIGH (ref 70–99)

## 2022-09-12 LAB — BASIC METABOLIC PANEL
Anion gap: 10 (ref 5–15)
BUN: 25 mg/dL — ABNORMAL HIGH (ref 8–23)
CO2: 26 mmol/L (ref 22–32)
Calcium: 7.8 mg/dL — ABNORMAL LOW (ref 8.9–10.3)
Chloride: 107 mmol/L (ref 98–111)
Creatinine, Ser: 1.01 mg/dL (ref 0.61–1.24)
GFR, Estimated: >60 mL/min (ref >60)
Glucose, Bld: 208 mg/dL — ABNORMAL HIGH (ref 70–99)
Potassium: 3.6 mmol/L (ref 3.5–5.1)
Sodium: 143 mmol/L (ref 135–145)

## 2022-09-12 LAB — MAGNESIUM: Magnesium: 2 mg/dL (ref 1.7–2.4)

## 2022-09-12 LAB — HEPARIN LEVEL (UNFRACTIONATED): Heparin Unfractionated: 0.42 IU/mL (ref 0.30–0.70)

## 2022-09-12 LAB — PHOSPHORUS: Phosphorus: 2.6 mg/dL (ref 2.5–4.6)

## 2022-09-12 MED ORDER — INSULIN ASPART 100 UNIT/ML IJ SOLN
4.0000 [IU] | INTRAMUSCULAR | Status: DC
  Administered 2022-09-12 – 2022-09-14 (×13): 4 [IU] via SUBCUTANEOUS

## 2022-09-12 MED ORDER — SODIUM CHLORIDE 3 % IV SOLN
INTRAVENOUS | Status: DC
  Filled 2022-09-12 (×3): qty 500

## 2022-09-12 MED ORDER — POTASSIUM CHLORIDE 20 MEQ PO PACK
40.0000 meq | PACK | Freq: Once | ORAL | Status: AC
  Administered 2022-09-12: 40 meq
  Filled 2022-09-12: qty 2

## 2022-09-12 NOTE — Plan of Care (Addendum)
Contacted by Radiology regarding interval appearance of a small focus of hemorrhagic conversion within infarcted tissue of the right cerebellum on repeat CT obtained on Sunday afternoon at 4:23 PM, relative to CT done on 1/12. Due to technical error, the CT was only just sent to Radiologist for reading. I have viewed the images and discussed with Dr. Jeannine Boga.   Will hold IV heparin due to risk of worsening hemorrhage. To assess for stability, will obtain repeat CT scan at 5 AM, which will be 12 hours since the prior CT.   Will continue ASA given that its benefit for stroke prophylaxis is felt to outweigh the bleeding risk regarding the new small focus of hemorrhage seen on CT.   Addendum: Repeat CT head (09/12/2022 05:41): Stable post-cerebellar infarction petechial hemorrhage (Heidelberg classification 1c: PH1), most apparent in the right cerebellar hemisphere. No significant posterior fossa mass effect or other complicating features. Recent bilateral cerebellar and brainstem infarcts with chronic supratentorial ischemia and no new intracranial abnormality identified.    Electronically signed: Dr. Kerney Elbe

## 2022-09-12 NOTE — Consult Note (Signed)
Reason for Consult:Left hip effusion Referring Physician: Prudencio Pair Time called: [id number] Time at bedside: 1114   Vincent [name] is an 87 y.o. male.  HPI: Bryley was admitted 6d ago after falling at home and was subsequently diagnosed with a stroke. CT scan showed soft tissue edema around the left hip and orthopedic surgery was consulted. Of note he had a fall 11/23 where he fractured the acetabulum and pubic rami on that side and these were redemonstrated. He is intubated and cannot contribute to history but was alert and could answer yes/no questions.  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 s/p remote stent to Cx 2004 with chronic stable angina in context of residual circumflex disease.   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/25/2015   NSVT (nonsustained ventricular tachycardia) (HCC)    Permanent atrial fibrillation (HCC)    Pulmonary eosinophilia (HCC)    Sinus bradycardia    Stroke [facility])    Syncope and collapse    Unspecified glaucoma(365.9)     Past Surgical History:  Procedure Laterality Date   CARDIAC CATHETERIZATION N/A 07/30/2015   Procedure: Left Heart Cath and Coronary Angiography;  Surgeon: Belva Crome, MD;  Location: [place] [facility];  Service: Cardiovascular;  Laterality: N/A;   COLONOSCOPY     IR ANGIO INTRA EXTRACRAN SEL INTERNAL CAROTID UNI R MOD SED  09/06/2022   IR CT HEAD LTD  09/06/2022   IR PERCUTANEOUS ART THROMBECTOMY/INFUSION INTRACRANIAL INC DIAG ANGIO  09/06/2022    IR US GUIDE VASC ACCESS RIGHT  09/06/2022   KNEE SURGERY     loop recorder  09/2007   RADIOLOGY WITH ANESTHESIA N/A 09/18/2022   Procedure: RADIOLOGY WITH ANESTHESIA;  Surgeon: Radiologist, Medication, MD;  Location: [place];  Service: Radiology;  Laterality: N/A;   RIGHT/LEFT HEART CATH AND CORONARY ANGIOGRAPHY N/A 09/20/2021   Procedure: RIGHT/LEFT HEART CATH AND CORONARY ANGIOGRAPHY;  Surgeon: Martinique, Peter M, MD;  Location: [place] [facility];  Service: Cardiovascular;  Laterality: N/A;    Family History  Problem Relation Age of Onset   Colon cancer Mother    Hypertension Mother    Cancer Mother    Prostate cancer Brother    Cancer Brother    Heart disease Father    Heart attack Father    Hypertension Father    Heart attack Sister    Cancer Sister    Stroke Neg Hx    Colon polyps Neg Hx    Esophageal cancer Neg Hx    Stomach cancer Neg Hx    Rectal cancer Neg Hx    Pancreatic cancer Neg Hx     Social History:  reports that he quit smoking about 39 years ago. His smoking use included cigarettes. He has a 7.50 pack-year smoking history. He has never used smokeless tobacco. He reports that he does not drink alcohol and does not use drugs.  Allergies: No Known Allergies  Medications: I have reviewed the patient's current medications.  Results for orders placed or performed during the hospital encounter of 09/17/2022 (from the past 48 hour(s))  Glucose, capillary     Status: Abnormal   Collection Time: 09/10/22 12:12 PM  Result Value Ref Range   Glucose-Capillary 175 (H) 70 - 99 mg/dL    Comment: Glucose reference range applies only to samples taken after fasting for at least 8 hours.  I-STAT 7, (LYTES, BLD GAS, ICA, H+H)     Status: Abnormal   Collection Time: 09/10/22  2:55 PM  Result Value Ref Range   pH, Arterial 7.442 7.35 - 7.45   pCO2 arterial 40.5 32 - 48 mmHg   pO2, Arterial 132 (H) 83 - 108 mmHg   Bicarbonate 27.7 20.0 - 28.0 mmol/L   TCO2 29 22 - 32 mmol/L   O2  Saturation 99 %   Acid-Base Excess 3.0 (H) 0.0 - 2.0 mmol/L   Sodium 146 (H) 135 - 145 mmol/L   Potassium 4.3 3.5 - 5.1 mmol/L   Calcium, Ion 1.20 1.15 - 1.40 mmol/L   HCT 34.0 (L) 39.0 - 52.0 %   Hemoglobin 11.6 (L) 13.0 - 17.0 g/dL   Patient temperature 98.2 F    Collection site RADIAL, ALLEN'S TEST ACCEPTABLE    Drawn by RT    Sample type ARTERIAL   Glucose, capillary     Status: Abnormal   Collection Time: 09/10/22  3:39 PM  Result Value Ref Range   Glucose-Capillary 144 (H) 70 - 99 mg/dL    Comment: Glucose reference range applies only to samples taken after fasting for at least 8 hours.  Culture, Respiratory w Gram Stain     Status: None (Preliminary result)   Collection Time: 09/10/22  5:48 PM   Specimen: Tracheal Aspirate; Respiratory  Result Value Ref Range   Specimen Description TRACHEAL ASPIRATE    Special Requests NONE    Gram Stain      ABUNDANT WBC PRESENT, PREDOMINANTLY PMN FEW GRAM POSITIVE RODS RARE GRAM POSITIVE COCCI IN SINGLES IN PAIRS FEW GRAM NEGATIVE RODS Performed at [facility], [place] [address], [place], [place] [address]    Culture FEW ENTEROBACTER GERGOVIAE    Report Status PENDING    Organism ID, Bacteria ENTEROBACTER GERGOVIAE       Susceptibility   Enterobacter gergoviae - MIC*    CEFAZOLIN <=4 SENSITIVE Sensitive     CEFEPIME <=0.12 SENSITIVE Sensitive     CEFTAZIDIME <=1 SENSITIVE Sensitive     CEFTRIAXONE <=0.25 SENSITIVE Sensitive     CIPROFLOXACIN <=0.25 SENSITIVE Sensitive     GENTAMICIN <=1 SENSITIVE Sensitive     IMIPENEM <=0.25 SENSITIVE Sensitive     TRIMETH/SULFA <=20 SENSITIVE Sensitive     PIP/TAZO <=4 SENSITIVE Sensitive     * FEW ENTEROBACTER GERGOVIAE  Culture, blood (Routine X 2) w Reflex to ID Panel     Status: Abnormal (Preliminary result)   Collection Time: 09/10/22  6:06 PM   Specimen: BLOOD  Result Value Ref Range   Specimen Description BLOOD SITE NOT SPECIFIED    Special Requests      BOTTLES DRAWN AEROBIC  AND ANAEROBIC Blood Culture adequate volume   Culture  Setup Time      GRAM POSITIVE COCCI IN CLUSTERS IN BOTH AEROBIC AND ANAEROBIC BOTTLES CRITICAL RESULT CALLED TO, READ BACK BY AND VERIFIED WITH: PHARMD L.BELL AT 1206 ON 09/11/2022 BY T.SAAD.    Culture (A)     STAPHYLOCOCCUS  AUREUS SUSCEPTIBILITIES TO FOLLOW Performed at [facility], [place] [address], [place], [place] [address]    Report Status PENDING   Culture, blood (Routine X 2) w Reflex to ID Panel     Status: None (Preliminary result)   Collection Time: 09/10/22  6:06 PM   Specimen: BLOOD  Result Value Ref Range   Specimen Description BLOOD SITE NOT SPECIFIED    Special Requests      BOTTLES DRAWN AEROBIC AND ANAEROBIC Blood Culture adequate volume   Culture  Setup Time      GRAM POSITIVE COCCI IN CLUSTERS IN BOTH AEROBIC AND ANAEROBIC BOTTLES CRITICAL VALUE NOTED.  VALUE IS CONSISTENT WITH PREVIOUSLY REPORTED AND CALLED VALUE. Performed at [facility], [place] [address] [place], [place] [address]    Culture GRAM POSITIVE COCCI    Report Status PENDING   Blood Culture ID Panel (Reflexed)     Status: Abnormal   Collection Time: 09/10/22  6:06 PM  Result Value Ref Range   Enterococcus faecalis NOT DETECTED NOT DETECTED   Enterococcus Faecium NOT DETECTED NOT DETECTED   Listeria monocytogenes NOT DETECTED NOT DETECTED   Staphylococcus species DETECTED (A) NOT DETECTED    Comment: CRITICAL RESULT CALLED TO, READ BACK BY AND VERIFIED WITH: PHARMD L.BELL AT 1206 ON 09/11/2022 BY T.SAAD.    Staphylococcus aureus (BCID) DETECTED (A) NOT DETECTED    Comment: CRITICAL RESULT CALLED TO, READ BACK BY AND VERIFIED WITH: PHARMD L.BELL AT 1206 ON 09/11/2022 BY T.SAAD.    Staphylococcus epidermidis NOT DETECTED NOT DETECTED   Staphylococcus lugdunensis NOT DETECTED NOT DETECTED   Streptococcus species NOT DETECTED NOT DETECTED   Streptococcus agalactiae NOT DETECTED NOT DETECTED   Streptococcus pneumoniae NOT  DETECTED NOT DETECTED   Streptococcus pyogenes NOT DETECTED NOT DETECTED   A.calcoaceticus-baumannii NOT DETECTED NOT DETECTED   Bacteroides fragilis NOT DETECTED NOT DETECTED   Enterobacterales NOT DETECTED NOT DETECTED   Enterobacter cloacae complex NOT DETECTED NOT DETECTED   Escherichia coli NOT DETECTED NOT DETECTED   Klebsiella aerogenes NOT DETECTED NOT DETECTED   Klebsiella oxytoca NOT DETECTED NOT DETECTED   Klebsiella pneumoniae NOT DETECTED NOT DETECTED   Proteus species NOT DETECTED NOT DETECTED   Salmonella species NOT DETECTED NOT DETECTED   Serratia marcescens NOT DETECTED NOT DETECTED   Haemophilus influenzae NOT DETECTED NOT DETECTED   Neisseria meningitidis NOT DETECTED NOT DETECTED   Pseudomonas aeruginosa NOT DETECTED NOT DETECTED   Stenotrophomonas maltophilia NOT DETECTED NOT DETECTED   Candida albicans NOT DETECTED NOT DETECTED   Candida auris NOT DETECTED NOT DETECTED   Candida glabrata NOT DETECTED NOT DETECTED   Candida krusei NOT DETECTED NOT DETECTED   Candida parapsilosis NOT DETECTED NOT DETECTED   Candida tropicalis NOT DETECTED NOT DETECTED   Cryptococcus neoformans/gattii NOT DETECTED NOT DETECTED   Meth resistant mecA/C and MREJ NOT DETECTED NOT DETECTED    Comment: Performed at [facility], [address], [place], [place] [address]  Glucose, capillary     Status: Abnormal   Collection Time: 09/10/22  8:43 PM  Result Value Ref Range   Glucose-Capillary 165 (H) 70 - 99 mg/dL    Comment: Glucose reference range applies only to samples taken after fasting for at least 8 hours.  Heparin level (unfractionated)     Status: Abnormal   Collection Time: 09/10/22 11:40 PM  Result Value Ref Range   Heparin Unfractionated 0.21 (L) 0.30 - 0.70 IU/mL    Comment: (NOTE)  The clinical reportable range upper limit is being lowered to >1.10 to align with the FDA approved guidance for the current laboratory assay.  If heparin results are below expected  values, and patient dosage has  been confirmed, suggest follow up testing of antithrombin III levels. Performed at [facility], [place] [address] [place], [place] [address]   APTT     Status: Abnormal   Collection Time: 09/10/22 11:40 PM  Result Value Ref Range   aPTT 60 (H) 24 - 36 seconds    Comment:        IF BASELINE aPTT IS ELEVATED, SUGGEST PATIENT RISK ASSESSMENT BE USED TO DETERMINE APPROPRIATE ANTICOAGULANT THERAPY. Performed at [facility], [place] [address], [place], [place] [address]   Glucose, capillary     Status: Abnormal   Collection Time: 09/11/22 12:15 AM  Result Value Ref Range   Glucose-Capillary 220 (H) 70 - 99 mg/dL    Comment: Glucose reference range applies only to samples taken after fasting for at least 8 hours.  Basic metabolic panel     Status: Abnormal   Collection Time: 09/11/22  3:52 AM  Result Value Ref Range   Sodium 144 135 - 145 mmol/L   Potassium 3.6 3.5 - 5.1 mmol/L   Chloride 107 98 - 111 mmol/L   CO2 29 22 - 32 mmol/L   Glucose, Bld 214 (H) 70 - 99 mg/dL    Comment: Glucose reference range applies only to samples taken after fasting for at least 8 hours.   BUN 24 (H) 8 - 23 mg/dL   Creatinine, Ser 0.96 0.61 - 1.24 mg/dL   Calcium 8.0 (L) 8.9 - 10.3 mg/dL   GFR, Estimated >60 >60 mL/min    Comment: (NOTE) Calculated using the CKD-EPI Creatinine Equation (2021)    Anion gap 8 5 - 15    Comment: Performed at [facility] [address] [place], [place] [address]  CBC     Status: Abnormal   Collection Time: 09/11/22  3:52 AM  Result Value Ref Range   WBC 8.9 4.0 - 10.5 K/uL   RBC 3.70 (L) 4.22 - 5.81 MIL/uL   Hemoglobin 9.7 (L) 13.0 - 17.0 g/dL   HCT 32.6 (L) 39.0 - 52.0 %   MCV 88.1 80.0 - 100.0 fL   MCH 26.2 26.0 - 34.0 pg   MCHC 29.8 (L) 30.0 - 36.0 g/dL   RDW 18.3 (H) 11.5 - 15.5 %   Platelets 122 (L) 150 - 400 K/uL   nRBC 0.0 0.0 - 0.2 %    Comment: Performed at [facility] [address]  [place], [place] [address]  Magnesium     Status: None   Collection Time: 09/11/22  3:52 AM  Result Value Ref Range   Magnesium 2.0 1.7 - 2.4 mg/dL    Comment: Performed at [facility] [address] [place], [place] [address]  Phosphorus     Status: Abnormal   Collection Time: 09/11/22  3:52 AM  Result Value Ref Range   Phosphorus 2.1 (L) 2.5 - 4.6 mg/dL    Comment: Performed at [facility] [address], [place], [place] [address]  Glucose, capillary     Status: Abnormal   Collection Time: 09/11/22  3:57 AM  Result Value Ref Range   Glucose-Capillary 204 (H) 70 - 99 mg/dL    Comment: Glucose reference range applies only to samples taken after fasting for  at least 8 hours.  Glucose, capillary     Status: Abnormal   Collection Time: 09/11/22  8:20 AM  Result Value Ref Range   Glucose-Capillary 180 (H) 70 - 99 mg/dL    Comment: Glucose reference range applies only to samples taken after fasting for at least 8 hours.  Heparin level (unfractionated)     Status: None   Collection Time: 09/11/22 10:27 AM  Result Value Ref Range   Heparin Unfractionated 0.48 0.30 - 0.70 IU/mL    Comment: (NOTE) The clinical reportable range upper limit is being lowered to >1.10 to align with the FDA approved guidance for the current laboratory assay.  If heparin results are below expected values, and patient dosage has  been confirmed, suggest follow up testing of antithrombin III levels. Performed at [facility], [place] [address] [place], [place] [address]   Procalcitonin - Baseline     Status: None   Collection Time: 09/11/22 10:27 AM  Result Value Ref Range   Procalcitonin 1.33 ng/mL    Comment:        Interpretation: PCT > 0.5 ng/mL and <= 2 ng/mL: Systemic infection (sepsis) is possible, but other conditions are known to elevate PCT as well. (NOTE)       Sepsis PCT Algorithm           Lower Respiratory Tract                                      Infection PCT Algorithm     ----------------------------     ----------------------------         PCT < 0.25 ng/mL                PCT < 0.10 ng/mL          Strongly encourage             Strongly discourage   discontinuation of antibiotics    initiation of antibiotics    ----------------------------     -----------------------------       PCT 0.25 - 0.50 ng/mL            PCT 0.10 - 0.25 ng/mL               OR       >80% decrease in PCT            Discourage initiation of                                            antibiotics      Encourage discontinuation           of antibiotics    ----------------------------     -----------------------------         PCT >= 0.50 ng/mL              PCT 0.26 - 0.50 ng/mL                AND       <80% decrease in PCT             Encourage initiation of  antibiotics       Encourage continuation           of antibiotics    ----------------------------     -----------------------------        PCT >= 0.50 ng/mL                  PCT > 0.50 ng/mL               AND         increase in PCT                  Strongly encourage                                      initiation of antibiotics    Strongly encourage escalation           of antibiotics                                     -----------------------------                                           PCT <= 0.25 ng/mL                                                 OR                                        > 80% decrease in PCT                                      Discontinue / Do not initiate                                             antibiotics  Performed at [facility], [address], [place], [place] [address]   Glucose, capillary     Status: Abnormal   Collection Time: 09/11/22 11:58 AM  Result Value Ref Range   Glucose-Capillary 180 (H) 70 - 99 mg/dL    Comment: Glucose reference range applies only to samples taken after fasting for at least 8 hours.  Urinalysis,  Routine w reflex microscopic Urine, In & Out Cath     Status: Abnormal   Collection Time: 09/11/22  1:06 PM  Result Value Ref Range   Color, Urine YELLOW YELLOW   APPearance CLOUDY (A) CLEAR   Specific Gravity, Urine 1.010 1.005 - 1.030   pH 5.0 5.0 - 8.0   Glucose, UA 50 (A) NEGATIVE mg/dL   Hgb urine dipstick LARGE (A) NEGATIVE   Bilirubin Urine NEGATIVE NEGATIVE   Ketones, ur NEGATIVE NEGATIVE mg/dL   Protein, ur 30 (A) NEGATIVE mg/dL   Nitrite NEGATIVE NEGATIVE   Leukocytes,Ua NEGATIVE NEGATIVE  RBC / HPF >50 (H) 0 - 5 RBC/hpf   WBC, UA 0-5 0 - 5 WBC/hpf   Bacteria, UA MANY (A) NONE SEEN   Squamous Epithelial / HPF 0-5 0 - 5 /HPF   Mucus PRESENT     Comment: Performed at [facility], [place] [address], [place], [place] [address]  Respiratory (~20 pathogens) panel by PCR     Status: None   Collection Time: 09/11/22  1:06 PM   Specimen: Nasopharyngeal Swab; Respiratory  Result Value Ref Range   Adenovirus NOT DETECTED NOT DETECTED   Coronavirus 229E NOT DETECTED NOT DETECTED    Comment: (NOTE) The Coronavirus on the Respiratory Panel, DOES NOT test for the novel  Coronavirus (2019 nCoV)    Coronavirus HKU1 NOT DETECTED NOT DETECTED   Coronavirus NL63 NOT DETECTED NOT DETECTED   Coronavirus OC43 NOT DETECTED NOT DETECTED   Metapneumovirus NOT DETECTED NOT DETECTED   Rhinovirus / Enterovirus NOT DETECTED NOT DETECTED   Influenza A NOT DETECTED NOT DETECTED   Influenza B NOT DETECTED NOT DETECTED   Parainfluenza Virus 1 NOT DETECTED NOT DETECTED   Parainfluenza Virus 2 NOT DETECTED NOT DETECTED   Parainfluenza Virus 3 NOT DETECTED NOT DETECTED   Parainfluenza Virus 4 NOT DETECTED NOT DETECTED   Respiratory Syncytial Virus NOT DETECTED NOT DETECTED   Bordetella pertussis NOT DETECTED NOT DETECTED   Bordetella Parapertussis NOT DETECTED NOT DETECTED   Chlamydophila pneumoniae NOT DETECTED NOT DETECTED   Mycoplasma pneumoniae NOT DETECTED NOT DETECTED    Comment:  Performed at [facility], [place] [address], [place],  [address]  Strep pneumoniae urinary antigen     Status: None   Collection Time: 09/11/22  1:06 PM  Result Value Ref Range   Strep Pneumo Urinary Antigen NEGATIVE NEGATIVE    Comment:        Infection due to S. pneumoniae cannot be absolutely ruled out since the antigen present may be below the detection limit of the test. Performed at [facility], [address], [place], [place] [address]   Glucose, capillary     Status: Abnormal   Collection Time: 09/11/22  4:28 PM  Result Value Ref Range   Glucose-Capillary 141 (H) 70 - 99 mg/dL    Comment: Glucose reference range applies only to samples taken after fasting for at least 8 hours.  Glucose, capillary     Status: Abnormal   Collection Time: 09/11/22  7:53 PM  Result Value Ref Range   Glucose-Capillary 201 (H) 70 - 99 mg/dL    Comment: Glucose reference range applies only to samples taken after fasting for at least 8 hours.  Glucose, capillary     Status: Abnormal   Collection Time: 09/11/22 11:43 PM  Result Value Ref Range   Glucose-Capillary 189 (H) 70 - 99 mg/dL    Comment: Glucose reference range applies only to samples taken after fasting for at least 8 hours.  Basic metabolic panel     Status: Abnormal   Collection Time: 09/12/22 12:27 AM  Result Value Ref Range   Sodium 143 135 - 145 mmol/L   Potassium 3.6 3.5 - 5.1 mmol/L   Chloride 107 98 - 111 mmol/L   CO2 26 22 - 32 mmol/L   Glucose, Bld 208 (H) 70 - 99 mg/dL    Comment: Glucose reference range applies only to samples taken after fasting for at least 8 hours.   BUN 25 (H) 8 - 23 mg/dL  Creatinine, Ser 1.01 0.61 - 1.24 mg/dL   Calcium 7.8 (L) 8.9 - 10.3 mg/dL   GFR, Estimated >60 >60 mL/min    Comment: (NOTE) Calculated using the CKD-EPI Creatinine Equation (2021)    Anion gap 10 5 - 15    Comment: Performed at [facility] [address], [place], [place] [address]  Magnesium      Status: None   Collection Time: 09/12/22 12:27 AM  Result Value Ref Range   Magnesium 2.0 1.7 - 2.4 mg/dL    Comment: Performed at [facility], [place] [address], [place], [place] [address]  Phosphorus     Status: None   Collection Time: 09/12/22 12:27 AM  Result Value Ref Range   Phosphorus 2.6 2.5 - 4.6 mg/dL    Comment: Performed at [facility] [address], [place], [place] [address]  Heparin level (unfractionated)     Status: None   Collection Time: 09/12/22 12:27 AM  Result Value Ref Range   Heparin Unfractionated 0.42 0.30 - 0.70 IU/mL    Comment: (NOTE) The clinical reportable range upper limit is being lowered to >1.10 to align with the FDA approved guidance for the current laboratory assay.  If heparin results are below expected values, and patient dosage has  been confirmed, suggest follow up testing of antithrombin III levels. Performed at [facility], [place] [address] [place], [place] [address]   CBC     Status: Abnormal   Collection Time: 09/12/22 12:27 AM  Result Value Ref Range   WBC 7.3 4.0 - 10.5 K/uL   RBC 3.46 (L) 4.22 - 5.81 MIL/uL   Hemoglobin 9.2 (L) 13.0 - 17.0 g/dL   HCT 30.1 (L) 39.0 - 52.0 %   MCV 87.0 80.0 - 100.0 fL   MCH 26.6 26.0 - 34.0 pg   MCHC 30.6 30.0 - 36.0 g/dL   RDW 18.3 (H) 11.5 - 15.5 %   Platelets 122 (L) 150 - 400 K/uL   nRBC 0.0 0.0 - 0.2 %    Comment: Performed at [facility], [place] [address], [place], [place] [address]  Procalcitonin     Status: None   Collection Time: 09/12/22 12:27 AM  Result Value Ref Range   Procalcitonin 1.20 ng/mL    Comment:        Interpretation: PCT > 0.5 ng/mL and <= 2 ng/mL: Systemic infection (sepsis) is possible, but other conditions are known to elevate PCT as well. (NOTE)       Sepsis PCT Algorithm           Lower Respiratory Tract                                      Infection PCT Algorithm    ----------------------------     ----------------------------          PCT < 0.25 ng/mL                PCT < 0.10 ng/mL          Strongly encourage             Strongly discourage   discontinuation of antibiotics    initiation of antibiotics    ----------------------------     -----------------------------       PCT 0.25 - 0.50 ng/mL  PCT 0.10 - 0.25 ng/mL               OR       >80% decrease in PCT            Discourage initiation of                                            antibiotics      Encourage discontinuation           of antibiotics    ----------------------------     -----------------------------         PCT >= 0.50 ng/mL              PCT 0.26 - 0.50 ng/mL                AND       <80% decrease in PCT             Encourage initiation of                                             antibiotics       Encourage continuation           of antibiotics    ----------------------------     -----------------------------        PCT >= 0.50 ng/mL                  PCT > 0.50 ng/mL               AND         increase in PCT                  Strongly encourage                                      initiation of antibiotics    Strongly encourage escalation           of antibiotics                                     -----------------------------                                           PCT <= 0.25 ng/mL                                                 OR                                        > 80% decrease in PCT  Discontinue / Do not initiate                                             antibiotics  Performed at [facility], [place] [address], [place], [place] [address]   Glucose, capillary     Status: Abnormal   Collection Time: 09/12/22  3:56 AM  Result Value Ref Range   Glucose-Capillary 205 (H) 70 - 99 mg/dL    Comment: Glucose reference range applies only to samples taken after fasting for at least 8 hours.  Glucose, capillary     Status: Abnormal   Collection Time: 09/12/22  7:42 AM  Result  Value Ref Range   Glucose-Capillary 161 (H) 70 - 99 mg/dL    Comment: Glucose reference range applies only to samples taken after fasting for at least 8 hours.  Sodium     Status: None   Collection Time: 09/12/22 10:12 AM  Result Value Ref Range   Sodium 144 135 - 145 mmol/L    Comment: Performed at [facility] [address], [place], [place] [address]    ECHOCARDIOGRAM LIMITED  Result Date: 09/12/2022    ECHOCARDIOGRAM LIMITED REPORT   Patient Name:   Vincent [name] Date of Exam: 09/12/2022 Medical Rec #:  [id number]      Height:       69.0 in Accession #:    [id number]     Weight:       166.9 lb Date of Birth:  1936/05/13       BSA:          1.913 m Patient Age:    77 years       BP:           131/52 mmHg Patient Gender: M              HR:           59 bpm. Exam Location:  Inpatient Procedure: Limited Echo, Cardiac Doppler and Color Doppler Indications:    bacteremia  History:        Patient has prior history of Echocardiogram examinations. CHF,                 CAD, COPD, Arrythmias:Atrial Fibrillation; Risk                 Factors:Hypertension and Dyslipidemia.  Sonographer:    Phineas Douglas Referring Phys: [id number] [place]  1. Left ventricular ejection fraction, by estimation, is 20 to 25%. The left ventricle has severely decreased function. The left ventricle demonstrates global hypokinesis. The left ventricular internal cavity size was severely dilated.  2. Right ventricular systolic function is normal. The right ventricular size is normal. There is moderately elevated pulmonary artery systolic pressure.  3. The mitral valve is abnormal. Mild mitral valve regurgitation. No evidence of mitral stenosis.  4. The aortic valve is tricuspid. There is moderate calcification of the aortic valve. There is moderate thickening of the aortic valve. Aortic valve regurgitation is not visualized. Aortic valve sclerosis is present, with no evidence of aortic valve stenosis.  5. The  inferior vena cava is dilated in size with <50% respiratory variability, suggesting right atrial pressure of 15 mmHg. FINDINGS  Left Ventricle: Left ventricular ejection fraction, by estimation, is 20 to 25%. The left ventricle has severely decreased function. The  left ventricle demonstrates global hypokinesis. The left ventricular internal cavity size was severely dilated. There is no left ventricular hypertrophy. Right Ventricle: The right ventricular size is normal. No increase in right ventricular wall thickness. Right ventricular systolic function is normal. There is moderately elevated pulmonary artery systolic pressure. The tricuspid regurgitant velocity is 2.90 m/s, and with an assumed right atrial pressure of 15 mmHg, the estimated right ventricular systolic pressure is 16.0 mmHg. Left Atrium: Left atrial size was normal in size. Right Atrium: Right atrial size was normal in size. Pericardium: There is no evidence of pericardial effusion. Mitral Valve: The mitral valve is abnormal. There is mild thickening of the mitral valve leaflet(s). Mild mitral valve regurgitation. No evidence of mitral valve stenosis. Tricuspid Valve: The tricuspid valve is normal in structure. Tricuspid valve regurgitation is mild . No evidence of tricuspid stenosis. Aortic Valve: The aortic valve is tricuspid. There is moderate calcification of the aortic valve. There is moderate thickening of the aortic valve. Aortic valve regurgitation is not visualized. Aortic valve sclerosis is present, with no evidence of aortic valve stenosis. Pulmonic Valve: The pulmonic valve was normal in structure. Pulmonic valve regurgitation is mild. No evidence of pulmonic stenosis. Aorta: The aortic root is normal in size and structure. Venous: The inferior vena cava is dilated in size with less than 50% respiratory variability, suggesting right atrial pressure of 15 mmHg. IAS/Shunts: No atrial level shunt detected by color flow Doppler. LEFT VENTRICLE  PLAX 2D LVIDd:         6.60 cm LVIDs:         6.00 cm LV PW:         1.20 cm LV IVS:        0.80 cm LVOT diam:     1.90 cm LVOT Area:     2.84 cm  LV Volumes (MOD) LV vol d, MOD A2C: 228.0 ml LV vol d, MOD A4C: 231.0 ml LV vol s, MOD A2C: 163.0 ml LV vol s, MOD A4C: 173.0 ml LV SV MOD A2C:     65.0 ml LV SV MOD A4C:     231.0 ml LV SV MOD BP:      56.1 ml IVC IVC diam: 2.60 cm                       PULMONIC VALVE AORTA                 PR End Diast Vel: 7.08 msec Ao Root diam: 3.20 cm  TRICUSPID VALVE TR Peak grad:   33.6 mmHg TR Vmax:        290.00 cm/s  SHUNTS Systemic Diam: 1.90 cm Jenkins Rouge MD Electronically signed by Jenkins Rouge MD Signature Date/Time: 09/12/2022/10:59:26 AM    Final    [place] LT UPPER EXTREM LTD SOFT TISSUE NON VASCULAR  Result Date: 09/12/2022 CLINICAL DATA:  Swelling in the left antecubital fossa. EXAM: ULTRASOUND left UPPER EXTREMITY LIMITED TECHNIQUE: Ultrasound examination of the upper extremity soft tissues was performed in the area of clinical concern. COMPARISON:  None FINDINGS: Fluid/inflammation/edema in the subcutaneous tissues. No discrete fluid collection to suggest an abscess. Associated thrombophlebitis involving the basilic vein. IMPRESSION: 1. Fluid/inflammation/edema in the subcutaneous tissues. No discrete fluid collection to suggest an abscess. 2. Thrombophlebitis of the basilic vein. Electronically Signed   By: Marijo Sanes M.D.   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 Hemorrhagic transformation of right cerebellar infarct. Subsequent encounter. EXAM: CT HEAD WITHOUT CONTRAST TECHNIQUE: Contiguous axial images were obtained from the base of the skull through the vertex without intravenous contrast. RADIATION DOSE REDUCTION: This exam was performed according to the departmental dose-optimization program which  includes automated exposure control, adjustment of the mA and/or kV according to patient size and/or use of iterative reconstruction technique. COMPARISON:  Head CTs 09/11/2022 and earlier.  Brain MRI 09/06/2022. FINDINGS: Brain: Chronic supratentorial encephalomalacia, extensive on the right. No supratentorial mass effect or midline shift. Heterogeneous bilateral cerebellar and brainstem infarcts. Hemorrhagic transformation within the larger right cerebellar infarct is in the PICA territory and most resembles confluent petechial blood, series 3, image 7 - Heidelberg classification 1c: PH1, hematoma within infarcted tissue, occupying <30%, no substantive mass effect. This does not appear significantly changed since yesterday. Since 09/09/2012, mass effect on the 4th ventricle appears reduced and basilar cisterns remain normally patent. No ventriculomegaly. No new areas of infarction or bleeding are evident. Vascular: Extensive Calcified atherosclerosis at the skull base. Skull: Stable visualized osseous structures. Sinuses/Orbits: Right nasoenteric tube remains in place. Stable sinus, middle ear and mastoid aeration. Other: Stable orbit and scalp soft tissues. Some scalp vessel calcified atherosclerosis. IMPRESSION: 1. Stable post-cerebellar infarction petechial hemorrhage (Heidelberg classification 1c: PH1), most apparent in the right cerebellar hemisphere. No significant posterior fossa mass effect or other complicating features. 2. Recent bilateral cerebellar and brainstem infarcts with chronic supratentorial ischemia and no new intracranial abnormality identified. Electronically Signed   By: Genevie Ann M.D.   On: 09/12/2022 05:41   CT HEAD WO CONTRAST (5MM)  Result Date: 09/12/2022 CLINICAL DATA:  Follow-up examination for neuro deficit, stroke suspected. EXAM: CT HEAD WITHOUT CONTRAST TECHNIQUE: Contiguous axial images were obtained from the base of the skull through the vertex without intravenous contrast.  RADIATION DOSE REDUCTION: This exam was performed according to the departmental dose-optimization program which includes automated exposure control, adjustment of the mA and/or kV according to patient size and/or use of iterative reconstruction technique. COMPARISON:  Prior CT from 09/09/2022 as well as earlier studies. FINDINGS: Brain: There has been continued interval evolution of previously identified right larger than left cerebellar strokes. Minimal patchy involvement of the brainstem, better seen on prior MRI. Overall size and distribution is relatively stable. However, there has been interval hemorrhagic transformation at the right cerebellum, with a superimposed parenchymal bleed measuring approximately 1.6 x 1.6 x 1.3 cm now seen (series 5, image 8). Mass effect posterior fossa appears relatively similar. Fourth ventricle remains patent. Basilar cisterns remain patent. No hydrocephalus. No other new intracranial hemorrhage or large vessel territory infarct. Large remote right MCA distribution infarct again noted. Chronic left pontine lacunar infarct noted as well. No mass lesion or midline shift. No extra-axial collection. Vascular: No abnormal hyperdense vessel. Calcified atherosclerosis present at the skull base. Skull: Scalp soft tissues and calvarium demonstrate no new finding. Sinuses/Orbits: Globes orbital soft tissues within normal limits. Scattered mucosal thickening throughout the ethmoidal air cells. Nasogastric to mucosal thickening within the sphenoid ethmoidal sinuses. Nasogastric tube in place. No significant mastoid effusion. Other: None. IMPRESSION: 1. Continued interval evolution of previously identified right larger than left cerebellar strokes. However, there has been interval hemorrhagic transformation at the right cerebellum, with a superimposed 1.6 x 1.6 x 1.3 cm parenchymal bleed now seen. Mass effect posterior fossa appears relatively similar. No hydrocephalus. 2. No other new acute  intracranial abnormality. 3. Large remote right MCA distribution infarct, stable. These results were communicated to Dr. Cheral Marker at 2:14 am on 09/12/2022 by text page via the [facility] messaging system. Electronically Signed   By: Jeannine Boga M.D.   On: 09/12/2022 02:16   CT HIP LEFT W CONTRAST  Result Date: 09/11/2022 CLINICAL DATA:  Left hip pain. Known left hip fractures. Infection suspected EXAM: CT OF THE LOWER LEFT EXTREMITY WITH CONTRAST TECHNIQUE: Multidetector CT imaging of the lower left extremity was performed according to the standard protocol following intravenous contrast administration. RADIATION DOSE REDUCTION: This exam was performed according to the departmental dose-optimization program which includes automated exposure control, adjustment of the mA and/or kV according to patient size and/or use of iterative reconstruction technique. CONTRAST:  12m OMNIPAQUE IOHEXOL 350 MG/ML SOLN COMPARISON:  Hip CT 07/21/2022 FINDINGS: Bones/Joint/Cartilage Redemonstration of healing subacute nondisplaced fractures of the left inferior pubic ramus, left pubic root, and left acetabulum with patchy sclerosis along the fracture margins. No change in alignment. No new fractures. Hip joint alignment is maintained without dislocation. Moderate osteoarthritis of the left hip. No discernible left hip joint effusion. No lytic or sclerotic bone lesion. Ligaments Suboptimally assessed by CT. Muscles and Tendons No acute musculotendinous abnormality by CT. Soft tissues Extensive soft tissue edema and ill-defined fluid of the left hip and proximal left thigh, nonspecific. No organized or rim enhancing fluid collections. No soft tissue gas. Large right and small left hydroceles. No left inguinal lymphadenopathy. Prominent vascular calcification. IMPRESSION: 1. Redemonstration of healing subacute nondisplaced fractures of the left inferior pubic ramus, left pubic root, and left acetabulum. No change in alignment. No  new fractures. 2. Extensive soft tissue edema and ill-defined fluid of the left hip and proximal left thigh, nonspecific, but may represent cellulitis in the appropriate clinical setting. No organized or rim enhancing fluid collections. No soft tissue gas. 3. No left hip joint effusion is seen. 4. Large right and small left hydroceles. Electronically Signed   By: NDavina PokeD.O.   On: 09/11/2022 16:55   VAS [place]OWER EXTREMITY VENOUS (DVT)  Result Date: 09/11/2022  Lower Venous DVT Study Patient Name:  Vincent [name] Date of Exam:   09/11/2022 Medical Rec #: [id number]      Accession #:    [id number] of Birth: 708-16-1937       Patient Gender: M Patient Age:   89years [facility]      VAS [place] EXTREMITY VENOUS (DVT) Referring Phys: KHayden Pedro--------------------------------------------------------------------------------  Indications: Fever.  Anticoagulation: IV HEPARIN & prior to admission Eliquis. Limitations: Poor ultrasound/tissue interface. Comparison Study: previous exam 02/21/21 was negative for DVT Performing Technologist: JRogelia RohrerRVT, RDMS  Examination Guidelines: A complete evaluation includes B-mode imaging, spectral Doppler, color Doppler, and power Doppler as needed of all accessible portions of each vessel. Bilateral testing is considered an integral part of a complete examination. Limited examinations for reoccurring indications may be performed as noted. The reflux portion of the exam is performed with the patient in reverse Trendelenburg.  +---------+---------------+---------+-----------+----------+--------------+ RIGHT    CompressibilityPhasicitySpontaneityPropertiesThrombus Aging +---------+---------------+---------+-----------+----------+--------------+ CFV      Full           Yes      Yes                                 +---------+---------------+---------+-----------+----------+--------------+ SFJ  Full                                                         +---------+---------------+---------+-----------+----------+--------------+ FV Prox  Full           Yes      Yes                                 +---------+---------------+---------+-----------+----------+--------------+ FV Mid   Full           Yes      Yes                                 +---------+---------------+---------+-----------+----------+--------------+ FV DistalFull           Yes      Yes                                 +---------+---------------+---------+-----------+----------+--------------+ PFV      Full                                                        +---------+---------------+---------+-----------+----------+--------------+ POP      Full           Yes      Yes                                 +---------+---------------+---------+-----------+----------+--------------+ PTV      Full                                                        +---------+---------------+---------+-----------+----------+--------------+ PERO     Full                                                        +---------+---------------+---------+-----------+----------+--------------+ Difficulty compressing area of CFV / SFJ due to bandage placement from IR procedure - patent by color/doppler. Difficulty visualizing calf veins due to calcific shadowing.  +---------+---------------+---------+-----------+----------+--------------+ LEFT     CompressibilityPhasicitySpontaneityPropertiesThrombus Aging +---------+---------------+---------+-----------+----------+--------------+ CFV      Full           Yes      Yes                                 +---------+---------------+---------+-----------+----------+--------------+ SFJ      Full                                                        +---------+---------------+---------+-----------+----------+--------------+  FV Prox  Full           Yes      Yes                                  +---------+---------------+---------+-----------+----------+--------------+ FV Mid   Full           Yes      Yes                                 +---------+---------------+---------+-----------+----------+--------------+ FV DistalFull           Yes      Yes                                 +---------+---------------+---------+-----------+----------+--------------+ PFV      Full                                                        +---------+---------------+---------+-----------+----------+--------------+ POP      Full           Yes      Yes                                 +---------+---------------+---------+-----------+----------+--------------+ PTV      Full                                                        +---------+---------------+---------+-----------+----------+--------------+ PERO     Full                                                        +---------+---------------+---------+-----------+----------+--------------+ Difficulty visualizing calf veins due to calcific shadowing.    Summary: BILATERAL: - No evidence of deep vein thrombosis seen in the lower extremities, bilaterally. -No evidence of popliteal cyst, bilaterally.   *See table(s) above for measurements and observations. Electronically signed by Monica Martinez MD on 09/11/2022 at 1:51:11 PM.    Final    DG CHEST PORT 1 VIEW  Result Date: 09/10/2022 CLINICAL DATA:  Ventilator dependent EXAM: PORTABLE CHEST 1 VIEW COMPARISON:  Prior chest x-ray 08/29/2022 FINDINGS: Patient remains intubated. The tip of the endotracheal tube is 7.7 cm above the carina. A feeding tube is present. The tip lies off the field of view, presumably in the stomach. Significant enlargement of the cardiopericardial silhouette as seen before. Improved aeration. Lungs are clear. Perhaps trace small pleural effusions. No pulmonary edema. No pneumothorax. No acute osseous abnormality. IMPRESSION: 1. Overall  improved aeration and inspiratory volumes. 2. Stable and satisfactory support apparatus. 3. Persistent cardiomegaly. 4. Probable trace bilateral pleural effusions. Electronically Signed   By: Jacqulynn Cadet M.D.   On: 09/10/2022 15:08    Review of Systems  Unable to perform  ROS: Intubated   Blood pressure (!) 132/54, pulse 64, temperature 99.3 F (37.4 C), temperature source Oral, resp. rate 20, height '5\' 9"'$  (1.753 m), weight 75.7 kg, SpO2 100 %. Physical Exam Constitutional:      General: He is not in acute distress.    Appearance: He is well-developed. He is not diaphoretic.  HENT:     Head: Normocephalic and atraumatic.  Eyes:     General: No scleral icterus.       Right eye: No discharge.        Left eye: No discharge.     Conjunctiva/sclera: Conjunctivae normal.  Cardiovascular:     Rate and Rhythm: Normal rate and regular rhythm.  Pulmonary:     Effort: Pulmonary effort is normal. No respiratory distress.  Musculoskeletal:     Cervical back: Normal range of motion.     Comments: LLE No traumatic wounds, ecchymosis, or rash  Nontender, no pain with hip PROM  No knee or ankle effusion  Knee stable to varus/ valgus and anterior/posterior stress  Sens DPN, SPN, TN could not assess  Motor EHL, ext, flex, evers could not assess  DP 1+, PT 2+, 2+ edema  Skin:    General: Skin is warm and dry.  Neurological:     Mental Status: He is alert.     Assessment/Plan: Left hip edema -- No e/o of septic joint and hip not painful when ranged. I'm unsure of significance of edema but no surgical indication here. He may be WBAT LLE.    Lisette Abu, PA-C Orthopedic Surgery [phone] 09/12/2022, 11:25 AM

## 2022-09-12 NOTE — Progress Notes (Signed)
Upper extremity venous left study completed.  Preliminary results relayed to Chrys Racer, RN.   See CV Proc for preliminary results report.   Darlin Coco, RDMS, RVT

## 2022-09-12 NOTE — Progress Notes (Signed)
[facility]  Date of Admission:  09/27/2022     Total days of antibiotics 3         ASSESSMENT:  Vincent [name] blood culture from 09/11/22 is without growth in <24 hours and fever curve appears to be down trending. TTE with no evidence of vegetation and EF of 20-25% and severely decreased left ventricular function. Continues to require ventilatory support with challenges with weaning. Started on hypertonic saline to help reduce cytotoxic edema. Source of infection remains unclear whether possible endocarditis or even from peripheral site/IV. Orthopedics evaluated left hip with no evidence of septic arthritis.  Continue current dose of Cefazolin. Monitor cultures for clearance of bacteremia. Remaining medical and supportive care per [facility].  PLAN:  Continue current dose of Cefazolin. Monitor cultures for clearance of bacteremia.  Consider possible TEE to check for endocarditis.  Remaining medical and supportive care per PCCM.   Principal Problem:   Status post surgery Active Problems:   Staphylococcus aureus bacteremia   Stroke (cerebrum) (HCC)   Basilar artery occlusion   Chronic respiratory failure with hypoxia (HCC)   Basilar artery obstruction   Malnutrition of moderate degree   Encephalopathy acute   Ventilator dependence (HCC)    aspirin  325 mg Per Tube Daily   atorvastatin  80 mg Per Tube Daily   brimonidine  1 drop Both Eyes BID   carvedilol  3.125 mg Per Tube BID   Chlorhexidine Gluconate Cloth  6 each Topical Q0600   docusate  100 mg Per Tube BID   feeding supplement (PROSource TF20)  60 mL Per Tube BID   furosemide  40 mg Intravenous Daily   insulin aspart  0-15 Units Subcutaneous Q4H   insulin aspart  4 Units Subcutaneous Q4H   mouth rinse  15 mL Mouth Rinse Q2H   pantoprazole (PROTONIX) IV  40 mg Intravenous Q24H   polyethylene glycol  17 g Per Tube Daily   sacubitril-valsartan  1 tablet Per Tube BID   spironolactone   25 mg Per Tube Daily    SUBJECTIVE:  Afebrile overnight with no acute events. Remains intubated and on Precedex.   No Known Allergies   Review of Systems: Review of Systems  Unable to perform ROS: Intubated      OBJECTIVE: Vitals:   09/12/22 1100 09/12/22 1200 09/12/22 1300 09/12/22 1400  BP: (!) 111/53 (!) 125/51 111/62 117/69  Pulse: 66 78 79 76  Resp: 20 (!) 23 (!) 21 20  Temp:  98.7 F (37.1 C)    TempSrc:  Oral    SpO2: 100% 100% 100% 100%  Weight:      Height:       Body mass index is 24.65 kg/m.  Physical Exam Constitutional:      General: He is not in acute distress.    Appearance: He is well-developed. He is ill-appearing.     Interventions: He is intubated.  Cardiovascular:     Rate and Rhythm: Normal rate and regular rhythm.     Heart sounds: Normal heart sounds.  Pulmonary:     Effort: Pulmonary effort is normal. He is intubated.     Breath sounds: Normal breath sounds.  Skin:    General: Skin is warm and dry.     Lab Results Lab Results  Component Value Date   WBC 7.3 09/12/2022   HGB 9.2 (L) 09/12/2022   HCT 30.1 (L) 09/12/2022   MCV 87.0 09/12/2022   PLT  122 (L) 09/12/2022    Lab Results  Component Value Date   CREATININE 1.01 09/12/2022   BUN 25 (H) 09/12/2022   NA 144 09/12/2022   K 3.6 09/12/2022   CL 107 09/12/2022   CO2 26 09/12/2022    Lab Results  Component Value Date   ALT 30 09/07/2022   AST 33 09/07/2022   ALKPHOS 110 09/07/2022   BILITOT 2.1 (H) 09/07/2022     Microbiology: Recent Results (from the past 240 hour(s))  Resp panel by RT-PCR (RSV, Flu A&B, Covid) Anterior Nasal Swab     Status: None   Collection Time: 09/14/2022  3:50 PM   Specimen: Anterior Nasal Swab  Result Value Ref Range Status   SARS Coronavirus 2 by RT PCR NEGATIVE NEGATIVE Final    Comment: (NOTE) SARS-CoV-2 target nucleic acids are NOT DETECTED.  The SARS-CoV-2 RNA is generally detectable in upper respiratory specimens during the acute  phase of infection. The lowest concentration of SARS-CoV-2 viral copies this assay can detect is 138 copies/mL. A negative result does not preclude SARS-Cov-2 infection and should not be used as the sole basis for treatment or other patient management decisions. A negative result may occur with  improper specimen collection/handling, submission of specimen other than nasopharyngeal swab, presence of viral mutation(s) within the areas targeted by this assay, and inadequate number of viral copies(<138 copies/mL). A negative result must be combined with clinical observations, patient history, and epidemiological information. The expected result is Negative.  Fact Sheet for Patients:  EntrepreneurPulse.com.au  Fact Sheet for Healthcare Providers:  IncredibleEmployment.be  This test is no t yet approved or cleared by the [place] FDA and  has been authorized for detection and/or diagnosis of SARS-CoV-2 by FDA under an Emergency Use Authorization (EUA). This EUA will remain  in effect (meaning this test can be used) for the duration of the COVID-19 declaration under Section 564(b)(1) of the Act, 21 U.S.C.section 360bbb-3(b)(1), unless the authorization is terminated  or revoked sooner.       Influenza A by PCR NEGATIVE NEGATIVE Final   Influenza B by PCR NEGATIVE NEGATIVE Final    Comment: (NOTE) The Xpert Xpress SARS-CoV-2/FLU/RSV plus assay is intended as an aid in the diagnosis of influenza from Nasopharyngeal swab specimens and should not be used as a sole basis for treatment. Nasal washings and aspirates are unacceptable for Xpert Xpress SARS-CoV-2/FLU/RSV testing.  Fact Sheet for Patients: EntrepreneurPulse.com.au  Fact Sheet for Healthcare Providers: IncredibleEmployment.be  This test is not yet approved or cleared by the [place] FDA and has been authorized for detection and/or diagnosis of  SARS-CoV-2 by FDA under an Emergency Use Authorization (EUA). This EUA will remain in effect (meaning this test can be used) for the duration of the COVID-19 declaration under Section 564(b)(1) of the Act, 21 U.S.C. section 360bbb-3(b)(1), unless the authorization is terminated or revoked.     Resp Syncytial Virus by PCR NEGATIVE NEGATIVE Final    Comment: (NOTE) Fact Sheet for Patients: EntrepreneurPulse.com.au  Fact Sheet for Healthcare Providers: IncredibleEmployment.be  This test is not yet approved or cleared by the [place] FDA and has been authorized for detection and/or diagnosis of SARS-CoV-2 by FDA under an Emergency Use Authorization (EUA). This EUA will remain in effect (meaning this test can be used) for the duration of the COVID-19 declaration under Section 564(b)(1) of the Act, 21 U.S.C. section 360bbb-3(b)(1), unless the authorization is terminated or revoked.  Performed at [facility], [place]  [address] [place], [place] [address]   MRSA Next Gen by PCR, Nasal     Status: None   Collection Time: 09/06/22  1:43 AM   Specimen: Nasal Mucosa; Nasal Swab  Result Value Ref Range Status   MRSA by PCR Next Gen NOT DETECTED NOT DETECTED Final    Comment: (NOTE) The GeneXpert MRSA Assay (FDA approved for NASAL specimens only), is one component of a comprehensive MRSA colonization surveillance program. It is not intended to diagnose MRSA infection nor to guide or monitor treatment for MRSA infections. Test performance is not FDA approved in patients less than 84 years old. Performed at [facility], [place] [address] [place], [place] [address]   Culture, Respiratory w Gram Stain     Status: None   Collection Time: 09/08/22 11:02 AM   Specimen: Tracheal Aspirate; Respiratory  Result Value Ref Range Status   Specimen Description TRACHEAL ASPIRATE  Final   Special Requests NONE  Final   Gram Stain   Final    NO WBC  SEEN FEW GRAM POSITIVE COCCI IN CHAINS FEW GRAM NEGATIVE RODS RARE GRAM POSITIVE RODS    Culture   Final    ABUNDANT Normal respiratory flora-no Staph aureus or Pseudomonas seen Performed at [facility], [address], [place], [place] [address]    Report Status 09/10/2022 FINAL  Final  Culture, Respiratory w Gram Stain     Status: None (Preliminary result)   Collection Time: 09/10/22  5:48 PM   Specimen: Tracheal Aspirate; Respiratory  Result Value Ref Range Status   Specimen Description TRACHEAL ASPIRATE  Final   Special Requests NONE  Final   Gram Stain   Final    ABUNDANT WBC PRESENT, PREDOMINANTLY PMN FEW GRAM POSITIVE RODS RARE GRAM POSITIVE COCCI IN SINGLES IN PAIRS FEW GRAM NEGATIVE RODS Performed at [facility], [place] [address] [place], [place] [address]    Culture FEW ENTEROBACTER GERGOVIAE  Final   Report Status PENDING  Incomplete   Organism ID, Bacteria ENTEROBACTER GERGOVIAE  Final      Susceptibility   Enterobacter gergoviae - MIC*    CEFAZOLIN <=4 SENSITIVE Sensitive     CEFEPIME <=0.12 SENSITIVE Sensitive     CEFTAZIDIME <=1 SENSITIVE Sensitive     CEFTRIAXONE <=0.25 SENSITIVE Sensitive     CIPROFLOXACIN <=0.25 SENSITIVE Sensitive     GENTAMICIN <=1 SENSITIVE Sensitive     IMIPENEM <=0.25 SENSITIVE Sensitive     TRIMETH/SULFA <=20 SENSITIVE Sensitive     PIP/TAZO <=4 SENSITIVE Sensitive     * FEW ENTEROBACTER GERGOVIAE  Culture, blood (Routine X 2) w Reflex to ID Panel     Status: Abnormal (Preliminary result)   Collection Time: 09/10/22  6:06 PM   Specimen: BLOOD  Result Value Ref Range Status   Specimen Description BLOOD SITE NOT SPECIFIED  Final   Special Requests   Final    BOTTLES DRAWN AEROBIC AND ANAEROBIC Blood Culture adequate volume   Culture  Setup Time   Final    GRAM POSITIVE COCCI IN CLUSTERS IN BOTH AEROBIC AND ANAEROBIC BOTTLES CRITICAL RESULT CALLED TO, READ BACK BY AND VERIFIED WITH: PHARMD L.BELL AT 1206 ON 09/11/2022  BY T.SAAD.    Culture (A)  Final    STAPHYLOCOCCUS AUREUS SUSCEPTIBILITIES TO FOLLOW Performed at [facility], [place] [address] [place], [place] [address]    Report Status PENDING  Incomplete  Culture, blood (Routine X 2) w Reflex to ID Panel  Status: Abnormal (Preliminary result)   Collection Time: 09/10/22  6:06 PM   Specimen: BLOOD  Result Value Ref Range Status   Specimen Description BLOOD SITE NOT SPECIFIED  Final   Special Requests   Final    BOTTLES DRAWN AEROBIC AND ANAEROBIC Blood Culture adequate volume   Culture  Setup Time   Final    GRAM POSITIVE COCCI IN CLUSTERS IN BOTH AEROBIC AND ANAEROBIC BOTTLES CRITICAL VALUE NOTED.  VALUE IS CONSISTENT WITH PREVIOUSLY REPORTED AND CALLED VALUE. Performed at [facility], [place] [address], [place], [place] [address]    Culture STAPHYLOCOCCUS AUREUS (A)  Final   Report Status PENDING  Incomplete  Blood Culture ID Panel (Reflexed)     Status: Abnormal   Collection Time: 09/10/22  6:06 PM  Result Value Ref Range Status   Enterococcus faecalis NOT DETECTED NOT DETECTED Final   Enterococcus Faecium NOT DETECTED NOT DETECTED Final   Listeria monocytogenes NOT DETECTED NOT DETECTED Final   Staphylococcus species DETECTED (A) NOT DETECTED Final    Comment: CRITICAL RESULT CALLED TO, READ BACK BY AND VERIFIED WITH: PHARMD L.BELL AT 1206 ON 09/11/2022 BY T.SAAD.    Staphylococcus aureus (BCID) DETECTED (A) NOT DETECTED Final    Comment: CRITICAL RESULT CALLED TO, READ BACK BY AND VERIFIED WITH: PHARMD L.BELL AT 1206 ON 09/11/2022 BY T.SAAD.    Staphylococcus epidermidis NOT DETECTED NOT DETECTED Final   Staphylococcus lugdunensis NOT DETECTED NOT DETECTED Final   Streptococcus species NOT DETECTED NOT DETECTED Final   Streptococcus agalactiae NOT DETECTED NOT DETECTED Final   Streptococcus pneumoniae NOT DETECTED NOT DETECTED Final   Streptococcus pyogenes NOT DETECTED NOT DETECTED Final   A.calcoaceticus-baumannii  NOT DETECTED NOT DETECTED Final   Bacteroides fragilis NOT DETECTED NOT DETECTED Final   Enterobacterales NOT DETECTED NOT DETECTED Final   Enterobacter cloacae complex NOT DETECTED NOT DETECTED Final   Escherichia coli NOT DETECTED NOT DETECTED Final   Klebsiella aerogenes NOT DETECTED NOT DETECTED Final   Klebsiella oxytoca NOT DETECTED NOT DETECTED Final   Klebsiella pneumoniae NOT DETECTED NOT DETECTED Final   Proteus species NOT DETECTED NOT DETECTED Final   Salmonella species NOT DETECTED NOT DETECTED Final   Serratia marcescens NOT DETECTED NOT DETECTED Final   Haemophilus influenzae NOT DETECTED NOT DETECTED Final   Neisseria meningitidis NOT DETECTED NOT DETECTED Final   Pseudomonas aeruginosa NOT DETECTED NOT DETECTED Final   Stenotrophomonas maltophilia NOT DETECTED NOT DETECTED Final   Candida albicans NOT DETECTED NOT DETECTED Final   Candida auris NOT DETECTED NOT DETECTED Final   Candida glabrata NOT DETECTED NOT DETECTED Final   Candida krusei NOT DETECTED NOT DETECTED Final   Candida parapsilosis NOT DETECTED NOT DETECTED Final   Candida tropicalis NOT DETECTED NOT DETECTED Final   Cryptococcus neoformans/gattii NOT DETECTED NOT DETECTED Final   Meth resistant mecA/C and MREJ NOT DETECTED NOT DETECTED Final    Comment: Performed at [facility], [address] [place], [place] [address]  Respiratory (~20 pathogens) panel by PCR     Status: None   Collection Time: 09/11/22  1:06 PM   Specimen: Nasopharyngeal Swab; Respiratory  Result Value Ref Range Status   Adenovirus NOT DETECTED NOT DETECTED Final   Coronavirus 229E NOT DETECTED NOT DETECTED Final    Comment: (NOTE) The Coronavirus on the Respiratory Panel, DOES NOT test for the novel  Coronavirus (2019 nCoV)    Coronavirus HKU1 NOT DETECTED NOT DETECTED Final   Coronavirus NL63  NOT DETECTED NOT DETECTED Final   Coronavirus OC43 NOT DETECTED NOT DETECTED Final   Metapneumovirus NOT DETECTED NOT DETECTED  Final   Rhinovirus / Enterovirus NOT DETECTED NOT DETECTED Final   Influenza A NOT DETECTED NOT DETECTED Final   Influenza B NOT DETECTED NOT DETECTED Final   Parainfluenza Virus 1 NOT DETECTED NOT DETECTED Final   Parainfluenza Virus 2 NOT DETECTED NOT DETECTED Final   Parainfluenza Virus 3 NOT DETECTED NOT DETECTED Final   Parainfluenza Virus 4 NOT DETECTED NOT DETECTED Final   Respiratory Syncytial Virus NOT DETECTED NOT DETECTED Final   Bordetella pertussis NOT DETECTED NOT DETECTED Final   Bordetella Parapertussis NOT DETECTED NOT DETECTED Final   Chlamydophila pneumoniae NOT DETECTED NOT DETECTED Final   Mycoplasma pneumoniae NOT DETECTED NOT DETECTED Final    Comment: Performed at [facility], [place] [address], [place], [place] [address]  Culture, blood (Routine X 2) w Reflex to ID Panel     Status: None (Preliminary result)   Collection Time: 09/11/22  5:46 PM   Specimen: BLOOD  Result Value Ref Range Status   Specimen Description BLOOD SITE NOT SPECIFIED  Final   Special Requests   Final    BOTTLES DRAWN AEROBIC ONLY Blood Culture results may not be optimal due to an inadequate volume of blood received in culture bottles   Culture   Final    NO GROWTH < 24 HOURS Performed at [facility], [place] [address], [place], [place] [address]    Report Status PENDING  Incomplete     Terri Piedra, NP [facility]  09/12/2022  2:41 PM

## 2022-09-12 NOTE — Progress Notes (Signed)
  Echocardiogram 2D Echocardiogram has been performed.  Frances Furbish 09/12/2022, 10:56 AM

## 2022-09-12 NOTE — Progress Notes (Signed)
Pt transported to CT and back without complications. 

## 2022-09-12 NOTE — Progress Notes (Signed)
OT Cancellation Note  Patient Details Name: Vincent [name] MRN: [id number] DOB: 05-01-1936   Cancelled Treatment:    Reason Eval/Treat Not Completed: Medical issues which prohibited therapy  Pt with new small focus of hemorrhagic conversion within infarcted tissue of the right cerebellum.  Per nursing recommendation, will hold OT eval today and check back tomorrow for appropriateness.   Anaiya Wisinski OTR/L 09/12/2022, 9:49 AM

## 2022-09-12 NOTE — Progress Notes (Signed)
STROKE TEAM PROGRESS NOTE   INTERVAL HISTORY Patient is afebrile today but 3 out of 4 blood cultures are positive for MSSA and he has been started on cefazolin.  Respiratory viral panel is negative.  Continues to have depressed mental status and had CT scan done yesterday which suggested mild hemorrhagic transformation of the right cerebellar infarct with 1.6 x 1.6 x 1.3 cm parenchymal bleed.  IV heparin was stopped.  There is mild mass effect on posterior fossa. Repeat CT head this morning shows stable petechial hemorrhage in the right cerebral hemisphere.  No significant posterior fossa mass effect. Pt still intubated and on vent for respiratory failure, becomes tachypneic during weans.  No family at the bedside.  Following commands well and moving all 4 extremities purposefully though with mild right hemiparesis nods head to voice,     Vital signs stable.  He is still requiring ventilatory support for respiratory failure and CCM trying to wean as tolerated  Vitals:   09/12/22 1000 09/12/22 1055 09/12/22 1100 09/12/22 1200  BP: (!) 132/54  (!) 111/53 (!) 125/51  Pulse: 64 69 66 78  Resp: '20 20 20 '$ (!) 23  Temp:    98.7 F (37.1 C)  TempSrc:    Oral  SpO2: 100% 100% 100% 100%  Weight:      Height:       CBC:  Recent Labs  Lab 09/20/2022 1520 09/07/2022 1547 09/06/22 0926 09/07/22 0232 09/11/22 0352 09/12/22 0027  WBC 3.8*  --  10.8*   < > 8.9 7.3  NEUTROABS 2.9  --  9.8*  --   --   --   HGB 11.1*   < > 9.5*   < > 9.7* 9.2*  HCT 36.7*   < > 30.3*   < > 32.6* 30.1*  MCV 88.2  --  85.8   < > 88.1 87.0  PLT 149*  --  140*   < > 122* 122*   < > = values in this interval not displayed.   Basic Metabolic Panel:  Recent Labs  Lab 09/11/22 0352 09/12/22 0027 09/12/22 1012  NA 144 143 144  K 3.6 3.6  --   CL 107 107  --   CO2 29 26  --   GLUCOSE 214* 208*  --   BUN 24* 25*  --   CREATININE 0.96 1.01  --   CALCIUM 8.0* 7.8*  --   MG 2.0 2.0  --   PHOS 2.1* 2.6  --    Lipid  Panel:  Recent Labs  Lab 09/06/22 0211 09/07/22 0232  CHOL 129  --   TRIG 75 81  HDL 58  --   CHOLHDL 2.2  --   VLDL 15  --   LDLCALC 56  --    HgbA1c:  Recent Labs  Lab 09/06/22 0211  HGBA1C 6.5*   Urine Drug Screen: No results for input(s): "LABOPIA", "COCAINSCRNUR", "LABBENZ", "AMPHETMU", "THCU", "LABBARB" in the last 168 hours.  Alcohol Level No results for input(s): "ETH" in the last 168 hours.  IMAGING past 24 hours ECHOCARDIOGRAM LIMITED  Result Date: 09/12/2022    ECHOCARDIOGRAM LIMITED REPORT   Patient Name:   Vincent [name] Date of Exam: 09/12/2022 Medical Rec #:  [id number]      Height:       69.0 in Accession #:    [id number]     Weight:       166.9 lb Date of Birth:  06-04-1936  BSA:          1.913 m Patient Age:    87 years       BP:           131/52 mmHg Patient Gender: M              HR:           59 bpm. Exam Location:  Inpatient Procedure: Limited Echo, Cardiac Doppler and Color Doppler Indications:    bacteremia  History:        Patient has prior history of Echocardiogram examinations. CHF,                 CAD, COPD, Arrythmias:Atrial Fibrillation; Risk                 Factors:Hypertension and Dyslipidemia.  Sonographer:    Phineas Douglas Referring Phys: [id number] [place]  1. Left ventricular ejection fraction, by estimation, is 20 to 25%. The left ventricle has severely decreased function. The left ventricle demonstrates global hypokinesis. The left ventricular internal cavity size was severely dilated.  2. Right ventricular systolic function is normal. The right ventricular size is normal. There is moderately elevated pulmonary artery systolic pressure.  3. The mitral valve is abnormal. Mild mitral valve regurgitation. No evidence of mitral stenosis.  4. The aortic valve is tricuspid. There is moderate calcification of the aortic valve. There is moderate thickening of the aortic valve. Aortic valve regurgitation is not visualized. Aortic valve  sclerosis is present, with no evidence of aortic valve stenosis.  5. The inferior vena cava is dilated in size with <50% respiratory variability, suggesting right atrial pressure of 15 mmHg. FINDINGS  Left Ventricle: Left ventricular ejection fraction, by estimation, is 20 to 25%. The left ventricle has severely decreased function. The left ventricle demonstrates global hypokinesis. The left ventricular internal cavity size was severely dilated. There is no left ventricular hypertrophy. Right Ventricle: The right ventricular size is normal. No increase in right ventricular wall thickness. Right ventricular systolic function is normal. There is moderately elevated pulmonary artery systolic pressure. The tricuspid regurgitant velocity is 2.90 m/s, and with an assumed right atrial pressure of 15 mmHg, the estimated right ventricular systolic pressure is 01.0 mmHg. Left Atrium: Left atrial size was normal in size. Right Atrium: Right atrial size was normal in size. Pericardium: There is no evidence of pericardial effusion. Mitral Valve: The mitral valve is abnormal. There is mild thickening of the mitral valve leaflet(s). Mild mitral valve regurgitation. No evidence of mitral valve stenosis. Tricuspid Valve: The tricuspid valve is normal in structure. Tricuspid valve regurgitation is mild . No evidence of tricuspid stenosis. Aortic Valve: The aortic valve is tricuspid. There is moderate calcification of the aortic valve. There is moderate thickening of the aortic valve. Aortic valve regurgitation is not visualized. Aortic valve sclerosis is present, with no evidence of aortic valve stenosis. Pulmonic Valve: The pulmonic valve was normal in structure. Pulmonic valve regurgitation is mild. No evidence of pulmonic stenosis. Aorta: The aortic root is normal in size and structure. Venous: The inferior vena cava is dilated in size with less than 50% respiratory variability, suggesting right atrial pressure of 15 mmHg.  IAS/Shunts: No atrial level shunt detected by color flow Doppler. LEFT VENTRICLE PLAX 2D LVIDd:         6.60 cm LVIDs:         6.00 cm LV PW:         1.20 cm  LV IVS:        0.80 cm LVOT diam:     1.90 cm LVOT Area:     2.84 cm  LV Volumes (MOD) LV vol d, MOD A2C: 228.0 ml LV vol d, MOD A4C: 231.0 ml LV vol s, MOD A2C: 163.0 ml LV vol s, MOD A4C: 173.0 ml LV SV MOD A2C:     65.0 ml LV SV MOD A4C:     231.0 ml LV SV MOD BP:      56.1 ml IVC IVC diam: 2.60 cm                       PULMONIC VALVE AORTA                 PR End Diast Vel: 7.08 msec Ao Root diam: 3.20 cm  TRICUSPID VALVE TR Peak grad:   33.6 mmHg TR Vmax:        290.00 cm/s  SHUNTS Systemic Diam: 1.90 cm Jenkins Rouge MD Electronically signed by Jenkins Rouge MD Signature Date/Time: 09/12/2022/10:59:26 AM    Final    [place] LT UPPER EXTREM LTD SOFT TISSUE NON VASCULAR  Result Date: 09/12/2022 CLINICAL DATA:  Swelling in the left antecubital fossa. EXAM: ULTRASOUND left UPPER EXTREMITY LIMITED TECHNIQUE: Ultrasound examination of the upper extremity soft tissues was performed in the area of clinical concern. COMPARISON:  None FINDINGS: Fluid/inflammation/edema in the subcutaneous tissues. No discrete fluid collection to suggest an abscess. Associated thrombophlebitis involving the basilic vein. IMPRESSION: 1. Fluid/inflammation/edema in the subcutaneous tissues. No discrete fluid collection to suggest an abscess. 2. Thrombophlebitis of the basilic vein. Electronically Signed   By: Marijo Sanes M.D.   On: 09/12/2022 06:25   CT HEAD WO CONTRAST (5MM)  Result Date: 09/12/2022 CLINICAL DATA:  87 year old male status post thrombectomy of the basilar artery with cerebellar and brainstem infarcts. Hemorrhagic transformation of right cerebellar infarct. Subsequent encounter. EXAM: CT HEAD WITHOUT CONTRAST TECHNIQUE: Contiguous axial images were obtained from the base of the skull through the vertex without intravenous contrast. RADIATION DOSE REDUCTION: This exam  was performed according to the departmental dose-optimization program which includes automated exposure control, adjustment of the mA and/or kV according to patient size and/or use of iterative reconstruction technique. COMPARISON:  Head CTs 09/11/2022 and earlier.  Brain MRI 09/06/2022. FINDINGS: Brain: Chronic supratentorial encephalomalacia, extensive on the right. No supratentorial mass effect or midline shift. Heterogeneous bilateral cerebellar and brainstem infarcts. Hemorrhagic transformation within the larger right cerebellar infarct is in the PICA territory and most resembles confluent petechial blood, series 3, image 7 - Heidelberg classification 1c: PH1, hematoma within infarcted tissue, occupying <30%, no substantive mass effect. This does not appear significantly changed since yesterday. Since 09/09/2012, mass effect on the 4th ventricle appears reduced and basilar cisterns remain normally patent. No ventriculomegaly. No new areas of infarction or bleeding are evident. Vascular: Extensive Calcified atherosclerosis at the skull base. Skull: Stable visualized osseous structures. Sinuses/Orbits: Right nasoenteric tube remains in place. Stable sinus, middle ear and mastoid aeration. Other: Stable orbit and scalp soft tissues. Some scalp vessel calcified atherosclerosis. IMPRESSION: 1. Stable post-cerebellar infarction petechial hemorrhage (Heidelberg classification 1c: PH1), most apparent in the right cerebellar hemisphere. No significant posterior fossa mass effect or other complicating features. 2. Recent bilateral cerebellar and brainstem infarcts with chronic supratentorial ischemia and no new intracranial abnormality identified. Electronically Signed   By: Genevie Ann M.D.   On: 09/12/2022 05:41   CT HEAD WO  CONTRAST (5MM)  Result Date: 09/12/2022 CLINICAL DATA:  Follow-up examination for neuro deficit, stroke suspected. EXAM: CT HEAD WITHOUT CONTRAST TECHNIQUE: Contiguous axial images were obtained  from the base of the skull through the vertex without intravenous contrast. RADIATION DOSE REDUCTION: This exam was performed according to the departmental dose-optimization program which includes automated exposure control, adjustment of the mA and/or kV according to patient size and/or use of iterative reconstruction technique. COMPARISON:  Prior CT from 09/09/2022 as well as earlier studies. FINDINGS: Brain: There has been continued interval evolution of previously identified right larger than left cerebellar strokes. Minimal patchy involvement of the brainstem, better seen on prior MRI. Overall size and distribution is relatively stable. However, there has been interval hemorrhagic transformation at the right cerebellum, with a superimposed parenchymal bleed measuring approximately 1.6 x 1.6 x 1.3 cm now seen (series 5, image 8). Mass effect posterior fossa appears relatively similar. Fourth ventricle remains patent. Basilar cisterns remain patent. No hydrocephalus. No other new intracranial hemorrhage or large vessel territory infarct. Large remote right MCA distribution infarct again noted. Chronic left pontine lacunar infarct noted as well. No mass lesion or midline shift. No extra-axial collection. Vascular: No abnormal hyperdense vessel. Calcified atherosclerosis present at the skull base. Skull: Scalp soft tissues and calvarium demonstrate no new finding. Sinuses/Orbits: Globes orbital soft tissues within normal limits. Scattered mucosal thickening throughout the ethmoidal air cells. Nasogastric to mucosal thickening within the sphenoid ethmoidal sinuses. Nasogastric tube in place. No significant mastoid effusion. Other: None. IMPRESSION: 1. Continued interval evolution of previously identified right larger than left cerebellar strokes. However, there has been interval hemorrhagic transformation at the right cerebellum, with a superimposed 1.6 x 1.6 x 1.3 cm parenchymal bleed now seen. Mass effect  posterior fossa appears relatively similar. No hydrocephalus. 2. No other new acute intracranial abnormality. 3. Large remote right MCA distribution infarct, stable. These results were communicated to Dr. Cheral Marker at 2:14 am on 09/12/2022 by text page via the [facility] messaging system. Electronically Signed   By: Jeannine Boga M.D.   On: 09/12/2022 02:16   CT HIP LEFT W CONTRAST  Result Date: 09/11/2022 CLINICAL DATA:  Left hip pain. Known left hip fractures. Infection suspected EXAM: CT OF THE LOWER LEFT EXTREMITY WITH CONTRAST TECHNIQUE: Multidetector CT imaging of the lower left extremity was performed according to the standard protocol following intravenous contrast administration. RADIATION DOSE REDUCTION: This exam was performed according to the departmental dose-optimization program which includes automated exposure control, adjustment of the mA and/or kV according to patient size and/or use of iterative reconstruction technique. CONTRAST:  65m OMNIPAQUE IOHEXOL 350 MG/ML SOLN COMPARISON:  Hip CT 07/21/2022 FINDINGS: Bones/Joint/Cartilage Redemonstration of healing subacute nondisplaced fractures of the left inferior pubic ramus, left pubic root, and left acetabulum with patchy sclerosis along the fracture margins. No change in alignment. No new fractures. Hip joint alignment is maintained without dislocation. Moderate osteoarthritis of the left hip. No discernible left hip joint effusion. No lytic or sclerotic bone lesion. Ligaments Suboptimally assessed by CT. Muscles and Tendons No acute musculotendinous abnormality by CT. Soft tissues Extensive soft tissue edema and ill-defined fluid of the left hip and proximal left thigh, nonspecific. No organized or rim enhancing fluid collections. No soft tissue gas. Large right and small left hydroceles. No left inguinal lymphadenopathy. Prominent vascular calcification. IMPRESSION: 1. Redemonstration of healing subacute nondisplaced fractures of the left  inferior pubic ramus, left pubic root, and left acetabulum. No change in alignment. No new fractures. 2. Extensive  soft tissue edema and ill-defined fluid of the left hip and proximal left thigh, nonspecific, but may represent cellulitis in the appropriate clinical setting. No organized or rim enhancing fluid collections. No soft tissue gas. 3. No left hip joint effusion is seen. 4. Large right and small left hydroceles. Electronically Signed   By: Davina Poke D.O.   On: 09/11/2022 16:55    PHYSICAL EXAM  General - Well nourished, well developed elderly African-American male intubated off sedation.   Ophthalmologic - fundi not visualized due to noncooperation.   Cardiovascular - irregularly irregular heart rate and rhythm.   Neuro - intubated off sedation, drowsy but eyes open on voice, following simple commands. Eyes in more left gaze preference position, slowly tracking to right but not complete right gaze, pupils 46m and reactive to light. Corneal reflex present, gag and cough present. Breathing over the vent.   Facial symmetry not able to test due to ET tube.  Tongue protrusion not cooperative. Follows simple commands and against gravity BUEs left greater than right, wiggle toes on b/l feet. DTR 1+ and no babinski. Sensation, coordination not cooperative and gait not tested.     ASSESSMENT/PLAN Mr. DBIRUK TROIAis a 87y.o. male with history of  alcoholic cardiomyopathy, permanent atrial fibrillation on Eliquis, CHF with an EF 25 to 30% with global hypokinesis, CAD s/p stenting, DM2, prior stroke, chronic bilateral lower extremity wounds with edema, LLE peroneal neuropathy with chronic foot drop, COPD, HLD and HTN who presented to the ED via EMS this afternoon after he was found down.  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  CTA head & neck Occlusion of the left V4 segment and poor opacification of the basilar artery with apparent occlusion distally, age indeterminate. The bilateral PCAs are supplied by posterior communicating arteries with likely retrograde flow into the P1 segments.Multifocal high-grade stenosis of the right M2 and M3 branches   S/p IR with TICI2c reperfusion MRI - Multiple areas of acute infarct involving the cerebellum bilaterally, left pons, and right frontal white matter. 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 Large area of chronic infarct and mild chronic hemorrhage and in the right MCA territory.  MRA  - Basilar artery is patent now.   1/11- CT repeat - no hydrocephalus  1/12- CT repeat - no hydrocephalus 2D Echo EF 20-25%  LDL 56 HgbA1c 6.5 VTE prophylaxis - Heparin subq Eliquis (apixaban) daily prior to admission,   resumed AC with heparin IV 09/10/2022 Therapy recommendations:  CIR Disposition:  Pending  Chronic Atrial Fibrillation  Home med: Eliquis Rate controlled Heparin IV  Congestive heart failure Alcoholic cardiomyopathy 82/1308EF 25 to 30%.  This admission EF 20 to 25%. Home meds including Imdur, digoxin, Jardiance, lasix On digoxin CCM managing Pt follows with Dr. MAundra Dubinas outpt  Hypertension Home meds:  Lasix, coreg, aldactone Stable BP goal < 180/105 Long-term BP goal normotensive  Post operative respiratory insufficiency  Remains intubated post procedure CCM to manage ventilator settings Hx of COPD Extubate as able  Hyperlipidemia Home meds:  Atorvastatin '80mg'$   LDL 56, goal < 70 AST/ALT 64/48-> 33/30 Resumed lipitor 80 Continue statin at discharge  Diabetes type II Controlled Home meds:  Jardiance, metformin HgbA1c 6.5, goal < 7.0 CBGs SSI  Other Stroke Risk Factors Advanced Age >/= 63  Hx stroke/TIA Evidence of chronic infarcts on imaging Coronary artery disease status  post stenting patient  Other Active Problems Chronic BLE wound Chronic b/l foot drop Recent left hip fracture - walking with walker at home Leukopenia WBC 3.8->7.3->5.6 -> 3.8->5.4 Hypokalemia K 3.2 -> 3.7->3.6 Febrile-Tmax 103F ID consulted Blood cultures respiratory culture and urine culture sent  Patient's had mild hemorrhagic transformation into his right cerebellar infarct hence have discontinued IV heparin for now.  Will repeat brain imaging in 2 to 3 days to resume if this is resolved.  Will stay on aspirin till then.  Patient continues to require ventilatory support for respiratory failure and attempts to wean him up so far not been successful.  Plan to start hypertonic saline for a few days to reduce cytotoxic cerebral edema.  Long discussion with patient's sister yesterday at the bedside and with with Dr. Ander Slade today and answered questions.This patient is critically ill and at significant risk of neurological worsening, death and care requires constant monitoring of vital signs, hemodynamics,respiratory and cardiac monitoring, extensive review of multiple databases, frequent neurological assessment, discussion with family, other specialists and medical decision making of high complexity.I have made any additions or clarifications directly to the above note.This critical care time does not reflect procedure time, or teaching time or supervisory time of PA/NP/Med Resident etc but could involve care discussion time.  I spent 32 minutes of neurocritical care time  in the care of  this patient.      Vincent Contras, MD Medical Director [facility] Stroke Center Pager: [phone] 09/12/2022 1:20 PM   To contact Stroke Continuity provider, please refer to [email or web address] After hours, contact General Neurology

## 2022-09-12 NOTE — Progress Notes (Signed)
NAME:  CLINE DRAHEIM, MRN:  [id number], DOB:  1935-09-13, LOS: 7 ADMISSION DATE:  09/09/2022, CONSULTATION DATE:  09/06/2022 REFERRING MD:  Marjory Lies - NIR CHIEF COMPLAINT:  S/p basilar artery occlusion, vent Management   History of Present Illness:  87 year old man who presented to [facility] ED 1/8 as a Code Stroke after being found down at home. Presented with R gaze preference, L-sided deficits and dysarthria. PMHx significant for HTN, HLD, AF (on Eliquis), CHF (EF 25-30%), CAD s/p stent (2004), prior CVA with residual L-sided deficits, COPD, T2DM.  CTA Head/Neck demonstrated distal basilar artery occlusion, left V4 occlusion. He was seen by neurology and IR and was taken to IR for revascularization of distal basilar artery; TICI 2C revascularization achieved. Post-procedure CT negative for hemorrhage.  Post-procedure, he was left intubated and was transferred to ICU.  PCCM was asked to see for vent management.  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 s/p remote stent to Cx 2004 with chronic stable angina in context of residual circumflex disease.   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 1/11 agitated with vent wean 1/12 less interactive. CT head with no acute findings. Failed SBT with tachypnea. Given lasix with 2L out  1/15 Repeat CT head (09/12/2022 05:41): Stable post-cerebellar infarction petechial hemorrhage (Heidelberg classification 1c: PH1), most apparent in the right cerebellar hemisphere. No significant posterior fossa mass effect or other complicating features. Recent bilateral cerebellar and brainstem infarcts with chronic supratentorial ischemia and no new intracranial abnormality identified.   Interim History / Subjective:  Some agitation overnight Tmax of 102  Objective:  Blood pressure (!) 127/47, pulse (!) 54, temperature 98.3 F (36.8 C), temperature source Axillary, resp. rate 20, height '5\' 9"'$  (1.753 m), weight 75.7 kg, SpO2 100 %.    Vent Mode: CPAP;PSV FiO2 (%):  [30 %] 30 % Set Rate:  [15 bmp] 15 bmp Vt Set:  [560 mL] 560 mL PEEP:  [5 cmH20] 5 cmH20 Pressure Support:  [10 cmH20] 10 cmH20 Plateau Pressure:  [12 cmH20-16 cmH20] 12 cmH20   Intake/Output Summary (Last 24 hours) at 09/12/2022 0855 Last data filed at 09/12/2022 0800 Gross per 24 hour  Intake 2385.1 ml  Output 1600 ml  Net 785.1 ml   Filed Weights   09/06/22 0200 09/06/22 0432  Weight: 84.2 kg 75.7 kg   General: Chronically ill-appearing HEENT: Endotracheal tube in place, orogastric tube in place Neuro: Opens eyes to commands, does attempt to squeeze upper extremities  bilaterally, minimal movement of left lower extremity CV: S1-S2 appreciated, irregularly irregular PULM: Coarse breath sounds GI: Bowel sounds appreciated GU: Cloudy urine, Foley in place Extremities: Skin is warm and dry, 1+ edema lower extremities Skin: no rashes or lesions  CT head reviewed Blood cultures with MSSA-on cefazolin -Repeat cultures 1/14  Assessment & Plan:    Baseline artery occlusion Acute encephalopathy -S/p revascularization by IR -CT scan 1/15 noted -On aspirin 253 Goal systolic blood pressure less than 180 -On Precedex for RASS goal of 0 to -1 -On very low-dose, will wean off -Continue neuroprotective measures including head of the bed elevation greater than 30 degrees, normoglycemia, normothermia, address electrolytes  Respiratory insufficiency -Continue full ventilator support -Appears to be tolerating weaning at the present time -Will plan extubation if he continues to wean well  History of COPD Hemoptysis -Full vent support -Maintain head of the bed elevation at 30 degrees -Plateau pressures less than 30 -Continue bronchial hygiene -Weaning as tolerated-tolerating weaning this morning  History of atrial fibrillation on Eliquis Systolic congestive heart failure with ejection fraction 25 to 30%-repeat echo unchanged from previous Coronary artery disease status post stenting Hypertension Hyperlipidemia Heart failure with reduced ejection fraction -Heparin now on hold secondary to cerebellar petechial hemorrhage -Continue statins -Imdur, Coreg on hold -continue spironolactone and Entresto  Fever No significant leukocytosis -Continue to trend -Blood cultures with MSSA, on cefazolin, blood culture repeated 1/14 -Appreciate infectious disease input -Respiratory viral panel negative  Diabetes -Continue SSI -Goal CBG 140-180  Hematuria -Monitor  Best practice (evaluated daily):  Diet/type: TF DVT prophylaxis: SCD GI prophylaxis: PPI Lines: N/A Foley:  External foley in place  Code Status:  full code Last date of multidisciplinary goals of care discussion: Per primary  The patient is critically ill with multiple organ systems failure and requires high complexity decision making for assessment and support, frequent evaluation and titration of therapies, application of advanced monitoring technologies and  extensive interpretation of multiple databases. Critical Care Time devoted to patient care services described in this note independent of APP/resident time (if applicable)  is 35 minutes.   Sherrilyn Rist MD [facility] Personal pager: See Amion If unanswered, please page CCM On-call: [phone]

## 2022-09-12 NOTE — TOC CAGE-AID Note (Signed)
Transition of Care [facility]) - CAGE-AID Screening   Patient Details  Name: Vincent [name] MRN: [id number] Date of Birth: [birth year]  Transition of Care [facility]) CM/SW Contact:    Bethann Berkshire, [place] Phone Number: 09/12/2022, 10:10 AM   CAGE-AID Screening: Substance Abuse Screening unable to be completed due to: : Patient unable to participate

## 2022-09-12 NOTE — Progress Notes (Signed)
PT Cancellation Note  Patient Details Name: Vincent [name] MRN: [id number] DOB: 03-01-1936   Cancelled Treatment:    Reason Eval/Treat Not Completed: Patient not medically ready.  Pt with new small focus of hemorrhagic conversion within infarcted tissue of the right cerebellum.  Per nursing recommendation, will hold PT today and check back tomorrow for appropriateness.    Tessie Fass Wenceslaus Gist 09/12/2022, 10:05 AM

## 2022-09-12 NOTE — Progress Notes (Signed)
Pharmacy Electrolyte Replacement  Recent Labs:  Recent Labs    09/12/22 0027  K 3.6  MG 2.0  PHOS 2.6  CREATININE 1.01    Low Critical Values (K </= 2.5, Phos </= 1, Mg </= 1) Present: None  MD Contacted: n/a - no critical values noted  Plan: Give KCl 106mq PT x 1 dose Recheck in AM per protocol   HArturo Morton PharmD, BCPS Please check AMION for [place] Clinical Pharmacist 09/12/2022 10:05 AM

## 2022-09-13 DIAGNOSIS — B9561 Methicillin susceptible Staphylococcus aureus infection as the cause of diseases classified elsewhere: Secondary | ICD-10-CM | POA: Diagnosis not present

## 2022-09-13 DIAGNOSIS — I639 Cerebral infarction, unspecified: Secondary | ICD-10-CM | POA: Diagnosis not present

## 2022-09-13 DIAGNOSIS — Z9889 Other specified postprocedural states: Secondary | ICD-10-CM | POA: Diagnosis not present

## 2022-09-13 DIAGNOSIS — R7881 Bacteremia: Secondary | ICD-10-CM | POA: Diagnosis not present

## 2022-09-13 LAB — BASIC METABOLIC PANEL
Anion gap: 9 (ref 5–15)
Anion gap: 9 (ref 5–15)
BUN: 26 mg/dL — ABNORMAL HIGH (ref 8–23)
BUN: 27 mg/dL — ABNORMAL HIGH (ref 8–23)
CO2: 29 mmol/L (ref 22–32)
CO2: 28 mmol/L (ref 22–32)
Calcium: 8 mg/dL — ABNORMAL LOW (ref 8.9–10.3)
Calcium: 7.6 mg/dL — ABNORMAL LOW (ref 8.9–10.3)
Chloride: 112 mmol/L — ABNORMAL HIGH (ref 98–111)
Chloride: 115 mmol/L — ABNORMAL HIGH (ref 98–111)
Creatinine, Ser: 0.82 mg/dL (ref 0.61–1.24)
Creatinine, Ser: 1.09 mg/dL (ref 0.61–1.24)
GFR, Estimated: >60 mL/min (ref >60)
GFR, Estimated: >60 mL/min (ref >60)
Glucose, Bld: 227 mg/dL — ABNORMAL HIGH (ref 70–99)
Glucose, Bld: 258 mg/dL — ABNORMAL HIGH (ref 70–99)
Potassium: 3.7 mmol/L (ref 3.5–5.1)
Potassium: 3.6 mmol/L (ref 3.5–5.1)
Sodium: 150 mmol/L — ABNORMAL HIGH (ref 135–145)
Sodium: 152 mmol/L — ABNORMAL HIGH (ref 135–145)

## 2022-09-13 LAB — GLUCOSE, CAPILLARY
Glucose-Capillary: 111 mg/dL — ABNORMAL HIGH (ref 70–99)
Glucose-Capillary: 142 mg/dL — ABNORMAL HIGH (ref 70–99)
Glucose-Capillary: 159 mg/dL — ABNORMAL HIGH (ref 70–99)
Glucose-Capillary: 178 mg/dL — ABNORMAL HIGH (ref 70–99)
Glucose-Capillary: 183 mg/dL — ABNORMAL HIGH (ref 70–99)
Glucose-Capillary: 246 mg/dL — ABNORMAL HIGH (ref 70–99)

## 2022-09-13 LAB — MAGNESIUM: Magnesium: 2.2 mg/dL (ref 1.7–2.4)

## 2022-09-13 LAB — CULTURE, BLOOD (ROUTINE X 2)
Special Requests: ADEQUATE
Special Requests: ADEQUATE

## 2022-09-13 LAB — CBC
HCT: 31.4 % — ABNORMAL LOW (ref 39.0–52.0)
Hemoglobin: 9.4 g/dL — ABNORMAL LOW (ref 13.0–17.0)
MCH: 26.6 pg (ref 26.0–34.0)
MCHC: 29.9 g/dL — ABNORMAL LOW (ref 30.0–36.0)
MCV: 89 fL (ref 80.0–100.0)
Platelets: 141 10*3/uL — ABNORMAL LOW (ref 150–400)
RBC: 3.53 MIL/uL — ABNORMAL LOW (ref 4.22–5.81)
RDW: 18.2 % — ABNORMAL HIGH (ref 11.5–15.5)
WBC: 7 10*3/uL (ref 4.0–10.5)
nRBC: 0 % (ref 0.0–0.2)

## 2022-09-13 LAB — PHOSPHORUS: Phosphorus: 1.9 mg/dL — ABNORMAL LOW (ref 2.5–4.6)

## 2022-09-13 LAB — SODIUM
Sodium: 156 mmol/L — ABNORMAL HIGH (ref 135–145)
Sodium: 175 mmol/L (ref 135–145)

## 2022-09-13 LAB — PROCALCITONIN: Procalcitonin: 0.64 ng/mL

## 2022-09-13 MED ORDER — MIDAZOLAM HCL 2 MG/2ML IJ SOLN
INTRAMUSCULAR | Status: AC
  Filled 2022-09-13: qty 2

## 2022-09-13 MED ORDER — CEFAZOLIN SODIUM-DEXTROSE 2-4 GM/100ML-% IV SOLN
2.0000 g | Freq: Four times a day (QID) | INTRAVENOUS | Status: DC
  Administered 2022-09-13 – 2022-09-16 (×11): 2 g via INTRAVENOUS
  Filled 2022-09-13 (×12): qty 100

## 2022-09-13 MED ORDER — SODIUM CHLORIDE 3 % IV SOLN: INTRAVENOUS | Status: DC

## 2022-09-13 MED ORDER — MIDAZOLAM HCL 2 MG/2ML IJ SOLN
2.0000 mg | Freq: Once | INTRAMUSCULAR | Status: AC
  Administered 2022-09-13: 2 mg via INTRAVENOUS

## 2022-09-13 MED ORDER — CHLORHEXIDINE GLUCONATE CLOTH 2 % EX PADS
6.0000 | MEDICATED_PAD | Freq: Every day | CUTANEOUS | Status: DC
  Administered 2022-09-14 – 2022-09-18 (×5): 6 via TOPICAL

## 2022-09-13 MED ORDER — SODIUM CHLORIDE 3 % IV SOLN
INTRAVENOUS | Status: DC
  Filled 2022-09-13 (×2): qty 500

## 2022-09-13 MED ORDER — POTASSIUM PHOSPHATES 15 MMOLE/5ML IV SOLN
30.0000 mmol | Freq: Once | INTRAVENOUS | Status: AC
  Administered 2022-09-13: 30 mmol via INTRAVENOUS
  Filled 2022-09-13: qty 10

## 2022-09-13 MED ORDER — MIDAZOLAM HCL 2 MG/2ML IJ SOLN
1.0000 mg | Freq: Once | INTRAMUSCULAR | Status: AC
  Administered 2022-09-13: 1 mg via INTRAVENOUS

## 2022-09-13 NOTE — Progress Notes (Signed)
STROKE TEAM PROGRESS NOTE   INTERVAL HISTORY Patient was febrile yesterday and found to have left upper extremity thrombophlebitis and positive blood cultures for MSSA and has been started on cefazolin. Pt still intubated and on vent for respiratory failure, becomes tachypneic and tachycardic this a.m. requiring sedation with Precedex   No family at the bedside.  Sleepy but following commands well and moving all 4 extremities purposefully though with mild right hemiparesis nods head to voice,     Vital signs stable.  He is still requiring ventilatory support for respiratory failure   Patient is on hypertonic saline and serum sodium is at goal 150. Vitals:   09/13/22 1200 09/13/22 1300 09/13/22 1400 09/13/22 1532  BP: (!) 141/65 (!) 114/52 (!) 106/54   Pulse: 67 64 69 65  Resp: (!) 0 (!) 0 (!) 23 (!) 22  Temp: 100.1 F (37.8 C)     TempSrc: Oral     SpO2: 100% 99% 98% 97%  Weight:      Height:       CBC:  Recent Labs  Lab 09/12/22 0027 09/13/22 0426  WBC 7.3 7.0  HGB 9.2* 9.4*  HCT 30.1* 31.4*  MCV 87.0 89.0  PLT 122* 010*   Basic Metabolic Panel:  Recent Labs  Lab 09/12/22 0027 09/12/22 1012 09/13/22 0426 09/13/22 0912  NA 143   < > 150* 156*  K 3.6  --  3.7  --   CL 107  --  112*  --   CO2 26  --  29  --   GLUCOSE 208*  --  227*  --   BUN 25*  --  26*  --   CREATININE 1.01  --  0.82  --   CALCIUM 7.8*  --  8.0*  --   MG 2.0  --  2.2  --   PHOS 2.6  --  1.9*  --    < > = values in this interval not displayed.   Lipid Panel:  Recent Labs  Lab 09/07/22 0232  TRIG 81   HgbA1c:  No results for input(s): "HGBA1C" in the last 168 hours.  Urine Drug Screen: No results for input(s): "LABOPIA", "COCAINSCRNUR", "LABBENZ", "AMPHETMU", "THCU", "LABBARB" in the last 168 hours.  Alcohol Level No results for input(s): "ETH" in the last 168 hours.  IMAGING past 24 hours No results found.  PHYSICAL EXAM  General - Well nourished, well developed elderly  African-American male intubated off sedation.   Ophthalmologic - fundi not visualized due to noncooperation.   Cardiovascular - irregularly irregular heart rate and rhythm.   Neuro - intubated off sedation, drowsy but eyes open on voice, following simple commands. Eyes in more left gaze preference position, slowly tracking to right but not complete right gaze, pupils 61m and reactive to light. Corneal reflex present, gag and cough present. Breathing over the vent.   Facial symmetry not able to test due to ET tube.  Tongue protrusion not cooperative. Follows simple commands and against gravity BUEs left greater than right, wiggle toes on b/l feet. DTR 1+ and no babinski. Sensation, coordination not cooperative and gait not tested.     ASSESSMENT/PLAN Mr. Vincent LEFEVERSis a 87y.o. male with history of  alcoholic cardiomyopathy, permanent atrial fibrillation on Eliquis, CHF with an EF 25 to 30% with global hypokinesis, CAD s/p stenting, DM2, prior stroke, chronic bilateral lower extremity wounds with edema, LLE peroneal neuropathy with chronic foot drop, COPD, HLD and HTN who presented  to the ED via EMS this afternoon after he was found down.  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  CTA head & neck Occlusion of the left V4 segment and poor opacification of the basilar artery with apparent occlusion distally, age indeterminate. The bilateral PCAs are supplied by posterior communicating arteries with likely retrograde flow into the P1 segments.Multifocal high-grade stenosis of the right M2 and M3 branches   S/p IR with TICI2c reperfusion MRI - Multiple areas of acute infarct involving the cerebellum bilaterally, left pons, and right frontal white matter. Chronic infarcts in the cerebellum bilaterally with  evidence of prior hemorrhage. Large area of chronic infarct and mild chronic hemorrhage and in the right MCA territory.  MRA  - Basilar artery is patent now.   1/11- CT repeat - no hydrocephalus  1/12- CT repeat - no hydrocephalus 2D Echo EF 20-25%  LDL 56 HgbA1c 6.5 VTE prophylaxis - Heparin subq Eliquis (apixaban) daily prior to admission,   resumed AC with heparin IV 09/10/2022 Therapy recommendations:  CIR Disposition:  Pending  Chronic Atrial Fibrillation  Home med: Eliquis Rate controlled Heparin IV  Congestive heart failure Alcoholic cardiomyopathy 03/6766 EF 25 to 30%.  This admission EF 20 to 25%. Home meds including Imdur, digoxin, Jardiance, lasix On digoxin CCM managing Pt follows with Dr. Aundra [name] as outpt  Hypertension Home meds:  Lasix, coreg, aldactone Stable BP goal < 180/105 Long-term BP goal normotensive  Post operative respiratory insufficiency  Remains intubated post procedure CCM to manage ventilator settings Hx of COPD Extubate as able  Hyperlipidemia Home meds:  Atorvastatin '80mg'$   LDL 56, goal < 70 AST/ALT 64/48-> 33/30 Resumed lipitor 80 Continue statin at discharge  Diabetes type II Controlled Home meds:  Jardiance, metformin HgbA1c 6.5, goal < 7.0 CBGs SSI  Other Stroke Risk Factors Advanced Age >/= 12  Hx stroke/TIA Evidence of chronic infarcts on imaging Coronary artery disease status post stenting patient  Other Active Problems Chronic BLE wound Chronic b/l foot drop Recent left hip fracture - walking with walker at home Leukopenia WBC 3.8->7.3->5.6 -> 3.8->5.4 Hypokalemia K 3.2 -> 3.7->3.6 Febrile-Tmax 103F ID consulted Blood cultures respiratory culture and urine culture sent  Patient neurological exam is limited due to sedation.  Patient continues to require ventilatory support for respiratory failure and attempts to wean him up so far not been successful.  Plan to continue hypertonic saline for a few days to reduce  cytotoxic cerebral edema.  Long discussion with patient's sister in the afternoon after rounds at the bedside and with with Dr. Ander [name] today and answered questions.sister also spoke to patient's best friend over the phone Vincent [name] and they are going to decide on goals of care and let us know soon.  This patient is critically ill and at significant risk of neurological worsening, death and care requires constant monitoring of vital signs, hemodynamics,respiratory and cardiac monitoring, extensive review of multiple databases, frequent neurological assessment, discussion with family, other specialists and medical decision making of high complexity.I have made any additions or clarifications directly to the above note.This critical care time does not reflect procedure time, or teaching time or supervisory time of PA/NP/Med Resident etc but could involve care discussion time.  I spent 50 minutes of neurocritical care time  in the care of  this patient.      Vincent Contras, MD Medical Director [facility] Pager: [phone] 09/13/2022 3:34 PM   To contact Stroke Continuity provider, please refer to [email or web address] After hours, contact General Neurology

## 2022-09-13 NOTE — Progress Notes (Signed)
  Interdisciplinary Goals of Care Family Meeting   Date carried out: 09/13/2022  Location of the meeting: Conference room  Member's involved: Physician, Bedside Registered Nurse, and Family Member or next of kin  Durable Power of Attorney or acting medical decision maker: Patient's Sister Vincent [name]  Discussion: We discussed goals of care for Vincent [name] .  Myself and Dr. Leonie Man in the presence of patient's bedside nurse and a conference call in with the patient's best friend Vincent [name].  Had a goals of care conversation with Vincent [name], she called patient's best friend Vincent [name] who was on the line for the conversation. We discussed ongoing care, severity of his stroke, difficulty getting him off the ventilator.  Ongoing limitations with his massive stroke.  Difficulty with rehab.  Difficulty with being able to make significant strides to live independently.  We also addressed patient's CODE STATUS Vincent [name] will continue to discuss with patient's best friend and come to decision in the next few days.  At present she wants him to remain a full code and continue receiving full scope of care.  Code status:   Code Status: Full Code   Disposition: Continue current acute care  Time spent for the meeting: 35 minutes    Aqua Denslow Clearence Ped, MD  09/13/2022, 7:42 PM

## 2022-09-13 NOTE — Progress Notes (Signed)
NAME:  Vincent [name], MRN:  [id number], DOB:  October 09, 1935, LOS: 8 ADMISSION DATE:  09/07/2022, CONSULTATION DATE:  09/06/2022 REFERRING MD:  Marjory Lies - NIR CHIEF COMPLAINT:  S/p basilar artery occlusion, vent Management   History of Present Illness:  87 year old man who presented to [facility] ED 1/8 as a Code Stroke after being found down at home. Presented with R gaze preference, L-sided deficits and dysarthria. PMHx significant for HTN, HLD, AF (on Eliquis), CHF (EF 25-30%), CAD s/p stent (2004), prior CVA with residual L-sided deficits, COPD, T2DM.  CTA Head/Neck demonstrated distal basilar artery occlusion, left V4 occlusion. He was seen by neurology and IR and was taken to IR for revascularization of distal basilar artery; TICI 2C revascularization achieved. Post-procedure CT negative for hemorrhage.  Post-procedure, he was left intubated and was transferred to ICU.  PCCM was asked to see for vent management.  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 s/p remote stent to Cx 2004 with chronic stable angina in context of residual circumflex disease.   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 1/11 agitated with vent wean 1/12 less interactive. CT head with no acute findings. Failed SBT with tachypnea. Given lasix with 2L out  1/15 Repeat CT head (09/12/2022 05:41): Stable post-cerebellar infarction petechial hemorrhage (Heidelberg classification 1c: PH1), most apparent in the right cerebellar hemisphere. No significant posterior fossa mass effect or other complicating features. Recent bilateral cerebellar and brainstem infarcts with chronic supratentorial ischemia and no new intracranial abnormality identified.  1/15 started on hypertonic saline  Interim History / Subjective:  Some agitation overnight, required fentanyl, Versed Tmax 103  Objective:  Blood pressure (!) 124/49, pulse 99, temperature 99.3 F (37.4 C), temperature source Axillary, resp. rate (!) 25, height '5\' 9"'$  (1.753 m), weight 75.7 kg, SpO2 97 %.    Vent Mode: PRVC FiO2 (%):  [30 %] 30 % Set Rate:  [15 bmp] 15 bmp Vt Set:  [560 mL] 560 mL PEEP:  [5 cmH20] 5 cmH20 Pressure Support:  [10 cmH20] 10 cmH20 Plateau Pressure:  [14 cmH20-16 cmH20] 15 cmH20   Intake/Output Summary (Last 24 hours) at 09/13/2022 0944 Last data filed at 09/13/2022 0500 Gross per 24 hour  Intake 2999.71 ml  Output 2300 ml  Net 699.71 ml   Filed Weights   09/06/22 0200 09/06/22 0432  Weight: 84.2 kg 75.7 kg   General: Chronically ill-appearing HEENT: Endotracheal tube in place, orogastric tube in place Neuro: Opens eyes to  commands, unable to follow commands as he is sedated  CV: S1-S2 appreciated, irregularly irregular PULM: Coarse breath sounds GI: Bowel sounds appreciated GU: Foley in place Extremities: Skin is warm and dry, 1+ edema lower extremities Skin: no rashes or lesions  CT head reviewed Blood cultures with MSSA-on cefazolin -Repeat cultures 1/14-negative to date  Assessment  & Plan:   Basilar artery occlusion acute encephalopathy -S/p revascularization by IR -New CT scan on 1/15 noted for petechial bleeding into cerebellar infarct -Anticoagulation discontinued -On aspirin 597 -Goal systolic blood pressure less than 180 -On Precedex, did receive Versed this morning -Continue neuroprotective measures including head of the bed elevation, normoglycemia, normothermia, address electrolytes  Respiratory failure -Continue full ventilator support -Appears to be tolerating weaning -Not an extubation candidate at present  History of COPD Hemoptysis -Continue full vent support -Maintain head of bed elevation greater than 30 degrees -Plateau pressures less than 30 -Continue bronchial hygiene  History of atrial fibrillation on Eliquis Systolic congestive heart failure with ejection fraction of 25 to 30% -Repeat echo was unchanged from previous  Hypertension Hyperlipidemia Heart failure with reduced ejection fraction -Heparin had to be held secondary to cerebral petechial hemorrhages -Continue statins -Imdur, Coreg on hold -Continue spironolactone and Entresto  Fever No significant leukocytosis -Blood cultures with MSSA on cefazolin, blood culture repeat on 1/14-no results so far -Appreciate infectious disease input -Respiratory viral panel negative  Diabetes -Continue SSI  Cephalic vein from doses -Unable to anticoagulate at present  Goals of care discussions with family member ongoing  Building surveyor (evaluated daily):  Diet/type: TF DVT prophylaxis: SCD GI prophylaxis: PPI Lines: N/A Foley:  External foley in place  Code Status:  full code Last date of multidisciplinary goals of care discussion: Per primary  The patient is critically ill with multiple organ systems failure and requires high complexity decision making for assessment and support, frequent evaluation and titration of therapies, application of advanced monitoring technologies and  extensive interpretation of multiple databases. Critical Care Time devoted to patient care services described in this note independent of APP/resident time (if applicable)  is 31 minutes.   Sherrilyn Rist MD [place] Personal pager: See Amion If unanswered, please page CCM On-call: [phone]

## 2022-09-13 NOTE — Progress Notes (Addendum)
[place] Progress Note Patient Name: Vincent [name] DOB: 07-15-1936 MRN: [id number]   Date of Service  09/13/2022  HPI/Events of Note  Camera red alert.  On Trach to Vent, anxious. Sinus tachy 122, MAP > 80, no diaphoresis. Received already total 100 mg of fenta, on precedex gtt. In synchrony with vent, Ve good, PIP 15.   eICU Interventions  Versed 1 mg IV once for now.      Intervention Category Intermediate Interventions: Other: Minor Interventions: Agitation / anxiety - evaluation and management  Elmer Sow 09/13/2022, 6:30 AM  6:37 Electrolyte replacement protocol ordered.

## 2022-09-13 NOTE — Progress Notes (Signed)
PT Cancellation Note  Patient Details Name: Vincent [name] MRN: [id number] DOB: [birth year]   Cancelled Treatment:    Reason Eval/Treat Not Completed: Medical issues which prohibited therapy; RN reports not hemodynamically stable.  Noted PT eval on 1/10 and cancelled now 4 times since due to medical issues.  Will sign off and anticipate new orders when pt appropriate.    Reginia Naas 09/13/2022, 1:03 PM Magda Kiel, PT Acute Rehabilitation Services Office:[phone] 09/13/2022

## 2022-09-13 NOTE — Progress Notes (Signed)
[facility] for Infectious Disease  Date of Admission:  09/14/2022     Total days of antibiotics 4         ASSESSMENT:  Vincent [name] had fever of 103.3 F overnight and remains febrile today from unclear source.  Vascular ultrasound with acute superficial vein thrombosis involving the left cephalic vein although would not anticipate this causing a high fever. Cannot rule out neurogenic fever. No central lines or devices present. CT head 1/15 with no new intracranial abnormalities identified with stable petechial hemorrhage and recent bilateral cerebellar and brainstem infarcts. Repeat blood cultures. Increase Cefazolin to every 6 hours for CNS penetration. Remains on hypertonic saline per Neurology. Agree with continued on-going goals of care. Remaining medical and supportive care per PCCM.  PLAN:  Change Cefazolin to q 6 dosing.  Repeat blood cultures and monitor previous cultures.  Monitor fever curve.  Remaining medical and supportive care per PCCM.   Principal Problem:   Status post surgery Active Problems:   Staphylococcus aureus bacteremia   Stroke (cerebrum) (HCC)   Basilar artery occlusion   Chronic respiratory failure with hypoxia (HCC)   Basilar artery obstruction   Malnutrition of moderate degree   Encephalopathy acute   Ventilator dependence (HCC)    aspirin  325 mg Per Tube Daily   atorvastatin  80 mg Per Tube Daily   brimonidine  1 drop Both Eyes BID   carvedilol  3.125 mg Per Tube BID   Chlorhexidine Gluconate Cloth  6 each Topical Q0600   docusate  100 mg Per Tube BID   feeding supplement (PROSource TF20)  60 mL Per Tube BID   furosemide  40 mg Intravenous Daily   insulin aspart  0-15 Units Subcutaneous Q4H   insulin aspart  4 Units Subcutaneous Q4H   mouth rinse  15 mL Mouth Rinse Q2H   pantoprazole (PROTONIX) IV  40 mg Intravenous Q24H   polyethylene glycol  17 g Per Tube Daily   sacubitril-valsartan  1 tablet Per Tube BID   spironolactone  25 mg Per  Tube Daily    SUBJECTIVE:  Febrile overnight with max temperature of 103.3 F. Anxious on vent overnight requiring 1 mg of midazolam. Remains intubated.  No Known Allergies   Review of Systems: Review of Systems  Unable to perform ROS: Intubated      OBJECTIVE: Vitals:   09/13/22 1100 09/13/22 1124 09/13/22 1158 09/13/22 1200  BP: (!) 115/51   (!) 141/65  Pulse: 71   67  Resp: (!) 0   (!) 0  Temp:  (!) 101.9 F (38.8 C)    TempSrc:  Axillary    SpO2: 100%  100% 100%  Weight:      Height:       Body mass index is 24.65 kg/m.  Physical Exam Constitutional:      General: He is not in acute distress.    Appearance: He is well-developed.     Interventions: He is sedated, intubated and restrained.  Cardiovascular:     Rate and Rhythm: Normal rate and regular rhythm.     Heart sounds: Normal heart sounds.  Pulmonary:     Effort: Pulmonary effort is normal. He is intubated.     Breath sounds: Normal breath sounds.  Skin:    General: Skin is warm and dry.     Lab Results Lab Results  Component Value Date   WBC 7.0 09/13/2022   HGB 9.4 (L) 09/13/2022   HCT 31.4 (L)  09/13/2022   MCV 89.0 09/13/2022   PLT 141 (L) 09/13/2022    Lab Results  Component Value Date   CREATININE 0.82 09/13/2022   BUN 26 (H) 09/13/2022   NA 156 (H) 09/13/2022   K 3.7 09/13/2022   CL 112 (H) 09/13/2022   CO2 29 09/13/2022    Lab Results  Component Value Date   ALT 30 09/07/2022   AST 33 09/07/2022   ALKPHOS 110 09/07/2022   BILITOT 2.1 (H) 09/07/2022     Microbiology: Recent Results (from the past 240 hour(s))  Resp panel by RT-PCR (RSV, Flu A&B, Covid) Anterior Nasal Swab     Status: None   Collection Time: 09/10/2022  3:50 PM   Specimen: Anterior Nasal Swab  Result Value Ref Range Status   SARS Coronavirus 2 by RT PCR NEGATIVE NEGATIVE Final    Comment: (NOTE) SARS-CoV-2 target nucleic acids are NOT DETECTED.  The SARS-CoV-2 RNA is generally detectable in upper  respiratory specimens during the acute phase of infection. The lowest concentration of SARS-CoV-2 viral copies this assay can detect is 138 copies/mL. A negative result does not preclude SARS-Cov-2 infection and should not be used as the sole basis for treatment or other patient management decisions. A negative result may occur with  improper specimen collection/handling, submission of specimen other than nasopharyngeal swab, presence of viral mutation(s) within the areas targeted by this assay, and inadequate number of viral copies(<138 copies/mL). A negative result must be combined with clinical observations, patient history, and epidemiological information. The expected result is Negative.  Fact Sheet for Patients:  EntrepreneurPulse.com.au  Fact Sheet for Healthcare Providers:  IncredibleEmployment.be  This test is no t yet approved or cleared by the [place] FDA and  has been authorized for detection and/or diagnosis of SARS-CoV-2 by FDA under an Emergency Use Authorization (EUA). This EUA will remain  in effect (meaning this test can be used) for the duration of the COVID-19 declaration under Section 564(b)(1) of the Act, 21 U.S.C.section 360bbb-3(b)(1), unless the authorization is terminated  or revoked sooner.       Influenza A by PCR NEGATIVE NEGATIVE Final   Influenza B by PCR NEGATIVE NEGATIVE Final    Comment: (NOTE) The Xpert Xpress SARS-CoV-2/FLU/RSV plus assay is intended as an aid in the diagnosis of influenza from Nasopharyngeal swab specimens and should not be used as a sole basis for treatment. Nasal washings and aspirates are unacceptable for Xpert Xpress SARS-CoV-2/FLU/RSV testing.  Fact Sheet for Patients: EntrepreneurPulse.com.au  Fact Sheet for Healthcare Providers: IncredibleEmployment.be  This test is not yet approved or cleared by the [place] FDA and has been  authorized for detection and/or diagnosis of SARS-CoV-2 by FDA under an Emergency Use Authorization (EUA). This EUA will remain in effect (meaning this test can be used) for the duration of the COVID-19 declaration under Section 564(b)(1) of the Act, 21 U.S.C. section 360bbb-3(b)(1), unless the authorization is terminated or revoked.     Resp Syncytial Virus by PCR NEGATIVE NEGATIVE Final    Comment: (NOTE) Fact Sheet for Patients: EntrepreneurPulse.com.au  Fact Sheet for Healthcare Providers: IncredibleEmployment.be  This test is not yet approved or cleared by the [place] FDA and has been authorized for detection and/or diagnosis of SARS-CoV-2 by FDA under an Emergency Use Authorization (EUA). This EUA will remain in effect (meaning this test can be used) for the duration of the COVID-19 declaration under Section 564(b)(1) of the Act, 21 U.S.C. section 360bbb-3(b)(1), unless the authorization is terminated  or revoked.  Performed at [facility], [place] [address] [place], [place] [address]   MRSA Next Gen by PCR, Nasal     Status: None   Collection Time: 09/06/22  1:43 AM   Specimen: Nasal Mucosa; Nasal Swab  Result Value Ref Range Status   MRSA by PCR Next Gen NOT DETECTED NOT DETECTED Final    Comment: (NOTE) The GeneXpert MRSA Assay (FDA approved for NASAL specimens only), is one component of a comprehensive MRSA colonization surveillance program. It is not intended to diagnose MRSA infection nor to guide or monitor treatment for MRSA infections. Test performance is not FDA approved in patients less than 78 years old. Performed at [facility], [place] [address] [place], [place] [address]   Culture, Respiratory w Gram Stain     Status: None   Collection Time: 09/08/22 11:02 AM   Specimen: Tracheal Aspirate; Respiratory  Result Value Ref Range Status   Specimen Description TRACHEAL ASPIRATE  Final   Special Requests  NONE  Final   Gram Stain   Final    NO WBC SEEN FEW GRAM POSITIVE COCCI IN CHAINS FEW GRAM NEGATIVE RODS RARE GRAM POSITIVE RODS    Culture   Final    ABUNDANT Normal respiratory flora-no Staph aureus or Pseudomonas seen Performed at [facility], [address] [place], [place] [address]    Report Status 09/10/2022 FINAL  Final  Culture, Respiratory w Gram Stain     Status: None (Preliminary result)   Collection Time: 09/10/22  5:48 PM   Specimen: Tracheal Aspirate; Respiratory  Result Value Ref Range Status   Specimen Description TRACHEAL ASPIRATE  Final   Special Requests NONE  Final   Gram Stain   Final    ABUNDANT WBC PRESENT, PREDOMINANTLY PMN FEW GRAM POSITIVE RODS RARE GRAM POSITIVE COCCI IN SINGLES IN PAIRS FEW GRAM NEGATIVE RODS Performed at [facility], [place] [address], [place], [place] [address]    Culture FEW ENTEROBACTER GERGOVIAE  Final   Report Status PENDING  Incomplete   Organism ID, Bacteria ENTEROBACTER GERGOVIAE  Final      Susceptibility   Enterobacter gergoviae - MIC*    CEFAZOLIN <=4 SENSITIVE Sensitive     CEFEPIME <=0.12 SENSITIVE Sensitive     CEFTAZIDIME <=1 SENSITIVE Sensitive     CEFTRIAXONE <=0.25 SENSITIVE Sensitive     CIPROFLOXACIN <=0.25 SENSITIVE Sensitive     GENTAMICIN <=1 SENSITIVE Sensitive     IMIPENEM <=0.25 SENSITIVE Sensitive     TRIMETH/SULFA <=20 SENSITIVE Sensitive     PIP/TAZO <=4 SENSITIVE Sensitive     * FEW ENTEROBACTER GERGOVIAE  Culture, blood (Routine X 2) w Reflex to ID Panel     Status: Abnormal   Collection Time: 09/10/22  6:06 PM   Specimen: BLOOD  Result Value Ref Range Status   Specimen Description BLOOD SITE NOT SPECIFIED  Final   Special Requests   Final    BOTTLES DRAWN AEROBIC AND ANAEROBIC Blood Culture adequate volume   Culture  Setup Time   Final    GRAM POSITIVE COCCI IN CLUSTERS IN BOTH AEROBIC AND ANAEROBIC BOTTLES CRITICAL RESULT CALLED TO, READ BACK BY AND VERIFIED WITH: PHARMD  L.BELL AT 1206 ON 09/11/2022 BY T.SAAD. Performed at [facility], [place] [address], [place],  [address]    Culture STAPHYLOCOCCUS AUREUS (A)  Final   Report Status 09/13/2022 FINAL  Final   Organism ID, Bacteria STAPHYLOCOCCUS AUREUS  Final      Susceptibility   Staphylococcus aureus - MIC*    CIPROFLOXACIN <=0.5 SENSITIVE Sensitive     ERYTHROMYCIN <=0.25 SENSITIVE Sensitive     GENTAMICIN <=0.5 SENSITIVE Sensitive     OXACILLIN <=0.25 SENSITIVE Sensitive     TETRACYCLINE <=1 SENSITIVE Sensitive     VANCOMYCIN 1 SENSITIVE Sensitive     TRIMETH/SULFA <=10 SENSITIVE Sensitive     CLINDAMYCIN <=0.25 SENSITIVE Sensitive     RIFAMPIN <=0.5 SENSITIVE Sensitive     Inducible Clindamycin NEGATIVE Sensitive     * STAPHYLOCOCCUS AUREUS  Culture, blood (Routine X 2) w Reflex to ID Panel     Status: Abnormal   Collection Time: 09/10/22  6:06 PM   Specimen: BLOOD  Result Value Ref Range Status   Specimen Description BLOOD SITE NOT SPECIFIED  Final   Special Requests   Final    BOTTLES DRAWN AEROBIC AND ANAEROBIC Blood Culture adequate volume   Culture  Setup Time   Final    GRAM POSITIVE COCCI IN CLUSTERS IN BOTH AEROBIC AND ANAEROBIC BOTTLES CRITICAL VALUE NOTED.  VALUE IS CONSISTENT WITH PREVIOUSLY REPORTED AND CALLED VALUE.    Culture (A)  Final    STAPHYLOCOCCUS AUREUS SUSCEPTIBILITIES PERFORMED ON PREVIOUS CULTURE WITHIN THE LAST 5 DAYS. Performed at [facility], [place] [address], [place], [place] [address]    Report Status 09/13/2022 FINAL  Final  Blood Culture ID Panel (Reflexed)     Status: Abnormal   Collection Time: 09/10/22  6:06 PM  Result Value Ref Range Status   Enterococcus faecalis NOT DETECTED NOT DETECTED Final   Enterococcus Faecium NOT DETECTED NOT DETECTED Final   Listeria monocytogenes NOT DETECTED NOT DETECTED Final   Staphylococcus species DETECTED (A) NOT DETECTED Final    Comment: CRITICAL RESULT CALLED TO, READ BACK BY AND VERIFIED  WITH: PHARMD L.BELL AT 1206 ON 09/11/2022 BY T.SAAD.    Staphylococcus aureus (BCID) DETECTED (A) NOT DETECTED Final    Comment: CRITICAL RESULT CALLED TO, READ BACK BY AND VERIFIED WITH: PHARMD L.BELL AT 1206 ON 09/11/2022 BY T.SAAD.    Staphylococcus epidermidis NOT DETECTED NOT DETECTED Final   Staphylococcus lugdunensis NOT DETECTED NOT DETECTED Final   Streptococcus species NOT DETECTED NOT DETECTED Final   Streptococcus agalactiae NOT DETECTED NOT DETECTED Final   Streptococcus pneumoniae NOT DETECTED NOT DETECTED Final   Streptococcus pyogenes NOT DETECTED NOT DETECTED Final   A.calcoaceticus-baumannii NOT DETECTED NOT DETECTED Final   Bacteroides fragilis NOT DETECTED NOT DETECTED Final   Enterobacterales NOT DETECTED NOT DETECTED Final   Enterobacter cloacae complex NOT DETECTED NOT DETECTED Final   Escherichia coli NOT DETECTED NOT DETECTED Final   Klebsiella aerogenes NOT DETECTED NOT DETECTED Final   Klebsiella oxytoca NOT DETECTED NOT DETECTED Final   Klebsiella pneumoniae NOT DETECTED NOT DETECTED Final   Proteus species NOT DETECTED NOT DETECTED Final   Salmonella species NOT DETECTED NOT DETECTED Final   Serratia marcescens NOT DETECTED NOT DETECTED Final   Haemophilus influenzae NOT DETECTED NOT DETECTED Final   Neisseria meningitidis NOT DETECTED NOT DETECTED Final   Pseudomonas aeruginosa NOT DETECTED NOT DETECTED Final   Stenotrophomonas maltophilia NOT DETECTED NOT DETECTED Final   Candida albicans NOT DETECTED NOT DETECTED Final   Candida auris NOT DETECTED NOT DETECTED Final   Candida glabrata NOT DETECTED NOT DETECTED Final   Candida krusei NOT DETECTED NOT DETECTED Final   Candida parapsilosis NOT DETECTED NOT DETECTED Final   Candida tropicalis NOT  DETECTED NOT DETECTED Final   Cryptococcus neoformans/gattii NOT DETECTED NOT DETECTED Final   Meth resistant mecA/C and MREJ NOT DETECTED NOT DETECTED Final    Comment: Performed at [facility],  [place] [address], [place], [place] [address]  Respiratory (~20 pathogens) panel by PCR     Status: None   Collection Time: 09/11/22  1:06 PM   Specimen: Nasopharyngeal Swab; Respiratory  Result Value Ref Range Status   Adenovirus NOT DETECTED NOT DETECTED Final   Coronavirus 229E NOT DETECTED NOT DETECTED Final    Comment: (NOTE) The Coronavirus on the Respiratory Panel, DOES NOT test for the novel  Coronavirus (2019 nCoV)    Coronavirus HKU1 NOT DETECTED NOT DETECTED Final   Coronavirus NL63 NOT DETECTED NOT DETECTED Final   Coronavirus OC43 NOT DETECTED NOT DETECTED Final   Metapneumovirus NOT DETECTED NOT DETECTED Final   Rhinovirus / Enterovirus NOT DETECTED NOT DETECTED Final   Influenza A NOT DETECTED NOT DETECTED Final   Influenza B NOT DETECTED NOT DETECTED Final   Parainfluenza Virus 1 NOT DETECTED NOT DETECTED Final   Parainfluenza Virus 2 NOT DETECTED NOT DETECTED Final   Parainfluenza Virus 3 NOT DETECTED NOT DETECTED Final   Parainfluenza Virus 4 NOT DETECTED NOT DETECTED Final   Respiratory Syncytial Virus NOT DETECTED NOT DETECTED Final   Bordetella pertussis NOT DETECTED NOT DETECTED Final   Bordetella Parapertussis NOT DETECTED NOT DETECTED Final   Chlamydophila pneumoniae NOT DETECTED NOT DETECTED Final   Mycoplasma pneumoniae NOT DETECTED NOT DETECTED Final    Comment: Performed at [facility], [place]. [address], [place], [place] [address]  Culture, blood (Routine X 2) w Reflex to ID Panel     Status: None (Preliminary result)   Collection Time: 09/11/22  5:46 PM   Specimen: BLOOD  Result Value Ref Range Status   Specimen Description BLOOD SITE NOT SPECIFIED  Final   Special Requests   Final    BOTTLES DRAWN AEROBIC ONLY Blood Culture results may not be optimal due to an inadequate volume of blood received in culture bottles   Culture   Final    NO GROWTH 2 DAYS Performed at [facility], [place] [address], [place], [place] [address]    Report Status PENDING   Incomplete     Terri Piedra, [place] for Infectious Disease [employer]  09/13/2022  12:17 PM

## 2022-09-13 NOTE — Progress Notes (Signed)
Nutrition Follow-up  DOCUMENTATION CODES:   Non-severe (moderate) malnutrition in context of chronic illness  INTERVENTION:   Tube feeding via Cortrak tube: Osmolite 1.5 @ 50 ml/hr (1200 ml per day) Prosource TF20 60 ml BID  Provides 1960 kcal, 115 gm protein, 912 ml free water daily   NUTRITION DIAGNOSIS:   Moderate Malnutrition related to chronic illness (COPD/CHF) as evidenced by moderate fat depletion, moderate muscle depletion. Ongoing.   GOAL:   Patient will meet greater than or equal to 90% of their needs Met with TF at goal   MONITOR:   TF tolerance  REASON FOR ASSESSMENT:   Consult Enteral/tube feeding initiation and management  ASSESSMENT:   Pt with PMH of HTN, HLD, AF on Eliquis, CHF with EF 25-30%, CAD s/p stent, CVA with L-sided deficits, COPD, DM admitted with bilateral large cerebellar infarct s/p IR revascularization.   No family present at time of visit this am.  Pt with MSSA bacteremia on IV abx, per ID pt may   1/10 - s/p cortrak placement; per xray tip mid-body of stomach   Medications reviewed and include: colace, lasxi, SSI, 4 units novolog every 4 hours, protonix, miralax, spironolactone Precedex Hypertonic saline  Labs reviewed: Na 156, PO4: 1.9 (received 1 dose kphos)  CBG's: 142-237   Diet Order:   Diet Order             Diet NPO time specified  Diet effective now                   EDUCATION NEEDS:   Not appropriate for education at this time  Skin:  Skin Assessment: Reviewed RN Assessment (R venous stasis ulcer)  Last BM:  1/16  Height:   Ht Readings from Last 1 Encounters:  09/06/22 '5\' 9"'$  (1.753 m)    Weight:   Wt Readings from Last 1 Encounters:  09/06/22 75.7 kg    BMI:  Body mass index is 24.65 kg/m.  Estimated Nutritional Needs:   Kcal:  1800-2000  Protein:  110-120 grams  Fluid:  >1.8 L/day  Lockie Pares., RD, LDN, CNSC See AMiON for contact information

## 2022-09-14 DIAGNOSIS — Z9889 Other specified postprocedural states: Secondary | ICD-10-CM | POA: Diagnosis not present

## 2022-09-14 DIAGNOSIS — I639 Cerebral infarction, unspecified: Secondary | ICD-10-CM | POA: Diagnosis not present

## 2022-09-14 DIAGNOSIS — B9561 Methicillin susceptible Staphylococcus aureus infection as the cause of diseases classified elsewhere: Secondary | ICD-10-CM | POA: Diagnosis not present

## 2022-09-14 DIAGNOSIS — R7881 Bacteremia: Secondary | ICD-10-CM | POA: Diagnosis not present

## 2022-09-14 LAB — CULTURE, RESPIRATORY W GRAM STAIN

## 2022-09-14 LAB — CBC WITH DIFFERENTIAL/PLATELET
Abs Immature Granulocytes: 0.06 10*3/uL (ref 0.00–0.07)
Basophils Absolute: 0 10*3/uL (ref 0.0–0.1)
Basophils Relative: 0 %
Eosinophils Absolute: 0 10*3/uL (ref 0.0–0.5)
Eosinophils Relative: 0 %
HCT: 33.7 % — ABNORMAL LOW (ref 39.0–52.0)
Hemoglobin: 10.2 g/dL — ABNORMAL LOW (ref 13.0–17.0)
Immature Granulocytes: 1 %
Lymphocytes Relative: 5 %
Lymphs Abs: 0.4 10*3/uL — ABNORMAL LOW (ref 0.7–4.0)
MCH: 26.8 pg (ref 26.0–34.0)
MCHC: 30.3 g/dL (ref 30.0–36.0)
MCV: 88.7 fL (ref 80.0–100.0)
Monocytes Absolute: 1 10*3/uL (ref 0.1–1.0)
Monocytes Relative: 11 %
Neutro Abs: 7.4 10*3/uL (ref 1.7–7.7)
Neutrophils Relative %: 83 %
Platelets: 149 10*3/uL — ABNORMAL LOW (ref 150–400)
RBC: 3.8 MIL/uL — ABNORMAL LOW (ref 4.22–5.81)
RDW: 18.4 % — ABNORMAL HIGH (ref 11.5–15.5)
WBC: 8.9 10*3/uL (ref 4.0–10.5)
nRBC: 0 % (ref 0.0–0.2)

## 2022-09-14 LAB — COMPREHENSIVE METABOLIC PANEL
ALT: 21 U/L (ref 0–44)
AST: 70 U/L — ABNORMAL HIGH (ref 15–41)
Albumin: 1.9 g/dL — ABNORMAL LOW (ref 3.5–5.0)
Alkaline Phosphatase: 94 U/L (ref 38–126)
Anion gap: 10 (ref 5–15)
BUN: 29 mg/dL — ABNORMAL HIGH (ref 8–23)
CO2: 26 mmol/L (ref 22–32)
Calcium: 7.7 mg/dL — ABNORMAL LOW (ref 8.9–10.3)
Chloride: 117 mmol/L — ABNORMAL HIGH (ref 98–111)
Creatinine, Ser: 1.03 mg/dL (ref 0.61–1.24)
GFR, Estimated: >60 mL/min (ref >60)
Glucose, Bld: 285 mg/dL — ABNORMAL HIGH (ref 70–99)
Potassium: 3.6 mmol/L (ref 3.5–5.1)
Sodium: 153 mmol/L — ABNORMAL HIGH (ref 135–145)
Total Bilirubin: 0.5 mg/dL (ref 0.3–1.2)
Total Protein: 5.8 g/dL — ABNORMAL LOW (ref 6.5–8.1)

## 2022-09-14 LAB — MAGNESIUM: Magnesium: 2.1 mg/dL (ref 1.7–2.4)

## 2022-09-14 LAB — PHOSPHORUS: Phosphorus: 1.9 mg/dL — ABNORMAL LOW (ref 2.5–4.6)

## 2022-09-14 LAB — GLUCOSE, CAPILLARY
Glucose-Capillary: 164 mg/dL — ABNORMAL HIGH (ref 70–99)
Glucose-Capillary: 189 mg/dL — ABNORMAL HIGH (ref 70–99)
Glucose-Capillary: 193 mg/dL — ABNORMAL HIGH (ref 70–99)
Glucose-Capillary: 201 mg/dL — ABNORMAL HIGH (ref 70–99)
Glucose-Capillary: 217 mg/dL — ABNORMAL HIGH (ref 70–99)
Glucose-Capillary: 246 mg/dL — ABNORMAL HIGH (ref 70–99)

## 2022-09-14 LAB — SODIUM
Sodium: 172 mmol/L (ref 135–145)
Sodium: 154 mmol/L — ABNORMAL HIGH (ref 135–145)
Sodium: 156 mmol/L — ABNORMAL HIGH (ref 135–145)
Sodium: >180 mmol/L (ref 135–145)
Sodium: 157 mmol/L — ABNORMAL HIGH (ref 135–145)

## 2022-09-14 LAB — LEGIONELLA PNEUMOPHILA SEROGP 1 UR AG: L. pneumophila Serogp 1 Ur Ag: NEGATIVE

## 2022-09-14 MED ORDER — INSULIN ASPART 100 UNIT/ML IJ SOLN
5.0000 [IU] | INTRAMUSCULAR | Status: DC
  Administered 2022-09-14 – 2022-09-15 (×5): 5 [IU] via SUBCUTANEOUS

## 2022-09-14 MED ORDER — POTASSIUM PHOSPHATES 15 MMOLE/5ML IV SOLN
30.0000 mmol | Freq: Once | INTRAVENOUS | Status: AC
  Administered 2022-09-14: 30 mmol via INTRAVENOUS
  Filled 2022-09-14 (×2): qty 10

## 2022-09-14 NOTE — Progress Notes (Addendum)
ivWatch alarm for possible infiltration. IV checked and flushes fine with what seems to be no infiltration. IV team consult placed to check existing with possibility of new IV placement.   2254- Na >180. 3% off. Stat redraw ordered and Dr. Lorrin Goodell notified.  0408- eLink notified for K and phos replacement  0440- Taken for f/u head CT WO with RT. Dr. Lorrin Goodell notified of 156 Na, wants 3% to remain off for now.

## 2022-09-14 NOTE — Progress Notes (Signed)
Occupational Therapy Discharge Patient Details Name: Vincent [name] MRN: [id number] DOB: [birth year] Today's Date: 09/14/2022 Time:  -     Patient discharged from OT services secondary to medical decline - will need to re-order OT to resume therapy services. Pt has been a medical hold since orders written. OT to sign off pending medical readiness.  Please see latest therapy progress note for current level of functioning and progress toward goals.    Progress and discharge plan discussed with patient and/or caregiver: Patient unable to participate in discharge planning and no caregivers available Discussed with RN at bedside  GO     [name] 09/14/2022, 10:53 AM

## 2022-09-14 NOTE — Progress Notes (Signed)
Sodium up at 175 from 156. Felt to be lab error and asked lab to redraw. Hypertonic salien paused in the meantime. Sodium returned at 152 and thus hypertonic saline resumed and repeat sodium up at 175 again. Will have the lab redraw sodium from the left arm since hypertonic saline is running through the right arm. Will hold Hypertonic saline in the meantime.  Recs: - redraw sodium on the left arm.  [place] Pager Number [phone]

## 2022-09-14 NOTE — Progress Notes (Signed)
[facility] ADULT ICU REPLACEMENT PROTOCOL   The patient does apply for the [facility] Adult ICU Electrolyte Replacment Protocol based on the criteria listed below:   1.Exclusion criteria: TCTS, ECMO, Dialysis, and Myasthenia Gravis patients 2. Is GFR >/= 30 ml/min? Yes.    Patient's GFR today is >60 3. Is SCr </= 2? Yes.   Patient's SCr is 1.03 mg/dL 4. Did SCr increase >/= 0.5 in 24 hours? No. 5.Pt's weight >40kg  Yes.   6. Abnormal electrolyte(s): potassium 3.6, phos 1.9  7. Electrolytes replaced per protocol 8.  Call MD STAT for K+ </= 2.5, Phos </= 1, or Mag </= 1 Physician:  n/a  Vincent [name] 09/14/2022 3:49 AM

## 2022-09-14 NOTE — Progress Notes (Addendum)
STROKE TEAM PROGRESS NOTE   INTERVAL HISTORY Pt still intubated and on vent for respiratory failure, low dose sedation with Precedex   No family at the bedside.  Drowsy today and not much movement in extremities but does nod head.  Plan to check CT Head 1/18 am prior to resuming anticoagulation Patient is on hypertonic saline and serum sodium is at goal 154.  He is afebrile with normal white count   Vitals:   09/14/22 0800 09/14/22 0824 09/14/22 0900 09/14/22 1000  BP: 125/60 125/60 (!) 130/47 131/63  Pulse: 92 67 66 (!) 56  Resp: (!) 28 (!) 23 (!) 0 (!) 22  Temp: 98.1 F (36.7 C)     TempSrc: Oral     SpO2: 99% 99% 100% 100%  Weight:      Height:       CBC:  Recent Labs  Lab 09/13/22 0426 09/14/22 0112  WBC 7.0 8.9  NEUTROABS  --  7.4  HGB 9.4* 10.2*  HCT 31.4* 33.7*  MCV 89.0 88.7  PLT 141* 149*    Basic Metabolic Panel:  Recent Labs  Lab 09/13/22 0426 09/13/22 0912 09/13/22 2051 09/13/22 2325 09/14/22 0112 09/14/22 0917  NA 150*   < > 152*   < > 153* 154*  K 3.7  --  3.6  --  3.6  --   CL 112*  --  115*  --  117*  --   CO2 29  --  28  --  26  --   GLUCOSE 227*  --  258*  --  285*  --   BUN 26*  --  27*  --  29*  --   CREATININE 0.82  --  1.09  --  1.03  --   CALCIUM 8.0*  --  7.6*  --  7.7*  --   MG 2.2  --   --   --  2.1  --   PHOS 1.9*  --   --   --  1.9*  --    < > = values in this interval not displayed.    Lipid Panel:  No results for input(s): "CHOL", "TRIG", "HDL", "CHOLHDL", "VLDL", "LDLCALC" in the last 168 hours.  HgbA1c:  No results for input(s): "HGBA1C" in the last 168 hours.  Urine Drug Screen: No results for input(s): "LABOPIA", "COCAINSCRNUR", "LABBENZ", "AMPHETMU", "THCU", "LABBARB" in the last 168 hours.  Alcohol Level No results for input(s): "ETH" in the last 168 hours.  IMAGING past 24 hours No results found.  PHYSICAL EXAM  General - Well nourished, well developed elderly African-American male intubated off sedation.    Ophthalmologic - fundi not visualized due to noncooperation.   Cardiovascular - irregularly irregular heart rate and rhythm.   Neuro - intubated off sedation, drowsy but eyes open on voice, following simple commands. Eyes in more left gaze preference position, slowly tracking to right but not complete right gaze, pupils 47m and reactive to light. Corneal reflex present, gag and cough present. Breathing over the vent.   Facial symmetry not able to test due to ET tube.  Tongue protrusion not cooperative. Follows simple commands and against gravity BUEs left greater than right, wiggle toes on b/l feet. DTR 1+ and no babinski. Sensation, coordination not cooperative and gait not tested.     ASSESSMENT/PLAN Vincent [name] a 87y.o. male with history of  alcoholic cardiomyopathy, permanent atrial fibrillation on Eliquis, CHF with an EF 25 to 30% with global  hypokinesis, CAD s/p stenting, DM2, prior stroke, chronic bilateral lower extremity wounds with edema, LLE peroneal neuropathy with chronic foot drop, COPD, HLD and HTN who presented to the ED via EMS this afternoon after he was found down.  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  CTA head & neck Occlusion of the left V4 segment and poor opacification of the basilar artery with apparent occlusion distally, age indeterminate. The bilateral PCAs are supplied by posterior communicating arteries with likely retrograde flow into the P1 segments.Multifocal high-grade stenosis of the right M2 and M3 branches   S/p IR with TICI2c reperfusion MRI - Multiple areas of acute infarct involving the cerebellum bilaterally, left pons, and right frontal white matter. 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 Large area of chronic  infarct and mild chronic hemorrhage and in the right MCA territory.  MRA  - Basilar artery is patent now.   1/11- CT repeat - no hydrocephalus  1/12- CT repeat - no hydrocephalus 2D Echo EF 20-25%  LDL 56 HgbA1c 6.5 VTE prophylaxis - Heparin subq Eliquis (apixaban) daily prior to admission,   resumed AC with heparin IV 09/10/2022 and held again on 09/11/2022 after petechial hemorrhagic transformation Therapy recommendations: Pending Disposition:  Pending  Chronic Atrial Fibrillation  Home med: Eliquis Rate controlled Heparin IV- On hold- CT head 1/18 prior to resuming   Brain Compression Hypertonic saline at 49m/hr  Na 154  Congestive heart failure Alcoholic cardiomyopathy 80/7371EF 25 to 30%.  This admission EF 20 to 25%. Home meds including Imdur, digoxin, Jardiance, lasix On digoxin CCM managing Pt follows with Dr. MAundra Dubinas outpt  Hypertension Home meds:  Lasix, coreg, aldactone Stable BP goal < 180/105 Long-term BP goal normotensive  Post operative respiratory insufficiency  Remains intubated post procedure CCM to manage ventilator settings Hx of COPD Extubate as able  Hyperlipidemia Home meds:  Atorvastatin '80mg'$   LDL 56, goal < 70 AST/ALT 64/48-> 33/30 Resumed lipitor 80 Continue statin at discharge  Diabetes type II Controlled Home meds:  Jardiance, metformin HgbA1c 6.5, goal < 7.0 CBGs SSI  Other Stroke Risk Factors Advanced Age >/= 691 Hx stroke/TIA Evidence of chronic infarcts on imaging Coronary artery disease status post stenting patient  Other Active Problems Chronic BLE wound Chronic b/l foot drop Recent left hip fracture - walking with walker at home Leukopenia WBC 3.8->7.3->5.6 -> 3.8->5.4 Hypokalemia K 3.2 -> 3.7->3.6  Left upper extremity thrombophlebitis Febrile-Tmax 103F ID consulted Blood cultures for MSSA  On cefazolin   Hospital Day #9   Patient seen and examined by NP/APP with MD. MD to update note as needed.   Vincent Ores DNP, FNP-BC [facility] Pager: [phone] have personally obtained history,examined this patient, reviewed notes, independently viewed imaging studies, participated in medical decision making and plan of care.ROS completed by me personally and pertinent positives fully documented  I have made any additions or clarifications directly to the above note. Agree with note above.  Patient remains sedated and intubated for respiratory failure with neurological exam essentially is unchanged.  His IV heparin is on hold because of hemorrhagic transformation we will plan to repeat CT head tomorrow and if there is improvement we will resume heparin.  Continue hypertonic saline for now but will consider  tapering tomorrow if there is improvement on CT head.  Continue antibiotics for thrombophlebitis.  Long discussion with sister at the bedside yesterday along with his friend Vincent [name] over the phone about his prognosis and sister is dwelling on level of care and will make a decision soon but wants full support for now Discussed with Dr. Ander Slade critical care MD.This patient is critically ill and at significant risk of neurological worsening, death and care requires constant monitoring of vital signs, hemodynamics,respiratory and cardiac monitoring, extensive review of multiple databases, frequent neurological assessment, discussion with family, other specialists and medical decision making of high complexity.I have made any additions or clarifications directly to the above note.This critical care time does not reflect procedure time, or teaching time or supervisory time of PA/NP/Med Resident etc but could involve care discussion time.  I spent 30 minutes of neurocritical care time  in the care of  this patient.     Vincent Contras, MD Medical Director [facility] Pager: [phone] 09/14/2022 2:10 PM   After hours, contact General Neurology

## 2022-09-14 NOTE — Inpatient Diabetes Management (Signed)
Inpatient Diabetes Program Recommendations  AACE/ADA: New Consensus Statement on Inpatient Glycemic Control (2015)  Target Ranges:  Prepandial:   less than 140 mg/dL      Peak postprandial:   less than 180 mg/dL (1-2 hours)      Critically ill patients:  140 - 180 mg/dL   Lab Results  Component Value Date   GLUCAP 193 (H) 09/14/2022   HGBA1C 6.5 (H) 09/06/2022    Review of Glycemic Control  Latest Reference Range & Units 09/13/22 07:52 09/13/22 11:31 09/13/22 16:02 09/13/22 19:51 09/13/22 23:42 09/14/22 03:52 09/14/22 07:52  Glucose-Capillary 70 - 99 mg/dL 159 (H)  Novolog 7 units 142 (H)  Novolog 6 units 111 (H)  Novolog 4 units 178 (H)  Novolog 7 units 246 (H)  Novolog 9 units 246 (H)  Novolog 9 units 193 (H)  Novolog 7 units   Diabetes history: DM 2 Outpatient Diabetes medications: Jardiance 25 mg Daily, Metformin 500 mg bid Current orders for Inpatient glycemic control:  Novolog 0-15 units Q4 hours Novolog 4 units Q4 hours Tube Feed Coverage  Osmolite 50 ml/hour  Note a couple of elevations in a 24 hour period on current regimen. Would not have tight glucose control at this time.  Inpatient Diabetes Program Recommendations:    -   Increase Novolog Tube Feed coverage to 5 units Q4 hours  Thanks,  Tama Headings RN, MSN, BC-ADM Inpatient Diabetes Coordinator Team Pager [phone] (8a-5p)

## 2022-09-14 NOTE — Progress Notes (Signed)
SLP Cancellation Note  Patient Details Name: Vincent [name] MRN: [id number] DOB: June 25, 1936   Cancelled treatment:       Reason Eval/Treat Not Completed: Other (comment) (SLP has been following pt since orders were received on 1/8, but pt remains on the vent. SLP will sign off at this time, but please reconsult when/if clinically indicated.)  [name] I. Hardin Negus, [place], [place] Office number [phone]  Horton Marshall 09/14/2022, 8:03 AM

## 2022-09-14 NOTE — Progress Notes (Signed)
NAME:  Vincent [name], MRN:  [id number], DOB:  01-06-1936, LOS: 9 ADMISSION DATE:  09/14/2022, CONSULTATION DATE:  09/06/2022 REFERRING MD:  Marjory Lies - NIR CHIEF COMPLAINT:  S/p basilar artery occlusion, vent Management   History of Present Illness:  87 year old man who presented to [facility] ED 1/8 as a Code Stroke after being found down at home. Presented with R gaze preference, L-sided deficits and dysarthria. PMHx significant for HTN, HLD, AF (on Eliquis), CHF (EF 25-30%), CAD s/p stent (2004), prior CVA with residual L-sided deficits, COPD, T2DM.  CTA Head/Neck demonstrated distal basilar artery occlusion, left V4 occlusion. He was seen by neurology and IR and was taken to IR for revascularization of distal basilar artery; TICI 2C revascularization achieved. Post-procedure CT negative for hemorrhage.  Post-procedure, he was left intubated and was transferred to ICU.  PCCM was asked to see for vent management.  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 s/p remote stent to Cx 2004 with chronic stable angina in context of residual circumflex disease.   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 1/11 agitated with vent wean 1/12 less interactive. CT head with no acute findings. Failed SBT with tachypnea. Given lasix with 2L out  1/15 Repeat CT head (09/12/2022 05:41): Stable post-cerebellar infarction petechial hemorrhage (Heidelberg classification 1c: PH1), most apparent in the right cerebellar hemisphere. No significant posterior fossa mass effect or other complicating features. Recent bilateral cerebellar and brainstem infarcts with chronic supratentorial ischemia and no new intracranial abnormality identified.  1/15 started on hypertonic saline 1/16 family meeting with sister 1/16  Interim History / Subjective:  No overnight events Had a temperature 103 at 0800 hrs. 1/16  Objective:  Blood pressure 125/60, pulse 67, temperature 98.1 F (36.7 C), temperature source Oral, resp. rate (!) 23, height '5\' 9"'$  (1.753 m), weight 75.7 kg, SpO2 99 %.    Vent Mode: CPAP;PSV FiO2 (%):  [30 %] 30 % Set Rate:  [15 bmp] 15 bmp Vt Set:  [560 mL] 560 mL PEEP:  [5 cmH20] 5 cmH20 Pressure Support:  [10 cmH20] 10 cmH20 Plateau Pressure:  [15 cmH20-16 cmH20] 16 cmH20   Intake/Output Summary (Last 24 hours) at 09/14/2022 0928 Last data filed at 09/14/2022 0800 Gross per 24 hour  Intake 3077.72 ml  Output 2426 ml  Net 651.72 ml   Filed Weights   09/06/22 0200 09/06/22 0432  Weight: 84.2 kg 75.7 kg   General: Chronically ill-appearing HEENT: Endotracheal tube in place,  orogastric tube in place  neuro: He does appear to be more interactive today, Does not appear to be agitated CV: S1-S2 appreciated PULM: Coarse breath sounds GI: Bowel sounds appreciated GU: Foley in place Extremities: Skin is warm and dry Skin: no rashes or lesions  CT head reviewed Blood cultures with MSSA-on cefazolin -Repeat cultures 1/14-negative to date  Assessment & Plan:    Basilar artery occlusion, acute encephalopathy -S/p revascularization by IR -CT scan 1/15 does show petechial bleeding and cerebellar infarct -Anticoagulation was held -On aspirin 855 -Goal systolic pressure less than 180 -On Precedex -Continue neuroprotective measures including elevation of the head of the bed, normoglycemia, normothermia, continue to address electrolytes  Respiratory failure Continue full ventilator support -Appears to be tolerating weaning -Not an extubation candidate at present -Will continue to discuss with family, need to have a clear plan should he not tolerate extubation well -Size of his stroke is extensive and may not be able to protect his airway  History of chronic obstructive pulmonary disease -Continue full ventilator support -Plateau pressures less than 30 -Continue bronchial hygiene  History of atrial fibrillation was on Eliquis -Systolic congestive heart failure with ejection fraction of 25 to 30% -Repeat echo is unchanged  Hypertension Hyperlipidemia Heart failure with reduced ejection fraction -On spironolactone and Entresto -Imdur, Coreg on hold -Continue statins -Heparin held  Fever, no significant leukocytosis -Blood cultures with MSSA -On cefazolin -Today will be day 4 of cefazolin -Appreciate infectious disease following  Diabetes -Continue SSI  Cephalic vein thrombosis -Unable to anticoagulate at present  Goals of care discussions with family member ongoing Weaning 10/5 today  Best practice (evaluated daily):  Diet/type: TF DVT prophylaxis: SCD GI prophylaxis: PPI Lines: N/A Foley:  External foley in place  Code Status:  full code Last date of multidisciplinary goals of care discussion: Per primary  The patient is critically ill with multiple organ systems failure and requires high complexity decision making for assessment and support, frequent evaluation and titration of therapies, application of advanced  monitoring technologies and extensive interpretation of multiple databases. Critical Care Time devoted to patient care services described in this note independent of APP/resident time (if applicable)  is 31 minutes.   Sherrilyn Rist MD [facility] Personal pager: See Amion If unanswered, please page CCM On-call: [phone]

## 2022-09-14 NOTE — Progress Notes (Signed)
[facility]  Date of Admission:  09/22/2022     Total days of antibiotics 4         ASSESSMENT:  Vincent [name] appears to have defervesced overnight. Blood cultures from 1/14 and 1/16 are without growth. Remains ventilated and not following commands on exam. Goals of care meeting held yesterday with plan for full treatment for now with ongoing goals of care discussion. Source of fever remains unclear and will continue with Cefazolin q 6 for now although <24 hours after starting unlikely this made significant impact. Neurology plans for imaging tomorrow and continuation of hypertonic saline. Remaining medical and supportive care per PCCM.   PLAN:  Continue current dose of q 6 Cefazolin. Monitor cultures for clearance of bacteremia. Continue goals of care discussion with family updated from ID standpoint.  Remaining medical and supportive care per PCCM.   Principal Problem:   Status post surgery Active Problems:   Staphylococcus aureus bacteremia   Stroke (cerebrum) (HCC)   Basilar artery occlusion   Chronic respiratory failure with hypoxia (HCC)   Basilar artery obstruction   Malnutrition of moderate degree   Encephalopathy acute   Ventilator dependence (HCC)    aspirin  325 mg Per Tube Daily   atorvastatin  80 mg Per Tube Daily   brimonidine  1 drop Both Eyes BID   carvedilol  3.125 mg Per Tube BID   Chlorhexidine Gluconate Cloth  6 each Topical Q0600   docusate  100 mg Per Tube BID   feeding supplement (PROSource TF20)  60 mL Per Tube BID   furosemide  40 mg Intravenous Daily   insulin aspart  0-15 Units Subcutaneous Q4H   insulin aspart  5 Units Subcutaneous Q4H   mouth rinse  15 mL Mouth Rinse Q2H   pantoprazole (PROTONIX) IV  40 mg Intravenous Q24H   polyethylene glycol  17 g Per Tube Daily   sacubitril-valsartan  1 tablet Per Tube BID   spironolactone  25 mg Per Tube Daily    SUBJECTIVE:  Afebrile overnight with no acute events.  Remains intubated and not following commands. Family present for visit.   No Known Allergies   Review of Systems: Review of Systems  Unable to perform ROS: Intubated      OBJECTIVE: Vitals:   09/14/22 1200 09/14/22 1240 09/14/22 1300 09/14/22 1400  BP: (!) 126/55  (!) 111/50 (!) 130/53  Pulse: 67 75 64 79  Resp: (!) 27 (!) 30 (!) 29 (!) 33  Temp: 99.5 F (37.5 C)     TempSrc: Oral     SpO2: 100% 100% 99% 100%  Weight:      Height:       Body mass index is 24.65 kg/m.  Physical Exam Constitutional:      General: He is not in acute distress.    Appearance: He is well-developed.     Comments: Lying in bed with head of bed elevated; intubated  Cardiovascular:     Rate and Rhythm: Normal rate and regular rhythm.     Heart sounds: Normal heart sounds.  Pulmonary:     Effort: Pulmonary effort is normal.     Breath sounds: Normal breath sounds.  Skin:    General: Skin is warm and dry.  Neurological:     Mental Status: He is oriented to person, place, and time.     Lab Results Lab Results  Component Value Date   WBC 8.9 09/14/2022   HGB 10.2 (  L) 09/14/2022   HCT 33.7 (L) 09/14/2022   MCV 88.7 09/14/2022   PLT 149 (L) 09/14/2022    Lab Results  Component Value Date   CREATININE 1.03 09/14/2022   BUN 29 (H) 09/14/2022   NA 154 (H) 09/14/2022   K 3.6 09/14/2022   CL 117 (H) 09/14/2022   CO2 26 09/14/2022    Lab Results  Component Value Date   ALT 21 09/14/2022   AST 70 (H) 09/14/2022   ALKPHOS 94 09/14/2022   BILITOT 0.5 09/14/2022     Microbiology: Recent Results (from the past 240 hour(s))  Resp panel by RT-PCR (RSV, Flu A&B, Covid) Anterior Nasal Swab     Status: None   Collection Time: 09/08/2022  3:50 PM   Specimen: Anterior Nasal Swab  Result Value Ref Range Status   SARS Coronavirus 2 by RT PCR NEGATIVE NEGATIVE Final    Comment: (NOTE) SARS-CoV-2 target nucleic acids are NOT DETECTED.  The SARS-CoV-2 RNA is generally detectable in upper  respiratory specimens during the acute phase of infection. The lowest concentration of SARS-CoV-2 viral copies this assay can detect is 138 copies/mL. A negative result does not preclude SARS-Cov-2 infection and should not be used as the sole basis for treatment or other patient management decisions. A negative result may occur with  improper specimen collection/handling, submission of specimen other than nasopharyngeal swab, presence of viral mutation(s) within the areas targeted by this assay, and inadequate number of viral copies(<138 copies/mL). A negative result must be combined with clinical observations, patient history, and epidemiological information. The expected result is Negative.  Fact Sheet for Patients:  EntrepreneurPulse.com.au  Fact Sheet for Healthcare Providers:  IncredibleEmployment.be  This test is no t yet approved or cleared by the [place] FDA and  has been authorized for detection and/or diagnosis of SARS-CoV-2 by FDA under an Emergency Use Authorization (EUA). This EUA will remain  in effect (meaning this test can be used) for the duration of the COVID-19 declaration under Section 564(b)(1) of the Act, 21 U.S.C.section 360bbb-3(b)(1), unless the authorization is terminated  or revoked sooner.       Influenza A by PCR NEGATIVE NEGATIVE Final   Influenza B by PCR NEGATIVE NEGATIVE Final    Comment: (NOTE) The Xpert Xpress SARS-CoV-2/FLU/RSV plus assay is intended as an aid in the diagnosis of influenza from Nasopharyngeal swab specimens and should not be used as a sole basis for treatment. Nasal washings and aspirates are unacceptable for Xpert Xpress SARS-CoV-2/FLU/RSV testing.  Fact Sheet for Patients: EntrepreneurPulse.com.au  Fact Sheet for Healthcare Providers: IncredibleEmployment.be  This test is not yet approved or cleared by the [place] FDA and has been  authorized for detection and/or diagnosis of SARS-CoV-2 by FDA under an Emergency Use Authorization (EUA). This EUA will remain in effect (meaning this test can be used) for the duration of the COVID-19 declaration under Section 564(b)(1) of the Act, 21 U.S.C. section 360bbb-3(b)(1), unless the authorization is terminated or revoked.     Resp Syncytial Virus by PCR NEGATIVE NEGATIVE Final    Comment: (NOTE) Fact Sheet for Patients: EntrepreneurPulse.com.au  Fact Sheet for Healthcare Providers: IncredibleEmployment.be  This test is not yet approved or cleared by the [place] FDA and has been authorized for detection and/or diagnosis of SARS-CoV-2 by FDA under an Emergency Use Authorization (EUA). This EUA will remain in effect (meaning this test can be used) for the duration of the COVID-19 declaration under Section 564(b)(1) of the Act, 21 U.S.C.  section 360bbb-3(b)(1), unless the authorization is terminated or revoked.  Performed at [facility], [place] [address], [place], [place] [address]   MRSA Next Gen by PCR, Nasal     Status: None   Collection Time: 09/06/22  1:43 AM   Specimen: Nasal Mucosa; Nasal Swab  Result Value Ref Range Status   MRSA by PCR Next Gen NOT DETECTED NOT DETECTED Final    Comment: (NOTE) The GeneXpert MRSA Assay (FDA approved for NASAL specimens only), is one component of a comprehensive MRSA colonization surveillance program. It is not intended to diagnose MRSA infection nor to guide or monitor treatment for MRSA infections. Test performance is not FDA approved in patients less than 23 years old. Performed at [facility], [place] [address], [place], [place] [address]   Culture, Respiratory w Gram Stain     Status: None   Collection Time: 09/08/22 11:02 AM   Specimen: Tracheal Aspirate; Respiratory  Result Value Ref Range Status   Specimen Description TRACHEAL ASPIRATE  Final   Special Requests  NONE  Final   Gram Stain   Final    NO WBC SEEN FEW GRAM POSITIVE COCCI IN CHAINS FEW GRAM NEGATIVE RODS RARE GRAM POSITIVE RODS    Culture   Final    ABUNDANT Normal respiratory flora-no Staph aureus or Pseudomonas seen Performed at [facility], [address] [place], [place] [address]    Report Status 09/10/2022 FINAL  Final  Culture, Respiratory w Gram Stain     Status: None (Preliminary result)   Collection Time: 09/10/22  5:48 PM   Specimen: Tracheal Aspirate; Respiratory  Result Value Ref Range Status   Specimen Description TRACHEAL ASPIRATE  Final   Special Requests NONE  Final   Gram Stain   Final    ABUNDANT WBC PRESENT, PREDOMINANTLY PMN FEW GRAM POSITIVE RODS RARE GRAM POSITIVE COCCI IN SINGLES IN PAIRS FEW GRAM NEGATIVE RODS    Culture   Final    FEW ENTEROBACTER GERGOVIAE ABUNDANT STREPTOCOCCUS CONSTELLATUS SUSCEPTIBILITIES TO FOLLOW Performed at [facility], [place] [address], [place], [place] [address]    Report Status PENDING  Incomplete   Organism ID, Bacteria ENTEROBACTER GERGOVIAE  Final      Susceptibility   Enterobacter gergoviae - MIC*    CEFAZOLIN <=4 SENSITIVE Sensitive     CEFEPIME <=0.12 SENSITIVE Sensitive     CEFTAZIDIME <=1 SENSITIVE Sensitive     CEFTRIAXONE <=0.25 SENSITIVE Sensitive     CIPROFLOXACIN <=0.25 SENSITIVE Sensitive     GENTAMICIN <=1 SENSITIVE Sensitive     IMIPENEM <=0.25 SENSITIVE Sensitive     TRIMETH/SULFA <=20 SENSITIVE Sensitive     PIP/TAZO <=4 SENSITIVE Sensitive     * FEW ENTEROBACTER GERGOVIAE  Culture, blood (Routine X 2) w Reflex to ID Panel     Status: Abnormal   Collection Time: 09/10/22  6:06 PM   Specimen: BLOOD  Result Value Ref Range Status   Specimen Description BLOOD SITE NOT SPECIFIED  Final   Special Requests   Final    BOTTLES DRAWN AEROBIC AND ANAEROBIC Blood Culture adequate volume   Culture  Setup Time   Final    GRAM POSITIVE COCCI IN CLUSTERS IN BOTH AEROBIC AND ANAEROBIC  BOTTLES CRITICAL RESULT CALLED TO, READ BACK BY AND VERIFIED WITH: PHARMD L.BELL AT 1206 ON 09/11/2022 BY T.SAAD. Performed at [facility], [place] [address], [place], [place] [address]    Culture STAPHYLOCOCCUS AUREUS (A)  Final   Report Status 09/13/2022 FINAL  Final   Organism ID, Bacteria STAPHYLOCOCCUS AUREUS  Final      Susceptibility   Staphylococcus aureus - MIC*    CIPROFLOXACIN <=0.5 SENSITIVE Sensitive     ERYTHROMYCIN <=0.25 SENSITIVE Sensitive     GENTAMICIN <=0.5 SENSITIVE Sensitive     OXACILLIN <=0.25 SENSITIVE Sensitive     TETRACYCLINE <=1 SENSITIVE Sensitive     VANCOMYCIN 1 SENSITIVE Sensitive     TRIMETH/SULFA <=10 SENSITIVE Sensitive     CLINDAMYCIN <=0.25 SENSITIVE Sensitive     RIFAMPIN <=0.5 SENSITIVE Sensitive     Inducible Clindamycin NEGATIVE Sensitive     * STAPHYLOCOCCUS AUREUS  Culture, blood (Routine X 2) w Reflex to ID Panel     Status: Abnormal   Collection Time: 09/10/22  6:06 PM   Specimen: BLOOD  Result Value Ref Range Status   Specimen Description BLOOD SITE NOT SPECIFIED  Final   Special Requests   Final    BOTTLES DRAWN AEROBIC AND ANAEROBIC Blood Culture adequate volume   Culture  Setup Time   Final    GRAM POSITIVE COCCI IN CLUSTERS IN BOTH AEROBIC AND ANAEROBIC BOTTLES CRITICAL VALUE NOTED.  VALUE IS CONSISTENT WITH PREVIOUSLY REPORTED AND CALLED VALUE.    Culture (A)  Final    STAPHYLOCOCCUS AUREUS SUSCEPTIBILITIES PERFORMED ON PREVIOUS CULTURE WITHIN THE LAST 5 DAYS. Performed at [facility], [place] [address] [place], [place] [address]    Report Status 09/13/2022 FINAL  Final  Blood Culture ID Panel (Reflexed)     Status: Abnormal   Collection Time: 09/10/22  6:06 PM  Result Value Ref Range Status   Enterococcus faecalis NOT DETECTED NOT DETECTED Final   Enterococcus Faecium NOT DETECTED NOT DETECTED Final   Listeria monocytogenes NOT DETECTED NOT DETECTED Final   Staphylococcus species DETECTED (A) NOT DETECTED Final     Comment: CRITICAL RESULT CALLED TO, READ BACK BY AND VERIFIED WITH: PHARMD L.BELL AT 1206 ON 09/11/2022 BY T.SAAD.    Staphylococcus aureus (BCID) DETECTED (A) NOT DETECTED Final    Comment: CRITICAL RESULT CALLED TO, READ BACK BY AND VERIFIED WITH: PHARMD L.BELL AT 1206 ON 09/11/2022 BY T.SAAD.    Staphylococcus epidermidis NOT DETECTED NOT DETECTED Final   Staphylococcus lugdunensis NOT DETECTED NOT DETECTED Final   Streptococcus species NOT DETECTED NOT DETECTED Final   Streptococcus agalactiae NOT DETECTED NOT DETECTED Final   Streptococcus pneumoniae NOT DETECTED NOT DETECTED Final   Streptococcus pyogenes NOT DETECTED NOT DETECTED Final   A.calcoaceticus-baumannii NOT DETECTED NOT DETECTED Final   Bacteroides fragilis NOT DETECTED NOT DETECTED Final   Enterobacterales NOT DETECTED NOT DETECTED Final   Enterobacter cloacae complex NOT DETECTED NOT DETECTED Final   Escherichia coli NOT DETECTED NOT DETECTED Final   Klebsiella aerogenes NOT DETECTED NOT DETECTED Final   Klebsiella oxytoca NOT DETECTED NOT DETECTED Final   Klebsiella pneumoniae NOT DETECTED NOT DETECTED Final   Proteus species NOT DETECTED NOT DETECTED Final   Salmonella species NOT DETECTED NOT DETECTED Final   Serratia marcescens NOT DETECTED NOT DETECTED Final   Haemophilus influenzae NOT DETECTED NOT DETECTED Final   Neisseria meningitidis NOT DETECTED NOT DETECTED Final   Pseudomonas aeruginosa NOT DETECTED NOT DETECTED Final   Stenotrophomonas maltophilia NOT DETECTED NOT DETECTED Final   Candida albicans NOT DETECTED NOT DETECTED Final   Candida auris NOT DETECTED NOT DETECTED Final   Candida glabrata NOT DETECTED NOT DETECTED Final   Candida krusei NOT DETECTED  NOT DETECTED Final   Candida parapsilosis NOT DETECTED NOT DETECTED Final   Candida tropicalis NOT DETECTED NOT DETECTED Final   Cryptococcus neoformans/gattii NOT DETECTED NOT DETECTED Final   Meth resistant mecA/C and MREJ NOT DETECTED NOT  DETECTED Final    Comment: Performed at [facility], [address], [place], [place] [address]  Respiratory (~20 pathogens) panel by PCR     Status: None   Collection Time: 09/11/22  1:06 PM   Specimen: Nasopharyngeal Swab; Respiratory  Result Value Ref Range Status   Adenovirus NOT DETECTED NOT DETECTED Final   Coronavirus 229E NOT DETECTED NOT DETECTED Final    Comment: (NOTE) The Coronavirus on the Respiratory Panel, DOES NOT test for the novel  Coronavirus (2019 nCoV)    Coronavirus HKU1 NOT DETECTED NOT DETECTED Final   Coronavirus NL63 NOT DETECTED NOT DETECTED Final   Coronavirus OC43 NOT DETECTED NOT DETECTED Final   Metapneumovirus NOT DETECTED NOT DETECTED Final   Rhinovirus / Enterovirus NOT DETECTED NOT DETECTED Final   Influenza A NOT DETECTED NOT DETECTED Final   Influenza B NOT DETECTED NOT DETECTED Final   Parainfluenza Virus 1 NOT DETECTED NOT DETECTED Final   Parainfluenza Virus 2 NOT DETECTED NOT DETECTED Final   Parainfluenza Virus 3 NOT DETECTED NOT DETECTED Final   Parainfluenza Virus 4 NOT DETECTED NOT DETECTED Final   Respiratory Syncytial Virus NOT DETECTED NOT DETECTED Final   Bordetella pertussis NOT DETECTED NOT DETECTED Final   Bordetella Parapertussis NOT DETECTED NOT DETECTED Final   Chlamydophila pneumoniae NOT DETECTED NOT DETECTED Final   Mycoplasma pneumoniae NOT DETECTED NOT DETECTED Final    Comment: Performed at [facility], [place]. [address], [place], [place] [address]  Culture, blood (Routine X 2) w Reflex to ID Panel     Status: None (Preliminary result)   Collection Time: 09/11/22  5:46 PM   Specimen: BLOOD  Result Value Ref Range Status   Specimen Description BLOOD SITE NOT SPECIFIED  Final   Special Requests   Final    BOTTLES DRAWN AEROBIC ONLY Blood Culture results may not be optimal due to an inadequate volume of blood received in culture bottles   Culture   Final    NO GROWTH 2 DAYS Performed at [facility],  [place] [address], [place], [place] [address]    Report Status PENDING  Incomplete  Culture, blood (Routine X 2) w Reflex to ID Panel     Status: None (Preliminary result)   Collection Time: 09/13/22 11:09 AM   Specimen: BLOOD RIGHT ARM  Result Value Ref Range Status   Specimen Description BLOOD RIGHT ARM  Final   Special Requests   Final    BOTTLES DRAWN AEROBIC AND ANAEROBIC Blood Culture adequate volume   Culture   Final    NO GROWTH <12 HOURS Performed at [facility], [place] [address], [place], [place] [address]    Report Status PENDING  Incomplete  Culture, blood (Routine X 2) w Reflex to ID Panel     Status: None (Preliminary result)   Collection Time: 09/13/22 11:11 AM   Specimen: BLOOD RIGHT ARM  Result Value Ref Range Status   Specimen Description BLOOD RIGHT ARM  Final   Special Requests   Final    BOTTLES DRAWN AEROBIC AND ANAEROBIC Blood Culture adequate volume   Culture   Final    NO GROWTH <12 HOURS Performed at [facility], [place]  [address] [place], [place] [address]    Report Status PENDING  Incomplete     Terri Piedra, [facility]  09/14/2022  2:45 PM

## 2022-09-15 ENCOUNTER — Inpatient Hospital Stay (HOSPITAL_COMMUNITY): Payer: Medicare Other

## 2022-09-15 DIAGNOSIS — Z9889 Other specified postprocedural states: Secondary | ICD-10-CM | POA: Diagnosis not present

## 2022-09-15 DIAGNOSIS — I639 Cerebral infarction, unspecified: Secondary | ICD-10-CM | POA: Diagnosis not present

## 2022-09-15 LAB — COMPREHENSIVE METABOLIC PANEL
ALT: 35 U/L (ref 0–44)
AST: 147 U/L — ABNORMAL HIGH (ref 15–41)
Albumin: 1.8 g/dL — ABNORMAL LOW (ref 3.5–5.0)
Alkaline Phosphatase: 107 U/L (ref 38–126)
Anion gap: 3 — ABNORMAL LOW (ref 5–15)
BUN: 28 mg/dL — ABNORMAL HIGH (ref 8–23)
CO2: 32 mmol/L (ref 22–32)
Calcium: 8 mg/dL — ABNORMAL LOW (ref 8.9–10.3)
Chloride: 121 mmol/L — ABNORMAL HIGH (ref 98–111)
Creatinine, Ser: 1.09 mg/dL (ref 0.61–1.24)
GFR, Estimated: >60 mL/min (ref >60)
Glucose, Bld: 238 mg/dL — ABNORMAL HIGH (ref 70–99)
Potassium: 3.3 mmol/L — ABNORMAL LOW (ref 3.5–5.1)
Sodium: 156 mmol/L — ABNORMAL HIGH (ref 135–145)
Total Bilirubin: 0.4 mg/dL (ref 0.3–1.2)
Total Protein: 6.1 g/dL — ABNORMAL LOW (ref 6.5–8.1)

## 2022-09-15 LAB — CBC WITH DIFFERENTIAL/PLATELET
Abs Immature Granulocytes: 0.09 10*3/uL — ABNORMAL HIGH (ref 0.00–0.07)
Basophils Absolute: 0 10*3/uL (ref 0.0–0.1)
Basophils Relative: 0 %
Eosinophils Absolute: 0 10*3/uL (ref 0.0–0.5)
Eosinophils Relative: 0 %
HCT: 30.3 % — ABNORMAL LOW (ref 39.0–52.0)
Hemoglobin: 9.2 g/dL — ABNORMAL LOW (ref 13.0–17.0)
Immature Granulocytes: 1 %
Lymphocytes Relative: 4 %
Lymphs Abs: 0.4 10*3/uL — ABNORMAL LOW (ref 0.7–4.0)
MCH: 27.1 pg (ref 26.0–34.0)
MCHC: 30.4 g/dL (ref 30.0–36.0)
MCV: 89.4 fL (ref 80.0–100.0)
Monocytes Absolute: 0.6 10*3/uL (ref 0.1–1.0)
Monocytes Relative: 6 %
Neutro Abs: 8.7 10*3/uL — ABNORMAL HIGH (ref 1.7–7.7)
Neutrophils Relative %: 89 %
Platelets: 198 10*3/uL (ref 150–400)
RBC: 3.39 MIL/uL — ABNORMAL LOW (ref 4.22–5.81)
RDW: 18.3 % — ABNORMAL HIGH (ref 11.5–15.5)
WBC: 9.8 10*3/uL (ref 4.0–10.5)
nRBC: 0 % (ref 0.0–0.2)

## 2022-09-15 LAB — SODIUM
Sodium: 159 mmol/L — ABNORMAL HIGH (ref 135–145)
Sodium: 155 mmol/L — ABNORMAL HIGH (ref 135–145)
Sodium: 158 mmol/L — ABNORMAL HIGH (ref 135–145)

## 2022-09-15 LAB — GLUCOSE, CAPILLARY
Glucose-Capillary: 158 mg/dL — ABNORMAL HIGH (ref 70–99)
Glucose-Capillary: 176 mg/dL — ABNORMAL HIGH (ref 70–99)
Glucose-Capillary: 185 mg/dL — ABNORMAL HIGH (ref 70–99)
Glucose-Capillary: 197 mg/dL — ABNORMAL HIGH (ref 70–99)
Glucose-Capillary: 220 mg/dL — ABNORMAL HIGH (ref 70–99)
Glucose-Capillary: 246 mg/dL — ABNORMAL HIGH (ref 70–99)

## 2022-09-15 LAB — PHOSPHORUS: Phosphorus: 2.4 mg/dL — ABNORMAL LOW (ref 2.5–4.6)

## 2022-09-15 LAB — MAGNESIUM: Magnesium: 2.2 mg/dL (ref 1.7–2.4)

## 2022-09-15 MED ORDER — INSULIN ASPART 100 UNIT/ML IJ SOLN
7.0000 [IU] | INTRAMUSCULAR | Status: DC
  Administered 2022-09-15 – 2022-09-16 (×6): 7 [IU] via SUBCUTANEOUS

## 2022-09-15 MED ORDER — POTASSIUM CHLORIDE 20 MEQ PO PACK
20.0000 meq | PACK | ORAL | Status: AC
  Administered 2022-09-15 (×2): 20 meq
  Filled 2022-09-15 (×2): qty 1

## 2022-09-15 MED ORDER — POTASSIUM CHLORIDE 10 MEQ/100ML IV SOLN
10.0000 meq | INTRAVENOUS | Status: AC
  Administered 2022-09-15 (×4): 10 meq via INTRAVENOUS
  Filled 2022-09-15 (×4): qty 100

## 2022-09-15 NOTE — Progress Notes (Addendum)
STROKE TEAM PROGRESS NOTE   INTERVAL HISTORY Pt still intubated and on vent for respiratory failure, low dose sedation with Precedex   No family at the bedside.  Drowsy today and not much movement in extremities but does nod head and wiggle fingers.     Patient is awake and nods his head appropriately and when asked wether he would want reintubation, trach and peg shook his head to indicate no Repeat Head CT this morning-  Extensive recent infratentorial infarcts with petechial hemorrhage at the right lower cerebellum. The fourth ventricle remains patent. Large remote right MCA distribution infarct.  Vitals:   09/15/22 1400 09/15/22 1455 09/15/22 1500 09/15/22 1538  BP: (!) 106/57  111/65   Pulse: 67  68   Resp: (!) 23  (!) 24   Temp:    98.5 F (36.9 C)  TempSrc:    Axillary  SpO2: 95% 95% 98%   Weight:      Height:       CBC:  Recent Labs  Lab 09/14/22 0112 09/15/22 0311  WBC 8.9 9.8  NEUTROABS 7.4 8.7*  HGB 10.2* 9.2*  HCT 33.7* 30.3*  MCV 88.7 89.4  PLT 149* 536   Basic Metabolic Panel:  Recent Labs  Lab 09/14/22 0112 09/14/22 0917 09/15/22 0311 09/15/22 0915 09/15/22 1523  NA 153*   < > 156* 159* 155*  K 3.6  --  3.3*  --   --   CL 117*  --  121*  --   --   CO2 26  --  32  --   --   GLUCOSE 285*  --  238*  --   --   BUN 29*  --  28*  --   --   CREATININE 1.03  --  1.09  --   --   CALCIUM 7.7*  --  8.0*  --   --   MG 2.1  --  2.2  --   --   PHOS 1.9*  --  2.4*  --   --    < > = values in this interval not displayed.   Lipid Panel:  No results for input(s): "CHOL", "TRIG", "HDL", "CHOLHDL", "VLDL", "LDLCALC" in the last 168 hours.  HgbA1c:  No results for input(s): "HGBA1C" in the last 168 hours.  Urine Drug Screen: No results for input(s): "LABOPIA", "COCAINSCRNUR", "LABBENZ", "AMPHETMU", "THCU", "LABBARB" in the last 168 hours.  Alcohol Level No results for input(s): "ETH" in the last 168 hours.  IMAGING past 24 hours CT HEAD WO CONTRAST  (5MM)  Result Date: 09/15/2022 CLINICAL DATA:  Stroke follow-up EXAM: CT HEAD WITHOUT CONTRAST TECHNIQUE: Contiguous axial images were obtained from the base of the skull through the vertex without intravenous contrast. RADIATION DOSE REDUCTION: This exam was performed according to the departmental dose-optimization program which includes automated exposure control, adjustment of the mA and/or kV according to patient size and/or use of iterative reconstruction technique. COMPARISON:  Three days ago FINDINGS: Brain: Extensive recent infarction in the right more than left cerebellum and in the pons with petechial hemorrhage in the inferior right cerebellum. No interval progression. Narrowing of the fourth ventricle but no hydrocephalus. Remote right MCA distribution infarct with most dense encephalomalacia at the posterior division. Generalized brain atrophy. No mass or collection. Vascular: No hyperdense vessel or unexpected calcification. Skull: Normal. Negative for fracture or focal lesion. Sinuses/Orbits: No acute finding in the setting of intubation IMPRESSION: 1. Extensive recent infratentorial infarcts with petechial hemorrhage at the  right lower cerebellum. The fourth ventricle remains patent. 2. Large remote right MCA distribution infarct. Electronically Signed   By: Jorje Guild M.D.   On: 09/15/2022 04:39    PHYSICAL EXAM  General - Well nourished, well developed elderly African-American male intubated off sedation.   Ophthalmologic - fundi not visualized due to noncooperation.   Cardiovascular - irregularly irregular heart rate and rhythm.   Neuro - intubated off sedation, drowsy but eyes open on voice, following simple commands. Eyes in more left gaze preference position, slowly tracking to right but not complete right gaze, pupils 50m and reactive to light. Corneal reflex present, gag and cough present. Breathing over the vent.   Facial symmetry not able to test due to ET tube.  Tongue  protrusion not cooperative. Follows simple commands and against gravity BUEs left greater than right, wiggle toes on b/l feet. DTR 1+ and no babinski. Sensation, coordination not cooperative and gait not tested.     ASSESSMENT/PLAN Mr. Vincent GREENSLADEis a 87y.o. male with history of  alcoholic cardiomyopathy, permanent atrial fibrillation on Eliquis, CHF with an EF 25 to 30% with global hypokinesis, CAD s/p stenting, DM2, prior stroke, chronic bilateral lower extremity wounds with edema, LLE peroneal neuropathy with chronic foot drop, COPD, HLD and HTN who presented to the ED via EMS this afternoon after he was found down.  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  CTA head & neck Occlusion of the left V4 segment and poor opacification of the basilar artery with apparent occlusion distally, age indeterminate. The bilateral PCAs are supplied by posterior communicating arteries with likely retrograde flow into the P1 segments.Multifocal high-grade stenosis of the right M2 and M3 branches   S/p IR with TICI2c reperfusion MRI - Multiple areas of acute infarct involving the cerebellum bilaterally, left pons, and right frontal white matter. 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 Large area of chronic infarct and mild chronic hemorrhage and in the right MCA territory.  MRA  - Basilar artery is patent now.   1/11- CT repeat - no hydrocephalus  1/12- CT repeat - no hydrocephalus CT Head 1/18-  Extensive recent infratentorial infarcts with petechial hemorrhage at the right lower cerebellum. The fourth ventricle remains patent. Large remote right MCA distribution infarct. 2D Echo EF 20-25%  LDL 56 HgbA1c 6.5 VTE prophylaxis - Heparin subq Eliquis (apixaban) daily prior to  admission,   resumed AC with heparin IV 09/10/2022 and held again on 09/11/2022 after petechial hemorrhagic transformation Therapy recommendations: Pending Disposition:  Pending  Chronic Atrial Fibrillation  Home med: Eliquis Rate controlled Heparin IV- On hold- CT head 1/18 prior to resuming   Brain Compression Hypertonic saline discontinued 1/18 Na 157  Congestive heart failure Alcoholic cardiomyopathy 86/0454EF 25 to 30%.  This admission EF 20 to 25%. Home meds including Imdur, digoxin, Jardiance, lasix On digoxin CCM managing Pt follows with Dr. MAundra Dubinas outpt  Hypertension Home meds:  Lasix, coreg, aldactone Stable BP goal < 180/105 Long-term BP goal normotensive  Post operative respiratory insufficiency  Remains intubated post procedure CCM to manage ventilator settings Hx of COPD Extubate as able  Hyperlipidemia Home meds:  Atorvastatin '80mg'$   LDL 56, goal < 70 AST/ALT 64/48-> 33/30 Resumed lipitor 80 Continue statin at  discharge  Diabetes type II Controlled Home meds:  Jardiance, metformin HgbA1c 6.5, goal < 7.0 CBGs SSI  Other Stroke Risk Factors Advanced Age >/= 67  Hx stroke/TIA Evidence of chronic infarcts on imaging Coronary artery disease status post stenting patient  Other Active Problems Chronic BLE wound Chronic b/l foot drop Recent left hip fracture - walking with walker at home Leukopenia WBC 3.8->7.3->5.6 -> 3.8->5.4 Hypokalemia K 3.2 -> 3.7->3.6  Left upper extremity thrombophlebitis Febrile-Tmax 103F ID consulted Blood cultures for MSSA  On cefazolin   Hospital Day #10   Patient seen and examined by NP/APP with MD. MD to update note as needed.   Janine Ores, DNP, FNP-BC [facility] Pager: [phone]  I have personally obtained history,examined this patient, reviewed notes, independently viewed imaging studies, participated in medical decision making and plan of care.ROS completed by me personally and  pertinent positives fully documented  I have made any additions or clarifications directly to the above note. Agree with note above. Plan resume iv heparin. Patient indicate she does not want prolonged ventilatory support and reintubation and trach and will d/w sister and decide on extubation This patient is critically ill and at significant risk of neurological worsening, death and care requires constant monitoring of vital signs, hemodynamics,respiratory and cardiac monitoring, extensive review of multiple databases, frequent neurological assessment, discussion with family, other specialists and medical decision making of high complexity.I have made any additions or clarifications directly to the above note.This critical care time does not reflect procedure time, or teaching time or supervisory time of PA/NP/Med Resident etc but could involve care discussion time.  I spent 30 minutes of neurocritical care time  in the care of  this patient.   Antony Contras, MD Medical Director [facility] St[facility] Pager: [phone] 09/15/2022 4:06 PM   After hours, contact General Neurology

## 2022-09-15 NOTE — Progress Notes (Signed)
RN called d/t pt w/ increased RR.  Pt was placed back on full vent support.

## 2022-09-15 NOTE — Progress Notes (Signed)
[facility] ADULT ICU REPLACEMENT PROTOCOL   The patient does apply for the [facility] Adult ICU Electrolyte Replacment Protocol based on the criteria listed below:   1.Exclusion criteria: TCTS, ECMO, Dialysis, and Myasthenia Gravis patients 2. Is GFR >/= 30 ml/min? Yes.    Patient's GFR today is >60 3. Is SCr </= 2? Yes.   Patient's SCr is 1.09 mg/dL 4. Did SCr increase >/= 0.5 in 24 hours? No. 5.Pt's weight >40kg  Yes.   6. Abnormal electrolyte(s): potassium 3.3  7. Electrolytes replaced per protocol 8.  Call MD STAT for K+ </= 2.5, Phos </= 1, or Mag </= 1 Physician:  n/a  Vincent [name] 09/15/2022 6:15 AM

## 2022-09-15 NOTE — Progress Notes (Signed)
Sodium above goal 157 and hypertonic saline is held. Will resume if and when it trends down.  [place] Pager Number [phone]

## 2022-09-15 NOTE — Progress Notes (Signed)
Transported Pt to Ct and back with RN. No complications and stable vitals.

## 2022-09-15 NOTE — Progress Notes (Signed)
NAME:  Vincent [name], MRN:  [id number], DOB:  04/14/1936, LOS: 19 ADMISSION DATE:  09/14/2022, CONSULTATION DATE:  09/06/2022 REFERRING MD:  Marjory Lies - NIR CHIEF COMPLAINT:  S/p basilar artery occlusion, vent Management   History of Present Illness:  87 year old man who presented to [facility] ED 1/8 as a Code Stroke after being found down at home. Presented with R gaze preference, L-sided deficits and dysarthria. PMHx significant for HTN, HLD, AF (on Eliquis), CHF (EF 25-30%), CAD s/p stent (2004), prior CVA with residual L-sided deficits, COPD, T2DM.  CTA Head/Neck demonstrated distal basilar artery occlusion, left V4 occlusion. He was seen by neurology and IR and was taken to IR for revascularization of distal basilar artery; TICI 2C revascularization achieved. Post-procedure CT negative for hemorrhage.  Post-procedure, he was left intubated and was transferred to ICU.  PCCM was asked to see for vent management.  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 s/p remote stent to Cx 2004 with chronic stable angina in context of residual circumflex disease.   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 1/11 agitated with vent wean 1/12 less interactive. CT head with no acute findings. Failed SBT with tachypnea. Given lasix with 2L out  1/15 Repeat CT head (09/12/2022 05:41): Stable post-cerebellar infarction petechial hemorrhage (Heidelberg classification 1c: PH1), most apparent in the right cerebellar hemisphere. No significant posterior fossa mass effect or other complicating features. Recent bilateral cerebellar and brainstem infarcts with chronic supratentorial ischemia and no new intracranial abnormality identified.  1/15 started on hypertonic saline 1/16 family meeting with sister 1/16  Interim History / Subjective:  No overnight events More awake and interactive this morning  Objective:  Blood pressure 124/72, pulse 61, temperature 99.4 F (37.4 C), temperature source Axillary, resp. rate (!) 0, height '5\' 9"'$  (1.753 m), weight 66.7 kg, SpO2 97 %.    Vent Mode: PRVC FiO2 (%):  [30 %] 30 % Set Rate:  [15 bmp] 15 bmp Vt Set:  [560 mL] 560 mL PEEP:  [5 cmH20] 5 cmH20 Pressure Support:  [10 cmH20] 10 cmH20 Plateau Pressure:  [16 cmH20-18 cmH20] 18 cmH20   Intake/Output Summary (Last 24 hours) at 09/15/2022 0739 Last data filed at 09/15/2022 0700 Gross per 24 hour  Intake 3461.02 ml  Output 2890 ml  Net 571.02 ml   Filed Weights   09/06/22 0200 09/06/22 0432 09/15/22 0500  Weight: 84.2 kg 75.7 kg 66.7 kg   General: Chronically ill-appearing HEENT: Endotracheal tube  in place, orogastric tube in place neuro: More interactive and responsive, able to squeeze hand bilateral upper extremity, moving left lower extremity  CV: S1-S2 appreciated PULM: Coarse breath sounds bilaterally GI: Bowel sounds appreciated GU: Foley in place Extremities: Skin is warm and dry Skin: No skin rashes  CT head reviewed Blood cultures with MSSA-on  cefazolin -Repeat cultures 1/14-negative to date  Sodium 156, potassium 3.3, BUN 28, creatinine 1.09 WBC 9.8, hematocrit 30.3  Assessment & Plan:   Basilar artery occlusion, acute encephalopathy -S/p revascularization by IR -CT scan 1/15 shows petechial bleeding and cerebellar infarcts -Anticoagulation has been on hold -Remains on aspirin -Remains on Precedex -Continue neuroprotective measures  Respiratory failure Continue full ventilator support -Tolerating weaning well -Continued discussion with family regarding goals of care -Size of his stroke is extensive and may not be able to protect his airway  History of chronic obstructive pulmonary disease -Continue full ventilator support -Plateau pressures less than 30 -Continue bronchial hygiene  History of atrial fibrillation was on Eliquis -Systolic congestive heart failure with ejection fraction of 25 to 30% -Repeat echo is unchanged  Hypertension Hyperlipidemia Heart failure with reduced ejection fraction -On spironolactone and Entresto -Imdur and Coreg on hold -Continue statins -Heparin held  MSSA bacteremia -On cefazolin day 5-plan is for 4 weeks of cefazolin  Diabetes -Continue SSI  Cephalic vein thrombosis -Unable to anticoagulate  Ongoing goals of care discussion  Patient is more aware and interactive today I did ask him about being on the ventilator long-term and he does not want to be on the ventilator long-term He did indicate that if we get him off the ventilator, does not want to be back on the ventilator even if it means he is going to die-conversation was in the presence of his nurse and the respiratory therapist This was confirmed by Dr. Leonie Man- when he evaluated the patient as well  Best practice (evaluated daily):  Diet/type: TF DVT prophylaxis: SCD GI prophylaxis: PPI Lines: N/A Foley:  External foley in place  Code Status:  full code Last date of multidisciplinary goals of care  discussion: Per primary  The patient is critically ill with multiple organ systems failure and requires high complexity decision making for assessment and support, frequent evaluation and titration of therapies, application of advanced monitoring technologies and extensive interpretation of multiple databases. Critical Care Time devoted to patient care services described in this note independent of APP/resident time (if applicable)  is 35 minutes.   Sherrilyn Rist MD [place] [facility] Personal pager: See Amion If unanswered, please page CCM On-call: [phone]

## 2022-09-15 NOTE — Progress Notes (Signed)
Updated patients sister at bedside  Patient maintains he does want to be taken off the ventilator  Patients sister was advised to speak with him regarding his decision and she advised [place] that he is confused.  I did let her know that he seemed very appropriate this morning with me, his nurse, the therapist and Dr Leonie Man this morning about not wanting the tube to be replaced  Appropriate to allow more time for consensus as he may not do very well with his extensive stroke

## 2022-09-16 DIAGNOSIS — Z7189 Other specified counseling: Secondary | ICD-10-CM

## 2022-09-16 DIAGNOSIS — B9561 Methicillin susceptible Staphylococcus aureus infection as the cause of diseases classified elsewhere: Secondary | ICD-10-CM | POA: Diagnosis not present

## 2022-09-16 DIAGNOSIS — Z9889 Other specified postprocedural states: Secondary | ICD-10-CM | POA: Diagnosis not present

## 2022-09-16 DIAGNOSIS — Z515 Encounter for palliative care: Secondary | ICD-10-CM

## 2022-09-16 DIAGNOSIS — R7881 Bacteremia: Secondary | ICD-10-CM | POA: Diagnosis not present

## 2022-09-16 LAB — CULTURE, BLOOD (ROUTINE X 2): Culture: NO GROWTH

## 2022-09-16 LAB — GLUCOSE, CAPILLARY
Glucose-Capillary: 161 mg/dL — ABNORMAL HIGH (ref 70–99)
Glucose-Capillary: 168 mg/dL — ABNORMAL HIGH (ref 70–99)
Glucose-Capillary: 185 mg/dL — ABNORMAL HIGH (ref 70–99)
Glucose-Capillary: 210 mg/dL — ABNORMAL HIGH (ref 70–99)
Glucose-Capillary: 213 mg/dL — ABNORMAL HIGH (ref 70–99)
Glucose-Capillary: 78 mg/dL (ref 70–99)

## 2022-09-16 LAB — SODIUM: Sodium: 157 mmol/L — ABNORMAL HIGH (ref 135–145)

## 2022-09-16 LAB — HEPARIN LEVEL (UNFRACTIONATED): Heparin Unfractionated: 0.24 IU/mL — ABNORMAL LOW (ref 0.30–0.70)

## 2022-09-16 MED ORDER — CEFAZOLIN SODIUM-DEXTROSE 2-4 GM/100ML-% IV SOLN
2.0000 g | Freq: Four times a day (QID) | INTRAVENOUS | Status: DC
  Administered 2022-09-16 – 2022-09-19 (×13): 2 g via INTRAVENOUS
  Filled 2022-09-16 (×12): qty 100

## 2022-09-16 MED ORDER — INSULIN ASPART 100 UNIT/ML IJ SOLN
8.0000 [IU] | INTRAMUSCULAR | Status: DC
  Administered 2022-09-16 – 2022-09-18 (×12): 8 [IU] via SUBCUTANEOUS

## 2022-09-16 MED ORDER — INSULIN ASPART 100 UNIT/ML IJ SOLN
0.0000 [IU] | INTRAMUSCULAR | Status: DC
  Administered 2022-09-16 – 2022-09-18 (×9): 4 [IU] via SUBCUTANEOUS
  Administered 2022-09-18: 7 [IU] via SUBCUTANEOUS
  Administered 2022-09-18: 3 [IU] via SUBCUTANEOUS
  Administered 2022-09-18: 4 [IU] via SUBCUTANEOUS
  Administered 2022-09-18: 7 [IU] via SUBCUTANEOUS
  Administered 2022-09-18 – 2022-09-19 (×2): 3 [IU] via SUBCUTANEOUS

## 2022-09-16 MED ORDER — CEFAZOLIN SODIUM-DEXTROSE 2-4 GM/100ML-% IV SOLN: 2.0000 g | Freq: Three times a day (TID) | INTRAVENOUS | Status: DC

## 2022-09-16 MED ORDER — HEPARIN (PORCINE) 25000 UT/250ML-% IV SOLN
850.0000 [IU]/h | INTRAVENOUS | Status: DC
  Administered 2022-09-16: 850 [IU]/h via INTRAVENOUS
  Administered 2022-09-17: 950 [IU]/h via INTRAVENOUS
  Administered 2022-09-18: 850 [IU]/h via INTRAVENOUS
  Filled 2022-09-16 (×3): qty 250

## 2022-09-16 NOTE — Progress Notes (Signed)
ANTICOAGULATION CONSULT NOTE  Pharmacy Consult:  Heparin Indication: atrial fibrillation  No Known Allergies  Patient Measurements: Height: '5\' 9"'$  (175.3 cm) Weight: 66.7 kg (147 lb 0.8 oz) IBW/kg (Calculated) : 70.7 Heparin Dosing Weight: 76 kg  Vital Signs: Temp: 99.4 F (37.4 C) (01/19 0400) Temp Source: Oral (01/19 0400) BP: 123/56 (01/19 0600) Pulse Rate: 50 (01/19 0600)  Labs: Recent Labs    09/13/22 2051 09/14/22 0112 09/15/22 0311  HGB  --  10.2* 9.2*  HCT  --  33.7* 30.3*  PLT  --  149* 198  CREATININE 1.09 1.03 1.09     Estimated Creatinine Clearance: 47.6 mL/min (by C-G formula based on SCr of 1.09 mg/dL).   Assessment: 84 YOM with history of Afib on Eliquis PTA, last down unknown, prior to 1/8. Patient admitted with MCA CVA s/p thrombectomy on 1/9. He was started on IV heparin, which was then held due to large size of stroke. Pharmacy consulted to restart IV heparin 1/13 while Eliquis held. Heparin then held on 1/15 (and aspirin '325mg'$  daily continued) per Neurology with petechial hemorrhage. Pharmacy consulted to resume heparin infusion on 1/19 with patient improving, dosing per stroke protocol.  Heparin level previously therapeutic on 850 units/hr. Hg low but stable at 9.2, plt up to 198 (last labs 1/18). SCr trend up slightly to 1.09. No new bleed issues reported.  Goal of Therapy:  Heparin level 0.3-0.5 units/ml Monitor platelets by anticoagulation protocol: Yes   Plan:  No bolus per stroke protocol. Resume heparin at previous therapeutic rate 850 units/hr Check 8hr heparin level Monitor daily CBC, s/sx bleeding   Arturo Morton, PharmD, BCPS Please check AMION for all [place] contact numbers Clinical Pharmacist 09/16/2022 9:45 AM

## 2022-09-16 NOTE — Progress Notes (Signed)
ANTICOAGULATION CONSULT NOTE  Pharmacy Consult:  Heparin Indication: atrial fibrillation  No Known Allergies  Patient Measurements: Height: '5\' 9"'$  (175.3 cm) Weight: 66.7 kg (147 lb 0.8 oz) IBW/kg (Calculated) : 70.7 Heparin Dosing Weight: 76 kg  Vital Signs: Temp: 98.4 F (36.9 C) (01/19 1600) Temp Source: Axillary (01/19 1600) BP: 109/61 (01/19 1900) Pulse Rate: 92 (01/19 1900)  Labs: Recent Labs    09/13/22 2051 09/14/22 0112 09/15/22 0311 09/16/22 1754  HGB  --  10.2* 9.2*  --   HCT  --  33.7* 30.3*  --   PLT  --  149* 198  --   HEPARINUNFRC  --   --   --  0.24*  CREATININE 1.09 1.03 1.09  --      Estimated Creatinine Clearance: 47.6 mL/min (by C-G formula based on SCr of 1.09 mg/dL).   Assessment: 40 YOM with history of Afib on Eliquis PTA, last down unknown, prior to 1/8. Patient admitted with MCA CVA s/p thrombectomy on 1/9. He was started on IV heparin, which was then held due to large size of stroke. Pharmacy consulted to restart IV heparin 1/13 while Eliquis held. Heparin then held on 1/15 (and aspirin '325mg'$  daily continued) per Neurology with petechial hemorrhage. Pharmacy consulted to resume heparin infusion on 1/19 with patient improving, dosing per stroke protocol.  Heparin level subtherapeutic (0.24) on infusion at 850 units/hr. No issues with line or bleeding reported per RN.  Goal of Therapy:  Heparin level 0.3-0.5 units/ml Monitor platelets by anticoagulation protocol: Yes   Plan:  Increase heparin to 950 units/hr F/u 8hr heparin level  Sherlon Handing, PharmD, BCPS Please see amion for complete clinical pharmacist phone list 09/16/2022 7:38 PM

## 2022-09-16 NOTE — Progress Notes (Signed)
NAME:  Vincent [name], MRN:  [id number], DOB:  09/29/1935, LOS: 69 ADMISSION DATE:  09/02/2022, CONSULTATION DATE:  09/06/2022 REFERRING MD:  Marjory Lies - NIR CHIEF COMPLAINT:  S/p basilar artery occlusion, vent Management   History of Present Illness:  87 year old man who presented to [facility] ED 1/8 as a Code Stroke after being found down at home. Presented with R gaze preference, L-sided deficits and dysarthria. PMHx significant for HTN, HLD, AF (on Eliquis), CHF (EF 25-30%), CAD s/p stent (2004), prior CVA with residual L-sided deficits, COPD, T2DM.  CTA Head/Neck demonstrated distal basilar artery occlusion, left V4 occlusion. He was seen by neurology and IR and was taken to IR for revascularization of distal basilar artery; TICI 2C revascularization achieved. Post-procedure CT negative for hemorrhage.  Post-procedure, he was left intubated and was transferred to ICU.  PCCM was asked to see for vent management.  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 s/p remote stent to Cx 2004 with chronic stable angina in context of residual circumflex disease.   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 1/11 agitated with vent wean 1/12 less interactive. CT head with no acute findings. Failed SBT with tachypnea. Given lasix with 2L out  1/13 blood culture positive for MSSA 1/15 Repeat CT head (09/12/2022 05:41): Stable post-cerebellar infarction petechial hemorrhage (Heidelberg classification 1c: PH1), most apparent in the right cerebellar hemisphere. No significant posterior fossa mass effect or other complicating features. Recent bilateral cerebellar and brainstem infarcts with chronic supratentorial ischemia and no new intracranial abnormality identified.  1/15 started on hypertonic saline 1/16 family meeting with sister  1/18 repeat head CT - Extensive recent infratentorial infarcts with petechial hemorrhage at the right lower cerebellum. The fourth ventricle remains patent. Large remote right MCA distribution infarct.  1/19 No acute issues overnight   Interim History / Subjective:  Interactive on vent   Objective:  Blood pressure (!) 123/56, pulse (!) 50, temperature 99.4 F (37.4 C), temperature source Oral, resp. rate 18, height '5\' 9"'$  (1.753 m), weight 66.7 kg, SpO2 94 %.    Vent Mode: PSV;CPAP FiO2 (%):  [30 %] 30 % Set Rate:  [15 bmp-26 bmp] 26 bmp Vt Set:  [560 mL] 560 mL PEEP:  [5 cmH20] 5 cmH20 Pressure Support:  [8 cmH20] 8 cmH20 Plateau Pressure:  [13 cmH20-23 cmH20] 16 cmH20   Intake/Output Summary (Last 24 hours) at 09/16/2022 0931 Last data filed at 09/16/2022 0500 Gross per 24 hour  Intake  1517.54 ml  Output 2700 ml  Net -1182.46 ml    Filed Weights   09/06/22 0200 09/06/22 0432 09/15/22 0500  Weight: 84.2 kg 75.7 kg 66.7 kg   Physical Exam  General: Acute on chronically ill appearing elderly male lying in bed on mechanical ventilation, in NAD HEENT: ETT, MM pink/moist, PERRL,  Neuro: Able to follow simple commands on  vent CV: s1s2 regular rate and rhythm, no murmur, rubs, or gallops,  PULM:  Clear to ascultation, no increased work of breathing, no added breath sounds  GI: soft, bowel sounds active in all 4 quadrants, non-tender, non-distended, tolerating TF Extremities: warm/dry, no edema  Skin: no rashes or lesions  Assessment & Plan:   Bilateral large cerebellar infarct with left pontine infarct in the setting of basilar artery occlusion now s/p IR intervention Acute encephalopathy -CT scan 1/15 shows petechial bleeding and cerebellar infarcts P: Primary management per neurology  Maintain neuro protective measures; goal for eurothermia, euglycemia, eunatermia, normoxia, and PCO2 goal of 35-40 Nutrition and bowel regiment  Aspirations precautions  Resume Heparin per neurology  Continue ASA  Remains on Precedex   Respiratory failure -Intubated during IR revascularization and has remained intubated due to waxing and weaning mentation  -Size of his stroke is extensive and may not be able to protect his airway History of COPD P: Continue ventilator support with lung protective strategies  Wean PEEP and FiO2 for sats greater than 90%. Head of bed elevated 30 degrees. Plateau pressures less than 30 cm H20.  Follow intermittent chest x-ray and ABG.   SAT/SBT as tolerated, mentation preclude extubation  Ensure adequate pulmonary hygiene  Follow cultures  VAP bundle in place  PAD protocol BDs  MSSA bacteremia -Cultures positive 1/13  P: ID following, appreciate assistance  Continue Cefazolin x4 weeks per ID   History of atrial fibrillation was on Eliquis P: Resume IV heparin 1/19 ECHO as below   Heart failure with reduced ejection fraction -ECHO 1/15 with EF 20-25% with severally decreased LV function and global hypokinesis  Hypertension Hyperlipidemia P: Continuous telemetry  Continue ASA and statin Strict intake and output  Daily weight to assess volume status Continue  Spironolactone and Entresto  Home Imdur and Coreg remain on hold   Diabetes P: Continue SSI increase to resistance scale  Increase TF coverage   Cephalic vein thrombosis P: Resume IV heaprin 1/19  Hypokalemia  P: Supplement as needed  Trend bmet   Ongoing goals of care discussion  1/18: Patient is more aware and interactive today. I did ask him about being on the ventilator long-term and he does not want to be on the ventilator long-term. He did indicate that if we get him off the ventilator, does not want to be back on the ventilator even if it means he is going to die-conversation was in the presence of his nurse and the respiratory therapist. This was confirmed by Dr. Leonie Man- when he evaluated the patient as well 1/19 Sister at bedside discussion plan of care with palliative care. Introduced myself and gave a brief update. Sister is still struggling with making goals of care decision. She was leaning toward less aggressive plan of care yesterday but today is questioning that decision. Will continue to support family as goals of care are being discussed    Best practice (evaluated daily):  Diet/type: TF DVT prophylaxis: SCD GI prophylaxis: PPI Lines: N/A Foley:  External foley in place  Code Status:  full code Last date of multidisciplinary goals  of care discussion: Per primary  Critical care:  CRITICAL CARE Performed by: Audra Bellard D. Harris  Total critical care time: 38 minutes  Critical care time was exclusive of separately billable procedures and treating other patients.  Critical care was necessary to treat or prevent imminent or life-threatening deterioration.  Critical care was time spent personally by me on the following activities: development of treatment plan with patient and/or surrogate as well as nursing, discussions with consultants, evaluation of patient's response to treatment, examination of patient, obtaining history from patient or surrogate, ordering and  performing treatments and interventions, ordering and review of laboratory studies, ordering and review of radiographic studies, pulse oximetry and re-evaluation of patient's condition.  Amora Sheehy D. Harris, NP-C [facility] Personal contact information can be found on Amion  If no contact or response made please call [phone] 09/16/2022, 9:55 AM

## 2022-09-16 NOTE — Progress Notes (Signed)
PHARMACY CONSULT NOTE FOR:  OUTPATIENT  PARENTERAL ANTIBIOTIC THERAPY (OPAT)  Indication: septic thrombophlebitis 2/2 MSSA  Regimen:  - cefazolin 2g IV Q6H through 09/26/22, followed by:  - cefazolin 2g IV Q8H through 10/12/22    IV antibiotic discharge orders are pended. To discharging provider:  please sign these orders via discharge navigator,  Select New Orders & click on the button choice - Manage This Unsigned Work.     Thank you for allowing pharmacy to be a part of this patient's care.  Adria Dill, PharmD PGY-2 Infectious Diseases Resident  09/16/2022 4:33 PM

## 2022-09-16 NOTE — Plan of Care (Signed)
Patient received on [facility] settings as charted. Placed on PSV 8/5 this morning and is tolerating this well. Remains on PSV at this time.

## 2022-09-16 NOTE — Progress Notes (Incomplete)
Nutrition Follow-up  DOCUMENTATION CODES:   Non-severe (moderate) malnutrition in context of chronic illness  INTERVENTION:   Tube feeding via Cortrak tube: Osmolite 1.5 @ 50 ml/hr (1200 ml per day) Prosource TF20 60 ml BID  Provides 1960 kcal, 115 gm protein, 912 ml free water daily   NUTRITION DIAGNOSIS:   Moderate Malnutrition related to chronic illness (COPD/CHF) as evidenced by moderate fat depletion, moderate muscle depletion. Ongoing.   GOAL:   Patient will meet greater than or equal to 90% of their needs Met with TF at goal   MONITOR:   TF tolerance  REASON FOR ASSESSMENT:   Consult Enteral/tube feeding initiation and management  ASSESSMENT:   Pt with PMH of HTN, HLD, AF on Eliquis, CHF with EF 25-30%, CAD s/p stent, CVA with L-sided deficits, COPD, DM admitted with bilateral large cerebellar infarct s/p IR revascularization.   CCM working with pt and daughter on [place]. Insulin coverage increased.   1/10 - s/p cortrak placement; per xray tip mid-body of stomach   Medications reviewed and include: colace, lasix, SSI, 7 units novolog every 4 hours, protonix, miralax, spironolactone Precedex  Labs reviewed: Na 157, (K 3.3, PO4 2.4 - 1/18) CBG's: 158-246   Diet Order:   Diet Order             Diet NPO time specified  Diet effective now                   EDUCATION NEEDS:   Not appropriate for education at this time  Skin:  Skin Assessment: Reviewed RN Assessment (R venous stasis ulcer)  Last BM:  650 ml FMS  Height:   Ht Readings from Last 1 Encounters:  09/06/22 '5\' 9"'$  (1.753 m)    Weight:   Wt Readings from Last 1 Encounters:  09/15/22 66.7 kg    BMI:  Body mass index is 21.72 kg/m.  Estimated Nutritional Needs:   Kcal:  1800-2000  Protein:  110-120 grams  Fluid:  >1.8 L/day  Lockie Pares., RD, LDN, CNSC See AMiON for contact information

## 2022-09-16 NOTE — Progress Notes (Addendum)
STROKE TEAM PROGRESS NOTE   INTERVAL HISTORY Patient is seen in his room with no family at the bedside.  He can intermittently follow commands and will answer questions by nodding or shaking his head.  Today, he nods his head yes when asked if he would want reintubation, trach and PEG.  Patient's sister continues to be unable to make a decision and has met with palliative care and wants full support at the present time.  Need to schedule trach and PEG next week  Vitals:   09/16/22 1200 09/16/22 1300 09/16/22 1400 09/16/22 1454  BP: (!) 96/49 (!) 108/51 (!) 111/44   Pulse: 72 78 94   Resp: (!) 25 (!) 31 (!) 28   Temp:      TempSrc:      SpO2: 94% 95% 93% 99%  Weight:      Height:       CBC:  Recent Labs  Lab 09/14/22 0112 09/15/22 0311  WBC 8.9 9.8  NEUTROABS 7.4 8.7*  HGB 10.2* 9.2*  HCT 33.7* 30.3*  MCV 88.7 89.4  PLT 149* 478    Basic Metabolic Panel:  Recent Labs  Lab 09/14/22 0112 09/14/22 0917 09/15/22 0311 09/15/22 0915 09/15/22 2134 09/16/22 0343  NA 153*   < > 156*   < > 158* 157*  K 3.6  --  3.3*  --   --   --   CL 117*  --  121*  --   --   --   CO2 26  --  32  --   --   --   GLUCOSE 285*  --  238*  --   --   --   BUN 29*  --  28*  --   --   --   CREATININE 1.03  --  1.09  --   --   --   CALCIUM 7.7*  --  8.0*  --   --   --   MG 2.1  --  2.2  --   --   --   PHOS 1.9*  --  2.4*  --   --   --    < > = values in this interval not displayed.    Lipid Panel:  No results for input(s): "CHOL", "TRIG", "HDL", "CHOLHDL", "VLDL", "LDLCALC" in the last 168 hours.  HgbA1c:  No results for input(s): "HGBA1C" in the last 168 hours.  Urine Drug Screen: No results for input(s): "LABOPIA", "COCAINSCRNUR", "LABBENZ", "AMPHETMU", "THCU", "LABBARB" in the last 168 hours.  Alcohol Level No results for input(s): "ETH" in the last 168 hours.  IMAGING past 24 hours No results found.  PHYSICAL EXAM  General - Well nourished, well developed elderly African-American  male intubated on low-dose Precedex for sedation   Ophthalmologic - fundi not visualized due to noncooperation.   Cardiovascular - irregularly irregular heart rate and rhythm.   Neuro - intubated on low-dose Precedex, drowsy but eyes open on voice, following simple commands. Eyes in more left gaze preference position, slowly tracking to right but not complete right gaze, pupils equal round and reactive to light. Corneal reflex present, gag and cough present.  Initiating breaths on CPAP mode on the vent.  Facial symmetry not able to test due to ET tube.  Tongue protrusion not cooperative. Follows simple commands and against gravity BUEs left greater than right, wiggle toes on b/l feet.  Sensation, coordination not cooperative and gait not tested.     ASSESSMENT/PLAN [name]  [name] is a 87 y.o. male with history of  alcoholic cardiomyopathy, permanent atrial fibrillation on Eliquis, CHF with an EF 25 to 30% with global hypokinesis, CAD s/p stenting, DM2, prior stroke, chronic bilateral lower extremity wounds with edema, LLE peroneal neuropathy with chronic foot drop, COPD, HLD and HTN who presented to the ED via EMS  after he was found down.  Patient remains intubated, and goals of care conversations are ongoing  Stroke:  Bilateral large cerebellar infarct and left pontine infarct as well as right ACA punctate infarct with BA occlusion s/p IR with TICI 2c in the setting of chronic A-fib on eliquis  Code Stroke CT head No acute abnormality.  CTA head & neck Occlusion of the left V4 segment and poor opacification of the basilar artery with apparent occlusion distally, age indeterminate. The bilateral PCAs are supplied by posterior communicating arteries with likely retrograde flow into the P1 segments.Multifocal high-grade stenosis of the right M2 and M3 branches   S/p IR with TICI2c reperfusion MRI - Multiple areas of acute infarct involving the cerebellum bilaterally, left pons, and right frontal  white matter. 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 Large area of chronic infarct and mild chronic hemorrhage and in the right MCA territory.  MRA  - Basilar artery is patent now.   1/11- CT repeat - no hydrocephalus  1/12- CT repeat - no hydrocephalus CT Head 1/18-  Extensive recent infratentorial infarcts with petechial hemorrhage at the right lower cerebellum. The fourth ventricle remains patent. Large remote right MCA distribution infarct. 2D Echo EF 20-25%  LDL 56 HgbA1c 6.5 VTE prophylaxis - Heparin subq Eliquis (apixaban) daily prior to admission,   resumed AC with heparin IV 09/10/2022 and held again on 09/11/2022 after petechial hemorrhagic transformation, resumed on 1/19 Therapy recommendations: Pending Disposition:  Pending  Chronic Atrial Fibrillation  Home med: Eliquis Rate controlled Heparin IV  Brain Compression Hypertonic saline discontinued 1/18 Na 157  Congestive heart failure Alcoholic cardiomyopathy 04/5637 EF 25 to 30%.  This admission EF 20 to 25%. Home meds including Imdur, digoxin, Jardiance, lasix On digoxin CCM managing Pt follows with Dr. Aundra Dubin as outpt  Hypertension Home meds:  Lasix, coreg, aldactone Stable BP goal < 180/105 Long-term BP goal normotensive  Post operative respiratory insufficiency  Remains intubated post procedure CCM to manage ventilator settings Hx of COPD Extubate as able  Hyperlipidemia Home meds:  Atorvastatin '80mg'$   LDL 56, goal < 70 AST/ALT 64/48-> 33/30 Resumed lipitor 80 Continue statin at discharge  Diabetes type II Controlled Home meds:  Jardiance, metformin HgbA1c 6.5, goal < 7.0 CBGs SSI  Other Stroke Risk Factors Advanced Age >/= 38  Hx stroke/TIA Evidence of chronic infarcts on imaging Coronary artery disease status post stenting patient  Other Active Problems Chronic BLE wound Chronic b/l foot drop Recent left hip fracture - walking with walker at  home Leukopenia WBC 3.8->7.3->5.6 -> 3.8->5.4 Hypokalemia K 3.2 -> 3.7->3.6  Left upper extremity thrombophlebitis Febrile-Tmax 103F ID consulted Blood cultures for MSSA  On cefazolin   Hospital Day #11   Patient seen and examined by NP/APP with MD. MD to update note as needed.   [place] , MSN, AGACNP-BC [facility] See Amion for schedule and pager information 09/16/2022 4:01 PM   I have personally obtained history,examined this patient, reviewed notes, independently viewed imaging studies, participated in medical decision making and plan of care.ROS completed by me personally and pertinent positives fully documented  I have made any additions or clarifications directly to the above note. Agree with note above.  Patient neurological status appears to be improving and he was clearly able to understand and follow my commands and states he is agreeable to tracheostomy and PEG tube and his sister also seems to be of the same opinion hence we discussed with critical care and schedule both for next week.  Continue ongoing ventilatory support.  No family available at the bedside today for discussion.  Discussed with Dr. Darien Ramus critical care medicine. This patient is critically ill and at significant risk of neurological worsening, death and care requires constant monitoring of vital signs, hemodynamics,respiratory and cardiac monitoring, extensive review of multiple databases, frequent neurological assessment, discussion with family, other specialists and medical decision making of high complexity.I have made any additions or clarifications directly to the above note.This critical care time does not reflect procedure time, or teaching time or supervisory time of PA/NP/Med Resident etc but could involve care discussion time.  I spent 30 minutes of neurocritical care time  in the care of  this patient.     Antony Contras, MD Medical Director [facility] Pager:  [phone] 09/16/2022 4:46 PM   After hours, contact General Neurology

## 2022-09-16 NOTE — Consult Note (Signed)
Palliative Medicine Inpatient Consult Note  Consulting Provider: Dr. Ander Slade  Reason for consult:   [place] [facility] Palliative Medicine Consult  Reason for Consult? Goals of care discussion   Assist with ongoing goals of care discussions Patient with large stroke, awake, interactive.  May be extubatable but we do not have a clear direction if he does not do well protecting his airway post extubation.   09/16/2022  HPI:  Per intake H&P --> 87 year old man PMHx significant for HTN, HLD, AF (on Eliquis), CHF (EF 25-30%), CAD s/p stent (2004), prior CVA with residual L-sided deficits, COPD, T2DM. Presented to [facility] ED 1/8 as a Code Stroke after being found down at home. Presented with R gaze preference, L-sided deficits and dysarthria. CTA Head/Neck demonstrated distal basilar artery occlusion, left V4 occlusion. He was seen by neurology and IR and was taken to IR for revascularization of distal basilar artery; TICI 2C revascularization achieved.   Palliative care asked to get involved to further help navigate goals in terms of possible extubation.   Clinical Assessment/Goals of Care:  *Please note that this is a verbal dictation therefore any spelling or grammatical errors are due to the "[place] One" system interpretation.  I have reviewed medical records including EPIC notes, labs and imaging, received report from bedside RN, assessed the patient who is awake and able to squeeze my hand on the right side appropriately.    I met with Kregg and his sister, [name] to further discuss diagnosis prognosis, Nesbitt, EOL wishes, disposition and options.   I introduced Palliative Medicine as specialized medical care for people living with serious illness. It focuses on providing relief from the symptoms and stress of a serious illness. The goal is to improve quality of life for both the patient and the family.  Medical History Review and Understanding:  Discussed Emet's h/o  advanced heart failure, HTN, HLD, Afib,COPD, T2DM, and CAD.   Social History:  Jaymes is from [place], [place] originally. He was married though is divorced. He has no children. He had three sisters, two of whom are deceased. 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 He was very active in Civil Right movements. He is a man of faith and practices within Christianity  Functional and Nutritional State:  Prior to hospitalization, Yusuf was fully independent though required a walker for mobility after a recent hip replacement. Due to this he was living with his sister [name].   Advance Directives:  A detailed discussion was had today regarding advanced directives.  Patients only living next of kin is his siter, [name] who is his primary decision maker   Code Status:  Concepts specific to code status, artifical feeding and hydration, continued IV antibiotics and rehospitalization was had.  The difference between a aggressive medical intervention path  and a palliative comfort care path for this patient at this time was had.   Encouraged patient/family to consider DNR/DNI status understanding evidenced based poor outcomes in similar hospitalized patient, as the cause of arrest is likely associated with advanced chronic/terminal illness rather than an easily reversible acute cardio-pulmonary event. I explained that DNR/DNI does not change the medical plan and it only comes into effect after a person has arrested (died).  It is a protective measure to keep [place] from harming the patient in their last moments of life.  Patient sister is very divided about this decision.  She expresses  that she felt very certain of what Aijalon would want yesterday though today she feels differently.  Discussion:  Patient's Sister [name] is visibly anxious during our conversation.  She expresses that she feels very overwhelmed.   She shares with me that she is lost 10 pounds in the last week worrying about Clanton's health and wellness.  She expresses that she spoke to his best friend overnight-Charls Sherral Hammers who believes if Tab were in a long-term state of being unwell and "vegetative" that this is not the quality of life which would be considered acceptable to him.  We reviewed the extent of Davids stroke and the possible long-term impacts of this.  We discussed the decisions at hand to our if Billy is able to successfully be extubated whether or not he would desire reintubation. [name] feels uncertain in terms of how aggressive or not aggressive to be.  She shares that yesterday she was very clear that Byren would never want to live long-term in a vegetative state though he is far more coherent than she had anticipated and telling her that he does not want to die.  Marion and I were able to speak to Amen though his mental state appears to be fluctuant.  At this point in time it is unclear to know what he does or does not want as he is able to squeeze my hand though when I asked him a certain number of times it is inconsistent as a similar with him blinking his eyes.  [name] shares that she needs time to think about the decisions that need to be made in the direction of care that she would like to pursue moving forward.  At this point in time she is elected to keep everything the same but she does recognize the need for additional conversation in the oncoming days.  Discussed the importance of continued conversation with family and their  medical providers regarding overall plan of care and treatment options, ensuring decisions are within the context of the patients values and GOCs.  Decision Maker: Eads,Marion (Sister): [phone] (Mobile)   SUMMARY OF RECOMMENDATIONS   Full code --> for the time being patient's sister is considering the long-term of possible reintubation  Continue present plan of care  Ongoing PMT  support --> working on establishing a relationship with patient's sister, [name]   Code Status/Advance Care Planning: FULL CODE   Palliative Prophylaxis:  Aspiration, Bowel Regimen, Delirium Protocol, Frequent Pain Assessment, Oral Care, Palliative Wound Care, and Turn Reposition  Additional Recommendations (Limitations, Scope, Preferences): Continue full scope of care for the time being  Psycho-social/Spiritual:  Desire for further Chaplaincy support: Not presently Additional Recommendations: Discussion of CVAs   Prognosis: Worrisome overall patient had been declining over the last 6 months and has had 3 readmissions.  Discharge Planning: Unclear at this time will depend upon patient's progress.  Vitals:   09/16/22 0500 09/16/22 0600  BP: (!) 110/52 (!) 123/56  Pulse: (!) 53 (!) 50  Resp: 19 18  Temp:    SpO2: 94% 96%    Intake/Output Summary (Last 24 hours) at 09/16/2022 0721 Last data filed at 09/16/2022 0500 Gross per 24 hour  Intake 1735.59 ml  Output 2700 ml  Net -964.41 ml   Last Weight  Most recent update: 09/15/2022  5:04 AM    Weight  66.7 kg (147 lb 0.8 oz)            Gen: Elderly chronically ill-appearing African-American male HEENT: ETT, core track, dry mucous  membranes CV: Regular rate and rhythm PULM: Intubated ABD: soft/nontender/nondistended EXT: No edema Neuro: Alert and able to squeeze hand as well as blink eyes though inconsistent  PPS: 10%   This conversation/these recommendations were discussed with CCM - Gerald Leitz  Total Time: 23 Billing based on MDM: High ______________________________________________________ [facility] [facility] Team Team Cell Phone: [phone] Please utilize secure chat with additional questions, if there is no response within 30 minutes please call the above phone number  Palliative Medicine Team providers are available by phone from 7am to 7pm daily and can be reached through  the team cell phone.  Should this patient require assistance outside of these hours, please call the patient's attending physician.

## 2022-09-16 NOTE — Progress Notes (Signed)
[facility] for Infectious Disease  Date of Admission:  09/25/2022     Total days of antibiotics 7         ASSESSMENT:  Vincent [name] is more awake and interactive today. Blood cultures from 1/14 and 1/16 remain without growth to date. Plan will remain for 4 weeks of Cefazolin with end date 10/09/22 . Goals of care and extubation planning ongoing.  OPAT orders placed. Follow up in ID office after discharge. Remaining medical and supportive care per PCCM.   PLAN:  Continue Cefazolin through 10/09/22 Ongoing goals of care and extubation planning.  OPAT/Home Health orders placed.  Follow up arranged in ID clinic.  Remaining medical and supportive care per PCCM.   ID will sign off. Please re-consult if needed.   Diagnosis:  MSSA bacteremia   Culture Result: MSSA  No Known Allergies  OPAT Orders Discharge antibiotics to be given via PICC line Discharge antibiotics: Cefazolin  Per pharmacy protocol   Duration: 4 weeks End Date: 10/09/22  [facility] Care Per Protocol:  Home health RN for IV administration and teaching; PICC line care and labs.    Labs weekly while on IV antibiotics: _X_ CBC with differential _X_ BMP __ CMP _X_ CRP _X_ ESR __ Vancomycin trough __ CK  _X_ Please pull PIC at completion of IV antibiotics __ Please leave PIC in place until doctor has seen patient or been notified  Fax weekly labs to [phone]  Clinic Follow Up Appt:  10/06/22 at 2:45 pm with Dr. Gale Journey   Principal Problem:   Status post surgery Active Problems:   Staphylococcus aureus bacteremia   Stroke (cerebrum) ([place])   Basilar artery occlusion   Chronic respiratory failure with hypoxia (HCC)   Basilar artery obstruction   Malnutrition of moderate degree   Encephalopathy acute   Ventilator dependence ([place])    aspirin  325 mg Per Tube Daily   atorvastatin  80 mg Per Tube Daily   brimonidine  1 drop Both Eyes BID   carvedilol  3.125 mg Per Tube BID   Chlorhexidine  Gluconate Cloth  6 each Topical Q0600   docusate  100 mg Per Tube BID   feeding supplement (PROSource TF20)  60 mL Per Tube BID   furosemide  40 mg Intravenous Daily   insulin aspart  0-20 Units Subcutaneous Q4H   insulin aspart  8 Units Subcutaneous Q4H   mouth rinse  15 mL Mouth Rinse Q2H   pantoprazole (PROTONIX) IV  40 mg Intravenous Q24H   polyethylene glycol  17 g Per Tube Daily   sacubitril-valsartan  1 tablet Per Tube BID   spironolactone  25 mg Per Tube Daily    SUBJECTIVE:  Afebrile overnight with no acute events. Interacting on ventilator.   No Known Allergies   Review of Systems: Review of Systems  Unable to perform ROS: Intubated      OBJECTIVE: Vitals:   09/16/22 1100 09/16/22 1117 09/16/22 1200 09/16/22 1300  BP: (!) 102/51  (!) 96/49 (!) 108/51  Pulse: 71  72 78  Resp: (!) 23 (!) 26 (!) 25 (!) 31  Temp:      TempSrc:      SpO2: 96%  94% 95%  Weight:      Height:       Body mass index is 21.72 kg/m.  Physical Exam Constitutional:      General: He is not in acute distress.    Appearance: He is well-developed.  Interventions: He is intubated and restrained.     Comments: Lying in bed with head of bed elevated; interacts with yes/no  Cardiovascular:     Rate and Rhythm: Normal rate and regular rhythm.     Heart sounds: Normal heart sounds.  Pulmonary:     Effort: Pulmonary effort is normal. He is intubated.     Breath sounds: Normal breath sounds.  Skin:    General: Skin is warm and dry.  Neurological:     Mental Status: He is alert.     Lab Results Lab Results  Component Value Date   WBC 9.8 09/15/2022   HGB 9.2 (L) 09/15/2022   HCT 30.3 (L) 09/15/2022   MCV 89.4 09/15/2022   PLT 198 09/15/2022    Lab Results  Component Value Date   CREATININE 1.09 09/15/2022   BUN 28 (H) 09/15/2022   NA 157 (H) 09/16/2022   K 3.3 (L) 09/15/2022   CL 121 (H) 09/15/2022   CO2 32 09/15/2022    Lab Results  Component Value Date   ALT 35  09/15/2022   AST 147 (H) 09/15/2022   ALKPHOS 107 09/15/2022   BILITOT 0.4 09/15/2022     Microbiology: Recent Results (from the past 240 hour(s))  Culture, Respiratory w Gram Stain     Status: None   Collection Time: 09/08/22 11:02 AM   Specimen: Tracheal Aspirate; Respiratory  Result Value Ref Range Status   Specimen Description TRACHEAL ASPIRATE  Final   Special Requests NONE  Final   Gram Stain   Final    NO WBC SEEN FEW GRAM POSITIVE COCCI IN CHAINS FEW GRAM NEGATIVE RODS RARE GRAM POSITIVE RODS    Culture   Final    ABUNDANT Normal respiratory flora-no Staph aureus or Pseudomonas seen Performed at [facility], [address] [place], [place] [address]    Report Status 09/10/2022 FINAL  Final  Culture, Respiratory w Gram Stain     Status: None   Collection Time: 09/10/22  5:48 PM   Specimen: Tracheal Aspirate; Respiratory  Result Value Ref Range Status   Specimen Description TRACHEAL ASPIRATE  Final   Special Requests NONE  Final   Gram Stain   Final    ABUNDANT WBC PRESENT, PREDOMINANTLY PMN FEW GRAM POSITIVE RODS RARE GRAM POSITIVE COCCI IN SINGLES IN PAIRS FEW GRAM NEGATIVE RODS Performed at [facility], [place] [address], [place], [place] [address]    Culture   Final    FEW ENTEROBACTER GERGOVIAE ABUNDANT STREPTOCOCCUS CONSTELLATUS    Report Status 09/14/2022 FINAL  Final   Organism ID, Bacteria ENTEROBACTER GERGOVIAE  Final   Organism ID, Bacteria STREPTOCOCCUS CONSTELLATUS  Final      Susceptibility   Streptococcus constellatus - MIC*    PENICILLIN <=0.06 SENSITIVE Sensitive     CEFTRIAXONE 0.25 SENSITIVE Sensitive     ERYTHROMYCIN <=0.12 SENSITIVE Sensitive     LEVOFLOXACIN 0.5 SENSITIVE Sensitive     VANCOMYCIN 0.25 SENSITIVE Sensitive     * ABUNDANT STREPTOCOCCUS CONSTELLATUS   Enterobacter gergoviae - MIC*    CEFAZOLIN <=4 SENSITIVE Sensitive     CEFEPIME <=0.12 SENSITIVE Sensitive     CEFTAZIDIME <=1 SENSITIVE Sensitive      CEFTRIAXONE <=0.25 SENSITIVE Sensitive     CIPROFLOXACIN <=0.25 SENSITIVE Sensitive     GENTAMICIN <=1 SENSITIVE Sensitive     IMIPENEM <=0.25 SENSITIVE Sensitive     TRIMETH/SULFA <=20 SENSITIVE Sensitive     PIP/TAZO <=4 SENSITIVE Sensitive     *  FEW ENTEROBACTER GERGOVIAE  Culture, blood (Routine X 2) w Reflex to ID Panel     Status: Abnormal   Collection Time: 09/10/22  6:06 PM   Specimen: BLOOD  Result Value Ref Range Status   Specimen Description BLOOD SITE NOT SPECIFIED  Final   Special Requests   Final    BOTTLES DRAWN AEROBIC AND ANAEROBIC Blood Culture adequate volume   Culture  Setup Time   Final    GRAM POSITIVE COCCI IN CLUSTERS IN BOTH AEROBIC AND ANAEROBIC BOTTLES CRITICAL RESULT CALLED TO, READ BACK BY AND VERIFIED WITH: PHARMD L.BELL AT 1206 ON 09/11/2022 BY T.SAAD. Performed at [facility], [place] [address] [place], [place] [address]    Culture STAPHYLOCOCCUS AUREUS (A)  Final   Report Status 09/13/2022 FINAL  Final   Organism ID, Bacteria STAPHYLOCOCCUS AUREUS  Final      Susceptibility   Staphylococcus aureus - MIC*    CIPROFLOXACIN <=0.5 SENSITIVE Sensitive     ERYTHROMYCIN <=0.25 SENSITIVE Sensitive     GENTAMICIN <=0.5 SENSITIVE Sensitive     OXACILLIN <=0.25 SENSITIVE Sensitive     TETRACYCLINE <=1 SENSITIVE Sensitive     VANCOMYCIN 1 SENSITIVE Sensitive     TRIMETH/SULFA <=10 SENSITIVE Sensitive     CLINDAMYCIN <=0.25 SENSITIVE Sensitive     RIFAMPIN <=0.5 SENSITIVE Sensitive     Inducible Clindamycin NEGATIVE Sensitive     * STAPHYLOCOCCUS AUREUS  Culture, blood (Routine X 2) w Reflex to ID Panel     Status: Abnormal   Collection Time: 09/10/22  6:06 PM   Specimen: BLOOD  Result Value Ref Range Status   Specimen Description BLOOD SITE NOT SPECIFIED  Final   Special Requests   Final    BOTTLES DRAWN AEROBIC AND ANAEROBIC Blood Culture adequate volume   Culture  Setup Time   Final    GRAM POSITIVE COCCI IN CLUSTERS IN BOTH AEROBIC AND  ANAEROBIC BOTTLES CRITICAL VALUE NOTED.  VALUE IS CONSISTENT WITH PREVIOUSLY REPORTED AND CALLED VALUE.    Culture (A)  Final    STAPHYLOCOCCUS AUREUS SUSCEPTIBILITIES PERFORMED ON PREVIOUS CULTURE WITHIN THE LAST 5 DAYS. Performed at [facility], [place] [address], [place], [place] [address]    Report Status 09/13/2022 FINAL  Final  Blood Culture ID Panel (Reflexed)     Status: Abnormal   Collection Time: 09/10/22  6:06 PM  Result Value Ref Range Status   Enterococcus faecalis NOT DETECTED NOT DETECTED Final   Enterococcus Faecium NOT DETECTED NOT DETECTED Final   Listeria monocytogenes NOT DETECTED NOT DETECTED Final   Staphylococcus species DETECTED (A) NOT DETECTED Final    Comment: CRITICAL RESULT CALLED TO, READ BACK BY AND VERIFIED WITH: PHARMD L.BELL AT 1206 ON 09/11/2022 BY T.SAAD.    Staphylococcus aureus (BCID) DETECTED (A) NOT DETECTED Final    Comment: CRITICAL RESULT CALLED TO, READ BACK BY AND VERIFIED WITH: PHARMD L.BELL AT 1206 ON 09/11/2022 BY T.SAAD.    Staphylococcus epidermidis NOT DETECTED NOT DETECTED Final   Staphylococcus lugdunensis NOT DETECTED NOT DETECTED Final   Streptococcus species NOT DETECTED NOT DETECTED Final   Streptococcus agalactiae NOT DETECTED NOT DETECTED Final   Streptococcus pneumoniae NOT DETECTED NOT DETECTED Final   Streptococcus pyogenes NOT DETECTED NOT DETECTED Final   A.calcoaceticus-baumannii NOT DETECTED NOT DETECTED Final   Bacteroides fragilis NOT DETECTED NOT DETECTED Final   Enterobacterales NOT DETECTED NOT DETECTED Final   Enterobacter cloacae complex NOT DETECTED NOT DETECTED Final   Escherichia  coli NOT DETECTED NOT DETECTED Final   Klebsiella aerogenes NOT DETECTED NOT DETECTED Final   Klebsiella oxytoca NOT DETECTED NOT DETECTED Final   Klebsiella pneumoniae NOT DETECTED NOT DETECTED Final   Proteus species NOT DETECTED NOT DETECTED Final   Salmonella species NOT DETECTED NOT DETECTED Final   Serratia marcescens  NOT DETECTED NOT DETECTED Final   Haemophilus influenzae NOT DETECTED NOT DETECTED Final   Neisseria meningitidis NOT DETECTED NOT DETECTED Final   Pseudomonas aeruginosa NOT DETECTED NOT DETECTED Final   Stenotrophomonas maltophilia NOT DETECTED NOT DETECTED Final   Candida albicans NOT DETECTED NOT DETECTED Final   Candida auris NOT DETECTED NOT DETECTED Final   Candida glabrata NOT DETECTED NOT DETECTED Final   Candida krusei NOT DETECTED NOT DETECTED Final   Candida parapsilosis NOT DETECTED NOT DETECTED Final   Candida tropicalis NOT DETECTED NOT DETECTED Final   Cryptococcus neoformans/gattii NOT DETECTED NOT DETECTED Final   Meth resistant mecA/C and MREJ NOT DETECTED NOT DETECTED Final    Comment: Performed at [facility], [place] [address], [place], [place] [address]  Respiratory (~20 pathogens) panel by PCR     Status: None   Collection Time: 09/11/22  1:06 PM   Specimen: Nasopharyngeal Swab; Respiratory  Result Value Ref Range Status   Adenovirus NOT DETECTED NOT DETECTED Final   Coronavirus 229E NOT DETECTED NOT DETECTED Final    Comment: (NOTE) The Coronavirus on the Respiratory Panel, DOES NOT test for the novel  Coronavirus (2019 nCoV)    Coronavirus HKU1 NOT DETECTED NOT DETECTED Final   Coronavirus NL63 NOT DETECTED NOT DETECTED Final   Coronavirus OC43 NOT DETECTED NOT DETECTED Final   Metapneumovirus NOT DETECTED NOT DETECTED Final   Rhinovirus / Enterovirus NOT DETECTED NOT DETECTED Final   Influenza A NOT DETECTED NOT DETECTED Final   Influenza B NOT DETECTED NOT DETECTED Final   Parainfluenza Virus 1 NOT DETECTED NOT DETECTED Final   Parainfluenza Virus 2 NOT DETECTED NOT DETECTED Final   Parainfluenza Virus 3 NOT DETECTED NOT DETECTED Final   Parainfluenza Virus 4 NOT DETECTED NOT DETECTED Final   Respiratory Syncytial Virus NOT DETECTED NOT DETECTED Final   Bordetella pertussis NOT DETECTED NOT DETECTED Final   Bordetella Parapertussis NOT DETECTED NOT  DETECTED Final   Chlamydophila pneumoniae NOT DETECTED NOT DETECTED Final   Mycoplasma pneumoniae NOT DETECTED NOT DETECTED Final    Comment: Performed at [facility], [place], [place], [place] [address]  Culture, blood (Routine X 2) w Reflex to ID Panel     Status: None   Collection Time: 09/11/22  5:46 PM   Specimen: BLOOD  Result Value Ref Range Status   Specimen Description BLOOD SITE NOT SPECIFIED  Final   Special Requests   Final    BOTTLES DRAWN AEROBIC ONLY Blood Culture results may not be optimal due to an inadequate volume of blood received in culture bottles   Culture   Final    NO GROWTH 5 DAYS Performed at [facility], [place] [address], [place], [place] [address]    Report Status 09/16/2022 FINAL  Final  Culture, blood (Routine X 2) w Reflex to ID Panel     Status: None (Preliminary result)   Collection Time: 09/13/22 11:09 AM   Specimen: BLOOD RIGHT ARM  Result Value Ref Range Status   Specimen Description BLOOD RIGHT ARM  Final   Special Requests   Final    BOTTLES DRAWN AEROBIC  AND ANAEROBIC Blood Culture adequate volume   Culture   Final    NO GROWTH 3 DAYS Performed at [facility], [place] [address], [place], [place] [address]    Report Status PENDING  Incomplete  Culture, blood (Routine X 2) w Reflex to ID Panel     Status: None (Preliminary result)   Collection Time: 09/13/22 11:11 AM   Specimen: BLOOD RIGHT ARM  Result Value Ref Range Status   Specimen Description BLOOD RIGHT ARM  Final   Special Requests   Final    BOTTLES DRAWN AEROBIC AND ANAEROBIC Blood Culture adequate volume   Culture   Final    NO GROWTH 3 DAYS Performed at [facility], [place] [address], [place], [place] [address]    Report Status PENDING  Incomplete     Terri Piedra, [facility]  09/16/2022  1:24 PM

## 2022-09-17 DIAGNOSIS — I482 Chronic atrial fibrillation, unspecified: Secondary | ICD-10-CM | POA: Diagnosis not present

## 2022-09-17 DIAGNOSIS — I639 Cerebral infarction, unspecified: Secondary | ICD-10-CM | POA: Diagnosis not present

## 2022-09-17 DIAGNOSIS — J9601 Acute respiratory failure with hypoxia: Secondary | ICD-10-CM

## 2022-09-17 DIAGNOSIS — Z515 Encounter for palliative care: Secondary | ICD-10-CM | POA: Diagnosis not present

## 2022-09-17 DIAGNOSIS — J9602 Acute respiratory failure with hypercapnia: Secondary | ICD-10-CM

## 2022-09-17 DIAGNOSIS — J690 Pneumonitis due to inhalation of food and vomit: Secondary | ICD-10-CM | POA: Diagnosis not present

## 2022-09-17 DIAGNOSIS — I651 Occlusion and stenosis of basilar artery: Secondary | ICD-10-CM | POA: Diagnosis not present

## 2022-09-17 LAB — CBC
HCT: 31.8 % — ABNORMAL LOW (ref 39.0–52.0)
Hemoglobin: 9.6 g/dL — ABNORMAL LOW (ref 13.0–17.0)
MCH: 26.7 pg (ref 26.0–34.0)
MCHC: 30.2 g/dL (ref 30.0–36.0)
MCV: 88.6 fL (ref 80.0–100.0)
Platelets: 236 10*3/uL (ref 150–400)
RBC: 3.59 MIL/uL — ABNORMAL LOW (ref 4.22–5.81)
RDW: 17.9 % — ABNORMAL HIGH (ref 11.5–15.5)
WBC: 9.6 10*3/uL (ref 4.0–10.5)
nRBC: 0 % (ref 0.0–0.2)

## 2022-09-17 LAB — GLUCOSE, CAPILLARY
Glucose-Capillary: 117 mg/dL — ABNORMAL HIGH (ref 70–99)
Glucose-Capillary: 157 mg/dL — ABNORMAL HIGH (ref 70–99)
Glucose-Capillary: 159 mg/dL — ABNORMAL HIGH (ref 70–99)
Glucose-Capillary: 162 mg/dL — ABNORMAL HIGH (ref 70–99)
Glucose-Capillary: 165 mg/dL — ABNORMAL HIGH (ref 70–99)
Glucose-Capillary: 182 mg/dL — ABNORMAL HIGH (ref 70–99)

## 2022-09-17 LAB — BASIC METABOLIC PANEL
Anion gap: 10 (ref 5–15)
BUN: 29 mg/dL — ABNORMAL HIGH (ref 8–23)
CO2: 27 mmol/L (ref 22–32)
Calcium: 8 mg/dL — ABNORMAL LOW (ref 8.9–10.3)
Chloride: 119 mmol/L — ABNORMAL HIGH (ref 98–111)
Creatinine, Ser: 1.05 mg/dL (ref 0.61–1.24)
GFR, Estimated: >60 mL/min (ref >60)
Glucose, Bld: 168 mg/dL — ABNORMAL HIGH (ref 70–99)
Potassium: 3.3 mmol/L — ABNORMAL LOW (ref 3.5–5.1)
Sodium: 156 mmol/L — ABNORMAL HIGH (ref 135–145)

## 2022-09-17 LAB — PHOSPHORUS: Phosphorus: 2.7 mg/dL (ref 2.5–4.6)

## 2022-09-17 LAB — HEPARIN LEVEL (UNFRACTIONATED)
Heparin Unfractionated: 0.47 IU/mL (ref 0.30–0.70)
Heparin Unfractionated: 0.41 IU/mL (ref 0.30–0.70)

## 2022-09-17 MED ORDER — POTASSIUM CHLORIDE 20 MEQ PO PACK
40.0000 meq | PACK | Freq: Once | ORAL | Status: AC
  Administered 2022-09-17: 40 meq
  Filled 2022-09-17: qty 2

## 2022-09-17 NOTE — Progress Notes (Addendum)
STROKE TEAM PROGRESS NOTE   INTERVAL HISTORY Patient is seen in his room with no family at the bedside.  He can intermittently follow commands and will answer questions by nodding or shaking his head.  He again nods his head in agreement when asked if he would like a tracheostomy.  Trach scheduled for Monday.  Patient has been hemodynamically stable and neurological exam remains stable.  Vitals:   09/17/22 1100 09/17/22 1119 09/17/22 1200 09/17/22 1215  BP: (!) 111/51  122/67   Pulse: 74  81 95  Resp: (!) 0  (!) 27 (!) 26  Temp:      TempSrc:      SpO2: 96% 96% 93% 91%  Weight:      Height:       CBC:  Recent Labs  Lab 09/14/22 0112 09/15/22 0311 09/17/22 0614  WBC 8.9 9.8 9.6  NEUTROABS 7.4 8.7*  --   HGB 10.2* 9.2* 9.6*  HCT 33.7* 30.3* 31.8*  MCV 88.7 89.4 88.6  PLT 149* 198 767    Basic Metabolic Panel:  Recent Labs  Lab 09/14/22 0112 09/14/22 0917 09/15/22 0311 09/15/22 0915 09/16/22 0343 09/17/22 0614  NA 153*   < > 156*   < > 157* 156*  K 3.6  --  3.3*  --   --  3.3*  CL 117*  --  121*  --   --  119*  CO2 26  --  32  --   --  27  GLUCOSE 285*  --  238*  --   --  168*  BUN 29*  --  28*  --   --  29*  CREATININE 1.03  --  1.09  --   --  1.05  CALCIUM 7.7*  --  8.0*  --   --  8.0*  MG 2.1  --  2.2  --   --   --   PHOS 1.9*  --  2.4*  --   --  2.7   < > = values in this interval not displayed.    Lipid Panel:  No results for input(s): "CHOL", "TRIG", "HDL", "CHOLHDL", "VLDL", "LDLCALC" in the last 168 hours.  HgbA1c:  No results for input(s): "HGBA1C" in the last 168 hours.  Urine Drug Screen: No results for input(s): "LABOPIA", "COCAINSCRNUR", "LABBENZ", "AMPHETMU", "THCU", "LABBARB" in the last 168 hours.  Alcohol Level No results for input(s): "ETH" in the last 168 hours.  IMAGING past 24 hours No results found.  PHYSICAL EXAM  General - Well nourished, well developed elderly African-American male intubated on low-dose Precedex for sedation    Ophthalmologic - fundi not visualized due to noncooperation.   Cardiovascular - irregularly irregular heart rate and rhythm.   Neuro - intubated on low-dose Precedex, drowsy but eyes open on voice, following simple commands. Eyes in more left gaze preference position, slowly tracking to right but not complete right gaze, pupils equal round and reactive to light. Gag and cough present.  Initiating breaths on CPAP mode on the vent.  Facial symmetry not able to test due to ET tube.  Tongue protrusion not cooperative. Follows simple commands and against gravity BUEs left greater than right, able to wiggle toes and bend knees on bilateral lower extremities.  Sensation, coordination not cooperative and gait not tested.     ASSESSMENT/PLAN [name] is a 87 y.o. male with history of  alcoholic cardiomyopathy, permanent atrial fibrillation on Eliquis, CHF with an EF 25 to 30%  with global hypokinesis, CAD s/p stenting, DM2, prior stroke, chronic bilateral lower extremity wounds with edema, LLE peroneal neuropathy with chronic foot drop, COPD, HLD and HTN who presented to the ED via EMS  after he was found down.  Patient remains intubated, and tracheostomy is planned for Monday.  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  CTA head & neck Occlusion of the left V4 segment and poor opacification of the basilar artery with apparent occlusion distally, age indeterminate. The bilateral PCAs are supplied by posterior communicating arteries with likely retrograde flow into the P1 segments.Multifocal high-grade stenosis of the right M2 and M3 branches   S/p IR with TICI2c reperfusion MRI - Multiple areas of acute infarct involving the cerebellum bilaterally, left pons, and right frontal white matter. Chronic infarcts in  the cerebellum bilaterally with evidence of prior hemorrhage. Large area of chronic infarct and mild chronic hemorrhage and in the right MCA territory.  MRA  - Basilar artery is patent now.   1/11- CT repeat - no hydrocephalus  1/12- CT repeat - no hydrocephalus CT Head 1/18-  Extensive recent infratentorial infarcts with petechial hemorrhage at the right lower cerebellum. The fourth ventricle remains patent. Large remote right MCA distribution infarct. 2D Echo EF 20-25%  LDL 56 HgbA1c 6.5 VTE prophylaxis - Heparin subq Eliquis (apixaban) daily prior to admission,   resumed AC with heparin IV 09/10/2022 and held again on 09/11/2022 after petechial hemorrhagic transformation, resumed on 1/19 Therapy recommendations: Pending Disposition:  Pending  Chronic Atrial Fibrillation  Home med: Eliquis Rate controlled Heparin IV  Hypernatremia Hypertonic saline discontinued 1/18 Na 157-> 156 CCM managing Likely related to Lasix  Congestive heart failure Alcoholic cardiomyopathy 03/5884 EF 25 to 30%.  This admission EF 20 to 25%. Home meds including Imdur, digoxin, Jardiance, lasix On Coreg, Lasix, Entresto, spironolactone CCM managing Pt follows with Dr. Aundra Dubin as outpt  Hypertension Home meds:  Lasix, coreg, aldactone Stable BP goal < 180/105 Long-term BP goal normotensive  Post operative respiratory insufficiency  Remains intubated post procedure CCM to manage ventilator settings Hx of COPD Extubate as able On Precedex Plan for trach next week  Hyperlipidemia Home meds:  Atorvastatin '80mg'$   LDL 56, goal < 70 AST/ALT 64/48-> 33/30 Resumed lipitor 80 Continue statin at discharge  Diabetes type II Controlled Home meds:  Jardiance, metformin HgbA1c 6.5, goal < 7.0 CBGs SSI  Dysphagia On tube feeding Plan for trach next week  Left upper extremity thrombophlebitis Febrile-Tmax 103F ID consulted Blood cultures for MSSA  On cefazolin   Other Stroke Risk  Factors Advanced Age >/= 64  Hx stroke/TIA Evidence of chronic infarcts on imaging Coronary artery disease status post stenting patient  Other Active Problems Chronic BLE wound Chronic b/l foot drop Recent left hip fracture - walking with walker at home Leukopenia WBC 3.8->7.3->5.6 -> 3.8->5.4-> 9.6 -> resolved Hypokalemia K 3.2 -> 3.7->3.6-> 3.3 -> repleted    Hospital Day #12   Patient seen and examined by NP/APP with MD. MD to update note as needed.   Haskins , MSN, AGACNP-BC [facility] See Amion for schedule and pager information 09/17/2022 1:34 PM   ATTENDING NOTE: I reviewed above note and agree with the assessment and plan. Pt was seen and examined.   RN at  bedside.  Patient still intubated but awake alert, eyes open,  following simple commands. Eyes in position, blinking to visual threat, bilateral gaze per request. Corneal reflex present, gag and cough present. Breathing over the vent.  Facial symmetry not able to test due to ET tube.  Tongue protrusion not cooperative. LUE 2/5 and RUE at least 3+/5. LLE withdraw to pain 2+/5 and RLE withdaw to pain 2/5. DTR 1+ and no babinski. Sensation, coordination not cooperative and gait not tested.    Patient currently on weaning trial, but still tachypneic and increased WOB, lethargic.  Plan for trach and PEG next week.  Continue heparin IV and statin.  Sodium high 156, CCM on board and managing, likely related to Lasix.  For detailed assessment and plan, please refer to above/below as I have made changes wherever appropriate.   Rosalin Hawking, MD PhD Stroke Neurology 09/17/2022 10:38 PM  This patient is critically ill due to cerebellar and brainstem infarct, status post thrombectomy, CHF, chronic A-fib, respite failure and at significant risk of neurological worsening, death form recurrent stroke, hemorrhagic transformation, hydrocephalus, heart failure, seizure. This patient's care requires constant  monitoring of vital signs, hemodynamics, respiratory and cardiac monitoring, review of multiple databases, neurological assessment, discussion with family, other specialists and medical decision making of high complexity. I spent 35 minutes of neurocritical care time in the care of this patient.

## 2022-09-17 NOTE — Progress Notes (Signed)
NAME:  Vincent [name], MRN:  [id number], DOB:  1935/11/03, LOS: 12 ADMISSION DATE:  09/25/2022, CONSULTATION DATE:  09/06/2022 REFERRING MD:  Marjory Lies - NIR CHIEF COMPLAINT:  S/p basilar artery occlusion, vent Management   History of Present Illness:  87 year old man who presented to [facility] ED 1/8 as a Code Stroke after being found down at home. Presented with R gaze preference, L-sided deficits and dysarthria. PMHx significant for HTN, HLD, AF (on Eliquis), CHF (EF 25-30%), CAD s/p stent (2004), prior CVA with residual L-sided deficits, COPD, T2DM.  CTA Head/Neck demonstrated distal basilar artery occlusion, left V4 occlusion. He was seen by neurology and IR and was taken to IR for revascularization of distal basilar artery; TICI 2C revascularization achieved. Post-procedure CT negative for hemorrhage.  Post-procedure, he was left intubated and was transferred to ICU.  PCCM was asked to see for vent management.  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 s/p remote stent to Cx 2004 with chronic stable angina in context of residual circumflex disease.   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 ([place]) 09/25/2015   Hyperlipidemia    Hypertension    Long term (current) use of anticoagulants    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/25/2015   NSVT (nonsustained ventricular tachycardia) (HCC)    Permanent atrial fibrillation (HCC)    Pulmonary eosinophilia (HCC)    Sinus bradycardia    Stroke [facility])    Syncope and collapse    Unspecified glaucoma(365.9)    Significant Hospital Events: Including procedures,  antibiotic start and stop dates in addition to other pertinent events   1/9 Admit, to VIR for revascularization of occluded basilar artery. 1/11 agitated with vent wean 1/12 less interactive. CT head with no acute findings. Failed SBT with tachypnea. Given lasix with 2L out  1/13 blood culture positive for MSSA 1/15 Repeat CT head (09/12/2022 05:41): Stable post-cerebellar infarction petechial hemorrhage (Heidelberg classification 1c: PH1), most apparent in the right cerebellar hemisphere. No significant posterior fossa mass effect or other complicating features. Recent bilateral cerebellar and brainstem infarcts with chronic supratentorial ischemia and no new intracranial abnormality identified.  1/15 started on hypertonic saline 1/16 family meeting with sister  1/18 repeat head CT - Extensive recent infratentorial infarcts with petechial hemorrhage at the right lower cerebellum. The fourth ventricle remains patent. Large remote right MCA distribution infarct.  1/19 No acute issues overnight   Interim History / Subjective:  Sister [name] really struggling with decision about one-way extubation vs reintubation/trach.  Minimal vent settings but has been tachypneic on PS  Heparin resumed 1/19  Objective:  Blood pressure 125/63, pulse 72, temperature 98.1 F (36.7 C), temperature source Axillary, resp. rate (!) 22, height '5\' 9"'$  (1.753 m), weight 71.6 kg, SpO2 97 %.    Vent Mode: PRVC FiO2 (%):  [30 %] 30 % Set Rate:  [15 bmp-26 bmp] 15 bmp Vt Set:  [560 mL] 560 mL PEEP:  [5 cmH20] 5 cmH20 Pressure Support:  [8 cmH20] 8 cmH20 Plateau Pressure:  [16 cmH20-21 cmH20] 20 cmH20  Intake/Output Summary (Last 24 hours) at 09/17/2022 0827 Last data filed at 09/17/2022 0630 Gross per 24 hour  Intake 1839.88 ml  Output 2025 ml  Net -185.12 ml   Filed Weights   09/06/22 0432 09/15/22 0500 09/17/22 0500  Weight: 75.7 kg 66.7 kg 71.6 kg   Physical Exam  General appearance: 87 y.o., male,  intubated chronically ill appearing HENT: ETT Lungs: mech breath sounds bl, equal chest rise CV: RRR, no murmur  Abdomen: Soft, non-tender; non-distended, BS present  Extremities: No peripheral edema, warm Skin: Normal turgor and texture; no rash Neuro: follows simple commands  Na 156 WBC 9.6   Assessment & Plan:   Bilateral large cerebellar infarct with left pontine infarct in the setting of basilar artery occlusion now s/p IR intervention Acute encephalopathy -CT scan 1/15 shows petechial bleeding and cerebellar infarcts P: Neuro following  Maintain neuro protective measures; goal for eurothermia, euglycemia, eunatermia, normoxia, and PCO2 goal of 35-40 Nutrition and bowel regimen Aspiration precautions  Continue ASA   Respiratory failure -Intubated during IR revascularization and has remained intubated due to waxing and weaning mentation  -Size of his stroke is extensive and may not be able to protect his airway History of COPD P: Continue ventilator support with lung protective strategies, pt (though wouldn't say he is decisional) and family leaning toward tracheostomy Head of bed elevated 30 degrees. Ensure adequate pulmonary hygiene  bronchodilators VAP bundle in place  PAD protocol, prn fentanyl and wean precedex for RASS -1   MSSA bacteremia -Cultures positive 1/13  P: Continue Cefazolin x4 weeks per ID   History of atrial fibrillation was on Eliquis P: IV heparin 1/19 resumed-   Heart failure with reduced ejection fraction -ECHO 1/15 with EF 20-25% with severally decreased LV function and global hypokinesis  Hypertension Hyperlipidemia P: Continuous telemetry  Continue ASA and statin Strict intake and output  Prn diuresis for net negative fluid balance Continue Spironolactone and Entresto  Home Imdur and Coreg remain on hold   Diabetes P: Continue SSI increase to resistance scale  TF coverage   Cephalic vein thrombosis P: IV heaprin 1/19,  warm compress  Hypokalemia  P: Supplement as needed  Trend bmet   Ongoing goals of care discussion  1/18: Patient is more aware and interactive today. I did ask him about being on the ventilator long-term and he does not want to be on the ventilator long-term. He did indicate that if we get him off the ventilator, does not want to be back on the ventilator even if it means he is going to die-conversation was in the presence of his nurse and the respiratory therapist. This was confirmed by Dr. Leonie Man- when he evaluated the patient as well 1/19 Sister at bedside discussion plan of care with palliative care. Introduced myself and gave a brief update. Sister is still struggling with making goals of care decision. She was leaning toward less aggressive plan of care yesterday but today is questioning that decision. Will continue to support family as goals of care are being discussed. Per Dr. Clydene Fake note later in the day pt and [name] wish to pursue trach/peg, remain full code.  Best practice (evaluated daily):  Diet/type: TF DVT prophylaxis: SCD GI prophylaxis: PPI Lines: N/A Foley:  External foley in place  Code Status:  full code Last date of multidisciplinary goals of care discussion: Per primary  Critical care:  CRITICAL CARE Performed by: Maryjane Hurter  Total critical care time: 33 minutes  Critical care  time was exclusive of separately billable procedures and treating other patients.  Critical care was necessary to treat or prevent imminent or life-threatening deterioration.  Critical care was time spent personally by me on the following activities: development of treatment plan with patient and/or surrogate as well as nursing, discussions with consultants, evaluation of patient's response to treatment, examination of patient, obtaining history from patient or surrogate, ordering and performing treatments and interventions, ordering and review of laboratory studies, ordering and  review of radiographic studies, pulse oximetry and re-evaluation of patient's condition.  [place]  Personal contact information can be found on Amion  If no contact or response made please call [phone] 09/17/2022, 8:27 AM

## 2022-09-17 NOTE — Plan of Care (Signed)
Patient received on PRVC ventilation as charted. Placed on PSV 8/5 wean this morning and has tolerated well. Remains on this setting at this time.

## 2022-09-17 NOTE — Progress Notes (Signed)
ANTICOAGULATION CONSULT NOTE  Pharmacy Consult:  Heparin Indication: atrial fibrillation  No Known Allergies  Patient Measurements: Height: '5\' 9"'$  (175.3 cm) Weight: 71.6 kg (157 lb 13.6 oz) IBW/kg (Calculated) : 70.7 Heparin Dosing Weight: 76 kg  Vital Signs: Temp: 98.7 F (37.1 C) (01/20 0800) Temp Source: Axillary (01/20 0800) BP: 122/67 (01/20 1200) Pulse Rate: 95 (01/20 1215)  Labs: Recent Labs    09/15/22 0311 09/16/22 1754 09/17/22 0614 09/17/22 1313  HGB 9.2*  --  9.6*  --   HCT 30.3*  --  31.8*  --   PLT 198  --  236  --   HEPARINUNFRC  --  0.24* 0.47 0.41  CREATININE 1.09  --  1.05  --      Estimated Creatinine Clearance: 52.4 mL/min (by C-G formula based on SCr of 1.05 mg/dL).   Assessment: 28 YOM with history of Afib on Eliquis PTA, last down unknown, prior to 1/8. Patient admitted with MCA CVA s/p thrombectomy on 1/9. He was started on IV heparin, which was then held due to large size of stroke. Pharmacy consulted to restart IV heparin 1/13 while Eliquis held. Heparin then held on 1/15 (and aspirin '325mg'$  daily continued) per Neurology with petechial hemorrhage. Pharmacy consulted to resume heparin infusion on 1/19 with patient improving, dosing per stroke protocol.  Confirmatory heparin level therapeutic (0.41) on infusion at 950 units/hr. No bleeding noted, Hgb stable 9s, platelets are normal.  Goal of Therapy:  Heparin level 0.3-0.5 units/ml Monitor platelets by anticoagulation protocol: Yes   Plan:  Continue heparin drip at 950 units/hr Daily heparin level, CBC Monitor for s/sx of bleeding  Thank you for involving pharmacy in this patient's care.  Renold Genta, PharmD, BCPS Clinical Pharmacist Clinical phone for 09/17/2022 is [phone] 09/17/2022 2:34 PM

## 2022-09-17 NOTE — Progress Notes (Signed)
Palliative Medicine Inpatient Follow Up Note HPI: 87 year old man PMHx significant for HTN, HLD, AF (on Eliquis), CHF (EF 25-30%), CAD s/p stent (2004), prior CVA with residual L-sided deficits, COPD, T2DM. Presented to [facility] ED 1/8 as a Code Stroke after being found down at home. Presented with R gaze preference, L-sided deficits and dysarthria. CTA Head/Neck demonstrated distal basilar artery occlusion, left V4 occlusion. He was seen by neurology and IR and was taken to IR for revascularization of distal basilar artery; TICI 2C revascularization achieved.    Palliative care asked to get involved to further help navigate goals in terms of possible extubation  Today's Discussion 09/17/2022  *Please note that this is a verbal dictation therefore any spelling or grammatical errors are due to the "[employer]" system interpretation.  Chart reviewed inclusive of vital signs, progress notes, laboratory results, and diagnostic images.   I met at bedside with Vincent [name] this morning. He was resting comfortably and appeared to be in no distress. He is able to follow directions accurately roughly 50% of the time upon my assessment. He does nod his head yes and shake his head no. When asked about what his wishes are he nods yes to specific questions related to chest compression, shocks, ACLS medications and continued ventilatory support if he were to suffer a cardiac or respiratory arrest. It is unclear how much he is able to formally understand and at this time not possible to do a formal capacity evaluation.  I have called patients sister, Vincent [name] for further conversation. Regarding additional interventions such as a tracheostomy and gastrostomy tube.  She called me back and shared that she plans on coming into the hospital later today. Vincent [name] vocalizes that she has made a decision though does not desire to talk about it over the phone and we agreed that I would meet with her at bedside when she gets to the  hospital later today.  I received a secure chat from patient's RN, Vincent [name] that patient's sister was at bedside though was not interested in speaking to the palliative care team.  I had stopped by the room.  She expressed that she has been her few hours and that she has a question for the primary medical team -I clarified whether she wants to speak to critical care or the neurology team.  She shared that she wanted to speak with Vincent [name].  I asked if I could be present further conversation to help guide with further conversations related to goals though she did not desire this at that time.  I asked Vincent [name] if we could continue our conversation regarding the decision she is made though she vocalizes not wanting to speak with me about those.  Questions and concerns addressed/Palliative Support Provided.   I kindly excused myself.  Objective Assessment: Vital Signs Vitals:   09/17/22 0800 09/17/22 0815  BP:    Pulse:    Resp:    Temp: 98.7 F (37.1 C)   SpO2:  96%    Intake/Output Summary (Last 24 hours) at 09/17/2022 1004 Last data filed at 09/17/2022 0630 Gross per 24 hour  Intake 1513.4 ml  Output 2025 ml  Net -511.6 ml   Last Weight  Most recent update: 09/17/2022  5:04 AM    Weight  71.6 kg (157 lb 13.6 oz)            Gen: Elderly chronically ill-appearing African-American male HEENT: ETT, core track, dry mucous membranes CV: Regular rate and rhythm PULM: Intubated  ABD: soft/nontender/nondistended EXT: No edema Neuro: Alert and able to squeeze hand as well as blink eyes though inconsistent  SUMMARY OF RECOMMENDATIONS   Full code --> for the time being patient's sister is considering the long-term of possible reintubation   Continue present plan of care   Ongoing PMT support --> working on establishing a relationship with patient's sister, Vincent [name]  though to date she does not seem interested in ongoing PMT involvement  Billing based on MDM:  High ___________________________________________________________________________ [place] Team Team Cell Phone: [phone] Please utilize secure chat with additional questions, if there is no response within 30 minutes please call the above phone number  Palliative Medicine Team providers are available by phone from 7am to 7pm daily and can be reached through the team cell phone.  Should this patient require assistance outside of these hours, please call the patient's attending physician.

## 2022-09-17 NOTE — Progress Notes (Signed)
  Interdisciplinary Goals of Care Family Meeting   Date carried out:: 09/17/2022  Location of the meeting: Bedside  Member's involved: Physician and Family Member or next of kin  Durable Power of Attorney or acting medical decision maker: Hrishikesh Hoeg   Discussion: We discussed goals of care for Vincent [name].  She's having hard time deciding if it would be most c/w his wishes to pursue trach/peg vs one-way extubation. I recommended against trach/peg in setting of his baseline multimorbidity, tachypnea on PS suggestive of need for prolonged ventilation even with trach, anticipated future quality of life that sounds inconsistent with what she thinks he values. She's still undecided says she'll try to decide in 1-2 days. Recommended against CPR/shocks in event of cardiac arrest which would not help him regain the kind of functional recovery anyone would want. She's still thinking it over.  Code status: Full Code  Disposition: Continue current acute care   Time spent for the meeting: 20 minutes  Maryjane Hurter 09/17/2022, 7:56 PM

## 2022-09-17 NOTE — Progress Notes (Signed)
ANTICOAGULATION CONSULT NOTE - Follow Up Consult  Pharmacy Consult for heparin Indication: atrial fibrillation in setting of stroke  Labs: Recent Labs    09/15/22 0311 09/16/22 1754 09/17/22 0614  HGB 9.2*  --   --   HCT 30.3*  --   --   PLT 198  --   --   HEPARINUNFRC  --  0.24* 0.47  CREATININE 1.09  --   --     Assessment/Plan:  87yo male therapeutic on heparin after rate change. Will continue infusion at current rate of 950 units/hr and confirm stable with additional level.   Wynona Neat, PharmD, BCPS  09/17/2022,6:57 AM

## 2022-09-18 DIAGNOSIS — I639 Cerebral infarction, unspecified: Secondary | ICD-10-CM | POA: Diagnosis not present

## 2022-09-18 DIAGNOSIS — J9601 Acute respiratory failure with hypoxia: Secondary | ICD-10-CM | POA: Diagnosis not present

## 2022-09-18 DIAGNOSIS — J9602 Acute respiratory failure with hypercapnia: Secondary | ICD-10-CM | POA: Diagnosis not present

## 2022-09-18 DIAGNOSIS — Z515 Encounter for palliative care: Secondary | ICD-10-CM | POA: Diagnosis not present

## 2022-09-18 DIAGNOSIS — J690 Pneumonitis due to inhalation of food and vomit: Secondary | ICD-10-CM | POA: Diagnosis not present

## 2022-09-18 DIAGNOSIS — I651 Occlusion and stenosis of basilar artery: Secondary | ICD-10-CM | POA: Diagnosis not present

## 2022-09-18 LAB — BASIC METABOLIC PANEL
Anion gap: 11 (ref 5–15)
BUN: 32 mg/dL — ABNORMAL HIGH (ref 8–23)
CO2: 28 mmol/L (ref 22–32)
Calcium: 8 mg/dL — ABNORMAL LOW (ref 8.9–10.3)
Chloride: 116 mmol/L — ABNORMAL HIGH (ref 98–111)
Creatinine, Ser: 1.17 mg/dL (ref 0.61–1.24)
GFR, Estimated: >60 mL/min (ref >60)
Glucose, Bld: 237 mg/dL — ABNORMAL HIGH (ref 70–99)
Potassium: 3.5 mmol/L (ref 3.5–5.1)
Sodium: 155 mmol/L — ABNORMAL HIGH (ref 135–145)

## 2022-09-18 LAB — CULTURE, BLOOD (ROUTINE X 2)
Culture: NO GROWTH
Culture: NO GROWTH
Special Requests: ADEQUATE
Special Requests: ADEQUATE

## 2022-09-18 LAB — GLUCOSE, CAPILLARY
Glucose-Capillary: 196 mg/dL — ABNORMAL HIGH (ref 70–99)
Glucose-Capillary: 246 mg/dL — ABNORMAL HIGH (ref 70–99)
Glucose-Capillary: 146 mg/dL — ABNORMAL HIGH (ref 70–99)
Glucose-Capillary: 129 mg/dL — ABNORMAL HIGH (ref 70–99)
Glucose-Capillary: 194 mg/dL — ABNORMAL HIGH (ref 70–99)
Glucose-Capillary: 213 mg/dL — ABNORMAL HIGH (ref 70–99)

## 2022-09-18 LAB — CBC
HCT: 30.3 % — ABNORMAL LOW (ref 39.0–52.0)
Hemoglobin: 8.6 g/dL — ABNORMAL LOW (ref 13.0–17.0)
MCH: 25.5 pg — ABNORMAL LOW (ref 26.0–34.0)
MCHC: 28.4 g/dL — ABNORMAL LOW (ref 30.0–36.0)
MCV: 89.9 fL (ref 80.0–100.0)
Platelets: 235 10*3/uL (ref 150–400)
RBC: 3.37 MIL/uL — ABNORMAL LOW (ref 4.22–5.81)
RDW: 17.9 % — ABNORMAL HIGH (ref 11.5–15.5)
WBC: 9.7 10*3/uL (ref 4.0–10.5)
nRBC: 0 % (ref 0.0–0.2)

## 2022-09-18 LAB — HEPARIN LEVEL (UNFRACTIONATED)
Heparin Unfractionated: 0.6 IU/mL (ref 0.30–0.70)
Heparin Unfractionated: 0.39 IU/mL (ref 0.30–0.70)

## 2022-09-18 MED ORDER — FREE WATER
200.0000 mL | Status: DC
  Administered 2022-09-18 – 2022-09-19 (×6): 200 mL

## 2022-09-18 MED ORDER — FUROSEMIDE 10 MG/ML IJ SOLN: 40.0000 mg | Freq: Once | INTRAMUSCULAR | Status: AC

## 2022-09-18 MED ORDER — INSULIN ASPART 100 UNIT/ML IJ SOLN
10.0000 [IU] | INTRAMUSCULAR | Status: DC
  Administered 2022-09-18 – 2022-09-19 (×5): 10 [IU] via SUBCUTANEOUS

## 2022-09-18 MED ORDER — INSULIN GLARGINE-YFGN 100 UNIT/ML ~~LOC~~ SOLN
20.0000 [IU] | Freq: Every day | SUBCUTANEOUS | Status: DC
  Filled 2022-09-18: qty 0.2

## 2022-09-18 MED ORDER — INSULIN GLARGINE-YFGN 100 UNIT/ML ~~LOC~~ SOLN
10.0000 [IU] | Freq: Every day | SUBCUTANEOUS | Status: DC
  Administered 2022-09-18: 10 [IU] via SUBCUTANEOUS
  Filled 2022-09-18 (×2): qty 0.1

## 2022-09-18 MED ORDER — VITAL 1.5 CAL PO LIQD
1000.0000 mL | ORAL | Status: DC
  Administered 2022-09-18: 1000 mL

## 2022-09-18 NOTE — Progress Notes (Signed)
ANTICOAGULATION CONSULT NOTE  Pharmacy Consult:  Heparin Indication: atrial fibrillation  No Known Allergies  Patient Measurements: Height: '5\' 9"'$  (175.3 cm) Weight: 65.8 kg (145 lb 1 oz) IBW/kg (Calculated) : 70.7 Heparin Dosing Weight: 76 kg  Vital Signs: Temp: 100.3 F (37.9 C) (01/21 0400) Temp Source: Axillary (01/21 0400) BP: 116/58 (01/21 0700) Pulse Rate: 66 (01/21 0700)  Labs: Recent Labs    09/17/22 0614 09/17/22 1313 09/18/22 0501  HGB 9.6*  --  8.6*  HCT 31.8*  --  30.3*  PLT 236  --  235  HEPARINUNFRC 0.47 0.41 0.60  CREATININE 1.05  --  1.17     Estimated Creatinine Clearance: 43.7 mL/min (by C-G formula based on SCr of 1.17 mg/dL).   Assessment: 58 YOM with history of Afib on Eliquis PTA, last down unknown, prior to 1/8. Patient admitted with MCA CVA s/p thrombectomy on 1/9. He was started on IV heparin, which was then held due to large size of stroke. Pharmacy consulted to restart IV heparin 1/13 while Eliquis held. Heparin then held on 1/15 (and aspirin '325mg'$  daily continued) per Neurology with petechial hemorrhage. Pharmacy consulted to resume heparin infusion on 1/19 with patient improving, dosing per stroke protocol.  Heparin level supratherapeutic at 0.6 on 950 units/hr. No bleeding noted, Hgb down to 8.6, platelets are normal. No problems with infusion noted per RN.  Goal of Therapy:  Heparin level 0.3-0.5 units/ml Monitor platelets by anticoagulation protocol: Yes   Plan:  Decrease heparin drip to 850 units/hr 8h heparin level Daily heparin level, CBC Monitor for s/sx of bleeding  Thank you for involving pharmacy in this patient's care.  Renold Genta, PharmD, BCPS Clinical Pharmacist Clinical phone for 09/18/2022 is [phone] 09/18/2022 7:23 AM

## 2022-09-18 NOTE — Progress Notes (Signed)
ANTICOAGULATION CONSULT NOTE  Pharmacy Consult:  Heparin Indication: atrial fibrillation  No Known Allergies  Patient Measurements: Height: '5\' 9"'$  (175.3 cm) Weight: 65.8 kg (145 lb 1 oz) IBW/kg (Calculated) : 70.7 Heparin Dosing Weight: 76 kg  Vital Signs: Temp: 99.9 F (37.7 C) (01/21 1200) Temp Source: Axillary (01/21 1200) BP: 125/67 (01/21 1600) Pulse Rate: 88 (01/21 1600)  Labs: Recent Labs    09/17/22 0614 09/17/22 1313 09/18/22 0501 09/18/22 1543  HGB 9.6*  --  8.6*  --   HCT 31.8*  --  30.3*  --   PLT 236  --  235  --   HEPARINUNFRC 0.47 0.41 0.60 0.39  CREATININE 1.05  --  1.17  --      Estimated Creatinine Clearance: 43.7 mL/min (by C-G formula based on SCr of 1.17 mg/dL).   Assessment: 69 YOM with history of Afib on Eliquis PTA, last down unknown, prior to 1/8. Patient admitted with MCA CVA s/p thrombectomy on 1/9. He was started on IV heparin, which was then held due to large size of stroke. Pharmacy consulted to restart IV heparin 1/13 while Eliquis held. Heparin then held on 1/15 (and aspirin '325mg'$  daily continued) per Neurology with petechial hemorrhage. Pharmacy consulted to resume heparin infusion on 1/19 with patient improving, dosing per stroke protocol.  Heparin level supratherapeutic at 0.6 on 950 units/hr. No bleeding noted, Hgb down to 8.6, platelets are normal. No problems with infusion noted per RN.  Heparin level came back therapeutic tonight. We will cont with the current dose.  Goal of Therapy:  Heparin level 0.3-0.5 units/ml Monitor platelets by anticoagulation protocol: Yes   Plan:  Cont heparin drip 850 units/hr Daily heparin level, CBC Monitor for s/sx of bleeding  Onnie Boer, PharmD, BCIDP, AAHIVP, CPP Infectious Disease Pharmacist 09/18/2022 5:07 PM

## 2022-09-18 NOTE — Progress Notes (Addendum)
STROKE TEAM PROGRESS NOTE   INTERVAL HISTORY Patient is seen in his room with no family at the bedside.  He has been hemodynamically stable but is not able to remain on weaning mode from the ventilator.  His neurological exam is unchanged, and he is able to follow simple commands consistently.  Tracheostomy planned for tomorrow.  Vitals:   09/18/22 1000 09/18/22 1100 09/18/22 1200 09/18/22 1300  BP: 109/84 (!) 105/56 (!) 110/57 122/72  Pulse: 90 96 80 95  Resp: (!) 30 (!) 29 (!) 26 (!) 26  Temp:   99.9 F (37.7 C)   TempSrc:   Axillary   SpO2: 96% 91% 93% 93%  Weight:      Height:       CBC:  Recent Labs  Lab 09/14/22 0112 09/15/22 0311 09/17/22 0614 09/18/22 0501  WBC 8.9 9.8 9.6 9.7  NEUTROABS 7.4 8.7*  --   --   HGB 10.2* 9.2* 9.6* 8.6*  HCT 33.7* 30.3* 31.8* 30.3*  MCV 88.7 89.4 88.6 89.9  PLT 149* 198 236 431    Basic Metabolic Panel:  Recent Labs  Lab 09/14/22 0112 09/14/22 0917 09/15/22 0311 09/15/22 0915 09/17/22 0614 09/18/22 0501  NA 153*   < > 156*   < > 156* 155*  K 3.6  --  3.3*  --  3.3* 3.5  CL 117*  --  121*  --  119* 116*  CO2 26  --  32  --  27 28  GLUCOSE 285*  --  238*  --  168* 237*  BUN 29*  --  28*  --  29* 32*  CREATININE 1.03  --  1.09  --  1.05 1.17  CALCIUM 7.7*  --  8.0*  --  8.0* 8.0*  MG 2.1  --  2.2  --   --   --   PHOS 1.9*  --  2.4*  --  2.7  --    < > = values in this interval not displayed.    Lipid Panel:  No results for input(s): "CHOL", "TRIG", "HDL", "CHOLHDL", "VLDL", "LDLCALC" in the last 168 hours.  HgbA1c:  No results for input(s): "HGBA1C" in the last 168 hours.  Urine Drug Screen: No results for input(s): "LABOPIA", "COCAINSCRNUR", "LABBENZ", "AMPHETMU", "THCU", "LABBARB" in the last 168 hours.  Alcohol Level No results for input(s): "ETH" in the last 168 hours.  IMAGING past 24 hours No results found.  PHYSICAL EXAM  General - Well nourished, well developed elderly African-American male intubated on  low-dose Precedex for sedation   Cardiovascular - irregularly irregular heart rate and rhythm.   Neuro - intubated on low-dose Precedex, drowsy but eyes open on voice, following simple commands.  Extraocular movements intact today, pupils equal round and reactive to light. Gag and cough present.  Initiating breaths on CPAP mode on the vent.  Facial symmetry not able to test due to ET tube.  Tongue protrusion not cooperative. Follows simple commands and against gravity BUEs left greater than right, able to wiggle toes and bend knees on bilateral lower extremities.  Sensation, coordination not cooperative and gait not tested.   ASSESSMENT/PLAN Vincent [name] is a 87 y.o. male with history of  alcoholic cardiomyopathy, permanent atrial fibrillation on Eliquis, CHF with an EF 25 to 30% with global hypokinesis, CAD s/p stenting, DM2, prior stroke, chronic bilateral lower extremity wounds with edema, LLE peroneal neuropathy with chronic foot drop, COPD, HLD and HTN who presented to the ED  via EMS  after he was found down.  Patient remains intubated, and tracheostomy is planned for Monday.  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  CTA head & neck Occlusion of the left V4 segment and poor opacification of the basilar artery with apparent occlusion distally, age indeterminate. The bilateral PCAs are supplied by posterior communicating arteries with likely retrograde flow into the P1 segments.Multifocal high-grade stenosis of the right M2 and M3 branches   S/p IR with TICI2c reperfusion MRI - Multiple areas of acute infarct involving the cerebellum bilaterally, left pons, and right frontal white matter. 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 Large area of chronic infarct and  mild chronic hemorrhage and in the right MCA territory.  MRA  - Basilar artery is patent now.   1/11- CT repeat - no hydrocephalus  1/12- CT repeat - no hydrocephalus CT Head 1/18-  Extensive recent infratentorial infarcts with petechial hemorrhage at the right lower cerebellum. The fourth ventricle remains patent. Large remote right MCA distribution infarct. 2D Echo EF 20-25%  LDL 56 HgbA1c 6.5 VTE prophylaxis - Heparin subq Eliquis (apixaban) daily prior to admission, now on heparin IV Therapy recommendations: Pending Disposition:  Pending  Chronic Atrial Fibrillation  Home med: Eliquis Rate controlled On heparin IV  Hypernatremia Hypertonic saline discontinued 1/18 Na 157-> 156-> 155 CCM managing On free water Likely related to Lasix  Congestive heart failure Alcoholic cardiomyopathy 01/453 EF 25 to 30%.  This admission EF 20 to 25%. Home meds including Imdur, digoxin, Jardiance, lasix On Coreg, Lasix, Entresto, spironolactone CCM managing Pt follows with Dr. Aundra Dubin as outpt  Hypertension Home meds:  Lasix, coreg, aldactone Stable BP goal < 180/105 Long-term BP goal normotensive  Post operative respiratory insufficiency  Remains intubated post procedure CCM to manage ventilator settings Hx of COPD Extubate as able On Precedex Plan for trach tomorrow  Hyperlipidemia Home meds:  Atorvastatin '80mg'$   LDL 56, goal < 70 AST/ALT 64/48-> 33/30 Resumed lipitor 80 Continue statin at discharge  Diabetes type II Controlled Home meds:  Jardiance, metformin HgbA1c 6.5, goal < 7.0 CBGs SSI  Dysphagia On tube feeding Plan for PEG next week  Left upper extremity thrombophlebitis Febrile-Tmax 103F ID consulted Blood cultures for MSSA  On cefazolin   Other Stroke Risk Factors Advanced Age >/= 85  Hx stroke/TIA Evidence of chronic infarcts on imaging Coronary artery disease status post stenting patient  Other Active Problems Chronic BLE wound Chronic b/l  foot drop Recent left hip fracture - walking with walker at home Leukopenia WBC 3.8->7.3->5.6 -> 3.8->5.4-> 9.6 -> resolved Hypokalemia K 3.2 -> 3.7->3.6-> 3.3 -> 3.5    Hospital Day #13   Patient seen and examined by NP/APP with MD. MD to update note as needed.   [place] , MSN, AGACNP-BC [facility] See Amion for schedule and pager information 09/18/2022 1:11 PM   ATTENDING NOTE: I reviewed above note and agree with the assessment and plan. Pt was seen and examined.   No family at bedside.  Patient did not tolerating SBT this morning with tachypnea and tachycardia, now back to vent.  Eyes open, awake alert, follows simple commands, nodding for questions, however whole body weakness left arm weaker than right arm, right leg weaker than left  leg.  Still has hyponatremia, put on free water flush.  Plan for trach and PEG tomorrow.  For detailed assessment and plan, please refer to above/below as I have made changes wherever appropriate.   Vincent Hawking, MD PhD Stroke Neurology 09/18/2022 11:37 PM   This patient is critically ill due to cerebellar and brainstem infarct, status post thrombectomy, CHF, chronic A-fib, respite failure and at significant risk of neurological worsening, death form recurrent stroke, hemorrhagic transformation, hydrocephalus, heart failure, seizure. This patient's care requires constant monitoring of vital signs, hemodynamics, respiratory and cardiac monitoring, review of multiple databases, neurological assessment, discussion with family, other specialists and medical decision making of high complexity. I spent 35 minutes of neurocritical care time in the care of this patient.

## 2022-09-18 NOTE — Progress Notes (Signed)
Palliative Medicine Inpatient Follow Up Note HPI: 87 year old man PMHx significant for HTN, HLD, AF (on Eliquis), CHF (EF 25-30%), CAD s/p stent (2004), prior CVA with residual L-sided deficits, COPD, T2DM. Presented to [facility] ED 1/8 as a Code Stroke after being found down at home. Presented with R gaze preference, L-sided deficits and dysarthria. CTA Head/Neck demonstrated distal basilar artery occlusion, left V4 occlusion. He was seen by neurology and IR and was taken to IR for revascularization of distal basilar artery; TICI 2C revascularization achieved.    Palliative care asked to get involved to further help navigate goals in terms of possible extubation  Today's Discussion 09/18/2022  *Please note that this is a verbal dictation therefore any spelling or grammatical errors are due to the "[place]" system interpretation.  Chart reviewed inclusive of vital signs, progress notes, laboratory results, and diagnostic images.   I met at bedside with Vincent [name] this morning. He is somnolent though does arouse. He is able to follow directions. He does appear to desire continued care, though again. I am unable to assess capacity properly.   I spoke to patients sister, Vincent [name] this afternoon. She was very upset that I came to bedside during a conversation she was having on the phone. I expressed that I want to verify the wishes for Vincent [name] moving forward. She states that she would like to pursue a tracheostomy and PEG tube. She asks when this will happen and I am unable to provide these answers other than the projected plan for tomorrow.   Vincent [name] perseverated for quite sometime regarding wanting to speak to her during her phone call. She also feels things have been shared with Vincent [name] when she has not been present about his prognosis. I vocalized if done, this was not done by myself.   It appears that the PMT upsets Vincent [name] therefore I shared now that this very difficult decision have been made we will  step back. I vocalized that I can be called at any time if additional concerns arise or Palliative support is needed.   Objective Assessment: Vital Signs Vitals:   09/18/22 1200 09/18/22 1300  BP: (!) 110/57 122/72  Pulse: 80 95  Resp: (!) 26 (!) 26  Temp: 99.9 F (37.7 C)   SpO2: 93% 93%    Intake/Output Summary (Last 24 hours) at 09/18/2022 1524 Last data filed at 09/18/2022 1300 Gross per 24 hour  Intake 2356.73 ml  Output 3015 ml  Net -658.27 ml    Last Weight  Most recent update: 09/18/2022  5:20 AM    Weight  65.8 kg (145 lb 1 oz)            Gen: Elderly chronically ill-appearing African-American male HEENT: ETT, core track, dry mucous membranes CV: Regular rate and rhythm PULM: Intubated ABD: soft/nontender/nondistended EXT: No edema Neuro: Alert and able to squeeze hand as well as blink eyes though inconsistent  SUMMARY OF RECOMMENDATIONS   Full code    Plan for to pursue tracheostomy and PEG placement in the oncoming week   It appears our teams involvement upsets patients sister, Vincent [name] at this time --> PMT will sign off given clarity of goals for full scope of care with all aggressive interventions  Billing based on MDM: High ___________________________________________________________________________ [place] Team Team Cell Phone: [phone] Please utilize secure chat with additional questions, if there is no response within 30 minutes please call the above phone number  Palliative Medicine Team providers are available  by phone from 7am to 7pm daily and can be reached through the team cell phone.  Should this patient require assistance outside of these hours, please call the patient's attending physician.

## 2022-09-18 NOTE — Progress Notes (Signed)
NAME:  Vincent [name], MRN:  [id number], DOB:  08-12-1936, LOS: 60 ADMISSION DATE:  08/30/2022, CONSULTATION DATE:  09/06/2022 REFERRING MD:  Marjory Lies - NIR CHIEF COMPLAINT:  S/p basilar artery occlusion, vent Management   History of Present Illness:  87 year old man who presented to [facility] ED 1/8 as a Code Stroke after being found down at home. Presented with R gaze preference, L-sided deficits and dysarthria. PMHx significant for HTN, HLD, AF (on Eliquis), CHF (EF 25-30%), CAD s/p stent (2004), prior CVA with residual L-sided deficits, COPD, T2DM.  CTA Head/Neck demonstrated distal basilar artery occlusion, left V4 occlusion. He was seen by neurology and IR and was taken to IR for revascularization of distal basilar artery; TICI 2C revascularization achieved. Post-procedure CT negative for hemorrhage.  Post-procedure, he was left intubated and was transferred to ICU.  PCCM was asked to see for vent management.  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 s/p remote stent to Cx 2004 with chronic stable angina in context of residual circumflex disease.   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 ([place]) 09/25/2015   Hyperlipidemia    Hypertension    Long term (current) use of anticoagulants    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/25/2015   NSVT (nonsustained ventricular tachycardia) (HCC)    Permanent atrial fibrillation (HCC)    Pulmonary eosinophilia (HCC)    Sinus bradycardia    Stroke [facility])    Syncope and collapse    Unspecified glaucoma(365.9)    Significant Hospital Events: Including procedures,  antibiotic start and stop dates in addition to other pertinent events   1/9 Admit, to VIR for revascularization of occluded basilar artery. 1/11 agitated with vent wean 1/12 less interactive. CT head with no acute findings. Failed SBT with tachypnea. Given lasix with 2L out  1/13 blood culture positive for MSSA 1/15 Repeat CT head (09/12/2022 05:41): Stable post-cerebellar infarction petechial hemorrhage (Heidelberg classification 1c: PH1), most apparent in the right cerebellar hemisphere. No significant posterior fossa mass effect or other complicating features. Recent bilateral cerebellar and brainstem infarcts with chronic supratentorial ischemia and no new intracranial abnormality identified.  1/15 started on hypertonic saline 1/16 family meeting with sister  1/18 repeat head CT - Extensive recent infratentorial infarcts with petechial hemorrhage at the right lower cerebellum. The fourth ventricle remains patent. Large remote right MCA distribution infarct.  1/19 No acute issues overnight   Interim History / Subjective:  Tolerating PS this am Low dose Precedex  Objective:  Blood pressure 127/65, pulse 74, temperature 99.2 F (37.3 C), temperature source Axillary, resp. rate (!) 32, height '5\' 9"'$  (1.753 m), weight 65.8 kg, SpO2 97 %.    Vent Mode: PSV;CPAP FiO2 (%):  [30 %] 30 % Set Rate:  [15 bmp] 15 bmp Vt Set:  [560 mL] 560 mL PEEP:  [5 cmH20] 5 cmH20 Pressure Support:  [8 cmH20] 8 cmH20 Plateau Pressure:  [21 cmH20] 21 cmH20   Intake/Output Summary (Last 24 hours) at 09/18/2022 0940 Last data filed at 09/18/2022 0800 Gross per 24 hour  Intake  1968.24 ml  Output 3015 ml  Net -1046.76 ml   Filed Weights   09/15/22 0500 09/17/22 0500 09/18/22 0500  Weight: 66.7 kg 71.6 kg 65.8 kg   Physical Exam: General: Critically ill-appearing, no acute distress HENT: [place], AT, ETT in place Eyes: EOMI, no scleral icterus Respiratory: Clear to auscultation bilaterally.  No crackles,  wheezing or rales Cardiovascular: RRR, -M/R/G, no JVD GI: BS+, soft, nontender Extremities:-Edema,-tenderness Neuro: Eyes open, PERRL, follows commands, moves extremities x 4  Na 155 WBC 9.6   Assessment & Plan:   Bilateral large cerebellar infarct with left pontine infarct in the setting of basilar artery occlusion now s/p IR intervention Acute encephalopathy -CT scan 1/15 shows petechial bleeding and cerebellar infarcts P: Neuro following  Maintain neuro protective measures; goal for eurothermia, euglycemia, eunatermia, normoxia, and PCO2 goal of 35-40 Nutrition and bowel regimen Aspiration precautions  Continue ASA   Respiratory failure -Intubated during IR revascularization and has remained intubated due to waxing and weaning mentation  -Size of his stroke is extensive and may not be able to protect his airway History of COPD P: See IPAL 1/20. Discussed trach. Family struggling with decision. Remains full code Continue full vent support with lung protective strategies Head of bed elevated 30 degrees. Ensure adequate pulmonary hygiene  bronchodilators VAP bundle in place  PAD protocol, prn fentanyl and wean precedex for RASS -1  MSSA bacteremia -Cultures positive 1/13  P: Continue Cefazolin x4 weeks per ID   History of atrial fibrillation was on Eliquis P: IV heparin 1/19 resumed-   Heart failure with reduced ejection fraction -ECHO 1/15 with EF 20-25% with severally decreased LV function and global hypokinesis  Hypertension Hyperlipidemia P: Continuous telemetry  Continue ASA and statin Strict intake and output  Lasix today for goal net even/negative. Reassess daily Continue Spironolactone and Entresto  Continue home coreg Home Imdur remain on hold   Diabetes P: Continue SSI increase to resistance scale  Increase TF coverage  Basal insulin  Hiccups P: Thorazine  Cephalic vein thrombosis P: IV heaprin 1/19, warm compress  Hypokalemia   P: Supplement as needed  Trend bmet   Ongoing goals of care discussion  1/18: Patient is more aware and interactive today. I did ask him about being on the ventilator long-term and he does not want to be on the ventilator long-term. He did indicate that if we get him off the ventilator, does not want to be back on the ventilator even if it means he is going to die-conversation was in the presence of his nurse and the respiratory therapist. This was confirmed by Dr. Leonie Man- when he evaluated the patient as well 1/19 Sister at bedside discussion plan of care with palliative care. Introduced myself and gave a brief update. Sister is still struggling with making goals of care decision. She was leaning toward less aggressive plan of care yesterday but today is questioning that decision. Will continue to support family as goals of care are being discussed. Per Dr. Clydene Fake note later in the day pt and [name] wish to pursue trach/peg, remain full code.  Best practice (evaluated daily):  Diet/type: TF DVT prophylaxis: SCD GI prophylaxis: PPI Lines: N/A Foley:  External foley in place  Code Status:  full code Last date of multidisciplinary goals of care discussion: Per primary  Critical care:   The patient is critically ill with multiple organ systems failure and requires high complexity decision making for assessment and support, frequent evaluation and titration of therapies, application  of advanced monitoring technologies and extensive interpretation of multiple databases.  Independent Critical Care Time: 35 Minutes.   Rodman Pickle, M.D. [facility] Pulmonary/Critical Care Medicine 09/18/2022 9:40 AM   Please see Amion for pager number to reach on-call Pulmonary and Critical Care Team.

## 2022-09-18 NOTE — Progress Notes (Signed)
Pt transported to and from MRI on the ventilator. °

## 2022-09-18 NOTE — Progress Notes (Signed)
Nutrition Brief Note  RD received notice from pharmacy that hospital is out of stock of Osmolite 1.5. Unit does not have any floor stock. RD to make appropriate substitution.    Tube Feeds via Cortrak Tube: - Switch to Vital 1.5 at 50 mL/hr (1200 mL/day) -ProSource TF20 - BID Provides 1960 kcal, 121 gm protein, and 917 mL free water daily.   Hermina Barters RD, LDN Clinical Dietitian See Shea Evans for contact information.

## 2022-09-18 NOTE — Progress Notes (Signed)
[facility] Progress Note Patient Name: Vincent [name] DOB: Feb 19, 1936 MRN: [id number]   Date of Service  09/18/2022  HPI/Events of Note  Sodium down to 155, asking for free water. I would avoid it due to cerebellar infarcts, avoiding hypotonic solutions, unless if ok by Neurologist.   eICU Interventions       Intervention Category Intermediate Interventions: Electrolyte abnormality - evaluation and management  Elmer Sow 09/18/2022, 6:29 AM

## 2022-09-18 NOTE — Progress Notes (Signed)
Pt breathing 40x per minute despite increase in precedex. RT made aware, pt flipped back to previous settings under advisement of RT. Will monitor.

## 2022-09-19 DIAGNOSIS — I6322 Cerebral infarction due to unspecified occlusion or stenosis of basilar arteries: Secondary | ICD-10-CM

## 2022-09-19 DIAGNOSIS — J690 Pneumonitis due to inhalation of food and vomit: Secondary | ICD-10-CM | POA: Diagnosis not present

## 2022-09-19 DIAGNOSIS — I639 Cerebral infarction, unspecified: Secondary | ICD-10-CM | POA: Diagnosis not present

## 2022-09-19 DIAGNOSIS — I651 Occlusion and stenosis of basilar artery: Secondary | ICD-10-CM | POA: Diagnosis not present

## 2022-09-19 LAB — CBC
HCT: 30.4 % — ABNORMAL LOW (ref 39.0–52.0)
Hemoglobin: 9.1 g/dL — ABNORMAL LOW (ref 13.0–17.0)
MCH: 26.7 pg (ref 26.0–34.0)
MCHC: 29.9 g/dL — ABNORMAL LOW (ref 30.0–36.0)
MCV: 89.1 fL (ref 80.0–100.0)
Platelets: 231 10*3/uL (ref 150–400)
RBC: 3.41 MIL/uL — ABNORMAL LOW (ref 4.22–5.81)
RDW: 17.8 % — ABNORMAL HIGH (ref 11.5–15.5)
WBC: 8.1 10*3/uL (ref 4.0–10.5)
nRBC: 0 % (ref 0.0–0.2)

## 2022-09-19 LAB — BASIC METABOLIC PANEL
Anion gap: 5 (ref 5–15)
BUN: 33 mg/dL — ABNORMAL HIGH (ref 8–23)
CO2: 29 mmol/L (ref 22–32)
Calcium: 7.8 mg/dL — ABNORMAL LOW (ref 8.9–10.3)
Chloride: 116 mmol/L — ABNORMAL HIGH (ref 98–111)
Creatinine, Ser: 1.16 mg/dL (ref 0.61–1.24)
GFR, Estimated: >60 mL/min (ref >60)
Glucose, Bld: 140 mg/dL — ABNORMAL HIGH (ref 70–99)
Potassium: 3.1 mmol/L — ABNORMAL LOW (ref 3.5–5.1)
Sodium: 150 mmol/L — ABNORMAL HIGH (ref 135–145)

## 2022-09-19 LAB — GLUCOSE, CAPILLARY
Glucose-Capillary: 150 mg/dL — ABNORMAL HIGH (ref 70–99)
Glucose-Capillary: 81 mg/dL (ref 70–99)
Glucose-Capillary: 80 mg/dL (ref 70–99)
Glucose-Capillary: 159 mg/dL — ABNORMAL HIGH (ref 70–99)

## 2022-09-19 LAB — HEPARIN LEVEL (UNFRACTIONATED): Heparin Unfractionated: 0.47 IU/mL (ref 0.30–0.70)

## 2022-09-19 MED ORDER — SODIUM CHLORIDE 0.9 % IV SOLN: INTRAVENOUS | Status: DC

## 2022-09-19 MED ORDER — ONDANSETRON HCL 4 MG/2ML IJ SOLN: 4.0000 mg | Freq: Four times a day (QID) | INTRAMUSCULAR | Status: DC | PRN

## 2022-09-19 MED ORDER — MORPHINE 100MG IN NS 100ML (1MG/ML) PREMIX INFUSION
0.0000 mg/h | INTRAVENOUS | Status: DC
  Administered 2022-09-19: 5 mg/h via INTRAVENOUS
  Filled 2022-09-19: qty 100

## 2022-09-19 MED ORDER — GLYCOPYRROLATE 1 MG PO TABS: 1.0000 mg | ORAL_TABLET | ORAL | Status: DC | PRN

## 2022-09-19 MED ORDER — GLYCOPYRROLATE 0.2 MG/ML IJ SOLN
0.2000 mg | INTRAMUSCULAR | Status: DC | PRN
  Administered 2022-09-19 (×2): 0.2 mg via INTRAVENOUS
  Filled 2022-09-19 (×2): qty 1

## 2022-09-19 MED ORDER — ACETAMINOPHEN 650 MG RE SUPP: 650.0000 mg | Freq: Four times a day (QID) | RECTAL | Status: DC | PRN

## 2022-09-19 MED ORDER — POLYVINYL ALCOHOL 1.4 % OP SOLN: 1.0000 [drp] | Freq: Four times a day (QID) | OPHTHALMIC | Status: DC | PRN

## 2022-09-19 MED ORDER — POTASSIUM CHLORIDE 20 MEQ PO PACK
20.0000 meq | PACK | ORAL | Status: AC
  Administered 2022-09-19 (×2): 20 meq
  Filled 2022-09-19 (×2): qty 1

## 2022-09-19 MED ORDER — ONDANSETRON 4 MG PO TBDP: 4.0000 mg | ORAL_TABLET | Freq: Four times a day (QID) | ORAL | Status: DC | PRN

## 2022-09-19 MED ORDER — MORPHINE BOLUS VIA INFUSION
5.0000 mg | INTRAVENOUS | Status: DC | PRN
  Administered 2022-09-19: 5 mg via INTRAVENOUS

## 2022-09-19 MED ORDER — MORPHINE SULFATE (PF) 4 MG/ML IV SOLN
4.0000 mg | Freq: Once | INTRAVENOUS | Status: AC
  Administered 2022-09-19: 4 mg via INTRAVENOUS
  Filled 2022-09-19: qty 1

## 2022-09-19 MED ORDER — INSULIN GLARGINE-YFGN 100 UNIT/ML ~~LOC~~ SOLN
10.0000 [IU] | Freq: Every day | SUBCUTANEOUS | Status: DC
  Filled 2022-09-19: qty 0.1

## 2022-09-19 MED ORDER — ACETAMINOPHEN 325 MG PO TABS: 650.0000 mg | ORAL_TABLET | Freq: Four times a day (QID) | ORAL | Status: DC | PRN

## 2022-09-19 MED ORDER — POTASSIUM CHLORIDE 10 MEQ/100ML IV SOLN
10.0000 meq | INTRAVENOUS | Status: AC
  Administered 2022-09-19 (×4): 10 meq via INTRAVENOUS
  Filled 2022-09-19 (×4): qty 100

## 2022-09-19 MED ORDER — MIDAZOLAM HCL 2 MG/2ML IJ SOLN
2.0000 mg | INTRAMUSCULAR | Status: DC | PRN
  Administered 2022-09-19: 2 mg via INTRAVENOUS
  Filled 2022-09-19: qty 2

## 2022-09-19 MED ORDER — GLYCOPYRROLATE 0.2 MG/ML IJ SOLN: 0.2000 mg | INTRAMUSCULAR | Status: DC | PRN

## 2022-09-29 NOTE — Progress Notes (Addendum)
ANTICOAGULATION CONSULT NOTE  Pharmacy Consult:  Heparin Indication: atrial fibrillation  No Known Allergies  Patient Measurements: Height: '5\' 9"'$  (175.3 cm) Weight: 66.9 kg (147 lb 7.8 oz) IBW/kg (Calculated) : 70.7 Heparin Dosing Weight: 76 kg  Vital Signs: Temp: 99.5 F (37.5 C) Oct 07, 2022 0800) Temp Source: Axillary Oct 07, 2022 0800) BP: 110/51 October 07, 2022 0800) Pulse Rate: 67 10-07-22 0800)  Labs: Recent Labs    09/17/22 0614 09/17/22 1313 09/18/22 0501 09/18/22 1543 10-07-2022 0435  HGB 9.6*  --  8.6*  --  9.1*  HCT 31.8*  --  30.3*  --  30.4*  PLT 236  --  235  --  231  HEPARINUNFRC 0.47   < > 0.60 0.39 0.47  CREATININE 1.05  --  1.17  --  1.16   < > = values in this interval not displayed.     Estimated Creatinine Clearance: 44.9 mL/min (by C-G formula based on SCr of 1.16 mg/dL).   Assessment: 22 YOM with history of Afib on Eliquis PTA. Patient admitted with MCA CVA s/p thrombectomy on 1/9. He was started on IV heparin, which was then held due to large size of stroke. Pharmacy consulted to restart IV heparin 1/13 while Eliquis held. Heparin then held on 1/15 (and aspirin '325mg'$  daily continued) per Neurology with petechial hemorrhage. Pharmacy consulted to resume heparin infusion on 1/19 with patient improving, dosing per stroke protocol.  Heparin level therapeutic at 0.47 on 850 units/hr, then heparin infusion was turned off. No bleeding noted; CBC stable  Goal of Therapy:  Heparin level 0.3-0.5 units/ml Monitor platelets by anticoagulation protocol: Yes   Plan:  Heparin off for procedure - f/u with resuming Monitor for s/sx of bleeding   Whit Bruni D. Mina Marble, PharmD, BCPS, [place] 10/07/22, 8:25 AM  ==========================  Addendum: Decided on a trial of extubation and not proceed with trach Heparin resumed at previous rate F/U AM labs  Lawrencia Mauney D. Mina Marble, PharmD, BCPS, [place] 2022-10-07, 1:29 PM

## 2022-09-29 NOTE — Plan of Care (Signed)
Overnight on-call note  Notified by patient's RN-patient passed away-time of death 13-Nov-2206 hrs.  Dr. Erlinda Hong and the stroke team will complete the death note and the death certificate.  -- Amie Portland, MD Neurologist [facility] Pager: [phone]

## 2022-09-29 NOTE — IPAL (Signed)
  Interdisciplinary Goals of Care Family Meeting   Date carried out: 10-05-22  Location of the meeting: Bedside  Member's involved: Physician, Bedside Registered Nurse, and Family Member or next of kin  Durable Power of Attorney or acting medical decision maker: n/a - sister next of kin present    Discussion: We discussed goals of care for SHASHANK KWASNIK .  Mr. Pirro has become delirious.  Sister states not recognizing him as well.  He is unable to cough.  Unable to clear secretions.  I discussed concern for terminal delirium, sign of active dying.  We discussed like this morning making him comfortable.  Comfort measures only.  She expressed understanding.  So we did use medicines to make sure he is comfortable, treat any pain, anxiety, discomfort.  Acknowledging this may mean he would pass away sooner rather than later.  Discussed anticipate in-hospital death but could pursue possible hospice inpatient placement.  She expressed understanding.  Full comfort measure orders placed, morphine drip, additional medications ordered.  Code status:   Code Status: DNR   Disposition: Continue current acute care  Time spent for the meeting: 5 minutes    Lanier Clam, MD  05-Oct-2022, 4:05 PM

## 2022-09-29 NOTE — Progress Notes (Signed)
NAME:  Vincent [name], MRN:  [id number], DOB:  [birth year], LOS: 60 ADMISSION DATE:  09/18/2022, CONSULTATION DATE:  09/06/2022 REFERRING MD:  Marjory Lies - NIR CHIEF COMPLAINT:  S/p basilar artery occlusion, vent Management   History of Present Illness:  87 year old man who presented to [facility] ED 1/8 as a Code Stroke after being found down at home. Presented with R gaze preference, L-sided deficits and dysarthria. PMHx significant for HTN, HLD, AF (on Eliquis), CHF (EF 25-30%), CAD s/p stent (2004), prior CVA with residual L-sided deficits, COPD, T2DM.  CTA Head/Neck demonstrated distal basilar artery occlusion, left V4 occlusion. He was seen by neurology and IR and was taken to IR for revascularization of distal basilar artery; TICI 2C revascularization achieved. Post-procedure CT negative for hemorrhage.  Post-procedure, he was left intubated and was transferred to ICU.  PCCM was asked to see for vent management.  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 s/p remote stent to Cx 2004 with chronic stable angina in context of residual circumflex disease.   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)    Syncope and collapse    Unspecified glaucoma(365.9)    Significant Hospital Events: Including procedures,  antibiotic start and stop dates in addition to other pertinent events   1/9 Admit, to VIR for revascularization of occluded basilar artery. 1/11 agitated with vent wean 1/12 less interactive. CT head with no acute findings. Failed SBT with tachypnea. Given lasix with 2L out  1/13 blood culture positive for MSSA 1/15 Repeat CT head (09/12/2022 05:41): Stable post-cerebellar infarction petechial hemorrhage (Heidelberg classification 1c: PH1), most apparent in the right cerebellar hemisphere. No significant posterior fossa mass effect or other complicating features. Recent bilateral cerebellar and brainstem infarcts with chronic supratentorial ischemia and no new intracranial abnormality identified.  1/15 started on hypertonic saline 1/16 family meeting with sister  1/18 repeat head CT - Extensive recent infratentorial infarcts with petechial hemorrhage at the right lower cerebellum. The fourth ventricle remains patent. Large remote right MCA distribution infarct.  1/19 No acute issues overnight  Oct 07, 2022 Weaning well on 5/5, will attempt extubation   Interim History / Subjective:  Tolerating PS this am Low dose Precedex Getting K repletion this morning  Objective:  Blood pressure (!) 106/57, pulse 77, temperature 99.5 F (37.5 C), temperature source Axillary, resp. rate (!) 23, height '5\' 9"'$  (1.753 m), weight 66.9 kg, SpO2 97 %.    Vent Mode: PSV;CPAP FiO2 (%):  [30 %-40 %] 40 % Set Rate:  [15 bmp] 15 bmp Vt Set:  [560 mL] 560 mL PEEP:  [5 cmH20] 5 cmH20 Pressure Support:  [10 cmH20] 10 cmH20 Plateau Pressure:  [18 cmH20-20 cmH20] 20 cmH20   Intake/Output Summary (Last  24 hours) at 10-12-2022 1048 Last data filed at October 12, 2022 0900 Gross per 24 hour  Intake 1839.73 ml  Output 2550 ml  Net -710.27 ml   Filed Weights   09/17/22 0500 09/18/22 0500 2022-10-12 0500  Weight: 71.6 kg 65.8 kg 66.9 kg   Physical Exam: General: Critically ill-appearing, no acute distress,weaning HENT: , AT,  ETT in place, Feeding tube in place Eyes: EOMI, no scleral icterus Respiratory: Clear to auscultation bilaterally.  No crackles, wheezing or rales, weaning on 5/5 , in NAD Cardiovascular: RRR, -M/R/G, no JVD GI: BS+, soft, nontender, ND, BS + Extremities:-Edema,-tenderness Neuro: Eyes open, PERRL, follows commands, moves extremities x 4  Na 150, K 3.1, Cl 116, BUN 33/ Creatinine 1.16 WBC 8.1, HGB 9.1, PLT 231K Net + 140 cc's   Assessment & Plan:   Bilateral large cerebellar infarct with left pontine infarct in the setting of basilar artery occlusion now s/p IR intervention Acute encephalopathy -CT scan 1/15 shows petechial bleeding and cerebellar infarcts P: Neuro following  Maintain neuro protective measures; goal for eurothermia, euglycemia, eunatermia, normoxia, and PCO2 goal of 35-40 Nutrition and bowel regimen Aspiration precautions  Continue ASA   Respiratory failure -Intubated during IR revascularization and has remained intubated due to waxing and weaning mentation  -Size of his stroke is extensive and may not be able to protect his airway History of COPD P: See family discussion  12-Oct-2022. Discussed trach and PEG again today. Family struggling with decision. Remains full code Continue full vent support with lung protective strategies Head of bed elevated 30 degrees. Ensure adequate pulmonary hygiene  bronchodilators VAP bundle in place  PAD protocol, prn fentanyl and wean precedex for RASS -1  MSSA bacteremia -Cultures positive 1/13  P: Continue Cefazolin x4 weeks per ID   History of atrial fibrillation was on Eliquis P: IV heparin 1/19 resumed-   Heart failure with reduced ejection fraction -ECHO 1/15 with EF 20-25% with severally decreased LV function and global hypokinesis  Hypertension Hyperlipidemia P: Continuous telemetry  Continue ASA and statin Strict intake and output  Lasix prn  for goal net even/negative. Reassess daily Continue Spironolactone  and Entresto  Continue home coreg Home Imdur remain on hold   Diabetes P: Continue SSI increase to resistance scale  Increase TF coverage  Basal insulin  Hiccups P: Thorazine  Cephalic vein thrombosis P: IV heaprin 1/19, warm compress  Hypokalemia  P: Supplement as needed  Trend bmet   Ongoing goals of care discussion  1/18: Patient is more aware and interactive today. I did ask him about being on the ventilator long-term and he does not want to be on the ventilator long-term. He did indicate that if we get him off the ventilator, does not want to be back on the ventilator even if it means he is going to die-conversation was in the presence of his nurse and the respiratory therapist. This was confirmed by Dr. Leonie Man- when he evaluated the patient as well 1/19 Sister at bedside discussion plan of care with palliative care. Introduced myself and gave a brief update. Sister is still struggling with making goals of care decision. She was leaning toward less aggressive plan of care yesterday but today is questioning that decision. Will continue to support family as goals of care are being discussed. Per Dr. Clydene Fake note later in the day pt and [name] wish to pursue trach/peg, remain full code. 10-12-22 Spoke with sister at length. Patient meets criteria for extubation. Weaning on 5/5 and 30%, alert  and following commands. Will extubate today and see how he does. If he fails he will get re-tubed and then plan for trach and PEG this week with ongoing golas of care discussion. I do believe patient sister feels he would not want LTAC and poor quality of life. I think she is just having trouble with being the one to make the decision. We can perhaps ask the patient when he is extubated and off all sedation.   Best practice (evaluated daily):  Diet/type: TF DVT prophylaxis: SCD GI prophylaxis: PPI Lines: N/A Foley:  External foley in place  Code Status:  full code Last date of multidisciplinary  goals of care discussion: Per primary  Critical care:   The patient is critically ill with multiple organ systems failure and requires high complexity decision making for assessment and support, frequent evaluation and titration of therapies, application of advanced monitoring technologies and extensive interpretation of multiple databases.  Independent Critical Care Time: 68 Minutes.   Howard Pouch, MSN, [facility] 10/14/22 10:48 AM   Please see Amion for pager number to reach on-call Pulmonary and Critical Care Team.

## 2022-09-29 NOTE — IPAL (Signed)
  Interdisciplinary Goals of Care Family Meeting   Date carried out: 10/09/22  Location of the meeting: Bedside  Member's involved: Physician, Bedside Registered Nurse, and Family Member or next of kin  Durable Power of Attorney or acting medical decision maker: n/a - sister next of kin present    Discussion: We discussed goals of care for Vincent [name] .  Vincent [name] was extubated.  He is within his right mind.  He has capacity.  I asked if he wanted to go back on life support.  He shook his head vigorously no.  I said that if you do not go back on life support and your respiratory status were to worsen that he would die.  He shook his head yes that he understood that.  I asked several times and consistently he expressed he would not want to go on the life support, he would not want a tracheostomy.  Sister was present for this.  So was bedside RN.  I told him I was going to change his CODE STATUS to DNR.  Will try to make him well and continue to improve his condition.  But if he were to worsen would make him comfortable and pass naturally.  All parties expressed understanding.  Code status:   Code Status: DNR   Disposition: Continue current acute care  Time spent for the meeting: 10 minutes    Lanier Clam, MD  10/09/22, 12:07 PM

## 2022-09-29 NOTE — Progress Notes (Signed)
Brief Progress Note Extubated to [place] initially, then to [place] as sats were initially 88-90%, but patient had significant secretions. RT did deep oral suction with suction cath, and he coughed and cleared his secretions , sats are now 94-100% and patient RR is 31.  We will continue to monitor closely, and will re-intubate if needed . Family are aware that re-intubation would mean Trach/ PEG and LTAC if comfort care is not a decision they feel comfortable making/   Magdalen Spatz, MSN, AGACNP-BC [place] for personal pager PCCM on call pager [phone]  September 25, 2022 11:27 AM

## 2022-09-29 NOTE — Progress Notes (Addendum)
STROKE TEAM PROGRESS NOTE   INTERVAL HISTORY Patient is seen in his room with sister at the bedside.   His neurological exam is unchanged, and he is able to follow simple commands inconsistently. Possible tracheostomy today. Discussed plan of care with sister at bedside.   Vitals:   14-Oct-2022 1100 10-14-2022 1116 10/14/2022 1200 October 14, 2022 1300  BP: (!) 116/58  111/67 (!) 114/57  Pulse: 73 83 90 93  Resp: (!) 28 (!) 32 (!) 26 (!) 23  Temp:   99.1 F (37.3 C)   TempSrc:   Axillary   SpO2: 98% 93% 100% 100%  Weight:      Height:       CBC:  Recent Labs  Lab 09/14/22 0112 09/15/22 0311 09/17/22 0614 09/18/22 0501 14-Oct-2022 0435  WBC 8.9 9.8   < > 9.7 8.1  NEUTROABS 7.4 8.7*  --   --   --   HGB 10.2* 9.2*   < > 8.6* 9.1*  HCT 33.7* 30.3*   < > 30.3* 30.4*  MCV 88.7 89.4   < > 89.9 89.1  PLT 149* 198   < > 235 231   < > = values in this interval not displayed.    Basic Metabolic Panel:  Recent Labs  Lab 09/14/22 0112 09/14/22 0917 09/15/22 0311 09/15/22 0915 09/17/22 0614 09/18/22 0501 2022-10-14 0435  NA 153*   < > 156*   < > 156* 155* 150*  K 3.6  --  3.3*  --  3.3* 3.5 3.1*  CL 117*  --  121*  --  119* 116* 116*  CO2 26  --  32  --  '27 28 29  '$ GLUCOSE 285*  --  238*  --  168* 237* 140*  BUN 29*  --  28*  --  29* 32* 33*  CREATININE 1.03  --  1.09  --  1.05 1.17 1.16  CALCIUM 7.7*  --  8.0*  --  8.0* 8.0* 7.8*  MG 2.1  --  2.2  --   --   --   --   PHOS 1.9*  --  2.4*  --  2.7  --   --    < > = values in this interval not displayed.    Lipid Panel:  No results for input(s): "CHOL", "TRIG", "HDL", "CHOLHDL", "VLDL", "LDLCALC" in the last 168 hours.  HgbA1c:  No results for input(s): "HGBA1C" in the last 168 hours.  Urine Drug Screen: No results for input(s): "LABOPIA", "COCAINSCRNUR", "LABBENZ", "AMPHETMU", "THCU", "LABBARB" in the last 168 hours.  Alcohol Level No results for input(s): "ETH" in the last 168 hours.  IMAGING past 24 hours No results  found.  PHYSICAL EXAM  General - Well nourished, well developed elderly African-American male intubated on low-dose Precedex for sedation   Cardiovascular - irregularly irregular heart rate and rhythm.   Neuro - intubated on low-dose Precedex, drowsy but eyes open on voice, following simple commands.   Extraocular movements intact today, pupils equal round and reactive to light. Gag and cough present.   Initiating breaths on CPAP mode on the vent, slightly tachypneic. Facial symmetry not able to test due to ET tube.  Tongue protrusion not cooperative.  Generalized weakness, left overall weaker than right. Sensation, coordination not cooperative Gait not tested.  ASSESSMENT/PLAN Vincent [name] is a 87 y.o. male with history of  alcoholic cardiomyopathy, permanent atrial fibrillation on Eliquis, CHF with an EF 25 to 30% with global hypokinesis, CAD s/p  stenting, DM2, prior stroke, chronic bilateral lower extremity wounds with edema, LLE peroneal neuropathy with chronic foot drop, COPD, HLD and HTN who presented to the ED via EMS  after he was found down.  Patient remains intubated, and tracheostomy is planned for Monday.  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  CTA head & neck Occlusion of the left V4 segment and poor opacification of the basilar artery with apparent occlusion distally, age indeterminate. The bilateral PCAs are supplied by posterior communicating arteries with likely retrograde flow into the P1 segments.Multifocal high-grade stenosis of the right M2 and M3 branches   S/p IR with TICI2c reperfusion MRI - Multiple areas of acute infarct involving the cerebellum bilaterally, left pons, and right frontal white matter. Chronic infarcts in the cerebellum bilaterally with evidence of prior  hemorrhage. Large area of chronic infarct and mild chronic hemorrhage and in the right MCA territory.  MRA  - Basilar artery is patent now.   1/11- CT repeat - no hydrocephalus  1/12- CT repeat - no hydrocephalus CT Head 1/18-  Extensive recent infratentorial infarcts with petechial hemorrhage at the right lower cerebellum. The fourth ventricle remains patent. Large remote right MCA distribution infarct. 2D Echo EF 20-25%  LDL 56 HgbA1c 6.5 VTE prophylaxis - Heparin subq Eliquis (apixaban) daily prior to admission, now on heparin IV Therapy recommendations: Pending Disposition:  Pending, CCM discussed with pt sister and will do extubation to see if pt tolerates, if not, will need re-intubation.   Chronic Atrial Fibrillation  Home med: Eliquis Rate controlled On heparin IV  Hypernatremia, improving Hypertonic saline discontinued 1/18 Na 157-> 156-> 155 -->150 CCM managing On free water Likely related to Lasix  Congestive heart failure Alcoholic cardiomyopathy 01/3784 EF 25 to 30%.  This admission EF 20 to 25%. Home meds including Imdur, digoxin, Jardiance, lasix On Coreg, Lasix, Entresto, spironolactone CCM managing Pt follows with Dr. Aundra Dubin as outpt  Hypertension Home meds:  Lasix, coreg, aldactone Stable BP goal < 180/105 Long-term BP goal normotensive  Post operative respiratory insufficiency  Remains intubated post procedure CCM to manage ventilator settings Hx of COPD On Precedex Plan for extubation with re-intubation if needed  Hyperlipidemia Home meds:  Atorvastatin '80mg'$   LDL 56, goal < 70 AST/ALT 64/48-> 33/30 Resumed lipitor 80 Continue statin at discharge  Diabetes type II Controlled Home meds:  Jardiance, metformin HgbA1c 6.5, goal < 7.0 CBGs SSI  Dysphagia On tube feeding Plan for PEG this week  Left upper extremity thrombophlebitis Febrile-Tmax 103F ID consulted Blood cultures for MSSA  On cefazolin   Other Stroke Risk Factors Advanced  Age >/= 29  Hx stroke/TIA Evidence of chronic infarcts on imaging Coronary artery disease status post stenting patient  Other Active Problems Chronic BLE wound Chronic b/l foot drop Recent left hip fracture - walking with walker at home Leukopenia WBC 3.8->7.3->5.6 -> 3.8->5.4-> 9.6 -> resolved Hypokalemia K 3.2 -> 3.7->3.6-> 3.3 -> 3.5 ->3.1 replaced   Hospital Day #14  Patient seen and examined by NP/APP with MD. MD to update note as needed.   Vincent [name] , MSN, AGACNP-BC [facility] See Amion for schedule and pager information 2022/09/23 1:49 PM   ATTENDING NOTE: I reviewed above note and agree with the assessment and plan. Pt was seen and examined.  Sister at the bedside. CCM is discussing with sister about GOC. Pt still intubated on precedex. Sister has difficulty to make decision. CCM discussed with sister and plan to extubate with re-intubation if needed. Pt was then extubated at 24 and so far seems tolerating. Continue on heparin IV. Pending swallow evaluation. Per CCM, pt after extubation and refused re-intubation by himself.  For detailed assessment and plan, please refer to above/below as I have made changes wherever appropriate.   Vincent Hawking, MD PhD Stroke Neurology 09/25/2022 6:15 PM

## 2022-09-29 NOTE — Progress Notes (Signed)
TOD 2208 verified with Aileen Fass RN.

## 2022-09-29 NOTE — Procedures (Signed)
Extubation Procedure Note  Patient Details:   Name: Vincent [name] DOB: 1936/05/10 MRN: [id number]   Airway Documentation:    Vent end date: 10/01/22 Vent end time: 1110   Evaluation  O2 sats: stable throughout Complications: No apparent complications Patient did tolerate procedure well. Bilateral Breath Sounds: Clear, Diminished   Yes  Pt was extubates to HFNC on 10L. Pt was suctioned with a catheter to help with the removal of secretions. Pt. Is tolerating well at this time. Will continue to monitor.  Meral Geissinger Oct 01, 2022, 11:23 AM

## 2022-09-29 NOTE — Death Summary Note (Signed)
DEATH SUMMARY   Patient Details  Name: Vincent [name]. MRN: [id number] DOB: 1936/02/13  Admission/Discharge Information   Admit Date:  09-09-2022  Date of Death: Date of Death: September 23, 2022  Time of Death: Time of Death: 11-13-2206  Length of Stay: 2022-11-14  Referring Physician: [name], MD   Reason(s) for Hospitalization  stroke  Diagnoses  Preliminary cause of death:  Stroke: Bilateral large cerebellar infarct and left pontine infarct as well as right ACA punctate infarct with BA occlusion s/p IR with TICI 2c in the setting of chronic A-fib on eliquis   Secondary Diagnoses (including complications and co-morbidities):  Chronic afib HTN CHF Alcoholic cardiomyopathy Respiratory failure HLD DM Dysphagia  CAD s/p stenting Chronic BLE wound Chronic b/l foot drop Recent left hip fracture - walking with walker at home Leukopenia Hypokalemia Left upper extremity thrombophlebitis   Brief Hospital Course (including significant findings, care, treatment, and services provided and events leading to death)  Vincent [name]. is a 87 y.o. year old male with history of  alcoholic cardiomyopathy, permanent atrial fibrillation on Eliquis, CHF with an EF 25 to 30% with global hypokinesis, CAD s/p stenting, DM2, prior stroke, chronic bilateral lower extremity wounds with edema, LLE peroneal neuropathy with chronic foot drop, COPD, HLD and HTN who presented to the ED via EMS  after he was found down.  Patient remains intubated, and tracheostomy is planned for Monday.   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  CTA head & neck Occlusion of the left V4 segment and poor opacification of the basilar artery with apparent occlusion distally, age indeterminate. The bilateral PCAs are supplied by posterior  communicating arteries with likely retrograde flow into the P1 segments.Multifocal high-grade stenosis of the right M2 and M3 branches   S/p IR with TICI2c reperfusion MRI - Multiple areas of acute infarct involving the cerebellum bilaterally, left pons, and right frontal white matter. 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 Large area of chronic infarct and mild chronic hemorrhage and in the right MCA territory.  MRA  - Basilar artery is patent now.   1/11- CT repeat - no hydrocephalus  1/12- CT repeat - no hydrocephalus CT Head 1/18-  Extensive recent infratentorial infarcts with petechial hemorrhage at the right lower cerebellum. The fourth ventricle remains patent. Large remote right MCA distribution infarct. 2D Echo EF 20-25%  LDL 56 HgbA1c 6.5 Eliquis (apixaban) daily prior to admission, was on heparin IV during admission Disposition:  Pending, CCM discussed with pt sister and pt after extubation, pt declined re-intubation if respiratory decline, made DNR and then comfort care measures due to respiratory decline. Pt passed away [id number] on 2022/09/23   Chronic Atrial Fibrillation  Home med: Eliquis Rate controlled Was on heparin IV   Hypernatremia, improving Hypertonic saline discontinued 1/18 Na 157-> 156-> 155 -->150 CCM managing Was on free water Likely related to Lasix   Congestive heart failure Alcoholic cardiomyopathy 10/863 EF 25 to 30%.  This admission EF 20 to 25%. Home meds including Imdur, digoxin, Jardiance, lasix Was on Coreg, Lasix, Entresto, spironolactone Pt follows with Dr. Aundra Dubin as outpt   Hypertension Home meds:  Lasix, coreg, aldactone Stable BP goal < 180/105 during admission   Post operative respiratory insufficiency  Remains intubated post procedure  CCM to manage ventilator settings Hx of COPD Was on Precedex CCM discussed with pt sister and pt after extubation, pt declined re-intubation if respiratory decline,  made DNR and then comfort care measures due to respiratory decline.    Hyperlipidemia Home meds:  Atorvastatin '80mg'$   LDL 56, goal < 70 AST/ALT 64/48-> 33/30 Was on lipitor 80   Diabetes type II Controlled Home meds:  Jardiance, metformin HgbA1c 6.5, goal < 7.0 CBGs SSI   Dysphagia Was on tube feeding   Left upper extremity thrombophlebitis  Febrile resolved -Tmax 103F ID consulted Blood cultures for MSSA  Was treated with cefazolin    Other Stroke Risk Factors Advanced Age >/= 40  Hx stroke/TIA Evidence of chronic infarcts on imaging Coronary artery disease status post stenting patient   Other Active Problems Chronic BLE wound Chronic b/l foot drop Recent left hip fracture - walking with walker at home Leukopenia WBC 3.8->7.3->5.6 -> 3.8->5.4-> 9.6 -> resolved Hypokalemia K 3.2 -> 3.7->3.6-> 3.3 -> 3.5 ->3.1 -> replaced   Pertinent Labs and Studies  Significant Diagnostic Studies CT HEAD WO CONTRAST (5MM)  Result Date: 09/15/2022 CLINICAL DATA:  Stroke follow-up EXAM: CT HEAD WITHOUT CONTRAST TECHNIQUE: Contiguous axial images were obtained from the base of the skull through the vertex without intravenous contrast. RADIATION DOSE REDUCTION: This exam was performed according to the departmental dose-optimization program which includes automated exposure control, adjustment of the mA and/or kV according to patient size and/or use of iterative reconstruction technique. COMPARISON:  Three days ago FINDINGS: Brain: Extensive recent infarction in the right more than left cerebellum and in the pons with petechial hemorrhage in the inferior right cerebellum. No interval progression. Narrowing of the fourth ventricle but no hydrocephalus. Remote right MCA distribution infarct with most dense encephalomalacia at the posterior division. Generalized brain atrophy. No mass or collection. Vascular: No hyperdense vessel or unexpected calcification. Skull: Normal. Negative for fracture or  focal lesion. Sinuses/Orbits: No acute finding in the setting of intubation IMPRESSION: 1. Extensive recent infratentorial infarcts with petechial hemorrhage at the right lower cerebellum. The fourth ventricle remains patent. 2. Large remote right MCA distribution infarct. Electronically Signed   By: Jorje Guild M.D.   On: 09/15/2022 04:39   VAS [place] UPPER EXTREMITY VENOUS DUPLEX  Result Date: 09/12/2022 UPPER VENOUS STUDY  Patient Name:  Vincent [name]  Date of Exam:   09/12/2022 Medical Rec #: [id number]       Accession #:    [id number] Date of Birth: [birth year]        Patient Gender: M Patient Age:   28 years Exam Location:  [facility] Procedure:      VAS [place] UPPER EXTREMITY VENOUS DUPLEX Referring Phys: TRUNG VU --------------------------------------------------------------------------------  Indications: Left arm erythema, s/p IV, bacteremia of unclear source Anticoagulation: Eliquis. Comparison Study: No prior upper venous studies. Lower venous performed                   yesterday was within normal limits. Performing Technologist: Darlin Coco RDMS, RVT  Examination Guidelines: A complete evaluation includes B-mode imaging, spectral Doppler, color Doppler, and power Doppler as needed of all accessible portions of each vessel. Bilateral testing is considered an integral part of a complete examination. Limited examinations for reoccurring indications may be performed as noted.  Right Findings: +----------+------------+---------+-----------+----------+-------+ RIGHT     CompressiblePhasicitySpontaneousPropertiesSummary +----------+------------+---------+-----------+----------+-------+ Subclavian               Yes  Yes                      +----------+------------+---------+-----------+----------+-------+  Left Findings: +----------+------------+---------+-----------+----------+-------+ LEFT      CompressiblePhasicitySpontaneousPropertiesSummary  +----------+------------+---------+-----------+----------+-------+ IJV           Full       Yes       Yes                      +----------+------------+---------+-----------+----------+-------+ Subclavian               Yes       Yes                      +----------+------------+---------+-----------+----------+-------+ Axillary      Full                                          +----------+------------+---------+-----------+----------+-------+ Brachial      Full                                          +----------+------------+---------+-----------+----------+-------+ Radial        Full                                          +----------+------------+---------+-----------+----------+-------+ Ulnar         Full                                          +----------+------------+---------+-----------+----------+-------+ Cephalic      None       No        No                Acute  +----------+------------+---------+-----------+----------+-------+ Basilic       Full                                          +----------+------------+---------+-----------+----------+-------+  Summary:  Right: No evidence of thrombosis in the subclavian.  Left: No evidence of deep vein thrombosis in the upper extremity. Findings consistent with acute superficial vein thrombosis involving the left cephalic vein.  *See table(s) above for measurements and observations.  Diagnosing physician: Orlie Pollen Electronically signed by Orlie Pollen on 09/12/2022 at 6:44:49 PM.    Final    ECHOCARDIOGRAM LIMITED  Result Date: 09/12/2022    ECHOCARDIOGRAM LIMITED REPORT   Patient Name:   Vincent [name] Date of Exam: 09/12/2022 Medical Rec #:  [id number]      Height:       69.0 in Accession #:    [id number]     Weight:       166.9 lb Date of Birth:  July 16, 1936       BSA:          1.913 m Patient Age:    3 years       BP:           131/52 mmHg Patient Gender: M  HR:           59 bpm.  Exam Location:  Inpatient Procedure: Limited Echo, Cardiac Doppler and Color Doppler Indications:    bacteremia  History:        Patient has prior history of Echocardiogram examinations. CHF,                 CAD, COPD, Arrythmias:Atrial Fibrillation; Risk                 Factors:Hypertension and Dyslipidemia.  Sonographer:    Phineas Douglas Referring Phys: [phone] [place]  1. Left ventricular ejection fraction, by estimation, is 20 to 25%. The left ventricle has severely decreased function. The left ventricle demonstrates global hypokinesis. The left ventricular internal cavity size was severely dilated.  2. Right ventricular systolic function is normal. The right ventricular size is normal. There is moderately elevated pulmonary artery systolic pressure.  3. The mitral valve is abnormal. Mild mitral valve regurgitation. No evidence of mitral stenosis.  4. The aortic valve is tricuspid. There is moderate calcification of the aortic valve. There is moderate thickening of the aortic valve. Aortic valve regurgitation is not visualized. Aortic valve sclerosis is present, with no evidence of aortic valve stenosis.  5. The inferior vena cava is dilated in size with <50% respiratory variability, suggesting right atrial pressure of 15 mmHg. FINDINGS  Left Ventricle: Left ventricular ejection fraction, by estimation, is 20 to 25%. The left ventricle has severely decreased function. The left ventricle demonstrates global hypokinesis. The left ventricular internal cavity size was severely dilated. There is no left ventricular hypertrophy. Right Ventricle: The right ventricular size is normal. No increase in right ventricular wall thickness. Right ventricular systolic function is normal. There is moderately elevated pulmonary artery systolic pressure. The tricuspid regurgitant velocity is 2.90 m/s, and with an assumed right atrial pressure of 15 mmHg, the estimated right ventricular systolic pressure is 32.3  mmHg. Left Atrium: Left atrial size was normal in size. Right Atrium: Right atrial size was normal in size. Pericardium: There is no evidence of pericardial effusion. Mitral Valve: The mitral valve is abnormal. There is mild thickening of the mitral valve leaflet(s). Mild mitral valve regurgitation. No evidence of mitral valve stenosis. Tricuspid Valve: The tricuspid valve is normal in structure. Tricuspid valve regurgitation is mild . No evidence of tricuspid stenosis. Aortic Valve: The aortic valve is tricuspid. There is moderate calcification of the aortic valve. There is moderate thickening of the aortic valve. Aortic valve regurgitation is not visualized. Aortic valve sclerosis is present, with no evidence of aortic valve stenosis. Pulmonic Valve: The pulmonic valve was normal in structure. Pulmonic valve regurgitation is mild. No evidence of pulmonic stenosis. Aorta: The aortic root is normal in size and structure. Venous: The inferior vena cava is dilated in size with less than 50% respiratory variability, suggesting right atrial pressure of 15 mmHg. IAS/Shunts: No atrial level shunt detected by color flow Doppler. LEFT VENTRICLE PLAX 2D LVIDd:         6.60 cm LVIDs:         6.00 cm LV PW:         1.20 cm LV IVS:        0.80 cm LVOT diam:     1.90 cm LVOT Area:     2.84 cm  LV Volumes (MOD) LV vol d, MOD A2C: 228.0 ml LV vol d, MOD A4C: 231.0 ml LV vol s, MOD A2C: 163.0 ml LV vol  s, MOD A4C: 173.0 ml LV SV MOD A2C:     65.0 ml LV SV MOD A4C:     231.0 ml LV SV MOD BP:      56.1 ml IVC IVC diam: 2.60 cm                       PULMONIC VALVE AORTA                 PR End Diast Vel: 7.08 msec Ao Root diam: 3.20 cm  TRICUSPID VALVE TR Peak grad:   33.6 mmHg TR Vmax:        290.00 cm/s  SHUNTS Systemic Diam: 1.90 cm Jenkins Rouge MD Electronically signed by Jenkins Rouge MD Signature Date/Time: 09/12/2022/10:59:26 AM    Final    [place] LT UPPER EXTREM LTD SOFT TISSUE NON VASCULAR  Result Date: 09/12/2022 CLINICAL  DATA:  Swelling in the left antecubital fossa. EXAM: ULTRASOUND left UPPER EXTREMITY LIMITED TECHNIQUE: Ultrasound examination of the upper extremity soft tissues was performed in the area of clinical concern. COMPARISON:  None FINDINGS: Fluid/inflammation/edema in the subcutaneous tissues. No discrete fluid collection to suggest an abscess. Associated thrombophlebitis involving the basilic vein. IMPRESSION: 1. Fluid/inflammation/edema in the subcutaneous tissues. No discrete fluid collection to suggest an abscess. 2. Thrombophlebitis of the basilic vein. Electronically Signed   By: Marijo Sanes M.D.   On: 09/12/2022 06:25   CT HEAD WO CONTRAST (5MM)  Result Date: 09/12/2022 CLINICAL DATA:  87 year old male status post thrombectomy of the basilar artery with cerebellar and brainstem infarcts. Hemorrhagic transformation of right cerebellar infarct. Subsequent encounter. EXAM: CT HEAD WITHOUT CONTRAST TECHNIQUE: Contiguous axial images were obtained from the base of the skull through the vertex without intravenous contrast. RADIATION DOSE REDUCTION: This exam was performed according to the departmental dose-optimization program which includes automated exposure control, adjustment of the mA and/or kV according to patient size and/or use of iterative reconstruction technique. COMPARISON:  Head CTs 09/11/2022 and earlier.  Brain MRI 09/06/2022. FINDINGS: Brain: Chronic supratentorial encephalomalacia, extensive on the right. No supratentorial mass effect or midline shift. Heterogeneous bilateral cerebellar and brainstem infarcts. Hemorrhagic transformation within the larger right cerebellar infarct is in the PICA territory and most resembles confluent petechial blood, series 3, image 7 - Heidelberg classification 1c: PH1, hematoma within infarcted tissue, occupying <30%, no substantive mass effect. This does not appear significantly changed since yesterday. Since 09/09/2012, mass effect on the 4th ventricle appears  reduced and basilar cisterns remain normally patent. No ventriculomegaly. No new areas of infarction or bleeding are evident. Vascular: Extensive Calcified atherosclerosis at the skull base. Skull: Stable visualized osseous structures. Sinuses/Orbits: Right nasoenteric tube remains in place. Stable sinus, middle ear and mastoid aeration. Other: Stable orbit and scalp soft tissues. Some scalp vessel calcified atherosclerosis. IMPRESSION: 1. Stable post-cerebellar infarction petechial hemorrhage (Heidelberg classification 1c: PH1), most apparent in the right cerebellar hemisphere. No significant posterior fossa mass effect or other complicating features. 2. Recent bilateral cerebellar and brainstem infarcts with chronic supratentorial ischemia and no new intracranial abnormality identified. Electronically Signed   By: Genevie Ann M.D.   On: 09/12/2022 05:41   CT HEAD WO CONTRAST (5MM)  Result Date: 09/12/2022 CLINICAL DATA:  Follow-up examination for neuro deficit, stroke suspected. EXAM: CT HEAD WITHOUT CONTRAST TECHNIQUE: Contiguous axial images were obtained from the base of the skull through the vertex without intravenous contrast. RADIATION DOSE REDUCTION: This exam was performed according to the departmental dose-optimization program which  includes automated exposure control, adjustment of the mA and/or kV according to patient size and/or use of iterative reconstruction technique. COMPARISON:  Prior CT from 09/09/2022 as well as earlier studies. FINDINGS: Brain: There has been continued interval evolution of previously identified right larger than left cerebellar strokes. Minimal patchy involvement of the brainstem, better seen on prior MRI. Overall size and distribution is relatively stable. However, there has been interval hemorrhagic transformation at the right cerebellum, with a superimposed parenchymal bleed measuring approximately 1.6 x 1.6 x 1.3 cm now seen (series 5, image 8). Mass effect posterior fossa  appears relatively similar. Fourth ventricle remains patent. Basilar cisterns remain patent. No hydrocephalus. No other new intracranial hemorrhage or large vessel territory infarct. Large remote right MCA distribution infarct again noted. Chronic left pontine lacunar infarct noted as well. No mass lesion or midline shift. No extra-axial collection. Vascular: No abnormal hyperdense vessel. Calcified atherosclerosis present at the skull base. Skull: Scalp soft tissues and calvarium demonstrate no new finding. Sinuses/Orbits: Globes orbital soft tissues within normal limits. Scattered mucosal thickening throughout the ethmoidal air cells. Nasogastric to mucosal thickening within the sphenoid ethmoidal sinuses. Nasogastric tube in place. No significant mastoid effusion. Other: None. IMPRESSION: 1. Continued interval evolution of previously identified right larger than left cerebellar strokes. However, there has been interval hemorrhagic transformation at the right cerebellum, with a superimposed 1.6 x 1.6 x 1.3 cm parenchymal bleed now seen. Mass effect posterior fossa appears relatively similar. No hydrocephalus. 2. No other new acute intracranial abnormality. 3. Large remote right MCA distribution infarct, stable. These results were communicated to Dr. Cheral Marker at 2:14 am on 09/12/2022 by text page via the [facility] messaging system. Electronically Signed   By: Jeannine Boga M.D.   On: 09/12/2022 02:16   CT HIP LEFT W CONTRAST  Result Date: 09/11/2022 CLINICAL DATA:  Left hip pain. Known left hip fractures. Infection suspected EXAM: CT OF THE LOWER LEFT EXTREMITY WITH CONTRAST TECHNIQUE: Multidetector CT imaging of the lower left extremity was performed according to the standard protocol following intravenous contrast administration. RADIATION DOSE REDUCTION: This exam was performed according to the departmental dose-optimization program which includes automated exposure control, adjustment of the mA and/or kV  according to patient size and/or use of iterative reconstruction technique. CONTRAST:  46m OMNIPAQUE IOHEXOL 350 MG/ML SOLN COMPARISON:  Hip CT 07/21/2022 FINDINGS: Bones/Joint/Cartilage Redemonstration of healing subacute nondisplaced fractures of the left inferior pubic ramus, left pubic root, and left acetabulum with patchy sclerosis along the fracture margins. No change in alignment. No new fractures. Hip joint alignment is maintained without dislocation. Moderate osteoarthritis of the left hip. No discernible left hip joint effusion. No lytic or sclerotic bone lesion. Ligaments Suboptimally assessed by CT. Muscles and Tendons No acute musculotendinous abnormality by CT. Soft tissues Extensive soft tissue edema and ill-defined fluid of the left hip and proximal left thigh, nonspecific. No organized or rim enhancing fluid collections. No soft tissue gas. Large right and small left hydroceles. No left inguinal lymphadenopathy. Prominent vascular calcification. IMPRESSION: 1. Redemonstration of healing subacute nondisplaced fractures of the left inferior pubic ramus, left pubic root, and left acetabulum. No change in alignment. No new fractures. 2. Extensive soft tissue edema and ill-defined fluid of the left hip and proximal left thigh, nonspecific, but may represent cellulitis in the appropriate clinical setting. No organized or rim enhancing fluid collections. No soft tissue gas. 3. No left hip joint effusion is seen. 4. Large right and small left hydroceles. Electronically Signed  By: Davina Poke D.O.   On: 09/11/2022 16:55   VAS [place] LOWER EXTREMITY VENOUS (DVT)  Result Date: 09/11/2022  Lower Venous DVT Study Patient Name:  Vincent [name]  Date of Exam:   09/11/2022 Medical Rec #: [id number]       Accession #:    [id number] Date of Birth: 05/17/1936        Patient Gender: M Patient Age:   67 years Exam Location:  [facility] Procedure:      VAS [place] LOWER EXTREMITY VENOUS (DVT) Referring Phys:  Hayden Pedro --------------------------------------------------------------------------------  Indications: Fever.  Anticoagulation: IV HEPARIN & prior to admission Eliquis. Limitations: Poor ultrasound/tissue interface. Comparison Study: previous exam 02/21/21 was negative for DVT Performing Technologist: Rogelia Rohrer RVT, RDMS  Examination Guidelines: A complete evaluation includes B-mode imaging, spectral Doppler, color Doppler, and power Doppler as needed of all accessible portions of each vessel. Bilateral testing is considered an integral part of a complete examination. Limited examinations for reoccurring indications may be performed as noted. The reflux portion of the exam is performed with the patient in reverse Trendelenburg.  +---------+---------------+---------+-----------+----------+--------------+ RIGHT    CompressibilityPhasicitySpontaneityPropertiesThrombus Aging +---------+---------------+---------+-----------+----------+--------------+ CFV      Full           Yes      Yes                                 +---------+---------------+---------+-----------+----------+--------------+ SFJ      Full                                                        +---------+---------------+---------+-----------+----------+--------------+ FV Prox  Full           Yes      Yes                                 +---------+---------------+---------+-----------+----------+--------------+ FV Mid   Full           Yes      Yes                                 +---------+---------------+---------+-----------+----------+--------------+ FV DistalFull           Yes      Yes                                 +---------+---------------+---------+-----------+----------+--------------+ PFV      Full                                                        +---------+---------------+---------+-----------+----------+--------------+ POP      Full           Yes      Yes                                  +---------+---------------+---------+-----------+----------+--------------+  PTV      Full                                                        +---------+---------------+---------+-----------+----------+--------------+ PERO     Full                                                        +---------+---------------+---------+-----------+----------+--------------+ Difficulty compressing area of CFV / SFJ due to bandage placement from IR procedure - patent by color/doppler. Difficulty visualizing calf veins due to calcific shadowing.  +---------+---------------+---------+-----------+----------+--------------+ LEFT     CompressibilityPhasicitySpontaneityPropertiesThrombus Aging +---------+---------------+---------+-----------+----------+--------------+ CFV      Full           Yes      Yes                                 +---------+---------------+---------+-----------+----------+--------------+ SFJ      Full                                                        +---------+---------------+---------+-----------+----------+--------------+ FV Prox  Full           Yes      Yes                                 +---------+---------------+---------+-----------+----------+--------------+ FV Mid   Full           Yes      Yes                                 +---------+---------------+---------+-----------+----------+--------------+ FV DistalFull           Yes      Yes                                 +---------+---------------+---------+-----------+----------+--------------+ PFV      Full                                                        +---------+---------------+---------+-----------+----------+--------------+ POP      Full           Yes      Yes                                 +---------+---------------+---------+-----------+----------+--------------+ PTV      Full                                                         +---------+---------------+---------+-----------+----------+--------------+  PERO     Full                                                        +---------+---------------+---------+-----------+----------+--------------+ Difficulty visualizing calf veins due to calcific shadowing.    Summary: BILATERAL: - No evidence of deep vein thrombosis seen in the lower extremities, bilaterally. -No evidence of popliteal cyst, bilaterally.   *See table(s) above for measurements and observations. Electronically signed by Monica Martinez MD on 09/11/2022 at 1:51:11 PM.    Final    DG CHEST PORT 1 VIEW  Result Date: 09/10/2022 CLINICAL DATA:  Ventilator dependent EXAM: PORTABLE CHEST 1 VIEW COMPARISON:  Prior chest x-ray 08/29/2022 FINDINGS: Patient remains intubated. The tip of the endotracheal tube is 7.7 cm above the carina. A feeding tube is present. The tip lies off the field of view, presumably in the stomach. Significant enlargement of the cardiopericardial silhouette as seen before. Improved aeration. Lungs are clear. Perhaps trace small pleural effusions. No pulmonary edema. No pneumothorax. No acute osseous abnormality. IMPRESSION: 1. Overall improved aeration and inspiratory volumes. 2. Stable and satisfactory support apparatus. 3. Persistent cardiomegaly. 4. Probable trace bilateral pleural effusions. Electronically Signed   By: Jacqulynn Cadet M.D.   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 Cerebellar and brainstem infarcts. Subsequent encounter. EXAM: CT HEAD WITHOUT CONTRAST TECHNIQUE: Contiguous axial images were obtained from the base of the skull through the vertex without intravenous contrast. RADIATION DOSE REDUCTION: This exam was performed according to the departmental dose-optimization program which includes automated exposure control, adjustment of the mA and/or kV  according to patient size and/or use of iterative reconstruction technique. COMPARISON:  Brain MRI 09/06/2022 and head CT 09/08/2022. FINDINGS: Brain: Chronic encephalomalacia right hemisphere. Stable supratentorial gray-white differentiation. No supratentorial hemorrhage or mass effect. Cytotoxic edema in the bilateral cerebellum and brainstem. No malignant hemorrhagic transformation. Fourth ventricle and basilar cisterns remain patent. Stable gray-white matter differentiation throughout the brain. Vascular: Calcified atherosclerosis at the skull base. Skull: No acute osseous abnormality identified. Sinuses/Orbits: Right nasoenteric tube remains in place. Visualized paranasal sinuses and mastoids are stable and well aerated. Other: No acute orbit or scalp soft tissue finding. Intubated, partially visible endotracheal tube. IMPRESSION: 1. Stable non contrast CT appearance of brainstem and cerebellar infarcts. No malignant hemorrhagic transformation or significant posterior fossa mass effect. 2. No new intracranial abnormality. Chronic right hemisphere encephalomalacia. Electronically Signed   By: Genevie Ann M.D.   On: 09/09/2022 11:06   CT HEAD WO CONTRAST (5MM)  Result Date: 09/08/2022 CLINICAL DATA:  Stroke follow-up EXAM: CT HEAD WITHOUT CONTRAST TECHNIQUE: Contiguous axial images were obtained from the base of the skull through the vertex without intravenous contrast. RADIATION DOSE REDUCTION: This exam was performed according to the departmental dose-optimization program which includes automated exposure control, adjustment of the mA and/or kV according to patient size and/or use of iterative reconstruction technique. COMPARISON:  MRI Brain 09/06/22 FINDINGS: Brain: Redemonstrated is a large chronic right MCA territory infarct. Redemonstrated evolving bilateral cerebellar infarcts. There is increased conspicuity of the pontine infarct seen on prior brain MRI. Ex vacuo dilatation of the right lateral ventricular  system. No hydrocephalus. No extra-axial fluid collection. No hemorrhage. Vascular: No hyperdense vessel or  unexpected calcification. Skull: Normal. Negative for fracture or focal lesion. Sinuses/Orbits: Bilateral lens replacement. Mild mucosal thickening in the bilateral ethmoid and left sphenoid sinus. No middle ear or mastoid effusion. Partially visualized enteric tube place. Other: None IMPRESSION: 1. Redemonstrated evolving bilateral cerebellar infarcts with increased conspicuity of the pontine infarct seen on prior brain MRI. 2. Redemonstrated large chronic right MCA territory infarct. 3. No hemorrhage or CT evidence of a new infarct. Electronically Signed   By: Marin Roberts M.D.   On: 09/08/2022 11:18   DG CHEST PORT 1 VIEW  Result Date: 09/08/2022 CLINICAL DATA:  Hemoptysis EXAM: PORTABLE CHEST 1 VIEW COMPARISON:  09/07/2022 FINDINGS: Endotracheal tube terminates at the level of the clavicular heads. Enteric tube courses below the diaphragm with distal tip beyond the inferior margin of the film. Stable cardiomegaly. Mild pulmonary vascular congestion. Similar mild perihilar and bibasilar interstitial prominence without new airspace consolidation. No pleural effusion or pneumothorax. IMPRESSION: Stable radiographic appearance of the chest. Cardiomegaly with mild pulmonary vascular congestion. Electronically Signed   By: Davina Poke D.O.   On: 09/08/2022 11:13   DG Abd Portable 1V  Result Date: 09/07/2022 CLINICAL DATA:  Tube placement EXAM: PORTABLE ABDOMEN - 1 VIEW COMPARISON:  09/06/2022 FINDINGS: Limited portable view of the upper abdomen demonstrates Dobbhoff tube with tip overlying the stomach. This could be advanced further. There is some gas seen in nondilated loops of bowel elsewhere in the abdomen. Preserved multiple stones in the gallbladder in the right upper quadrant. IMPRESSION: Limited x-ray for tube placement demonstrates Dobbhoff tube overlying the midbody of the stomach  Electronically Signed   By: Jill Side M.D.   On: 09/07/2022 10:10   IR PERCUTANEOUS ART THROMBECTOMY/INFUSION INTRACRANIAL INC DIAG ANGIO  Result Date: 09/07/2022 INDICATION: Severe dysarthria, right-sided nystagmus, bilateral upper and lower extremity weakness and progressive decrease in level of consciousness. 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 EXAM: 1. EMERGENT LARGE VESSEL OCCLUSION THROMBOLYSIS (POSTERIOR CIRCULATION) COMPARISON:  CTA of the head and neck September 05, 2022. MEDICATIONS: Ancef 2 g IV antibiotic was administered within 1 hour of the procedure. ANESTHESIA/SEDATION: General anesthesia. CONTRAST:  Omnipaque 300 approximately 140 cc. FLUOROSCOPY TIME:  Fluoroscopy Time: 135 minutes 6 seconds (2702 mGy). COMPLICATIONS: None immediate. TECHNIQUE: Following a full explanation of the procedure along with the potential associated complications, an informed witnessed consent was obtained from the patient's sister. The risks of intracranial hemorrhage of 10%, worsening neurological deficit, ventilator dependency, death and inability to revascularize were all reviewed in detail with the patient's sister. The patient was then put under general anesthesia by the Department of Anesthesiology at [facility]. The right groin was prepped and draped in the usual sterile fashion. Thereafter using modified Seldinger technique, transfemoral access into the right common femoral artery was obtained without difficulty. Over a 0.035 inch guidewire an 8 French 25 cm Pinnacle sheath was inserted. Through this, and also over a 0.035 inch guidewire a 5 [place] JB 1 catheter was advanced to the aortic arch region and selectively positioned in the innominate artery, the left subclavian artery and the right common carotid artery. FINDINGS: The left subclavian artery demonstrates a hypoplastic non dominant left vertebral artery with a patent origin. Vessel is seen to opacify to the cranial  skull base where it terminates just distal to the left posterior-inferior cerebellar artery. The innominate arteriogram demonstrates patency of the right subclavian artery and the right common carotid artery proximally. The right vertebral artery at its  origin demonstrates approximately 30% stenosis associated with an irregular atherosclerotic plaque. The vessel is, otherwise, seen to opacify to the cranial skull base. Opacification is noted into the right vertebrobasilar junction and the right posterior-inferior cerebellar artery. The right common carotid arteriogram demonstrates the right external carotid artery and its major branches to be widely patent. The right internal carotid artery at the bulb to the cranial skull base is also widely patent. PROCEDURE: The 5 [place] JB 1 catheter was advanced distally into the right subclavian artery over an 035 inch guidewire. The guidewire was removed. Good aspiration obtained from the hub of the 5 French diagnostic catheter. A gentle control arteriogram performed through this demonstrated no evidence of spasms, dissections or of intraluminal filling defects. Again demonstrated was the stenotic origin of the right vertebral artery with moderate proximal tortuosity. The vessel, more distally, demonstrates moderate to moderately severe tortuosity to the right vertebrobasilar junction with slow stagnant flow noted distal to this. Over an 035 inch 300 cm Rosen exchange guidewire, an 8 French 90 cm Neuron Max guide catheter was advanced and positioned just proximal to the origin of the right vertebral artery. Over an 035 inch guidewire, a 132 cm 6 [place] Catalyst guide catheter was then advanced just proximal to the origin of the right vertebral artery. The glidewire was then advanced to the mid right vertebral artery. Subsequent advancement of the 6 [place] Catalyst guide catheter, and the Neuron Max sheath was met herniation into the aortic arch. Attempts were then made using  027 microcatheter with the 018 micro guidewire which resulted in instability of the platform resulting in herniation of the combination into the aortic arch. This approach was abandoned. The right radial artery was then identified with ultrasound, and its morphology documented and radial PACS system. A dorsal palmar anastomosis was verified present. Over an 018 inch micro guidewire, a 6/7 French radial sheath was then advanced without difficulty. The obturator, and the micro guidewire were removed. Good aspiration was obtained from the hub of the radial sheath. 200 mcg of nitroglycerin, and 2.5 mg of verapamil and 1000 units of heparin was then injected through the radial sheath without event. A right radial arteriogram was then performed. Over an 035 inch guidewire, a 5 French Simmons 2 diagnostic catheter was advanced into the proximal 1/3 of the right vertebral artery. The guidewire was removed. Good aspiration obtained from the hub of the diagnostic catheter. A gentle control arteriogram performed through this demonstrated moderately extensive tortuosity of the right vertebral artery to the cranial skull base. Also noted at this time was patency of the right posterior-inferior cerebellar artery with slow flow noted into the distal 1/3 of the basilar artery which remained occluded. Patency was noted of the anterior-inferior cerebellar arteries. Over an 035 inch 280 cm Terumo exchange guidewire, a 5 cm 072 Benchmark catheter was advanced and positioned at the junction of the middle and the proximal 1/3 of the right vertebral artery. The guidewire was removed. Good aspiration obtained from the hub of the Benchmark guide catheter. A gentle control arteriogram performed through the Benchmark guide catheter demonstrated no evidence of spasms, dissections or of intraluminal filling defects. A combination of an 021 160 cm microcatheter inside of a 120 cm 45 Phenom plus catheter was advanced over an 014 inch support  micro guidewire with a moderate J configuration, the combination was advanced without difficulty to the proximal basilar artery. The micro guidewire was then gently advanced into the left posterior cerebral artery proximal petrous  region followed by the microcatheter. The guidewire was removed. Good aspiration obtained from the hub of the microcatheter. A gentle control arteriogram performed through the microcatheter demonstrated safe positioning of the tip of the microcatheter which was then connected to continuous heparinized saline infusion. The Benchmark microcatheter was advanced as distally as possible to the right vertebrobasilar junction to the right posterior-inferior cerebellar artery. A 4 mm x 40 mm Solitaire X retrieval device was advanced and positioned in the P1 P2 junction more proximally into the basilar artery. Aspiration was applied at the hub of the Benchmark guide catheter for approximately 2-1/2 minutes. Thereafter, the retrieval device, the microcatheter and the Phenom Plus catheter were retrieved and removed. A control arteriogram performed through the Benchmark guide catheter in the distal right vertebral artery demonstrated complete revascularization of the basilar artery and of the left posterior cerebral artery and the left superior cerebellar artery. No supraclinoid opacification noted of the right P1 segment due to inflow from the anterior circulation. The right superior cerebellar artery remained occluded. A TICI 2C revascularization was achieved. The Benchmark guide catheter was then retrieved more proximally into the right subclavian artery. A gentle control arteriogram performed at this site demonstrated patency of the right vertebral artery extra cranially and intracranially. The 072 guide catheter was removed. A wrist band was then applied at the hub of the radial puncture site. Distal right radial pulse was verified to be present. A flat panel CT of the brain demonstrated no  evidence of intracranial hemorrhage. No mass effect was noted either. Patient was left intubated due to his medical condition. He was then transferred to the neuro ICU for post revascularization care. IMPRESSION: Status post endovascular complete revascularization of the occluded basilar artery, the left posterior cerebral artery and the left superior cerebellar artery and partially the right superior cerebellar artery with 1 pass with a 4 mm x 40 mm Solitaire X retrieval device and proximal aspiration. PLAN: Follow-up as per referring MD. Electronically Signed   By: Luanne Bras M.D.   On: 09/07/2022 08:31   IR CT Head Ltd  Result Date: 09/07/2022 INDICATION: Severe dysarthria, right-sided nystagmus, bilateral upper and lower extremity weakness and progressive decrease in level of consciousness. 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 EXAM: 1. EMERGENT LARGE VESSEL OCCLUSION THROMBOLYSIS (POSTERIOR CIRCULATION) COMPARISON:  CTA of the head and neck September 05, 2022. MEDICATIONS: Ancef 2 g IV antibiotic was administered within 1 hour of the procedure. ANESTHESIA/SEDATION: General anesthesia. CONTRAST:  Omnipaque 300 approximately 140 cc. FLUOROSCOPY TIME:  Fluoroscopy Time: 135 minutes 6 seconds (2702 mGy). COMPLICATIONS: None immediate. TECHNIQUE: Following a full explanation of the procedure along with the potential associated complications, an informed witnessed consent was obtained from the patient's sister. The risks of intracranial hemorrhage of 10%, worsening neurological deficit, ventilator dependency, death and inability to revascularize were all reviewed in detail with the patient's sister. The patient was then put under general anesthesia by the Department of Anesthesiology at [facility]. The right groin was prepped and draped in the usual sterile fashion. Thereafter using modified Seldinger technique, transfemoral access into the right common femoral artery was  obtained without difficulty. Over a 0.035 inch guidewire an 8 French 25 cm Pinnacle sheath was inserted. Through this, and also over a 0.035 inch guidewire a 5 [place] JB 1 catheter was advanced to the aortic arch region and selectively positioned in the innominate artery, the left subclavian artery and the right common carotid artery. FINDINGS:  The left subclavian artery demonstrates a hypoplastic non dominant left vertebral artery with a patent origin. Vessel is seen to opacify to the cranial skull base where it terminates just distal to the left posterior-inferior cerebellar artery. The innominate arteriogram demonstrates patency of the right subclavian artery and the right common carotid artery proximally. The right vertebral artery at its origin demonstrates approximately 30% stenosis associated with an irregular atherosclerotic plaque. The vessel is, otherwise, seen to opacify to the cranial skull base. Opacification is noted into the right vertebrobasilar junction and the right posterior-inferior cerebellar artery. The right common carotid arteriogram demonstrates the right external carotid artery and its major branches to be widely patent. The right internal carotid artery at the bulb to the cranial skull base is also widely patent. PROCEDURE: The 5 [place] JB 1 catheter was advanced distally into the right subclavian artery over an 035 inch guidewire. The guidewire was removed. Good aspiration obtained from the hub of the 5 French diagnostic catheter. A gentle control arteriogram performed through this demonstrated no evidence of spasms, dissections or of intraluminal filling defects. Again demonstrated was the stenotic origin of the right vertebral artery with moderate proximal tortuosity. The vessel, more distally, demonstrates moderate to moderately severe tortuosity to the right vertebrobasilar junction with slow stagnant flow noted distal to this. Over an 035 inch 300 cm Rosen exchange guidewire, an 8  French 90 cm Neuron Max guide catheter was advanced and positioned just proximal to the origin of the right vertebral artery. Over an 035 inch guidewire, a 132 cm 6 [place] Catalyst guide catheter was then advanced just proximal to the origin of the right vertebral artery. The glidewire was then advanced to the mid right vertebral artery. Subsequent advancement of the 6 [place] Catalyst guide catheter, and the Neuron Max sheath was met herniation into the aortic arch. Attempts were then made using 027 microcatheter with the 018 micro guidewire which resulted in instability of the platform resulting in herniation of the combination into the aortic arch. This approach was abandoned. The right radial artery was then identified with ultrasound, and its morphology documented and radial PACS system. A dorsal palmar anastomosis was verified present. Over an 018 inch micro guidewire, a 6/7 French radial sheath was then advanced without difficulty. The obturator, and the micro guidewire were removed. Good aspiration was obtained from the hub of the radial sheath. 200 mcg of nitroglycerin, and 2.5 mg of verapamil and 1000 units of heparin was then injected through the radial sheath without event. A right radial arteriogram was then performed. Over an 035 inch guidewire, a 5 French Simmons 2 diagnostic catheter was advanced into the proximal 1/3 of the right vertebral artery. The guidewire was removed. Good aspiration obtained from the hub of the diagnostic catheter. A gentle control arteriogram performed through this demonstrated moderately extensive tortuosity of the right vertebral artery to the cranial skull base. Also noted at this time was patency of the right posterior-inferior cerebellar artery with slow flow noted into the distal 1/3 of the basilar artery which remained occluded. Patency was noted of the anterior-inferior cerebellar arteries. Over an 035 inch 280 cm Terumo exchange guidewire, a 5 cm 072 Benchmark  catheter was advanced and positioned at the junction of the middle and the proximal 1/3 of the right vertebral artery. The guidewire was removed. Good aspiration obtained from the hub of the Benchmark guide catheter. A gentle control arteriogram performed through the Benchmark guide catheter demonstrated no evidence of spasms, dissections or of  intraluminal filling defects. A combination of an 021 160 cm microcatheter inside of a 120 cm 45 Phenom plus catheter was advanced over an 014 inch support micro guidewire with a moderate J configuration, the combination was advanced without difficulty to the proximal basilar artery. The micro guidewire was then gently advanced into the left posterior cerebral artery proximal petrous region followed by the microcatheter. The guidewire was removed. Good aspiration obtained from the hub of the microcatheter. A gentle control arteriogram performed through the microcatheter demonstrated safe positioning of the tip of the microcatheter which was then connected to continuous heparinized saline infusion. The Benchmark microcatheter was advanced as distally as possible to the right vertebrobasilar junction to the right posterior-inferior cerebellar artery. A 4 mm x 40 mm Solitaire X retrieval device was advanced and positioned in the P1 P2 junction more proximally into the basilar artery. Aspiration was applied at the hub of the Benchmark guide catheter for approximately 2-1/2 minutes. Thereafter, the retrieval device, the microcatheter and the Phenom Plus catheter were retrieved and removed. A control arteriogram performed through the Benchmark guide catheter in the distal right vertebral artery demonstrated complete revascularization of the basilar artery and of the left posterior cerebral artery and the left superior cerebellar artery. No supraclinoid opacification noted of the right P1 segment due to inflow from the anterior circulation. The right superior cerebellar artery  remained occluded. A TICI 2C revascularization was achieved. The Benchmark guide catheter was then retrieved more proximally into the right subclavian artery. A gentle control arteriogram performed at this site demonstrated patency of the right vertebral artery extra cranially and intracranially. The 072 guide catheter was removed. A wrist band was then applied at the hub of the radial puncture site. Distal right radial pulse was verified to be present. A flat panel CT of the brain demonstrated no evidence of intracranial hemorrhage. No mass effect was noted either. Patient was left intubated due to his medical condition. He was then transferred to the neuro ICU for post revascularization care. IMPRESSION: Status post endovascular complete revascularization of the occluded basilar artery, the left posterior cerebral artery and the left superior cerebellar artery and partially the right superior cerebellar artery with 1 pass with a 4 mm x 40 mm Solitaire X retrieval device and proximal aspiration. PLAN: Follow-up as per referring MD. Electronically Signed   By: Luanne Bras M.D.   On: 09/07/2022 08:31   IR US Guide Vasc Access Right  Result Date: 09/07/2022 INDICATION: Severe dysarthria, right-sided nystagmus, bilateral upper and lower extremity weakness and progressive decrease in level of consciousness. 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 EXAM: 1. EMERGENT LARGE VESSEL OCCLUSION THROMBOLYSIS (POSTERIOR CIRCULATION) COMPARISON:  CTA of the head and neck September 05, 2022. MEDICATIONS: Ancef 2 g IV antibiotic was administered within 1 hour of the procedure. ANESTHESIA/SEDATION: General anesthesia. CONTRAST:  Omnipaque 300 approximately 140 cc. FLUOROSCOPY TIME:  Fluoroscopy Time: 135 minutes 6 seconds (2702 mGy). COMPLICATIONS: None immediate. TECHNIQUE: Following a full explanation of the procedure along with the potential associated complications, an informed witnessed consent was  obtained from the patient's sister. The risks of intracranial hemorrhage of 10%, worsening neurological deficit, ventilator dependency, death and inability to revascularize were all reviewed in detail with the patient's sister. The patient was then put under general anesthesia by the Department of Anesthesiology at [facility]. The right groin was prepped and draped in the usual sterile fashion. Thereafter using modified Seldinger technique, transfemoral access into the  right common femoral artery was obtained without difficulty. Over a 0.035 inch guidewire an 8 French 25 cm Pinnacle sheath was inserted. Through this, and also over a 0.035 inch guidewire a 5 [place] JB 1 catheter was advanced to the aortic arch region and selectively positioned in the innominate artery, the left subclavian artery and the right common carotid artery. FINDINGS: The left subclavian artery demonstrates a hypoplastic non dominant left vertebral artery with a patent origin. Vessel is seen to opacify to the cranial skull base where it terminates just distal to the left posterior-inferior cerebellar artery. The innominate arteriogram demonstrates patency of the right subclavian artery and the right common carotid artery proximally. The right vertebral artery at its origin demonstrates approximately 30% stenosis associated with an irregular atherosclerotic plaque. The vessel is, otherwise, seen to opacify to the cranial skull base. Opacification is noted into the right vertebrobasilar junction and the right posterior-inferior cerebellar artery. The right common carotid arteriogram demonstrates the right external carotid artery and its major branches to be widely patent. The right internal carotid artery at the bulb to the cranial skull base is also widely patent. PROCEDURE: The 5 [place] JB 1 catheter was advanced distally into the right subclavian artery over an 035 inch guidewire. The guidewire was removed. Good aspiration obtained  from the hub of the 5 French diagnostic catheter. A gentle control arteriogram performed through this demonstrated no evidence of spasms, dissections or of intraluminal filling defects. Again demonstrated was the stenotic origin of the right vertebral artery with moderate proximal tortuosity. The vessel, more distally, demonstrates moderate to moderately severe tortuosity to the right vertebrobasilar junction with slow stagnant flow noted distal to this. Over an 035 inch 300 cm Rosen exchange guidewire, an 8 French 90 cm Neuron Max guide catheter was advanced and positioned just proximal to the origin of the right vertebral artery. Over an 035 inch guidewire, a 132 cm 6 [place] Catalyst guide catheter was then advanced just proximal to the origin of the right vertebral artery. The glidewire was then advanced to the mid right vertebral artery. Subsequent advancement of the 6 [place] Catalyst guide catheter, and the Neuron Max sheath was met herniation into the aortic arch. Attempts were then made using 027 microcatheter with the 018 micro guidewire which resulted in instability of the platform resulting in herniation of the combination into the aortic arch. This approach was abandoned. The right radial artery was then identified with ultrasound, and its morphology documented and radial PACS system. A dorsal palmar anastomosis was verified present. Over an 018 inch micro guidewire, a 6/7 French radial sheath was then advanced without difficulty. The obturator, and the micro guidewire were removed. Good aspiration was obtained from the hub of the radial sheath. 200 mcg of nitroglycerin, and 2.5 mg of verapamil and 1000 units of heparin was then injected through the radial sheath without event. A right radial arteriogram was then performed. Over an 035 inch guidewire, a 5 French Simmons 2 diagnostic catheter was advanced into the proximal 1/3 of the right vertebral artery. The guidewire was removed. Good aspiration  obtained from the hub of the diagnostic catheter. A gentle control arteriogram performed through this demonstrated moderately extensive tortuosity of the right vertebral artery to the cranial skull base. Also noted at this time was patency of the right posterior-inferior cerebellar artery with slow flow noted into the distal 1/3 of the basilar artery which remained occluded. Patency was noted of the anterior-inferior cerebellar arteries. Over an 035 inch 280  cm Terumo exchange guidewire, a 5 cm 072 Benchmark catheter was advanced and positioned at the junction of the middle and the proximal 1/3 of the right vertebral artery. The guidewire was removed. Good aspiration obtained from the hub of the Benchmark guide catheter. A gentle control arteriogram performed through the Benchmark guide catheter demonstrated no evidence of spasms, dissections or of intraluminal filling defects. A combination of an 021 160 cm microcatheter inside of a 120 cm 45 Phenom plus catheter was advanced over an 014 inch support micro guidewire with a moderate J configuration, the combination was advanced without difficulty to the proximal basilar artery. The micro guidewire was then gently advanced into the left posterior cerebral artery proximal petrous region followed by the microcatheter. The guidewire was removed. Good aspiration obtained from the hub of the microcatheter. A gentle control arteriogram performed through the microcatheter demonstrated safe positioning of the tip of the microcatheter which was then connected to continuous heparinized saline infusion. The Benchmark microcatheter was advanced as distally as possible to the right vertebrobasilar junction to the right posterior-inferior cerebellar artery. A 4 mm x 40 mm Solitaire X retrieval device was advanced and positioned in the P1 P2 junction more proximally into the basilar artery. Aspiration was applied at the hub of the Benchmark guide catheter for approximately 2-1/2  minutes. Thereafter, the retrieval device, the microcatheter and the Phenom Plus catheter were retrieved and removed. A control arteriogram performed through the Benchmark guide catheter in the distal right vertebral artery demonstrated complete revascularization of the basilar artery and of the left posterior cerebral artery and the left superior cerebellar artery. No supraclinoid opacification noted of the right P1 segment due to inflow from the anterior circulation. The right superior cerebellar artery remained occluded. A TICI 2C revascularization was achieved. The Benchmark guide catheter was then retrieved more proximally into the right subclavian artery. A gentle control arteriogram performed at this site demonstrated patency of the right vertebral artery extra cranially and intracranially. The 072 guide catheter was removed. A wrist band was then applied at the hub of the radial puncture site. Distal right radial pulse was verified to be present. A flat panel CT of the brain demonstrated no evidence of intracranial hemorrhage. No mass effect was noted either. Patient was left intubated due to his medical condition. He was then transferred to the neuro ICU for post revascularization care. IMPRESSION: Status post endovascular complete revascularization of the occluded basilar artery, the left posterior cerebral artery and the left superior cerebellar artery and partially the right superior cerebellar artery with 1 pass with a 4 mm x 40 mm Solitaire X retrieval device and proximal aspiration. PLAN: Follow-up as per referring MD. Electronically Signed   By: Luanne Bras M.D.   On: 09/07/2022 08:31   IR ANGIO INTRA EXTRACRAN SEL INTERNAL CAROTID UNI R MOD SED  Result Date: 09/07/2022 INDICATION: Severe dysarthria, right-sided nystagmus, bilateral upper and lower extremity weakness and progressive decrease in level of consciousness. 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 EXAM: 1.  EMERGENT LARGE VESSEL OCCLUSION THROMBOLYSIS (POSTERIOR CIRCULATION) COMPARISON:  CTA of the head and neck September 05, 2022. MEDICATIONS: Ancef 2 g IV antibiotic was administered within 1 hour of the procedure. ANESTHESIA/SEDATION: General anesthesia. CONTRAST:  Omnipaque 300 approximately 140 cc. FLUOROSCOPY TIME:  Fluoroscopy Time: 135 minutes 6 seconds (2702 mGy). COMPLICATIONS: None immediate. TECHNIQUE: Following a full explanation of the procedure along with the potential associated complications, an informed witnessed consent was obtained from  the patient's sister. The risks of intracranial hemorrhage of 10%, worsening neurological deficit, ventilator dependency, death and inability to revascularize were all reviewed in detail with the patient's sister. The patient was then put under general anesthesia by the Department of Anesthesiology at [facility]. The right groin was prepped and draped in the usual sterile fashion. Thereafter using modified Seldinger technique, transfemoral access into the right common femoral artery was obtained without difficulty. Over a 0.035 inch guidewire an 8 French 25 cm Pinnacle sheath was inserted. Through this, and also over a 0.035 inch guidewire a 5 [place] JB 1 catheter was advanced to the aortic arch region and selectively positioned in the innominate artery, the left subclavian artery and the right common carotid artery. FINDINGS: The left subclavian artery demonstrates a hypoplastic non dominant left vertebral artery with a patent origin. Vessel is seen to opacify to the cranial skull base where it terminates just distal to the left posterior-inferior cerebellar artery. The innominate arteriogram demonstrates patency of the right subclavian artery and the right common carotid artery proximally. The right vertebral artery at its origin demonstrates approximately 30% stenosis associated with an irregular atherosclerotic plaque. The vessel is, otherwise, seen to  opacify to the cranial skull base. Opacification is noted into the right vertebrobasilar junction and the right posterior-inferior cerebellar artery. The right common carotid arteriogram demonstrates the right external carotid artery and its major branches to be widely patent. The right internal carotid artery at the bulb to the cranial skull base is also widely patent. PROCEDURE: The 5 [place] JB 1 catheter was advanced distally into the right subclavian artery over an 035 inch guidewire. The guidewire was removed. Good aspiration obtained from the hub of the 5 French diagnostic catheter. A gentle control arteriogram performed through this demonstrated no evidence of spasms, dissections or of intraluminal filling defects. Again demonstrated was the stenotic origin of the right vertebral artery with moderate proximal tortuosity. The vessel, more distally, demonstrates moderate to moderately severe tortuosity to the right vertebrobasilar junction with slow stagnant flow noted distal to this. Over an 035 inch 300 cm Rosen exchange guidewire, an 8 French 90 cm Neuron Max guide catheter was advanced and positioned just proximal to the origin of the right vertebral artery. Over an 035 inch guidewire, a 132 cm 6 [place] Catalyst guide catheter was then advanced just proximal to the origin of the right vertebral artery. The glidewire was then advanced to the mid right vertebral artery. Subsequent advancement of the 6 [place] Catalyst guide catheter, and the Neuron Max sheath was met herniation into the aortic arch. Attempts were then made using 027 microcatheter with the 018 micro guidewire which resulted in instability of the platform resulting in herniation of the combination into the aortic arch. This approach was abandoned. The right radial artery was then identified with ultrasound, and its morphology documented and radial PACS system. A dorsal palmar anastomosis was verified present. Over an 018 inch micro guidewire, a  6/7 French radial sheath was then advanced without difficulty. The obturator, and the micro guidewire were removed. Good aspiration was obtained from the hub of the radial sheath. 200 mcg of nitroglycerin, and 2.5 mg of verapamil and 1000 units of heparin was then injected through the radial sheath without event. A right radial arteriogram was then performed. Over an 035 inch guidewire, a 5 French Simmons 2 diagnostic catheter was advanced into the proximal 1/3 of the right vertebral artery. The guidewire was removed. Good aspiration obtained from the  hub of the diagnostic catheter. A gentle control arteriogram performed through this demonstrated moderately extensive tortuosity of the right vertebral artery to the cranial skull base. Also noted at this time was patency of the right posterior-inferior cerebellar artery with slow flow noted into the distal 1/3 of the basilar artery which remained occluded. Patency was noted of the anterior-inferior cerebellar arteries. Over an 035 inch 280 cm Terumo exchange guidewire, a 5 cm 072 Benchmark catheter was advanced and positioned at the junction of the middle and the proximal 1/3 of the right vertebral artery. The guidewire was removed. Good aspiration obtained from the hub of the Benchmark guide catheter. A gentle control arteriogram performed through the Benchmark guide catheter demonstrated no evidence of spasms, dissections or of intraluminal filling defects. A combination of an 021 160 cm microcatheter inside of a 120 cm 45 Phenom plus catheter was advanced over an 014 inch support micro guidewire with a moderate J configuration, the combination was advanced without difficulty to the proximal basilar artery. The micro guidewire was then gently advanced into the left posterior cerebral artery proximal petrous region followed by the microcatheter. The guidewire was removed. Good aspiration obtained from the hub of the microcatheter. A gentle control arteriogram  performed through the microcatheter demonstrated safe positioning of the tip of the microcatheter which was then connected to continuous heparinized saline infusion. The Benchmark microcatheter was advanced as distally as possible to the right vertebrobasilar junction to the right posterior-inferior cerebellar artery. A 4 mm x 40 mm Solitaire X retrieval device was advanced and positioned in the P1 P2 junction more proximally into the basilar artery. Aspiration was applied at the hub of the Benchmark guide catheter for approximately 2-1/2 minutes. Thereafter, the retrieval device, the microcatheter and the Phenom Plus catheter were retrieved and removed. A control arteriogram performed through the Benchmark guide catheter in the distal right vertebral artery demonstrated complete revascularization of the basilar artery and of the left posterior cerebral artery and the left superior cerebellar artery. No supraclinoid opacification noted of the right P1 segment due to inflow from the anterior circulation. The right superior cerebellar artery remained occluded. A TICI 2C revascularization was achieved. The Benchmark guide catheter was then retrieved more proximally into the right subclavian artery. A gentle control arteriogram performed at this site demonstrated patency of the right vertebral artery extra cranially and intracranially. The 072 guide catheter was removed. A wrist band was then applied at the hub of the radial puncture site. Distal right radial pulse was verified to be present. A flat panel CT of the brain demonstrated no evidence of intracranial hemorrhage. No mass effect was noted either. Patient was left intubated due to his medical condition. He was then transferred to the neuro ICU for post revascularization care. IMPRESSION: Status post endovascular complete revascularization of the occluded basilar artery, the left posterior cerebral artery and the left superior cerebellar artery and partially the  right superior cerebellar artery with 1 pass with a 4 mm x 40 mm Solitaire X retrieval device and proximal aspiration. PLAN: Follow-up as per referring MD. Electronically Signed   By: Luanne Bras M.D.   On: 09/07/2022 08:31   Portable Chest xray  Result Date: 09/07/2022 CLINICAL DATA:  Respiratory failure EXAM: PORTABLE CHEST 1 VIEW COMPARISON:  Prior chest x-ray 09/06/2022 FINDINGS: Stable significant enlargement of the cardiopericardial silhouette. Atherosclerotic calcifications are present in the transverse aorta. The patient is intubated. The tip of the endotracheal tube is 7.3 cm above the carina. A  gastric tube is present. The proximal side-hole projects over the GE junction. An implantable loop recorder projects over the left chest. Pulmonary vascular congestion without overt edema. Low lung volumes with bibasilar atelectasis. No pneumothorax. IMPRESSION: 1. Stable cardiomegaly and pulmonary vascular congestion without overt pulmonary edema. 2. Bibasilar atelectasis. 3. Intubated. The tip of the endotracheal tube is 7.3 cm above the carina. 4. Gastric tube is present. The proximal side-hole projects over the GE junction. Electronically Signed   By: Jacqulynn Cadet M.D.   On: 09/07/2022 07:53   ECHOCARDIOGRAM COMPLETE  Result Date: 09/06/2022    ECHOCARDIOGRAM REPORT   Patient Name:   Vincent [name] Date of Exam: 09/06/2022 Medical Rec #:  [id number]      Height:       69.0 in Accession #:    [id number]     Weight:       166.9 lb Date of Birth:  1935/12/14       BSA:          1.913 m Patient Age:    37 years       BP:           113/50 mmHg Patient Gender: M              HR:           86 bpm. Exam Location:  Inpatient Procedure: 2D Echo, Cardiac Doppler, Color Doppler and Intracardiac            Opacification Agent Indications:    Stroke I63.9  History:        Patient has prior history of Echocardiogram examinations, most                 recent 03/29/2022. CHF, Previous Myocardial Infarction and CAD,                  Stroke and COPD, Arrythmias:Tachycardia, Atrial Fibrillation and                 Bradycardia, Signs/Symptoms:Syncope, Hypotension and Chest Pain;                 Risk Factors:Hypertension, Diabetes and Dyslipidemia.  Sonographer:    Ronny Flurry Referring Phys: [place]  1. Left ventricular ejection fraction, by estimation, is 20 to 25%. The left ventricle has severely decreased function. The left ventricle demonstrates global hypokinesis. The left ventricular internal cavity size was severely dilated. Left ventricular diastolic function could not be evaluated.  2. Right ventricular systolic function is mildly reduced. The right ventricular size is mildly enlarged. There is moderately elevated pulmonary artery systolic pressure. The estimated right ventricular systolic pressure is 50.0 mmHg.  3. Left atrial size was severely dilated.  4. Right atrial size was severely dilated.  5. The mitral valve is grossly normal. Moderate mitral valve regurgitation.  6. The tricuspid valve is abnormal. Tricuspid valve regurgitation is moderate to severe.  7. The aortic valve is tricuspid. Aortic valve regurgitation is not visualized. No aortic stenosis is present.  8. The inferior vena cava is dilated in size with <50% respiratory variability, suggesting right atrial pressure of 15 mmHg. Comparison(s): No significant change from prior study. Conclusion(s)/Recommendation(s): No left ventricular mural or apical thrombus/thrombi. FINDINGS  Left Ventricle: Left ventricular ejection fraction, by estimation, is 20 to 25%. The left ventricle has severely decreased function. The left ventricle demonstrates global hypokinesis. Definity contrast agent was given IV to delineate the left ventricular endocardial borders. The left ventricular  internal cavity size was severely dilated. There is no left ventricular hypertrophy. Left ventricular diastolic function could not be evaluated due to atrial  fibrillation. Left ventricular diastolic function could not be evaluated. Right Ventricle: The right ventricular size is mildly enlarged. No increase in right ventricular wall thickness. Right ventricular systolic function is mildly reduced. There is moderately elevated pulmonary artery systolic pressure. The tricuspid regurgitant velocity is 3.28 m/s, and with an assumed right atrial pressure of 15 mmHg, the estimated right ventricular systolic pressure is 41.3 mmHg. Left Atrium: Left atrial size was severely dilated. Right Atrium: Right atrial size was severely dilated. Pericardium: There is no evidence of pericardial effusion. Mitral Valve: The mitral valve is grossly normal. Moderate mitral valve regurgitation. MV peak gradient, 8.2 mmHg. The mean mitral valve gradient is 3.0 mmHg. Tricuspid Valve: The tricuspid valve is abnormal. Tricuspid valve regurgitation is moderate to severe. No evidence of tricuspid stenosis. Aortic Valve: The aortic valve is tricuspid. Aortic valve regurgitation is not visualized. No aortic stenosis is present. Aortic valve mean gradient measures 7.0 mmHg. Aortic valve peak gradient measures 12.8 mmHg. Aortic valve area, by VTI measures 2.11  cm. Pulmonic Valve: The pulmonic valve was grossly normal. Pulmonic valve regurgitation is mild. No evidence of pulmonic stenosis. Aorta: The aortic root and ascending aorta are structurally normal, with no evidence of dilitation. Venous: The inferior vena cava is dilated in size with less than 50% respiratory variability, suggesting right atrial pressure of 15 mmHg. IAS/Shunts: The atrial septum is grossly normal.  LEFT VENTRICLE PLAX 2D LVIDd:         7.00 cm      Diastology LVIDs:         6.40 cm      LV e' medial:    4.37 cm/s LV PW:         1.10 cm      LV E/e' medial:  27.9 LV IVS:        0.80 cm      LV e' lateral:   6.87 cm/s LVOT diam:     2.00 cm      LV E/e' lateral: 17.8 LV SV:         71 LV SV Index:   37 LVOT Area:     3.14 cm  LV  Volumes (MOD) LV vol d, MOD A4C: 179.0 ml LV vol s, MOD A4C: 135.0 ml LV SV MOD A4C:     179.0 ml RIGHT VENTRICLE             IVC RV S prime:     10.70 cm/s  IVC diam: 2.10 cm TAPSE (M-mode): 1.1 cm LEFT ATRIUM             Index        RIGHT ATRIUM           Index LA diam:        4.30 cm 2.25 cm/m   RA Area:     23.10 cm LA Vol (A2C):   69.7 ml 36.43 ml/m  RA Volume:   77.90 ml  40.72 ml/m LA Vol (A4C):   50.6 ml 26.45 ml/m LA Biplane Vol: 58.9 ml 30.79 ml/m  AORTIC VALVE                     PULMONIC VALVE AV Area (Vmax):    2.28 cm      PR End Diast Vel: 8.53 msec AV Area (Vmean):   2.11 cm  AV Area (VTI):     2.11 cm AV Vmax:           179.00 cm/s AV Vmean:          129.000 cm/s AV VTI:            0.336 m AV Peak Grad:      12.8 mmHg AV Mean Grad:      7.0 mmHg LVOT Vmax:         129.67 cm/s LVOT Vmean:        86.833 cm/s LVOT VTI:          0.226 m LVOT/AV VTI ratio: 0.67  AORTA Ao Root diam: 3.40 cm Ao Asc diam:  2.60 cm MITRAL VALVE                TRICUSPID VALVE MV Area VTI:  2.48 cm      TR Peak grad:   43.0 mmHg MV Peak grad: 8.2 mmHg      TR Vmax:        328.00 cm/s MV Mean grad: 3.0 mmHg MV Vmax:      1.43 m/s      SHUNTS MV Vmean:     79.0 cm/s     Systemic VTI:  0.23 m MR Peak grad: 82.8 mmHg     Systemic Diam: 2.00 cm MR Mean grad: 48.0 mmHg MR Vmax:      455.00 cm/s MR Vmean:     327.0 cm/s MV E velocity: 122.00 cm/s Eleonore Chiquito MD Electronically signed by Eleonore Chiquito MD Signature Date/Time: 09/06/2022/1:46:33 PM    Final    MR BRAIN WO CONTRAST  Result Date: 09/06/2022 CLINICAL DATA:  Stroke follow-up. Postop thrombectomy basilar artery 09/06/2022 EXAM: MRI HEAD WITHOUT CONTRAST MRA HEAD WITHOUT CONTRAST TECHNIQUE: Multiplanar, multi-echo pulse sequences of the brain and surrounding structures were acquired without intravenous contrast. Angiographic images of the Circle of Willis were acquired using MRA technique without intravenous contrast. COMPARISON:  CT head and CT angio head  08/30/2022 FINDINGS: MRI HEAD FINDINGS Brain: Moderate to large area of acute infarction right inferior cerebellum. Moderately large area of acute infarct left superior cerebellum with smaller areas of infarct in the left mid cerebellum. Small area of acute infarct in the left pons. Small area of acute infarct right frontal white matter. . 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 Large area of chronic infarct right MCA territory involving the right posterior frontal and parietal lobe. Chronic blood products in the right MCA infarct. Chronic microvascular ischemic change in the white matter right greater than left. No  mass identified.  Generalized atrophy without hydrocephalus. Vascular: Normal arterial flow voids at the skull base. Skull and upper cervical spine: No focal skeletal abnormality Sinuses/Orbits: Mucosal edema paranasal sinuses. Air-fluid level right maxillary sinus. Bilateral cataract extraction Other: None MRA HEAD FINDINGS Anterior circulation: Internal carotid artery patent bilaterally without stenosis. Anterior and middle cerebral arteries patent bilaterally without significant stenosis. Decreased signal in the middle cerebral artery M1 segment bilaterally felt to be due to vascular tortuosity. Posterior circulation: Right vertebral artery is tortuous and dominant and supplies the basilar. Right vertebral artery is patent. Right PICA not visualized. Left vertebral artery ends in PICA Basilar is patent. This is occluded prior to procedure today. Superior cerebellar and posterior cerebral arteries patent bilaterally. Fetal origin right posterior cerebral artery Anatomic variants: None IMPRESSION: 1. Multiple areas of acute infarct involving the cerebellum bilaterally, left pons, and right frontal white  matter. 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 Large area of chronic infarct and mild chronic hemorrhage and in the  right MCA territory. 2. 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 Right vertebral artery patent. Left vertebral artery ends in PICA. Electronically Signed   By: Franchot Gallo M.D.   On: 09/06/2022 11:50   MR ANGIO HEAD WO CONTRAST  Result Date: 09/06/2022 CLINICAL DATA:  Stroke follow-up. Postop thrombectomy basilar artery 09/06/2022 EXAM: MRI HEAD WITHOUT CONTRAST MRA HEAD WITHOUT CONTRAST TECHNIQUE: Multiplanar, multi-echo pulse sequences of the brain and surrounding structures were acquired without intravenous contrast. Angiographic images of the Circle of Willis were acquired using MRA technique without intravenous contrast. COMPARISON:  CT head and CT angio head 09/27/2022 FINDINGS: MRI HEAD FINDINGS Brain: Moderate to large area of acute infarction right inferior cerebellum. Moderately large area of acute infarct left superior cerebellum with smaller areas of infarct in the left mid cerebellum. Small area of acute infarct in the left pons. Small area of acute infarct right frontal white matter. . 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 Large area of chronic infarct right MCA territory involving the right posterior frontal and parietal lobe. Chronic blood products in the right MCA infarct. Chronic microvascular ischemic change in the white matter right greater than left. No  mass identified.  Generalized atrophy without hydrocephalus. Vascular: Normal arterial flow voids at the skull base. Skull and upper cervical spine: No focal skeletal abnormality Sinuses/Orbits: Mucosal edema paranasal sinuses. Air-fluid level right maxillary sinus. Bilateral cataract extraction Other: None MRA HEAD FINDINGS Anterior circulation: Internal carotid artery patent bilaterally without stenosis. Anterior and middle cerebral arteries patent bilaterally without significant stenosis. Decreased signal in the middle cerebral artery M1 segment bilaterally felt to be due to  vascular tortuosity. Posterior circulation: Right vertebral artery is tortuous and dominant and supplies the basilar. Right vertebral artery is patent. Right PICA not visualized. Left vertebral artery ends in PICA Basilar is patent. This is occluded prior to procedure today. Superior cerebellar and posterior cerebral arteries patent bilaterally. Fetal origin right posterior cerebral artery Anatomic variants: None IMPRESSION: 1. Multiple areas of acute infarct involving the cerebellum bilaterally, left pons, and right frontal white matter. 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 Large area of chronic infarct and mild chronic hemorrhage and in the right MCA territory. 2. 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 Right vertebral artery patent. Left vertebral artery ends in PICA. Electronically Signed   By: Franchot Gallo M.D.   On: 09/06/2022 11:50   DG Abd 1 View  Result Date: 09/06/2022 CLINICAL DATA:  Orogastric tube placement. EXAM: ABDOMEN - 1 VIEW COMPARISON:  None Available. FINDINGS: Distal tip of nasogastric tube is seen in expected position of the stomach. IMPRESSION: Distal tip of nasogastric tube is seen in expected position of the stomach. Electronically Signed   By: Marijo Conception M.D.   On: 09/06/2022 08:12   CT Angio Head W or Wo Contrast  Result Date: 09/18/2022 CLINICAL DATA:  Dizziness and vomiting. Right-sided gaze preference. Stroke workup. EXAM: CT ANGIOGRAPHY HEAD TECHNIQUE: Multidetector CT imaging of the head was performed using the standard protocol during bolus administration of intravenous contrast. Multiplanar CT image reconstructions and MIPs were obtained to evaluate the vascular anatomy. RADIATION DOSE REDUCTION: This exam was performed according to the departmental dose-optimization program which includes automated exposure control, adjustment of the mA and/or kV according to patient  size and/or use of iterative reconstruction  technique. CONTRAST:  33m OMNIPAQUE IOHEXOL 350 MG/ML SOLN COMPARISON:  Same-day noncontrast CT head. FINDINGS: CTA HEAD Anterior circulation: The imaged portions of the ICAs in the neck are patent. There is calcified plaque in the carotid siphons resulting in moderate to severe stenosis bilaterally. The right M1 segment is patent. There is multifocal high-grade stenosis of the right M2 and M3 branches corresponding to the chronic MCA distribution infarct (7-96, 7-105, 7-117). The ACAs are patent, without proximal high-grade stenosis or occlusion. The anterior communicating artery is normal. There is no aneurysm or AVM. The left M1 segment is patent. The distal left MCA branches appear overall patent, without proximal high-grade stenosis or occlusion. Posterior circulation: The left V4 segment is occluded there is calcified plaque in the proximal V4 segment resulting in moderate to severe stenosis. There is poor opacification of the basilar artery throughout with the apparent occlusion distally. The bilateral PCAs are supplied by posterior communicating arteries with likely retrograde flow into the P1 segments. There is no proximal high-grade stenosis in the PCAs. There is no aneurysm or AVM. Venous sinuses: Suboptimally evaluated due to bolus timing. Anatomic variants: As above. Review of the MIP images confirms the above findings. IMPRESSION: 1. 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 The bilateral PCAs are supplied by posterior communicating arteries with likely retrograde flow into the P1 segments. 2. Multifocal high-grade stenosis of the right M2 and M3 branches relatively diminished perfusion corresponding to the chronic MCA distribution infarct. 3. Moderate to severe stenosis in the bilateral carotid siphons. These results were called by telephone at the time of interpretation on 09/03/2022 at 7:40 pm to provider Dr  LCheral Marker who verbally acknowledged these results. Electronically Signed   By: PValetta MoleM.D.   On: 09/06/2022 19:41   UKoreaAbdomen Limited RUQ (LIVER/GB)  Result Date: 09/10/2022 CLINICAL DATA:  Vomiting EXAM: ULTRASOUND ABDOMEN LIMITED RIGHT UPPER QUADRANT COMPARISON:  CT 09/07/2022 earlier.  Older exams as well. FINDINGS: Gallbladder: Gallbladder is mildly distended with dependent stones. No wall thickening. There is some trace ascites. Common bile duct: Diameter: 3 mm Liver: No focal lesion identified. Within normal limits in parenchymal echogenicity. Portal vein is patent on color Doppler imaging with normal direction of blood flow towards the liver. Other: Trace free fluid. IMPRESSION: Distended gallbladder with stones. No ductal dilatation. No further sonographic evidence of acute cholecystitis at this time. Electronically Signed   By: AJill SideM.D.   On: 09/28/2022 18:30   CT Head Wo Contrast  Result Date: 08/29/2022 CLINICAL DATA:  Neck trauma (Age >= 65y); Head trauma, minor (Age >= 65y) EXAM: CT HEAD WITHOUT CONTRAST CT CERVICAL SPINE WITHOUT CONTRAST TECHNIQUE: Multidetector CT imaging of the head and cervical spine was performed following the standard protocol without intravenous contrast. Multiplanar CT image reconstructions of the cervical spine were also generated. RADIATION DOSE REDUCTION: This exam was performed according to the departmental dose-optimization program which includes automated exposure control, adjustment of the mA and/or kV according to patient size and/or use of iterative reconstruction technique. COMPARISON:  CT head and C-spine 07/20/2022 FINDINGS: CT HEAD FINDINGS Brain: Cerebral ventricle sizes are concordant with the degree of cerebral volume loss. 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 Chronic bilateral cerebellar infarctions. Patchy and confluent areas of decreased attenuation are noted throughout the deep and  periventricular white matter of the cerebral hemispheres bilaterally, compatible  with chronic microvascular ischemic disease. No evidence of large-territorial acute infarction. No parenchymal hemorrhage. No mass lesion. No extra-axial collection. No mass effect or midline shift. No hydrocephalus. Basilar cisterns are patent. Vascular: No hyperdense vessel. Skull: No acute fracture or focal lesion. Sinuses/Orbits: Paranasal sinuses and mastoid air cells are clear. Bilateral lens replacement. Otherwise the orbits are unremarkable. Other: None. CT CERVICAL SPINE FINDINGS Alignment: Normal. Skull base and vertebrae: Multilevel moderate severe degenerative changes of the spine with associated posterior disc osteophyte complex formation at the C3-C4 level and C4-C5 level. Possible smaller posterior disc osteophyte complex formation at the C6-C7 level. No associated severe osseous neural foraminal or central canal stenosis. No acute fracture. No aggressive appearing focal osseous lesion or focal pathologic process. Soft tissues and spinal canal: No prevertebral fluid or swelling. No visible canal hematoma. Upper chest: Unremarkable. Other: None. IMPRESSION: 1. No acute intracranial abnormality. 2. No acute displaced fracture or traumatic listhesis of the cervical spine. Electronically Signed   By: Iven Finn M.D.   On: 09/20/2022 17:32   CT Cervical Spine Wo Contrast  Result Date: 08/29/2022 CLINICAL DATA:  Neck trauma (Age >= 65y); Head trauma, minor (Age >= 65y) EXAM: CT HEAD WITHOUT CONTRAST CT CERVICAL SPINE WITHOUT CONTRAST TECHNIQUE: Multidetector CT imaging of the head and cervical spine was performed following the standard protocol without intravenous contrast. Multiplanar CT image reconstructions of the cervical spine were also generated. RADIATION DOSE REDUCTION: This exam was performed according to the departmental dose-optimization program which includes automated exposure control, adjustment of the mA  and/or kV according to patient size and/or use of iterative reconstruction technique. COMPARISON:  CT head and C-spine 07/20/2022 FINDINGS: CT HEAD FINDINGS Brain: Cerebral ventricle sizes are concordant with the degree of cerebral volume loss. 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 Chronic bilateral cerebellar infarctions. Patchy and confluent areas of decreased attenuation are noted throughout the deep and periventricular white matter of the cerebral hemispheres bilaterally, compatible with chronic microvascular ischemic disease. No evidence of large-territorial acute infarction. No parenchymal hemorrhage. No mass lesion. No extra-axial collection. No mass effect or midline shift. No hydrocephalus. Basilar cisterns are patent. Vascular: No hyperdense vessel. Skull: No acute fracture or focal lesion. Sinuses/Orbits: Paranasal sinuses and mastoid air cells are clear. Bilateral lens replacement. Otherwise the orbits are unremarkable. Other: None. CT CERVICAL SPINE FINDINGS Alignment: Normal. Skull base and vertebrae: Multilevel moderate severe degenerative changes of the spine with associated posterior disc osteophyte complex formation at the C3-C4 level and C4-C5 level. Possible smaller posterior disc osteophyte complex formation at the C6-C7 level. No associated severe osseous neural foraminal or central canal stenosis. No acute fracture. No aggressive appearing focal osseous lesion or focal pathologic process. Soft tissues and spinal canal: No prevertebral fluid or swelling. No visible canal hematoma. Upper chest: Unremarkable. Other: None. IMPRESSION: 1. No acute intracranial abnormality. 2. No acute displaced fracture or traumatic listhesis of the cervical spine. Electronically Signed   By: Iven Finn M.D.   On: 09/07/2022 17:32   CT CHEST ABDOMEN PELVIS W CONTRAST  Result Date: 09/25/2022 CLINICAL DATA:  Sepsis.  Fall.  Unknown time down. EXAM: CT  CHEST, ABDOMEN, AND PELVIS WITH CONTRAST TECHNIQUE: Multidetector CT imaging of the chest, abdomen and pelvis was performed following the standard protocol during bolus administration of intravenous contrast. RADIATION DOSE REDUCTION: This exam was performed according to the departmental dose-optimization program which includes automated exposure control, adjustment of the mA and/or kV according to patient size and/or  use of iterative reconstruction technique. CONTRAST:  54m OMNIPAQUE IOHEXOL 350 MG/ML SOLN COMPARISON:  X-ray thoracic and lumbar spine 03/31/2020 FINDINGS: CHEST: Cardiovascular: No aortic injury. The thoracic aorta is normal in caliber. The heart is enlarged in size. No significant pericardial effusion. Atherosclerotic plaque. Four-vessel coronary calcification. Mediastinum/Nodes: No pneumomediastinum. No mediastinal hematoma. The esophagus is unremarkable. The thyroid is unremarkable. The central airways are patent. No mediastinal, hilar, or axillary lymphadenopathy. Lungs/Pleura: Peribronchovascular ground-glass airspace opacities of the lingula and bilateral lower lobes. No pulmonary nodule. No pulmonary mass. No pulmonary contusion or laceration. No pneumatocele formation. Trace right pleural effusion. Possible tiny trace left pleural effusion. No pneumothorax. No hemothorax. Musculoskeletal/Chest wall: No chest wall mass. No acute rib or sternal fracture. Old healed sternal fracture. Old healed left rib fractures. No spinal fracture. Mild to moderate degenerative changes of the shoulders, right greater than left. ABDOMEN / PELVIS: Hepatobiliary: Not enlarged. Heterogeneous appearance of the hepatic parenchyma likely due to streak artifact originating from the bilateral upper extremities along the patient's sides. No focal lesion. No laceration or subcapsular hematoma. Calcified gallstones within the gallbladder lumen. Suggestion of fundal gallbladder wall thickening/pericholecystic fluid. No  biliary ductal dilatation. Pancreas: Normal pancreatic contour. No main pancreatic duct dilatation. Spleen: Not enlarged. No focal lesion. No laceration, subcapsular hematoma, or vascular injury. Adrenals/Urinary Tract: No nodularity bilaterally. Bilateral kidneys enhance symmetrically. No hydronephrosis. No contusion, laceration, or subcapsular hematoma. Fluid dense lesion of the right kidney likely represents a simple renal cyst. Simple renal cysts, in the absence of clinically indicated signs/symptoms, require no independent follow-up. No injury to the vascular structures or collecting systems. No hydroureter. The urinary bladder is unremarkable. On delayed imaging, there is no urothelial wall thickening and there are no filling defects in the opacified portions of the bilateral collecting systems or ureters. Stomach/Bowel: No small or large bowel wall thickening or dilatation. Colonic diverticulosis. The appendix is unremarkable. Vasculature/Lymphatics: Severe atherosclerotic plaque. No abdominal aorta or iliac aneurysm. No active contrast extravasation or pseudoaneurysm. No abdominal, pelvic, inguinal lymphadenopathy. Reproductive: Prostate is borderline enlarged in size. Partially visualized at least small to moderate volume right hydrocele. Other: No simple free fluid ascites. No pneumoperitoneum. No hemoperitoneum. No mesenteric hematoma identified. No organized fluid collection. Musculoskeletal: No significant soft tissue hematoma. No acute pelvic fracture. No acute spinal fracture. Chronic compression fracture of the L1 and L4 vertebral bodies grossly stable in appearance. Grade 1 anterolisthesis of L3 on L4 and L4 on L5. Multilevel degenerative changes of the spine. Ports and Devices: None. IMPRESSION: 1. Peribronchovascular ground-glass airspace opacities of the lingula and bilateral lower lobes. Findings suggestive of infection/inflammation and consistent with chest x-ray. Followup PA and lateral chest  X-ray is recommended in 3-4 weeks following therapy to ensure resolution. 2. Trace right pleural effusion. Possible tiny trace left pleural effusion. 3. Cholelithiasis with suggestion of fundal gallbladder wall thickening/pericholecystic fluid. Correlate with liver function tests. Consider right upper quadrant ultrasound for a more sensitive evaluation of the gallbladder if clinically indicated. 4. No acute traumatic injury to the chest, abdomen, or pelvis. 5. No acute fracture or traumatic malalignment of the thoracic or lumbar spine. 6. Other imaging findings of potential clinical significance: Colonic diverticulosis with no acute diverticulitis. Cardiomegaly. Borderline prostatomegaly. Partially visualized at least small to moderate volume right hydrocele .Aortic Atherosclerosis (ICD10-I70.0) -including four-vessel coronary artery calcification. Electronically Signed   By: MIven FinnM.D.   On: 09/06/2022 17:26   DG Chest Port 1 View  Result Date: 09/17/2022 CLINICAL DATA:  Shortness of breath and weakness. EXAM: PORTABLE CHEST 1 VIEW COMPARISON:  07/20/2022 FINDINGS: Patient is slightly rotated to the right. Lungs are adequately inflated demonstrate subtle hazy prominence of the pulmonary vasculature as well as minimal bibasilar density. Findings likely due to mild interstitial edema/vascular congestion. Not exclude infection in the lung bases. Stable cardiomegaly. Metallic device projects over the anterior left chest wall unchanged. Remainder of the exam is unchanged. IMPRESSION: 1. Findings suggesting mild interstitial edema/vascular congestion. Infection in the lung bases is possible. 2. Stable cardiomegaly. Electronically Signed   By: Marin Olp M.D.   On: 09/03/2022 15:35    Microbiology Recent Results (from the past 240 hour(s))  Culture, Respiratory w Gram Stain     Status: None   Collection Time: 09/10/22  5:48 PM   Specimen: Tracheal Aspirate; Respiratory  Result Value Ref Range Status    Specimen Description TRACHEAL ASPIRATE  Final   Special Requests NONE  Final   Gram Stain   Final    ABUNDANT WBC PRESENT, PREDOMINANTLY PMN FEW GRAM POSITIVE RODS RARE GRAM POSITIVE COCCI IN SINGLES IN PAIRS FEW GRAM NEGATIVE RODS Performed at [facility], [place] [address], [place], [place] [address]    Culture   Final    FEW ENTEROBACTER GERGOVIAE ABUNDANT STREPTOCOCCUS CONSTELLATUS    Report Status 09/14/2022 FINAL  Final   Organism ID, Bacteria ENTEROBACTER GERGOVIAE  Final   Organism ID, Bacteria STREPTOCOCCUS CONSTELLATUS  Final      Susceptibility   Streptococcus constellatus - MIC*    PENICILLIN <=0.06 SENSITIVE Sensitive     CEFTRIAXONE 0.25 SENSITIVE Sensitive     ERYTHROMYCIN <=0.12 SENSITIVE Sensitive     LEVOFLOXACIN 0.5 SENSITIVE Sensitive     VANCOMYCIN 0.25 SENSITIVE Sensitive     * ABUNDANT STREPTOCOCCUS CONSTELLATUS   Enterobacter gergoviae - MIC*    CEFAZOLIN <=4 SENSITIVE Sensitive     CEFEPIME <=0.12 SENSITIVE Sensitive     CEFTAZIDIME <=1 SENSITIVE Sensitive     CEFTRIAXONE <=0.25 SENSITIVE Sensitive     CIPROFLOXACIN <=0.25 SENSITIVE Sensitive     GENTAMICIN <=1 SENSITIVE Sensitive     IMIPENEM <=0.25 SENSITIVE Sensitive     TRIMETH/SULFA <=20 SENSITIVE Sensitive     PIP/TAZO <=4 SENSITIVE Sensitive     * FEW ENTEROBACTER GERGOVIAE  Culture, blood (Routine X 2) w Reflex to ID Panel     Status: Abnormal   Collection Time: 09/10/22  6:06 PM   Specimen: BLOOD  Result Value Ref Range Status   Specimen Description BLOOD SITE NOT SPECIFIED  Final   Special Requests   Final    BOTTLES DRAWN AEROBIC AND ANAEROBIC Blood Culture adequate volume   Culture  Setup Time   Final    GRAM POSITIVE COCCI IN CLUSTERS IN BOTH AEROBIC AND ANAEROBIC BOTTLES CRITICAL RESULT CALLED TO, READ BACK BY AND VERIFIED WITH: PHARMD L.BELL AT 1206 ON 09/11/2022 BY T.SAAD. Performed at [facility], [place] [address], [place], [place] [address]    Culture  STAPHYLOCOCCUS AUREUS (A)  Final   Report Status 09/13/2022 FINAL  Final   Organism ID, Bacteria STAPHYLOCOCCUS AUREUS  Final      Susceptibility   Staphylococcus aureus - MIC*    CIPROFLOXACIN <=0.5 SENSITIVE Sensitive     ERYTHROMYCIN <=0.25 SENSITIVE Sensitive     GENTAMICIN <=0.5 SENSITIVE Sensitive     OXACILLIN <=0.25 SENSITIVE Sensitive     TETRACYCLINE <=1 SENSITIVE Sensitive     VANCOMYCIN 1 SENSITIVE Sensitive  TRIMETH/SULFA <=10 SENSITIVE Sensitive     CLINDAMYCIN <=0.25 SENSITIVE Sensitive     RIFAMPIN <=0.5 SENSITIVE Sensitive     Inducible Clindamycin NEGATIVE Sensitive     * STAPHYLOCOCCUS AUREUS  Culture, blood (Routine X 2) w Reflex to ID Panel     Status: Abnormal   Collection Time: 09/10/22  6:06 PM   Specimen: BLOOD  Result Value Ref Range Status   Specimen Description BLOOD SITE NOT SPECIFIED  Final   Special Requests   Final    BOTTLES DRAWN AEROBIC AND ANAEROBIC Blood Culture adequate volume   Culture  Setup Time   Final    GRAM POSITIVE COCCI IN CLUSTERS IN BOTH AEROBIC AND ANAEROBIC BOTTLES CRITICAL VALUE NOTED.  VALUE IS CONSISTENT WITH PREVIOUSLY REPORTED AND CALLED VALUE.    Culture (A)  Final    STAPHYLOCOCCUS AUREUS SUSCEPTIBILITIES PERFORMED ON PREVIOUS CULTURE WITHIN THE LAST 5 DAYS. Performed at [facility], [place] [address], [place], [place] [address]    Report Status 09/13/2022 FINAL  Final  Blood Culture ID Panel (Reflexed)     Status: Abnormal   Collection Time: 09/10/22  6:06 PM  Result Value Ref Range Status   Enterococcus faecalis NOT DETECTED NOT DETECTED Final   Enterococcus Faecium NOT DETECTED NOT DETECTED Final   Listeria monocytogenes NOT DETECTED NOT DETECTED Final   Staphylococcus species DETECTED (A) NOT DETECTED Final    Comment: CRITICAL RESULT CALLED TO, READ BACK BY AND VERIFIED WITH: PHARMD L.BELL AT 1206 ON 09/11/2022 BY T.SAAD.    Staphylococcus aureus (BCID) DETECTED (A) NOT DETECTED Final    Comment:  CRITICAL RESULT CALLED TO, READ BACK BY AND VERIFIED WITH: PHARMD L.BELL AT 1206 ON 09/11/2022 BY T.SAAD.    Staphylococcus epidermidis NOT DETECTED NOT DETECTED Final   Staphylococcus lugdunensis NOT DETECTED NOT DETECTED Final   Streptococcus species NOT DETECTED NOT DETECTED Final   Streptococcus agalactiae NOT DETECTED NOT DETECTED Final   Streptococcus pneumoniae NOT DETECTED NOT DETECTED Final   Streptococcus pyogenes NOT DETECTED NOT DETECTED Final   A.calcoaceticus-baumannii NOT DETECTED NOT DETECTED Final   Bacteroides fragilis NOT DETECTED NOT DETECTED Final   Enterobacterales NOT DETECTED NOT DETECTED Final   Enterobacter cloacae complex NOT DETECTED NOT DETECTED Final   Escherichia coli NOT DETECTED NOT DETECTED Final   Klebsiella aerogenes NOT DETECTED NOT DETECTED Final   Klebsiella oxytoca NOT DETECTED NOT DETECTED Final   Klebsiella pneumoniae NOT DETECTED NOT DETECTED Final   Proteus species NOT DETECTED NOT DETECTED Final   Salmonella species NOT DETECTED NOT DETECTED Final   Serratia marcescens NOT DETECTED NOT DETECTED Final   Haemophilus influenzae NOT DETECTED NOT DETECTED Final   Neisseria meningitidis NOT DETECTED NOT DETECTED Final   Pseudomonas aeruginosa NOT DETECTED NOT DETECTED Final   Stenotrophomonas maltophilia NOT DETECTED NOT DETECTED Final   Candida albicans NOT DETECTED NOT DETECTED Final   Candida auris NOT DETECTED NOT DETECTED Final   Candida glabrata NOT DETECTED NOT DETECTED Final   Candida krusei NOT DETECTED NOT DETECTED Final   Candida parapsilosis NOT DETECTED NOT DETECTED Final   Candida tropicalis NOT DETECTED NOT DETECTED Final   Cryptococcus neoformans/gattii NOT DETECTED NOT DETECTED Final   Meth resistant mecA/C and MREJ NOT DETECTED NOT DETECTED Final    Comment: Performed at [facility], [address], [place], [place] [address]  Respiratory (~20 pathogens) panel by PCR     Status: None   Collection Time: 09/11/22  1:06 PM  Specimen: Nasopharyngeal Swab; Respiratory  Result Value Ref Range Status   Adenovirus NOT DETECTED NOT DETECTED Final   Coronavirus 229E NOT DETECTED NOT DETECTED Final    Comment: (NOTE) The Coronavirus on the Respiratory Panel, DOES NOT test for the novel  Coronavirus (2019 nCoV)    Coronavirus HKU1 NOT DETECTED NOT DETECTED Final   Coronavirus NL63 NOT DETECTED NOT DETECTED Final   Coronavirus OC43 NOT DETECTED NOT DETECTED Final   Metapneumovirus NOT DETECTED NOT DETECTED Final   Rhinovirus / Enterovirus NOT DETECTED NOT DETECTED Final   Influenza A NOT DETECTED NOT DETECTED Final   Influenza B NOT DETECTED NOT DETECTED Final   Parainfluenza Virus 1 NOT DETECTED NOT DETECTED Final   Parainfluenza Virus 2 NOT DETECTED NOT DETECTED Final   Parainfluenza Virus 3 NOT DETECTED NOT DETECTED Final   Parainfluenza Virus 4 NOT DETECTED NOT DETECTED Final   Respiratory Syncytial Virus NOT DETECTED NOT DETECTED Final   Bordetella pertussis NOT DETECTED NOT DETECTED Final   Bordetella Parapertussis NOT DETECTED NOT DETECTED Final   Chlamydophila pneumoniae NOT DETECTED NOT DETECTED Final   Mycoplasma pneumoniae NOT DETECTED NOT DETECTED Final    Comment: Performed at [facility], [place], [place], [place] [address]  Culture, blood (Routine X 2) w Reflex to ID Panel     Status: None   Collection Time: 09/11/22  5:46 PM   Specimen: BLOOD  Result Value Ref Range Status   Specimen Description BLOOD SITE NOT SPECIFIED  Final   Special Requests   Final    BOTTLES DRAWN AEROBIC ONLY Blood Culture results may not be optimal due to an inadequate volume of blood received in culture bottles   Culture   Final    NO GROWTH 5 DAYS Performed at [facility], [place] [address], [place], [place] [address]    Report Status 09/16/2022 FINAL  Final  Culture, blood (Routine X 2) w Reflex to ID Panel     Status: None   Collection Time: 09/13/22 11:09 AM   Specimen: BLOOD RIGHT ARM   Result Value Ref Range Status   Specimen Description BLOOD RIGHT ARM  Final   Special Requests   Final    BOTTLES DRAWN AEROBIC AND ANAEROBIC Blood Culture adequate volume   Culture   Final    NO GROWTH 5 DAYS Performed at [facility], [place] [address], [place], [place] [address]    Report Status 09/18/2022 FINAL  Final  Culture, blood (Routine X 2) w Reflex to ID Panel     Status: None   Collection Time: 09/13/22 11:11 AM   Specimen: BLOOD RIGHT ARM  Result Value Ref Range Status   Specimen Description BLOOD RIGHT ARM  Final   Special Requests   Final    BOTTLES DRAWN AEROBIC AND ANAEROBIC Blood Culture adequate volume   Culture   Final    NO GROWTH 5 DAYS Performed at [facility], [place] [address] [place], [place] [address]    Report Status 09/18/2022 FINAL  Final    Lab Basic Metabolic Panel: Recent Labs  Lab 09/14/22 0112 09/14/22 0917 09/15/22 0311 09/15/22 0915 09/15/22 2134 09/16/22 0343 09/17/22 0614 09/18/22 0501 10/11/22 0435  NA 153*   < > 156*   < > 158* 157* 156* 155* 150*  K 3.6  --  3.3*  --   --   --  3.3* 3.5 3.1*  CL 117*  --  121*  --   --   --  119* 116* 116*  CO2 26  --  32  --   --   --  '27 28 29  '$ GLUCOSE 285*  --  238*  --   --   --  168* 237* 140*  BUN 29*  --  28*  --   --   --  29* 32* 33*  CREATININE 1.03  --  1.09  --   --   --  1.05 1.17 1.16  CALCIUM 7.7*  --  8.0*  --   --   --  8.0* 8.0* 7.8*  MG 2.1  --  2.2  --   --   --   --   --   --   PHOS 1.9*  --  2.4*  --   --   --  2.7  --   --    < > = values in this interval not displayed.   Liver Function Tests: Recent Labs  Lab 09/14/22 0112 09/15/22 0311  AST 70* 147*  ALT 21 35  ALKPHOS 94 107  BILITOT 0.5 0.4  PROT 5.8* 6.1*  ALBUMIN 1.9* 1.8*   No results for input(s): "LIPASE", "AMYLASE" in the last 168 hours. No results for input(s): "AMMONIA" in the last 168 hours. CBC: Recent Labs  Lab 09/14/22 0112 09/15/22 0311 09/17/22 0614 09/18/22 0501  10-09-22 0435  WBC 8.9 9.8 9.6 9.7 8.1  NEUTROABS 7.4 8.7*  --   --   --   HGB 10.2* 9.2* 9.6* 8.6* 9.1*  HCT 33.7* 30.3* 31.8* 30.3* 30.4*  MCV 88.7 89.4 88.6 89.9 89.1  PLT 149* 198 236 235 231   Cardiac Enzymes: No results for input(s): "CKTOTAL", "CKMB", "CKMBINDEX", "TROPONINI" in the last 168 hours. Sepsis Labs: Recent Labs  Lab 09/15/22 0311 09/17/22 0614 09/18/22 0501 2022/10/09 0435  WBC 9.8 9.6 9.7 8.1    Procedures/Operations  Mechanical thrombectomy of basilar artery Intubation, extubation   Rosalin Hawking 09/20/2022, 1:52 PM

## 2022-09-29 NOTE — Plan of Care (Signed)
I received a call from the ICU charge nurse.  Patient has been made comfort measures only this evening, and the RN wanted orders for transferring the patient out of the ICU.  Transfer orders were placed.  -- Amie Portland, MD Neurologist [facility] Pager: [phone]

## 2022-09-29 NOTE — Progress Notes (Signed)
Attempted to call patient's sister [name] to notify her of patient passing. Phone went straight to voicemail, left a voicemail asking that the sister call the unit when she receives the voicemail. No other contacts available in the patient's chart.

## 2022-09-29 NOTE — Progress Notes (Signed)
[facility] ADULT ICU REPLACEMENT PROTOCOL   The patient does apply for the [facility] Adult ICU Electrolyte Replacment Protocol based on the criteria listed below:   1.Exclusion criteria: TCTS, ECMO, Dialysis, and Myasthenia Gravis patients 2. Is GFR >/= 30 ml/min? Yes.    Patient's GFR today is > 60 3. Is SCr </= 2? Yes.   Patient's SCr is 1.16 mg/dL 4. Did SCr increase >/= 0.5 in 24 hours? No. 5.Pt's weight >40kg  Yes.   6. Abnormal electrolyte(s): K+ 3.1  7. Electrolytes replaced per protocol 8.  Call MD STAT for K+ </= 2.5, Phos </= 1, or Mag </= 1 Physician:  Dr Rebeca Allegra, Elfredia Nevins 2022-10-17 6:37 AM

## 2022-09-29 NOTE — Progress Notes (Incomplete)
NAME:  Vincent [name], MRN:  [id number], DOB:  1936-01-20, LOS: 59 ADMISSION DATE:  08/31/2022, CONSULTATION DATE:  09/06/2022 REFERRING MD:  Marjory Lies - NIR CHIEF COMPLAINT:  S/p basilar artery occlusion, vent Management   History of Present Illness:  87 year old man who presented to [facility] ED 1/8 as a Code Stroke after being found down at home. Presented with R gaze preference, L-sided deficits and dysarthria. PMHx significant for HTN, HLD, AF (on Eliquis), CHF (EF 25-30%), CAD s/p stent (2004), prior CVA with residual L-sided deficits, COPD, T2DM.  CTA Head/Neck demonstrated distal basilar artery occlusion, left V4 occlusion. He was seen by neurology and IR and was taken to IR for revascularization of distal basilar artery; TICI 2C revascularization achieved. Post-procedure CT negative for hemorrhage.  Post-procedure, he was left intubated and was transferred to ICU.  PCCM was asked to see for vent management.  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 s/p remote stent to Cx 2004 with chronic stable angina in context of residual circumflex disease.   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 1/11 agitated with vent wean 1/12 less interactive. CT head with no acute findings. Failed SBT with tachypnea. Given lasix with 2L out  1/13 blood culture positive for MSSA 1/15 Repeat CT head (09/12/2022 05:41): Stable post-cerebellar infarction petechial hemorrhage (Heidelberg classification 1c: PH1), most apparent in the right cerebellar hemisphere. No significant posterior fossa mass effect or other complicating features. Recent bilateral cerebellar and brainstem infarcts with chronic supratentorial ischemia and no new intracranial abnormality identified.  1/15 started on hypertonic saline 1/16 family meeting with sister  1/18 repeat head CT - Extensive recent infratentorial infarcts with petechial hemorrhage at the right lower cerebellum. The fourth ventricle remains patent. Large remote right MCA distribution infarct.  1/19 No acute issues overnight  10/12/2022   Interim History / Subjective:  Tolerating PS this am Low dose Precedex  Objective:  Blood pressure (!) 110/51, pulse 77, temperature 99.5 F (37.5 C), temperature source Axillary, resp. rate (!) 22, height '5\' 9"'$  (1.753 m), weight 66.9 kg, SpO2 94 %.    Vent Mode: PSV;CPAP FiO2 (%):  [30 %-40 %] 40 % Set Rate:  [15 bmp] 15 bmp Vt Set:  [560 mL] 560 mL PEEP:  [5 cmH20] 5 cmH20 Pressure Support:  [10 cmH20] 10 cmH20 Plateau Pressure:  [18 cmH20-20 cmH20] 20 cmH20   Intake/Output Summary (Last 24 hours) at 2022/10/12 0865 Last data filed at October 12, 2022 0800 Gross  per 24 hour  Intake 1797.41 ml  Output 2550 ml  Net -752.59 ml   Filed Weights   09/17/22 0500 09/18/22 0500 09/27/22 0500  Weight: 71.6 kg 65.8 kg 66.9 kg   Physical Exam: General: Critically ill-appearing, no acute distress HENT: [place], AT, ETT in place Eyes: EOMI, no scleral icterus Respiratory: Clear to auscultation  bilaterally.  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 Discussed trach. Family struggling with decision. Remains full code Continue full vent support with lung protective strategies Head of bed elevated 30 degrees. Ensure adequate pulmonary hygiene  bronchodilators VAP bundle in place  PAD protocol, prn fentanyl and wean precedex for RASS -1  MSSA bacteremia -Cultures positive 1/13  P: Continue Cefazolin x4 weeks per ID   History of atrial fibrillation was on Eliquis P: IV heparin 1/19 resumed-   Heart failure with reduced ejection fraction -ECHO 1/15 with EF 20-25% with severally decreased LV function and global hypokinesis  Hypertension Hyperlipidemia P: Continuous telemetry  Continue ASA and statin Strict intake and output  Lasix today for goal net even/negative. Reassess daily Continue Spironolactone and Entresto  Continue home coreg Home Imdur remain on hold   Diabetes P: Continue SSI increase to resistance scale  Increase TF coverage  Basal insulin  Hiccups P: Thorazine  Cephalic vein thrombosis P: IV heaprin 1/19, warm  compress  Hypokalemia  P: Supplement as needed  Trend bmet   Ongoing goals of care discussion  1/18: Patient is more aware and interactive today. I did ask him about being on the ventilator long-term and he does not want to be on the ventilator long-term. He did indicate that if we get him off the ventilator, does not want to be back on the ventilator even if it means he is going to die-conversation was in the presence of his nurse and the respiratory therapist. This was confirmed by Dr. Leonie Man- when he evaluated the patient as well 1/19 Sister at bedside discussion plan of care with palliative care. Introduced myself and gave a brief update. Sister is still struggling with making goals of care decision. She was leaning toward less aggressive plan of care yesterday but today is questioning that decision. Will continue to support family as goals of care are being discussed. Per Dr. Clydene Fake note later in the day pt and [name] wish to pursue trach/peg, remain full code.  Best practice (evaluated daily):  Diet/type: TF DVT prophylaxis: SCD GI prophylaxis: PPI Lines: N/A Foley:  External foley in place  Code Status:  full code Last date of multidisciplinary goals of care discussion: Per primary  Critical care:   The patient is critically ill with multiple organ systems failure and requires high complexity decision making for assessment and support, frequent evaluation  and titration of therapies, application of advanced monitoring technologies and extensive interpretation of multiple databases.  Independent Critical Care Time: 35 Minutes.   Rodman Pickle, M.D. [facility] Pulmonary/Critical Care Medicine 10-15-22 9:03 AM   Please see Amion for pager number to reach on-call Pulmonary and Critical Care Team.

## 2022-09-29 DEATH — deceased

## 2022-10-06 ENCOUNTER — Telehealth: Payer: Medicare Other | Admitting: Internal Medicine
